# Patient Record
Sex: Male | Born: 1940
Health system: Southern US, Community
[De-identification: ages and names within clinical notes are randomized; demographics above are authoritative.]

## PROBLEM LIST (undated history)

## (undated) DIAGNOSIS — K529 Noninfective gastroenteritis and colitis, unspecified: Secondary | ICD-10-CM

## (undated) DIAGNOSIS — N400 Enlarged prostate without lower urinary tract symptoms: Secondary | ICD-10-CM

## (undated) DIAGNOSIS — Z85828 Personal history of other malignant neoplasm of skin: Secondary | ICD-10-CM

## (undated) DIAGNOSIS — G629 Polyneuropathy, unspecified: Secondary | ICD-10-CM

## (undated) DIAGNOSIS — K52831 Collagenous colitis: Secondary | ICD-10-CM

## (undated) DIAGNOSIS — R6521 Severe sepsis with septic shock: Secondary | ICD-10-CM

## (undated) DIAGNOSIS — N529 Male erectile dysfunction, unspecified: Secondary | ICD-10-CM

## (undated) DIAGNOSIS — G5603 Carpal tunnel syndrome, bilateral upper limbs: Secondary | ICD-10-CM

## (undated) DIAGNOSIS — Z973 Presence of spectacles and contact lenses: Secondary | ICD-10-CM

## (undated) DIAGNOSIS — N420 Calculus of prostate: Secondary | ICD-10-CM

## (undated) DIAGNOSIS — I7300 Raynaud's syndrome without gangrene: Secondary | ICD-10-CM

## (undated) DIAGNOSIS — Z860101 Personal history of adenomatous and serrated colon polyps: Secondary | ICD-10-CM

## (undated) DIAGNOSIS — R21 Rash and other nonspecific skin eruption: Secondary | ICD-10-CM

## (undated) DIAGNOSIS — R3911 Hesitancy of micturition: Secondary | ICD-10-CM

## (undated) DIAGNOSIS — Z8619 Personal history of other infectious and parasitic diseases: Secondary | ICD-10-CM

## (undated) DIAGNOSIS — R399 Unspecified symptoms and signs involving the genitourinary system: Secondary | ICD-10-CM

## (undated) DIAGNOSIS — D649 Anemia, unspecified: Secondary | ICD-10-CM

## (undated) DIAGNOSIS — J189 Pneumonia, unspecified organism: Secondary | ICD-10-CM

## (undated) DIAGNOSIS — J9600 Acute respiratory failure, unspecified whether with hypoxia or hypercapnia: Secondary | ICD-10-CM

## (undated) DIAGNOSIS — M1990 Unspecified osteoarthritis, unspecified site: Secondary | ICD-10-CM

## (undated) DIAGNOSIS — E119 Type 2 diabetes mellitus without complications: Secondary | ICD-10-CM

## (undated) DIAGNOSIS — Z86010 Personal history of colonic polyps: Secondary | ICD-10-CM

## (undated) DIAGNOSIS — A419 Sepsis, unspecified organism: Secondary | ICD-10-CM

## (undated) DIAGNOSIS — R234 Changes in skin texture: Secondary | ICD-10-CM

## (undated) DIAGNOSIS — Z8701 Personal history of pneumonia (recurrent): Secondary | ICD-10-CM

## (undated) DIAGNOSIS — C9110 Chronic lymphocytic leukemia of B-cell type not having achieved remission: Secondary | ICD-10-CM

## (undated) DIAGNOSIS — Z794 Long term (current) use of insulin: Secondary | ICD-10-CM

## (undated) DIAGNOSIS — D539 Nutritional anemia, unspecified: Secondary | ICD-10-CM

## (undated) DIAGNOSIS — D472 Monoclonal gammopathy: Secondary | ICD-10-CM

## (undated) DIAGNOSIS — D72829 Elevated white blood cell count, unspecified: Secondary | ICD-10-CM

## (undated) HISTORY — DX: Sepsis, unspecified organism: A41.9

## (undated) HISTORY — PX: INGUINAL HERNIA REPAIR: SUR1180

## (undated) HISTORY — DX: Sepsis, unspecified organism: R65.21

## (undated) HISTORY — PX: PENILE PROSTHESIS IMPLANT: SHX240

## (undated) HISTORY — PX: BONE MARROW BIOPSY: SHX199

## (undated) HISTORY — PX: CARDIOVASCULAR STRESS TEST: SHX262

## (undated) HISTORY — PX: TONSILLECTOMY: SUR1361

---

## 2007-08-08 HISTORY — PX: TRANSURETHRAL RESECTION OF PROSTATE: SHX73

## 2009-08-10 DIAGNOSIS — C4492 Squamous cell carcinoma of skin, unspecified: Secondary | ICD-10-CM

## 2009-08-10 HISTORY — DX: Squamous cell carcinoma of skin, unspecified: C44.92

## 2010-10-17 ENCOUNTER — Emergency Department (HOSPITAL_COMMUNITY): Payer: Medicare Other

## 2010-10-17 ENCOUNTER — Emergency Department (HOSPITAL_COMMUNITY)
Admission: EM | Admit: 2010-10-17 | Discharge: 2010-10-17 | Disposition: A | Discharge status: Home / Self Care | Payer: Medicare Other | Attending: Emergency Medicine | Admitting: Emergency Medicine

## 2010-10-17 DIAGNOSIS — S99929A Unspecified injury of unspecified foot, initial encounter: Secondary | ICD-10-CM | POA: Insufficient documentation

## 2010-10-17 DIAGNOSIS — M25473 Effusion, unspecified ankle: Secondary | ICD-10-CM | POA: Insufficient documentation

## 2010-10-17 DIAGNOSIS — E119 Type 2 diabetes mellitus without complications: Secondary | ICD-10-CM | POA: Insufficient documentation

## 2010-10-17 DIAGNOSIS — Y92009 Unspecified place in unspecified non-institutional (private) residence as the place of occurrence of the external cause: Secondary | ICD-10-CM | POA: Insufficient documentation

## 2010-10-17 DIAGNOSIS — M25579 Pain in unspecified ankle and joints of unspecified foot: Secondary | ICD-10-CM | POA: Insufficient documentation

## 2010-10-17 DIAGNOSIS — S8263XA Displaced fracture of lateral malleolus of unspecified fibula, initial encounter for closed fracture: Secondary | ICD-10-CM | POA: Insufficient documentation

## 2010-10-17 DIAGNOSIS — X500XXA Overexertion from strenuous movement or load, initial encounter: Secondary | ICD-10-CM | POA: Insufficient documentation

## 2010-10-17 DIAGNOSIS — S8990XA Unspecified injury of unspecified lower leg, initial encounter: Secondary | ICD-10-CM | POA: Insufficient documentation

## 2010-10-17 DIAGNOSIS — Z79899 Other long term (current) drug therapy: Secondary | ICD-10-CM | POA: Insufficient documentation

## 2010-10-17 DIAGNOSIS — M25476 Effusion, unspecified foot: Secondary | ICD-10-CM | POA: Insufficient documentation

## 2010-10-17 IMAGING — CR DG ANKLE COMPLETE 3+V*R*
3 series · 3 of 3 positions shown · non-contrast
Comparison: None.

CLINICAL DATA: Lateral ankle pain secondary to a fall today.

RIGHT ANKLE - COMPLETE 3+ VIEW

[t ankle joint ap right]
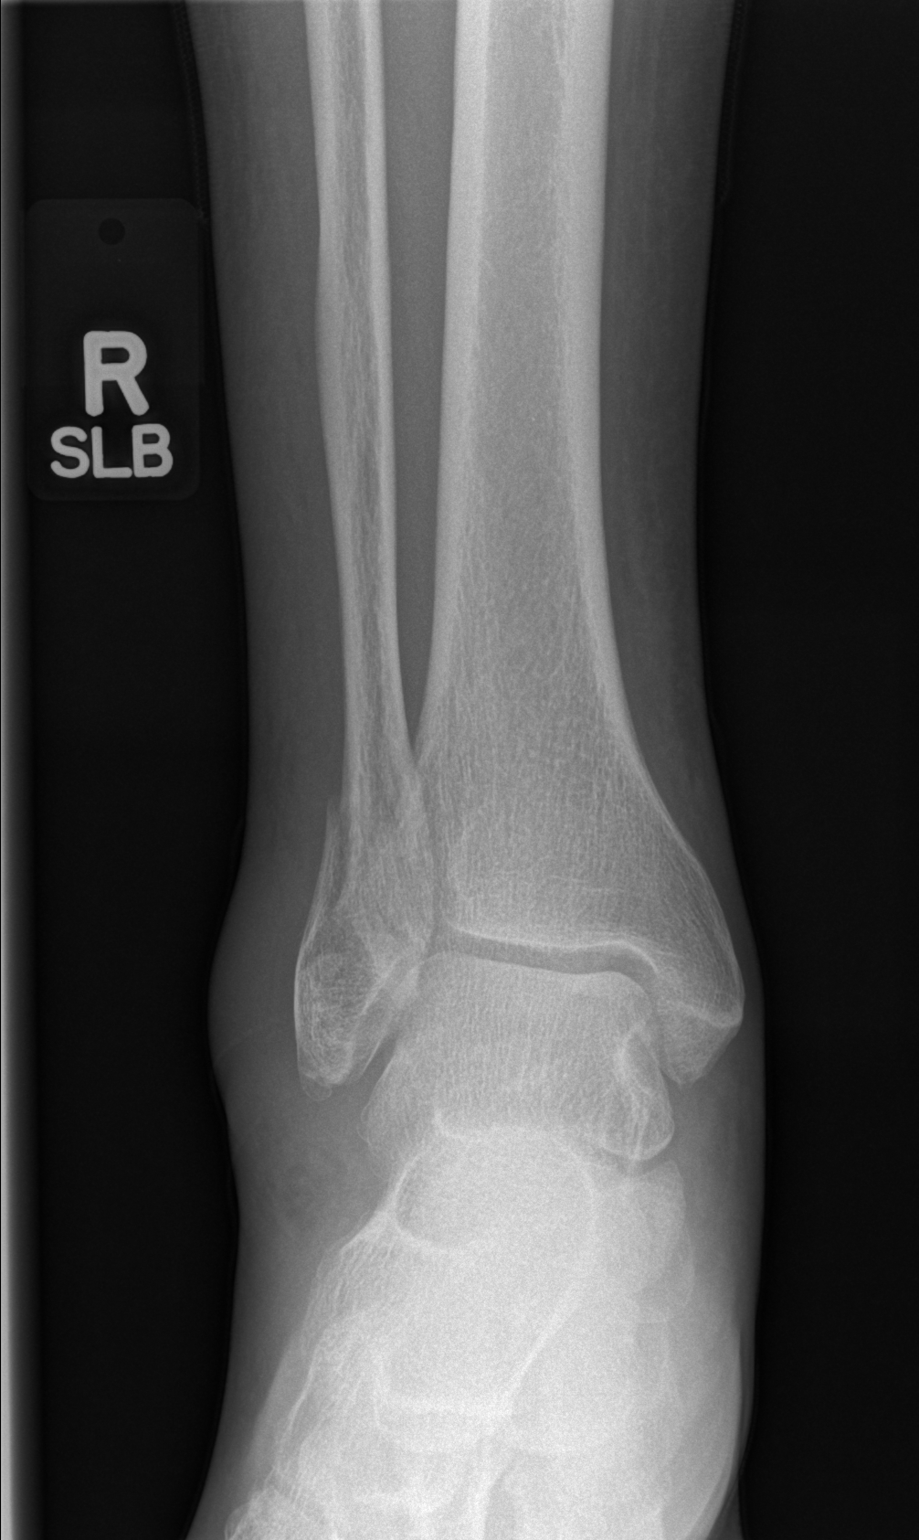

[t ankle joint oblique right]
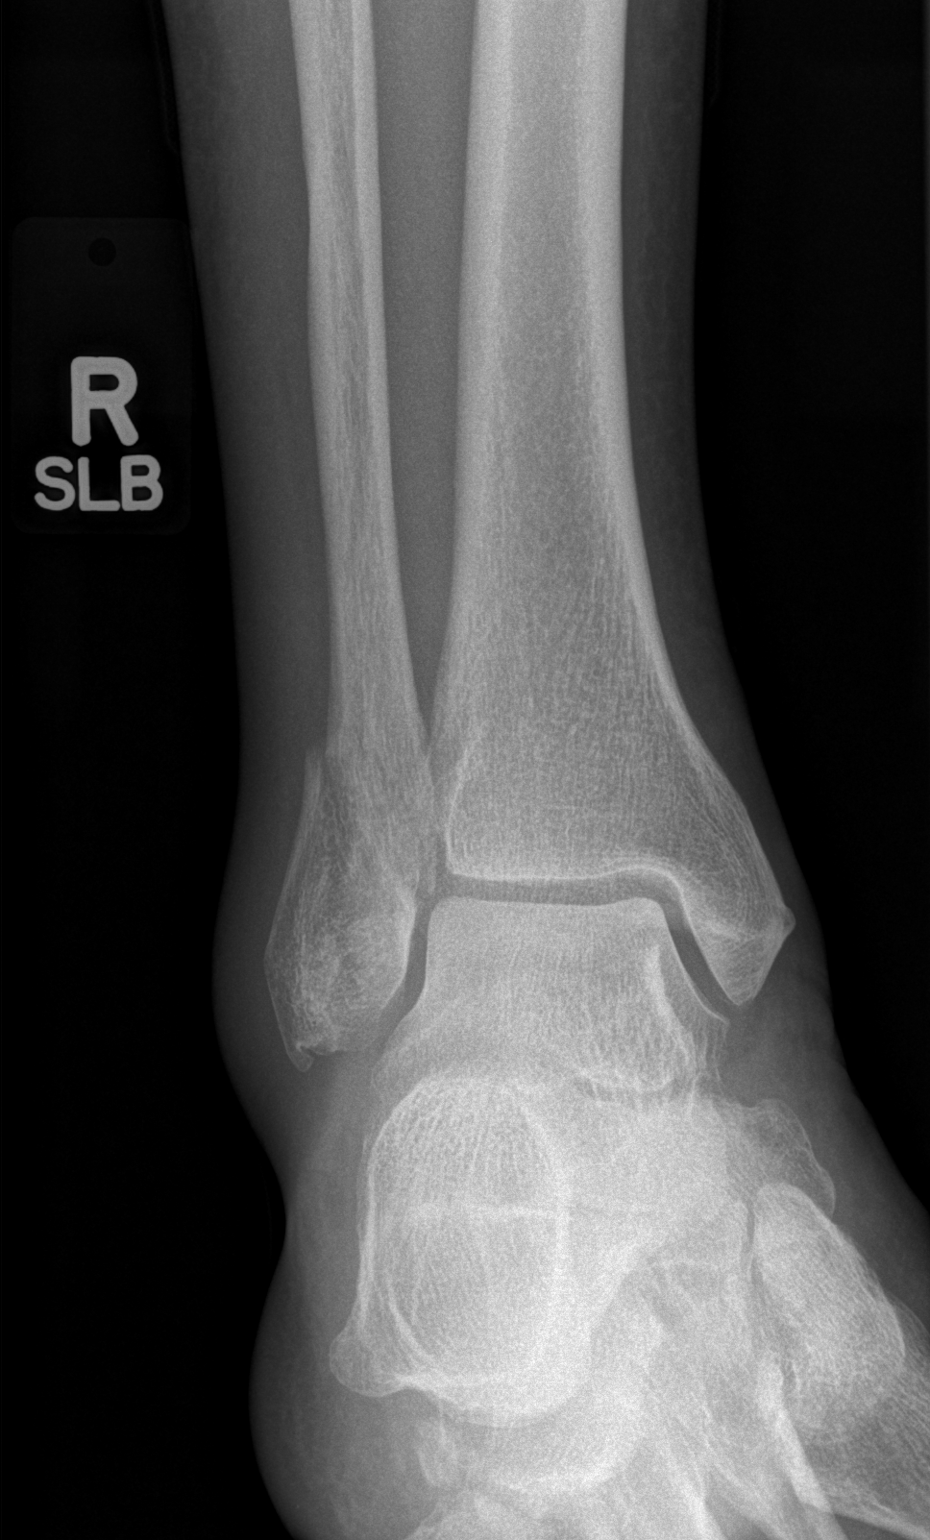

[t ankle joint lat right]
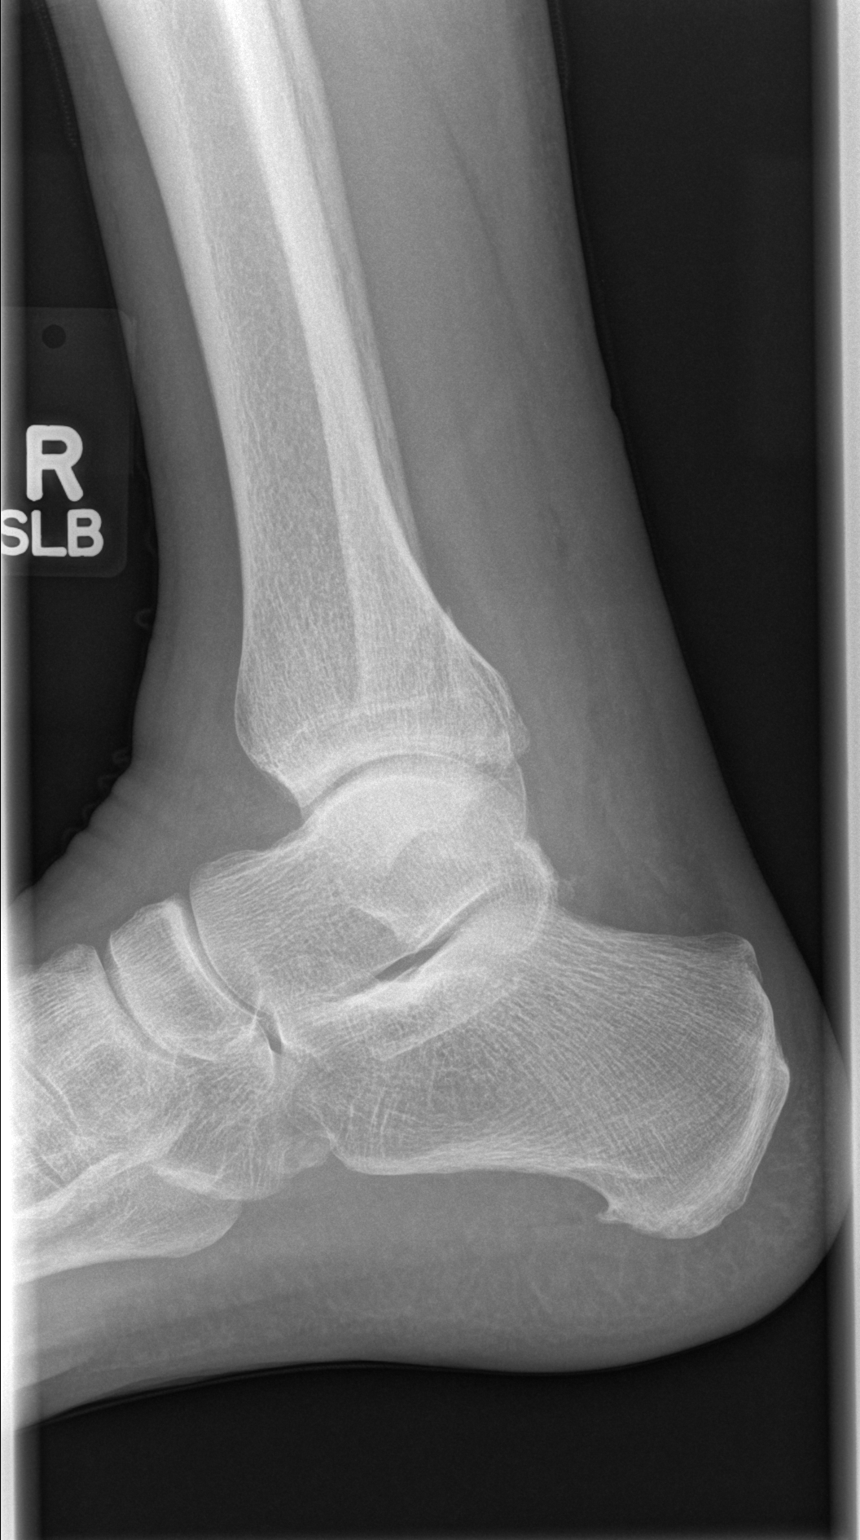

[3 of 3 positions shown; findings below may reference images not displayed]

FINDINGS: There is a slightly displaced spiral fracture of the
distal fibula.  There is an ankle effusion with soft tissue
swelling.  The distal tibia is intact.
IMPRESSION: Slightly displaced fracture of the lateral malleolus.

## 2011-08-29 DIAGNOSIS — J309 Allergic rhinitis, unspecified: Secondary | ICD-10-CM | POA: Insufficient documentation

## 2011-12-08 DIAGNOSIS — D099 Carcinoma in situ, unspecified: Secondary | ICD-10-CM

## 2011-12-08 HISTORY — DX: Carcinoma in situ, unspecified: D09.9

## 2012-03-25 ENCOUNTER — Ambulatory Visit (INDEPENDENT_AMBULATORY_CARE_PROVIDER_SITE_OTHER): Payer: Medicare Other | Admitting: Family Medicine

## 2012-03-25 VITALS — BP 120/70 | HR 72 | Temp 98.6°F | Resp 16 | Ht 69.5 in | Wt 176.0 lb

## 2012-03-25 DIAGNOSIS — T1590XA Foreign body on external eye, part unspecified, unspecified eye, initial encounter: Secondary | ICD-10-CM

## 2012-03-25 DIAGNOSIS — S0500XA Injury of conjunctiva and corneal abrasion without foreign body, unspecified eye, initial encounter: Secondary | ICD-10-CM

## 2012-03-25 MED ORDER — OFLOXACIN 0.3 % OP SOLN: OPHTHALMIC | Status: DC

## 2012-03-25 NOTE — Patient Instructions (Addendum)
If worse recheck

## 2012-03-25 NOTE — Progress Notes (Signed)
Subjective: Patient had a foreign body in his eye about 4 days ago discontinued bother him he thinks he got an eyelash in there. His wife was. Yesterday. This a difficult time for him. Left eye keeps hurting so he came in today.  Objective: He is grieving his wife's death. We had a long talk about that.  His eyes are PERRLA. Fundi benign. Eyes are both a little injected. I mentioned that's from is grieving. He is limited was everted. Eyes were carefully examined. No foreign body could be seen. Extensively irrigated the. As fluorescein stain was done. There is a small abrasion of the conjunctiva laterally.  Assessment: Abrasion left conjunctiva. Status post probable foreign body in the eye Grief  Plan: Ofloxacin eyedrops. If symptoms persist he is to return for recheck return to the eye Dr.

## 2012-06-14 ENCOUNTER — Ambulatory Visit (INDEPENDENT_AMBULATORY_CARE_PROVIDER_SITE_OTHER): Payer: Medicare Other | Admitting: Family Medicine

## 2012-06-14 VITALS — BP 127/66 | HR 67 | Temp 97.6°F | Resp 16 | Ht 69.0 in | Wt 176.0 lb

## 2012-06-14 DIAGNOSIS — J029 Acute pharyngitis, unspecified: Secondary | ICD-10-CM

## 2012-06-14 MED ORDER — AMOXICILLIN 875 MG PO TABS: 875.0000 mg | ORAL_TABLET | Freq: Two times a day (BID) | ORAL | Status: DC

## 2012-06-14 NOTE — Patient Instructions (Addendum)

## 2012-06-14 NOTE — Progress Notes (Signed)
@[email or web address] @   Patient ID: Tommy Peterson MRN: [id number], DOB: [birth year], 71 y.o. Date of Encounter: 06/14/2012, 1:04 PM  Primary Physician: No primary provider on file.  Chief Complaint:  Chief Complaint  Patient presents with  . Sore Throat    Has been sick x 8 days but the ST is lingering    HPI: 71 y.o. year old male presents with 5 day history of sore throat. Subjective fever and chills. No cough, congestion, rhinorrhea, sinus pressure, otalgia, or headache. Normal hearing. No GI complaints. Able to swallow saliva, but hurts to do so. Decreased appetite secondary to sore throat.   Past Medical History  Diagnosis Date  . Basal cell cancer   . Diabetes mellitus without complication      Home Meds: Prior to Admission medications   Medication Sig Start Date End Date Taking? Authorizing Provider  citalopram (CELEXA) 40 MG tablet Take 20 mg by mouth daily.    Yes Historical Provider, MD  glipiZIDE (GLUCOTROL) 10 MG tablet Take 10 mg by mouth 2 (two) times daily.   Yes Historical Provider, MD  losartan (COZAAR) 100 MG tablet Take 100 mg by mouth daily.   Yes Historical Provider, MD  metFORMIN (GLUCOPHAGE) 500 MG tablet Take by mouth. 250 mg in the a.m, 500 mg at night   Yes Historical Provider, MD  simvastatin (ZOCOR) 40 MG tablet Take 40 mg by mouth every evening.   Yes Historical Provider, MD    Allergies: No Known Allergies  History   Social History  . Marital Status: Married    Spouse Name: N/A    Number of Children: N/A  . Years of Education: N/A   Occupational History  . Not on file.   Social History Main Topics  . Smoking status: Never Smoker   . Smokeless tobacco: Not on file  . Alcohol Use: No  . Drug Use: No  . Sexually Active: Not on file   Other Topics Concern  . Not on file   Social History Narrative  . No narrative on file     Review of Systems: Constitutional: negative for chills,night sweats or weight changes HEENT: see  above Cardiovascular: negative for chest pain or palpitations Respiratory: negative for hemoptysis, wheezing, or shortness of breath Abdominal: negative for abdominal pain, nausea, vomiting or diarrhea Dermatological: negative for rash Neurologic: negative for headache   Physical Exam: Blood pressure 127/66, pulse 67, temperature 97.6 F (36.4 C), resp. rate 16, height 5\' 9"  (1.753 m), weight 176 lb (79.833 kg), SpO2 96.00%., Body mass index is 25.99 kg/(m^2). General: Well developed, well nourished, in no acute distress. Head: Normocephalic, atraumatic, eyes without discharge, sclera non-icteric, nares are patent. Bilateral auditory canals clear, TM's are without perforation, pearly grey with reflective cone of light bilaterally. No sinus TTP. Oral cavity moist, dentition normal. Posterior pharynx with post nasal drip and mild erythema. No peritonsillar abscess or tonsillar exudate. Neck: Supple. No thyromegaly. Full ROM. No lymphadenopathy. Lungs: Clear bilaterally to auscultation without wheezes, rales, or rhonchi. Breathing is unlabored. Heart: RRR with S1 S2. No murmurs, rubs, or gallops appreciated. Abdomen: Soft, non-tender, non-distended with normoactive bowel sounds. No hepatomegaly. No rebound/guarding. No obvious abdominal masses. Msk:  Strength and tone normal for age. Extremities: No clubbing or cyanosis. No edema. Neuro: Alert and oriented X 3. Moves all extremities spontaneously. CNII-XII grossly in tact. Psych:  Responds to questions appropriately with a normal affect.   Labs:   ASSESSMENT AND PLAN:  71 y.o. year old male  with  - -Tylenol/Motrin prn -Rest/fluids -RTC precautions -RTC 3-5 days if no improvement  Signed, Elvina Sidle, MD 06/14/2012 1:04 PM

## 2012-06-16 LAB — CULTURE, GROUP A STREP: Organism ID, Bacteria: NORMAL

## 2012-06-24 ENCOUNTER — Ambulatory Visit (INDEPENDENT_AMBULATORY_CARE_PROVIDER_SITE_OTHER): Payer: Medicare Other | Admitting: Family Medicine

## 2012-06-24 VITALS — BP 112/78 | HR 67 | Temp 98.1°F | Resp 16 | Ht 69.5 in | Wt 178.0 lb

## 2012-06-24 DIAGNOSIS — J029 Acute pharyngitis, unspecified: Secondary | ICD-10-CM

## 2012-06-24 DIAGNOSIS — J329 Chronic sinusitis, unspecified: Secondary | ICD-10-CM

## 2012-06-24 DIAGNOSIS — R0982 Postnasal drip: Secondary | ICD-10-CM

## 2012-06-24 MED ORDER — FLUTICASONE PROPIONATE 50 MCG/ACT NA SUSP: 2.0000 | Freq: Every day | NASAL | Status: DC

## 2012-06-24 NOTE — Progress Notes (Signed)
Subjective: Patient was here 10 days ago and treated by Dr. Elbert Ewings for a sore throat. He was given amoxicillin 875 twice daily. He has completed the medicine but continues to have symptoms. He has a slight cough. He does need some. He does not smoke. No fever.  Objective: Wears bilateral hearing aids. His TMs are normal on the left, some central thinning of the drum on the right. Throat mild erythema down the midline. Strep screen and culture were taken. Neck supple with small nodes. Chest is clear to auscultation. He has a little tenderness in the back of his neck. Chest is clear to auscultation.  Assessment: Pharyngitis  Plan: Strep screen and throat culture Results for orders placed in visit on 06/24/12  POCT RAPID STREP A (OFFICE)      Component Value Range   Rapid Strep A Screen Negative  Negative   Prolonged pharyngitis, probably either from allergy or a viral infection. The throat culture is pending. Will give him some fluticasone nose spray. If that does not help will probably want to treat him with a taper of prednisone. Might consider a Z-Pak also that point but this is not seem to be a bacterial infection.

## 2012-06-24 NOTE — Patient Instructions (Signed)
Fluids  Nose Spray  Let me know if not improving by Friday

## 2012-06-26 LAB — CULTURE, GROUP A STREP

## 2012-07-18 DIAGNOSIS — F3342 Major depressive disorder, recurrent, in full remission: Secondary | ICD-10-CM | POA: Insufficient documentation

## 2013-06-24 ENCOUNTER — Ambulatory Visit (INDEPENDENT_AMBULATORY_CARE_PROVIDER_SITE_OTHER): Payer: Medicare Other | Admitting: Internal Medicine

## 2013-06-24 VITALS — BP 126/78 | HR 72 | Temp 98.2°F | Resp 17 | Ht 69.5 in | Wt 176.0 lb

## 2013-06-24 DIAGNOSIS — J029 Acute pharyngitis, unspecified: Secondary | ICD-10-CM

## 2013-06-24 DIAGNOSIS — E119 Type 2 diabetes mellitus without complications: Secondary | ICD-10-CM

## 2013-06-24 MED ORDER — AZITHROMYCIN 250 MG PO TABS: ORAL_TABLET | ORAL | Status: DC

## 2013-06-24 NOTE — Patient Instructions (Signed)
Fever, Adult A fever is a higher than normal body temperature. In an adult, an oral temperature around 98.6 F (37 C) is considered normal. A temperature of 100.4 F (38 C) or higher is generally considered a fever. Mild or moderate fevers generally have no long-term effects and often do not require treatment. Extreme fever (greater than or equal to 106 F or 41.1 C) can cause seizures. The sweating that may occur with repeated or prolonged fever may cause dehydration. Elderly people can develop confusion during a fever. A measured temperature can vary with:  Age.  Time of day.  Method of measurement (mouth, underarm, rectal, or ear). The fever is confirmed by taking a temperature with a thermometer. Temperatures can be taken different ways. Some methods are accurate and some are not.  An oral temperature is used most commonly. Electronic thermometers are fast and accurate.  An ear temperature will only be accurate if the thermometer is positioned as recommended by the manufacturer.  A rectal temperature is accurate and done for those adults who have a condition where an oral temperature cannot be taken.  An underarm (axillary) temperature is not accurate and not recommended. Fever is a symptom, not a disease.  CAUSES   Infections commonly cause fever.  Some noninfectious causes for fever include:  Some arthritis conditions.  Some thyroid or adrenal gland conditions.  Some immune system conditions.  Some types of cancer.  A medicine reaction.  High doses of certain street drugs such as methamphetamine.  Dehydration.  Exposure to high outside or room temperatures.  Occasionally, the source of a fever cannot be determined. This is sometimes called a "fever of unknown origin" (FUO).  Some situations may lead to a temporary rise in body temperature that may go away on its own. Examples are:  Childbirth.  Surgery.  Intense exercise. HOME CARE INSTRUCTIONS   Take  appropriate medicines for fever. Follow dosing instructions carefully. If you use acetaminophen to reduce the fever, be careful to avoid taking other medicines that also contain acetaminophen. Do not take aspirin for a fever if you are younger than age 19. There is an association with Reye's syndrome. Reye's syndrome is a rare but potentially deadly disease.  If an infection is present and antibiotics have been prescribed, take them as directed. Finish them even if you start to feel better.  Rest as needed.  Maintain an adequate fluid intake. To prevent dehydration during an illness with prolonged or recurrent fever, you may need to drink extra fluid.Drink enough fluids to keep your urine clear or pale yellow.  Sponging or bathing with room temperature water may help reduce body temperature. Do not use ice water or alcohol sponge baths.  Dress comfortably, but do not over-bundle. SEEK MEDICAL CARE IF:   You are unable to keep fluids down.  You develop vomiting or diarrhea.  You are not feeling at least partly better after 3 days.  You develop new symptoms or problems. SEEK IMMEDIATE MEDICAL CARE IF:   You have shortness of breath or trouble breathing.  You develop excessive weakness.  You are dizzy or you faint.  You are extremely thirsty or you are making little or no urine.  You develop new pain that was not there before (such as in the head, neck, chest, back, or abdomen).  You have persistant vomiting and diarrhea for more than 1 to 2 days.  You develop a stiff neck or your eyes become sensitive to light.  You develop a   skin rash.  You have a fever or persistent symptoms for more than 2 to 3 days.  You have a fever and your symptoms suddenly get worse. MAKE SURE YOU:   Understand these instructions.  Will watch your condition.  Will get help right away if you are not doing well or get worse. Document Released: 01/17/2001 Document Revised: 10/16/2011 Document  Reviewed: 05/25/2011 [facility] Patient Information 2014 [place], [place]. Viral and Bacterial Pharyngitis Pharyngitis is soreness (inflammation) or infection of the pharynx. It is also called a sore throat. CAUSES  Most sore throats are caused by viruses and are part of a cold. However, some sore throats are caused by strep and other bacteria. Sore throats can also be caused by post nasal drip from draining sinuses, allergies and sometimes from sleeping with an open mouth. Infectious sore throats can be spread from person to person by coughing, sneezing and sharing cups or eating utensils. TREATMENT  Sore throats that are viral usually last 3-4 days. Viral illness will get better without medications (antibiotics). Strep throat and other bacterial infections will usually begin to get better about 24-48 hours after you begin to take antibiotics. HOME CARE INSTRUCTIONS   If the caregiver feels there is a bacterial infection or if there is a positive strep test, they will prescribe an antibiotic. The full course of antibiotics must be taken. If the full course of antibiotic is not taken, you or your child may become ill again. If you or your child has strep throat and do not finish all of the medication, serious heart or kidney diseases may develop.  Drink enough water and fluids to keep your urine clear or pale yellow.  Only take over-the-counter or prescription medicines for pain, discomfort or fever as directed by your caregiver.  Get lots of rest.  Gargle with salt water ( tsp. of salt in a glass of water) as often as every 1-2 hours as you need for comfort.  Hard candies may soothe the throat if individual is not at risk for choking. Throat sprays or lozenges may also be used. SEEK MEDICAL CARE IF:   Large, tender lumps in the neck develop.  A rash develops.  Green, yellow-brown or bloody sputum is coughed up.  Your baby is older than 3 months with a rectal temperature of 100.5 F (38.1  C) or higher for more than 1 day. SEEK IMMEDIATE MEDICAL CARE IF:   A stiff neck develops.  You or your child are drooling or unable to swallow liquids.  You or your child are vomiting, unable to keep medications or liquids down.  You or your child has severe pain, unrelieved with recommended medications.  You or your child are having difficulty breathing (not due to stuffy nose).  You or your child are unable to fully open your mouth.  You or your child develop redness, swelling, or severe pain anywhere on the neck.  You have a fever.  Your baby is older than 3 months with a rectal temperature of 102 F (38.9 C) or higher.  Your baby is 49 months old or younger with a rectal temperature of 100.4 F (38 C) or higher. MAKE SURE YOU:   Understand these instructions.  Will watch your condition.  Will get help right away if you are not doing well or get worse. Document Released: 07/24/2005 Document Revised: 10/16/2011 Document Reviewed: 10/21/2007 [facility] Patient Information 2014 [place], [place].

## 2013-06-24 NOTE — Progress Notes (Deleted)
  Subjective:    Patient ID: Tommy Peterson, male    DOB: 11-30-1940, 72 y.o.   MRN: [id number]  HPI The patient present to the office complaining of Congestion, sore throat and coughing up just a little sputum. Feel his throat irritated. Has the Sx for 5 days.    Review of Systems     Objective:   Physical Exam        Assessment & Plan:

## 2013-06-24 NOTE — Progress Notes (Signed)
  Subjective:    Patient ID: Tommy Peterson, male    DOB: [birth year], 72 y.o.   MRN: [id number]  HPI Has st, daughter 9 sick with sore throat, fever, cough, body aches. He has no other sxs. Diabetes is controlled.   Review of Systems     Objective:   Physical Exam  Vitals reviewed. Constitutional: He is oriented to person, place, and time. He appears well-developed and well-nourished. No distress.  HENT:  Head: Normocephalic.  Right Ear: External ear normal.  Left Ear: External ear normal.  Nose: Nose normal.  Mouth/Throat: Posterior oropharyngeal erythema present.  Neck: Normal range of motion. Neck supple. No thyromegaly present.  Cardiovascular: Normal rate, regular rhythm and normal heart sounds.   Pulmonary/Chest: Effort normal and breath sounds normal.  Lymphadenopathy:    He has cervical adenopathy.  Neurological: He is alert and oriented to person, place, and time. No cranial nerve deficit. He exhibits normal muscle tone. Coordination normal.  Psychiatric: He has a normal mood and affect.          Assessment & Plan:  Pharyngitis Zpak

## 2013-06-28 ENCOUNTER — Ambulatory Visit (INDEPENDENT_AMBULATORY_CARE_PROVIDER_SITE_OTHER): Payer: Medicare Other | Admitting: Family Medicine

## 2013-06-28 ENCOUNTER — Ambulatory Visit: Payer: Medicare Other

## 2013-06-28 VITALS — BP 142/72 | HR 95 | Temp 100.0°F | Resp 17 | Ht 70.0 in | Wt 175.6 lb

## 2013-06-28 DIAGNOSIS — R509 Fever, unspecified: Secondary | ICD-10-CM

## 2013-06-28 DIAGNOSIS — R059 Cough, unspecified: Secondary | ICD-10-CM

## 2013-06-28 DIAGNOSIS — R05 Cough: Secondary | ICD-10-CM

## 2013-06-28 DIAGNOSIS — J189 Pneumonia, unspecified organism: Secondary | ICD-10-CM

## 2013-06-28 LAB — POCT CBC
Hemoglobin: 12.1 g/dL — AB (ref 14.1–18.1)
MCH, POC: 31.5 pg — AB (ref 27–31.2)
MCV: 102.4 fL — AB (ref 80–97)
MID (cbc): 0.9 (ref 0–0.9)
MPV: 8.6 fL (ref 0–99.8)
RBC: 3.84 M/uL — AB (ref 4.69–6.13)
RDW, POC: 16.1 %
WBC: 11.9 10*3/uL — AB (ref 4.6–10.2)

## 2013-06-28 LAB — POCT INFLUENZA A/B
Influenza A, POC: NEGATIVE
Influenza B, POC: NEGATIVE

## 2013-06-28 IMAGING — CR DG CHEST 2V
2 series · 2 of 2 positions shown · non-contrast
Comparison: None.

CLINICAL DATA: Fever, cough.

EXAM:
CHEST  2 VIEW

[PA]
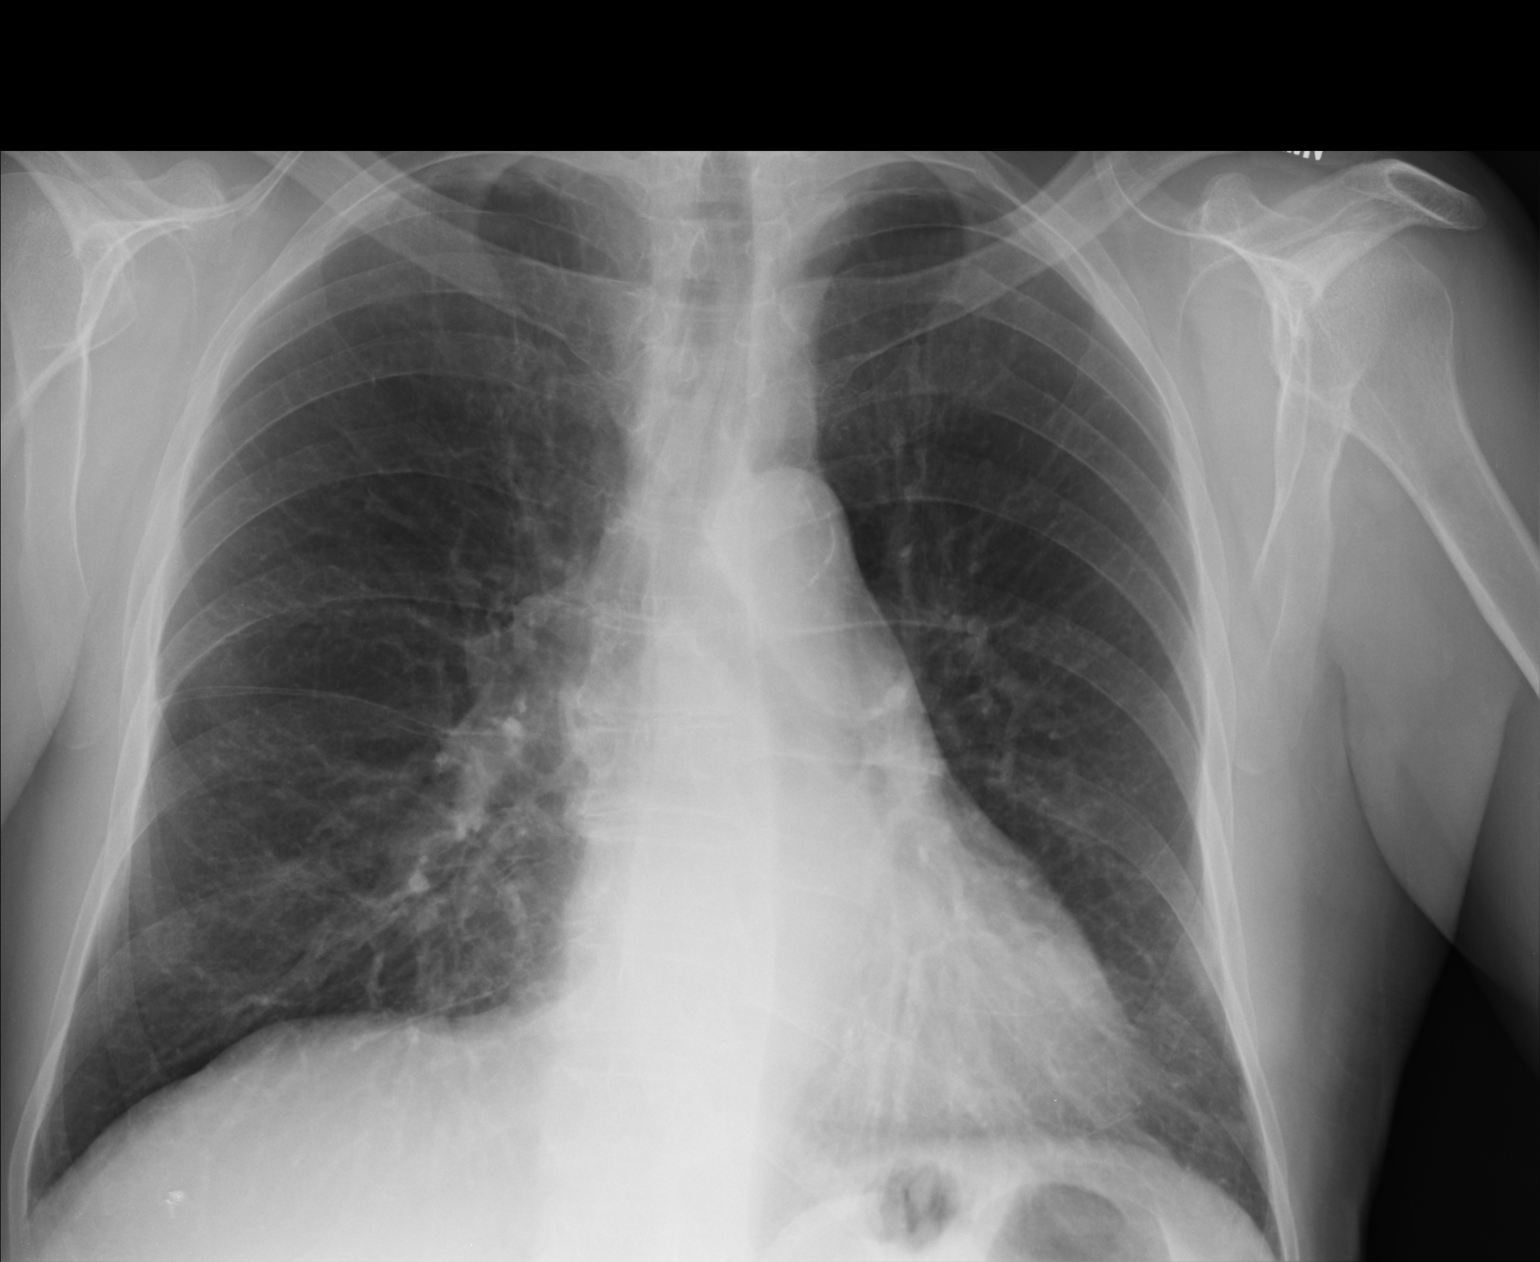

[lateral]
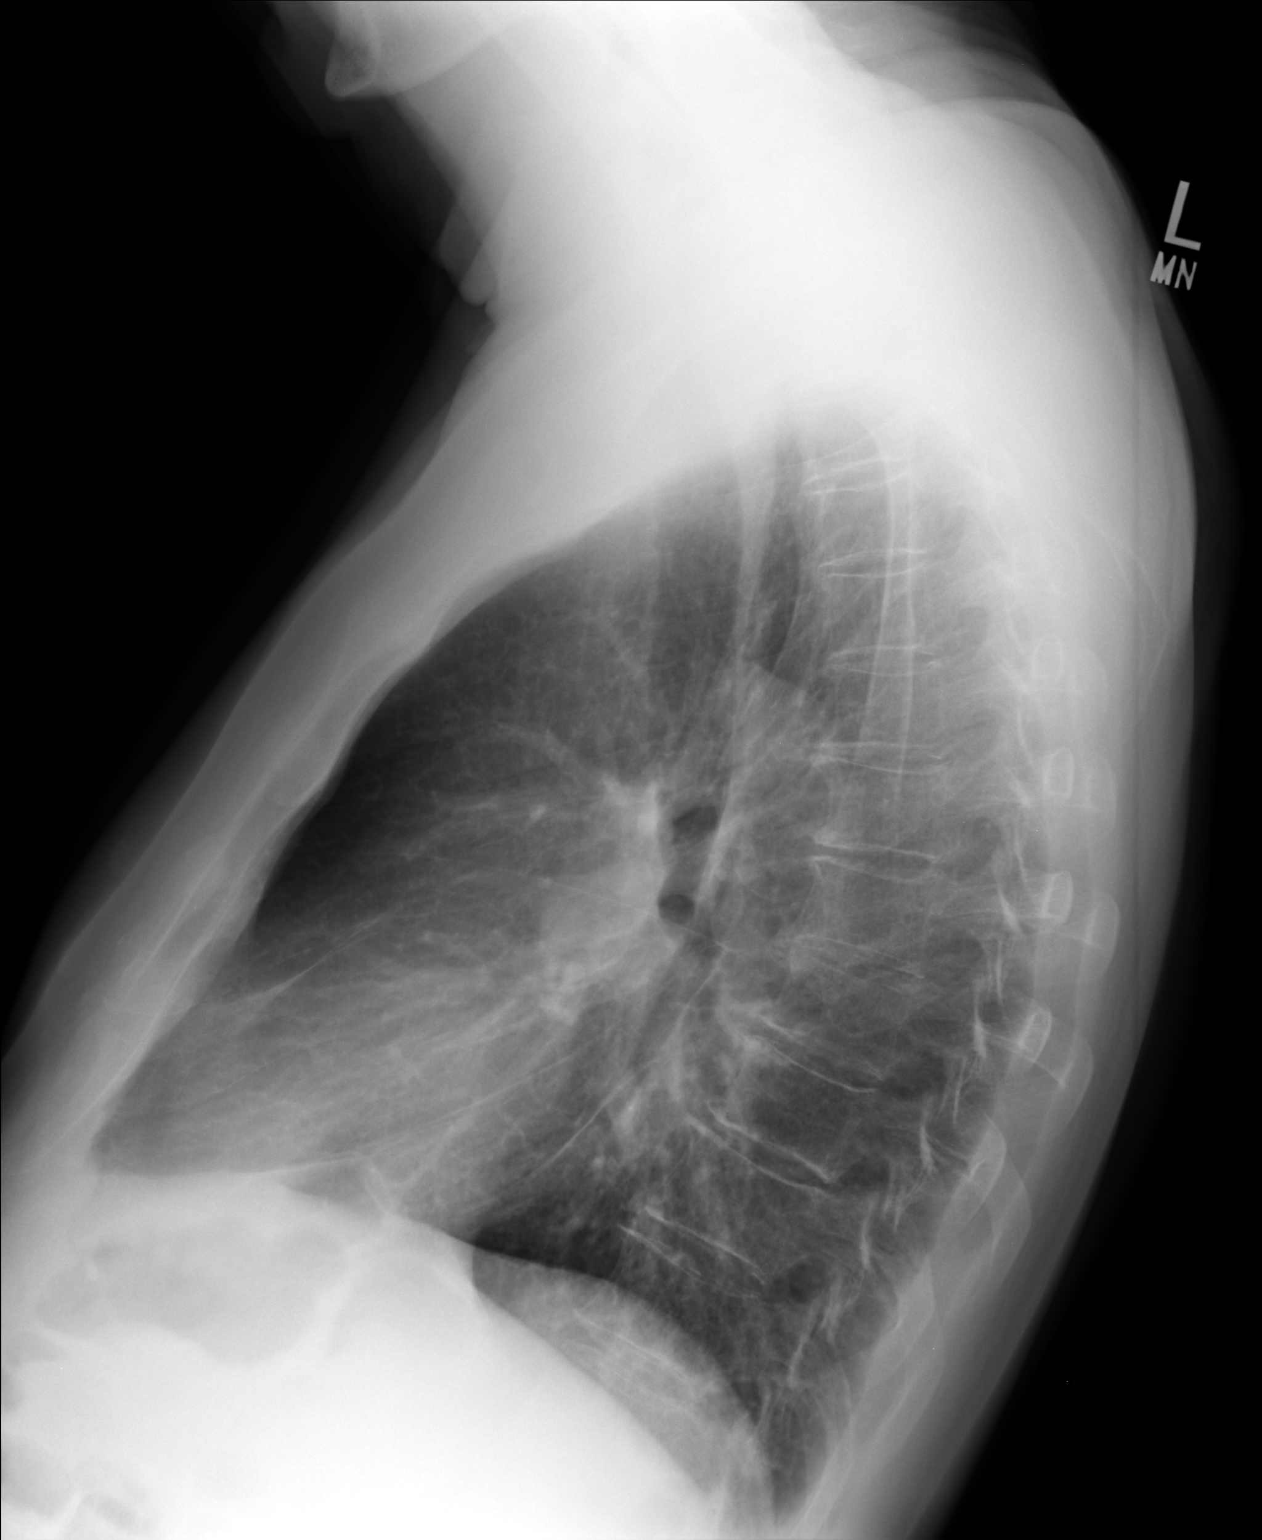

[2 of 2 positions shown; findings below may reference images not displayed]

FINDINGS: The heart size and mediastinal contours are within normal limits.
Both lungs are clear. The visualized skeletal structures are
unremarkable.
IMPRESSION: No active cardiopulmonary disease.

## 2013-06-28 MED ORDER — LEVOFLOXACIN 500 MG PO TABS: 500.0000 mg | ORAL_TABLET | Freq: Every day | ORAL | Status: DC

## 2013-06-28 NOTE — Progress Notes (Addendum)
Subjective:   This chart was scribed for Theda Belfast, by Ladona Ridgel Day, ED scribe. This patient was seen in room 2 and the patient's care was started at 1025 AM.   Patient ID: Tommy Peterson, male    DOB: [birth year], 72 y.o.   MRN: [id number]  HPI Tommy Peterson is a 72 y.o. male PCP: No primary provider on file. - PCP is Dr.Thomas Lady Gary in [place]  Today Tommy Peterson presents to the [facility] clinic for a recheck of worsened URI after he was seen 4 days ago by Dr. Perrin Maltese, dx w/pharyngitis and placed on Z-pak.   From his last visit, he had been sick 4 days prior (8 days total since onset) w/a sore throat, dry cough, congestion, no myalgias and had no fever at that time. Felt better first few days post visit, but yesterday worse w/cough and began w/body aches. Fever yesterday 99.5 F and subjective fever past couple of days. His daughter is sick w/similar symptoms.  He took Nyquil once at home to help him sleep, no other OTC medicines. He denies SOB.  Month ago he had the flu and pneumonia vaccine.   Has been diagnosed with heart murmur in past, but no specific recommendations.   Patient Active Problem List   Diagnosis Date Noted  . Diabetes 06/24/2013   Past Medical History  Diagnosis Date  . Basal cell cancer   . Diabetes mellitus without complication   . Cataract    Past Surgical History  Procedure Laterality Date  . Hernia repair    . Prostate surgery    . Eye surgery     No Known Allergies Prior to Admission medications   Medication Sig Start Date End Date Taking? Authorizing Provider  azithromycin (ZITHROMAX) 250 MG tablet Use as directed 06/24/13  Yes Jonita Albee, MD  citalopram (CELEXA) 40 MG tablet Take 20 mg by mouth daily.    Yes Historical Provider, MD  fluticasone (FLONASE) 50 MCG/ACT nasal spray Place 2 sprays into the nose daily. 06/24/12  Yes Peyton Najjar, MD  glipiZIDE (GLUCOTROL) 10 MG tablet Take 10 mg by mouth 2 (two) times daily.   Yes  Historical Provider, MD  losartan (COZAAR) 100 MG tablet Take 100 mg by mouth daily.   Yes Historical Provider, MD  metFORMIN (GLUCOPHAGE) 500 MG tablet Take by mouth. 250 mg in the a.m, 500 mg at night   Yes Historical Provider, MD  pioglitazone (ACTOS) 15 MG tablet Take 15 mg by mouth daily.   Yes Historical Provider, MD  simvastatin (ZOCOR) 40 MG tablet Take 40 mg by mouth every evening.   Yes Historical Provider, MD   History   Social History  . Marital Status: Married    Spouse Name: N/A    Number of Children: N/A  . Years of Education: N/A   Occupational History  . Not on file.   Social History Main Topics  . Smoking status: Never Smoker   . Smokeless tobacco: Not on file  . Alcohol Use: No  . Drug Use: No  . Sexual Activity: No   Other Topics Concern  . Not on file   Social History Narrative  . No narrative on file     Review of Systems  Constitutional: Positive for fever and chills.  HENT: Positive for congestion, rhinorrhea, sinus pressure and sore throat. Negative for ear pain.   Respiratory: Positive for cough. Negative for shortness of breath.   Cardiovascular: Negative for chest pain.  Gastrointestinal:  Negative for vomiting, abdominal pain and diarrhea.  Musculoskeletal: Positive for myalgias.       Objective:   Physical Exam  Vitals reviewed. Constitutional: He is oriented to person, place, and time. He appears well-developed and well-nourished.  HENT:  Head: Normocephalic and atraumatic.  Right Ear: Tympanic membrane, external ear and ear canal normal.  Left Ear: Tympanic membrane, external ear and ear canal normal.  Nose: No rhinorrhea.  Mouth/Throat: Oropharynx is clear and moist and mucous membranes are normal. No oropharyngeal exudate or posterior oropharyngeal erythema.  Sinuses non-tender. Clear nasal discharge.   Eyes: Conjunctivae are normal. Pupils are equal, round, and reactive to light.  Neck: Neck supple.  Cardiovascular: Normal rate,  regular rhythm and intact distal pulses.   Murmur (Systolic murmur 2/6 at left upper sternum border. ) heard. Pulmonary/Chest: Effort normal and breath sounds normal. No respiratory distress. He has no wheezes. He has no rhonchi. He has no rales.  No respiratory distress. No retractions.   Abdominal: Soft. There is no tenderness.  Lymphadenopathy:    He has no cervical adenopathy.  Neurological: He is alert and oriented to person, place, and time.  Skin: Skin is warm and dry. No rash noted.  Psychiatric: He has a normal mood and affect. His behavior is normal.   Filed Vitals:   06/28/13 0948  BP: 142/72  Pulse: 95  Temp: 100 F (37.8 C)  TempSrc: Oral  Resp: 17  Height: 5\' 10"  (1.778 m)  Weight: 175 lb 9.6 oz (79.652 kg)  SpO2: 95%   Results for orders placed in visit on 06/28/13  POCT CBC      Result Value Range   WBC 11.9 (*) 4.6 - 10.2 K/uL   Lymph, poc 3.9 (*) 0.6 - 3.4   POC LYMPH PERCENT 32.4  10 - 50 %L   MID (cbc) 0.9  0 - 0.9   POC MID % 7.3  0 - 12 %M   POC Granulocyte 7.2 (*) 2 - 6.9   Granulocyte percent 60.3  37 - 80 %G   RBC 3.84 (*) 4.69 - 6.13 M/uL   Hemoglobin 12.1 (*) 14.1 - 18.1 g/dL   HCT, POC 16.1 (*) 09.6 - 53.7 %   MCV 102.4 (*) 80 - 97 fL   MCH, POC 31.5 (*) 27 - 31.2 pg   MCHC 30.8 (*) 31.8 - 35.4 g/dL   RDW, POC 04.5     Platelet Count, POC 189  142 - 424 K/uL   MPV 8.6  0 - 99.8 fL  POCT INFLUENZA A/B      Result Value Range   Influenza A, POC Negative     Influenza B, POC Negative     [facility] reading (PRIMARY) by  Dr. Neva Seat: CXR: RML vs RLL infiltrate      Assessment & Plan:   Tommy Peterson is a 72 y.o. male Fever, unspecified - Plan: POCT CBC, POCT Influenza A/B, DG Chest 2 View, levofloxacin (LEVAQUIN) 500 MG tablet  Cough - Plan: POCT CBC, POCT Influenza A/B, DG Chest 2 View, levofloxacin (LEVAQUIN) 500 MG tablet  Pneumonia - Plan: levofloxacin (LEVAQUIN) 500 MG tablet  Suspected inititla URI now with RML pneumonia after 5  days of azithro. Start LEvaquin. Discussed risks of QT prolongation and hypoglycemia wih precautions as below. Will decr celxa to 1/2 of usual dose (1/4 tablet QD), and hypoglycemic precautions. Has hydrocodone syrup at home if neededQHS prn, recheck in 3 days, RTC/ER precautions given for sooner if needed.  Meds ordered this encounter  Medications  . levofloxacin (LEVAQUIN) 500 MG tablet    Sig: Take 1 tablet (500 mg total) by mouth daily.    Dispense:  10 tablet    Refill:  0   Patient Instructions  You appear to have a pneumonia. As you have already taken the zpak, will need something stronger. Start Levaquin once per day. Decrease celexa to 1/4 tablet while taking Levaquin due to risk of heart arrythmia, and if you do have any lightheadedness, heart racing, or feeling worse - go to the ER.  Also be cautious of low blood sugar symptoms as below as Levaquin can make this more likely with one of your diabetes medicines.  Saline nasal spray atleast 4 times per day, over the counter mucinex or mucinex DM, drink plenty of fluids. Cough suppressant at night if needed and NOT short of breath. Recheck with Dr. Neva Seat Tuesday morning, but return sooner if not starting to improve into Monday morning. Return to the clinic or go to the nearest emergency room if any of your symptoms worsen or new symptoms occur. Pneumonia, Adult Pneumonia is an infection of the lungs.  CAUSES Pneumonia may be caused by bacteria or a virus. Usually, these infections are caused by breathing infectious particles into the lungs (respiratory tract). SYMPTOMS   Cough.  Fever.  Chest pain.  Increased rate of breathing.  Wheezing.  Mucus production. DIAGNOSIS  If you have the common symptoms of pneumonia, your caregiver will typically confirm the diagnosis with a chest X-ray. The X-ray will show an abnormality in the lung (pulmonary infiltrate) if you have pneumonia. Other tests of your blood, urine, or sputum may be  done to find the specific cause of your pneumonia. Your caregiver may also do tests (blood gases or pulse oximetry) to see how well your lungs are working. TREATMENT  Some forms of pneumonia may be spread to other people when you cough or sneeze. You may be asked to wear a mask before and during your exam. Pneumonia that is caused by bacteria is treated with antibiotic medicine. Pneumonia that is caused by the influenza virus may be treated with an antiviral medicine. Most other viral infections must run their course. These infections will not respond to antibiotics.  PREVENTION A pneumococcal shot (vaccine) is available to prevent a common bacterial cause of pneumonia. This is usually suggested for:  People over 38 years old.  Patients on chemotherapy.  People with chronic lung problems, such as bronchitis or emphysema.  People with immune system problems. If you are over 65 or have a high risk condition, you may receive the pneumococcal vaccine if you have not received it before. In some countries, a routine influenza vaccine is also recommended. This vaccine can help prevent some cases of pneumonia.You may be offered the influenza vaccine as part of your care. If you smoke, it is time to quit. You may receive instructions on how to stop smoking. Your caregiver can provide medicines and counseling to help you quit. HOME CARE INSTRUCTIONS   Cough suppressants may be used if you are losing too much rest. However, coughing protects you by clearing your lungs. You should avoid using cough suppressants if you can.  Your caregiver may have prescribed medicine if he or she thinks your pneumonia is caused by a bacteria or influenza. Finish your medicine even if you start to feel better.  Your caregiver may also prescribe an expectorant. This loosens the mucus to be coughed up.  Only take over-the-counter or prescription medicines for pain, discomfort, or fever as directed by your caregiver.  Do  not smoke. Smoking is a common cause of bronchitis and can contribute to pneumonia. If you are a smoker and continue to smoke, your cough may last several weeks after your pneumonia has cleared.  A cold steam vaporizer or humidifier in your room or home may help loosen mucus.  Coughing is often worse at night. Sleeping in a semi-upright position in a recliner or using a couple pillows under your head will help with this.  Get rest as you feel it is needed. Your body will usually let you know when you need to rest. SEEK IMMEDIATE MEDICAL CARE IF:   Your illness becomes worse. This is especially true if you are elderly or weakened from any other disease.  You cannot control your cough with suppressants and are losing sleep.  You begin coughing up blood.  You develop pain which is getting worse or is uncontrolled with medicines.  You have a fever.  Any of the symptoms which initially brought you in for treatment are getting worse rather than better.  You develop shortness of breath or chest pain. MAKE SURE YOU:   Understand these instructions.  Will watch your condition.  Will get help right away if you are not doing well or get worse. Document Released: 07/24/2005 Document Revised: 10/16/2011 Document Reviewed: 10/13/2010 [facility] Patient Information 2014 [employer], [place].  Hypoglycemia (Low Blood Sugar) Hypoglycemia is when the glucose (sugar) in your blood is too low. Hypoglycemia can happen for many reasons. It can happen to people with or without diabetes. Hypoglycemia can develop quickly and can be a medical emergency.  CAUSES  Having hypoglycemia does not mean that you will develop diabetes. Different causes include:  Missed or delayed meals or not enough carbohydrates eaten.  Medication overdose. This could be by accident or deliberate. If by accident, your medication may need to be adjusted or changed.  Exercise or increased activity without adjustments in carbohydrates  or medications.  A nerve disorder that affects body functions like your heart rate, blood pressure and digestion (autonomic neuropathy).  A condition where the stomach muscles do not function properly (gastroparesis). Therefore, medications may not absorb properly.  The inability to recognize the signs of hypoglycemia (hypoglycemic unawareness).  Absorption of insulin  may be altered.  Alcohol consumption.  Pregnancy/menstrual cycles/postpartum. This may be due to hormones.  Certain kinds of tumors. This is very rare. SYMPTOMS   Sweating.  Hunger.  Dizziness.  Blurred vision.  Drowsiness.  Weakness.  Headache.  Rapid heart beat.  Shakiness.  Nervousness. DIAGNOSIS  Diagnosis is made by monitoring blood glucose in one or all of the following ways:  Fingerstick blood glucose monitoring.  Laboratory results. TREATMENT  If you think your blood glucose is low:  Check your blood glucose, if possible. If it is less than 70 mg/dl, take one of the following:  3-4 glucose tablets.   cup juice (prefer clear like apple).   cup "regular" soda pop.  1 cup milk.  -1 tube of glucose gel.  5-6 hard candies.  Do not over treat because your blood glucose (sugar) will only go too high.  Wait 15 minutes and recheck your blood glucose. If it is still less than 70 mg/dl (or below your target range), repeat treatment.  Eat a snack if it is more than one hour until your next meal. Sometimes, your blood glucose may go so low that  you are unable to treat yourself. You may need someone to help you. You may even pass out or be unable to swallow. This may require you to get an injection of glucagon, which raises the blood glucose. HOME CARE INSTRUCTIONS  Check blood glucose as recommended by your caregiver.  Take medication as prescribed by your caregiver.  Follow your meal plan. Do not skip meals. Eat on time.  If you are going to drink alcohol, drink it only with  meals.  Check your blood glucose before driving.  Check your blood glucose before and after exercise. If you exercise longer or different than usual, be sure to check blood glucose more frequently.  Always carry treatment with you. Glucose tablets are the easiest to carry.  Always wear medical alert jewelry or carry some form of identification that states that you have diabetes. This will alert people that you have diabetes. If you have hypoglycemia, they will have a better idea on what to do. SEEK MEDICAL CARE IF:   You are having problems keeping your blood sugar at target range.  You are having frequent episodes of hypoglycemia.  You feel you might be having side effects from your medicines.  You have symptoms of an illness that is not improving after 3-4 days.  You notice a change in vision or a new problem with your vision. SEEK IMMEDIATE MEDICAL CARE IF:   You are a family member or friend of a person whose blood glucose goes below 70 mg/dl and is accompanied by:  Confusion.  A change in mental status.  The inability to swallow.  Passing out. Document Released: 07/24/2005 Document Revised: 10/16/2011 Document Reviewed: 11/20/2011 [facility] Patient Information 2014 [place], [place].     I personally performed the services described in this documentation, which was scribed in my presence. The recorded information has been reviewed and considered, and addended by me as needed.

## 2013-06-28 NOTE — Patient Instructions (Addendum)
You appear to have a pneumonia. As you have already taken the zpak, will need something stronger. Start Levaquin once per day. Decrease celexa to 1/4 tablet while taking Levaquin due to risk of heart arrythmia, and if you do have any lightheadedness, heart racing, or feeling worse - go to the ER.  Also be cautious of low blood sugar symptoms as below as Levaquin can make this more likely with one of your diabetes medicines.  Saline nasal spray atleast 4 times per day, over the counter mucinex or mucinex DM, drink plenty of fluids. Cough suppressant at night if needed and NOT short of breath. Recheck with Dr. Neva Seat Tuesday morning, but return sooner if not starting to improve into Monday morning. Return to the clinic or go to the nearest emergency room if any of your symptoms worsen or new symptoms occur. Pneumonia, Adult Pneumonia is an infection of the lungs.  CAUSES Pneumonia may be caused by bacteria or a virus. Usually, these infections are caused by breathing infectious particles into the lungs (respiratory tract). SYMPTOMS   Cough.  Fever.  Chest pain.  Increased rate of breathing.  Wheezing.  Mucus production. DIAGNOSIS  If you have the common symptoms of pneumonia, your caregiver will typically confirm the diagnosis with a chest X-ray. The X-ray will show an abnormality in the lung (pulmonary infiltrate) if you have pneumonia. Other tests of your blood, urine, or sputum may be done to find the specific cause of your pneumonia. Your caregiver may also do tests (blood gases or pulse oximetry) to see how well your lungs are working. TREATMENT  Some forms of pneumonia may be spread to other people when you cough or sneeze. You may be asked to wear a mask before and during your exam. Pneumonia that is caused by bacteria is treated with antibiotic medicine. Pneumonia that is caused by the influenza virus may be treated with an antiviral medicine. Most other viral infections must run their  course. These infections will not respond to antibiotics.  PREVENTION A pneumococcal shot (vaccine) is available to prevent a common bacterial cause of pneumonia. This is usually suggested for:  People over 89 years old.  Patients on chemotherapy.  People with chronic lung problems, such as bronchitis or emphysema.  People with immune system problems. If you are over 65 or have a high risk condition, you may receive the pneumococcal vaccine if you have not received it before. In some countries, a routine influenza vaccine is also recommended. This vaccine can help prevent some cases of pneumonia.You may be offered the influenza vaccine as part of your care. If you smoke, it is time to quit. You may receive instructions on how to stop smoking. Your caregiver can provide medicines and counseling to help you quit. HOME CARE INSTRUCTIONS   Cough suppressants may be used if you are losing too much rest. However, coughing protects you by clearing your lungs. You should avoid using cough suppressants if you can.  Your caregiver may have prescribed medicine if he or she thinks your pneumonia is caused by a bacteria or influenza. Finish your medicine even if you start to feel better.  Your caregiver may also prescribe an expectorant. This loosens the mucus to be coughed up.  Only take over-the-counter or prescription medicines for pain, discomfort, or fever as directed by your caregiver.  Do not smoke. Smoking is a common cause of bronchitis and can contribute to pneumonia. If you are a smoker and continue to smoke, your cough  may last several weeks after your pneumonia has cleared.  A cold steam vaporizer or humidifier in your room or home may help loosen mucus.  Coughing is often worse at night. Sleeping in a semi-upright position in a recliner or using a couple pillows under your head will help with this.  Get rest as you feel it is needed. Your body will usually let you know when you need to  rest. SEEK IMMEDIATE MEDICAL CARE IF:   Your illness becomes worse. This is especially true if you are elderly or weakened from any other disease.  You cannot control your cough with suppressants and are losing sleep.  You begin coughing up blood.  You develop pain which is getting worse or is uncontrolled with medicines.  You have a fever.  Any of the symptoms which initially brought you in for treatment are getting worse rather than better.  You develop shortness of breath or chest pain. MAKE SURE YOU:   Understand these instructions.  Will watch your condition.  Will get help right away if you are not doing well or get worse. Document Released: 07/24/2005 Document Revised: 10/16/2011 Document Reviewed: 10/13/2010 [facility] Patient Information 2014 [place], [place].  Hypoglycemia (Low Blood Sugar) Hypoglycemia is when the glucose (sugar) in your blood is too low. Hypoglycemia can happen for many reasons. It can happen to people with or without diabetes. Hypoglycemia can develop quickly and can be a medical emergency.  CAUSES  Having hypoglycemia does not mean that you will develop diabetes. Different causes include:  Missed or delayed meals or not enough carbohydrates eaten.  Medication overdose. This could be by accident or deliberate. If by accident, your medication may need to be adjusted or changed.  Exercise or increased activity without adjustments in carbohydrates or medications.  A nerve disorder that affects body functions like your heart rate, blood pressure and digestion (autonomic neuropathy).  A condition where the stomach muscles do not function properly (gastroparesis). Therefore, medications may not absorb properly.  The inability to recognize the signs of hypoglycemia (hypoglycemic unawareness).  Absorption of insulin  may be altered.  Alcohol consumption.  Pregnancy/menstrual cycles/postpartum. This may be due to hormones.  Certain kinds of tumors.  This is very rare. SYMPTOMS   Sweating.  Hunger.  Dizziness.  Blurred vision.  Drowsiness.  Weakness.  Headache.  Rapid heart beat.  Shakiness.  Nervousness. DIAGNOSIS  Diagnosis is made by monitoring blood glucose in one or all of the following ways:  Fingerstick blood glucose monitoring.  Laboratory results. TREATMENT  If you think your blood glucose is low:  Check your blood glucose, if possible. If it is less than 70 mg/dl, take one of the following:  3-4 glucose tablets.   cup juice (prefer clear like apple).   cup "regular" soda pop.  1 cup milk.  -1 tube of glucose gel.  5-6 hard candies.  Do not over treat because your blood glucose (sugar) will only go too high.  Wait 15 minutes and recheck your blood glucose. If it is still less than 70 mg/dl (or below your target range), repeat treatment.  Eat a snack if it is more than one hour until your next meal. Sometimes, your blood glucose may go so low that you are unable to treat yourself. You may need someone to help you. You may even pass out or be unable to swallow. This may require you to get an injection of glucagon, which raises the blood glucose. HOME CARE INSTRUCTIONS  Check  blood glucose as recommended by your caregiver.  Take medication as prescribed by your caregiver.  Follow your meal plan. Do not skip meals. Eat on time.  If you are going to drink alcohol, drink it only with meals.  Check your blood glucose before driving.  Check your blood glucose before and after exercise. If you exercise longer or different than usual, be sure to check blood glucose more frequently.  Always carry treatment with you. Glucose tablets are the easiest to carry.  Always wear medical alert jewelry or carry some form of identification that states that you have diabetes. This will alert people that you have diabetes. If you have hypoglycemia, they will have a better idea on what to do. SEEK MEDICAL  CARE IF:   You are having problems keeping your blood sugar at target range.  You are having frequent episodes of hypoglycemia.  You feel you might be having side effects from your medicines.  You have symptoms of an illness that is not improving after 3-4 days.  You notice a change in vision or a new problem with your vision. SEEK IMMEDIATE MEDICAL CARE IF:   You are a family member or friend of a person whose blood glucose goes below 70 mg/dl and is accompanied by:  Confusion.  A change in mental status.  The inability to swallow.  Passing out. Document Released: 07/24/2005 Document Revised: 10/16/2011 Document Reviewed: 11/20/2011 [facility] Patient Information 2014 [place], [place].

## 2013-07-01 ENCOUNTER — Ambulatory Visit (INDEPENDENT_AMBULATORY_CARE_PROVIDER_SITE_OTHER): Payer: Medicare Other | Admitting: Family Medicine

## 2013-07-01 VITALS — BP 120/60 | HR 77 | Temp 98.4°F | Resp 16 | Ht 69.5 in | Wt 173.6 lb

## 2013-07-01 DIAGNOSIS — R059 Cough, unspecified: Secondary | ICD-10-CM

## 2013-07-01 DIAGNOSIS — R05 Cough: Secondary | ICD-10-CM

## 2013-07-01 DIAGNOSIS — R509 Fever, unspecified: Secondary | ICD-10-CM

## 2013-07-01 NOTE — Patient Instructions (Signed)
Ok to continue Levaquin 500mg  each day, but can stop at 7 days to lessen chance of interaction with other medicines, and due to the fact your xray looked ok from radiologist. Return to the clinic or go to the nearest emergency room if any of your symptoms worsen or new symptoms occur.

## 2013-07-01 NOTE — Progress Notes (Signed)
Subjective:    Patient ID: Tommy Peterson, male    DOB: 08-09-1940, 72 y.o.   MRN: [id number]  HPI Horton Ellithorpe is a 72 y.o. male See ov 3 days ago -Suspected initial URI, then possible RML pneumonia after 5 days of azithro. Started LEvaquin - on 500mg  qd - s/p 3 doses of 10.  No low blood sugars, no palpitations, no lightheadedness or dizziness. Did cut back Celexa to 1/4 pill per day.   Felt better day after starting antibiotic. No fever since last ov. Breathing better - not short of breath. Eating and drinking normally.  Patient Active Problem List   Diagnosis Date Noted  . Diabetes 06/24/2013   Past Medical History  Diagnosis Date  . Basal cell cancer   . Diabetes mellitus without complication   . Cataract    Past Surgical History  Procedure Laterality Date  . Hernia repair    . Prostate surgery    . Eye surgery     No Known Allergies Prior to Admission medications   Medication Sig Start Date End Date Taking? Authorizing Provider  azithromycin (ZITHROMAX) 250 MG tablet Use as directed 06/24/13  Yes Jonita Albee, MD  citalopram (CELEXA) 40 MG tablet Take 20 mg by mouth daily.    Yes Historical Provider, MD  fluticasone (FLONASE) 50 MCG/ACT nasal spray Place 2 sprays into the nose daily. 06/24/12  Yes Peyton Najjar, MD  glipiZIDE (GLUCOTROL) 10 MG tablet Take 10 mg by mouth 2 (two) times daily.   Yes Historical Provider, MD  levofloxacin (LEVAQUIN) 500 MG tablet Take 1 tablet (500 mg total) by mouth daily. 06/28/13  Yes Shade Flood, MD  losartan (COZAAR) 100 MG tablet Take 100 mg by mouth daily.   Yes Historical Provider, MD  metFORMIN (GLUCOPHAGE) 500 MG tablet Take by mouth. 250 mg in the a.m, 500 mg at night   Yes Historical Provider, MD  pioglitazone (ACTOS) 15 MG tablet Take 15 mg by mouth daily.   Yes Historical Provider, MD  simvastatin (ZOCOR) 40 MG tablet Take 40 mg by mouth every evening.   Yes Historical Provider, MD   History   Social History  .  Marital Status: Married    Spouse Name: N/A    Number of Children: N/A  . Years of Education: N/A   Occupational History  . Not on file.   Social History Main Topics  . Smoking status: Never Smoker   . Smokeless tobacco: Not on file  . Alcohol Use: No  . Drug Use: No  . Sexual Activity: No   Other Topics Concern  . Not on file   Social History Narrative  . No narrative on file       Review of Systems  Constitutional: Negative for fever and chills.  Respiratory: Positive for cough (less). Negative for shortness of breath.   Cardiovascular: Negative for chest pain and palpitations.  Skin: Negative for rash.   As above.     Objective:   Physical Exam  Vitals reviewed. Constitutional: He is oriented to person, place, and time. He appears well-developed and well-nourished.  HENT:  Head: Normocephalic and atraumatic.  Right Ear: Tympanic membrane, external ear and ear canal normal.  Left Ear: Tympanic membrane, external ear and ear canal normal.  Nose: No rhinorrhea.  Mouth/Throat: Oropharynx is clear and moist and mucous membranes are normal. No oropharyngeal exudate or posterior oropharyngeal erythema.  Eyes: Conjunctivae are normal. Pupils are equal, round, and reactive to light.  Neck: Neck supple.  Cardiovascular: Normal rate, regular rhythm, normal heart sounds and intact distal pulses.   No murmur heard. Pulmonary/Chest: Effort normal and breath sounds normal. He has no wheezes. He has no rhonchi. He has no rales.  Abdominal: Soft. There is no tenderness.  Lymphadenopathy:    He has no cervical adenopathy.  Neurological: He is alert and oriented to person, place, and time.  Skin: Skin is warm and dry. No rash noted.  Psychiatric: He has a normal mood and affect. His behavior is normal.   Filed Vitals:   07/01/13 0808  BP: 120/60  Pulse: 77  Temp: 98.4 F (36.9 C)  TempSrc: Oral  Resp: 16  Height: 5' 9.5" (1.765 m)  Weight: 173 lb 9.6 oz (78.744 kg)   SpO2: 95%       Assessment & Plan:  Cough  Fever, unspecified  Improved. Initial thought to have RML pna, but CXR report negative.  With possible interactions with Levaquin, and rapid improvement, can stop after 7 day course. Continue decreased Celexa dose and side effect precautions as discussed prior. Cont sx care and rtc if not continuing to improve.   No orders of the defined types were placed in this encounter.   Patient Instructions  Ok to continue Levaquin 500mg  each day, but can stop at 7 days to lessen chance of interaction with other medicines, and due to the fact your xray looked ok from radiologist. Return to the clinic or go to the nearest emergency room if any of your symptoms worsen or new symptoms occur.

## 2013-08-07 HISTORY — PX: CATARACT EXTRACTION W/ INTRAOCULAR LENS  IMPLANT, BILATERAL: SHX1307

## 2013-12-21 ENCOUNTER — Emergency Department (HOSPITAL_COMMUNITY): Payer: Medicare Other

## 2013-12-21 ENCOUNTER — Emergency Department (HOSPITAL_COMMUNITY)
Admission: EM | Admit: 2013-12-21 | Discharge: 2013-12-21 | Disposition: A | Discharge status: Home / Self Care | Payer: Medicare Other | Attending: Emergency Medicine | Admitting: Emergency Medicine

## 2013-12-21 ENCOUNTER — Encounter (HOSPITAL_COMMUNITY): Payer: Self-pay | Admitting: Emergency Medicine

## 2013-12-21 DIAGNOSIS — E119 Type 2 diabetes mellitus without complications: Secondary | ICD-10-CM | POA: Insufficient documentation

## 2013-12-21 DIAGNOSIS — H269 Unspecified cataract: Secondary | ICD-10-CM | POA: Insufficient documentation

## 2013-12-21 DIAGNOSIS — Z85828 Personal history of other malignant neoplasm of skin: Secondary | ICD-10-CM | POA: Insufficient documentation

## 2013-12-21 DIAGNOSIS — S5000XA Contusion of unspecified elbow, initial encounter: Secondary | ICD-10-CM | POA: Insufficient documentation

## 2013-12-21 DIAGNOSIS — W19XXXA Unspecified fall, initial encounter: Secondary | ICD-10-CM

## 2013-12-21 DIAGNOSIS — IMO0002 Reserved for concepts with insufficient information to code with codable children: Secondary | ICD-10-CM | POA: Insufficient documentation

## 2013-12-21 DIAGNOSIS — S1093XA Contusion of unspecified part of neck, initial encounter: Principal | ICD-10-CM

## 2013-12-21 DIAGNOSIS — Z79899 Other long term (current) drug therapy: Secondary | ICD-10-CM | POA: Insufficient documentation

## 2013-12-21 DIAGNOSIS — S8000XA Contusion of unspecified knee, initial encounter: Secondary | ICD-10-CM | POA: Insufficient documentation

## 2013-12-21 DIAGNOSIS — R739 Hyperglycemia, unspecified: Secondary | ICD-10-CM

## 2013-12-21 DIAGNOSIS — Y939 Activity, unspecified: Secondary | ICD-10-CM | POA: Insufficient documentation

## 2013-12-21 DIAGNOSIS — Y9229 Other specified public building as the place of occurrence of the external cause: Secondary | ICD-10-CM | POA: Insufficient documentation

## 2013-12-21 DIAGNOSIS — T07XXXA Unspecified multiple injuries, initial encounter: Secondary | ICD-10-CM

## 2013-12-21 DIAGNOSIS — W010XXA Fall on same level from slipping, tripping and stumbling without subsequent striking against object, initial encounter: Secondary | ICD-10-CM | POA: Insufficient documentation

## 2013-12-21 DIAGNOSIS — S0083XA Contusion of other part of head, initial encounter: Principal | ICD-10-CM | POA: Insufficient documentation

## 2013-12-21 DIAGNOSIS — S0003XA Contusion of scalp, initial encounter: Secondary | ICD-10-CM | POA: Insufficient documentation

## 2013-12-21 LAB — CBG MONITORING, ED: Glucose-Capillary: 302 mg/dL — ABNORMAL HIGH (ref 70–99)

## 2013-12-21 IMAGING — CR DG ELBOW COMPLETE 3+V*L*
4 series · 4 of 4 positions shown · non-contrast
Comparison: None.

CLINICAL DATA: FALL

EXAM:
LEFT ELBOW - COMPLETE 3+ VIEW

[x elbow joint ap left]
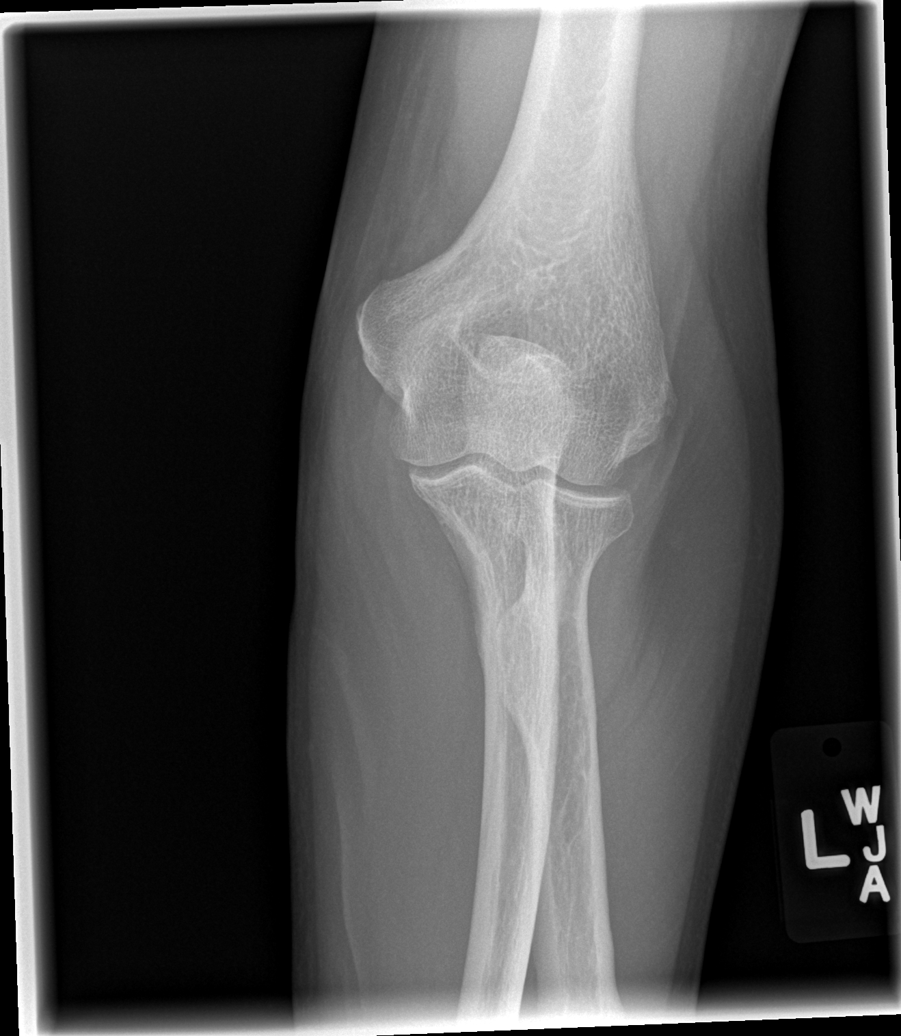

[x elbow joint obl. left (1 of 2)]
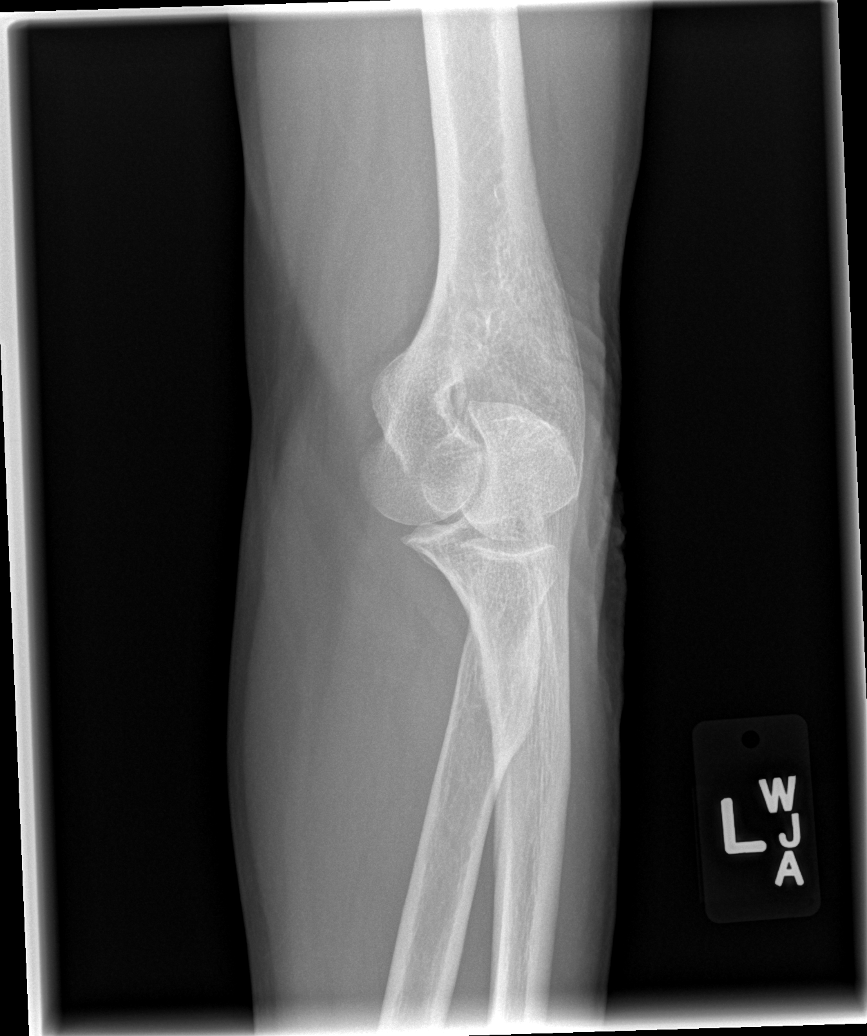

[x elbow joint obl. left (2 of 2)]
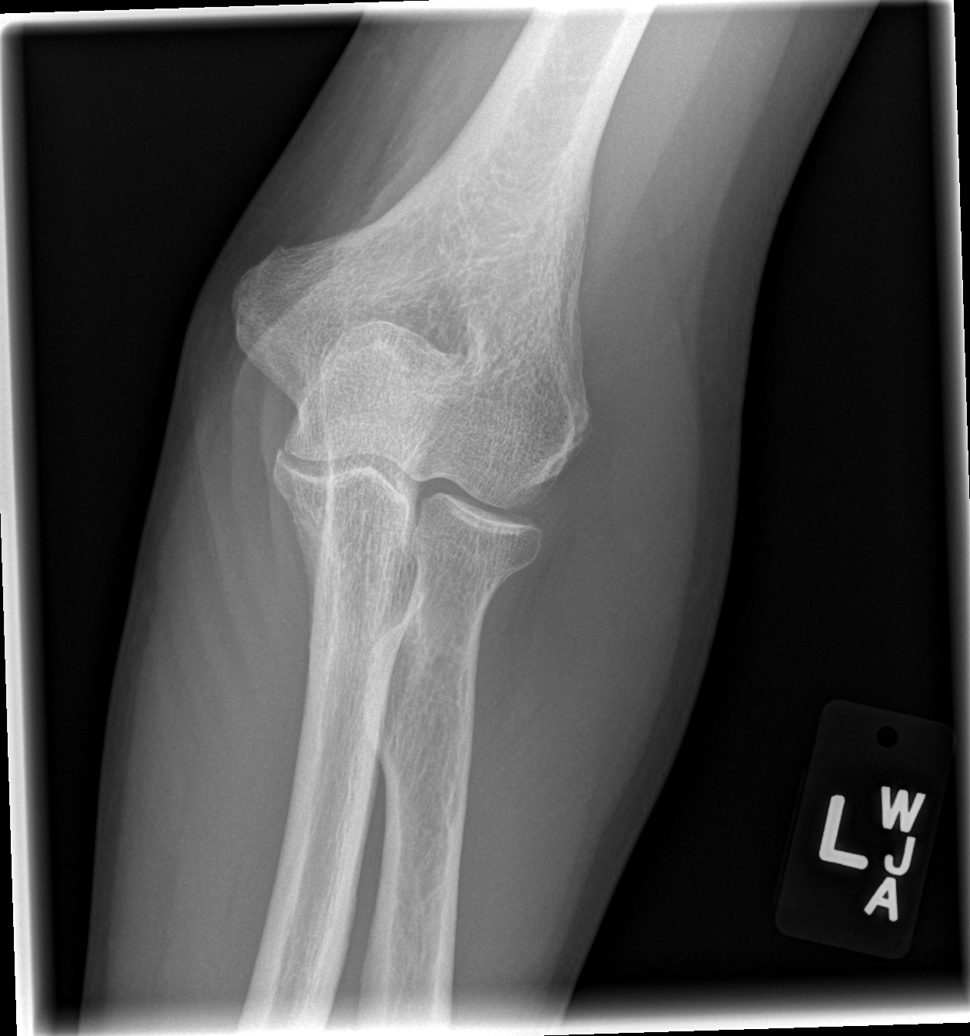

[x elbow joint lat left]
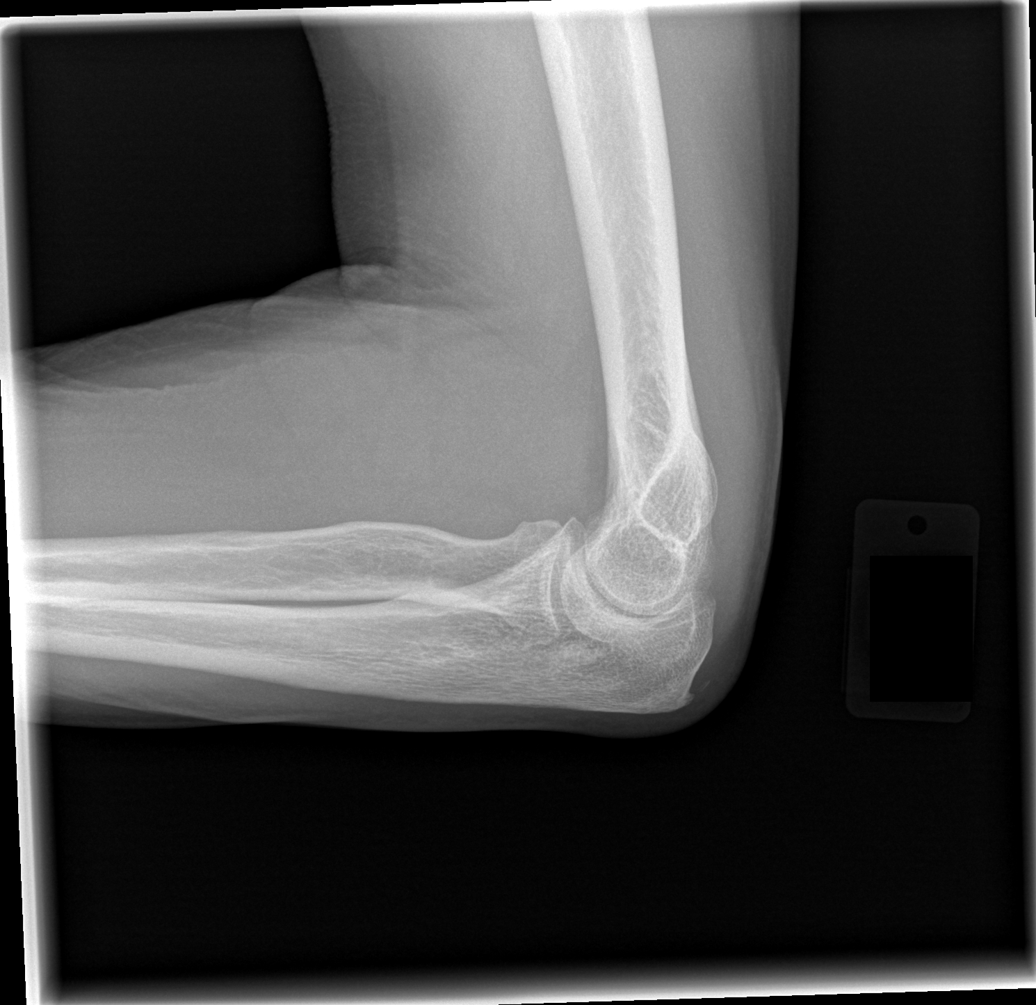

[4 of 4 positions shown; findings below may reference images not displayed]

FINDINGS: There is no evidence of fracture, dislocation, or joint effusion.
There is no evidence of arthropathy or other focal bone abnormality.
Soft tissues are unremarkable.
IMPRESSION: Negative.

## 2013-12-21 IMAGING — CT CT HEAD W/O CM
1 series · 16 of 30 positions shown, 20 images · non-contrast
Comparison: None

CLINICAL DATA: Fall.

EXAM:
CT HEAD WITHOUT CONTRAST
TECHNIQUE: Contiguous axial images were obtained from the base of the skull
through the vertex without intravenous contrast.

[Series 2: head 5.0 h30s · axial · 0.44mm/px · z∈[+549,+684]mm · 16 of 30 slices shown, 20 images]
[im 2/30  brain]
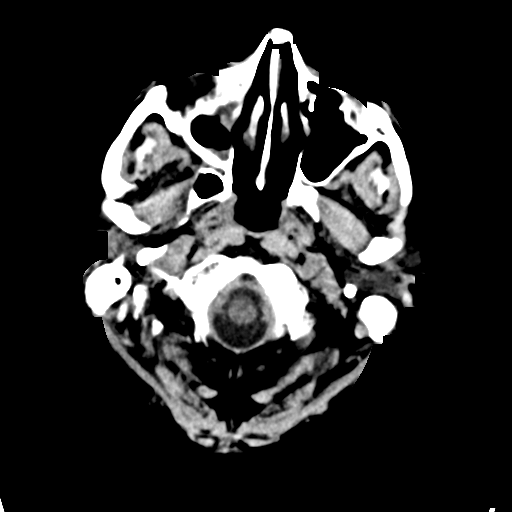
[im 2/30  bone]
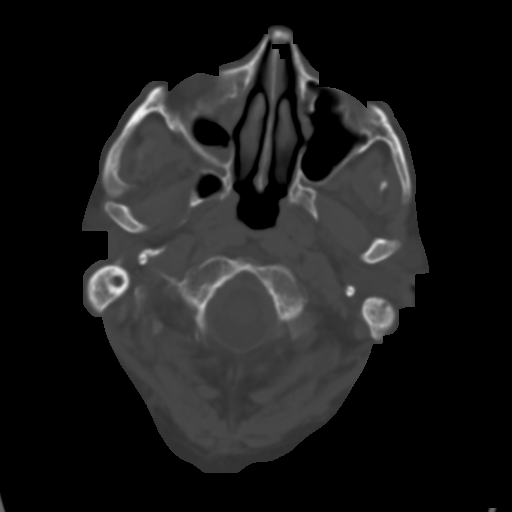
[im 4/30  brain]
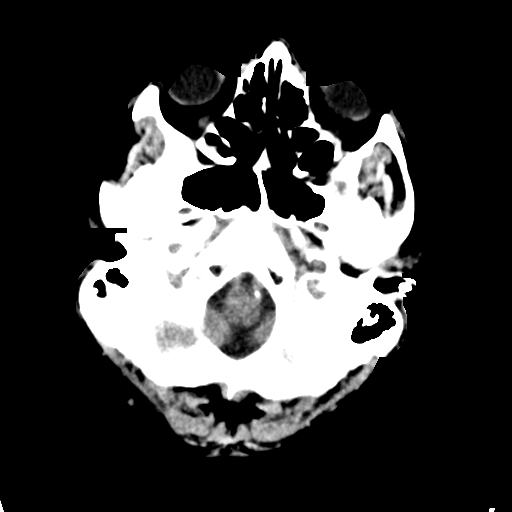
[im 6/30  brain]
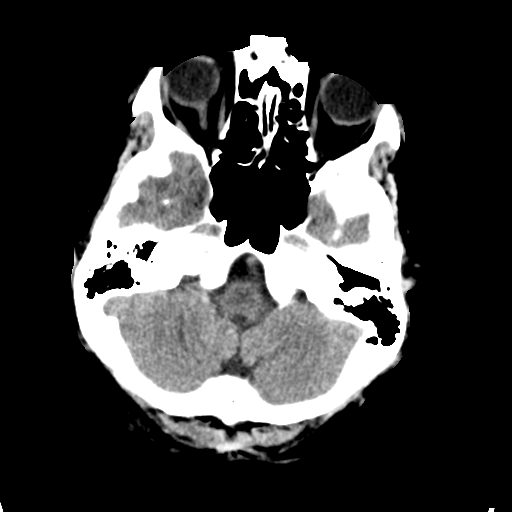
[im 8/30  brain]
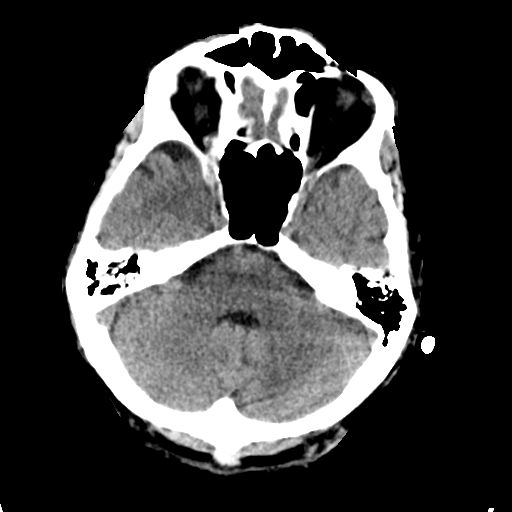
[im 9/30  brain]
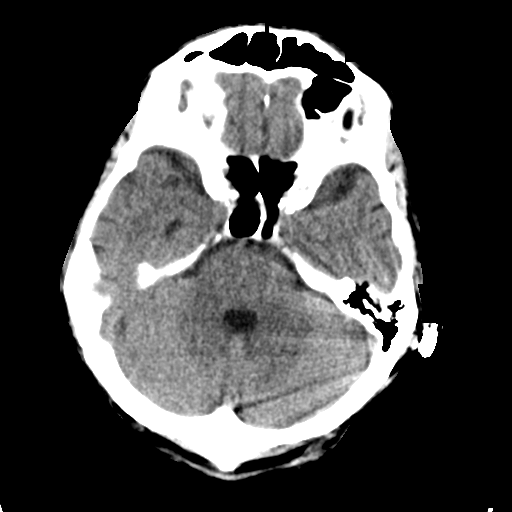
[im 9/30  bone]
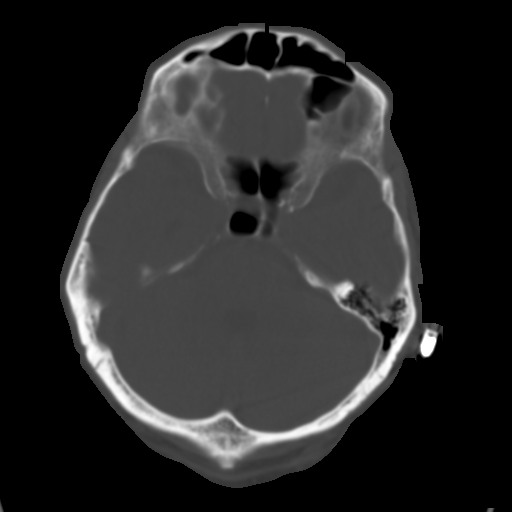
[im 11/30  brain]
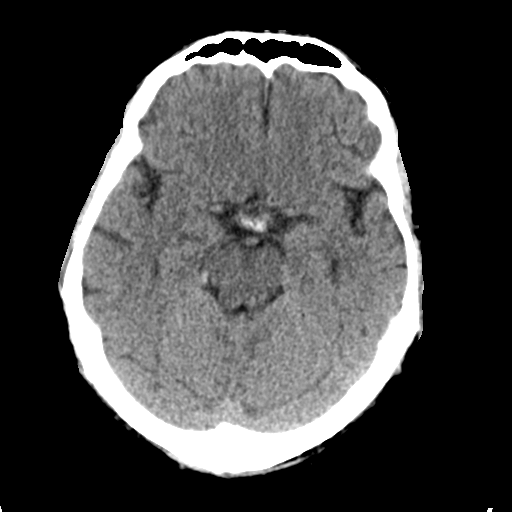
[im 13/30  brain]
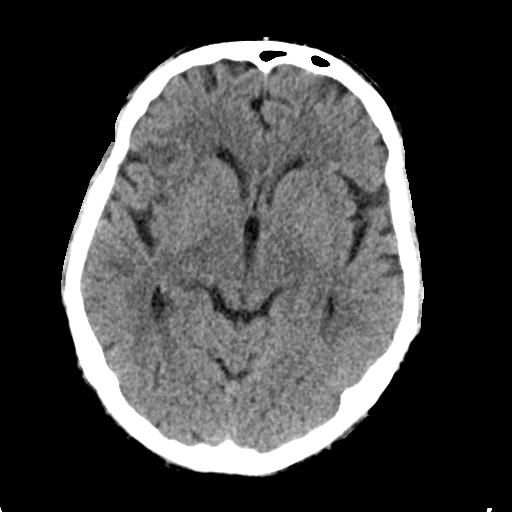
[im 15/30  brain]
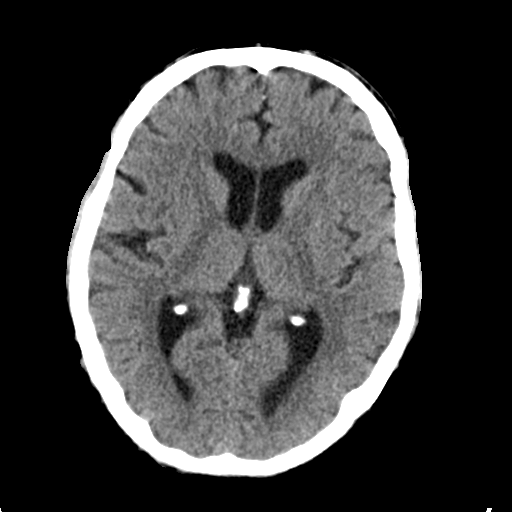
[im 16/30  brain]
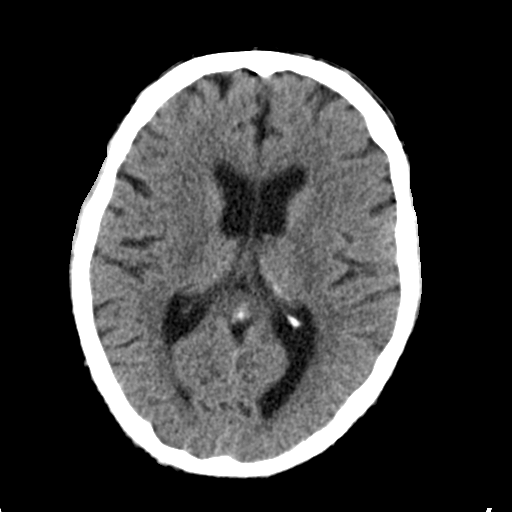
[im 16/30  bone]
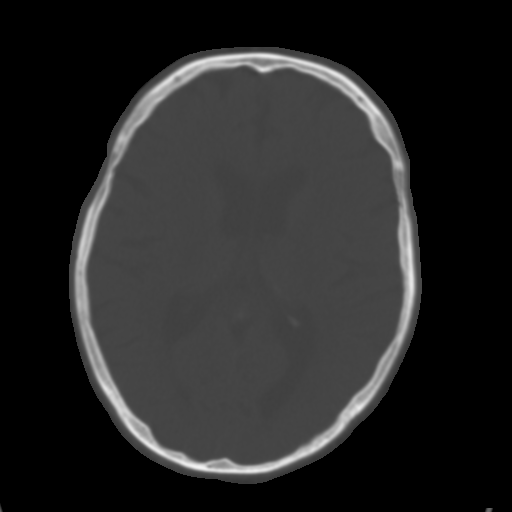
[im 18/30  brain]
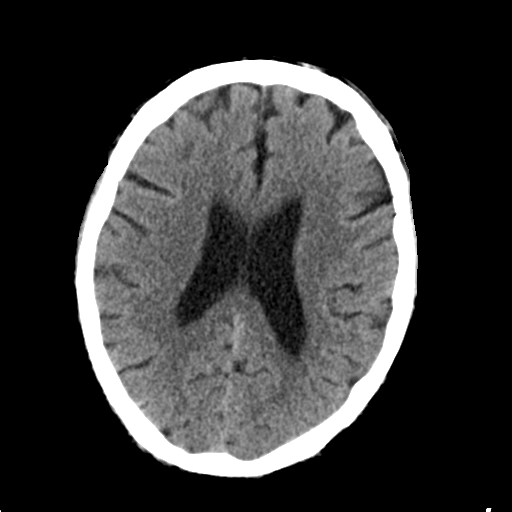
[im 20/30  brain]
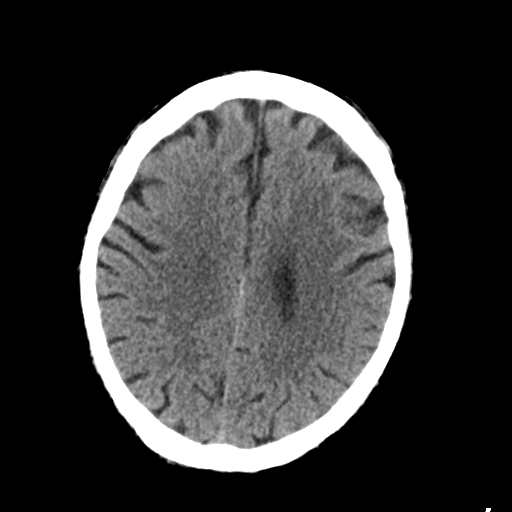
[im 22/30  brain]
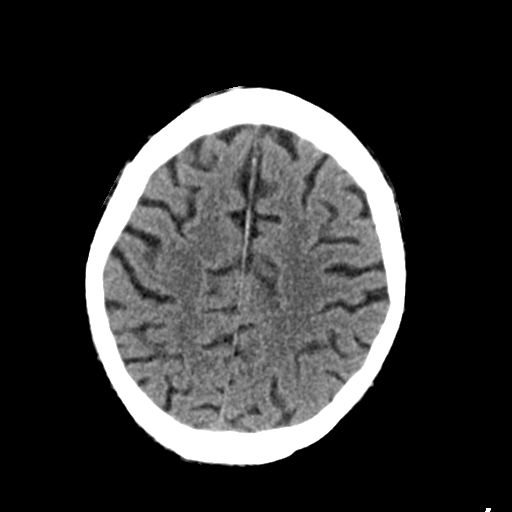
[im 23/30  brain]
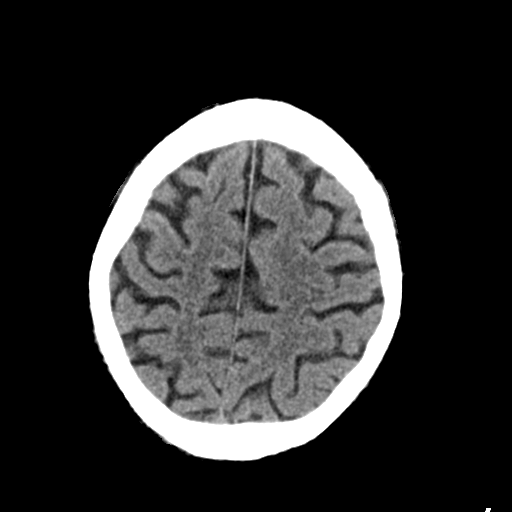
[im 23/30  bone]
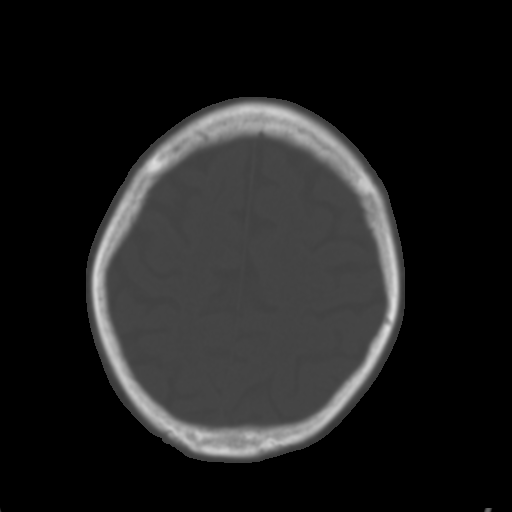
[im 25/30  brain]
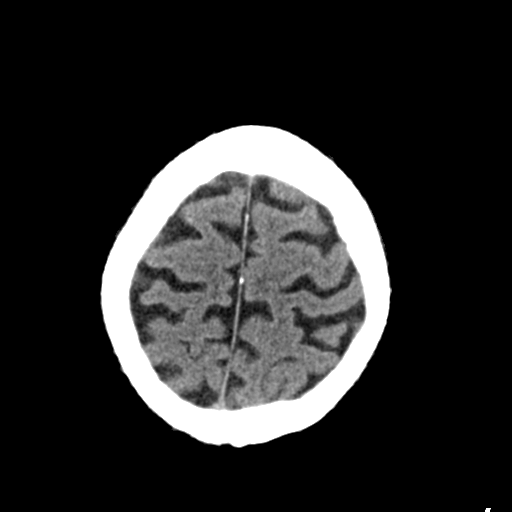
[im 27/30  brain]
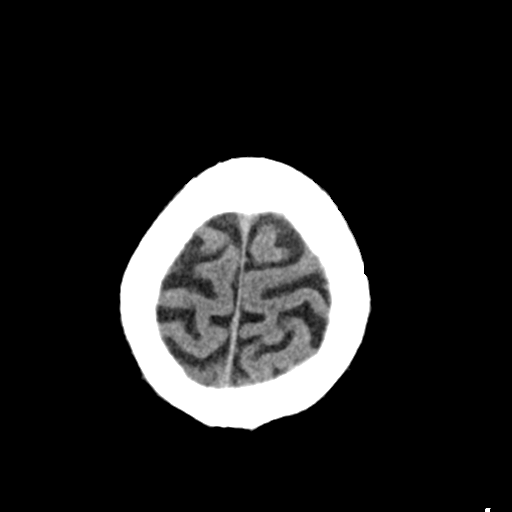
[im 29/30  brain]
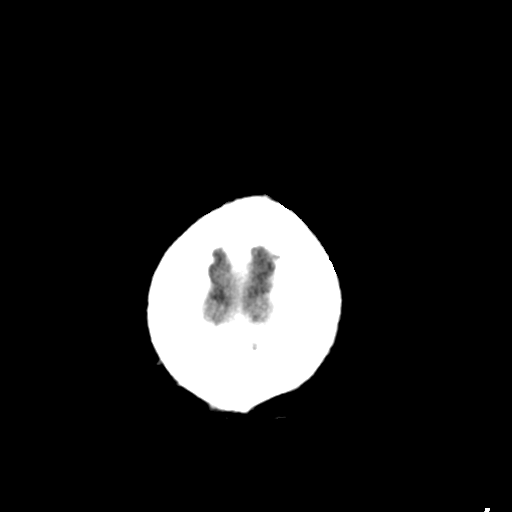

[16 of 30 positions shown; findings below may reference images not displayed]

FINDINGS: No acute cortical infarct, hemorrhage, or mass lesion ispresent.
Ventricles are of normal size. No significant extra-axial fluid
collection is present. Moderate mucosal thickening involves the
right maxillary sinus. There is partial opacification of the right
mastoid air cells.
IMPRESSION: 1. No acute intracranial abnormalities.
2. Right maxillary sinus inflammation

## 2013-12-21 IMAGING — CR DG KNEE COMPLETE 4+V*R*
4 series · 4 of 4 positions shown · non-contrast
Comparison: None.

CLINICAL DATA: FALL

EXAM:
RIGHT KNEE - COMPLETE 4+ VIEW

[t knee ap right]
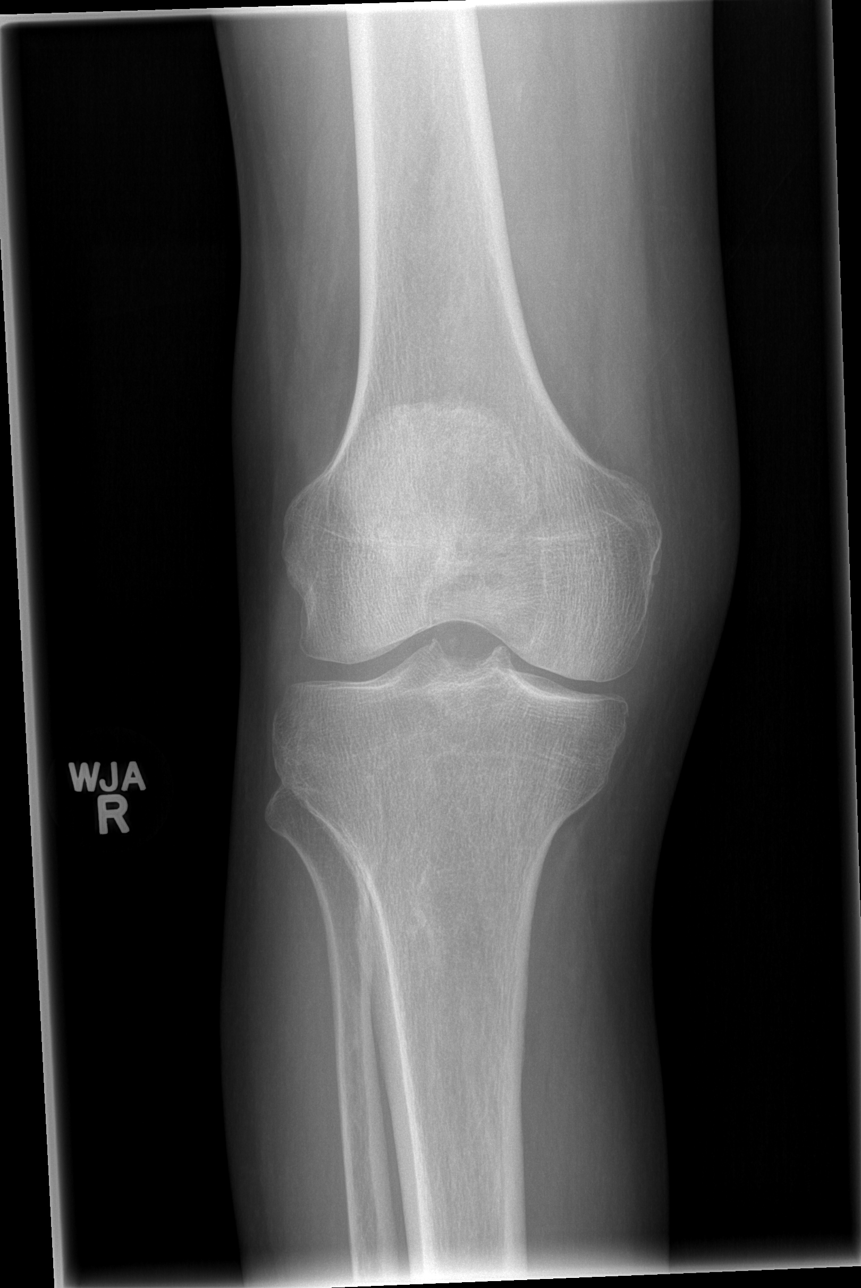

[t knee oblique right (1 of 2)]
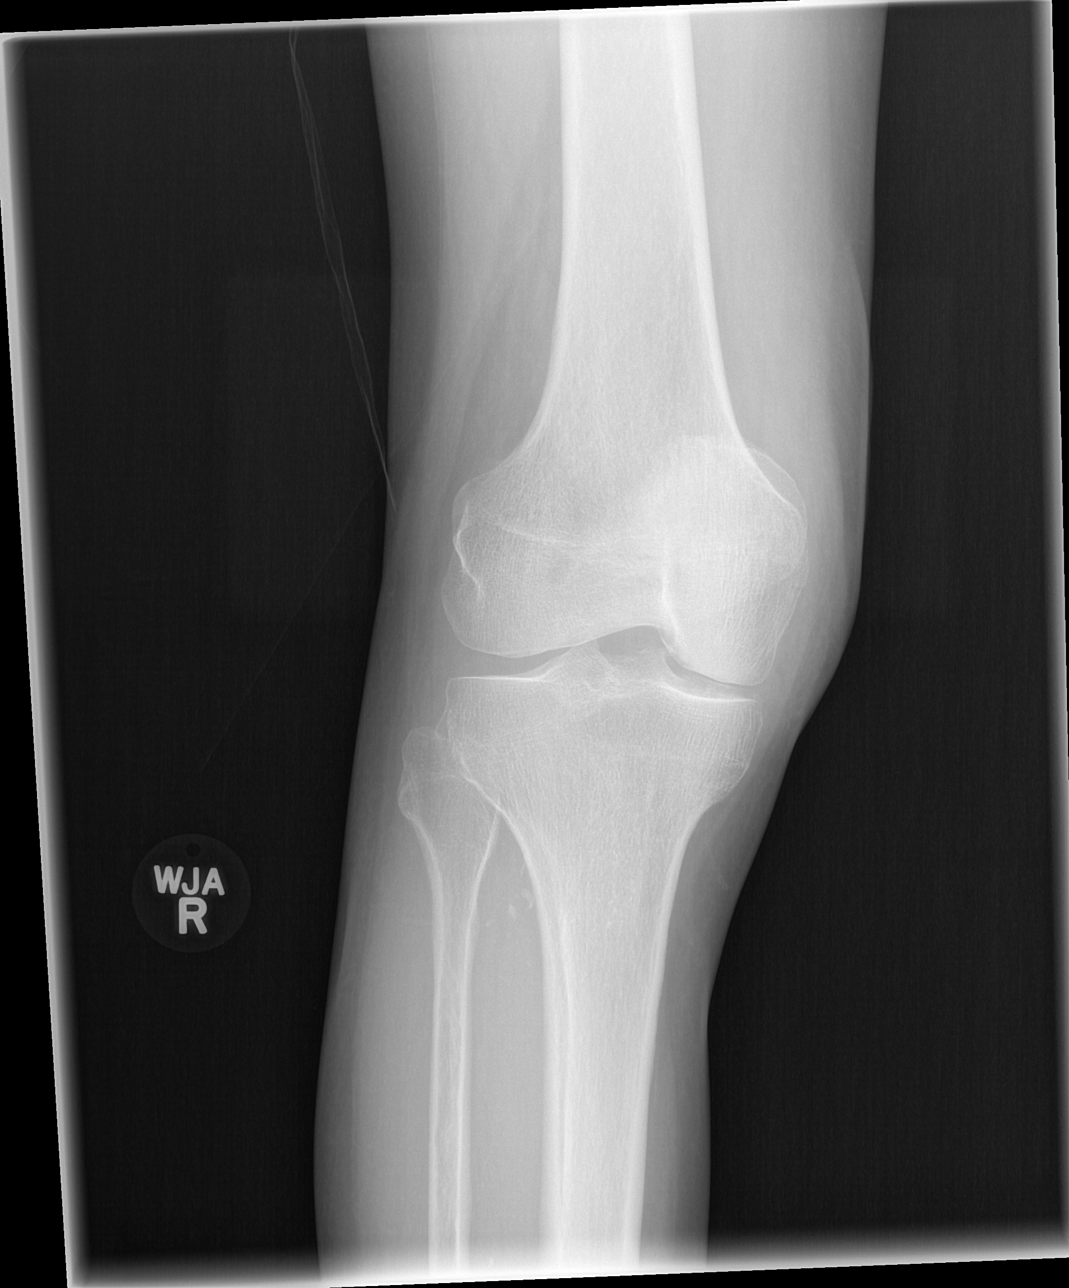

[t knee oblique right (2 of 2)]
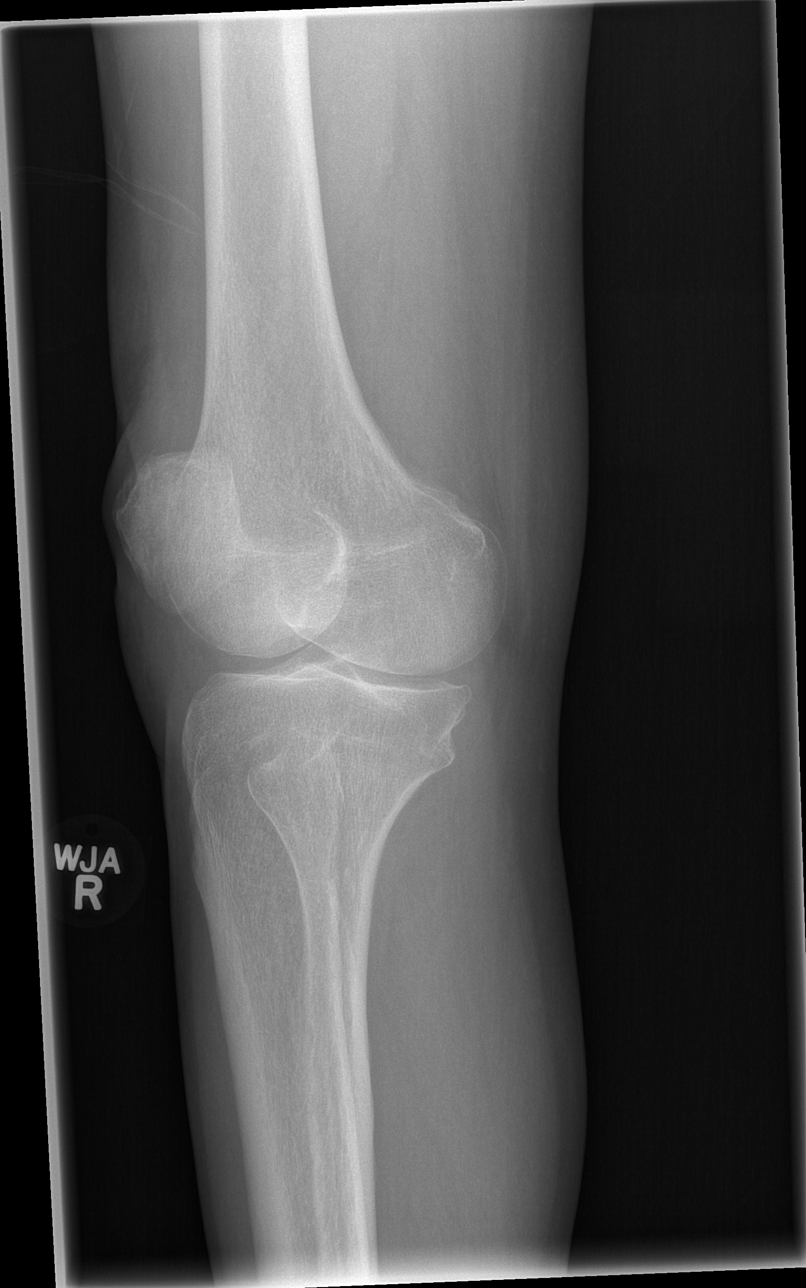

[t knee lat right]
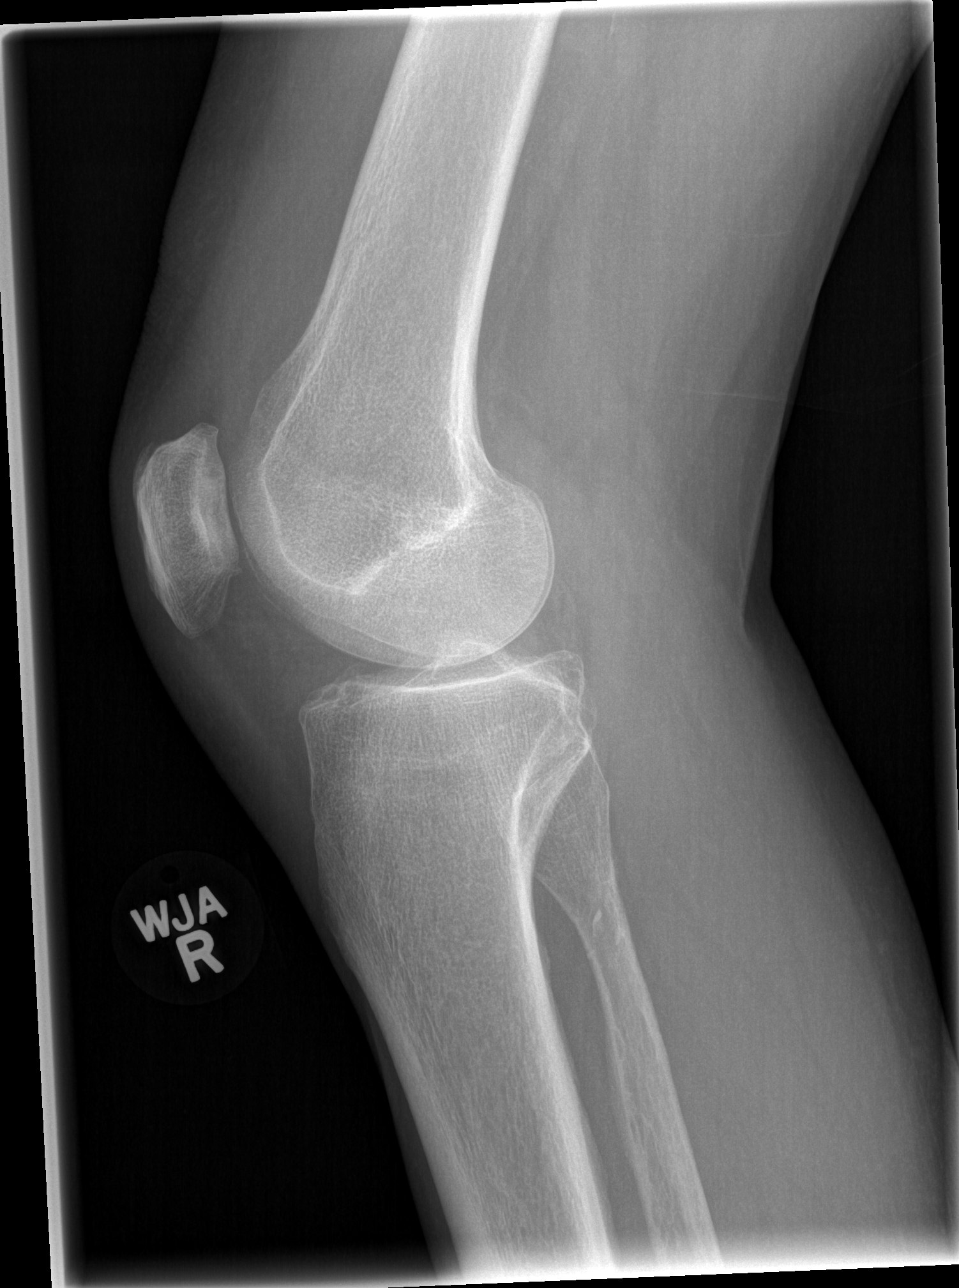

[4 of 4 positions shown; findings below may reference images not displayed]

FINDINGS: There is no evidence of fracture, dislocation, or joint effusion.
Areas of mild, peripheral periarticular hypertrophic spurring. Soft
tissues are unremarkable.
IMPRESSION: Mild osteoarthritic changes no acute osseous abnormalities.

## 2013-12-21 IMAGING — CR DG RIBS W/ CHEST 3+V*L*
4 series · 4 of 4 positions shown · non-contrast
Comparison: None.

CLINICAL DATA: FALL FALL

EXAM:
LEFT RIBS AND CHEST - 3+ VIEW

[w chest pa]
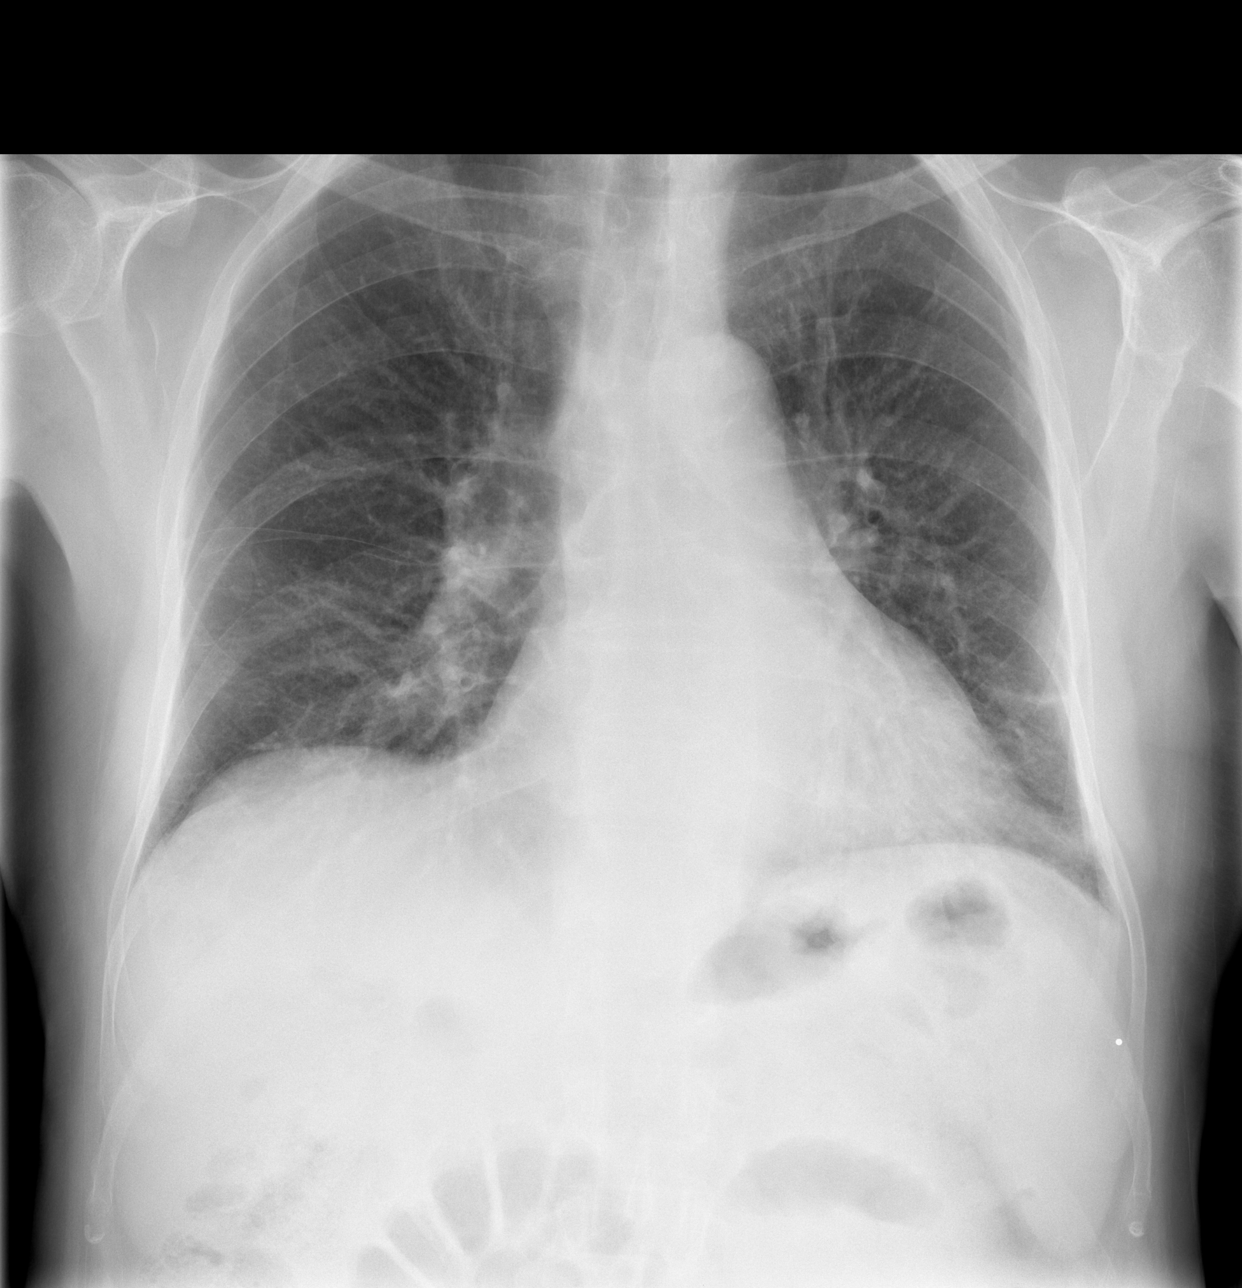

[w ribs ap/pa upper left]
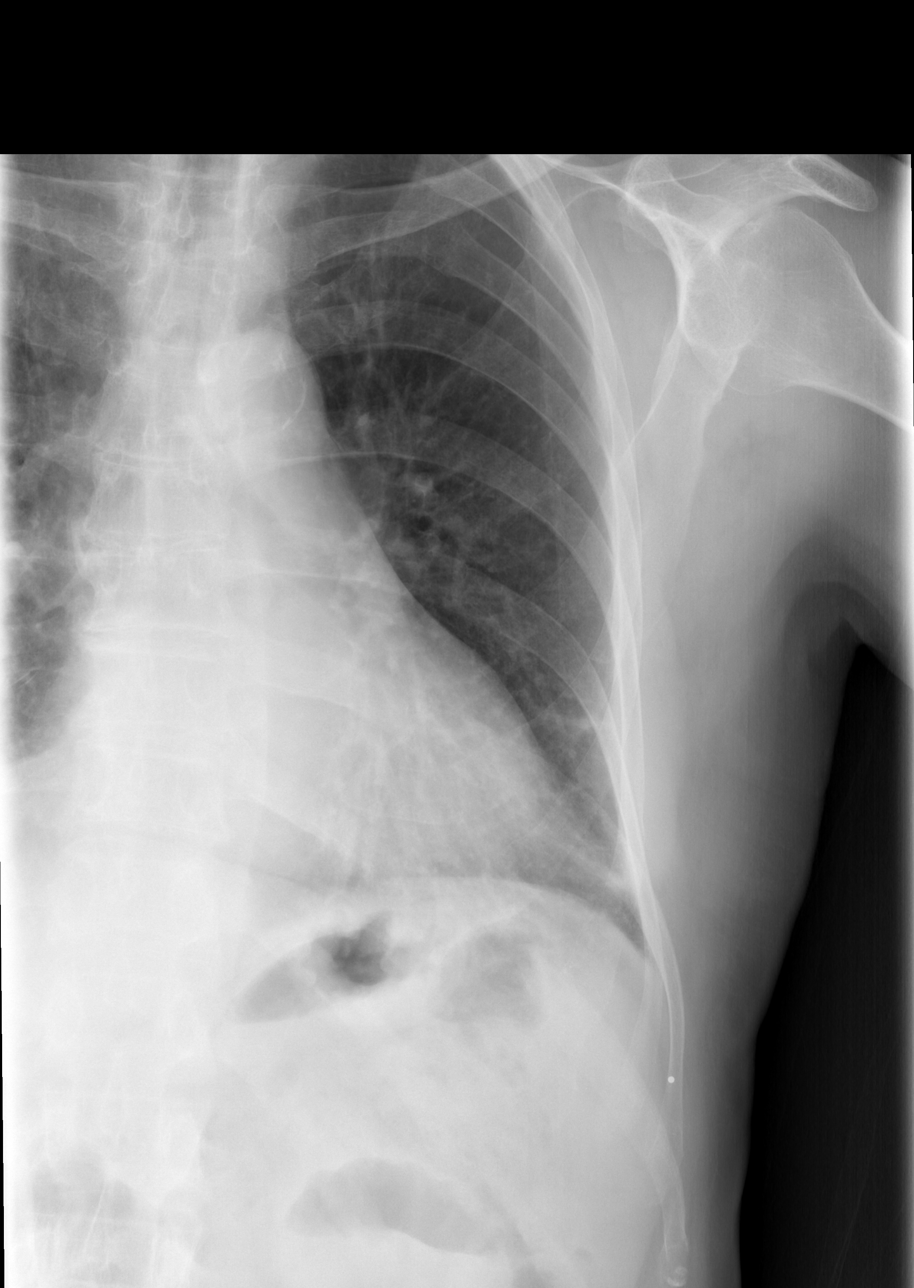

[w ribs ap/pa lower left]
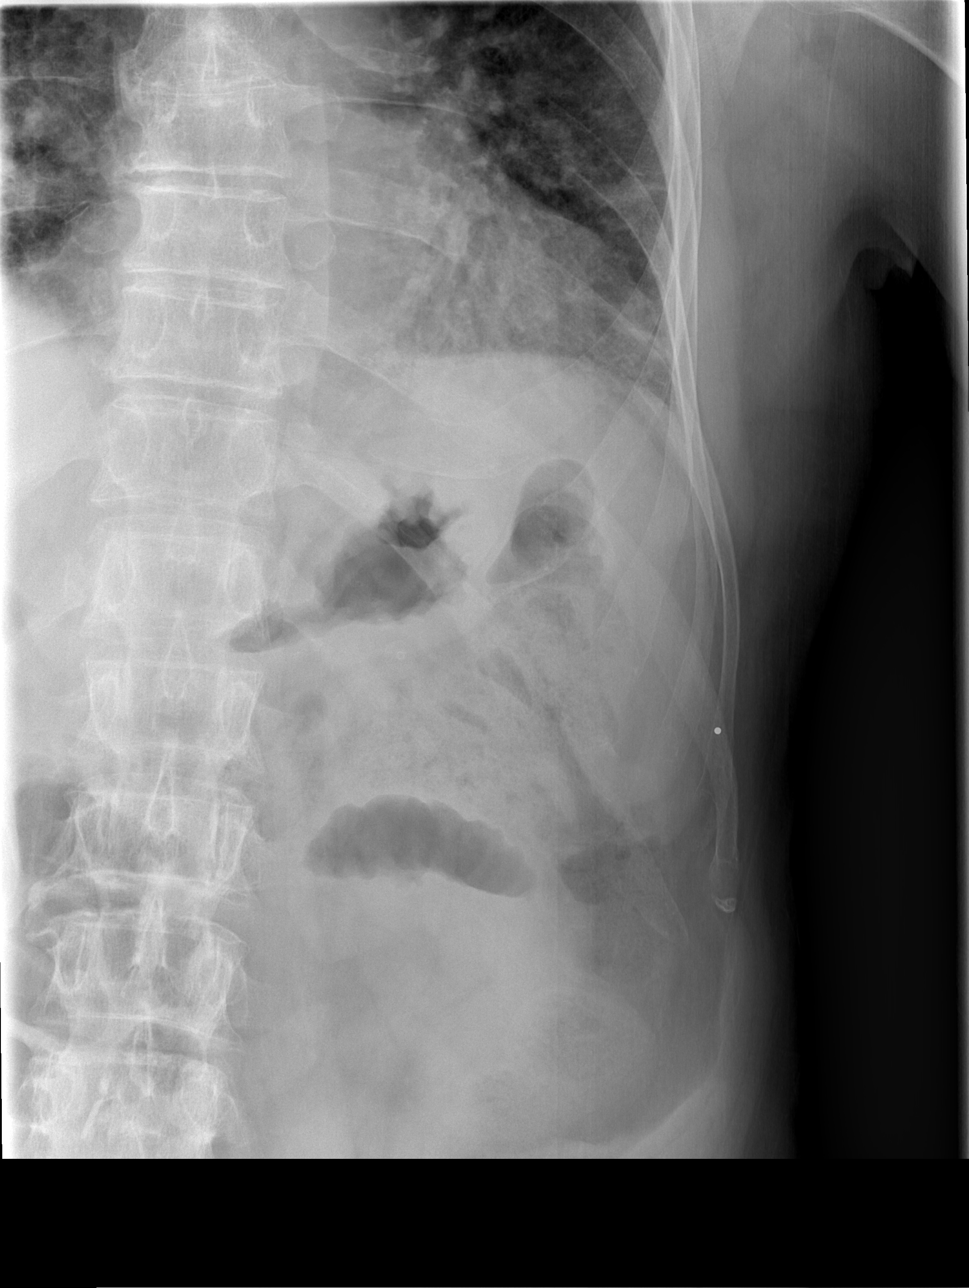

[w ribs oblique left]
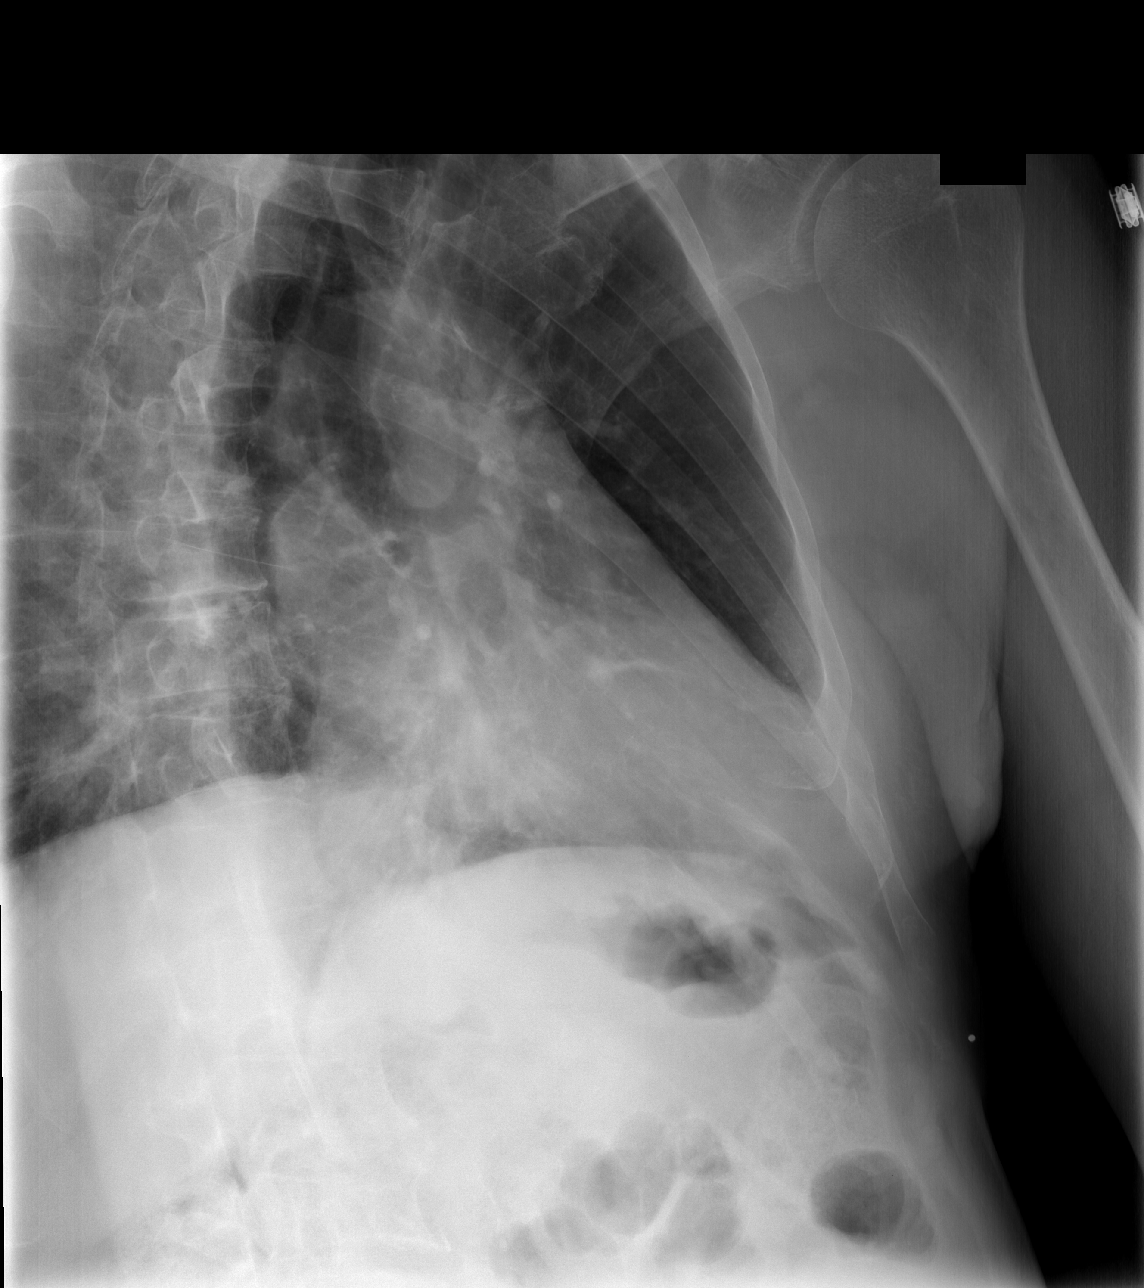

[4 of 4 positions shown; findings below may reference images not displayed]

FINDINGS: No fracture or other bone lesions are seen involving the ribs. There
is no evidence of pneumothorax or pleural effusion. The cardiac
silhouette is moderately enlarged. Discoid atelectasis versus
scarring left lung base. No focal regions of consolidation or focal
infiltrates.
IMPRESSION: Discoid atelectasis versus scarring left lung base. No acute osseous
abnormalities.

## 2013-12-21 MED ORDER — IBUPROFEN 800 MG PO TABS: 800.0000 mg | ORAL_TABLET | Freq: Three times a day (TID) | ORAL | Status: DC

## 2013-12-21 NOTE — Discharge Instructions (Signed)
Contusion  Followup with your doctor tomorrow regarding her blood sugar. Return to the ED for develop worsening pain, confusion, nausea, vomiting or any other concerns. A contusion is a deep bruise. Contusions are the result of an injury that caused bleeding under the skin. The contusion may turn blue, purple, or yellow. Minor injuries will give you a painless contusion, but more severe contusions may stay painful and swollen for a few weeks.  CAUSES  A contusion is usually caused by a blow, trauma, or direct force to an area of the body. SYMPTOMS   Swelling and redness of the injured area.  Bruising of the injured area.  Tenderness and soreness of the injured area.  Pain. DIAGNOSIS  The diagnosis can be made by taking a history and physical exam. An X-ray, CT scan, or MRI may be needed to determine if there were any associated injuries, such as fractures. TREATMENT  Specific treatment will depend on what area of the body was injured. In general, the best treatment for a contusion is resting, icing, elevating, and applying cold compresses to the injured area. Over-the-counter medicines may also be recommended for pain control. Ask your caregiver what the best treatment is for your contusion. HOME CARE INSTRUCTIONS   Put ice on the injured area.  Put ice in a plastic bag.  Place a towel between your skin and the bag.  Leave the ice on for 15-20 minutes, 03-04 times a day.  Only take over-the-counter or prescription medicines for pain, discomfort, or fever as directed by your caregiver. Your caregiver may recommend avoiding anti-inflammatory medicines (aspirin, ibuprofen, and naproxen) for 48 hours because these medicines may increase bruising.  Rest the injured area.  If possible, elevate the injured area to reduce swelling. SEEK IMMEDIATE MEDICAL CARE IF:   You have increased bruising or swelling.  You have pain that is getting worse.  Your swelling or pain is not relieved with  medicines. MAKE SURE YOU:   Understand these instructions.  Will watch your condition.  Will get help right away if you are not doing well or get worse. Document Released: 05/03/2005 Document Revised: 10/16/2011 Document Reviewed: 05/29/2011 [facility] Patient Information 2014 [place], [place].  Hyperglycemia Hyperglycemia occurs when the glucose (sugar) in your blood is too high. Hyperglycemia can happen for many reasons, but it most often happens to people who do not know they have diabetes or are not managing their diabetes properly.  CAUSES  Whether you have diabetes or not, there are other causes of hyperglycemia. Hyperglycemia can occur when you have diabetes, but it can also occur in other situations that you might not be as aware of, such as: Diabetes  If you have diabetes and are having problems controlling your blood glucose, hyperglycemia could occur because of some of the following reasons:  Not following your meal plan.  Not taking your diabetes medications or not taking it properly.  Exercising less or doing less activity than you normally do.  Being sick. Pre-diabetes  This cannot be ignored. Before people develop Type 2 diabetes, they almost always have "pre-diabetes." This is when your blood glucose levels are higher than normal, but not yet high enough to be diagnosed as diabetes. Research has shown that some long-term damage to the body, especially the heart and circulatory system, may already be occurring during pre-diabetes. If you take action to manage your blood glucose when you have pre-diabetes, you may delay or prevent Type 2 diabetes from developing. Stress  If you have  diabetes, you may be "diet" controlled or on oral medications or insulin to control your diabetes. However, you may find that your blood glucose is higher than usual in the hospital whether you have diabetes or not. This is often referred to as "stress hyperglycemia." Stress can elevate your  blood glucose. This happens because of hormones put out by the body during times of stress. If stress has been the cause of your high blood glucose, it can be followed regularly by your caregiver. That way he/she can make sure your hyperglycemia does not continue to get worse or progress to diabetes. Steroids  Steroids are medications that act on the infection fighting system (immune system) to block inflammation or infection. One side effect can be a rise in blood glucose. Most people can produce enough extra insulin to allow for this rise, but for those who cannot, steroids make blood glucose levels go even higher. It is not unusual for steroid treatments to "uncover" diabetes that is developing. It is not always possible to determine if the hyperglycemia will go away after the steroids are stopped. A special blood test called an A1c is sometimes done to determine if your blood glucose was elevated before the steroids were started. SYMPTOMS  Thirsty.  Frequent urination.  Dry mouth.  Blurred vision.  Tired or fatigue.  Weakness.  Sleepy.  Tingling in feet or leg. DIAGNOSIS  Diagnosis is made by monitoring blood glucose in one or all of the following ways:  A1c test. This is a chemical found in your blood.  Fingerstick blood glucose monitoring.  Laboratory results. TREATMENT  First, knowing the cause of the hyperglycemia is important before the hyperglycemia can be treated. Treatment may include, but is not be limited to:  Education.  Change or adjustment in medications.  Change or adjustment in meal plan.  Treatment for an illness, infection, etc.  More frequent blood glucose monitoring.  Change in exercise plan.  Decreasing or stopping steroids.  Lifestyle changes. HOME CARE INSTRUCTIONS   Test your blood glucose as directed.  Exercise regularly. Your caregiver will give you instructions about exercise. Pre-diabetes or diabetes which comes on with stress is  helped by exercising.  Eat wholesome, balanced meals. Eat often and at regular, fixed times. Your caregiver or nutritionist will give you a meal plan to guide your sugar intake.  Being at an ideal weight is important. If needed, losing as little as 10 to 15 pounds may help improve blood glucose levels. SEEK MEDICAL CARE IF:   You have questions about medicine, activity, or diet.  You continue to have symptoms (problems such as increased thirst, urination, or weight gain). SEEK IMMEDIATE MEDICAL CARE IF:   You are vomiting or have diarrhea.  Your breath smells fruity.  You are breathing faster or slower.  You are very sleepy or incoherent.  You have numbness, tingling, or pain in your feet or hands.  You have chest pain.  Your symptoms get worse even though you have been following your caregiver's orders.  If you have any other questions or concerns. Document Released: 01/17/2001 Document Revised: 10/16/2011 Document Reviewed: 11/20/2011 [facility] Patient Information 2014 [place], [place].

## 2013-12-21 NOTE — ED Notes (Signed)
Pt reports tripping and falling in a parking lot, hit head on asphalt, denies loc. Has abrasion to left eyebrow, abrasions noted to right wrist and reports mild pain to left elbow. Is not on blood thinners, no acute distress noted at triage.

## 2013-12-21 NOTE — ED Provider Notes (Signed)
CSN: [id number]     Arrival date & time 12/21/13  1245 History   First MD Initiated Contact with Patient 12/21/13 1557     Chief Complaint  Patient presents with  . Fall     (Consider location/radiation/quality/duration/timing/severity/associated sxs/prior Treatment) HPI Comments: Patient presents after trip and fall [place] the parking [place]. He states he was walking and tried to step he said somebody and tripped over his feet and hit his head on the concrete. Did not lose consciousness. Denies any dizziness, lightheadedness, chest pain or shortness of breath. He complains of headache, right knee pain left elbow pain. He is not on any blood thinners. He denies any focal dizziness, lightheadedness or syncope. Denies abdominal pain, neck pain or back pain.  The history is provided by the patient and a relative.    Past Medical History  Diagnosis Date  . Basal cell cancer   . Diabetes mellitus without complication   . Cataract    Past Surgical History  Procedure Laterality Date  . Hernia repair    . Prostate surgery    . Eye surgery     History reviewed. No pertinent family history. History  Substance Use Topics  . Smoking status: Never Smoker   . Smokeless tobacco: Not on file  . Alcohol Use: No    Review of Systems  Constitutional: Negative for fever, activity change and appetite change.  HENT: Negative for congestion and rhinorrhea.   Respiratory: Negative for cough, chest tightness and shortness of breath.   Cardiovascular: Negative for chest pain.  Gastrointestinal: Negative for nausea, vomiting and abdominal pain.  Genitourinary: Negative for dysuria, urgency and hematuria.  Musculoskeletal: Positive for arthralgias and myalgias. Negative for back pain.  Skin: Negative for rash.  Neurological: Positive for headaches. Negative for dizziness and weakness.  A complete 10 system review of systems was obtained and all systems are negative except as noted in the HPI and PMH.       Allergies  Bee venom and Metformin and related  Home Medications   Prior to Admission medications   Medication Sig Start Date End Date Taking? Authorizing Provider  azithromycin (ZITHROMAX) 250 MG tablet Use as directed 06/24/13   Orma Flaming, MD  citalopram (CELEXA) 40 MG tablet Take 20 mg by mouth daily.     Historical Provider, MD  fluticasone (FLONASE) 50 MCG/ACT nasal spray Place 2 sprays into the nose daily. 06/24/12   Posey Boyer, MD  glipiZIDE (GLUCOTROL) 10 MG tablet Take 10 mg by mouth 2 (two) times daily.    Historical Provider, MD  levofloxacin (LEVAQUIN) 500 MG tablet Take 1 tablet (500 mg total) by mouth daily. 06/28/13   Wendie Agreste, MD  losartan (COZAAR) 100 MG tablet Take 100 mg by mouth daily.    Historical Provider, MD  metFORMIN (GLUCOPHAGE) 500 MG tablet Take by mouth. 250 mg in the a.m, 500 mg at night    Historical Provider, MD  pioglitazone (ACTOS) 15 MG tablet Take 15 mg by mouth daily.    Historical Provider, MD  simvastatin (ZOCOR) 40 MG tablet Take 40 mg by mouth every evening.    Historical Provider, MD   BP 120/64  Pulse 63  Temp(Src) 97.5 F (36.4 C) (Oral)  Resp 16  SpO2 99% Physical Exam  Constitutional: He is oriented to person, place, and time. He appears well-developed and well-nourished.  HENT:  Head: Normocephalic and atraumatic.  Mouth/Throat: Oropharynx is clear and moist. No oropharyngeal exudate.  Abrasion  to left temple EOMI  Eyes: Conjunctivae and EOM are normal. Pupils are equal, round, and reactive to light.  Neck: Normal range of motion. Neck supple.  No C spine pain  Cardiovascular: Normal rate, regular rhythm and normal heart sounds.   Pulmonary/Chest: Effort normal and breath sounds normal. No respiratory distress.  Abdominal: Soft. There is no tenderness. There is no rebound and no guarding.  Musculoskeletal: Normal range of motion. He exhibits no edema and no tenderness.  Abrasion right knee. Tenderness to  left olecranon Intact radial pulses bilaterally. No T. or L-spine tenderness   Neurological: He is alert and oriented to person, place, and time. No cranial nerve deficit. Coordination normal.  Skin: Skin is warm.    ED Course  Procedures (including critical care time) Labs Review Labs Reviewed  CBG MONITORING, ED - Abnormal; Notable for the following:    Glucose-Capillary 302 (*)    All other components within normal limits    Imaging Review Dg Ribs Unilateral W/chest Left  12/21/2013   CLINICAL DATA:  FALL FALL  EXAM: LEFT RIBS AND CHEST - 3+ VIEW  COMPARISON:  None.  FINDINGS: No fracture or other bone lesions are seen involving the ribs. There is no evidence of pneumothorax or pleural effusion. The cardiac silhouette is moderately enlarged. Discoid atelectasis versus scarring left lung base. No focal regions of consolidation or focal infiltrates.  IMPRESSION: Discoid atelectasis versus scarring left lung base. No acute osseous abnormalities.   Electronically Signed   By: Margaree Mackintosh M.D.   On: 12/21/2013 18:58   Dg Elbow Complete Left  12/21/2013   CLINICAL DATA:  FALL  EXAM: LEFT ELBOW - COMPLETE 3+ VIEW  COMPARISON:  None.  FINDINGS: There is no evidence of fracture, dislocation, or joint effusion. There is no evidence of arthropathy or other focal bone abnormality. Soft tissues are unremarkable.  IMPRESSION: Negative.   Electronically Signed   By: Margaree Mackintosh M.D.   On: 12/21/2013 17:32   Ct Head Wo Contrast  12/21/2013   CLINICAL DATA:  Fall.  EXAM: CT HEAD WITHOUT CONTRAST  TECHNIQUE: Contiguous axial images were obtained from the base of the skull through the vertex without intravenous contrast.  COMPARISON:  None  FINDINGS: No acute cortical infarct, hemorrhage, or mass lesion ispresent. Ventricles are of normal size. No significant extra-axial fluid collection is present. Moderate mucosal thickening involves the right maxillary sinus. There is partial opacification of the  right mastoid air cells.  IMPRESSION: 1. No acute intracranial abnormalities. 2. Right maxillary sinus inflammation   Electronically Signed   By: Kerby Moors M.D.   On: 12/21/2013 17:48   Dg Knee Complete 4 Views Right  12/21/2013   CLINICAL DATA:  FALL  EXAM: RIGHT KNEE - COMPLETE 4+ VIEW  COMPARISON:  None.  FINDINGS: There is no evidence of fracture, dislocation, or joint effusion. Areas of mild, peripheral periarticular hypertrophic spurring. Soft tissues are unremarkable.  IMPRESSION: Mild osteoarthritic changes no acute osseous abnormalities.   Electronically Signed   By: Margaree Mackintosh M.D.   On: 12/21/2013 17:34     EKG Interpretation   Date/Time:  Sunday Dec 21 2013 16:52:57 EDT Ventricular Rate:  70 PR Interval:  167 QRS Duration: 100 QT Interval:  393 QTC Calculation: 424 R Axis:   -27 Text Interpretation:  Sinus rhythm Borderline left axis deviation Abnormal  R-wave progression, late transition No previous ECGs available Confirmed  by Wyvonnia Dusky  MD, Merlinda Wrubel [phone]) on 12/21/2013 5:30:39 PM  MDM   Final diagnoses:  Fall  Multiple contusions  Hyperglycemia   Mechanical trip and fall with contusion to left head, left elbow and right knee. No loss of consciousness. No dizziness or lightheadedness. No anticoagulant use.  Tetanus up-to-date.  CT head negative. Elbow and knee x-ray negative. Wounds clean.  Patient is able to ambulate and tolerate by mouth.  On reevaluation, patient complaining of left lateral rib pain. Denies any shortness of breath. FAST scan is negative. Blood sugar is 300. Patient states he has appointment with his PCP tomorrow to address possibly going on insulin. Rib xrays negative. Return precautions discussed.  BP 120/64  Pulse 63  Temp(Src) 97.5 F (36.4 C) (Oral)  Resp 16  SpO2 99%   EMERGENCY DEPARTMENT [place] FAST EXAM  INDICATIONS:Blunt trauma to the Thorax  PERFORMED BY: Myself  IMAGES ARCHIVED?: Yes  FINDINGS: All views  negative  LIMITATIONS:  Emergent procedure  INTERPRETATION:  No abdominal free fluid and No pericardial effusion  COMMENT:      Ezequiel Essex, MD 12/22/13 [phone]

## 2013-12-21 NOTE — ED Notes (Signed)
Pt reports L elbow pain and R knee pain during reassessment.

## 2014-01-08 ENCOUNTER — Encounter: Payer: Self-pay | Admitting: *Deleted

## 2014-01-09 ENCOUNTER — Ambulatory Visit (INDEPENDENT_AMBULATORY_CARE_PROVIDER_SITE_OTHER): Payer: Medicare Other | Admitting: Neurology

## 2014-01-09 ENCOUNTER — Ambulatory Visit (INDEPENDENT_AMBULATORY_CARE_PROVIDER_SITE_OTHER): Payer: Medicare Other

## 2014-01-09 ENCOUNTER — Telehealth: Payer: Self-pay | Admitting: Neurology

## 2014-01-09 ENCOUNTER — Encounter: Payer: Self-pay | Admitting: Neurology

## 2014-01-09 VITALS — BP 119/65 | HR 73 | Ht 69.5 in | Wt 179.0 lb

## 2014-01-09 DIAGNOSIS — R209 Unspecified disturbances of skin sensation: Secondary | ICD-10-CM

## 2014-01-09 DIAGNOSIS — G5600 Carpal tunnel syndrome, unspecified upper limb: Secondary | ICD-10-CM

## 2014-01-09 DIAGNOSIS — D518 Other vitamin B12 deficiency anemias: Secondary | ICD-10-CM

## 2014-01-09 NOTE — Procedures (Signed)
     HISTORY:  Alby Schwabe is a 73 year old gentleman with a history of diabetes with a one month history of numbness in the distal tip of the right middle finger. The patient is being evaluated for a possible neuropathy. He denies discomfort in the neck or down arms.  NERVE CONDUCTION STUDIES:  Nerve conduction studies were performed on both upper extremities. The distal motor latencies for the median nerves were prolonged bilaterally, with a low motor amplitude on the right, normal on the left. The distal motor latencies and motor amplitudes for the ulnar nerves were normal bilaterally. The F wave latencies for the median nerves were prolonged bilaterally, normal for the ulnar nerves bilaterally. The nerve conduction velocities for the median and ulnar nerves were normal bilaterally. The sensory latencies for the median nerves were prolonged bilaterally, normal for the ulnar nerves bilaterally.  EMG STUDIES:  EMG evaluation was not performed.  IMPRESSION:  Nerve conduction studies done on both upper extremities shows evidence of bilateral carpal tunnel syndrome of moderate severity on the right, mild severity on the left. No other significant abnormalities were seen.  Jill Alexanders MD 01/09/2014 11:17 AM  [facility] [address] [place], [place] [address]  Phone [phone] Fax [phone]

## 2014-01-09 NOTE — Telephone Encounter (Signed)
I called patient. The nerve conduction study shows evidence of carpal tunnel syndrome bilaterally of moderate severity on the right, and mild severity on the left. The patient is to get wrist splints and wear them over the next 2-3 months. If no improvement is noted, he is to contact our office, and we will get him set up to see hand surgeon.

## 2014-01-09 NOTE — Progress Notes (Signed)
Reason for visit: Numbness  Tommy Peterson is a 73 y.o. male  History of present illness:  Tommy Peterson is a 73 year old left-handed white male with a history of diabetes. He reports relatively sudden onset of numbness of the very distal tip of the middle finger on the right hand one month ago. Since that time, the numbness has been stable. He reports no discomfort in the hand. He reports no weakness in the arm, or numbness on the face or the legs or the body. He denies any neck discomfort or pain down the arms. He denies any balance issues or problems controlling the bowels or the bladder. The has not had problems dropping things from his hand. He denies any similar numbness on the left hand. He indicates that his diabetes has been under poor control recently, and he was just switched to insulin and taken off of his oral hypoglycemic agents. He indicates that his hemoglobin A1c has been running greater than 10. He has been sent to this office for an evaluation.  Past Medical History  Diagnosis Date  . Diabetes mellitus without complication   . Cataract   . Dyslipidemia   . Hypertension   . Basal cell cancer     forehead and right arm    Past Surgical History  Procedure Laterality Date  . Hernia repair      right inguinal  . Prostate surgery      BPH  . Cataract extraction      bilateral  . Tonsillectomy      Family History  Problem Relation Age of Onset  . Diabetes Mother   . Heart failure Mother     Social history:  reports that he has quit smoking. His smoking use included Cigarettes. He smoked 0.00 packs per day. He has never used smokeless tobacco. He reports that he does not drink alcohol or use illicit drugs.  Medications:  Current Outpatient Prescriptions on File Prior to Visit  Medication Sig Dispense Refill  . aspirin 81 MG tablet Take 81 mg by mouth daily.      . budesonide (ENTOCORT EC) 3 MG 24 hr capsule Take 6 mg by mouth daily as needed (for diarrhea).       . citalopram (CELEXA) 40 MG tablet Take 20 mg by mouth daily.      Marland Kitchen ibuprofen (ADVIL,MOTRIN) 800 MG tablet Take 1 tablet (800 mg total) by mouth 3 (three) times daily.  21 tablet  0  . losartan (COZAAR) 100 MG tablet Take 100 mg by mouth daily.      . simvastatin (ZOCOR) 40 MG tablet Take 40 mg by mouth every evening.       No current facility-administered medications on file prior to visit.      Allergies  Allergen Reactions  . Bee Venom Swelling  . Metformin And Related Diarrhea    ROS:  Out of a complete 14 system review of symptoms, the patient complains only of the following symptoms, and all other reviewed systems are negative.  Numbness Hearing loss Diarrhea Impotence  Blood pressure 119/65, pulse 73, height 5' 9.5" (1.765 m), weight 179 lb (81.194 kg).  Physical Exam  General: The patient is alert and cooperative at the time of the examination.  Eyes: Pupils are equal, round, and reactive to light. Discs are flat bilaterally.  Neck: The neck is supple, no carotid bruits are noted.  Respiratory: The respiratory examination is clear.  Cardiovascular: The cardiovascular examination reveals a regular rate  and rhythm, no obvious murmurs or rubs are noted.  Neuromuscular: The patient lacks about 30 of lateral rotation of the cervical spine bilaterally.  Skin: Extremities are without significant edema.  Neurologic Exam  Mental status: The patient is alert and oriented x 3 at the time of the examination. The patient has apparent normal recent and remote memory, with an apparently normal attention span and concentration ability.  Cranial nerves: Facial symmetry is present. There is good sensation of the face to pinprick and soft touch bilaterally. The strength of the facial muscles and the muscles to head turning and shoulder shrug are normal bilaterally. Speech is well enunciated, no aphasia or dysarthria is noted. Extraocular movements are full. Visual fields are  full. The tongue is midline, and the patient has symmetric elevation of the soft palate. No obvious hearing deficits are noted.  Motor: The motor testing reveals 5 over 5 strength of all 4 extremities. Good symmetric motor tone is noted throughout.  Sensory: Sensory testing is intact to pinprick, soft touch, vibration sensation, and position sense on all 4 extremities, with exception that there is a stocking pattern pinprick sensory deficit one half way up the legs bilaterally. No evidence of extinction is noted.  Coordination: Cerebellar testing reveals good finger-nose-finger and heel-to-shin bilaterally. Tinel sign at the wrists were negative bilaterally.  Gait and station: Gait is normal. Tandem gait is normal. Romberg is negative. No drift is seen.  Reflexes: Deep tendon reflexes are symmetric and normal bilaterally, with the exception that the right ankle jerk reflexes slightly depressed, normal on the left. Toes are equivocally upgoing bilaterally.   Assessment/Plan:  1. Right middle finger numbness  The patient may have a mild diabetic neuropathy, but there is no clear evidence of a carpal tunnel syndrome currently. His diabetes has been under poor control, and he may have a distal sensory branch lesion that may improve spontaneously over time. He will undergo some further blood work, and have nerve conduction studies of both hands. He will be followed on a conservative basis, if he worsens, he is to contact this office.  Addendum: Nerve conduction studies done today show bilateral carpal tunnel syndrome, moderate severity on the right, mild severity on the left. The patient is to use wrist splints over the next 2 or 3 months, if no improvement is noted, he is to contact our office.  Tommy Alexanders MD 01/09/2014 12:13 PM  [facility] [address] [place], [place] [address]  Phone [phone] Fax [phone]

## 2014-01-14 ENCOUNTER — Telehealth: Payer: Self-pay | Admitting: Neurology

## 2014-01-14 LAB — RHEUMATOID FACTOR

## 2014-01-14 LAB — IFE AND PE, SERUM
ALPHA2 GLOB SERPL ELPH-MCNC: 0.5 g/dL (ref 0.4–1.2)
Albumin SerPl Elph-Mcnc: 3.8 g/dL (ref 3.2–5.6)
Albumin/Glob SerPl: 1.5 (ref 0.7–2.0)
Alpha 1: 0.2 g/dL (ref 0.1–0.4)
B-GLOBULIN SERPL ELPH-MCNC: 0.8 g/dL (ref 0.6–1.3)
GAMMA GLOB SERPL ELPH-MCNC: 1.1 g/dL (ref 0.5–1.6)
GLOBULIN, TOTAL: 2.6 g/dL (ref 2.0–4.5)
IgA/Immunoglobulin A, Serum: 317 mg/dL (ref 91–414)
IgG (Immunoglobin G), Serum: 1216 mg/dL (ref 700–1600)
IgM (Immunoglobulin M), Srm: 134 mg/dL (ref 40–230)
M PROTEIN SERPL ELPH-MCNC: 0.2 g/dL — AB
Total Protein: 6.4 g/dL (ref 6.0–8.5)

## 2014-01-14 LAB — COPPER, SERUM: COPPER: 88 ug/dL (ref 72–166)

## 2014-01-14 LAB — VITAMIN B12: VITAMIN B 12: 420 pg/mL (ref 211–946)

## 2014-01-14 LAB — ANA W/REFLEX: ANA: NEGATIVE

## 2014-01-14 NOTE — Telephone Encounter (Signed)
I called patient. 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 We will need to follow this over time. The patient has bilateral carpal tunnel syndrome on nerve conduction studies.

## 2014-06-24 ENCOUNTER — Encounter: Payer: Self-pay | Admitting: Neurology

## 2014-06-30 ENCOUNTER — Encounter: Payer: Self-pay | Admitting: Neurology

## 2014-07-13 DIAGNOSIS — D126 Benign neoplasm of colon, unspecified: Secondary | ICD-10-CM | POA: Insufficient documentation

## 2014-08-12 ENCOUNTER — Ambulatory Visit
Admission: RE | Admit: 2014-08-12 | Discharge: 2014-08-12 | Disposition: A | Discharge status: Home / Self Care | Payer: Medicare Other | Source: Ambulatory Visit | Attending: Hematology & Oncology | Admitting: Hematology & Oncology

## 2014-08-12 ENCOUNTER — Other Ambulatory Visit: Payer: Self-pay | Admitting: Hematology & Oncology

## 2014-08-12 DIAGNOSIS — D472 Monoclonal gammopathy: Secondary | ICD-10-CM

## 2014-08-12 IMAGING — CR DG BONE SURVEY MET
8 of 10 series · 8 of 10 positions shown · non-contrast
Comparison: CT [DATE].

CLINICAL DATA: Monoclonal gammopathy.

EXAM:
METASTATIC BONE SURVEY

[view not recorded (1 of 8)]
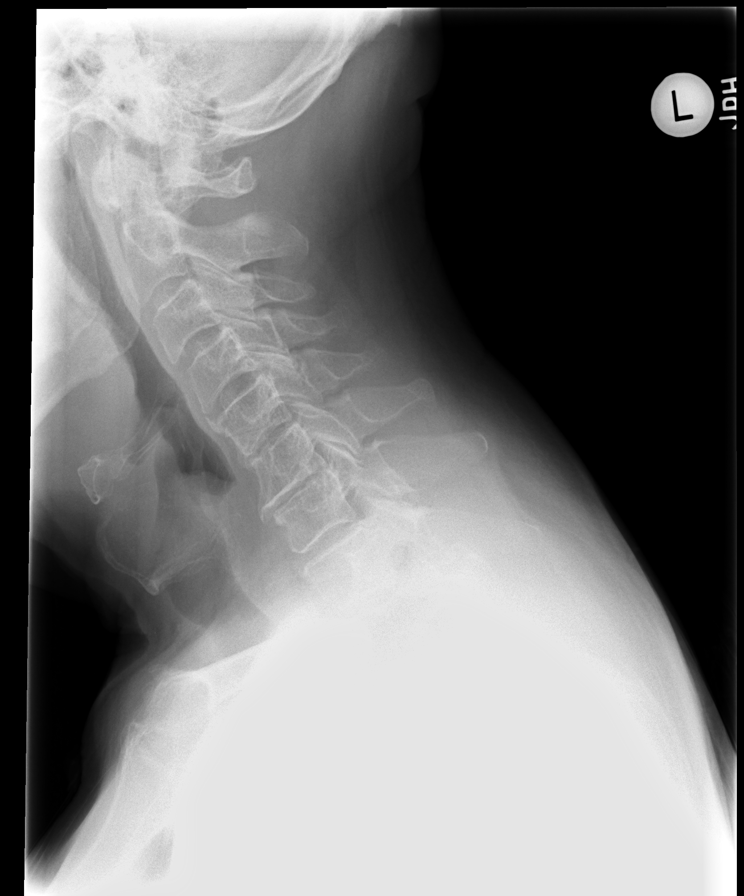

[view not recorded (2 of 8)]
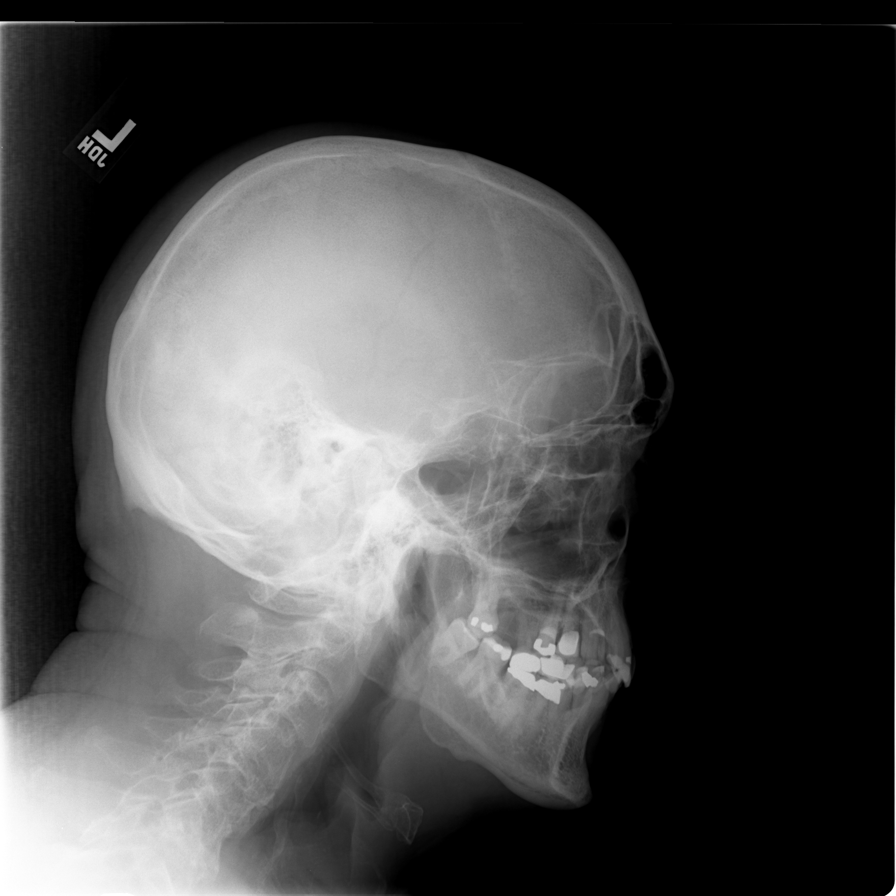

[view not recorded (3 of 8)]
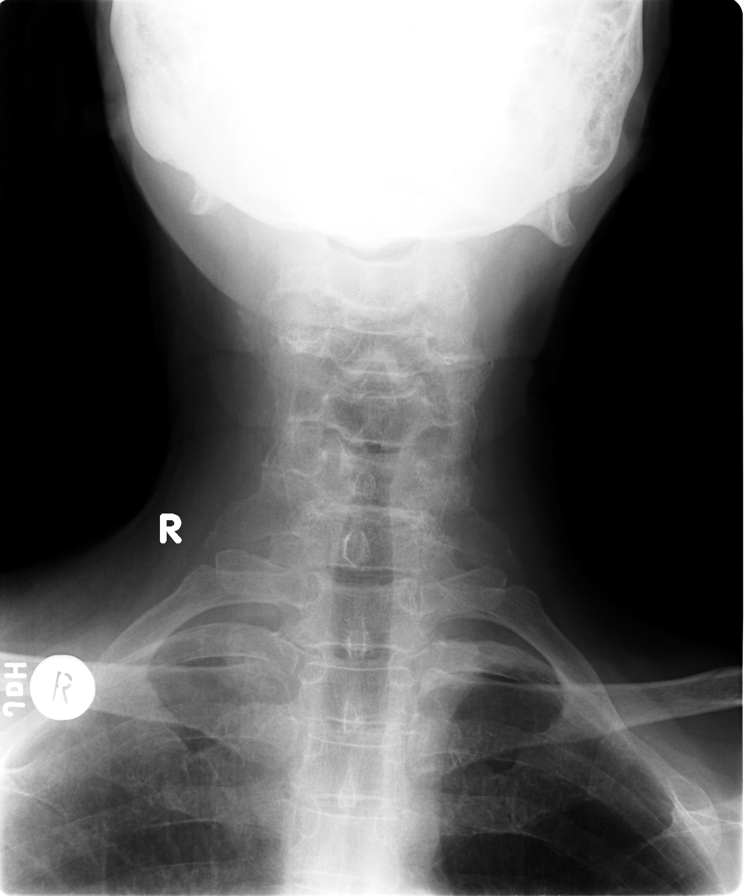

[view not recorded (4 of 8)]
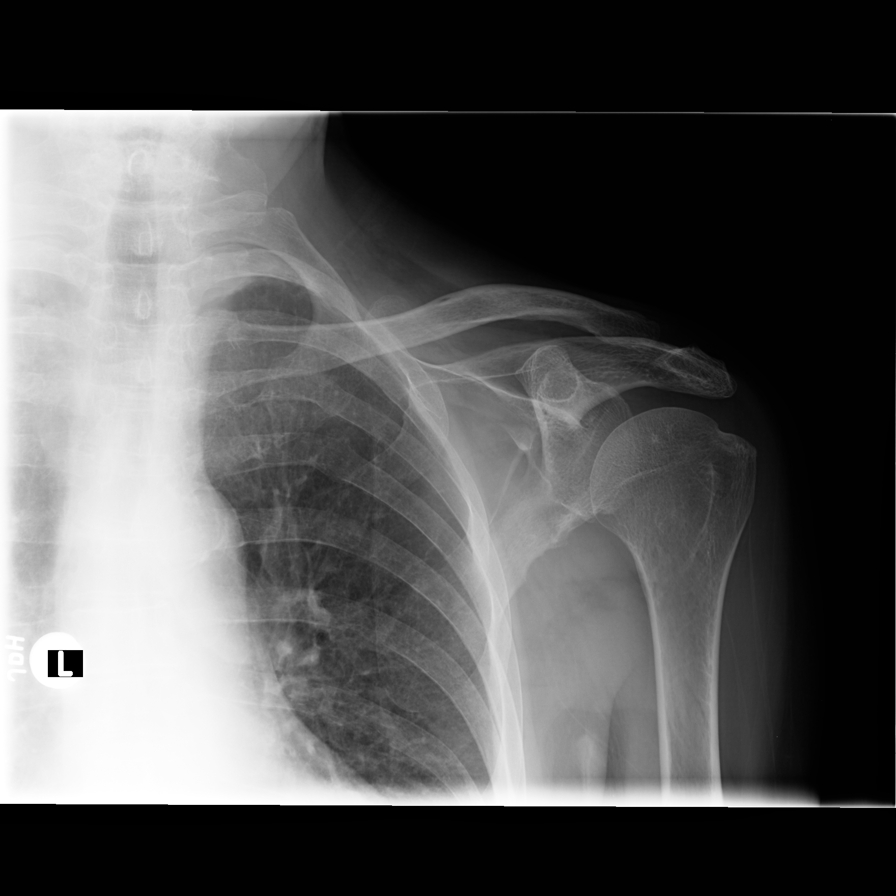

[view not recorded (5 of 8)]
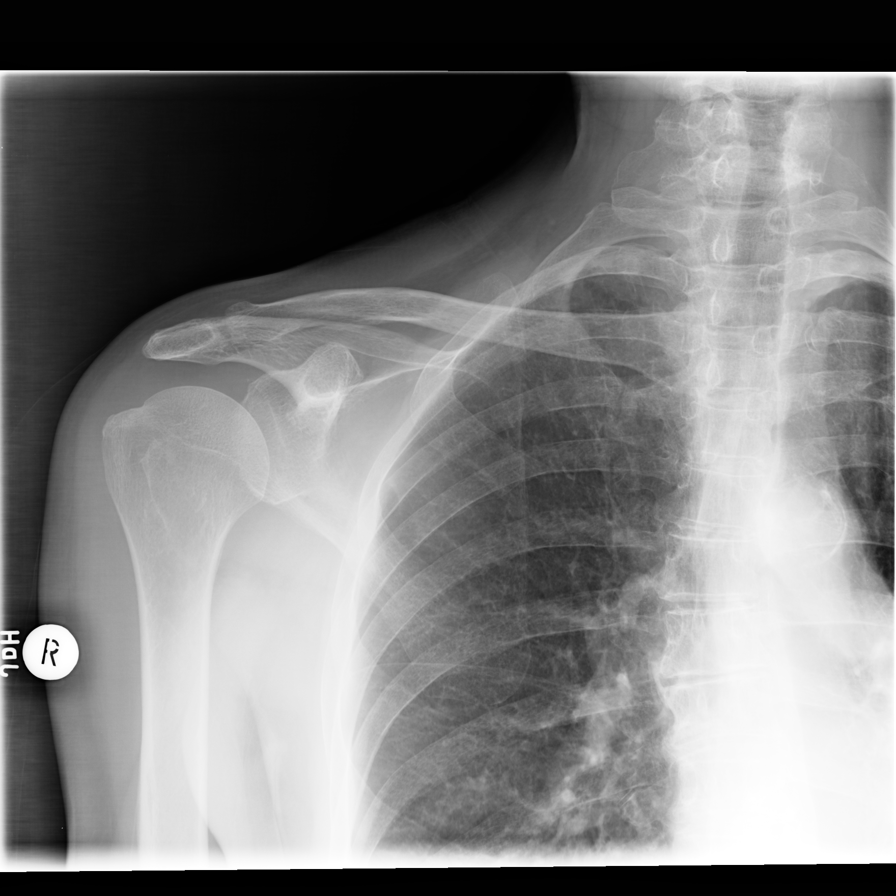

[view not recorded (6 of 8)]
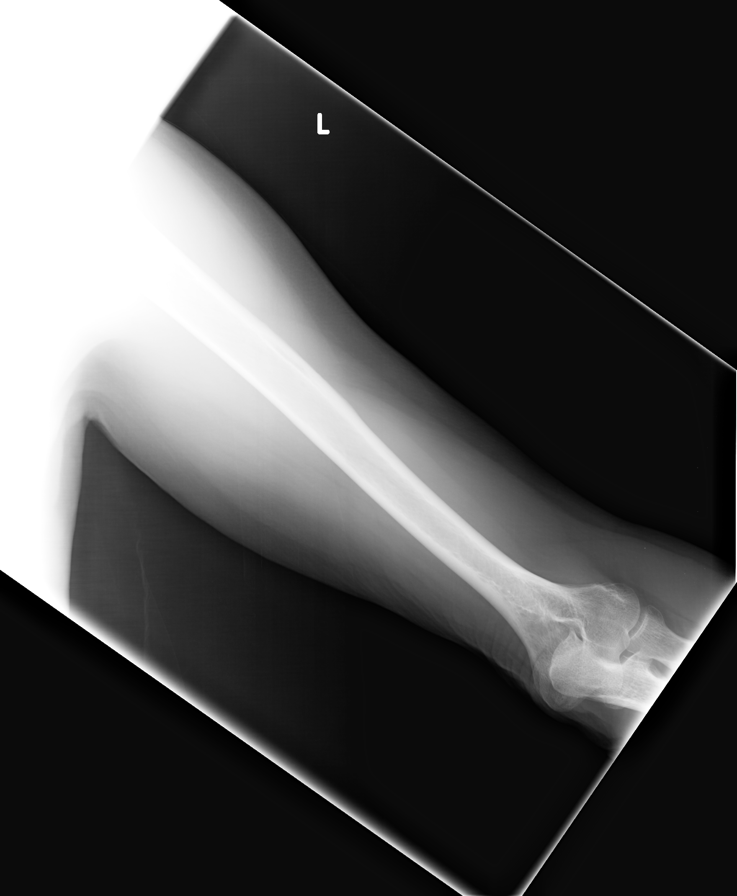

[view not recorded (7 of 8)]
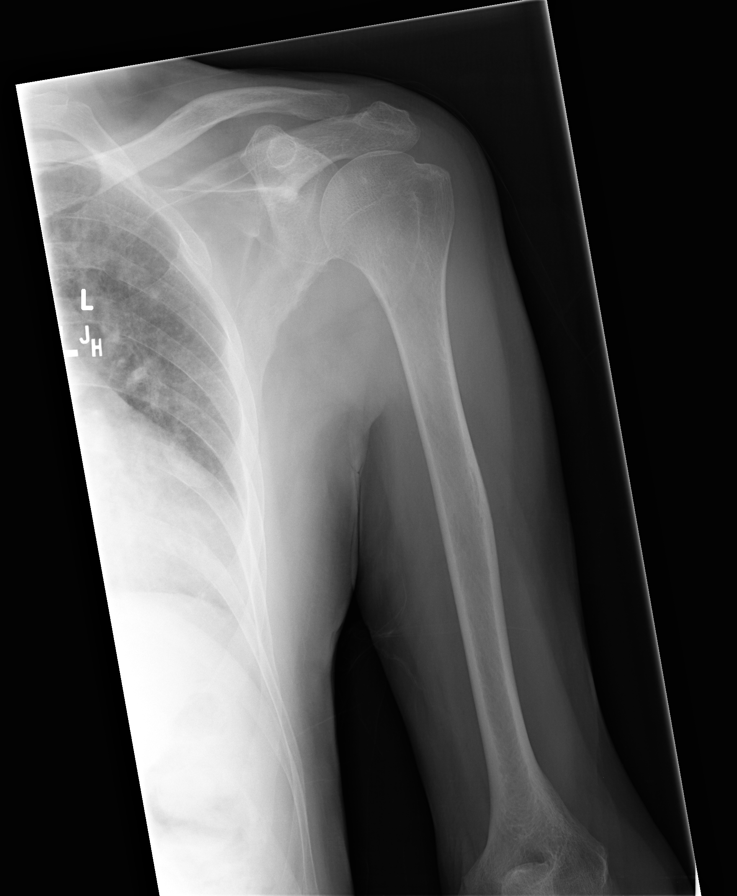

[view not recorded (8 of 8)]
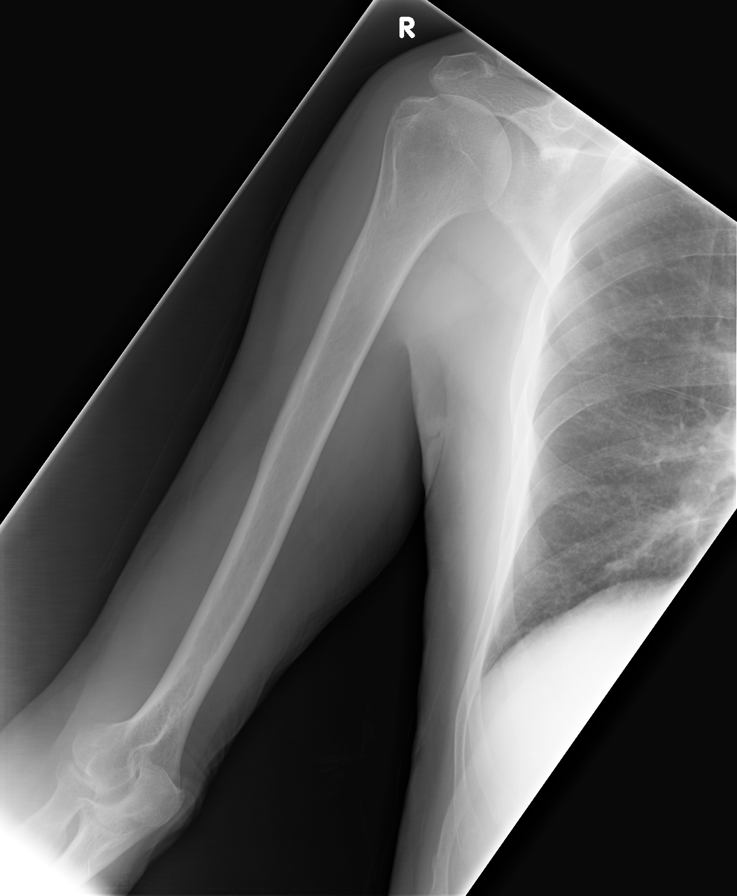

[8 of 10 positions shown; findings below may reference images not displayed]

FINDINGS: Carotid atherosclerotic vascular calcification noted. Aortic and
peripheral atherosclerotic vascular disease . Diffuse cervical and
thoracolumbar spine osteopenia degenerative change. No focal bony
lesion is noted to suggest myeloma or metastatic disease.
IMPRESSION: 1. Atherosclerotic vascular disease including carotid
atherosclerotic vascular disease.
2. No focal bony abnormality noted to suggest myeloma or metastatic
disease.

## 2014-09-08 DIAGNOSIS — D649 Anemia, unspecified: Secondary | ICD-10-CM | POA: Insufficient documentation

## 2015-08-01 ENCOUNTER — Emergency Department (HOSPITAL_COMMUNITY): Payer: Medicare Other

## 2015-08-01 ENCOUNTER — Emergency Department (HOSPITAL_COMMUNITY)
Admission: EM | Admit: 2015-08-01 | Discharge: 2015-08-02 | Disposition: A | Discharge status: Home / Self Care | Payer: Medicare Other | Attending: Emergency Medicine | Admitting: Emergency Medicine

## 2015-08-01 ENCOUNTER — Encounter (HOSPITAL_COMMUNITY): Payer: Self-pay | Admitting: Vascular Surgery

## 2015-08-01 DIAGNOSIS — Z87891 Personal history of nicotine dependence: Secondary | ICD-10-CM | POA: Diagnosis not present

## 2015-08-01 DIAGNOSIS — K529 Noninfective gastroenteritis and colitis, unspecified: Secondary | ICD-10-CM

## 2015-08-01 DIAGNOSIS — Z79899 Other long term (current) drug therapy: Secondary | ICD-10-CM | POA: Insufficient documentation

## 2015-08-01 DIAGNOSIS — E119 Type 2 diabetes mellitus without complications: Secondary | ICD-10-CM | POA: Diagnosis not present

## 2015-08-01 DIAGNOSIS — E860 Dehydration: Secondary | ICD-10-CM | POA: Diagnosis not present

## 2015-08-01 DIAGNOSIS — Z794 Long term (current) use of insulin: Secondary | ICD-10-CM | POA: Diagnosis not present

## 2015-08-01 DIAGNOSIS — Z8669 Personal history of other diseases of the nervous system and sense organs: Secondary | ICD-10-CM | POA: Diagnosis not present

## 2015-08-01 DIAGNOSIS — E785 Hyperlipidemia, unspecified: Secondary | ICD-10-CM | POA: Diagnosis not present

## 2015-08-01 DIAGNOSIS — I10 Essential (primary) hypertension: Secondary | ICD-10-CM | POA: Insufficient documentation

## 2015-08-01 DIAGNOSIS — Z85828 Personal history of other malignant neoplasm of skin: Secondary | ICD-10-CM | POA: Insufficient documentation

## 2015-08-01 DIAGNOSIS — Z7982 Long term (current) use of aspirin: Secondary | ICD-10-CM | POA: Insufficient documentation

## 2015-08-01 DIAGNOSIS — R197 Diarrhea, unspecified: Secondary | ICD-10-CM | POA: Diagnosis present

## 2015-08-01 LAB — CBC
HCT: 30.5 % — ABNORMAL LOW (ref 39.0–52.0)
Hemoglobin: 10 g/dL — ABNORMAL LOW (ref 13.0–17.0)
MCH: 36.8 pg — AB (ref 26.0–34.0)
MCHC: 32.8 g/dL (ref 30.0–36.0)
MCV: 112.1 fL — ABNORMAL HIGH (ref 78.0–100.0)
PLATELETS: 393 10*3/uL (ref 150–400)
RBC: 2.72 MIL/uL — ABNORMAL LOW (ref 4.22–5.81)
RDW: 16.1 % — ABNORMAL HIGH (ref 11.5–15.5)
WBC: 19.7 10*3/uL — ABNORMAL HIGH (ref 4.0–10.5)

## 2015-08-01 LAB — COMPREHENSIVE METABOLIC PANEL
ALBUMIN: 3.2 g/dL — AB (ref 3.5–5.0)
ALK PHOS: 67 U/L (ref 38–126)
ALT: 11 U/L — AB (ref 17–63)
AST: 16 U/L (ref 15–41)
Anion gap: 10 (ref 5–15)
BUN: 13 mg/dL (ref 6–20)
CALCIUM: 8.6 mg/dL — AB (ref 8.9–10.3)
CO2: 24 mmol/L (ref 22–32)
CREATININE: 0.62 mg/dL (ref 0.61–1.24)
Chloride: 102 mmol/L (ref 101–111)
GFR calc Af Amer: >60 mL/min (ref >60)
GFR calc non Af Amer: >60 mL/min (ref >60)
GLUCOSE: 206 mg/dL — AB (ref 65–99)
Potassium: 3.4 mmol/L — ABNORMAL LOW (ref 3.5–5.1)
SODIUM: 136 mmol/L (ref 135–145)
Total Bilirubin: 1 mg/dL (ref 0.3–1.2)
Total Protein: 6.2 g/dL — ABNORMAL LOW (ref 6.5–8.1)

## 2015-08-01 LAB — LIPASE, BLOOD: Lipase: 18 U/L (ref 11–51)

## 2015-08-01 LAB — URINE MICROSCOPIC-ADD ON

## 2015-08-01 LAB — URINALYSIS, ROUTINE W REFLEX MICROSCOPIC
Glucose, UA: 250 mg/dL — AB
HGB URINE DIPSTICK: NEGATIVE
Ketones, ur: 15 mg/dL — AB
Nitrite: NEGATIVE
PROTEIN: 30 mg/dL — AB
SPECIFIC GRAVITY, URINE: 1.03 (ref 1.005–1.030)
pH: 5.5 (ref 5.0–8.0)

## 2015-08-01 IMAGING — CT CT ABD-PELV W/ CM
2 of 5 series · 9 of 46 positions shown, 10 images · IV contrast (Iodine)
Comparison: None.

CLINICAL DATA: Diarrhea for 14 months. Nausea and vomiting today.
Leukocytosis. History of hernia repair, diabetes, hypertension,
dyslipidemia.

EXAM:
CT ABDOMEN AND PELVIS WITH CONTRAST
TECHNIQUE: Multidetector CT imaging of the abdomen and pelvis was performed
using the standard protocol following bolus administration of
intravenous contrast.
CONTRAST:  100mL OMNIPAQUE IOHEXOL 300 MG/ML  SOLN

[Series 201: routine, idose (2) · axial · 0.79mm/px · z∈[-442,-37]mm · 6 of 103 slices shown, 7 images]
[im 11/103  soft-tissue]
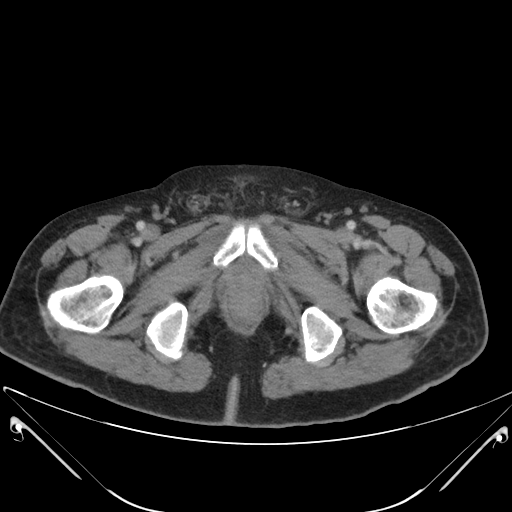
[im 11/103  bone]
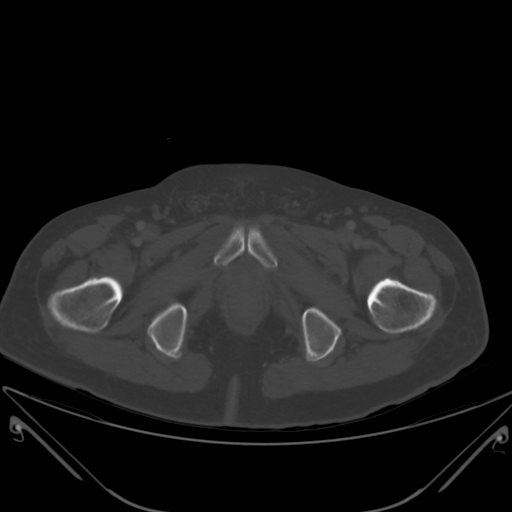
[im 27/103  soft-tissue]
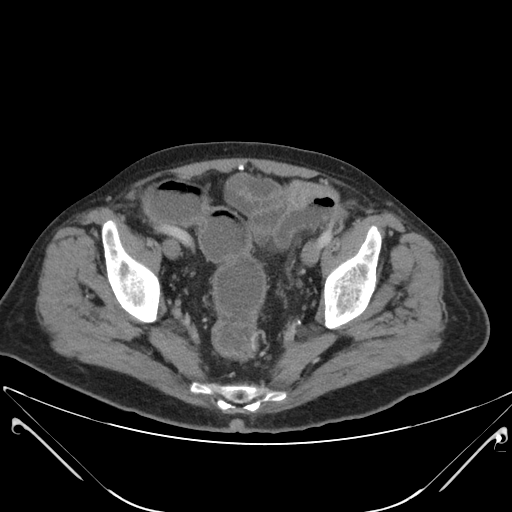
[im 43/103  soft-tissue]
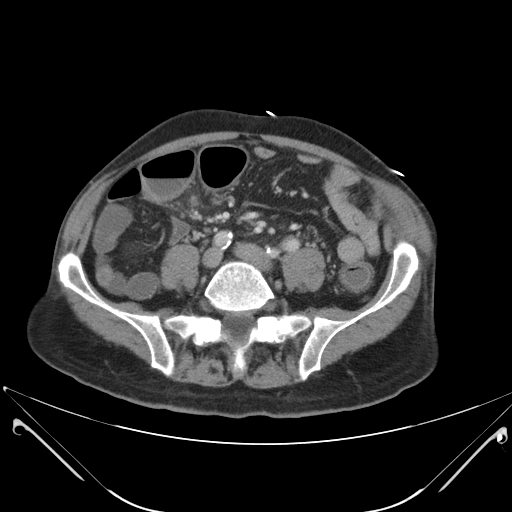
[im 60/103  soft-tissue]
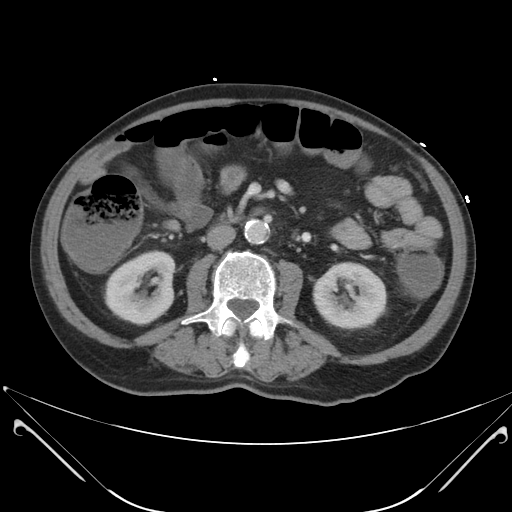
[im 76/103  soft-tissue]
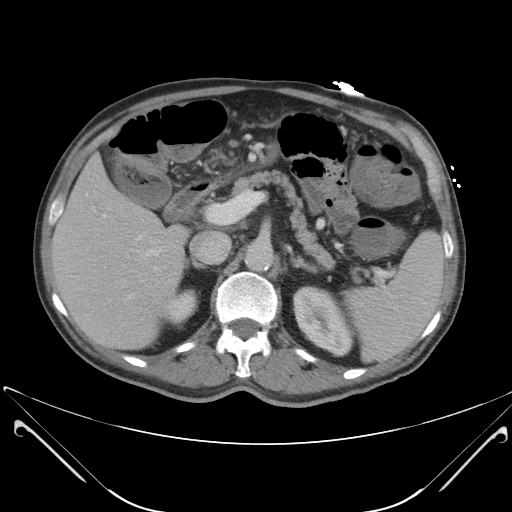
[im 92/103  soft-tissue]
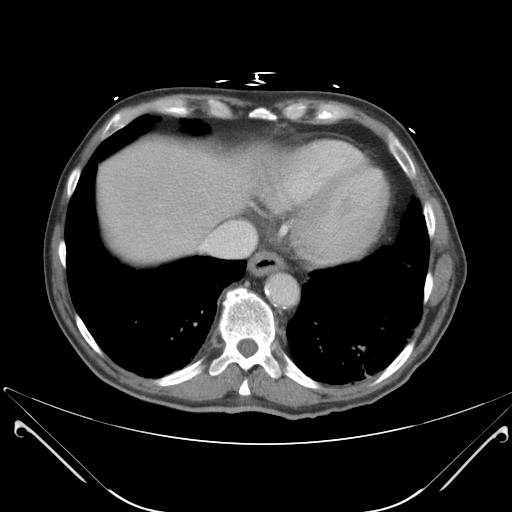

[Series 203: coronals, idose (2) · coronal · 0.45mm/px · 3 of 141 slices shown]
[im 47/141  soft-tissue]
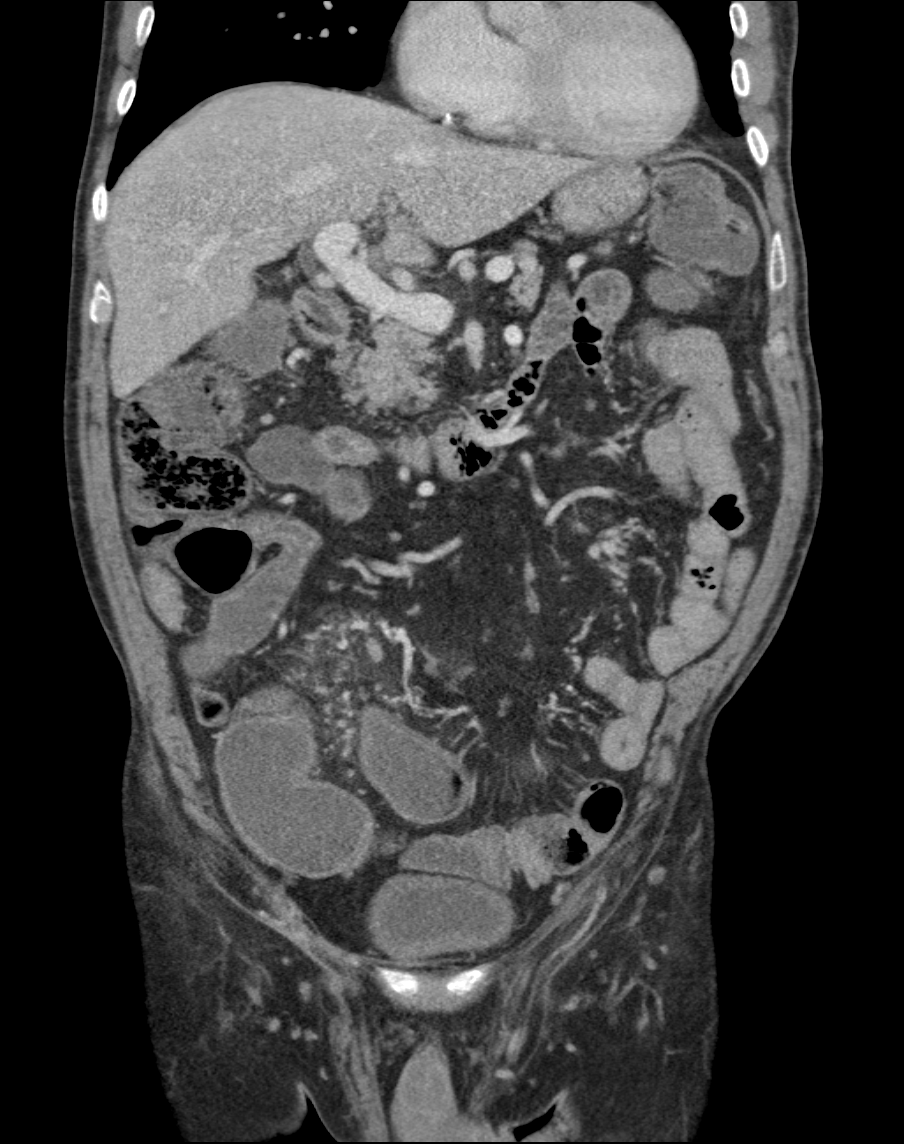
[im 63/141  soft-tissue]
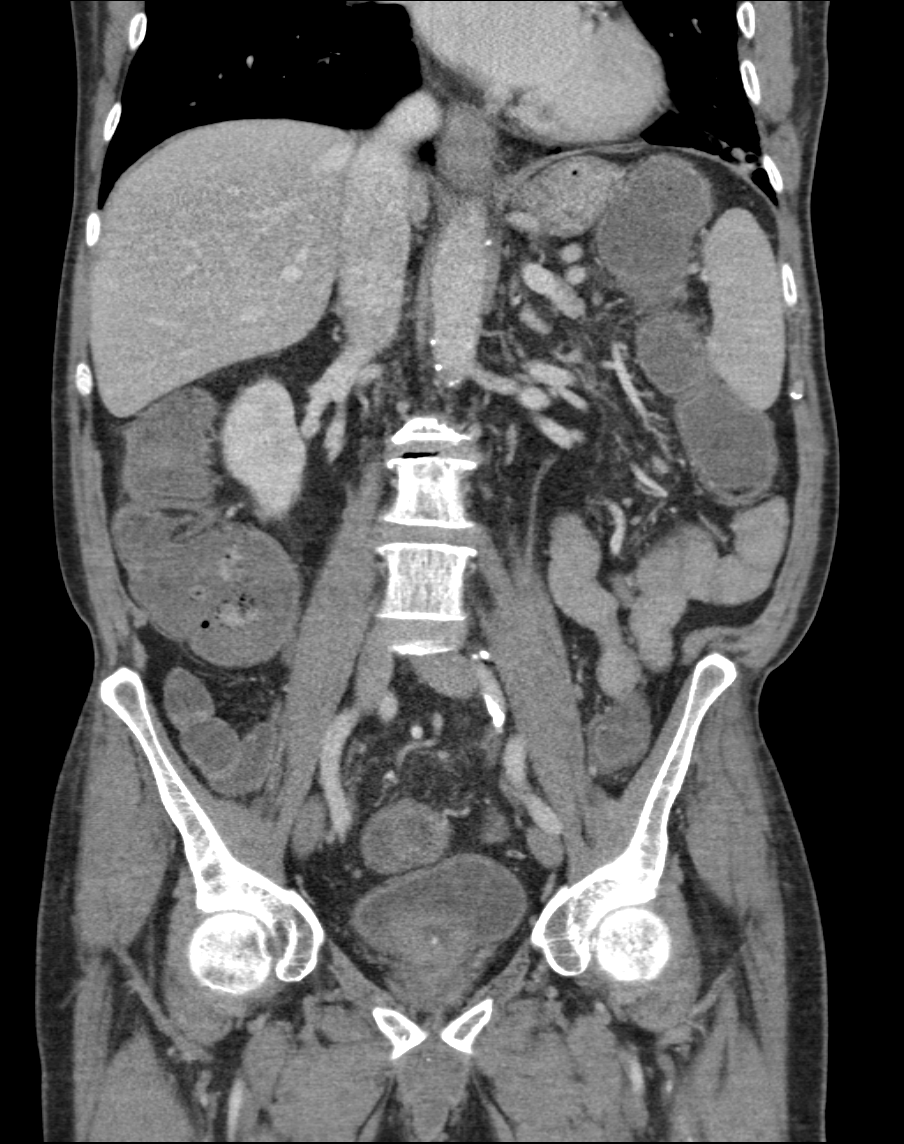
[im 78/141  soft-tissue]
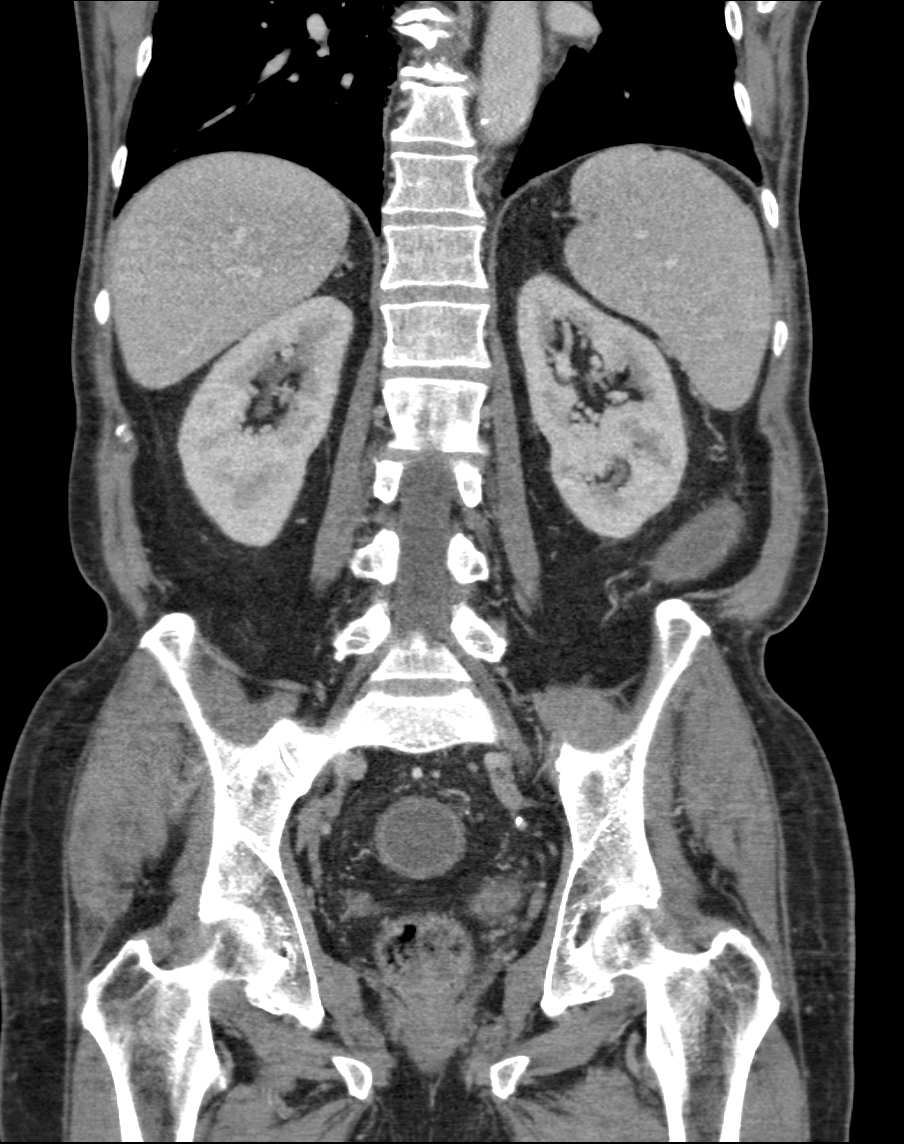

[9 of 46 positions shown; findings below may reference images not displayed]

FINDINGS: LUNG BASES: Atelectasis/scarring in the LEFT greater than RIGHT
lower lobes, lingula. No pleural effusion or focal consolidation.
Heart size is normal. No pericardial effusions. Mild coronary artery
calcifications. Mildly thickened distal esophagus.

SOLID ORGANS: The liver demonstrates subcentimeter probable cyst in
dome of the liver, otherwise unremarkable. Spleen, gallbladder,
pancreas and adrenal glands are unremarkable.

GASTROINTESTINAL TRACT: Air-fluid levels throughout the colon to the
level the rectum. Mild circumferential descending, sigmoid and
rectal wall thickening with minimal pericolonic inflammation. The
stomach, small bowel are normal in course and caliber without
inflammatory changes though sensitivity decreased by lack of enteric
contrast. Normal appendix.

KIDNEYS/ URINARY TRACT: Kidneys are orthotopic, demonstrating
symmetric enhancement. No nephrolithiasis, hydronephrosis or solid
renal masses. Too small to characterize hypodensities in the kidneys
bilaterally. The unopacified ureters are normal in course and
caliber. Delayed imaging through the kidneys demonstrates symmetric
prompt contrast excretion within the proximal urinary collecting
system. Urinary bladder is partially distended and unremarkable.

PERITONEUM/RETROPERITONEUM: Aortoiliac vessels are normal in course
and caliber, moderate calcific atherosclerosis. No lymphadenopathy
by CT size criteria. Subcentimeter scattered lymph nodes in the
pelvis are likely reactive. Prostate is enlarged, invading the base
of the bladder. No intraperitoneal free fluid nor free air.

SOFT TISSUE/OSSEOUS STRUCTURES: Non-suspicious. Small fat containing
inguinal hernias.
IMPRESSION: Nonspecific mild colitis, with colonic air-fluid levels suggesting
mild ileus. No bowel obstruction.

Mildly thickened distal esophagus could represent redundancy, though
nonspecific without superimposed inflammatory changes.

## 2015-08-01 MED ORDER — ONDANSETRON 4 MG PO TBDP: ORAL_TABLET | ORAL | Status: DC

## 2015-08-01 MED ORDER — CIPROFLOXACIN IN D5W 400 MG/200ML IV SOLN
400.0000 mg | Freq: Once | INTRAVENOUS | Status: AC
  Administered 2015-08-02: 400 mg via INTRAVENOUS
  Filled 2015-08-01: qty 200

## 2015-08-01 MED ORDER — ONDANSETRON 4 MG PO TBDP
ORAL_TABLET | ORAL | Status: AC
  Filled 2015-08-01: qty 1

## 2015-08-01 MED ORDER — METRONIDAZOLE 500 MG PO TABS: 500.0000 mg | ORAL_TABLET | Freq: Three times a day (TID) | ORAL | Status: DC

## 2015-08-01 MED ORDER — IOHEXOL 300 MG/ML  SOLN
100.0000 mL | Freq: Once | INTRAMUSCULAR | Status: AC | PRN
  Administered 2015-08-01: 100 mL via INTRAVENOUS

## 2015-08-01 MED ORDER — METRONIDAZOLE IN NACL 5-0.79 MG/ML-% IV SOLN
500.0000 mg | Freq: Once | INTRAVENOUS | Status: AC
  Administered 2015-08-02: 500 mg via INTRAVENOUS
  Filled 2015-08-01: qty 100

## 2015-08-01 MED ORDER — CIPROFLOXACIN HCL 500 MG PO TABS: 500.0000 mg | ORAL_TABLET | Freq: Two times a day (BID) | ORAL | Status: DC

## 2015-08-01 MED ORDER — ONDANSETRON 4 MG PO TBDP
4.0000 mg | ORAL_TABLET | Freq: Once | ORAL | Status: AC | PRN
  Administered 2015-08-01: 4 mg via ORAL

## 2015-08-01 MED ORDER — SODIUM CHLORIDE 0.9 % IV BOLUS (SEPSIS)
1000.0000 mL | Freq: Once | INTRAVENOUS | Status: AC
  Administered 2015-08-01: 1000 mL via INTRAVENOUS

## 2015-08-01 NOTE — Discharge Instructions (Signed)
Stay hydrated.   Take zofran for nausea.   Take cipro and flagyl as prescribed.   See GI next week.   Return to ER if you have worse abdominal pain, vomiting, fever, dehydration, uncontrolled diarrhea.

## 2015-08-01 NOTE — ED Provider Notes (Signed)
CSN: [id number]     Arrival date & time 08/01/15  1608 History   First MD Initiated Contact with Patient 08/01/15 2022     Chief Complaint  Patient presents with  . Diarrhea     (Consider location/radiation/quality/duration/timing/severity/associated sxs/prior Treatment) The history is provided by the patient.  Tommy Peterson is a 74 y.o. male hx of DM, HL, HTN, here with diarrhea, vomiting. His been having uncontrollable diarrhea for the last 14 months. Patient states that he sometimes couldn't go to the bathroom in time and has a bowel movement in his pants. Patient had a endoscopy done at [facility] about 14 months ago that was of remarkable. Patient has been taking entocort with no relief. Patient hasn't followed up with digestive health in [place] and has not improved. For the last 2-3 days, patient has been having some vomiting. Patient has worsening diarrhea as well. Denies any fevers or chills. Denies any melena or blood in his stool. He is scheduled to see Dr. Oletta Lamas from GI next week.   Past Medical History  Diagnosis Date  . Diabetes mellitus without complication ([place])   . Cataract   . Dyslipidemia   . Hypertension   . Basal cell cancer     forehead and right arm   Past Surgical History  Procedure Laterality Date  . Hernia repair      right inguinal  . Prostate surgery      BPH  . Cataract extraction      bilateral  . Tonsillectomy     Family History  Problem Relation Age of Onset  . Diabetes Mother   . Heart failure Mother    Social History  Substance Use Topics  . Smoking status: Former Smoker    Types: Cigarettes  . Smokeless tobacco: Never Used     Comment: 09/03/1996  . Alcohol Use: No    Review of Systems  Gastrointestinal: Positive for vomiting and diarrhea.  All other systems reviewed and are negative.     Allergies  Bee venom and Metformin and related  Home Medications   Prior to Admission medications   Medication Sig Start  Date End Date Taking? Authorizing Provider  aspirin 81 MG tablet Take 81 mg by mouth daily.   Yes Historical Provider, MD  budesonide (ENTOCORT EC) 3 MG 24 hr capsule Take 6 mg by mouth daily as needed (for diarrhea).   Yes Historical Provider, MD  citalopram (CELEXA) 40 MG tablet Take 20 mg by mouth daily.   Yes Historical Provider, MD  ibuprofen (ADVIL,MOTRIN) 800 MG tablet Take 1 tablet (800 mg total) by mouth 3 (three) times daily. 12/21/13  Yes Ezequiel Essex, MD  LANTUS SOLOSTAR 100 UNIT/ML Solostar Pen Inject 16 Units into the skin at bedtime.  01/05/14  Yes Historical Provider, MD  losartan (COZAAR) 100 MG tablet Take 100 mg by mouth daily.   Yes Historical Provider, MD  simvastatin (ZOCOR) 40 MG tablet Take 40 mg by mouth every evening.   Yes Historical Provider, MD   BP 127/57 mmHg  Pulse 82  Temp(Src) 98 F (36.7 C) (Oral)  Resp 18  SpO2 95% Physical Exam  Constitutional: He is oriented to person, place, and time.  Chronically ill, dehydrated   HENT:  Head: Normocephalic.  MM slightly dry   Eyes: Conjunctivae are normal. Pupils are equal, round, and reactive to light.  Neck: Normal range of motion. Neck supple.  Cardiovascular: Normal rate, regular rhythm and normal heart sounds.   Pulmonary/Chest: Effort normal  and breath sounds normal. No respiratory distress. He has no wheezes. He has no rales.  Abdominal:  Mild LLQ tenderness, no rebound   Musculoskeletal: Normal range of motion. He exhibits no edema or tenderness.  Neurological: He is alert and oriented to person, place, and time. No cranial nerve deficit. Coordination normal.  Skin: Skin is warm and dry.  Psychiatric: He has a normal mood and affect. His behavior is normal. Judgment and thought content normal.  Nursing note and vitals reviewed.   ED Course  Procedures (including critical care time) Labs Review Labs Reviewed  COMPREHENSIVE METABOLIC PANEL - Abnormal; Notable for the following:    Potassium 3.4 (*)     Glucose, Bld 206 (*)    Calcium 8.6 (*)    Total Protein 6.2 (*)    Albumin 3.2 (*)    ALT 11 (*)    All other components within normal limits  CBC - Abnormal; Notable for the following:    WBC 19.7 (*)    RBC 2.72 (*)    Hemoglobin 10.0 (*)    HCT 30.5 (*)    MCV 112.1 (*)    MCH 36.8 (*)    RDW 16.1 (*)    All other components within normal limits  URINALYSIS, ROUTINE W REFLEX MICROSCOPIC (NOT AT [facility]) - Abnormal; Notable for the following:    Color, Urine AMBER (*)    APPearance CLOUDY (*)    Glucose, UA 250 (*)    Bilirubin Urine SMALL (*)    Ketones, ur 15 (*)    Protein, ur 30 (*)    Leukocytes, UA TRACE (*)    All other components within normal limits  URINE MICROSCOPIC-ADD ON - Abnormal; Notable for the following:    Squamous Epithelial / LPF 0-5 (*)    Bacteria, UA FEW (*)    Crystals CA OXALATE CRYSTALS (*)    All other components within normal limits  C DIFFICILE QUICK SCREEN W PCR REFLEX  LIPASE, BLOOD    Imaging Review Ct Abdomen Pelvis W Contrast  08/01/2015  CLINICAL DATA:  Diarrhea for 14 months. Nausea and vomiting today. Leukocytosis. History of hernia repair, diabetes, hypertension, dyslipidemia. EXAM: CT ABDOMEN AND PELVIS WITH CONTRAST TECHNIQUE: Multidetector CT imaging of the abdomen and pelvis was performed using the standard protocol following bolus administration of intravenous contrast. CONTRAST:  163mL OMNIPAQUE IOHEXOL 300 MG/ML  SOLN COMPARISON:  None. FINDINGS: LUNG BASES: Atelectasis/scarring in the LEFT greater than RIGHT lower lobes, lingula. No pleural effusion or focal consolidation. Heart size is normal. No pericardial effusions. Mild coronary artery calcifications. Mildly thickened distal esophagus. SOLID ORGANS: The liver demonstrates subcentimeter probable cyst in dome of the liver, otherwise unremarkable. Spleen, gallbladder, pancreas and adrenal glands are unremarkable. GASTROINTESTINAL TRACT: Air-fluid levels throughout the colon to  the level the rectum. Mild circumferential descending, sigmoid and rectal wall thickening with minimal pericolonic inflammation. The stomach, small bowel are normal in course and caliber without inflammatory changes though sensitivity decreased by lack of enteric contrast. Normal appendix. KIDNEYS/ URINARY TRACT: Kidneys are orthotopic, demonstrating symmetric enhancement. No nephrolithiasis, hydronephrosis or solid renal masses. Too small to characterize hypodensities in the kidneys bilaterally. The unopacified ureters are normal in course and caliber. Delayed imaging through the kidneys demonstrates symmetric prompt contrast excretion within the proximal urinary collecting system. Urinary bladder is partially distended and unremarkable. PERITONEUM/RETROPERITONEUM: Aortoiliac vessels are normal in course and caliber, moderate calcific atherosclerosis. No lymphadenopathy by CT size criteria. Subcentimeter scattered lymph nodes in the  pelvis are likely reactive. Prostate is enlarged, invading the base of the bladder. No intraperitoneal free fluid nor free air. SOFT TISSUE/OSSEOUS STRUCTURES: Non-suspicious. Small fat containing inguinal hernias. IMPRESSION: Nonspecific mild colitis, with colonic air-fluid levels suggesting mild ileus. No bowel obstruction. Mildly thickened distal esophagus could represent redundancy, though nonspecific without superimposed inflammatory changes. Electronically Signed   By: Elon Alas M.D.   On: 08/01/2015 23:20   I have personally reviewed and evaluated these images and lab results as part of my medical decision-making.   EKG Interpretation None      MDM   Final diagnoses:  None    Tommy Peterson is a 74 y.o. male here with diarrhea that is chronic with vomiting several days. WBC 19.7. Concerned for possible C diff. Will get CT ab/pel. Will hydrate and reassess.   11:33 PM  CT showed colitis. Consider C diff. But patient didn't give a stool sample. Will give  cipro/flagyl. Has GI follow up in several days. No vomiting in the ED. Will give zofran for nausea.    Wandra Arthurs, MD 08/01/15 [phone]

## 2015-08-01 NOTE — ED Notes (Signed)
Pt reports to the ED for eval of N/V/D. Pt reports the N/V began today and he reports he has had uncontrollable diarrhea x 14 months. Denies any blood in his stool. He has had been seen at the digestive center several times but they do not have a solution. Pt reports he has an appointment with Dr. Oletta Lamas on Friday but he can no longer tolerate it. Pt A&Ox4, resp e/u, and skin warm and dry.

## 2015-08-02 DIAGNOSIS — K529 Noninfective gastroenteritis and colitis, unspecified: Secondary | ICD-10-CM | POA: Diagnosis not present

## 2015-09-09 ENCOUNTER — Ambulatory Visit (INDEPENDENT_AMBULATORY_CARE_PROVIDER_SITE_OTHER): Payer: Medicare Other | Admitting: Family Medicine

## 2015-09-09 VITALS — BP 130/60 | HR 100 | Temp 98.2°F | Resp 16 | Ht 69.5 in | Wt 162.8 lb

## 2015-09-09 DIAGNOSIS — J0101 Acute recurrent maxillary sinusitis: Secondary | ICD-10-CM

## 2015-09-09 DIAGNOSIS — Z794 Long term (current) use of insulin: Secondary | ICD-10-CM | POA: Diagnosis not present

## 2015-09-09 DIAGNOSIS — E119 Type 2 diabetes mellitus without complications: Secondary | ICD-10-CM

## 2015-09-09 DIAGNOSIS — R509 Fever, unspecified: Secondary | ICD-10-CM | POA: Diagnosis not present

## 2015-09-09 DIAGNOSIS — K52831 Collagenous colitis: Secondary | ICD-10-CM

## 2015-09-09 DIAGNOSIS — R05 Cough: Secondary | ICD-10-CM | POA: Diagnosis not present

## 2015-09-09 DIAGNOSIS — R059 Cough, unspecified: Secondary | ICD-10-CM

## 2015-09-09 LAB — POCT INFLUENZA A/B
Influenza A, POC: NEGATIVE
Influenza B, POC: NEGATIVE

## 2015-09-09 LAB — POCT CBC
Granulocyte percent: 47.4 %G (ref 37–80)
HCT, POC: 32.1 % — AB (ref 43.5–53.7)
Hemoglobin: 10.9 g/dL — AB (ref 14.1–18.1)
Lymph, poc: 5.8 — AB (ref 0.6–3.4)
MCH: 37.2 pg — AB (ref 27–31.2)
MCHC: 33.8 g/dL (ref 31.8–35.4)
MCV: 110.1 fL — AB (ref 80–97)
MID (CBC): 1.2 — AB (ref 0–0.9)
MPV: 6.5 fL (ref 0–99.8)
POC Granulocyte: 6.3 (ref 2–6.9)
POC LYMPH PERCENT: 43.9 %L (ref 10–50)
POC MID %: 8.7 % (ref 0–12)
Platelet Count, POC: 407 10*3/uL (ref 142–424)
RBC: 2.92 M/uL — AB (ref 4.69–6.13)
RDW, POC: 19 %
WBC: 13.3 10*3/uL — AB (ref 4.6–10.2)

## 2015-09-09 MED ORDER — BENZONATATE 100 MG PO CAPS: 100.0000 mg | ORAL_CAPSULE | Freq: Three times a day (TID) | ORAL | Status: DC | PRN

## 2015-09-09 MED ORDER — AMOXICILLIN-POT CLAVULANATE 875-125 MG PO TABS: 1.0000 | ORAL_TABLET | Freq: Two times a day (BID) | ORAL | Status: DC

## 2015-09-09 NOTE — Progress Notes (Signed)
Subjective:    Patient ID: Tommy Peterson, male    DOB: 1940/12/07, 75 y.o.   MRN: [id number]  09/09/2015  Sinusitis and Cough   HPI This 75 y.o. male presents for evaluation of congestion, cough.  Onset four days ago.  +fever 100.4; last fever yesterday.  +sweats night; no chills.  +frontal pressure; no major HA.  +ST initially and then resolved.  No ear pain.  +rhinorrhea clear; +nasal congestion; excessive drainage.  +PND.  +sinus pressure.  +coughing horrible; ribs are very sore.  Cough from PND.  Feels like there is sputum but unable to get up.  No SOB.  No wheezing.  No asthma or COPD.  No tobacco; no history of pneumonia.    No flu vaccine this season this year.  Horrible body aches.  Taking Dayquil, Nyquil, Ibuprofen.  Girlfriend with similar symptoms.  Diabetes:  118 this morning.  90s-170s.  Diarrhea for 14 months; after last colonoscopy had diarrhea for 14 months; ED evaluation on 08/01/15.  Treatment now much improved.   Takes Entecort for chronic diarrhea; GI is [facility].  +colitis but not Crohns or Ulcerative Colitis.    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  PCP: [facility] in [place]  Review of Systems  Constitutional: Positive for fever and diaphoresis. Negative for chills, activity change, appetite change and fatigue.  HENT: Positive for congestion, postnasal drip, rhinorrhea and sinus pressure. Negative for ear discharge, ear pain, nosebleeds, sore throat and trouble swallowing.   Respiratory: Positive for cough. Negative for shortness of breath and wheezing.   Cardiovascular: Negative for chest pain, palpitations and leg swelling.  Gastrointestinal: Negative for nausea, vomiting, abdominal pain and diarrhea.  Endocrine: Negative for cold intolerance, heat intolerance, polydipsia, polyphagia and polyuria.  Skin: Negative for color change, rash and wound.  Neurological: Negative for dizziness, tremors, seizures,  syncope, facial asymmetry, speech difficulty, weakness, light-headedness, numbness and headaches.  Psychiatric/Behavioral: Negative for sleep disturbance and dysphoric mood. The patient is not nervous/anxious.     Past Medical History  Diagnosis Date  . Diabetes mellitus without complication ([place])   . Cataract   . Dyslipidemia   . Hypertension   . Basal cell cancer     forehead and right arm   Past Surgical History  Procedure Laterality Date  . Hernia repair      right inguinal  . Prostate surgery      BPH  . Cataract extraction      bilateral  . Tonsillectomy     Allergies  Allergen Reactions  . Bee Venom Swelling  . Metformin And Related Diarrhea   Current Outpatient Prescriptions  Medication Sig Dispense Refill  . aspirin 81 MG tablet Take 81 mg by mouth daily.    . budesonide (ENTOCORT EC) 3 MG 24 hr capsule Take 6 mg by mouth daily as needed (for diarrhea).    . ibuprofen (ADVIL,MOTRIN) 800 MG tablet Take 1 tablet (800 mg total) by mouth 3 (three) times daily. 21 tablet 0  . LANTUS SOLOSTAR 100 UNIT/ML Solostar Pen Inject 16 Units into the skin at bedtime.     Marland Kitchen amoxicillin-clavulanate (AUGMENTIN) 875-125 MG tablet Take 1 tablet by mouth 2 (two) times daily. 20 tablet 0  . benzonatate (TESSALON) 100 MG capsule Take 1-2 capsules (100-200 mg total) by mouth 3 (three) times daily as needed for cough. 45 capsule 0   No current facility-administered medications for this visit.   Social History   Social  History  . Marital Status: Married    Spouse Name: N/A  . Number of Children: 8  . Years of Education: college-2   Occupational History  . Sales   . [employer]    Social History Main Topics  . Smoking status: Former Smoker    Types: Cigarettes  . Smokeless tobacco: Never Used     Comment: 09/03/1996  . Alcohol Use: No  . Drug Use: No  . Sexual Activity: No   Other Topics Concern  . Not on file   Social History Narrative   Family History    Problem Relation Age of Onset  . Diabetes Mother   . Heart failure Mother        Objective:    BP 130/60 mmHg  Pulse 100  Temp(Src) 98.2 F (36.8 C) (Oral)  Resp 16  Ht 5' 9.5" (1.765 m)  Wt 162 lb 12.8 oz (73.846 kg)  BMI 23.70 kg/m2  SpO2 98% Physical Exam  Constitutional: He is oriented to person, place, and time. He appears well-developed and well-nourished. No distress.  HENT:  Head: Normocephalic and atraumatic.  Right Ear: External ear and ear canal normal. Tympanic membrane is retracted. Tympanic membrane is not injected, not perforated, not erythematous and not bulging.  Left Ear: External ear and ear canal normal. Tympanic membrane is retracted. Tympanic membrane is not injected, not perforated and not bulging.  Nose: Mucosal edema and rhinorrhea present. Right sinus exhibits no maxillary sinus tenderness and no frontal sinus tenderness. Left sinus exhibits no maxillary sinus tenderness and no frontal sinus tenderness.  Mouth/Throat: Uvula is midline, oropharynx is clear and moist and mucous membranes are normal.  Eyes: Conjunctivae and EOM are normal. Pupils are equal, round, and reactive to light.  Neck: Normal range of motion. Neck supple. Carotid bruit is not present. No thyromegaly present.  Cardiovascular: Normal rate, regular rhythm, normal heart sounds and intact distal pulses.  Exam reveals no gallop and no friction rub.   No murmur heard. Pulmonary/Chest: Effort normal and breath sounds normal. He has no wheezes. He has no rales.  Abdominal: Soft. Bowel sounds are normal. He exhibits no distension and no mass. There is no tenderness. There is no rebound and no guarding.  Lymphadenopathy:    He has no cervical adenopathy.  Neurological: He is alert and oriented to person, place, and time. No cranial nerve deficit.  Skin: Skin is warm and dry. No rash noted. He is not diaphoretic.  Psychiatric: He has a normal mood and affect. His behavior is normal.  Nursing  note and vitals reviewed.  Results for orders placed or performed in visit on 09/09/15  POCT CBC  Result Value Ref Range   WBC 13.3 (A) 4.6 - 10.2 K/uL   Lymph, poc 5.8 (A) 0.6 - 3.4   POC LYMPH PERCENT 43.9 10 - 50 %L   MID (cbc) 1.2 (A) 0 - 0.9   POC MID % 8.7 0 - 12 %M   POC Granulocyte 6.3 2 - 6.9   Granulocyte percent 47.4 37 - 80 %G   RBC 2.92 (A) 4.69 - 6.13 M/uL   Hemoglobin 10.9 (A) 14.1 - 18.1 g/dL   HCT, POC 32.1 (A) 43.5 - 53.7 %   MCV 110.1 (A) 80 - 97 fL   MCH, POC 37.2 (A) 27 - 31.2 pg   MCHC 33.8 31.8 - 35.4 g/dL   RDW, POC 19.0 %   Platelet Count, POC 407 142 - 424 K/uL  MPV 6.5 0 - 99.8 fL  POCT Influenza A/B  Result Value Ref Range   Influenza A, POC Negative Negative   Influenza B, POC Negative Negative       Assessment & Plan:   1. Fever and chills   2. Cough   3. Type 2 diabetes mellitus without complication, with long-term current use of insulin ([place])   4. Colitis, collagenous    -New. -Rx for Augmentin and Tessalon Perles provided. -Recommend Flonase and Mucinex. -monitor sugars closely with acute infection.   Orders Placed This Encounter  Procedures  . POCT CBC  . POCT Influenza A/B   Meds ordered this encounter  Medications  . amoxicillin-clavulanate (AUGMENTIN) 875-125 MG tablet    Sig: Take 1 tablet by mouth 2 (two) times daily.    Dispense:  20 tablet    Refill:  0  . benzonatate (TESSALON) 100 MG capsule    Sig: Take 1-2 capsules (100-200 mg total) by mouth 3 (three) times daily as needed for cough.    Dispense:  45 capsule    Refill:  0    No Follow-up on file.    Kristi Elayne Guerin, M.D. [facility] [address] [place], [place]  [address] [phone] phone [phone] fax

## 2015-09-09 NOTE — Patient Instructions (Signed)
1.  Restart Flonase nasal spray once daily. 2.  Restart Mucinex 1 tablet twice daily.   Sinusitis, Adult Sinusitis is redness, soreness, and inflammation of the paranasal sinuses. Paranasal sinuses are air pockets within the bones of your face. They are located beneath your eyes, in the middle of your forehead, and above your eyes. In healthy paranasal sinuses, mucus is able to drain out, and air is able to circulate through them by way of your nose. However, when your paranasal sinuses are inflamed, mucus and air can become trapped. This can allow bacteria and other germs to grow and cause infection. Sinusitis can develop quickly and last only a short time (acute) or continue over a long period (chronic). Sinusitis that lasts for more than 12 weeks is considered chronic. CAUSES Causes of sinusitis include:  Allergies.  Structural abnormalities, such as displacement of the cartilage that separates your nostrils (deviated septum), which can decrease the air flow through your nose and sinuses and affect sinus drainage.  Functional abnormalities, such as when the small hairs (cilia) that line your sinuses and help remove mucus do not work properly or are not present. SIGNS AND SYMPTOMS Symptoms of acute and chronic sinusitis are the same. The primary symptoms are pain and pressure around the affected sinuses. Other symptoms include:  Upper toothache.  Earache.  Headache.  Bad breath.  Decreased sense of smell and taste.  A cough, which worsens when you are lying flat.  Fatigue.  Fever.  Thick drainage from your nose, which often is green and may contain pus (purulent).  Swelling and warmth over the affected sinuses. DIAGNOSIS Your health care provider will perform a physical exam. During your exam, your health care provider may perform any of the following to help determine if you have acute sinusitis or chronic sinusitis:  Look in your nose for signs of abnormal growths in your  nostrils (nasal polyps).  Tap over the affected sinus to check for signs of infection.  View the inside of your sinuses using an imaging device that has a light attached (endoscope). If your health care provider suspects that you have chronic sinusitis, one or more of the following tests may be recommended:  Allergy tests.  Nasal culture. A sample of mucus is taken from your nose, sent to a lab, and screened for bacteria.  Nasal cytology. A sample of mucus is taken from your nose and examined by your health care provider to determine if your sinusitis is related to an allergy. TREATMENT Most cases of acute sinusitis are related to a viral infection and will resolve on their own within 10 days. Sometimes, medicines are prescribed to help relieve symptoms of both acute and chronic sinusitis. These may include pain medicines, decongestants, nasal steroid sprays, or saline sprays. However, for sinusitis related to a bacterial infection, your health care provider will prescribe antibiotic medicines. These are medicines that will help kill the bacteria causing the infection. Rarely, sinusitis is caused by a fungal infection. In these cases, your health care provider will prescribe antifungal medicine. For some cases of chronic sinusitis, surgery is needed. Generally, these are cases in which sinusitis recurs more than 3 times per year, despite other treatments. HOME CARE INSTRUCTIONS  Drink plenty of water. Water helps thin the mucus so your sinuses can drain more easily.  Use a humidifier.  Inhale steam 3-4 times a day (for example, sit in the bathroom with the shower running).  Apply a warm, moist washcloth to your face 3-4  times a day, or as directed by your health care provider.  Use saline nasal sprays to help moisten and clean your sinuses.  Take medicines only as directed by your health care provider.  If you were prescribed either an antibiotic or antifungal medicine, finish it all  even if you start to feel better. SEEK IMMEDIATE MEDICAL CARE IF:  You have increasing pain or severe headaches.  You have nausea, vomiting, or drowsiness.  You have swelling around your face.  You have vision problems.  You have a stiff neck.  You have difficulty breathing.   This information is not intended to replace advice given to you by your health care provider. Make sure you discuss any questions you have with your health care provider.   Document Released: 07/24/2005 Document Revised: 08/14/2014 Document Reviewed: 08/08/2011 Elsevier Interactive Patient Education [employer].

## 2015-09-20 ENCOUNTER — Ambulatory Visit (INDEPENDENT_AMBULATORY_CARE_PROVIDER_SITE_OTHER): Payer: Medicare Other | Admitting: Physician Assistant

## 2015-09-20 VITALS — BP 122/60 | HR 93 | Temp 97.8°F | Resp 18 | Ht 69.5 in | Wt 161.0 lb

## 2015-09-20 DIAGNOSIS — I10 Essential (primary) hypertension: Secondary | ICD-10-CM | POA: Insufficient documentation

## 2015-09-20 DIAGNOSIS — R197 Diarrhea, unspecified: Secondary | ICD-10-CM

## 2015-09-20 DIAGNOSIS — E785 Hyperlipidemia, unspecified: Secondary | ICD-10-CM | POA: Insufficient documentation

## 2015-09-20 DIAGNOSIS — R6883 Chills (without fever): Secondary | ICD-10-CM

## 2015-09-20 DIAGNOSIS — N400 Enlarged prostate without lower urinary tract symptoms: Secondary | ICD-10-CM | POA: Insufficient documentation

## 2015-09-20 DIAGNOSIS — K52831 Collagenous colitis: Secondary | ICD-10-CM | POA: Diagnosis not present

## 2015-09-20 DIAGNOSIS — M1990 Unspecified osteoarthritis, unspecified site: Secondary | ICD-10-CM | POA: Insufficient documentation

## 2015-09-20 LAB — POCT CBC
Granulocyte percent: 51.6 %G (ref 37–80)
HCT, POC: 31.2 % — AB (ref 43.5–53.7)
HEMOGLOBIN: 10.7 g/dL — AB (ref 14.1–18.1)
LYMPH, POC: 5.1 — AB (ref 0.6–3.4)
MCH, POC: 37.9 pg — AB (ref 27–31.2)
MCHC: 34.4 g/dL (ref 31.8–35.4)
MCV: 110.1 fL — AB (ref 80–97)
MID (cbc): 1.2 — AB (ref 0–0.9)
MPV: 6.5 fL (ref 0–99.8)
POC Granulocyte: 6.7 (ref 2–6.9)
POC LYMPH %: 39.3 % (ref 10–50)
POC MID %: 9.1 % (ref 0–12)
Platelet Count, POC: 413 10*3/uL (ref 142–424)
RBC: 2.84 M/uL — AB (ref 4.69–6.13)
RDW, POC: 18.7 %
WBC: 12.9 10*3/uL — AB (ref 4.6–10.2)

## 2015-09-20 MED ORDER — LOPERAMIDE HCL 2 MG PO CAPS: 2.0000 mg | ORAL_CAPSULE | Freq: Three times a day (TID) | ORAL | Status: DC | PRN

## 2015-09-20 MED ORDER — CIPROFLOXACIN HCL 500 MG PO TABS: 500.0000 mg | ORAL_TABLET | Freq: Two times a day (BID) | ORAL | Status: AC

## 2015-09-20 MED ORDER — CHOLESTYRAMINE 4 G PO PACK: 4.0000 g | PACK | Freq: Four times a day (QID) | ORAL | Status: DC

## 2015-09-20 MED ORDER — METRONIDAZOLE 500 MG PO TABS: 500.0000 mg | ORAL_TABLET | Freq: Three times a day (TID) | ORAL | Status: AC

## 2015-09-20 NOTE — Patient Instructions (Signed)
I spoke with Amber at [facility] and your March appointment has been MOVED to 09/28/2015 at 10:45 am. You are also on the cancellation list, so you may be able to be seen sooner.

## 2015-09-20 NOTE — Progress Notes (Signed)
Patient ID: Tommy Peterson, male    DOB: 01-17-1941, 75 y.o.   MRN: [id number]  PCP: Janett Billow, MD  Subjective:   Chief Complaint  Patient presents with  . Diarrhea    hx of diarrhea    HPI Presents for evaluation of diarrhea.  He reports diarrhea, due to collagenous colitis x 7 years. Intermittently, he has frequent watery diarrhea, and he's tired of it. "This is no way to live."   He's been on Enterocort EC for some time, but feels it's losing it's effectiveness.  He was seen in the ED on 08/01/15 with diarrhea and was treated with Cipro and Flagyl for possible C diff, as he had an elevated WBC. He then saw his GI specialist, and was tested, and found negative for C diff.  Guaiac cards negative x 6. He reports that his symptoms resolved with treatment until 09/14/2015, when diarrhea recurred. He's having multiple loose, watery stools, stool urgency and incontinence. Last night he had 4-5 episodes in 3 hours.  His next appointment with GI is next month. He has not called to see if he can be seen sooner.    Review of Systems  Constitutional: Positive for chills and diaphoresis.  Respiratory: Negative for shortness of breath.   Cardiovascular: Negative for chest pain.  Gastrointestinal: Positive for diarrhea. Negative for nausea and vomiting.  Genitourinary: Negative for dysuria, urgency and frequency.  Neurological: Negative for dizziness.       Patient Active Problem List   Diagnosis Date Noted  . Colitis, collagenous 09/09/2015  . Disturbance of skin sensation 01/09/2014  . Diabetes ([place]) 06/24/2013     Prior to Admission medications   Medication Sig Start Date End Date Taking? Authorizing Provider  aspirin 81 MG tablet Take 81 mg by mouth daily.   Yes Historical Provider, MD  budesonide (ENTOCORT EC) 3 MG 24 hr capsule Take 6 mg by mouth daily as needed (for diarrhea).   Yes Historical Provider, MD  ibuprofen (ADVIL,MOTRIN) 800 MG tablet Take 1 tablet (800 mg  total) by mouth 3 (three) times daily. 12/21/13  Yes Ezequiel Essex, MD  LANTUS SOLOSTAR 100 UNIT/ML Solostar Pen Inject 16 Units into the skin at bedtime.  01/05/14  Yes Historical Provider, MD  Multiple Vitamin (MULTIVITAMIN) capsule Take by mouth.    Historical Provider, MD     Allergies  Allergen Reactions  . Bee Venom Swelling  . Metformin And Related Diarrhea       Objective:  Physical Exam  Constitutional: He is oriented to person, place, and time. He appears well-developed and well-nourished. He is active and cooperative. No distress.  BP 122/60 mmHg  Pulse 93  Temp(Src) 97.8 F (36.6 C)  Resp 18  Ht 5' 9.5" (1.765 m)  Wt 161 lb (73.029 kg)  BMI 23.44 kg/m2  SpO2 98%  HENT:  Head: Normocephalic and atraumatic.  Right Ear: Hearing normal.  Left Ear: Hearing normal.  Eyes: Conjunctivae are normal. No scleral icterus.  Neck: Normal range of motion and phonation normal. Neck supple. No thyromegaly present.  Cardiovascular: Normal rate, regular rhythm and normal heart sounds.   Pulses:      Radial pulses are 2+ on the right side, and 2+ on the left side.  Pulmonary/Chest: Effort normal and breath sounds normal.  Abdominal: Normal appearance and bowel sounds are normal. There is no tenderness.  Lymphadenopathy:       Head (right side): No tonsillar, no preauricular, no posterior auricular and no occipital  adenopathy present.       Head (left side): No tonsillar, no preauricular, no posterior auricular and no occipital adenopathy present.    He has no cervical adenopathy.       Right: No supraclavicular adenopathy present.       Left: No supraclavicular adenopathy present.  Neurological: He is alert and oriented to person, place, and time. No sensory deficit.  Skin: Skin is warm, dry and intact. No rash noted. No cyanosis or erythema. Nails show no clubbing.  Psychiatric: His speech is normal and behavior is normal. His mood appears not anxious. His affect is angry. His  affect is not blunt, not labile and not inappropriate. He does not exhibit a depressed mood.   Results for orders placed or performed in visit on 09/20/15  POCT CBC  Result Value Ref Range   WBC 12.9 (A) 4.6 - 10.2 K/uL   Lymph, poc 5.1 (A) 0.6 - 3.4   POC LYMPH PERCENT 39.3 10 - 50 %L   MID (cbc) 1.2 (A) 0 - 0.9   POC MID % 9.1 0 - 12 %M   POC Granulocyte 6.7 2 - 6.9   Granulocyte percent 51.6 37 - 80 %G   RBC 2.84 (A) 4.69 - 6.13 M/uL   Hemoglobin 10.7 (A) 14.1 - 18.1 g/dL   HCT, POC 31.2 (A) 43.5 - 53.7 %   MCV 110.1 (A) 80 - 97 fL   MCH, POC 37.9 (A) 27 - 31.2 pg   MCHC 34.4 31.8 - 35.4 g/dL   RDW, POC 18.7 %   Platelet Count, POC 413 142 - 424 K/uL   MPV 6.5 0 - 99.8 fL           Assessment & Plan:   1. Colitis, collagenous He says that these medications are like "candy" and do not work. He already has them at home. I was able to move his appointment with GI up to 2/21, but he remains unsatisfied with the timing. He indicates that he will call or go there today to try to be seen more urgently. Encouraged to ask about the next step in treatment of this condition. - loperamide (IMODIUM) 2 MG capsule; Take 1 capsule (2 mg total) by mouth 3 (three) times daily as needed for diarrhea or loose stools.  Dispense: 30 capsule; Refill: 0 - cholestyramine (QUESTRAN) 4 g packet; Take 1 packet (4 g total) by mouth 4 (four) times daily.  Dispense: 60 each; Refill: 0  2. Chills CBC is stable. I do not believe this is infectious. - POCT CBC  3. Diarrhea, unspecified type Despite negative C diff testing after previous treatment, and with stable CBC, it is possible that he has a subacute C diff infection, partially treated. He is insistent that the treatment helped in 07/2015 and that he needs it again. He is frustrated that all I have to offer is "stop-gap" options, as he wants something to cure this. - ciprofloxacin (CIPRO) 500 MG tablet; Take 1 tablet (500 mg total) by mouth 2 (two)  times daily.  Dispense: 14 tablet; Refill: 0 - metroNIDAZOLE (FLAGYL) 500 MG tablet; Take 1 tablet (500 mg total) by mouth 3 (three) times daily.  Dispense: 21 tablet; Refill: 0  Fara Chute, PA-C Certified Physician Assistant [facility]

## 2016-02-14 ENCOUNTER — Ambulatory Visit (INDEPENDENT_AMBULATORY_CARE_PROVIDER_SITE_OTHER): Payer: Medicare Other | Admitting: Urgent Care

## 2016-02-14 VITALS — BP 136/54 | HR 85 | Temp 98.0°F | Resp 18 | Ht 69.29 in | Wt 166.0 lb

## 2016-02-14 DIAGNOSIS — R9431 Abnormal electrocardiogram [ECG] [EKG]: Secondary | ICD-10-CM | POA: Diagnosis not present

## 2016-02-14 DIAGNOSIS — L259 Unspecified contact dermatitis, unspecified cause: Secondary | ICD-10-CM

## 2016-02-14 DIAGNOSIS — T63441A Toxic effect of venom of bees, accidental (unintentional), initial encounter: Secondary | ICD-10-CM

## 2016-02-14 MED ORDER — METHYLPREDNISOLONE SODIUM SUCC 125 MG IJ SOLR
125.0000 mg | Freq: Once | INTRAMUSCULAR | Status: AC
  Administered 2016-02-14: 125 mg via INTRAMUSCULAR

## 2016-02-14 MED ORDER — TRIAMCINOLONE ACETONIDE 0.1 % EX CREA: 1.0000 "application " | TOPICAL_CREAM | Freq: Two times a day (BID) | CUTANEOUS | Status: DC

## 2016-02-14 NOTE — Progress Notes (Signed)
    MRN: [id number] DOB: 09/25/1940  Subjective:   Tommy Peterson is a 75 y.o. male presenting for chief complaint of Insect Bite  Reports suffering 4 bee stings ~1 hour ago. Had one to his neck/upper left chest, right cheek, left hand, left torso. Patient was hyperventilating, felt pale but was able to use an epi-pen to his left thigh. Currently, he denies fever, chest pain, chest tightness, shob, heart racing, n/v, abdominal pain, facial swelling, throat closing, tongue swelling. He does admit that there is stinging sensation over sting sites. He has type 2 diabetes and manages blood sugar well with insulin. His blood sugar ranges from 60's-120's fasting.  Tommy Peterson has a current medication list which includes the following prescription(s): aspirin, budesonide, ibuprofen, lantus solostar, multivitamin, cholestyramine, and loperamide. Also is allergic to bee venom and metformin and related.  Tommy Peterson  has a past medical history of Diabetes mellitus without complication ([place]); Cataract; Dyslipidemia; Hypertension; and Basal cell cancer. Also  has past surgical history that includes Hernia repair; Prostate surgery; Cataract extraction; and Tonsillectomy.  Objective:   Vitals: BP 136/54 mmHg  Pulse 85  Temp(Src) 98 F (36.7 C)  Resp 18  Ht 5' 9.29" (1.76 m)  Wt 166 lb (75.297 kg)  BMI 24.31 kg/m2  SpO2 97%  Physical Exam  Constitutional: He is oriented to person, place, and time. He appears well-developed and well-nourished.  HENT:  Mouth/Throat: Oropharynx is clear and moist.  Eyes: Conjunctivae are normal. Pupils are equal, round, and reactive to light. No scleral icterus.  Neck: Normal range of motion. Neck supple.  Cardiovascular: Normal rate, regular rhythm and intact distal pulses.  Exam reveals no gallop and no friction rub.   No murmur heard. Pulmonary/Chest: No respiratory distress. He has no wheezes. He has no rales.  Abdominal: Soft. Bowel sounds are normal. He exhibits no  distension and no mass. There is no tenderness.  Musculoskeletal: He exhibits no edema.  Lymphadenopathy:    He has no cervical adenopathy.  Neurological: He is alert and oriented to person, place, and time.  Skin: Skin is warm and dry.   ECG interpretation - Q-wave in Lead III, widened QRS in V2 as compared to ECG from 12/22/2013.  Assessment and Plan :   1. Bee sting, accidental or unintentional, initial encounter 2. Bee sting reaction, accidental or unintentional, initial encounter 3. Contact dermatitis - Solumedrol injection today. Use triamcinolone for local contact dermatitis. Benadryl as needed for itching. Counseled on use of epi pens including risks and adverse effects.   4. Nonspecific abnormal electrocardiogram (ECG) (EKG) - Counseled on this, he will check back with PCP for f/u and further work up.  Tommy Eagles, PA-C [facility] [phone] 02/14/2016 12:26 PM

## 2016-02-14 NOTE — Patient Instructions (Addendum)
Bee, Wasp, or Hornet Sting °Bees, wasps, and hornets are part of a family of insects that can sting people. These stings can cause pain and inflammation, but they are usually not serious. However, some people may have an allergic reaction to a sting. This can cause the symptoms to be more severe.  °SYMPTOMS  °Common symptoms of this condition include:  °· A red lump in the skin that sometimes has a tiny hole in the center. In some cases, a stinger may be in the center of the wound. °· Pain and itching at the sting site. °· Redness and swelling around the sting site. If you have an allergic reaction (localized allergic reaction), the swelling and redness may spread out from the sting site. In some cases, this reaction can continue to develop over the next 12-36 hours. °In rare cases, a person may have a severe allergic reaction (anaphylactic reaction) to a sting. Symptoms of an anaphylactic reaction may include:  °· Wheezing or difficulty breathing. °· Raised, itchy, red patches on the skin. °· Nausea or vomiting. °· Abdominal cramping. °· Diarrhea. °· Chest pain. °· Fainting. °· Redness of the face (flushing). °DIAGNOSIS  °This condition is usually diagnosed based on symptoms, medical history, and a physical exam. °TREATMENT  °Most stings can be treated with:  °· Icing to reduce swelling. °· Medicines (antihistamines) to treat itching or an allergic reaction. °· Medicines to help reduce pain. These may be medicines that you take by mouth, or medicated creams or lotions that you apply to your skin. °If you were stung by a bee, the stinger and a small sac of poison may be in the wound. This may be removed by brushing across it with a flat card, such as a credit card. Another method is to pinch the area and pull it out. These methods can help reduce the severity of the body's reaction to the sting.  °HOME CARE INSTRUCTIONS  °· Wash the sting site daily with soap and water as told by your health care provider. °· Apply  or take over-the-counter and prescription medicines only as told by your health care provider. °· If directed, apply ice to the sting area. °¨  Put ice in a plastic bag. °¨  Place a towel between your skin and the bag. °¨  Leave the ice on for 20 minutes, 2-3 times per day. °· Do not scratch the sting area. °· To lessen pain, try using a paste that is made of water and baking soda. Rub the paste on the sting area and leave it on for 5 minutes. °· If you had a severe allergic reaction to a sting, you may need: °¨  To wear a medical bracelet or necklace that lists the allergy. °¨  To learn when and how to use an anaphylaxis kit or epinephrine injection. Your family members may also need to learn this. °¨  To carry an anaphylaxis kit with you at all times. °SEEK MEDICAL CARE IF:  °· Your symptoms do not get better in 2-3 days. °· You have redness, swelling, or pain that spreads beyond the area of the sting. °· You have a fever. °SEEK IMMEDIATE MEDICAL CARE IF:  °You have symptoms of a severe allergic reaction. These include:  °· Wheezing or difficulty breathing. °· Chest pain. °· Light-headedness or fainting. °· Itchy, raised, red patches on the skin. °· Nausea or vomiting. °· Abdominal cramping. °· Diarrhea. °  °This information is not intended to replace advice given to you by your health care provider.   Make sure you discuss any questions you have with your health care provider.   Document Released: 07/24/2005 Document Revised: 04/14/2015 Document Reviewed: 12/09/2014 Elsevier Interactive Patient Education 2016 [employer].    Epinephrine injection (Auto-injector) What is this medicine? EPINEPHRINE (ep i NEF rin) is used for the emergency treatment of severe allergic reactions. You should keep this medicine with you at all times. This medicine may be used for other purposes; ask your health care provider or pharmacist if you have questions. What should I tell my health care provider before I take this  medicine? They need to know if you have any of the following conditions: -diabetes -heart disease -high blood pressure -lung or breathing disease, like asthma -Parkinson's disease -thyroid disease -an unusual or allergic reaction to epinephrine, sulfites, other medicines, foods, dyes, or preservatives -pregnant or trying to get pregnant -breast-feeding How should I use this medicine? This medicine is for injection into the outer thigh. Your doctor or health care professional will instruct you on the proper use of the device during an emergency. Read all directions carefully and make sure you understand them. Do not use more often than directed. Talk to your pediatrician regarding the use of this medicine in children. Special care may be needed. This drug is commonly used in children. A special device is available for use in children. If you are giving this medicine to a young child, hold their leg firmly in place before and during the injection to prevent injury. Overdosage: If you think you have taken too much of this medicine contact a poison control center or emergency room at once. NOTE: This medicine is only for you. Do not share this medicine with others. What if I miss a dose? This does not apply. You should only use this medicine for an allergic reaction. What may interact with this medicine? This medicine is only used during an emergency. Significant drug interactions are not likely during emergency use. This list may not describe all possible interactions. Give your health care provider a list of all the medicines, herbs, non-prescription drugs, or dietary supplements you use. Also tell them if you smoke, drink alcohol, or use illegal drugs. Some items may interact with your medicine. What should I watch for while using this medicine? Keep this medicine ready for use in the case of a severe allergic reaction. Make sure that you have the phone number of your doctor or health care  professional and local hospital ready. Remember to check the expiration date of your medicine regularly. You may need to have additional units of this medicine with you at work, school, or other places. Talk to your doctor or health care professional about your need for extra units. Some emergencies may require an additional dose. Check with your doctor or a health care professional before using an extra dose. After use, go to the nearest hospital or call 911. Avoid physical activity. Make sure the treating health care professional knows you have received an injection of this medicine. You will receive additional instructions on what to do during and after use of this medicine before a medical emergency occurs. What side effects may I notice from receiving this medicine? Side effects that you should report to your doctor or health care professional as soon as possible: -allergic reactions like skin rash, itching or hives, swelling of the face, lips, or tongue -breathing problems -chest pain -fast, irregular heartbeat -pain, tingling, numbness in the hands or feet -pain, redness, or irritation at site  where injected -vomiting Side effects that usually do not require medical attention (report to your doctor or health care professional if they continue or are bothersome): -anxious -dizziness -dry mouth -headache -increased sweating -nausea -unusually weak or tired This list may not describe all possible side effects. Call your doctor for medical advice about side effects. You may report side effects to FDA at 1-800-FDA-1088. Where should I keep my medicine? Keep out of the reach of children. Store at room temperature between 15 and 30 degrees C (59 and 86 degrees F). Protect from light and heat. The solution should be clear in color. If the solution is discolored or contains particles it must be replaced. Throw away any unused medicine after the expiration date. Ask your doctor or pharmacist about  proper disposal of the injector if it is expired or has been used. Always replace your auto-injector before it expires. NOTE: This sheet is a summary. It may not cover all possible information. If you have questions about this medicine, talk to your doctor, pharmacist, or health care provider.    2016, Elsevier/Gold Standard. (2014-12-28 12:24:50)     IF you received an x-ray today, you will receive an invoice from [facility]. Please contact [facility] at [phone] with questions or concerns regarding your invoice.   IF you received labwork today, you will receive an invoice from [employer]. Please contact [facility] at [phone] with questions or concerns regarding your invoice.   Our billing staff will not be able to assist you with questions regarding bills from these companies.  You will be contacted with the lab results as soon as they are available. The fastest way to get your results is to activate your My Chart account. Instructions are located on the last page of this paperwork. If you have not heard from [place] regarding the results in 2 weeks, please contact this office.

## 2016-05-04 ENCOUNTER — Emergency Department (HOSPITAL_COMMUNITY): Payer: Medicare Other

## 2016-05-04 ENCOUNTER — Encounter (HOSPITAL_COMMUNITY): Payer: Self-pay

## 2016-05-04 ENCOUNTER — Observation Stay (HOSPITAL_COMMUNITY)
Admission: EM | Admit: 2016-05-04 | Discharge: 2016-05-06 | Disposition: A | Discharge status: Home / Self Care | Payer: Medicare Other | Attending: Internal Medicine | Admitting: Internal Medicine

## 2016-05-04 ENCOUNTER — Ambulatory Visit (INDEPENDENT_AMBULATORY_CARE_PROVIDER_SITE_OTHER): Payer: Medicare Other | Admitting: Physician Assistant

## 2016-05-04 VITALS — BP 140/64 | HR 105 | Resp 18

## 2016-05-04 DIAGNOSIS — D649 Anemia, unspecified: Secondary | ICD-10-CM | POA: Diagnosis present

## 2016-05-04 DIAGNOSIS — E119 Type 2 diabetes mellitus without complications: Secondary | ICD-10-CM | POA: Diagnosis not present

## 2016-05-04 DIAGNOSIS — Z87891 Personal history of nicotine dependence: Secondary | ICD-10-CM | POA: Diagnosis not present

## 2016-05-04 DIAGNOSIS — Z7982 Long term (current) use of aspirin: Secondary | ICD-10-CM | POA: Diagnosis not present

## 2016-05-04 DIAGNOSIS — R0789 Other chest pain: Secondary | ICD-10-CM

## 2016-05-04 DIAGNOSIS — Z794 Long term (current) use of insulin: Secondary | ICD-10-CM | POA: Diagnosis not present

## 2016-05-04 DIAGNOSIS — R079 Chest pain, unspecified: Principal | ICD-10-CM | POA: Diagnosis present

## 2016-05-04 LAB — CBC
HCT: 23 % — ABNORMAL LOW (ref 39.0–52.0)
HEMOGLOBIN: 7.2 g/dL — AB (ref 13.0–17.0)
MCH: 36.7 pg — AB (ref 26.0–34.0)
MCHC: 31.3 g/dL (ref 30.0–36.0)
MCV: 117.3 fL — AB (ref 78.0–100.0)
Platelets: 481 10*3/uL — ABNORMAL HIGH (ref 150–400)
RBC: 1.96 MIL/uL — ABNORMAL LOW (ref 4.22–5.81)
RDW: 16.5 % — ABNORMAL HIGH (ref 11.5–15.5)
WBC: 10.6 10*3/uL — ABNORMAL HIGH (ref 4.0–10.5)

## 2016-05-04 LAB — BASIC METABOLIC PANEL
ANION GAP: 9 (ref 5–15)
BUN: 13 mg/dL (ref 6–20)
CALCIUM: 8.5 mg/dL — AB (ref 8.9–10.3)
CHLORIDE: 103 mmol/L (ref 101–111)
CO2: 25 mmol/L (ref 22–32)
CREATININE: 0.59 mg/dL — AB (ref 0.61–1.24)
GFR calc Af Amer: >60 mL/min (ref >60)
GFR calc non Af Amer: >60 mL/min (ref >60)
GLUCOSE: 268 mg/dL — AB (ref 65–99)
Potassium: 3.8 mmol/L (ref 3.5–5.1)
Sodium: 137 mmol/L (ref 135–145)

## 2016-05-04 LAB — I-STAT TROPONIN, ED: Troponin i, poc: 0.01 ng/mL (ref 0.00–0.08)

## 2016-05-04 LAB — APTT: aPTT: 32 seconds (ref 24–36)

## 2016-05-04 LAB — POC OCCULT BLOOD, ED: FECAL OCCULT BLD: NEGATIVE

## 2016-05-04 LAB — GLUCOSE, CAPILLARY
GLUCOSE-CAPILLARY: 147 mg/dL — AB (ref 65–99)
GLUCOSE-CAPILLARY: 312 mg/dL — AB (ref 65–99)

## 2016-05-04 LAB — PROTIME-INR
INR: 1.19
Prothrombin Time: 15.1 seconds (ref 11.4–15.2)

## 2016-05-04 LAB — ABO/RH: ABO/RH(D): O POS

## 2016-05-04 IMAGING — DX DG CHEST 2V
2 series · 2 of 2 positions shown · non-contrast
Comparison: [DATE], [DATE].

CLINICAL DATA: 75-year-old with acute onset of left-sided chest
pain which began earlier today. Patient states an approximate 10 day
history of cough.

EXAM:
CHEST  2 VIEW

[w chest pa]
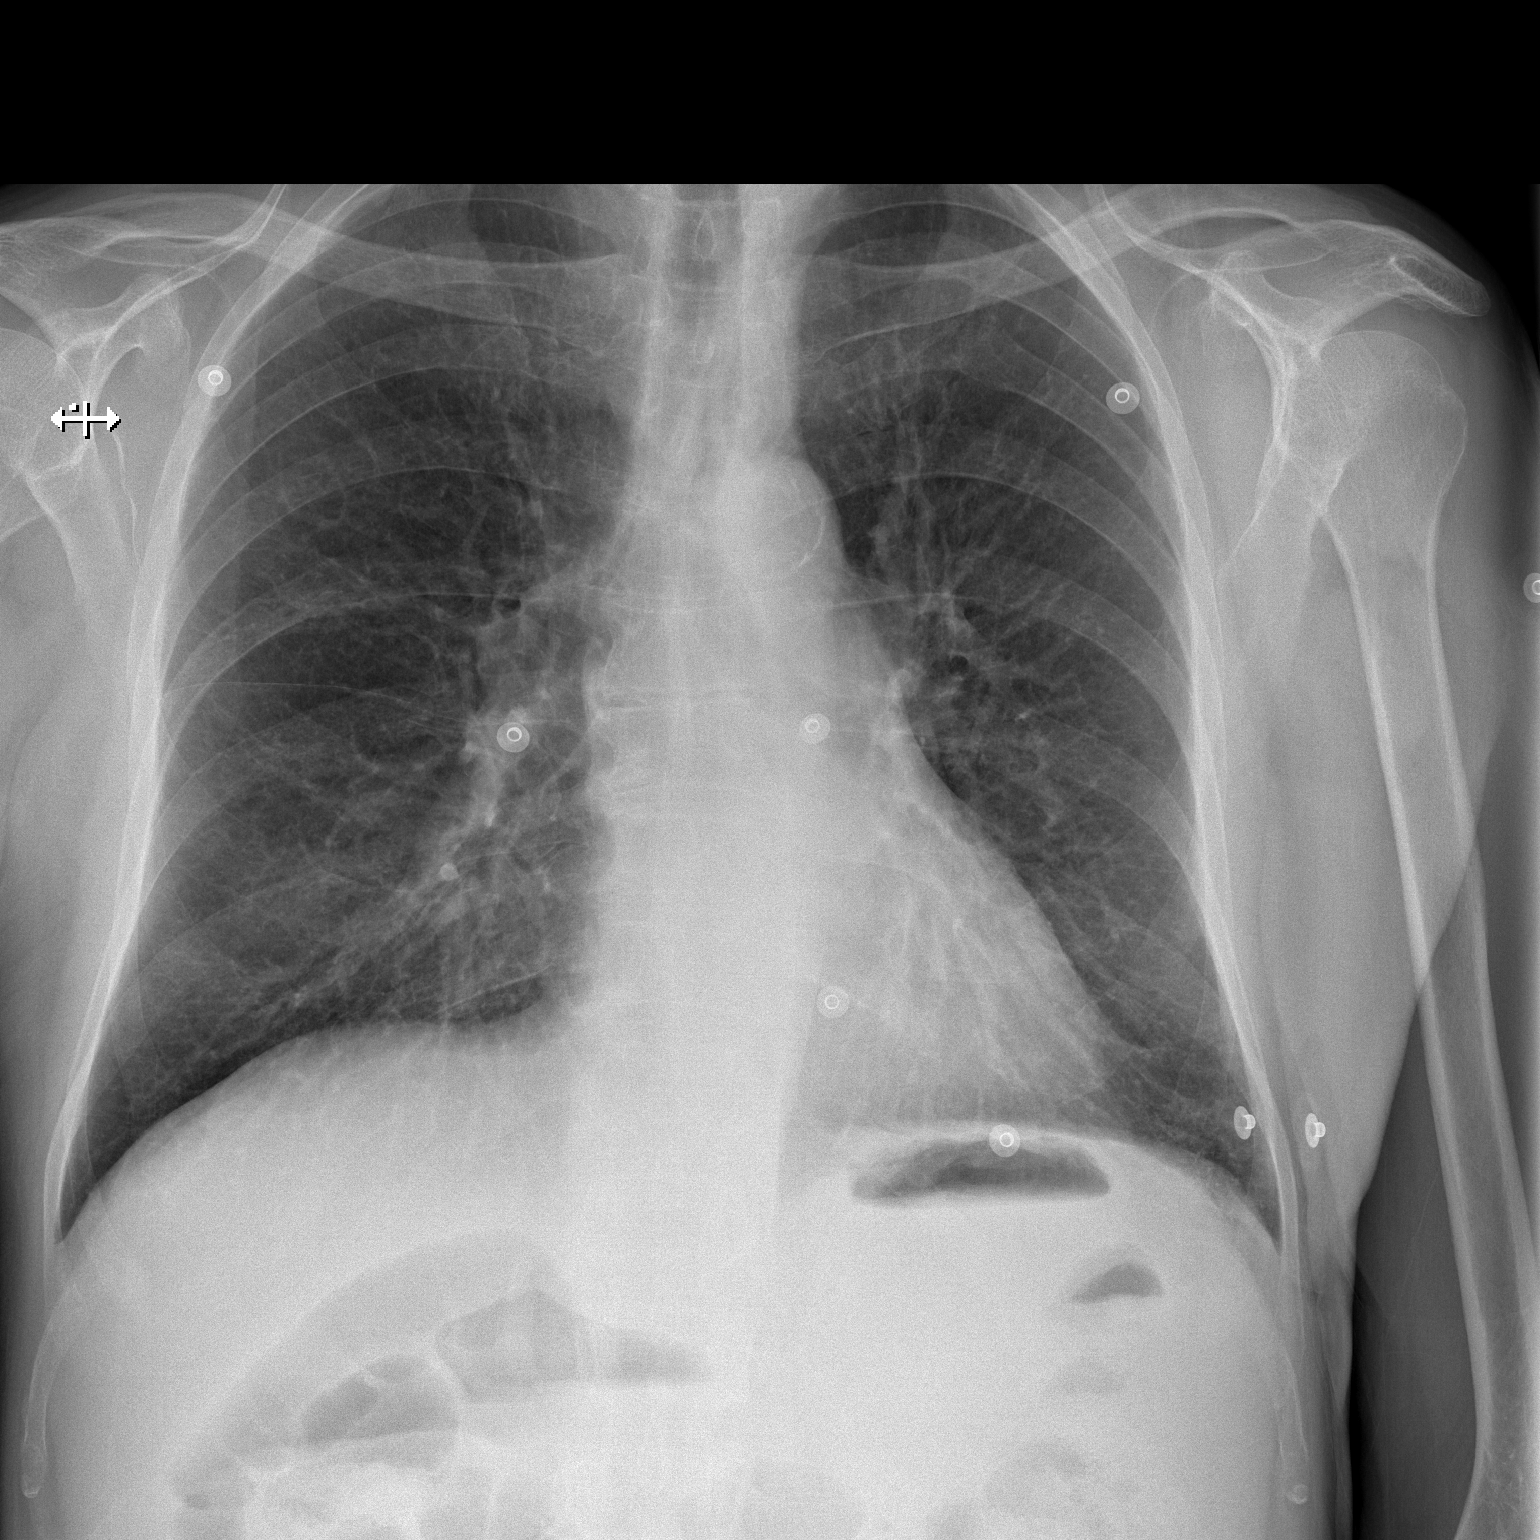

[w chest lat]
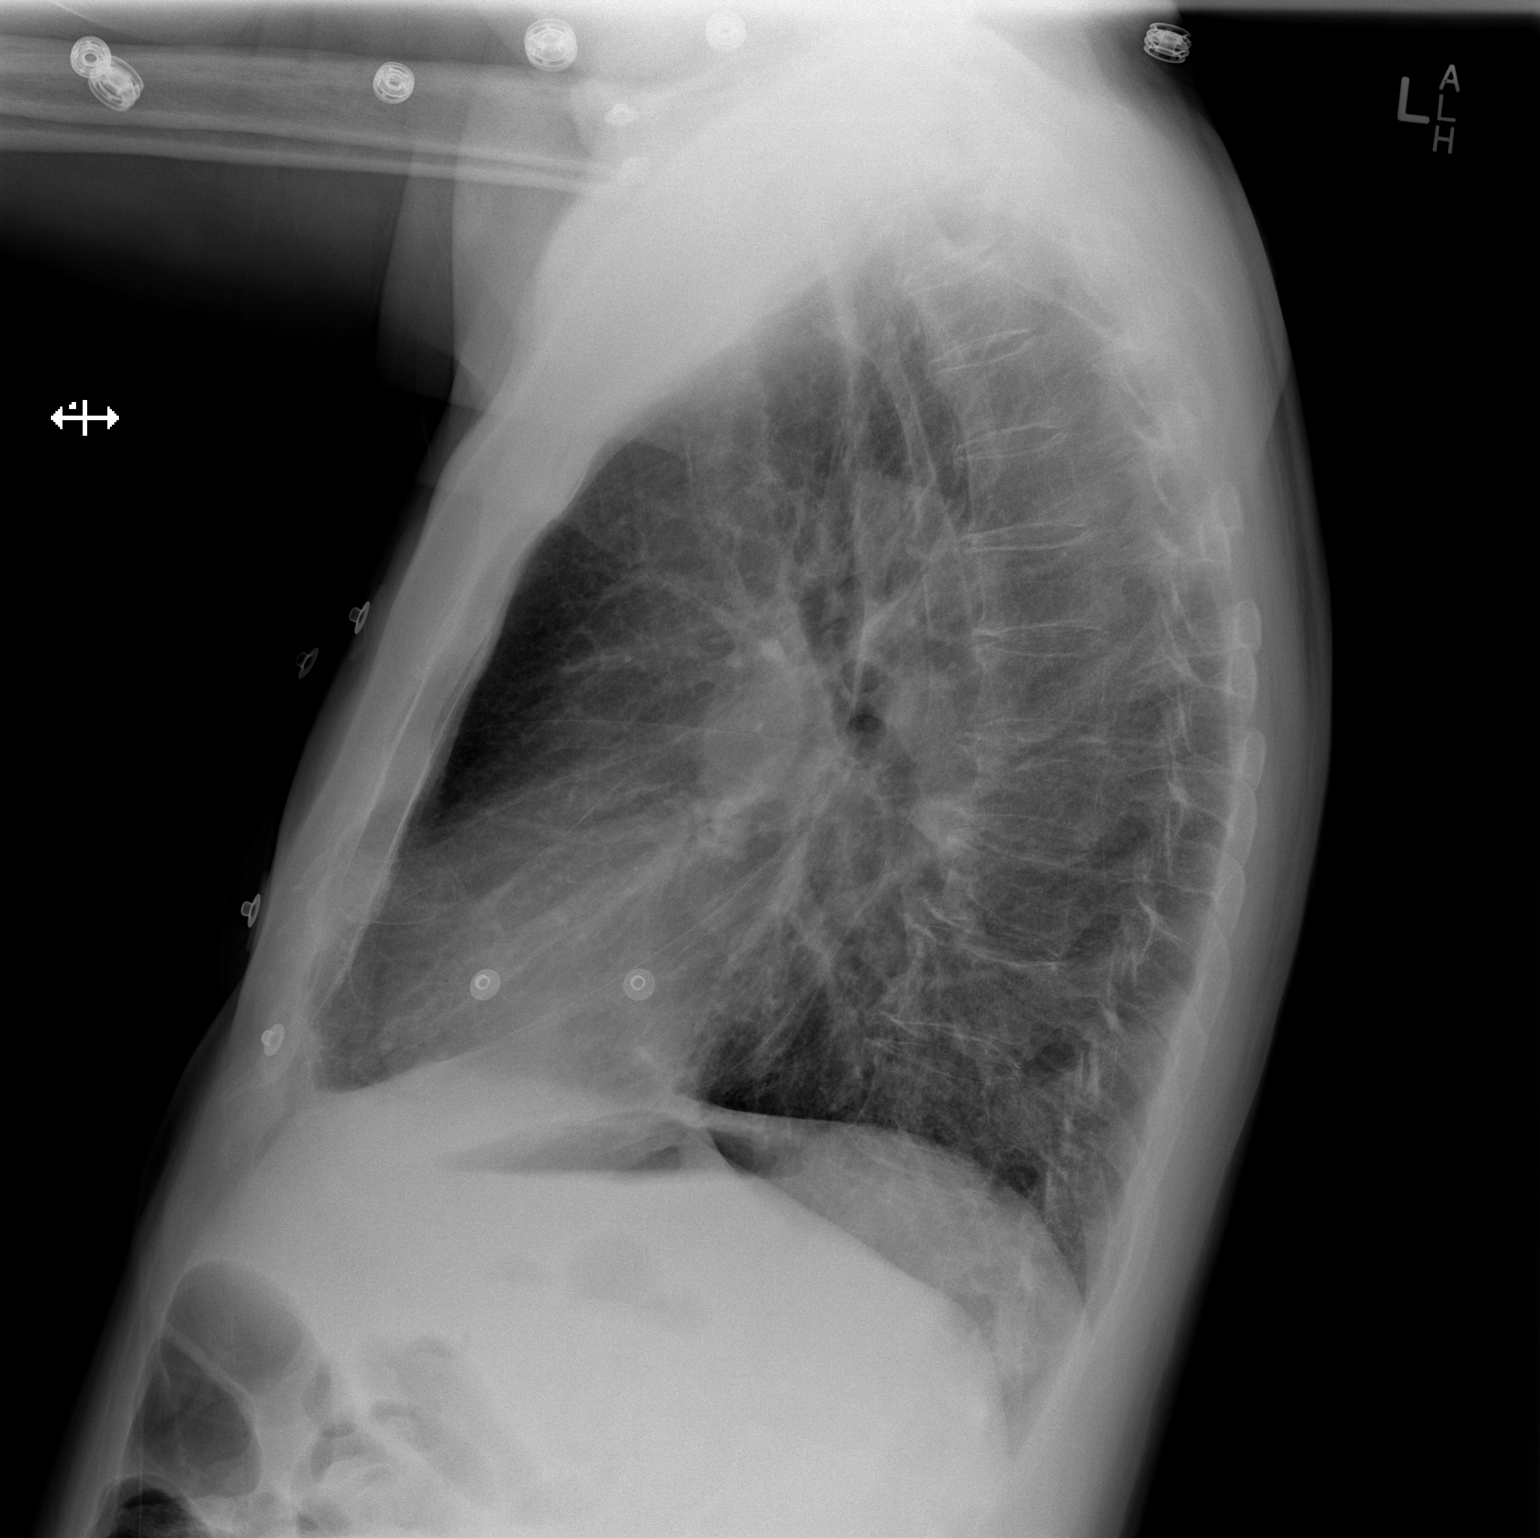

[2 of 2 positions shown; findings below may reference images not displayed]

FINDINGS: Cardiac silhouette normal in size, unchanged. Thoracic aorta
atherosclerotic, unchanged. Hilar and mediastinal contours otherwise
unremarkable. Lungs clear. Bronchovascular markings normal.
Pulmonary vascularity normal. No visible pleural effusions. No
pneumothorax. Degenerative changes involving the thoracic spine and
osseous demineralization.
IMPRESSION: 1.  No acute cardiopulmonary disease.
2. Thoracic aortic atherosclerosis.

## 2016-05-04 MED ORDER — SODIUM CHLORIDE 0.9% FLUSH
3.0000 mL | Freq: Two times a day (BID) | INTRAVENOUS | Status: DC
  Administered 2016-05-04 – 2016-05-05 (×3): 3 mL via INTRAVENOUS

## 2016-05-04 MED ORDER — ASPIRIN 81 MG PO TABS: 81.0000 mg | ORAL_TABLET | Freq: Every day | ORAL | Status: DC

## 2016-05-04 MED ORDER — SODIUM CHLORIDE 0.9% FLUSH
3.0000 mL | Freq: Two times a day (BID) | INTRAVENOUS | Status: DC
  Administered 2016-05-05: 3 mL via INTRAVENOUS

## 2016-05-04 MED ORDER — INSULIN ASPART 100 UNIT/ML ~~LOC~~ SOLN
0.0000 [IU] | Freq: Three times a day (TID) | SUBCUTANEOUS | Status: DC
  Administered 2016-05-05 (×2): 2 [IU] via SUBCUTANEOUS
  Administered 2016-05-05: 1 [IU] via SUBCUTANEOUS

## 2016-05-04 MED ORDER — ONDANSETRON HCL 4 MG/2ML IJ SOLN
4.0000 mg | Freq: Once | INTRAMUSCULAR | Status: DC
  Filled 2016-05-04: qty 2

## 2016-05-04 MED ORDER — MORPHINE SULFATE (PF) 4 MG/ML IV SOLN
4.0000 mg | Freq: Once | INTRAVENOUS | Status: DC | PRN
  Filled 2016-05-04: qty 1

## 2016-05-04 MED ORDER — SODIUM CHLORIDE 0.9 % IV BOLUS (SEPSIS)
500.0000 mL | Freq: Once | INTRAVENOUS | Status: AC
  Administered 2016-05-04: 500 mL via INTRAVENOUS

## 2016-05-04 MED ORDER — ACETAMINOPHEN 325 MG PO TABS: 650.0000 mg | ORAL_TABLET | Freq: Four times a day (QID) | ORAL | Status: DC | PRN

## 2016-05-04 MED ORDER — ACETAMINOPHEN 650 MG RE SUPP: 650.0000 mg | Freq: Four times a day (QID) | RECTAL | Status: DC | PRN

## 2016-05-04 MED ORDER — ASPIRIN 81 MG PO CHEW
324.0000 mg | CHEWABLE_TABLET | Freq: Once | ORAL | Status: AC
  Administered 2016-05-04: 324 mg via ORAL

## 2016-05-04 MED ORDER — ATORVASTATIN CALCIUM 80 MG PO TABS
80.0000 mg | ORAL_TABLET | Freq: Every day | ORAL | Status: DC
  Administered 2016-05-04: 80 mg via ORAL
  Filled 2016-05-04: qty 1

## 2016-05-04 MED ORDER — SODIUM CHLORIDE 0.9% FLUSH: 3.0000 mL | INTRAVENOUS | Status: DC | PRN

## 2016-05-04 MED ORDER — SODIUM CHLORIDE 0.9 % IV SOLN: 250.0000 mL | INTRAVENOUS | Status: DC | PRN

## 2016-05-04 MED ORDER — ENOXAPARIN SODIUM 40 MG/0.4ML ~~LOC~~ SOLN
40.0000 mg | SUBCUTANEOUS | Status: DC
  Administered 2016-05-04 – 2016-05-05 (×2): 40 mg via SUBCUTANEOUS
  Filled 2016-05-04 (×2): qty 0.4

## 2016-05-04 MED ORDER — ASPIRIN 81 MG PO CHEW
81.0000 mg | CHEWABLE_TABLET | Freq: Every day | ORAL | Status: DC
  Administered 2016-05-05: 81 mg via ORAL
  Filled 2016-05-04: qty 1

## 2016-05-04 NOTE — ED Notes (Signed)
Patient stable for transport at this time. Patient will be taken to 3W RN on telemetry by EMT

## 2016-05-04 NOTE — ED Notes (Signed)
I was present during the ED-PA's rectal exam of this patient.

## 2016-05-04 NOTE — ED Notes (Signed)
Assisted patient with ambulating to the bathroom.

## 2016-05-04 NOTE — ED Notes (Signed)
Ambulated patient in the hallway.. Patient maintain his O2 at 98 and pulse between 92-94 while ambulating.

## 2016-05-04 NOTE — ED Provider Notes (Signed)
[facility] DEPT Provider Note   CSN: [id number] Arrival date & time: 05/04/16  1611     History   Chief Complaint Chief Complaint  Patient presents with  . Chest Pain    HPI Tommy Peterson is a 75 y.o. male.  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 He states that the pain is "around his heart" sharp in nature without radiation.  It is reproducible with palpation of his chest wall. He was brought in by EMS and was given aspirin and nitroglycerin. He denies any change in his pain with their interventions and states that after arriving in the ER his pain gradually subsided and at the time of evaluation still had some constant 2/10 sharp pain located under his left arm.  His pain he states was worse with laying down and stretching out. Pain is not exacerbated with exertion or deep inspiration.  When the pain began at cause him to be extremely anxious. No associated diaphoresis, nausea, near syncope, palpitations, dyspnea.  He has had a cold and a dry cough for over a week with frequent night sweats however he denies any productive sputum or fever.  He denies orthopnea, PND, lower extremity edema or palpitations. He is insulin-dependent diabetic, denies smoking history, denies family history of cardiac disease. He denies any history of hypertension.  Over the past 3 weeks patient has had gradually worsening generalized fatigue, weakness and exertional dyspnea.  His wife states that he has become pale, tired and short of breath with his normal activities. He does have a history of anemia which was evaluated by heme AND has been stable over the past 2-3 years most recent visit was in July of this year, where his oncologist reportedly said he no longer needed to be seen.   His wife is concerned with his persistent cough which she states this lasted  much longer than the past week of cold symptoms. He also has been having dysphasia of liquids and solids or he frequently chokes and has to vomit up food/fluids.  He denies aspiration.  He has a hx of collagenous colitis, states he has chronic diarrhea which is currently managed well with budesonide. Last colonoscopy was 4 years ago. Denies melena, hematochezia, hemetemesis    Past Medical History:  Diagnosis Date  . Cataract   . Diabetes mellitus without complication ([place])   . Dyslipidemia     Patient Active Problem List   Diagnosis Date Noted  . Chest pain 05/04/2016  . Benign fibroma of prostate 09/20/2015  . HLD (hyperlipidemia) 09/20/2015  . BP (high blood pressure) 09/20/2015  . Arthritis, degenerative 09/20/2015  . Colitis, collagenous 09/09/2015  . Absolute anemia 09/08/2014  . Adenoma of large intestine 07/13/2014  . Disturbance of skin sensation 01/09/2014  . Diabetes ([place]) 06/24/2013  . Recurrent major depressive disorder, in full remission ([place]) 07/18/2012  . Allergic rhinitis 08/29/2011    Past Surgical History:  Procedure Laterality Date  . CATARACT EXTRACTION     bilateral  . HERNIA REPAIR     right inguinal  . PROSTATE SURGERY     BPH  . TONSILLECTOMY      Home Medications    Prior to Admission medications   Medication Sig Start Date End Date Taking? Authorizing Provider  aspirin 81 MG tablet Take 81 mg by mouth daily.   Yes Historical  Provider, MD  budesonide (ENTOCORT EC) 3 MG 24 hr capsule Take 9 mg by mouth daily as needed (for diarrhea).    Yes Historical Provider, MD  LANTUS SOLOSTAR 100 UNIT/ML Solostar Pen Inject 24 Units into the skin at bedtime.  01/05/14  Yes Historical Provider, MD  triamcinolone cream (KENALOG) 0.1 % Apply 1 application topically 2 (two) times daily. Patient taking differently: Apply 1 application topically 2 (two) times daily as needed (for basel cell spots on forehead).  02/14/16  Yes Jaynee Eagles, PA-C    Family  History Family History  Problem Relation Age of Onset  . Diabetes Mother   . Heart failure Mother     Social History Social History  Substance Use Topics  . Smoking status: Former Smoker    Types: Cigarettes    Quit date: 05/04/1996  . Smokeless tobacco: Never Used     Comment: 09/03/1996  . Alcohol use Yes     Comment: a glass of wine every other day     Allergies   Bee venom and Metformin and related   Review of Systems Review of Systems  Constitutional: Positive for activity change and fatigue. Negative for appetite change, chills, diaphoresis and fever.  HENT: Positive for congestion.   Respiratory: Positive for cough. Negative for apnea, choking, chest tightness, shortness of breath, wheezing and stridor.   Cardiovascular: Positive for chest pain. Negative for palpitations and leg swelling.  Gastrointestinal: Positive for diarrhea (chronic). Negative for abdominal pain, blood in stool, constipation, nausea, rectal pain and vomiting.  Genitourinary: Negative.   Musculoskeletal: Negative.   Neurological: Negative.   Hematological: Negative.   Psychiatric/Behavioral: Negative.   All other systems reviewed and are negative.    Physical Exam Updated Vital Signs BP 131/65   Pulse 79   Temp 98.7 F (37.1 C) (Oral)   Resp 17   Ht 5\' 10"  (1.778 m)   Wt 71.2 kg   SpO2 95%   BMI 22.53 kg/m   Physical Exam  Constitutional: He is oriented to person, place, and time. He appears well-developed and well-nourished. No distress.  HENT:  Head: Normocephalic and atraumatic.  Right Ear: External ear normal.  Left Ear: External ear normal.  Nose: Nose normal.  Mouth/Throat: Oropharynx is clear and moist. No oropharyngeal exudate.  Palpebral conjunctival pallor  Eyes: Conjunctivae and EOM are normal. Pupils are equal, round, and reactive to light. Right eye exhibits no discharge. Left eye exhibits no discharge. No scleral icterus.  Neck: Normal range of motion. Neck supple.  No JVD present. No tracheal deviation present.  Cardiovascular: Normal rate, regular rhythm, normal heart sounds and intact distal pulses.  Exam reveals no gallop and no friction rub.   No murmur heard.   Pulmonary/Chest: Effort normal and breath sounds normal. No stridor. No respiratory distress. He has no wheezes. He has no rales. He exhibits tenderness.  Abdominal: Soft. Bowel sounds are normal. He exhibits no distension and no mass. There is no tenderness. There is no guarding.  Genitourinary: Rectal exam shows guaiac negative stool.  Musculoskeletal: Normal range of motion. He exhibits no edema.  Lymphadenopathy:    He has no cervical adenopathy.  Neurological: He is alert and oriented to person, place, and time. He exhibits normal muscle tone. Coordination normal.  Skin: Skin is warm and dry. Capillary refill takes less than 2 seconds. No rash noted. He is not diaphoretic. No erythema. There is pallor.  Psychiatric: He has a normal mood and affect. His behavior is  normal. Judgment and thought content normal.  Nursing note and vitals reviewed.    ED Treatments / Results  Labs (all labs ordered are listed, but only abnormal results are displayed) Labs Reviewed  BASIC METABOLIC PANEL - Abnormal; Notable for the following:       Result Value   Glucose, Bld 268 (*)    Creatinine, Ser 0.59 (*)    Calcium 8.5 (*)    All other components within normal limits  CBC - Abnormal; Notable for the following:    WBC 10.6 (*)    RBC 1.96 (*)    Hemoglobin 7.2 (*)    HCT 23.0 (*)    MCV 117.3 (*)    MCH 36.7 (*)    RDW 16.5 (*)    Platelets 481 (*)    All other components within normal limits  I-STAT TROPOININ, ED  POC OCCULT BLOOD, ED  TYPE AND SCREEN  ABO/RH    EKG  EKG Interpretation  Date/Time:  Thursday May 04 2016 16:11:46 EDT Ventricular Rate:  96 PR Interval:    QRS Duration: 96 QT Interval:  357 QTC Calculation: 452 R Axis:   -34 Text Interpretation:  Sinus  rhythm Left axis deviation No significant change since last tracing Confirmed by Alvino Chapel  MD, Ovid Curd [phone]) on 05/04/2016 4:19:00 PM Also confirmed by Alvino Chapel  MD, NATHAN [phone]), editor [place], Joelene Millin [phone])  on 05/04/2016 5:40:26 PM       Radiology Dg Chest 2 View  Result Date: 05/04/2016 CLINICAL DATA:  75 year old with acute onset of left-sided chest pain which began earlier today. Patient states an approximate 10 day history of cough. EXAM: CHEST  2 VIEW COMPARISON:  12/21/2013, 06/28/2013. FINDINGS: Cardiac silhouette normal in size, unchanged. Thoracic aorta atherosclerotic, unchanged. Hilar and mediastinal contours otherwise unremarkable. Lungs clear. Bronchovascular markings normal. Pulmonary vascularity normal. No visible pleural effusions. No pneumothorax. Degenerative changes involving the thoracic spine and osseous demineralization. IMPRESSION: 1.  No acute cardiopulmonary disease. 2. Thoracic aortic atherosclerosis. Electronically Signed   By: Evangeline Dakin M.D.   On: 05/04/2016 17:07    Procedures Procedures (including critical care time)  Medications Ordered in ED Medications  ondansetron (ZOFRAN) injection 4 mg (4 mg Intravenous Not Given 05/04/16 2008)  morphine 4 MG/ML injection 4 mg (not administered)  sodium chloride 0.9 % bolus 500 mL (500 mLs Intravenous New Bag/Given 05/04/16 2008)     Initial Impression / Assessment and Plan / ED Course  I have reviewed the triage vital signs and the nursing notes.  Pertinent labs & imaging results that were available during my care of the patient were reviewed by me and considered in my medical decision making (see chart for details).  Clinical Course   75 y/o male with CC of CP, hx of IDDM, dyslipidemia.   Chest pain onset at 11:30 AM, constant, left-sided, sharp without radiation, gradually improves once in the ER, that her evaluation is 2/10. Chest pain worked up in the ER, chest x-ray negative, EKG normal sinus  rhythm, initial troponin negative, basic blood work obtained, pertinent for anemia hemoglobin 7.2, drop from baseline of 10 with a known macrocytic anemia.  Patient denies any abdominal pain, hematemesis, melena or hematochezia. He last had a colonoscopy  about 4 years ago.  He has a history of collagenous colitis and chronic diarrhea,  rectal stenosis as well.  patient states that he cannot tolerate digital rectal exams or any further colonoscopies because of severe side effects.  He allowed Hemoccult which  was negative.  His most recently seen by heme/onc 2 months ago, had unchanged macrocytic anemia Hb~10.   Will be admitted for CP r/o obs admission.  Dr. Maudie Mercury admitting  Final Clinical Impressions(s) / ED Diagnoses   Final diagnoses:  Chest pain, unspecified chest pain type  Symptomatic anemia    New Prescriptions New Prescriptions   No medications on file     Delsa Grana, Hershal Coria 05/06/16 [place], MD 05/07/16 [phone]

## 2016-05-04 NOTE — H&P (Addendum)
TRH H&P   Patient Demographics:    Tommy Peterson, is a 75 y.o. male  MRN: [id number]   DOB - 1940/08/25  Admit Date - 05/04/2016  Outpatient Primary MD for the patient is Janett Billow, MD  Referring MD/NP/PA: Delsa Grana  Outpatient Specialists:   Dr. Tasia Catchings (hematology/onc at [facility])    Patient coming from:   From out and about  Chief Complaint  Patient presents with  . Chest Pain      HPI:    Tommy Peterson  is a 75 y.o. male, w Dm2 started to have cp left sided at about 11:30 this am.  Had pain for about 3 hours, and went to urgent Care at [facility] and was sent to ER for evaluation.  Pt denies radiation of the pain to neck or left arm.  Denies fever, chills, cough , dyspnea, gerd, n/v, diarrhea, brbpr, black stool.  Slg nitro en route dropped bp, and didn't relieve his cp.  In ED,  Pt was was seen trop negative. EKG nsr at 95, nl axis, nl in, poor R progression, no st-t changes c/w ischemia.  CXR => no acute process.  Pt will be admitted for cp rule out.     Review of systems:    In addition to the HPI above,  No Fever-chills, No Headache, No changes with Vision or hearing, No problems swallowing food or Liquids, No Cough or Shortness of Breath, No Abdominal pain, No Nausea or Vommitting, Bowel movements are regular, No Blood in stool or Urine, No dysuria, No new skin rashes or bruises, No new joints pains-aches,  No new weakness, tingling, numbness in any extremity, No recent weight gain or loss, No polyuria, polydypsia or polyphagia, No significant Mental Stressors.  A full 10 point Review of Systems was done, except as stated above, all other Review of Systems were negative.   With Past History of the following :    Past Medical History:  Diagnosis Date  . Anemia   . Cataract   . Diabetes mellitus without complication ([place])   . Dyslipidemia   .  Macrocytosis   . Monoclonal gammopathy       Past Surgical History:  Procedure Laterality Date  . BONE MARROW BIOPSY Right 2016  . CATARACT EXTRACTION     bilateral  . HERNIA REPAIR     right inguinal  . PENILE PROSTHESIS IMPLANT    . PROSTATE SURGERY     BPH  . TONSILLECTOMY        Social History:     Social History  Substance Use Topics  . Smoking status: Former Smoker    Types: Cigarettes    Quit date: 05/04/1996  . Smokeless tobacco: Never Used     Comment: 09/03/1996  . Alcohol use Yes     Comment: a glass of wine every other day  Lives - at home  Mobility -   Walks without assistance   Family History :     Family History  Problem Relation Age of Onset  . Diabetes Mother   . Heart failure Mother       Home Medications:   Prior to Admission medications   Medication Sig Start Date End Date Taking? Authorizing Provider  aspirin 81 MG tablet Take 81 mg by mouth daily.   Yes Historical Provider, MD  budesonide (ENTOCORT EC) 3 MG 24 hr capsule Take 9 mg by mouth daily as needed (for diarrhea).    Yes Historical Provider, MD  LANTUS SOLOSTAR 100 UNIT/ML Solostar Pen Inject 24 Units into the skin at bedtime.  01/05/14  Yes Historical Provider, MD  triamcinolone cream (KENALOG) 0.1 % Apply 1 application topically 2 (two) times daily. Patient taking differently: Apply 1 application topically 2 (two) times daily as needed (for basel cell spots on forehead).  02/14/16  Yes Jaynee Eagles, PA-C     Allergies:     Allergies  Allergen Reactions  . Bee Venom Swelling  . Metformin And Related Nausea Only     Physical Exam:   Vitals  Blood pressure 131/65, pulse 79, temperature 98.7 F (37.1 C), temperature source Oral, resp. rate 17, height '5\' 10"'$  (1.778 m), weight 71.2 kg (157 lb), SpO2 95 %.   1. General  lying in bed in NAD,    2. Normal affect and insight, Not Suicidal or Homicidal, Awake Alert, Oriented X 3.  3. No F.N deficits, ALL C.Nerves Intact,  Strength 5/5 all 4 extremities, Sensation intact all 4 extremities, Plantars down going.   4. Ears and Eyes appear Normal, Conjunctivae clear, PERRLA. Moist Oral Mucosa.  5. Supple Neck, No JVD, No cervical lymphadenopathy appriciated, No Carotid Bruits.  6. Symmetrical Chest wall movement, Good air movement bilaterally, CTAB.  7. RRR, S1, S2, 1/6 sem apex,    8. Positive Bowel Sounds, Abdomen Soft, No tenderness, No organomegaly appriciated,No rebound -guarding or rigidity.  9.  No Cyanosis, Normal Skin Turgor, No Skin Rash or Bruise.  10. Good muscle tone,  joints appear normal , no effusions, Normal ROM.  11. No Palpable Lymph Nodes in Neck or Axillae     Data Review:    CBC  Recent Labs Lab 05/04/16 1621  WBC 10.6*  HGB 7.2*  HCT 23.0*  PLT 481*  MCV 117.3*  MCH 36.7*  MCHC 31.3  RDW 16.5*   ------------------------------------------------------------------------------------------------------------------  Chemistries   Recent Labs Lab 05/04/16 1621  NA 137  K 3.8  CL 103  CO2 25  GLUCOSE 268*  BUN 13  CREATININE 0.59*  CALCIUM 8.5*   ------------------------------------------------------------------------------------------------------------------ estimated creatinine clearance is 79.1 mL/min (by C-G formula based on SCr of 0.59 mg/dL (L)). ------------------------------------------------------------------------------------------------------------------ No results for input(s): TSH, T4TOTAL, T3FREE, THYROIDAB in the last 72 hours.  Invalid input(s): FREET3  Coagulation profile No results for input(s): INR, PROTIME in the last 168 hours. ------------------------------------------------------------------------------------------------------------------- No results for input(s): DDIMER in the last 72 hours. -------------------------------------------------------------------------------------------------------------------  Cardiac Enzymes No results for  input(s): CKMB, TROPONINI, MYOGLOBIN in the last 168 hours.  Invalid input(s): CK ------------------------------------------------------------------------------------------------------------------ No results found for: BNP   ---------------------------------------------------------------------------------------------------------------  Urinalysis    Component Value Date/Time   COLORURINE AMBER (A) 08/01/2015 1801   APPEARANCEUR CLOUDY (A) 08/01/2015 1801   LABSPEC 1.030 08/01/2015 1801   PHURINE 5.5 08/01/2015 1801   GLUCOSEU 250 (A) 08/01/2015 1801   HGBUR NEGATIVE 08/01/2015 1801  BILIRUBINUR SMALL (A) 08/01/2015 1801   KETONESUR 15 (A) 08/01/2015 1801   PROTEINUR 30 (A) 08/01/2015 1801   NITRITE NEGATIVE 08/01/2015 1801   LEUKOCYTESUR TRACE (A) 08/01/2015 1801    ----------------------------------------------------------------------------------------------------------------   Imaging Results:    Dg Chest 2 View  Result Date: 05/04/2016 CLINICAL DATA:  75 year old with acute onset of left-sided chest pain which began earlier today. Patient states an approximate 10 day history of cough. EXAM: CHEST  2 VIEW COMPARISON:  12/21/2013, 06/28/2013. FINDINGS: Cardiac silhouette normal in size, unchanged. Thoracic aorta atherosclerotic, unchanged. Hilar and mediastinal contours otherwise unremarkable. Lungs clear. Bronchovascular markings normal. Pulmonary vascularity normal. No visible pleural effusions. No pneumothorax. Degenerative changes involving the thoracic spine and osseous demineralization. IMPRESSION: 1.  No acute cardiopulmonary disease. 2. Thoracic aortic atherosclerosis. Electronically Signed   By: Evangeline Dakin M.D.   On: 05/04/2016 17:07      Assessment & Plan:    Active Problems:   Diabetes ([place])   Absolute anemia   Chest pain    1. Cp Tele Trop I q6h x3 Check cardiac echo NPO after midnite Nuclear stress test Aspirin was given in ED, can renew tomorrow  if stress testing positive Will not order a routine medication due to significant anemia Start Lipitor '80mg'$  po qhs,  Check lipid panel in am  Cardiac murmer ? 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  Code Status FULL CODE  Likely DC to  home  Condition GUARDED    Consults called: none  Admission status: observation  Time spent in minutes : 45 minutes   Jani Gravel M.D on 05/04/2016 at 9:27 PM  Between 7am to 7pm - Pager - [phone]. After 7pm go to [email or web address] - password [facility]  [facility] - Office  [phone]

## 2016-05-04 NOTE — ED Notes (Signed)
MD at the bedside  

## 2016-05-04 NOTE — Progress Notes (Signed)
05/04/2016 3:33 PM   DOB: [birth year] / MRN: [id number]  SUBJECTIVE:  Tommy Peterson is a 75 y.o. male presenting for for left sided substernal chest pain that started about 2 hours ago.  He denies shortness of breath, nausea, radicular pain.  He has been coughing. His wife feels that he has a little more tired over the last week.  Nothing makes the pain worse or better. He has not taken any ASA today.  He has a 30 year history of diabetes. He is not taking statins or lisinopril.      He is allergic to bee venom and metformin and related.   He  has a past medical history of Basal cell cancer; Cataract; Diabetes mellitus without complication ([place]); Dyslipidemia; and Hypertension.    He  reports that he has quit smoking. His smoking use included Cigarettes. He has never used smokeless tobacco. He reports that he does not drink alcohol or use drugs. He  reports that he does not engage in sexual activity. The patient  has a past surgical history that includes Hernia repair; ostate surgery; Cataract extraction; and Tonsillectomy.  His family history includes Diabetes in his mother; Heart failure in his mother.  Review of Systems  Constitutional: Negative for chills, fever and weight loss.  Respiratory: Negative for cough, hemoptysis, sputum production, shortness of breath and wheezing.   Cardiovascular: Positive for chest pain. Negative for palpitations, orthopnea, claudication, leg swelling and PND.  Skin: Negative for itching and rash.  Neurological: Negative for dizziness and headaches.  Psychiatric/Behavioral: Negative for depression.    The problem list and medications were reviewed and updated by myself where necessary and exist elsewhere in the encounter.   OBJECTIVE:  BP 140/64 (BP Location: Left Arm, Cuff Size: Normal)   Pulse (!) 105   Resp 18   SpO2 97%   Physical Exam  Constitutional: He is oriented to person, place, and time. He appears well-developed and well-nourished.   Non-toxic appearance. He does not have a sickly appearance. He does not appear ill. He appears distressed.  Cardiovascular: Normal rate and regular rhythm.   Pulmonary/Chest: Effort normal and breath sounds normal.  Musculoskeletal: Normal range of motion.  Neurological: He is alert and oriented to person, place, and time. He has normal reflexes.  Skin: Skin is warm and dry. He is not diaphoretic.  Psychiatric: He has a normal mood and affect. His behavior is normal. Judgment and thought content normal.  Vitals reviewed.   No results found for this or any previous visit (from the past 72 hour(s)).  No results found.   ASSESSMENT AND PLAN  Lazer was seen today for chest pain.  Diagnoses and all orders for this visit:  Other chest pain: He has a history of DM2.  He is not taking statins or lisinopril. He looks uncomfortable.  He is tachycardic.  Distant smoker with a 10 pack year history.  EKG without signs of ischemia or infarction however this could be NSTEMI.  He needs enzymes.  I am sending him to [facility] ED under the care of EMS. We have given him 4 (81 mg) ASA.  We held nitro given this may be right sided.   -     EKG 12-Lead    The patient is advised to call or return to clinic if he does not see an improvement in symptoms, or to seek the care of the closest emergency department if he worsens with the above plan.   Philis Fendt,  MHS, PA-C [facility] 05/04/2016 3:33 PM

## 2016-05-04 NOTE — ED Triage Notes (Signed)
Per EMS, pt was out running errands with wife riding in the car and began having left sided non radiating CP. No cardiac hx. Pt has had a cold x 10 days with non productive cough. Pt given 324 asa and 1 of nitro. After Nitro BP fell from [id number] systolic to [id number]. Nitro brought pain from 8/10 down to 5/10, pain now back to 8/10. CBG 315, pt has DM hx. VSS. Breath sounds slightly diminished in left base, clear elsewhere. 12 lead showed sinus arrythmia. Pt alert and oriented x 4.

## 2016-05-05 ENCOUNTER — Encounter (HOSPITAL_COMMUNITY): Payer: Self-pay | Admitting: Physician Assistant

## 2016-05-05 DIAGNOSIS — D649 Anemia, unspecified: Secondary | ICD-10-CM

## 2016-05-05 DIAGNOSIS — E119 Type 2 diabetes mellitus without complications: Secondary | ICD-10-CM | POA: Diagnosis not present

## 2016-05-05 DIAGNOSIS — R079 Chest pain, unspecified: Secondary | ICD-10-CM | POA: Diagnosis not present

## 2016-05-05 DIAGNOSIS — Z794 Long term (current) use of insulin: Secondary | ICD-10-CM | POA: Diagnosis not present

## 2016-05-05 LAB — LIPID PANEL
CHOL/HDL RATIO: 4.1 ratio
Cholesterol: 111 mg/dL (ref 0–200)
HDL: 27 mg/dL — AB (ref >40)
LDL CALC: 63 mg/dL (ref 0–99)
TRIGLYCERIDES: 106 mg/dL (ref <150)
VLDL: 21 mg/dL (ref 0–40)

## 2016-05-05 LAB — CBC
HCT: 23.8 % — ABNORMAL LOW (ref 39.0–52.0)
HEMATOCRIT: 9.9 % — AB (ref 39.0–52.0)
HEMOGLOBIN: 7.5 g/dL — AB (ref 13.0–17.0)
Hemoglobin: 9.9 g/dL — ABNORMAL LOW (ref 13.0–17.0)
MCH: 37.1 pg — AB (ref 26.0–34.0)
MCH: 34.3 pg — ABNORMAL HIGH (ref 26.0–34.0)
MCHC: 31.5 g/dL (ref 30.0–36.0)
MCHC: 32.4 g/dL (ref 30.0–36.0)
MCV: 117.8 fL — ABNORMAL HIGH (ref 78.0–100.0)
MCV: 105.9 fL — AB (ref 78.0–100.0)
Platelets: 455 10*3/uL — ABNORMAL HIGH (ref 150–400)
Platelets: 482 10*3/uL — ABNORMAL HIGH (ref 150–400)
RBC: 2.02 MIL/uL — ABNORMAL LOW (ref 4.22–5.81)
RBC: 2.89 MIL/uL — ABNORMAL LOW (ref 4.22–5.81)
RDW: 16.8 % — ABNORMAL HIGH (ref 11.5–15.5)
WBC: 11.6 10*3/uL — ABNORMAL HIGH (ref 4.0–10.5)
WBC: 16.2 10*3/uL — ABNORMAL HIGH (ref 4.0–10.5)

## 2016-05-05 LAB — GLUCOSE, CAPILLARY
GLUCOSE-CAPILLARY: 181 mg/dL — AB (ref 65–99)
Glucose-Capillary: 141 mg/dL — ABNORMAL HIGH (ref 65–99)
Glucose-Capillary: 164 mg/dL — ABNORMAL HIGH (ref 65–99)
Glucose-Capillary: 174 mg/dL — ABNORMAL HIGH (ref 65–99)
Glucose-Capillary: 199 mg/dL — ABNORMAL HIGH (ref 65–99)
Glucose-Capillary: 202 mg/dL — ABNORMAL HIGH (ref 65–99)

## 2016-05-05 LAB — COMPREHENSIVE METABOLIC PANEL
ALBUMIN: 2.9 g/dL — AB (ref 3.5–5.0)
ALK PHOS: 63 U/L (ref 38–126)
ALT: 8 U/L — AB (ref 17–63)
ANION GAP: 4 — AB (ref 5–15)
AST: 9 U/L — ABNORMAL LOW (ref 15–41)
BUN: 11 mg/dL (ref 6–20)
CALCIUM: 8.5 mg/dL — AB (ref 8.9–10.3)
CO2: 26 mmol/L (ref 22–32)
Chloride: 109 mmol/L (ref 101–111)
Creatinine, Ser: 0.53 mg/dL — ABNORMAL LOW (ref 0.61–1.24)
GFR calc Af Amer: >60 mL/min (ref >60)
GFR calc non Af Amer: >60 mL/min (ref >60)
GLUCOSE: 192 mg/dL — AB (ref 65–99)
Potassium: 4.3 mmol/L (ref 3.5–5.1)
SODIUM: 139 mmol/L (ref 135–145)
Total Bilirubin: 0.7 mg/dL (ref 0.3–1.2)
Total Protein: 5.6 g/dL — ABNORMAL LOW (ref 6.5–8.1)

## 2016-05-05 LAB — TROPONIN I
Troponin I: <0.03 ng/mL (ref <0.03)
Troponin I: <0.03 ng/mL (ref <0.03)

## 2016-05-05 LAB — PREPARE RBC (CROSSMATCH)

## 2016-05-05 MED ORDER — INSULIN GLARGINE 100 UNIT/ML ~~LOC~~ SOLN
20.0000 [IU] | Freq: Every day | SUBCUTANEOUS | Status: DC
  Administered 2016-05-05: 20 [IU] via SUBCUTANEOUS
  Filled 2016-05-05 (×2): qty 0.2

## 2016-05-05 MED ORDER — SODIUM CHLORIDE 0.9 % IV SOLN: Freq: Once | INTRAVENOUS | Status: DC

## 2016-05-05 MED ORDER — FUROSEMIDE 10 MG/ML IJ SOLN
20.0000 mg | Freq: Once | INTRAMUSCULAR | Status: AC
  Administered 2016-05-05: 20 mg via INTRAVENOUS
  Filled 2016-05-05: qty 2

## 2016-05-05 NOTE — Consult Note (Signed)
Cardiology Consultation Note    Patient ID: Tommy Peterson, MRN: [id number], DOB/AGE: 76/24/1942 76 y.o. Admit date: 05/04/2016   Date of Consult: 05/05/2016 Primary Physician: Janett Billow, MD Primary Cardiologist: New to Dr. Debara Pickett  Chief Complaint: chest pain Reason for Consultation: chest pain; admitted for 3gm Hgb drop Requesting MD: Dr. Nira Retort pain unit consult  HPI: Tommy Peterson is a 75 y.o. male with history of anemia/monoclonal gammopathy per chart (previously followed by Dr. Tasia Catchings at [facility] - pt states he was told in the past no further w/u needed), diabetes, former tobacco abuse (20 yrs) who presented to [facility] with chest pain. Yesterday around lunchtime while out and about without any particular strenuous activity he developed a poking sensation of pain "around my heart" in his left chest wall. It felt like someone was pressing the area. It radiated under his left armpit but not down to his arm. No associated SOB, nausea, vomiting, diaphoresis. The pain was constant. It was slightly worse when he laid back. Not worse with exertion, inspiration. He went to urgent care due to persistent pain and was referred to the ED. He received baby ASA and SL NTG without relief. The pain eventually resolved on its own after at least 4 hours if not longer. It has not recurred. He is very active and walks a lot and has not had any exertional symptoms. He has no family history of heart disease per his report although someone entered HF in his family history. He remains pain free.  Upon admission was noted to have neg troponin x 3, but Hgb low at 7.2 (compared to prior values of 10-11). FOBT neg. No acute source of bleeding per patient. He is set up to receive 2 u PRBC this AM. He does note he's felt "weaker" the past week.  LDL 63.  Past Medical History:  Diagnosis Date  . Anemia   . Cataract   . Diabetes mellitus without complication ([place])   . Dyslipidemia   . Macrocytosis   . Monoclonal  gammopathy       Surgical History:  Past Surgical History:  Procedure Laterality Date  . BONE MARROW BIOPSY Right 2016  . CATARACT EXTRACTION     bilateral  . HERNIA REPAIR     right inguinal  . PENILE PROSTHESIS IMPLANT    . PROSTATE SURGERY     BPH  . TONSILLECTOMY       Home Meds: Prior to Admission medications   Medication Sig Start Date End Date Taking? Authorizing Provider  aspirin 81 MG tablet Take 81 mg by mouth daily.   Yes Historical Provider, MD  budesonide (ENTOCORT EC) 3 MG 24 hr capsule Take 9 mg by mouth daily as needed (for diarrhea).    Yes Historical Provider, MD  LANTUS SOLOSTAR 100 UNIT/ML Solostar Pen Inject 24 Units into the skin at bedtime.  01/05/14  Yes Historical Provider, MD  triamcinolone cream (KENALOG) 0.1 % Apply 1 application topically 2 (two) times daily. Patient taking differently: Apply 1 application topically 2 (two) times daily as needed (for basel cell spots on forehead).  02/14/16  Yes Jaynee Eagles, PA-C    Inpatient Medications:  . sodium chloride   Intravenous Once  . aspirin  81 mg Oral Daily  . atorvastatin  80 mg Oral q1800  . enoxaparin (LOVENOX) injection  40 mg Subcutaneous Q24H  . furosemide  20 mg Intravenous Once  . insulin aspart  0-9 Units Subcutaneous TID WC  . ondansetron (  ZOFRAN) IV  4 mg Intravenous Once  . sodium chloride flush  3 mL Intravenous Q12H  . sodium chloride flush  3 mL Intravenous Q12H      Allergies:  Allergies  Allergen Reactions  . Bee Venom Swelling  . Metformin And Related Nausea Only    Social History   Social History  . Marital status: Significant Other    Spouse name: engaged  . Number of children: 8  . Years of education: college-2   Occupational History  . Sales   . [employer]    Social History Main Topics  . Smoking status: Former Smoker    Types: Cigarettes    Quit date: 05/04/1996  . Smokeless tobacco: Never Used     Comment: Smoked for 20 years  . Alcohol use Yes      Comment: 1-2 glasses of wine every other day  . Drug use: No  . Sexual activity: No   Other Topics Concern  . Not on file   Social History Narrative  . No narrative on file     Family History  Problem Relation Age of Onset  . Diabetes Mother   . Heart failure Mother      Review of Systems: All other systems reviewed and are otherwise negative except as noted above.  Labs:  Recent Labs  05/04/16 2256 05/05/16 0401  TROPONINI <0.03 <0.03   Lab Results  Component Value Date   WBC 11.6 (H) 05/05/2016   HGB 7.5 (L) 05/05/2016   HCT 23.8 (L) 05/05/2016   MCV 117.8 (H) 05/05/2016   PLT 455 (H) 05/05/2016    Recent Labs Lab 05/05/16 0401  NA 139  K 4.3  CL 109  CO2 26  BUN 11  CREATININE 0.53*  CALCIUM 8.5*  PROT 5.6*  BILITOT 0.7  ALKPHOS 63  ALT 8*  AST 9*  GLUCOSE 192*   Lab Results  Component Value Date   CHOL 111 05/05/2016   HDL 27 (L) 05/05/2016   LDLCALC 63 05/05/2016   TRIG 106 05/05/2016   No results found for: DDIMER  Radiology/Studies:  Dg Chest 2 View  Result Date: 05/04/2016 CLINICAL DATA:  75 year old with acute onset of left-sided chest pain which began earlier today. Patient states an approximate 10 day history of cough. EXAM: CHEST  2 VIEW COMPARISON:  12/21/2013, 06/28/2013. FINDINGS: Cardiac silhouette normal in size, unchanged. Thoracic aorta atherosclerotic, unchanged. Hilar and mediastinal contours otherwise unremarkable. Lungs clear. Bronchovascular markings normal. Pulmonary vascularity normal. No visible pleural effusions. No pneumothorax. Degenerative changes involving the thoracic spine and osseous demineralization. IMPRESSION: 1.  No acute cardiopulmonary disease. 2. Thoracic aortic atherosclerosis. Electronically Signed   By: Evangeline Dakin M.D.   On: 05/04/2016 17:07    Wt Readings from Last 3 Encounters:  05/05/16 162 lb 6.4 oz (73.7 kg)  02/14/16 166 lb (75.3 kg)  09/20/15 161 lb (73 kg)    EKG: NSR 96bpm, no acute  changes  Physical Exam: Blood pressure (!) 107/56, pulse 83, temperature 98.4 F (36.9 C), temperature source Oral, resp. rate 16, height '5\' 10"'$  (1.778 m), weight 162 lb 6.4 oz (73.7 kg), SpO2 97 %. Body mass index is 23.3 kg/m. General: Well developed, well nourished WM, in no acute distress. Head: Normocephalic, atraumatic, sclera non-icteric, no xanthomas, nares are without discharge.  Neck: Negative for carotid bruits. JVD not elevated. Lungs: Clear bilaterally to auscultation without wheezes, rales, or rhonchi. Breathing is unlabored. Heart: RRR with S1 S2. No murmurs,  rubs, or gallops appreciated. Abdomen: Soft, non-tender, non-distended with normoactive bowel sounds. No hepatomegaly. No rebound/guarding. No obvious abdominal masses. Msk:  Strength and tone appear normal for age. Extremities: No clubbing or cyanosis. No edema.  Distal pedal pulses are 2+ and equal bilaterally. Neuro: Alert and oriented X 3. No facial asymmetry. No focal deficit. Moves all extremities spontaneously. Psych:  Responds to questions appropriately with a normal affect.     Assessment and Plan   1. Acute on chronic anemia - per IM. Would recommend consideration of hematology consult as inpatient. He states he was told in the past he did not require further w/u of this. Receiving 2 U PRBCs.  2. Chest pain - grossly atypical. EKG unremarkable and troponins neg x 3 thus far despite >4 hours of constant pain. I will review with MD but suspect it's most appropriate for his #1 issue to be evaluated first, and then consider f/u as outpatient and stress testing if he has recurrent sx.  SignedCharlie Pitter PA-C 05/05/2016, 8:41 AM Pager: [phone]

## 2016-05-05 NOTE — Care Management Obs Status (Signed)
[place] NOTIFICATION   Patient Details  Name: Tommy Peterson MRN: [id number] Date of Birth: 1941-02-26   Medicare Observation Status Notification Given:  Yes    Bethena Roys, RN 05/05/2016, 12:37 PM

## 2016-05-05 NOTE — Progress Notes (Signed)
PROGRESS NOTE    Tommy Peterson  [id number] DOB: November 17, 1940 DOA: 05/04/2016 PCP: Janett Billow, MD    Brief Narrative: Tommy Peterson  is a 75 y.o. male, w Dm2 started to have cp left sided at about 11:30 this am.  Had pain for about 3 hours, and went to urgent Care at [facility] and was sent to ER for evaluation.  Pt denies radiation of the pain to neck or left arm.  Denies fever, chills, cough , dyspnea, gerd, n/v, diarrhea, brbpr, black stool.  Slg nitro en route dropped bp, and didn't relieve his cp.  In ED,  Pt was was seen trop negative. EKG nsr at 95, nl axis, nl in, poor R progression, no st-t changes c/w ischemia.  CXR => no acute process.  Pt will be admitted for cp rule out.    Assessment & Plan:   Active Problems:   Diabetes ([place])   Absolute anemia   Chest pain   1. Cp Trop I q6h x3 negative  echo NPO after midnite LDL 63. Stop lipitor.  Will transfuse 2 units.  Cancel stress test. 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  h at 7, will transfuse 2 units. He report dyspnea, weakness on exertion.  He needs to follow up with his hematology    Mgus, macrocytic anemia Pt can follow up his hematologist     DVT prophylaxis: lovenox Code Status: full code.  Family Communication: care discussed with patient.  Disposition Plan:  Home in 24 hours   Consultants:   Cardiology    Procedures: none   Antimicrobials: (none   Subjective: Denies chest pain. Report weakness and dyspnea on exertion   Objective: Vitals:   05/04/16 2045 05/04/16 2130 05/04/16 2214 05/05/16 0446  BP: 131/65 129/67 (!) 142/70 (!) 107/56  Pulse: 79 93 85 83  Resp: 17 18 18 16   Temp:   97.8 F (36.6 C) 98.4 F (36.9 C)  TempSrc:   Oral Oral  SpO2: 95% 95% 99% 97%  Weight:   73.7 kg (162 lb 6.4 oz) 73.7 kg (162 lb 6.4 oz)  Height:   5\' 10"  (1.778 m)     Intake/Output Summary (Last  24 hours) at 05/05/16 0734 Last data filed at 05/05/16 0447  Gross per 24 hour  Intake              743 ml  Output              800 ml  Net              -57 ml   Filed Weights   05/04/16 1617 05/04/16 2214 05/05/16 0446  Weight: 71.2 kg (157 lb) 73.7 kg (162 lb 6.4 oz) 73.7 kg (162 lb 6.4 oz)    Examination:  General exam: Appears calm and comfortable  Respiratory system: Clear to auscultation. Respiratory effort normal. Cardiovascular system: S1 & S2 heard, RRR. No JVD, murmurs, rubs, gallops or clicks. No pedal edema. Gastrointestinal system: Abdomen is nondistended, soft and nontender. No organomegaly or masses felt. Normal bowel sounds heard. Central nervous system: Alert and oriented. No focal neurological deficits. Extremities: Symmetric 5 x 5 power. Skin: No rashes, lesions or ulcers Psychiatry: Judgement and insight appear normal. Mood & affect appropriate.     Data Reviewed: I have personally reviewed following labs and imaging studies  CBC:  Recent Labs Lab 05/04/16  1621 05/05/16 0401  WBC 10.6* 11.6*  HGB 7.2* 7.5*  HCT 23.0* 23.8*  MCV 117.3* 117.8*  PLT 481* [id number]*   Basic Metabolic Panel:  Recent Labs Lab 05/04/16 1621 05/05/16 0401  NA 137 139  K 3.8 4.3  CL 103 109  CO2 25 26  GLUCOSE 268* 192*  BUN 13 11  CREATININE 0.59* 0.53*  CALCIUM 8.5* 8.5*   GFR: Estimated Creatinine Clearance: 81.1 mL/min (by C-G formula based on SCr of 0.53 mg/dL (L)). Liver Function Tests:  Recent Labs Lab 05/05/16 0401  AST 9*  ALT 8*  ALKPHOS 63  BILITOT 0.7  PROT 5.6*  ALBUMIN 2.9*   No results for input(s): LIPASE, AMYLASE in the last 168 hours. No results for input(s): AMMONIA in the last 168 hours. Coagulation Profile:  Recent Labs Lab 05/04/16 2256  INR 1.19   Cardiac Enzymes:  Recent Labs Lab 05/04/16 2256 05/05/16 0401  TROPONINI <0.03 <0.03   BNP (last 3 results) No results for input(s): PROBNP in the last 8760 hours. HbA1C: No  results for input(s): HGBA1C in the last 72 hours. CBG:  Recent Labs Lab 05/04/16 2211 05/04/16 2350 05/05/16 0443  GLUCAP 147* 312* 181*   Lipid Profile:  Recent Labs  05/05/16 0402  CHOL 111  HDL 27*  LDLCALC 63  TRIG 106  CHOLHDL 4.1   Thyroid Function Tests: No results for input(s): TSH, T4TOTAL, FREET4, T3FREE, THYROIDAB in the last 72 hours. Anemia Panel: No results for input(s): VITAMINB12, FOLATE, FERRITIN, TIBC, IRON, RETICCTPCT in the last 72 hours. Sepsis Labs: No results for input(s): PROCALCITON, LATICACIDVEN in the last 168 hours.  No results found for this or any previous visit (from the past 240 hour(s)).       Radiology Studies: Dg Chest 2 View  Result Date: 05/04/2016 CLINICAL DATA:  75 year old with acute onset of left-sided chest pain which began earlier today. Patient states an approximate 10 day history of cough. EXAM: CHEST  2 VIEW COMPARISON:  12/21/2013, 06/28/2013. FINDINGS: Cardiac silhouette normal in size, unchanged. Thoracic aorta atherosclerotic, unchanged. Hilar and mediastinal contours otherwise unremarkable. Lungs clear. Bronchovascular markings normal. Pulmonary vascularity normal. No visible pleural effusions. No pneumothorax. Degenerative changes involving the thoracic spine and osseous demineralization. IMPRESSION: 1.  No acute cardiopulmonary disease. 2. Thoracic aortic atherosclerosis. Electronically Signed   By: Evangeline Dakin M.D.   On: 05/04/2016 17:07        Scheduled Meds: . sodium chloride   Intravenous Once  . aspirin  81 mg Oral Daily  . atorvastatin  80 mg Oral q1800  . enoxaparin (LOVENOX) injection  40 mg Subcutaneous Q24H  . insulin aspart  0-9 Units Subcutaneous TID WC  . ondansetron (ZOFRAN) IV  4 mg Intravenous Once  . sodium chloride flush  3 mL Intravenous Q12H  . sodium chloride flush  3 mL Intravenous Q12H   Continuous Infusions:    LOS: 0 days    Time spent: 35 minutes.     Elmarie Shiley,  MD [facility] Pager [phone]  If 7PM-7AM, please contact night-coverage [email or web address] Password [facility] 05/05/2016, 7:34 AM

## 2016-05-06 DIAGNOSIS — R079 Chest pain, unspecified: Secondary | ICD-10-CM | POA: Diagnosis not present

## 2016-05-06 LAB — TYPE AND SCREEN
ABO/RH(D): O POS
ANTIBODY SCREEN: NEGATIVE
UNIT DIVISION: 0
Unit division: 0

## 2016-05-06 LAB — CBC
HEMATOCRIT: 31.1 % — AB (ref 39.0–52.0)
Hemoglobin: 10 g/dL — ABNORMAL LOW (ref 13.0–17.0)
MCH: 33.8 pg (ref 26.0–34.0)
MCHC: 32.2 g/dL (ref 30.0–36.0)
MCV: 105.1 fL — ABNORMAL HIGH (ref 78.0–100.0)
Platelets: 452 10*3/uL — ABNORMAL HIGH (ref 150–400)
RBC: 2.96 MIL/uL — ABNORMAL LOW (ref 4.22–5.81)
RDW: 25 % — AB (ref 11.5–15.5)
WBC: 14.8 10*3/uL — ABNORMAL HIGH (ref 4.0–10.5)

## 2016-05-06 LAB — HEMOGLOBIN A1C
HEMOGLOBIN A1C: 8.8 % — AB (ref 4.8–5.6)
MEAN PLASMA GLUCOSE: 206 mg/dL

## 2016-05-06 LAB — GLUCOSE, CAPILLARY
Glucose-Capillary: 129 mg/dL — ABNORMAL HIGH (ref 65–99)
Glucose-Capillary: 134 mg/dL — ABNORMAL HIGH (ref 65–99)

## 2016-05-06 NOTE — Discharge Summary (Signed)
Physician Discharge Summary  Tommy Peterson [id number] DOB: Jun 20, 1941 DOA: 05/04/2016  PCP: Janett Billow, MD  Admit date: 05/04/2016 Discharge date: 05/06/2016  Admitted From: home  Disposition:  Home   Recommendations for Outpatient Follow-up:  1. Follow up with PCP in 1-2 weeks 2. Please obtain BMP/CBC in one week 3. Needs to follow up with primary oncologist, might need repeat BM, and further transfusion.  4. Needs follow up with cardiology outpatient.    Discharge Condition: Stable.  CODE STATUS: full code.  Diet recommendation: Heart Healthy   Brief/Interim Summary: [name] a 75 y.o.male,w Dm2 started to have cp left sided at about 11:30 this am. Had pain for about 3 hours, and went to urgent Care at [facility] and was sent to ER for evaluation. Pt denies radiation of the pain to neck or left arm. Denies fever, chills, cough , dyspnea, gerd, n/v, diarrhea, brbpr, black stool. Slg nitro en route dropped bp, and didn't relieve his cp.  In ED, Pt was was seen trop negative. EKG nsr at 95, nl axis, nl in, poor R progression, no st-t changes c/w ischemia. CXR =>no acute process. Pt will be admitted for cp rule out.    Assessment & Plan:   Active Problems:   Diabetes ([place])   Absolute anemia   Chest pain   1. Cp Trop I q6h x3 negative LDL 63. Stop lipitor.  Chest pain free.  Evaluated by cardio who agree with blood transfusion.  Further cardiology evaluation if pain reoccurs.    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  h at 7,  Transfused  2 units. He report dyspnea, weakness on exertion.  He needs to follow up with his hematology , patient aware.  Hb at 10 post transfusion.   Mgus, macrocytic anemia Pt can follow up his hematologist   Discharge Diagnoses:  Active Problems:   Diabetes ([place])   Absolute anemia   Chest pain    Discharge Instructions  Discharge  Instructions    Diet - low sodium heart healthy    Complete by:  As directed    Increase activity slowly    Complete by:  As directed        Medication List    TAKE these medications   aspirin 81 MG tablet Take 81 mg by mouth daily.   budesonide 3 MG 24 hr capsule Commonly known as:  ENTOCORT EC Take 9 mg by mouth daily as needed (for diarrhea).   LANTUS SOLOSTAR 100 UNIT/ML Solostar Pen Generic drug:  Insulin Glargine Inject 24 Units into the skin at bedtime.   triamcinolone cream 0.1 % Commonly known as:  KENALOG Apply 1 application topically 2 (two) times daily. What changed:  when to take this  reasons to take this      Follow-up Information    Cy Blamer B, MD Follow up in 1 week(s).   Specialty:  Family Medicine Contact information: [address] [place] [address] [place]any [place], MD Follow up in 4 week(s).   Specialty:  Cardiology Contact information: [address] [place] [address] [phone]        Collier Bullock, MD Follow up in 1 week(s).   Specialty:  Hematology and Oncology Contact information: [place] [address] [phone]          Allergies  Allergen  Reactions  . Bee Venom Swelling  . Metformin And Related Nausea Only    Consultations:  Cardiology    Procedures/Studies: Dg Chest 2 View  Result Date: 05/04/2016 CLINICAL DATA:  75 year old with acute onset of left-sided chest pain which began earlier today. Patient states an approximate 10 day history of cough. EXAM: CHEST  2 VIEW COMPARISON:  12/21/2013, 06/28/2013. FINDINGS: Cardiac silhouette normal in size, unchanged. Thoracic aorta atherosclerotic, unchanged. Hilar and mediastinal contours otherwise unremarkable. Lungs clear. Bronchovascular markings normal. Pulmonary vascularity normal. No visible pleural effusions. No pneumothorax. Degenerative changes involving the thoracic  spine and osseous demineralization. IMPRESSION: 1.  No acute cardiopulmonary disease. 2. Thoracic aortic atherosclerosis. Electronically Signed   By: Evangeline Dakin M.D.   On: 05/04/2016 17:07     Subjective:   Discharge Exam: Vitals:   05/06/16 0431 05/06/16 0745  BP: 126/66 119/63  Pulse: 89 95  Resp: 18 17  Temp: 98.4 F (36.9 C) 98.6 F (37 C)   Vitals:   05/05/16 1911 05/05/16 2022 05/06/16 0431 05/06/16 0745  BP: 137/67 (!) 143/62 126/66 119/63  Pulse: 83 89 89 95  Resp: 18 18 18 17   Temp: 98.4 F (36.9 C) 98.3 F (36.8 C) 98.4 F (36.9 C) 98.6 F (37 C)  TempSrc: Oral Oral Oral Oral  SpO2: 94% 95% 92%   Weight:   71.4 kg (157 lb 4.8 oz)   Height:        General: Pt is alert, awake, not in acute distress Cardiovascular: RRR, S1/S2 +, no rubs, no gallops Respiratory: CTA bilaterally, no wheezing, no rhonchi Abdominal: Soft, NT, ND, bowel sounds + Extremities: no edema, no cyanosis    The results of significant diagnostics from this hospitalization (including imaging, microbiology, ancillary and laboratory) are listed below for reference.     Microbiology: No results found for this or any previous visit (from the past 240 hour(s)).   Labs: BNP (last 3 results) No results for input(s): BNP in the last 8760 hours. Basic Metabolic Panel:  Recent Labs Lab 05/04/16 1621 05/05/16 0401  NA 137 139  K 3.8 4.3  CL 103 109  CO2 25 26  GLUCOSE 268* 192*  BUN 13 11  CREATININE 0.59* 0.53*  CALCIUM 8.5* 8.5*   Liver Function Tests:  Recent Labs Lab 05/05/16 0401  AST 9*  ALT 8*  ALKPHOS 63  BILITOT 0.7  PROT 5.6*  ALBUMIN 2.9*   No results for input(s): LIPASE, AMYLASE in the last 168 hours. No results for input(s): AMMONIA in the last 168 hours. CBC:  Recent Labs Lab 05/04/16 1621 05/05/16 0401 05/05/16 2200 05/06/16 0731  WBC 10.6* 11.6* 16.2* 14.8*  HGB 7.2* 7.5* 9.9* 10.0*  HCT 23.0* 23.8* 9.9* 31.1*  MCV 117.3* 117.8* 105.9*  105.1*  PLT 481* 455* 482* 452*   Cardiac Enzymes:  Recent Labs Lab 05/04/16 2256 05/05/16 0401 05/05/16 1034  TROPONINI <0.03 <0.03 <0.03   BNP: Invalid input(s): POCBNP CBG:  Recent Labs Lab 05/05/16 1608 05/05/16 2020 05/05/16 2348 05/06/16 0428 05/06/16 0729  GLUCAP 174* 202* 164* 129* 134*   D-Dimer No results for input(s): DDIMER in the last 72 hours. Hgb A1c  Recent Labs  05/04/16 2256  HGBA1C 8.8*   Lipid Profile  Recent Labs  05/05/16 0402  CHOL 111  HDL 27*  LDLCALC 63  TRIG 106  CHOLHDL 4.1   Thyroid function studies No results for input(s): TSH, T4TOTAL, T3FREE, THYROIDAB in the last 72 hours.  Invalid input(s):  FREET3 Anemia work up No results for input(s): VITAMINB12, FOLATE, FERRITIN, TIBC, IRON, RETICCTPCT in the last 72 hours. Urinalysis    Component Value Date/Time   COLORURINE AMBER (A) 08/01/2015 1801   APPEARANCEUR CLOUDY (A) 08/01/2015 1801   LABSPEC 1.030 08/01/2015 1801   PHURINE 5.5 08/01/2015 1801   GLUCOSEU 250 (A) 08/01/2015 1801   HGBUR NEGATIVE 08/01/2015 1801   BILIRUBINUR SMALL (A) 08/01/2015 1801   KETONESUR 15 (A) 08/01/2015 1801   PROTEINUR 30 (A) 08/01/2015 1801   NITRITE NEGATIVE 08/01/2015 1801   LEUKOCYTESUR TRACE (A) 08/01/2015 1801   Sepsis Labs Invalid input(s): PROCALCITONIN,  WBC,  LACTICIDVEN Microbiology No results found for this or any previous visit (from the past 240 hour(s)).   Time coordinating discharge: Over 30 minutes  SIGNED:   Elmarie Shiley, MD  [facility] 05/06/2016, 9:07 AM Pager   If 7PM-7AM, please contact night-coverage [email or web address] Password [email or web address]

## 2016-05-07 DIAGNOSIS — Z8619 Personal history of other infectious and parasitic diseases: Secondary | ICD-10-CM

## 2016-05-07 HISTORY — DX: Personal history of other infectious and parasitic diseases: Z86.19

## 2016-05-27 ENCOUNTER — Inpatient Hospital Stay (HOSPITAL_COMMUNITY)
Admission: EM | Admit: 2016-05-27 | Discharge: 2016-06-01 | DRG: 871 | Disposition: A | Discharge status: Home / Self Care | Payer: Medicare Other | Attending: Internal Medicine | Admitting: Internal Medicine

## 2016-05-27 ENCOUNTER — Encounter (HOSPITAL_COMMUNITY): Payer: Self-pay

## 2016-05-27 ENCOUNTER — Emergency Department (HOSPITAL_COMMUNITY): Payer: Medicare Other

## 2016-05-27 ENCOUNTER — Inpatient Hospital Stay (HOSPITAL_COMMUNITY): Payer: Medicare Other

## 2016-05-27 DIAGNOSIS — Z794 Long term (current) use of insulin: Secondary | ICD-10-CM

## 2016-05-27 DIAGNOSIS — R410 Disorientation, unspecified: Secondary | ICD-10-CM

## 2016-05-27 DIAGNOSIS — E876 Hypokalemia: Secondary | ICD-10-CM | POA: Diagnosis present

## 2016-05-27 DIAGNOSIS — G9341 Metabolic encephalopathy: Secondary | ICD-10-CM | POA: Diagnosis present

## 2016-05-27 DIAGNOSIS — D649 Anemia, unspecified: Secondary | ICD-10-CM

## 2016-05-27 DIAGNOSIS — D509 Iron deficiency anemia, unspecified: Secondary | ICD-10-CM | POA: Diagnosis not present

## 2016-05-27 DIAGNOSIS — Z7982 Long term (current) use of aspirin: Secondary | ICD-10-CM | POA: Diagnosis not present

## 2016-05-27 DIAGNOSIS — A414 Sepsis due to anaerobes: Principal | ICD-10-CM | POA: Diagnosis present

## 2016-05-27 DIAGNOSIS — Z8249 Family history of ischemic heart disease and other diseases of the circulatory system: Secondary | ICD-10-CM

## 2016-05-27 DIAGNOSIS — Z91030 Bee allergy status: Secondary | ICD-10-CM

## 2016-05-27 DIAGNOSIS — A0472 Enterocolitis due to Clostridium difficile, not specified as recurrent: Secondary | ICD-10-CM | POA: Diagnosis present

## 2016-05-27 DIAGNOSIS — D472 Monoclonal gammopathy: Secondary | ICD-10-CM | POA: Diagnosis present

## 2016-05-27 DIAGNOSIS — Z87891 Personal history of nicotine dependence: Secondary | ICD-10-CM

## 2016-05-27 DIAGNOSIS — E119 Type 2 diabetes mellitus without complications: Secondary | ICD-10-CM | POA: Diagnosis present

## 2016-05-27 DIAGNOSIS — Z960 Presence of urogenital implants: Secondary | ICD-10-CM | POA: Diagnosis present

## 2016-05-27 DIAGNOSIS — Z888 Allergy status to other drugs, medicaments and biological substances status: Secondary | ICD-10-CM

## 2016-05-27 DIAGNOSIS — E872 Acidosis, unspecified: Secondary | ICD-10-CM

## 2016-05-27 DIAGNOSIS — R509 Fever, unspecified: Secondary | ICD-10-CM | POA: Diagnosis not present

## 2016-05-27 DIAGNOSIS — R0602 Shortness of breath: Secondary | ICD-10-CM

## 2016-05-27 DIAGNOSIS — E785 Hyperlipidemia, unspecified: Secondary | ICD-10-CM | POA: Diagnosis present

## 2016-05-27 DIAGNOSIS — Z833 Family history of diabetes mellitus: Secondary | ICD-10-CM

## 2016-05-27 DIAGNOSIS — N400 Enlarged prostate without lower urinary tract symptoms: Secondary | ICD-10-CM | POA: Diagnosis present

## 2016-05-27 DIAGNOSIS — Z9889 Other specified postprocedural states: Secondary | ICD-10-CM | POA: Diagnosis not present

## 2016-05-27 DIAGNOSIS — A419 Sepsis, unspecified organism: Secondary | ICD-10-CM

## 2016-05-27 DIAGNOSIS — Z79899 Other long term (current) drug therapy: Secondary | ICD-10-CM

## 2016-05-27 DIAGNOSIS — D638 Anemia in other chronic diseases classified elsewhere: Secondary | ICD-10-CM | POA: Diagnosis present

## 2016-05-27 LAB — URINALYSIS, ROUTINE W REFLEX MICROSCOPIC
BILIRUBIN URINE: NEGATIVE
Glucose, UA: >1000 mg/dL — AB
Hgb urine dipstick: NEGATIVE
KETONES UR: 15 mg/dL — AB
NITRITE: NEGATIVE
PROTEIN: NEGATIVE mg/dL
Specific Gravity, Urine: 1.027 (ref 1.005–1.030)
pH: 5.5 (ref 5.0–8.0)

## 2016-05-27 LAB — COMPREHENSIVE METABOLIC PANEL
ALT: 8 U/L — ABNORMAL LOW (ref 17–63)
AST: 12 U/L — AB (ref 15–41)
Albumin: 2.7 g/dL — ABNORMAL LOW (ref 3.5–5.0)
Alkaline Phosphatase: 54 U/L (ref 38–126)
Anion gap: 5 (ref 5–15)
BILIRUBIN TOTAL: 0.8 mg/dL (ref 0.3–1.2)
BUN: 7 mg/dL (ref 6–20)
CHLORIDE: 106 mmol/L (ref 101–111)
CO2: 21 mmol/L — ABNORMAL LOW (ref 22–32)
CREATININE: 0.51 mg/dL — AB (ref 0.61–1.24)
Calcium: 7.6 mg/dL — ABNORMAL LOW (ref 8.9–10.3)
Glucose, Bld: 232 mg/dL — ABNORMAL HIGH (ref 65–99)
POTASSIUM: 3.8 mmol/L (ref 3.5–5.1)
Sodium: 132 mmol/L — ABNORMAL LOW (ref 135–145)
TOTAL PROTEIN: 5.1 g/dL — AB (ref 6.5–8.1)

## 2016-05-27 LAB — ETHANOL

## 2016-05-27 LAB — RETICULOCYTES
RBC.: 2.15 MIL/uL — ABNORMAL LOW (ref 4.22–5.81)
RETIC CT PCT: 2.5 % (ref 0.4–3.1)
Retic Count, Absolute: 53.8 10*3/uL (ref 19.0–186.0)

## 2016-05-27 LAB — CSF CELL COUNT WITH DIFFERENTIAL
RBC COUNT CSF: 1 /mm3 — AB
RBC COUNT CSF: 33 /mm3 — AB
Tube #: 1
Tube #: 4
WBC CSF: 1 /mm3 (ref 0–5)
WBC CSF: 3 /mm3 (ref 0–5)

## 2016-05-27 LAB — URINE MICROSCOPIC-ADD ON

## 2016-05-27 LAB — FERRITIN: Ferritin: 256 ng/mL (ref 24–336)

## 2016-05-27 LAB — IRON AND TIBC
Iron: 22 ug/dL — ABNORMAL LOW (ref 45–182)
Saturation Ratios: 10 % — ABNORMAL LOW (ref 17.9–39.5)
TIBC: 231 ug/dL — AB (ref 250–450)
UIBC: 209 ug/dL

## 2016-05-27 LAB — GLUCOSE, CSF: Glucose, CSF: 123 mg/dL — ABNORMAL HIGH (ref 40–70)

## 2016-05-27 LAB — I-STAT CG4 LACTIC ACID, ED
LACTIC ACID, VENOUS: 1.77 mmol/L (ref 0.5–1.9)
Lactic Acid, Venous: 3.25 mmol/L (ref 0.5–1.9)

## 2016-05-27 LAB — VITAMIN B12: VITAMIN B 12: 1287 pg/mL — AB (ref 180–914)

## 2016-05-27 LAB — FOLATE: Folate: 18.2 ng/mL (ref >5.9)

## 2016-05-27 LAB — PROTEIN, CSF: TOTAL PROTEIN, CSF: 37 mg/dL (ref 15–45)

## 2016-05-27 IMAGING — CR DG CHEST 1V PORT
1 series · 1 of 1 positions shown · non-contrast
Comparison: [DATE]

CLINICAL DATA: Fever, altered mental status

EXAM:
PORTABLE CHEST 1 VIEW

[AP]
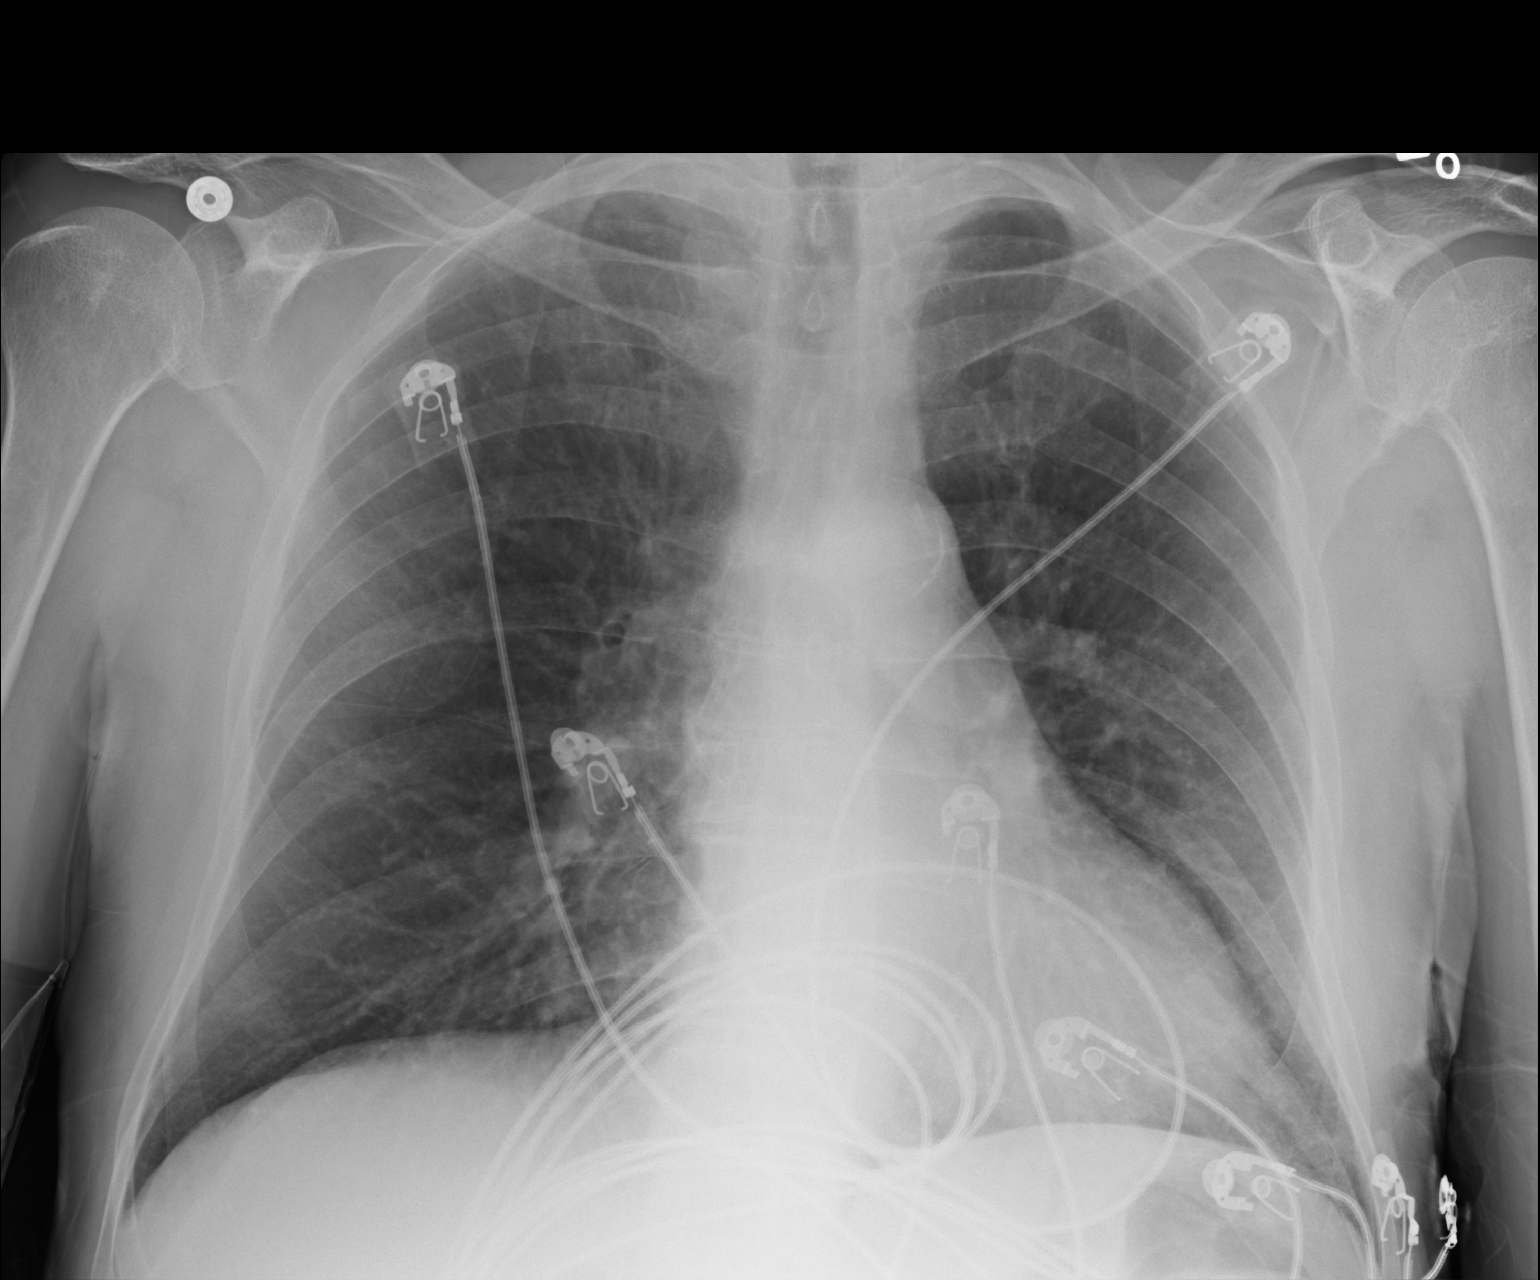

[1 of 1 positions shown; findings below may reference images not displayed]

FINDINGS: The heart size and mediastinal contours are within normal limits.
Both lungs are clear. Atherosclerotic calcifications of thoracic
aorta. Degenerative changes thoracic spine.
IMPRESSION: No active disease.

## 2016-05-27 IMAGING — CR DG CHEST 1V PORT
1 series · 1 of 1 positions shown · non-contrast
Comparison: Chest radiograph [DATE]

CLINICAL DATA: Sepsis. History of diabetes and monoclonal
gammopathy.

EXAM:
PORTABLE CHEST 1 VIEW

[AP]
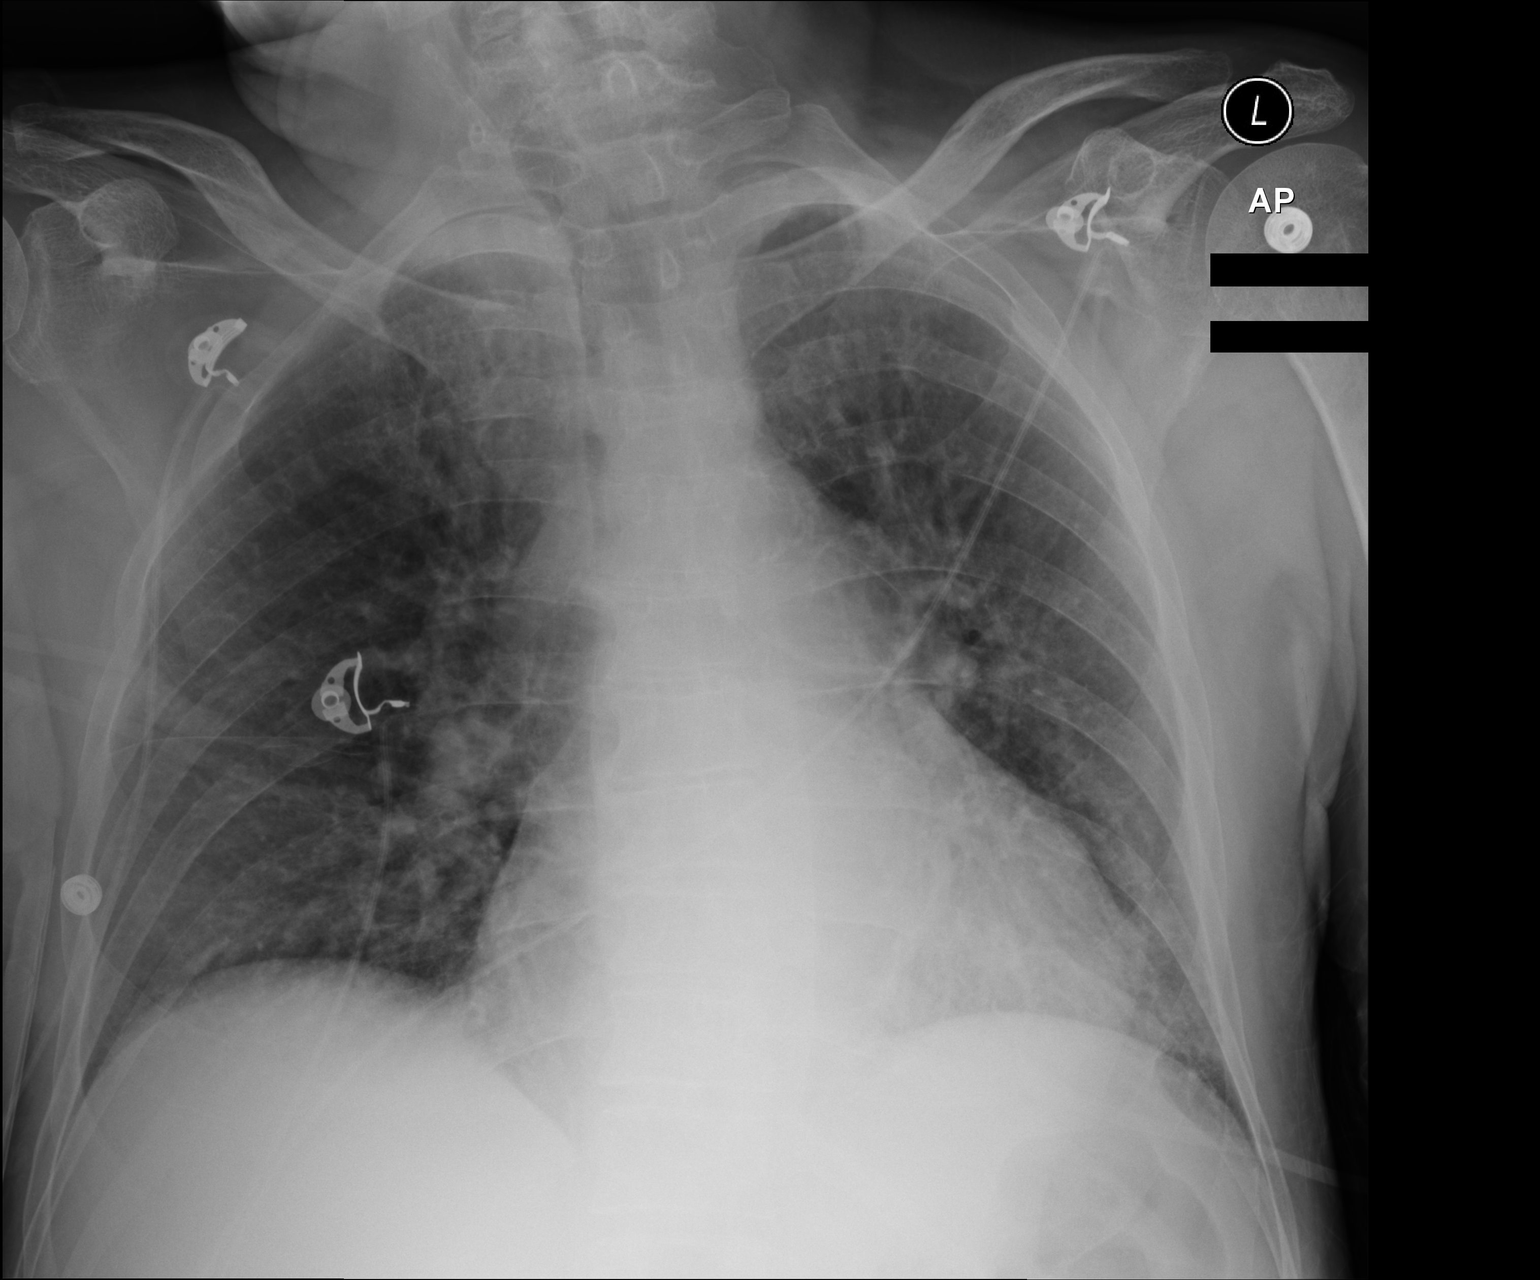

[1 of 1 positions shown; findings below may reference images not displayed]

FINDINGS: Cardiac silhouette is mildly enlarged. Moderate bronchitic change
without pleural effusion or focal consolidation. Biapical pleural
thickening. No pneumothorax. Patient rotated to the RIGHT. Osseous
structures are nonsuspicious.
IMPRESSION: Mild cardiomegaly.  Bronchitic changes without focal consolidation.

## 2016-05-27 MED ORDER — ACETAMINOPHEN 325 MG PO TABS
650.0000 mg | ORAL_TABLET | Freq: Four times a day (QID) | ORAL | Status: DC | PRN
  Administered 2016-05-28 – 2016-06-01 (×4): 650 mg via ORAL
  Filled 2016-05-27 (×4): qty 2

## 2016-05-27 MED ORDER — SODIUM CHLORIDE 0.9 % IV BOLUS (SEPSIS)
1000.0000 mL | Freq: Once | INTRAVENOUS | Status: AC
  Administered 2016-05-27: 1000 mL via INTRAVENOUS

## 2016-05-27 MED ORDER — DEXTROSE 5 % IV SOLN
1.0000 g | Freq: Once | INTRAVENOUS | Status: AC
  Administered 2016-05-27: 1 g via INTRAVENOUS
  Filled 2016-05-27: qty 10

## 2016-05-27 MED ORDER — PIPERACILLIN-TAZOBACTAM 3.375 G IVPB
3.3750 g | Freq: Three times a day (TID) | INTRAVENOUS | Status: DC
  Administered 2016-05-28 – 2016-05-29 (×5): 3.375 g via INTRAVENOUS
  Filled 2016-05-27 (×7): qty 50

## 2016-05-27 MED ORDER — SODIUM CHLORIDE 0.9 % IV SOLN
1250.0000 mg | Freq: Once | INTRAVENOUS | Status: AC
  Administered 2016-05-27: 1250 mg via INTRAVENOUS
  Filled 2016-05-27: qty 1250

## 2016-05-27 MED ORDER — LORAZEPAM 2 MG/ML IJ SOLN
1.0000 mg | Freq: Once | INTRAMUSCULAR | Status: AC
  Administered 2016-05-27: 1 mg via INTRAVENOUS
  Filled 2016-05-27: qty 1

## 2016-05-27 MED ORDER — ACETAMINOPHEN 325 MG PO TABS
650.0000 mg | ORAL_TABLET | Freq: Once | ORAL | Status: AC
  Administered 2016-05-27: 650 mg via ORAL
  Filled 2016-05-27: qty 2

## 2016-05-27 MED ORDER — VANCOMYCIN HCL IN DEXTROSE 1-5 GM/200ML-% IV SOLN: 1000.0000 mg | Freq: Once | INTRAVENOUS | Status: DC

## 2016-05-27 MED ORDER — SODIUM CHLORIDE 0.9 % IV BOLUS (SEPSIS)
250.0000 mL | Freq: Once | INTRAVENOUS | Status: AC
  Administered 2016-05-27: 250 mL via INTRAVENOUS

## 2016-05-27 MED ORDER — SODIUM CHLORIDE 0.9 % IV SOLN
INTRAVENOUS | Status: DC
  Administered 2016-05-28: 100 mL/h via INTRAVENOUS
  Administered 2016-05-28 – 2016-05-29 (×2): via INTRAVENOUS

## 2016-05-27 MED ORDER — ONDANSETRON HCL 4 MG PO TABS: 4.0000 mg | ORAL_TABLET | Freq: Four times a day (QID) | ORAL | Status: DC | PRN

## 2016-05-27 MED ORDER — ACETAMINOPHEN 650 MG RE SUPP: 650.0000 mg | Freq: Four times a day (QID) | RECTAL | Status: DC | PRN

## 2016-05-27 MED ORDER — SODIUM CHLORIDE 0.9% FLUSH
3.0000 mL | Freq: Two times a day (BID) | INTRAVENOUS | Status: DC
  Administered 2016-05-28 (×2): 3 mL via INTRAVENOUS

## 2016-05-27 MED ORDER — VANCOMYCIN HCL IN DEXTROSE 1-5 GM/200ML-% IV SOLN
1000.0000 mg | Freq: Two times a day (BID) | INTRAVENOUS | Status: DC
  Administered 2016-05-28: 1000 mg via INTRAVENOUS
  Filled 2016-05-27 (×2): qty 200

## 2016-05-27 MED ORDER — PIPERACILLIN-TAZOBACTAM 3.375 G IVPB 30 MIN: 3.3750 g | Freq: Once | INTRAVENOUS | Status: DC

## 2016-05-27 MED ORDER — ONDANSETRON HCL 4 MG/2ML IJ SOLN: 4.0000 mg | Freq: Four times a day (QID) | INTRAMUSCULAR | Status: DC | PRN

## 2016-05-27 MED ORDER — LIDOCAINE HCL (PF) 1 % IJ SOLN
10.0000 mL | Freq: Once | INTRAMUSCULAR | Status: AC
  Administered 2016-05-27: 10 mL via INTRADERMAL
  Filled 2016-05-27: qty 10

## 2016-05-27 NOTE — ED Notes (Signed)
RN walked into room to find patient standing at bedside with tele monitor wires still attached; pt stated "Im shitting in my drawers and I pissed in the floor"; RN assisted patient in personal hygiene and placed back in bed; will continue to monitor

## 2016-05-27 NOTE — ED Notes (Signed)
X-Ray at the bedside 

## 2016-05-27 NOTE — ED Provider Notes (Signed)
[place] DEPT Provider Note   CSN: [id number] Arrival date & time: 05/27/16  1638     History   Chief Complaint Chief Complaint  Patient presents with  . Altered Mental Status    HPI Tommy Peterson is a 75 y.o. male.  Patient presents from home with confusion and generalized weakness for the last day, per his girlfriend. Felt warm and was febrile to 102 per EMS, tachycardic and tachypneic. Recently treated for a UTI several days ago on cipro. No other new symptoms, no cough, chest pain, or dyspnea. Also reports he got a bone marrow biopsy this week at [facility] for workup of anemia and leukocytosis, no results back yet. Girlfriend reports slow weight loss.   The history is provided by the patient, the spouse and the EMS personnel.  Altered Mental Status   This is a new problem. The current episode started 12 to 24 hours ago. The problem has not changed since onset.Associated symptoms include confusion and weakness. Pertinent negatives include no somnolence and no unresponsiveness. Associated symptoms comments: Generalized, non-focal weakness. Risk factors include a recent illness (recently diagnosed with UTI, on cipro). His past medical history is significant for hypertension. His past medical history does not include seizures or CVA.    Past Medical History:  Diagnosis Date  . Anemia   . Cataract   . Diabetes mellitus without complication ([employer])   . Macrocytosis   . Monoclonal gammopathy     Patient Active Problem List   Diagnosis Date Noted  . History of bone marrow biopsy 05/27/2016  . Fever 05/27/2016  . Encephalopathy in sepsis 05/27/2016  . Sepsis ([place]) 05/27/2016  . Chest pain 05/04/2016  . Benign fibroma of prostate 09/20/2015  . HLD (hyperlipidemia) 09/20/2015  . BP (high blood pressure) 09/20/2015  . Arthritis, degenerative 09/20/2015  . Colitis, collagenous 09/09/2015  . Absolute anemia 09/08/2014  . Adenoma of large intestine 07/13/2014  . Disturbance  of skin sensation 01/09/2014  . Diabetes ([place]) 06/24/2013  . Recurrent major depressive disorder, in full remission ([place]) 07/18/2012  . Allergic rhinitis 08/29/2011    Past Surgical History:  Procedure Laterality Date  . BONE MARROW BIOPSY Right 2016  . CATARACT EXTRACTION     bilateral  . HERNIA REPAIR     right inguinal  . PENILE PROSTHESIS IMPLANT    . PROSTATE SURGERY     BPH  . TONSILLECTOMY         Home Medications    Prior to Admission medications   Medication Sig Start Date End Date Taking? Authorizing Provider  aspirin 81 MG tablet Take 81 mg by mouth daily.    Historical Provider, MD  budesonide (ENTOCORT EC) 3 MG 24 hr capsule Take 9 mg by mouth daily as needed (for diarrhea).     Historical Provider, MD  LANTUS SOLOSTAR 100 UNIT/ML Solostar Pen Inject 24 Units into the skin at bedtime.  01/05/14   Historical Provider, MD  triamcinolone cream (KENALOG) 0.1 % Apply 1 application topically 2 (two) times daily. Patient taking differently: Apply 1 application topically 2 (two) times daily as needed (for basel cell spots on forehead).  02/14/16   Jaynee Eagles, PA-C    Family History Family History  Problem Relation Age of Onset  . Diabetes Mother   . Heart failure Mother     Social History Social History  Substance Use Topics  . Smoking status: Former Smoker    Types: Cigarettes    Quit date: 05/04/1996  . Smokeless  tobacco: Never Used     Comment: Smoked for 20 years  . Alcohol use Yes     Comment: 1-2 glasses of wine every other day     Allergies   Bee venom and Metformin and related   Review of Systems Review of Systems  Constitutional: Positive for chills, fatigue, fever and unexpected weight change.  HENT: Negative for congestion.   Respiratory: Negative for cough and shortness of breath.   Cardiovascular: Negative for chest pain.  Gastrointestinal: Positive for diarrhea. Negative for abdominal pain and nausea.  Genitourinary: Negative.     Musculoskeletal: Positive for arthralgias.  Skin: Positive for wound.  Allergic/Immunologic: Negative for immunocompromised state.  Neurological: Positive for weakness.  Hematological: Does not bruise/bleed easily.  Psychiatric/Behavioral: Positive for confusion.     Physical Exam Updated Vital Signs BP (!) 123/52   Pulse 105   Temp 98.7 F (37.1 C) (Oral)   Resp 25   Ht 5' 10" (1.778 m)   Wt 71.2 kg   SpO2 93%   BMI 22.53 kg/m   Physical Exam  Constitutional: He is oriented to person, place, and time. He appears well-developed and well-nourished. No distress.  HENT:  Head: Normocephalic and atraumatic.  Eyes: Conjunctivae and EOM are normal. Pupils are equal, round, and reactive to light. Right eye exhibits no discharge. Left eye exhibits no discharge.  Neck: Normal range of motion. Neck supple.  Cardiovascular: Regular rhythm, S1 normal, S2 normal, normal heart sounds and intact distal pulses.  Tachycardia present.   Pulses:      Radial pulses are 2+ on the right side, and 2+ on the left side.  Pulmonary/Chest: Effort normal and breath sounds normal. No respiratory distress. He has no wheezes. He has no rales.  Abdominal: Soft. He exhibits no distension and no mass. There is no tenderness. There is no guarding.  Neurological: He is alert and oriented to person, place, and time.  Skin: Skin is warm and dry. Capillary refill takes less than 2 seconds. No rash noted. He is not diaphoretic. No erythema. No pallor.  Bone marrow biopsy site over right posterior hip without erythema, purulence, or fluctuance. Generalized tenderness.  Psychiatric: He has a normal mood and affect. His behavior is normal. Judgment and thought content normal.     ED Treatments / Results  Labs (all labs ordered are listed, but only abnormal results are displayed) Labs Reviewed  COMPREHENSIVE METABOLIC PANEL - Abnormal; Notable for the following:       Result Value   Sodium 132 (*)    CO2 21 (*)     Glucose, Bld 232 (*)    Creatinine, Ser 0.51 (*)    Calcium 7.6 (*)    Total Protein 5.1 (*)    Albumin 2.7 (*)    AST 12 (*)    ALT 8 (*)    All other components within normal limits  CBC WITH DIFFERENTIAL/PLATELET - Abnormal; Notable for the following:    WBC 28.3 (*)    RBC 1.92 (*)    Hemoglobin 6.6 (*)    HCT 20.4 (*)    MCV 106.3 (*)    MCH 34.4 (*)    RDW 22.7 (*)    Platelets 454 (*)    Neutro Abs 18.7 (*)    Lymphs Abs 6.5 (*)    Monocytes Absolute 3.1 (*)    All other components within normal limits  URINALYSIS, ROUTINE W REFLEX MICROSCOPIC (NOT AT [facility]) - Abnormal; Notable for the following:  Glucose, UA >1000 (*)    Ketones, ur 15 (*)    Leukocytes, UA TRACE (*)    All other components within normal limits  URINE MICROSCOPIC-ADD ON - Abnormal; Notable for the following:    Squamous Epithelial / LPF 0-5 (*)    Bacteria, UA RARE (*)    All other components within normal limits  GLUCOSE, CSF - Abnormal; Notable for the following:    Glucose, CSF 123 (*)    All other components within normal limits  VITAMIN B12 - Abnormal; Notable for the following:    Vitamin B-12 1,287 (*)    All other components within normal limits  IRON AND TIBC - Abnormal; Notable for the following:    Iron 22 (*)    TIBC 231 (*)    Saturation Ratios 10 (*)    All other components within normal limits  RETICULOCYTES - Abnormal; Notable for the following:    RBC. 2.15 (*)    All other components within normal limits  I-STAT CG4 LACTIC ACID, ED - Abnormal; Notable for the following:    Lactic Acid, Venous 3.25 (*)    All other components within normal limits  CULTURE, BLOOD (ROUTINE X 2)  CULTURE, BLOOD (ROUTINE X 2)  URINE CULTURE  C DIFFICILE QUICK SCREEN W PCR REFLEX  CSF CULTURE  PROTEIN, CSF  FOLATE  FERRITIN  ETHANOL  CSF CELL COUNT WITH DIFFERENTIAL  CSF CELL COUNT WITH DIFFERENTIAL  RAPID URINE DRUG SCREEN, HOSP PERFORMED  I-STAT CG4 LACTIC ACID, ED  TYPE AND  SCREEN    EKG  EKG Interpretation None       Radiology Dg Chest Port 1 View  Result Date: 05/27/2016 CLINICAL DATA:  Fever, altered mental status EXAM: PORTABLE CHEST 1 VIEW COMPARISON:  04/26/2016 FINDINGS: The heart size and mediastinal contours are within normal limits. Both lungs are clear. Atherosclerotic calcifications of thoracic aorta. Degenerative changes thoracic spine. IMPRESSION: No active disease. Electronically Signed   By: Lahoma Crocker M.D.   On: 05/27/2016 17:14    Procedures .Lumbar Puncture Date/Time: 05/27/2016 10:44 PM Performed by: Harlin Heys Authorized by: Harlin Heys   Consent:    Consent obtained:  Emergent situation (patient verbally assented but is confused. family members unable to be reached) Pre-procedure details:    Procedure purpose:  Diagnostic Sedation:    Sedation type:  Anxiolysis (1 mg ativan) Anesthesia (see MAR for exact dosages):    Anesthesia method:  Local infiltration   Local anesthetic:  Lidocaine 1% WITH epi Procedure details:    Lumbar space:  L4-L5 interspace   Patient position:  R lateral decubitus   Needle gauge:  20   Needle type:  Spinal needle - Quincke tip   Needle length (in):  2.5   Ultrasound guidance: no     Number of attempts:  2   Fluid appearance:  Blood-tinged then clearing   Tubes of fluid:  4   Total volume (ml):  8 Post-procedure:    Puncture site:  Adhesive bandage applied   Patient tolerance of procedure:  Tolerated well, no immediate complications   (including critical care time)  Medications Ordered in ED Medications  vancomycin (VANCOCIN) 1,250 mg in sodium chloride 0.9 % 250 mL IVPB (1,250 mg Intravenous New Bag/Given 05/27/16 2231)  sodium chloride 0.9 % bolus 1,000 mL (0 mLs Intravenous Stopped 05/27/16 1826)    And  sodium chloride 0.9 % bolus 1,000 mL (0 mLs Intravenous Stopped 05/27/16 1826)    And  sodium chloride 0.9 % bolus 250 mL (0 mLs Intravenous Stopped 05/27/16 1757)    acetaminophen (TYLENOL) tablet 650 mg (650 mg Oral Given 05/27/16 1651)  cefTRIAXone (ROCEPHIN) 1 g in dextrose 5 % 50 mL IVPB (0 g Intravenous Stopped 05/27/16 1826)  lidocaine (PF) (XYLOCAINE) 1 % injection 10 mL (10 mLs Intradermal Given by Other 05/27/16 2120)  sodium chloride 0.9 % bolus 1,000 mL (1,000 mLs Intravenous New Bag/Given 05/27/16 2046)  LORazepam (ATIVAN) injection 1 mg (1 mg Intravenous Given 05/27/16 2120)  cefTRIAXone (ROCEPHIN) 1 g in dextrose 5 % 50 mL IVPB (0 g Intravenous Stopped 05/27/16 2147)  acetaminophen (TYLENOL) tablet 650 mg (650 mg Oral Given 05/27/16 2049)     Initial Impression / Assessment and Plan / ED Course  I have reviewed the triage vital signs and the nursing notes.  Pertinent labs & imaging results that were available during my care of the patient were reviewed by me and considered in my medical decision making (see chart for details).  Clinical Course    Patient presents with one day of mild confusion and generalized weakness. Recently treated for a UTI with several days of Keflex. Also had a bone marrow biopsy at [facility] several days ago as he has been followed for unexplained anemia and leukocytosis. On arrival he is alert and oriented x 4 though is sometimes confused about recent events. He is tachycardic to the low 110s and tachypneic, with normal oxygen saturation and work of breathing. Clear lung sounds and CXR unremarkable. Code sepsis was called and he was given 30 cc/kg of NS for fluid resuscitation. Initial lactate was normal at 1.7. He was initially covered empirically for urosepsis given recent treatment for UTI, and given 1 g of Rocephin. No signs of cellulitis at bone marrow site, no identified skin lesions, no back tenderness to indicate osteomyelitis/diskitis. Discussed with hospitalist and, given confusion and lack of other source of infection, LP performed to evaluate for possible meningitis/encephalitis. Repeat lactate was 3.25 so he  was given an additional fluid bolus and was started on meningitic dose antibiotics with an additional 1g of Rocephin and vancomycin. He will be admitted to hospitalist service for further management of sepsis. He remained normotensive and did not require blood pressure support. Hemoglobin resulted at 6.6 which is consistent with history of unexplained anemia, for which he had a transfusion 3 weeks ago. No history of GI blood losses. Type and cross obtained. He was not transfused emergently given normal blood pressure and tachycardia more likely due to sepsis than acute blood loss anemia.  Final Clinical Impressions(s) / ED Diagnoses   Final diagnoses:  Confusion  Sepsis, due to unspecified organism ([place])  Lactic acidosis  Anemia, unspecified type    New Prescriptions New Prescriptions   No medications on file     Harlin Heys, MD 05/27/16 2257    Elnora Morrison, MD 05/28/16 [phone]

## 2016-05-27 NOTE — ED Triage Notes (Signed)
Per EMS, Pt is coming from home with complaints of fever, altered mental status, and increase fatigue after being diagnosed with a kidney infection three days ago. Pt was placed on antibiotics, and have been compliant. Family reports patient is unable to answer questions like normal.

## 2016-05-27 NOTE — ED Notes (Signed)
Pt given a diet coke. 

## 2016-05-27 NOTE — H&P (Signed)
History and Physical    Tommy Peterson JJK:[id number] DOB: May 07, 1941 DOA: 05/27/2016  PCP: Janett Billow, MD  Patient coming from: home  Chief Complaint:  Fever, confused  HPI: Tommy Peterson is a 75 y.o. male with medical history significant of DM, bone marrow biospy at [facility] 3 days ago for evaluation of leukemia, freq bladder infections recently on cipro brought in by his daughter for high fever and confusion today.  Pt had fever of 103, he seems to be mentating better now.  He says he running fever for 2 days.  No n/v/d.  No pain anywhere.  Some cough no sob.  No urinary symptoms although he is on abx for that a lot.  No rashes.  No neck pain.  BM bx site looks good with no rash.  Pt has clean ua and neg cxr.  Elevated lactate.  No source for infection.  LP done in the ED.  Pt referred for admission for sepsis of unclear source with encephalopathy.  Review of Systems: As per HPI otherwise 10 point review of systems negative.   Past Medical History:  Diagnosis Date  . Anemia   . Cataract   . Diabetes mellitus without complication ([place])   . Macrocytosis   . Monoclonal gammopathy     Past Surgical History:  Procedure Laterality Date  . BONE MARROW BIOPSY Right 2016  . CATARACT EXTRACTION     bilateral  . HERNIA REPAIR     right inguinal  . PENILE PROSTHESIS IMPLANT    . PROSTATE SURGERY     BPH  . TONSILLECTOMY       reports that he quit smoking about 20 years ago. His smoking use included Cigarettes. He has never used smokeless tobacco. He reports that he drinks alcohol. He reports that he does not use drugs.  Allergies  Allergen Reactions  . Bee Venom Swelling  . Metformin And Related Nausea Only    Family History  Problem Relation Age of Onset  . Diabetes Mother   . Heart failure Mother     Prior to Admission medications   Medication Sig Start Date End Date Taking? Authorizing Provider  aspirin 81 MG tablet Take 81 mg by mouth daily.    Historical  Provider, MD  budesonide (ENTOCORT EC) 3 MG 24 hr capsule Take 9 mg by mouth daily as needed (for diarrhea).     Historical Provider, MD  LANTUS SOLOSTAR 100 UNIT/ML Solostar Pen Inject 24 Units into the skin at bedtime.  01/05/14   Historical Provider, MD  triamcinolone cream (KENALOG) 0.1 % Apply 1 application topically 2 (two) times daily. Patient taking differently: Apply 1 application topically 2 (two) times daily as needed (for basel cell spots on forehead).  02/14/16   Jaynee Eagles, PA-C    Physical Exam: Vitals:   05/27/16 2115 05/27/16 2130 05/27/16 2145 05/27/16 2231  BP: (!) 141/49 (!) 120/46 (!) 123/52   Pulse: 111 102 105   Resp: '23 25 25   '$ Temp:    98.7 F (37.1 C)  TempSrc:    Oral  SpO2: 95% 93% 93%   Weight:      Height:          Constitutional: NAD, calm, comfortable Vitals:   05/27/16 2115 05/27/16 2130 05/27/16 2145 05/27/16 2231  BP: (!) 141/49 (!) 120/46 (!) 123/52   Pulse: 111 102 105   Resp: '23 25 25   '$ Temp:    98.7 F (37.1 C)  TempSrc:  Oral  SpO2: 95% 93% 93%   Weight:      Height:       Eyes: PERRL, lids and conjunctivae normal ENMT: Mucous membranes are moist. Posterior pharynx clear of any exudate or lesions.Normal dentition.  Neck: normal, supple, no masses, no thyromegaly Respiratory: clear to auscultation bilaterally, no wheezing, no crackles. Normal respiratory effort. No accessory muscle use.  Cardiovascular: Regular rate and rhythm, no murmurs / rubs / gallops. No extremity edema. 2+ pedal pulses. No carotid bruits.  Abdomen: no tenderness, no masses palpated. No hepatosplenomegaly. Bowel sounds positive.  Musculoskeletal: no clubbing / cyanosis. No joint deformity upper and lower extremities. Good ROM, no contractures. Normal muscle tone.  Skin: no rashes, lesions, ulcers. No induration Neurologic: CN 2-12 grossly intact. Sensation intact, DTR normal. Strength 5/5 in all 4.  Psychiatric: Normal judgment and insight. Alert and oriented x  3. Normal mood.    Labs on Admission: I have personally reviewed following labs and imaging studies  CBC:  Recent Labs Lab 05/27/16 1743  WBC 28.3*  NEUTROABS 18.7*  HGB 6.6*  HCT 20.4*  MCV 106.3*  PLT 478*   Basic Metabolic Panel:  Recent Labs Lab 05/27/16 1743  NA 132*  K 3.8  CL 106  CO2 21*  GLUCOSE 232*  BUN 7  CREATININE 0.51*  CALCIUM 7.6*   GFR: Estimated Creatinine Clearance: 79.1 mL/min (by C-G formula based on SCr of 0.51 mg/dL (L)). Liver Function Tests:  Recent Labs Lab 05/27/16 1743  AST 12*  ALT 8*  ALKPHOS 54  BILITOT 0.8  PROT 5.1*  ALBUMIN 2.7*    Anemia Panel:  Recent Labs  05/27/16 2110  VITAMINB12 1,287*  FOLATE 18.2  FERRITIN 256  TIBC 231*  IRON 22*  RETICCTPCT 2.5   Urine analysis:    Component Value Date/Time   COLORURINE YELLOW 05/27/2016 [place] 05/27/2016 1650   LABSPEC 1.027 05/27/2016 1650   PHURINE 5.5 05/27/2016 1650   GLUCOSEU >1000 (A) 05/27/2016 1650   HGBUR NEGATIVE 05/27/2016 1650   BILIRUBINUR NEGATIVE 05/27/2016 1650   KETONESUR 15 (A) 05/27/2016 1650   PROTEINUR NEGATIVE 05/27/2016 1650   NITRITE NEGATIVE 05/27/2016 1650   LEUKOCYTESUR TRACE (A) 05/27/2016 1650     Radiological Exams on Admission: Dg Chest Port 1 View  Result Date: 05/27/2016 CLINICAL DATA:  Fever, altered mental status EXAM: PORTABLE CHEST 1 VIEW COMPARISON:  04/26/2016 FINDINGS: The heart size and mediastinal contours are within normal limits. Both lungs are clear. Atherosclerotic calcifications of thoracic aorta. Degenerative changes thoracic spine. IMPRESSION: No active disease. Electronically Signed   By: Lahoma Crocker M.D.   On: 05/27/2016 17:14   cxr reviewed no edema or infiltrate Old chart reviewed  Assessment/Plan 75 yo male with acute encephalopathy, fever, sepsis of unclear etiology with h/o freq bladder infections and recent BM biopsy to r/o leukemia at [facility]  Principal Problem:   Encephalopathy  in sepsis-  Cover with zosyn and vanc.  Blood, urine and csf culture done.  Csf gram stain pending.  Ivf, repeat lactic is more elevated at around 3 after 30cc/kg bolus of ivf in the ED.  Repeat fluid bolus repeated by ED, bp is stable.  Check procalitonin levels.  Active Problems:   Diabetes ([place])- noted   HLD (hyperlipidemia)- noted   History of bone marrow biopsy- noted   Fever   Sepsis ([place])   DVT prophylaxis:  scds Code Status:  full Family Communication:  none Disposition Plan:  Per  day team Consults called:  none Admission status:  admit   Anneka Studer A MD [facility]  If 7PM-7AM, please contact night-coverage [email or web address] Password [facility]  05/27/2016, 10:43 PM

## 2016-05-27 NOTE — Progress Notes (Signed)
Pharmacy Antibiotic Note  Tommy Peterson is a 75 y.o. male admitted on 05/27/2016 with fever, confusion.  Pharmacy has been consulted for Vancomycin and Zosyn dosing for sepsis. Recently on Cipro for frequent bladder infections.  Pt received Rocephin 1gm IV ~1740 and Vanc 1250mg  IV in ED ~2230  Plan: Zosyn 3.375gm IV q8h - doses over 4 hours Vancomycin 1gm IV q12h Will f/u micro data, renal function, and pt's clinical condition Vanc trough prn   Height: 5\' 10"  (177.8 cm) Weight: 157 lb (71.2 kg) IBW/kg (Calculated) : 73  Temp (24hrs), Avg:100.5 F (38.1 C), Min:98.6 F (37 C), Max:102.5 F (39.2 C)   Recent Labs Lab 05/27/16 1743 05/27/16 1750 05/27/16 2028  WBC 28.3*  --   --   CREATININE 0.51*  --   --   LATICACIDVEN  --  1.77 3.25*    Estimated Creatinine Clearance: 79.1 mL/min (by C-G formula based on SCr of 0.51 mg/dL (L)).    Allergies  Allergen Reactions  . Bee Venom Swelling  . Metformin And Related Nausea Only    Antimicrobials this admission: 10/21 Rocephin x 1 10/21 Zosyn >>  1021 Vanc >>   Dose adjustments this admission: n/a  Microbiology results: 10/21 BCx x2:  10/21 UCx:   10/21 CSF:  10/21 Cdiff PCR:   Thank you for allowing pharmacy to be a part of this patient's care.  Sherlon Handing, PharmD, BCPS Clinical pharmacist, pager [phone] 05/27/2016 11:32 PM

## 2016-05-27 NOTE — ED Notes (Signed)
Attempted report x 1; name and call back number provided 

## 2016-05-27 NOTE — ED Notes (Signed)
LP CSF specimens collected and walked to main lab by EMT

## 2016-05-27 NOTE — ED Notes (Addendum)
Phlebotomist getting blood at this time. Finished drawing blood cultures before RN started antibiotics

## 2016-05-27 NOTE — Progress Notes (Signed)
Received report from Sherlyn Lick in ED for transfer of pt to [phone].

## 2016-05-28 DIAGNOSIS — Z794 Long term (current) use of insulin: Secondary | ICD-10-CM

## 2016-05-28 DIAGNOSIS — D509 Iron deficiency anemia, unspecified: Secondary | ICD-10-CM

## 2016-05-28 DIAGNOSIS — E119 Type 2 diabetes mellitus without complications: Secondary | ICD-10-CM

## 2016-05-28 LAB — CBC WITH DIFFERENTIAL/PLATELET
BASOS PCT: 0 %
Basophils Absolute: 0 10*3/uL (ref 0.0–0.1)
EOS PCT: 0 %
Eosinophils Absolute: 0 10*3/uL (ref 0.0–0.7)
HCT: 21.1 % — ABNORMAL LOW (ref 39.0–52.0)
HEMOGLOBIN: 7 g/dL — AB (ref 13.0–17.0)
LYMPHS PCT: 18 %
Lymphs Abs: 5.4 10*3/uL — ABNORMAL HIGH (ref 0.7–4.0)
MCH: 36.1 pg — AB (ref 26.0–34.0)
MCHC: 33.2 g/dL (ref 30.0–36.0)
MCV: 108.8 fL — AB (ref 78.0–100.0)
MONO ABS: 3.6 10*3/uL — AB (ref 0.1–1.0)
Monocytes Relative: 12 %
NEUTROS PCT: 70 %
Neutro Abs: 21.2 10*3/uL — ABNORMAL HIGH (ref 1.7–7.7)
Platelets: 390 10*3/uL (ref 150–400)
RBC: 1.94 MIL/uL — ABNORMAL LOW (ref 4.22–5.81)
RDW: 22.9 % — ABNORMAL HIGH (ref 11.5–15.5)
WBC: 30.2 10*3/uL — ABNORMAL HIGH (ref 4.0–10.5)

## 2016-05-28 LAB — LACTIC ACID, PLASMA
LACTIC ACID, VENOUS: 1 mmol/L (ref 0.5–1.9)
LACTIC ACID, VENOUS: 1.3 mmol/L (ref 0.5–1.9)

## 2016-05-28 LAB — RAPID URINE DRUG SCREEN, HOSP PERFORMED
Amphetamines: NOT DETECTED
Barbiturates: NOT DETECTED
Benzodiazepines: NOT DETECTED
Cocaine: NOT DETECTED
OPIATES: NOT DETECTED
TETRAHYDROCANNABINOL: NOT DETECTED

## 2016-05-28 LAB — PROCALCITONIN: Procalcitonin: 0.1 ng/mL

## 2016-05-28 LAB — TYPE AND SCREEN
ABO/RH(D): O POS
ANTIBODY SCREEN: NEGATIVE

## 2016-05-28 LAB — APTT: APTT: 27 s (ref 24–36)

## 2016-05-28 LAB — CBC
HEMATOCRIT: 24 % — AB (ref 39.0–52.0)
Hemoglobin: 7.8 g/dL — ABNORMAL LOW (ref 13.0–17.0)
MCH: 35 pg — AB (ref 26.0–34.0)
MCHC: 32.5 g/dL (ref 30.0–36.0)
MCV: 107.6 fL — ABNORMAL HIGH (ref 78.0–100.0)
Platelets: 448 10*3/uL — ABNORMAL HIGH (ref 150–400)
RBC: 2.23 MIL/uL — ABNORMAL LOW (ref 4.22–5.81)
RDW: 22.9 % — AB (ref 11.5–15.5)
WBC: 36.5 10*3/uL — ABNORMAL HIGH (ref 4.0–10.5)

## 2016-05-28 LAB — BASIC METABOLIC PANEL
Anion gap: 9 (ref 5–15)
BUN: 5 mg/dL — AB (ref 6–20)
CALCIUM: 8 mg/dL — AB (ref 8.9–10.3)
CO2: 21 mmol/L — AB (ref 22–32)
Chloride: 105 mmol/L (ref 101–111)
Creatinine, Ser: 0.54 mg/dL — ABNORMAL LOW (ref 0.61–1.24)
GFR calc Af Amer: >60 mL/min (ref >60)
GLUCOSE: 170 mg/dL — AB (ref 65–99)
Potassium: 3 mmol/L — ABNORMAL LOW (ref 3.5–5.1)
Sodium: 135 mmol/L (ref 135–145)

## 2016-05-28 LAB — PROTIME-INR
INR: 1.36
Prothrombin Time: 16.8 seconds — ABNORMAL HIGH (ref 11.4–15.2)

## 2016-05-28 LAB — URINE CULTURE: Culture: <10000 — AB

## 2016-05-28 LAB — C DIFFICILE QUICK SCREEN W PCR REFLEX
C DIFFICLE (CDIFF) ANTIGEN: POSITIVE — AB
C Diff interpretation: DETECTED
C Diff toxin: POSITIVE — AB

## 2016-05-28 MED ORDER — POTASSIUM CHLORIDE 10 MEQ/100ML IV SOLN
10.0000 meq | INTRAVENOUS | Status: DC
  Filled 2016-05-28: qty 100

## 2016-05-28 MED ORDER — POTASSIUM CHLORIDE 10 MEQ/100ML IV SOLN
10.0000 meq | INTRAVENOUS | Status: AC
  Administered 2016-05-28 (×3): 10 meq via INTRAVENOUS
  Filled 2016-05-28 (×2): qty 100

## 2016-05-28 MED ORDER — DEXTROSE-NACL 5-0.9 % IV SOLN
INTRAVENOUS | Status: DC
  Administered 2016-05-28: 75 mL/h via INTRAVENOUS
  Administered 2016-05-29: 07:00:00 via INTRAVENOUS

## 2016-05-28 MED ORDER — VANCOMYCIN 50 MG/ML ORAL SOLUTION
125.0000 mg | Freq: Four times a day (QID) | ORAL | Status: DC
  Administered 2016-05-28 – 2016-06-01 (×17): 125 mg via ORAL
  Filled 2016-05-28 (×19): qty 2.5

## 2016-05-28 MED ORDER — FERROUS SULFATE 325 (65 FE) MG PO TABS
325.0000 mg | ORAL_TABLET | Freq: Three times a day (TID) | ORAL | Status: DC
  Administered 2016-05-28 – 2016-06-01 (×12): 325 mg via ORAL
  Filled 2016-05-28 (×12): qty 1

## 2016-05-28 NOTE — Progress Notes (Signed)
PROGRESS NOTE [facility]   Vivian Okelley   ZFR:[id number] DOB: 1941-07-01  DOA: 05/27/2016 PCP: Blair Dolphin, MD   Brief Narrative:   Tommy Peterson is a 75 y.o. male with medical history significant of DM, bone marrow biospy at [facility] 3 days ago for evaluation of leukemia, freq bladder infections recently on cipro brought in by his daughter for high fever and confusion. Patient admitted for sepsis and encephalopathy, started on IV abx and IVF.   Subjective: Patient seen and examined this morning at bedside. Patient complaining of diarrhea since yesterday multiple episodes of watery non bloody with abdominal pain. Patient report having multiple courses of antibiotics due to recurrent UTIs. Asking for food. Strong stool smell noted    Assessment & Plan:   Sepsis secondary to C. Difficile pos toxin and antigen - WBC 36.5 -Oral vancomycin -Monitor CBC -IV fluids -Advance diet as tolerated   Anemia - likely to be iron deficiency component increased to TIBC and decreased iron, Hx of bone marrow biopsy ? cell line malignancy  -Start iron supplements -Transfuse if Hb below 7 or symptoms  -Repeat CBC in AM  -Hold anticoagulation   DM 2 - Stable  -SSI -Monitor CBGs -resume Lantus when advanced to full diet   HLD - stable  -Continue home meds  DVT prophylaxis: SCD's Code Status: Full Family Communication: None at bedside Disposition Plan: Anticipated discharge back to previous environment when medically stable    Consultants:   None   Procedures:   None   Antimicrobials:  IV Vanco 10/21  Zosyn 10/21  Oral Vanc 10/22    Objective: Vitals:   05/27/16 2331 05/28/16 0451 05/28/16 0500 05/28/16 0611  BP: 121/64 (!) 135/53    Pulse: 93 (!) 104    Resp: 18 (!) 21    Temp: 98.6 F (37 C) (!) 102.4 F (39.1 C)  98.7 F (37.1 C)  TempSrc: Oral Oral  Oral  SpO2: 99% 91%    Weight: 70 kg (154 lb 6.4 oz)  70 kg (154 lb 5.2 oz)   Height: 5\' 10"  (1.778  m)       Intake/Output Summary (Last 24 hours) at 05/28/16 1234 Last data filed at 05/28/16 1139  Gross per 24 hour  Intake             3165 ml  Output              650 ml  Net             2515 ml   Filed Weights   05/27/16 1642 05/27/16 2331 05/28/16 0500  Weight: 71.2 kg (157 lb) 70 kg (154 lb 6.4 oz) 70 kg (154 lb 5.2 oz)    Examination:  General exam: Appears calm and comfortable - stool smellr Respiratory system: Clear to auscultation. No wheezes,crackle or rhonchi Cardiovascular system: S1 & S2 heard, RRR. No JVD, murmurs, rubs or gallops Gastrointestinal system: Abdomen is soft and tender and mild distended. No organomegaly or masses felt. Central nervous system: Alert and oriented. No focal neurological deficits. Extremities: 1+ pitting pedal edema. Symmetric, strength 5/5   Skin: No rashes Psychiatry: Judgement and insight appear normal. Mood & affect appropriate.    Data Reviewed: I have personally reviewed following labs and imaging studies  CBC:  Recent Labs Lab 05/27/16 1743 05/28/16 0337 05/28/16 1137  WBC 28.3* 36.5* 30.2*  NEUTROABS 18.7*  --  PENDING  HGB 6.6* 7.8* 7.0*  HCT 20.4* 24.0* 21.1*  MCV 106.3*  107.6* 108.8*  PLT 454* 448* 390   Basic Metabolic Panel:  Recent Labs Lab 05/27/16 1743 05/28/16 0337  NA 132* 135  K 3.8 3.0*  CL 106 105  CO2 21* 21*  GLUCOSE 232* 170*  BUN 7 5*  CREATININE 0.51* 0.54*  CALCIUM 7.6* 8.0*   GFR: Estimated Creatinine Clearance: 77.8 mL/min (by C-G formula based on SCr of 0.54 mg/dL (L)). Liver Function Tests:  Recent Labs Lab 05/27/16 1743  AST 12*  ALT 8*  ALKPHOS 54  BILITOT 0.8  PROT 5.1*  ALBUMIN 2.7*   No results for input(s): LIPASE, AMYLASE in the last 168 hours. No results for input(s): AMMONIA in the last 168 hours. Coagulation Profile:  Recent Labs Lab 05/28/16 0038  INR 1.36   Cardiac Enzymes: No results for input(s): CKTOTAL, CKMB, CKMBINDEX, TROPONINI in the last 168  hours. BNP (last 3 results) No results for input(s): PROBNP in the last 8760 hours. HbA1C: No results for input(s): HGBA1C in the last 72 hours. CBG: No results for input(s): GLUCAP in the last 168 hours. Lipid Profile: No results for input(s): CHOL, HDL, LDLCALC, TRIG, CHOLHDL, LDLDIRECT in the last 72 hours. Thyroid Function Tests: No results for input(s): TSH, T4TOTAL, FREET4, T3FREE, THYROIDAB in the last 72 hours. Anemia Panel:  Recent Labs  05/27/16 2110  VITAMINB12 1,287*  FOLATE 18.2  FERRITIN 256  TIBC 231*  IRON 22*  RETICCTPCT 2.5   Sepsis Labs:  Recent Labs Lab 05/27/16 1750 05/27/16 2028 05/28/16 0038 05/28/16 0337  PROCALCITON  --   --  0.10  --   LATICACIDVEN 1.77 3.25* 1.0 1.3    Recent Results (from the past 240 hour(s))  C difficile quick scan w PCR reflex     Status: Abnormal   Collection Time: 05/27/16  7:29 PM  Result Value Ref Range Status   C Diff antigen POSITIVE (A) NEGATIVE Final   C Diff toxin POSITIVE (A) NEGATIVE Final   C Diff interpretation Toxin producing C. difficile detected.  Final    Comment: CRITICAL RESULT CALLED TO, READ BACK BY AND VERIFIED WITH: T.DAYE RN AT 0840 ON 05/28/16 BY A.DAVIS   CSF culture     Status: None (Preliminary result)   Collection Time: 05/27/16  9:40 PM  Result Value Ref Range Status   Specimen Description CSF  Final   Special Requests NONE  Final   Gram Stain CYTOSPIN SMEAR NO WBC SEEN NO ORGANISMS SEEN   Final   Culture NO GROWTH 1 DAY  Final   Report Status PENDING  Incomplete       Radiology Studies: Dg Chest Port 1 View  Result Date: 05/28/2016 CLINICAL DATA:  Sepsis. History of diabetes and monoclonal gammopathy. EXAM: PORTABLE CHEST 1 VIEW COMPARISON:  Chest radiograph May 27, 2016 FINDINGS: Cardiac silhouette is mildly enlarged. Moderate bronchitic change without pleural effusion or focal consolidation. Biapical pleural thickening. No pneumothorax. Patient rotated to the RIGHT.  Osseous structures are nonsuspicious. IMPRESSION: Mild cardiomegaly.  Bronchitic changes without focal consolidation. Electronically Signed   By: Awilda Metro M.D.   On: 05/28/2016 00:18   Dg Chest Port 1 View  Result Date: 05/27/2016 CLINICAL DATA:  Fever, altered mental status EXAM: PORTABLE CHEST 1 VIEW COMPARISON:  04/26/2016 FINDINGS: The heart size and mediastinal contours are within normal limits. Both lungs are clear. Atherosclerotic calcifications of thoracic aorta. Degenerative changes thoracic spine. IMPRESSION: No active disease. Electronically Signed   By: Natasha Mead M.D.   On: 05/27/2016 17:14  Scheduled Meds: . piperacillin-tazobactam (ZOSYN)  IV  3.375 g Intravenous Q8H  . sodium chloride flush  3 mL Intravenous Q12H  . vancomycin  125 mg Oral Q6H   Continuous Infusions: . sodium chloride 100 mL/hr at 05/28/16 0017     LOS: 1 day     Chipper Oman, MD [facility] Pager [phone]  If 7PM-7AM, please contact night-coverage [email or web address] Password [email or web address] 05/28/2016, 12:34 PM

## 2016-05-29 LAB — CBC WITH DIFFERENTIAL/PLATELET
BASOS ABS: 0 10*3/uL (ref 0.0–0.1)
BASOS PCT: 0 %
Basophils Absolute: 0 10*3/uL (ref 0.0–0.1)
Basophils Relative: 0 %
EOS PCT: 0 %
Eosinophils Absolute: 0 10*3/uL (ref 0.0–0.7)
Eosinophils Absolute: 0 10*3/uL (ref 0.0–0.7)
Eosinophils Relative: 0 %
HCT: 20.4 % — ABNORMAL LOW (ref 39.0–52.0)
HCT: 22.3 % — ABNORMAL LOW (ref 39.0–52.0)
HEMOGLOBIN: 7.3 g/dL — AB (ref 13.0–17.0)
Hemoglobin: 6.6 g/dL — CL (ref 13.0–17.0)
LYMPHS ABS: 6.5 10*3/uL — AB (ref 0.7–4.0)
LYMPHS PCT: 30 %
Lymphocytes Relative: 23 %
Lymphs Abs: 5.6 10*3/uL — ABNORMAL HIGH (ref 0.7–4.0)
MCH: 34.4 pg — ABNORMAL HIGH (ref 26.0–34.0)
MCH: 34.9 pg — AB (ref 26.0–34.0)
MCHC: 32.4 g/dL (ref 30.0–36.0)
MCHC: 32.7 g/dL (ref 30.0–36.0)
MCV: 106.3 fL — ABNORMAL HIGH (ref 78.0–100.0)
MCV: 106.7 fL — AB (ref 78.0–100.0)
MONO ABS: 3.1 10*3/uL — AB (ref 0.1–1.0)
MONOS PCT: 11 %
MONOS PCT: 12 %
Monocytes Absolute: 2.2 10*3/uL — ABNORMAL HIGH (ref 0.1–1.0)
NEUTROS PCT: 66 %
NEUTROS PCT: 58 %
Neutro Abs: 18.7 10*3/uL — ABNORMAL HIGH (ref 1.7–7.7)
Neutro Abs: 10.7 10*3/uL — ABNORMAL HIGH (ref 1.7–7.7)
PLATELETS: 454 10*3/uL — AB (ref 150–400)
Platelets: 403 10*3/uL — ABNORMAL HIGH (ref 150–400)
RBC: 1.92 MIL/uL — AB (ref 4.22–5.81)
RBC: 2.09 MIL/uL — AB (ref 4.22–5.81)
RDW: 22.7 % — AB (ref 11.5–15.5)
RDW: 23.3 % — ABNORMAL HIGH (ref 11.5–15.5)
WBC: 28.3 10*3/uL — AB (ref 4.0–10.5)
WBC: 18.5 10*3/uL — ABNORMAL HIGH (ref 4.0–10.5)

## 2016-05-29 LAB — PROCALCITONIN: Procalcitonin: 0.1 ng/mL

## 2016-05-29 LAB — BASIC METABOLIC PANEL
ANION GAP: 4 — AB (ref 5–15)
BUN: <5 mg/dL — ABNORMAL LOW (ref 6–20)
CHLORIDE: 107 mmol/L (ref 101–111)
CO2: 25 mmol/L (ref 22–32)
Calcium: 7.9 mg/dL — ABNORMAL LOW (ref 8.9–10.3)
Creatinine, Ser: 0.47 mg/dL — ABNORMAL LOW (ref 0.61–1.24)
GFR calc non Af Amer: >60 mL/min (ref >60)
Glucose, Bld: 221 mg/dL — ABNORMAL HIGH (ref 65–99)
POTASSIUM: 2.6 mmol/L — AB (ref 3.5–5.1)
Sodium: 136 mmol/L (ref 135–145)

## 2016-05-29 LAB — PATHOLOGIST SMEAR REVIEW

## 2016-05-29 LAB — MAGNESIUM: Magnesium: 2 mg/dL (ref 1.7–2.4)

## 2016-05-29 MED ORDER — POTASSIUM CHLORIDE 10 MEQ/100ML IV SOLN
10.0000 meq | INTRAVENOUS | Status: AC
  Administered 2016-05-29 (×5): 10 meq via INTRAVENOUS
  Filled 2016-05-29 (×5): qty 100

## 2016-05-29 MED ORDER — ACETAMINOPHEN 325 MG PO TABS
325.0000 mg | ORAL_TABLET | ORAL | Status: AC
  Administered 2016-05-29: 325 mg via ORAL
  Filled 2016-05-29: qty 1

## 2016-05-29 NOTE — Progress Notes (Signed)
Temp 101.4 one hour post tylenol 650mg   Administration. On call provider Baltazar Najjar, NP was notified. Will continue to monitor pt

## 2016-05-29 NOTE — Progress Notes (Addendum)
Inpatient Diabetes Program Recommendations  AACE/ADA: New Consensus Statement on Inpatient Glycemic Control (2015)  Target Ranges:  Prepandial:   less than 140 mg/dL      Peak postprandial:   less than 180 mg/dL (1-2 hours)      Critically ill patients:  140 - 180 mg/dL   Lab Results  Component Value Date   GLUCAP 134 (H) 05/06/2016   HGBA1C 8.8 (H) 05/04/2016  Results for Tommy Peterson, Tommy Peterson (MRN [id number]) as of 05/29/2016 15:21  Ref. Range 05/29/2016 05:20  Glucose Latest Ref Range: 65 - 99 mg/dL 221 (H)    Review of Glycemic Control  Diabetes history: DM2 Outpatient Diabetes medications: Lantus 24 units QHS Current orders for Inpatient glycemic control: None  Inpatient Diabetes Program Recommendations: Please consider Novolog correction 0-9 units TIDAC and 0-5 units QHS.  MD text paged.  Thank you,  Windy Carina, RN, BSN Diabetes Coordinator Inpatient Diabetes Program [phone] (Team Pager)

## 2016-05-29 NOTE — Progress Notes (Signed)
PROGRESS NOTE [facility]   Tommy Peterson   BMW:[id number] DOB: Jun 23, 1941  DOA: 05/27/2016 PCP: Janett Billow, MD   Brief Narrative:   Tommy Peterson is a 75 y.o. male with medical history significant of DM, bone marrow biospy at [facility] 3 days ago for evaluation of leukemia, freq bladder infections recently on cipro brought in by his daughter for high fever and confusion. Patient admitted for sepsis and encephalopathy, started on IV abx and IVF.   Subjective: Patient seen and examined this morning at bedside. 2 BM better form, no blood. Feeling much better. Asking to eat solid food   Assessment & Plan:   Sepsis secondary to C. Difficile pos toxin and antigen - WBC 36.5 trending down --> 27.2, Had Metabolic encephalopathy 2/2 sepsis - which has resolved  -Continue Oral vancomycin -Monitor CBC -d/c IV fluids -Advance diet as tolerated  -D/c Zosyn   Anemia - likely to be iron deficiency component increased to TIBC and decreased iron, Hx of bone marrow biopsy ? cell line malignancy  -Start iron supplements -Transfuse if Hb below 7 or symptoms  -Repeat CBC in AM  -Hold anticoagulation   DM 2 - Stable  -SSI -Monitor CBGs -resume Lantus when advanced to full diet   HLD - stable  -Continue home meds  DVT prophylaxis: SCD's Code Status: Full Family Communication: None at bedside Disposition Plan: Anticipated discharge back to previous environment when medically stable    Consultants:   None   Procedures:   None   Antimicrobials:  IV Vanco 10/21  Zosyn 10/21  Oral Vanc 10/22    Objective: Vitals:   05/28/16 1415 05/28/16 2117 05/29/16 0305 05/29/16 0702  BP: (!) 108/48 (!) 111/51  126/71  Pulse: 89 90  84  Resp: _0 Temp: 99.3 F (37.4 C) (!) 100.9 F (38.3 C) 99.3 F (37.4 C) 98.9 F (37.2 C)  TempSrc: Oral Oral Oral Oral  SpO2: 97% 94%  93%  Weight:      Height:        Intake/Output Summary (Last 24 hours) at 05/29/16  0909 Last data filed at 05/29/16 0200  Gross per 24 hour  Intake              735 ml  Output              925 ml  Net             -190 ml   Filed Weights   05/27/16 1642 05/27/16 2331 05/28/16 0500  Weight: 71.2 kg (157 lb) 70 kg (154 lb 6.4 oz) 70 kg (154 lb 5.2 oz)    Examination:  General exam: Appears calm and comfortable Respiratory system: Clear to auscultation. No wheezes,crackle or rhonchi Cardiovascular system: S1 & S2 heard, RRR. No JVD, murmurs, rubs or gallops Gastrointestinal system: Abdomen is soft and tender and mild distended. No organomegaly or masses felt. Central nervous system: Alert and oriented. No focal neurological deficits. Extremities: 1+ pitting pedal edema. Symmetric, strength 5/5   Skin: No rashes Psychiatry: Judgement and insight appear normal. Mood & affect appropriate.    Data Reviewed: I have personally reviewed following labs and imaging studies  CBC:  Recent Labs Lab 05/27/16 1743 05/28/16 0337 05/28/16 1137 05/29/16 0520  WBC 28.3* 36.5* 30.2* 18.5*  NEUTROABS 18.7*  --  21.2* 10.7*  HGB 6.6* 7.8* 7.0* 7.3*  HCT 20.4* 24.0* 21.1* 22.3*  MCV 106.3* 107.6* 108.8* 106.7*  PLT 454* 448* 390  794*   Basic Metabolic Panel:  Recent Labs Lab 05/27/16 1743 05/28/16 0337 05/29/16 0520 05/29/16 0758  NA 132* 135 136  --   K 3.8 3.0* 2.6*  --   CL 106 105 107  --   CO2 21* 21* 25  --   GLUCOSE 232* 170* 221*  --   BUN 7 5* <5*  --   CREATININE 0.51* 0.54* 0.47*  --   CALCIUM 7.6* 8.0* 7.9*  --   MG  --   --   --  2.0   GFR: Estimated Creatinine Clearance: 77.8 mL/min (by C-G formula based on SCr of 0.47 mg/dL (L)). Liver Function Tests:  Recent Labs Lab 05/27/16 1743  AST 12*  ALT 8*  ALKPHOS 54  BILITOT 0.8  PROT 5.1*  ALBUMIN 2.7*   No results for input(s): LIPASE, AMYLASE in the last 168 hours. No results for input(s): AMMONIA in the last 168 hours. Coagulation Profile:  Recent Labs Lab 05/28/16 0038  INR 1.36    Cardiac Enzymes: No results for input(s): CKTOTAL, CKMB, CKMBINDEX, TROPONINI in the last 168 hours. BNP (last 3 results) No results for input(s): PROBNP in the last 8760 hours. HbA1C: No results for input(s): HGBA1C in the last 72 hours. CBG: No results for input(s): GLUCAP in the last 168 hours. Lipid Profile: No results for input(s): CHOL, HDL, LDLCALC, TRIG, CHOLHDL, LDLDIRECT in the last 72 hours. Thyroid Function Tests: No results for input(s): TSH, T4TOTAL, FREET4, T3FREE, THYROIDAB in the last 72 hours. Anemia Panel:  Recent Labs  05/27/16 2110  VITAMINB12 1,287*  FOLATE 18.2  FERRITIN 256  TIBC 231*  IRON 22*  RETICCTPCT 2.5   Sepsis Labs:  Recent Labs Lab 05/27/16 1750 05/27/16 2028 05/28/16 0038 05/28/16 0337 05/29/16 0520  PROCALCITON  --   --  0.10  --  0.10  LATICACIDVEN 1.77 3.25* 1.0 1.3  --     Recent Results (from the past 240 hour(s))  Urine culture     Status: Abnormal   Collection Time: 05/27/16  4:50 PM  Result Value Ref Range Status   Specimen Description URINE, CLEAN CATCH  Final   Special Requests NONE  Final   Culture <10,000 COLONIES/mL INSIGNIFICANT GROWTH (A)  Final   Report Status 05/28/2016 FINAL  Final  Blood Culture (routine x 2)     Status: None (Preliminary result)   Collection Time: 05/27/16  5:34 PM  Result Value Ref Range Status   Specimen Description BLOOD LEFT HAND  Final   Special Requests BOTTLES DRAWN AEROBIC ONLY 1CC  Final   Culture NO GROWTH < 24 HOURS  Final   Report Status PENDING  Incomplete  Blood Culture (routine x 2)     Status: None (Preliminary result)   Collection Time: 05/27/16  5:43 PM  Result Value Ref Range Status   Specimen Description BLOOD RIGHT HAND  Final   Special Requests BOTTLES DRAWN AEROBIC AND ANAEROBIC 5CC  Final   Culture NO GROWTH < 24 HOURS  Final   Report Status PENDING  Incomplete  C difficile quick scan w PCR reflex     Status: Abnormal   Collection Time: 05/27/16  7:29 PM   Result Value Ref Range Status   C Diff antigen POSITIVE (A) NEGATIVE Final   C Diff toxin POSITIVE (A) NEGATIVE Final   C Diff interpretation Toxin producing C. difficile detected.  Final    Comment: CRITICAL RESULT CALLED TO, READ BACK BY AND VERIFIED WITH: T.DAYE RN  AT 0840 ON 05/28/16 BY A.DAVIS   CSF culture     Status: None (Preliminary result)   Collection Time: 05/27/16  9:40 PM  Result Value Ref Range Status   Specimen Description CSF  Final   Special Requests NONE  Final   Gram Stain CYTOSPIN SMEAR NO WBC SEEN NO ORGANISMS SEEN   Final   Culture NO GROWTH 1 DAY  Final   Report Status PENDING  Incomplete      Radiology Studies: Dg Chest Port 1 View  Result Date: 05/28/2016 CLINICAL DATA:  Sepsis. History of diabetes and monoclonal gammopathy. EXAM: PORTABLE CHEST 1 VIEW COMPARISON:  Chest radiograph May 27, 2016 FINDINGS: Cardiac silhouette is mildly enlarged. Moderate bronchitic change without pleural effusion or focal consolidation. Biapical pleural thickening. No pneumothorax. Patient rotated to the RIGHT. Osseous structures are nonsuspicious. IMPRESSION: Mild cardiomegaly.  Bronchitic changes without focal consolidation. Electronically Signed   By: Elon Alas M.D.   On: 05/28/2016 00:18   Dg Chest Port 1 View  Result Date: 05/27/2016 CLINICAL DATA:  Fever, altered mental status EXAM: PORTABLE CHEST 1 VIEW COMPARISON:  04/26/2016 FINDINGS: The heart size and mediastinal contours are within normal limits. Both lungs are clear. Atherosclerotic calcifications of thoracic aorta. Degenerative changes thoracic spine. IMPRESSION: No active disease. Electronically Signed   By: Lahoma Crocker M.D.   On: 05/27/2016 17:14      Scheduled Meds: . ferrous sulfate  325 mg Oral TID WC  . piperacillin-tazobactam (ZOSYN)  IV  3.375 g Intravenous Q8H  . potassium chloride  10 mEq Intravenous Q1 Hr x 5  . sodium chloride flush  3 mL Intravenous Q12H  . vancomycin  125 mg Oral  Q6H   Continuous Infusions: . sodium chloride 100 mL/hr (05/28/16 1535)  . dextrose 5 % and 0.9% NaCl 75 mL/hr at 05/29/16 0709     LOS: 2 days     Chipper Oman, MD [facility] Pager [phone]  If 7PM-7AM, please contact night-coverage [email or web address] Password [email or web address] 05/29/2016, 9:09 AM

## 2016-05-30 ENCOUNTER — Inpatient Hospital Stay (HOSPITAL_COMMUNITY): Payer: Medicare Other

## 2016-05-30 DIAGNOSIS — R0602 Shortness of breath: Secondary | ICD-10-CM

## 2016-05-30 LAB — CBC WITH DIFFERENTIAL/PLATELET
BASOS PCT: 0 %
Basophils Absolute: 0 10*3/uL (ref 0.0–0.1)
EOS PCT: 0 %
Eosinophils Absolute: 0 10*3/uL (ref 0.0–0.7)
HEMATOCRIT: 23.5 % — AB (ref 39.0–52.0)
HEMOGLOBIN: 7.7 g/dL — AB (ref 13.0–17.0)
LYMPHS PCT: 18 %
Lymphs Abs: 4.2 10*3/uL — ABNORMAL HIGH (ref 0.7–4.0)
MCH: 35 pg — ABNORMAL HIGH (ref 26.0–34.0)
MCHC: 32.8 g/dL (ref 30.0–36.0)
MCV: 106.8 fL — AB (ref 78.0–100.0)
MONOS PCT: 12 %
Monocytes Absolute: 2.8 10*3/uL — ABNORMAL HIGH (ref 0.1–1.0)
NEUTROS PCT: 70 %
Neutro Abs: 16.5 10*3/uL — ABNORMAL HIGH (ref 1.7–7.7)
Platelets: 455 10*3/uL — ABNORMAL HIGH (ref 150–400)
RBC: 2.2 MIL/uL — AB (ref 4.22–5.81)
RDW: 22.8 % — ABNORMAL HIGH (ref 11.5–15.5)
WBC: 23.5 10*3/uL — ABNORMAL HIGH (ref 4.0–10.5)

## 2016-05-30 LAB — BASIC METABOLIC PANEL
ANION GAP: 10 (ref 5–15)
BUN: <5 mg/dL — ABNORMAL LOW (ref 6–20)
CO2: 21 mmol/L — AB (ref 22–32)
Calcium: 7.8 mg/dL — ABNORMAL LOW (ref 8.9–10.3)
Chloride: 101 mmol/L (ref 101–111)
Creatinine, Ser: 0.56 mg/dL — ABNORMAL LOW (ref 0.61–1.24)
GFR calc non Af Amer: >60 mL/min (ref >60)
GLUCOSE: 270 mg/dL — AB (ref 65–99)
POTASSIUM: 2.7 mmol/L — AB (ref 3.5–5.1)
Sodium: 132 mmol/L — ABNORMAL LOW (ref 135–145)

## 2016-05-30 LAB — GLUCOSE, CAPILLARY: GLUCOSE-CAPILLARY: 250 mg/dL — AB (ref 65–99)

## 2016-05-30 IMAGING — DX DG CHEST 2V
2 series · 2 of 2 positions shown · non-contrast
Comparison: [DATE]

CLINICAL DATA: Shortness of breath

EXAM:
CHEST  2 VIEW

[w chest pa]
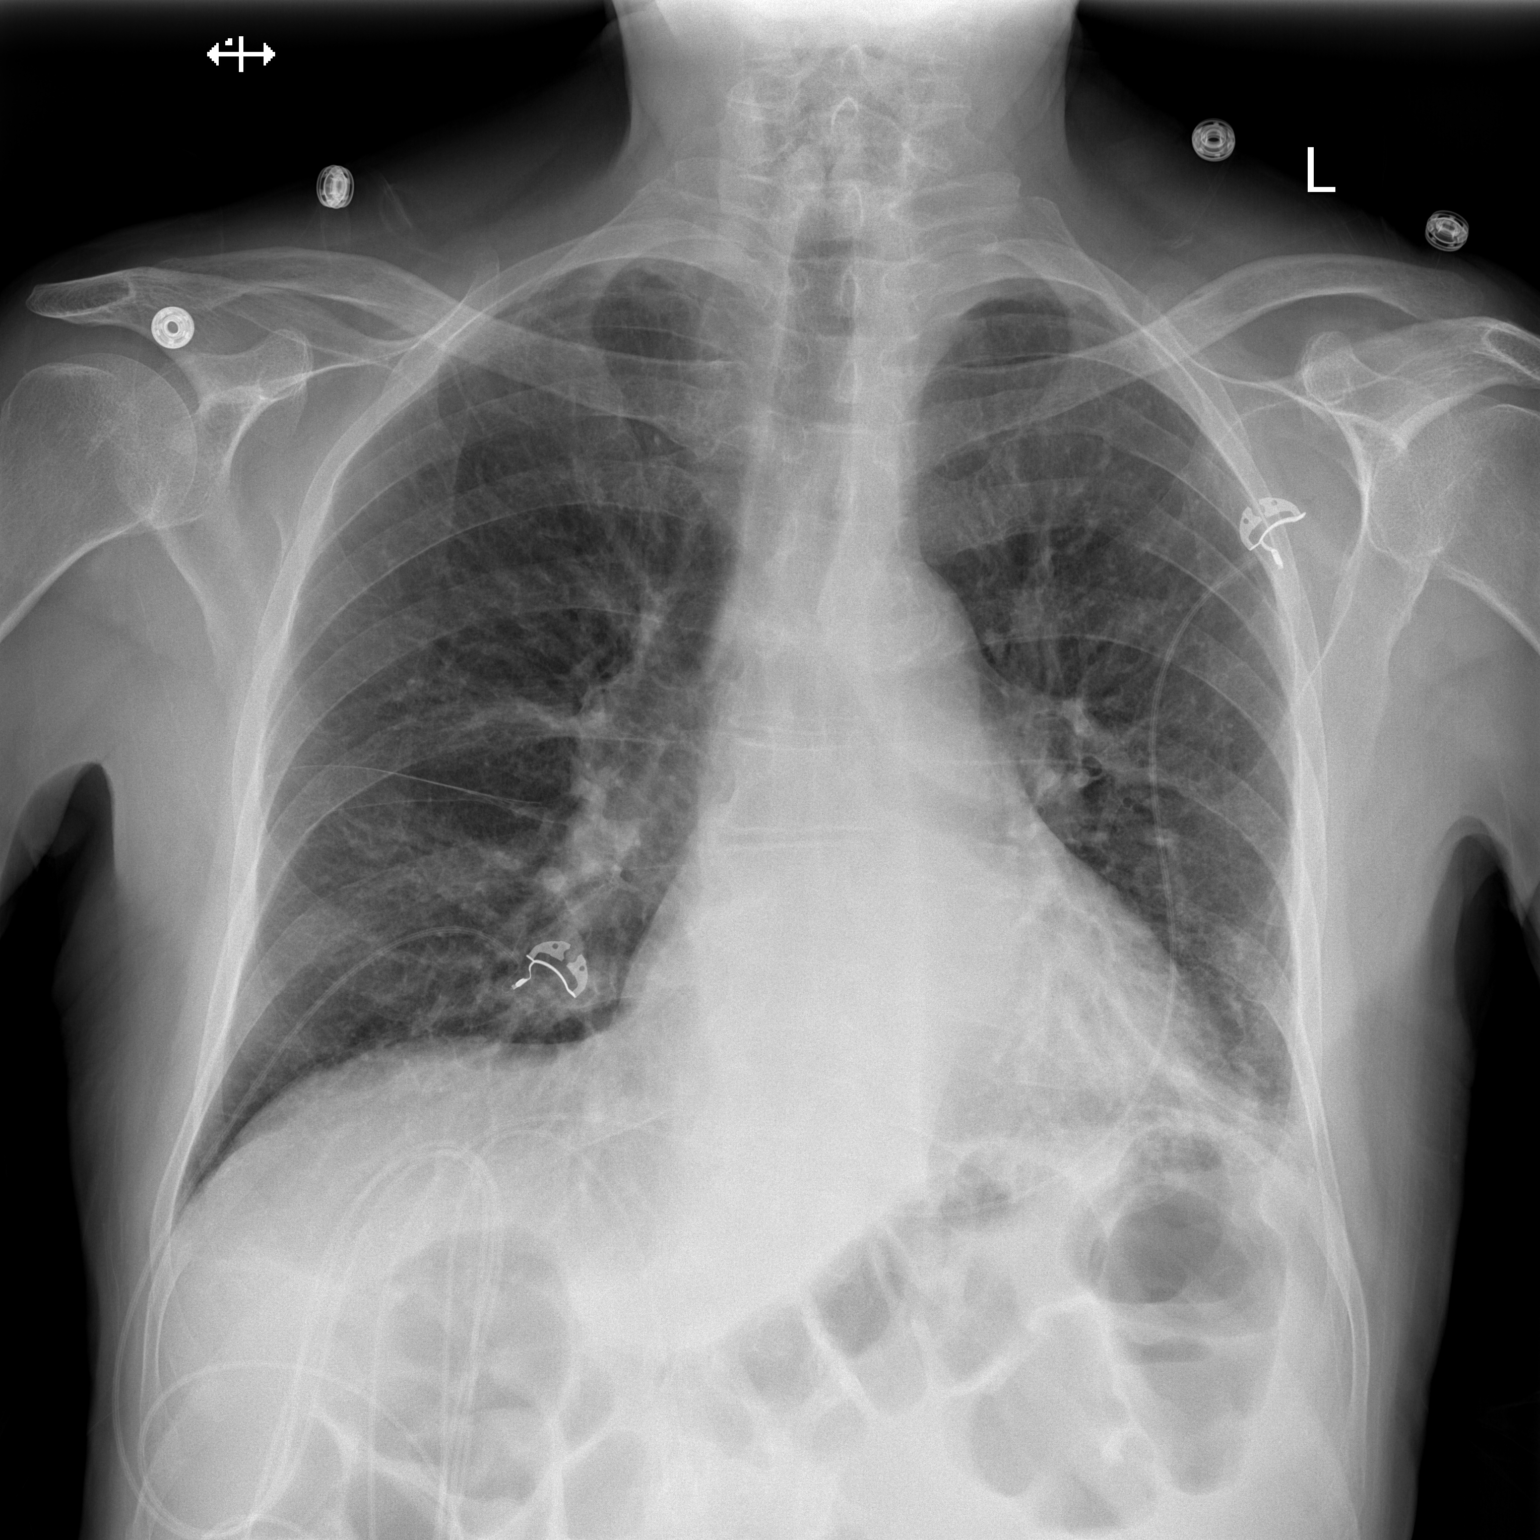

[w chest lat]
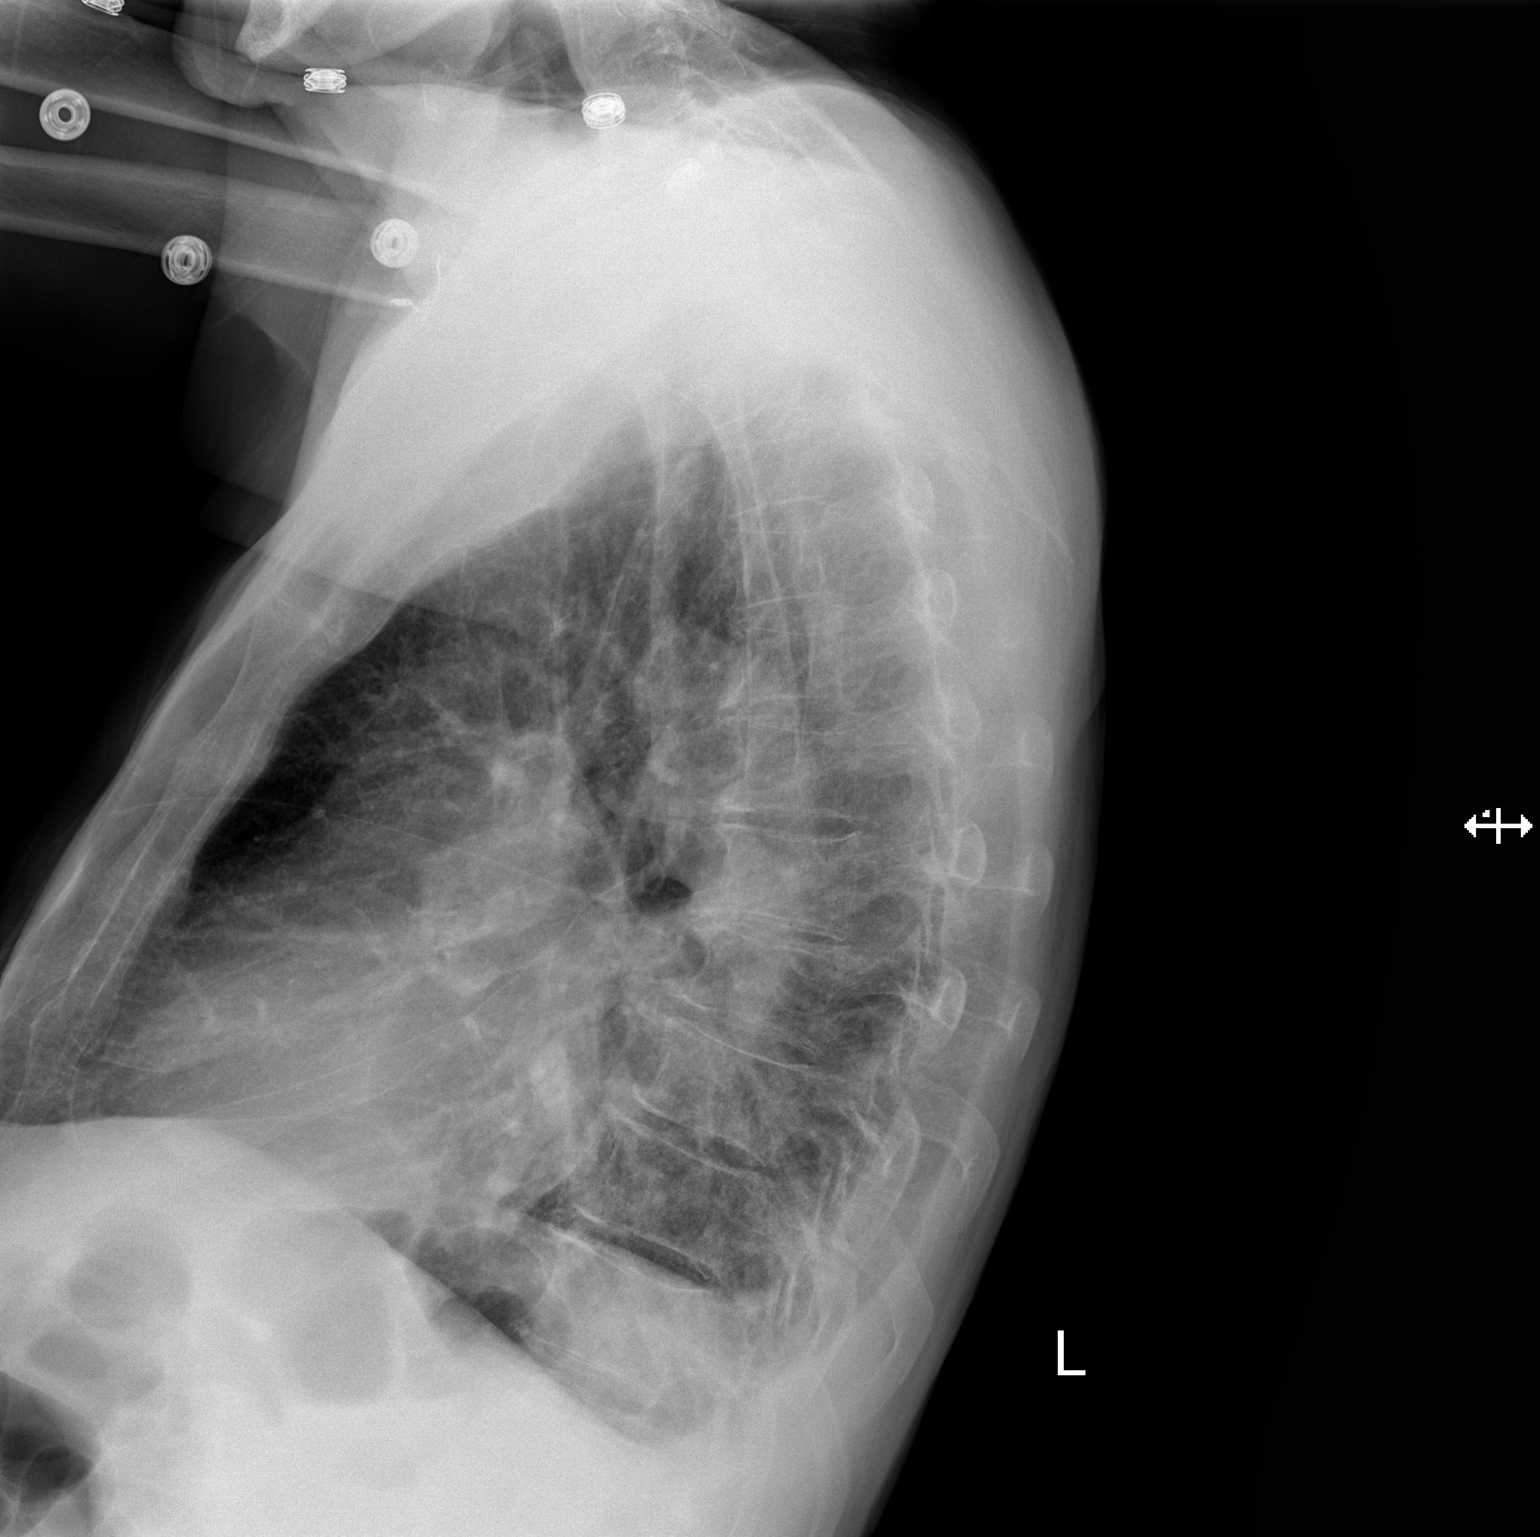

[2 of 2 positions shown; findings below may reference images not displayed]

FINDINGS: Streaky retrocardiac opacity is attributed to scar given stability
since at least [QZ] and mild volume loss. There is no edema,
consolidation, or pneumothorax. Normal heart size and mediastinal
contours. Trace right pleural fluid.
IMPRESSION: 1. Trace right pleural fluid without edema or consolidation.
2. Stable mild retrocardiac scarring.

## 2016-05-30 MED ORDER — POTASSIUM CHLORIDE 10 MEQ/100ML IV SOLN
10.0000 meq | INTRAVENOUS | Status: AC
Start: 2016-05-30 — End: 2016-05-30
  Administered 2016-05-30 (×5): 10 meq via INTRAVENOUS
  Filled 2016-05-30 (×5): qty 100

## 2016-05-30 MED ORDER — POTASSIUM CHLORIDE IN NACL 20-0.45 MEQ/L-% IV SOLN
INTRAVENOUS | Status: DC
  Administered 2016-05-30: 75 mL/h via INTRAVENOUS
  Administered 2016-05-31 – 2016-06-01 (×3): via INTRAVENOUS
  Filled 2016-05-30 (×5): qty 1000

## 2016-05-30 MED ORDER — INSULIN GLARGINE 100 UNIT/ML ~~LOC~~ SOLN
24.0000 [IU] | Freq: Every day | SUBCUTANEOUS | Status: DC
  Administered 2016-05-30 – 2016-05-31 (×3): 24 [IU] via SUBCUTANEOUS
  Filled 2016-05-30 (×4): qty 0.24

## 2016-05-30 MED ORDER — INSULIN ASPART 100 UNIT/ML ~~LOC~~ SOLN
0.0000 [IU] | Freq: Three times a day (TID) | SUBCUTANEOUS | Status: DC
  Administered 2016-05-31 (×2): 1 [IU] via SUBCUTANEOUS
  Administered 2016-05-31: 3 [IU] via SUBCUTANEOUS

## 2016-05-30 MED ORDER — INSULIN ASPART 100 UNIT/ML ~~LOC~~ SOLN: 0.0000 [IU] | Freq: Every day | SUBCUTANEOUS | Status: DC

## 2016-05-30 NOTE — Evaluation (Signed)
Physical Therapy Evaluation Patient Details Name: Tommy Peterson MRN: [id number] DOB: 10-Sep-1940 Today's Date: 05/30/2016   History of Present Illness  pt is a 75 y/o male with pmh of DM and frequent UTI's, admitted with high fever (103) and confusion. Working dx is sepsis due to c-diff toxin.  Clinical Impression  Pt admitted with/for fever found to be due to c-diff.  Pt is weak with balance issues at this time, needing min guard to min assist for mobility.  Pt currently limited functionally due to the problems listed below.  (see problems list.)  Pt will benefit from PT to maximize function and safety to be able to get home safely with available assist of family.     Follow Up Recommendations No PT follow up;Supervision for mobility/OOB (may end up needing HHPT if stays long in the hospital)    Equipment Recommendations  None recommended by PT    Recommendations for Other Services       Precautions / Restrictions Precautions Precautions: Fall      Mobility  Bed Mobility Overal bed mobility: Needs Assistance Bed Mobility: Supine to Sit;Sit to Supine     Supine to sit: Min assist Sit to supine: Supervision   General bed mobility comments: Initially, pt could not maintainsitting EOB and kept slowly leaning, then falling slowly backward  Transfers Overall transfer level: Needs assistance   Transfers: Sit to/from Stand Sit to Stand: Min assist         General transfer comment: stability once up.  Initially, pt using legs against the bed.  Ambulation/Gait Ambulation/Gait assistance: Min guard Ambulation Distance (Feet): 250 Feet Assistive device:  (IV POLE) Gait Pattern/deviations: Step-through pattern   Gait velocity interpretation: Below normal speed for age/gender General Gait Details: mildly unsteady overall, but improving with time up.  Stairs            Wheelchair Mobility    Modified Rankin (Stroke Patients Only)       Balance Overall balance  assessment: Needs assistance Sitting-balance support: Bilateral upper extremity supported Sitting balance-Leahy Scale: Poor Sitting balance - Comments: kept falling back on the bed when coming to EOB     Standing balance-Leahy Scale: Poor Standing balance comment: needed iv pole for stability                             Pertinent Vitals/Pain Pain Assessment: No/denies pain    Home Living Family/patient expects to be discharged to:: Private residence Living Arrangements: Spouse/significant other;Children Available Help at Discharge: Family;Available 24 hours/day (wife is in the hospital presently) Type of Home: House Home Access: Stairs to enter     Home Layout: One level        Prior Function Level of Independence: Independent               Hand Dominance        Extremity/Trunk Assessment   Upper Extremity Assessment: Defer to OT evaluation           Lower Extremity Assessment: Overall WFL for tasks assessed (mild proximal weakness, truncal weakness)      Cervical / Trunk Assessment: Other exceptions (truncal weaknesses)  Communication   Communication: No difficulties  Cognition Arousal/Alertness: Awake/alert Behavior During Therapy: WFL for tasks assessed/performed Overall Cognitive Status: Within Functional Limits for tasks assessed                      General Comments  Exercises     Assessment/Plan    PT Assessment Patient needs continued PT services  PT Problem List Decreased strength;Decreased activity tolerance;Decreased balance;Decreased mobility;Decreased knowledge of use of DME          PT Treatment Interventions Gait training;Functional mobility training;Therapeutic activities;DME instruction;Balance training;Patient/family education    PT Goals (Current goals can be found in the Care Plan section)  Acute Rehab PT Goals Patient Stated Goal: back home free of this infection PT Goal Formulation: With  patient Time For Goal Achievement: 06/13/16 Potential to Achieve Goals: Good    Frequency Min 3X/week   Barriers to discharge        Co-evaluation               End of Session   Activity Tolerance: Patient limited by fatigue;Patient tolerated treatment well Patient left: in bed;with call bell/phone within reach;with bed alarm set Nurse Communication: Mobility status         Time: [id number] PT Time Calculation (min) (ACUTE ONLY): 33 min   Charges:   PT Evaluation $PT Eval Moderate Complexity: 1 Procedure PT Treatments $Gait Training: 8-22 mins   PT G Codes:        Dagon Budai, Tessie Fass 05/30/2016, 6:48 PM 05/30/2016  Donnella Sham, PT [phone]  (pager)

## 2016-05-30 NOTE — Progress Notes (Signed)
PROGRESS NOTE [facility]   Farrell Pantaleo   PDB:[id number] DOB: 1941/05/06  DOA: 05/27/2016 PCP: Blair Dolphin, MD   Brief Narrative:   Tommy Peterson is a 75 y.o. male with medical history significant of DM, bone marrow biospy at [facility] 3 days ago for evaluation of leukemia, freq bladder infections recently on cipro brought in by his daughter for high fever and confusion. Patient admitted for sepsis and encephalopathy, started on IV abx and IVF.   Subjective: Feeling somewhat weak today and mild SOB. Continues to have diarrhea, but improving. Had an episode of fever last night.   Assessment & Plan:   Sepsis secondary to C. Difficile pos toxin and antigen - WBC increased today, Had Metabolic encephalopathy 2/2 sepsis - which has resolved  -Continue Oral vancomycin -Monitor CBC -Will resume IVF to add KCL, patient continues to be hypoK+ -Advance diet as tolerated   Anemia - likely to be iron deficiency component increased to TIBC and decreased iron, Hx of bone marrow biopsy ? cell line malignancy  -Start iron supplements -Transfuse if Hb below 7 or symptoms  -Repeat CBC in AM  -Hold anticoagulation   DM 2 - Stable  -SSI -Monitor CBGs -resume Lantus when advanced to full diet   Hypokalemia - likely 2/2 to diarrhea -IVF with KCL  -Replace as needed -Trend BMP   HLD - stable  -Continue home meds  Will repeat CXR given increase in WBC and SOB patient had bronchitis changes and zosyn was discontinued, r/o pulm pathology.   DVT prophylaxis: SCD's Code Status: Full Family Communication: None at bedside Disposition Plan: Anticipated discharge back to previous environment when medically stable    Consultants:   None   Procedures:   None   Antimicrobials:  IV Vanco 10/21  Zosyn 10/21  Oral Vanc 10/22    Objective: Vitals:   05/29/16 1100 05/29/16 1900 05/29/16 2126 05/30/16 0533  BP: 124/64  (!) 135/59 137/67  Pulse: 88  (!) 108 (!) 119  Resp:    18 20  Temp: 99.2 F (37.3 C) 99.7 F (37.6 C) (!) 101.4 F (38.6 C) (!) 100.5 F (38.1 C)  TempSrc: Oral Oral Oral Oral  SpO2: 92%  91% 92%  Weight:      Height:        Intake/Output Summary (Last 24 hours) at 05/30/16 1016 Last data filed at 05/30/16 0500  Gross per 24 hour  Intake          4706.67 ml  Output             4825 ml  Net          -118.33 ml   Filed Weights   05/27/16 1642 05/27/16 2331 05/28/16 0500  Weight: 71.2 kg (157 lb) 70 kg (154 lb 6.4 oz) 70 kg (154 lb 5.2 oz)    Examination:  General exam: Appears calm and comfortable Respiratory system: Clear to auscultation. No wheezes,crackle or rhonchi Cardiovascular system: S1 & S2 heard, RRR. No JVD, murmurs, rubs or gallops Gastrointestinal system: Abdomen is soft and tender and mild distended. No organomegaly or masses felt. Central nervous system: Alert and oriented. No focal neurological deficits. Extremities: 1+ pitting pedal edema.   Data Reviewed: I have personally reviewed following labs and imaging studies  CBC:  Recent Labs Lab 05/27/16 1743 05/28/16 0337 05/28/16 1137 05/29/16 0520  WBC 28.3* 36.5* 30.2* 18.5*  NEUTROABS 18.7*  --  21.2* 10.7*  HGB 6.6* 7.8* 7.0* 7.3*  HCT 20.4*  24.0* 21.1* 22.3*  MCV 106.3* 107.6* 108.8* 106.7*  PLT 454* 448* 390 016*   Basic Metabolic Panel:  Recent Labs Lab 05/27/16 1743 05/28/16 0337 05/29/16 0520 05/29/16 0758  NA 132* 135 136  --   K 3.8 3.0* 2.6*  --   CL 106 105 107  --   CO2 21* 21* 25  --   GLUCOSE 232* 170* 221*  --   BUN 7 5* <5*  --   CREATININE 0.51* 0.54* 0.47*  --   CALCIUM 7.6* 8.0* 7.9*  --   MG  --   --   --  2.0   GFR: Estimated Creatinine Clearance: 77.8 mL/min (by C-G formula based on SCr of 0.47 mg/dL (L)). Liver Function Tests:  Recent Labs Lab 05/27/16 1743  AST 12*  ALT 8*  ALKPHOS 54  BILITOT 0.8  PROT 5.1*  ALBUMIN 2.7*   No results for input(s): LIPASE, AMYLASE in the last 168 hours. No results  for input(s): AMMONIA in the last 168 hours. Coagulation Profile:  Recent Labs Lab 05/28/16 0038  INR 1.36   Cardiac Enzymes: No results for input(s): CKTOTAL, CKMB, CKMBINDEX, TROPONINI in the last 168 hours. BNP (last 3 results) No results for input(s): PROBNP in the last 8760 hours. HbA1C: No results for input(s): HGBA1C in the last 72 hours. CBG: No results for input(s): GLUCAP in the last 168 hours. Lipid Profile: No results for input(s): CHOL, HDL, LDLCALC, TRIG, CHOLHDL, LDLDIRECT in the last 72 hours. Thyroid Function Tests: No results for input(s): TSH, T4TOTAL, FREET4, T3FREE, THYROIDAB in the last 72 hours. Anemia Panel:  Recent Labs  05/27/16 2110  VITAMINB12 1,287*  FOLATE 18.2  FERRITIN 256  TIBC 231*  IRON 22*  RETICCTPCT 2.5   Sepsis Labs:  Recent Labs Lab 05/27/16 1750 05/27/16 2028 05/28/16 0038 05/28/16 0337 05/29/16 0520  PROCALCITON  --   --  0.10  --  0.10  LATICACIDVEN 1.77 3.25* 1.0 1.3  --     Recent Results (from the past 240 hour(s))  Urine culture     Status: Abnormal   Collection Time: 05/27/16  4:50 PM  Result Value Ref Range Status   Specimen Description URINE, CLEAN CATCH  Final   Special Requests NONE  Final   Culture <10,000 COLONIES/mL INSIGNIFICANT GROWTH (A)  Final   Report Status 05/28/2016 FINAL  Final  Blood Culture (routine x 2)     Status: None (Preliminary result)   Collection Time: 05/27/16  5:34 PM  Result Value Ref Range Status   Specimen Description BLOOD LEFT HAND  Final   Special Requests BOTTLES DRAWN AEROBIC ONLY 1CC  Final   Culture NO GROWTH 2 DAYS  Final   Report Status PENDING  Incomplete  Blood Culture (routine x 2)     Status: None (Preliminary result)   Collection Time: 05/27/16  5:43 PM  Result Value Ref Range Status   Specimen Description BLOOD RIGHT HAND  Final   Special Requests BOTTLES DRAWN AEROBIC AND ANAEROBIC 5CC  Final   Culture NO GROWTH 2 DAYS  Final   Report Status PENDING   Incomplete  C difficile quick scan w PCR reflex     Status: Abnormal   Collection Time: 05/27/16  7:29 PM  Result Value Ref Range Status   C Diff antigen POSITIVE (A) NEGATIVE Final   C Diff toxin POSITIVE (A) NEGATIVE Final   C Diff interpretation Toxin producing C. difficile detected.  Final    Comment:  CRITICAL RESULT CALLED TO, READ BACK BY AND VERIFIED WITH: T.DAYE RN AT 0840 ON 05/28/16 BY A.DAVIS   CSF culture     Status: None (Preliminary result)   Collection Time: 05/27/16  9:40 PM  Result Value Ref Range Status   Specimen Description CSF  Final   Special Requests NONE  Final   Gram Stain CYTOSPIN SMEAR NO WBC SEEN NO ORGANISMS SEEN   Final   Culture NO GROWTH 2 DAYS  Final   Report Status PENDING  Incomplete  Culture, blood (routine x 2)     Status: None (Preliminary result)   Collection Time: 05/28/16  5:51 AM  Result Value Ref Range Status   Specimen Description BLOOD BLOOD RIGHT HAND  Final   Special Requests BOTTLES DRAWN AEROBIC AND ANAEROBIC 10CC  Final   Culture NO GROWTH 1 DAY  Final   Report Status PENDING  Incomplete  Culture, blood (routine x 2)     Status: None (Preliminary result)   Collection Time: 05/28/16  5:55 AM  Result Value Ref Range Status   Specimen Description BLOOD BLOOD LEFT HAND  Final   Special Requests BOTTLES DRAWN AEROBIC ONLY 5CC  Final   Culture NO GROWTH 1 DAY  Final   Report Status PENDING  Incomplete      Radiology Studies: No results found.    Scheduled Meds: . ferrous sulfate  325 mg Oral TID WC  . sodium chloride flush  3 mL Intravenous Q12H  . vancomycin  125 mg Oral Q6H   Continuous Infusions:     LOS: 3 days     Chipper Oman, MD [facility] Pager [phone]  If 7PM-7AM, please contact night-coverage [email or web address] Password [email or web address] 05/30/2016, 10:16 AM

## 2016-05-30 NOTE — Care Management Note (Signed)
Case Management Note  Patient Details  Name: Tommy Peterson MRN: [id number] Date of Birth: 1940-11-23  Subjective/Objective:     Pt admitted with sepsis/encephalopathy, history of DM, recent bone marrow biospy 3 days ago for evaluation of leukemia, freq bladder infections recently on cipro. Resides with wife. Pt states independent with ADL's, and no DME usage PTA.  PCP: Cy Blamer  Action/Plan: PT evaluation pending...Marland KitchenMarland KitchenMarland Kitchen CM to f/u with disposition needs.  Expected Discharge Date:                  Expected Discharge Plan:  Home/Self Care (Resides with wife Tommy Peterson)  In-House Referral:     Discharge planning Services  CM Consult  Post Acute Care Choice:    Choice offered to:     DME Arranged:    DME Agency:     HH Arranged:    HH Agency:     Status of Service:  In process, will continue to follow  If discussed at Long Length of Stay Meetings, dates discussed:    Additional Comments:  Sharin Mons, RN 05/30/2016, 1:13 PM

## 2016-05-31 DIAGNOSIS — G9341 Metabolic encephalopathy: Secondary | ICD-10-CM

## 2016-05-31 DIAGNOSIS — A419 Sepsis, unspecified organism: Secondary | ICD-10-CM

## 2016-05-31 DIAGNOSIS — A0472 Enterocolitis due to Clostridium difficile, not specified as recurrent: Secondary | ICD-10-CM

## 2016-05-31 DIAGNOSIS — E872 Acidosis, unspecified: Secondary | ICD-10-CM

## 2016-05-31 LAB — CBC
HEMATOCRIT: 22.4 % — AB (ref 39.0–52.0)
HEMOGLOBIN: 7.5 g/dL — AB (ref 13.0–17.0)
MCH: 35.9 pg — ABNORMAL HIGH (ref 26.0–34.0)
MCHC: 33.5 g/dL (ref 30.0–36.0)
MCV: 107.2 fL — AB (ref 78.0–100.0)
Platelets: 423 10*3/uL — ABNORMAL HIGH (ref 150–400)
RBC: 2.09 MIL/uL — ABNORMAL LOW (ref 4.22–5.81)
RDW: 23.3 % — ABNORMAL HIGH (ref 11.5–15.5)
WBC: 23.6 10*3/uL — AB (ref 4.0–10.5)

## 2016-05-31 LAB — BASIC METABOLIC PANEL
ANION GAP: 7 (ref 5–15)
BUN: 5 mg/dL — ABNORMAL LOW (ref 6–20)
CHLORIDE: 104 mmol/L (ref 101–111)
CO2: 23 mmol/L (ref 22–32)
Calcium: 8 mg/dL — ABNORMAL LOW (ref 8.9–10.3)
Creatinine, Ser: 0.64 mg/dL (ref 0.61–1.24)
GFR calc Af Amer: >60 mL/min (ref >60)
GLUCOSE: 146 mg/dL — AB (ref 65–99)
POTASSIUM: 3 mmol/L — AB (ref 3.5–5.1)
Sodium: 134 mmol/L — ABNORMAL LOW (ref 135–145)

## 2016-05-31 LAB — GLUCOSE, CAPILLARY
GLUCOSE-CAPILLARY: 220 mg/dL — AB (ref 65–99)
GLUCOSE-CAPILLARY: 160 mg/dL — AB (ref 65–99)
Glucose-Capillary: 128 mg/dL — ABNORMAL HIGH (ref 65–99)
Glucose-Capillary: 139 mg/dL — ABNORMAL HIGH (ref 65–99)

## 2016-05-31 LAB — CSF CULTURE W GRAM STAIN: Gram Stain: NONE SEEN

## 2016-05-31 LAB — PROCALCITONIN: Procalcitonin: 0.15 ng/mL

## 2016-05-31 LAB — CSF CULTURE: CULTURE: NO GROWTH

## 2016-05-31 MED ORDER — INSULIN ASPART 100 UNIT/ML ~~LOC~~ SOLN
0.0000 [IU] | Freq: Three times a day (TID) | SUBCUTANEOUS | Status: DC
  Administered 2016-06-01: 3 [IU] via SUBCUTANEOUS

## 2016-05-31 MED ORDER — POTASSIUM CHLORIDE CRYS ER 20 MEQ PO TBCR: 40.0000 meq | EXTENDED_RELEASE_TABLET | Freq: Once | ORAL | Status: DC

## 2016-05-31 MED ORDER — INSULIN ASPART 100 UNIT/ML ~~LOC~~ SOLN: 0.0000 [IU] | Freq: Every day | SUBCUTANEOUS | Status: DC

## 2016-05-31 NOTE — Progress Notes (Signed)
PROGRESS NOTE  Tommy Peterson [id number] DOB: 01-27-1941 DOA: 05/27/2016 PCP: Janett Billow, MD  Brief History:  Tommy Peterson a 75 y.o.malewith medical history significant of DM, bone marrow biospy at [facility] 3 days ago for evaluation of leukemia, freq bladder infections recently on cipro brought in by his daughter for high fever and confusion. Patient admitted for sepsis and encephalopathy, started on IV abx and IVF.    Assessment & Plan:   Sepsis secondary to C. [place] increased today,  -Continue Oral vancomycin -Monitor CBC -CT abd/pelvis if WBC continues to increase -Advance diet as tolerated  -05/30/2016 chest x-ray--negative consolidation -Check UA and urine culture  Acute metabolic encephalopathy -secondary to infection -improved  Anemia - of chronic disease -Start iron supplements -iron sat 10%, ferritin 256 -Transfuse if Hb below 7 or symptoms  -Repeat CBC in AM  -Hold anticoagulation   DM 2 - Stable  -SSI -Monitor CBGs -resume Lantus -Escalated to moderate SSI -05/04/2016 hemoglobin A1c 8.8  Hypokalemia - likely 2/2 to diarrhea -IVF with KCL  -Replace as needed -Trend BMP  -check mag  HLD - stable  -Continue home meds     Disposition Plan:   Home 10/26 if WBC decreases  Family Communication:  No Family at bedside--Total time spent 35 minutes.  Greater than 50% spent face to face counseling and coordinating care.   Consultants:  None  Code Status:  FULL  DVT Prophylaxis:  SCDs   Procedures: As Listed in Progress Note Above  Antibiotics: None    Subjective: Patient denies fevers, chills, headache, chest pain, dyspnea, nausea, vomiting, diarrhea, abdominal pain, dysuria, hematuria, hematochezia, and melena.   Objective: Vitals:   05/30/16 2226 05/30/16 2321 05/31/16 0359 05/31/16 0617  BP: (!) 127/58   121/65  Pulse: 83   93  Resp: 17   17  Temp: (!) 100.6 F (38.1 C) 99.1 F (37.3 C)  99.3 F  (37.4 C)  TempSrc: Oral   Oral  SpO2: 93%   98%  Weight:   69.4 kg (153 lb)   Height:        Intake/Output Summary (Last 24 hours) at 05/31/16 1819 Last data filed at 05/31/16 1723  Gross per 24 hour  Intake          1853.75 ml  Output             3750 ml  Net         -1896.25 ml   Weight change:  Exam:   General:  Pt is alert, follows commands appropriately, not in acute distress  HEENT: No icterus, No thrush, No neck mass, [place]/AT  Cardiovascular: RRR, S1/S2, no rubs, no gallops  Respiratory: CTA bilaterally, no wheezing, no crackles, no rhonchi  Abdomen: Soft/+BS, non tender, non distended, no guarding  Extremities: No edema, No lymphangitis, No petechiae, No rashes, no synovitis   Data Reviewed: I have personally reviewed following labs and imaging studies Basic Metabolic Panel:  Recent Labs Lab 05/27/16 1743 05/28/16 0337 05/29/16 0520 05/29/16 0758 05/30/16 1045 05/31/16 1548  NA 132* 135 136  --  132* 134*  K 3.8 3.0* 2.6*  --  2.7* 3.0*  CL 106 105 107  --  101 104  CO2 21* 21* 25  --  21* 23  GLUCOSE 232* 170* 221*  --  270* 146*  BUN 7 5* <5*  --  <5* 5*  CREATININE 0.51* 0.54* 0.47*  --  0.56*  0.64  CALCIUM 7.6* 8.0* 7.9*  --  7.8* 8.0*  MG  --   --   --  2.0  --   --    Liver Function Tests:  Recent Labs Lab 05/27/16 1743  AST 12*  ALT 8*  ALKPHOS 54  BILITOT 0.8  PROT 5.1*  ALBUMIN 2.7*   No results for input(s): LIPASE, AMYLASE in the last 168 hours. No results for input(s): AMMONIA in the last 168 hours. Coagulation Profile:  Recent Labs Lab 05/28/16 0038  INR 1.36   CBC:  Recent Labs Lab 05/27/16 1743 05/28/16 0337 05/28/16 1137 05/29/16 0520 05/30/16 1045 05/31/16 1548  WBC 28.3* 36.5* 30.2* 18.5* 23.5* 23.6*  NEUTROABS 18.7*  --  21.2* 10.7* 16.5*  --   HGB 6.6* 7.8* 7.0* 7.3* 7.7* 7.5*  HCT 20.4* 24.0* 21.1* 22.3* 23.5* 22.4*  MCV 106.3* 107.6* 108.8* 106.7* 106.8* 107.2*  PLT 454* 448* 390 403* 455* 423*    Cardiac Enzymes: No results for input(s): CKTOTAL, CKMB, CKMBINDEX, TROPONINI in the last 168 hours. BNP: Invalid input(s): POCBNP CBG:  Recent Labs Lab 05/30/16 2211 05/31/16 0750 05/31/16 1214 05/31/16 1653  GLUCAP 250* 128* 220* 139*   HbA1C: No results for input(s): HGBA1C in the last 72 hours. Urine analysis:    Component Value Date/Time   COLORURINE YELLOW 05/27/2016 [place] 05/27/2016 1650   LABSPEC 1.027 05/27/2016 1650   PHURINE 5.5 05/27/2016 1650   GLUCOSEU >1000 (A) 05/27/2016 1650   HGBUR NEGATIVE 05/27/2016 1650   BILIRUBINUR NEGATIVE 05/27/2016 1650   KETONESUR 15 (A) 05/27/2016 1650   PROTEINUR NEGATIVE 05/27/2016 1650   NITRITE NEGATIVE 05/27/2016 1650   LEUKOCYTESUR TRACE (A) 05/27/2016 1650   Sepsis Labs: @LABRCNTIP (procalcitonin:4,lacticidven:4) ) Recent Results (from the past 240 hour(s))  Urine culture     Status: Abnormal   Collection Time: 05/27/16  4:50 PM  Result Value Ref Range Status   Specimen Description URINE, CLEAN CATCH  Final   Special Requests NONE  Final   Culture <10,000 COLONIES/mL INSIGNIFICANT GROWTH (A)  Final   Report Status 05/28/2016 FINAL  Final  Blood Culture (routine x 2)     Status: None (Preliminary result)   Collection Time: 05/27/16  5:34 PM  Result Value Ref Range Status   Specimen Description BLOOD LEFT HAND  Final   Special Requests BOTTLES DRAWN AEROBIC ONLY 1CC  Final   Culture NO GROWTH 4 DAYS  Final   Report Status PENDING  Incomplete  Blood Culture (routine x 2)     Status: None (Preliminary result)   Collection Time: 05/27/16  5:43 PM  Result Value Ref Range Status   Specimen Description BLOOD RIGHT HAND  Final   Special Requests BOTTLES DRAWN AEROBIC AND ANAEROBIC 5CC  Final   Culture NO GROWTH 4 DAYS  Final   Report Status PENDING  Incomplete  C difficile quick scan w PCR reflex     Status: Abnormal   Collection Time: 05/27/16  7:29 PM  Result Value Ref Range Status   C Diff  antigen POSITIVE (A) NEGATIVE Final   C Diff toxin POSITIVE (A) NEGATIVE Final   C Diff interpretation Toxin producing C. difficile detected.  Final    Comment: CRITICAL RESULT CALLED TO, READ BACK BY AND VERIFIED WITH: T.DAYE RN AT 0840 ON 05/28/16 BY A.DAVIS   CSF culture     Status: None   Collection Time: 05/27/16  9:40 PM  Result Value Ref Range Status  Specimen Description CSF  Final   Special Requests NONE  Final   Gram Stain CYTOSPIN SMEAR NO WBC SEEN NO ORGANISMS SEEN   Final   Culture NO GROWTH 3 DAYS  Final   Report Status 05/31/2016 FINAL  Final  Culture, blood (routine x 2)     Status: None (Preliminary result)   Collection Time: 05/28/16  5:51 AM  Result Value Ref Range Status   Specimen Description BLOOD BLOOD RIGHT HAND  Final   Special Requests BOTTLES DRAWN AEROBIC AND ANAEROBIC 10CC  Final   Culture NO GROWTH 3 DAYS  Final   Report Status PENDING  Incomplete  Culture, blood (routine x 2)     Status: None (Preliminary result)   Collection Time: 05/28/16  5:55 AM  Result Value Ref Range Status   Specimen Description BLOOD BLOOD LEFT HAND  Final   Special Requests BOTTLES DRAWN AEROBIC ONLY 5CC  Final   Culture NO GROWTH 3 DAYS  Final   Report Status PENDING  Incomplete     Scheduled Meds: . ferrous sulfate  325 mg Oral TID WC  . insulin aspart  0-5 Units Subcutaneous QHS  . insulin aspart  0-9 Units Subcutaneous TID WC  . insulin glargine  24 Units Subcutaneous QHS  . sodium chloride flush  3 mL Intravenous Q12H  . vancomycin  125 mg Oral Q6H   Continuous Infusions: . 0.45 % NaCl with KCl 20 mEq / L 75 mL/hr at 05/31/16 1510    Procedures/Studies: Dg Chest 2 View  Result Date: 05/30/2016 CLINICAL DATA:  Shortness of breath EXAM: CHEST  2 VIEW COMPARISON:  05/27/2016 FINDINGS: Streaky retrocardiac opacity is attributed to scar given stability since at least 2015 and mild volume loss. There is no edema, consolidation, or pneumothorax. Normal heart size  and mediastinal contours. Trace right pleural fluid. IMPRESSION: 1. Trace right pleural fluid without edema or consolidation. 2. Stable mild retrocardiac scarring. Electronically Signed   By: Monte Fantasia M.D.   On: 05/30/2016 17:26   Dg Chest 2 View  Result Date: 05/04/2016 CLINICAL DATA:  75 year old with acute onset of left-sided chest pain which began earlier today. Patient states an approximate 10 day history of cough. EXAM: CHEST  2 VIEW COMPARISON:  12/21/2013, 06/28/2013. FINDINGS: Cardiac silhouette normal in size, unchanged. Thoracic aorta atherosclerotic, unchanged. Hilar and mediastinal contours otherwise unremarkable. Lungs clear. Bronchovascular markings normal. Pulmonary vascularity normal. No visible pleural effusions. No pneumothorax. Degenerative changes involving the thoracic spine and osseous demineralization. IMPRESSION: 1.  No acute cardiopulmonary disease. 2. Thoracic aortic atherosclerosis. Electronically Signed   By: Evangeline Dakin M.D.   On: 05/04/2016 17:07   Dg Chest Port 1 View  Result Date: 05/28/2016 CLINICAL DATA:  Sepsis. History of diabetes and monoclonal gammopathy. EXAM: PORTABLE CHEST 1 VIEW COMPARISON:  Chest radiograph May 27, 2016 FINDINGS: Cardiac silhouette is mildly enlarged. Moderate bronchitic change without pleural effusion or focal consolidation. Biapical pleural thickening. No pneumothorax. Patient rotated to the RIGHT. Osseous structures are nonsuspicious. IMPRESSION: Mild cardiomegaly.  Bronchitic changes without focal consolidation. Electronically Signed   By: Elon Alas M.D.   On: 05/28/2016 00:18   Dg Chest Port 1 View  Result Date: 05/27/2016 CLINICAL DATA:  Fever, altered mental status EXAM: PORTABLE CHEST 1 VIEW COMPARISON:  04/26/2016 FINDINGS: The heart size and mediastinal contours are within normal limits. Both lungs are clear. Atherosclerotic calcifications of thoracic aorta. Degenerative changes thoracic spine. IMPRESSION: No  active disease. Electronically Signed   By: Julien Girt  Pop M.D.   On: 05/27/2016 17:14    Linah Klapper, DO  [facility] Pager [phone]  If 7PM-7AM, please contact night-coverage [email or web address] Password [email or web address] 05/31/2016, 6:19 PM   LOS: 4 days

## 2016-05-31 NOTE — Progress Notes (Signed)
Physical Therapy Treatment Patient Details Name: Tommy Peterson MRN: [id number] DOB: [birth year] Today's Date: 05/31/2016    History of Present Illness pt is a 75 y/o male with pmh of DM and frequent UTI's, admitted with high fever (103) and confusion. Working dx is sepsis due to c-diff toxin.    PT Comments    Patient continues demonstrate decreased strength and balance limiting his mobility. Recommending HHPT for further skilled PT services to maximize independence and safety with mobility.   Follow Up Recommendations  Supervision for mobility/OOB;Home health PT      Equipment Recommendations  None recommended by PT    Recommendations for Other Services       Precautions / Restrictions Precautions Precautions: Fall    Mobility  Bed Mobility Overal bed mobility: Needs Assistance Bed Mobility: Supine to Sit     Supine to sit: Supervision;HOB elevated     General bed mobility comments: supervision for safety; increased time and effort; use of rail and HOB elevated  Transfers Overall transfer level: Needs assistance Equipment used: Rolling walker (2 wheeled) Transfers: Sit to/from Stand Sit to Stand: Min guard         General transfer comment: min guard for safety  Ambulation/Gait Ambulation/Gait assistance: Min assist;Min guard Ambulation Distance (Feet): 200 Feet Assistive device: None Gait Pattern/deviations: Step-through pattern     General Gait Details: min A initial 72ft due to unsteadiness and poor bilat LE coordination with improvements demonstrated with increased distance; min guard for safety rest of gait distance   Stairs            Wheelchair Mobility    Modified Rankin (Stroke Patients Only)       Balance     Sitting balance-Leahy Scale: Good       Standing balance-Leahy Scale: Fair                      Cognition Arousal/Alertness: Awake/alert Behavior During Therapy: WFL for tasks assessed/performed Overall  Cognitive Status: Within Functional Limits for tasks assessed                      Exercises Total Joint Exercises Standing Hip Extension: AROM;Both;10 reps;Standing General Exercises - Lower Extremity Hip ABduction/ADduction: AROM;Both;10 reps;Standing Hip Flexion/Marching: AROM;Both;10 reps;Standing Heel Raises: AROM;Both;10 reps;Standing    General Comments        Pertinent Vitals/Pain Pain Assessment: No/denies pain    Home Living                      Prior Function            PT Goals (current goals can now be found in the care plan section) Acute Rehab PT Goals Patient Stated Goal: get strength back Progress towards PT goals: Progressing toward goals    Frequency    Min 3X/week      PT Plan Discharge plan needs to be updated    Co-evaluation             End of Session Equipment Utilized During Treatment: Gait belt Activity Tolerance: Patient tolerated treatment well Patient left: with call bell/phone within reach;in chair;with chair alarm set     Time: 1457-1533 PT Time Calculation (min) (ACUTE ONLY): 36 min  Charges:  $Gait Training: 8-22 mins $Therapeutic Exercise: 8-22 mins                    G Codes:      [employer]  [place], PTA Pager: [phone]   05/31/2016, 3:45 PM

## 2016-06-01 LAB — BASIC METABOLIC PANEL
Anion gap: 7 (ref 5–15)
BUN: <5 mg/dL — ABNORMAL LOW (ref 6–20)
CALCIUM: 8 mg/dL — AB (ref 8.9–10.3)
CO2: 24 mmol/L (ref 22–32)
CREATININE: 0.43 mg/dL — AB (ref 0.61–1.24)
Chloride: 106 mmol/L (ref 101–111)
Glucose, Bld: 96 mg/dL (ref 65–99)
Potassium: 2.9 mmol/L — ABNORMAL LOW (ref 3.5–5.1)
SODIUM: 137 mmol/L (ref 135–145)

## 2016-06-01 LAB — CBC
HCT: 22.7 % — ABNORMAL LOW (ref 39.0–52.0)
Hemoglobin: 7.6 g/dL — ABNORMAL LOW (ref 13.0–17.0)
MCH: 35.2 pg — AB (ref 26.0–34.0)
MCHC: 33.5 g/dL (ref 30.0–36.0)
MCV: 105.1 fL — ABNORMAL HIGH (ref 78.0–100.0)
PLATELETS: 457 10*3/uL — AB (ref 150–400)
RBC: 2.16 MIL/uL — AB (ref 4.22–5.81)
RDW: 23.2 % — ABNORMAL HIGH (ref 11.5–15.5)
WBC: 19.6 10*3/uL — ABNORMAL HIGH (ref 4.0–10.5)

## 2016-06-01 LAB — CULTURE, BLOOD (ROUTINE X 2)
CULTURE: NO GROWTH
Culture: NO GROWTH

## 2016-06-01 LAB — GLUCOSE, CAPILLARY
Glucose-Capillary: 98 mg/dL (ref 65–99)
Glucose-Capillary: 161 mg/dL — ABNORMAL HIGH (ref 65–99)

## 2016-06-01 LAB — MAGNESIUM: MAGNESIUM: 1.9 mg/dL (ref 1.7–2.4)

## 2016-06-01 MED ORDER — VANCOMYCIN 50 MG/ML ORAL SOLUTION: 125.0000 mg | Freq: Four times a day (QID) | ORAL | 0 refills | Status: DC

## 2016-06-01 MED ORDER — POTASSIUM CHLORIDE CRYS ER 20 MEQ PO TBCR: 40.0000 meq | EXTENDED_RELEASE_TABLET | Freq: Once | ORAL | Status: DC

## 2016-06-01 MED ORDER — POTASSIUM CHLORIDE CRYS ER 20 MEQ PO TBCR: 40.0000 meq | EXTENDED_RELEASE_TABLET | Freq: Once | ORAL | 0 refills | Status: DC

## 2016-06-01 NOTE — Care Management Note (Signed)
Case Management Note  Patient Details  Name: Tommy Peterson MRN: [id number] Date of Birth: 1941-01-22  Subjective/Objective:                    Action/Plan: Plan is to d/c to home today with home health services.  Expected Discharge Date:                  Expected Discharge Plan:  Home/Self Care (Resides with wife Inez Catalina)  In-House Referral:     Discharge planning Services  CM Consult  Post Acute Care Choice:    Choice offered to:  Patient  DME Arranged:    DME Agency:     HH Arranged:  PT [place]:  [employer]/referral made with Santiago Glad @ [phone] by CM  Status of Service:  Completed, signed off  If discussed at [place] of Stay Meetings, dates discussed:    Additional Comments:  Sharin Mons, RN 06/01/2016, 3:24 PM

## 2016-06-01 NOTE — Discharge Summary (Signed)
Physician Discharge Summary  Tommy Peterson [id number] DOB: [birth year] DOA: 05/27/2016  PCP: Janett Billow, MD  Admit date: 05/27/2016 Discharge date: 06/01/2016  Admitted From: Home Disposition: Home  Recommendations for Outpatient Follow-up:  1. Follow up with PCP in 1-2 weeks 2. Please obtain BMP/CBC in one week   Home Health: Yes Equipment/Devices:HHPT  Discharge Condition:Stable CODE STATUS: FULL Diet recommendation: Heart Healthy   Brief/Interim Summary: Tommy Peterson a 75 y.o.malewith medical history significant of DM, bone marrow biospy at [facility] 3 days ago for evaluation of leukemia, freq bladder infections recently on cipro brought in by his daughter for high fever and confusion. Patient admitted for sepsis and encephalopathy, started on IV abx and IVF.   Discharge Diagnoses:  Sepsis secondary to C. [place] increased today,  -Continue Oral vancomycin -Monitor CBC -overall WBC continues to improve -pt has baseline leukocytosis--12 to 16K--suspect underlying hematologic disorder for which he recently had BM biospy with hematology, Dr. Tasia Catchings at [facility] -clinically improved with decreased stools, now with substance -Advance diet as tolerated  -05/30/2016 chest x-ray--negative consolidation -home with PO vancomycin 125 mg po q 6 hr x 6 more days  Acute metabolic encephalopathy -secondary to infection -improved--back to baseline  Anemia - of chronic disease -Start iron supplements -iron sat 10%, ferritin 256 -Transfuse if Hb below 7 or symptoms  -HGB remained stable throughout the hospitalization -FOBT neg -restart ASA after d/c  DM 2 - Stable  -SSI -Monitor CBGs -resume Lantus -Escalated to moderate SSI -05/04/2016 hemoglobin A1c 8.8 -follow up with PCP for further titration  Hypokalemia - likely 2/2 to diarrhea -IVF with KCL  -Replaced-->home with one additional dose of KCL 40 mEQ on 10/27 -Trend BMP  -check mag--1.9  HLD -  stable  -Continue home meds   Discharge Instructions  Discharge Instructions    Diet - low sodium heart healthy    Complete by:  As directed    Increase activity slowly    Complete by:  As directed        Medication List    STOP taking these medications   cephALEXin 500 MG capsule Commonly known as:  KEFLEX     TAKE these medications   aspirin 81 MG tablet Take 81 mg by mouth daily.   budesonide 3 MG 24 hr capsule Commonly known as:  ENTOCORT EC Take 6 mg by mouth daily. What changed:  Another medication with the same name was removed. Continue taking this medication, and follow the directions you see here.   LANTUS SOLOSTAR 100 UNIT/ML Solostar Pen Generic drug:  Insulin Glargine Inject 24 Units into the skin at bedtime.   oxycodone 5 MG capsule Commonly known as:  OXY-IR Take 5 mg by mouth every 6 (six) hours as needed for pain.   potassium chloride SA 20 MEQ tablet Commonly known as:  K-DUR,KLOR-CON Take 2 tablets (40 mEq total) by mouth once. Start taking on:  06/02/2016   traMADol 50 MG tablet Commonly known as:  ULTRAM Take 50 mg by mouth daily.   triamcinolone cream 0.1 % Commonly known as:  KENALOG Apply 1 application topically 2 (two) times daily. What changed:  when to take this  reasons to take this   vancomycin 50 mg/mL oral solution Commonly known as:  VANCOCIN Take 2.5 mLs (125 mg total) by mouth every 6 (six) hours.       Allergies  Allergen Reactions  . Bee Venom Swelling  . Metformin And Related Nausea Only    Consultations:  none   Procedures/Studies: Dg Chest 2 View  Result Date: 05/30/2016 CLINICAL DATA:  Shortness of breath EXAM: CHEST  2 VIEW COMPARISON:  05/27/2016 FINDINGS: Streaky retrocardiac opacity is attributed to scar given stability since at least 2015 and mild volume loss. There is no edema, consolidation, or pneumothorax. Normal heart size and mediastinal contours. Trace right pleural fluid. IMPRESSION: 1.  Trace right pleural fluid without edema or consolidation. 2. Stable mild retrocardiac scarring. Electronically Signed   By: Monte Fantasia M.D.   On: 05/30/2016 17:26   Dg Chest 2 View  Result Date: 05/04/2016 CLINICAL DATA:  75 year old with acute onset of left-sided chest pain which began earlier today. Patient states an approximate 10 day history of cough. EXAM: CHEST  2 VIEW COMPARISON:  12/21/2013, 06/28/2013. FINDINGS: Cardiac silhouette normal in size, unchanged. Thoracic aorta atherosclerotic, unchanged. Hilar and mediastinal contours otherwise unremarkable. Lungs clear. Bronchovascular markings normal. Pulmonary vascularity normal. No visible pleural effusions. No pneumothorax. Degenerative changes involving the thoracic spine and osseous demineralization. IMPRESSION: 1.  No acute cardiopulmonary disease. 2. Thoracic aortic atherosclerosis. Electronically Signed   By: Evangeline Dakin M.D.   On: 05/04/2016 17:07   Dg Chest Port 1 View  Result Date: 05/28/2016 CLINICAL DATA:  Sepsis. History of diabetes and monoclonal gammopathy. EXAM: PORTABLE CHEST 1 VIEW COMPARISON:  Chest radiograph May 27, 2016 FINDINGS: Cardiac silhouette is mildly enlarged. Moderate bronchitic change without pleural effusion or focal consolidation. Biapical pleural thickening. No pneumothorax. Patient rotated to the RIGHT. Osseous structures are nonsuspicious. IMPRESSION: Mild cardiomegaly.  Bronchitic changes without focal consolidation. Electronically Signed   By: Elon Alas M.D.   On: 05/28/2016 00:18   Dg Chest Port 1 View  Result Date: 05/27/2016 CLINICAL DATA:  Fever, altered mental status EXAM: PORTABLE CHEST 1 VIEW COMPARISON:  04/26/2016 FINDINGS: The heart size and mediastinal contours are within normal limits. Both lungs are clear. Atherosclerotic calcifications of thoracic aorta. Degenerative changes thoracic spine. IMPRESSION: No active disease. Electronically Signed   By: Lahoma Crocker M.D.   On:  05/27/2016 17:14        Discharge Exam: Vitals:   05/31/16 2147 06/01/16 0532  BP: (!) 126/59 (!) 124/59  Pulse: 81 97  Resp: 16 18  Temp: 98.9 F (37.2 C) 97.6 F (36.4 C)   Vitals:   05/31/16 0359 05/31/16 0617 05/31/16 2147 06/01/16 0532  BP:  121/65 (!) 126/59 (!) 124/59  Pulse:  93 81 97  Resp:  17 16 18   Temp:  99.3 F (37.4 C) 98.9 F (37.2 C) 97.6 F (36.4 C)  TempSrc:  Oral Oral Oral  SpO2:  98% 95% 97%  Weight: 69.4 kg (153 lb)  69.6 kg (153 lb 7 oz)   Height:        General: Pt is alert, awake, not in acute distress Cardiovascular: RRR, S1/S2 +, no rubs, no gallops Respiratory: CTA bilaterally, no wheezing, no rhonchi Abdominal: Soft, NT, ND, bowel sounds + Extremities: no edema, no cyanosis   The results of significant diagnostics from this hospitalization (including imaging, microbiology, ancillary and laboratory) are listed below for reference.    Significant Diagnostic Studies: Dg Chest 2 View  Result Date: 05/30/2016 CLINICAL DATA:  Shortness of breath EXAM: CHEST  2 VIEW COMPARISON:  05/27/2016 FINDINGS: Streaky retrocardiac opacity is attributed to scar given stability since at least 2015 and mild volume loss. There is no edema, consolidation, or pneumothorax. Normal heart size and mediastinal contours. Trace right pleural fluid. IMPRESSION: 1. Trace right  pleural fluid without edema or consolidation. 2. Stable mild retrocardiac scarring. Electronically Signed   By: Monte Fantasia M.D.   On: 05/30/2016 17:26   Dg Chest 2 View  Result Date: 05/04/2016 CLINICAL DATA:  75 year old with acute onset of left-sided chest pain which began earlier today. Patient states an approximate 10 day history of cough. EXAM: CHEST  2 VIEW COMPARISON:  12/21/2013, 06/28/2013. FINDINGS: Cardiac silhouette normal in size, unchanged. Thoracic aorta atherosclerotic, unchanged. Hilar and mediastinal contours otherwise unremarkable. Lungs clear. Bronchovascular markings  normal. Pulmonary vascularity normal. No visible pleural effusions. No pneumothorax. Degenerative changes involving the thoracic spine and osseous demineralization. IMPRESSION: 1.  No acute cardiopulmonary disease. 2. Thoracic aortic atherosclerosis. Electronically Signed   By: Evangeline Dakin M.D.   On: 05/04/2016 17:07   Dg Chest Port 1 View  Result Date: 05/28/2016 CLINICAL DATA:  Sepsis. History of diabetes and monoclonal gammopathy. EXAM: PORTABLE CHEST 1 VIEW COMPARISON:  Chest radiograph May 27, 2016 FINDINGS: Cardiac silhouette is mildly enlarged. Moderate bronchitic change without pleural effusion or focal consolidation. Biapical pleural thickening. No pneumothorax. Patient rotated to the RIGHT. Osseous structures are nonsuspicious. IMPRESSION: Mild cardiomegaly.  Bronchitic changes without focal consolidation. Electronically Signed   By: Elon Alas M.D.   On: 05/28/2016 00:18   Dg Chest Port 1 View  Result Date: 05/27/2016 CLINICAL DATA:  Fever, altered mental status EXAM: PORTABLE CHEST 1 VIEW COMPARISON:  04/26/2016 FINDINGS: The heart size and mediastinal contours are within normal limits. Both lungs are clear. Atherosclerotic calcifications of thoracic aorta. Degenerative changes thoracic spine. IMPRESSION: No active disease. Electronically Signed   By: Lahoma Crocker M.D.   On: 05/27/2016 17:14     Microbiology: Recent Results (from the past 240 hour(s))  Urine culture     Status: Abnormal   Collection Time: 05/27/16  4:50 PM  Result Value Ref Range Status   Specimen Description URINE, CLEAN CATCH  Final   Special Requests NONE  Final   Culture <10,000 COLONIES/mL INSIGNIFICANT GROWTH (A)  Final   Report Status 05/28/2016 FINAL  Final  Blood Culture (routine x 2)     Status: None (Preliminary result)   Collection Time: 05/27/16  5:34 PM  Result Value Ref Range Status   Specimen Description BLOOD LEFT HAND  Final   Special Requests BOTTLES DRAWN AEROBIC ONLY 1CC   Final   Culture NO GROWTH 4 DAYS  Final   Report Status PENDING  Incomplete  Blood Culture (routine x 2)     Status: None (Preliminary result)   Collection Time: 05/27/16  5:43 PM  Result Value Ref Range Status   Specimen Description BLOOD RIGHT HAND  Final   Special Requests BOTTLES DRAWN AEROBIC AND ANAEROBIC 5CC  Final   Culture NO GROWTH 4 DAYS  Final   Report Status PENDING  Incomplete  C difficile quick scan w PCR reflex     Status: Abnormal   Collection Time: 05/27/16  7:29 PM  Result Value Ref Range Status   C Diff antigen POSITIVE (A) NEGATIVE Final   C Diff toxin POSITIVE (A) NEGATIVE Final   C Diff interpretation Toxin producing C. difficile detected.  Final    Comment: CRITICAL RESULT CALLED TO, READ BACK BY AND VERIFIED WITH: T.DAYE RN AT 0840 ON 05/28/16 BY A.DAVIS   CSF culture     Status: None   Collection Time: 05/27/16  9:40 PM  Result Value Ref Range Status   Specimen Description CSF  Final   Special  Requests NONE  Final   Gram Stain CYTOSPIN SMEAR NO WBC SEEN NO ORGANISMS SEEN   Final   Culture NO GROWTH 3 DAYS  Final   Report Status 05/31/2016 FINAL  Final  Culture, blood (routine x 2)     Status: None (Preliminary result)   Collection Time: 05/28/16  5:51 AM  Result Value Ref Range Status   Specimen Description BLOOD BLOOD RIGHT HAND  Final   Special Requests BOTTLES DRAWN AEROBIC AND ANAEROBIC 10CC  Final   Culture NO GROWTH 3 DAYS  Final   Report Status PENDING  Incomplete  Culture, blood (routine x 2)     Status: None (Preliminary result)   Collection Time: 05/28/16  5:55 AM  Result Value Ref Range Status   Specimen Description BLOOD BLOOD LEFT HAND  Final   Special Requests BOTTLES DRAWN AEROBIC ONLY 5CC  Final   Culture NO GROWTH 3 DAYS  Final   Report Status PENDING  Incomplete     Labs: Basic Metabolic Panel:  Recent Labs Lab 05/28/16 0337 05/29/16 0520 05/29/16 0758 05/30/16 1045 05/31/16 1548 06/01/16 0458  NA 135 136  --  132*  134* 137  K 3.0* 2.6*  --  2.7* 3.0* 2.9*  CL 105 107  --  101 104 106  CO2 21* 25  --  21* 23 24  GLUCOSE 170* 221*  --  270* 146* 96  BUN 5* <5*  --  <5* 5* <5*  CREATININE 0.54* 0.47*  --  0.56* 0.64 0.43*  CALCIUM 8.0* 7.9*  --  7.8* 8.0* 8.0*  MG  --   --  2.0  --   --  1.9   Liver Function Tests:  Recent Labs Lab 05/27/16 1743  AST 12*  ALT 8*  ALKPHOS 54  BILITOT 0.8  PROT 5.1*  ALBUMIN 2.7*   No results for input(s): LIPASE, AMYLASE in the last 168 hours. No results for input(s): AMMONIA in the last 168 hours. CBC:  Recent Labs Lab 05/27/16 1743  05/28/16 1137 05/29/16 0520 05/30/16 1045 05/31/16 1548 06/01/16 0458  WBC 28.3*  < > 30.2* 18.5* 23.5* 23.6* 19.6*  NEUTROABS 18.7*  --  21.2* 10.7* 16.5*  --   --   HGB 6.6*  < > 7.0* 7.3* 7.7* 7.5* 7.6*  HCT 20.4*  < > 21.1* 22.3* 23.5* 22.4* 22.7*  MCV 106.3*  < > 108.8* 106.7* 106.8* 107.2* 105.1*  PLT 454*  < > 390 403* 455* 423* 457*  < > = values in this interval not displayed. Cardiac Enzymes: No results for input(s): CKTOTAL, CKMB, CKMBINDEX, TROPONINI in the last 168 hours. BNP: Invalid input(s): POCBNP CBG:  Recent Labs Lab 05/31/16 1214 05/31/16 1653 05/31/16 2145 06/01/16 0827 06/01/16 1154  GLUCAP 220* 139* 160* 98 161*    Time coordinating discharge:  Greater than 30 minutes  Signed:  Codi Folkerts, DO [facility] Pager: [phone] 06/01/2016, 1:32 PM

## 2016-06-01 NOTE — Progress Notes (Signed)
Pt refused bed alarm after using bathroom this morning pt said he doesn't want to wait when needs to use the bathroom to poop, pt is not  having diarrhoea and not even using bathroom frequent because of condom cath on

## 2016-06-02 LAB — CULTURE, BLOOD (ROUTINE X 2)
CULTURE: NO GROWTH
CULTURE: NO GROWTH

## 2016-06-08 ENCOUNTER — Other Ambulatory Visit (HOSPITAL_COMMUNITY): Payer: Self-pay | Admitting: Otolaryngology

## 2016-06-08 DIAGNOSIS — R1319 Other dysphagia: Secondary | ICD-10-CM

## 2016-06-13 ENCOUNTER — Other Ambulatory Visit (HOSPITAL_COMMUNITY): Payer: Self-pay | Admitting: Pediatrics

## 2016-06-13 ENCOUNTER — Ambulatory Visit (HOSPITAL_COMMUNITY)
Admission: RE | Admit: 2016-06-13 | Discharge: 2016-06-13 | Disposition: A | Discharge status: Home / Self Care | Payer: Medicare Other | Source: Ambulatory Visit | Attending: Otolaryngology | Admitting: Otolaryngology

## 2016-06-13 DIAGNOSIS — R1310 Dysphagia, unspecified: Secondary | ICD-10-CM | POA: Insufficient documentation

## 2016-06-13 DIAGNOSIS — R1319 Other dysphagia: Secondary | ICD-10-CM

## 2016-06-13 IMAGING — RF DG SWALLOWING FUNCTION
1 series · 17 of 24 positions shown · non-contrast
Comparison: None.

CLINICAL DATA: Coughing with meals, sensation of food sticking in
throat, chokes on solids and liquids

EXAM:
MODIFIED BARIUM SWALLOW
TECHNIQUE: Different consistencies of barium were administered orally to the
patient by the Speech Pathologist. Imaging of the pharynx was
performed in the lateral projection.
FLUOROSCOPY TIME:  Fluoroscopy Time:  1 minutes 47 seconds
Number of Acquired Spot Images: 24

[Series 1: run · 23 acquisitions, 17 frames shown]
[im 1/23]
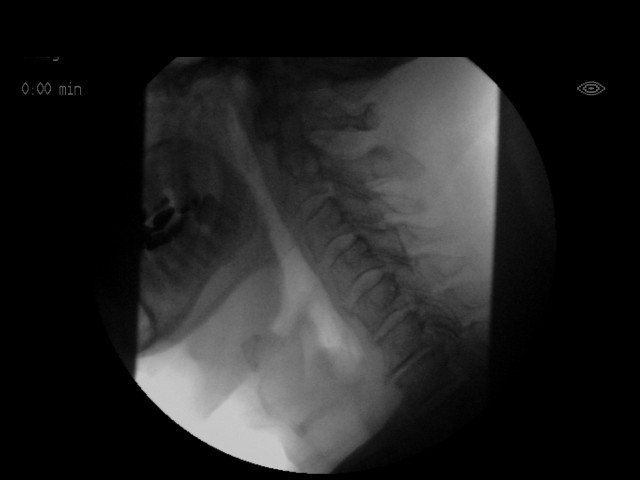
[im 3/23]
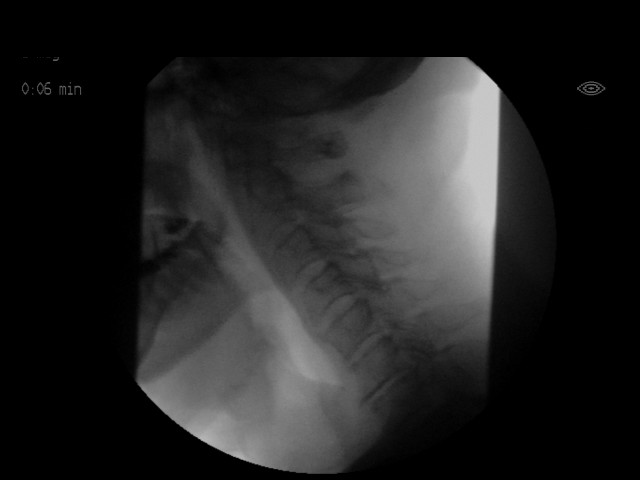
[im 4/23]
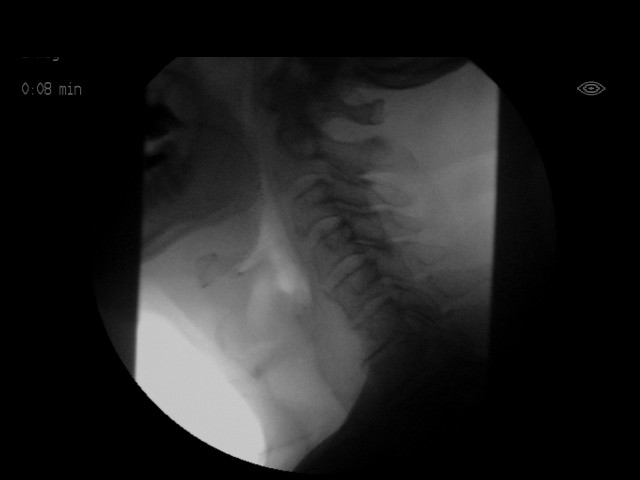
[im 5/23]
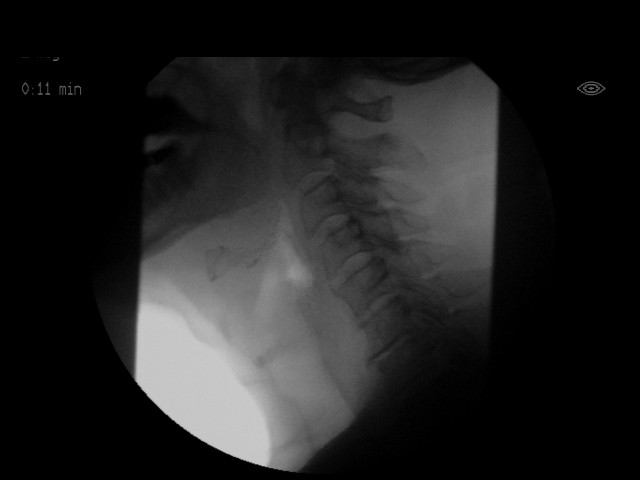
[im 7/23]
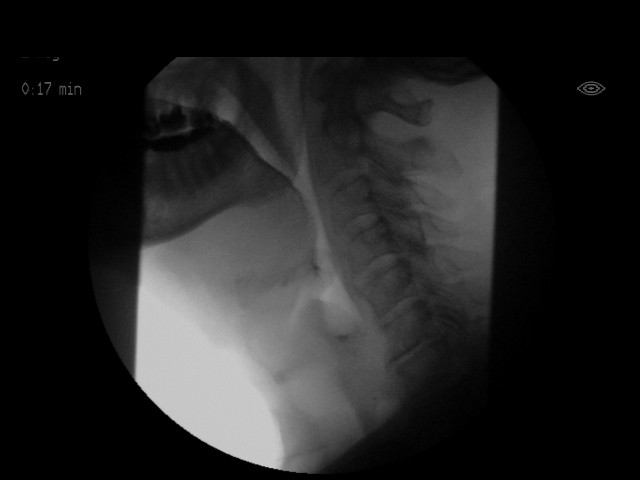
[im 8/23]
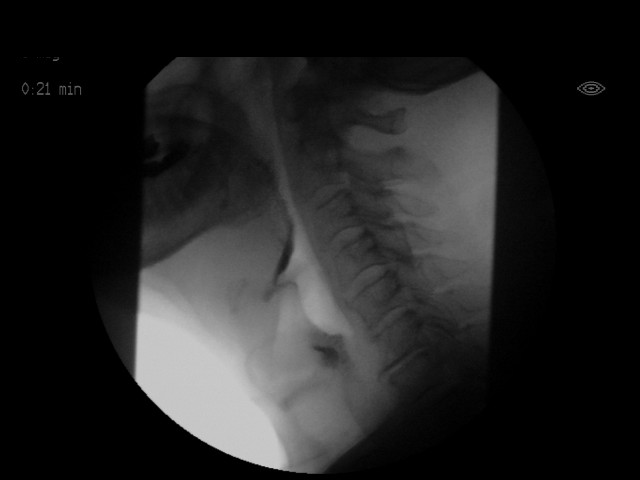
[im 10/23]
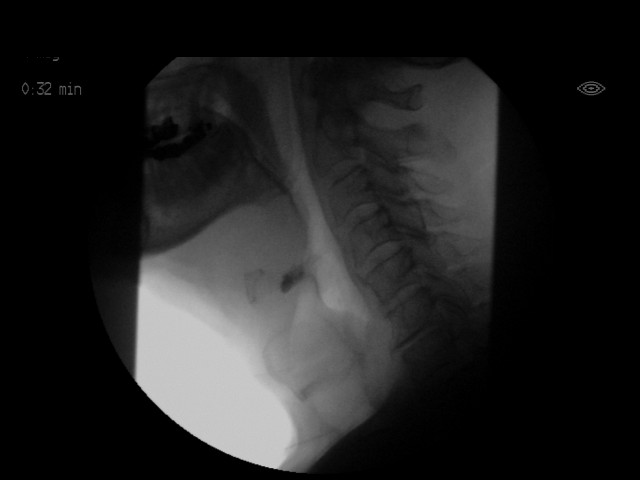
[im 11/23]
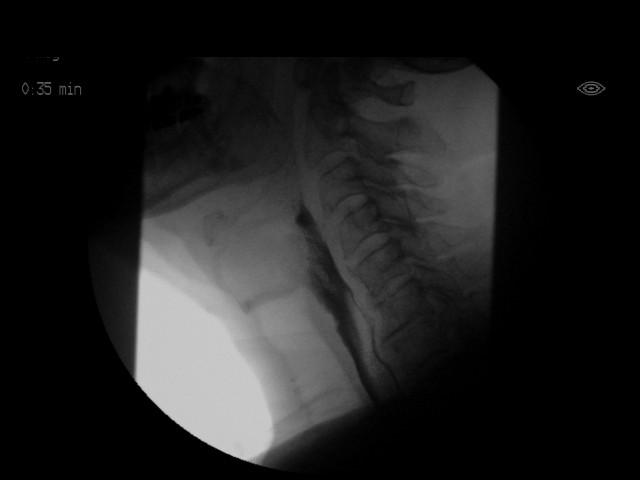
[im 12/23]
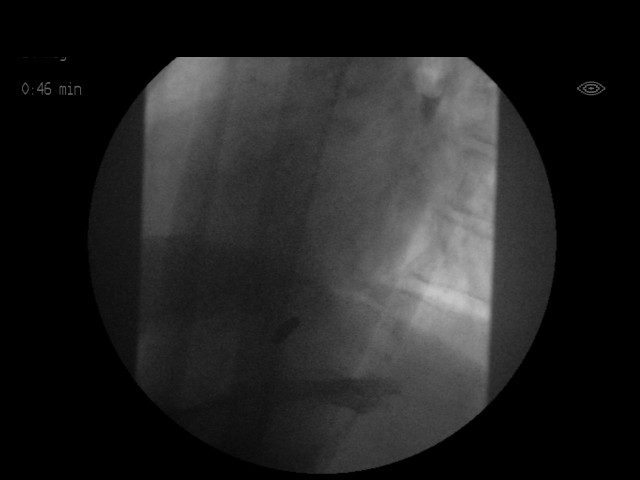
[im 13/23]
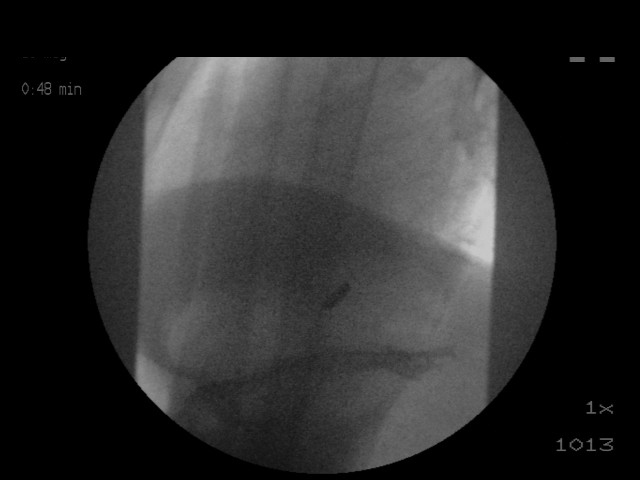
[im 14/23]
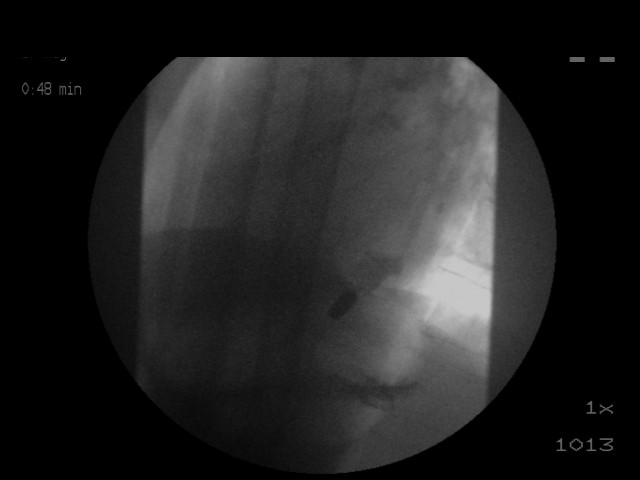
[im 16/23]
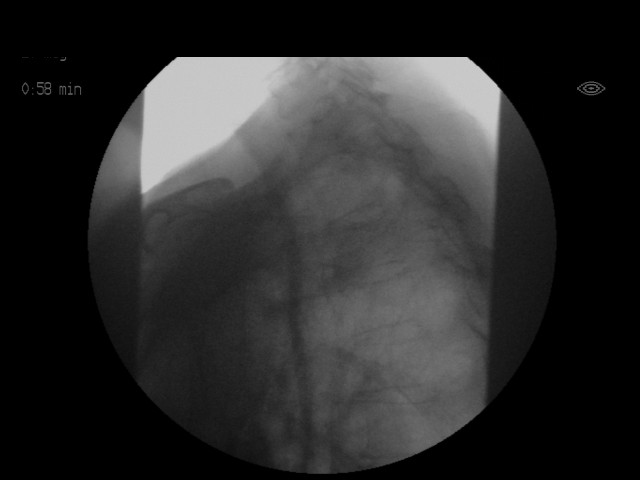
[im 17/23]
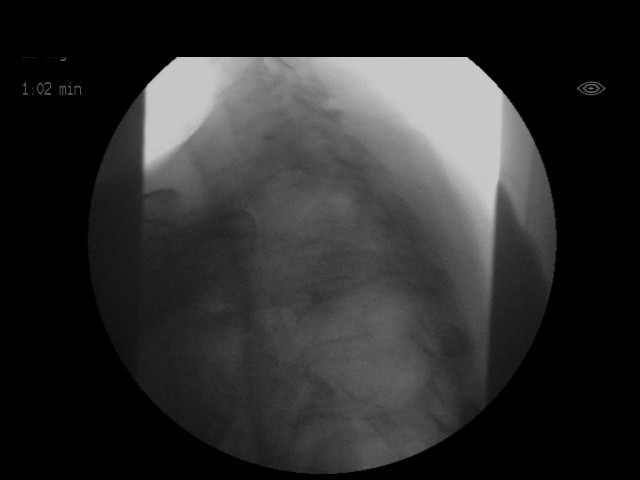
[im 19/23]
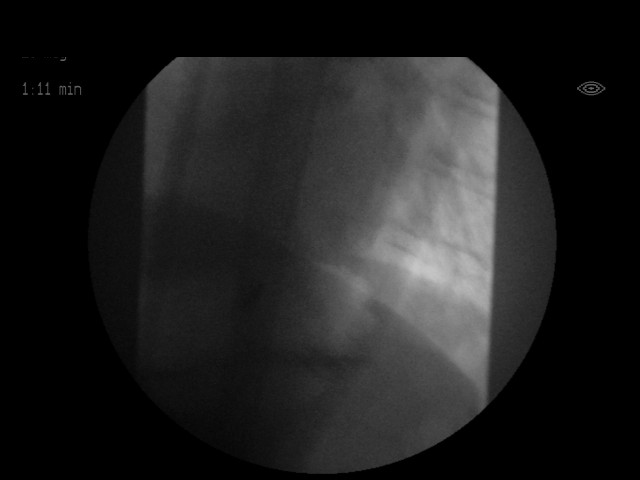
[im 20/23]
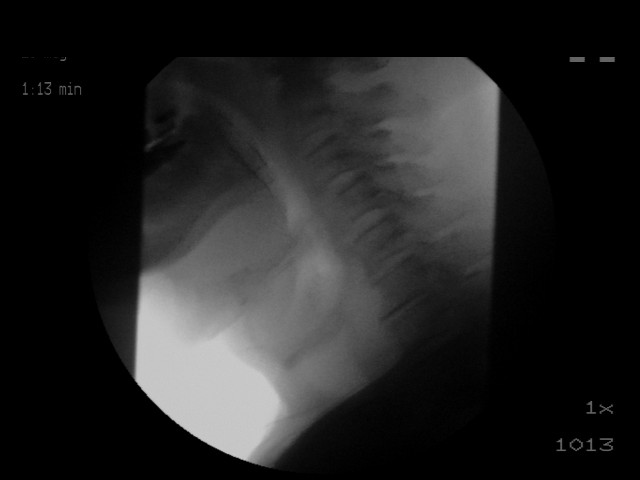
[im 21/23]
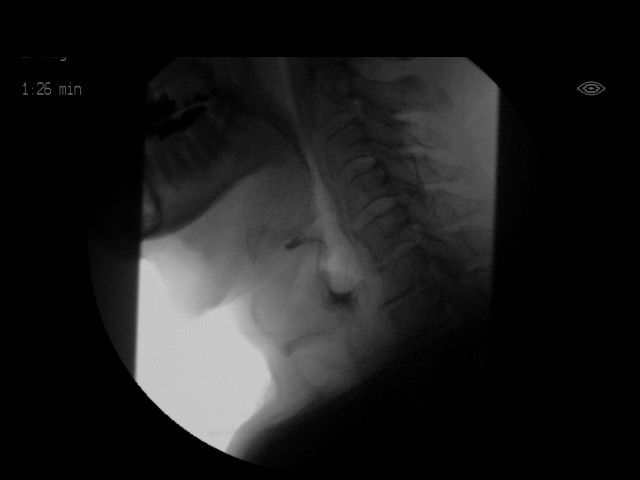
[im 23/23]
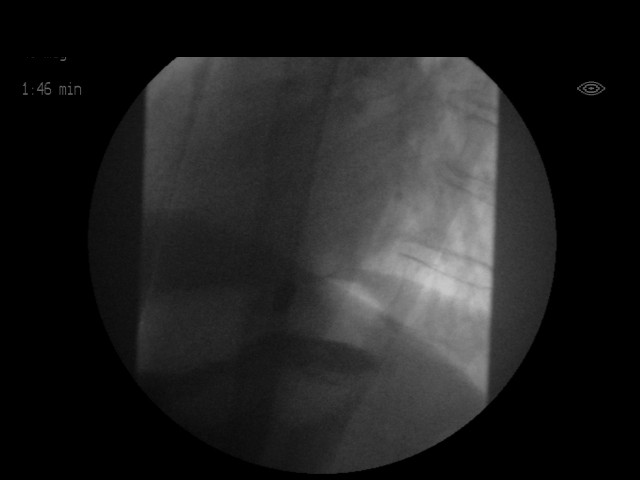

[17 of 24 positions shown; findings below may reference images not displayed]

FINDINGS: Thin liquid- flash penetration without tracheobronchial aspiration

BLUM?BLUM with cracker- within normal limits

Barium tablet -  stuck at the GE junction
IMPRESSION: Flash penetration of thin liquid, without tracheobronchial
aspiration.

13 mm barium tablet lodged at the GE junction, suggesting a fixed
narrowing/stricture at this location. Consider endoscopy with
dilatation as clinically warranted.

Please refer to the Speech Pathologists report for complete details
and recommendations.

## 2016-06-13 NOTE — Progress Notes (Signed)
Modified Barium Swallow Progress Note  Patient Details  Name: Tommy Peterson MRN: [id number] Date of Birth: 12/02/1940  Today's Date: 06/13/2016  Modified Barium Swallow completed.  Full report located under Chart Review in the Imaging Section.  Brief recommendations include the following:  Clinical Impression  A primary esophageal dysphagia is suspected. MBS does not diagnose below the level of the upper esophageal sphincter however esophagus was scanned revealing barium pill lodging at the GE junction (as dictated in radiologist's note). Laryngeal penetration, mostly flash (exited vestibule during swallow) with min sensation. Smaller sips prevented penetration during study. Mild vallecular and pyriform sinus residue during trials x 2. Pt verbalized pharyngeal global sensation without pharyngeal residue present (SLP explained likely referred sensation from esophagus). Pt educated via verbal and written precautions for esophagus. Recommend regular texture (use caution with meats and breads), thin liquids and follow up with GI for evaluation of esophagus.       Swallow Evaluation Recommendations   Recommended Consults: Consider GI evaluation   SLP Diet Recommendations: Thin liquid;Regular solids   Liquid Administration via: Cup   Medication Administration: Whole meds with liquid   Supervision: Patient able to self feed   Compensations: Slow rate;Small sips/bites;Follow solids with liquid (esophageal precautions)   Postural Changes: Remain semi-upright after after feeds/meals (Comment);Seated upright at 90 degrees   Oral Care Recommendations: Oral care BID        Houston Siren 06/13/2016,2:17 PM  Orbie Pyo [place].Ed [employer] [phone]

## 2016-08-07 HISTORY — PX: SHOULDER SURGERY: SHX246

## 2016-08-08 DIAGNOSIS — M75112 Incomplete rotator cuff tear or rupture of left shoulder, not specified as traumatic: Secondary | ICD-10-CM | POA: Diagnosis not present

## 2016-08-08 DIAGNOSIS — M24112 Other articular cartilage disorders, left shoulder: Secondary | ICD-10-CM | POA: Diagnosis not present

## 2016-08-08 DIAGNOSIS — M19012 Primary osteoarthritis, left shoulder: Secondary | ICD-10-CM | POA: Diagnosis not present

## 2016-08-08 DIAGNOSIS — G8918 Other acute postprocedural pain: Secondary | ICD-10-CM | POA: Diagnosis not present

## 2016-08-08 DIAGNOSIS — M7542 Impingement syndrome of left shoulder: Secondary | ICD-10-CM | POA: Diagnosis not present

## 2016-08-08 DIAGNOSIS — M659 Synovitis and tenosynovitis, unspecified: Secondary | ICD-10-CM | POA: Diagnosis not present

## 2016-08-08 DIAGNOSIS — S43432A Superior glenoid labrum lesion of left shoulder, initial encounter: Secondary | ICD-10-CM | POA: Diagnosis not present

## 2016-08-08 DIAGNOSIS — S46002A Unspecified injury of muscle(s) and tendon(s) of the rotator cuff of left shoulder, initial encounter: Secondary | ICD-10-CM | POA: Diagnosis not present

## 2016-08-08 DIAGNOSIS — M94212 Chondromalacia, left shoulder: Secondary | ICD-10-CM | POA: Diagnosis not present

## 2016-08-15 DIAGNOSIS — C44319 Basal cell carcinoma of skin of other parts of face: Secondary | ICD-10-CM | POA: Diagnosis not present

## 2016-08-15 DIAGNOSIS — D489 Neoplasm of uncertain behavior, unspecified: Secondary | ICD-10-CM | POA: Diagnosis not present

## 2016-08-15 DIAGNOSIS — D0439 Carcinoma in situ of skin of other parts of face: Secondary | ICD-10-CM | POA: Diagnosis not present

## 2016-08-15 DIAGNOSIS — Z85828 Personal history of other malignant neoplasm of skin: Secondary | ICD-10-CM | POA: Diagnosis not present

## 2016-08-15 DIAGNOSIS — L570 Actinic keratosis: Secondary | ICD-10-CM | POA: Diagnosis not present

## 2016-08-15 DIAGNOSIS — C4491 Basal cell carcinoma of skin, unspecified: Secondary | ICD-10-CM

## 2016-08-15 HISTORY — DX: Basal cell carcinoma of skin, unspecified: C44.91

## 2016-08-21 ENCOUNTER — Emergency Department (HOSPITAL_COMMUNITY)
Admission: EM | Admit: 2016-08-21 | Discharge: 2016-08-21 | Disposition: A | Discharge status: Home / Self Care | Payer: Medicare HMO | Attending: Emergency Medicine | Admitting: Emergency Medicine

## 2016-08-21 ENCOUNTER — Encounter (HOSPITAL_COMMUNITY): Payer: Self-pay | Admitting: *Deleted

## 2016-08-21 ENCOUNTER — Ambulatory Visit (INDEPENDENT_AMBULATORY_CARE_PROVIDER_SITE_OTHER): Payer: Medicare HMO | Admitting: Emergency Medicine

## 2016-08-21 VITALS — BP 126/76 | HR 114 | Temp 98.4°F | Resp 16 | Ht 69.0 in | Wt 156.0 lb

## 2016-08-21 DIAGNOSIS — Z7982 Long term (current) use of aspirin: Secondary | ICD-10-CM | POA: Insufficient documentation

## 2016-08-21 DIAGNOSIS — E1165 Type 2 diabetes mellitus with hyperglycemia: Secondary | ICD-10-CM | POA: Diagnosis not present

## 2016-08-21 DIAGNOSIS — Z87891 Personal history of nicotine dependence: Secondary | ICD-10-CM | POA: Diagnosis not present

## 2016-08-21 DIAGNOSIS — Z794 Long term (current) use of insulin: Secondary | ICD-10-CM | POA: Diagnosis not present

## 2016-08-21 DIAGNOSIS — D649 Anemia, unspecified: Secondary | ICD-10-CM | POA: Diagnosis not present

## 2016-08-21 DIAGNOSIS — R404 Transient alteration of awareness: Secondary | ICD-10-CM | POA: Diagnosis not present

## 2016-08-21 DIAGNOSIS — R300 Dysuria: Secondary | ICD-10-CM | POA: Diagnosis not present

## 2016-08-21 DIAGNOSIS — R824 Acetonuria: Secondary | ICD-10-CM

## 2016-08-21 DIAGNOSIS — E119 Type 2 diabetes mellitus without complications: Secondary | ICD-10-CM | POA: Insufficient documentation

## 2016-08-21 DIAGNOSIS — N390 Urinary tract infection, site not specified: Secondary | ICD-10-CM

## 2016-08-21 DIAGNOSIS — R531 Weakness: Secondary | ICD-10-CM | POA: Diagnosis not present

## 2016-08-21 DIAGNOSIS — D72829 Elevated white blood cell count, unspecified: Secondary | ICD-10-CM

## 2016-08-21 DIAGNOSIS — Z79899 Other long term (current) drug therapy: Secondary | ICD-10-CM | POA: Diagnosis not present

## 2016-08-21 DIAGNOSIS — R5383 Other fatigue: Secondary | ICD-10-CM | POA: Diagnosis not present

## 2016-08-21 DIAGNOSIS — R739 Hyperglycemia, unspecified: Secondary | ICD-10-CM

## 2016-08-21 LAB — URINALYSIS, ROUTINE W REFLEX MICROSCOPIC
BILIRUBIN URINE: NEGATIVE
Bacteria, UA: NONE SEEN
HGB URINE DIPSTICK: NEGATIVE
Ketones, ur: 5 mg/dL — AB
NITRITE: NEGATIVE
PROTEIN: NEGATIVE mg/dL
Specific Gravity, Urine: 1.028 (ref 1.005–1.030)
Squamous Epithelial / LPF: NONE SEEN
pH: 5 (ref 5.0–8.0)

## 2016-08-21 LAB — COMPREHENSIVE METABOLIC PANEL
ALT: 10 U/L — ABNORMAL LOW (ref 17–63)
ANION GAP: 7 (ref 5–15)
AST: 11 U/L — ABNORMAL LOW (ref 15–41)
Albumin: 3.2 g/dL — ABNORMAL LOW (ref 3.5–5.0)
Alkaline Phosphatase: 72 U/L (ref 38–126)
BUN: 12 mg/dL (ref 6–20)
CHLORIDE: 102 mmol/L (ref 101–111)
CO2: 24 mmol/L (ref 22–32)
CREATININE: 0.4 mg/dL — AB (ref 0.61–1.24)
Calcium: 8.3 mg/dL — ABNORMAL LOW (ref 8.9–10.3)
Glucose, Bld: 242 mg/dL — ABNORMAL HIGH (ref 65–99)
POTASSIUM: 3.8 mmol/L (ref 3.5–5.1)
SODIUM: 133 mmol/L — AB (ref 135–145)
Total Bilirubin: 0.8 mg/dL (ref 0.3–1.2)
Total Protein: 6.3 g/dL — ABNORMAL LOW (ref 6.5–8.1)

## 2016-08-21 LAB — POCT CBC
GRANULOCYTE PERCENT: 67.8 % (ref 37–80)
HCT, POC: 31.4 % — AB (ref 43.5–53.7)
HEMOGLOBIN: 10.9 g/dL — AB (ref 14.1–18.1)
Lymph, poc: 8.3 — AB (ref 0.6–3.4)
MCH: 37.5 pg — AB (ref 27–31.2)
MCHC: 34.7 g/dL (ref 31.8–35.4)
MCV: 108 fL — AB (ref 80–97)
MID (CBC): 0.5 (ref 0–0.9)
MPV: 7.1 fL (ref 0–99.8)
PLATELET COUNT, POC: 412 10*3/uL (ref 142–424)
POC Granulocyte: 18.4 — AB (ref 2–6.9)
POC LYMPH PERCENT: 30.4 %L (ref 10–50)
POC MID %: 1.8 %M (ref 0–12)
RBC: 2.91 M/uL — AB (ref 4.69–6.13)
RDW, POC: 20.8 %
WBC: 27.2 10*3/uL — AB (ref 4.6–10.2)

## 2016-08-21 LAB — I-STAT CG4 LACTIC ACID, ED: Lactic Acid, Venous: 0.78 mmol/L (ref 0.5–1.9)

## 2016-08-21 LAB — CBC WITH DIFFERENTIAL/PLATELET
BASOS PCT: 0 %
Basophils Absolute: 0 10*3/uL (ref 0.0–0.1)
EOS ABS: 0.2 10*3/uL (ref 0.0–0.7)
EOS PCT: 1 %
HCT: 29 % — ABNORMAL LOW (ref 39.0–52.0)
Hemoglobin: 9.4 g/dL — ABNORMAL LOW (ref 13.0–17.0)
LYMPHS ABS: 4.9 10*3/uL — AB (ref 0.7–4.0)
Lymphocytes Relative: 22 %
MCH: 34.6 pg — AB (ref 26.0–34.0)
MCHC: 32.4 g/dL (ref 30.0–36.0)
MCV: 106.6 fL — AB (ref 78.0–100.0)
MONO ABS: 1.8 10*3/uL — AB (ref 0.1–1.0)
Monocytes Relative: 8 %
Neutro Abs: 15.3 10*3/uL — ABNORMAL HIGH (ref 1.7–7.7)
Neutrophils Relative %: 69 %
PLATELETS: 467 10*3/uL — AB (ref 150–400)
RBC: 2.72 MIL/uL — ABNORMAL LOW (ref 4.22–5.81)
RDW: 19.2 % — AB (ref 11.5–15.5)
WBC: 22.2 10*3/uL — AB (ref 4.0–10.5)

## 2016-08-21 LAB — I-STAT CHEM 8, ED
BUN: 11 mg/dL (ref 6–20)
CALCIUM ION: 1.09 mmol/L — AB (ref 1.15–1.40)
CHLORIDE: 100 mmol/L — AB (ref 101–111)
Creatinine, Ser: 0.4 mg/dL — ABNORMAL LOW (ref 0.61–1.24)
GLUCOSE: 237 mg/dL — AB (ref 65–99)
HCT: 29 % — ABNORMAL LOW (ref 39.0–52.0)
Hemoglobin: 9.9 g/dL — ABNORMAL LOW (ref 13.0–17.0)
Potassium: 3.8 mmol/L (ref 3.5–5.1)
SODIUM: 136 mmol/L (ref 135–145)
TCO2: 23 mmol/L (ref 0–100)

## 2016-08-21 LAB — POCT URINALYSIS DIP (MANUAL ENTRY)
BILIRUBIN UA: NEGATIVE
Nitrite, UA: NEGATIVE
SPEC GRAV UA: 1.02
Urobilinogen, UA: 0.2
pH, UA: 5.5

## 2016-08-21 LAB — CBG MONITORING, ED: GLUCOSE-CAPILLARY: 238 mg/dL — AB (ref 65–99)

## 2016-08-21 LAB — GLUCOSE, POCT (MANUAL RESULT ENTRY): POC Glucose: 348 mg/dl — AB (ref 70–99)

## 2016-08-21 MED ORDER — CEFTRIAXONE SODIUM 1 G IJ SOLR
1.0000 g | Freq: Once | INTRAMUSCULAR | Status: AC
  Administered 2016-08-21: 1 g via INTRAVENOUS
  Filled 2016-08-21: qty 10

## 2016-08-21 MED ORDER — SODIUM CHLORIDE 0.9 % IV BOLUS (SEPSIS)
1000.0000 mL | INTRAVENOUS | Status: AC
  Administered 2016-08-21: 1000 mL via INTRAVENOUS

## 2016-08-21 MED ORDER — CEPHALEXIN 500 MG PO CAPS: 500.0000 mg | ORAL_CAPSULE | Freq: Two times a day (BID) | ORAL | 0 refills | Status: DC

## 2016-08-21 NOTE — ED Triage Notes (Signed)
Per EMS pt c/o generalized weakness x 3 days, was seen at the [facility]gent care and sent to ER due to elevated CBG (340). Per EMS pt has a hx of blood disorder and reports pain in L shoulder (rotator cuff injury)

## 2016-08-21 NOTE — ED Notes (Signed)
Pt is now consenting to have rocephin

## 2016-08-21 NOTE — ED Notes (Signed)
This RN spoke with Claiborne Billings PA and reviewed pt's visit notes from October. Pt was informed of this, and that rocephin should be safe. Pt still stating he does not want to take antibiotic until he finds a note which he believes he has in his wallet stating which antibiotics are safe for him to have

## 2016-08-21 NOTE — Patient Instructions (Addendum)
  To ER now.   IF you received an x-ray today, you will receive an invoice from [facility]. Please contact [facility] at [phone] with questions or concerns regarding your invoice.   IF you received labwork today, you will receive an invoice from [place]. Please contact [facility] at [phone] with questions or concerns regarding your invoice.   Our billing staff will not be able to assist you with questions regarding bills from these companies.  You will be contacted with the lab results as soon as they are available. The fastest way to get your results is to activate your My Chart account. Instructions are located on the last page of this paperwork. If you have not heard from [place] regarding the results in 2 weeks, please contact this office.

## 2016-08-21 NOTE — ED Notes (Signed)
Pt states he does not want to take rocephin until this RN is able to verify it is not the "antibiotic that caused him to have C Dif in October"

## 2016-08-21 NOTE — Progress Notes (Signed)
Tommy Peterson 76 y.o.   Chief Complaint  Patient presents with  . Urinary Frequency    x 1-2 weeks  . Fatigue  . urinary pain    HISTORY OF PRESENT ILLNESS: This is a 76 y.o. male complaining of feeling sick x 3 days.  Urinary Frequency   This is a new problem. The current episode started in the past 7 days. The problem occurs intermittently. The problem has been gradually worsening. The pain is at a severity of 0/10. The patient is experiencing no pain. There has been no fever. Associated symptoms include chills, frequency, nausea and urgency. Pertinent negatives include no discharge, flank pain, hematuria or vomiting. He has tried nothing for the symptoms.     Prior to Admission medications   Medication Sig Start Date End Date Taking? Authorizing Provider  aspirin 81 MG tablet Take 81 mg by mouth daily.   Yes Historical Provider, MD  budesonide (ENTOCORT EC) 3 MG 24 hr capsule Take 6 mg by mouth daily.   Yes Historical Provider, MD  LANTUS SOLOSTAR 100 UNIT/ML Solostar Pen Inject 24 Units into the skin at bedtime.  01/05/14  Yes Historical Provider, MD  oxycodone (OXY-IR) 5 MG capsule Take 5 mg by mouth every 6 (six) hours as needed for pain.   Yes Historical Provider, MD  traMADol (ULTRAM) 50 MG tablet Take 50 mg by mouth daily. 05/26/16  Yes Historical Provider, MD  triamcinolone cream (KENALOG) 0.1 % Apply 1 application topically 2 (two) times daily. Patient taking differently: Apply 1 application topically 2 (two) times daily as needed (for basel cell spots on forehead).  02/14/16  Yes Jaynee Eagles, PA-C  vancomycin (VANCOCIN) 50 mg/mL oral solution Take 2.5 mLs (125 mg total) by mouth every 6 (six) hours. 06/01/16  Yes Orson Eva, MD  potassium chloride SA (K-DUR,KLOR-CON) 20 MEQ tablet Take 2 tablets (40 mEq total) by mouth once. 06/02/16 06/02/16  Orson Eva, MD    Allergies  Allergen Reactions  . Bee Venom Swelling  . Metformin And Related Nausea Only    Patient Active  Problem List   Diagnosis Date Noted  . C. difficile colitis 05/31/2016  . Lactic acidosis   . History of bone marrow biopsy 05/27/2016  . Fever 05/27/2016  . Encephalopathy in sepsis 05/27/2016  . Sepsis ([place]) 05/27/2016  . Chest pain 05/04/2016  . Benign fibroma of prostate 09/20/2015  . HLD (hyperlipidemia) 09/20/2015  . BP (high blood pressure) 09/20/2015  . Arthritis, degenerative 09/20/2015  . Colitis, collagenous 09/09/2015  . Absolute anemia 09/08/2014  . Adenoma of large intestine 07/13/2014  . Disturbance of skin sensation 01/09/2014  . Diabetes ([place]) 06/24/2013  . Recurrent major depressive disorder, in full remission ([place]) 07/18/2012  . Allergic rhinitis 08/29/2011    Past Medical History:  Diagnosis Date  . Anemia   . Cataract   . Diabetes mellitus without complication ([place])   . Macrocytosis   . Monoclonal gammopathy     Past Surgical History:  Procedure Laterality Date  . BONE MARROW BIOPSY Right 2016  . CATARACT EXTRACTION     bilateral  . HERNIA REPAIR     right inguinal  . PENILE PROSTHESIS IMPLANT    . PROSTATE SURGERY     BPH  . TONSILLECTOMY      Social History   Social History  . Marital status: Significant Other    Spouse name: engaged  . Number of children: 8  . Years of education: college-2   Occupational History  .  Sales   . [employer]    Social History Main Topics  . Smoking status: Former Smoker    Types: Cigarettes    Quit date: 05/04/1996  . Smokeless tobacco: Never Used     Comment: Smoked for 20 years  . Alcohol use Yes     Comment: 1-2 glasses of wine every other day  . Drug use: No  . Sexual activity: No   Other Topics Concern  . Not on file   Social History Narrative  . No narrative on file    Family History  Problem Relation Age of Onset  . Diabetes Mother   . Heart failure Mother      Review of Systems  Constitutional: Positive for chills and malaise/fatigue. Negative for diaphoresis and  fever.  HENT: Negative for congestion, nosebleeds, sinus pain and sore throat.   Eyes: Negative for discharge and redness.  Respiratory: Negative for cough, hemoptysis, shortness of breath and wheezing.   Cardiovascular: Negative for chest pain and palpitations.  Gastrointestinal: Positive for nausea. Negative for vomiting.  Genitourinary: Positive for frequency and urgency. Negative for flank pain and hematuria.  Musculoskeletal: Positive for joint pain (recent left shoulder surgery).  Skin: Negative for rash.  Neurological: Positive for weakness (general). Negative for dizziness and headaches.  Endo/Heme/Allergies: Negative.   Psychiatric/Behavioral: Negative.   All other systems reviewed and are negative.  Vitals:   08/21/16 1218  BP: 126/76  Pulse: (!) 114  Resp: 16  Temp: 98.4 F (36.9 C)     Physical Exam  Constitutional: He is oriented to person, place, and time. He appears well-developed and well-nourished.  Ill-looking  HENT:  Head: Normocephalic and atraumatic.  Nose: Nose normal.  Mouth/Throat: Oropharynx is clear and moist.  Pale lips and conjunctivae.  Eyes: Conjunctivae and EOM are normal. Pupils are equal, round, and reactive to light.  Neck: Normal range of motion. Neck supple. No thyromegaly present.  Cardiovascular: Regular rhythm and normal heart sounds.   tachycardic  Pulmonary/Chest: Effort normal and breath sounds normal.  Abdominal: Soft. Bowel sounds are normal. He exhibits no distension. There is no tenderness. There is no guarding.  Musculoskeletal: Normal range of motion.  Lymphadenopathy:    He has no cervical adenopathy.  Neurological: He is alert and oriented to person, place, and time. He displays normal reflexes. No sensory deficit. He exhibits normal muscle tone.  Skin: Skin is dry. Capillary refill takes less than 2 seconds.  pale  Psychiatric: He has a normal mood and affect. His behavior is normal.  Vitals reviewed.    ASSESSMENT &  PLAN: To ER now. r/o DKA/sepsis Josep was seen today for urinary frequency, fatigue and urinary pain.  Diagnoses and all orders for this visit:  Hyperglycemia  Dysuria -     POCT urinalysis dipstick -     Urine culture -     POCT CBC -     POCT glucose (manual entry)  Anemia, unspecified type  Leukocytosis, unspecified type  Ketonuria  Type 2 diabetes mellitus with hyperglycemia, with long-term current use of insulin ([place])    .pa Agustina Caroli, MD [facility]

## 2016-08-21 NOTE — ED Provider Notes (Signed)
[facility] DEPT Provider Note   CSN: [id number] Arrival date & time: 08/21/16  1424    History   Chief Complaint Chief Complaint  Patient presents with  . Hyperglycemia    HPI Tommy Peterson is a 76 y.o. male.  76 year old male with a history of diabetes mellitus, anemia, and macrocytosis presents to the emergency department for evaluation of generalized weakness and fatigue. Symptoms have been worsening over the last 3 days. He reports noticing some sporadic lightheadedness, worse with position change. He has also had polyuria, voiding 12 times throughout the night 2 days ago. He reports a burning dysuria at the end of voiding. He has not had any hematuria. He has been feeling increasingly thirsty lately and has been hydrating with water. CBG at [facility] urgent care was 340 today. He has had multiple transfusions in the past for anemia. He reports a history of leukocytosis to approximately 27. No recent steroid use. No known fevers. No vomiting or diarrhea.      Past Medical History:  Diagnosis Date  . Anemia   . Cataract   . Diabetes mellitus without complication ([place])   . Macrocytosis   . Monoclonal gammopathy     Patient Active Problem List   Diagnosis Date Noted  . C. difficile colitis 05/31/2016  . Lactic acidosis   . History of bone marrow biopsy 05/27/2016  . Fever 05/27/2016  . Encephalopathy in sepsis 05/27/2016  . Sepsis ([place]) 05/27/2016  . Chest pain 05/04/2016  . Benign fibroma of prostate 09/20/2015  . HLD (hyperlipidemia) 09/20/2015  . BP (high blood pressure) 09/20/2015  . Arthritis, degenerative 09/20/2015  . Colitis, collagenous 09/09/2015  . Absolute anemia 09/08/2014  . Adenoma of large intestine 07/13/2014  . Disturbance of skin sensation 01/09/2014  . Diabetes ([place]) 06/24/2013  . Recurrent major depressive disorder, in full remission ([place]) 07/18/2012  . Allergic rhinitis 08/29/2011    Past Surgical History:  Procedure Laterality Date  .  BONE MARROW BIOPSY Right 2016  . CATARACT EXTRACTION     bilateral  . HERNIA REPAIR     right inguinal  . PENILE PROSTHESIS IMPLANT    . PROSTATE SURGERY     BPH  . TONSILLECTOMY         Home Medications    Prior to Admission medications   Medication Sig Start Date End Date Taking? Authorizing Provider  aspirin 81 MG tablet Take 81 mg by mouth daily.   Yes Historical Provider, MD  budesonide (ENTOCORT EC) 3 MG 24 hr capsule Take 6 mg by mouth daily.   Yes Historical Provider, MD  diazepam (VALIUM) 5 MG tablet Take 5 mg by mouth at bedtime as needed for sleep or muscle spasms. 08/08/16  Yes Historical Provider, MD  LANTUS SOLOSTAR 100 UNIT/ML Solostar Pen Inject 24 Units into the skin at bedtime.  01/05/14  Yes Historical Provider, MD  naproxen (NAPROSYN) 500 MG tablet Take 500 mg by mouth 2 (two) times daily after a meal. 08/08/16  Yes Historical Provider, MD  ondansetron (ZOFRAN) 4 MG tablet Take 4 mg by mouth 4 (four) times daily as needed for nausea/vomiting. 08/08/16  Yes Historical Provider, MD  oxyCODONE-acetaminophen (PERCOCET/ROXICET) 5-325 MG tablet Take 1 tablet by mouth every 6 (six) hours as needed for pain. 08/08/16  Yes Historical Provider, MD  triamcinolone cream (KENALOG) 0.1 % Apply 1 application topically 2 (two) times daily. Patient taking differently: Apply 1 application topically 2 (two) times daily as needed (for basel cell spots  on forehead).  02/14/16  Yes Jaynee Eagles, PA-C  cephALEXin (KEFLEX) 500 MG capsule Take 1 capsule (500 mg total) by mouth 2 (two) times daily. 08/21/16   Antonietta Breach, PA-C  potassium chloride SA (K-DUR,KLOR-CON) 20 MEQ tablet Take 2 tablets (40 mEq total) by mouth once. 06/02/16 06/02/16  Orson Eva, MD  vancomycin (VANCOCIN) 50 mg/mL oral solution Take 2.5 mLs (125 mg total) by mouth every 6 (six) hours. Patient not taking: Reported on 08/21/2016 06/01/16   Orson Eva, MD    Family History Family History  Problem Relation Age of Onset  . Diabetes  Mother   . Heart failure Mother     Social History Social History  Substance Use Topics  . Smoking status: Former Smoker    Types: Cigarettes    Quit date: 05/04/1996  . Smokeless tobacco: Never Used     Comment: Smoked for 20 years  . Alcohol use Yes     Comment: 1-2 glasses of wine every other day     Allergies   Bee venom and Metformin and related   Review of Systems Review of Systems Ten systems reviewed and are negative for acute change, except as noted in the HPI.    Physical Exam Updated Vital Signs BP 135/73   Pulse 94   Temp 98.3 F (36.8 C) (Oral)   Resp 18   SpO2 91%   Physical Exam  Constitutional: He is oriented to person, place, and time. He appears well-developed and well-nourished. No distress.  Nontoxic and in NAD  HENT:  Head: Normocephalic and atraumatic.  Eyes: Conjunctivae and EOM are normal. No scleral icterus.  Neck: Normal range of motion.  Cardiovascular: Regular rhythm and intact distal pulses.   Tachycardia  Pulmonary/Chest: Effort normal. No respiratory distress. He has no wheezes. He has no rales.  Lungs CTAB  Abdominal: Soft. He exhibits no distension and no mass. There is no tenderness. There is no guarding.  Soft, nontender abdomen. No masses or rigidity.  Musculoskeletal: Normal range of motion.  Neurological: He is alert and oriented to person, place, and time. He exhibits normal muscle tone. Coordination normal.  Skin: Skin is warm and dry. No rash noted. He is not diaphoretic. No erythema. No pallor.  Psychiatric: He has a normal mood and affect. His behavior is normal.  Nursing note and vitals reviewed.    ED Treatments / Results  Labs (all labs ordered are listed, but only abnormal results are displayed) Labs Reviewed  CBC WITH DIFFERENTIAL/PLATELET - Abnormal; Notable for the following:       Result Value   WBC 22.2 (*)    RBC 2.72 (*)    Hemoglobin 9.4 (*)    HCT 29.0 (*)    MCV 106.6 (*)    MCH 34.6 (*)    RDW  19.2 (*)    Platelets 467 (*)    Neutro Abs 15.3 (*)    Lymphs Abs 4.9 (*)    Monocytes Absolute 1.8 (*)    All other components within normal limits  COMPREHENSIVE METABOLIC PANEL - Abnormal; Notable for the following:    Sodium 133 (*)    Glucose, Bld 242 (*)    Creatinine, Ser 0.40 (*)    Calcium 8.3 (*)    Total Protein 6.3 (*)    Albumin 3.2 (*)    AST 11 (*)    ALT 10 (*)    All other components within normal limits  URINALYSIS, ROUTINE W REFLEX MICROSCOPIC - Abnormal;  Notable for the following:    APPearance HAZY (*)    Glucose, UA >=500 (*)    Ketones, ur 5 (*)    Leukocytes, UA MODERATE (*)    All other components within normal limits  CBG MONITORING, ED - Abnormal; Notable for the following:    Glucose-Capillary 238 (*)    All other components within normal limits  I-STAT CHEM 8, ED - Abnormal; Notable for the following:    Chloride 100 (*)    Creatinine, Ser 0.40 (*)    Glucose, Bld 237 (*)    Calcium, Ion 1.09 (*)    Hemoglobin 9.9 (*)    HCT 29.0 (*)    All other components within normal limits  URINE CULTURE  CULTURE, BLOOD (ROUTINE X 2)  CULTURE, BLOOD (ROUTINE X 2)  I-STAT CG4 LACTIC ACID, ED    EKG  EKG Interpretation None       Radiology No results found.  Procedures Procedures (including critical care time)  Medications Ordered in ED Medications  sodium chloride 0.9 % bolus 1,000 mL (0 mLs Intravenous Stopped 08/21/16 1733)  cefTRIAXone (ROCEPHIN) 1 g in dextrose 5 % 50 mL IVPB (0 g Intravenous Stopped 08/21/16 1820)  sodium chloride 0.9 % bolus 1,000 mL (1,000 mLs Intravenous New Bag/Given 08/21/16 1734)     Initial Impression / Assessment and Plan / ED Course  I have reviewed the triage vital signs and the nursing notes.  Pertinent labs & imaging results that were available during my care of the patient were reviewed by me and considered in my medical decision making (see chart for details).  Clinical Course     76 year old male  percent to the emergency department for evaluation of fatigue. He was seen at urgent care and was transferred after he was found to have a high blood sugar. Patient does have hyperglycemia today, but no evidence of diabetic ketoacidosis. He has a normal anion gap. Patient afebrile. He does have a leukocytosis which is chronic. This appears to be at baseline. He has a normal lactic acid level.   Fatigue suspected secondary to urinary tract infection. Urine sent for culture and patient started on Rocephin. Given his history of C. difficile colitis after receiving ciprofloxacin, will place on Keflex for home management. The patient has been encouraged to follow up with his primary care doctor for repeat evaluation of his symptoms. Return precautions given at discharge. Patient agreeable to plan with no unaddressed concerns.   Final Clinical Impressions(s) / ED Diagnoses   Final diagnoses:  Acute lower UTI    New Prescriptions New Prescriptions   CEPHALEXIN (KEFLEX) 500 MG CAPSULE    Take 1 capsule (500 mg total) by mouth 2 (two) times daily.     Antonietta Breach, PA-C 08/21/16 1831    Dorie Rank, MD 08/21/16 2147

## 2016-08-25 LAB — URINE CULTURE

## 2016-08-26 ENCOUNTER — Telehealth: Payer: Self-pay

## 2016-08-26 LAB — CULTURE, BLOOD (ROUTINE X 2)
Culture: NO GROWTH
Culture: NO GROWTH

## 2016-08-26 NOTE — Progress Notes (Signed)
ED Antimicrobial Stewardship Positive Culture Follow Up   Tommy Peterson is an 76 y.o. male who presented to [facility] on 08/21/2016 with a chief complaint of  Chief Complaint  Patient presents with  . Hyperglycemia    Recent Results (from the past 720 hour(s))  Urine culture     Status: Abnormal (Preliminary result)   Collection Time: 08/21/16 12:48 PM  Result Value Ref Range Status   Urine Culture, Routine Preliminary report (A)  Preliminary   Urine Culture result 1 Enterococcus faecalis (A)  Preliminary    Comment: Greater than 100,000 colony forming units per mL   RESULT 2 Comment (A)  Preliminary    Comment: Escherichia coli, identified by an automated biochemical system. 25,000-50,000 colony forming units per mL Cefazolin <=4 ug/mL Cefazolin with an MIC <=16 predicts susceptibility to the oral agents cefaclor, cefdinir, cefpodoxime, cefprozil, cefuroxime, cephalexin, and loracarbef when used for therapy of uncomplicated urinary tract infections due to E. coli, Klebsiella pneumoniae, and Proteus mirabilis.    ANTIMICROBIAL SUSCEPTIBILITY Comment  Preliminary    Comment:       ** S = Susceptible; I = Intermediate; R = Resistant **                    P = Positive; N = Negative             MICS are expressed in micrograms per mL    Antibiotic                 RSLT#1    RSLT#2    RSLT#3    RSLT#4 Amoxicillin/Clavulanic Acid              S Ampicillin                               S Cefepime                                 S Ceftriaxone                              S Cefuroxime                               S Cephalothin                              S Ciprofloxacin                            S Ertapenem                                S Gentamicin                               S Imipenem                                 S Levofloxacin  S Nitrofurantoin                           S Piperacillin                             S Tetracycline                              S Tobramycin                               S Trimethoprim/Sulfa                       S   Urine culture     Status: Abnormal   Collection Time: 08/21/16  2:24 PM  Result Value Ref Range Status   Specimen Description URINE, RANDOM  Final   Special Requests NONE  Final   Culture >=100,000 COLONIES/mL ENTEROCOCCUS FAECALIS (A)  Final   Report Status 08/25/2016 FINAL  Final   Organism ID, Bacteria ENTEROCOCCUS FAECALIS (A)  Final      Susceptibility   Enterococcus faecalis - MIC*    AMPICILLIN <=2 SENSITIVE Sensitive     LEVOFLOXACIN 1 SENSITIVE Sensitive     NITROFURANTOIN <=16 SENSITIVE Sensitive     VANCOMYCIN 1 SENSITIVE Sensitive     * >=100,000 COLONIES/mL ENTEROCOCCUS FAECALIS  Blood culture (routine x 2)     Status: None (Preliminary result)   Collection Time: 08/21/16  5:30 PM  Result Value Ref Range Status   Specimen Description BLOOD RIGHT ANTECUBITAL  Final   Special Requests BOTTLES DRAWN AEROBIC AND ANAEROBIC 5 CC EACH  Final   Culture   Final    NO GROWTH 4 DAYS Performed at [facility], [address] [place], [place] [address]    Report Status PENDING  Incomplete  Blood culture (routine x 2)     Status: None (Preliminary result)   Collection Time: 08/21/16  5:51 PM  Result Value Ref Range Status   Specimen Description BLOOD BLOOD LEFT FOREARM  Final   Special Requests BOTTLES DRAWN AEROBIC AND ANAEROBIC 5 CC EACH  Final   Culture   Final    NO GROWTH 4 DAYS Performed at [facility], [place] [address], [place], [place] [address]    Report Status PENDING  Incomplete    [x]  Treated with cephalexin, organism resistant to prescribed antimicrobial []  Patient discharged originally without antimicrobial agent and treatment is now indicated  New antibiotic prescription: Amoxicillin 500 mg po BID x 10 days  ED Provider: Adolm Joseph, PA-C   Ura Hausen, Jake Church 08/26/2016, 9:32 AM

## 2016-08-26 NOTE — Telephone Encounter (Signed)
Post ED Visit - Positive Culture Follow-up: Successful Patient Follow-Up  Culture assessed and recommendations reviewed by: []  Elenor Quinones, Pharm.D. []  Heide Guile, Pharm.D., BCPS []  Parks Neptune, Pharm.D. []  Alycia Rossetti, Pharm.D., BCPS []  [place], [place].D., BCPS, AAHIVP []  Legrand Como, Pharm.D., BCPS, AAHIVP []  Cassie Stewart, Pharm.D. []  Stephens November, Pharm.D. Ebony Hail Masters Pharm D Positive urine culture  []  Patient discharged without antimicrobial prescription and treatment is now indicated [x]  Organism is resistant to prescribed ED discharge antimicrobial []  Patient with positive blood cultures  Changes discussed with ED provider: Lianne Bushy [facility]  New antibiotic prescription Amoxicillin 500 mg BID x 10 d Called to [facility] [phone]  Contacted patient, date 08/26/16, time 1015   Tommy Peterson, Carolynn Comment 08/26/2016, 10:13 AM

## 2016-08-28 DIAGNOSIS — D72828 Other elevated white blood cell count: Secondary | ICD-10-CM | POA: Diagnosis not present

## 2016-08-28 DIAGNOSIS — D7589 Other specified diseases of blood and blood-forming organs: Secondary | ICD-10-CM | POA: Diagnosis not present

## 2016-08-28 DIAGNOSIS — Z87891 Personal history of nicotine dependence: Secondary | ICD-10-CM | POA: Diagnosis not present

## 2016-08-28 DIAGNOSIS — D472 Monoclonal gammopathy: Secondary | ICD-10-CM | POA: Diagnosis not present

## 2016-08-28 DIAGNOSIS — D72820 Lymphocytosis (symptomatic): Secondary | ICD-10-CM | POA: Diagnosis not present

## 2016-08-28 DIAGNOSIS — D539 Nutritional anemia, unspecified: Secondary | ICD-10-CM | POA: Diagnosis not present

## 2016-08-28 DIAGNOSIS — D649 Anemia, unspecified: Secondary | ICD-10-CM | POA: Diagnosis not present

## 2016-08-28 LAB — URINE CULTURE

## 2016-09-05 ENCOUNTER — Ambulatory Visit (INDEPENDENT_AMBULATORY_CARE_PROVIDER_SITE_OTHER): Payer: Medicare HMO

## 2016-09-05 ENCOUNTER — Ambulatory Visit (HOSPITAL_COMMUNITY)
Admission: RE | Admit: 2016-09-05 | Discharge: 2016-09-05 | Disposition: A | Discharge status: Home / Self Care | Payer: Medicare HMO | Source: Ambulatory Visit | Attending: Family Medicine | Admitting: Family Medicine

## 2016-09-05 ENCOUNTER — Other Ambulatory Visit: Payer: Self-pay | Admitting: Family Medicine

## 2016-09-05 ENCOUNTER — Ambulatory Visit (INDEPENDENT_AMBULATORY_CARE_PROVIDER_SITE_OTHER): Payer: Medicare HMO | Admitting: Family Medicine

## 2016-09-05 VITALS — BP 130/68 | HR 93 | Temp 99.1°F | Resp 20 | Ht 69.5 in | Wt 155.6 lb

## 2016-09-05 DIAGNOSIS — R509 Fever, unspecified: Secondary | ICD-10-CM

## 2016-09-05 DIAGNOSIS — E079 Disorder of thyroid, unspecified: Secondary | ICD-10-CM | POA: Insufficient documentation

## 2016-09-05 DIAGNOSIS — R0602 Shortness of breath: Secondary | ICD-10-CM

## 2016-09-05 DIAGNOSIS — J439 Emphysema, unspecified: Secondary | ICD-10-CM | POA: Diagnosis not present

## 2016-09-05 DIAGNOSIS — R05 Cough: Secondary | ICD-10-CM | POA: Diagnosis not present

## 2016-09-05 DIAGNOSIS — R911 Solitary pulmonary nodule: Secondary | ICD-10-CM | POA: Insufficient documentation

## 2016-09-05 LAB — POCT CBC
Granulocyte percent: 48.8 %G (ref 37–80)
HCT, POC: 26.1 % — AB (ref 43.5–53.7)
HEMOGLOBIN: 9.2 g/dL — AB (ref 14.1–18.1)
LYMPH, POC: 7.1 — AB (ref 0.6–3.4)
MCH, POC: 38.2 pg — AB (ref 27–31.2)
MCHC: 35.4 g/dL (ref 31.8–35.4)
MCV: 107.8 fL — AB (ref 80–97)
MID (cbc): 1.5 — AB (ref 0–0.9)
MPV: 7.3 fL (ref 0–99.8)
POC Granulocyte: 8.1 — AB (ref 2–6.9)
POC LYMPH %: 42.3 % (ref 10–50)
POC MID %: 8.9 % (ref 0–12)
Platelet Count, POC: 483 10*3/uL — AB (ref 142–424)
RBC: 2.42 M/uL — AB (ref 4.69–6.13)
RDW, POC: 19.6 %
WBC: 16.7 10*3/uL — AB (ref 4.6–10.2)

## 2016-09-05 LAB — POCT INFLUENZA A/B
INFLUENZA A, POC: NEGATIVE
Influenza B, POC: NEGATIVE

## 2016-09-05 LAB — POCT SEDIMENTATION RATE: POCT SED RATE: 67 mm/h — AB (ref 0–22)

## 2016-09-05 LAB — GLUCOSE, POCT (MANUAL RESULT ENTRY): POC Glucose: 206 mg/dl — AB (ref 70–99)

## 2016-09-05 IMAGING — CT CT ANGIO CHEST
2 of 6 series · 18 of 46 positions shown · IV contrast (APPLIED)
Comparison: [DATE].

CLINICAL DATA: Shortness of breath and hypoxia

EXAM:
CT ANGIOGRAPHY CHEST WITH CONTRAST
TECHNIQUE: Multidetector CT imaging of the chest was performed using the
standard protocol during bolus administration of intravenous
contrast. Multiplanar CT image reconstructions and MIPs were
obtained to evaluate the vascular anatomy.
CONTRAST:  100 mL Isovue 370.

[Series 5: thins · axial · 0.76mm/px · z∈[-40,+194]mm · 16 of 258 slices shown]
[im 12/258  lung]
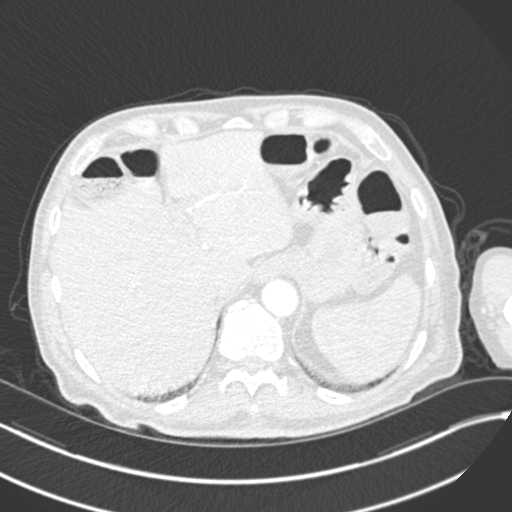
[im 34/258  soft-tissue]
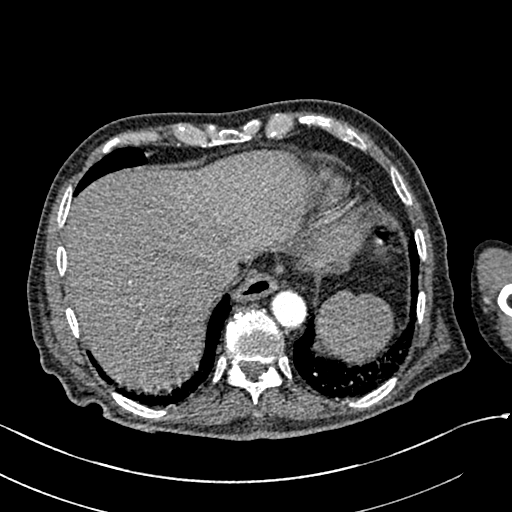
[im 45/258  lung]
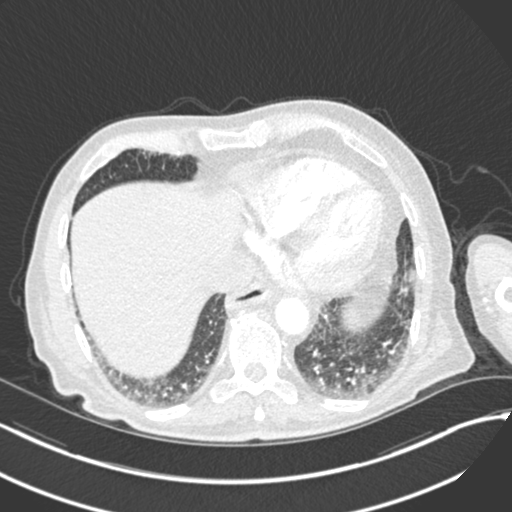
[im 56/258  soft-tissue]
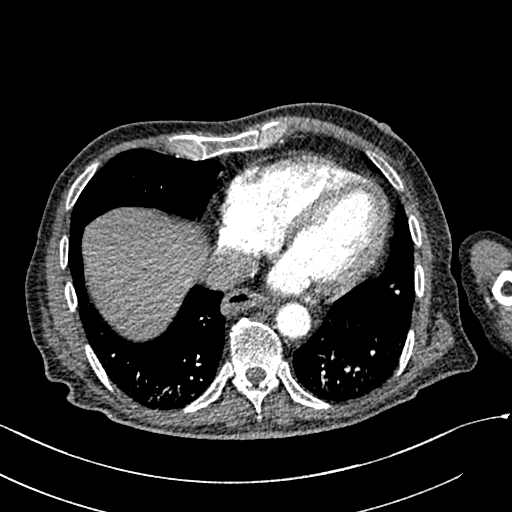
[im 79/258  lung]
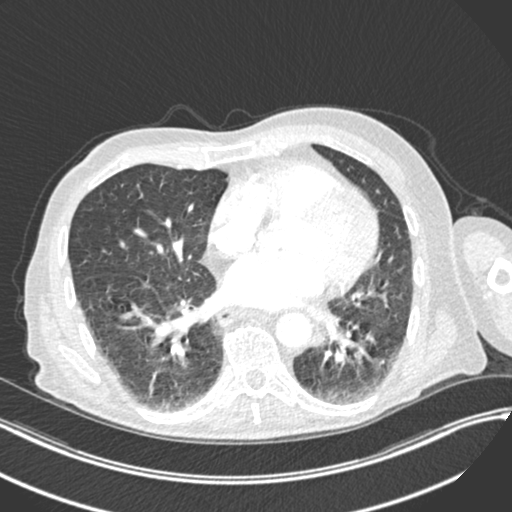
[im 90/258  soft-tissue]
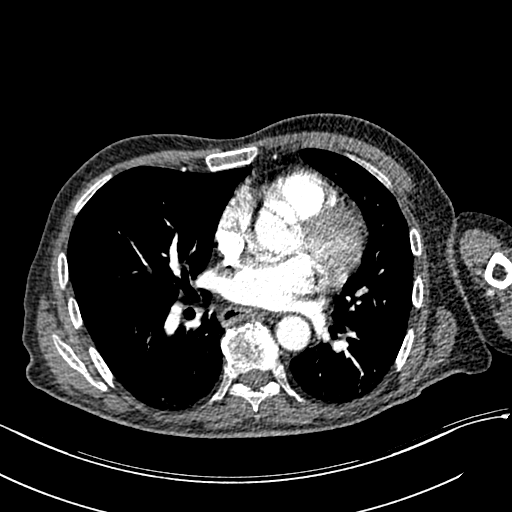
[im 101/258  lung]
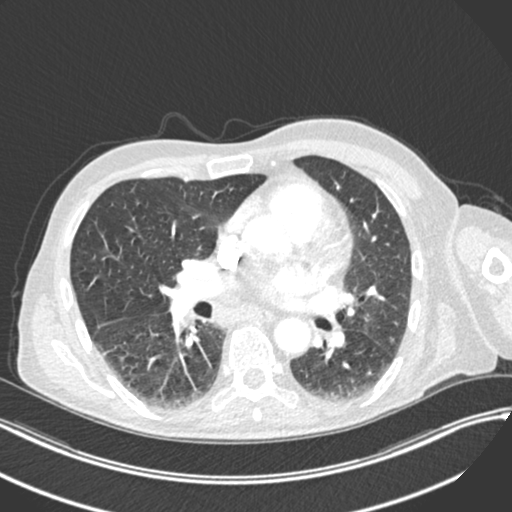
[im 123/258  soft-tissue]
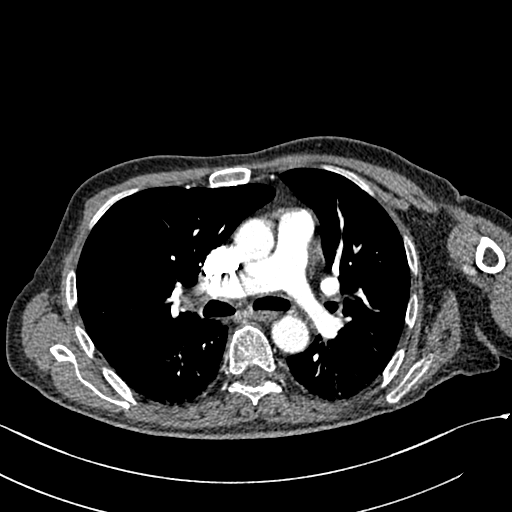
[im 135/258  lung]
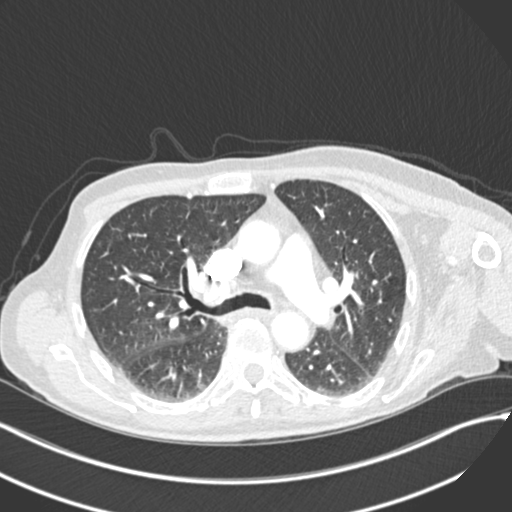
[im 157/258  soft-tissue]
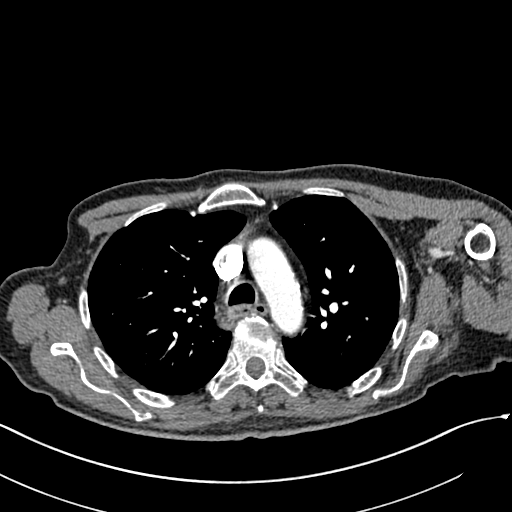
[im 168/258  lung]
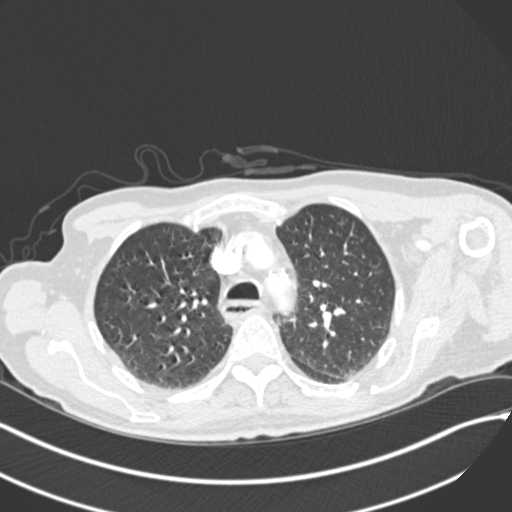
[im 179/258  soft-tissue]
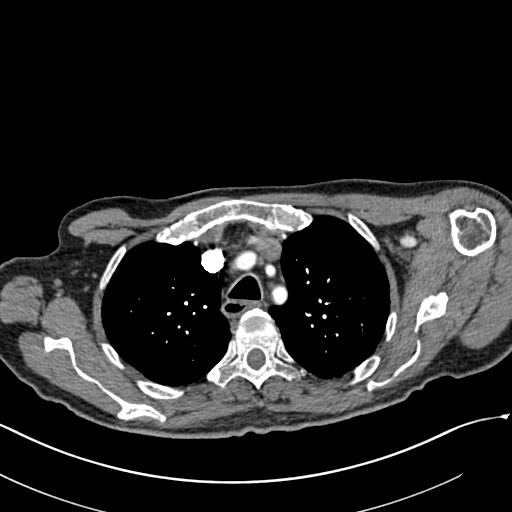
[im 202/258  lung]
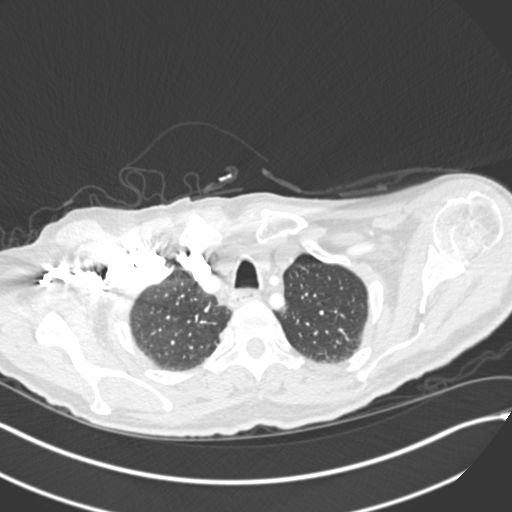
[im 213/258  soft-tissue]
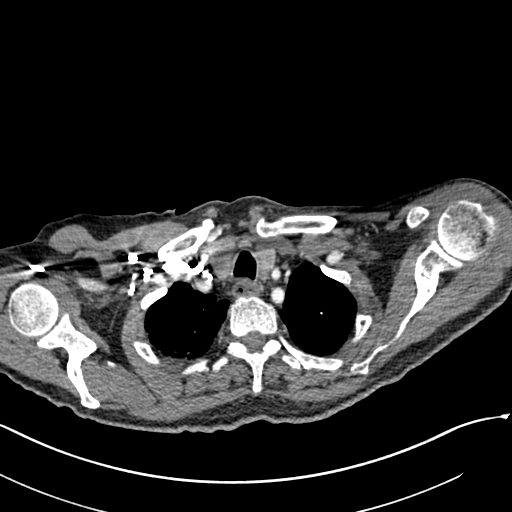
[im 224/258  lung]
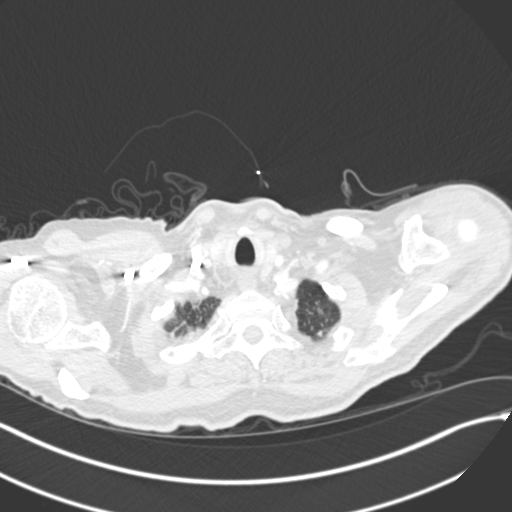
[im 246/258  soft-tissue]
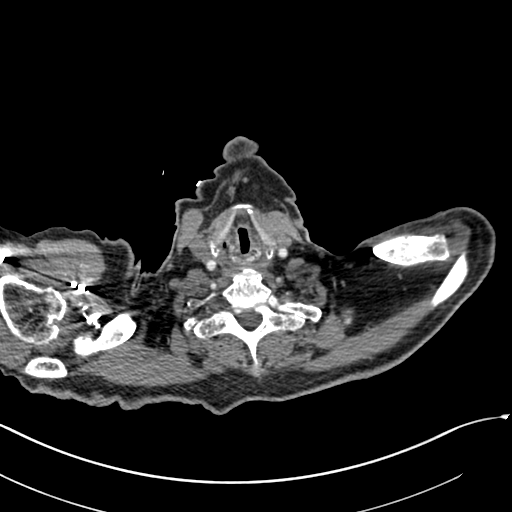

[Series 6: coronal mpr · coronal · 0.51mm/px · 2 of 80 slices shown]
[im 27/80  soft-tissue]
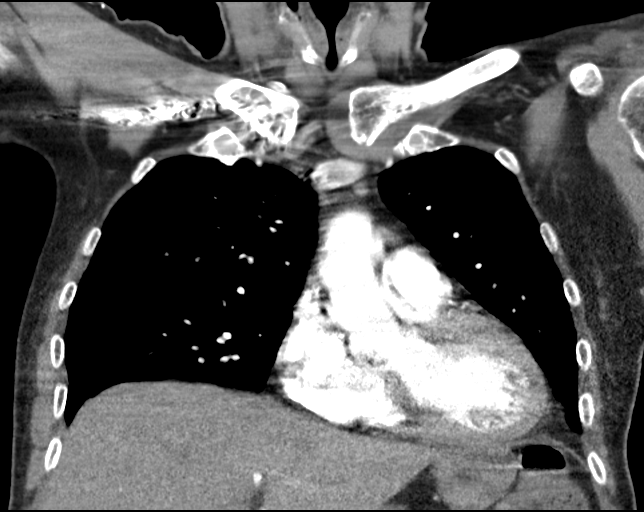
[im 53/80  soft-tissue]
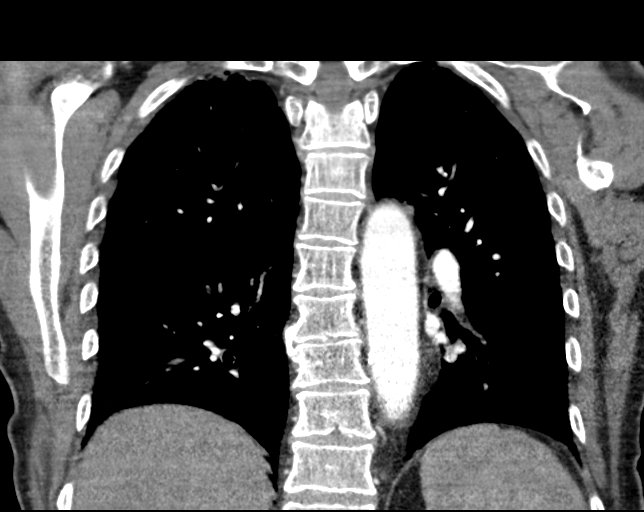

[18 of 46 positions shown; findings below may reference images not displayed]

FINDINGS: Cardiovascular: Thoracic aorta demonstrates atherosclerotic
calcifications without aneurysmal dilatation or dissection. Coronary
calcifications are seen. No significant cardiac enlargement is
noted. The pulmonary artery shows a normal branching pattern without
intraluminal filling defect to suggest pulmonary embolism.

Mediastinum/Nodes: Thoracic inlet demonstrates a hypodense possibly
fatty lesion within the right lobe of the thyroid measuring 18 mm in
greatest dimension. No significant hilar or mediastinal adenopathy
is identified. The esophagus as visualized is within normal limits.

Lungs/Pleura: 10 mm subpleural nodule is noted within the lingula
laterally on the left. Mild emphysematous changes are noted. No
focal confluent infiltrate is seen. No sizable effusion is noted.

Upper Abdomen: No acute abnormality noted.

Musculoskeletal: Degenerative changes of the thoracic spine are
seen. No acute bony abnormality is noted.

Review of the MIP images confirms the above findings.
IMPRESSION: No evidence of pulmonary emboli.

Emphysematous changes.

10 mm left lingular subpleural nodule. This may represent some
scarring from previous changes on prior CT examination.

Hypodense lesion within the right lobe of the thyroid. Follow-up
ultrasound can be performed on a nonemergent basis as clinically
indicated.

## 2016-09-05 IMAGING — DX DG CHEST 2V
2 series · 2 of 2 positions shown · non-contrast
Comparison: Chest x-ray of [DATE]

CLINICAL DATA: Fever and cough, dyspnea at rest. History of
diabetes, monoclonal gammopathy, former smoker.

EXAM:
CHEST  2 VIEW

[chest pa]
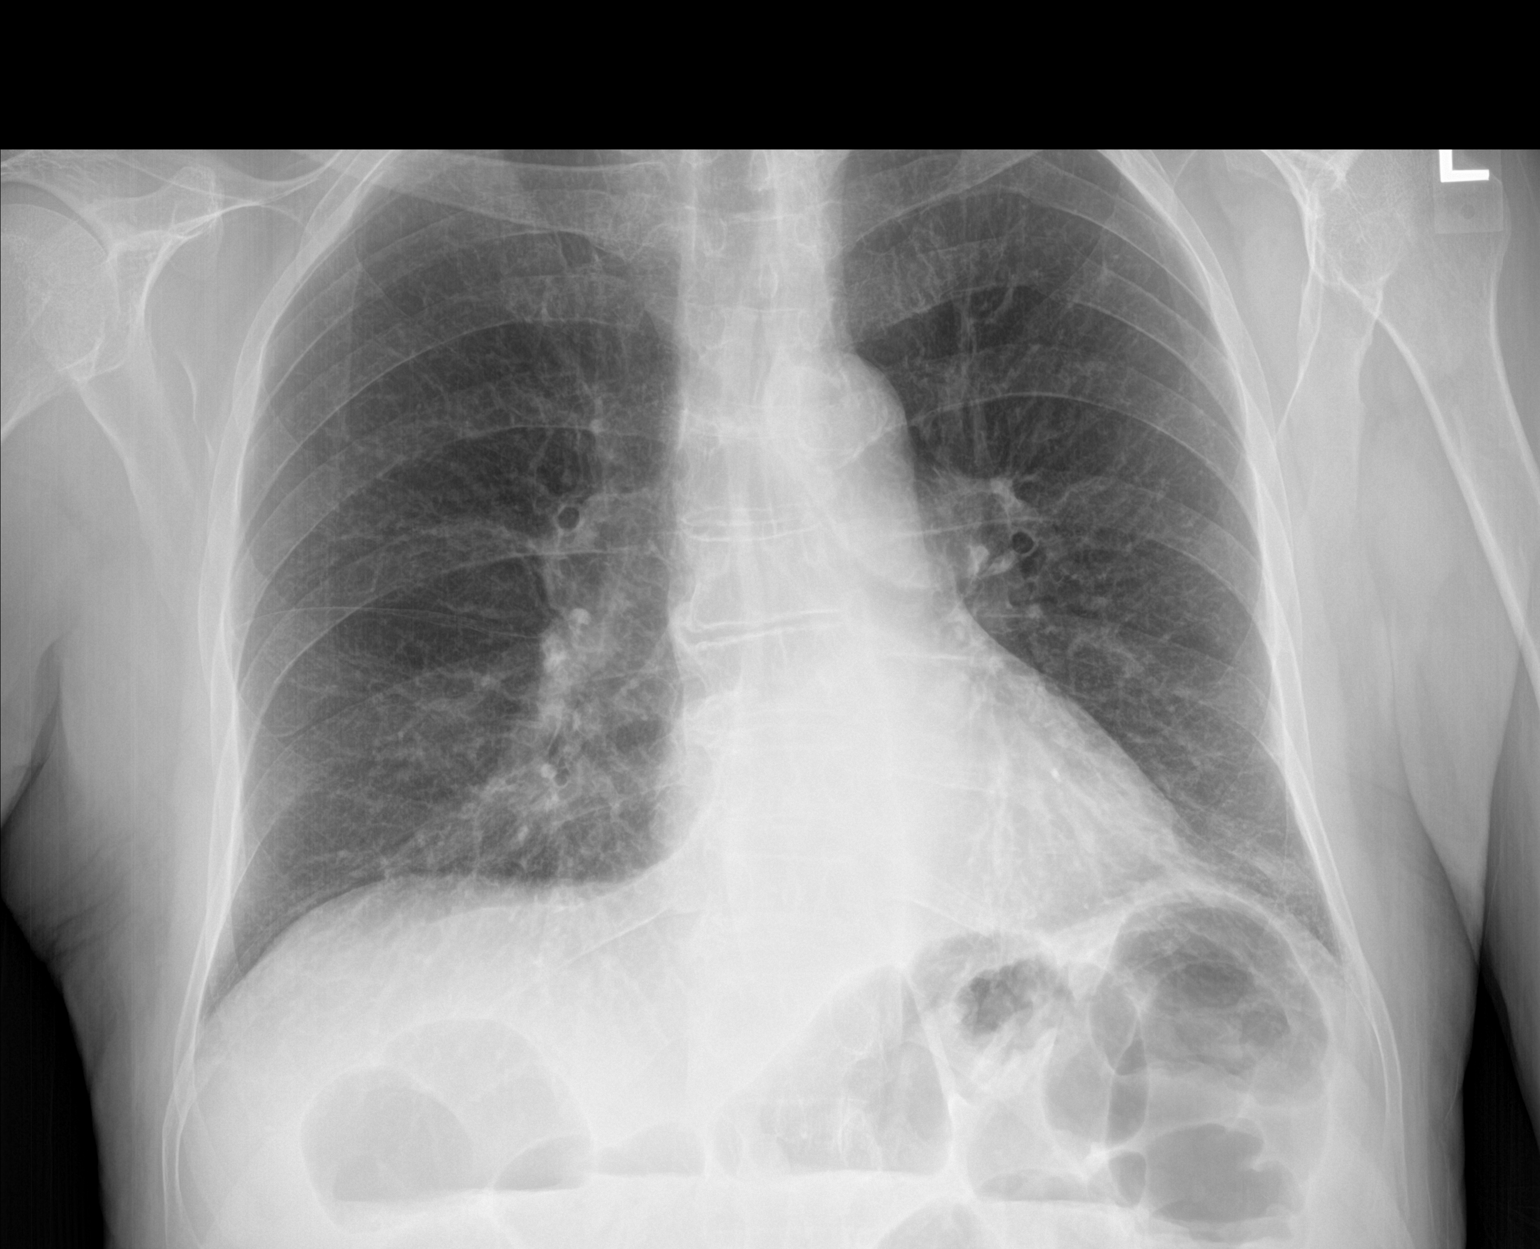

[chest lat]
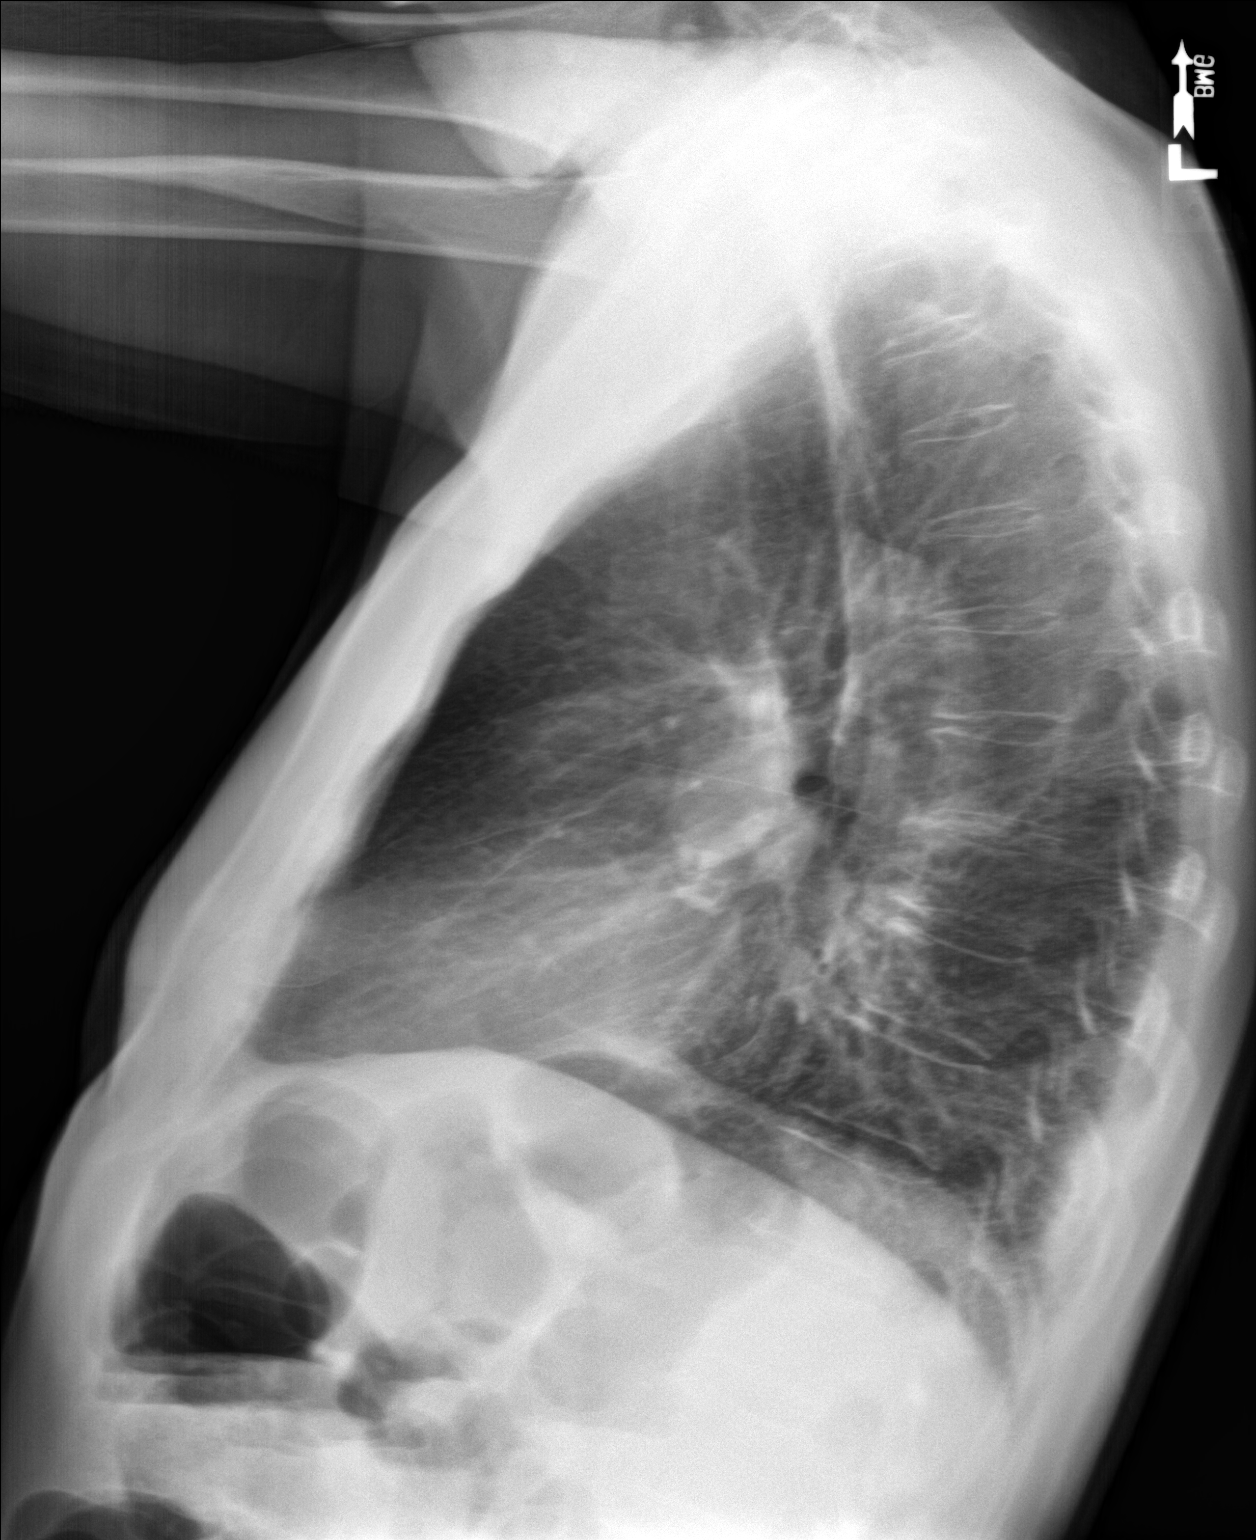

[2 of 2 positions shown; findings below may reference images not displayed]

FINDINGS: The lungs are adequately inflated. There is no focal infiltrate.
There is stable coarse lung markings lateral to the cardiac apex
most compatible with scarring. There is no significant pleural
effusion. There is no pneumothorax or pneumomediastinum. The heart
and pulmonary vascularity are normal. There is calcification of the
wall of the aortic arch.
IMPRESSION: No pneumonia, CHF, nor other acute cardiopulmonary abnormality. Mild
chronic bronchitic changes.

Thoracic aortic atherosclerosis.

## 2016-09-05 MED ORDER — LEVALBUTEROL HCL 0.63 MG/3ML IN NEBU
0.6300 mg | INHALATION_SOLUTION | Freq: Once | RESPIRATORY_TRACT | Status: DC
Start: 2016-09-05 — End: 2016-09-05

## 2016-09-05 MED ORDER — SODIUM CHLORIDE 0.9 % IJ SOLN
INTRAMUSCULAR | Status: AC
  Filled 2016-09-05: qty 50

## 2016-09-05 MED ORDER — IOPAMIDOL (ISOVUE-370) INJECTION 76%
INTRAVENOUS | Status: AC
  Filled 2016-09-05: qty 100

## 2016-09-05 MED ORDER — IOPAMIDOL (ISOVUE-370) INJECTION 76%
100.0000 mL | Freq: Once | INTRAVENOUS | Status: AC | PRN
  Administered 2016-09-05: 100 mL via INTRAVENOUS

## 2016-09-05 MED ORDER — IPRATROPIUM BROMIDE 0.02 % IN SOLN: 0.5000 mg | Freq: Once | RESPIRATORY_TRACT | Status: DC

## 2016-09-05 MED ORDER — OSELTAMIVIR PHOSPHATE 75 MG PO CAPS: 75.0000 mg | ORAL_CAPSULE | Freq: Two times a day (BID) | ORAL | 0 refills | Status: DC

## 2016-09-05 NOTE — Progress Notes (Signed)
Subjective:  By signing my name below, I, Essence Howell, attest that this documentation has been prepared under the direction and in the presence of Delman Cheadle, MD Electronically Signed: Ladene Artist, ED Scribe 09/05/2016 at 12:17 PM.   Patient ID: Tommy Peterson, male    DOB: Sep 26, 1940, 76 y.o.   MRN: [id number]  Chief Complaint  Patient presents with  . Cough    x 3 days  . Generalized Body Aches  . Fatigue  . Fever   HPI Tommy Peterson is a 76 y.o. male, with a h/o DM, who presents to Primary Care at [facility] complaining of a cough onset 3 days ago. Pt reports associated symptoms of fever, fatigue, generalized body aches, sob. Triage temperature 99.1 F. He has tried an OTC cough syrup without significant relief. Pt did not receive a flu vaccine this season. No h/o asthma. Pt reports a h/o tobacco use but quit ~20 years ago. He finished a 10 day coarse of amoxicillin last night for an UTI. Pt has checked his blood glucose with readings ranging from 85-170 over the past 3 days. He reports h/o anemia that has required multiple transfusions. Pt states that his last hgb was 9.4 last week. He denies a h/o GI bleeds. Pt has had a bone marrow biopsy done to rule out leukemia.   Past Medical History:  Diagnosis Date  . Anemia   . Cataract   . Diabetes mellitus without complication ([place])   . Macrocytosis   . Monoclonal gammopathy    Current Outpatient Prescriptions on File Prior to Visit  Medication Sig Dispense Refill  . aspirin 81 MG tablet Take 81 mg by mouth daily.    . budesonide (ENTOCORT EC) 3 MG 24 hr capsule Take 6 mg by mouth daily.    . diazepam (VALIUM) 5 MG tablet Take 5 mg by mouth at bedtime as needed for sleep or muscle spasms.    Marland Kitchen LANTUS SOLOSTAR 100 UNIT/ML Solostar Pen Inject 24 Units into the skin at bedtime.     . ondansetron (ZOFRAN) 4 MG tablet Take 4 mg by mouth 4 (four) times daily as needed for nausea/vomiting.    Marland Kitchen oxyCODONE-acetaminophen (PERCOCET/ROXICET)  5-325 MG tablet Take 1 tablet by mouth every 6 (six) hours as needed for pain.    Marland Kitchen triamcinolone cream (KENALOG) 0.1 % Apply 1 application topically 2 (two) times daily. (Patient taking differently: Apply 1 application topically 2 (two) times daily as needed (for basel cell spots on forehead). ) 30 g 0  . cephALEXin (KEFLEX) 500 MG capsule Take 1 capsule (500 mg total) by mouth 2 (two) times daily. (Patient not taking: Reported on 09/05/2016) 14 capsule 0  . potassium chloride SA (K-DUR,KLOR-CON) 20 MEQ tablet Take 2 tablets (40 mEq total) by mouth once. 2 tablet 0   No current facility-administered medications on file prior to visit.    Allergies  Allergen Reactions  . Bee Venom Swelling  . Metformin And Related Nausea And Vomiting   Review of Systems  Constitutional: Positive for fatigue and fever.  Respiratory: Positive for cough and shortness of breath.   Musculoskeletal: Positive for myalgias.      Objective:   Physical Exam  Constitutional: He is oriented to person, place, and time. He appears well-developed and well-nourished. No distress.  HENT:  Head: Normocephalic and atraumatic.  Right Ear: Tympanic membrane is scarred.  Left Ear: Tympanic membrane normal.  Nose: Nose normal.  R TM: Copious amount of scarring, almost gives appearence  of central perforation.   Eyes: Conjunctivae and EOM are normal.  Neck: Neck supple. No tracheal deviation present.  Cardiovascular: Normal rate, regular rhythm, S1 normal, S2 normal and normal heart sounds.   Pulmonary/Chest: Effort normal. No respiratory distress. He has rales in the left lower field.  Distant breaths.  Musculoskeletal: Normal range of motion.  Neurological: He is alert and oriented to person, place, and time.  Skin: Skin is warm and dry.  Psychiatric: He has a normal mood and affect. His behavior is normal.  Nursing note and vitals reviewed.  BP 130/68 (BP Location: Right Arm, Patient Position: Sitting, Cuff Size:  Large)   Pulse 93   Temp 99.1 F (37.3 C) (Oral)   Resp 20   Ht 5' 9.5" (1.765 m)   Wt 155 lb 9.6 oz (70.6 kg)   SpO2 98%   BMI 22.65 kg/m   Repeat Vitals: SpO2 100%  Pulse 108  Results for orders placed or performed in visit on 09/05/16  POCT Influenza A/B  Result Value Ref Range   Influenza A, POC Negative Negative   Influenza B, POC Negative Negative  POCT CBC  Result Value Ref Range   WBC 16.7 (A) 4.6 - 10.2 K/uL   Lymph, poc 7.1 (A) 0.6 - 3.4   POC LYMPH PERCENT 42.3 10 - 50 %L   MID (cbc) 1.5 (A) 0 - 0.9   POC MID % 8.9 0 - 12 %M   POC Granulocyte 8.1 (A) 2 - 6.9   Granulocyte percent 48.8 37 - 80 %G   RBC 2.42 (A) 4.69 - 6.13 M/uL   Hemoglobin 9.2 (A) 14.1 - 18.1 g/dL   HCT, POC 26.1 (A) 43.5 - 53.7 %   MCV 107.8 (A) 80 - 97 fL   MCH, POC 38.2 (A) 27 - 31.2 pg   MCHC 35.4 31.8 - 35.4 g/dL   RDW, POC 19.6 %   Platelet Count, POC 483 (A) 142 - 424 K/uL   MPV 7.3 0 - 99.8 fL  POCT glucose (manual entry)  Result Value Ref Range   POC Glucose 206 (A) 70 - 99 mg/dl   Dg Chest 2 View  Result Date: 09/05/2016 CLINICAL DATA:  Fever and cough, dyspnea at rest. History of diabetes, monoclonal gammopathy, former smoker. EXAM: CHEST  2 VIEW COMPARISON:  Chest x-ray of May 30, 2016 FINDINGS: The lungs are adequately inflated. There is no focal infiltrate. There is stable coarse lung markings lateral to the cardiac apex most compatible with scarring. There is no significant pleural effusion. There is no pneumothorax or pneumomediastinum. The heart and pulmonary vascularity are normal. There is calcification of the wall of the aortic arch. IMPRESSION: No pneumonia, CHF, nor other acute cardiopulmonary abnormality. Mild chronic bronchitic changes. Thoracic aortic atherosclerosis. Electronically Signed   By: David  Martinique M.D.   On: 09/05/2016 12:40   EKG: Sinus tachycardia with poor R wave progression. Persistence of chest waves and lateral chest leads. Loss of R waves and III  and aVF compared to 05/04/16.    Assessment & Plan:   1. Fever, unspecified fever cause   2. Shortness of breath   I suspect that pt has a viral illness like the flu- cover with tamiflu due to high risk with his unknown comorbidities. However, his increased work of breathing and notable dyspnea at rest is really quite alarming though his vitals have remind stable beyond some mild tachycardia. Pt could not tolerate neb treatment in office - attempted xopenex  and atrovent but he was so tachypneic that he just kept getting choked and spluttering. Due to some EKG changes, tachycardia, and dyspnea we proceeded with stat chest CTA as he may be at increased risk of coagulopathy due to blood dyscrasia. Recheck tomorrow w/ Carlis Abbott - appt sched. If worse o/n -> to ER. Would have low threshold to cover with zpack if acute pulm illness sxs worsen.  However, w/ recent c. Diff we do want to avoid abx use if poss so hold off for now.  Orders Placed This Encounter  Procedures  . DG Chest 2 View    Standing Status:   Future    Number of Occurrences:   1    Standing Expiration Date:   09/05/2017    Order Specific Question:   Reason for Exam (SYMPTOM  OR DIAGNOSIS REQUIRED)    Answer:   fever, cough, increased work of breathing at rest    Order Specific Question:   Preferred imaging location?    Answer:   External  . POCT Influenza A/B  . POCT CBC  . POCT SEDIMENTATION RATE  . POCT glucose (manual entry)  . EKG 12-Lead    Meds ordered this encounter  Medications  . DISCONTD: ipratropium (ATROVENT) nebulizer solution 0.5 mg  . DISCONTD: levalbuterol (XOPENEX) nebulizer solution 0.63 mg  . oseltamivir (TAMIFLU) 75 MG capsule    Sig: Take 1 capsule (75 mg total) by mouth 2 (two) times daily.    Dispense:  10 capsule    Refill:  0   Over 40 min spent in face-to-face evaluation of and consultation with patient and coordination of care.  Over 50% of this time was spent counseling this patient.  I personally  performed the services described in this documentation, which was scribed in my presence. The recorded information has been reviewed and considered, and addended by me as needed.   Delman Cheadle, M.D.  [facility] [address] [place], [place] [address] [phone] phone [phone] fax  09/06/16 12:09 PM

## 2016-09-05 NOTE — Progress Notes (Signed)
CT Angio received and given to Dr. Brigitte Pulse

## 2016-09-05 NOTE — Patient Instructions (Addendum)
   IF you received an x-ray today, you will receive an invoice from [facility]. Please contact [facility] at [phone] with questions or concerns regarding your invoice.   IF you received labwork today, you will receive an invoice from [facility]. Please contact [facility] at [phone] with questions or concerns regarding your invoice.   Our billing staff will not be able to assist you with questions regarding bills from these companies.  You will be contacted with the lab results as soon as they are available. The fastest way to get your results is to activate your My Chart account. Instructions are located on the last page of this paperwork. If you have not heard from us regarding the results in 2 weeks, please contact this office.      Influenza, Adult Influenza, more commonly known as "the flu," is a viral infection that primarily affects the respiratory tract. The respiratory tract includes organs that help you breathe, such as the lungs, nose, and throat. The flu causes many common cold symptoms, as well as a high fever and body aches. The flu spreads easily from person to person (is contagious). Getting a flu shot (influenza vaccination) every year is the best way to prevent influenza. What are the causes? Influenza is caused by a virus. You can catch the virus by:  Breathing in droplets from an infected person's cough or sneeze.  Touching something that was recently contaminated with the virus and then touching your mouth, nose, or eyes.  What increases the risk? The following factors may make you more likely to get the flu:  Not cleaning your hands frequently with soap and water or alcohol-based hand sanitizer.  Having close contact with many people during cold and flu season.  Touching your mouth, eyes, or nose without washing or sanitizing your hands first.  Not drinking enough fluids or not eating a healthy diet.  Not getting enough sleep or  exercise.  Being under a high amount of stress.  Not getting a yearly (annual) flu shot.  You may be at a higher risk of complications from the flu, such as a severe lung infection (pneumonia), if you:  Are over the age of 65.  Are pregnant.  Have a weakened disease-fighting system (immune system). You may have a weakened immune system if you: ? Have HIV or AIDS. ? Are undergoing chemotherapy. ? Aretaking medicines that reduce the activity of (suppress) the immune system.  Have a long-term (chronic) illness, such as heart disease, kidney disease, diabetes, or lung disease.  Have a liver disorder.  Are obese.  Have anemia.  What are the signs or symptoms? Symptoms of this condition typically last 4-10 days and may include:  Fever.  Chills.  Headache, body aches, or muscle aches.  Sore throat.  Cough.  Runny or congested nose.  Chest discomfort and cough.  Poor appetite.  Weakness or tiredness (fatigue).  Dizziness.  Nausea or vomiting.  How is this diagnosed? This condition may be diagnosed based on your medical history and a physical exam. Your health care provider may do a nose or throat swab test to confirm the diagnosis. How is this treated? If influenza is detected early, you can be treated with antiviral medicine that can reduce the length of your illness and the severity of your symptoms. This medicine may be given by mouth (orally) or through an IV tube that is inserted in one of your veins. The goal of treatment is to relieve symptoms by taking   care of yourself at home. This may include taking over-the-counter medicines, drinking plenty of fluids, and adding humidity to the air in your home. In some cases, influenza goes away on its own. Severe influenza or complications from influenza may be treated in a hospital. Follow these instructions at home:  Take over-the-counter and prescription medicines only as told by your health care provider.  Use a  cool mist humidifier to add humidity to the air in your home. This can make breathing easier.  Rest as needed.  Drink enough fluid to keep your urine clear or pale yellow.  Cover your mouth and nose when you cough or sneeze.  Wash your hands with soap and water often, especially after you cough or sneeze. If soap and water are not available, use hand sanitizer.  Stay home from work or school as told by your health care provider. Unless you are visiting your health care provider, try to avoid leaving home until your fever has been gone for 24 hours without the use of medicine.  Keep all follow-up visits as told by your health care provider. This is important. How is this prevented?  Getting an annual flu shot is the best way to avoid getting the flu. You may get the flu shot in late summer, fall, or winter. Ask your health care provider when you should get your flu shot.  Wash your hands often or use hand sanitizer often.  Avoid contact with people who are sick during cold and flu season.  Eat a healthy diet, drink plenty of fluids, get enough sleep, and exercise regularly. Contact a health care provider if:  You develop new symptoms.  You have: ? Chest pain. ? Diarrhea. ? A fever.  Your cough gets worse.  You produce more mucus.  You feel nauseous or you vomit. Get help right away if:  You develop shortness of breath or difficulty breathing.  Your skin or nails turn a bluish color.  You have severe pain or stiffness in your neck.  You develop a sudden headache or sudden pain in your face or ear.  You cannot stop vomiting. This information is not intended to replace advice given to you by your health care provider. Make sure you discuss any questions you have with your health care provider. Document Released: 07/21/2000 Document Revised: 12/30/2015 Document Reviewed: 05/18/2015 Elsevier Interactive Patient Education  2017 Elsevier Inc.  

## 2016-09-06 ENCOUNTER — Ambulatory Visit (INDEPENDENT_AMBULATORY_CARE_PROVIDER_SITE_OTHER): Payer: Medicare HMO | Admitting: Physician Assistant

## 2016-09-06 VITALS — BP 122/72 | HR 104 | Temp 98.9°F | Resp 17 | Ht 69.5 in | Wt 155.0 lb

## 2016-09-06 DIAGNOSIS — R69 Illness, unspecified: Secondary | ICD-10-CM

## 2016-09-06 DIAGNOSIS — J111 Influenza due to unidentified influenza virus with other respiratory manifestations: Secondary | ICD-10-CM

## 2016-09-06 NOTE — Progress Notes (Signed)
09/06/2016 2:22 PM   DOB: Sep 20, 1940 / MRN: [id number]  SUBJECTIVE:  Tommy Peterson is a 76 y.o. male presenting for recheck per Dr. Brigitte Pulse.  He tells me "I fell a whole lot better." He denies SOB of breath at this time. CT scan was negative for blood clot. He sees Dr. [name] for primary care.  He slept well last night and has not been coughing today. Denies chest pain.  Continues to complain of mild HA.     He is allergic to bee venom and metformin and related.   He  has a past medical history of Anemia; Cataract; Diabetes mellitus without complication ([place]); Macrocytosis; and Monoclonal gammopathy.    He  reports that he quit smoking about 20 years ago. His smoking use included Cigarettes. He has never used smokeless tobacco. He reports that he drinks alcohol. He reports that he does not use drugs. He  reports that he does not engage in sexual activity. The patient  has a past surgical history that includes Hernia repair; Prostate surgery; Cataract extraction; Tonsillectomy; Penile prosthesis implant; and Bone marrow biopsy (Right, 2016).  His family history includes Diabetes in his mother; Heart failure in his mother.  Review of Systems  Constitutional: Negative for chills and fever.  Respiratory: Negative for cough and shortness of breath.   Neurological: Positive for headaches. Negative for dizziness.  Psychiatric/Behavioral: Negative for depression.    The problem list and medications were reviewed and updated by myself where necessary and exist elsewhere in the encounter.   OBJECTIVE:  BP 122/72 (BP Location: Right Arm, Patient Position: Sitting, Cuff Size: Normal)   Pulse (!) 104   Temp 98.9 F (37.2 C) (Oral)   Resp 17   Ht 5' 9.5" (1.765 m)   Wt 155 lb (70.3 kg)   SpO2 98%   BMI 22.56 kg/m   Physical Exam  Constitutional: He is oriented to person, place, and time.  Cardiovascular: Normal rate, regular rhythm and normal heart sounds.   Rate 94 by auscultation   Pulmonary/Chest: Effort normal and breath sounds normal. He has no wheezes. He has no rales.  Musculoskeletal: He exhibits no edema.  Neurological: He is alert and oriented to person, place, and time.  Skin: Skin is warm and dry. He is not diaphoretic.    Results for orders placed or performed in visit on 09/05/16 (from the past 72 hour(s))  POCT Influenza A/B     Status: None   Collection Time: 09/05/16 12:44 PM  Result Value Ref Range   Influenza A, POC Negative Negative   Influenza B, POC Negative Negative  POCT CBC     Status: Abnormal   Collection Time: 09/05/16 12:46 PM  Result Value Ref Range   WBC 16.7 (A) 4.6 - 10.2 K/uL   Lymph, poc 7.1 (A) 0.6 - 3.4   POC LYMPH PERCENT 42.3 10 - 50 %L   MID (cbc) 1.5 (A) 0 - 0.9   POC MID % 8.9 0 - 12 %M   POC Granulocyte 8.1 (A) 2 - 6.9   Granulocyte percent 48.8 37 - 80 %G   RBC 2.42 (A) 4.69 - 6.13 M/uL   Hemoglobin 9.2 (A) 14.1 - 18.1 g/dL   HCT, POC 26.1 (A) 43.5 - 53.7 %   MCV 107.8 (A) 80 - 97 fL   MCH, POC 38.2 (A) 27 - 31.2 pg   MCHC 35.4 31.8 - 35.4 g/dL   RDW, POC 19.6 %   Platelet Count, POC 483 (  A) 142 - 424 K/uL   MPV 7.3 0 - 99.8 fL  POCT glucose (manual entry)     Status: Abnormal   Collection Time: 09/05/16 12:47 PM  Result Value Ref Range   POC Glucose 206 (A) 70 - 99 mg/dl  POCT SEDIMENTATION RATE     Status: Abnormal   Collection Time: 09/05/16  1:58 PM  Result Value Ref Range   POCT SED RATE 67 (A) 0 - 22 mm/hr    Ct Angio Chest Pe W Or Wo Contrast  Result Date: 09/05/2016 CLINICAL DATA:  Shortness of breath and hypoxia EXAM: CT ANGIOGRAPHY CHEST WITH CONTRAST TECHNIQUE: Multidetector CT imaging of the chest was performed using the standard protocol during bolus administration of intravenous contrast. Multiplanar CT image reconstructions and MIPs were obtained to evaluate the vascular anatomy. CONTRAST:  100 mL Isovue 370. COMPARISON:  06/01/2015. FINDINGS: Cardiovascular: Thoracic aorta demonstrates  atherosclerotic calcifications without aneurysmal dilatation or dissection. Coronary calcifications are seen. No significant cardiac enlargement is noted. The pulmonary artery shows a normal branching pattern without intraluminal filling defect to suggest pulmonary embolism. Mediastinum/Nodes: Thoracic inlet demonstrates a hypodense possibly fatty lesion within the right lobe of the thyroid measuring 18 mm in greatest dimension. No significant hilar or mediastinal adenopathy is identified. The esophagus as visualized is within normal limits. Lungs/Pleura: 10 mm subpleural nodule is noted within the lingula laterally on the left. Mild emphysematous changes are noted. No focal confluent infiltrate is seen. No sizable effusion is noted. Upper Abdomen: No acute abnormality noted. Musculoskeletal: Degenerative changes of the thoracic spine are seen. No acute bony abnormality is noted. Review of the MIP images confirms the above findings. IMPRESSION: No evidence of pulmonary emboli. Emphysematous changes. 10 mm left lingular subpleural nodule. This may represent some scarring from previous changes on prior CT examination. Hypodense lesion within the right lobe of the thyroid. Follow-up ultrasound can be performed on a nonemergent basis as clinically indicated. Electronically Signed   By: Inez Catalina M.D.   On: 09/05/2016 16:10     ASSESSMENT AND PLAN:  Sudeep was seen today for follow-up.  Diagnoses and all orders for this visit:  Influenza-like illness Comments: Resolving.  His presentation is drastically improved today. Will continue current plan of tamiflu. He will share his CTA with oncology and PCP.     The patient is advised to call or return to clinic if he does not see an improvement in symptoms, or to seek the care of the closest emergency department if he worsens with the above plan.   Philis Fendt, MHS, PA-C [facility] 09/06/2016 2:22 PM

## 2016-09-06 NOTE — Patient Instructions (Addendum)
  Ct Angio Chest Pe W Or Wo Contrast  Result Date: 09/05/2016 CLINICAL DATA:  Shortness of breath and hypoxia EXAM: CT ANGIOGRAPHY CHEST WITH CONTRAST TECHNIQUE: Multidetector CT imaging of the chest was performed using the standard protocol during bolus administration of intravenous contrast. Multiplanar CT image reconstructions and MIPs were obtained to evaluate the vascular anatomy. CONTRAST:  100 mL Isovue 370. COMPARISON:  06/01/2015. FINDINGS: Cardiovascular: Thoracic aorta demonstrates atherosclerotic calcifications without aneurysmal dilatation or dissection. Coronary calcifications are seen. No significant cardiac enlargement is noted. The pulmonary artery shows a normal branching pattern without intraluminal filling defect to suggest pulmonary embolism. Mediastinum/Nodes: Thoracic inlet demonstrates a hypodense possibly fatty lesion within the right lobe of the thyroid measuring 18 mm in greatest dimension. No significant hilar or mediastinal adenopathy is identified. The esophagus as visualized is within normal limits. Lungs/Pleura: 10 mm subpleural nodule is noted within the lingula laterally on the left. Mild emphysematous changes are noted. No focal confluent infiltrate is seen. No sizable effusion is noted. Upper Abdomen: No acute abnormality noted. Musculoskeletal: Degenerative changes of the thoracic spine are seen. No acute bony abnormality is noted. Review of the MIP images confirms the above findings. IMPRESSION: No evidence of pulmonary emboli. Emphysematous changes. 10 mm left lingular subpleural nodule. This may represent some scarring from previous changes on prior CT examination. Hypodense lesion within the right lobe of the thyroid. Follow-up ultrasound can be performed on a nonemergent basis as clinically indicated. Electronically Signed   By: Inez Catalina M.D.   On: 09/05/2016 16:10     IF you received an x-ray today, you will receive an invoice from [facility]. Please  contact [facility] at [phone] with questions or concerns regarding your invoice.   IF you received labwork today, you will receive an invoice from [facility]. Please contact [facility] at [phone] with questions or concerns regarding your invoice.   Our billing staff will not be able to assist you with questions regarding bills from these companies.  You will be contacted with the lab results as soon as they are available. The fastest way to get your results is to activate your My Chart account. Instructions are located on the last page of this paperwork. If you have not heard from [place] regarding the results in 2 weeks, please contact this office.

## 2016-09-07 DIAGNOSIS — M25512 Pain in left shoulder: Secondary | ICD-10-CM | POA: Diagnosis not present

## 2016-09-11 ENCOUNTER — Ambulatory Visit (INDEPENDENT_AMBULATORY_CARE_PROVIDER_SITE_OTHER): Payer: Medicare HMO | Admitting: Physician Assistant

## 2016-09-11 VITALS — BP 110/56 | HR 89 | Temp 98.4°F | Resp 17 | Ht 69.5 in | Wt 157.0 lb

## 2016-09-11 DIAGNOSIS — R300 Dysuria: Secondary | ICD-10-CM | POA: Diagnosis not present

## 2016-09-11 LAB — POCT URINALYSIS DIP (MANUAL ENTRY)
Bilirubin, UA: NEGATIVE
Glucose, UA: 500 — AB
Ketones, POC UA: NEGATIVE
Nitrite, UA: NEGATIVE
Spec Grav, UA: >=1.03
UROBILINOGEN UA: 0.2
pH, UA: 6

## 2016-09-11 LAB — POC MICROSCOPIC URINALYSIS (UMFC): Mucus: ABSENT

## 2016-09-11 NOTE — Progress Notes (Signed)
Patient ID: Tommy Peterson, male    DOB: [birth year], 76 y.o.   MRN: [id number]  PCP: Janett Billow, MD  Chief Complaint  Patient presents with  . Urinary Tract Infection    4 weeks ago/ went to [facility]  . Dysuria    Still burning    Subjective:   Presents for evaluation of dysuria.  This very nice gentleman, with diabetes, chronic colitis, chronic leucocytosis, chronic absolute anemia, and history of major depression, presented here on 08/21/2016 with 7 days of urinary frequency and 3 days of feeling sick.Marland Kitchen He reported chills, urinary frequency, urinary urgency and nausea. He was normotensive but tachycardic and afebrile. Glucose was 348, WBC 27.2, UA notable for trace leukocytes, 30 protein, small blood, trace ketones, 500 glucose. Out of concern for DKA/sepsis, he was sent to the ED. Additional evaluation in the ED revealed a normal anion gap and lactic acid level. His symptoms were thought likely due to UTI and he received a dose of ceftriaxone IM and was sent home of cephalexin. 5 days later, he was contacted and advised that the antibiotic needed to be changed to Amoxicillin.  In the interim, he's been seen by hematology at [facility] and has returned here for influenza-like illness, for which he was treated with Tamiflu.  Today he reports continued burning with urination, along with continued increased urinary frequency (18-20 voids in 24 hours), worse in the evenings.  Low grade subjective fever and chills. There is no change in the chronic diarrhea.  No SOB, CP. No nausea, vomiting or abdominal pain. No new muscle or joint pain.  He reports that his glucose this morning was 88. He checks glucose only once daily, fasting, in the mornings.  Culture of urine specimen collected here on 08/21/2016 revealed Enterococcus faecalis (>100,000 cfu/mL and E coli (25,000-50,000 cfu/mL), both sensitive to cipro, levoquin, nitrofurantoin. E coli also sensitive to multiple other agents, E  faecalis also sensitive to PCN and vancomycin, but resistant to tetracycline.  Culture of the urine collected in the ED on 08/21/2016 revealed E faecalis (>100,000 c/mL), sensitive to ampicillin, levofloxacin, nitrofurantoin and vancomycin.   Review of Systems As above.    Patient Active Problem List   Diagnosis Date Noted  . C. difficile colitis 05/31/2016  . Lactic acidosis   . History of bone marrow biopsy 05/27/2016  . Fever 05/27/2016  . Encephalopathy in sepsis 05/27/2016  . Sepsis ([place]) 05/27/2016  . Chest pain 05/04/2016  . Benign fibroma of prostate 09/20/2015  . HLD (hyperlipidemia) 09/20/2015  . BP (high blood pressure) 09/20/2015  . Arthritis, degenerative 09/20/2015  . Colitis, collagenous 09/09/2015  . Absolute anemia 09/08/2014  . Adenoma of large intestine 07/13/2014  . Disturbance of skin sensation 01/09/2014  . Diabetes ([place]) 06/24/2013  . Recurrent major depressive disorder, in full remission ([place]) 07/18/2012  . Allergic rhinitis 08/29/2011     Prior to Admission medications   Medication Sig Start Date End Date Taking? Authorizing Provider  aspirin 81 MG tablet Take 81 mg by mouth daily.    Historical Provider, MD  budesonide (ENTOCORT EC) 3 MG 24 hr capsule Take 6 mg by mouth daily.    Historical Provider, MD  diazepam (VALIUM) 5 MG tablet Take 5 mg by mouth at bedtime as needed for sleep or muscle spasms. 08/08/16   Historical Provider, MD  LANTUS SOLOSTAR 100 UNIT/ML Solostar Pen Inject 24 Units into the skin at bedtime.  01/05/14   Historical Provider, MD  triamcinolone cream (KENALOG) 0.1 % Apply 1 application topically 2 (two) times daily. Patient taking differently: Apply 1 application topically 2 (two) times daily as needed (for basel cell spots on forehead).  02/14/16   Jaynee Eagles, PA-C     Allergies  Allergen Reactions  . Bee Venom Swelling  . Metformin And Related Nausea And Vomiting  . Sulfamethoxazole-Trimethoprim Nausea And Vomiting         Objective:  Physical Exam  Constitutional: He is oriented to person, place, and time. He appears well-developed and well-nourished. He is active and cooperative. No distress.  BP (!) 110/56 (BP Location: Right Arm, Patient Position: Sitting, Cuff Size: Normal)   Pulse 89   Temp 98.4 F (36.9 C) (Oral)   Resp 17   Ht 5' 9.5" (1.765 m)   Wt 157 lb (71.2 kg)   SpO2 99%   BMI 22.85 kg/m   HENT:  Head: Normocephalic and atraumatic.  Right Ear: Hearing normal.  Left Ear: Hearing normal.  Eyes: Conjunctivae are normal. No scleral icterus.  Neck: Normal range of motion. Neck supple. No thyromegaly present.  Cardiovascular: Normal rate, regular rhythm and normal heart sounds.   Pulses:      Radial pulses are 2+ on the right side, and 2+ on the left side.  Pulmonary/Chest: Effort normal and breath sounds normal.  Lymphadenopathy:       Head (right side): No tonsillar, no preauricular, no posterior auricular and no occipital adenopathy present.       Head (left side): No tonsillar, no preauricular, no posterior auricular and no occipital adenopathy present.    He has no cervical adenopathy.       Right: No supraclavicular adenopathy present.       Left: No supraclavicular adenopathy present.  Neurological: He is alert and oriented to person, place, and time. No sensory deficit.  Skin: Skin is warm, dry and intact. No rash noted. No cyanosis or erythema. Nails show no clubbing.  Psychiatric: He has a normal mood and affect. His speech is normal and behavior is normal.       Results for orders placed or performed in visit on 09/11/16  POCT Microscopic Urinalysis ([facility])  Result Value Ref Range   WBC,UR,HPF,POC Too numerous to count  (A) None WBC/hpf   RBC,UR,HPF,POC Too numerous to count  (A) None RBC/hpf   Bacteria Moderate (A) None, Too numerous to count   Mucus Absent Absent   Epithelial Cells, UR Per Microscopy None None, Too numerous to count cells/hpf  POCT urinalysis dipstick   Result Value Ref Range   Color, UA yellow yellow   Clarity, UA cloudy (A) clear   Glucose, UA =500 (A) negative   Bilirubin, UA negative negative   Ketones, POC UA negative negative   Spec Grav, UA >=1.030    Blood, UA large (A) negative   pH, UA 6.0    Protein Ur, POC =100 (A) negative   Urobilinogen, UA 0.2    Nitrite, UA Negative Negative   Leukocytes, UA moderate (2+) (A) Negative       Assessment & Plan:   1. Dysuria Given 2 recent antibiotics (cephalexin and amoxicillin), and chronic colitis, elect to await UCx before committing to another round. In the meantime, he will contact his urologist to schedule a visit there. If he develops worsening symptoms in the interim, he's to contact me or his PCP. I would plan to send in levofloxacin or nitrofurantoin, with Florastor, based on the previous culture. -  POCT Microscopic Urinalysis ([facility]) - POCT urinalysis dipstick - Urine culture   Fara Chute, PA-C Physician Assistant-Certified [facility] [place]

## 2016-09-11 NOTE — Patient Instructions (Addendum)
Call your urologist to schedule a visit there. If your symptoms get worse between now and that appointment, let me or your GP know. I will contact you as soon as I get the culture results.    IF you received an x-ray today, you will receive an invoice from [facility]. Please contact [facility] at [phone] with questions or concerns regarding your invoice.   IF you received labwork today, you will receive an invoice from [facility]. Please contact [facility] at [phone] with questions or concerns regarding your invoice.   Our billing staff will not be able to assist you with questions regarding bills from these companies.  You will be contacted with the lab results as soon as they are available. The fastest way to get your results is to activate your My Chart account. Instructions are located on the last page of this paperwork. If you have not heard from [place] regarding the results in 2 weeks, please contact this office.

## 2016-09-11 NOTE — Progress Notes (Signed)
Patient ID: Tommy Peterson, male    DOB: 03/31/1941, 76 y.o.   MRN: [id number]  PCP: Janett Billow, MD  Chief Complaint  Patient presents with  . Urinary Tract Infection    4 weeks ago/ went to [facility]  . Dysuria    Still burning    Subjective:   Presents for evaluation of dysuria.  Pt is a 76 yo caucasian male with a history of diabetes, colitis, and anemia who presents with persisting dysuria. Pt was seen at [facility] ED, diagnosed with a UTI, started on Rocephin, and sent home on Keflex on 08/21/16. Antibiotic was switched to Amoxicillin on 08/26/16 after urine culture resulted.   Pt was seen by Hematology at [facility] for evaluation of leukocytosis and anemia.  He was also seen here on 09/06/16 and treated for influenza with Tamiflu.  Pt states that he finished the 10 days of Amoxicillin. Today, pt complains of continuing dysuria, urinary urgency, frequency, and dark yellow urine. He states that he urinates 18-20x /24 hr period and that his symptoms are worse in the evenings. He admits to subjective low-grade fevers in the evenings. He denies chills, fatigue, weight loss, rashes, nausea, vomiting, abdominal pain, hematuria, headache, dizziness, back pain, myalgias, or arthralgias. Pt admits to chronic diarrhea x 6-7 yrs for which he takes Entocort. He has seen a urologist for benign prostate fibroma and penile implant in the past.  Review of Systems See HPI    Patient Active Problem List   Diagnosis Date Noted  . C. difficile colitis 05/31/2016  . Lactic acidosis   . History of bone marrow biopsy 05/27/2016  . Fever 05/27/2016  . Encephalopathy in sepsis 05/27/2016  . Sepsis ([place]) 05/27/2016  . Chest pain 05/04/2016  . Benign fibroma of prostate 09/20/2015  . HLD (hyperlipidemia) 09/20/2015  . BP (high blood pressure) 09/20/2015  . Arthritis, degenerative 09/20/2015  . Colitis, collagenous 09/09/2015  . Absolute anemia 09/08/2014  . Adenoma of large intestine  07/13/2014  . Disturbance of skin sensation 01/09/2014  . Diabetes ([place]) 06/24/2013  . Recurrent major depressive disorder, in full remission ([place]) 07/18/2012  . Allergic rhinitis 08/29/2011     Prior to Admission medications   Medication Sig Start Date End Date Taking? Authorizing Provider  aspirin 81 MG tablet Take 81 mg by mouth daily.    Historical Provider, MD  budesonide (ENTOCORT EC) 3 MG 24 hr capsule Take 6 mg by mouth daily.    Historical Provider, MD  diazepam (VALIUM) 5 MG tablet Take 5 mg by mouth at bedtime as needed for sleep or muscle spasms. 08/08/16   Historical Provider, MD  LANTUS SOLOSTAR 100 UNIT/ML Solostar Pen Inject 24 Units into the skin at bedtime.  01/05/14   Historical Provider, MD  triamcinolone cream (KENALOG) 0.1 % Apply 1 application topically 2 (two) times daily. Patient taking differently: Apply 1 application topically 2 (two) times daily as needed (for basel cell spots on forehead).  02/14/16   Jaynee Eagles, PA-C     Allergies  Allergen Reactions  . Bee Venom Swelling  . Metformin And Related Nausea And Vomiting       Objective:  Physical Exam HEENT: PERRLA. No lymphadenopathy or tenderness to palpation of neck.  Pulm: Good respiratory effort. CTAB. No wheezes, rales, or rhonchi. CV: RRR. No M/R/G. Abd: Soft, non-tender, non-distended. + BS x 4 quadrants.     Assessment & Plan:   1. Dysuria Pt advised to make an appointment  with his urologist. Pt stated that he would like to speak to his urologist before trying another therapy. Urine culture sent out. Pt advised to contact provider or PCP if fever arises or urinary symptoms worsen. Will consider using either Levaquin or Macrobid at that time.  - POCT Microscopic Urinalysis ([facility]) - POCT urinalysis dipstick - Urine culture  Lorella Nimrod, PA-S

## 2016-09-12 DIAGNOSIS — R300 Dysuria: Secondary | ICD-10-CM | POA: Diagnosis not present

## 2016-09-12 DIAGNOSIS — N390 Urinary tract infection, site not specified: Secondary | ICD-10-CM | POA: Diagnosis not present

## 2016-09-17 LAB — URINE CULTURE

## 2016-09-18 ENCOUNTER — Telehealth: Payer: Self-pay

## 2016-09-18 NOTE — Telephone Encounter (Signed)
-----   Message from [place], [place] sent at 09/18/2016  6:00 PM EST ----- Please call this patient. There is bacteria in his urine, but NOT an infection. How is he? When does/did he see the urologist?

## 2016-09-21 ENCOUNTER — Telehealth: Payer: Self-pay | Admitting: Emergency Medicine

## 2016-09-21 DIAGNOSIS — M25512 Pain in left shoulder: Secondary | ICD-10-CM | POA: Diagnosis not present

## 2016-09-21 NOTE — Telephone Encounter (Signed)
-----   Message from [place], [place] sent at 09/18/2016  6:00 PM EST ----- Please call this patient. There is bacteria in his urine, but NOT an infection. How is he? When does/did he see the urologist?

## 2016-09-25 DIAGNOSIS — N390 Urinary tract infection, site not specified: Secondary | ICD-10-CM | POA: Diagnosis not present

## 2016-09-25 DIAGNOSIS — E785 Hyperlipidemia, unspecified: Secondary | ICD-10-CM | POA: Diagnosis not present

## 2016-09-25 DIAGNOSIS — G5621 Lesion of ulnar nerve, right upper limb: Secondary | ICD-10-CM | POA: Diagnosis not present

## 2016-09-25 DIAGNOSIS — D539 Nutritional anemia, unspecified: Secondary | ICD-10-CM | POA: Diagnosis not present

## 2016-09-25 DIAGNOSIS — R768 Other specified abnormal immunological findings in serum: Secondary | ICD-10-CM | POA: Diagnosis not present

## 2016-09-28 DIAGNOSIS — D472 Monoclonal gammopathy: Secondary | ICD-10-CM | POA: Diagnosis not present

## 2016-09-28 DIAGNOSIS — D539 Nutritional anemia, unspecified: Secondary | ICD-10-CM | POA: Diagnosis not present

## 2016-09-28 DIAGNOSIS — D72828 Other elevated white blood cell count: Secondary | ICD-10-CM | POA: Diagnosis not present

## 2016-09-28 DIAGNOSIS — D72820 Lymphocytosis (symptomatic): Secondary | ICD-10-CM | POA: Diagnosis not present

## 2016-09-28 DIAGNOSIS — D649 Anemia, unspecified: Secondary | ICD-10-CM | POA: Diagnosis not present

## 2016-09-28 DIAGNOSIS — D7589 Other specified diseases of blood and blood-forming organs: Secondary | ICD-10-CM | POA: Diagnosis not present

## 2016-10-06 DIAGNOSIS — R911 Solitary pulmonary nodule: Secondary | ICD-10-CM | POA: Diagnosis not present

## 2016-10-06 DIAGNOSIS — C9510 Chronic leukemia of unspecified cell type not having achieved remission: Secondary | ICD-10-CM | POA: Diagnosis not present

## 2016-10-06 DIAGNOSIS — E119 Type 2 diabetes mellitus without complications: Secondary | ICD-10-CM | POA: Diagnosis not present

## 2016-10-06 DIAGNOSIS — I10 Essential (primary) hypertension: Secondary | ICD-10-CM | POA: Diagnosis not present

## 2016-10-06 DIAGNOSIS — N390 Urinary tract infection, site not specified: Secondary | ICD-10-CM | POA: Diagnosis not present

## 2016-10-06 DIAGNOSIS — Z79899 Other long term (current) drug therapy: Secondary | ICD-10-CM | POA: Diagnosis not present

## 2016-10-06 DIAGNOSIS — D6489 Other specified anemias: Secondary | ICD-10-CM | POA: Diagnosis not present

## 2016-10-06 DIAGNOSIS — E041 Nontoxic single thyroid nodule: Secondary | ICD-10-CM | POA: Diagnosis not present

## 2016-10-06 DIAGNOSIS — K52831 Collagenous colitis: Secondary | ICD-10-CM | POA: Diagnosis not present

## 2016-10-06 DIAGNOSIS — E7800 Pure hypercholesterolemia, unspecified: Secondary | ICD-10-CM | POA: Diagnosis not present

## 2016-10-11 DIAGNOSIS — G629 Polyneuropathy, unspecified: Secondary | ICD-10-CM | POA: Diagnosis not present

## 2016-10-11 DIAGNOSIS — G608 Other hereditary and idiopathic neuropathies: Secondary | ICD-10-CM | POA: Diagnosis not present

## 2016-10-11 DIAGNOSIS — G5603 Carpal tunnel syndrome, bilateral upper limbs: Secondary | ICD-10-CM | POA: Diagnosis not present

## 2016-10-11 DIAGNOSIS — G5621 Lesion of ulnar nerve, right upper limb: Secondary | ICD-10-CM | POA: Diagnosis not present

## 2016-10-16 DIAGNOSIS — N390 Urinary tract infection, site not specified: Secondary | ICD-10-CM | POA: Diagnosis not present

## 2016-10-16 DIAGNOSIS — E041 Nontoxic single thyroid nodule: Secondary | ICD-10-CM | POA: Diagnosis not present

## 2016-10-16 DIAGNOSIS — D649 Anemia, unspecified: Secondary | ICD-10-CM | POA: Diagnosis not present

## 2016-11-10 DIAGNOSIS — Z87891 Personal history of nicotine dependence: Secondary | ICD-10-CM | POA: Diagnosis not present

## 2016-11-10 DIAGNOSIS — Z79899 Other long term (current) drug therapy: Secondary | ICD-10-CM | POA: Diagnosis not present

## 2016-11-10 DIAGNOSIS — E119 Type 2 diabetes mellitus without complications: Secondary | ICD-10-CM | POA: Diagnosis not present

## 2016-11-10 DIAGNOSIS — Z6828 Body mass index (BMI) 28.0-28.9, adult: Secondary | ICD-10-CM | POA: Diagnosis not present

## 2016-11-10 DIAGNOSIS — K580 Irritable bowel syndrome with diarrhea: Secondary | ICD-10-CM | POA: Diagnosis not present

## 2016-11-10 DIAGNOSIS — Z0000 Encounter for general adult medical examination without abnormal findings: Secondary | ICD-10-CM | POA: Diagnosis not present

## 2016-11-10 DIAGNOSIS — Z794 Long term (current) use of insulin: Secondary | ICD-10-CM | POA: Diagnosis not present

## 2016-12-05 DIAGNOSIS — R69 Illness, unspecified: Secondary | ICD-10-CM | POA: Diagnosis not present

## 2016-12-13 DIAGNOSIS — R69 Illness, unspecified: Secondary | ICD-10-CM | POA: Diagnosis not present

## 2016-12-26 ENCOUNTER — Observation Stay (HOSPITAL_COMMUNITY)
Admission: EM | Admit: 2016-12-26 | Discharge: 2016-12-27 | Disposition: A | Discharge status: Home / Self Care | Payer: Medicare HMO | Attending: Internal Medicine | Admitting: Internal Medicine

## 2016-12-26 ENCOUNTER — Encounter (HOSPITAL_COMMUNITY): Payer: Self-pay | Admitting: Nurse Practitioner

## 2016-12-26 ENCOUNTER — Ambulatory Visit (INDEPENDENT_AMBULATORY_CARE_PROVIDER_SITE_OTHER): Payer: Medicare HMO

## 2016-12-26 ENCOUNTER — Ambulatory Visit (INDEPENDENT_AMBULATORY_CARE_PROVIDER_SITE_OTHER): Payer: Medicare HMO | Admitting: Family Medicine

## 2016-12-26 ENCOUNTER — Encounter: Payer: Self-pay | Admitting: Family Medicine

## 2016-12-26 VITALS — BP 121/62 | HR 108 | Temp 98.2°F | Resp 18 | Ht 69.49 in | Wt 153.6 lb

## 2016-12-26 DIAGNOSIS — R002 Palpitations: Secondary | ICD-10-CM

## 2016-12-26 DIAGNOSIS — E1165 Type 2 diabetes mellitus with hyperglycemia: Secondary | ICD-10-CM | POA: Diagnosis not present

## 2016-12-26 DIAGNOSIS — D539 Nutritional anemia, unspecified: Secondary | ICD-10-CM

## 2016-12-26 DIAGNOSIS — R197 Diarrhea, unspecified: Secondary | ICD-10-CM | POA: Diagnosis not present

## 2016-12-26 DIAGNOSIS — F419 Anxiety disorder, unspecified: Secondary | ICD-10-CM

## 2016-12-26 DIAGNOSIS — E118 Type 2 diabetes mellitus with unspecified complications: Secondary | ICD-10-CM

## 2016-12-26 DIAGNOSIS — R05 Cough: Secondary | ICD-10-CM | POA: Diagnosis not present

## 2016-12-26 DIAGNOSIS — C91Z0 Other lymphoid leukemia not having achieved remission: Secondary | ICD-10-CM | POA: Diagnosis not present

## 2016-12-26 DIAGNOSIS — D649 Anemia, unspecified: Secondary | ICD-10-CM

## 2016-12-26 DIAGNOSIS — C9590 Leukemia, unspecified not having achieved remission: Secondary | ICD-10-CM | POA: Insufficient documentation

## 2016-12-26 DIAGNOSIS — Z794 Long term (current) use of insulin: Secondary | ICD-10-CM

## 2016-12-26 DIAGNOSIS — R531 Weakness: Secondary | ICD-10-CM

## 2016-12-26 DIAGNOSIS — Z8719 Personal history of other diseases of the digestive system: Secondary | ICD-10-CM

## 2016-12-26 DIAGNOSIS — Z87891 Personal history of nicotine dependence: Secondary | ICD-10-CM | POA: Diagnosis not present

## 2016-12-26 DIAGNOSIS — Z79899 Other long term (current) drug therapy: Secondary | ICD-10-CM | POA: Diagnosis not present

## 2016-12-26 DIAGNOSIS — R739 Hyperglycemia, unspecified: Secondary | ICD-10-CM

## 2016-12-26 DIAGNOSIS — R0609 Other forms of dyspnea: Secondary | ICD-10-CM | POA: Diagnosis not present

## 2016-12-26 DIAGNOSIS — E119 Type 2 diabetes mellitus without complications: Secondary | ICD-10-CM | POA: Insufficient documentation

## 2016-12-26 DIAGNOSIS — Z7982 Long term (current) use of aspirin: Secondary | ICD-10-CM | POA: Diagnosis not present

## 2016-12-26 DIAGNOSIS — R69 Illness, unspecified: Secondary | ICD-10-CM | POA: Diagnosis not present

## 2016-12-26 DIAGNOSIS — Z5181 Encounter for therapeutic drug level monitoring: Secondary | ICD-10-CM | POA: Diagnosis not present

## 2016-12-26 DIAGNOSIS — R9431 Abnormal electrocardiogram [ECG] [EKG]: Secondary | ICD-10-CM | POA: Diagnosis not present

## 2016-12-26 DIAGNOSIS — R404 Transient alteration of awareness: Secondary | ICD-10-CM | POA: Diagnosis not present

## 2016-12-26 LAB — CBC WITH DIFFERENTIAL/PLATELET
Basophils Absolute: 0 10*3/uL (ref 0.0–0.1)
Basophils Relative: 0 %
EOS ABS: 0.1 10*3/uL (ref 0.0–0.7)
Eosinophils Relative: 1 %
HCT: 14.9 % — ABNORMAL LOW (ref 39.0–52.0)
Hemoglobin: 5.2 g/dL — CL (ref 13.0–17.0)
LYMPHS PCT: 39 %
Lymphs Abs: 5.2 10*3/uL — ABNORMAL HIGH (ref 0.7–4.0)
MCH: 41.9 pg — AB (ref 26.0–34.0)
MCHC: 34.9 g/dL (ref 30.0–36.0)
MCV: 120.2 fL — ABNORMAL HIGH (ref 78.0–100.0)
Monocytes Absolute: 1.1 10*3/uL — ABNORMAL HIGH (ref 0.1–1.0)
Monocytes Relative: 8 %
NEUTROS ABS: 7 10*3/uL (ref 1.7–7.7)
NEUTROS PCT: 52 %
PLATELETS: 461 10*3/uL — AB (ref 150–400)
RBC: 1.24 MIL/uL — ABNORMAL LOW (ref 4.22–5.81)
RDW: 16 % — ABNORMAL HIGH (ref 11.5–15.5)
WBC: 13.4 10*3/uL — AB (ref 4.0–10.5)

## 2016-12-26 LAB — CBC
HCT: 20.4 % — ABNORMAL LOW (ref 39.0–52.0)
Hemoglobin: 6.8 g/dL — CL (ref 13.0–17.0)
MCH: 35.1 pg — ABNORMAL HIGH (ref 26.0–34.0)
MCHC: 33.3 g/dL (ref 30.0–36.0)
MCV: 105.2 fL — ABNORMAL HIGH (ref 78.0–100.0)
PLATELETS: 423 10*3/uL — AB (ref 150–400)
RBC: 1.94 MIL/uL — ABNORMAL LOW (ref 4.22–5.81)
WBC: 11.4 10*3/uL — ABNORMAL HIGH (ref 4.0–10.5)

## 2016-12-26 LAB — COMPREHENSIVE METABOLIC PANEL
A/G RATIO: 1.4 (ref 1.2–2.2)
ALBUMIN: 3.5 g/dL (ref 3.5–4.8)
ALK PHOS: 74 U/L (ref 38–126)
ALT: 8 IU/L (ref 0–44)
ALT: 11 U/L — ABNORMAL LOW (ref 17–63)
ANION GAP: 7 (ref 5–15)
AST: 7 IU/L (ref 0–40)
AST: 12 U/L — ABNORMAL LOW (ref 15–41)
Albumin: 3.2 g/dL — ABNORMAL LOW (ref 3.5–5.0)
Alkaline Phosphatase: 85 IU/L (ref 39–117)
BILIRUBIN TOTAL: 1.1 mg/dL (ref 0.3–1.2)
BUN / CREAT RATIO: 27 — AB (ref 10–24)
BUN: 15 mg/dL (ref 8–27)
BUN: 13 mg/dL (ref 6–20)
Bilirubin Total: 0.7 mg/dL (ref 0.0–1.2)
CALCIUM: 8.8 mg/dL (ref 8.6–10.2)
CALCIUM: 8.3 mg/dL — AB (ref 8.9–10.3)
CO2: 19 mmol/L (ref 18–29)
CO2: 21 mmol/L — ABNORMAL LOW (ref 22–32)
CREATININE: 0.56 mg/dL — AB (ref 0.76–1.27)
Chloride: 102 mmol/L (ref 96–106)
Chloride: 106 mmol/L (ref 101–111)
Creatinine, Ser: 0.56 mg/dL — ABNORMAL LOW (ref 0.61–1.24)
GFR calc Af Amer: 116 mL/min/{1.73_m2} (ref >59)
GFR, EST NON AFRICAN AMERICAN: 100 mL/min/{1.73_m2} (ref >59)
GLOBULIN, TOTAL: 2.5 g/dL (ref 1.5–4.5)
GLUCOSE: 239 mg/dL — AB (ref 65–99)
Glucose: 311 mg/dL — ABNORMAL HIGH (ref 65–99)
POTASSIUM: 4.4 mmol/L (ref 3.5–5.2)
Potassium: 3.5 mmol/L (ref 3.5–5.1)
SODIUM: 136 mmol/L (ref 134–144)
Sodium: 134 mmol/L — ABNORMAL LOW (ref 135–145)
TOTAL PROTEIN: 5.9 g/dL — AB (ref 6.5–8.1)
Total Protein: 6 g/dL (ref 6.0–8.5)

## 2016-12-26 LAB — GLUCOSE, CAPILLARY
Glucose-Capillary: 277 mg/dL — ABNORMAL HIGH (ref 65–99)
Glucose-Capillary: 281 mg/dL — ABNORMAL HIGH (ref 65–99)

## 2016-12-26 LAB — POCT CBC
Granulocyte percent: 57.6 %G (ref 37–80)
HEMATOCRIT: 15 % — AB (ref 43.5–53.7)
Hemoglobin: 5.3 g/dL — AB (ref 14.1–18.1)
Lymph, poc: 5.8 — AB (ref 0.6–3.4)
MCH, POC: 42.2 pg — AB (ref 27–31.2)
MCHC: 35.4 g/dL (ref 31.8–35.4)
MCV: 119.1 fL — AB (ref 80–97)
MID (CBC): 0.8 (ref 0–0.9)
MPV: 7.4 fL (ref 0–99.8)
POC GRANULOCYTE: 8.9 — AB (ref 2–6.9)
POC LYMPH PERCENT: 37.5 %L (ref 10–50)
POC MID %: 4.9 % (ref 0–12)
Platelet Count, POC: 529 10*3/uL — AB (ref 142–424)
RBC: 1.26 M/uL — AB (ref 4.69–6.13)
RDW, POC: 17.6 %
WBC: 15.5 10*3/uL — AB (ref 4.6–10.2)

## 2016-12-26 LAB — PROTIME-INR
INR: 1.22
PROTHROMBIN TIME: 15.5 s — AB (ref 11.4–15.2)

## 2016-12-26 LAB — I-STAT TROPONIN, ED: TROPONIN I, POC: 0 ng/mL (ref 0.00–0.08)

## 2016-12-26 LAB — PREPARE RBC (CROSSMATCH)

## 2016-12-26 LAB — TROPONIN I

## 2016-12-26 LAB — OCCULT BLOOD X 1 CARD TO LAB, STOOL: Fecal Occult Bld: NEGATIVE

## 2016-12-26 LAB — GLUCOSE, POCT (MANUAL RESULT ENTRY): POC Glucose: 350 mg/dl — AB (ref 70–99)

## 2016-12-26 IMAGING — DX DG CHEST 2V
2 series · 2 of 2 positions shown · non-contrast
Comparison: Chest CTA [DATE] and earlier.

CLINICAL DATA: 75-year-old male with cough and abnormal left lower
lobe pulmonary auscultation.

EXAM:
CHEST  2 VIEW

[chest pa]
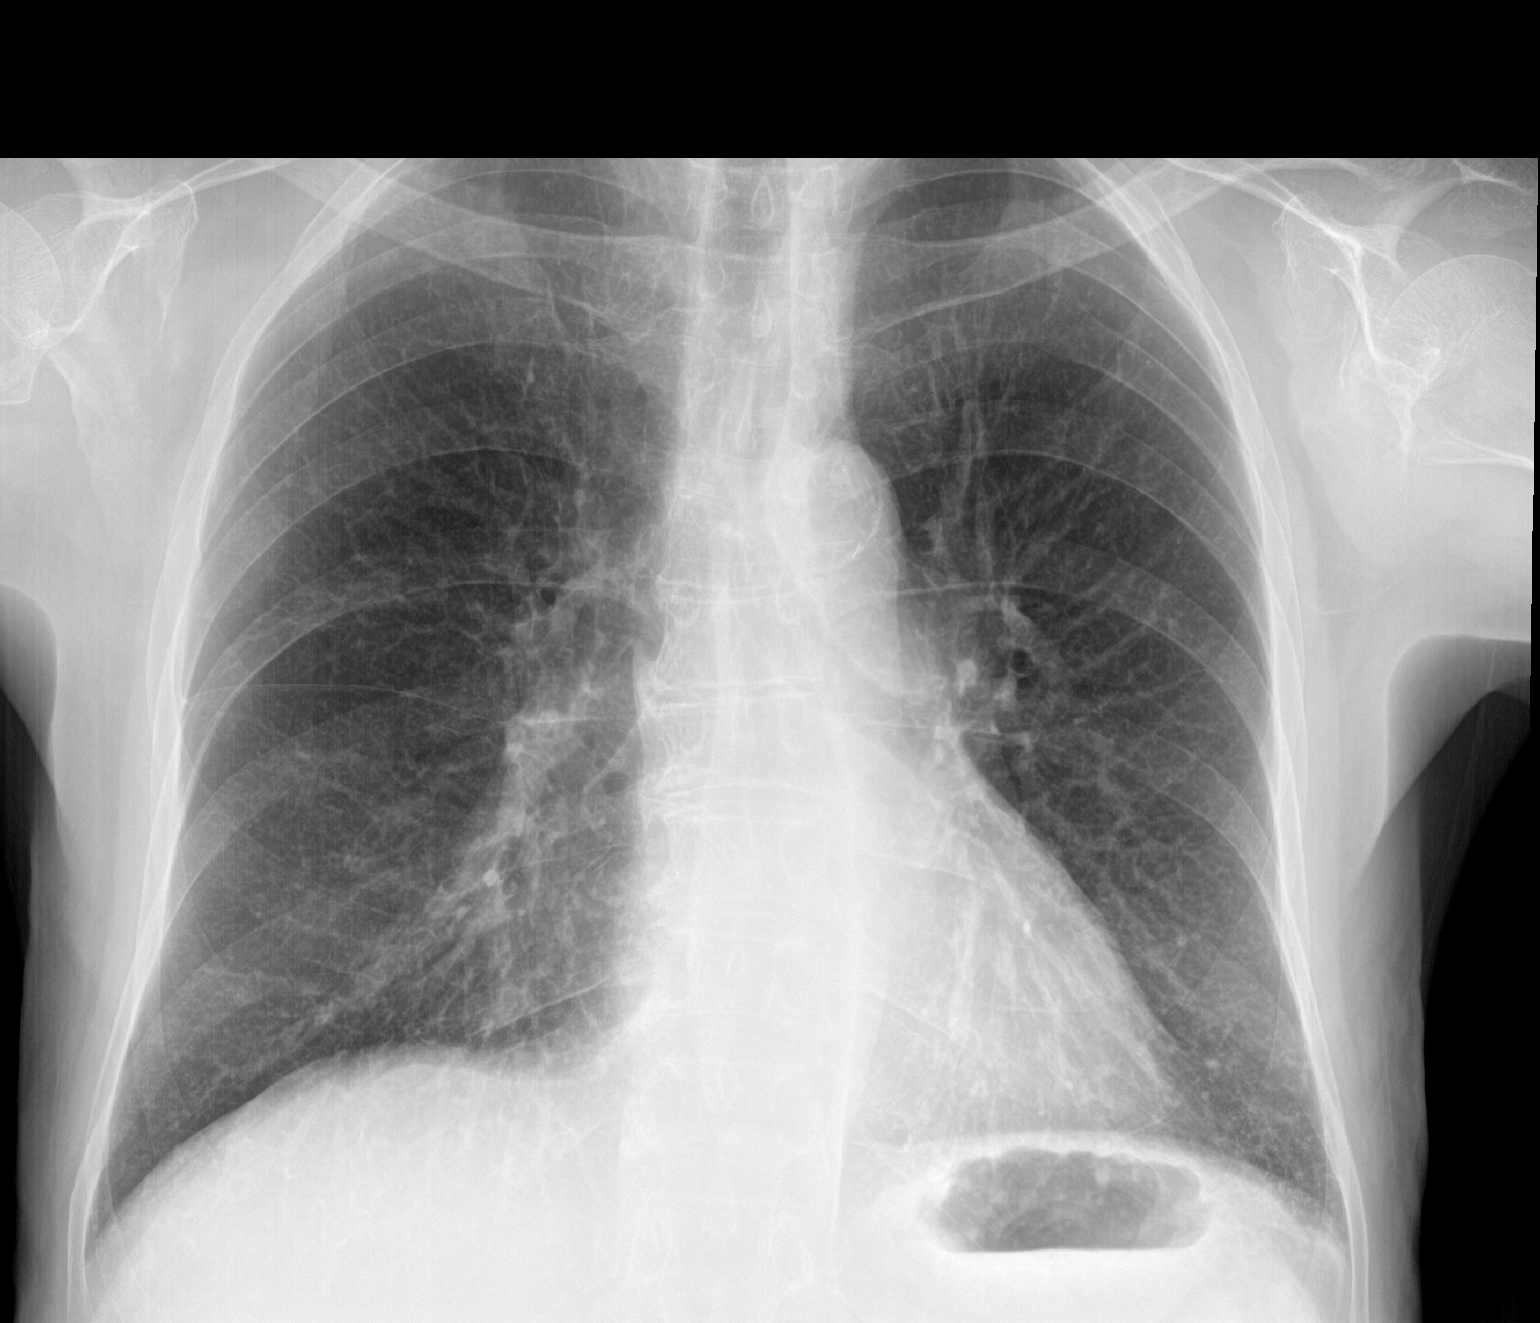

[chest lat]
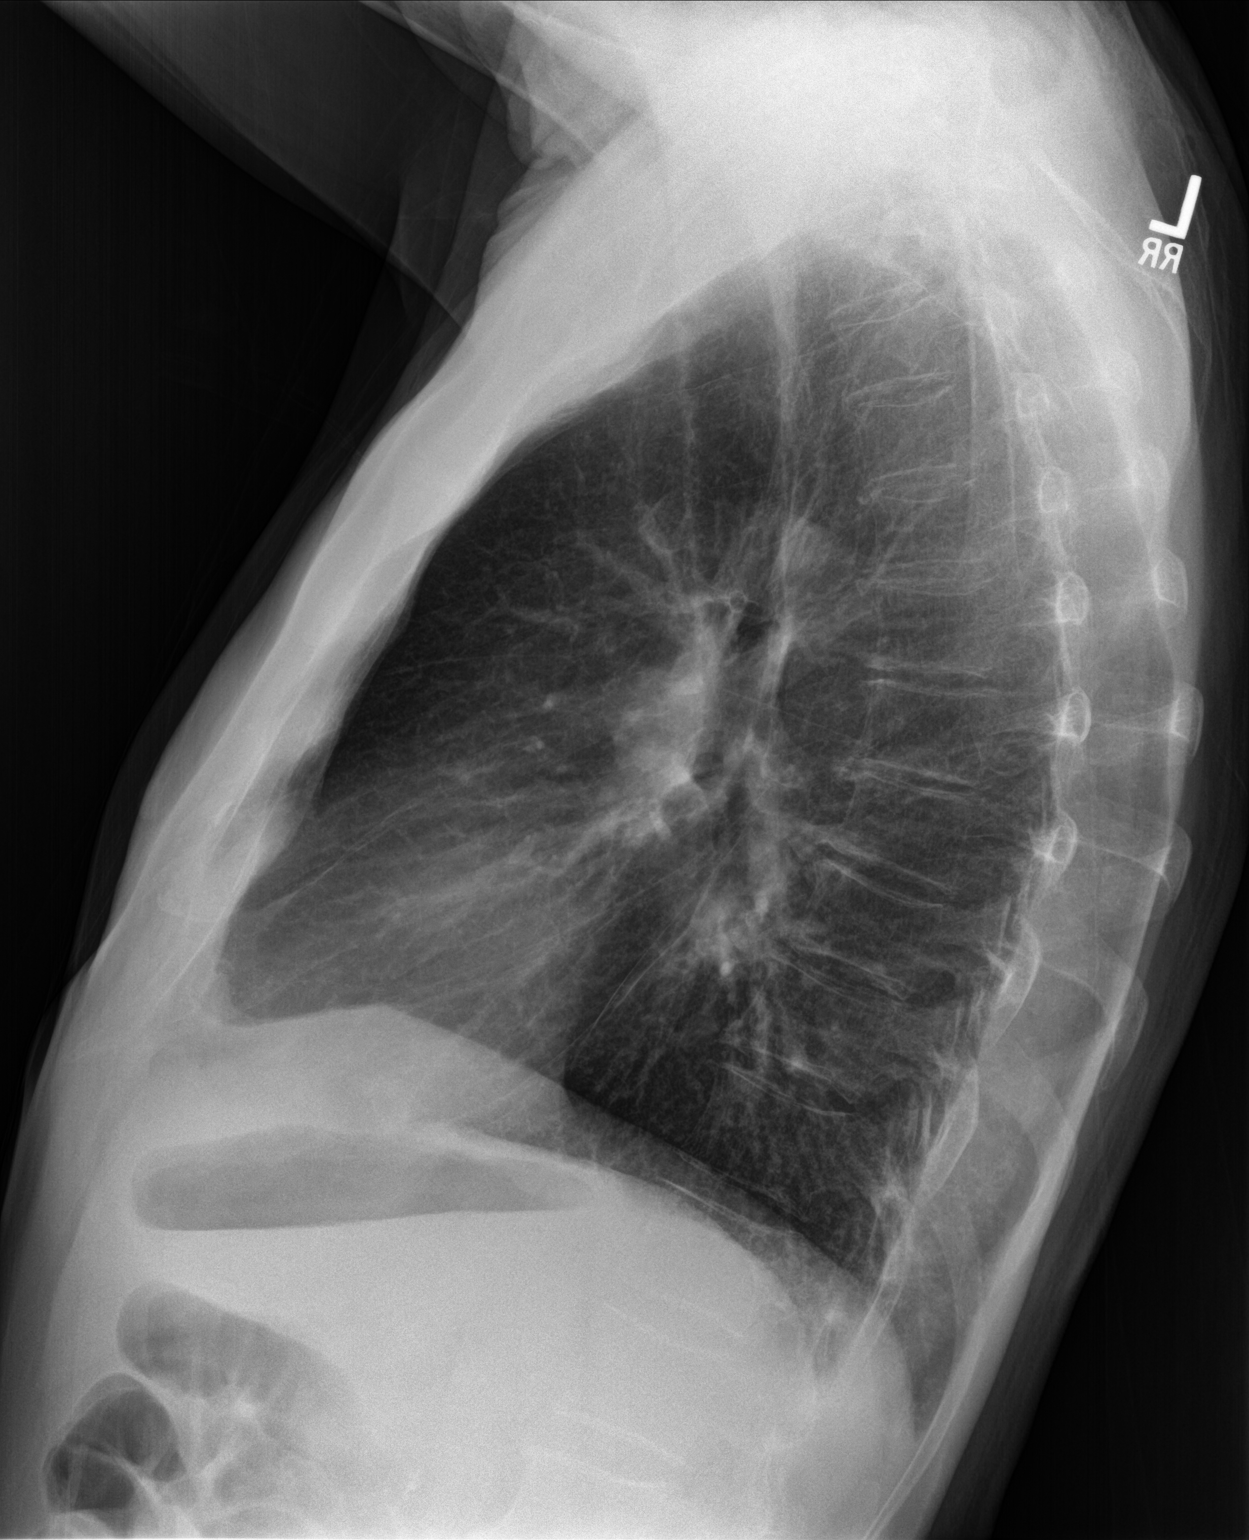

[2 of 2 positions shown; findings below may reference images not displayed]

FINDINGS: Stable lung volumes. Mediastinal contours remain normal. Calcified
aortic atherosclerosis. Visualized tracheal air column is within
normal limits. Chronic increased pulmonary interstitial markings
with no pneumothorax, pulmonary edema, pleural effusion or confluent
pulmonary opacity. Osteopenia. No acute osseous abnormality
identified. Negative visible bowel gas pattern.
IMPRESSION: No acute cardiopulmonary abnormality.

## 2016-12-26 MED ORDER — ACETAMINOPHEN 325 MG PO TABS: 650.0000 mg | ORAL_TABLET | Freq: Four times a day (QID) | ORAL | Status: DC | PRN

## 2016-12-26 MED ORDER — INSULIN ASPART 100 UNIT/ML ~~LOC~~ SOLN
0.0000 [IU] | Freq: Three times a day (TID) | SUBCUTANEOUS | Status: DC
  Administered 2016-12-27: 5 [IU] via SUBCUTANEOUS

## 2016-12-26 MED ORDER — SODIUM CHLORIDE 0.9% FLUSH
3.0000 mL | Freq: Two times a day (BID) | INTRAVENOUS | Status: DC
  Administered 2016-12-26 – 2016-12-27 (×2): 3 mL via INTRAVENOUS

## 2016-12-26 MED ORDER — INSULIN GLARGINE 100 UNIT/ML ~~LOC~~ SOLN
20.0000 [IU] | Freq: Every day | SUBCUTANEOUS | Status: DC
  Administered 2016-12-26: 20 [IU] via SUBCUTANEOUS
  Filled 2016-12-26 (×2): qty 0.2

## 2016-12-26 MED ORDER — BUDESONIDE 3 MG PO CPEP
9.0000 mg | ORAL_CAPSULE | Freq: Every day | ORAL | Status: DC
  Administered 2016-12-26 – 2016-12-27 (×2): 9 mg via ORAL
  Filled 2016-12-26 (×2): qty 3

## 2016-12-26 MED ORDER — SODIUM CHLORIDE 0.9 % IV SOLN: 250.0000 mL | INTRAVENOUS | Status: DC | PRN

## 2016-12-26 MED ORDER — SODIUM CHLORIDE 0.9% FLUSH
3.0000 mL | INTRAVENOUS | Status: DC | PRN
  Administered 2016-12-26: 3 mL via INTRAVENOUS
  Filled 2016-12-26: qty 3

## 2016-12-26 MED ORDER — SODIUM CHLORIDE 0.9 % IV SOLN
10.0000 mL/h | Freq: Once | INTRAVENOUS | Status: AC
  Administered 2016-12-26: 10 mL/h via INTRAVENOUS

## 2016-12-26 MED ORDER — ONDANSETRON HCL 4 MG PO TABS: 4.0000 mg | ORAL_TABLET | Freq: Four times a day (QID) | ORAL | Status: DC | PRN

## 2016-12-26 MED ORDER — ONDANSETRON HCL 4 MG/2ML IJ SOLN: 4.0000 mg | Freq: Four times a day (QID) | INTRAMUSCULAR | Status: DC | PRN

## 2016-12-26 MED ORDER — ACETAMINOPHEN 650 MG RE SUPP: 650.0000 mg | Freq: Four times a day (QID) | RECTAL | Status: DC | PRN

## 2016-12-26 MED ORDER — DIAZEPAM 5 MG PO TABS: 5.0000 mg | ORAL_TABLET | Freq: Every evening | ORAL | Status: DC | PRN

## 2016-12-26 MED ORDER — ASPIRIN EC 81 MG PO TBEC
81.0000 mg | DELAYED_RELEASE_TABLET | Freq: Every day | ORAL | Status: DC
  Administered 2016-12-26 – 2016-12-27 (×2): 81 mg via ORAL
  Filled 2016-12-26 (×2): qty 1

## 2016-12-26 NOTE — ED Triage Notes (Signed)
Per EMS pt from [place] to be evaluated for anemia. Patient went to PCP for exertional shortness of breath and generalized weakness. Last week hgb 8 today hgb 5. Patient pale. Denies chest pain, shob presently. Pt has hx of blood transdusions. Denies hemataemesis endorses chronic tarrry diarrhea- states has been checked and was negative for blood- sts colonoscopy completed and clipped some polyps.    Pt has LGL leukemia

## 2016-12-26 NOTE — ED Provider Notes (Signed)
[facility] DEPT Provider Note   CSN: [id number] Arrival date & time: 12/26/16  1240     History   Chief Complaint Chief Complaint  Patient presents with  . Weakness    HPI Tommy Peterson is a 76 y.o. male.  HPI Patient presents to the emergency room for evaluation of anemia. The patient has a history of recurrent anemia. He started having progressive dyspnea on exertion and fatigue over the last week or 2. He has not noticed any blood in stool but has had some dark stools. Patient states this is not unusual for him and he does not think he is having any rectal bleeding. Patient denies any trouble with chest pain. He went to see his primary care doctor today who did laboratory tests.  Results showed a hemoglobin of 5.3. He was sent to the emergency room for further evaluation including blood transfusion.  Past Medical History:  Diagnosis Date  . Anemia   . Cataract   . Diabetes mellitus without complication ([place])   . Macrocytosis   . Monoclonal gammopathy     Patient Active Problem List   Diagnosis Date Noted  . C. difficile colitis 05/31/2016  . Lactic acidosis   . History of bone marrow biopsy 05/27/2016  . Fever 05/27/2016  . Encephalopathy in sepsis 05/27/2016  . Sepsis ([place]) 05/27/2016  . Chest pain 05/04/2016  . Benign fibroma of prostate 09/20/2015  . HLD (hyperlipidemia) 09/20/2015  . BP (high blood pressure) 09/20/2015  . Arthritis, degenerative 09/20/2015  . Colitis, collagenous 09/09/2015  . Absolute anemia 09/08/2014  . Adenoma of large intestine 07/13/2014  . Disturbance of skin sensation 01/09/2014  . Diabetes ([place]) 06/24/2013  . Recurrent major depressive disorder, in full remission ([place]) 07/18/2012  . Allergic rhinitis 08/29/2011    Past Surgical History:  Procedure Laterality Date  . BONE MARROW BIOPSY Right 2016  . CATARACT EXTRACTION     bilateral  . HERNIA REPAIR     right inguinal  . PENILE PROSTHESIS IMPLANT    . PROSTATE SURGERY       BPH  . TONSILLECTOMY         Home Medications    Prior to Admission medications   Medication Sig Start Date End Date Taking? Authorizing Provider  aspirin 81 MG tablet Take 81 mg by mouth daily.    [provider]  budesonide (ENTOCORT EC) 3 MG 24 hr capsule Take 6 mg by mouth daily.    [provider]  diazepam (VALIUM) 5 MG tablet Take 5 mg by mouth at bedtime as needed for sleep or muscle spasms. 08/08/16   [provider]  LANTUS SOLOSTAR 100 UNIT/ML Solostar Pen Inject 24 Units into the skin at bedtime.  01/05/14   [provider]  triamcinolone cream (KENALOG) 0.1 % Apply 1 application topically 2 (two) times daily. Patient taking differently: Apply 1 application topically 2 (two) times daily as needed (for basel cell spots on forehead).  02/14/16   Jaynee Eagles, PA-C    Family History Family History  Problem Relation Age of Onset  . Diabetes Mother   . Heart failure Mother     Social History Social History  Substance Use Topics  . Smoking status: Former Smoker    Types: Cigarettes    Quit date: 05/04/1996  . Smokeless tobacco: Never Used     Comment: Smoked for 20 years  . Alcohol use Yes     Comment: 1-2 glasses of wine every other day  Allergies   Bee venom; Metformin and related; and Sulfamethoxazole-trimethoprim   Review of Systems Review of Systems  All other systems reviewed and are negative.    Physical Exam Updated Vital Signs BP 122/63 (BP Location: Right Arm)   Pulse 80   Temp 98.5 F (36.9 C) (Oral)   Resp 18   Ht 1.778 m (_0 )   Wt 69.4 kg (153 lb)   SpO2 100%   BMI 21.95 kg/m   Physical Exam  Constitutional: No distress.  HENT:  Head: Normocephalic and atraumatic.  Right Ear: External ear normal.  Left Ear: External ear normal.  Eyes: Conjunctivae are normal. Right eye exhibits no discharge. Left eye exhibits no discharge. No scleral icterus.  Neck: Neck supple. No tracheal deviation  present.  Cardiovascular: Normal rate, regular rhythm and intact distal pulses.   Pulmonary/Chest: Effort normal and breath sounds normal. No stridor. No respiratory distress. He has no wheezes. He has no rales.  Abdominal: Soft. Bowel sounds are normal. He exhibits no distension. There is no tenderness. There is no rebound and no guarding.  Genitourinary:  Genitourinary Comments: No gross blood on rectal exam  Musculoskeletal: He exhibits no edema or tenderness.  Neurological: He is alert. He has normal strength. No cranial nerve deficit (no facial droop, extraocular movements intact, no slurred speech) or sensory deficit. He exhibits normal muscle tone. He displays no seizure activity. Coordination normal.  Skin: Skin is warm and dry. No rash noted. He is not diaphoretic. There is pallor.  Psychiatric: He has a normal mood and affect.  Nursing note and vitals reviewed.    ED Treatments / Results  Labs (all labs ordered are listed, but only abnormal results are displayed) Labs Reviewed  COMPREHENSIVE METABOLIC PANEL - Abnormal; Notable for the following:       Result Value   Sodium 134 (*)    CO2 21 (*)    Glucose, Bld 239 (*)    Creatinine, Ser 0.56 (*)    Calcium 8.3 (*)    Total Protein 5.9 (*)    Albumin 3.2 (*)    AST 12 (*)    ALT 11 (*)    All other components within normal limits  CBC WITH DIFFERENTIAL/PLATELET - Abnormal; Notable for the following:    WBC 13.4 (*)    RBC 1.24 (*)    Hemoglobin 5.2 (*)    HCT 14.9 (*)    MCV 120.2 (*)    MCH 41.9 (*)    RDW 16.0 (*)    Platelets 461 (*)    All other components within normal limits  PROTIME-INR - Abnormal; Notable for the following:    Prothrombin Time 15.5 (*)    All other components within normal limits  OCCULT BLOOD X 1 CARD TO LAB, STOOL  POC OCCULT BLOOD, ED  I-STAT TROPOININ, ED  PREPARE RBC (CROSSMATCH)  TYPE AND SCREEN    EKG  EKG Interpretation  Date/Time:  Tuesday Dec 26 2016 12:47:40  EDT Ventricular Rate:  96 PR Interval:    QRS Duration: 104 QT Interval:  354 QTC Calculation: 448 R Axis:   15 Text Interpretation:  Sinus rhythm Nonspecific repol abnormality, inferior leads No significant change since last tracing Confirmed by Dorie Rank [phone]) on 12/26/2016 2:15:48 PM       Radiology Dg Chest 2 View  Result Date: 12/26/2016 CLINICAL DATA:  76 year old male with cough and abnormal left lower lobe pulmonary auscultation. EXAM: CHEST  2 VIEW COMPARISON:  Chest  CTA 09/05/2016 and earlier. FINDINGS: Stable lung volumes. Mediastinal contours remain normal. Calcified aortic atherosclerosis. Visualized tracheal air column is within normal limits. Chronic increased pulmonary interstitial markings with no pneumothorax, pulmonary edema, pleural effusion or confluent pulmonary opacity. Osteopenia. No acute osseous abnormality identified. Negative visible bowel gas pattern. IMPRESSION: No acute cardiopulmonary abnormality. Electronically Signed   By: Genevie Ann M.D.   On: 12/26/2016 10:53    Procedures Procedures (including critical care time)  Medications Ordered in ED Medications  0.9 %  sodium chloride infusion (10 mL/hr Intravenous New Bag/Given 12/26/16 1325)     Initial Impression / Assessment and Plan / ED Course  I have reviewed the triage vital signs and the nursing notes.  Pertinent labs & imaging results that were available during my care of the patient were reviewed by me and considered in my medical decision making (see chart for details).  Clinical Course as of Dec 27 1514  Tue Dec 26, 2016  1303 Hgb at 5.3 at the office today.  Previous was 9.2  [JK]    Clinical Course User Index [JK] Dorie Rank, MD    Patient presented to the emergency room for evaluation of anemia. Patient's hemoglobin is 5.2. Hemoccult is negative for blood. He does have a history of recurrent anemia requiring transfusions. Patient states that never to determine the exact cause.  Considering his degree of anemia and need for transfusions I will consult with the medical service for overnight admission blood transfusions, further treatment.  Final Clinical Impressions(s) / ED Diagnoses   Final diagnoses:  Symptomatic anemia    New Prescriptions New Prescriptions   No medications on file     Dorie Rank, MD 12/27/16 [phone]

## 2016-12-26 NOTE — Progress Notes (Addendum)
By signing my name below, I, Tommy Peterson, attest that this documentation has been prepared under the direction and in the presence of Tommy Ray, MD.  Electronically Signed: Verlee Peterson, Medical Scribe. 12/26/16. 10:08 AM.  Subjective:    Patient ID: Tommy Peterson, male    DOB: 1941/05/11, 76 y.o.   MRN: [id number]  HPI Chief Complaint  Patient presents with  . A1C check    HPI Comments: Tommy Peterson is a 76 y.o. male who presents to Primary Care at [facility] for DM follow-up. I have not seen him since 2014 for acute sxs. Appears he was most recently seen 10/06/16 with, Dr. [name], [facility] and seen for multiple concerns at that time. 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 Multiple other nodules; largest 1 cm. He had pleural thickening, planned for repeat CT of chest last on Jan 2018, 10 mm left lingular subpleural nodule. He had a Hgb A1c 8.6 on 10/06/16. A1c at [facility] was 8.5 2/218 that declines from 9.5 08/08/16. At [facility] his A1c was 8.0 10/06/16. Pt is not transferring his PCP and his next appt with his PCP 01/05/17 for his AWV. He has an appt every 3 months for his A1c.  Pt reports difficulty walking due to associated sxs of diffuse weakness onset 3 weeks, DOE, pallor, palpitations, and coughing for a year. Pt can hear his pulse in his right ear when he walks up 6 steps, and admits he wasn't sure he could get out of his bath tub this morning due to weakness. Pt has had "explosive" diarrhea for years with last episdoe this morning 7:15am. Takes entocort-ec for some relief of his sxs. Pt can go 3-5 days without diarrhea and he has a GI. Pt has not been on iron and was told it was up to him to increase his insulin. Pt's hematologist wanted his Hgb to be < 8 for blood transfusion and methotrexate. Pt's last  transfusion, 2 pints, was at [place] 4-5 months ago. Pt is taking 24 units of insulin a day and his blood sugar has been over 200. He reports having as new meter and when compared to his old meter it's 50-60 points off. Denies melena, chest pain, slurred speech, facial asymmetry, and focal weakness. Denies PMHx of heart disease.  Lab Results  Component Value Date   HGBA1C 8.8 (H) 05/04/2016   Patient Active Problem List   Diagnosis Date Noted  . C. difficile colitis 05/31/2016  . Lactic acidosis   . History of bone marrow biopsy 05/27/2016  . Fever 05/27/2016  . Encephalopathy in sepsis 05/27/2016  . Sepsis ([place]) 05/27/2016  . Chest pain 05/04/2016  . Benign fibroma of prostate 09/20/2015  . HLD (hyperlipidemia) 09/20/2015  . BP (high blood pressure) 09/20/2015  . Arthritis, degenerative 09/20/2015  . Colitis, collagenous 09/09/2015  . Absolute anemia 09/08/2014  . Adenoma of large intestine 07/13/2014  . Disturbance of skin sensation 01/09/2014  . Diabetes ([name]) 06/24/2013  . Recurrent major depressive disorder, in full remission ([place]) 07/18/2012  . Allergic rhinitis 08/29/2011   Past Medical History:  Diagnosis Date  . Anemia   . Cataract   . Diabetes mellitus without complication ([place])   . Macrocytosis   . Monoclonal gammopathy    Past Surgical History:  Procedure Laterality Date  . BONE MARROW BIOPSY Right  2016  . CATARACT EXTRACTION     bilateral  . HERNIA REPAIR     right inguinal  . PENILE PROSTHESIS IMPLANT    . PROSTATE SURGERY     BPH  . TONSILLECTOMY     Allergies  Allergen Reactions  . Bee Venom Swelling  . Metformin And Related Nausea And Vomiting  . Sulfamethoxazole-Trimethoprim Nausea And Vomiting   Prior to Admission medications   Medication Sig Start Date End Date Taking? Authorizing Provider  aspirin 81 MG tablet Take 81 mg by mouth daily.   Yes [provider]  budesonide (ENTOCORT EC) 3 MG 24 hr capsule Take 6 mg by mouth daily.    Yes [provider]  diazepam (VALIUM) 5 MG tablet Take 5 mg by mouth at bedtime as needed for sleep or muscle spasms. 08/08/16  Yes [provider]  LANTUS SOLOSTAR 100 UNIT/ML Solostar Pen Inject 24 Units into the skin at bedtime.  01/05/14  Yes [provider]  triamcinolone cream (KENALOG) 0.1 % Apply 1 application topically 2 (two) times daily. Patient taking differently: Apply 1 application topically 2 (two) times daily as needed (for basel cell spots on forehead).  02/14/16  Yes Jaynee Eagles, PA-C   Social History   Social History  . Marital status: Widowed    Spouse name: engaged  . Number of children: 8  . Years of education: college-2   Occupational History  . Sales   . [employer]    Social History Main Topics  . Smoking status: Former Smoker    Types: Cigarettes    Quit date: 05/04/1996  . Smokeless tobacco: Never Used     Comment: Smoked for 20 years  . Alcohol use Yes     Comment: 1-2 glasses of wine every other day  . Drug use: No  . Sexual activity: No   Other Topics Concern  . Not on file   Social History Narrative  . No narrative on file   Review of Systems  Respiratory: Positive for cough and shortness of breath.   Cardiovascular: Positive for palpitations. Negative for chest pain.  Gastrointestinal: Positive for diarrhea (chronic). Negative for abdominal pain and blood in stool.  Skin: Positive for pallor.  Neurological: Positive for weakness (diffuse). Negative for facial asymmetry and speech difficulty.   Objective:  Physical Exam  Constitutional: He appears well-developed and well-nourished. No distress.  HENT:  Head: Normocephalic and atraumatic.  Pale oral muscosa  Eyes: Conjunctivae are normal.  Neck: Neck supple.  Cardiovascular: Regular rhythm.  Tachycardia present.   Murmur heard.  Systolic (2-3) murmur is present with a grade of 3/6  Pulmonary/Chest: Effort normal.  Crackles heard in LLL  Abdominal:  Soft. There is no tenderness.  Neurological: He is alert.  Skin: Skin is warm and dry. There is pallor.  Psychiatric: He has a normal mood and affect. His behavior is normal.  Nursing note and vitals reviewed.   Vitals:   12/26/16 0925  BP: 121/62  Pulse: (!) 108  Resp: 18  Temp: 98.2 F (36.8 C)  TempSrc: Oral  SpO2: 98%  Weight: 153 lb 9.6 oz (69.7 kg)  Height: 5' 9.49" (1.765 m)  Body mass index is 22.37 kg/m.   Dg Chest 2 View  Result Date: 12/26/2016 CLINICAL DATA:  76 year old male with cough and abnormal left lower lobe pulmonary auscultation. EXAM: CHEST  2 VIEW COMPARISON:  Chest CTA 09/05/2016 and earlier. FINDINGS: Stable lung volumes. Mediastinal contours remain normal. Calcified  aortic atherosclerosis. Visualized tracheal air column is within normal limits. Chronic increased pulmonary interstitial markings with no pneumothorax, pulmonary edema, pleural effusion or confluent pulmonary opacity. Osteopenia. No acute osseous abnormality identified. Negative visible bowel gas pattern. IMPRESSION: No acute cardiopulmonary abnormality. Electronically Signed   By: Genevie Ann M.D.   On: 12/26/2016 10:53   EKG Reading: Sinus rhythm with tachycardia. Rate 101. He has ST depression V5 V6 that appears new from Jan 30th.   Results for orders placed or performed in visit on 12/26/16  POCT CBC  Result Value Ref Range   WBC 15.5 (A) 4.6 - 10.2 K/uL   Lymph, poc 5.8 (A) 0.6 - 3.4   POC LYMPH PERCENT 37.5 10 - 50 %L   MID (cbc) 0.8 0 - 0.9   POC MID % 4.9 0 - 12 %M   POC Granulocyte 8.9 (A) 2 - 6.9   Granulocyte percent 57.6 37 - 80 %G   RBC 1.26 (A) 4.69 - 6.13 M/uL   Hemoglobin 5.3 (A) 14.1 - 18.1 g/dL   HCT, POC 15.0 (A) 43.5 - 53.7 %   MCV 119.1 (A) 80 - 97 fL   MCH, POC 42.2 (A) 27 - 31.2 pg   MCHC 35.4 31.8 - 35.4 g/dL   RDW, POC 17.6 %   Platelet Count, POC 529 (A) 142 - 424 K/uL   MPV 7.4 0 - 99.8 fL  POCT glucose (manual entry)  Result Value Ref Range   POC Glucose 350  (A) 70 - 99 mg/dl   11:35 AM Results discussed with patient. He denies any chest pain or shortness of breath at rest. EMS called for transport, will place IV for access, charge nurse at [facility] ER to be advised.  Assessment & Plan:  Danie Diehl is a 75 y.o. male Weakness - Plan: POCT CBC, POCT glucose (manual entry), Comprehensive metabolic panel, DG Chest 2 View  Dyspnea on exertion - Plan: EKG 12-Lead  Heart palpitations - Plan: EKG 12-Lead  Anemia, unspecified type - Plan: POCT CBC  Leukemia not having achieved remission, unspecified leukemia type ([place])  Type 2 diabetes mellitus with hyperglycemia, with long-term current use of insulin ([place]) - Plan: Comprehensive metabolic panel  Diarrhea, unspecified type  Hyperglycemia - Plan: Comprehensive metabolic panel  History of multiple medical problems as above including diabetes, anemia with leukemia. Three-week history of progressive dyspnea on exertion, fatigue, heart palpitations, and hyperglycemia on readings at home in the 200s. Significant decrease in hemoglobin today of 5.3, and hyperglycemia as above. EKG with new ST depression V5 V6. Possible strain with current anemia, Versus anterior septal infarct as listed on EKG. Likely will need transfusion as well as further cardiac evaluation/enzymes. Additionally evaluation for hyperglycemia including bicarbonate/electrolytes will likely need to be done through the emergency room.   -EMS called for transport as above, IV placed, Oxygen and blood pressure are stable in the office. No medication was given.  11:53 AM Report given to EMS with transfer of care, charge nurse advised at [facility] emergency room No orders of the defined types were placed in this encounter.  Patient Instructions     Follow up with your primary care provider for A1c as you are slightly early for that testing and can be done at your upcoming appointment.    IF you received an x-Peterson today, you  will receive an invoice from [facility]. Please contact [facility] at [phone] with questions or concerns regarding your invoice.   IF  you received labwork today, you will receive an invoice from [place]. Please contact [facility] at [phone] with questions or concerns regarding your invoice.   Our billing staff will not be able to assist you with questions regarding bills from these companies.  You will be contacted with the lab results as soon as they are available. The fastest way to get your results is to activate your My Chart account. Instructions are located on the last page of this paperwork. If you have not heard from [place] regarding the results in 2 weeks, please contact this office.       I personally performed the services described in this documentation, which was scribed in my presence. The recorded information has been reviewed and considered for accuracy and completeness, addended by me as needed, and agree with information above.  Signed,   Tommy Ray, MD Primary Care at [place].  12/26/16 11:36 AM

## 2016-12-26 NOTE — Patient Instructions (Addendum)
Your anemia is much worse today, and with EKG changes and high blood sugar, further evaluation will need to be done through the hospital and likely blood transfusion.  Follow up with your primary care provider for A1c as you are slightly early for that testing and can be done at your upcoming appointment.    IF you received an x-ray today, you will receive an invoice from [facility]. Please contact [facility] at [phone] with questions or concerns regarding your invoice.   IF you received labwork today, you will receive an invoice from [place]. Please contact [facility] at [phone] with questions or concerns regarding your invoice.   Our billing staff will not be able to assist you with questions regarding bills from these companies.  You will be contacted with the lab results as soon as they are available. The fastest way to get your results is to activate your My Chart account. Instructions are located on the last page of this paperwork. If you have not heard from [place] regarding the results in 2 weeks, please contact this office.

## 2016-12-26 NOTE — H&P (Signed)
[facility] History and Physical  Caz Weaver VEH:[id number] DOB: Feb 22, 1941 DOA: 12/26/2016  Referring physician: Dr. Dorie Rank ([place]) PCP: Janett Billow, MD [facility] in [facility] Hematology: Dr [name] [facility])  Chief Complaint: Weakness, shortness of breath  HPI: Tommy Peterson is a 76 y.o. male with pmh IDDM, LGL leukemia, chronic anemia with baseline hgb ~8.0, chronic diarrhea on Entocort initially presented to [facility] with 3 week history of weakness with progressive shortness of breath. Pt states he typically has hemoglobin checked q6weeks, but was unable to make last appointment. Per pt, last hgb in 10/2016 7.9. Pt was sent to ED after hgb found to be 5.3 at office.   Pt denies any dark, tarry stool. States he has had chronic diarrhea for 10 years and has some relief with Entocort. His last colonoscopy was done 4-5 years ago showing polyps. Pt states that he will NEVER have another colonoscopy regardless of circumstances. He denies recent headache, dizziness, chest pain, abdominal pain, nausea or vomiting, no melena, hematochezia or hematemesis.   ED COURSE Labs done in ED show hgb 5.2, WBC 13.4 with ANC 5.3, glucose 239 otherwise unremarkable. FOB negative. No significant findings on ekg. Chest xray unremarkable. 2 units PRBCs ordered to be transfused. Pt had just finished 1st unit upon admission evaluation.   Review of Systems:  As stated in hpi, otherwise negative   Past Medical History:  Diagnosis Date  . Anemia   . Cataract   . Diabetes mellitus without complication ([place])   . Macrocytosis   . Monoclonal gammopathy     Social History:  reports that he quit smoking about 20 years ago. His smoking use included Cigarettes. He has never used smokeless tobacco. He reports that he drinks alcohol. He reports that he does not use drugs. Has fiance, 2 grown children and 2 daughters in college as well as 2 adopted daughters ages 47 & 11.   Allergies    Allergen Reactions  . Bee Venom Swelling  . Metformin And Related Nausea And Vomiting  . Sulfamethoxazole-Trimethoprim Nausea And Vomiting    Family History  Problem Relation Age of Onset  . Diabetes Mother   . Heart failure Mother      Prior to Admission medications   Medication Sig Start Date End Date Taking? Authorizing Provider  aspirin 81 MG tablet Take 81 mg by mouth daily.   Yes [provider]  budesonide (ENTOCORT EC) 3 MG 24 hr capsule Take 9 mg by mouth daily.    Yes [provider]  diazepam (VALIUM) 5 MG tablet Take 5 mg by mouth at bedtime as needed for sleep or muscle spasms. 08/08/16  Yes [provider]  LANTUS SOLOSTAR 100 UNIT/ML Solostar Pen Inject 20 Units into the skin at bedtime.  01/05/14  Yes [provider]  triamcinolone cream (KENALOG) 0.1 % Apply 1 application topically 2 (two) times daily. Patient taking differently: Apply 1 application topically 2 (two) times daily as needed (for basel cell spots on forehead). Pt applies for ten days, then stops and repeats 02/14/16  Yes Jaynee Eagles, [place]   Physical Exam: Vitals:   12/26/16 1400 12/26/16 1441 12/26/16 1445 12/26/16 1500  BP: (!) 117/58 110/61 115/63 122/63  Pulse: 95 93 91 80  Resp: 20 16 18 18   Temp:  98.5 F (36.9 C)  98.5 F (36.9 C)  TempSrc:  Oral  Oral  SpO2: 100% 100% 100% 100%  Weight:      Height:  Wt Readings from Last 3 Encounters:  12/26/16 69.4 kg (153 lb)  12/26/16 69.7 kg (153 lb 9.6 oz)  09/11/16 71.2 kg (157 lb)    General: calm, comfortable, pale appearing in NAD Eyes: EOMI, normal lids, iris ENT: grossly normal hearing, lips & tongue Neck: no LAD, masses or thyromegaly Cardiovascular: S1S2 RRR, no m/r/g. No LE edema.  Respiratory: CTA bilaterally, no w/r/r. Normal respiratory effort. Abdomen: soft, ntnd, BS+ Skin: no rash or induration seen on limited exam Musculoskeletal: grossly normal tone BUE/BLE Psychiatric: grossly normal  mood and affect, speech fluent and appropriate Neurologic: CN 2-12 grossly intact          Labs on Admission:  Basic Metabolic Panel:  Recent Labs Lab 12/26/16 1308  NA 134*  K 3.5  CL 106  CO2 21*  GLUCOSE 239*  BUN 13  CREATININE 0.56*  CALCIUM 8.3*   Liver Function Tests:  Recent Labs Lab 12/26/16 1308  AST 12*  ALT 11*  ALKPHOS 74  BILITOT 1.1  PROT 5.9*  ALBUMIN 3.2*   No results for input(s): LIPASE, AMYLASE in the last 168 hours. No results for input(s): AMMONIA in the last 168 hours. CBC:  Recent Labs Lab 12/26/16 1116 12/26/16 1308  WBC 15.5* 13.4*  NEUTROABS  --  7.0  HGB 5.3* 5.2*  HCT 15.0* 14.9*  MCV 119.1* 120.2*  PLT  --  461*   Cardiac Enzymes: No results for input(s): CKTOTAL, CKMB, CKMBINDEX, TROPONINI in the last 168 hours.  BNP (last 3 results) No results for input(s): BNP in the last 8760 hours.  ProBNP (last 3 results) No results for input(s): PROBNP in the last 8760 hours.   Serum creatinine: 0.56 mg/dL (L) 12/26/16 1308 Estimated creatinine clearance: 77.1 mL/min (A)  CBG: No results for input(s): GLUCAP in the last 168 hours.  Radiological Exams on Admission: Dg Chest 2 View  Result Date: 12/26/2016 CLINICAL DATA:  76 year old male with cough and abnormal left lower lobe pulmonary auscultation. EXAM: CHEST  2 VIEW COMPARISON:  Chest CTA 09/05/2016 and earlier. FINDINGS: Stable lung volumes. Mediastinal contours remain normal. Calcified aortic atherosclerosis. Visualized tracheal air column is within normal limits. Chronic increased pulmonary interstitial markings with no pneumothorax, pulmonary edema, pleural effusion or confluent pulmonary opacity. Osteopenia. No acute osseous abnormality identified. Negative visible bowel gas pattern. IMPRESSION: No acute cardiopulmonary abnormality. Electronically Signed   By: Genevie Ann M.D.   On: 12/26/2016 10:53    EKG: Independently reviewed. NSR 91bpm. No changes when compared to ekg  09/05/16  Assessment/Plan Active Problems:   Diabetes (HCC)   Absolute anemia   Anemia   Large granular lymphocytic leukemia (HCC)   Anemia, symptomatic  -chronic macrocytic disease with leukemia (baseline hgb ~8.0). No evidence of ABL on exam, fob negative and pt adamantly refuses GI intervention. -transfuse 2 units PRBCs for now. Monitor hgb to determine need to further transfuse (goal ~8) -cycle troponins   IDDM -hgb A1C 8.6 on 10/06/16 -continue Lantus at home dose -SSI while inpt  Leukocytosis, chronic -no evidence of infection, VSS, NT appearing. Chest xray unremarkable  LGL Leukemia -outpt per Dr [name] [facility])    Code Status: full code  DVT Prophylaxis: SCDs Family Communication: no family available at time of admission Disposition Plan: Pending Improvement    Dionne Milo, NP [phone] [facility] [email or web address] Password [email or web address]

## 2016-12-27 DIAGNOSIS — R0609 Other forms of dyspnea: Secondary | ICD-10-CM

## 2016-12-27 DIAGNOSIS — F419 Anxiety disorder, unspecified: Secondary | ICD-10-CM

## 2016-12-27 DIAGNOSIS — Z794 Long term (current) use of insulin: Secondary | ICD-10-CM | POA: Diagnosis not present

## 2016-12-27 DIAGNOSIS — D649 Anemia, unspecified: Principal | ICD-10-CM

## 2016-12-27 DIAGNOSIS — R9431 Abnormal electrocardiogram [ECG] [EKG]: Secondary | ICD-10-CM | POA: Diagnosis not present

## 2016-12-27 DIAGNOSIS — E118 Type 2 diabetes mellitus with unspecified complications: Secondary | ICD-10-CM | POA: Diagnosis not present

## 2016-12-27 DIAGNOSIS — R69 Illness, unspecified: Secondary | ICD-10-CM | POA: Diagnosis not present

## 2016-12-27 DIAGNOSIS — C91Z0 Other lymphoid leukemia not having achieved remission: Secondary | ICD-10-CM

## 2016-12-27 DIAGNOSIS — E1165 Type 2 diabetes mellitus with hyperglycemia: Secondary | ICD-10-CM

## 2016-12-27 LAB — BASIC METABOLIC PANEL
Anion gap: 6 (ref 5–15)
BUN: 11 mg/dL (ref 6–20)
CHLORIDE: 107 mmol/L (ref 101–111)
CO2: 22 mmol/L (ref 22–32)
CREATININE: 0.54 mg/dL — AB (ref 0.61–1.24)
Calcium: 8.3 mg/dL — ABNORMAL LOW (ref 8.9–10.3)
GFR calc Af Amer: >60 mL/min (ref >60)
GFR calc non Af Amer: >60 mL/min (ref >60)
GLUCOSE: 222 mg/dL — AB (ref 65–99)
POTASSIUM: 4.3 mmol/L (ref 3.5–5.1)
SODIUM: 135 mmol/L (ref 135–145)

## 2016-12-27 LAB — CBC
HCT: 20.8 % — ABNORMAL LOW (ref 39.0–52.0)
Hemoglobin: 7.2 g/dL — ABNORMAL LOW (ref 13.0–17.0)
MCH: 36.4 pg — ABNORMAL HIGH (ref 26.0–34.0)
MCHC: 34.6 g/dL (ref 30.0–36.0)
MCV: 105.1 fL — ABNORMAL HIGH (ref 78.0–100.0)
Platelets: 417 10*3/uL — ABNORMAL HIGH (ref 150–400)
RBC: 1.98 MIL/uL — ABNORMAL LOW (ref 4.22–5.81)
WBC: 13.2 10*3/uL — ABNORMAL HIGH (ref 4.0–10.5)

## 2016-12-27 LAB — SAVE SMEAR

## 2016-12-27 LAB — IRON AND TIBC
Iron: 174 ug/dL (ref 45–182)
Saturation Ratios: 69 % — ABNORMAL HIGH (ref 17.9–39.5)
TIBC: 251 ug/dL (ref 250–450)
UIBC: 77 ug/dL

## 2016-12-27 LAB — LACTATE DEHYDROGENASE: LDH: 118 U/L (ref 98–192)

## 2016-12-27 LAB — HEMOGLOBIN AND HEMATOCRIT, BLOOD
HCT: 24.5 % — ABNORMAL LOW (ref 39.0–52.0)
Hemoglobin: 8.2 g/dL — ABNORMAL LOW (ref 13.0–17.0)

## 2016-12-27 LAB — PREPARE RBC (CROSSMATCH)

## 2016-12-27 LAB — FOLATE: Folate: 18.9 ng/mL (ref >5.9)

## 2016-12-27 LAB — FERRITIN: Ferritin: 245 ng/mL (ref 24–336)

## 2016-12-27 LAB — VITAMIN B12: VITAMIN B 12: 625 pg/mL (ref 180–914)

## 2016-12-27 LAB — TROPONIN I

## 2016-12-27 LAB — GLUCOSE, CAPILLARY
GLUCOSE-CAPILLARY: 176 mg/dL — AB (ref 65–99)
GLUCOSE-CAPILLARY: 287 mg/dL — AB (ref 65–99)

## 2016-12-27 LAB — RETICULOCYTES
RBC.: 1.89 MIL/uL — ABNORMAL LOW (ref 4.22–5.81)
RETIC CT PCT: 1 % (ref 0.4–3.1)
Retic Count, Absolute: 18.9 10*3/uL — ABNORMAL LOW (ref 19.0–186.0)

## 2016-12-27 MED ORDER — SODIUM CHLORIDE 0.9 % IV SOLN: Freq: Once | INTRAVENOUS | Status: DC

## 2016-12-27 MED ORDER — INSULIN ASPART 100 UNIT/ML ~~LOC~~ SOLN: 0.0000 [IU] | Freq: Three times a day (TID) | SUBCUTANEOUS | Status: DC

## 2016-12-27 MED ORDER — INSULIN ASPART 100 UNIT/ML ~~LOC~~ SOLN
0.0000 [IU] | Freq: Every day | SUBCUTANEOUS | Status: DC
Start: 2016-12-27 — End: 2016-12-27

## 2016-12-27 NOTE — Progress Notes (Signed)
Tommy Peterson  [phone]   HEMATOLOGY ONCOLOGY INPATIENT CONSULTATION   Tommy Peterson  DOB: 17-Dec-1940  MR#: [id number]  CSN#: [id number]    Requesting Physician: [facility]  Patient Care Team: Janett Billow, MD as PCP - General (Family Medicine) Collier Bullock, MD as Referring Physician (Hematology and Oncology)  Reason for consult:  Large Granular Lymphocytosis leukemia   History of present illness:    He presents to [facility] on 12/27/16 upon being admitted yesterday for symptomatic anemia. 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 Annabelle Peterson. He had 2 bone marrow biopsies not by Dr. Annabelle Peterson but at [facility] where he was referred. Dr. Annabelle Peterson made a diagnosis of LGL Leukemia based of [facility] film and records. He is bothered that he did not do another bone marrow biopsy.   He has a history of chronic diarrhea for 10 years and a colonoscopy. He was on Intercort. He also has a history of DM, controlled. He sees his PCP every 90 days. He could not see his last appointment and then he was released form their care. He was feeling worse again but did not go in to see his PCP. He also had C-Diff last year and was in the hospital for days.He also reports to having a nodule on his thyroid that he will f/u with Dr Tommy Peterson on.   Today he reports he has an appointment this Tuesday morning. For the past 2 years his Hbg drops and over the past 7 months had 4 Blood transfusion, last was 6 months ago. His anemia has half that are 50% larger than the rest. He wonders if the Leukemia effects his Hbg or if he has other issues. He was told by Dr. Annabelle Peterson that he should not start Methotrexate unless his Hbg drops below 8. He told the Doctor it was 7.8, but was not started on the medication. So he repositioned starting methotrexate after he gets back above. He does have a RA and has a f/u appointment with them  soon. He will see his GP the first of June. He is changing to Dr. Jani Peterson as his new PCP. He did not have Chest pain with his symptoms. He reports Dr. Laureen Ochs wanted him to do labs every week, but patient feels it is not realistic.   He is a [place] Veteran. His youngest kid is 80 and a 48 and twins all adopted from [place]. He has his own four adult children. His wife passed away 5 years ago and he has a girlfriend so he has the sole responsibility for the children. His girlfriend was also hospitalized this past year as well. He is adamant about getting a concrete plan for treatment so he can live out his life for his children. He previously did not know he could use VA benefits so he will start using them through Dr. Maudie Peterson.    MEDICAL HISTORY:  Past Medical History:  Diagnosis Date  . Anemia   . Cataract   . Diabetes mellitus without complication ([place])   . Macrocytosis   . Monoclonal gammopathy     SURGICAL HISTORY: Past Surgical History:  Procedure Laterality Date  . BONE MARROW BIOPSY Right 2016  . CATARACT EXTRACTION     bilateral  . HERNIA REPAIR     right inguinal  . PENILE PROSTHESIS IMPLANT    . PROSTATE SURGERY     BPH  .  TONSILLECTOMY      SOCIAL HISTORY: Social History   Social History  . Marital status: Widowed    Spouse name: engaged  . Number of children: 8  . Years of education: college-2   Occupational History  . Sales   . [employer]    Social History Main Topics  . Smoking status: Former Smoker    Types: Cigarettes    Quit date: 05/04/1996  . Smokeless tobacco: Never Used     Comment: Smoked for 20 years  . Alcohol use Yes     Comment: 1-2 glasses of wine every other day  . Drug use: No  . Sexual activity: No   Other Topics Concern  . Not on file   Social History Narrative  . No narrative on file    FAMILY HISTORY: Family History  Problem Relation Age of Onset  . Diabetes Mother   . Heart failure Mother      ALLERGIES:  is allergic to bee venom; metformin and related; and sulfamethoxazole-trimethoprim.  MEDICATIONS:  Current Facility-Administered Medications  Medication Dose Route Frequency Provider Last Rate Last Dose  . 0.9 %  sodium chloride infusion  250 mL Intravenous PRN Dionne Milo, NP      . 0.9 %  sodium chloride infusion   Intravenous Once Rai, Ripudeep K, MD      . acetaminophen (TYLENOL) tablet 650 mg  650 mg Oral Q6H PRN Dionne Milo, NP       Or  . acetaminophen (TYLENOL) suppository 650 mg  650 mg Rectal Q6H PRN Dionne Milo, NP      . aspirin EC tablet 81 mg  81 mg Oral Daily Dionne Milo, NP   81 mg at 12/27/16 0924  . budesonide (ENTOCORT EC) 24 hr capsule 9 mg  9 mg Oral Daily Dionne Milo, NP   9 mg at 12/27/16 0924  . diazepam (VALIUM) tablet 5 mg  5 mg Oral QHS PRN Dionne Milo, NP      . insulin aspart (novoLOG) injection 0-5 Units  0-5 Units Subcutaneous QHS Rai, Ripudeep K, MD      . insulin aspart (novoLOG) injection 0-9 Units  0-9 Units Subcutaneous TID WC Dionne Milo, NP   5 Units at 12/27/16 1300  . insulin glargine (LANTUS) injection 20 Units  20 Units Subcutaneous QHS Dionne Milo, NP   20 Units at 12/26/16 2036  . ondansetron (ZOFRAN) tablet 4 mg  4 mg Oral Q6H PRN Dionne Milo, NP       Or  . ondansetron [facility]) injection 4 mg  4 mg Intravenous Q6H PRN Dionne Milo, NP      . sodium chloride flush (NS) 0.9 % injection 3 mL  3 mL Intravenous Q12H Dionne Milo, NP   3 mL at 12/27/16 1000  . sodium chloride flush (NS) 0.9 % injection 3 mL  3 mL Intravenous Q12H Dionne Milo, NP   3 mL at 12/27/16 0925  . sodium chloride flush (NS) 0.9 % injection 3 mL  3 mL Intravenous PRN Dionne Milo, NP   3 mL at 12/26/16 2034    REVIEW OF SYSTEMS:   Constitutional: Denies fevers, chills or abnormal night sweats Eyes: Denies blurriness of vision, double vision or watery  eyes Ears, nose, mouth, throat, and face: Denies mucositis or sore throat Respiratory: Denies cough, dyspnea or wheezes Cardiovascular: Denies palpitation, chest discomfort or lower extremity swelling Gastrointestinal:  Denies nausea, heartburn or change in bowel habits Skin: Denies abnormal skin rashes Lymphatics: Denies new lymphadenopathy or easy bruising Neurological:Denies numbness, tingling or new weaknesses Behavioral/Psych: Mood is stable, no new changes  All other systems were reviewed with the patient and are negative.  PHYSICAL EXAMINATION: ECOG PERFORMANCE STATUS: 1 - Symptomatic but completely ambulatory  Vitals:   12/27/16 1052 12/27/16 1304  BP: 124/64 124/64  Pulse: 91 80  Resp: 18 18  Temp: 98.2 F (36.8 C) 98.5 F (36.9 C)   Filed Weights   12/26/16 1249 12/26/16 2030  Weight: 153 lb (69.4 kg) 154 lb 8.7 oz (70.1 kg)    GENERAL:alert, no distress and comfortable SKIN: skin color, texture, turgor are normal, no rashes or significant lesions EYES: normal, conjunctiva are pink and non-injected, sclera clear OROPHARYNX:no exudate, no erythema and lips, buccal mucosa, and tongue normal  NECK: supple, thyroid normal size, non-tender, without nodularity LYMPH:  no palpable lymphadenopathy in the cervical, axillary or inguinal LUNGS: clear to auscultation and percussion with normal breathing effort HEART: regular rate & rhythm and no murmurs and no lower extremity edema ABDOMEN:abdomen soft, non-tender and normal bowel sounds, no organomegaly  Musculoskeletal:no cyanosis of digits and no clubbing  PSYCH: alert & oriented x 3 with fluent speech NEURO: no focal motor/sensory deficits  LABORATORY DATA:  I have reviewed the data as listed CBC Latest Ref Rng & Units 12/27/2016 12/27/2016 12/26/2016  WBC 4.0 - 10.5 K/uL - 13.2(H) 11.4(H)  Hemoglobin 13.0 - 17.0 g/dL 8.2(L) 7.2(L) 6.8(LL)  Hematocrit 39.0 - 52.0 % 24.5(L) 20.8(L) 20.4(L)  Platelets 150 - 400 K/uL -  417(H) 423(H)    Recent Labs  08/21/16 1538  12/26/16 1040 12/26/16 1308 12/27/16 0535  NA 133*  < > 136 134* 135  K 3.8  < > 4.4 3.5 4.3  CL 102  < > 102 106 107  CO2 24  --  19 21* 22  GLUCOSE 242*  < > 311* 239* 222*  BUN 12  < > '15 13 11  '$ CREATININE 0.40*  < > 0.56* 0.56* 0.54*  CALCIUM 8.3*  --  8.8 8.3* 8.3*  GFRNONAA >60  --  100 >60 >60  GFRAA >60  --  116 >60 >60  PROT 6.3*  --  6.0 5.9*  --   ALBUMIN 3.2*  --  3.5 3.2*  --   AST 11*  --  7 12*  --   ALT 10*  --  8 11*  --   ALKPHOS 72  --  85 74  --   BILITOT 0.8  --  0.7 1.1  --   < > = values in this interval not displayed.  PATHOLOGY  BONE MARROW BIOPSY 08/20/2014 [id number] DIAGNOSIS  BONE MARROW CORE BIOPSY, CLOT SECTION, ASPIRATE AND PERIPHERAL BLOOD SMEAR:  DIAGNOSIS:  - MILDLY HYPERCELLULAR MARROW FOR AGE WITH MILD MYELOID HYPERPLASIA  - NEGATIVE FOR FEATURES OF A MYELODYSPLASTIC NEOPLASM  - NEGATIVE FOR A CLONAL PLASMA CELL POPULATION  - BLOOD, MACROCYTIC ANEMIA WITH MILD LEUKOCYTOSIS  - ABUNDANT (4+) MARROW STORAGE IRON    RADIOGRAPHIC STUDIES: I have personally reviewed the radiological images as listed and agreed with the findings in the report. Dg Chest 2 View  Result Date: 12/26/2016 CLINICAL DATA:  76 year old male with cough and abnormal left lower lobe pulmonary auscultation. EXAM: CHEST  2 VIEW COMPARISON:  Chest CTA 09/05/2016 and earlier. FINDINGS: Stable lung volumes. Mediastinal contours remain normal. Calcified aortic atherosclerosis. Visualized tracheal air column  is within normal limits. Chronic increased pulmonary interstitial markings with no pneumothorax, pulmonary edema, pleural effusion or confluent pulmonary opacity. Osteopenia. No acute osseous abnormality identified. Negative visible bowel gas pattern. IMPRESSION: No acute cardiopulmonary abnormality. Electronically Signed   By: Genevie Ann M.D.   On: 12/26/2016 10:53    ASSESSMENT & PLAN:  Montrice Gracey is a 76 y.o. male  with a history of transfusion dependent anemia, macrocytosis, HTN, DM, and HLD. He was admitted to the hospital on 12/26/2016 for symptomatic anemia.   1. Large Granular Lymphocytosis Leukemia  -I reviewed his outside records extensively.  -He had extensive hematological work out at [facility] and [place] including two bone marrow biopsy, and was diagnosed with T-cell CD8+ LGL leukemia at [facility] in 09/2016, follow up with Dr. Annabelle Peterson -I reviewed his peripheral blood smear here, which showed abundant large lymphocytes. The morphology of neutrophils, platelets and RBC are unremarkable.  -His lab work since admission showed no evidence of hemolysis, macrocytic anemia, no evidence of nutritional anemia. -Methotrexate was suggested by Dr. Annabelle Peterson when his Hbg drops below 8, he has not started, but would like to start methotrexate after his followup visit next week, since he has developed severe worsening anemia  -We discussed other line treatments that are immunosuppressants if MTX does not work for him.  -We discussed that he likely need frequent lab to monitor his blood counts, and transfuse as needed. I told him that he can get the lab and blood transfusion if needed in our cancer center, while he sees Dr. Annabelle Peterson at [facility], he appreciated it.  -He knows to call me if he needs lab and or follow-up appointment at our cancer center.  -OK to discharge home after 3 u of RBC transfusion    Recommendations:  I recommend he follows up with Dr. Laureen Ochs for a full treatment plan for his LGL Leukemia. And he can come back to [place] for labs and blood transfusions as needed. I suggest he start treatment as soon as he can and monitor lab closely. OK to discharge after transfusion    All questions were answered. The patient knows to call the clinic with any problems, questions or concerns.      Truitt Merle, MD 12/27/2016 2:30 PM

## 2016-12-27 NOTE — Progress Notes (Signed)
Inpatient Diabetes Program Recommendations  AACE/ADA: New Consensus Statement on Inpatient Glycemic Control (2015)  Target Ranges:  Prepandial:   less than 140 mg/dL      Peak postprandial:   less than 180 mg/dL (1-2 hours)      Critically ill patients:  140 - 180 mg/dL   Review of Glycemic Control  Diabetes history: DM 2 Outpatient Diabetes medications: Lantus 20 units Current orders for Inpatient glycemic control: lantus 20 units, Novolog Sensitive Correction tid  Inpatient Diabetes Program Recommendations:    Diet currently regular, Consider Carb Modified.  Thanks,  Tama Headings RN, MSN, [facility] Inpatient Diabetes Coordinator Team Pager [phone] (8a-5p)

## 2016-12-27 NOTE — Discharge Summary (Signed)
Physician Discharge Summary   Patient ID: Tommy Peterson MRN: [id number] DOB/AGE: 1941-05-26 76 y.o.  Admit date: 12/26/2016 Discharge date: 12/27/2016  Primary Care Physician:  Janett Billow, MD  Discharge Diagnoses:    . symptomatic macrocytic anemia large granular lymphocytic leukemia IDDM Chronic diarrhea Chronic leukocytosis      Consults: Hematology, [name]   Recommendations for Outpatient Follow-up:   1. Please repeat CBC/BMET at next visit    DIET: HEART HEALTHY DIET     Allergies:   Allergies  Allergen Reactions  . Bee Venom Swelling  . Metformin And Related Nausea And Vomiting  . Sulfamethoxazole-Trimethoprim Nausea And Vomiting     DISCHARGE MEDICATIONS: Current Discharge Medication List    CONTINUE these medications which have NOT CHANGED   Details  aspirin 81 MG tablet Take 81 mg by mouth daily.    budesonide (ENTOCORT EC) 3 MG 24 hr capsule Take 9 mg by mouth daily.     diazepam (VALIUM) 5 MG tablet Take 5 mg by mouth at bedtime as needed for sleep or muscle spasms.    LANTUS SOLOSTAR 100 UNIT/ML Solostar Pen Inject 20 Units into the skin at bedtime.     triamcinolone cream (KENALOG) 0.1 % Apply 1 application topically 2 (two) times daily. Qty: 30 g, Refills: 0   Associated Diagnoses: Contact dermatitis         Brief H and P: For complete details please refer to admission H and P, but in brief Patient is a 76 year old male with insulin-dependent diabetes, LGL leukemia, chronic anemia with baseline hgb ~8.0, chronic diarrhea on Entocort initially presented to [facility] with 3 week history of weakness with progressive shortness of breath. Pt states he typically has hemoglobin checked q6weeks, but was unable to make last appointment. Per pt, last hgb in 10/2016 7.9. Pt was sent to ED after hgb found to be 5.3 at office.  Pt denied any dark, tarry stool. States he has had chronic diarrhea for 10 years and has some relief with  Entocort. His last colonoscopy was done 4-5 years ago showing polyps. Pt states that he will NEVER have another colonoscopy regardless of circumstances. He denied recent headache, dizziness, chest pain, abdominal pain, nausea or vomiting, no melena, hematochezia or hematemesis.  Labs done in ED show hgb 5.2, WBC 13.4 with ANC 5.3, glucose 239 otherwise unremarkable. FOB negative. No significant findings on ekg. Chest xray unremarkable.   Hospital Course:  Symptomatic anemia, Macrocytic with underlying history of large granular lymphocytic leukemia - Chronic macrocytic anemia transfusion depended with leukemia, baseline hemoglobin around 8. - No acute blood loss, FOBT negative. Patient has refused GI intervention. - Vitamin B12 625, iron studies within normal limits, per day, low 18.9. Differential showed elliptocytes. LDH 118. - Hematology was consulted, Dr. Burr Medico.  - Patient was transfused 2 units packed RBCs, overnight hemoglobin 7.2 this morning, transfuse 1 unit, total of 3 units. Repeat Hb 8.2.   - [name] agreed with current management and recommended that he follows up with Dr [name] for a full treatment plan for his LGL leukemia, He can come back to [facility] for monthly labs and blood transfusion as needed.    IDDM Hemoglobin A1c 8.6 on 10/06/16 Continue Lantus, sliding scale insulin  Chronic leukocytosis, underlying history of large granular lymphocytic leukemia - No evidence of infection, CXR unremarkable  LGL leukemia - Outpatient workup per Dr [name] at [facility]   Chronic diarrhea Continue Entocort   Day of Discharge BP 124/64  Pulse 80   Temp 98.5 F (36.9 C) (Oral)   Resp 18   Ht 5\' 10"  (1.778 m)   Wt 70.1 kg (154 lb 8.7 oz)   SpO2 97%   BMI 22.17 kg/m   Physical Exam: General: Alert and awake oriented x3 not in any acute distress. HEENT: anicteric sclera, pupils reactive to light and accommodation CVS: S1-S2 clear no murmur rubs or gallops Chest: clear to  auscultation bilaterally, no wheezing rales or rhonchi Abdomen: soft nontender, nondistended, normal bowel sounds Extremities: no cyanosis, clubbing or edema noted bilaterally Neuro: Cranial nerves II-XII intact, no focal neurological deficits   The results of significant diagnostics from this hospitalization (including imaging, microbiology, ancillary and laboratory) are listed below for reference.    LAB RESULTS: Basic Metabolic Panel:  Recent Labs Lab 12/26/16 1308 12/27/16 0535  NA 134* 135  K 3.5 4.3  CL 106 107  CO2 21* 22  GLUCOSE 239* 222*  BUN 13 11  CREATININE 0.56* 0.54*  CALCIUM 8.3* 8.3*   Liver Function Tests:  Recent Labs Lab 12/26/16 1040 12/26/16 1308  AST 7 12*  ALT 8 11*  ALKPHOS 85 74  BILITOT 0.7 1.1  PROT 6.0 5.9*  ALBUMIN 3.5 3.2*   No results for input(s): LIPASE, AMYLASE in the last 168 hours. No results for input(s): AMMONIA in the last 168 hours. CBC:  Recent Labs Lab 12/26/16 1308 12/26/16 2253 12/27/16 0535 12/27/16 1544  WBC 13.4* 11.4* 13.2*  --   NEUTROABS 7.0  --   --   --   HGB 5.2* 6.8* 7.2* 8.2*  HCT 14.9* 20.4* 20.8* 24.5*  MCV 120.2* 105.2* 105.1*  --   PLT 461* 423* 417*  --    Cardiac Enzymes:  Recent Labs Lab 12/26/16 2253 12/27/16 0535  TROPONINI <0.03 <0.03   BNP: Invalid input(s): POCBNP CBG:  Recent Labs Lab 12/27/16 0752 12/27/16 1213  GLUCAP 176* 287*    Significant Diagnostic Studies:  Dg Chest 2 View  Result Date: 12/26/2016 CLINICAL DATA:  76 year old male with cough and abnormal left lower lobe pulmonary auscultation. EXAM: CHEST  2 VIEW COMPARISON:  Chest CTA 09/05/2016 and earlier. FINDINGS: Stable lung volumes. Mediastinal contours remain normal. Calcified aortic atherosclerosis. Visualized tracheal air column is within normal limits. Chronic increased pulmonary interstitial markings with no pneumothorax, pulmonary edema, pleural effusion or confluent pulmonary opacity. Osteopenia. No  acute osseous abnormality identified. Negative visible bowel gas pattern. IMPRESSION: No acute cardiopulmonary abnormality. Electronically Signed   By: Genevie Ann M.D.   On: 12/26/2016 10:53    2D ECHO:   Disposition and Follow-up:    DISPOSITION: home    [place]    Jani Gravel, MD. Schedule an appointment as soon as possible for a visit in 2 week(s).   Specialty:  Internal Medicine Contact information: [address] [place] [address] [phone]        Pieter Partridge, MD. Schedule an appointment as soon as possible for a visit in 2 week(s).   Specialty:  Hematology Contact information: [place] [address] [phone]            Time spent on Discharge: 34mins   Signed:   Estill Cotta M.D. [facility] 12/27/2016, [place] PM Pager: [phone]

## 2016-12-27 NOTE — Progress Notes (Signed)
Patient Discharge: Disposition: Patient discharged to home. Education: Reviewed the medications, follow-up appointments and discharge instructions, understood and acknowledged. IV: Discontinued IV before discharge. Telemetry: Discontinued before discharge. Transportation: Patient escorted out of the unit in w/c. Belongings: Patient took all his belongings with him.

## 2016-12-27 NOTE — Care Management Obs Status (Signed)
[place] NOTIFICATION   Patient Details  Name: Tommy Peterson MRN: [id number] Date of Birth: 24-Feb-1941   Medicare Observation Status Notification Given:  Yes    Dorla Guizar, Rory Percy, RN 12/27/2016, 12:00 PM

## 2016-12-27 NOTE — Progress Notes (Signed)
[facility]                                                                              Patient Demographics  Tommy Peterson, is a 76 y.o. male, DOB - [birth year], LXB:[id number]  Admit date - 12/26/2016   Admitting Physician Waldemar Dickens, MD  Outpatient Primary MD for the patient is Janett Billow, MD  Outpatient specialists:   LOS - 0  days    Chief Complaint  Patient presents with  . Weakness       Brief summary   Patient is a 76 year old male with insulin-dependent diabetes, LGL leukemia, chronic anemia with baseline hgb ~8.0, chronic diarrhea on Entocort initially presented to [facility] with 3 week history of weakness with progressive shortness of breath. Pt states he typically has hemoglobin checked q6weeks, but was unable to make last appointment. Per pt, last hgb in 10/2016 7.9. Pt was sent to ED after hgb found to be 5.3 at office.  Pt denied any dark, tarry stool. States he has had chronic diarrhea for 10 years and has some relief with Entocort. His last colonoscopy was done 4-5 years ago showing polyps. Pt states that he will NEVER have another colonoscopy regardless of circumstances. He denied recent headache, dizziness, chest pain, abdominal pain, nausea or vomiting, no melena, hematochezia or hematemesis.  Labs done in ED show hgb 5.2, WBC 13.4 with ANC 5.3, glucose 239 otherwise unremarkable. FOB negative. No significant findings on ekg. 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 - No acute blood loss, FOBT negative. Patient has refused GI intervention. - Vitamin B12 625, iron studies within normal limits, per day, low 18.9. Differential showed elliptocytes. LDH 118. - Hematology consult called, discussed with Dr. Burr Medico -  Patient was transfused 2 units packed RBCs, overnight hemoglobin 7.2 this morning, transfuse 1 unit  Active Problems: IDDM Hemoglobin A1c 8.6 on 10/06/16 Continue Lantus, sliding scale insulin  Chronic leukocytosis, underlying history of large granular lymphocytic leukemia - No evidence of infection, CXR unremarkable  LGL leukemia - Outpatient workup per Dr [name] at [facility]   Chronic diarrhea Continue Entocort   Code Status: Full CODE STATUS DVT Prophylaxis:   SCD's Family Communication: Discussed in detail with the patient, all imaging results, lab results explained to the patient   Disposition Plan: Await hematology recommendations  Time Spent in minutes  25 minutes  Procedures:  None  Consultants:   Hematology  Antimicrobials:   None   Medications  Scheduled Meds: . aspirin EC  81 mg Oral Daily  . budesonide  9 mg Oral Daily  . insulin aspart  0-5 Units Subcutaneous QHS  . insulin aspart  0-9 Units Subcutaneous TID WC  . insulin glargine  20 Units Subcutaneous QHS  . sodium chloride flush  3 mL Intravenous Q12H  . sodium chloride flush  3 mL Intravenous Q12H   Continuous Infusions: . sodium chloride    . sodium chloride  PRN Meds:.sodium chloride, acetaminophen **OR** acetaminophen, diazepam, ondansetron **OR** ondansetron (ZOFRAN) IV, sodium chloride flush   Antibiotics   Anti-infectives    None        Subjective:   Tommy Peterson was seen and examined today.  Denies any specific complaints, feeling better after blood transfusion, weakness is improving. No dizziness. Patient denies chest pain, shortness of breath, abdominal pain, N/V/D/C, numbess, tingling. No acute events overnight.    Objective:   Vitals:   12/27/16 0919 12/27/16 1037 12/27/16 1052 12/27/16 1304  BP: 118/60 (!) 134/99 124/64 124/64  Pulse: 93 89 91 80  Resp: 18 18 18 18   Temp: 98.3 F (36.8 C) 98.6 F (37 C) 98.2 F (36.8 C) 98.5 F (36.9 C)  TempSrc: Oral Oral  Oral Oral  SpO2: 96%  99% 97%  Weight:      Height:        Intake/Output Summary (Last 24 hours) at 12/27/16 1549 Last data filed at 12/27/16 1304  Gross per 24 hour  Intake             1847 ml  Output              750 ml  Net             1097 ml     Wt Readings from Last 3 Encounters:  12/26/16 70.1 kg (154 lb 8.7 oz)  12/26/16 69.7 kg (153 lb 9.6 oz)  09/11/16 71.2 kg (157 lb)     Exam  General: Alert and oriented x 3, NAD  HEENT:  PERRLA, EOMI, Anicteric Sclera, mucous membranes moist.   Neck: Supple, no JVD, no masses  Cardiovascular: S1 S2 auscultated, no rubs, murmurs or gallops. Regular rate and rhythm.  Respiratory: Clear to auscultation bilaterally, no wheezing, rales or rhonchi  Gastrointestinal: Soft, nontender, nondistended, + bowel sounds  Ext: no cyanosis clubbing or edema  Neuro: AAOx3, Cr N's II- XII. Strength 5/5 upper and lower extremities bilaterally  Skin: No rashes  Psych: Normal affect and demeanor, alert and oriented x3    Data Reviewed:  I have personally reviewed following labs and imaging studies  Micro Results No results found for this or any previous visit (from the past 240 hour(s)).  Radiology Reports Dg Chest 2 View  Result Date: 12/26/2016 CLINICAL DATA:  76 year old male with cough and abnormal left lower lobe pulmonary auscultation. EXAM: CHEST  2 VIEW COMPARISON:  Chest CTA 09/05/2016 and earlier. FINDINGS: Stable lung volumes. Mediastinal contours remain normal. Calcified aortic atherosclerosis. Visualized tracheal air column is within normal limits. Chronic increased pulmonary interstitial markings with no pneumothorax, pulmonary edema, pleural effusion or confluent pulmonary opacity. Osteopenia. No acute osseous abnormality identified. Negative visible bowel gas pattern. IMPRESSION: No acute cardiopulmonary abnormality. Electronically Signed   By: Genevie Ann M.D.   On: 12/26/2016 10:53    Lab Data:  CBC:  Recent Labs Lab  12/26/16 1116 12/26/16 1308 12/26/16 2253 12/27/16 0535  WBC 15.5* 13.4* 11.4* 13.2*  NEUTROABS  --  7.0  --   --   HGB 5.3* 5.2* 6.8* 7.2*  HCT 15.0* 14.9* 20.4* 20.8*  MCV 119.1* 120.2* 105.2* 105.1*  PLT  --  461* 423* 841*   Basic Metabolic Panel:  Recent Labs Lab 12/26/16 1040 12/26/16 1308 12/27/16 0535  NA 136 134* 135  K 4.4 3.5 4.3  CL 102 106 107  CO2 19 21* 22  GLUCOSE 311* 239* 222*  BUN 15 13 11   CREATININE 0.56*  0.56* 0.54*  CALCIUM 8.8 8.3* 8.3*   GFR: Estimated Creatinine Clearance: 77.9 mL/min (A) (by C-G formula based on SCr of 0.54 mg/dL (L)). Liver Function Tests:  Recent Labs Lab 12/26/16 1040 12/26/16 1308  AST 7 12*  ALT 8 11*  ALKPHOS 85 74  BILITOT 0.7 1.1  PROT 6.0 5.9*  ALBUMIN 3.5 3.2*   No results for input(s): LIPASE, AMYLASE in the last 168 hours. No results for input(s): AMMONIA in the last 168 hours. Coagulation Profile:  Recent Labs Lab 12/26/16 1308  INR 1.22   Cardiac Enzymes:  Recent Labs Lab 12/26/16 2253 12/27/16 0535  TROPONINI <0.03 <0.03   BNP (last 3 results) No results for input(s): PROBNP in the last 8760 hours. HbA1C: No results for input(s): HGBA1C in the last 72 hours. CBG:  Recent Labs Lab 12/26/16 1711 12/26/16 2046 12/27/16 0752 12/27/16 1213  GLUCAP 277* 281* 176* 287*   Lipid Profile: No results for input(s): CHOL, HDL, LDLCALC, TRIG, CHOLHDL, LDLDIRECT in the last 72 hours. Thyroid Function Tests: No results for input(s): TSH, T4TOTAL, FREET4, T3FREE, THYROIDAB in the last 72 hours. Anemia Panel:  Recent Labs  12/27/16 0635 12/27/16 1011  VITAMINB12 625  --   FOLATE  --  18.9  FERRITIN 245  --   TIBC 251  --   IRON 174  --   RETICCTPCT 1.0  --    Urine analysis:    Component Value Date/Time   COLORURINE YELLOW 08/21/2016 1424   APPEARANCEUR HAZY (A) 08/21/2016 1424   LABSPEC 1.028 08/21/2016 1424   PHURINE 5.0 08/21/2016 1424   GLUCOSEU >=500 (A) 08/21/2016 1424    HGBUR NEGATIVE 08/21/2016 1424   BILIRUBINUR negative 09/11/2016 1329   KETONESUR negative 09/11/2016 1329   KETONESUR 5 (A) 08/21/2016 1424   PROTEINUR =100 (A) 09/11/2016 1329   PROTEINUR NEGATIVE 08/21/2016 1424   UROBILINOGEN 0.2 09/11/2016 1329   NITRITE Negative 09/11/2016 1329   NITRITE NEGATIVE 08/21/2016 1424   LEUKOCYTESUR moderate (2+) (A) 09/11/2016 1329     Tommy Peterson M.D. [facility] 12/27/2016, 3:49 PM  Pager: [phone] Between 7am to 7pm - call Pager - [phone]  After 7pm go to [email or web address] - password [email or web address]  Call night coverage person covering after 7pm

## 2016-12-28 LAB — HAPTOGLOBIN: HAPTOGLOBIN: 127 mg/dL (ref 34–200)

## 2016-12-28 LAB — TYPE AND SCREEN
ABO/RH(D): O POS
Antibody Screen: NEGATIVE
UNIT DIVISION: 0
Unit division: 0
Unit division: 0

## 2016-12-28 LAB — BPAM RBC
BLOOD PRODUCT EXPIRATION DATE: 201806182359
BLOOD PRODUCT EXPIRATION DATE: 201806182359
BLOOD PRODUCT EXPIRATION DATE: 201806182359
ISSUE DATE / TIME: 201805221730
ISSUE DATE / TIME: 201805231027
ISSUE DATE / TIME: 201805221419
UNIT TYPE AND RH: 5100
UNIT TYPE AND RH: 5100
Unit Type and Rh: 5100

## 2016-12-28 LAB — HEMOGLOBIN A1C
HEMOGLOBIN A1C: 8.7 % — AB (ref 4.8–5.6)
Mean Plasma Glucose: 203 mg/dL

## 2017-01-02 DIAGNOSIS — D649 Anemia, unspecified: Secondary | ICD-10-CM | POA: Diagnosis not present

## 2017-01-02 DIAGNOSIS — D509 Iron deficiency anemia, unspecified: Secondary | ICD-10-CM | POA: Diagnosis not present

## 2017-01-02 DIAGNOSIS — C91Z0 Other lymphoid leukemia not having achieved remission: Secondary | ICD-10-CM | POA: Diagnosis not present

## 2017-01-02 DIAGNOSIS — D472 Monoclonal gammopathy: Secondary | ICD-10-CM | POA: Diagnosis not present

## 2017-01-04 ENCOUNTER — Telehealth: Payer: Self-pay | Admitting: *Deleted

## 2017-01-04 NOTE — Telephone Encounter (Signed)
Called pt and left message on voice mail requesting a call back to nurse.  Dr. Burr Medico reviewed notes from Dr. [name] @ [facility] on  01/02/17.   Per Dr. Burr Medico, check with pt to see if he needs labs done here locally in between visits at [facility].

## 2017-01-05 DIAGNOSIS — Z794 Long term (current) use of insulin: Secondary | ICD-10-CM | POA: Diagnosis not present

## 2017-01-05 DIAGNOSIS — Z0000 Encounter for general adult medical examination without abnormal findings: Secondary | ICD-10-CM | POA: Diagnosis not present

## 2017-01-05 DIAGNOSIS — I10 Essential (primary) hypertension: Secondary | ICD-10-CM | POA: Diagnosis not present

## 2017-01-05 DIAGNOSIS — C9510 Chronic leukemia of unspecified cell type not having achieved remission: Secondary | ICD-10-CM | POA: Diagnosis not present

## 2017-01-05 DIAGNOSIS — Z1211 Encounter for screening for malignant neoplasm of colon: Secondary | ICD-10-CM | POA: Diagnosis not present

## 2017-01-05 DIAGNOSIS — E119 Type 2 diabetes mellitus without complications: Secondary | ICD-10-CM | POA: Diagnosis not present

## 2017-01-05 DIAGNOSIS — Z09 Encounter for follow-up examination after completed treatment for conditions other than malignant neoplasm: Secondary | ICD-10-CM | POA: Diagnosis not present

## 2017-01-05 DIAGNOSIS — D649 Anemia, unspecified: Secondary | ICD-10-CM | POA: Diagnosis not present

## 2017-01-08 DIAGNOSIS — D649 Anemia, unspecified: Secondary | ICD-10-CM | POA: Diagnosis not present

## 2017-01-08 DIAGNOSIS — Z794 Long term (current) use of insulin: Secondary | ICD-10-CM | POA: Diagnosis not present

## 2017-01-08 DIAGNOSIS — D472 Monoclonal gammopathy: Secondary | ICD-10-CM | POA: Diagnosis not present

## 2017-01-08 DIAGNOSIS — E118 Type 2 diabetes mellitus with unspecified complications: Secondary | ICD-10-CM | POA: Diagnosis not present

## 2017-01-16 DIAGNOSIS — Z87891 Personal history of nicotine dependence: Secondary | ICD-10-CM | POA: Diagnosis not present

## 2017-01-16 DIAGNOSIS — D7589 Other specified diseases of blood and blood-forming organs: Secondary | ICD-10-CM | POA: Diagnosis not present

## 2017-01-16 DIAGNOSIS — R0609 Other forms of dyspnea: Secondary | ICD-10-CM | POA: Diagnosis not present

## 2017-01-16 DIAGNOSIS — C91Z0 Other lymphoid leukemia not having achieved remission: Secondary | ICD-10-CM | POA: Diagnosis not present

## 2017-01-16 DIAGNOSIS — D649 Anemia, unspecified: Secondary | ICD-10-CM | POA: Diagnosis not present

## 2017-01-16 DIAGNOSIS — E119 Type 2 diabetes mellitus without complications: Secondary | ICD-10-CM | POA: Diagnosis not present

## 2017-01-16 DIAGNOSIS — E785 Hyperlipidemia, unspecified: Secondary | ICD-10-CM | POA: Diagnosis not present

## 2017-01-16 DIAGNOSIS — I10 Essential (primary) hypertension: Secondary | ICD-10-CM | POA: Diagnosis not present

## 2017-01-16 DIAGNOSIS — Z794 Long term (current) use of insulin: Secondary | ICD-10-CM | POA: Diagnosis not present

## 2017-01-16 DIAGNOSIS — D539 Nutritional anemia, unspecified: Secondary | ICD-10-CM | POA: Diagnosis not present

## 2017-01-16 DIAGNOSIS — R5383 Other fatigue: Secondary | ICD-10-CM | POA: Diagnosis not present

## 2017-01-16 DIAGNOSIS — D472 Monoclonal gammopathy: Secondary | ICD-10-CM | POA: Diagnosis not present

## 2017-01-16 DIAGNOSIS — D72828 Other elevated white blood cell count: Secondary | ICD-10-CM | POA: Diagnosis not present

## 2017-01-22 DIAGNOSIS — R300 Dysuria: Secondary | ICD-10-CM | POA: Diagnosis not present

## 2017-01-22 DIAGNOSIS — N390 Urinary tract infection, site not specified: Secondary | ICD-10-CM | POA: Diagnosis not present

## 2017-01-31 DIAGNOSIS — R300 Dysuria: Secondary | ICD-10-CM | POA: Diagnosis not present

## 2017-01-31 DIAGNOSIS — I10 Essential (primary) hypertension: Secondary | ICD-10-CM | POA: Diagnosis not present

## 2017-01-31 DIAGNOSIS — Z87438 Personal history of other diseases of male genital organs: Secondary | ICD-10-CM | POA: Diagnosis not present

## 2017-02-02 DIAGNOSIS — E109 Type 1 diabetes mellitus without complications: Secondary | ICD-10-CM | POA: Diagnosis not present

## 2017-02-08 DIAGNOSIS — Z87891 Personal history of nicotine dependence: Secondary | ICD-10-CM | POA: Diagnosis not present

## 2017-02-08 DIAGNOSIS — D472 Monoclonal gammopathy: Secondary | ICD-10-CM | POA: Diagnosis not present

## 2017-02-08 DIAGNOSIS — C91Z0 Other lymphoid leukemia not having achieved remission: Secondary | ICD-10-CM | POA: Diagnosis not present

## 2017-02-08 DIAGNOSIS — Z794 Long term (current) use of insulin: Secondary | ICD-10-CM | POA: Diagnosis not present

## 2017-02-08 DIAGNOSIS — D649 Anemia, unspecified: Secondary | ICD-10-CM | POA: Diagnosis not present

## 2017-02-08 DIAGNOSIS — D72828 Other elevated white blood cell count: Secondary | ICD-10-CM | POA: Diagnosis not present

## 2017-02-08 DIAGNOSIS — D539 Nutritional anemia, unspecified: Secondary | ICD-10-CM | POA: Diagnosis not present

## 2017-02-08 DIAGNOSIS — I10 Essential (primary) hypertension: Secondary | ICD-10-CM | POA: Diagnosis not present

## 2017-02-08 DIAGNOSIS — E785 Hyperlipidemia, unspecified: Secondary | ICD-10-CM | POA: Diagnosis not present

## 2017-02-08 DIAGNOSIS — E119 Type 2 diabetes mellitus without complications: Secondary | ICD-10-CM | POA: Diagnosis not present

## 2017-02-19 DIAGNOSIS — E041 Nontoxic single thyroid nodule: Secondary | ICD-10-CM | POA: Diagnosis not present

## 2017-02-19 DIAGNOSIS — E118 Type 2 diabetes mellitus with unspecified complications: Secondary | ICD-10-CM | POA: Diagnosis not present

## 2017-02-20 DIAGNOSIS — D619 Aplastic anemia, unspecified: Secondary | ICD-10-CM | POA: Diagnosis not present

## 2017-02-20 DIAGNOSIS — Z794 Long term (current) use of insulin: Secondary | ICD-10-CM | POA: Diagnosis not present

## 2017-02-20 DIAGNOSIS — C91Z0 Other lymphoid leukemia not having achieved remission: Secondary | ICD-10-CM | POA: Diagnosis not present

## 2017-02-20 DIAGNOSIS — Z8619 Personal history of other infectious and parasitic diseases: Secondary | ICD-10-CM | POA: Diagnosis not present

## 2017-02-20 DIAGNOSIS — K52831 Collagenous colitis: Secondary | ICD-10-CM | POA: Diagnosis not present

## 2017-02-20 DIAGNOSIS — E119 Type 2 diabetes mellitus without complications: Secondary | ICD-10-CM | POA: Diagnosis not present

## 2017-02-21 DIAGNOSIS — Z794 Long term (current) use of insulin: Secondary | ICD-10-CM | POA: Diagnosis not present

## 2017-02-21 DIAGNOSIS — D649 Anemia, unspecified: Secondary | ICD-10-CM | POA: Diagnosis not present

## 2017-02-21 DIAGNOSIS — C91Z0 Other lymphoid leukemia not having achieved remission: Secondary | ICD-10-CM | POA: Diagnosis not present

## 2017-02-21 DIAGNOSIS — E118 Type 2 diabetes mellitus with unspecified complications: Secondary | ICD-10-CM | POA: Diagnosis not present

## 2017-02-22 DIAGNOSIS — Z794 Long term (current) use of insulin: Secondary | ICD-10-CM | POA: Diagnosis not present

## 2017-02-22 DIAGNOSIS — I10 Essential (primary) hypertension: Secondary | ICD-10-CM | POA: Diagnosis not present

## 2017-02-22 DIAGNOSIS — M1990 Unspecified osteoarthritis, unspecified site: Secondary | ICD-10-CM | POA: Diagnosis not present

## 2017-02-22 DIAGNOSIS — D539 Nutritional anemia, unspecified: Secondary | ICD-10-CM | POA: Diagnosis not present

## 2017-02-22 DIAGNOSIS — E785 Hyperlipidemia, unspecified: Secondary | ICD-10-CM | POA: Diagnosis not present

## 2017-02-22 DIAGNOSIS — D649 Anemia, unspecified: Secondary | ICD-10-CM | POA: Diagnosis not present

## 2017-02-22 DIAGNOSIS — Z87891 Personal history of nicotine dependence: Secondary | ICD-10-CM | POA: Diagnosis not present

## 2017-02-22 DIAGNOSIS — D72828 Other elevated white blood cell count: Secondary | ICD-10-CM | POA: Diagnosis not present

## 2017-02-22 DIAGNOSIS — R5383 Other fatigue: Secondary | ICD-10-CM | POA: Diagnosis not present

## 2017-02-22 DIAGNOSIS — E119 Type 2 diabetes mellitus without complications: Secondary | ICD-10-CM | POA: Diagnosis not present

## 2017-02-22 DIAGNOSIS — D472 Monoclonal gammopathy: Secondary | ICD-10-CM | POA: Diagnosis not present

## 2017-02-22 DIAGNOSIS — C91Z0 Other lymphoid leukemia not having achieved remission: Secondary | ICD-10-CM | POA: Diagnosis not present

## 2017-03-07 DIAGNOSIS — Z4789 Encounter for other orthopedic aftercare: Secondary | ICD-10-CM | POA: Diagnosis not present

## 2017-03-08 DIAGNOSIS — D472 Monoclonal gammopathy: Secondary | ICD-10-CM | POA: Diagnosis not present

## 2017-03-08 DIAGNOSIS — Z9889 Other specified postprocedural states: Secondary | ICD-10-CM | POA: Diagnosis not present

## 2017-03-08 DIAGNOSIS — C91Z0 Other lymphoid leukemia not having achieved remission: Secondary | ICD-10-CM | POA: Diagnosis not present

## 2017-03-08 DIAGNOSIS — C4492 Squamous cell carcinoma of skin, unspecified: Secondary | ICD-10-CM | POA: Diagnosis not present

## 2017-03-08 DIAGNOSIS — Z87891 Personal history of nicotine dependence: Secondary | ICD-10-CM | POA: Diagnosis not present

## 2017-03-08 DIAGNOSIS — D649 Anemia, unspecified: Secondary | ICD-10-CM | POA: Diagnosis not present

## 2017-03-08 DIAGNOSIS — E785 Hyperlipidemia, unspecified: Secondary | ICD-10-CM | POA: Diagnosis not present

## 2017-03-08 DIAGNOSIS — E119 Type 2 diabetes mellitus without complications: Secondary | ICD-10-CM | POA: Diagnosis not present

## 2017-03-08 DIAGNOSIS — I10 Essential (primary) hypertension: Secondary | ICD-10-CM | POA: Diagnosis not present

## 2017-03-20 DIAGNOSIS — M25512 Pain in left shoulder: Secondary | ICD-10-CM | POA: Diagnosis not present

## 2017-03-27 DIAGNOSIS — E118 Type 2 diabetes mellitus with unspecified complications: Secondary | ICD-10-CM | POA: Diagnosis not present

## 2017-03-28 DIAGNOSIS — M75121 Complete rotator cuff tear or rupture of right shoulder, not specified as traumatic: Secondary | ICD-10-CM | POA: Diagnosis not present

## 2017-03-29 DIAGNOSIS — C91Z0 Other lymphoid leukemia not having achieved remission: Secondary | ICD-10-CM | POA: Diagnosis not present

## 2017-03-29 DIAGNOSIS — D649 Anemia, unspecified: Secondary | ICD-10-CM | POA: Diagnosis not present

## 2017-03-29 DIAGNOSIS — Z87891 Personal history of nicotine dependence: Secondary | ICD-10-CM | POA: Diagnosis not present

## 2017-03-29 DIAGNOSIS — D472 Monoclonal gammopathy: Secondary | ICD-10-CM | POA: Diagnosis not present

## 2017-03-29 DIAGNOSIS — D72820 Lymphocytosis (symptomatic): Secondary | ICD-10-CM | POA: Diagnosis not present

## 2017-03-29 DIAGNOSIS — E785 Hyperlipidemia, unspecified: Secondary | ICD-10-CM | POA: Diagnosis not present

## 2017-03-29 DIAGNOSIS — E119 Type 2 diabetes mellitus without complications: Secondary | ICD-10-CM | POA: Diagnosis not present

## 2017-03-29 DIAGNOSIS — M1990 Unspecified osteoarthritis, unspecified site: Secondary | ICD-10-CM | POA: Diagnosis not present

## 2017-03-29 DIAGNOSIS — D7589 Other specified diseases of blood and blood-forming organs: Secondary | ICD-10-CM | POA: Diagnosis not present

## 2017-03-29 DIAGNOSIS — D126 Benign neoplasm of colon, unspecified: Secondary | ICD-10-CM | POA: Diagnosis not present

## 2017-03-29 DIAGNOSIS — D539 Nutritional anemia, unspecified: Secondary | ICD-10-CM | POA: Diagnosis not present

## 2017-03-29 DIAGNOSIS — I10 Essential (primary) hypertension: Secondary | ICD-10-CM | POA: Diagnosis not present

## 2017-04-03 DIAGNOSIS — R350 Frequency of micturition: Secondary | ICD-10-CM | POA: Diagnosis not present

## 2017-04-03 DIAGNOSIS — I10 Essential (primary) hypertension: Secondary | ICD-10-CM | POA: Diagnosis not present

## 2017-04-03 DIAGNOSIS — R300 Dysuria: Secondary | ICD-10-CM | POA: Diagnosis not present

## 2017-04-19 DIAGNOSIS — D649 Anemia, unspecified: Secondary | ICD-10-CM | POA: Diagnosis not present

## 2017-04-19 DIAGNOSIS — C91Z0 Other lymphoid leukemia not having achieved remission: Secondary | ICD-10-CM | POA: Diagnosis not present

## 2017-04-19 DIAGNOSIS — M1990 Unspecified osteoarthritis, unspecified site: Secondary | ICD-10-CM | POA: Diagnosis not present

## 2017-04-19 DIAGNOSIS — D472 Monoclonal gammopathy: Secondary | ICD-10-CM | POA: Diagnosis not present

## 2017-04-19 DIAGNOSIS — Z87891 Personal history of nicotine dependence: Secondary | ICD-10-CM | POA: Diagnosis not present

## 2017-04-19 DIAGNOSIS — D539 Nutritional anemia, unspecified: Secondary | ICD-10-CM | POA: Diagnosis not present

## 2017-04-19 DIAGNOSIS — E785 Hyperlipidemia, unspecified: Secondary | ICD-10-CM | POA: Diagnosis not present

## 2017-04-19 DIAGNOSIS — E119 Type 2 diabetes mellitus without complications: Secondary | ICD-10-CM | POA: Diagnosis not present

## 2017-04-19 DIAGNOSIS — Z794 Long term (current) use of insulin: Secondary | ICD-10-CM | POA: Diagnosis not present

## 2017-04-19 DIAGNOSIS — I10 Essential (primary) hypertension: Secondary | ICD-10-CM | POA: Diagnosis not present

## 2017-04-19 DIAGNOSIS — D126 Benign neoplasm of colon, unspecified: Secondary | ICD-10-CM | POA: Diagnosis not present

## 2017-04-20 DIAGNOSIS — H43813 Vitreous degeneration, bilateral: Secondary | ICD-10-CM | POA: Diagnosis not present

## 2017-04-20 DIAGNOSIS — C91Z0 Other lymphoid leukemia not having achieved remission: Secondary | ICD-10-CM | POA: Diagnosis not present

## 2017-04-20 DIAGNOSIS — E118 Type 2 diabetes mellitus with unspecified complications: Secondary | ICD-10-CM | POA: Diagnosis not present

## 2017-04-20 DIAGNOSIS — Z961 Presence of intraocular lens: Secondary | ICD-10-CM | POA: Diagnosis not present

## 2017-04-20 DIAGNOSIS — H26493 Other secondary cataract, bilateral: Secondary | ICD-10-CM | POA: Diagnosis not present

## 2017-04-20 DIAGNOSIS — D472 Monoclonal gammopathy: Secondary | ICD-10-CM | POA: Diagnosis not present

## 2017-04-20 DIAGNOSIS — D649 Anemia, unspecified: Secondary | ICD-10-CM | POA: Diagnosis not present

## 2017-04-20 DIAGNOSIS — E113293 Type 2 diabetes mellitus with mild nonproliferative diabetic retinopathy without macular edema, bilateral: Secondary | ICD-10-CM | POA: Diagnosis not present

## 2017-05-02 DIAGNOSIS — S299XXA Unspecified injury of thorax, initial encounter: Secondary | ICD-10-CM | POA: Diagnosis not present

## 2017-05-02 DIAGNOSIS — R079 Chest pain, unspecified: Secondary | ICD-10-CM | POA: Diagnosis not present

## 2017-05-02 DIAGNOSIS — M546 Pain in thoracic spine: Secondary | ICD-10-CM | POA: Diagnosis not present

## 2017-05-03 DIAGNOSIS — E785 Hyperlipidemia, unspecified: Secondary | ICD-10-CM | POA: Diagnosis not present

## 2017-05-03 DIAGNOSIS — D472 Monoclonal gammopathy: Secondary | ICD-10-CM | POA: Diagnosis not present

## 2017-05-03 DIAGNOSIS — D609 Acquired pure red cell aplasia, unspecified: Secondary | ICD-10-CM | POA: Diagnosis not present

## 2017-05-03 DIAGNOSIS — E119 Type 2 diabetes mellitus without complications: Secondary | ICD-10-CM | POA: Diagnosis not present

## 2017-05-03 DIAGNOSIS — Z87891 Personal history of nicotine dependence: Secondary | ICD-10-CM | POA: Diagnosis not present

## 2017-05-03 DIAGNOSIS — D649 Anemia, unspecified: Secondary | ICD-10-CM | POA: Diagnosis not present

## 2017-05-03 DIAGNOSIS — R5383 Other fatigue: Secondary | ICD-10-CM | POA: Diagnosis not present

## 2017-05-03 DIAGNOSIS — C91Z0 Other lymphoid leukemia not having achieved remission: Secondary | ICD-10-CM | POA: Diagnosis not present

## 2017-05-03 DIAGNOSIS — D539 Nutritional anemia, unspecified: Secondary | ICD-10-CM | POA: Diagnosis not present

## 2017-05-03 DIAGNOSIS — C9110 Chronic lymphocytic leukemia of B-cell type not having achieved remission: Secondary | ICD-10-CM | POA: Diagnosis not present

## 2017-05-03 DIAGNOSIS — I10 Essential (primary) hypertension: Secondary | ICD-10-CM | POA: Diagnosis not present

## 2017-05-07 ENCOUNTER — Ambulatory Visit: Payer: Medicare HMO | Admitting: Physician Assistant

## 2017-05-07 DIAGNOSIS — R05 Cough: Secondary | ICD-10-CM | POA: Diagnosis not present

## 2017-05-12 DIAGNOSIS — R69 Illness, unspecified: Secondary | ICD-10-CM | POA: Diagnosis not present

## 2017-05-14 DIAGNOSIS — R69 Illness, unspecified: Secondary | ICD-10-CM | POA: Diagnosis not present

## 2017-05-17 DIAGNOSIS — C9110 Chronic lymphocytic leukemia of B-cell type not having achieved remission: Secondary | ICD-10-CM | POA: Diagnosis not present

## 2017-05-17 DIAGNOSIS — C91Z0 Other lymphoid leukemia not having achieved remission: Secondary | ICD-10-CM | POA: Diagnosis not present

## 2017-05-17 DIAGNOSIS — D472 Monoclonal gammopathy: Secondary | ICD-10-CM | POA: Diagnosis not present

## 2017-05-17 DIAGNOSIS — D649 Anemia, unspecified: Secondary | ICD-10-CM | POA: Diagnosis not present

## 2017-05-29 DIAGNOSIS — I10 Essential (primary) hypertension: Secondary | ICD-10-CM | POA: Diagnosis not present

## 2017-05-29 DIAGNOSIS — M1990 Unspecified osteoarthritis, unspecified site: Secondary | ICD-10-CM | POA: Diagnosis not present

## 2017-05-29 DIAGNOSIS — E785 Hyperlipidemia, unspecified: Secondary | ICD-10-CM | POA: Diagnosis not present

## 2017-05-29 DIAGNOSIS — D72828 Other elevated white blood cell count: Secondary | ICD-10-CM | POA: Diagnosis not present

## 2017-05-29 DIAGNOSIS — D649 Anemia, unspecified: Secondary | ICD-10-CM | POA: Diagnosis not present

## 2017-05-29 DIAGNOSIS — D472 Monoclonal gammopathy: Secondary | ICD-10-CM | POA: Diagnosis not present

## 2017-05-29 DIAGNOSIS — D609 Acquired pure red cell aplasia, unspecified: Secondary | ICD-10-CM | POA: Diagnosis not present

## 2017-05-29 DIAGNOSIS — D7589 Other specified diseases of blood and blood-forming organs: Secondary | ICD-10-CM | POA: Diagnosis not present

## 2017-05-29 DIAGNOSIS — D72820 Lymphocytosis (symptomatic): Secondary | ICD-10-CM | POA: Diagnosis not present

## 2017-05-29 DIAGNOSIS — D539 Nutritional anemia, unspecified: Secondary | ICD-10-CM | POA: Diagnosis not present

## 2017-05-29 DIAGNOSIS — C91Z0 Other lymphoid leukemia not having achieved remission: Secondary | ICD-10-CM | POA: Diagnosis not present

## 2017-05-29 DIAGNOSIS — R0609 Other forms of dyspnea: Secondary | ICD-10-CM | POA: Diagnosis not present

## 2017-05-29 DIAGNOSIS — E119 Type 2 diabetes mellitus without complications: Secondary | ICD-10-CM | POA: Diagnosis not present

## 2017-05-29 DIAGNOSIS — J069 Acute upper respiratory infection, unspecified: Secondary | ICD-10-CM | POA: Diagnosis not present

## 2017-06-13 DIAGNOSIS — D649 Anemia, unspecified: Secondary | ICD-10-CM | POA: Diagnosis not present

## 2017-06-13 DIAGNOSIS — C9110 Chronic lymphocytic leukemia of B-cell type not having achieved remission: Secondary | ICD-10-CM | POA: Diagnosis not present

## 2017-06-21 DIAGNOSIS — C9110 Chronic lymphocytic leukemia of B-cell type not having achieved remission: Secondary | ICD-10-CM | POA: Diagnosis not present

## 2017-06-21 DIAGNOSIS — D472 Monoclonal gammopathy: Secondary | ICD-10-CM | POA: Diagnosis not present

## 2017-06-21 DIAGNOSIS — D649 Anemia, unspecified: Secondary | ICD-10-CM | POA: Diagnosis not present

## 2017-06-21 DIAGNOSIS — D7589 Other specified diseases of blood and blood-forming organs: Secondary | ICD-10-CM | POA: Diagnosis not present

## 2017-07-02 DIAGNOSIS — R69 Illness, unspecified: Secondary | ICD-10-CM | POA: Diagnosis not present

## 2017-07-05 DIAGNOSIS — C9110 Chronic lymphocytic leukemia of B-cell type not having achieved remission: Secondary | ICD-10-CM | POA: Diagnosis not present

## 2017-07-05 DIAGNOSIS — D649 Anemia, unspecified: Secondary | ICD-10-CM | POA: Diagnosis not present

## 2017-07-05 DIAGNOSIS — D472 Monoclonal gammopathy: Secondary | ICD-10-CM | POA: Diagnosis not present

## 2017-07-19 DIAGNOSIS — E785 Hyperlipidemia, unspecified: Secondary | ICD-10-CM | POA: Diagnosis not present

## 2017-07-19 DIAGNOSIS — D649 Anemia, unspecified: Secondary | ICD-10-CM | POA: Diagnosis not present

## 2017-07-19 DIAGNOSIS — D472 Monoclonal gammopathy: Secondary | ICD-10-CM | POA: Diagnosis not present

## 2017-07-19 DIAGNOSIS — Z87891 Personal history of nicotine dependence: Secondary | ICD-10-CM | POA: Diagnosis not present

## 2017-07-19 DIAGNOSIS — D609 Acquired pure red cell aplasia, unspecified: Secondary | ICD-10-CM | POA: Diagnosis not present

## 2017-07-19 DIAGNOSIS — E119 Type 2 diabetes mellitus without complications: Secondary | ICD-10-CM | POA: Diagnosis not present

## 2017-07-19 DIAGNOSIS — C91Z0 Other lymphoid leukemia not having achieved remission: Secondary | ICD-10-CM | POA: Diagnosis not present

## 2017-07-19 DIAGNOSIS — D539 Nutritional anemia, unspecified: Secondary | ICD-10-CM | POA: Diagnosis not present

## 2017-07-19 DIAGNOSIS — C9110 Chronic lymphocytic leukemia of B-cell type not having achieved remission: Secondary | ICD-10-CM | POA: Diagnosis not present

## 2017-07-19 DIAGNOSIS — I10 Essential (primary) hypertension: Secondary | ICD-10-CM | POA: Diagnosis not present

## 2017-07-19 DIAGNOSIS — M1990 Unspecified osteoarthritis, unspecified site: Secondary | ICD-10-CM | POA: Diagnosis not present

## 2017-08-16 DIAGNOSIS — C9110 Chronic lymphocytic leukemia of B-cell type not having achieved remission: Secondary | ICD-10-CM | POA: Diagnosis not present

## 2017-08-16 DIAGNOSIS — D472 Monoclonal gammopathy: Secondary | ICD-10-CM | POA: Diagnosis not present

## 2017-08-16 DIAGNOSIS — D72820 Lymphocytosis (symptomatic): Secondary | ICD-10-CM | POA: Diagnosis not present

## 2017-08-16 DIAGNOSIS — D7589 Other specified diseases of blood and blood-forming organs: Secondary | ICD-10-CM | POA: Diagnosis not present

## 2017-08-16 DIAGNOSIS — D649 Anemia, unspecified: Secondary | ICD-10-CM | POA: Diagnosis not present

## 2017-09-13 DIAGNOSIS — D649 Anemia, unspecified: Secondary | ICD-10-CM | POA: Diagnosis not present

## 2017-09-13 DIAGNOSIS — D472 Monoclonal gammopathy: Secondary | ICD-10-CM | POA: Diagnosis not present

## 2017-09-13 DIAGNOSIS — D7589 Other specified diseases of blood and blood-forming organs: Secondary | ICD-10-CM | POA: Diagnosis not present

## 2017-09-13 DIAGNOSIS — C9110 Chronic lymphocytic leukemia of B-cell type not having achieved remission: Secondary | ICD-10-CM | POA: Diagnosis not present

## 2017-09-26 ENCOUNTER — Other Ambulatory Visit: Payer: Self-pay

## 2017-09-26 ENCOUNTER — Ambulatory Visit (INDEPENDENT_AMBULATORY_CARE_PROVIDER_SITE_OTHER): Payer: Medicare HMO | Admitting: Physician Assistant

## 2017-09-26 ENCOUNTER — Encounter: Payer: Self-pay | Admitting: Physician Assistant

## 2017-09-26 VITALS — BP 122/68 | HR 92 | Temp 98.3°F | Resp 16 | Ht 69.0 in | Wt 167.8 lb

## 2017-09-26 DIAGNOSIS — R300 Dysuria: Secondary | ICD-10-CM | POA: Diagnosis not present

## 2017-09-26 DIAGNOSIS — R3915 Urgency of urination: Secondary | ICD-10-CM

## 2017-09-26 DIAGNOSIS — R350 Frequency of micturition: Secondary | ICD-10-CM | POA: Diagnosis not present

## 2017-09-26 LAB — POCT URINALYSIS DIP (MANUAL ENTRY)
BILIRUBIN UA: NEGATIVE
BILIRUBIN UA: NEGATIVE mg/dL
Blood, UA: NEGATIVE
Glucose, UA: 500 mg/dL — AB
LEUKOCYTES UA: NEGATIVE
Nitrite, UA: NEGATIVE
PH UA: 7 (ref 5.0–8.0)
Protein Ur, POC: NEGATIVE mg/dL
SPEC GRAV UA: 1.015 (ref 1.010–1.025)
Urobilinogen, UA: 0.2 E.U./dL

## 2017-09-26 MED ORDER — CIPROFLOXACIN HCL 500 MG PO TABS: 500.0000 mg | ORAL_TABLET | Freq: Two times a day (BID) | ORAL | 0 refills | Status: DC

## 2017-09-26 NOTE — Patient Instructions (Addendum)
Dysuria Dysuria is pain or discomfort while urinating. The pain or discomfort may be felt in the tube that carries urine out of the bladder (urethra) or in the surrounding tissue of the genitals. The pain may also be felt in the groin area, lower abdomen, and lower back. You may have to urinate frequently or have the sudden feeling that you have to urinate (urgency). Dysuria can affect both men and women, but is more common in women. Dysuria can be caused by many different things, including:  Urinary tract infection in women.  Infection of the kidney or bladder.  Kidney stones or bladder stones.  Certain sexually transmitted infections (STIs), such as chlamydia.  Dehydration.  Inflammation of the vagina.  Use of certain medicines.  Use of certain soaps or scented products that cause irritation.  Follow these instructions at home: Watch your dysuria for any changes. The following actions may help to reduce any discomfort you are feeling:  Drink enough fluid to keep your urine clear or pale yellow.  Empty your bladder often. Avoid holding urine for long periods of time.  After a bowel movement or urination, women should cleanse from front to back, using each tissue only once.  Empty your bladder after sexual intercourse.  Take medicines only as directed by your health care provider.  If you were prescribed an antibiotic medicine, finish it all even if you start to feel better.  Avoid caffeine, tea, and alcohol. They can irritate the bladder and make dysuria worse. In men, alcohol may irritate the prostate.  Keep all follow-up visits as directed by your health care provider. This is important.  If you had any tests done to find the cause of dysuria, it is your responsibility to obtain your test results. Ask the lab or department performing the test when and how you will get your results. Talk with your health care provider if you have any questions about your results.  Contact a  health care provider if:  You develop pain in your back or sides.  You have a fever.  You have nausea or vomiting.  You have blood in your urine.  You are not urinating as often as you usually do. Get help right away if:  You pain is severe and not relieved with medicines.  You are unable to hold down any fluids.  You or someone else notices a change in your mental function.  You have a rapid heartbeat at rest.  You have shaking or chills.  You feel extremely weak. This information is not intended to replace advice given to you by your health care provider. Make sure you discuss any questions you have with your health care provider. Document Released: 04/21/2004 Document Revised: 12/30/2015 Document Reviewed: 03/19/2014 Elsevier Interactive Patient Education  2018 [employer]     IF you received an x-ray today, you will receive an invoice from [facility]. Please contact [facility] at [phone] with questions or concerns regarding your invoice.   IF you received labwork today, you will receive an invoice from [facility]. Please contact [facility] at [phone] with questions or concerns regarding your invoice.   Our billing staff will not be able to assist you with questions regarding bills from these companies.  You will be contacted with the lab results as soon as they are available. The fastest way to get your results is to activate your My Chart account. Instructions are located on the last page of this paperwork. If you have not heard from   [place] regarding the results in 2 weeks, please contact this office.

## 2017-09-26 NOTE — Progress Notes (Signed)
09/26/2017 4:16 PM   DOB: 11/12/1940 / MRN: [id number]  SUBJECTIVE:  Tommy Peterson is a 77 y.o. male presenting for dysuria, urgency, frequency.  He has a history of prostate instrumentation.  He feels he is getting worse.  He is moving his bowels normally.  He denies fever, chills, nausea.  He has had a UTI in the past.  He is allergic to bee venom; metformin and related; and sulfamethoxazole-trimethoprim.   He  has a past medical history of Anemia, Cataract, Diabetes mellitus without complication ([place]), Macrocytosis, and Monoclonal gammopathy.    He  reports that he quit smoking about 21 years ago. His smoking use included cigarettes. he has never used smokeless tobacco. He reports that he drinks alcohol. He reports that he does not use drugs. He  reports that he does not engage in sexual activity. The patient  has a past surgical history that includes Hernia repair; Prostate surgery; Cataract extraction; Tonsillectomy; Penile prosthesis implant; and Bone marrow biopsy (Right, 2016).  His family history includes Diabetes in his mother; Heart failure in his mother.  ROS  The problem list and medications were reviewed and updated by myself where necessary and exist elsewhere in the encounter.   OBJECTIVE:  BP 122/68   Pulse 92   Temp 98.3 F (36.8 C)   Resp 16   Ht '5\' 9"'$  (1.753 m)   Wt 167 lb 12.8 oz (76.1 kg)   SpO2 96%   BMI 24.78 kg/m   Lab Results  Component Value Date   CREATININE 0.54 (L) 12/27/2016   BUN 11 12/27/2016   NA 135 12/27/2016   K 4.3 12/27/2016   CL 107 12/27/2016   CO2 22 12/27/2016     Physical Exam Well-appearing and nontoxic.  Vitals reviewed and normal.  Abdominal exam significant for suprapubic tenderness.  He has normal bowel sounds and is negative for tenderness elsewhere.  Abdomen negative for bruising and swelling.  Heart with normal rate and rhythm.  Lungs clear to auscultation bilaterally.   Results for orders placed or performed in  visit on 09/26/17 (from the past 72 hour(s))  POCT urinalysis dipstick     Status: Abnormal   Collection Time: 09/26/17  4:13 PM  Result Value Ref Range   Color, UA yellow yellow   Clarity, UA clear clear   Glucose, UA =500 (A) negative mg/dL   Bilirubin, UA negative negative   Ketones, POC UA negative negative mg/dL   Spec Grav, UA 1.015 1.010 - 1.025   Blood, UA negative negative   pH, UA 7.0 5.0 - 8.0   Protein Ur, POC negative negative mg/dL   Urobilinogen, UA 0.2 0.2 or 1.0 E.U./dL   Nitrite, UA Negative Negative   Leukocytes, UA Negative Negative    No results found.  ASSESSMENT AND PLAN:  Kaulder was seen today for burning with urination.  Diagnoses and all orders for this visit:  Dysuria -     POCT urinalysis dipstick -     POCT Microscopic Urinalysis ([facility]) -     PSA  Other orders -     ciprofloxacin (CIPRO) 500 MG tablet; Take 1 tablet (500 mg total) by mouth 2 (two) times daily.    The patient is advised to call or return to clinic if he does not see an improvement in symptoms, or to seek the care of the closest emergency department if he worsens with the above plan.   Philis Fendt, MHS, PA-C Primary Care at [facility]  [facility] 09/26/2017 4:16 PM

## 2017-09-27 LAB — PSA: Prostate Specific Ag, Serum: 1.2 ng/mL (ref 0.0–4.0)

## 2017-09-28 LAB — URINE CULTURE

## 2017-09-28 NOTE — Progress Notes (Signed)
FYI I spoke with the patient.  He is having less dysuria.  He still complaining of urinary frequency however is not checking his sugar.  I advised that he continue the course of Cipro as I cannot fully be sure this is not his prostate.  Advised that he is more than welcome to come back to talk about his diabetes if he would like as this may also be a cause of his frequency of urination.  He says he wants to give it a few more days before coming back.

## 2017-10-16 DIAGNOSIS — E119 Type 2 diabetes mellitus without complications: Secondary | ICD-10-CM | POA: Diagnosis not present

## 2017-10-16 DIAGNOSIS — K5641 Fecal impaction: Secondary | ICD-10-CM | POA: Diagnosis not present

## 2017-10-16 DIAGNOSIS — Z8619 Personal history of other infectious and parasitic diseases: Secondary | ICD-10-CM | POA: Diagnosis not present

## 2017-10-16 DIAGNOSIS — K52831 Collagenous colitis: Secondary | ICD-10-CM | POA: Diagnosis not present

## 2017-10-16 DIAGNOSIS — D619 Aplastic anemia, unspecified: Secondary | ICD-10-CM | POA: Diagnosis not present

## 2017-10-16 DIAGNOSIS — K5900 Constipation, unspecified: Secondary | ICD-10-CM | POA: Diagnosis not present

## 2017-10-16 DIAGNOSIS — C91Z0 Other lymphoid leukemia not having achieved remission: Secondary | ICD-10-CM | POA: Diagnosis not present

## 2017-10-18 DIAGNOSIS — R350 Frequency of micturition: Secondary | ICD-10-CM | POA: Diagnosis not present

## 2017-10-18 DIAGNOSIS — E118 Type 2 diabetes mellitus with unspecified complications: Secondary | ICD-10-CM | POA: Diagnosis not present

## 2017-10-18 DIAGNOSIS — D649 Anemia, unspecified: Secondary | ICD-10-CM | POA: Diagnosis not present

## 2017-10-18 DIAGNOSIS — Z794 Long term (current) use of insulin: Secondary | ICD-10-CM | POA: Diagnosis not present

## 2017-10-18 DIAGNOSIS — Z125 Encounter for screening for malignant neoplasm of prostate: Secondary | ICD-10-CM | POA: Diagnosis not present

## 2017-10-22 DIAGNOSIS — R3915 Urgency of urination: Secondary | ICD-10-CM | POA: Diagnosis not present

## 2017-10-22 DIAGNOSIS — Z87438 Personal history of other diseases of male genital organs: Secondary | ICD-10-CM | POA: Diagnosis not present

## 2017-10-22 DIAGNOSIS — I10 Essential (primary) hypertension: Secondary | ICD-10-CM | POA: Diagnosis not present

## 2017-10-22 DIAGNOSIS — N390 Urinary tract infection, site not specified: Secondary | ICD-10-CM | POA: Diagnosis not present

## 2017-10-25 DIAGNOSIS — D7589 Other specified diseases of blood and blood-forming organs: Secondary | ICD-10-CM | POA: Diagnosis not present

## 2017-10-25 DIAGNOSIS — D472 Monoclonal gammopathy: Secondary | ICD-10-CM | POA: Diagnosis not present

## 2017-10-25 DIAGNOSIS — C9110 Chronic lymphocytic leukemia of B-cell type not having achieved remission: Secondary | ICD-10-CM | POA: Diagnosis not present

## 2017-10-25 DIAGNOSIS — D649 Anemia, unspecified: Secondary | ICD-10-CM | POA: Diagnosis not present

## 2017-10-29 ENCOUNTER — Other Ambulatory Visit: Payer: Self-pay

## 2017-10-29 ENCOUNTER — Encounter: Payer: Self-pay | Admitting: Family Medicine

## 2017-10-29 ENCOUNTER — Ambulatory Visit (INDEPENDENT_AMBULATORY_CARE_PROVIDER_SITE_OTHER): Payer: Medicare HMO | Admitting: Family Medicine

## 2017-10-29 VITALS — BP 128/60 | HR 98 | Temp 98.6°F | Resp 18 | Ht 69.0 in | Wt 170.4 lb

## 2017-10-29 DIAGNOSIS — R350 Frequency of micturition: Secondary | ICD-10-CM | POA: Diagnosis not present

## 2017-10-29 DIAGNOSIS — R739 Hyperglycemia, unspecified: Secondary | ICD-10-CM

## 2017-10-29 DIAGNOSIS — R300 Dysuria: Secondary | ICD-10-CM | POA: Diagnosis not present

## 2017-10-29 DIAGNOSIS — N419 Inflammatory disease of prostate, unspecified: Secondary | ICD-10-CM | POA: Diagnosis not present

## 2017-10-29 DIAGNOSIS — R3911 Hesitancy of micturition: Secondary | ICD-10-CM | POA: Diagnosis not present

## 2017-10-29 LAB — POCT URINALYSIS DIP (MANUAL ENTRY)
BILIRUBIN UA: NEGATIVE mg/dL
Bilirubin, UA: NEGATIVE
Glucose, UA: 500 mg/dL — AB
Nitrite, UA: NEGATIVE
PROTEIN UA: NEGATIVE mg/dL
Spec Grav, UA: 1.015 (ref 1.010–1.025)
Urobilinogen, UA: 0.2 E.U./dL
pH, UA: 6 (ref 5.0–8.0)

## 2017-10-29 LAB — POC MICROSCOPIC URINALYSIS (UMFC): Mucus: ABSENT

## 2017-10-29 MED ORDER — LEVOFLOXACIN 500 MG PO TABS: 500.0000 mg | ORAL_TABLET | Freq: Every day | ORAL | 0 refills | Status: DC

## 2017-10-29 NOTE — Progress Notes (Signed)
Subjective:  By signing my name below, I, Moises Blood, attest that this documentation has been prepared under the direction and in the presence of [name], MD. Electronically Signed: Moises Blood, [place]. 10/29/2017 , 3:38 PM .  Patient was seen in Room 12 .   Patient ID: Tommy Peterson, male    DOB: 1941/07/22, 77 y.o.   MRN: [id number] Chief Complaint  Patient presents with  . Urinary Frequency    having a weak flow came here feb. 20th and isnt getting better   . Dysuria   HPI Tommy Peterson is a 77 y.o. male Here for urinary frequency and dysuria. 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   Urinary symptoms When patient was seen on Feb 20th by Philis Fendt, he was complaining of dysuria with urinary urgency and frequency. He had urine glucose of 500, no nitrate or LE. He was started on Cipro. PSA was normal at 1.2. Instructed to follow up if persistent symptoms. His urine culture did have E.coli, but 10,000-25,000 cfu that was pansensitive.   He was seen on March 18th by his urologist, Dr. Ky Barban, at [facility], reported significant frequency at that time, occasional pain with weak stream and post void dribble. He was taking Flomax, and DRE was declined. PVR 14cc. He was continued on Flomax, trial of oxybutynin '5mg'$  TID and recommended diabetic control. Urine culture with urology was greater than 100,000 of enterococcus.   Patient states he initially received 10 day course of Cipro, without any improvement of his symptoms. And then, he followed up with his urologist with appointment on March 18th. He went with continued symptoms. He was prescribed another 12-day course of Cipro; however, he found out there's an interaction with Cipro while on methotrexate (for his leukemia). Patient reports still urinating about 30-40 times a day. He wasn't aware of this issue when seen by his urologist. He has another  appointment with urologist in about a month. He hasn't taken new supply of Cipro but has been taking oxybutynin. His next appointment with his hematologist is in a few days (either on 28th or 29th). Last prostate procedure was done 10 years ago. He denies PSA done at urologist's office visit. He denies fever.   Epocrates shows interaction of amoxicillin and Cipro with methotrexate, but no interaction with Levaquin and methotrexate.   Patient informs only being able to urinate when he sits down and leans on his left side. When he starts to walk around, he has urgency to urinate. By the time he gets to the commode, he can only urinate a few drips and can't void fully. He declines DRE today due to pain; would like to defer testing unless under sedation.   DM His A1C was 8.3 on on March 21st with glucose of 238 at that time, when seen by his hematologist.   Patient states his blood sugar has been running high. He was switched off of Lantus to [place]. His sugars have been consistently much higher, around low of 125 to high of 254 this morning; average 180s-190s.   Patient Active Problem List   Diagnosis Date Noted  . Leukemia not having achieved remission ([place]) 12/26/2016  . Anemia 12/26/2016  . Large granular lymphocytic leukemia ([place]) 12/26/2016  . Diabetes mellitus with complication ([place])   . Symptomatic anemia   . DOE (dyspnea on exertion)   . Anxiety   . History of GI bleed   . C.  difficile colitis 05/31/2016  . Lactic acidosis   . History of bone marrow biopsy 05/27/2016  . Fever 05/27/2016  . Encephalopathy in sepsis 05/27/2016  . Sepsis ([place]) 05/27/2016  . Chest pain 05/04/2016  . Benign fibroma of prostate 09/20/2015  . HLD (hyperlipidemia) 09/20/2015  . BP (high blood pressure) 09/20/2015  . Arthritis, degenerative 09/20/2015  . Colitis, collagenous 09/09/2015  . Absolute anemia 09/08/2014  . Adenoma of large intestine 07/13/2014  . Disturbance of skin sensation 01/09/2014    . Diabetes ([place]) 06/24/2013  . Recurrent major depressive disorder, in full remission ([place]) 07/18/2012  . Allergic rhinitis 08/29/2011   Past Medical History:  Diagnosis Date  . Anemia   . Cataract   . Diabetes mellitus without complication ([place])   . Macrocytosis   . Monoclonal gammopathy    Past Surgical History:  Procedure Laterality Date  . BONE MARROW BIOPSY Right 2016  . CATARACT EXTRACTION     bilateral  . HERNIA REPAIR     right inguinal  . PENILE PROSTHESIS IMPLANT    . PROSTATE SURGERY     BPH  . TONSILLECTOMY     Allergies  Allergen Reactions  . Bee Venom Swelling  . Metformin And Related Nausea And Vomiting  . Sulfamethoxazole-Trimethoprim Nausea And Vomiting   Prior to Admission medications   Medication Sig Start Date End Date Taking? Authorizing Provider  aspirin 81 MG tablet Take 81 mg by mouth daily.   Yes [provider]  budesonide (ENTOCORT EC) 3 MG 24 hr capsule Take 9 mg by mouth daily.    Yes [provider]  Insulin Glargine-Lixisenatide (SOLIQUA) 100-33 UNT-MCG/ML SOPN Inject into the skin.   Yes [provider]  LANTUS SOLOSTAR 100 UNIT/ML Solostar Pen Inject 20 Units into the skin at bedtime.  01/05/14  Yes [provider]  methotrexate (RHEUMATREX) 2.5 MG tablet Take 30 mg/m2 by mouth.  07/19/17  Yes [provider]  triamcinolone cream (KENALOG) 0.1 % Apply 1 application topically 2 (two) times daily. Patient taking differently: Apply 1 application topically 2 (two) times daily as needed (for basel cell spots on forehead). Pt applies for ten days, then stops and repeats 02/14/16  Yes Jaynee Eagles, PA-C  diazepam (VALIUM) 5 MG tablet Take 5 mg by mouth at bedtime as needed for sleep or muscle spasms. 08/08/16   [provider]   Social History   Socioeconomic History  . Marital status: Widowed    Spouse name: engaged  . Number of children: 8  . Years of education: college-2  . Highest education  level: Not on file  Occupational History  . Occupation: Press photographer  . Occupation: [employer]  Social Needs  . Financial resource strain: Not on file  . Food insecurity:    Worry: Not on file    Inability: Not on file  . Transportation needs:    Medical: Not on file    Non-medical: Not on file  Tobacco Use  . Smoking status: Former Smoker    Types: Cigarettes    Last attempt to quit: 05/04/1996    Years since quitting: 21.5  . Smokeless tobacco: Never Used  . Tobacco comment: Smoked for 20 years  Substance and Sexual Activity  . Alcohol use: Yes    Comment: 1-2 glasses of wine every other day  . Drug use: No  . Sexual activity: Never  Lifestyle  . Physical activity:    Days per week: Not on file  Minutes per session: Not on file  . Stress: Not on file  Relationships  . Social connections:    Talks on phone: Not on file    Gets together: Not on file    Attends religious service: Not on file    Active member of club or organization: Not on file    Attends meetings of clubs or organizations: Not on file    Relationship status: Not on file  . Intimate partner violence:    Fear of current or ex partner: Not on file    Emotionally abused: Not on file    Physically abused: Not on file    Forced sexual activity: Not on file  Other Topics Concern  . Not on file  Social History Narrative  . Not on file   Review of Systems  Constitutional: Negative for fatigue, fever and unexpected weight change.  Eyes: Negative for visual disturbance.  Respiratory: Negative for cough, chest tightness and shortness of breath.   Cardiovascular: Negative for chest pain, palpitations and leg swelling.  Gastrointestinal: Negative for abdominal pain and blood in stool.  Genitourinary: Positive for decreased urine volume, dysuria and frequency.  Neurological: Negative for dizziness, light-headedness and headaches.       Objective:   Physical Exam  Constitutional: He is oriented to  person, place, and time. He appears well-developed and well-nourished.  HENT:  Head: Normocephalic and atraumatic.  Eyes: Pupils are equal, round, and reactive to light. EOM are normal.  Neck: No JVD present. Carotid bruit is not present.  Cardiovascular: Normal rate, regular rhythm and normal heart sounds.  No murmur heard. Pulmonary/Chest: Effort normal and breath sounds normal. He has no rales.  Abdominal: There is tenderness (minimal) in the suprapubic area.  Musculoskeletal: He exhibits no edema.  Neurological: He is alert and oriented to person, place, and time.  Skin: Skin is warm and dry.  Psychiatric: He has a normal mood and affect.  Vitals reviewed.   Vitals:   10/29/17 1424  BP: 128/60  Pulse: 98  Resp: 18  Temp: 98.6 F (37 C)  TempSrc: Oral  SpO2: 97%  Weight: 170 lb 6.4 oz (77.3 kg)  Height: '5\' 9"'$  (1.753 m)   Results for orders placed or performed in visit on 10/29/17  POCT urinalysis dipstick  Result Value Ref Range   Color, UA yellow yellow   Clarity, UA cloudy (A) clear   Glucose, UA =500 (A) negative mg/dL   Bilirubin, UA negative negative   Ketones, POC UA negative negative mg/dL   Spec Grav, UA 1.015 1.010 - 1.025   Blood, UA trace-lysed (A) negative   pH, UA 6.0 5.0 - 8.0   Protein Ur, POC negative negative mg/dL   Urobilinogen, UA 0.2 0.2 or 1.0 E.U./dL   Nitrite, UA Negative Negative   Leukocytes, UA Trace (A) Negative  POCT Microscopic Urinalysis ([facility])  Result Value Ref Range   WBC,UR,HPF,POC Many (A) None WBC/hpf   RBC,UR,HPF,POC None None RBC/hpf   Bacteria Many (A) None, Too numerous to count   Mucus Absent Absent   Epithelial Cells, UR Per Microscopy Few (A) None, Too numerous to count cells/hpf       Assessment & Plan:    Tommy Peterson is a 77 y.o. male Dysuria - Plan: POCT urinalysis dipstick, POCT Microscopic Urinalysis ([facility]), Urine Culture, PSA  Urinary frequency - Plan: Basic metabolic panel, levofloxacin (LEVAQUIN) 500  MG tablet  Hyperglycemia  Urinary hesitancy - Plan: Basic metabolic panel  Prostatitis, unspecified  prostatitis type - Plan: PSA, Basic metabolic panel, levofloxacin (LEVAQUIN) 500 MG tablet  Possible prostatitis with current urinary tract infection.  Recent evaluation with urologist, urine culture test results reviewed including sensitivities.  I also looked at possible interactions with methotrexate and either Cipro or amoxicillin, both which indicate monitoring of CBCs and may affect methotrexate dosing.  Levaquin did not appear to have that same precaution.  Prior urine culture at urology did indicate sensitivity to Levaquin. -Start Levaquin, check urine culture to see if same organism from urology visit -Some frequency may be related to hyperglycemia, follow-up/discuss regimen with endocrinologist -Some hesitancy, but still able to produce urine.  Does not appear to be in retention at present, but ER precautions were discussed if that were to occur.  Check a BMP and PSA. -RTC/ER precautions discussed  Meds ordered this encounter  Medications  . levofloxacin (LEVAQUIN) 500 MG tablet    Sig: Take 1 tablet (500 mg total) by mouth daily.    Dispense:  14 tablet    Refill:  0   Patient Instructions    Start levaquin for possible prostate infection. If at any time you are unable to produce urine, go directly to emergency room as you may have urinary retention. Discuss antibiotic with your hematologist to see if closer monitoring of blood counts are needed, but I do not see that interaction.  I will check an updated urine culture to make sure this med is ok, as well as prostate test, and kidney function test.   Discuss elevated blood sugar and plan with your endocrinologist as higher readings may also contribute to frequent urination.   Return to the clinic or go to the nearest emergency room if any of your symptoms worsen or new symptoms occur.   Urinary Tract Infection, Adult A  urinary tract infection (UTI) is an infection of any part of the urinary tract. The urinary tract includes the:  Kidneys.  Ureters.  Bladder.  Urethra.  These organs make, store, and get rid of pee (urine) in the body. Follow these instructions at home:  Take over-the-counter and prescription medicines only as told by your doctor.  If you were prescribed an antibiotic medicine, take it as told by your doctor. Do not stop taking the antibiotic even if you start to feel better.  Avoid the following drinks: ? Alcohol. ? Caffeine. ? Tea. ? Carbonated drinks.  Drink enough fluid to keep your pee clear or pale yellow.  Keep all follow-up visits as told by your doctor. This is important.  Make sure to: ? Empty your bladder often and completely. Do not to hold pee for long periods of time. ? Empty your bladder before and after sex. ? Wipe from front to back after a bowel movement if you are male. Use each tissue one time when you wipe. Contact a doctor if:  You have back pain.  You have a fever.  You feel sick to your stomach (nauseous).  You throw up (vomit).  Your symptoms do not get better after 3 days.  Your symptoms go away and then come back. Get help right away if:  You have very bad back pain.  You have very bad lower belly (abdominal) pain.  You are throwing up and cannot keep down any medicines or water. This information is not intended to replace advice given to you by your health care provider. Make sure you discuss any questions you have with your health care provider. Document Released: 01/10/2008 Document  Revised: 12/30/2015 Document Reviewed: 06/14/2015 Elsevier Interactive Patient Education  [employer].      IF you received an x-ray today, you will receive an invoice from [facility]. Please contact [facility] at [phone] with questions or concerns regarding your invoice.   IF you received labwork today, you will  receive an invoice from [place]. Please contact [facility] at [phone] with questions or concerns regarding your invoice.   Our billing staff will not be able to assist you with questions regarding bills from these companies.  You will be contacted with the lab results as soon as they are available. The fastest way to get your results is to activate your My Chart account. Instructions are located on the last page of this paperwork. If you have not heard from [place] regarding the results in 2 weeks, please contact this office.      I personally performed the services described in this documentation, which was scribed in my presence. The recorded information has been reviewed and considered for accuracy and completeness, addended by me as needed, and agree with information above.  Signed,   [name], MD [facility].  10/31/17 9:35 PM

## 2017-10-29 NOTE — Patient Instructions (Addendum)
  Start levaquin for possible prostate infection. If at any time you are unable to produce urine, go directly to emergency room as you may have urinary retention. Discuss antibiotic with your hematologist to see if closer monitoring of blood counts are needed, but I do not see that interaction.  I will check an updated urine culture to make sure this med is ok, as well as prostate test, and kidney function test.   Discuss elevated blood sugar and plan with your endocrinologist as higher readings may also contribute to frequent urination.   Return to the clinic or go to the nearest emergency room if any of your symptoms worsen or new symptoms occur.   Urinary Tract Infection, Adult A urinary tract infection (UTI) is an infection of any part of the urinary tract. The urinary tract includes the:  Kidneys.  Ureters.  Bladder.  Urethra.  These organs make, store, and get rid of pee (urine) in the body. Follow these instructions at home:  Take over-the-counter and prescription medicines only as told by your doctor.  If you were prescribed an antibiotic medicine, take it as told by your doctor. Do not stop taking the antibiotic even if you start to feel better.  Avoid the following drinks: ? Alcohol. ? Caffeine. ? Tea. ? Carbonated drinks.  Drink enough fluid to keep your pee clear or pale yellow.  Keep all follow-up visits as told by your doctor. This is important.  Make sure to: ? Empty your bladder often and completely. Do not to hold pee for long periods of time. ? Empty your bladder before and after sex. ? Wipe from front to back after a bowel movement if you are male. Use each tissue one time when you wipe. Contact a doctor if:  You have back pain.  You have a fever.  You feel sick to your stomach (nauseous).  You throw up (vomit).  Your symptoms do not get better after 3 days.  Your symptoms go away and then come back. Get help right away if:  You have very bad  back pain.  You have very bad lower belly (abdominal) pain.  You are throwing up and cannot keep down any medicines or water. This information is not intended to replace advice given to you by your health care provider. Make sure you discuss any questions you have with your health care provider. Document Released: 01/10/2008 Document Revised: 12/30/2015 Document Reviewed: 06/14/2015 Elsevier Interactive Patient Education  2018 [employer].      IF you received an x-ray today, you will receive an invoice from [facility]. Please contact [facility] at [phone] with questions or concerns regarding your invoice.   IF you received labwork today, you will receive an invoice from [place]. Please contact [facility] at [phone] with questions or concerns regarding your invoice.   Our billing staff will not be able to assist you with questions regarding bills from these companies.  You will be contacted with the lab results as soon as they are available. The fastest way to get your results is to activate your My Chart account. Instructions are located on the last page of this paperwork. If you have not heard from [place] regarding the results in 2 weeks, please contact this office.

## 2017-10-30 LAB — BASIC METABOLIC PANEL
BUN / CREAT RATIO: 15 (ref 10–24)
BUN: 8 mg/dL (ref 8–27)
CHLORIDE: 105 mmol/L (ref 96–106)
CO2: 23 mmol/L (ref 20–29)
Calcium: 9 mg/dL (ref 8.6–10.2)
Creatinine, Ser: 0.54 mg/dL — ABNORMAL LOW (ref 0.76–1.27)
GFR calc Af Amer: 117 mL/min/{1.73_m2} (ref >59)
GFR calc non Af Amer: 101 mL/min/{1.73_m2} (ref >59)
GLUCOSE: 257 mg/dL — AB (ref 65–99)
Potassium: 4.5 mmol/L (ref 3.5–5.2)
Sodium: 139 mmol/L (ref 134–144)

## 2017-10-30 LAB — PSA: PROSTATE SPECIFIC AG, SERUM: 1.6 ng/mL (ref 0.0–4.0)

## 2017-10-31 ENCOUNTER — Encounter: Payer: Self-pay | Admitting: Family Medicine

## 2017-11-02 LAB — URINE CULTURE

## 2017-11-04 DIAGNOSIS — J019 Acute sinusitis, unspecified: Secondary | ICD-10-CM | POA: Diagnosis not present

## 2017-11-08 ENCOUNTER — Emergency Department (HOSPITAL_COMMUNITY)
Admission: EM | Admit: 2017-11-08 | Discharge: 2017-11-08 | Disposition: A | Discharge status: Home / Self Care | Payer: Medicare HMO | Attending: Emergency Medicine | Admitting: Emergency Medicine

## 2017-11-08 ENCOUNTER — Emergency Department (HOSPITAL_COMMUNITY): Payer: Medicare HMO

## 2017-11-08 ENCOUNTER — Encounter (HOSPITAL_COMMUNITY): Payer: Self-pay

## 2017-11-08 DIAGNOSIS — R739 Hyperglycemia, unspecified: Secondary | ICD-10-CM

## 2017-11-08 DIAGNOSIS — R109 Unspecified abdominal pain: Secondary | ICD-10-CM | POA: Diagnosis not present

## 2017-11-08 DIAGNOSIS — R1032 Left lower quadrant pain: Secondary | ICD-10-CM | POA: Diagnosis not present

## 2017-11-08 DIAGNOSIS — Z87891 Personal history of nicotine dependence: Secondary | ICD-10-CM | POA: Insufficient documentation

## 2017-11-08 DIAGNOSIS — R1012 Left upper quadrant pain: Secondary | ICD-10-CM | POA: Diagnosis present

## 2017-11-08 DIAGNOSIS — Z8701 Personal history of pneumonia (recurrent): Secondary | ICD-10-CM

## 2017-11-08 DIAGNOSIS — R1011 Right upper quadrant pain: Secondary | ICD-10-CM | POA: Diagnosis not present

## 2017-11-08 DIAGNOSIS — Z794 Long term (current) use of insulin: Secondary | ICD-10-CM | POA: Insufficient documentation

## 2017-11-08 DIAGNOSIS — R350 Frequency of micturition: Secondary | ICD-10-CM | POA: Diagnosis not present

## 2017-11-08 DIAGNOSIS — J189 Pneumonia, unspecified organism: Secondary | ICD-10-CM

## 2017-11-08 DIAGNOSIS — E1165 Type 2 diabetes mellitus with hyperglycemia: Secondary | ICD-10-CM | POA: Insufficient documentation

## 2017-11-08 DIAGNOSIS — J181 Lobar pneumonia, unspecified organism: Secondary | ICD-10-CM

## 2017-11-08 DIAGNOSIS — Z79899 Other long term (current) drug therapy: Secondary | ICD-10-CM | POA: Insufficient documentation

## 2017-11-08 DIAGNOSIS — R05 Cough: Secondary | ICD-10-CM | POA: Diagnosis not present

## 2017-11-08 HISTORY — DX: Personal history of pneumonia (recurrent): Z87.01

## 2017-11-08 LAB — CBC WITH DIFFERENTIAL/PLATELET
BASOS PCT: 0 %
Basophils Absolute: 0 10*3/uL (ref 0.0–0.1)
Eosinophils Absolute: 0.1 10*3/uL (ref 0.0–0.7)
Eosinophils Relative: 1 %
HCT: 30.4 % — ABNORMAL LOW (ref 39.0–52.0)
Hemoglobin: 10 g/dL — ABNORMAL LOW (ref 13.0–17.0)
LYMPHS ABS: 2.3 10*3/uL (ref 0.7–4.0)
LYMPHS PCT: 18 %
MCH: 38.2 pg — AB (ref 26.0–34.0)
MCHC: 32.9 g/dL (ref 30.0–36.0)
MCV: 116 fL — AB (ref 78.0–100.0)
MONO ABS: 0.6 10*3/uL (ref 0.1–1.0)
MONOS PCT: 5 %
NEUTROS ABS: 9.9 10*3/uL — AB (ref 1.7–7.7)
NEUTROS PCT: 76 %
PLATELETS: 437 10*3/uL — AB (ref 150–400)
RBC: 2.62 MIL/uL — ABNORMAL LOW (ref 4.22–5.81)
RDW: 14.7 % (ref 11.5–15.5)
WBC: 12.9 10*3/uL — ABNORMAL HIGH (ref 4.0–10.5)

## 2017-11-08 LAB — COMPREHENSIVE METABOLIC PANEL
ALT: 46 U/L (ref 17–63)
AST: 60 U/L — ABNORMAL HIGH (ref 15–41)
Albumin: 3.4 g/dL — ABNORMAL LOW (ref 3.5–5.0)
Alkaline Phosphatase: 74 U/L (ref 38–126)
Anion gap: 8 (ref 5–15)
BUN: 11 mg/dL (ref 6–20)
CALCIUM: 8.5 mg/dL — AB (ref 8.9–10.3)
CHLORIDE: 103 mmol/L (ref 101–111)
CO2: 24 mmol/L (ref 22–32)
Creatinine, Ser: 0.6 mg/dL — ABNORMAL LOW (ref 0.61–1.24)
Glucose, Bld: 238 mg/dL — ABNORMAL HIGH (ref 65–99)
Potassium: 4.7 mmol/L (ref 3.5–5.1)
Sodium: 135 mmol/L (ref 135–145)
Total Bilirubin: 2 mg/dL — ABNORMAL HIGH (ref 0.3–1.2)
Total Protein: 6.6 g/dL (ref 6.5–8.1)

## 2017-11-08 LAB — URINALYSIS, ROUTINE W REFLEX MICROSCOPIC
Bilirubin Urine: NEGATIVE
Hgb urine dipstick: NEGATIVE
Ketones, ur: 20 mg/dL — AB
Nitrite: NEGATIVE
PH: 6 (ref 5.0–8.0)
Protein, ur: NEGATIVE mg/dL
Specific Gravity, Urine: 1.012 (ref 1.005–1.030)
Squamous Epithelial / LPF: NONE SEEN

## 2017-11-08 LAB — LIPASE, BLOOD: LIPASE: 28 U/L (ref 11–51)

## 2017-11-08 IMAGING — CT CT ABD-PELV W/ CM
2 of 5 series · 16 of 46 positions shown, 18 images · IV contrast (iopamidol)
Comparison: CT scan of [DATE].

CLINICAL DATA: Acute left lower quadrant abdominal pain.

EXAM:
CT ABDOMEN AND PELVIS WITH CONTRAST
TECHNIQUE: Multidetector CT imaging of the abdomen and pelvis was performed
using the standard protocol following bolus administration of
intravenous contrast.
CONTRAST:  100mL [G0] IOPAMIDOL ([G0]) INJECTION 61%

[Series 2: axial st · axial · 0.80mm/px · z∈[-673,-278]mm · 13 of 93 slices shown, 15 images]
[im 7/93  soft-tissue]
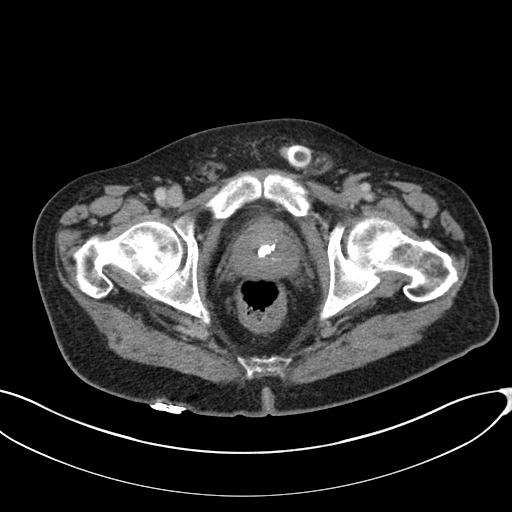
[im 7/93  bone]
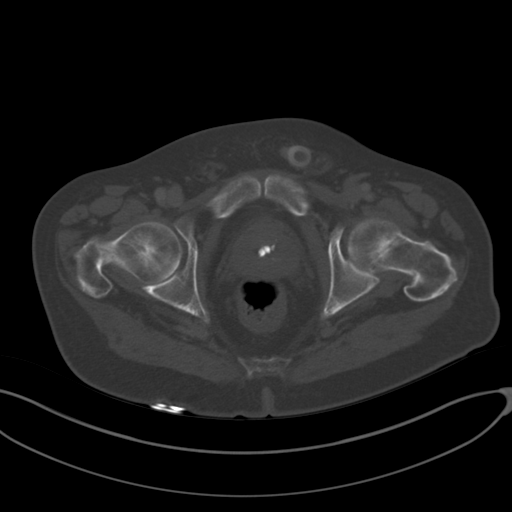
[im 14/93  soft-tissue]
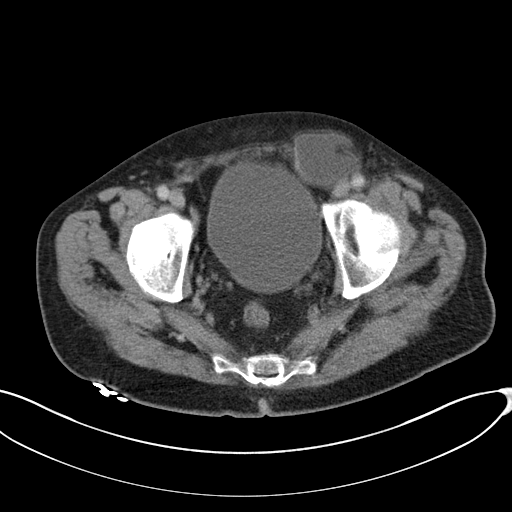
[im 20/93  soft-tissue]
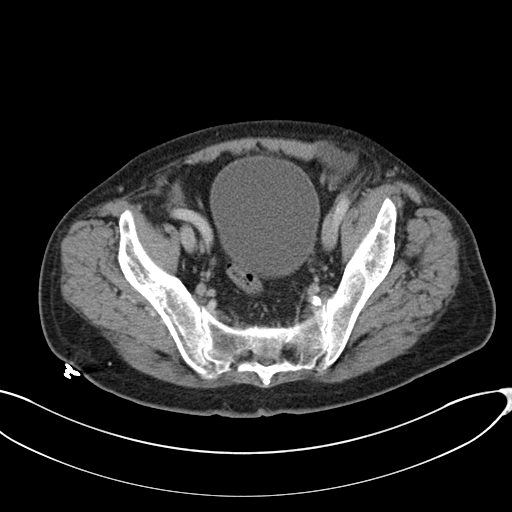
[im 27/93  soft-tissue]
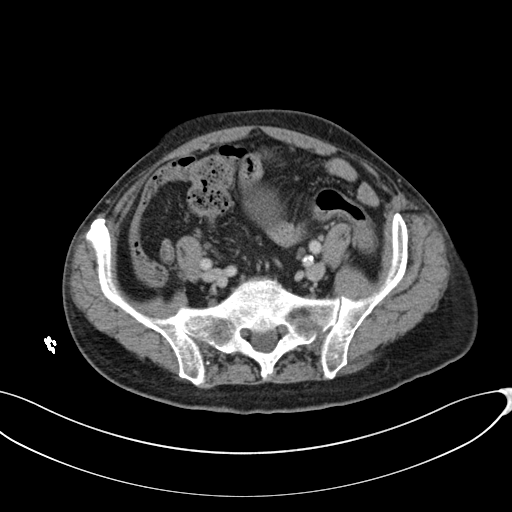
[im 33/93  soft-tissue]
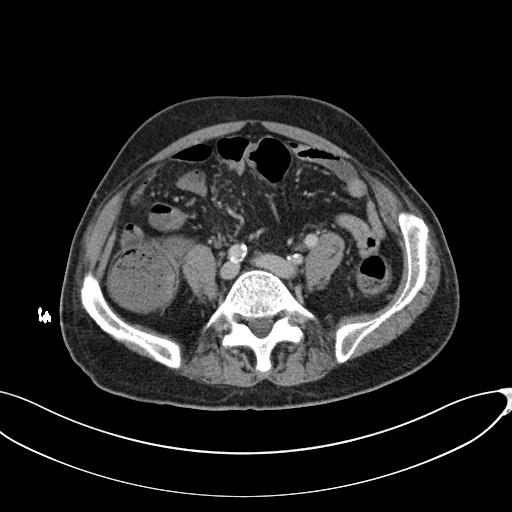
[im 40/93  soft-tissue]
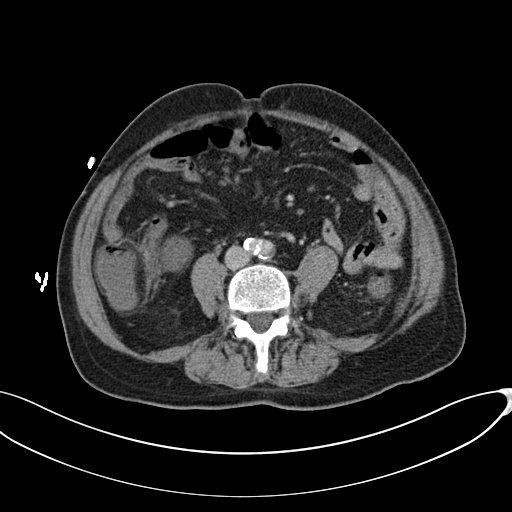
[im 47/93  soft-tissue]
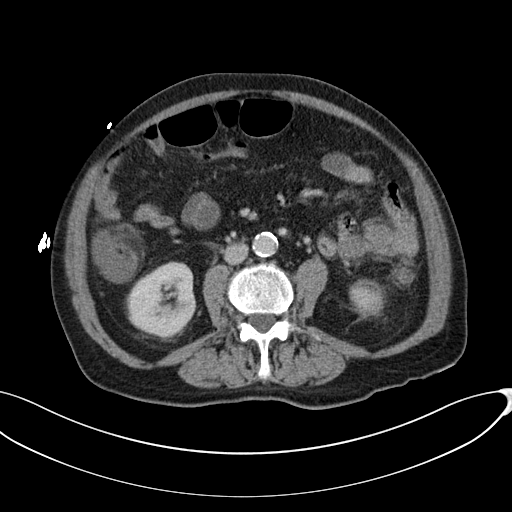
[im 53/93  soft-tissue]
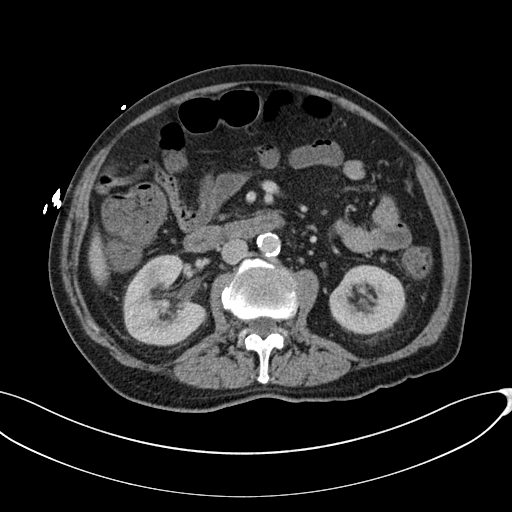
[im 60/93  soft-tissue]
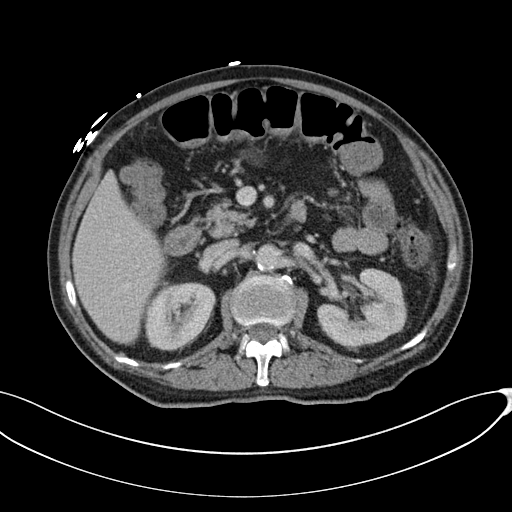
[im 60/93  bone]
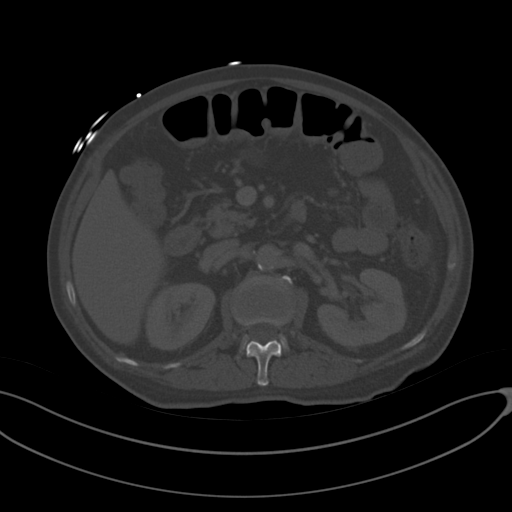
[im 66/93  soft-tissue]
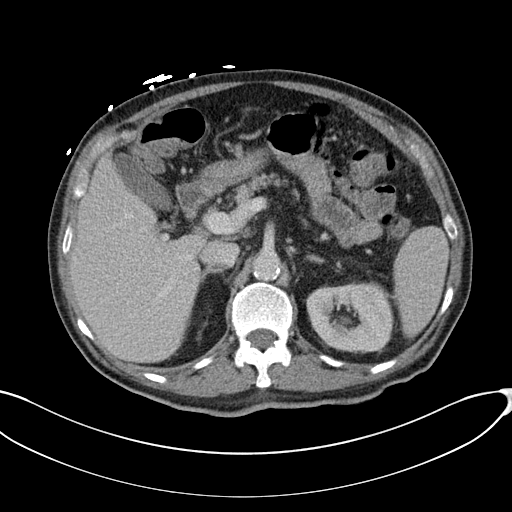
[im 73/93  soft-tissue]
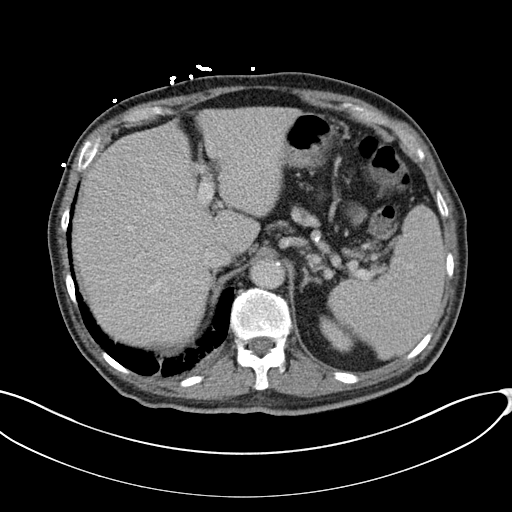
[im 79/93  soft-tissue]
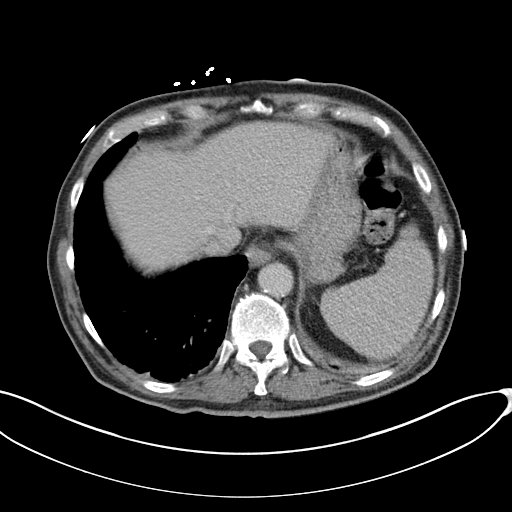
[im 86/93  soft-tissue]
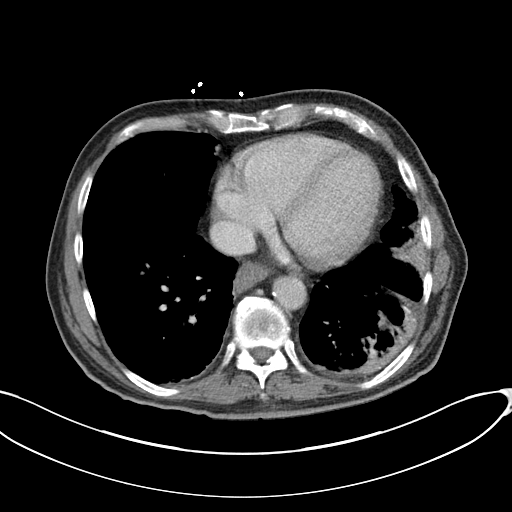

[Series 4: coronal st · coronal · 0.93mm/px · 3 of 88 slices shown]
[im 30/88  soft-tissue]
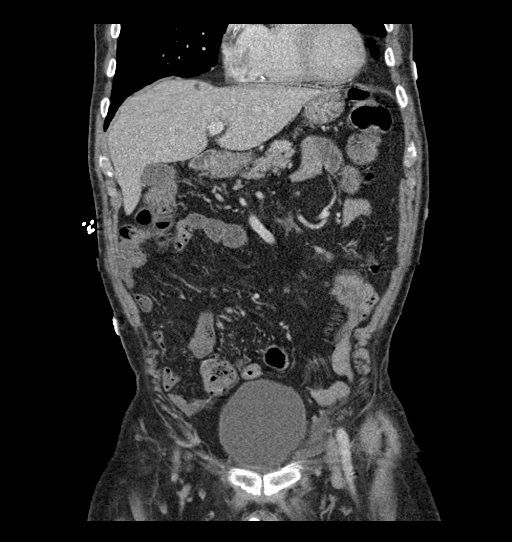
[im 39/88  soft-tissue]
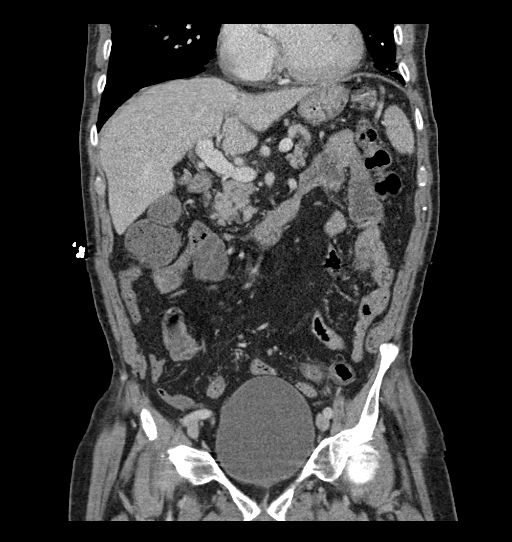
[im 49/88  soft-tissue]
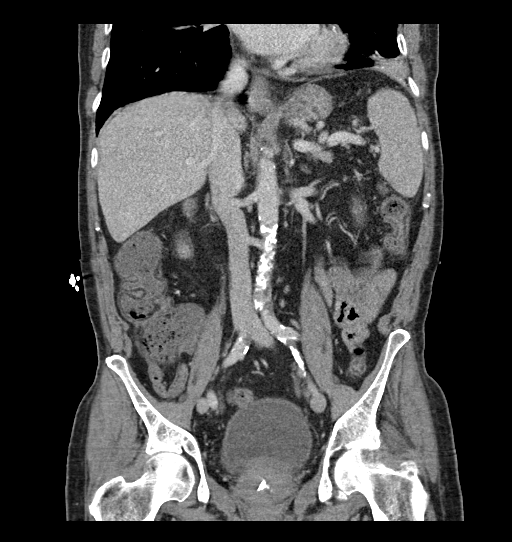

[16 of 46 positions shown; findings below may reference images not displayed]

FINDINGS: Lower chest: Mild left basilar subsegmental atelectasis or
infiltrate is noted.

Hepatobiliary: No focal liver abnormality is seen. No gallstones,
gallbladder wall thickening, or biliary dilatation.

Pancreas: Unremarkable. No pancreatic ductal dilatation or
surrounding inflammatory changes.

Spleen: Normal in size without focal abnormality.

Adrenals/Urinary Tract: Adrenal glands are unremarkable. Kidneys are
normal, without renal calculi, focal lesion, or hydronephrosis.
Bladder is unremarkable.

Stomach/Bowel: The stomach appears normal. The appendix is
unremarkable. There is no evidence of bowel obstruction. There is no
evidence of inflammation.

Vascular/Lymphatic: Aortic atherosclerosis. No enlarged abdominal or
pelvic lymph nodes.

Reproductive: Stable mild prostatic enlargement is noted with
associated calcifications.

Other: No abdominal wall hernia or abnormality. No abdominopelvic
ascites. Penile prosthesis reservoir is noted in left lower
quadrant.

Musculoskeletal: No acute or significant osseous findings.
IMPRESSION: Mild left basilar subsegmental atelectasis or infiltrate.

Stable mild prostatic enlargement.

No acute abnormality seen in the abdomen or pelvis.

Aortic Atherosclerosis ([G0]-[G0]).

## 2017-11-08 IMAGING — CR DG CHEST 2V
2 series · 2 of 2 positions shown · non-contrast
Comparison: [DATE]

CLINICAL DATA: Cough.

EXAM:
CHEST - 2 VIEW

[w chest pa]
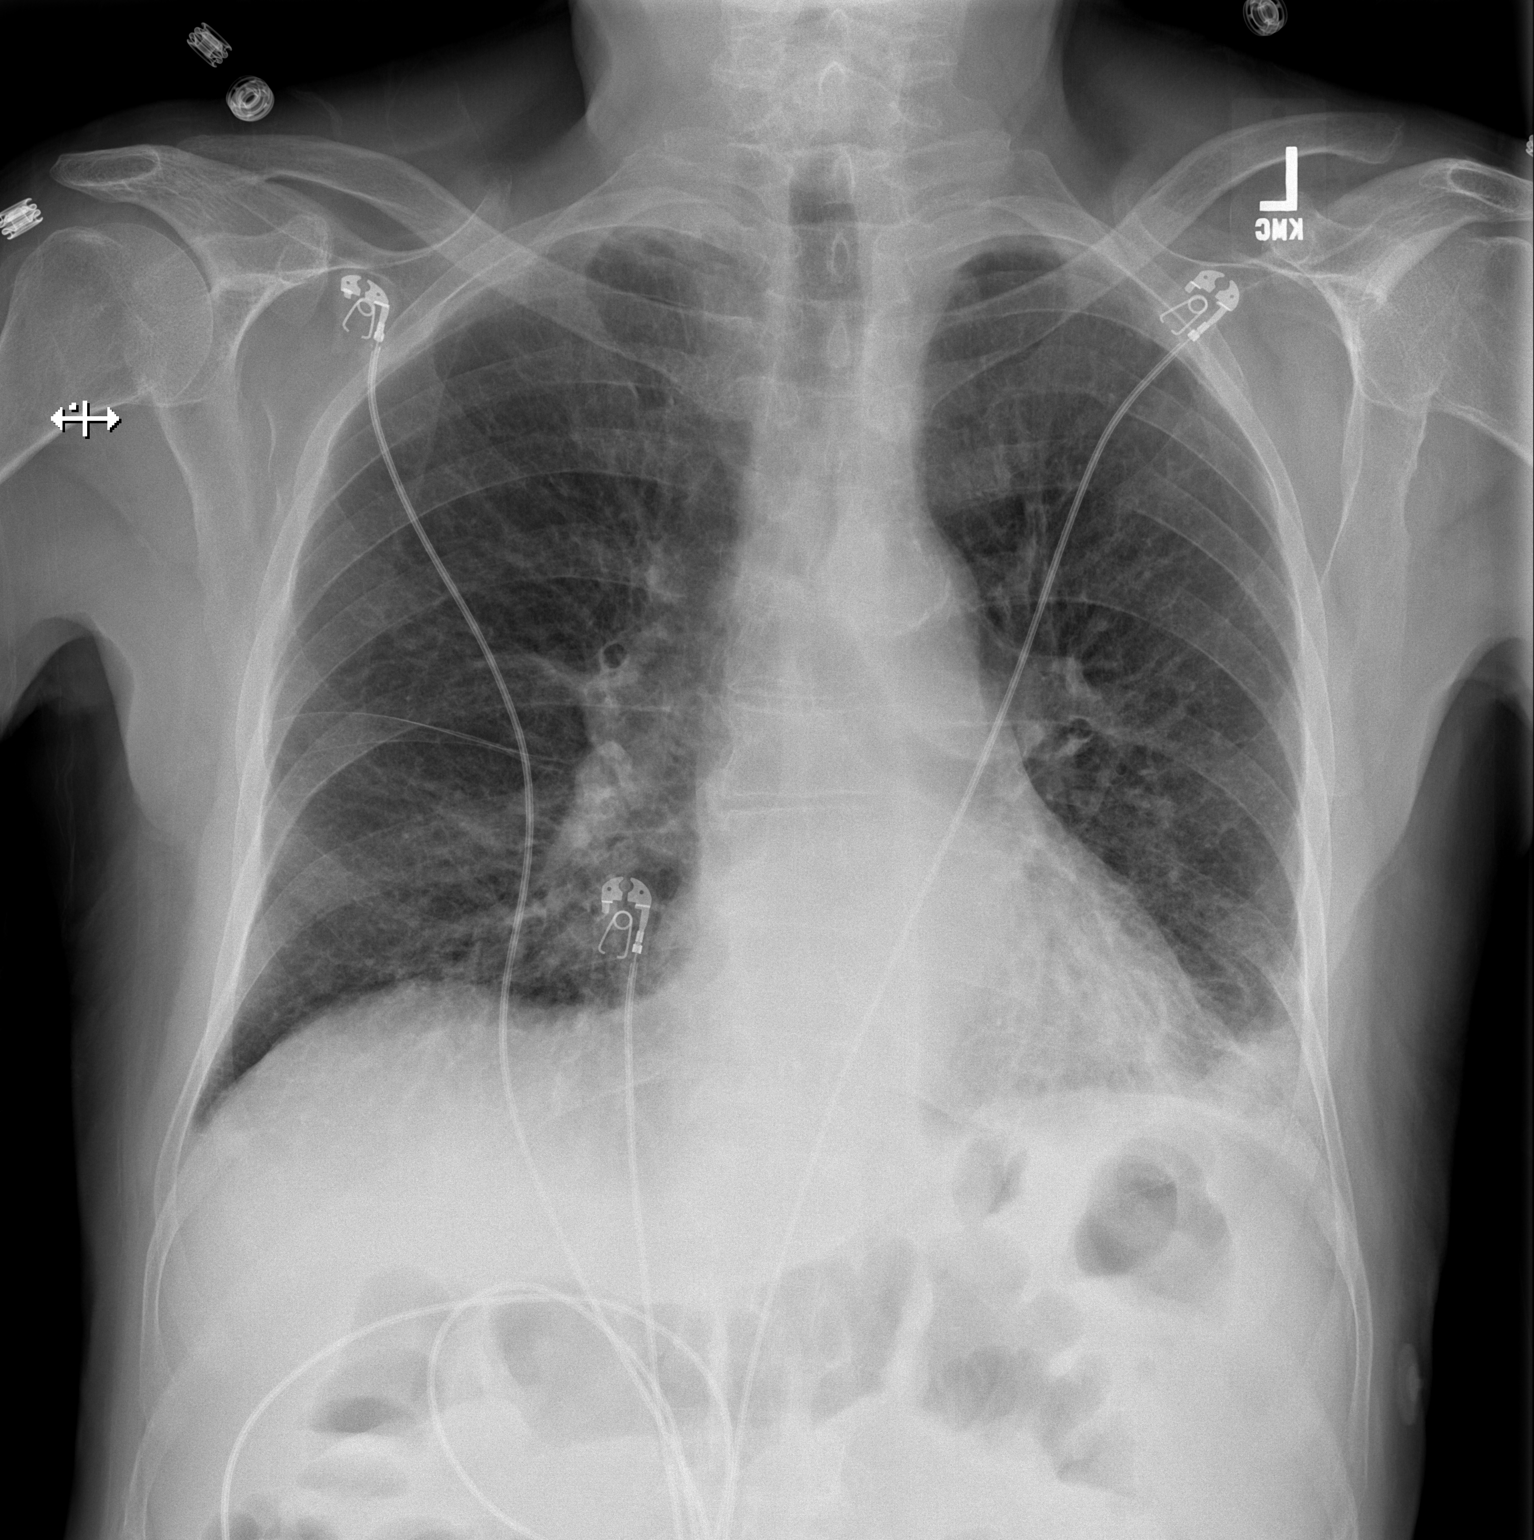

[w chest lat]
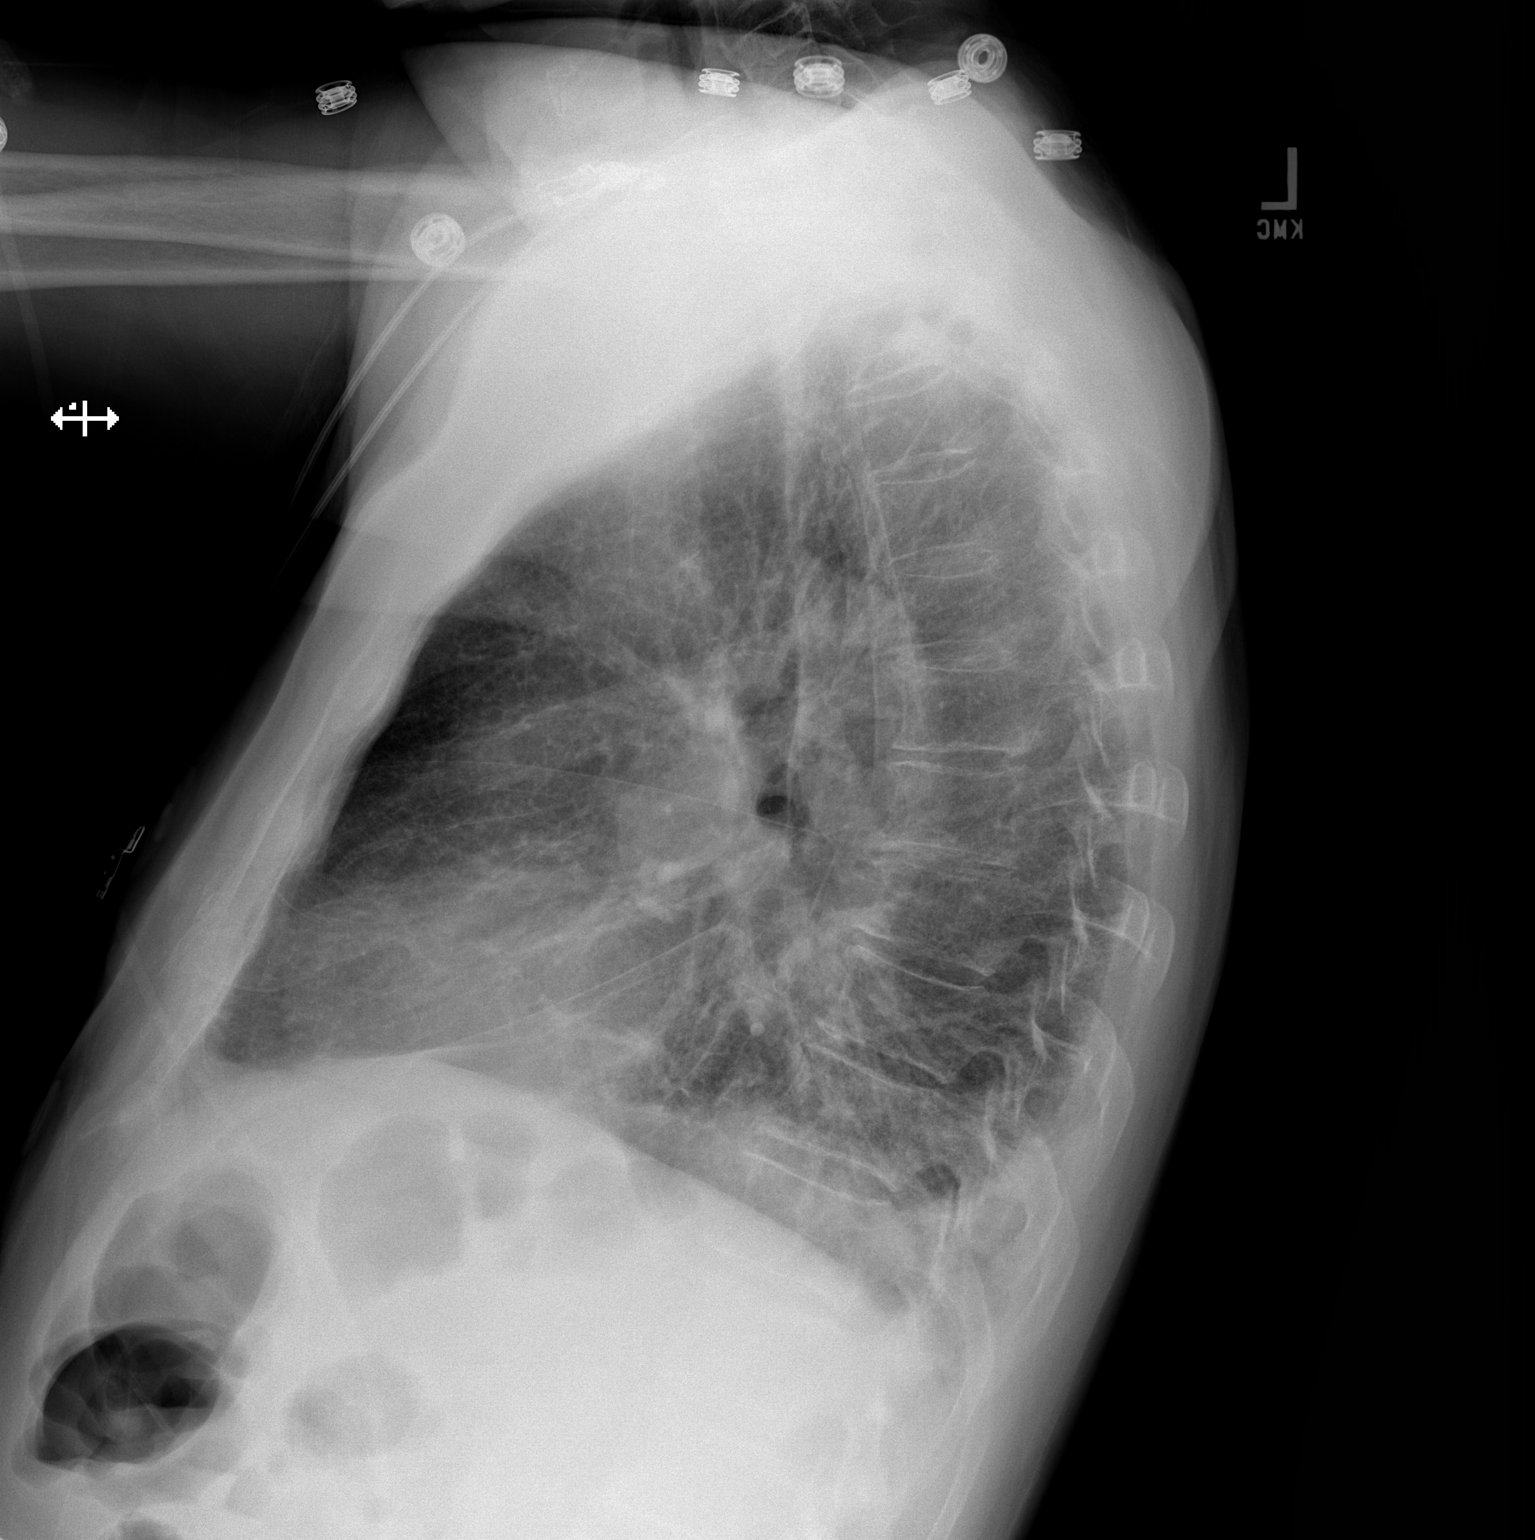

[2 of 2 positions shown; findings below may reference images not displayed]

FINDINGS: The cardiomediastinal silhouette is unchanged. The heart is normal
in size. Aortic atherosclerosis is noted. The patient has taken a
slightly shallower inspiration than on the prior study. There is
chronic peribronchial thickening and interstitial coarsening which
are stable to mildly increased from the prior study. New patchy
airspace opacity is present in the left lung base. There is mild
biapical scarring. Trace pleural effusions are questioned
bilaterally. No pneumothorax is identified. Old left rib fractures
are noted.
IMPRESSION: Chronic interstitial changes with new patchy left basilar airspace
opacity suspicious for pneumonia.

## 2017-11-08 MED ORDER — IOPAMIDOL (ISOVUE-300) INJECTION 61%
100.0000 mL | Freq: Once | INTRAVENOUS | Status: AC | PRN
  Administered 2017-11-08: 100 mL via INTRAVENOUS

## 2017-11-08 MED ORDER — CEFDINIR 300 MG PO CAPS: 300.0000 mg | ORAL_CAPSULE | Freq: Two times a day (BID) | ORAL | 0 refills | Status: DC

## 2017-11-08 MED ORDER — SODIUM CHLORIDE 0.9 % IV BOLUS
1000.0000 mL | Freq: Once | INTRAVENOUS | Status: AC
  Administered 2017-11-08: 1000 mL via INTRAVENOUS

## 2017-11-08 MED ORDER — SODIUM CHLORIDE 0.9 % IV SOLN
1.0000 g | Freq: Once | INTRAVENOUS | Status: AC
  Administered 2017-11-08: 1 g via INTRAVENOUS
  Filled 2017-11-08: qty 10

## 2017-11-08 MED ORDER — IOPAMIDOL (ISOVUE-300) INJECTION 61%
INTRAVENOUS | Status: AC
  Filled 2017-11-08: qty 100

## 2017-11-08 NOTE — ED Triage Notes (Signed)
Patient BIB EMS from home with complaints of LLQ abdominal pain, starting last night. Patient reports history of diarrhea x10 years. Patient reports last night, he had a "hard cough with a sharp pain to LLQ." Patient ambulatory. Patient denies nausea/vomiting. Patient has low grade fever of 68F for EMS- patient reports taking tylenol for his fever last night.

## 2017-11-08 NOTE — ED Notes (Signed)
Bed: WA03 Expected date:  Expected time:  Means of arrival:  Comments: EMS-abdominal pain 

## 2017-11-08 NOTE — ED Provider Notes (Signed)
[facility] DEPT Provider Note   CSN: [id number] Arrival date & time: 11/08/17  4656     History   Chief Complaint Chief Complaint  Patient presents with  . Abdominal Pain    HPI Tommy Peterson is a 77 y.o. male.  Pt presents to the ED today with LUQ pain.  The pt has had trouble with UTI for approximately a month.  He has been on 2 courses of cipro, levaquin, and amox.  The initial urine culture on 2/20 grew out e.coli and was pansensitive.  The pt said he is still urinating frequently.  Now, he has a cough with LUQ pain.  He also c/o his blood sugar running high and was switched from lantus to [place] recently.  The pt has seen his urologist for his urinary sx.  The pt said he has not had any n/v, but has not had much appetite.  He only ate 1 piece of toast yesterday.        Past Medical History:  Diagnosis Date  . Anemia   . Cataract   . Diabetes mellitus without complication ([place])   . Macrocytosis   . Monoclonal gammopathy     Patient Active Problem List   Diagnosis Date Noted  . Leukemia not having achieved remission ([place]) 12/26/2016  . Anemia 12/26/2016  . 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 Diabetes mellitus with complication ([place])   . Symptomatic anemia   . DOE (dyspnea on exertion)   . Anxiety   . History of GI bleed   . C. difficile colitis 05/31/2016  . Lactic acidosis   . History of bone marrow biopsy 05/27/2016  . Fever 05/27/2016  . Encephalopathy in sepsis 05/27/2016  . Sepsis ([place]) 05/27/2016  . Chest pain 05/04/2016  . Benign fibroma of prostate 09/20/2015  . HLD (hyperlipidemia) 09/20/2015  . BP (high blood pressure) 09/20/2015  . Arthritis, degenerative 09/20/2015  . Colitis, collagenous 09/09/2015  . Absolute anemia 09/08/2014  . Adenoma of large intestine 07/13/2014  . Disturbance of skin sensation 01/09/2014  . Diabetes ([place]) 06/24/2013  . Recurrent major depressive disorder, in full  remission ([place]) 07/18/2012  . Allergic rhinitis 08/29/2011    Past Surgical History:  Procedure Laterality Date  . BONE MARROW BIOPSY Right 2016  . CATARACT EXTRACTION     bilateral  . HERNIA REPAIR     right inguinal  . PENILE PROSTHESIS IMPLANT    . PROSTATE SURGERY     BPH  . TONSILLECTOMY          Home Medications    Prior to Admission medications   Medication Sig Start Date End Date Taking? Authorizing Provider  Insulin Glargine-Lixisenatide (SOLIQUA) 100-33 UNT-MCG/ML SOPN Inject 28 Units into the skin at bedtime.    Yes [provider]  methotrexate (RHEUMATREX) 2.5 MG tablet Take 30 mg/m2 by mouth once a week.  07/19/17  Yes [provider]  oxybutynin (DITROPAN) 5 MG tablet Take 5 mg by mouth 3 (three) times daily. 10/22/17  Yes [provider]  pioglitazone (ACTOS) 15 MG tablet Take 15 mg by mouth daily. 10/18/17  Yes [provider]  tamsulosin (FLOMAX) 0.4 MG CAPS capsule Take 0.4 mg by mouth daily. 10/18/17  Yes [provider]  triamcinolone cream (KENALOG) 0.1 % Apply 1 application topically 2 (two) times daily. Patient taking differently: Apply 1 application topically 2 (two) times daily as needed (for basel cell spots on forehead). Pt applies for ten days,  then stops and repeats 02/14/16  Yes Jaynee Eagles, PA-C  cefdinir (OMNICEF) 300 MG capsule Take 1 capsule (300 mg total) by mouth 2 (two) times daily. 11/08/17   Isla Pence, MD  levofloxacin (LEVAQUIN) 500 MG tablet Take 1 tablet (500 mg total) by mouth daily. Patient not taking: Reported on 11/08/2017 10/29/17   Wendie Agreste, MD    Family History Family History  Problem Relation Age of Onset  . Diabetes Mother   . Heart failure Mother     Social History Social History   Tobacco Use  . Smoking status: Former Smoker    Types: Cigarettes    Last attempt to quit: 05/04/1996    Years since quitting: 21.5  . Smokeless tobacco: Never Used  . Tobacco comment:  Smoked for 20 years  Substance Use Topics  . Alcohol use: Yes    Comment: 1-2 glasses of wine every other day  . Drug use: No     Allergies   Bee venom; Metformin and related; and Sulfamethoxazole-trimethoprim   Review of Systems Review of Systems  Constitutional: Positive for appetite change.  Respiratory: Positive for cough.   Gastrointestinal: Positive for abdominal pain.  Genitourinary: Positive for difficulty urinating, dysuria and frequency.  All other systems reviewed and are negative.    Physical Exam Updated Vital Signs BP 138/68   Pulse (!) 101   Resp 20   Ht '5\' 9"'$  (1.753 m)   Wt 77.1 kg (170 lb)   SpO2 93%   BMI 25.10 kg/m   Physical Exam  Constitutional: He is oriented to person, place, and time. He appears well-developed and well-nourished.  HENT:  Head: Normocephalic.  Mouth/Throat: Oropharynx is clear and moist.  Eyes: Pupils are equal, round, and reactive to light. EOM are normal.  Cardiovascular: Normal rate, regular rhythm, normal heart sounds and intact distal pulses.  Pulmonary/Chest: Effort normal and breath sounds normal.  Abdominal: Soft. Normal appearance and bowel sounds are normal. There is tenderness in the suprapubic area, left upper quadrant and left lower quadrant.  Neurological: He is alert and oriented to person, place, and time.  Generalized periodic twitching  Skin: Skin is warm. Capillary refill takes less than 2 seconds.  Psychiatric: He has a normal mood and affect. His behavior is normal.  Nursing note and vitals reviewed.    ED Treatments / Results  Labs (all labs ordered are listed, but only abnormal results are displayed) Labs Reviewed  CBC WITH DIFFERENTIAL/PLATELET - Abnormal; Notable for the following components:      Result Value   WBC 12.9 (*)    RBC 2.62 (*)    Hemoglobin 10.0 (*)    HCT 30.4 (*)    MCV 116.0 (*)    MCH 38.2 (*)    Platelets 437 (*)    Neutro Abs 9.9 (*)    All other components within  normal limits  COMPREHENSIVE METABOLIC PANEL - Abnormal; Notable for the following components:   Glucose, Bld 238 (*)    Creatinine, Ser 0.60 (*)    Calcium 8.5 (*)    Albumin 3.4 (*)    AST 60 (*)    Total Bilirubin 2.0 (*)    All other components within normal limits  URINALYSIS, ROUTINE W REFLEX MICROSCOPIC - Abnormal; Notable for the following components:   Glucose, UA >=500 (*)    Ketones, ur 20 (*)    Leukocytes, UA TRACE (*)    Bacteria, UA RARE (*)    All other components  within normal limits  URINE CULTURE  LIPASE, BLOOD    EKG EKG Interpretation  Date/Time:  Thursday November 08 2017 10:16:56 EDT Ventricular Rate:  100 PR Interval:  176 QRS Duration: 100 QT Interval:  350 QTC Calculation: 451 R Axis:   -38 Text Interpretation:  Normal sinus rhythm Left axis deviation Abnormal ECG No significant change since last tracing Confirmed by Isla Pence [phone]) on 11/08/2017 10:33:26 AM   Radiology Dg Chest 2 View  Result Date: 11/08/2017 CLINICAL DATA:  Cough. EXAM: CHEST - 2 VIEW COMPARISON:  05/02/2017 FINDINGS: The cardiomediastinal silhouette is unchanged. The heart is normal in size. Aortic atherosclerosis is noted. The patient has taken a slightly shallower inspiration than on the prior study. There is chronic peribronchial thickening and interstitial coarsening which are stable to mildly increased from the prior study. New patchy airspace opacity is present in the left lung base. There is mild biapical scarring. Trace pleural effusions are questioned bilaterally. No pneumothorax is identified. Old left rib fractures are noted. IMPRESSION: Chronic interstitial changes with new patchy left basilar airspace opacity suspicious for pneumonia. Electronically Signed   By: [place] Bores M.D.   On: 11/08/2017 10:50   Ct Abdomen Pelvis W Contrast  Result Date: 11/08/2017 CLINICAL DATA:  Acute left lower quadrant abdominal pain. EXAM: CT ABDOMEN AND PELVIS WITH CONTRAST TECHNIQUE:  Multidetector CT imaging of the abdomen and pelvis was performed using the standard protocol following bolus administration of intravenous contrast. CONTRAST:  145m ISOVUE-300 IOPAMIDOL (ISOVUE-300) INJECTION 61% COMPARISON:  CT scan of August 01, 2015. FINDINGS: Lower chest: Mild left basilar subsegmental atelectasis or infiltrate is noted. Hepatobiliary: No focal liver abnormality is seen. No gallstones, gallbladder wall thickening, or biliary dilatation. Pancreas: Unremarkable. No pancreatic ductal dilatation or surrounding inflammatory changes. Spleen: Normal in size without focal abnormality. Adrenals/Urinary Tract: Adrenal glands are unremarkable. Kidneys are normal, without renal calculi, focal lesion, or hydronephrosis. Bladder is unremarkable. Stomach/Bowel: The stomach appears normal. The appendix is unremarkable. There is no evidence of bowel obstruction. There is no evidence of inflammation. Vascular/Lymphatic: Aortic atherosclerosis. No enlarged abdominal or pelvic lymph nodes. Reproductive: Stable mild prostatic enlargement is noted with associated calcifications. Other: No abdominal wall hernia or abnormality. No abdominopelvic ascites. Penile prosthesis reservoir is noted in left lower quadrant. Musculoskeletal: No acute or significant osseous findings. IMPRESSION: Mild left basilar subsegmental atelectasis or infiltrate. Stable mild prostatic enlargement. No acute abnormality seen in the abdomen or pelvis. Aortic Atherosclerosis (ICD10-I70.0). Electronically Signed   By: JMarijo Conception M.D.   On: 11/08/2017 12:41    Procedures Procedures (including critical care time)  Medications Ordered in ED Medications  iopamidol (ISOVUE-300) 61 % injection (has no administration in time range)  sodium chloride 0.9 % bolus 1,000 mL (0 mLs Intravenous Stopped 11/08/17 1221)  iopamidol (ISOVUE-300) 61 % injection 100 mL (100 mLs Intravenous Contrast Given 11/08/17 1208)  cefTRIAXone (ROCEPHIN) 1 g in  sodium chloride 0.9 % 100 mL IVPB (0 g Intravenous Stopped 11/08/17 1355)     Initial Impression / Assessment and Plan / ED Course  I have reviewed the triage vital signs and the nursing notes.  Pertinent labs & imaging results that were available during my care of the patient were reviewed by me and considered in my medical decision making (see chart for details).    Pt is feeling better after IVFs.  The pt's abd pain likely from CAP.  The pt has not been taking his oxybutynin because he did not understand  what it is for.  I told him it may help his bladder spasms causing him to urinate so frequently.  He is also told to keep tight control over his blood sugar as that will also help urinary frequency.  The pt does not show evidence of uti today, but urine will be sent for culture.  Pt has been on augmentin and levaquin and still has pna, so he was d/c home on omnicef.  Pt able to ambulate without problems.  Return if worse.  F/u with pcp.  Final Clinical Impressions(s) / ED Diagnoses   Final diagnoses:  Community acquired pneumonia of left lower lobe of lung ([place])  Urinary frequency  Hyperglycemia    ED Discharge Orders        Ordered    cefdinir (OMNICEF) 300 MG capsule  2 times daily     11/08/17 1420       Isla Pence, MD 11/08/17 1425

## 2017-11-08 NOTE — ED Notes (Signed)
Patient ambulated 50 feet in the ER with standby assistance. Patient's heart rate went as high as 108, and oxygen saturation dropped as low as 93% on RA.

## 2017-11-08 NOTE — ED Notes (Signed)
This nurse walked patient to the lobby. Patient ambulated well without assistance. Patient verbalizes he feels "steady" on his feet. Patient called a ride and will wait for them in the waiting room.

## 2017-11-09 LAB — URINE CULTURE: Culture: NO GROWTH

## 2017-11-16 DIAGNOSIS — E1165 Type 2 diabetes mellitus with hyperglycemia: Secondary | ICD-10-CM | POA: Diagnosis not present

## 2017-11-16 DIAGNOSIS — R399 Unspecified symptoms and signs involving the genitourinary system: Secondary | ICD-10-CM | POA: Diagnosis not present

## 2017-11-16 DIAGNOSIS — J181 Lobar pneumonia, unspecified organism: Secondary | ICD-10-CM | POA: Diagnosis not present

## 2017-11-16 DIAGNOSIS — N400 Enlarged prostate without lower urinary tract symptoms: Secondary | ICD-10-CM | POA: Diagnosis not present

## 2017-11-19 DIAGNOSIS — R399 Unspecified symptoms and signs involving the genitourinary system: Secondary | ICD-10-CM | POA: Diagnosis not present

## 2017-11-19 DIAGNOSIS — N400 Enlarged prostate without lower urinary tract symptoms: Secondary | ICD-10-CM | POA: Diagnosis not present

## 2017-11-19 DIAGNOSIS — J181 Lobar pneumonia, unspecified organism: Secondary | ICD-10-CM | POA: Diagnosis not present

## 2017-11-19 DIAGNOSIS — E1165 Type 2 diabetes mellitus with hyperglycemia: Secondary | ICD-10-CM | POA: Diagnosis not present

## 2017-11-20 ENCOUNTER — Encounter (HOSPITAL_COMMUNITY): Payer: Self-pay | Admitting: Emergency Medicine

## 2017-11-20 ENCOUNTER — Emergency Department (HOSPITAL_COMMUNITY): Payer: Medicare HMO

## 2017-11-20 ENCOUNTER — Inpatient Hospital Stay (HOSPITAL_COMMUNITY): Payer: Medicare HMO

## 2017-11-20 ENCOUNTER — Inpatient Hospital Stay (HOSPITAL_COMMUNITY)
Admission: EM | Admit: 2017-11-20 | Discharge: 2017-11-24 | DRG: 871 | Disposition: A | Discharge status: Home / Self Care | Payer: Medicare HMO | Attending: Internal Medicine | Admitting: Internal Medicine

## 2017-11-20 DIAGNOSIS — D472 Monoclonal gammopathy: Secondary | ICD-10-CM | POA: Diagnosis not present

## 2017-11-20 DIAGNOSIS — D539 Nutritional anemia, unspecified: Secondary | ICD-10-CM | POA: Diagnosis present

## 2017-11-20 DIAGNOSIS — Z794 Long term (current) use of insulin: Secondary | ICD-10-CM

## 2017-11-20 DIAGNOSIS — E119 Type 2 diabetes mellitus without complications: Secondary | ICD-10-CM | POA: Diagnosis not present

## 2017-11-20 DIAGNOSIS — R112 Nausea with vomiting, unspecified: Secondary | ICD-10-CM | POA: Diagnosis not present

## 2017-11-20 DIAGNOSIS — Z9114 Patient's other noncompliance with medication regimen: Secondary | ICD-10-CM

## 2017-11-20 DIAGNOSIS — N401 Enlarged prostate with lower urinary tract symptoms: Secondary | ICD-10-CM | POA: Diagnosis present

## 2017-11-20 DIAGNOSIS — J181 Lobar pneumonia, unspecified organism: Secondary | ICD-10-CM

## 2017-11-20 DIAGNOSIS — J9811 Atelectasis: Secondary | ICD-10-CM | POA: Diagnosis not present

## 2017-11-20 DIAGNOSIS — R109 Unspecified abdominal pain: Secondary | ICD-10-CM

## 2017-11-20 DIAGNOSIS — C91Z0 Other lymphoid leukemia not having achieved remission: Secondary | ICD-10-CM | POA: Diagnosis not present

## 2017-11-20 DIAGNOSIS — R6521 Severe sepsis with septic shock: Secondary | ICD-10-CM | POA: Diagnosis present

## 2017-11-20 DIAGNOSIS — K52831 Collagenous colitis: Secondary | ICD-10-CM | POA: Diagnosis present

## 2017-11-20 DIAGNOSIS — R197 Diarrhea, unspecified: Secondary | ICD-10-CM | POA: Diagnosis not present

## 2017-11-20 DIAGNOSIS — R000 Tachycardia, unspecified: Secondary | ICD-10-CM | POA: Diagnosis not present

## 2017-11-20 DIAGNOSIS — I10 Essential (primary) hypertension: Secondary | ICD-10-CM | POA: Diagnosis present

## 2017-11-20 DIAGNOSIS — Z7952 Long term (current) use of systemic steroids: Secondary | ICD-10-CM | POA: Diagnosis not present

## 2017-11-20 DIAGNOSIS — R6520 Severe sepsis without septic shock: Secondary | ICD-10-CM

## 2017-11-20 DIAGNOSIS — Z87891 Personal history of nicotine dependence: Secondary | ICD-10-CM | POA: Diagnosis not present

## 2017-11-20 DIAGNOSIS — Z79899 Other long term (current) drug therapy: Secondary | ICD-10-CM | POA: Diagnosis not present

## 2017-11-20 DIAGNOSIS — A084 Viral intestinal infection, unspecified: Secondary | ICD-10-CM | POA: Diagnosis present

## 2017-11-20 DIAGNOSIS — R3915 Urgency of urination: Secondary | ICD-10-CM | POA: Diagnosis present

## 2017-11-20 DIAGNOSIS — N390 Urinary tract infection, site not specified: Secondary | ICD-10-CM | POA: Diagnosis present

## 2017-11-20 DIAGNOSIS — J189 Pneumonia, unspecified organism: Secondary | ICD-10-CM | POA: Diagnosis present

## 2017-11-20 DIAGNOSIS — E785 Hyperlipidemia, unspecified: Secondary | ICD-10-CM | POA: Diagnosis present

## 2017-11-20 DIAGNOSIS — A419 Sepsis, unspecified organism: Principal | ICD-10-CM

## 2017-11-20 DIAGNOSIS — N400 Enlarged prostate without lower urinary tract symptoms: Secondary | ICD-10-CM

## 2017-11-20 DIAGNOSIS — R05 Cough: Secondary | ICD-10-CM | POA: Diagnosis not present

## 2017-11-20 LAB — CBC WITH DIFFERENTIAL/PLATELET
BASOS ABS: 0 10*3/uL (ref 0.0–0.1)
Basophils Relative: 0 %
EOS ABS: 0.2 10*3/uL (ref 0.0–0.7)
Eosinophils Relative: 1 %
HCT: 29.6 % — ABNORMAL LOW (ref 39.0–52.0)
HEMOGLOBIN: 9.7 g/dL — AB (ref 13.0–17.0)
LYMPHS ABS: 1.1 10*3/uL (ref 0.7–4.0)
LYMPHS PCT: 6 %
MCH: 37.6 pg — ABNORMAL HIGH (ref 26.0–34.0)
MCHC: 32.8 g/dL (ref 30.0–36.0)
MCV: 114.7 fL — ABNORMAL HIGH (ref 78.0–100.0)
Monocytes Absolute: 0.9 10*3/uL (ref 0.1–1.0)
Monocytes Relative: 5 %
NEUTROS ABS: 15.3 10*3/uL — AB (ref 1.7–7.7)
Neutrophils Relative %: 88 %
PLATELETS: 420 10*3/uL — AB (ref 150–400)
RBC: 2.58 MIL/uL — ABNORMAL LOW (ref 4.22–5.81)
RDW: 14.9 % (ref 11.5–15.5)
WBC: 17.5 10*3/uL — ABNORMAL HIGH (ref 4.0–10.5)

## 2017-11-20 LAB — GASTROINTESTINAL PANEL BY PCR, STOOL (REPLACES STOOL CULTURE)
Adenovirus F40/41: NOT DETECTED
Astrovirus: NOT DETECTED
Campylobacter species: NOT DETECTED
Cryptosporidium: NOT DETECTED
Cyclospora cayetanensis: NOT DETECTED
ENTEROAGGREGATIVE E COLI (EAEC): NOT DETECTED
ENTEROTOXIGENIC E COLI (ETEC): NOT DETECTED
Entamoeba histolytica: NOT DETECTED
Enteropathogenic E coli (EPEC): NOT DETECTED
GIARDIA LAMBLIA: NOT DETECTED
NOROVIRUS GI/GII: NOT DETECTED
Plesimonas shigelloides: NOT DETECTED
ROTAVIRUS A: NOT DETECTED
SALMONELLA SPECIES: NOT DETECTED
SAPOVIRUS (I, II, IV, AND V): NOT DETECTED
SHIGA LIKE TOXIN PRODUCING E COLI (STEC): NOT DETECTED
SHIGELLA/ENTEROINVASIVE E COLI (EIEC): NOT DETECTED
Vibrio cholerae: NOT DETECTED
Vibrio species: NOT DETECTED
Yersinia enterocolitica: NOT DETECTED

## 2017-11-20 LAB — COMPREHENSIVE METABOLIC PANEL
ALBUMIN: 3.4 g/dL — AB (ref 3.5–5.0)
ALK PHOS: 73 U/L (ref 38–126)
ALT: 20 U/L (ref 17–63)
ANION GAP: 11 (ref 5–15)
AST: 21 U/L (ref 15–41)
BILIRUBIN TOTAL: 1.1 mg/dL (ref 0.3–1.2)
BUN: 11 mg/dL (ref 6–20)
CALCIUM: 9 mg/dL (ref 8.9–10.3)
CO2: 21 mmol/L — ABNORMAL LOW (ref 22–32)
Chloride: 104 mmol/L (ref 101–111)
Creatinine, Ser: 0.87 mg/dL (ref 0.61–1.24)
GFR calc non Af Amer: >60 mL/min (ref >60)
GLUCOSE: 329 mg/dL — AB (ref 65–99)
Potassium: 4.3 mmol/L (ref 3.5–5.1)
Sodium: 136 mmol/L (ref 135–145)
TOTAL PROTEIN: 6.2 g/dL — AB (ref 6.5–8.1)

## 2017-11-20 LAB — I-STAT ARTERIAL BLOOD GAS, ED
ACID-BASE DEFICIT: 4 mmol/L — AB (ref 0.0–2.0)
Bicarbonate: 18.9 mmol/L — ABNORMAL LOW (ref 20.0–28.0)
O2 Saturation: 93 %
TCO2: 20 mmol/L — ABNORMAL LOW (ref 22–32)
pCO2 arterial: 31.6 mmHg — ABNORMAL LOW (ref 32.0–48.0)
pH, Arterial: 7.399 (ref 7.350–7.450)
pO2, Arterial: 80 mmHg — ABNORMAL LOW (ref 83.0–108.0)

## 2017-11-20 LAB — CBG MONITORING, ED
GLUCOSE-CAPILLARY: 141 mg/dL — AB (ref 65–99)
Glucose-Capillary: 270 mg/dL — ABNORMAL HIGH (ref 65–99)
Glucose-Capillary: 138 mg/dL — ABNORMAL HIGH (ref 65–99)
Glucose-Capillary: 145 mg/dL — ABNORMAL HIGH (ref 65–99)

## 2017-11-20 LAB — URINALYSIS, ROUTINE W REFLEX MICROSCOPIC
Bilirubin Urine: NEGATIVE
Hgb urine dipstick: NEGATIVE
Ketones, ur: 5 mg/dL — AB
NITRITE: NEGATIVE
PH: 7 (ref 5.0–8.0)
Protein, ur: NEGATIVE mg/dL
SPECIFIC GRAVITY, URINE: 1.011 (ref 1.005–1.030)

## 2017-11-20 LAB — I-STAT CG4 LACTIC ACID, ED
Lactic Acid, Venous: 3.13 mmol/L (ref 0.5–1.9)
Lactic Acid, Venous: 1.26 mmol/L (ref 0.5–1.9)

## 2017-11-20 LAB — INFLUENZA PANEL BY PCR (TYPE A & B)
INFLAPCR: NEGATIVE
Influenza B By PCR: NEGATIVE

## 2017-11-20 LAB — STREP PNEUMONIAE URINARY ANTIGEN: Strep Pneumo Urinary Antigen: NEGATIVE

## 2017-11-20 LAB — HIV ANTIBODY (ROUTINE TESTING W REFLEX): HIV Screen 4th Generation wRfx: NONREACTIVE

## 2017-11-20 LAB — C DIFFICILE QUICK SCREEN W PCR REFLEX
C DIFFICILE (CDIFF) INTERP: NOT DETECTED
C DIFFICILE (CDIFF) TOXIN: NEGATIVE
C Diff antigen: NEGATIVE

## 2017-11-20 IMAGING — DX DG ABD PORTABLE 1V
2 series · 2 of 2 positions shown · non-contrast
Comparison: Abdominal CT from 12 days ago

CLINICAL DATA: Abdominal pain

EXAM:
PORTABLE ABDOMEN - 1 VIEW

[abdomen kub (1 of 2)]
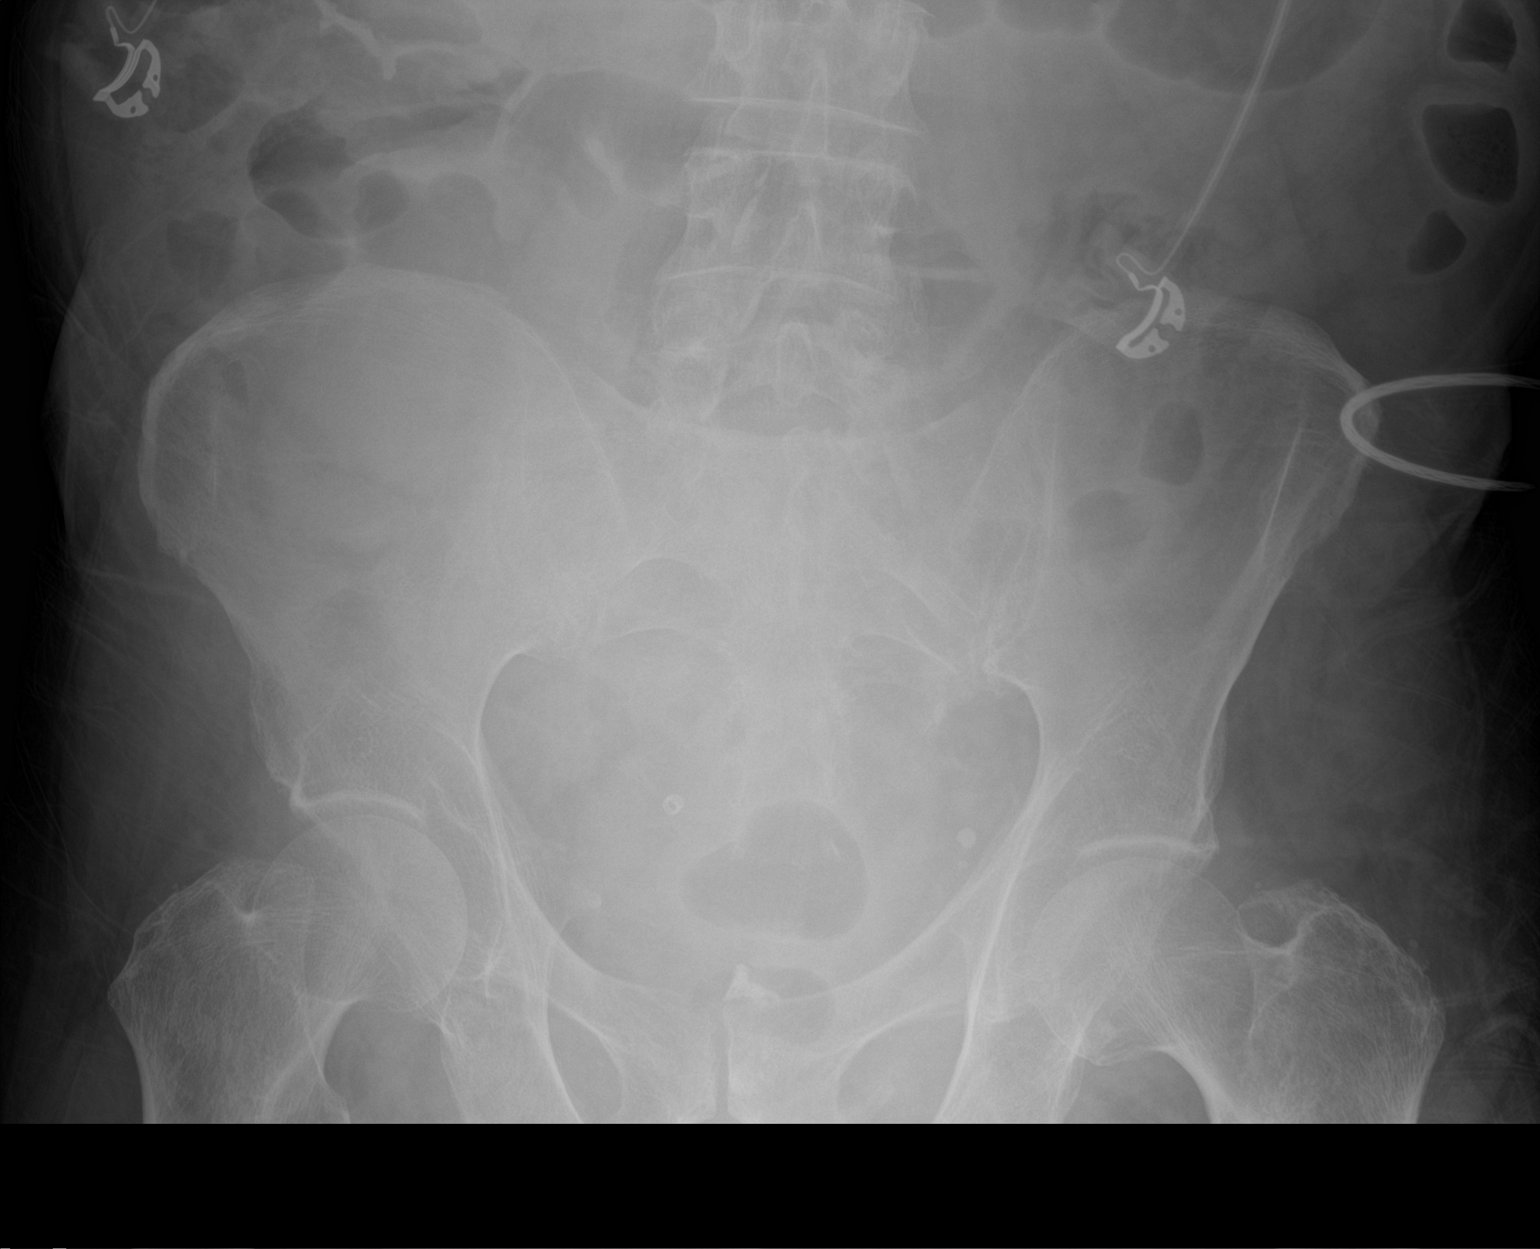

[abdomen kub (2 of 2)]
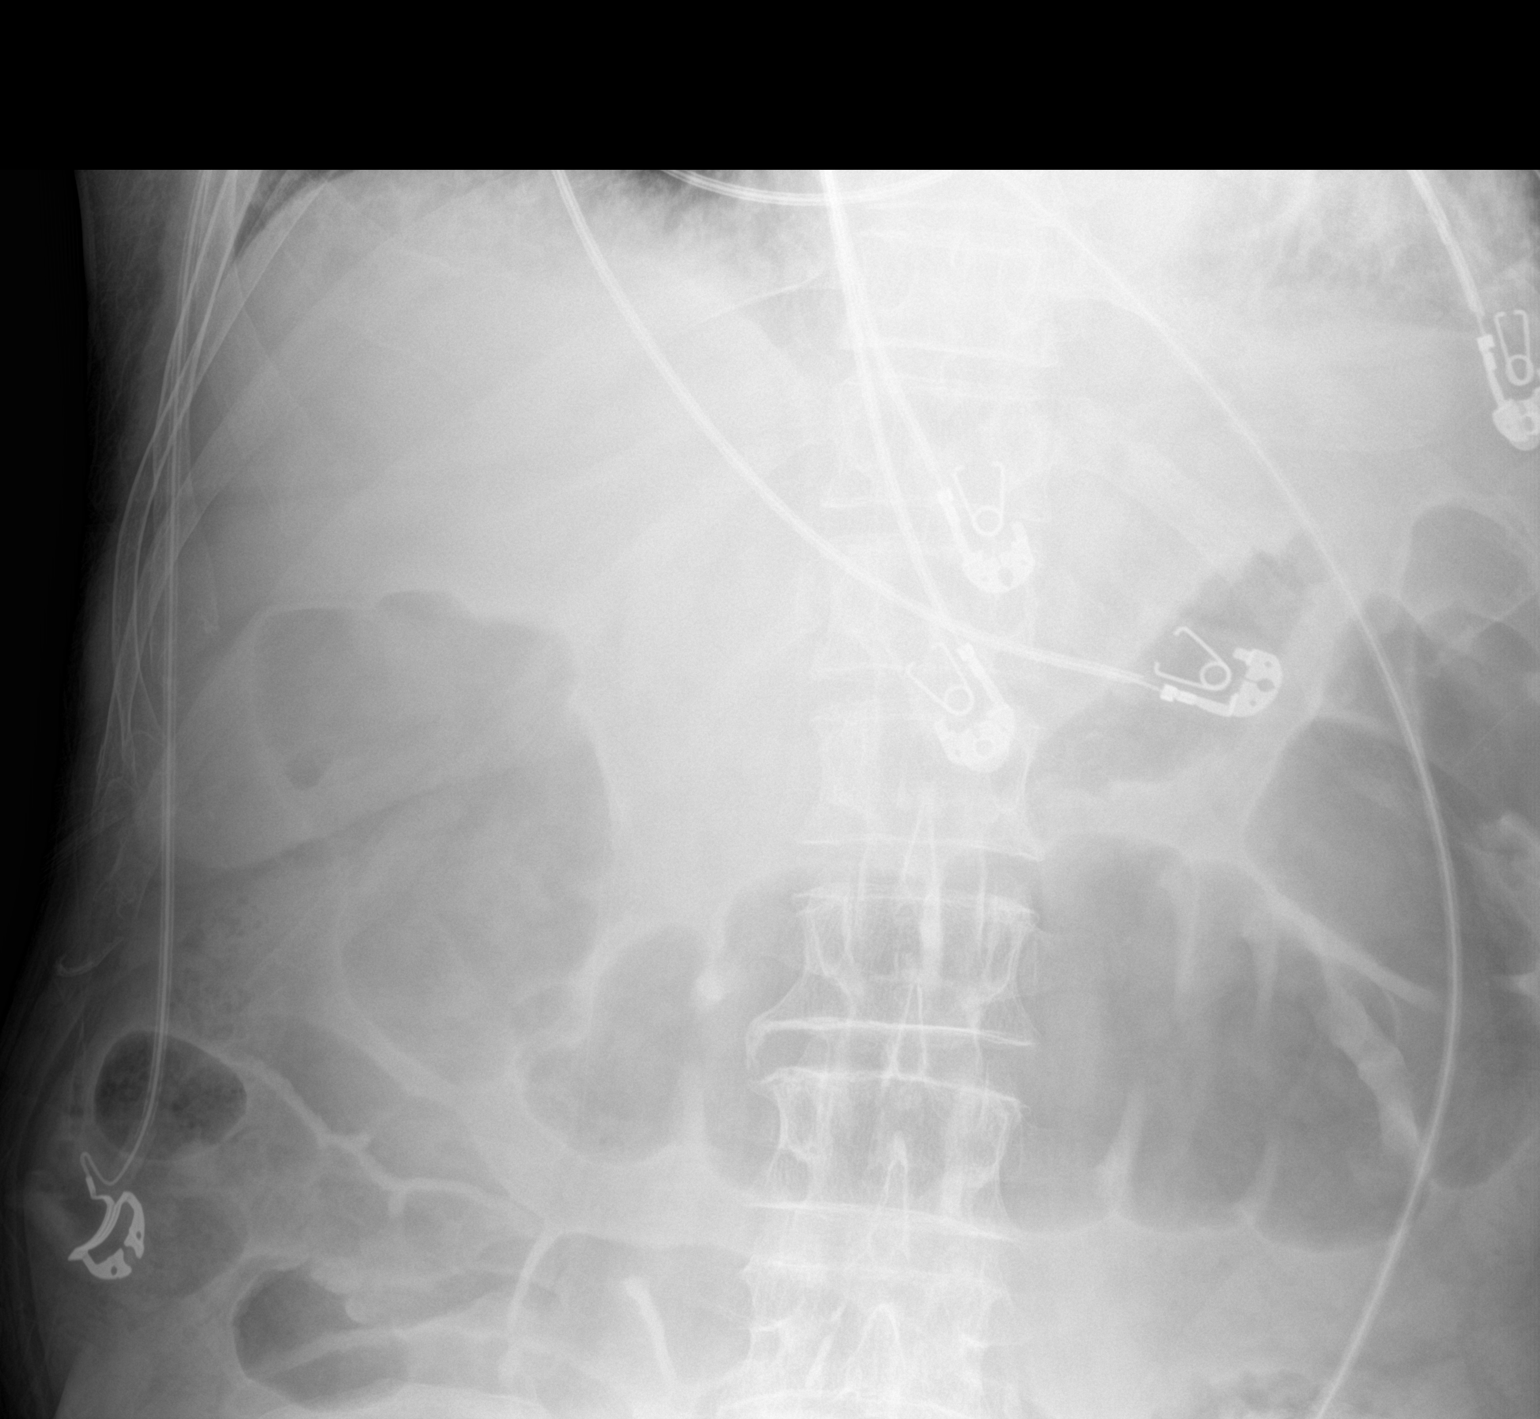

[2 of 2 positions shown; findings below may reference images not displayed]

FINDINGS: Moderate gas in the transverse colon. Overall nonobstructive bowel
gas pattern. No concerning mass effect or gas collection. Mild
streaky densities at the bases, reference contemporaneous chest
x-ray.
IMPRESSION: Nonobstructive bowel gas pattern.

## 2017-11-20 IMAGING — DX DG CHEST 1V PORT
1 series · 1 of 1 positions shown · non-contrast
Comparison: Chest radiograph [DATE]

CLINICAL DATA: Cough, nausea, vomiting and diarrhea.

EXAM:
PORTABLE CHEST 1 VIEW

[chest ap]
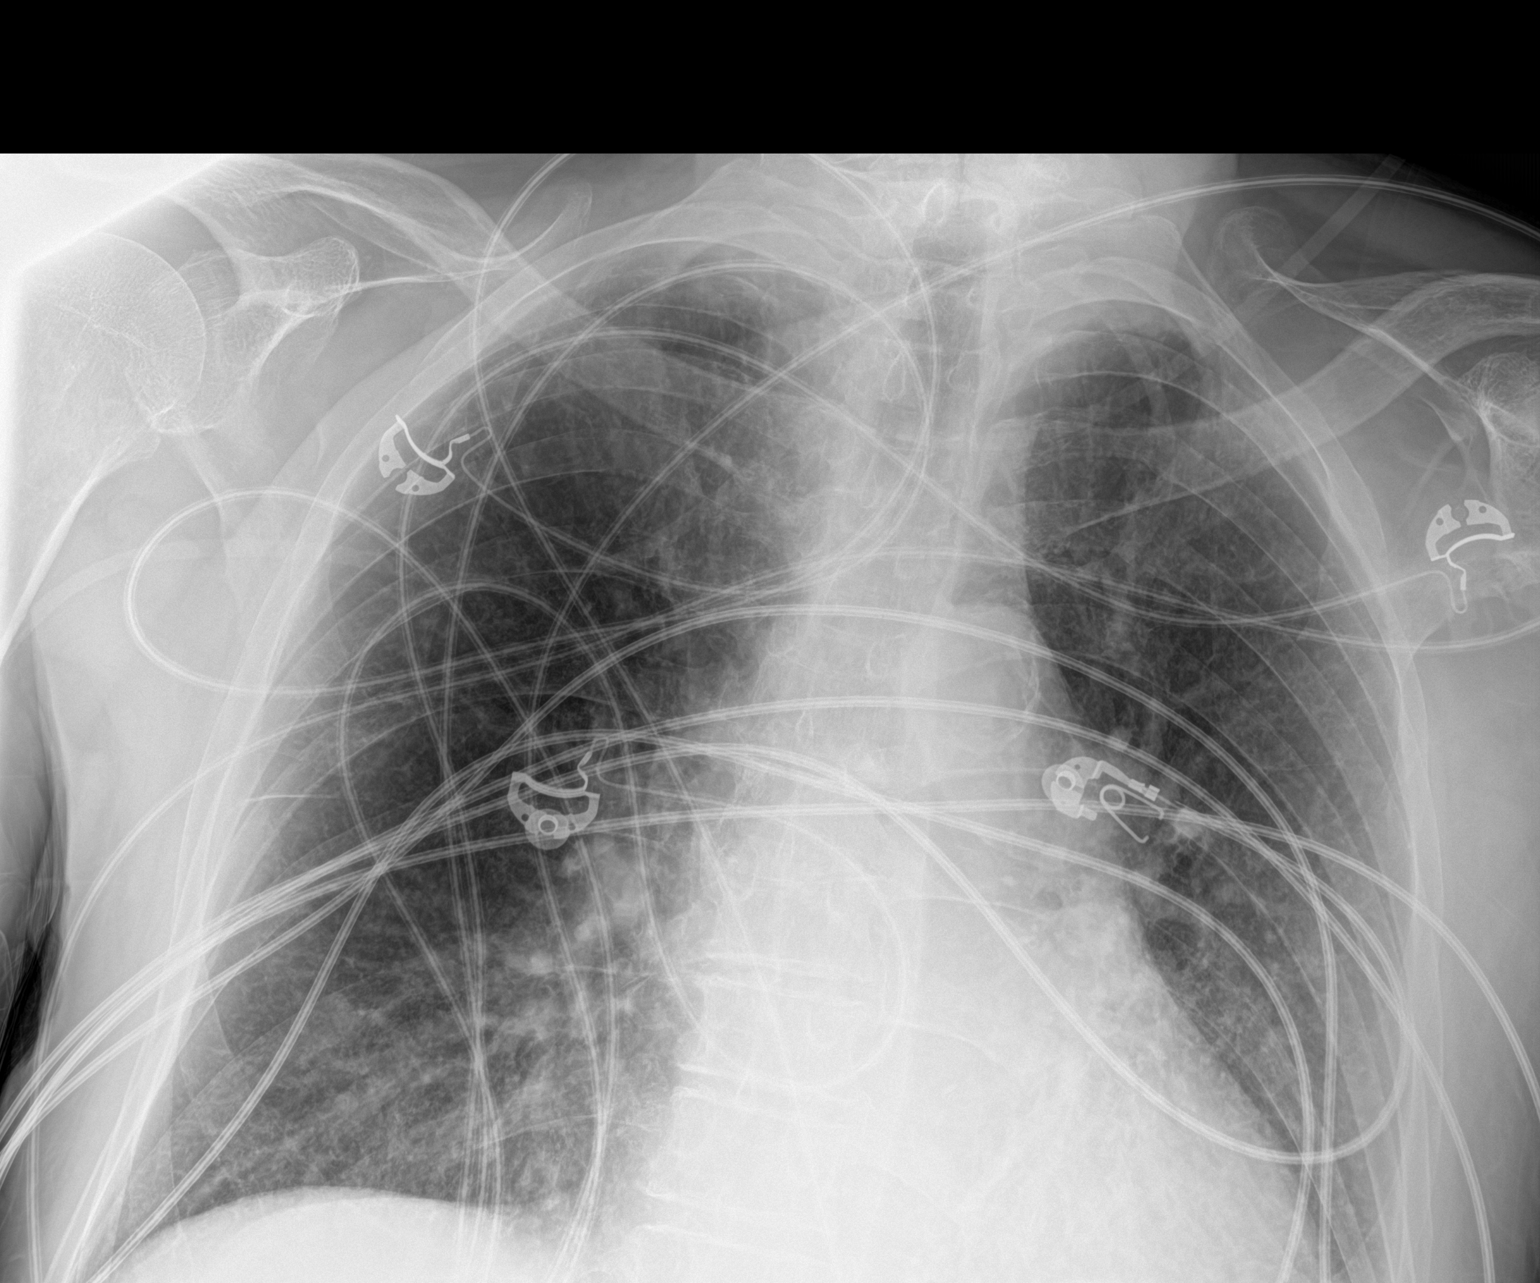

[1 of 1 positions shown; findings below may reference images not displayed]

FINDINGS: LEFT greater than RIGHT lung bases out of field-of-view. The cardiac
silhouette is mildly enlarged and unchanged. Calcified aortic knob.
Diffuse interstitial prominence confluent in the lung bases is
similar. Limited assessment for pleural effusion. Biapical pleural
thickening. No pneumothorax. Soft tissue planes and included osseous
structures are nonsuspicious.
IMPRESSION: Lung bases out of field-of-view, cannot assess known LEFT lower lobe
pneumonia.

Similar chronic interstitial changes.

Aortic Atherosclerosis ([O9]-[O9]).

## 2017-11-20 MED ORDER — INSULIN ASPART 100 UNIT/ML ~~LOC~~ SOLN
0.0000 [IU] | SUBCUTANEOUS | Status: DC
  Administered 2017-11-20: 5 [IU] via SUBCUTANEOUS
  Administered 2017-11-20: 1 [IU] via SUBCUTANEOUS
  Filled 2017-11-20 (×2): qty 1

## 2017-11-20 MED ORDER — ACETAMINOPHEN 650 MG RE SUPP: 650.0000 mg | Freq: Four times a day (QID) | RECTAL | Status: DC | PRN

## 2017-11-20 MED ORDER — ONDANSETRON HCL 4 MG PO TABS: 4.0000 mg | ORAL_TABLET | Freq: Four times a day (QID) | ORAL | Status: DC | PRN

## 2017-11-20 MED ORDER — ENOXAPARIN SODIUM 40 MG/0.4ML ~~LOC~~ SOLN
40.0000 mg | Freq: Every day | SUBCUTANEOUS | Status: DC
  Administered 2017-11-21 – 2017-11-23 (×3): 40 mg via SUBCUTANEOUS
  Filled 2017-11-20 (×5): qty 0.4

## 2017-11-20 MED ORDER — SODIUM CHLORIDE 0.9 % IV BOLUS
1000.0000 mL | Freq: Once | INTRAVENOUS | Status: AC
  Administered 2017-11-20: 1000 mL via INTRAVENOUS

## 2017-11-20 MED ORDER — CEFTRIAXONE SODIUM 1 G IJ SOLR
1.0000 g | Freq: Every day | INTRAMUSCULAR | Status: DC
  Administered 2017-11-20 – 2017-11-21 (×2): 1 g via INTRAVENOUS
  Filled 2017-11-20 (×3): qty 10

## 2017-11-20 MED ORDER — METOPROLOL TARTRATE 5 MG/5ML IV SOLN: 2.5000 mg | Freq: Once | INTRAVENOUS | Status: DC

## 2017-11-20 MED ORDER — HYDROCORTISONE NA SUCCINATE PF 100 MG IJ SOLR
100.0000 mg | Freq: Once | INTRAMUSCULAR | Status: AC
  Administered 2017-11-20: 100 mg via INTRAVENOUS
  Filled 2017-11-20: qty 2

## 2017-11-20 MED ORDER — SODIUM CHLORIDE 0.9 % IV BOLUS (SEPSIS)
1000.0000 mL | Freq: Once | INTRAVENOUS | Status: AC
  Administered 2017-11-20: 1000 mL via INTRAVENOUS

## 2017-11-20 MED ORDER — INSULIN GLARGINE 100 UNIT/ML ~~LOC~~ SOLN
15.0000 [IU] | Freq: Every day | SUBCUTANEOUS | Status: DC
  Administered 2017-11-20 – 2017-11-23 (×4): 15 [IU] via SUBCUTANEOUS
  Filled 2017-11-20 (×5): qty 0.15

## 2017-11-20 MED ORDER — PIPERACILLIN-TAZOBACTAM 3.375 G IVPB 30 MIN
3.3750 g | Freq: Once | INTRAVENOUS | Status: AC
  Administered 2017-11-20: 3.375 g via INTRAVENOUS
  Filled 2017-11-20: qty 50

## 2017-11-20 MED ORDER — SODIUM CHLORIDE 0.9 % IV SOLN: 1250.0000 mg | INTRAVENOUS | Status: DC

## 2017-11-20 MED ORDER — SODIUM CHLORIDE 0.9 % IV SOLN
1500.0000 mg | Freq: Once | INTRAVENOUS | Status: AC
  Administered 2017-11-20: 1500 mg via INTRAVENOUS
  Filled 2017-11-20: qty 1500

## 2017-11-20 MED ORDER — BUDESONIDE 3 MG PO CPEP
9.0000 mg | ORAL_CAPSULE | Freq: Every day | ORAL | Status: DC
  Administered 2017-11-20 – 2017-11-24 (×5): 9 mg via ORAL
  Filled 2017-11-20 (×5): qty 3

## 2017-11-20 MED ORDER — SODIUM CHLORIDE 0.9 % IV SOLN
INTRAVENOUS | Status: DC
  Administered 2017-11-20 – 2017-11-23 (×5): via INTRAVENOUS

## 2017-11-20 MED ORDER — INSULIN GLARGINE 100 UNIT/ML ~~LOC~~ SOLN: 10.0000 [IU] | Freq: Every day | SUBCUTANEOUS | Status: DC

## 2017-11-20 MED ORDER — VANCOMYCIN HCL 10 G IV SOLR
1250.0000 mg | INTRAVENOUS | Status: DC
  Filled 2017-11-20: qty 1250

## 2017-11-20 MED ORDER — METRONIDAZOLE IN NACL 5-0.79 MG/ML-% IV SOLN
500.0000 mg | Freq: Three times a day (TID) | INTRAVENOUS | Status: DC
  Administered 2017-11-20 – 2017-11-21 (×4): 500 mg via INTRAVENOUS
  Filled 2017-11-20 (×5): qty 100

## 2017-11-20 MED ORDER — INSULIN ASPART 100 UNIT/ML ~~LOC~~ SOLN: 0.0000 [IU] | Freq: Every day | SUBCUTANEOUS | Status: DC

## 2017-11-20 MED ORDER — SODIUM CHLORIDE 0.9 % IV BOLUS (SEPSIS)
500.0000 mL | Freq: Once | INTRAVENOUS | Status: AC
  Administered 2017-11-20: 500 mL via INTRAVENOUS

## 2017-11-20 MED ORDER — TAMSULOSIN HCL 0.4 MG PO CAPS
0.4000 mg | ORAL_CAPSULE | Freq: Every day | ORAL | Status: DC
  Administered 2017-11-21 – 2017-11-24 (×4): 0.4 mg via ORAL
  Filled 2017-11-20 (×4): qty 1

## 2017-11-20 MED ORDER — KETOROLAC TROMETHAMINE 15 MG/ML IJ SOLN
15.0000 mg | Freq: Once | INTRAMUSCULAR | Status: AC
  Administered 2017-11-20: 15 mg via INTRAVENOUS
  Filled 2017-11-20: qty 1

## 2017-11-20 MED ORDER — ONDANSETRON HCL 4 MG/2ML IJ SOLN: 4.0000 mg | Freq: Four times a day (QID) | INTRAMUSCULAR | Status: DC | PRN

## 2017-11-20 MED ORDER — INSULIN ASPART 100 UNIT/ML ~~LOC~~ SOLN
0.0000 [IU] | Freq: Three times a day (TID) | SUBCUTANEOUS | Status: DC
  Administered 2017-11-20: 2 [IU] via SUBCUTANEOUS
  Administered 2017-11-21 – 2017-11-23 (×6): 3 [IU] via SUBCUTANEOUS
  Administered 2017-11-23 – 2017-11-24 (×2): 2 [IU] via SUBCUTANEOUS
  Filled 2017-11-20: qty 1

## 2017-11-20 MED ORDER — VANCOMYCIN HCL IN DEXTROSE 1-5 GM/200ML-% IV SOLN: 1000.0000 mg | Freq: Once | INTRAVENOUS | Status: DC

## 2017-11-20 MED ORDER — ACETAMINOPHEN 325 MG PO TABS: 650.0000 mg | ORAL_TABLET | Freq: Four times a day (QID) | ORAL | Status: DC | PRN

## 2017-11-20 MED ORDER — ACETAMINOPHEN 650 MG RE SUPP
650.0000 mg | Freq: Once | RECTAL | Status: AC
  Administered 2017-11-20: 650 mg via RECTAL

## 2017-11-20 NOTE — ED Provider Notes (Signed)
[facility] EMERGENCY DEPARTMENT Provider Note   CSN: [id number] Arrival date & time: 11/20/17  0154     History   Chief Complaint Chief Complaint  Patient presents with  . Cough    HPI Tommy Peterson is a 77 y.o. male.  The history is provided by the EMS personnel and the patient. The history is limited by the condition of the patient.  Cough  This is a new problem. The current episode started more than 1 week ago. The problem has not changed since onset.The maximum temperature recorded prior to his arrival was more than 104 F. The fever has been present for less than 1 day. Associated symptoms include chills. Pertinent negatives include no chest pain. Associated symptoms comments: diarrhea. Treatments tried: antibiotics.   The treatment provided no relief. His past medical history is significant for pneumonia.  Diarrhea   This is a new problem. The problem occurs 5 to 10 times per day. The problem has not changed since onset.The stool consistency is described as watery (malodorous). Associated symptoms include vomiting, chills and cough. Pertinent negatives include no abdominal pain. He has tried nothing for the symptoms. The treatment provided no relief. Risk factors: recent antibotics. His past medical history does not include irritable bowel syndrome.    Past Medical History:  Diagnosis Date  . Anemia   . Cataract   . Diabetes mellitus without complication ([place])   . Hypertension   . Leukemia, lymphocytic ([place])   . Macrocytosis   . Monoclonal gammopathy     Patient Active Problem List   Diagnosis Date Noted  . Leukemia not having achieved remission ([place]) 12/26/2016  . Anemia 12/26/2016  . 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 Diabetes mellitus with complication ([place])   . Symptomatic anemia   . DOE (dyspnea on exertion)   . Anxiety   . History of GI bleed   . C. difficile colitis 05/31/2016  . Lactic acidosis   . History of bone  marrow biopsy 05/27/2016  . Fever 05/27/2016  . Encephalopathy in sepsis 05/27/2016  . Sepsis ([place]) 05/27/2016  . Chest pain 05/04/2016  . Benign fibroma of prostate 09/20/2015  . HLD (hyperlipidemia) 09/20/2015  . BP (high blood pressure) 09/20/2015  . Arthritis, degenerative 09/20/2015  . Colitis, collagenous 09/09/2015  . Absolute anemia 09/08/2014  . Adenoma of large intestine 07/13/2014  . Disturbance of skin sensation 01/09/2014  . Diabetes ([place]) 06/24/2013  . Recurrent major depressive disorder, in full remission ([place]) 07/18/2012  . Allergic rhinitis 08/29/2011    Past Surgical History:  Procedure Laterality Date  . BONE MARROW BIOPSY Right 2016  . CATARACT EXTRACTION     bilateral  . HERNIA REPAIR     right inguinal  . PENILE PROSTHESIS IMPLANT    . PROSTATE SURGERY     BPH  . TONSILLECTOMY          Home Medications    Prior to Admission medications   Medication Sig Start Date End Date Taking? Authorizing Provider  cefdinir (OMNICEF) 300 MG capsule Take 1 capsule (300 mg total) by mouth 2 (two) times daily. 11/08/17   Isla Pence, MD  Insulin Glargine-Lixisenatide (SOLIQUA) 100-33 UNT-MCG/ML SOPN Inject 28 Units into the skin at bedtime.     [provider]  levofloxacin (LEVAQUIN) 500 MG tablet Take 1 tablet (500 mg total) by mouth daily. Patient not taking: Reported on 11/08/2017 10/29/17   Wendie Agreste, MD  methotrexate (RHEUMATREX) 2.5 MG tablet  Take 30 mg/m2 by mouth once a week.  07/19/17   [provider]  oxybutynin (DITROPAN) 5 MG tablet Take 5 mg by mouth 3 (three) times daily. 10/22/17   [provider]  pioglitazone (ACTOS) 15 MG tablet Take 15 mg by mouth daily. 10/18/17   [provider]  tamsulosin (FLOMAX) 0.4 MG CAPS capsule Take 0.4 mg by mouth daily. 10/18/17   [provider]  triamcinolone cream (KENALOG) 0.1 % Apply 1 application topically 2 (two) times daily. Patient taking differently: Apply 1  application topically 2 (two) times daily as needed (for basel cell spots on forehead). Pt applies for ten days, then stops and repeats 02/14/16   Jaynee Eagles, PA-C    Family History Family History  Problem Relation Age of Onset  . Diabetes Mother   . Heart failure Mother     Social History Social History   Tobacco Use  . Smoking status: Former Smoker    Types: Cigarettes    Last attempt to quit: 05/04/1996    Years since quitting: 21.5  . Smokeless tobacco: Never Used  . Tobacco comment: Smoked for 20 years  Substance Use Topics  . Alcohol use: Yes    Comment: 1-2 glasses of wine every other day  . Drug use: No     Allergies   Bee venom; Metformin and related; and Sulfamethoxazole-trimethoprim   Review of Systems Review of Systems  Unable to perform ROS: Acuity of condition  Constitutional: Positive for chills and fever.  Respiratory: Positive for cough.   Cardiovascular: Negative for chest pain.  Gastrointestinal: Positive for diarrhea and vomiting. Negative for abdominal pain.     Physical Exam Updated Vital Signs BP 112/60   Pulse (!) 116   Temp (!) 104.7 F (40.4 C) (Rectal)   Resp (!) 35   Ht '5\' 9"'$  (1.753 m)   Wt 77.1 kg (170 lb)   SpO2 93%   BMI 25.10 kg/m   Physical Exam  Constitutional: He appears well-developed and well-nourished. He appears distressed.  HENT:  Head: Normocephalic and atraumatic.  Nose: Nose normal.  Mouth/Throat: No oropharyngeal exudate.  Eyes: Pupils are equal, round, and reactive to light. Conjunctivae are normal.  Neck: Normal range of motion. Neck supple.  Cardiovascular: Regular rhythm. Tachycardia present.  Pulmonary/Chest: Tachypnea noted. He has rales.  Abdominal: Soft. Bowel sounds are normal. He exhibits no mass. There is no tenderness. There is no rebound and no guarding.  Musculoskeletal: Normal range of motion. He exhibits no edema, tenderness or deformity.  Lymphadenopathy:    He has no cervical adenopathy.    Neurological: He is alert. He displays normal reflexes.  Skin: Skin is warm. Capillary refill takes less than 2 seconds.  Psychiatric: He has a normal mood and affect.  Nursing note and vitals reviewed.    ED Treatments / Results  Labs (all labs ordered are listed, but only abnormal results are displayed) Results for orders placed or performed during the hospital encounter of 11/20/17  Comprehensive metabolic panel  Result Value Ref Range   Sodium 136 135 - 145 mmol/L   Potassium 4.3 3.5 - 5.1 mmol/L   Chloride 104 101 - 111 mmol/L   CO2 21 (L) 22 - 32 mmol/L   Glucose, Bld 329 (H) 65 - 99 mg/dL   BUN 11 6 - 20 mg/dL   Creatinine, Ser 0.87 0.61 - 1.24 mg/dL   Calcium 9.0 8.9 - 10.3 mg/dL   Total Protein 6.2 (L) 6.5 - 8.1  g/dL   Albumin 3.4 (L) 3.5 - 5.0 g/dL   AST 21 15 - 41 U/L   ALT 20 17 - 63 U/L   Alkaline Phosphatase 73 38 - 126 U/L   Total Bilirubin 1.1 0.3 - 1.2 mg/dL   GFR calc non Af Amer >60 >60 mL/min   GFR calc Af Amer >60 >60 mL/min   Anion gap 11 5 - 15  CBC WITH DIFFERENTIAL  Result Value Ref Range   WBC 17.5 (H) 4.0 - 10.5 K/uL   RBC 2.58 (L) 4.22 - 5.81 MIL/uL   Hemoglobin 9.7 (L) 13.0 - 17.0 g/dL   HCT 29.6 (L) 39.0 - 52.0 %   MCV 114.7 (H) 78.0 - 100.0 fL   MCH 37.6 (H) 26.0 - 34.0 pg   MCHC 32.8 30.0 - 36.0 g/dL   RDW 14.9 11.5 - 15.5 %   Platelets 420 (H) 150 - 400 K/uL   Neutrophils Relative % 88 %   Lymphocytes Relative 6 %   Monocytes Relative 5 %   Eosinophils Relative 1 %   Basophils Relative 0 %   Neutro Abs 15.3 (H) 1.7 - 7.7 K/uL   Lymphs Abs 1.1 0.7 - 4.0 K/uL   Monocytes Absolute 0.9 0.1 - 1.0 K/uL   Eosinophils Absolute 0.2 0.0 - 0.7 K/uL   Basophils Absolute 0.0 0.0 - 0.1 K/uL   Smear Review MORPHOLOGY UNREMARKABLE   Urinalysis, Routine w reflex microscopic  Result Value Ref Range   Color, Urine YELLOW YELLOW   APPearance CLEAR CLEAR   Specific Gravity, Urine 1.011 1.005 - 1.030   pH 7.0 5.0 - 8.0   Glucose, UA >=500 (A)  NEGATIVE mg/dL   Hgb urine dipstick NEGATIVE NEGATIVE   Bilirubin Urine NEGATIVE NEGATIVE   Ketones, ur 5 (A) NEGATIVE mg/dL   Protein, ur NEGATIVE NEGATIVE mg/dL   Nitrite NEGATIVE NEGATIVE   Leukocytes, UA TRACE (A) NEGATIVE   RBC / HPF 0-5 0 - 5 RBC/hpf   WBC, UA 6-30 0 - 5 WBC/hpf   Bacteria, UA RARE (A) NONE SEEN   Squamous Epithelial / LPF 0-5 (A) NONE SEEN   Mucus PRESENT    Hyaline Casts, UA PRESENT   I-Stat CG4 Lactic Acid, ED  (not at  [facility])  Result Value Ref Range   Lactic Acid, Venous 3.13 (HH) 0.5 - 1.9 mmol/L   Comment NOTIFIED PHYSICIAN   I-Stat CG4 Lactic Acid, ED  (not at  [facility])  Result Value Ref Range   Lactic Acid, Venous 1.26 0.5 - 1.9 mmol/L   Dg Chest 2 View  Result Date: 11/08/2017 CLINICAL DATA:  Cough. EXAM: CHEST - 2 VIEW COMPARISON:  05/02/2017 FINDINGS: The cardiomediastinal silhouette is unchanged. The heart is normal in size. Aortic atherosclerosis is noted. The patient has taken a slightly shallower inspiration than on the prior study. There is chronic peribronchial thickening and interstitial coarsening which are stable to mildly increased from the prior study. New patchy airspace opacity is present in the left lung base. There is mild biapical scarring. Trace pleural effusions are questioned bilaterally. No pneumothorax is identified. Old left rib fractures are noted. IMPRESSION: Chronic interstitial changes with new patchy left basilar airspace opacity suspicious for pneumonia. Electronically Signed   By: Logan Bores M.D.   On: 11/08/2017 10:50   Ct Abdomen Pelvis W Contrast  Result Date: 11/08/2017 CLINICAL DATA:  Acute left lower quadrant abdominal pain. EXAM: CT ABDOMEN AND PELVIS WITH CONTRAST TECHNIQUE: Multidetector CT imaging of the  abdomen and pelvis was performed using the standard protocol following bolus administration of intravenous contrast. CONTRAST:  131m ISOVUE-300 IOPAMIDOL (ISOVUE-300) INJECTION 61% COMPARISON:  CT scan of August 01, 2015. FINDINGS: Lower chest: Mild left basilar subsegmental atelectasis or infiltrate is noted. Hepatobiliary: No focal liver abnormality is seen. No gallstones, gallbladder wall thickening, or biliary dilatation. Pancreas: Unremarkable. No pancreatic ductal dilatation or surrounding inflammatory changes. Spleen: Normal in size without focal abnormality. Adrenals/Urinary Tract: Adrenal glands are unremarkable. Kidneys are normal, without renal calculi, focal lesion, or hydronephrosis. Bladder is unremarkable. Stomach/Bowel: The stomach appears normal. The appendix is unremarkable. There is no evidence of bowel obstruction. There is no evidence of inflammation. Vascular/Lymphatic: Aortic atherosclerosis. No enlarged abdominal or pelvic lymph nodes. Reproductive: Stable mild prostatic enlargement is noted with associated calcifications. Other: No abdominal wall hernia or abnormality. No abdominopelvic ascites. Penile prosthesis reservoir is noted in left lower quadrant. Musculoskeletal: No acute or significant osseous findings. IMPRESSION: Mild left basilar subsegmental atelectasis or infiltrate. Stable mild prostatic enlargement. No acute abnormality seen in the abdomen or pelvis. Aortic Atherosclerosis (ICD10-I70.0). Electronically Signed   By: JMarijo Conception M.D.   On: 11/08/2017 12:41   Dg Chest Portable 1 View  Result Date: 11/20/2017 CLINICAL DATA:  Cough, nausea, vomiting and diarrhea. EXAM: PORTABLE CHEST 1 VIEW COMPARISON:  Chest radiograph Lonny Eisen 4, 2019 FINDINGS: LEFT greater than RIGHT lung bases out of field-of-view. The cardiac silhouette is mildly enlarged and unchanged. Calcified aortic knob. Diffuse interstitial prominence confluent in the lung bases is similar. Limited assessment for pleural effusion. Biapical pleural thickening. No pneumothorax. Soft tissue planes and included osseous structures are nonsuspicious. IMPRESSION: Lung bases out of field-of-view, cannot assess known LEFT lower lobe  pneumonia. Similar chronic interstitial changes. Aortic Atherosclerosis (ICD10-I70.0). Electronically Signed   By: CElon AlasM.D.   On: 11/20/2017 03:29    EKG EKG Interpretation  Date/Time:  Tuesday Dawnya Grams 16 2019 03:28:47 EDT Ventricular Rate:  125 PR Interval:    QRS Duration: 113 QT Interval:  272 QTC Calculation: 393 R Axis:   -52 Text Interpretation:  Sinus or ectopic atrial tachycardia Ventricular premature complex Incomplete left bundle branch block Confirmed by PRandal Buba Reese Stockman ([id number]) on 11/20/2017 3:41:11 AM   Radiology Dg Chest Portable 1 View  Result Date: 11/20/2017 CLINICAL DATA:  Cough, nausea, vomiting and diarrhea. EXAM: PORTABLE CHEST 1 VIEW COMPARISON:  Chest radiograph Mikiyah Glasner 4, 2019 FINDINGS: LEFT greater than RIGHT lung bases out of field-of-view. The cardiac silhouette is mildly enlarged and unchanged. Calcified aortic knob. Diffuse interstitial prominence confluent in the lung bases is similar. Limited assessment for pleural effusion. Biapical pleural thickening. No pneumothorax. Soft tissue planes and included osseous structures are nonsuspicious. IMPRESSION: Lung bases out of field-of-view, cannot assess known LEFT lower lobe pneumonia. Similar chronic interstitial changes. Aortic Atherosclerosis (ICD10-I70.0). Electronically Signed   By: CElon AlasM.D.   On: 11/20/2017 03:29    Procedures Procedures (including critical care time)  Medications Ordered in ED Medications  vancomycin (VANCOCIN) 1,500 mg in sodium chloride 0.9 % 500 mL IVPB (1,500 mg Intravenous New Bag/Given 11/20/17 0315)  sodium chloride 0.9 % bolus 1,000 mL (1,000 mLs Intravenous New Bag/Given 11/20/17 0210)    And  sodium chloride 0.9 % bolus 1,000 mL (0 mLs Intravenous Stopped 11/20/17 0339)    And  sodium chloride 0.9 % bolus 500 mL (500 mLs Intravenous New Bag/Given 11/20/17 0342)  piperacillin-tazobactam (ZOSYN) IVPB 3.375 g (0 g Intravenous Stopped 11/20/17 0339)  acetaminophen  (  TYLENOL) suppository 650 mg (650 mg Rectal Given 11/20/17 0230)     Case d/w Dr. Elsworth Soho, please admit to medicine based on vitals is stable.  Will consult if worsening condition  MDM Reviewed: previous chart, nursing note and vitals Reviewed previous: labs Interpretation: labs, ECG and x-ray (elevated white count and lactic acid  PNA by me on CXR) Total time providing critical care: 30-74 minutes (sepsis bundle with IVF and antibiotics). This excludes time spent performing separately reportable procedures and services. Consults: admitting MD and critical care  CRITICAL CARE Performed by: Carlisle Beers Total critical care time: 61 minutes Critical care time was exclusive of separately billable procedures and treating other patients. Critical care was necessary to treat or prevent imminent or life-threatening deterioration. Critical care was time spent personally by me on the following activities: development of treatment plan with patient and/or surrogate as well as nursing, discussions with consultants, evaluation of patient's response to treatment, examination of patient, obtaining history from patient or surrogate, ordering and performing treatments and interventions, ordering and review of laboratory studies, ordering and review of radiographic studies, pulse oximetry and re-evaluation of patient's condition.  Final Clinical Impressions(s) / ED Diagnoses   Final diagnoses:  Severe sepsis ([place])  Diarrhea, unspecified type   Admit to medicine    Zayan Delvecchio, MD 11/20/17 [phone]

## 2017-11-20 NOTE — ED Notes (Signed)
Blood cultures collected, delay in antibiotics d/t delay in collection of cultures.

## 2017-11-20 NOTE — ED Triage Notes (Signed)
Pt arrived via EMS from [facility] with c/o n/v/d, cough. Per EMS pt very warm to tough, dx with PNA, +flu. 11/08/17.  Per EMS pt vomited x 10, malodorous bowel incontinence.  Pt has been on multiple antibiotics recently.

## 2017-11-20 NOTE — H&P (Signed)
History and Physical    Tommy Peterson EPP:[id number] DOB: 1940/08/10 DOA: 11/20/2017  PCP: Jani Gravel, MD  Patient coming from: Home  I have personally briefly reviewed patient's old medical records in [place]  Chief Complaint: Cough, N/V/D  HPI: Tommy Peterson is a 77 y.o. male with medical history significant of LGL leukemia on MTX therapy.  Patient presents to ED with cough, fever, N/V/D.  Cough has been ongoing for the past couple of weeks.  Has been on multiple ABx recently for this, most recently a course of omnicef this past week started on 4/4 after imaging showed LLL PNA.  Yesterday developed diarrhea, and up to 10 episodes of vomiting.   ED Course: WBC 17.x.  Tm 104.x.  Lactate 3.1, clears to 1.2 after 2.5L NS bolus.  Put on empiric zosyn and vanc in ED.  Stool sent for C.Diff.  CXR cut off the lung bases so the known LLL PNA couldn't be seen on today's study.   Review of Systems: As per HPI otherwise 10 point review of systems negative.   Past Medical History:  Diagnosis Date  . Anemia   . Cataract   . Diabetes mellitus without complication ([place])   . Hypertension   . Leukemia, lymphocytic ([place])   . Macrocytosis   . Monoclonal gammopathy     Past Surgical History:  Procedure Laterality Date  . BONE MARROW BIOPSY Right 2016  . CATARACT EXTRACTION     bilateral  . HERNIA REPAIR     right inguinal  . PENILE PROSTHESIS IMPLANT    . PROSTATE SURGERY     BPH  . TONSILLECTOMY       reports that he quit smoking about 21 years ago. His smoking use included cigarettes. He has never used smokeless tobacco. He reports that he drinks alcohol. He reports that he does not use drugs.  Allergies  Allergen Reactions  . Bee Venom Swelling  . Metformin And Related Nausea And Vomiting  . Sulfamethoxazole-Trimethoprim Nausea And Vomiting    Family History  Problem Relation Age of Onset  . Diabetes Mother   . Heart failure Mother      Prior to Admission  medications   Medication Sig Start Date End Date Taking? Authorizing Provider  cefdinir (OMNICEF) 300 MG capsule Take 1 capsule (300 mg total) by mouth 2 (two) times daily. 11/08/17   Isla Pence, MD  Insulin Glargine-Lixisenatide (SOLIQUA) 100-33 UNT-MCG/ML SOPN Inject 28 Units into the skin at bedtime.     [provider]  levofloxacin (LEVAQUIN) 500 MG tablet Take 1 tablet (500 mg total) by mouth daily. Patient not taking: Reported on 11/08/2017 10/29/17   Wendie Agreste, MD  methotrexate (RHEUMATREX) 2.5 MG tablet Take 30 mg/m2 by mouth once a week.  07/19/17   [provider]  oxybutynin (DITROPAN) 5 MG tablet Take 5 mg by mouth 3 (three) times daily. 10/22/17   [provider]  pioglitazone (ACTOS) 15 MG tablet Take 15 mg by mouth daily. 10/18/17   [provider]  tamsulosin (FLOMAX) 0.4 MG CAPS capsule Take 0.4 mg by mouth daily. 10/18/17   [provider]  triamcinolone cream (KENALOG) 0.1 % Apply 1 application topically 2 (two) times daily. Patient taking differently: Apply 1 application topically 2 (two) times daily as needed (for basel cell spots on forehead). Pt applies for ten days, then stops and repeats 02/14/16   Jaynee Eagles, [place]    Physical Exam: Vitals:   11/20/17 0530 11/20/17  0545 11/20/17 0609 11/20/17 0615  BP: (!) 111/49 (!) 108/50  (!) 101/36  Pulse: (!) 115 (!) 110  (!) 102  Resp: (!) 23 (!) 26  (!) 27  Temp:   (!) 102 F (38.9 C)   TempSrc:      SpO2: 93% 96%  97%  Weight:      Height:        Constitutional: very ill appearing Eyes: PERRL, lids and conjunctivae normal ENMT: Mucous membranes are moist. Posterior pharynx clear of any exudate or lesions.Normal dentition.  Neck: normal, supple, no masses, no thyromegaly Respiratory: Tachypnic, rales in base Cardiovascular: Tachycardic, regular Abdomen: no tenderness, no masses palpated. No hepatosplenomegaly. Bowel sounds positive.  Musculoskeletal: no clubbing /  cyanosis. No joint deformity upper and lower extremities. Good ROM, no contractures. Normal muscle tone.  Skin: no rashes, lesions, ulcers. No induration Neurologic: CN 2-12 grossly intact. Sensation intact, DTR normal. Strength 5/5 in all 4.  Psychiatric: Normal judgment and insight. Alert and oriented x 3. Normal mood.    Labs on Admission: I have personally reviewed following labs and imaging studies  CBC: Recent Labs  Lab 11/20/17 0259  WBC 17.5*  NEUTROABS 15.3*  HGB 9.7*  HCT 29.6*  MCV 114.7*  PLT 035*   Basic Metabolic Panel: Recent Labs  Lab 11/20/17 0259  NA 136  K 4.3  CL 104  CO2 21*  GLUCOSE 329*  BUN 11  CREATININE 0.87  CALCIUM 9.0   GFR: Estimated Creatinine Clearance: 71.1 mL/min (by C-G formula based on SCr of 0.87 mg/dL). Liver Function Tests: Recent Labs  Lab 11/20/17 0259  AST 21  ALT 20  ALKPHOS 73  BILITOT 1.1  PROT 6.2*  ALBUMIN 3.4*   No results for input(s): LIPASE, AMYLASE in the last 168 hours. No results for input(s): AMMONIA in the last 168 hours. Coagulation Profile: No results for input(s): INR, PROTIME in the last 168 hours. Cardiac Enzymes: No results for input(s): CKTOTAL, CKMB, CKMBINDEX, TROPONINI in the last 168 hours. BNP (last 3 results) No results for input(s): PROBNP in the last 8760 hours. HbA1C: No results for input(s): HGBA1C in the last 72 hours. CBG: No results for input(s): GLUCAP in the last 168 hours. Lipid Profile: No results for input(s): CHOL, HDL, LDLCALC, TRIG, CHOLHDL, LDLDIRECT in the last 72 hours. Thyroid Function Tests: No results for input(s): TSH, T4TOTAL, FREET4, T3FREE, THYROIDAB in the last 72 hours. Anemia Panel: No results for input(s): VITAMINB12, FOLATE, FERRITIN, TIBC, IRON, RETICCTPCT in the last 72 hours. Urine analysis:    Component Value Date/Time   COLORURINE YELLOW 11/20/2017 0303   APPEARANCEUR CLEAR 11/20/2017 0303   LABSPEC 1.011 11/20/2017 0303   PHURINE 7.0 11/20/2017  0303   GLUCOSEU >=500 (A) 11/20/2017 0303   HGBUR NEGATIVE 11/20/2017 0303   BILIRUBINUR NEGATIVE 11/20/2017 0303   BILIRUBINUR negative 10/29/2017 1428   KETONESUR 5 (A) 11/20/2017 0303   PROTEINUR NEGATIVE 11/20/2017 0303   UROBILINOGEN 0.2 10/29/2017 1428   NITRITE NEGATIVE 11/20/2017 0303   LEUKOCYTESUR TRACE (A) 11/20/2017 0303    Radiological Exams on Admission: Dg Chest Portable 1 View  Result Date: 11/20/2017 CLINICAL DATA:  Cough, nausea, vomiting and diarrhea. EXAM: PORTABLE CHEST 1 VIEW COMPARISON:  Chest radiograph November 08, 2017 FINDINGS: LEFT greater than RIGHT lung bases out of field-of-view. The cardiac silhouette is mildly enlarged and unchanged. Calcified aortic knob. Diffuse interstitial prominence confluent in the lung bases is similar. Limited assessment for pleural effusion. Biapical pleural thickening. No  pneumothorax. Soft tissue planes and included osseous structures are nonsuspicious. IMPRESSION: Lung bases out of field-of-view, cannot assess known LEFT lower lobe pneumonia. Similar chronic interstitial changes. Aortic Atherosclerosis (ICD10-I70.0). Electronically Signed   By: Elon Alas M.D.   On: 11/20/2017 03:29    EKG: Independently reviewed.  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 Sepsis - possible sources include LLL PNA vs infectious gastroenteritis / colitis 1. Got zosyn and vanc in ED 2. Will put patient on rocephin / flagyl / vanc for now 3. BCx pending 4. C.Diff is surprisingly negative. 5. IVF: 2.5L bolus then 125 cc/hr 6. Will order GI pathogen panel, maybe he has norovirus which has been going around town recently. 2. DM2 - 1. Lantus 10 QHS 2. Sensitive SSI q4h 3. LGL - 1. Holding MTX in setting of severe sepsis  DVT prophylaxis: Lovenox Code Status: Full Family Communication: Wife at  bedside Disposition Plan: Home after admit Consults called: None Admission status: Admit to inpatient   [place], [place] [facility] Pager [phone]  If 7AM-7PM, please contact day team taking care of patient [email or web address] Password [facility]  11/20/2017, 6:43 AM

## 2017-11-20 NOTE — Progress Notes (Signed)
Pharmacy Antibiotic Note  Tommy Peterson is a 77 y.o. male admitted on 11/20/2017 with PNA/gasterenteritis/sepsis.  Pharmacy has been consulted for Vancomycin  Dosing.  Vancomycin 1500 mg IV given in ED at [facility]: Vancomycin 1250 mg IV q24h  Height: 5\' 9"  (175.3 cm) Weight: 170 lb (77.1 kg) IBW/kg (Calculated) : 70.7  Temp (24hrs), Avg:103.4 F (39.7 C), Min:102 F (38.9 C), Max:104.7 F (40.4 C)  Recent Labs  Lab 11/20/17 0259 11/20/17 0304 11/20/17 0437  WBC 17.5*  --   --   CREATININE 0.87  --   --   LATICACIDVEN  --  3.13* 1.26    Estimated Creatinine Clearance: 71.1 mL/min (by C-G formula based on SCr of 0.87 mg/dL).    Allergies  Allergen Reactions  . Bee Venom Swelling  . Metformin And Related Nausea And Vomiting  . Sulfamethoxazole-Trimethoprim Nausea And Vomiting    Tommy Peterson 11/20/2017 7:06 AM

## 2017-11-20 NOTE — ED Notes (Signed)
Applesauce and water given to patient.

## 2017-11-20 NOTE — Progress Notes (Signed)
Inpatient Diabetes Program Recommendations  AACE/ADA: New Consensus Statement on Inpatient Glycemic Control (2015)  Target Ranges:  Prepandial:   less than 140 mg/dL      Peak postprandial:   less than 180 mg/dL (1-2 hours)      Critically ill patients:  140 - 180 mg/dL   Lab Results  Component Value Date   GLUCAP 270 (H) 11/20/2017   HGBA1C 8.7 (H) 12/27/2016    Review of Glycemic Control  Diabetes history: DM2 Outpatient Diabetes medications: Soliqua 28 units qd + Actos 15 Current orders for Inpatient glycemic control: Lantus 10 units + Novolog sensitive correction q 4 hrs.  Inpatient Diabetes Program Recommendations:   -Increase Lantus to 15 units daily.  Patient on [place] @ home (includes 28 units Lantus+ GLP1 Lixisenatide)  A1c 8.3 on 10/25/17 @ [facility]. Will follow patient during hospitalization.  Thank you, Nani Gasser. Lotta Frankenfield, RN, MSN, CDE  Diabetes Coordinator Inpatient Glycemic Control Team Team Pager [phone] (8am-5pm) 11/20/2017 9:14 AM

## 2017-11-20 NOTE — Progress Notes (Signed)
[facility] Progress Note  Patient: Tommy Peterson XNA:[id number]   PCP: Jani Gravel, MD DOB: 1940-09-02   DOA: 11/20/2017   DOS: 11/20/2017   Date of Service: the patient was seen and examined on 11/20/2017  Subjective: Patient is feeling better, nausea is resolved.  No further vomiting.  No episodes of diarrhea since arrival in the hospital as well.  Mild abdominal soreness which is diffuse.  No chest pain or shortness of breath.  Feels fatigued and tired. He reports that he stopped taking Entocort 1 week ago prior to arrival.  Brief hospital course: Pt. with PMH of Chronic large granular lymphocytic leukemia, diabetes for 25+ years, hypertension, hyperlipidemia, colonic adenoma, chronic diarrhea since at least 2010 (per patient), active colitis on colon biopsies at the time (last colonoscopy on 05/20/2014 colon appeared normal) on budenoside 6 mg/day, history of C. difficile diarrhea fall 2017 s/p PO vancomycin, s/p prostate surgery in 2009, MGUS, transfusion dependent anemia; admitted on 11/20/2017, presented with complaint of nausea vomiting and diarrhea, was found to have sepsis. Currently further plan is continue current antibiotics.  Assessment and Plan: 1.  Sepsis with shock acute intra-abdominal infection. Most likely viral gastroenteritis. Nausea vomiting and diarrhea. Recent use of Cipro and Levaquin for UTI and pneumonia respectively. T-max 104.7.  Tachycardic tachypneic on admission. Hypotensive now. Received 2.5 L of normal saline bolus. Continued on 125 cc normal saline. Blood pressure in 80s again, will provide 1 more liter of bolus and continue to monitor. Blood pressure refractory, patient is actually on chronic steroids at home, will provide IV Solu-Cortef 100 x 1. C. difficile PCR performed which was negative. Lactic acid improving. Influenza panel negative as well. HIV negative. GI pathogen panel currently pending. UA shows evidence of pyuria, urine culture  ordered. Blood culture currently pending. Does not have any respiratory symptoms. We will get abdominal series x-ray. For now since patient's vomiting as well as diarrhea has resolved and abdominal pain is minimal but advanced the patient to soft diet. Continue IV vancomycin, ceftriaxone, Flagyl for empiric coverage for now.  2.  Chronic collagenous colitis, with chronic diarrhea. Noncompliance with Entocort. Patient mentions that when he does not take budesonide he has diarrhea and he has not taken it for last 1 week. Most likely this is the etiology along with the possibility of a viral gastroenteritis for patient's presentation. Will resume budesonide.  3.  Type 2 diabetes mellitus. Long-term insulin dependence. Patient is on combination Lantus at home. Home regimen is 28 units along with GLP-1.  Due to poor p.o. intake patient was started on 10 units of Lantus, I will increase to 15 units for now. Continue sliding scale insulin moderate. Changing from every 4 hours to be CHS. Check hemoglobin A1c.  4.  MGUS, chronic granular lymphocytic leukemia. Patient on methotrexate, currently will be on hold.  5.  History of BPH with urinary urgency. Patient is on both Flomax as well as oxybutynin(Given recently by urology for symptoms of frequency). At present I will discontinue oxybutynin continue Flomax for the patient right now. Monitor blood pressure.  Diet: Soft diet carb modified DVT Prophylaxis: subcutaneous Heparin  Advance goals of care discussion: full code  Family Communication: no family was present at bedside, at the time of interview.   Disposition:  Discharge to home.  Consultants: none Procedures: none  Antibiotics: Anti-infectives (From admission, onward)   Start     Dose/Rate Route Frequency Ordered Stop   11/21/17 0600  vancomycin (VANCOCIN) 1,250 mg in sodium  chloride 0.9 % 250 mL IVPB  Status:  Discontinued     1,250 mg 166.7 mL/hr over 90 Minutes  Intravenous Every 24 hours 11/20/17 0708 11/20/17 0751   11/20/17 1000  cefTRIAXone (ROCEPHIN) 1 g in sodium chloride 0.9 % 100 mL IVPB     1 g 200 mL/hr over 30 Minutes Intravenous Daily 11/20/17 0642     11/20/17 0800  metroNIDAZOLE (FLAGYL) IVPB 500 mg     500 mg 100 mL/hr over 60 Minutes Intravenous Every 8 hours 11/20/17 0642     11/20/17 0215  piperacillin-tazobactam (ZOSYN) IVPB 3.375 g     3.375 g 100 mL/hr over 30 Minutes Intravenous  Once 11/20/17 0209 11/20/17 0339   11/20/17 0215  vancomycin (VANCOCIN) IVPB 1000 mg/200 mL premix  Status:  Discontinued     1,000 mg 200 mL/hr over 60 Minutes Intravenous  Once 11/20/17 0209 11/20/17 0210   11/20/17 0215  vancomycin (VANCOCIN) 1,500 mg in sodium chloride 0.9 % 500 mL IVPB     1,500 mg 250 mL/hr over 120 Minutes Intravenous  Once 11/20/17 0210 11/20/17 0606       Objective: Physical Exam: Vitals:   11/20/17 1200 11/20/17 1230 11/20/17 1315 11/20/17 1330  BP: (!) 99/56 (!) 91/55 (!) 82/50 (!) 87/47  Pulse: 73 83 81 77  Resp: 16 17 18 20   Temp:      TempSrc:      SpO2: 98% 96% 92% 95%  Weight:      Height:       No intake or output data in the 24 hours ending 11/20/17 1342 Filed Weights   11/20/17 0331  Weight: 77.1 kg (170 lb)   General: Alert, Awake and Oriented to Time, Place and Person. Appear in moderate distress, affect appropriate Eyes: PERRL, Conjunctiva normal ENT: Oral Mucosa clear moist. Neck: no JVD, no Abnormal Mass Or lumps Cardiovascular: S1 and S2 Present, no Murmur, Peripheral Pulses Present Respiratory: normal respiratory effort, Bilateral Air entry equal and Decreased, no use of accessory muscle, Clear to Auscultation, no Crackles, no wheezes Abdomen: Bowel Sound present, Soft and mild diffuse tenderness, no hernia Skin: no redness, no Rash, no induration Extremities: no Pedal edema, no calf tenderness Neurologic: Grossly no focal neuro deficit. Bilaterally Equal motor strength  Data  Reviewed: CBC: Recent Labs  Lab 11/20/17 0259  WBC 17.5*  NEUTROABS 15.3*  HGB 9.7*  HCT 29.6*  MCV 114.7*  PLT 419*   Basic Metabolic Panel: Recent Labs  Lab 11/20/17 0259  NA 136  K 4.3  CL 104  CO2 21*  GLUCOSE 329*  BUN 11  CREATININE 0.87  CALCIUM 9.0    Liver Function Tests: Recent Labs  Lab 11/20/17 0259  AST 21  ALT 20  ALKPHOS 73  BILITOT 1.1  PROT 6.2*  ALBUMIN 3.4*   No results for input(s): LIPASE, AMYLASE in the last 168 hours. No results for input(s): AMMONIA in the last 168 hours. Coagulation Profile: No results for input(s): INR, PROTIME in the last 168 hours. Cardiac Enzymes: No results for input(s): CKTOTAL, CKMB, CKMBINDEX, TROPONINI in the last 168 hours. BNP (last 3 results) No results for input(s): PROBNP in the last 8760 hours. CBG: Recent Labs  Lab 11/20/17 0846 11/20/17 1201  GLUCAP 270* 141*   Studies: Dg Chest Portable 1 View  Result Date: 11/20/2017 CLINICAL DATA:  Cough, nausea, vomiting and diarrhea. EXAM: PORTABLE CHEST 1 VIEW COMPARISON:  Chest radiograph November 08, 2017 FINDINGS: LEFT greater than RIGHT lung  bases out of field-of-view. The cardiac silhouette is mildly enlarged and unchanged. Calcified aortic knob. Diffuse interstitial prominence confluent in the lung bases is similar. Limited assessment for pleural effusion. Biapical pleural thickening. No pneumothorax. Soft tissue planes and included osseous structures are nonsuspicious. IMPRESSION: Lung bases out of field-of-view, cannot assess known LEFT lower lobe pneumonia. Similar chronic interstitial changes. Aortic Atherosclerosis (ICD10-I70.0). Electronically Signed   By: Elon Alas M.D.   On: 11/20/2017 03:29    Scheduled Meds: . budesonide  9 mg Oral Daily  . enoxaparin (LOVENOX) injection  40 mg Subcutaneous Daily  . insulin aspart  0-15 Units Subcutaneous TID WC  . insulin aspart  0-5 Units Subcutaneous QHS  . insulin glargine  15 Units Subcutaneous QHS   . [START ON 11/21/2017] tamsulosin  0.4 mg Oral Daily   Continuous Infusions: . sodium chloride 125 mL/hr at 11/20/17 0654  . cefTRIAXone (ROCEPHIN)  IV Stopped (11/20/17 1041)  . metronidazole Stopped (11/20/17 6147)  . sodium chloride 1,000 mL (11/20/17 1334)   PRN Meds: acetaminophen **OR** acetaminophen, ondansetron **OR** ondansetron (ZOFRAN) IV  Time spent: 35 minutes, this is in addition to the 70 minutes spent on admission.  Time spent in eliciting history, ordering further workup and treatment based on new information available.  Author: Berle Mull, MD [facility] Pager: [phone] 11/20/2017 1:42 PM  If 7PM-7AM, please contact night-coverage at [email or web address], password [facility]

## 2017-11-20 NOTE — ED Notes (Signed)
This RN spoke with Sheran Luz DO with the hospitalist admission team who saw the patient last night.  Patient and family concerned about patient missing the methotrexate dose for tuesdays. Per DO we are to hold dose of methotrexate due to severe sepsis with shock.  Hypotension noted earlier today.  Patient is alert and oriented x4. Family and patient upset with rationale of why medication is being held.

## 2017-11-20 NOTE — ED Notes (Signed)
Code sepsis activated RN Autumn aware

## 2017-11-20 NOTE — ED Notes (Signed)
Dr. Gardner at bedside 

## 2017-11-20 NOTE — ED Notes (Signed)
PCXR completed.

## 2017-11-21 ENCOUNTER — Other Ambulatory Visit: Payer: Self-pay

## 2017-11-21 ENCOUNTER — Inpatient Hospital Stay (HOSPITAL_COMMUNITY): Payer: Medicare HMO

## 2017-11-21 LAB — COMPREHENSIVE METABOLIC PANEL
ALT: 28 U/L (ref 17–63)
AST: 27 U/L (ref 15–41)
Albumin: 2.6 g/dL — ABNORMAL LOW (ref 3.5–5.0)
Alkaline Phosphatase: 53 U/L (ref 38–126)
Anion gap: 6 (ref 5–15)
BUN: 10 mg/dL (ref 6–20)
CO2: 20 mmol/L — AB (ref 22–32)
CREATININE: 0.59 mg/dL — AB (ref 0.61–1.24)
Calcium: 7.9 mg/dL — ABNORMAL LOW (ref 8.9–10.3)
Chloride: 112 mmol/L — ABNORMAL HIGH (ref 101–111)
GFR calc Af Amer: >60 mL/min (ref >60)
GFR calc non Af Amer: >60 mL/min (ref >60)
Glucose, Bld: 171 mg/dL — ABNORMAL HIGH (ref 65–99)
Potassium: 3.5 mmol/L (ref 3.5–5.1)
SODIUM: 138 mmol/L (ref 135–145)
Total Bilirubin: 0.7 mg/dL (ref 0.3–1.2)
Total Protein: 5 g/dL — ABNORMAL LOW (ref 6.5–8.1)

## 2017-11-21 LAB — CBC
HCT: 24.1 % — ABNORMAL LOW (ref 39.0–52.0)
Hemoglobin: 7.9 g/dL — ABNORMAL LOW (ref 13.0–17.0)
MCH: 38 pg — ABNORMAL HIGH (ref 26.0–34.0)
MCHC: 32.8 g/dL (ref 30.0–36.0)
MCV: 115.9 fL — ABNORMAL HIGH (ref 78.0–100.0)
PLATELETS: 359 10*3/uL (ref 150–400)
RBC: 2.08 MIL/uL — ABNORMAL LOW (ref 4.22–5.81)
RDW: 15.9 % — ABNORMAL HIGH (ref 11.5–15.5)
WBC: 11.2 10*3/uL — AB (ref 4.0–10.5)

## 2017-11-21 LAB — MRSA PCR SCREENING: MRSA by PCR: NEGATIVE

## 2017-11-21 LAB — GLUCOSE, CAPILLARY
Glucose-Capillary: 160 mg/dL — ABNORMAL HIGH (ref 65–99)
Glucose-Capillary: 152 mg/dL — ABNORMAL HIGH (ref 65–99)
Glucose-Capillary: 156 mg/dL — ABNORMAL HIGH (ref 65–99)
Glucose-Capillary: 194 mg/dL — ABNORMAL HIGH (ref 65–99)

## 2017-11-21 LAB — LACTIC ACID, PLASMA: Lactic Acid, Venous: 1.2 mmol/L (ref 0.5–1.9)

## 2017-11-21 LAB — HEMOGLOBIN A1C
HEMOGLOBIN A1C: 8.5 % — AB (ref 4.8–5.6)
MEAN PLASMA GLUCOSE: 197.25 mg/dL

## 2017-11-21 IMAGING — DX DG ABDOMEN ACUTE W/ 1V CHEST
3 series · 3 of 3 positions shown · non-contrast
Comparison: Abdomen films of [DATE], chest x-ray [DATE],
and CT abdomen pelvis of [DATE]

CLINICAL DATA: Abdominal pain, diarrhea

EXAM:
DG ABDOMEN ACUTE W/ 1V CHEST

[w chest pa]
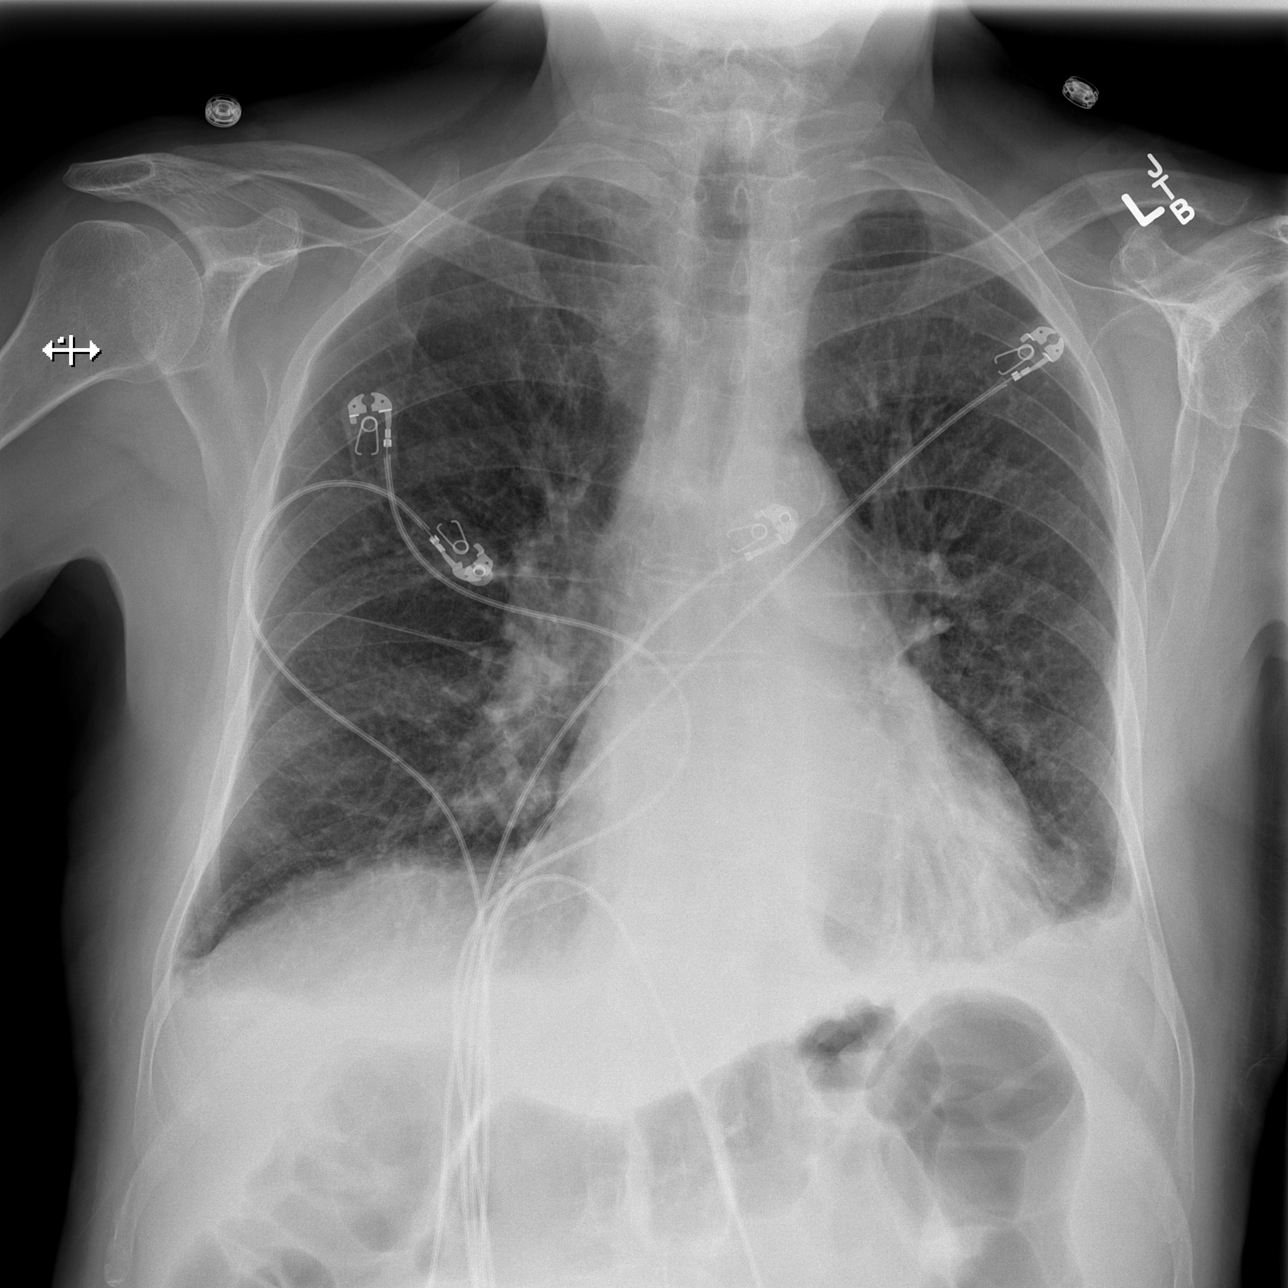

[w abdomen upright]
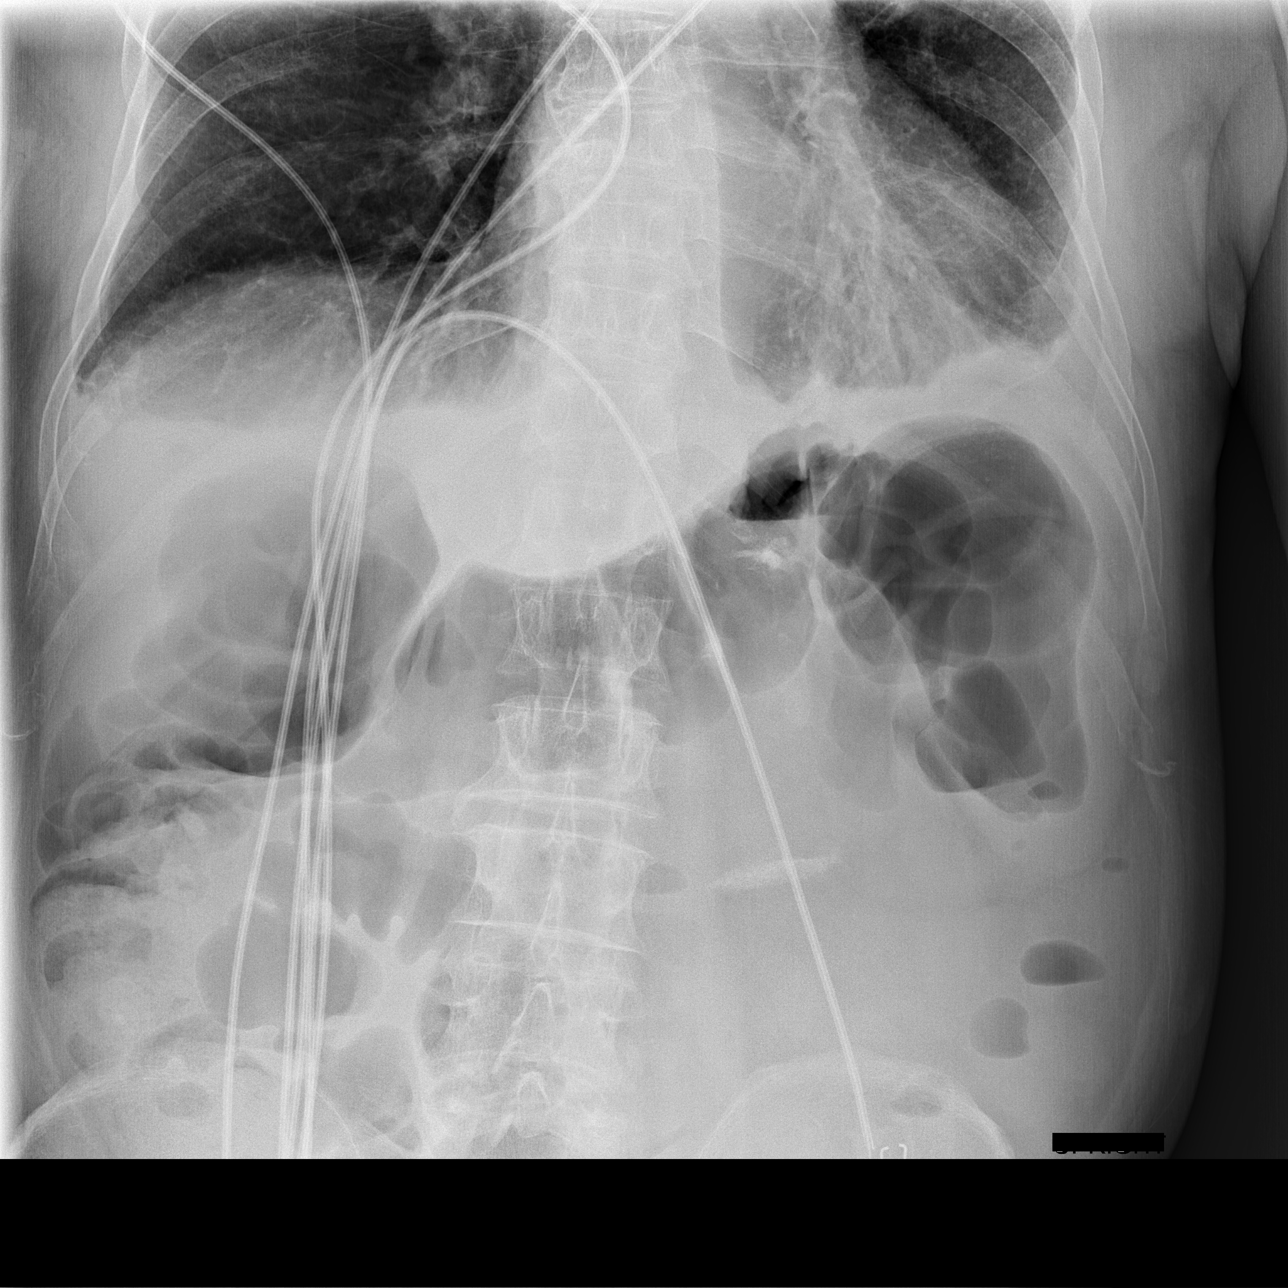

[t abdomen supine]
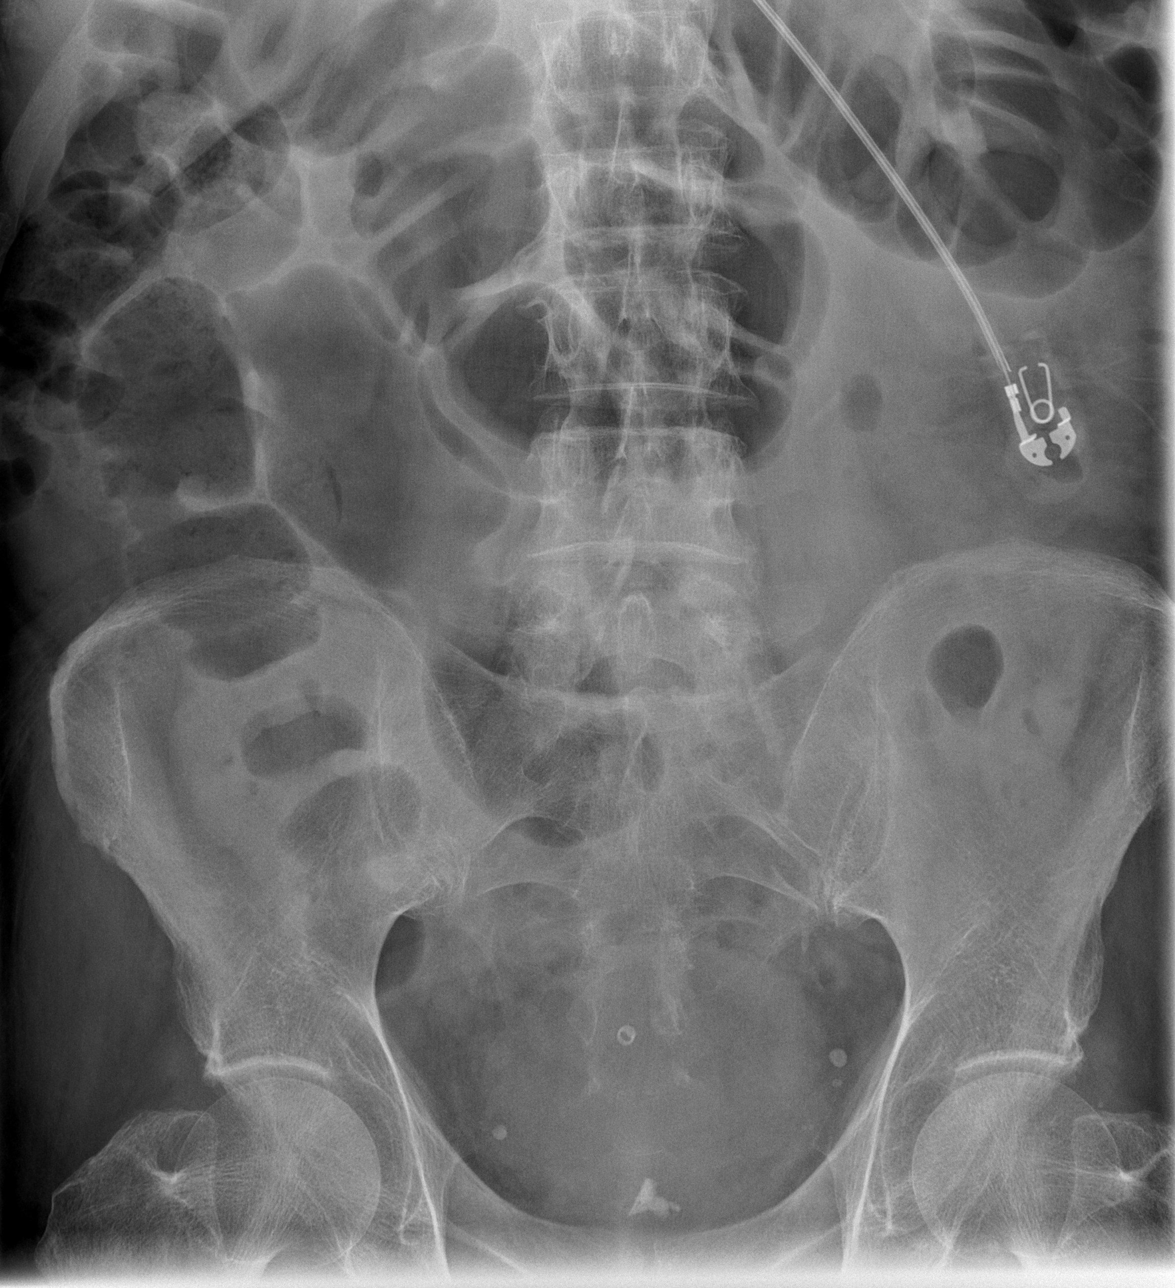

[3 of 3 positions shown; findings below may reference images not displayed]

FINDINGS: Small pleural effusions are again noted, left slightly larger than
right. The heart is mildly enlarged and there may be very mild
pulmonary vascular congestion present. There are somewhat prominent
markings overlying the heart shadow medially at the left lung base
and patchy pneumonia cannot be excluded.

Supine and erect views of the abdomen show a moderate amount of air
throughout the colon with some small bowel gas as well. Some edema
of colon cannot be excluded on the erect view in the region of the
high positioned sigmoid colon. No free air is seen on the erect
view. No opaque calculi are noted. The bones are unremarkable.
IMPRESSION: 1. Small bilateral pleural effusions left greater than right. Cannot
exclude left lower lobe pneumonia.
2. No bowel obstruction, but edema probably of the colon on the
erect view in the region of the sigmoid colon cannot be excluded.

## 2017-11-21 MED ORDER — METRONIDAZOLE 500 MG PO TABS
500.0000 mg | ORAL_TABLET | Freq: Three times a day (TID) | ORAL | Status: DC
  Administered 2017-11-21 – 2017-11-22 (×3): 500 mg via ORAL
  Filled 2017-11-21 (×3): qty 1

## 2017-11-21 MED ORDER — POTASSIUM CHLORIDE CRYS ER 20 MEQ PO TBCR
40.0000 meq | EXTENDED_RELEASE_TABLET | Freq: Once | ORAL | Status: AC
  Administered 2017-11-21: 40 meq via ORAL
  Filled 2017-11-21: qty 2

## 2017-11-21 NOTE — Plan of Care (Signed)
  Problem: Activity: Goal: Risk for activity intolerance will decrease Outcome: Progressing  Pt ambulates in room with standby assistance

## 2017-11-21 NOTE — Plan of Care (Signed)
  Problem: Education: Goal: Knowledge of General Education information will improve Outcome: Completed/Met  Pt oriented to unit, plan of care, and method of reporting concerns. Bedside table and call light within reach

## 2017-11-21 NOTE — Progress Notes (Addendum)
[facility] Progress Note  Patient: Tommy Peterson ZDG:[id number]   PCP: Jani Gravel, MD DOB: 01-Jul-1941   DOA: 11/20/2017   DOS: 11/21/2017   Date of Service: the patient was seen and examined on 11/21/2017  Subjective: No bowel movement yesterday, had one loose bowel movement today.  No blood.  No abdominal pain.  Feeling better.  No nausea no vomiting.  Brief hospital course: Pt. with PMH of Chronic large granular lymphocytic leukemia, diabetes for 25+ years, hypertension, hyperlipidemia, colonic adenoma, chronic diarrhea since at least 2010 (per patient), active colitis on colon biopsies at the time (last colonoscopy on 05/20/2014 colon appeared normal) on budenoside 6 mg/day, history of C. difficile diarrhea fall 2017 s/p PO vancomycin, s/p prostate surgery in 2009, MGUS, transfusion dependent anemia; admitted on 11/20/2017, presented with complaint of nausea vomiting and diarrhea, was found to have sepsis. Currently further plan is continue current antibiotics.  Assessment and Plan: 1.  Sepsis with shock acute intra-abdominal infection. Most likely viral gastroenteritis. Nausea vomiting and diarrhea. Recent use of Cipro and Levaquin for UTI and pneumonia respectively. T-max 104.7.  Tachycardic tachypneic on admission. Hypotensive now. Received 2.5 L of normal saline bolus. Continued on 125 cc normal saline. Blood pressure in 80s again, will provide 1 more liter of bolus and continue to monitor. Blood pressure refractory, patient is actually on chronic steroids at home, patient received Solu-Cortef 100 x 1 C. difficile PCR performed which was negative. Lactic acid improving. Influenza panel negative as well. HIV negative. GI pathogen panel is also negative. UA shows evidence of pyuria, urine culture growing coagulase-negative staph, MRSA PCR negative.. Blood culture currently no growth. Does not have any respiratory symptoms. Possible sigmoid colitis on x-ray. For now since  patient's vomiting as well as diarrhea has resolved and abdominal pain is minimal but advanced the patient to soft diet. Continue IV ceftriaxone, Flagyl for empiric coverage for now.  2.  Chronic collagenous colitis, with chronic diarrhea. Noncompliance with Entocort. Patient mentions that when he does not take budesonide he has diarrhea and he has not taken it for last 1 week. Most likely this is the etiology along with the possibility of a viral gastroenteritis for patient's presentation. Will resume budesonide.  3.  Type 2 diabetes mellitus.  Uncontrolled Long-term insulin dependence. Patient is on combination Lantus at home. Home regimen is 28 units along with GLP-1.  Due to poor p.o. intake patient was started on 10 units of Lantus, I will increase to 15 units for now. Continue sliding scale insulin moderate.  IMA globin A1c 8.5, about the same as before.  Uncontrolled.  4.  MGUS, chronic granular lymphocytic leukemia. Dilutional anemia Patient on methotrexate, currently will be on hold. Hemoglobin trend from 9.7-7.9 today.  Appears dilutional as all cell lines are down.  Monitor.  5.  History of BPH with urinary urgency. Patient is on both Flomax as well as oxybutynin(Given recently by urology for symptoms of frequency). At present I will discontinue oxybutynin continue Flomax for the patient right now. Monitor blood pressure.  Diet: Soft diet carb modified DVT Prophylaxis: subcutaneous Heparin  Advance goals of care discussion: full code  Family Communication: no family was present at bedside, at the time of interview.   Disposition:  Discharge to home.  Consultants: none Procedures: none  Antibiotics: Anti-infectives (From admission, onward)   Start     Dose/Rate Route Frequency Ordered Stop   11/21/17 1500  vancomycin (VANCOCIN) 1,250 mg in sodium chloride 0.9 % 250 mL IVPB  Status:  Discontinued     1,250 mg 166.7 mL/hr over 90 Minutes Intravenous Every 24 hours  11/20/17 1455 11/21/17 1110   11/21/17 1415  metroNIDAZOLE (FLAGYL) tablet 500 mg     500 mg Oral Every 8 hours 11/21/17 1408     11/21/17 0600  vancomycin (VANCOCIN) 1,250 mg in sodium chloride 0.9 % 250 mL IVPB  Status:  Discontinued     1,250 mg 166.7 mL/hr over 90 Minutes Intravenous Every 24 hours 11/20/17 0708 11/20/17 0751   11/20/17 1000  cefTRIAXone (ROCEPHIN) 1 g in sodium chloride 0.9 % 100 mL IVPB     1 g 200 mL/hr over 30 Minutes Intravenous Daily 11/20/17 0642     11/20/17 0800  metroNIDAZOLE (FLAGYL) IVPB 500 mg  Status:  Discontinued     500 mg 100 mL/hr over 60 Minutes Intravenous Every 8 hours 11/20/17 0642 11/21/17 1110   11/20/17 0215  piperacillin-tazobactam (ZOSYN) IVPB 3.375 g     3.375 g 100 mL/hr over 30 Minutes Intravenous  Once 11/20/17 0209 11/20/17 0339   11/20/17 0215  vancomycin (VANCOCIN) IVPB 1000 mg/200 mL premix  Status:  Discontinued     1,000 mg 200 mL/hr over 60 Minutes Intravenous  Once 11/20/17 0209 11/20/17 0210   11/20/17 0215  vancomycin (VANCOCIN) 1,500 mg in sodium chloride 0.9 % 500 mL IVPB     1,500 mg 250 mL/hr over 120 Minutes Intravenous  Once 11/20/17 0210 11/20/17 0606       Objective: Physical Exam: Vitals:   11/21/17 0350 11/21/17 0808 11/21/17 1242 11/21/17 1641  BP: (!) 100/50 (!) 112/54 (!) 107/49 120/61  Pulse: 60 (!) 53 65 79  Resp: 18 (!) 23 (!) 21 (!) 29  Temp: 98.6 F (37 C) 97.8 F (36.6 C) 98.1 F (36.7 C) 98.1 F (36.7 C)  TempSrc: Oral Oral Oral Oral  SpO2: 98% 97% 97% 95%  Weight:      Height:        Intake/Output Summary (Last 24 hours) at 11/21/2017 1718 Last data filed at 11/21/2017 1300 Gross per 24 hour  Intake 1530 ml  Output 1550 ml  Net -20 ml   Filed Weights   11/20/17 0331 11/21/17 0000  Weight: 77.1 kg (170 lb) 75.9 kg (167 lb 6.4 oz)   General: Alert, Awake and Oriented to Time, Place and Person. Appear in moderate distress, affect appropriate Eyes: PERRL, Conjunctiva normal ENT: Oral  Mucosa clear moist. Neck: no JVD, no Abnormal Mass Or lumps Cardiovascular: S1 and S2 Present, no Murmur, Peripheral Pulses Present Respiratory: normal respiratory effort, Bilateral Air entry equal and Decreased, no use of accessory muscle, Clear to Auscultation, no Crackles, no wheezes Abdomen: Bowel Sound present, Soft and mild diffuse tenderness, no hernia Skin: no redness, no Rash, no induration Extremities: no Pedal edema, no calf tenderness Neurologic: Grossly no focal neuro deficit. Bilaterally Equal motor strength  Data Reviewed: CBC: Recent Labs  Lab 11/20/17 0259 11/21/17 0800  WBC 17.5* 11.2*  NEUTROABS 15.3*  --   HGB 9.7* 7.9*  HCT 29.6* 24.1*  MCV 114.7* 115.9*  PLT 420* 169   Basic Metabolic Panel: Recent Labs  Lab 11/20/17 0259 11/21/17 0800  NA 136 138  K 4.3 3.5  CL 104 112*  CO2 21* 20*  GLUCOSE 329* 171*  BUN 11 10  CREATININE 0.87 0.59*  CALCIUM 9.0 7.9*    Liver Function Tests: Recent Labs  Lab 11/20/17 0259 11/21/17 0800  AST 21 27  ALT 20  28  ALKPHOS 73 53  BILITOT 1.1 0.7  PROT 6.2* 5.0*  ALBUMIN 3.4* 2.6*   No results for input(s): LIPASE, AMYLASE in the last 168 hours. No results for input(s): AMMONIA in the last 168 hours. Coagulation Profile: No results for input(s): INR, PROTIME in the last 168 hours. Cardiac Enzymes: No results for input(s): CKTOTAL, CKMB, CKMBINDEX, TROPONINI in the last 168 hours. BNP (last 3 results) No results for input(s): PROBNP in the last 8760 hours. CBG: Recent Labs  Lab 11/20/17 1703 11/20/17 2137 11/21/17 0803 11/21/17 1135 11/21/17 1624  GLUCAP 138* 145* 160* 152* 156*   Studies: Dg Abd Acute W/chest  Result Date: 11/21/2017 CLINICAL DATA:  Abdominal pain, diarrhea EXAM: DG ABDOMEN ACUTE W/ 1V CHEST COMPARISON:  Abdomen films of 11/20/2016, chest x-ray 11/20/2017, and CT abdomen pelvis of 11/08/2017 FINDINGS: Small pleural effusions are again noted, left slightly larger than right. The  heart is mildly enlarged and there may be very mild pulmonary vascular congestion present. There are somewhat prominent markings overlying the heart shadow medially at the left lung base and patchy pneumonia cannot be excluded. Supine and erect views of the abdomen show a moderate amount of air throughout the colon with some small bowel gas as well. Some edema of colon cannot be excluded on the erect view in the region of the high positioned sigmoid colon. No free air is seen on the erect view. No opaque calculi are noted. The bones are unremarkable. IMPRESSION: 1. Small bilateral pleural effusions left greater than right. Cannot exclude left lower lobe pneumonia. 2. No bowel obstruction, but edema probably of the colon on the erect view in the region of the sigmoid colon cannot be excluded. Electronically Signed   By: Ivar Drape M.D.   On: 11/21/2017 12:19    Scheduled Meds: . budesonide  9 mg Oral Daily  . enoxaparin (LOVENOX) injection  40 mg Subcutaneous Daily  . insulin aspart  0-15 Units Subcutaneous TID WC  . insulin aspart  0-5 Units Subcutaneous QHS  . insulin glargine  15 Units Subcutaneous QHS  . metroNIDAZOLE  500 mg Oral Q8H  . tamsulosin  0.4 mg Oral Daily   Continuous Infusions: . sodium chloride 125 mL/hr at 11/21/17 1001  . cefTRIAXone (ROCEPHIN)  IV Stopped (11/21/17 1200)   PRN Meds: acetaminophen **OR** acetaminophen, ondansetron **OR** ondansetron (ZOFRAN) IV  Time spent: 35 minutes,  Author: Berle Mull, MD [facility] Pager: [phone] 11/21/2017 5:18 PM  If 7PM-7AM, please contact night-coverage at [email or web address], password [facility]

## 2017-11-22 DIAGNOSIS — N390 Urinary tract infection, site not specified: Secondary | ICD-10-CM

## 2017-11-22 LAB — COMPREHENSIVE METABOLIC PANEL
ALBUMIN: 2.6 g/dL — AB (ref 3.5–5.0)
ALT: 31 U/L (ref 17–63)
AST: 21 U/L (ref 15–41)
Alkaline Phosphatase: 51 U/L (ref 38–126)
Anion gap: 7 (ref 5–15)
BILIRUBIN TOTAL: 0.5 mg/dL (ref 0.3–1.2)
BUN: 7 mg/dL (ref 6–20)
CO2: 19 mmol/L — ABNORMAL LOW (ref 22–32)
Calcium: 8 mg/dL — ABNORMAL LOW (ref 8.9–10.3)
Chloride: 113 mmol/L — ABNORMAL HIGH (ref 101–111)
Creatinine, Ser: 0.53 mg/dL — ABNORMAL LOW (ref 0.61–1.24)
GFR calc Af Amer: >60 mL/min (ref >60)
GFR calc non Af Amer: >60 mL/min (ref >60)
GLUCOSE: 155 mg/dL — AB (ref 65–99)
POTASSIUM: 3.3 mmol/L — AB (ref 3.5–5.1)
Sodium: 139 mmol/L (ref 135–145)
Total Protein: 5 g/dL — ABNORMAL LOW (ref 6.5–8.1)

## 2017-11-22 LAB — URINE CULTURE: Culture: >=100000 — AB

## 2017-11-22 LAB — CBC WITH DIFFERENTIAL/PLATELET
BASOS ABS: 0 10*3/uL (ref 0.0–0.1)
Basophils Relative: 0 %
Eosinophils Absolute: 0.2 10*3/uL (ref 0.0–0.7)
Eosinophils Relative: 2 %
HCT: 25.2 % — ABNORMAL LOW (ref 39.0–52.0)
HEMOGLOBIN: 8 g/dL — AB (ref 13.0–17.0)
LYMPHS ABS: 3.3 10*3/uL (ref 0.7–4.0)
LYMPHS PCT: 33 %
MCH: 36.7 pg — ABNORMAL HIGH (ref 26.0–34.0)
MCHC: 31.7 g/dL (ref 30.0–36.0)
MCV: 115.6 fL — ABNORMAL HIGH (ref 78.0–100.0)
Monocytes Absolute: 1.4 10*3/uL — ABNORMAL HIGH (ref 0.1–1.0)
Monocytes Relative: 14 %
NEUTROS ABS: 5.2 10*3/uL (ref 1.7–7.7)
Neutrophils Relative %: 51 %
Platelets: 425 10*3/uL — ABNORMAL HIGH (ref 150–400)
RBC: 2.18 MIL/uL — ABNORMAL LOW (ref 4.22–5.81)
RDW: 15.6 % — AB (ref 11.5–15.5)
WBC: 10.1 10*3/uL (ref 4.0–10.5)

## 2017-11-22 LAB — GLUCOSE, CAPILLARY
GLUCOSE-CAPILLARY: 94 mg/dL (ref 65–99)
Glucose-Capillary: 154 mg/dL — ABNORMAL HIGH (ref 65–99)
Glucose-Capillary: 196 mg/dL — ABNORMAL HIGH (ref 65–99)
Glucose-Capillary: 160 mg/dL — ABNORMAL HIGH (ref 65–99)

## 2017-11-22 LAB — MAGNESIUM: MAGNESIUM: 1.9 mg/dL (ref 1.7–2.4)

## 2017-11-22 MED ORDER — DIPHENHYDRAMINE HCL 25 MG PO CAPS
25.0000 mg | ORAL_CAPSULE | Freq: Every evening | ORAL | Status: DC | PRN
  Administered 2017-11-23: 25 mg via ORAL
  Filled 2017-11-22: qty 1

## 2017-11-22 MED ORDER — SODIUM CHLORIDE 0.9 % IV SOLN
100.0000 mg | Freq: Two times a day (BID) | INTRAVENOUS | Status: DC
  Administered 2017-11-22 – 2017-11-24 (×5): 100 mg via INTRAVENOUS
  Filled 2017-11-22 (×6): qty 100

## 2017-11-22 NOTE — Progress Notes (Signed)
PROGRESS NOTE  Tommy Peterson QBH:[id number] DOB: 1941/06/26 DOA: 11/20/2017 PCP: Jani Gravel, MD   LOS: 2 days   Brief Narrative / Interim history: Pt. with PMH of Chronic large granular lymphocytic leukemia, diabetes for 25+ years, hypertension, hyperlipidemia, colonic adenoma, chronic diarrhea since at least 2010 (per patient), active colitis on colon biopsies at the time (last colonoscopy on 05/20/2014 colon appeared normal) on budenoside 6 mg/day, history of C. difficile diarrhea fall 2017, prostate surgery in 2009, MGUS, transfusion dependent anemia; admitted on 11/20/2017, presented with complaint of nausea vomiting and diarrhea patient has been telling me that he has had URI type symptoms for the past 1-2 weeks. He was seen in the ED last week and was given Omnicef.  He finished that course however still feels slight pleuritic type chest pain on especially on the left side with deep inspiration.  He is also been complaining of significant dysuria and increased frequency, he was treated for UTI with ciprofloxacin and Levaquin couple of times in the last month without any improvement in his symptoms.  Assessment & Plan: Principal Problem:   Sepsis ([place]) Active Problems:   Large granular lymphocytic leukemia (HCC)   Left lower lobe pneumonia (HCC)   Nausea vomiting and diarrhea   Sepsis / UTI -Patient with minimal GI symptoms, he has had chronic diarrhea and states that this is not abnormal for him.  His nausea and vomiting may be due to UTI, he is growing staph epidermidis which is resistant to ciprofloxacin explaining his ongoing symptoms.  This bacteria may be a contaminant however given persistent symptoms would favor to treat -We will change antibiotics to doxycycline for sensitivities, discontinue ceftriaxone and metronidazole -Blood cultures without growth  Nausea/vomiting/diarrhea -Patient with chronic diarrhea, states that this is normal for him and nothing was out of the  ordinary -Nausea vomiting likely due to UTI -C. difficile, GI pathogen panel were obtained and they were negative -Influenza panel was negative  Possible left lower lobe pneumonia -With small bilateral pleural effusions left greater than right, patient with ongoing type chest pain, adequately treated with Omnicef but now on doxycycline as above should help  Chronic collagenous colitis -Continue Entocort  Type 2 diabetes mellitus -Poorly controlled, A1c 8.5, continue Lantus and sliding scale  MGUS, chronic granular lymphocytic leukemia -Hold methotrexate, closely monitor hemoglobin, currently stable -He is being [facility] at [facility]  BPH with urinary urgency  -Is supposed to see Dr. Jeffie Pollock in couple weeks   DVT prophylaxis: heparin Code Status: Full code Family Communication: wife present at bedside Disposition Plan: home when ready   Consultants:   None   Procedures:   None   Antimicrobials:  Ceftriaxone / Metronidazole 4/16 >> 4/18  Doxycycline 4/18 >>   Subjective: -Feeling a little better than when he came in, no more nausea or vomiting, however still complains of significant dysuria/increased frequency  Objective: Vitals:   11/22/17 0019 11/22/17 0524 11/22/17 0756 11/22/17 1144  BP: (!) 106/49 126/64 135/75 132/62  Pulse: 72 61 74 76  Resp: 20 17 20  (!) 24  Temp: 97.9 F (36.6 C) 98.5 F (36.9 C) 98.9 F (37.2 C) 98.4 F (36.9 C)  TempSrc: Oral Oral Oral Axillary  SpO2: 95% 94% 92% 95%  Weight:  75 kg (165 lb 4.8 oz)    Height:        Intake/Output Summary (Last 24 hours) at 11/22/2017 1314 Last data filed at 11/22/2017 0938 Gross per 24 hour  Intake 3348.75 ml  Output -  Net 3348.75 ml   Filed Weights   11/20/17 0331 11/21/17 0000 11/22/17 0524  Weight: 77.1 kg (170 lb) 75.9 kg (167 lb 6.4 oz) 75 kg (165 lb 4.8 oz)    Examination:  Constitutional: NAD Eyes: lids and conjunctivae normal Respiratory: clear to auscultation bilaterally, no  wheezing, no crackles. Normal respiratory effort.  Cardiovascular: Regular rate and rhythm, no murmurs / rubs / gallops. No LE edema. Abdomen: no tenderness. Bowel sounds positive.  Skin: no rashes Neurologic: non focal  Psychiatric: Normal judgment and insight.    Data Reviewed: I have independently reviewed following labs and imaging studies   CBC: Recent Labs  Lab 11/20/17 0259 11/21/17 0800 11/22/17 0346  WBC 17.5* 11.2* 10.1  NEUTROABS 15.3*  --  5.2  HGB 9.7* 7.9* 8.0*  HCT 29.6* 24.1* 25.2*  MCV 114.7* 115.9* 115.6*  PLT 420* 359 786*   Basic Metabolic Panel: Recent Labs  Lab 11/20/17 0259 11/21/17 0800 11/22/17 0346  NA 136 138 139  K 4.3 3.5 3.3*  CL 104 112* 113*  CO2 21* 20* 19*  GLUCOSE 329* 171* 155*  BUN 11 10 7   CREATININE 0.87 0.59* 0.53*  CALCIUM 9.0 7.9* 8.0*  MG  --   --  1.9   GFR: Estimated Creatinine Clearance: 78.6 mL/min (A) (by C-G formula based on SCr of 0.53 mg/dL (L)). Liver Function Tests: Recent Labs  Lab 11/20/17 0259 11/21/17 0800 11/22/17 0346  AST 21 27 21   ALT 20 28 31   ALKPHOS 73 53 51  BILITOT 1.1 0.7 0.5  PROT 6.2* 5.0* 5.0*  ALBUMIN 3.4* 2.6* 2.6*   No results for input(s): LIPASE, AMYLASE in the last 168 hours. No results for input(s): AMMONIA in the last 168 hours. Coagulation Profile: No results for input(s): INR, PROTIME in the last 168 hours. Cardiac Enzymes: No results for input(s): CKTOTAL, CKMB, CKMBINDEX, TROPONINI in the last 168 hours. BNP (last 3 results) No results for input(s): PROBNP in the last 8760 hours. HbA1C: Recent Labs    11/21/17 0412  HGBA1C 8.5*   CBG: Recent Labs  Lab 11/21/17 1135 11/21/17 1624 11/21/17 2035 11/22/17 0753 11/22/17 1118  GLUCAP 152* 156* 194* 94 154*   Lipid Profile: No results for input(s): CHOL, HDL, LDLCALC, TRIG, CHOLHDL, LDLDIRECT in the last 72 hours. Thyroid Function Tests: No results for input(s): TSH, T4TOTAL, FREET4, T3FREE, THYROIDAB in the last  72 hours. Anemia Panel: No results for input(s): VITAMINB12, FOLATE, FERRITIN, TIBC, IRON, RETICCTPCT in the last 72 hours. Urine analysis:    Component Value Date/Time   COLORURINE YELLOW 11/20/2017 0303   APPEARANCEUR CLEAR 11/20/2017 0303   LABSPEC 1.011 11/20/2017 0303   PHURINE 7.0 11/20/2017 0303   GLUCOSEU >=500 (A) 11/20/2017 0303   HGBUR NEGATIVE 11/20/2017 0303   BILIRUBINUR NEGATIVE 11/20/2017 0303   BILIRUBINUR negative 10/29/2017 1428   KETONESUR 5 (A) 11/20/2017 0303   PROTEINUR NEGATIVE 11/20/2017 0303   UROBILINOGEN 0.2 10/29/2017 1428   NITRITE NEGATIVE 11/20/2017 0303   LEUKOCYTESUR TRACE (A) 11/20/2017 0303   Sepsis Labs: Invalid input(s): PROCALCITONIN, LACTICIDVEN  Recent Results (from the past 240 hour(s))  Blood Culture (routine x 2)     Status: None (Preliminary result)   Collection Time: 11/20/17  2:50 AM  Result Value Ref Range Status   Specimen Description BLOOD LEFT HAND  Final   Special Requests   Final    BOTTLES DRAWN AEROBIC AND ANAEROBIC Blood Culture adequate volume   Culture   Final  NO GROWTH 1 DAY Performed at [facility], [place] [address] [place], [place] [address]    Report Status PENDING  Incomplete  Blood Culture (routine x 2)     Status: None (Preliminary result)   Collection Time: 11/20/17  2:57 AM  Result Value Ref Range Status   Specimen Description BLOOD RIGHT FOREARM  Final   Special Requests   Final    BOTTLES DRAWN AEROBIC AND ANAEROBIC Blood Culture adequate volume   Culture   Final    NO GROWTH 1 DAY Performed at [facility], [place] [address], [place], [place] [address]    Report Status PENDING  Incomplete  Culture, Urine     Status: Abnormal   Collection Time: 11/20/17  2:57 AM  Result Value Ref Range Status   Specimen Description URINE, RANDOM  Final   Special Requests   Final    NONE Performed at [facility], [place] [address] [place], [place] [address]    Culture >=100,000 COLONIES/mL  STAPHYLOCOCCUS EPIDERMIDIS (A)  Final   Report Status 11/22/2017 FINAL  Final   Organism ID, Bacteria STAPHYLOCOCCUS EPIDERMIDIS (A)  Final      Susceptibility   Staphylococcus epidermidis - MIC*    CIPROFLOXACIN >=8 RESISTANT Resistant     GENTAMICIN <=0.5 SENSITIVE Sensitive     NITROFURANTOIN <=16 SENSITIVE Sensitive     OXACILLIN >=4 RESISTANT Resistant     TETRACYCLINE 2 SENSITIVE Sensitive     VANCOMYCIN 1 SENSITIVE Sensitive     TRIMETH/SULFA 160 RESISTANT Resistant     CLINDAMYCIN RESISTANT Resistant     RIFAMPIN <=0.5 SENSITIVE Sensitive     Inducible Clindamycin POSITIVE Resistant     * >=100,000 COLONIES/mL STAPHYLOCOCCUS EPIDERMIDIS  C difficile quick scan w PCR reflex     Status: None   Collection Time: 11/20/17  4:30 AM  Result Value Ref Range Status   C Diff antigen NEGATIVE NEGATIVE Final   C Diff toxin NEGATIVE NEGATIVE Final   C Diff interpretation No C. difficile detected.  Final    Comment: Performed at [facility], [place] [address], [place], [place] [address]  Gastrointestinal Panel by PCR , Stool     Status: None   Collection Time: 11/20/17  4:30 AM  Result Value Ref Range Status   Campylobacter species NOT DETECTED NOT DETECTED Final   Plesimonas shigelloides NOT DETECTED NOT DETECTED Final   Salmonella species NOT DETECTED NOT DETECTED Final   Yersinia enterocolitica NOT DETECTED NOT DETECTED Final   Vibrio species NOT DETECTED NOT DETECTED Final   Vibrio cholerae NOT DETECTED NOT DETECTED Final   Enteroaggregative E coli (EAEC) NOT DETECTED NOT DETECTED Final   Enteropathogenic E coli (EPEC) NOT DETECTED NOT DETECTED Final   Enterotoxigenic E coli (ETEC) NOT DETECTED NOT DETECTED Final   Shiga like toxin producing E coli (STEC) NOT DETECTED NOT DETECTED Final   Shigella/Enteroinvasive E coli (EIEC) NOT DETECTED NOT DETECTED Final   Cryptosporidium NOT DETECTED NOT DETECTED Final   Cyclospora cayetanensis NOT DETECTED NOT DETECTED Final   Entamoeba  histolytica NOT DETECTED NOT DETECTED Final   Giardia lamblia NOT DETECTED NOT DETECTED Final   Adenovirus F40/41 NOT DETECTED NOT DETECTED Final   Astrovirus NOT DETECTED NOT DETECTED Final   Norovirus GI/GII NOT DETECTED NOT DETECTED Final   Rotavirus A NOT DETECTED NOT DETECTED Final   Sapovirus (I, II, IV, and V) NOT DETECTED NOT DETECTED Final    Comment: Performed at  [facility], [place] [place], [place] [address]  MRSA PCR Screening     Status: None   Collection Time: 11/21/17 12:37 AM  Result Value Ref Range Status   MRSA by PCR NEGATIVE NEGATIVE Final    Comment:        The GeneXpert MRSA Assay (FDA approved for NASAL specimens only), is one component of a comprehensive MRSA colonization surveillance program. It is not intended to diagnose MRSA infection nor to guide or monitor treatment for MRSA infections. Performed at [facility], [place] [address] [place], [place] [address]       Radiology Studies: Dg Abd Acute W/chest  Result Date: 11/21/2017 CLINICAL DATA:  Abdominal pain, diarrhea EXAM: DG ABDOMEN ACUTE W/ 1V CHEST COMPARISON:  Abdomen films of 11/20/2016, chest x-ray 11/20/2017, and CT abdomen pelvis of 11/08/2017 FINDINGS: Small pleural effusions are again noted, left slightly larger than right. The heart is mildly enlarged and there may be very mild pulmonary vascular congestion present. There are somewhat prominent markings overlying the heart shadow medially at the left lung base and patchy pneumonia cannot be excluded. Supine and erect views of the abdomen show a moderate amount of air throughout the colon with some small bowel gas as well. Some edema of colon cannot be excluded on the erect view in the region of the high positioned sigmoid colon. No free air is seen on the erect view. No opaque calculi are noted. The bones are unremarkable. IMPRESSION: 1. Small bilateral pleural effusions left greater than right. Cannot exclude left lower  lobe pneumonia. 2. No bowel obstruction, but edema probably of the colon on the erect view in the region of the sigmoid colon cannot be excluded. Electronically Signed   By: Ivar Drape M.D.   On: 11/21/2017 12:19   Dg Abd Portable 1v  Result Date: 11/20/2017 CLINICAL DATA:  Abdominal pain EXAM: PORTABLE ABDOMEN - 1 VIEW COMPARISON:  Abdominal CT from 12 days ago FINDINGS: Moderate gas in the transverse colon. Overall nonobstructive bowel gas pattern. No concerning mass effect or gas collection. Mild streaky densities at the bases, reference contemporaneous chest x-ray. IMPRESSION: Nonobstructive bowel gas pattern. Electronically Signed   By: Monte Fantasia M.D.   On: 11/20/2017 14:29     Scheduled Meds: . budesonide  9 mg Oral Daily  . enoxaparin (LOVENOX) injection  40 mg Subcutaneous Daily  . insulin aspart  0-15 Units Subcutaneous TID WC  . insulin aspart  0-5 Units Subcutaneous QHS  . insulin glargine  15 Units Subcutaneous QHS  . tamsulosin  0.4 mg Oral Daily   Continuous Infusions: . sodium chloride 75 mL/hr at 11/22/17 0649  . doxycycline (VIBRAMYCIN) IV Stopped (11/22/17 1057)   Marzetta Board, MD, PhD [facility] Pager [phone]  If 7PM-7AM, please contact night-coverage [email or web address] Password [email or web address] 11/22/2017, 1:14 PM

## 2017-11-22 NOTE — Evaluation (Signed)
Physical Therapy Evaluation & Discharge Patient Details Name: Tommy Peterson MRN: [id number] DOB: Oct 09, 1940 Today's Date: 11/22/2017   History of Present Illness  Pt is a 77 y.o. male admitted 11/20/17 with cough, fever, N/V/D; worked up for sepsis with acute intra-abdominal infection, likely viral gastroenteritis. Recent imaging showed LLL PNA on 4/4. PMH includes chronic granular lymphocytic leukemia, chronic collagenous colitis, HTN, DM.    Clinical Impression  Pt presents with an overall decrease in functional mobility secondary to above. PTA, pt indep and lives with family available for 24/7 support. Today, pt able to amb 300' with and without RW, supervision for balance; VSS. Pt is not interested in continuing to work with acute PT while admitted; also not interested in follow-up services. Will have supervision for family. Pt motivated to continue ambulating with assist from family and/or RN/NT during hospital admission. Will d/c acute PT.     Follow Up Recommendations No PT follow up;Supervision - Intermittent    Equipment Recommendations  None recommended by PT    Recommendations for Other Services       Precautions / Restrictions Precautions Precautions: Fall Restrictions Weight Bearing Restrictions: No      Mobility  Bed Mobility Overal bed mobility: Modified Independent             General bed mobility comments: HOB elevated; increased time and effort  Transfers Overall transfer level: Needs assistance Equipment used: Rolling walker (2 wheeled);None Transfers: Sit to/from Stand Sit to Stand: Supervision         General transfer comment: Pt initially requesting use of RW; educ on hand placement  Ambulation/Gait Ambulation/Gait assistance: Supervision Ambulation Distance (Feet): 300 Feet Assistive device: Rolling walker (2 wheeled);None Gait Pattern/deviations: Step-through pattern;Decreased stride length Gait velocity: Decreased   General Gait  Details: Slow, controlled amb with RW and supervision for balance. Amb an additional 89' with no DME; pt reaching for intermittent UE support on furniture  Stairs Stairs: Yes Stairs assistance: Supervision Stair Management: One rail Left;Forwards Number of Stairs: 8 General stair comments: Simulated ascending 8 steps into home with rail by high marching with LUE support on rail  Wheelchair Mobility    Modified Rankin (Stroke Patients Only)       Balance Overall balance assessment: Needs assistance   Sitting balance-Leahy Scale: Good       Standing balance-Leahy Scale: Fair                               Pertinent Vitals/Pain Pain Assessment: No/denies pain    Home Living Family/patient expects to be discharged to:: Private residence Living Arrangements: Spouse/significant other;Children Available Help at Discharge: Family;Available 24 hours/day Type of Home: House Home Access: Stairs to enter Entrance Stairs-Rails: Left Entrance Stairs-Number of Steps: 8 Home Layout: Two level;Laundry or work area in basement;Able to live on main level with bedroom/bathroom Home Equipment: Environmental consultant - 2 wheels Additional Comments: Has two highschool-aged kids left at home    Prior Function Level of Independence: Independent         Comments: Retired from work. Drives     Hand Dominance        Extremity/Trunk Assessment   Upper Extremity Assessment Upper Extremity Assessment: Overall WFL for tasks assessed    Lower Extremity Assessment Lower Extremity Assessment: Overall WFL for tasks assessed       Communication   Communication: HOH  Cognition Arousal/Alertness: Awake/alert Behavior During Therapy: WFL for tasks assessed/performed Overall Cognitive  Status: Within Functional Limits for tasks assessed                                        General Comments General comments (skin integrity, edema, etc.): Wife present at beginning of  session. VSS    Exercises     Assessment/Plan    PT Assessment Patent does not need any further PT services  PT Problem List         PT Treatment Interventions      PT Goals (Current goals can be found in the Care Plan section)  Acute Rehab PT Goals Patient Stated Goal: Return home PT Goal Formulation: All assessment and education complete, DC therapy    Frequency     Barriers to discharge        Co-evaluation               AM-PAC PT "6 Clicks" Daily Activity  Outcome Measure Difficulty turning over in bed (including adjusting bedclothes, sheets and blankets)?: None Difficulty moving from lying on back to sitting on the side of the bed? : None Difficulty sitting down on and standing up from a chair with arms (e.g., wheelchair, bedside commode, etc,.)?: A Little Help needed moving to and from a bed to chair (including a wheelchair)?: A Little Help needed walking in hospital room?: A Little Help needed climbing 3-5 steps with a railing? : A Little 6 Click Score: 20    End of Session Equipment Utilized During Treatment: Gait belt Activity Tolerance: Patient tolerated treatment well Patient left: in bed;with call bell/phone within reach Nurse Communication: Mobility status PT Visit Diagnosis: Other abnormalities of gait and mobility (R26.89)    Time: 0109-3235 PT Time Calculation (min) (ACUTE ONLY): 22 min   Charges:   PT Evaluation $PT Eval Moderate Complexity: 1 Mod     PT G Codes:       Mabeline Caras, PT, DPT Acute Rehab Services  Pager: [place] 11/22/2017, 10:19 AM

## 2017-11-23 DIAGNOSIS — N400 Enlarged prostate without lower urinary tract symptoms: Secondary | ICD-10-CM

## 2017-11-23 DIAGNOSIS — D472 Monoclonal gammopathy: Secondary | ICD-10-CM

## 2017-11-23 LAB — GLUCOSE, CAPILLARY
GLUCOSE-CAPILLARY: 87 mg/dL (ref 65–99)
GLUCOSE-CAPILLARY: 191 mg/dL — AB (ref 65–99)
Glucose-Capillary: 144 mg/dL — ABNORMAL HIGH (ref 65–99)
Glucose-Capillary: 198 mg/dL — ABNORMAL HIGH (ref 65–99)

## 2017-11-23 MED ORDER — HYDROCOD POLST-CPM POLST ER 10-8 MG/5ML PO SUER
5.0000 mL | Freq: Two times a day (BID) | ORAL | Status: DC
  Administered 2017-11-23 – 2017-11-24 (×3): 5 mL via ORAL
  Filled 2017-11-23 (×3): qty 5

## 2017-11-23 NOTE — Progress Notes (Signed)
PROGRESS NOTE    Tommy Peterson   NWG:[id number]  DOB: June 11, 1941  DOA: 11/20/2017 PCP: Tommy Gravel, MD   Brief Narrative:  Tommy Peterson Pt. with PMH of Chronic large granular lymphocytic leukemia, diabetes for 25+ years, hypertension, hyperlipidemia, colonic adenoma, chronic diarrhea since at least 2010 (per patient), active colitis on colon biopsies at the time (last colonoscopy on 05/20/2014 colon appeared normal) on budenoside 6 mg/day, history of C. difficile diarrhea fall 2017, prostate surgery in 2009, MGUS, transfusion dependent anemia; admitted on4/16/2019, presented with complaint of nausea vomiting and diarrhea patient has been telling me that he has had URI type symptoms for the past 1-2 weeks. He was seen in the ED last week and was given Omnicef.  He finished that course however still feels slight pleuritic type chest pain on especially on the left side with deep inspiration.  He is also been complaining of significant dysuria and increased frequency, he was treated for UTI with ciprofloxacin and Levaquin couple of times in the last month without any improvement in his symptoms.   Subjective: Having pain- points to left lower abdomen and tells me it is coming from his pneumonia. Overall he feels much more stronger than before and was able to walk the hall.  ROS: no complaints of nausea, vomiting, constipation diarrhea, cough, dyspnea or dysuria. No other complaints.   Assessment & Plan:   Principal Problem:   Sepsis  BPH with urinary urgency  -Is supposed to see Dr. Jeffie Peterson in couple weeks -sepsis suspected to be due to UTI with stap epi resistant to the flouroquinolones he was receiving - started on Doxy yesterday and is feeling better - blood culture negative  Active Problems: Cough/  Left lower lobe pneumonia -Influenza panel was negative - he is coughing when I examine him- sounds like a dry cough- his wife wants me to give him something to cough up some mucous and  feels her pneumonia will not get better otherwise - CXR 4/4 > Chronic interstitial changes with new patchy left basilar airspace opacity suspicious for pneumonia. - CT abd/pelvis> Mild left basilar subsegmental atelectasis or infiltrate. - has been started on Doxy as mentioned above- follow on this for now  Nausea/vomiting/diarrhea -Patient with chronic diarrhea- states that this is normal for him and nothing was out of the ordinary -Nausea vomiting likely due to UTI -C. difficile, GI pathogen panel were obtained and they were negative - abdomen is non-tender today  Chronic collagenous colitis -Continue Entocort  Type 2 diabetes mellitus -Poorly controlled, A1c 8.5, continue Lantus and sliding scale  MGUS, chronic granular lymphocytic leukemia -Hold methotrexate, closely monitor hemoglobin, currently stable -He is being followed at [facility] - very weak on walking- asked to ambulate multiple times today   DVT prophylaxis: Lovenox Code Status: Full code Family Communication: wife Disposition Plan: home hopefully tomorrow Consultants:   none Procedures:   none Antimicrobials:  Anti-infectives (From admission, onward)   Start     Dose/Rate Route Frequency Ordered Stop   11/22/17 0830  doxycycline (VIBRAMYCIN) 100 mg in sodium chloride 0.9 % 250 mL IVPB     100 mg 125 mL/hr over 120 Minutes Intravenous Every 12 hours 11/22/17 0822     11/21/17 1500  vancomycin (VANCOCIN) 1,250 mg in sodium chloride 0.9 % 250 mL IVPB  Status:  Discontinued     1,250 mg 166.7 mL/hr over 90 Minutes Intravenous Every 24 hours 11/20/17 1455 11/21/17 1110   11/21/17 1415  metroNIDAZOLE (FLAGYL) tablet 500  mg  Status:  Discontinued     500 mg Oral Every 8 hours 11/21/17 1408 11/22/17 0822   11/21/17 0600  vancomycin (VANCOCIN) 1,250 mg in sodium chloride 0.9 % 250 mL IVPB  Status:  Discontinued     1,250 mg 166.7 mL/hr over 90 Minutes Intravenous Every 24 hours 11/20/17 0708 11/20/17  0751   11/20/17 1000  cefTRIAXone (ROCEPHIN) 1 g in sodium chloride 0.9 % 100 mL IVPB  Status:  Discontinued     1 g 200 mL/hr over 30 Minutes Intravenous Daily 11/20/17 0642 11/22/17 0822   11/20/17 0800  metroNIDAZOLE (FLAGYL) IVPB 500 mg  Status:  Discontinued     500 mg 100 mL/hr over 60 Minutes Intravenous Every 8 hours 11/20/17 0642 11/21/17 1110   11/20/17 0215  piperacillin-tazobactam (ZOSYN) IVPB 3.375 g     3.375 g 100 mL/hr over 30 Minutes Intravenous  Once 11/20/17 0209 11/20/17 0339   11/20/17 0215  vancomycin (VANCOCIN) IVPB 1000 mg/200 mL premix  Status:  Discontinued     1,000 mg 200 mL/hr over 60 Minutes Intravenous  Once 11/20/17 0209 11/20/17 0210   11/20/17 0215  vancomycin (VANCOCIN) 1,500 mg in sodium chloride 0.9 % 500 mL IVPB     1,500 mg 250 mL/hr over 120 Minutes Intravenous  Once 11/20/17 0210 11/20/17 0606       Objective: Vitals:   11/23/17 0405 11/23/17 0752 11/23/17 1214 11/23/17 1607  BP: 134/67 (!) 145/63 (!) 153/71 (!) 142/73  Pulse: 67 66 87 63  Resp: 17 (!) 21 (!) 21 (!) 22  Temp: 98.3 F (36.8 C) 97.9 F (36.6 C) 98 F (36.7 C) 97.7 F (36.5 C)  TempSrc: Oral Oral Oral Oral  SpO2: 97% 91% 93% 93%  Weight: 79.2 kg (174 lb 9.7 oz)     Height:        Intake/Output Summary (Last 24 hours) at 11/23/2017 1714 Last data filed at 11/23/2017 1230 Gross per 24 hour  Intake 1915 ml  Output 4000 ml  Net -2085 ml   Filed Weights   11/21/17 0000 11/22/17 0524 11/23/17 0405  Weight: 75.9 kg (167 lb 6.4 oz) 75 kg (165 lb 4.8 oz) 79.2 kg (174 lb 9.7 oz)    Examination: General exam: Appears comfortable  HEENT: PERRLA, oral mucosa moist, no sclera icterus or thrush Respiratory system: Clear to auscultation. Respiratory effort normal. Cardiovascular system: S1 & S2 heard, RRR.   Gastrointestinal system: Abdomen soft, non-tender, nondistended. Normal bowel sound. No organomegaly Central nervous system: Alert and oriented. No focal neurological  deficits. Extremities: No cyanosis, clubbing or edema Skin: No rashes or ulcers Psychiatry:  Mood & affect appropriate.     Data Reviewed: I have personally reviewed following labs and imaging studies  CBC: Recent Labs  Lab 11/20/17 0259 11/21/17 0800 11/22/17 0346  WBC 17.5* 11.2* 10.1  NEUTROABS 15.3*  --  5.2  HGB 9.7* 7.9* 8.0*  HCT 29.6* 24.1* 25.2*  MCV 114.7* 115.9* 115.6*  PLT 420* 359 213*   Basic Metabolic Panel: Recent Labs  Lab 11/20/17 0259 11/21/17 0800 11/22/17 0346  NA 136 138 139  K 4.3 3.5 3.3*  CL 104 112* 113*  CO2 21* 20* 19*  GLUCOSE 329* 171* 155*  BUN 11 10 7   CREATININE 0.87 0.59* 0.53*  CALCIUM 9.0 7.9* 8.0*  MG  --   --  1.9   GFR: Estimated Creatinine Clearance: 78.6 mL/min (A) (by C-G formula based on SCr of 0.53 mg/dL (L)). Liver  Function Tests: Recent Labs  Lab 11/20/17 0259 11/21/17 0800 11/22/17 0346  AST 21 27 21   ALT 20 28 31   ALKPHOS 73 53 51  BILITOT 1.1 0.7 0.5  PROT 6.2* 5.0* 5.0*  ALBUMIN 3.4* 2.6* 2.6*   No results for input(s): LIPASE, AMYLASE in the last 168 hours. No results for input(s): AMMONIA in the last 168 hours. Coagulation Profile: No results for input(s): INR, PROTIME in the last 168 hours. Cardiac Enzymes: No results for input(s): CKTOTAL, CKMB, CKMBINDEX, TROPONINI in the last 168 hours. BNP (last 3 results) No results for input(s): PROBNP in the last 8760 hours. HbA1C: Recent Labs    11/21/17 0412  HGBA1C 8.5*   CBG: Recent Labs  Lab 11/22/17 1639 11/22/17 2023 11/23/17 0750 11/23/17 1121 11/23/17 1604  GLUCAP 196* 160* 87 144* 198*   Lipid Profile: No results for input(s): CHOL, HDL, LDLCALC, TRIG, CHOLHDL, LDLDIRECT in the last 72 hours. Thyroid Function Tests: No results for input(s): TSH, T4TOTAL, FREET4, T3FREE, THYROIDAB in the last 72 hours. Anemia Panel: No results for input(s): VITAMINB12, FOLATE, FERRITIN, TIBC, IRON, RETICCTPCT in the last 72 hours. Urine analysis:      Component Value Date/Time   COLORURINE YELLOW 11/20/2017 0303   APPEARANCEUR CLEAR 11/20/2017 0303   LABSPEC 1.011 11/20/2017 0303   PHURINE 7.0 11/20/2017 0303   GLUCOSEU >=500 (A) 11/20/2017 0303   HGBUR NEGATIVE 11/20/2017 0303   BILIRUBINUR NEGATIVE 11/20/2017 0303   BILIRUBINUR negative 10/29/2017 1428   KETONESUR 5 (A) 11/20/2017 0303   PROTEINUR NEGATIVE 11/20/2017 0303   UROBILINOGEN 0.2 10/29/2017 1428   NITRITE NEGATIVE 11/20/2017 0303   LEUKOCYTESUR TRACE (A) 11/20/2017 0303   Sepsis Labs: @LABRCNTIP (procalcitonin:4,lacticidven:4) ) Recent Results (from the past 240 hour(s))  Blood Culture (routine x 2)     Status: None (Preliminary result)   Collection Time: 11/20/17  2:50 AM  Result Value Ref Range Status   Specimen Description BLOOD LEFT HAND  Final   Special Requests   Final    BOTTLES DRAWN AEROBIC AND ANAEROBIC Blood Culture adequate volume   Culture   Final    NO GROWTH 3 DAYS Performed at [facility], [place] [address], [place], [place] [address]    Report Status PENDING  Incomplete  Blood Culture (routine x 2)     Status: None (Preliminary result)   Collection Time: 11/20/17  2:57 AM  Result Value Ref Range Status   Specimen Description BLOOD RIGHT FOREARM  Final   Special Requests   Final    BOTTLES DRAWN AEROBIC AND ANAEROBIC Blood Culture adequate volume   Culture   Final    NO GROWTH 3 DAYS Performed at [facility], [address] [place], [place] [address]    Report Status PENDING  Incomplete  Culture, Urine     Status: Abnormal   Collection Time: 11/20/17  2:57 AM  Result Value Ref Range Status   Specimen Description URINE, RANDOM  Final   Special Requests   Final    NONE Performed at [facility], [place] [address], [place], [place] [address]    Culture >=100,000 COLONIES/mL STAPHYLOCOCCUS EPIDERMIDIS (A)  Final   Report Status 11/22/2017 FINAL  Final   Organism ID, Bacteria STAPHYLOCOCCUS EPIDERMIDIS (A)  Final       Susceptibility   Staphylococcus epidermidis - MIC*    CIPROFLOXACIN >=8 RESISTANT Resistant     GENTAMICIN <=0.5 SENSITIVE Sensitive     NITROFURANTOIN <=16 SENSITIVE Sensitive  OXACILLIN >=4 RESISTANT Resistant     TETRACYCLINE 2 SENSITIVE Sensitive     VANCOMYCIN 1 SENSITIVE Sensitive     TRIMETH/SULFA 160 RESISTANT Resistant     CLINDAMYCIN RESISTANT Resistant     RIFAMPIN <=0.5 SENSITIVE Sensitive     Inducible Clindamycin POSITIVE Resistant     * >=100,000 COLONIES/mL STAPHYLOCOCCUS EPIDERMIDIS  C difficile quick scan w PCR reflex     Status: None   Collection Time: 11/20/17  4:30 AM  Result Value Ref Range Status   C Diff antigen NEGATIVE NEGATIVE Final   C Diff toxin NEGATIVE NEGATIVE Final   C Diff interpretation No C. difficile detected.  Final    Comment: Performed at [facility], [place] [address], [place], [place] [address]  Gastrointestinal Panel by PCR , Stool     Status: None   Collection Time: 11/20/17  4:30 AM  Result Value Ref Range Status   Campylobacter species NOT DETECTED NOT DETECTED Final   Plesimonas shigelloides NOT DETECTED NOT DETECTED Final   Salmonella species NOT DETECTED NOT DETECTED Final   Yersinia enterocolitica NOT DETECTED NOT DETECTED Final   Vibrio species NOT DETECTED NOT DETECTED Final   Vibrio cholerae NOT DETECTED NOT DETECTED Final   Enteroaggregative E coli (EAEC) NOT DETECTED NOT DETECTED Final   Enteropathogenic E coli (EPEC) NOT DETECTED NOT DETECTED Final   Enterotoxigenic E coli (ETEC) NOT DETECTED NOT DETECTED Final   Shiga like toxin producing E coli (STEC) NOT DETECTED NOT DETECTED Final   Shigella/Enteroinvasive E coli (EIEC) NOT DETECTED NOT DETECTED Final   Cryptosporidium NOT DETECTED NOT DETECTED Final   Cyclospora cayetanensis NOT DETECTED NOT DETECTED Final   Entamoeba histolytica NOT DETECTED NOT DETECTED Final   Giardia lamblia NOT DETECTED NOT DETECTED Final   Adenovirus F40/41 NOT DETECTED NOT DETECTED Final     Astrovirus NOT DETECTED NOT DETECTED Final   Norovirus GI/GII NOT DETECTED NOT DETECTED Final   Rotavirus A NOT DETECTED NOT DETECTED Final   Sapovirus (I, II, IV, and V) NOT DETECTED NOT DETECTED Final    Comment: Performed at [facility], [address] [place], [place] [address]  MRSA PCR Screening     Status: None   Collection Time: 11/21/17 12:37 AM  Result Value Ref Range Status   MRSA by PCR NEGATIVE NEGATIVE Final    Comment:        The GeneXpert MRSA Assay (FDA approved for NASAL specimens only), is one component of a comprehensive MRSA colonization surveillance program. It is not intended to diagnose MRSA infection nor to guide or monitor treatment for MRSA infections. Performed at [facility], [place] [address], [place], [place] [address]          Radiology Studies: No results found.    Scheduled Meds: . budesonide  9 mg Oral Daily  . chlorpheniramine-HYDROcodone  5 mL Oral Q12H  . enoxaparin (LOVENOX) injection  40 mg Subcutaneous Daily  . insulin aspart  0-15 Units Subcutaneous TID WC  . insulin aspart  0-5 Units Subcutaneous QHS  . insulin glargine  15 Units Subcutaneous QHS  . tamsulosin  0.4 mg Oral Daily   Continuous Infusions: . sodium chloride 75 mL/hr at 11/23/17 0128  . doxycycline (VIBRAMYCIN) IV Stopped (11/23/17 1027)     LOS: 3 days    Time spent in minutes: 35    Debbe Odea, MD [facility] Pager: [email or web address] Password [email or web address] 11/23/2017, 5:14 PM

## 2017-11-24 DIAGNOSIS — A419 Sepsis, unspecified organism: Principal | ICD-10-CM

## 2017-11-24 LAB — RETICULOCYTES
RBC.: 2.39 MIL/uL — ABNORMAL LOW (ref 4.22–5.81)
RETIC COUNT ABSOLUTE: 50.2 10*3/uL (ref 19.0–186.0)
Retic Ct Pct: 2.1 % (ref 0.4–3.1)

## 2017-11-24 LAB — IRON AND TIBC
IRON: 122 ug/dL (ref 45–182)
SATURATION RATIOS: 69 % — AB (ref 17.9–39.5)
TIBC: 176 ug/dL — ABNORMAL LOW (ref 250–450)
UIBC: 54 ug/dL

## 2017-11-24 LAB — GLUCOSE, CAPILLARY
Glucose-Capillary: 87 mg/dL (ref 65–99)
Glucose-Capillary: 149 mg/dL — ABNORMAL HIGH (ref 65–99)

## 2017-11-24 LAB — FERRITIN: Ferritin: 856 ng/mL — ABNORMAL HIGH (ref 24–336)

## 2017-11-24 LAB — FOLATE: FOLATE: 9.6 ng/mL (ref >5.9)

## 2017-11-24 LAB — VITAMIN B12: VITAMIN B 12: 493 pg/mL (ref 180–914)

## 2017-11-24 MED ORDER — CYANOCOBALAMIN 500 MCG PO TABS: 500.0000 ug | ORAL_TABLET | Freq: Every day | ORAL | 0 refills | Status: DC

## 2017-11-24 MED ORDER — FOLIC ACID 1 MG PO TABS: 1.0000 mg | ORAL_TABLET | Freq: Every day | ORAL | 0 refills | Status: DC

## 2017-11-24 MED ORDER — DOXYCYCLINE HYCLATE 100 MG PO TABS: 100.0000 mg | ORAL_TABLET | Freq: Two times a day (BID) | ORAL | 0 refills | Status: DC

## 2017-11-24 MED ORDER — ACETAMINOPHEN 325 MG PO TABS: 650.0000 mg | ORAL_TABLET | Freq: Four times a day (QID) | ORAL | Status: DC | PRN

## 2017-11-24 NOTE — Discharge Summary (Addendum)
Tommy Peterson, is a 77 y.o. male  DOB [birth year]  MRN [id number].  Admission date:  11/20/2017  Admitting Physician  Etta Quill, DO  Discharge Date:  11/24/2017   Primary MD  Jani Gravel, MD  Recommendations for primary care physician for things to follow:  - please check CBC, BMP during next visit. -  repeat chest x-ray in 3-4 weeks to ensure resolution of pneumonia - Please follow on final result W-09 and folic acid for workup of macrocytic anemia.   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 Anemia   . Cataract   . Diabetes mellitus without complication ([place])   . Hypertension   . Leukemia, lymphocytic ([place])   . Macrocytosis   . Monoclonal gammopathy     Past Surgical History:  Procedure Laterality Date  . BONE MARROW BIOPSY Right 2016  . CATARACT EXTRACTION     bilateral  . HERNIA REPAIR     right inguinal  . PENILE PROSTHESIS IMPLANT    . PROSTATE SURGERY     BPH  . TONSILLECTOMY         History of present illness and  Hospital Course:     Kindly see H&P for history of present illness and admission details, please review complete Labs, Consult reports and Test reports for all details in brief  HPI  from the history and physical done on the day of admission HPI: Tommy Peterson is a 77 y.o. male with medical history significant of LGL leukemia on MTX therapy.  Patient presents to  ED with cough, fever, N/V/D.  Cough has been ongoing for the past couple of weeks.  Has been on multiple ABx recently for this, most recently a course of omnicef this past week started on 4/4 after imaging showed LLL PNA.  Yesterday developed diarrhea, and up to 10 episodes of vomiting.   ED Course: WBC 17.x.  Tm 104.x.  Lactate 3.1, clears to 1.2 after 2.5L NS bolus.  Put on empiric zosyn and vanc in ED.  Stool sent for C.Diff.  CXR cut off the lung bases so the known LLL PNA couldn't be seen on today's study.       [place] Pt. with PMH of Chronic large granular lymphocytic leukemia, diabetes for 25+ years, hypertension, hyperlipidemia, colonic adenoma, chronic diarrhea since at least 2010 (per patient), active colitis on colon biopsies at the time (last colonoscopy on10/14/2015 colon appeared normal) on budenoside 6 mg/day, history of C. difficile diarrhea fall 2017,prostate surgery in 2009, MGUS,  transfusion dependent anemia; admitted on4/16/2019, presented with complaint of nausea vomiting and diarrheapatient has been telling me that he has had URI type symptoms for the past 1-2 weeks. He was seen in the ED last week and was given Omnicef. He finished that course however still feels slight pleuritic type chest pain on especially on the left side with deep inspiration.He is also been complaining of significant dysuria and increased frequency, he was treated for UTI with ciprofloxacin and Levaquin couple of times in the last month without any improvement in his symptoms.   Sepsis  BPH with urinary urgency  -Is supposed to see Dr.Wrennin couple weeks -sepsis suspected to be due to UTI with stap epi resistant to the flouroquinolones which she was receiving, he was transiently doxycycline, with significant improvement of his symptoms, he will be discharged on another a days of oral doxycycline to finish total of 10 days treatment. - blood culture  negative   Cough/  Left lower lobe pneumonia -Influenza panel was negative - he is coughing when I examine him- sounds like a dry cough- his wife wants me to give him something to cough up some mucous and feels her pneumonia will not get better otherwise - CXR 4/4 > Chronic interstitial changes with new patchy left basilar airspace opacity suspicious for pneumonia. - CT abd/pelvis> Mild left basilar subsegmental atelectasis or infiltrate. - To continue doxycycline as an outpatient  Nausea/vomiting/diarrhea -Patient with chronic diarrhea- states that this is normal for him and nothing was out of the ordinary -Nausea vomiting likely due to UTI -C. difficile, GI pathogen panel were obtained and they were negative - abdomen is non-tendertoday, no further nausea or vomiting  Chronic collagenous colitis -Continue Entocort  Type 2 diabetes mellitus -Poorly controlled, A1c 8.5, resume all meds  MGUS, chronic granular lymphocytic leukemia -resume home meds -[facility]   Macrocytic  Anemia - microcytic, MCV 115, unclear as to methotrexate or his leukemia, but I have sent anemia panel before discharge, this is to be followed by PCP as an outpatient, he will be discharged on K-53 and folic acid supplement      Discharge Condition: stable   Follow UP      Discharge Instructions  and  Discharge Medications     Discharge Instructions    Diet - low sodium heart healthy   Complete by:  As directed    Discharge instructions   Complete by:  As directed    Follow with Primary MD Jani Gravel, MD in 7 days   Get CBC, CMP, 2 view Chest X ray checked  by Primary MD next visit.    Activity: As tolerated with Full fall precautions use walker/cane & assistance as needed   Disposition Home    Diet: soft, carb modified , with feeding assistance and aspiration precautions.  For Heart failure patients - Check your Weight same time everyday, if you gain over 2  pounds, or you develop in leg swelling, experience more shortness of breath or chest pain, call your Primary MD immediately. Follow Cardiac Low Salt Diet and 1.5 lit/day fluid restriction.   On your next visit with your primary care physician please Get Medicines reviewed and adjusted.   Please request your Prim.MD to go over all Hospital Tests and Procedure/Radiological results at the follow up, please get all Hospital records sent to your Prim MD by signing hospital release before you go home.   If you experience worsening of your admission symptoms, develop shortness of breath,  life threatening emergency, suicidal or homicidal thoughts you must seek medical attention immediately by calling 911 or calling your MD immediately  if symptoms less severe.  You Must read complete instructions/literature along with all the possible adverse reactions/side effects for all the Medicines you take and that have been prescribed to you. Take any new Medicines after you have completely understood and accpet all the possible adverse reactions/side effects.   Do not drive, operating heavy machinery, perform activities at heights, swimming or participation in water activities or provide baby sitting services if your were admitted for syncope or siezures until you have seen by Primary MD or a Neurologist and advised to do so again.  Do not drive when taking Pain medications.    Do not take more than prescribed Pain, Sleep and Anxiety Medications  Special Instructions: If you have smoked or chewed Tobacco  in the last 2 yrs please stop smoking, stop any regular Alcohol  and or any Recreational drug use.  Wear Seat belts while driving.   Please note  You were cared for by a hospitalist during your hospital stay. If you have any questions about your discharge medications or the care you received while you were in the hospital after you are discharged, you can call the unit and asked to speak with the  hospitalist on call if the hospitalist that took care of you is not available. Once you are discharged, your primary care physician will handle any further medical issues. Please note that NO REFILLS for any discharge medications will be authorized once you are discharged, as it is imperative that you return to your primary care physician (or establish a relationship with a primary care physician if you do not have one) for your aftercare needs so that they can reassess your need for medications and monitor your lab values.   Increase activity slowly   Complete by:  As directed      Allergies as of 11/24/2017      Reactions   Bee Venom Swelling   Metformin And Related Nausea And Vomiting   Sulfamethoxazole-trimethoprim Nausea And Vomiting      Medication List    STOP taking these medications   cefdinir 300 MG capsule Commonly known as:  OMNICEF     TAKE these medications   acetaminophen 325 MG tablet Commonly known as:  TYLENOL Take 2 tablets (650 mg total) by mouth every 6 (six) hours as needed for mild pain (or Fever >/= 101).   budesonide 3 MG 24 hr capsule Commonly known as:  ENTOCORT EC Take 9 mg by mouth daily.   doxycycline 100 MG tablet Commonly known as:  VIBRA-TABS Take 1 tablet (100 mg total) by mouth 2 (two) times daily.   folic acid 1 MG tablet Commonly known as:  FOLVITE Take 1 tablet (1 mg total) by mouth daily.   methotrexate 2.5 MG tablet Commonly known as:  RHEUMATREX Take 30 mg by mouth once a week.   oxybutynin 5 MG tablet Commonly known as:  DITROPAN Take 5 mg by mouth 3 (three) times daily.   pioglitazone 15 MG tablet Commonly known as:  ACTOS Take 15 mg by mouth daily.   SOLIQUA 100-33 UNT-MCG/ML Sopn Generic drug:  Insulin Glargine-Lixisenatide Inject 28 Units into the skin at bedtime.   tamsulosin 0.4 MG Caps capsule Commonly known as:  FLOMAX Take 0.4 mg by mouth daily.   triamcinolone cream 0.1 % Commonly known as:  KENALOG Apply 1  application topically 2 (  two) times daily.   vitamin B-12 500 MCG tablet Commonly known as:  CYANOCOBALAMIN Take 1 tablet (500 mcg total) by mouth daily.         Diet and Activity recommendation: See Discharge Instructions above   Consults obtained -none   Major procedures and Radiology Reports - PLEASE review detailed and final reports for all details, in brief -      Dg Chest 2 View  Result Date: 11/08/2017 CLINICAL DATA:  Cough. EXAM: CHEST - 2 VIEW COMPARISON:  05/02/2017 FINDINGS: The cardiomediastinal silhouette is unchanged. The heart is normal in size. Aortic atherosclerosis is noted. The patient has taken a slightly shallower inspiration than on the prior study. There is chronic peribronchial thickening and interstitial coarsening which are stable to mildly increased from the prior study. New patchy airspace opacity is present in the left lung base. There is mild biapical scarring. Trace pleural effusions are questioned bilaterally. No pneumothorax is identified. Old left rib fractures are noted. IMPRESSION: Chronic interstitial changes with new patchy left basilar airspace opacity suspicious for pneumonia. Electronically Signed   By: Logan Bores M.D.   On: 11/08/2017 10:50   Ct Abdomen Pelvis W Contrast  Result Date: 11/08/2017 CLINICAL DATA:  Acute left lower quadrant abdominal pain. EXAM: CT ABDOMEN AND PELVIS WITH CONTRAST TECHNIQUE: Multidetector CT imaging of the abdomen and pelvis was performed using the standard protocol following bolus administration of intravenous contrast. CONTRAST:  181m ISOVUE-300 IOPAMIDOL (ISOVUE-300) INJECTION 61% COMPARISON:  CT scan of August 01, 2015. FINDINGS: Lower chest: Mild left basilar subsegmental atelectasis or infiltrate is noted. Hepatobiliary: No focal liver abnormality is seen. No gallstones, gallbladder wall thickening, or biliary dilatation. Pancreas: Unremarkable. No pancreatic ductal dilatation or surrounding inflammatory  changes. Spleen: Normal in size without focal abnormality. Adrenals/Urinary Tract: Adrenal glands are unremarkable. Kidneys are normal, without renal calculi, focal lesion, or hydronephrosis. Bladder is unremarkable. Stomach/Bowel: The stomach appears normal. The appendix is unremarkable. There is no evidence of bowel obstruction. There is no evidence of inflammation. Vascular/Lymphatic: Aortic atherosclerosis. No enlarged abdominal or pelvic lymph nodes. Reproductive: Stable mild prostatic enlargement is noted with associated calcifications. Other: No abdominal wall hernia or abnormality. No abdominopelvic ascites. Penile prosthesis reservoir is noted in left lower quadrant. Musculoskeletal: No acute or significant osseous findings. IMPRESSION: Mild left basilar subsegmental atelectasis or infiltrate. Stable mild prostatic enlargement. No acute abnormality seen in the abdomen or pelvis. Aortic Atherosclerosis (ICD10-I70.0). Electronically Signed   By: JMarijo Conception M.D.   On: 11/08/2017 12:41   Dg Chest Portable 1 View  Result Date: 11/20/2017 CLINICAL DATA:  Cough, nausea, vomiting and diarrhea. EXAM: PORTABLE CHEST 1 VIEW COMPARISON:  Chest radiograph November 08, 2017 FINDINGS: LEFT greater than RIGHT lung bases out of field-of-view. The cardiac silhouette is mildly enlarged and unchanged. Calcified aortic knob. Diffuse interstitial prominence confluent in the lung bases is similar. Limited assessment for pleural effusion. Biapical pleural thickening. No pneumothorax. Soft tissue planes and included osseous structures are nonsuspicious. IMPRESSION: Lung bases out of field-of-view, cannot assess known LEFT lower lobe pneumonia. Similar chronic interstitial changes. Aortic Atherosclerosis (ICD10-I70.0). Electronically Signed   By: CElon AlasM.D.   On: 11/20/2017 03:29   Dg Abd Acute W/chest  Result Date: 11/21/2017 CLINICAL DATA:  Abdominal pain, diarrhea EXAM: DG ABDOMEN ACUTE W/ 1V CHEST  COMPARISON:  Abdomen films of 11/20/2016, chest x-ray 11/20/2017, and CT abdomen pelvis of 11/08/2017 FINDINGS: Small pleural effusions are again noted, left slightly larger than right. The heart  is mildly enlarged and there may be very mild pulmonary vascular congestion present. There are somewhat prominent markings overlying the heart shadow medially at the left lung base and patchy pneumonia cannot be excluded. Supine and erect views of the abdomen show a moderate amount of air throughout the colon with some small bowel gas as well. Some edema of colon cannot be excluded on the erect view in the region of the high positioned sigmoid colon. No free air is seen on the erect view. No opaque calculi are noted. The bones are unremarkable. IMPRESSION: 1. Small bilateral pleural effusions left greater than right. Cannot exclude left lower lobe pneumonia. 2. No bowel obstruction, but edema probably of the colon on the erect view in the region of the sigmoid colon cannot be excluded. Electronically Signed   By: Ivar Drape M.D.   On: 11/21/2017 12:19   Dg Abd Portable 1v  Result Date: 11/20/2017 CLINICAL DATA:  Abdominal pain EXAM: PORTABLE ABDOMEN - 1 VIEW COMPARISON:  Abdominal CT from 12 days ago FINDINGS: Moderate gas in the transverse colon. Overall nonobstructive bowel gas pattern. No concerning mass effect or gas collection. Mild streaky densities at the bases, reference contemporaneous chest x-ray. IMPRESSION: Nonobstructive bowel gas pattern. Electronically Signed   By: Monte Fantasia M.D.   On: 11/20/2017 14:29    Micro Results     Recent Results (from the past 240 hour(s))  Blood Culture (routine x 2)     Status: None (Preliminary result)   Collection Time: 11/20/17  2:50 AM  Result Value Ref Range Status   Specimen Description BLOOD LEFT HAND  Final   Special Requests   Final    BOTTLES DRAWN AEROBIC AND ANAEROBIC Blood Culture adequate volume   Culture   Final    NO GROWTH 3  DAYS Performed at [facility], [address], [place], [place] [address]    Report Status PENDING  Incomplete  Blood Culture (routine x 2)     Status: None (Preliminary result)   Collection Time: 11/20/17  2:57 AM  Result Value Ref Range Status   Specimen Description BLOOD RIGHT FOREARM  Final   Special Requests   Final    BOTTLES DRAWN AEROBIC AND ANAEROBIC Blood Culture adequate volume   Culture   Final    NO GROWTH 3 DAYS Performed at [facility], [address], [place], [place] [address]    Report Status PENDING  Incomplete  Culture, Urine     Status: Abnormal   Collection Time: 11/20/17  2:57 AM  Result Value Ref Range Status   Specimen Description URINE, RANDOM  Final   Special Requests   Final    NONE Performed at [facility], [place] [address] [place], [place] [address]    Culture >=100,000 COLONIES/mL STAPHYLOCOCCUS EPIDERMIDIS (A)  Final   Report Status 11/22/2017 FINAL  Final   Organism ID, Bacteria STAPHYLOCOCCUS EPIDERMIDIS (A)  Final      Susceptibility   Staphylococcus epidermidis - MIC*    CIPROFLOXACIN >=8 RESISTANT Resistant     GENTAMICIN <=0.5 SENSITIVE Sensitive     NITROFURANTOIN <=16 SENSITIVE Sensitive     OXACILLIN >=4 RESISTANT Resistant     TETRACYCLINE 2 SENSITIVE Sensitive     VANCOMYCIN 1 SENSITIVE Sensitive     TRIMETH/SULFA 160 RESISTANT Resistant     CLINDAMYCIN RESISTANT Resistant     RIFAMPIN <=0.5 SENSITIVE Sensitive     Inducible Clindamycin POSITIVE Resistant     * >=  100,000 COLONIES/mL STAPHYLOCOCCUS EPIDERMIDIS  C difficile quick scan w PCR reflex     Status: None   Collection Time: 11/20/17  4:30 AM  Result Value Ref Range Status   C Diff antigen NEGATIVE NEGATIVE Final   C Diff toxin NEGATIVE NEGATIVE Final   C Diff interpretation No C. difficile detected.  Final    Comment: Performed at [facility], [place] [address], [place], [place] [address]  Gastrointestinal Panel by PCR , Stool     Status: None    Collection Time: 11/20/17  4:30 AM  Result Value Ref Range Status   Campylobacter species NOT DETECTED NOT DETECTED Final   Plesimonas shigelloides NOT DETECTED NOT DETECTED Final   Salmonella species NOT DETECTED NOT DETECTED Final   Yersinia enterocolitica NOT DETECTED NOT DETECTED Final   Vibrio species NOT DETECTED NOT DETECTED Final   Vibrio cholerae NOT DETECTED NOT DETECTED Final   Enteroaggregative E coli (EAEC) NOT DETECTED NOT DETECTED Final   Enteropathogenic E coli (EPEC) NOT DETECTED NOT DETECTED Final   Enterotoxigenic E coli (ETEC) NOT DETECTED NOT DETECTED Final   Shiga like toxin producing E coli (STEC) NOT DETECTED NOT DETECTED Final   Shigella/Enteroinvasive E coli (EIEC) NOT DETECTED NOT DETECTED Final   Cryptosporidium NOT DETECTED NOT DETECTED Final   Cyclospora cayetanensis NOT DETECTED NOT DETECTED Final   Entamoeba histolytica NOT DETECTED NOT DETECTED Final   Giardia lamblia NOT DETECTED NOT DETECTED Final   Adenovirus F40/41 NOT DETECTED NOT DETECTED Final   Astrovirus NOT DETECTED NOT DETECTED Final   Norovirus GI/GII NOT DETECTED NOT DETECTED Final   Rotavirus A NOT DETECTED NOT DETECTED Final   Sapovirus (I, II, IV, and V) NOT DETECTED NOT DETECTED Final    Comment: Performed at [facility], [place] [address] [place], [place] [address]  MRSA PCR Screening     Status: None   Collection Time: 11/21/17 12:37 AM  Result Value Ref Range Status   MRSA by PCR NEGATIVE NEGATIVE Final    Comment:        The GeneXpert MRSA Assay (FDA approved for NASAL specimens only), is one component of a comprehensive MRSA colonization surveillance program. It is not intended to diagnose MRSA infection nor to guide or monitor treatment for MRSA infections. Performed at [facility], [place] [address] [place], [place] [address]        Today   Subjective:   Tommy Peterson today has no headache,no chest orabdominal pain,reports some intermittent dry  cough, feels much better wants to go home today.   Objective:   Blood pressure (!) 148/63, pulse 63, temperature 98.5 F (36.9 C), temperature source Oral, resp. rate 20, height 5' 9.5" (1.765 m), weight 79.2 kg (174 lb 9.7 oz), SpO2 92 %.   Intake/Output Summary (Last 24 hours) at 11/24/2017 1209 Last data filed at 11/24/2017 1100 Gross per 24 hour  Intake 1200 ml  Output 3725 ml  Net -2525 ml    Exam Awake Alert, Oriented x 3, No new F.N deficits, Normal affect Supple Neck,No JVD, No cervical lymphadenopathy appriciated.  Symmetrical Chest wall movement, Good air movement bilaterally, CTAB RRR,No Gallops,Rubs or new Murmurs, No Parasternal Heave +ve B.Sounds, Abd Soft, Non tender,No rebound -guarding or rigidity. No Cyanosis, Clubbing or edema, No new Rash or bruise  Data Review   CBC w Diff:  Lab Results  Component Value Date   WBC 10.1 11/22/2017   HGB 8.0 (L) 11/22/2017   HCT  25.2 (L) 11/22/2017   PLT 425 (H) 11/22/2017   LYMPHOPCT 33 11/22/2017   MONOPCT 14 11/22/2017   EOSPCT 2 11/22/2017   BASOPCT 0 11/22/2017    CMP:  Lab Results  Component Value Date   NA 139 11/22/2017   NA 139 10/29/2017   K 3.3 (L) 11/22/2017   CL 113 (H) 11/22/2017   CO2 19 (L) 11/22/2017   BUN 7 11/22/2017   BUN 8 10/29/2017   CREATININE 0.53 (L) 11/22/2017   PROT 5.0 (L) 11/22/2017   PROT 6.0 12/26/2016   ALBUMIN 2.6 (L) 11/22/2017   ALBUMIN 3.5 12/26/2016   BILITOT 0.5 11/22/2017   BILITOT 0.7 12/26/2016   ALKPHOS 51 11/22/2017   AST 21 11/22/2017   ALT 31 11/22/2017  .   Total Time in preparing paper work, data evaluation and todays exam - 33 minutes  Phillips Climes M.D on 11/24/2017 at 12:09 PM  [facility]   Office  [phone]

## 2017-11-24 NOTE — Discharge Instructions (Signed)
Follow with Primary MD Jani Gravel, MD in 7 days   Get CBC, CMP, 2 view Chest X ray checked  by Primary MD next visit.    Activity: As tolerated with Full fall precautions use walker/cane & assistance as needed   Disposition Home    Diet: soft, carb modified , with feeding assistance and aspiration precautions.  For Heart failure patients - Check your Weight same time everyday, if you gain over 2 pounds, or you develop in leg swelling, experience more shortness of breath or chest pain, call your Primary MD immediately. Follow Cardiac Low Salt Diet and 1.5 lit/day fluid restriction.   On your next visit with your primary care physician please Get Medicines reviewed and adjusted.   Please request your Prim.MD to go over all Hospital Tests and Procedure/Radiological results at the follow up, please get all Hospital records sent to your Prim MD by signing hospital release before you go home.   If you experience worsening of your admission symptoms, develop shortness of breath, life threatening emergency, suicidal or homicidal thoughts you must seek medical attention immediately by calling 911 or calling your MD immediately  if symptoms less severe.  You Must read complete instructions/literature along with all the possible adverse reactions/side effects for all the Medicines you take and that have been prescribed to you. Take any new Medicines after you have completely understood and accpet all the possible adverse reactions/side effects.   Do not drive, operating heavy machinery, perform activities at heights, swimming or participation in water activities or provide baby sitting services if your were admitted for syncope or siezures until you have seen by Primary MD or a Neurologist and advised to do so again.  Do not drive when taking Pain medications.    Do not take more than prescribed Pain, Sleep and Anxiety Medications  Special Instructions: If you have smoked or chewed Tobacco  in  the last 2 yrs please stop smoking, stop any regular Alcohol  and or any Recreational drug use.  Wear Seat belts while driving.   Please note  You were cared for by a hospitalist during your hospital stay. If you have any questions about your discharge medications or the care you received while you were in the hospital after you are discharged, you can call the unit and asked to speak with the hospitalist on call if the hospitalist that took care of you is not available. Once you are discharged, your primary care physician will handle any further medical issues. Please note that NO REFILLS for any discharge medications will be authorized once you are discharged, as it is imperative that you return to your primary care physician (or establish a relationship with a primary care physician if you do not have one) for your aftercare needs so that they can reassess your need for medications and monitor your lab values.

## 2017-11-25 LAB — CULTURE, BLOOD (ROUTINE X 2)
Culture: NO GROWTH
Culture: NO GROWTH
SPECIAL REQUESTS: ADEQUATE
Special Requests: ADEQUATE

## 2017-11-29 DIAGNOSIS — D7589 Other specified diseases of blood and blood-forming organs: Secondary | ICD-10-CM | POA: Diagnosis not present

## 2017-11-29 DIAGNOSIS — C9110 Chronic lymphocytic leukemia of B-cell type not having achieved remission: Secondary | ICD-10-CM | POA: Diagnosis not present

## 2017-11-29 DIAGNOSIS — D472 Monoclonal gammopathy: Secondary | ICD-10-CM | POA: Diagnosis not present

## 2017-11-29 DIAGNOSIS — D649 Anemia, unspecified: Secondary | ICD-10-CM | POA: Diagnosis not present

## 2017-12-03 DIAGNOSIS — Z794 Long term (current) use of insulin: Secondary | ICD-10-CM | POA: Diagnosis not present

## 2017-12-03 DIAGNOSIS — E119 Type 2 diabetes mellitus without complications: Secondary | ICD-10-CM | POA: Diagnosis not present

## 2017-12-03 DIAGNOSIS — K529 Noninfective gastroenteritis and colitis, unspecified: Secondary | ICD-10-CM | POA: Diagnosis not present

## 2017-12-12 DIAGNOSIS — R3914 Feeling of incomplete bladder emptying: Secondary | ICD-10-CM | POA: Diagnosis not present

## 2017-12-12 DIAGNOSIS — Z87440 Personal history of urinary (tract) infections: Secondary | ICD-10-CM | POA: Diagnosis not present

## 2017-12-12 DIAGNOSIS — R350 Frequency of micturition: Secondary | ICD-10-CM | POA: Diagnosis not present

## 2017-12-12 DIAGNOSIS — N3941 Urge incontinence: Secondary | ICD-10-CM | POA: Diagnosis not present

## 2017-12-12 DIAGNOSIS — N401 Enlarged prostate with lower urinary tract symptoms: Secondary | ICD-10-CM | POA: Diagnosis not present

## 2017-12-17 DIAGNOSIS — N210 Calculus in bladder: Secondary | ICD-10-CM | POA: Diagnosis not present

## 2017-12-18 ENCOUNTER — Other Ambulatory Visit: Payer: Self-pay | Admitting: Urology

## 2017-12-19 ENCOUNTER — Encounter (HOSPITAL_BASED_OUTPATIENT_CLINIC_OR_DEPARTMENT_OTHER): Payer: Self-pay | Admitting: *Deleted

## 2017-12-19 ENCOUNTER — Other Ambulatory Visit: Payer: Self-pay

## 2017-12-19 NOTE — Progress Notes (Signed)
SPOKE W/ PT VIA PHONE FOR PRE-OP INTERVIEW.  NPO AFTER MN.  ARRIVE AT 1848.  NEEDS ISTAT.  CURRENT EKG IN CHART AND Epic.  WILL TAKE FLOMAX AM DOS W/ SIPS OF WATER.  PT VERBALIZED UNDERSTANDING TO HALF INSULIN DOSE NIGHT BEFORE SURGERY.

## 2017-12-24 NOTE — H&P (Signed)
CC/HPI: Frequency, Nocturia and Urgency     [name] is a 77yo WM who is a former patient of Dr. Ky Barban. He was just released from [facility] on 4/21 after urosepsis. He was admitted on 4/15. He has a history of a TURP over 10 years ago. He had an IPP placed 2 years ago. He had minimal symptoms prior to the onset of Sepsis. He was in [place] on 11/08/17 with LLL pneumonia. He had staph epi in the blood and urine when septic. he had a CT AP on 11/08/17 and was found to have no hydro. His bladder was full and he has a large prostate.   He initial made this appointment prior to the sepsis because he voids 30-40 x daily and has nocturia 5+ times. He has had further reduction in his stream over the last 7-10 days. He doesn't feel like he empties. He has UUI but is not wearing pads. He did get a bit better over the last 10 days but was wearing pads prior. He has had no history of stones. He has had 4 UTI's over the last 2-3 years. His IPSS is 26 and his UA has 6-10 WBC's. He was treated with doxycycline for the sepsis. He is also on tamsulosin and oxybutynin. He has been on those for 30 days.   He has had no problems with the IPP.    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   Tobacco Use Assessment Completed: Used Tobacco in last 30 days? Drinks 1 drink per day.  Drinks 3 caffeinated drinks per day.    REVIEW OF SYSTEMS:     GU Review Male:   Patient reports frequent urination, hard to postpone urination, burning/ pain with urination, get up at night to urinate, leakage of urine, stream starts and stops, and trouble starting your stream. Patient denies have to strain to urinate , erection problems, and penile pain.  Gastrointestinal (Upper):   Patient reports nausea. Patient denies vomiting and indigestion/ heartburn.  Gastrointestinal (Lower):   Patient reports diarrhea and constipation.   Constitutional:   Patient reports fever, night sweats, weight loss, and fatigue.   Skin:   Patient denies skin rash/ lesion and itching.  Eyes:   Patient denies blurred vision and double vision.  Ears/ Nose/ Throat:   Patient denies sore throat and sinus problems.  Hematologic/Lymphatic:   Patient reports easy bruising. Patient denies swollen glands.  Cardiovascular:   Patient reports leg swelling and chest pains.   Respiratory:   Patient reports cough and shortness of breath.   Endocrine:   Patient reports excessive thirst.   Musculoskeletal:   Patient denies back pain and joint pain.  Neurological:   Patient denies headaches and dizziness.  Psychologic:   Patient denies depression and anxiety.   VITAL SIGNS:      12/12/2017 01:29 PM  Weight  160 lb / 72.57 kg  Height 69.5 in / 176.53 cm  BP 137/66 mmHg  Pulse 83 /min  Temperature 98.4 F / 36.8 C  BMI 23.3 kg/m   GU PHYSICAL EXAMINATION:    Anus and Perineum: Moderate anal stenosis. No hemorrhoids. No rectal fissure, no anal fissure. No edema, no dimple, no perineal tenderness, no anal tenderness.   Scrotum: No lesions. No edema. No cysts. No warts.  Epididymides: Right: no spermatocele, no masses, no cysts, no tenderness, no induration, no enlargement. Left: no spermatocele, no masses, no cysts, no tenderness, no induration, no enlargement.  Testes: No tenderness, no swelling, no enlargement left testes. No tenderness, no swelling, no enlargement right testes. Normal  location left testes. Normal location right testes. No mass, no cyst, no varicocele, no hydrocele left testes. No mass, no cyst, no varicocele, no hydrocele right testes.  Urethral Meatus: Normal size. No lesion, no wart, no discharge, no polyp. Normal location.  Penis: Circumcised, IPP in good position without tenderness or erythema. , no meatal stenosis.   Prostate: Prostate 2 + size. Left lobe normal consistency, right lobe normal consistency. Symmetrical lobes. No prostate nodule. Left lobe no tenderness, right lobe no tenderness. unable to palpate the whole gland  Seminal Vesicles: Nonpalpable.  Sphincter Tone: Normal sphincter. No rectal tenderness. No rectal mass.    MULTI-SYSTEM PHYSICAL EXAMINATION:    Constitutional: Well-nourished. No physical deformities. Normally developed. Good grooming.  Neck: Neck symmetrical, not swollen. Normal tracheal position.  Respiratory: No labored breathing, no use of accessory muscles. Normal breath sounds.  Cardiovascular: Regular rate and rhythm. No murmur, no gallop.   Lymphatic: No enlargement of neck, axillae, groin.  Skin: No paleness, no jaundice, no cyanosis. No lesion, no ulcer, no rash.  Neurologic / Psychiatric: Oriented to time, oriented to place, oriented to person. No depression, no anxiety, no agitation.  Gastrointestinal: No hernia. No mass, no tenderness, no rigidity, non obese abdomen.   Musculoskeletal: Normal gait and station of head and neck.     PAST DATA REVIEWED:  Source Of History:  Patient  Lab Test Review:   CBC with Diff, CMP  Records Review:   Previous Hospital Records  Urine Test Review:   Urinalysis, Urine Culture  X-Ray Review: C.T. Abdomen/Pelvis: Reviewed Films. Reviewed Report. Discussed With Patient.    Notes:                     His Cr was 0.53. His Hgb is 8.0 on recent labs. I have reviewed his records in [place].    PROCEDURES:           PVR Ultrasound - 16109  Scanned Volume: 385 cc          Urinalysis Dipstick Dipstick Cont'd Micro  Color: Yellow Bilirubin: Neg WBC/hpf: 6 - 10/hpf  Appearance: Clear Ketones: Neg RBC/hpf: 0 - 2/hpf  Specific Gravity: 1.020 Blood: Neg Bacteria: Rare (0-9/hpf)  pH: 6.0 Protein: Neg Cystals: NS (Not Seen)  Glucose: Neg Urobilinogen: 0.2 Casts: NS (Not Seen)    Nitrites: Neg Trichomonas: Not Present    Leukocyte Esterase: Trace Mucous: Not Present      Epithelial Cells: 0 - 5/hpf      Yeast: NS (Not Seen)      Sperm: Not Present    ASSESSMENT:      ICD-10 Details  1 GU:   Personal Hx Urinary Tract Infections - Z87.440 He has some pyuria today so I will culture the urine.  2   BPH w/LUTS - N40.1 He has OAB wet with an elevated PVR of 332ml. I will have him return in the near future for a flowrate and cystoscopy. I have asked him to stop the oxybutynin.   3   Urinary Frequency - R35.0   4   Urge incontinence - N39.41   5   Incomplete bladder emptying - R39.14    PLAN:           Orders Labs Urine Culture          Schedule Return Visit/Planned Activity: Next Available Appointment - Office Visit, Cystoscopy, PVR, Flow Rate             Note: Please try to get him back in within the next 3 weeks.    At cystoscopy he was found to have prostatic urethral calculi which in retrospect were present on the CT and are fairly extensive in the prostate.    He will undergo cystolithalopaxy in the near future and the risks were reviewed in detail.

## 2017-12-24 NOTE — Anesthesia Preprocedure Evaluation (Addendum)
Anesthesia Evaluation  Patient identified by MRN, date of birth, ID band Patient awake    Reviewed: Allergy & Precautions, NPO status , Patient's Chart, lab work & pertinent test results  Airway Mallampati: II  TM Distance: >3 FB Neck ROM: Full    Dental no notable dental hx.    Pulmonary former smoker,    Pulmonary exam normal breath sounds clear to auscultation       Cardiovascular + Peripheral Vascular Disease  Normal cardiovascular exam Rhythm:Regular Rate:Normal     Neuro/Psych negative psych ROS   GI/Hepatic negative GI ROS, Neg liver ROS,   Endo/Other  diabetes, Type 2  Renal/GU negative Renal ROS  negative genitourinary   Musculoskeletal Monoclonal gamopathy   Abdominal   Peds  Hematology  (+) anemia ,   Anesthesia Other Findings All: sulfa, metformin  Reproductive/Obstetrics                            Lab Results  Component Value Date   WBC 10.1 11/22/2017   HGB 8.0 (L) 11/22/2017   HCT 25.2 (L) 11/22/2017   MCV 115.6 (H) 11/22/2017   PLT 425 (H) 11/22/2017    Anesthesia Physical Anesthesia Plan  ASA: III  Anesthesia Plan:    Post-op Pain Management:    Induction: Intravenous  PONV Risk Score and Plan: Treatment may vary due to age or medical condition  Airway Management Planned: LMA  Additional Equipment:   Intra-op Plan:   Post-operative Plan:   Informed Consent: I have reviewed the patients History and Physical, chart, labs and discussed the procedure including the risks, benefits and alternatives for the proposed anesthesia with the patient or authorized representative who has indicated his/her understanding and acceptance.     Plan Discussed with: CRNA and Anesthesiologist  Anesthesia Plan Comments:         Anesthesia Quick Evaluation

## 2017-12-25 ENCOUNTER — Encounter (HOSPITAL_BASED_OUTPATIENT_CLINIC_OR_DEPARTMENT_OTHER): Payer: Self-pay

## 2017-12-25 ENCOUNTER — Ambulatory Visit (HOSPITAL_BASED_OUTPATIENT_CLINIC_OR_DEPARTMENT_OTHER): Payer: Medicare HMO | Admitting: Anesthesiology

## 2017-12-25 ENCOUNTER — Encounter (HOSPITAL_BASED_OUTPATIENT_CLINIC_OR_DEPARTMENT_OTHER)
Admission: RE | Disposition: A | Discharge status: Home / Self Care | Payer: Self-pay | Source: Ambulatory Visit | Attending: Urology

## 2017-12-25 ENCOUNTER — Ambulatory Visit (HOSPITAL_BASED_OUTPATIENT_CLINIC_OR_DEPARTMENT_OTHER)
Admission: RE | Admit: 2017-12-25 | Discharge: 2017-12-25 | Disposition: A | Discharge status: Home / Self Care | Payer: Medicare HMO | Source: Ambulatory Visit | Attending: Urology | Admitting: Urology

## 2017-12-25 DIAGNOSIS — E785 Hyperlipidemia, unspecified: Secondary | ICD-10-CM | POA: Diagnosis not present

## 2017-12-25 DIAGNOSIS — R3914 Feeling of incomplete bladder emptying: Secondary | ICD-10-CM | POA: Diagnosis not present

## 2017-12-25 DIAGNOSIS — C91Z0 Other lymphoid leukemia not having achieved remission: Secondary | ICD-10-CM | POA: Insufficient documentation

## 2017-12-25 DIAGNOSIS — Z79899 Other long term (current) drug therapy: Secondary | ICD-10-CM | POA: Diagnosis not present

## 2017-12-25 DIAGNOSIS — Z87891 Personal history of nicotine dependence: Secondary | ICD-10-CM | POA: Diagnosis not present

## 2017-12-25 DIAGNOSIS — N3941 Urge incontinence: Secondary | ICD-10-CM | POA: Diagnosis not present

## 2017-12-25 DIAGNOSIS — N401 Enlarged prostate with lower urinary tract symptoms: Secondary | ICD-10-CM | POA: Insufficient documentation

## 2017-12-25 DIAGNOSIS — N3289 Other specified disorders of bladder: Secondary | ICD-10-CM | POA: Diagnosis not present

## 2017-12-25 DIAGNOSIS — N210 Calculus in bladder: Secondary | ICD-10-CM | POA: Diagnosis not present

## 2017-12-25 DIAGNOSIS — Z87440 Personal history of urinary (tract) infections: Secondary | ICD-10-CM | POA: Insufficient documentation

## 2017-12-25 DIAGNOSIS — N211 Calculus in urethra: Secondary | ICD-10-CM | POA: Insufficient documentation

## 2017-12-25 DIAGNOSIS — E119 Type 2 diabetes mellitus without complications: Secondary | ICD-10-CM | POA: Insufficient documentation

## 2017-12-25 DIAGNOSIS — J309 Allergic rhinitis, unspecified: Secondary | ICD-10-CM | POA: Diagnosis not present

## 2017-12-25 HISTORY — DX: Changes in skin texture: R23.4

## 2017-12-25 HISTORY — DX: Carpal tunnel syndrome, bilateral upper limbs: G56.03

## 2017-12-25 HISTORY — DX: Polyneuropathy, unspecified: G62.9

## 2017-12-25 HISTORY — DX: Monoclonal gammopathy: D47.2

## 2017-12-25 HISTORY — DX: Unspecified osteoarthritis, unspecified site: M19.90

## 2017-12-25 HISTORY — DX: Nutritional anemia, unspecified: D53.9

## 2017-12-25 HISTORY — DX: Hesitancy of micturition: R39.11

## 2017-12-25 HISTORY — DX: Unspecified symptoms and signs involving the genitourinary system: R39.9

## 2017-12-25 HISTORY — DX: Rash and other nonspecific skin eruption: R21

## 2017-12-25 HISTORY — DX: Personal history of pneumonia (recurrent): Z87.01

## 2017-12-25 HISTORY — DX: Male erectile dysfunction, unspecified: N52.9

## 2017-12-25 HISTORY — DX: Personal history of other infectious and parasitic diseases: Z86.19

## 2017-12-25 HISTORY — DX: Collagenous colitis: K52.831

## 2017-12-25 HISTORY — PX: CYSTOSCOPY WITH LITHOLAPAXY: SHX1425

## 2017-12-25 HISTORY — DX: Personal history of other malignant neoplasm of skin: Z85.828

## 2017-12-25 HISTORY — DX: Calculus of prostate: N42.0

## 2017-12-25 HISTORY — DX: Presence of spectacles and contact lenses: Z97.3

## 2017-12-25 HISTORY — DX: Chronic lymphocytic leukemia of B-cell type not having achieved remission: C91.10

## 2017-12-25 HISTORY — DX: Type 2 diabetes mellitus without complications: E11.9

## 2017-12-25 HISTORY — DX: Personal history of adenomatous and serrated colon polyps: Z86.0101

## 2017-12-25 HISTORY — DX: Anemia, unspecified: D64.9

## 2017-12-25 HISTORY — DX: Elevated white blood cell count, unspecified: D72.829

## 2017-12-25 HISTORY — DX: Benign prostatic hyperplasia without lower urinary tract symptoms: N40.0

## 2017-12-25 HISTORY — DX: Raynaud's syndrome without gangrene: I73.00

## 2017-12-25 HISTORY — DX: Personal history of colonic polyps: Z86.010

## 2017-12-25 HISTORY — DX: Noninfective gastroenteritis and colitis, unspecified: K52.9

## 2017-12-25 HISTORY — DX: Long term (current) use of insulin: Z79.4

## 2017-12-25 LAB — GLUCOSE, CAPILLARY: Glucose-Capillary: 195 mg/dL — ABNORMAL HIGH (ref 65–99)

## 2017-12-25 LAB — POCT I-STAT 4, (NA,K, GLUC, HGB,HCT)
GLUCOSE: 208 mg/dL — AB (ref 65–99)
HEMATOCRIT: 31 % — AB (ref 39.0–52.0)
Hemoglobin: 10.5 g/dL — ABNORMAL LOW (ref 13.0–17.0)
POTASSIUM: 4.3 mmol/L (ref 3.5–5.1)
SODIUM: 141 mmol/L (ref 135–145)

## 2017-12-25 SURGERY — CYSTOSCOPY, WITH BLADDER CALCULUS LITHOLAPAXY
Anesthesia: General

## 2017-12-25 MED ORDER — LIDOCAINE 2% (20 MG/ML) 5 ML SYRINGE
INTRAMUSCULAR | Status: AC
  Filled 2017-12-25: qty 5

## 2017-12-25 MED ORDER — MIDAZOLAM HCL 2 MG/2ML IJ SOLN
INTRAMUSCULAR | Status: AC
  Filled 2017-12-25: qty 2

## 2017-12-25 MED ORDER — KETOROLAC TROMETHAMINE 30 MG/ML IJ SOLN
INTRAMUSCULAR | Status: AC
  Filled 2017-12-25: qty 1

## 2017-12-25 MED ORDER — PROPOFOL 10 MG/ML IV BOLUS
INTRAVENOUS | Status: AC
  Filled 2017-12-25: qty 20

## 2017-12-25 MED ORDER — MORPHINE SULFATE (PF) 2 MG/ML IV SOLN
1.0000 mg | INTRAVENOUS | Status: DC | PRN
  Filled 2017-12-25: qty 0.5

## 2017-12-25 MED ORDER — ACETAMINOPHEN 10 MG/ML IV SOLN
1000.0000 mg | Freq: Once | INTRAVENOUS | Status: DC | PRN
  Filled 2017-12-25: qty 100

## 2017-12-25 MED ORDER — HYDROMORPHONE HCL 1 MG/ML IJ SOLN
0.2500 mg | INTRAMUSCULAR | Status: DC | PRN
  Filled 2017-12-25: qty 0.5

## 2017-12-25 MED ORDER — OXYCODONE HCL 5 MG PO TABS
5.0000 mg | ORAL_TABLET | ORAL | Status: DC | PRN
  Filled 2017-12-25: qty 2

## 2017-12-25 MED ORDER — ACETAMINOPHEN 650 MG RE SUPP
650.0000 mg | RECTAL | Status: DC | PRN
  Filled 2017-12-25: qty 1

## 2017-12-25 MED ORDER — FENTANYL CITRATE (PF) 100 MCG/2ML IJ SOLN
INTRAMUSCULAR | Status: DC | PRN
  Administered 2017-12-25: 50 ug via INTRAVENOUS
  Administered 2017-12-25: 25 ug via INTRAVENOUS

## 2017-12-25 MED ORDER — DEXAMETHASONE SODIUM PHOSPHATE 10 MG/ML IJ SOLN
INTRAMUSCULAR | Status: AC
  Filled 2017-12-25: qty 1

## 2017-12-25 MED ORDER — ACETAMINOPHEN 325 MG PO TABS
650.0000 mg | ORAL_TABLET | ORAL | Status: DC | PRN
  Filled 2017-12-25: qty 2

## 2017-12-25 MED ORDER — SODIUM CHLORIDE 0.9% FLUSH
3.0000 mL | Freq: Two times a day (BID) | INTRAVENOUS | Status: DC
  Filled 2017-12-25: qty 3

## 2017-12-25 MED ORDER — FENTANYL CITRATE (PF) 100 MCG/2ML IJ SOLN
INTRAMUSCULAR | Status: AC
  Filled 2017-12-25: qty 2

## 2017-12-25 MED ORDER — SODIUM CHLORIDE 0.9% FLUSH
3.0000 mL | INTRAVENOUS | Status: DC | PRN
  Filled 2017-12-25: qty 3

## 2017-12-25 MED ORDER — DEXAMETHASONE SODIUM PHOSPHATE 10 MG/ML IJ SOLN
INTRAMUSCULAR | Status: DC | PRN
  Administered 2017-12-25: 10 mg via INTRAVENOUS

## 2017-12-25 MED ORDER — LACTATED RINGERS IV SOLN
INTRAVENOUS | Status: DC
  Administered 2017-12-25: 07:00:00 via INTRAVENOUS
  Filled 2017-12-25: qty 1000

## 2017-12-25 MED ORDER — CEFAZOLIN SODIUM-DEXTROSE 2-4 GM/100ML-% IV SOLN
INTRAVENOUS | Status: AC
  Filled 2017-12-25: qty 100

## 2017-12-25 MED ORDER — CEFAZOLIN SODIUM-DEXTROSE 2-4 GM/100ML-% IV SOLN
2.0000 g | INTRAVENOUS | Status: AC
  Administered 2017-12-25: 2 g via INTRAVENOUS
  Filled 2017-12-25: qty 100

## 2017-12-25 MED ORDER — LIDOCAINE 2% (20 MG/ML) 5 ML SYRINGE
INTRAMUSCULAR | Status: DC | PRN
  Administered 2017-12-25: 80 mg via INTRAVENOUS

## 2017-12-25 MED ORDER — MEPERIDINE HCL 25 MG/ML IJ SOLN
6.2500 mg | INTRAMUSCULAR | Status: DC | PRN
  Filled 2017-12-25: qty 1

## 2017-12-25 MED ORDER — TRAMADOL HCL 50 MG PO TABS: 50.0000 mg | ORAL_TABLET | Freq: Four times a day (QID) | ORAL | 0 refills | Status: DC | PRN

## 2017-12-25 MED ORDER — SODIUM CHLORIDE 0.9 % IV SOLN
250.0000 mL | INTRAVENOUS | Status: DC | PRN
  Filled 2017-12-25: qty 250

## 2017-12-25 MED ORDER — PROPOFOL 10 MG/ML IV BOLUS
INTRAVENOUS | Status: DC | PRN
  Administered 2017-12-25: 180 mg via INTRAVENOUS

## 2017-12-25 MED ORDER — ONDANSETRON HCL 4 MG/2ML IJ SOLN
INTRAMUSCULAR | Status: DC | PRN
  Administered 2017-12-25: 4 mg via INTRAVENOUS

## 2017-12-25 MED ORDER — ACETAMINOPHEN 500 MG PO TABS
1000.0000 mg | ORAL_TABLET | Freq: Once | ORAL | Status: AC
Start: 2017-12-25 — End: 2017-12-25
  Administered 2017-12-25: 1000 mg via ORAL
  Filled 2017-12-25: qty 2

## 2017-12-25 MED ORDER — ACETAMINOPHEN 500 MG PO TABS
ORAL_TABLET | ORAL | Status: AC
  Filled 2017-12-25: qty 2

## 2017-12-25 MED ORDER — ONDANSETRON HCL 4 MG/2ML IJ SOLN
INTRAMUSCULAR | Status: AC
  Filled 2017-12-25: qty 2

## 2017-12-25 MED ORDER — HYDROCODONE-ACETAMINOPHEN 7.5-325 MG PO TABS
1.0000 | ORAL_TABLET | Freq: Once | ORAL | Status: DC | PRN
  Filled 2017-12-25: qty 1

## 2017-12-25 SURGICAL SUPPLY — 26 items
BAG DRAIN URO-CYSTO SKYTR STRL (DRAIN) ×2 IMPLANT
BASKET STONE 1.7 NGAGE (UROLOGICAL SUPPLIES) ×2 IMPLANT
BASKET ZERO TIP NITINOL 2.4FR (BASKET) IMPLANT
CATH URET 5FR 28IN CONE TIP (BALLOONS)
CATH URET 5FR 28IN OPEN ENDED (CATHETERS) IMPLANT
CATH URET 5FR 70CM CONE TIP (BALLOONS) IMPLANT
CLOTH BEACON ORANGE TIMEOUT ST (SAFETY) ×2 IMPLANT
ELECT REM PT RETURN 9FT ADLT (ELECTROSURGICAL)
ELECTRODE REM PT RTRN 9FT ADLT (ELECTROSURGICAL) IMPLANT
FIBER LASER FLEXIVA 1000 (UROLOGICAL SUPPLIES) ×2 IMPLANT
FIBER LASER FLEXIVA 365 (UROLOGICAL SUPPLIES) IMPLANT
FIBER LASER TRAC TIP (UROLOGICAL SUPPLIES) IMPLANT
GLOVE SURG SS PI 8.0 STRL IVOR (GLOVE) ×2 IMPLANT
GOWN STRL REUS W/TWL XL LVL3 (GOWN DISPOSABLE) ×2 IMPLANT
GUIDEWIRE 0.038 PTFE COATED (WIRE) IMPLANT
GUIDEWIRE ANG ZIPWIRE 038X150 (WIRE) IMPLANT
GUIDEWIRE STR DUAL SENSOR (WIRE) IMPLANT
INFUSOR MANOMETER BAG 3000ML (MISCELLANEOUS) IMPLANT
IV NS IRRIG 3000ML ARTHROMATIC (IV SOLUTION) IMPLANT
KIT TURNOVER CYSTO (KITS) ×2 IMPLANT
MANIFOLD NEPTUNE II (INSTRUMENTS) ×2 IMPLANT
NS IRRIG 500ML POUR BTL (IV SOLUTION) IMPLANT
PACK CYSTO (CUSTOM PROCEDURE TRAY) ×2 IMPLANT
SYRINGE IRR TOOMEY STRL 70CC (SYRINGE) ×2 IMPLANT
TUBE CONNECTING 12X1/4 (SUCTIONS) ×2 IMPLANT
WATER STERILE IRR 3000ML UROMA (IV SOLUTION) ×2 IMPLANT

## 2017-12-25 NOTE — Interval H&P Note (Signed)
History and Physical Interval Note:  12/25/2017 7:16 AM  Tommy Peterson  has presented today for surgery, with the diagnosis of PROSTATIC URETHRAL STONE  The various methods of treatment have been discussed with the patient and family. After consideration of risks, benefits and other options for treatment, the patient has consented to  Procedure(s): CYSTOSCOPY WITH LITHOLAPAXY (N/A) as a surgical intervention .  The patient's history has been reviewed, patient examined, no change in status, stable for surgery.  I have reviewed the patient's chart and labs.  Questions were answered to the patient's satisfaction.     [name]

## 2017-12-25 NOTE — Anesthesia Postprocedure Evaluation (Signed)
Anesthesia Post Note  Patient: Tommy Peterson  Procedure(s) Performed: CYSTOSCOPY WITH LITHOLAPAXY AND FULGERATION (N/A )     Patient location during evaluation: PACU Anesthesia Type: General Level of consciousness: awake and alert Pain management: pain level controlled Vital Signs Assessment: post-procedure vital signs reviewed and stable Respiratory status: spontaneous breathing, nonlabored ventilation, respiratory function stable and patient connected to nasal cannula oxygen Cardiovascular status: blood pressure returned to baseline and stable Postop Assessment: no apparent nausea or vomiting Anesthetic complications: no    Last Vitals:  Vitals:   12/25/17 1015 12/25/17 1041  BP: 115/60 (!) 118/58  Pulse: 78 65  Resp: (!) 22 20  Temp:  36.5 C  SpO2: 98% 98%    Last Pain:  Vitals:   12/25/17 1041  TempSrc:   PainSc: 0-No pain                 Barnet Glasgow

## 2017-12-25 NOTE — Discharge Instructions (Addendum)
CYSTOSCOPY HOME CARE INSTRUCTIONS  Activity: Rest for the remainder of the day.  Do not drive or operate equipment today.  You may resume normal activities in one to two days as instructed by your physician.   Meals: Drink plenty of liquids and eat light foods such as gelatin or soup this evening.  You may return to a normal meal plan tomorrow.  Return to Work: You may return to work in one to two days or as instructed by your physician.  Special Instructions / Symptoms: Call your physician if any of these symptoms occur:   -persistent or heavy bleeding  -bleeding which continues  -large blood clots that are difficult to pass  -urine stream diminishes or stops completely  -fever equal to or higher than 101 degrees Farenheit.  -cloudy urine with a strong, foul odor  -severe pain    You may feel some burning pain when you urinate.  This should disappear with time.  Applying moist heat to the lower abdomen or a hot tub bath may help relieve the pain. \  Bring stones to office for analysis.    Post Anesthesia Home Care Instructions  Activity: Get plenty of rest for the remainder of the day. A responsible individual must stay with you for 24 hours following the procedure.  For the next 24 hours, DO NOT: -Drive a car -Paediatric nurse -Drink alcoholic beverages -Take any medication unless instructed by your physician -Make any legal decisions or sign important papers.  Meals: Start with liquid foods such as gelatin or soup. Progress to regular foods as tolerated. Avoid greasy, spicy, heavy foods. If nausea and/or vomiting occur, drink only clear liquids until the nausea and/or vomiting subsides. Call your physician if vomiting continues.  Special Instructions/Symptoms: Your throat may feel dry or sore from the anesthesia or the breathing tube placed in your throat during surgery. If this causes discomfort, gargle with warm salt water. The discomfort should disappear within 24  hours.  If you had a scopolamine patch placed behind your ear for the management of post- operative nausea and/or vomiting:  1. The medication in the patch is effective for 72 hours, after which it should be removed.  Wrap patch in a tissue and discard in the trash. Wash hands thoroughly with soap and water. 2. You may remove the patch earlier than 72 hours if you experience unpleasant side effects which may include dry mouth, dizziness or visual disturbances. 3. Avoid touching the patch. Wash your hands with soap and water after contact with the patch.

## 2017-12-25 NOTE — Transfer of Care (Signed)
Immediate Anesthesia Transfer of Care Note  Patient: Tommy Peterson  Procedure(s) Performed: Procedure(s) (LRB): CYSTOSCOPY WITH LITHOLAPAXY AND FULGERATION (N/A)  Patient Location: PACU  Anesthesia Type: General  Level of Consciousness: awake, oriented, sedated and patient cooperative  Airway & Oxygen Therapy: Patient Spontanous Breathing and Patient connected to face mask oxygen  Post-op Assessment: Report given to PACU RN and Post -op Vital signs reviewed and stable  Post vital signs: Reviewed and stable  Complications: No apparent anesthesia complications  Last Vitals:  Vitals Value Taken Time  BP    Temp    Pulse 64 12/25/2017  9:12 AM  Resp 13 12/25/2017  9:12 AM  SpO2 96 % 12/25/2017  9:12 AM  Vitals shown include unvalidated device data.  Last Pain:  Vitals:   12/25/17 0652  TempSrc: Oral      Patients Stated Pain Goal: 8 (12/25/17 0711)

## 2017-12-25 NOTE — Anesthesia Procedure Notes (Signed)
Procedure Name: LMA Insertion Date/Time: 12/25/2017 8:25 AM Performed by: Suan Halter, CRNA Pre-anesthesia Checklist: Patient identified, Emergency Drugs available, Suction available and Patient being monitored Patient Re-evaluated:Patient Re-evaluated prior to induction Oxygen Delivery Method: Circle system utilized Preoxygenation: Pre-oxygenation with 100% oxygen Induction Type: IV induction Ventilation: Mask ventilation without difficulty LMA: LMA inserted LMA Size: 4.0 Number of attempts: 1 Airway Equipment and Method: Bite block Placement Confirmation: positive ETCO2 Tube secured with: Tape Dental Injury: Teeth and Oropharynx as per pre-operative assessment

## 2017-12-25 NOTE — Op Note (Signed)
Procedure: Cystolitholapaxy of > 2.5 cm stone and fulguration of bleeder.  Preop diagnosis: multiple prostatic urethral calculi with combined diameter greater than 2.5 cm.  Postop diagnosis: Same   Surgeon: Dr. [name].  Anesthesia: General.  Specimen: Stone fragments.  Drains: None.  EBL: minimal.  Complications: None.  Indications: Tommy Peterson is a 77 year old white male who had a recent episode of urosepsis he was found on evaluation to have multiple prostatic urethral calculi with one right at the membranous sphincter causing obstruction.  Procedure: He was given antibiotics and taken to the operating room where general anesthetic was induced.  He was placed in the lithotomy position.  PAS hose were placed his perineum and genitalia were prepped with Betadine solution and draped in usual sterile fashion.  Cystoscopy was performed using a 23 [place] scope and 30 degree lens.  There is minor difficulty inserting the scope requiring use the obturator because the patient's penile prosthesis and some relative urethral narrowing.  The urethra was otherwise unremarkable.  At the external sphincter there was a spiculated stone there is been noted on office cystoscopy.  The stone was pushed back into the bladder and inspection of the prostatic fossa demonstrated multiple additional stones adherent to the mucosa and the resection defect from his prior TURP.  The stones were teased away from the mucosa and pushed into the bladder.  1000 m holmium laser fiber was then inserted through the cystoscope and the power set on 1 W and 10 Hz but eventually increased to 2 W at 20 Hz.  The stone was broken and the manageable fragments which were then removed with a combination of aspiration and the use of an engage basket.  The total diameter of the stone burden was greater than 2.5 cm.  After the stones been removed inspection demonstrated some active bleeding from the mucosal areas where the stones had been  adherent.  The bleeding was controlled with gentle use of a Bugbee electrode.  Once hemostasis was achieved, the bladder was partially drained and the cystoscope was removed.  Pressure on the bladder produced clear return.  He was taken down from lithotomy position, his anesthetic was reversed and he was moved to recovery in stable condition.  There were no complications.

## 2017-12-26 ENCOUNTER — Encounter (HOSPITAL_BASED_OUTPATIENT_CLINIC_OR_DEPARTMENT_OTHER): Payer: Self-pay | Admitting: Urology

## 2017-12-26 DIAGNOSIS — N401 Enlarged prostate with lower urinary tract symptoms: Secondary | ICD-10-CM | POA: Diagnosis not present

## 2017-12-26 DIAGNOSIS — R3914 Feeling of incomplete bladder emptying: Secondary | ICD-10-CM | POA: Diagnosis not present

## 2017-12-26 DIAGNOSIS — M65311 Trigger thumb, right thumb: Secondary | ICD-10-CM | POA: Diagnosis not present

## 2017-12-26 DIAGNOSIS — R350 Frequency of micturition: Secondary | ICD-10-CM | POA: Diagnosis not present

## 2017-12-26 DIAGNOSIS — N3941 Urge incontinence: Secondary | ICD-10-CM | POA: Diagnosis not present

## 2018-01-02 DIAGNOSIS — R351 Nocturia: Secondary | ICD-10-CM | POA: Diagnosis not present

## 2018-01-02 DIAGNOSIS — N210 Calculus in bladder: Secondary | ICD-10-CM | POA: Diagnosis not present

## 2018-01-10 DIAGNOSIS — D72820 Lymphocytosis (symptomatic): Secondary | ICD-10-CM | POA: Diagnosis not present

## 2018-01-10 DIAGNOSIS — C9110 Chronic lymphocytic leukemia of B-cell type not having achieved remission: Secondary | ICD-10-CM | POA: Diagnosis not present

## 2018-01-10 DIAGNOSIS — D649 Anemia, unspecified: Secondary | ICD-10-CM | POA: Diagnosis not present

## 2018-01-10 DIAGNOSIS — D472 Monoclonal gammopathy: Secondary | ICD-10-CM | POA: Diagnosis not present

## 2018-01-14 DIAGNOSIS — N390 Urinary tract infection, site not specified: Secondary | ICD-10-CM | POA: Diagnosis not present

## 2018-01-14 DIAGNOSIS — Z5181 Encounter for therapeutic drug level monitoring: Secondary | ICD-10-CM | POA: Diagnosis not present

## 2018-01-14 DIAGNOSIS — R350 Frequency of micturition: Secondary | ICD-10-CM | POA: Diagnosis not present

## 2018-01-14 DIAGNOSIS — Z794 Long term (current) use of insulin: Secondary | ICD-10-CM | POA: Diagnosis not present

## 2018-01-14 DIAGNOSIS — Z125 Encounter for screening for malignant neoplasm of prostate: Secondary | ICD-10-CM | POA: Diagnosis not present

## 2018-01-14 DIAGNOSIS — E118 Type 2 diabetes mellitus with unspecified complications: Secondary | ICD-10-CM | POA: Diagnosis not present

## 2018-01-28 DIAGNOSIS — E118 Type 2 diabetes mellitus with unspecified complications: Secondary | ICD-10-CM | POA: Diagnosis not present

## 2018-01-28 DIAGNOSIS — N400 Enlarged prostate without lower urinary tract symptoms: Secondary | ICD-10-CM | POA: Diagnosis not present

## 2018-01-28 DIAGNOSIS — Z0000 Encounter for general adult medical examination without abnormal findings: Secondary | ICD-10-CM | POA: Diagnosis not present

## 2018-01-30 DIAGNOSIS — M65311 Trigger thumb, right thumb: Secondary | ICD-10-CM | POA: Diagnosis not present

## 2018-02-05 DIAGNOSIS — Z8619 Personal history of other infectious and parasitic diseases: Secondary | ICD-10-CM | POA: Diagnosis not present

## 2018-02-05 DIAGNOSIS — C91Z0 Other lymphoid leukemia not having achieved remission: Secondary | ICD-10-CM | POA: Diagnosis not present

## 2018-02-05 DIAGNOSIS — K52831 Collagenous colitis: Secondary | ICD-10-CM | POA: Diagnosis not present

## 2018-02-05 DIAGNOSIS — D72829 Elevated white blood cell count, unspecified: Secondary | ICD-10-CM | POA: Diagnosis not present

## 2018-02-05 DIAGNOSIS — D619 Aplastic anemia, unspecified: Secondary | ICD-10-CM | POA: Diagnosis not present

## 2018-02-11 DIAGNOSIS — E119 Type 2 diabetes mellitus without complications: Secondary | ICD-10-CM | POA: Diagnosis not present

## 2018-02-11 DIAGNOSIS — K529 Noninfective gastroenteritis and colitis, unspecified: Secondary | ICD-10-CM | POA: Diagnosis not present

## 2018-02-11 DIAGNOSIS — Z794 Long term (current) use of insulin: Secondary | ICD-10-CM | POA: Diagnosis not present

## 2018-02-12 DIAGNOSIS — C9110 Chronic lymphocytic leukemia of B-cell type not having achieved remission: Secondary | ICD-10-CM | POA: Diagnosis not present

## 2018-02-12 DIAGNOSIS — D472 Monoclonal gammopathy: Secondary | ICD-10-CM | POA: Diagnosis not present

## 2018-02-18 ENCOUNTER — Inpatient Hospital Stay (HOSPITAL_COMMUNITY)
Admission: EM | Admit: 2018-02-18 | Discharge: 2018-02-21 | DRG: 193 | Disposition: A | Discharge status: Home / Self Care | Payer: Medicare HMO | Attending: Family Medicine | Admitting: Family Medicine

## 2018-02-18 ENCOUNTER — Emergency Department (HOSPITAL_COMMUNITY): Payer: Medicare HMO

## 2018-02-18 ENCOUNTER — Other Ambulatory Visit: Payer: Self-pay

## 2018-02-18 ENCOUNTER — Encounter (HOSPITAL_COMMUNITY): Payer: Self-pay | Admitting: Internal Medicine

## 2018-02-18 DIAGNOSIS — Z888 Allergy status to other drugs, medicaments and biological substances status: Secondary | ICD-10-CM | POA: Diagnosis not present

## 2018-02-18 DIAGNOSIS — E1165 Type 2 diabetes mellitus with hyperglycemia: Secondary | ICD-10-CM | POA: Diagnosis not present

## 2018-02-18 DIAGNOSIS — D849 Immunodeficiency, unspecified: Secondary | ICD-10-CM

## 2018-02-18 DIAGNOSIS — J9600 Acute respiratory failure, unspecified whether with hypoxia or hypercapnia: Secondary | ICD-10-CM | POA: Diagnosis present

## 2018-02-18 DIAGNOSIS — Z87891 Personal history of nicotine dependence: Secondary | ICD-10-CM

## 2018-02-18 DIAGNOSIS — J189 Pneumonia, unspecified organism: Secondary | ICD-10-CM | POA: Diagnosis not present

## 2018-02-18 DIAGNOSIS — I7300 Raynaud's syndrome without gangrene: Secondary | ICD-10-CM | POA: Diagnosis present

## 2018-02-18 DIAGNOSIS — Z91030 Bee allergy status: Secondary | ICD-10-CM

## 2018-02-18 DIAGNOSIS — M1990 Unspecified osteoarthritis, unspecified site: Secondary | ICD-10-CM | POA: Diagnosis present

## 2018-02-18 DIAGNOSIS — I10 Essential (primary) hypertension: Secondary | ICD-10-CM | POA: Diagnosis not present

## 2018-02-18 DIAGNOSIS — R531 Weakness: Secondary | ICD-10-CM | POA: Diagnosis not present

## 2018-02-18 DIAGNOSIS — A419 Sepsis, unspecified organism: Secondary | ICD-10-CM | POA: Diagnosis present

## 2018-02-18 DIAGNOSIS — D472 Monoclonal gammopathy: Secondary | ICD-10-CM | POA: Diagnosis present

## 2018-02-18 DIAGNOSIS — E1142 Type 2 diabetes mellitus with diabetic polyneuropathy: Secondary | ICD-10-CM | POA: Diagnosis present

## 2018-02-18 DIAGNOSIS — N400 Enlarged prostate without lower urinary tract symptoms: Secondary | ICD-10-CM | POA: Diagnosis present

## 2018-02-18 DIAGNOSIS — E785 Hyperlipidemia, unspecified: Secondary | ICD-10-CM | POA: Diagnosis not present

## 2018-02-18 DIAGNOSIS — D500 Iron deficiency anemia secondary to blood loss (chronic): Secondary | ICD-10-CM | POA: Diagnosis present

## 2018-02-18 DIAGNOSIS — Z794 Long term (current) use of insulin: Secondary | ICD-10-CM | POA: Diagnosis not present

## 2018-02-18 DIAGNOSIS — Z79899 Other long term (current) drug therapy: Secondary | ICD-10-CM

## 2018-02-18 DIAGNOSIS — K52831 Collagenous colitis: Secondary | ICD-10-CM | POA: Diagnosis present

## 2018-02-18 DIAGNOSIS — R739 Hyperglycemia, unspecified: Secondary | ICD-10-CM

## 2018-02-18 DIAGNOSIS — I480 Paroxysmal atrial fibrillation: Secondary | ICD-10-CM | POA: Diagnosis not present

## 2018-02-18 DIAGNOSIS — R0902 Hypoxemia: Secondary | ICD-10-CM | POA: Diagnosis not present

## 2018-02-18 DIAGNOSIS — N12 Tubulo-interstitial nephritis, not specified as acute or chronic: Secondary | ICD-10-CM | POA: Diagnosis not present

## 2018-02-18 DIAGNOSIS — E119 Type 2 diabetes mellitus without complications: Secondary | ICD-10-CM | POA: Diagnosis not present

## 2018-02-18 DIAGNOSIS — C91Z0 Other lymphoid leukemia not having achieved remission: Secondary | ICD-10-CM | POA: Diagnosis present

## 2018-02-18 DIAGNOSIS — Y95 Nosocomial condition: Secondary | ICD-10-CM | POA: Diagnosis present

## 2018-02-18 DIAGNOSIS — J181 Lobar pneumonia, unspecified organism: Principal | ICD-10-CM | POA: Diagnosis present

## 2018-02-18 DIAGNOSIS — D899 Disorder involving the immune mechanism, unspecified: Secondary | ICD-10-CM

## 2018-02-18 DIAGNOSIS — N3000 Acute cystitis without hematuria: Secondary | ICD-10-CM | POA: Diagnosis not present

## 2018-02-18 DIAGNOSIS — J9601 Acute respiratory failure with hypoxia: Secondary | ICD-10-CM | POA: Diagnosis present

## 2018-02-18 DIAGNOSIS — R05 Cough: Secondary | ICD-10-CM | POA: Diagnosis not present

## 2018-02-18 DIAGNOSIS — I361 Nonrheumatic tricuspid (valve) insufficiency: Secondary | ICD-10-CM | POA: Diagnosis not present

## 2018-02-18 DIAGNOSIS — Z882 Allergy status to sulfonamides status: Secondary | ICD-10-CM | POA: Diagnosis not present

## 2018-02-18 DIAGNOSIS — B9620 Unspecified Escherichia coli [E. coli] as the cause of diseases classified elsewhere: Secondary | ICD-10-CM | POA: Diagnosis present

## 2018-02-18 DIAGNOSIS — I959 Hypotension, unspecified: Secondary | ICD-10-CM | POA: Diagnosis not present

## 2018-02-18 DIAGNOSIS — N390 Urinary tract infection, site not specified: Secondary | ICD-10-CM | POA: Diagnosis not present

## 2018-02-18 HISTORY — DX: Pneumonia, unspecified organism: J18.9

## 2018-02-18 HISTORY — DX: Acute respiratory failure, unspecified whether with hypoxia or hypercapnia: J96.00

## 2018-02-18 LAB — COMPREHENSIVE METABOLIC PANEL
ALK PHOS: 80 U/L (ref 38–126)
ALT: 15 U/L (ref 0–44)
AST: 19 U/L (ref 15–41)
Albumin: 3.4 g/dL — ABNORMAL LOW (ref 3.5–5.0)
Anion gap: 7 (ref 5–15)
BUN: 16 mg/dL (ref 8–23)
CHLORIDE: 108 mmol/L (ref 98–111)
CO2: 22 mmol/L (ref 22–32)
CREATININE: 0.66 mg/dL (ref 0.61–1.24)
Calcium: 8.5 mg/dL — ABNORMAL LOW (ref 8.9–10.3)
Glucose, Bld: 219 mg/dL — ABNORMAL HIGH (ref 70–99)
Potassium: 3.8 mmol/L (ref 3.5–5.1)
SODIUM: 137 mmol/L (ref 135–145)
Total Bilirubin: 1.1 mg/dL (ref 0.3–1.2)
Total Protein: 6.2 g/dL — ABNORMAL LOW (ref 6.5–8.1)

## 2018-02-18 LAB — CBC WITH DIFFERENTIAL/PLATELET
BASOS PCT: 0 %
Basophils Absolute: 0 10*3/uL (ref 0.0–0.1)
EOS PCT: 0 %
Eosinophils Absolute: 0 10*3/uL (ref 0.0–0.7)
HCT: 26.8 % — ABNORMAL LOW (ref 39.0–52.0)
Hemoglobin: 8.8 g/dL — ABNORMAL LOW (ref 13.0–17.0)
LYMPHS ABS: 2.1 10*3/uL (ref 0.7–4.0)
Lymphocytes Relative: 8 %
MCH: 40.4 pg — AB (ref 26.0–34.0)
MCHC: 32.8 g/dL (ref 30.0–36.0)
MCV: 122.9 fL — AB (ref 78.0–100.0)
MONO ABS: 3.6 10*3/uL — AB (ref 0.1–1.0)
Monocytes Relative: 14 %
Neutro Abs: 20 10*3/uL — ABNORMAL HIGH (ref 1.7–7.7)
Neutrophils Relative %: 78 %
PLATELETS: 337 10*3/uL (ref 150–400)
RBC: 2.18 MIL/uL — ABNORMAL LOW (ref 4.22–5.81)
RDW: 16.1 % — AB (ref 11.5–15.5)
WBC: 25.7 10*3/uL — ABNORMAL HIGH (ref 4.0–10.5)

## 2018-02-18 LAB — URINALYSIS, ROUTINE W REFLEX MICROSCOPIC
Bilirubin Urine: NEGATIVE
Glucose, UA: NEGATIVE mg/dL
Hgb urine dipstick: NEGATIVE
KETONES UR: NEGATIVE mg/dL
Nitrite: POSITIVE — AB
PH: 5 (ref 5.0–8.0)
Protein, ur: 30 mg/dL — AB
SPECIFIC GRAVITY, URINE: 1.031 — AB (ref 1.005–1.030)
WBC, UA: >50 WBC/hpf — ABNORMAL HIGH (ref 0–5)

## 2018-02-18 LAB — TYPE AND SCREEN
ABO/RH(D): O POS
ANTIBODY SCREEN: NEGATIVE

## 2018-02-18 LAB — PROCALCITONIN: PROCALCITONIN: 0.12 ng/mL

## 2018-02-18 LAB — I-STAT ARTERIAL BLOOD GAS, ED
Acid-base deficit: 3 mmol/L — ABNORMAL HIGH (ref 0.0–2.0)
BICARBONATE: 19.6 mmol/L — AB (ref 20.0–28.0)
O2 Saturation: 97 %
TCO2: 20 mmol/L — ABNORMAL LOW (ref 22–32)
pCO2 arterial: 27.1 mmHg — ABNORMAL LOW (ref 32.0–48.0)
pH, Arterial: 7.471 — ABNORMAL HIGH (ref 7.350–7.450)
pO2, Arterial: 83 mmHg (ref 83.0–108.0)

## 2018-02-18 LAB — GLUCOSE, CAPILLARY: Glucose-Capillary: 225 mg/dL — ABNORMAL HIGH (ref 70–99)

## 2018-02-18 LAB — I-STAT CG4 LACTIC ACID, ED
LACTIC ACID, VENOUS: 1.48 mmol/L (ref 0.5–1.9)
Lactic Acid, Venous: 1.28 mmol/L (ref 0.5–1.9)

## 2018-02-18 LAB — HEMOGLOBIN A1C
HEMOGLOBIN A1C: 8.1 % — AB (ref 4.8–5.6)
MEAN PLASMA GLUCOSE: 185.77 mg/dL

## 2018-02-18 LAB — BRAIN NATRIURETIC PEPTIDE: B Natriuretic Peptide: 47.8 pg/mL (ref 0.0–100.0)

## 2018-02-18 LAB — I-STAT TROPONIN, ED: TROPONIN I, POC: 0 ng/mL (ref 0.00–0.08)

## 2018-02-18 IMAGING — DX DG CHEST 2V
2 series · 2 of 2 positions shown · non-contrast
Comparison: [DATE]

CLINICAL DATA: Hypoxia, cough

EXAM:
CHEST - 2 VIEW

[chest lat]
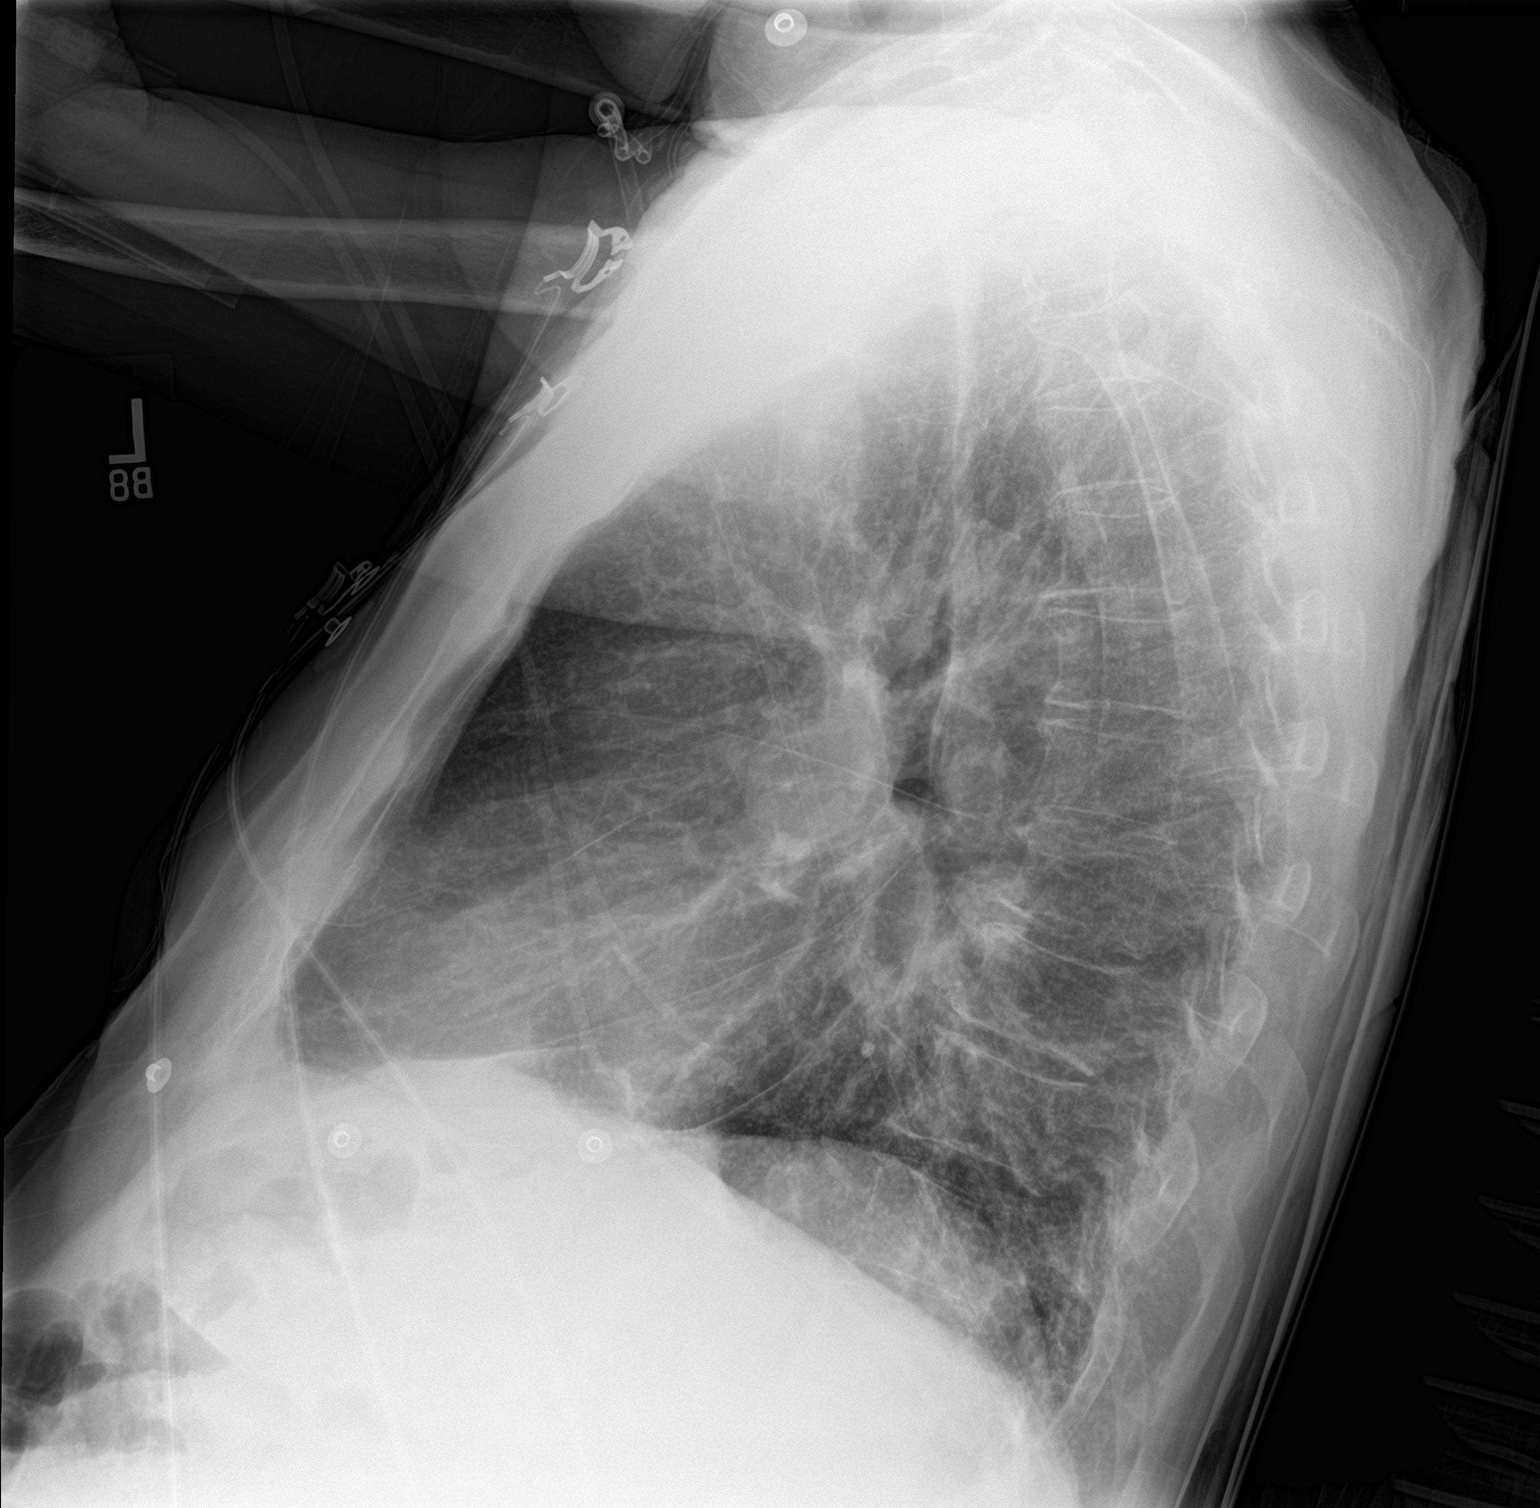

[chest ap]
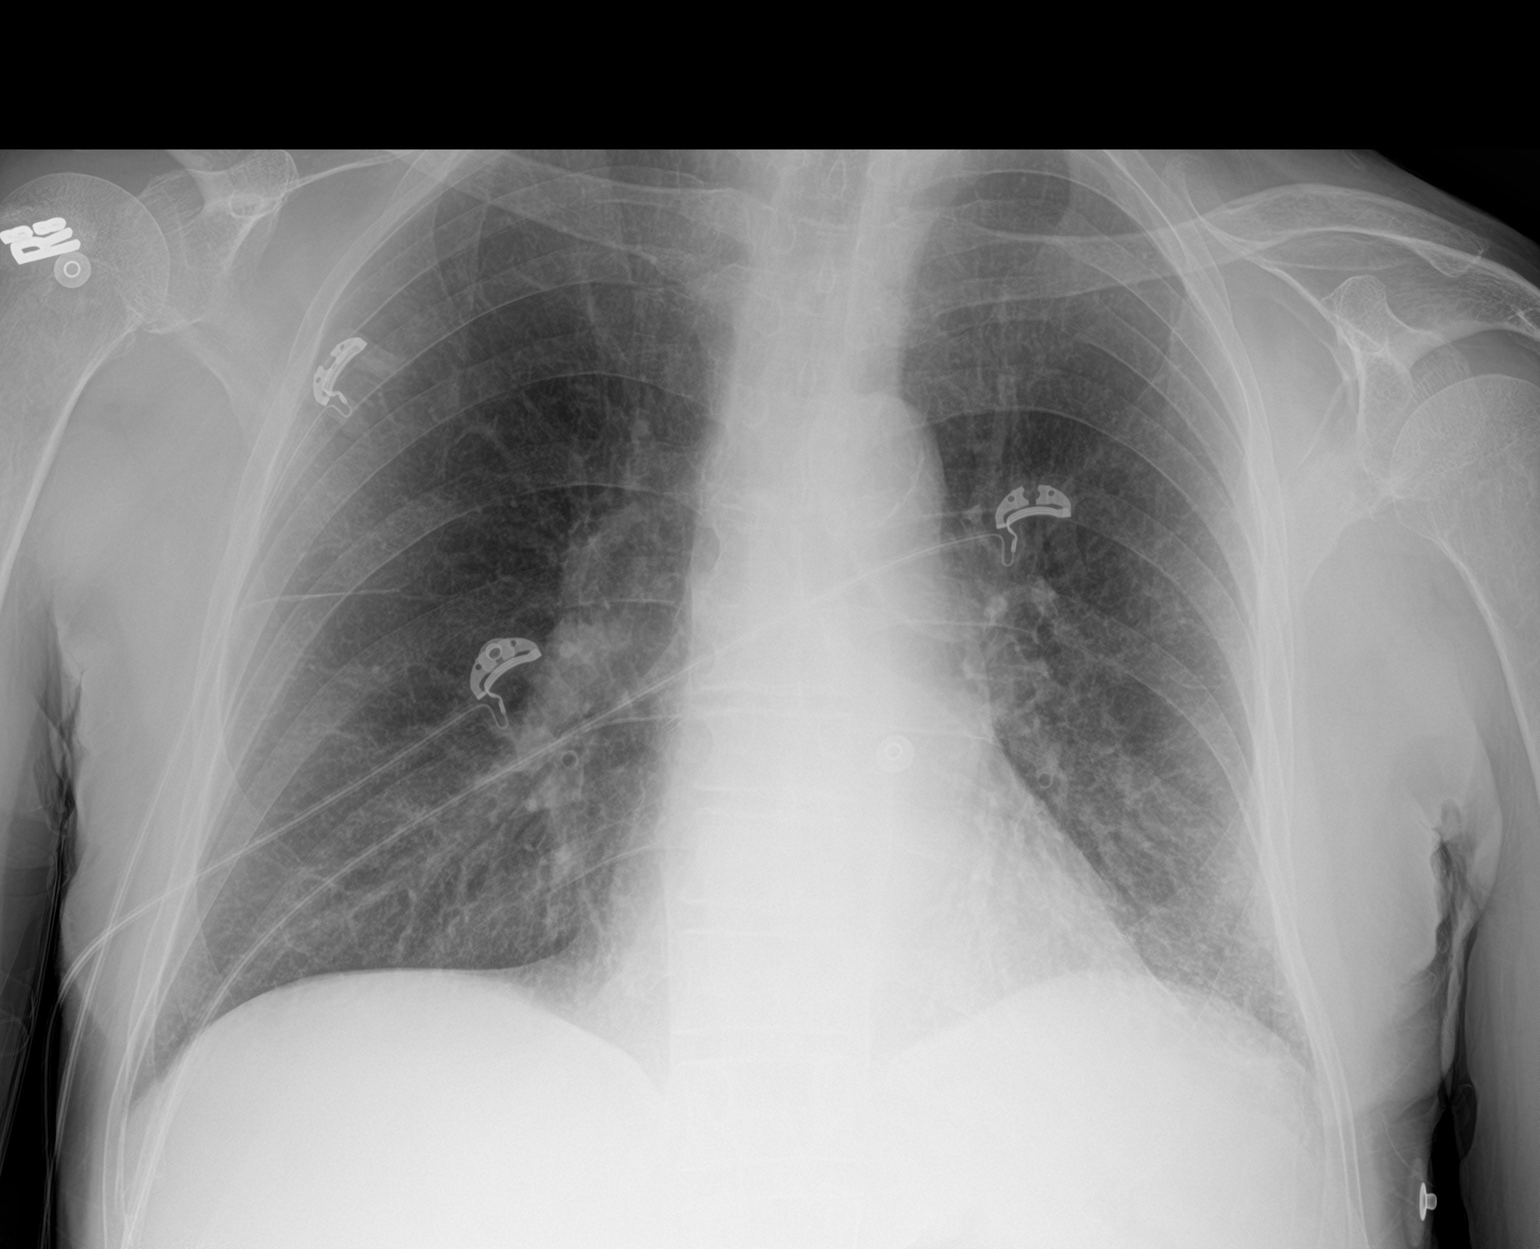

[2 of 2 positions shown; findings below may reference images not displayed]

FINDINGS: Patchy airspace disease noted in the left lower lobe concerning for
pneumonia. Right lung clear. Heart is normal size. No effusions or
acute bony abnormality.
IMPRESSION: Patchy left lower lobe opacity concerning for pneumonia.

## 2018-02-18 MED ORDER — ENOXAPARIN SODIUM 40 MG/0.4ML ~~LOC~~ SOLN
40.0000 mg | SUBCUTANEOUS | Status: DC
  Administered 2018-02-18 – 2018-02-19 (×2): 40 mg via SUBCUTANEOUS
  Filled 2018-02-18 (×2): qty 0.4

## 2018-02-18 MED ORDER — ZOLPIDEM TARTRATE 5 MG PO TABS
5.0000 mg | ORAL_TABLET | Freq: Every evening | ORAL | Status: DC | PRN
  Administered 2018-02-18 – 2018-02-19 (×2): 5 mg via ORAL
  Filled 2018-02-18 (×2): qty 1

## 2018-02-18 MED ORDER — SODIUM CHLORIDE 0.9 % IV BOLUS
1000.0000 mL | Freq: Once | INTRAVENOUS | Status: AC
  Administered 2018-02-18: 1000 mL via INTRAVENOUS

## 2018-02-18 MED ORDER — PIPERACILLIN-TAZOBACTAM 3.375 G IVPB 30 MIN
3.3750 g | Freq: Once | INTRAVENOUS | Status: AC
  Administered 2018-02-18: 3.375 g via INTRAVENOUS
  Filled 2018-02-18: qty 50

## 2018-02-18 MED ORDER — VANCOMYCIN HCL 10 G IV SOLR
1500.0000 mg | Freq: Once | INTRAVENOUS | Status: AC
  Administered 2018-02-18: 1500 mg via INTRAVENOUS
  Filled 2018-02-18: qty 1500

## 2018-02-18 MED ORDER — PIPERACILLIN-TAZOBACTAM 3.375 G IVPB
3.3750 g | Freq: Three times a day (TID) | INTRAVENOUS | Status: DC
  Administered 2018-02-18 – 2018-02-19 (×3): 3.375 g via INTRAVENOUS
  Filled 2018-02-18 (×4): qty 50

## 2018-02-18 MED ORDER — METHOTREXATE 2.5 MG PO TABS
30.0000 mg | ORAL_TABLET | ORAL | Status: DC
  Administered 2018-02-19: 30 mg via ORAL
  Filled 2018-02-18: qty 12

## 2018-02-18 MED ORDER — ACETAMINOPHEN 325 MG PO TABS
650.0000 mg | ORAL_TABLET | Freq: Four times a day (QID) | ORAL | Status: DC | PRN
  Administered 2018-02-18 – 2018-02-19 (×2): 650 mg via ORAL
  Filled 2018-02-18 (×2): qty 2

## 2018-02-18 MED ORDER — VANCOMYCIN HCL IN DEXTROSE 1-5 GM/200ML-% IV SOLN
1000.0000 mg | Freq: Two times a day (BID) | INTRAVENOUS | Status: DC
  Administered 2018-02-18 – 2018-02-19 (×2): 1000 mg via INTRAVENOUS
  Filled 2018-02-18 (×2): qty 200

## 2018-02-18 MED ORDER — TRAMADOL HCL 50 MG PO TABS: 50.0000 mg | ORAL_TABLET | Freq: Four times a day (QID) | ORAL | Status: DC | PRN

## 2018-02-18 MED ORDER — INSULIN ASPART 100 UNIT/ML ~~LOC~~ SOLN
0.0000 [IU] | Freq: Every day | SUBCUTANEOUS | Status: DC
  Administered 2018-02-18: 2 [IU] via SUBCUTANEOUS
  Administered 2018-02-20: 3 [IU] via SUBCUTANEOUS

## 2018-02-18 MED ORDER — VANCOMYCIN HCL IN DEXTROSE 1-5 GM/200ML-% IV SOLN: 1000.0000 mg | Freq: Once | INTRAVENOUS | Status: DC

## 2018-02-18 MED ORDER — BUDESONIDE 3 MG PO CPEP
3.0000 mg | ORAL_CAPSULE | Freq: Every day | ORAL | Status: DC | PRN
  Filled 2018-02-18: qty 1

## 2018-02-18 MED ORDER — INSULIN ASPART 100 UNIT/ML ~~LOC~~ SOLN
0.0000 [IU] | Freq: Three times a day (TID) | SUBCUTANEOUS | Status: DC
  Administered 2018-02-19: 8 [IU] via SUBCUTANEOUS
  Administered 2018-02-20: 3 [IU] via SUBCUTANEOUS
  Administered 2018-02-20: 5 [IU] via SUBCUTANEOUS
  Administered 2018-02-20: 3 [IU] via SUBCUTANEOUS

## 2018-02-18 MED ORDER — CYANOCOBALAMIN 500 MCG PO TABS
500.0000 ug | ORAL_TABLET | Freq: Every day | ORAL | Status: DC
  Administered 2018-02-18 – 2018-02-21 (×4): 500 ug via ORAL
  Filled 2018-02-18 (×4): qty 1

## 2018-02-18 MED ORDER — INSULIN GLARGINE 100 UNIT/ML ~~LOC~~ SOLN
20.0000 [IU] | Freq: Every day | SUBCUTANEOUS | Status: DC
  Administered 2018-02-18 – 2018-02-20 (×3): 20 [IU] via SUBCUTANEOUS
  Filled 2018-02-18 (×3): qty 0.2

## 2018-02-18 NOTE — ED Provider Notes (Signed)
[facility] EMERGENCY DEPARTMENT Provider Note   CSN: [id number] Arrival date & time: 02/18/18  0932     History   Chief Complaint Chief Complaint  Patient presents with  . Fatigue    HPI Tommy Peterson is a 77 y.o. male who presents to the ER with malaise and dysuria.   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  The patient states that around midnight last night he began having burning with urination followed by urgency, frequency and decreased urinary output.  He states that he feels "horrible" and that the last time that this occurred he was septic.  He denies flank pains, fevers.  He does endorse a slight cough.  He denies chest pain or shortness of breath.  He denies rashes.  He was notably hypoxic on room air.  He denies a history of hypoxia or oxygen dependence.  He is currently satting at about 93% on 3 L of oxygen.  HPI  Past Medical History:  Diagnosis Date  . BPH (benign prostatic hyperplasia)   . Carpal tunnel syndrome on both sides   . Chronic diarrhea    intermittant due to chronic collagenous colitis  . Chronic large granular lymphocytic leukemia ([place]) primary hemotologist-  [name]. 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 Collagenous colitis    chronic--- intermittant between diarrahea and constipation  . ED (erectile dysfunction)   . History of adenomatous polyp of colon   . History of Clostridium difficile colitis 05/2016  . History of pneumonia 11/08/2017   CAP -- [facility]---  12-19-2017  per pt no residual symptoms  . History of SCC (squamous cell carcinoma) of skin   . History of sepsis    11-20-2017  severe sepsis due to UTI;  10/ 2017sepsis due to c-diff colitis  . Leucocytosis    chronic  . Lower urinary tract symptoms (LUTS)   . Macrocytic anemia    since 02/ 2015  . MGUS (monoclonal gammopathy of unknown significance)    hemotologist-  [name] @ [facility]  . Monoclonal paraproteinemia   . Neuropathy, peripheral   . OA (osteoarthritis)   . Prostatic stone   . Rash of face    RIGHT SIDE  . Raynaud's syndrome   . Thin skin    fragile  . Transfusion-dependent anemia since 10/ 2017   last transfusion PRBCs 07-19-2017  per hemologist note dated 10-25-2017  . Type 2 diabetes mellitus treated with insulin ([place])    followed by [name] ([facility])  . Urinary hesitancy   . Wears contact lenses    LEFT EYE ONLY    Patient Active Problem List   Diagnosis Date Noted  . MGUS (monoclonal gammopathy of unknown significance) 11/23/2017  . BPH (benign prostatic hyperplasia) 11/23/2017  . Left lower lobe pneumonia ([place]) 11/20/2017  . Nausea vomiting and diarrhea 11/20/2017  . Leukemia not having achieved remission ([place]) 12/26/2016  . Anemia 12/26/2016  . Large granular lymphocytic leukemia ([place]) 12/26/2016  . Diabetes mellitus with complication ([place])   . Symptomatic anemia   . DOE (dyspnea on exertion)   . Anxiety   . History of GI bleed   . C. difficile colitis 05/31/2016  . Lactic  acidosis   . History of bone marrow biopsy 05/27/2016  . Fever 05/27/2016  . Encephalopathy in sepsis 05/27/2016  . Sepsis ([place]) 05/27/2016  . Chest pain 05/04/2016  . Benign fibroma of prostate 09/20/2015  . HLD (hyperlipidemia) 09/20/2015  . BP (high blood pressure) 09/20/2015  . Arthritis, degenerative 09/20/2015  . Colitis, collagenous 09/09/2015  . Absolute anemia 09/08/2014  . Adenoma of large intestine 07/13/2014  . Disturbance of skin sensation 01/09/2014  . Diabetes ([place]) 06/24/2013  . Recurrent major depressive disorder, in full remission ([place]) 07/18/2012  . Allergic rhinitis 08/29/2011     Past Surgical History:  Procedure Laterality Date  . BONE MARROW BIOPSY Right 05-24-2016;  08-20-2014  . CARDIOVASCULAR STRESS TEST  11-26-2012   [name]  @ [place] ([place]   normal nuclear study w/ no ischemia/  normal LV function and wall motion , ef 60% (find in care everywhere,epic)  . CATARACT EXTRACTION W/ INTRAOCULAR LENS  IMPLANT, BILATERAL  2015  . CYSTOSCOPY WITH LITHOLAPAXY N/A 12/25/2017   Procedure: CYSTOSCOPY WITH LITHOLAPAXY AND FULGERATION;  Surgeon: [name], MD;  Location: [facility];  Service: Urology;  Laterality: N/A;  . INGUINAL HERNIA REPAIR Right 1980s  . PENILE PROSTHESIS IMPLANT  12-02-2015   dr [name] @ [facility] in [place]  . SHOULDER SURGERY Left 08/2016  . TONSILLECTOMY  child  . TRANSURETHRAL RESECTION OF PROSTATE  2009   AND REPAIR RECURRENT RIGHT INGUINAL HERNIA        Home Medications    Prior to Admission medications   Medication Sig Start Date End Date Taking? Authorizing Provider  acetaminophen (TYLENOL) 325 MG tablet Take 2 tablets (650 mg total) by mouth every 6 (six) hours as needed for mild pain (or Fever >/= 101). 11/24/17   Elgergawy, Silver Huguenin, MD  Artificial Tear Ointment (DRY EYES OP) Apply to eye as needed.    [provider]  budesonide (ENTOCORT EC) 3 MG 24 hr capsule Take 3 mg by mouth daily as needed (for diarrhea).     [provider]  Insulin Glargine-Lixisenatide (SOLIQUA) 100-33 UNT-MCG/ML SOPN Inject 25 Units into the skin at bedtime.     [provider]  methotrexate (RHEUMATREX) 2.5 MG tablet Take 30 mg by mouth once a week.  07/19/17   [provider]  pioglitazone (ACTOS) 15 MG tablet Take 15 mg by mouth every evening.  10/18/17   [provider]  traMADol (ULTRAM) 50 MG tablet Take 1 tablet (50 mg total) by mouth every 6 (six) hours as needed. 12/25/17 12/25/18  [name], MD  vitamin B-12 (CYANOCOBALAMIN) 500 MCG tablet Take 1  tablet (500 mcg total) by mouth daily. 11/24/17   Elgergawy, Silver Huguenin, MD    Family History Family History  Problem Relation Age of Onset  . Diabetes Mother   . Heart failure Mother     Social History Social History   Tobacco Use  . Smoking status: Former Smoker    Years: 20.00    Types: Cigarettes    Last attempt to quit: 05/04/1996    Years since quitting: 21.8  . Smokeless tobacco: Never Used  Substance Use Topics  . Alcohol use: Yes    Comment: 1-2 glasses of wine every other day  . Drug use: No     Allergies   Bee venom; Metformin and related; and Sulfamethoxazole-trimethoprim   Review of Systems Review of Systems  Ten systems reviewed and are negative  for acute change, except as noted in the HPI.   Physical Exam Updated Vital Signs SpO2 95% Comment: O2 RA 90%  Physical Exam  Constitutional: He is oriented to person, place, and time. He appears well-developed. He appears ill.  HENT:  Head: Normocephalic and atraumatic.  Eyes: Pupils are equal, round, and reactive to light. EOM are normal.  Neck: Normal range of motion. Neck supple. No JVD present. No thyromegaly present.  Cardiovascular: Normal rate and regular rhythm.  Pulmonary/Chest: Effort normal and breath sounds normal. No stridor. No respiratory distress. He has no wheezes. He has no rales.  Abdominal: Soft. Bowel sounds are normal. There is no tenderness. There is no guarding.  Musculoskeletal: Normal range of motion. He exhibits no edema or deformity.  Neurological: He is alert and oriented to person, place, and time. He displays normal reflexes. No cranial nerve deficit. He exhibits normal muscle tone. Coordination normal.  Skin: Skin is warm and dry.  Nursing note and vitals reviewed.    ED Treatments / Results  Labs (all labs ordered are listed, but only abnormal results are displayed) Labs Reviewed  COMPREHENSIVE METABOLIC PANEL - Abnormal; Notable for the following components:      Result  Value   Glucose, Bld 219 (*)    Calcium 8.5 (*)    Total Protein 6.2 (*)    Albumin 3.4 (*)    All other components within normal limits  CBC WITH DIFFERENTIAL/PLATELET - Abnormal; Notable for the following components:   WBC 25.7 (*)    RBC 2.18 (*)    Hemoglobin 8.8 (*)    HCT 26.8 (*)    MCV 122.9 (*)    MCH 40.4 (*)    RDW 16.1 (*)    Neutro Abs 20.0 (*)    Monocytes Absolute 3.6 (*)    All other components within normal limits  I-STAT ARTERIAL BLOOD GAS, ED - Abnormal; Notable for the following components:   pH, Arterial 7.471 (*)    pCO2 arterial 27.1 (*)    Bicarbonate 19.6 (*)    TCO2 20 (*)    Acid-base deficit 3.0 (*)    All other components within normal limits  CULTURE, BLOOD (ROUTINE X 2)  CULTURE, BLOOD (ROUTINE X 2)  CULTURE, EXPECTORATED SPUTUM-ASSESSMENT  GRAM STAIN  URINE CULTURE  BRAIN NATRIURETIC PEPTIDE  PROCALCITONIN  URINALYSIS, ROUTINE W REFLEX MICROSCOPIC  HIV ANTIBODY (ROUTINE TESTING)  STREP PNEUMONIAE URINARY ANTIGEN  HEMOGLOBIN A1C  I-STAT CG4 LACTIC ACID, ED  I-STAT TROPONIN, ED  I-STAT CG4 LACTIC ACID, ED  TYPE AND SCREEN    EKG EKG Interpretation  Date/Time:  Monday February 18 2018 10:47:29 EDT Ventricular Rate:  87 PR Interval:  176 QRS Duration: 104 QT Interval:  382 QTC Calculation: 459 R Axis:   -36 Text Interpretation:  Sinus rhythm with marked sinus arrhythmia Left axis deviation Abnormal ECG No significant change since last tracing Confirmed by Wandra Arthurs [phone]) on 02/18/2018 11:27:09 AM   Radiology Dg Chest 2 View  Result Date: 02/18/2018 CLINICAL DATA:  Hypoxia, cough EXAM: CHEST - 2 VIEW COMPARISON:  11/21/2017 FINDINGS: Patchy airspace disease noted in the left lower lobe concerning for pneumonia. Right lung clear. Heart is normal size. No effusions or acute bony abnormality. IMPRESSION: Patchy left lower lobe opacity concerning for pneumonia. Electronically Signed   By: Rolm Baptise M.D.   On: 02/18/2018 12:05     Procedures .Critical Care Performed by: Margarita Mail, PA-C Authorized by: Margarita Mail, PA-C  Critical care provider statement:    Critical care time (minutes):  50   Critical care was necessary to treat or prevent imminent or life-threatening deterioration of the following conditions:  Sepsis   Critical care was time spent personally by me on the following activities:  Development of treatment plan with patient or surrogate, discussions with consultants, evaluation of patient's response to treatment, examination of patient, interpretation of cardiac output measurements, obtaining history from patient or surrogate, review of old charts, re-evaluation of patient's condition, pulse oximetry, ordering and review of radiographic studies, ordering and review of laboratory studies and ordering and performing treatments and interventions   (including critical care time)  Medications Ordered in ED Medications  piperacillin-tazobactam (ZOSYN) IVPB 3.375 g (has no administration in time range)  vancomycin (VANCOCIN) IVPB 1000 mg/200 mL premix (has no administration in time range)  insulin aspart (novoLOG) injection 0-15 Units (has no administration in time range)  insulin aspart (novoLOG) injection 0-5 Units (has no administration in time range)  insulin glargine (LANTUS) injection 20 Units (has no administration in time range)  piperacillin-tazobactam (ZOSYN) IVPB 3.375 g (3.375 g Intravenous New Bag/Given 02/18/18 1030)  sodium chloride 0.9 % bolus 1,000 mL (1,000 mLs Intravenous New Bag/Given 02/18/18 1029)  vancomycin (VANCOCIN) 1,500 mg in sodium chloride 0.9 % 500 mL IVPB (1,500 mg Intravenous New Bag/Given 02/18/18 1052)     Initial Impression / Assessment and Plan / ED Course  I have reviewed the triage vital signs and the nursing notes.  Pertinent labs & imaging results that were available during my care of the patient were reviewed by me and considered in my medical decision  making (see chart for details).     Patient with sepsis appears secondary to HAP. broadspectrum abx and code sepsis initiated.White count elevated and his anemia is worsened. Patient will be admitted to the hospitalist service.  Final Clinical Impressions(s) / ED Diagnoses   Final diagnoses:  HAP (hospital-acquired pneumonia)  Immunocompromised patient [facility])  Hyperglycemia    ED Discharge Orders    None       Margarita Mail, PA-C 02/18/18 1356    Drenda Freeze, MD 02/19/18 [phone]

## 2018-02-18 NOTE — H&P (Signed)
History and Physical    Tommy Peterson KTG:[id number] DOB: 25-Jan-1941 DOA: 02/18/2018  PCP: Jani Gravel, MD Patient coming from: home  Chief Complaint: fever/fatigue/dysuria  HPI: Tommy Peterson is a 77 y.o. 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 Initial evaluation reveals sepsis acute respiratory failure with hypoxia likely related to a left lower lobe pneumonia and possible urinary tract infection.[facility] are asked to admit  Information is obtained from the patient and his chart. He states last night he began to feel poorly. He describes dysuria with dark urine. Associated symptoms include frequency and a sensation of being unable to empty his bladder. He also reports that fever of 103.he also reports decreased appetite and oral intake. He denies headache dizziness syncope or near-syncope. He denies chest pain palpitations shortness of breathor cough. He denies abdominal pain nausea vomiting diarrhea constipation melena bright red blood per rectum. He states the last time he had these symptoms he was in the hospital with urinary tract infection. Oxygen saturation level high 80s on room air at presentation.  ED Course: emergency department is afebrile hemodynamically stable with an oxygen saturation level 92% on 3 L nasal cannula. He is provided with IV fluids and broad-spectrum antibiotics as well as oxygen supplementation.   Review of Systems: As per HPI otherwise all other systems reviewed and are negative.   Ambulatory Status: ambulates independently is independent with ADLs  Past Medical History:  Diagnosis Date  . Acute respiratory failure ([place])   . BPH (benign prostatic hyperplasia)   . Carpal tunnel syndrome on both sides   . Chronic diarrhea     intermittant due to chronic collagenous colitis  . Chronic large granular lymphocytic leukemia ([place]) primary hemotologist-  [name]. 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 Collagenous colitis    chronic--- intermittant between diarrahea and constipation  . ED (erectile dysfunction)   . HCAP (healthcare-associated pneumonia)   . History of adenomatous polyp of colon   . History of Clostridium difficile colitis 05/2016  . History of pneumonia 11/08/2017   CAP -- LLL---  12-19-2017  per pt no residual symptoms  . History of SCC (squamous cell carcinoma) of skin   . History of sepsis    11-20-2017 severe sepsis due to UTI;  10/ 2017sepsis due to c-diff colitis  . Leucocytosis    chronic  . Lower urinary tract symptoms (LUTS)   . Macrocytic anemia    since 02/ 2015  . MGUS (monoclonal gammopathy of unknown significance)    hemotologist-  [name] @ [facility]  . Monoclonal paraproteinemia   . Neuropathy, peripheral   . OA (osteoarthritis)   . Prostatic stone   . Rash of face    RIGHT SIDE  . Raynaud's syndrome   . Thin skin    fragile  . Transfusion-dependent anemia since 10/ 2017   last transfusion PRBCs 07-19-2017  per hemologist note dated 10-25-2017  . Type 2 diabetes mellitus treated with insulin ([place])    followed by [name] (pcp)  . Urinary hesitancy   . Wears contact lenses    LEFT EYE ONLY    Past Surgical History:  Procedure  Laterality Date  . BONE MARROW BIOPSY Right 05-24-2016;  08-20-2014  . CARDIOVASCULAR STRESS TEST  11-26-2012   [name]  @ [place] ([place]   normal nuclear study w/ no ischemia/  normal LV function and wall motion , ef 60% (find in care everywhere,epic)  . CATARACT EXTRACTION W/ INTRAOCULAR LENS  IMPLANT,  BILATERAL  2015  . CYSTOSCOPY WITH LITHOLAPAXY N/A 12/25/2017   Procedure: CYSTOSCOPY WITH LITHOLAPAXY AND FULGERATION;  Surgeon: [name], MD;  Location: [facility];  Service: Urology;  Laterality: N/A;  . INGUINAL HERNIA REPAIR Right 1980s  . PENILE PROSTHESIS IMPLANT  12-02-2015   dr [name] @ [facility] in [place]  . SHOULDER SURGERY Left 08/2016  . TONSILLECTOMY  child  . TRANSURETHRAL RESECTION OF PROSTATE  2009   AND REPAIR RECURRENT RIGHT INGUINAL HERNIA    Social History   Socioeconomic History  . Marital status: Widowed    Spouse name: engaged  . Number of children: 8  . Years of education: college-2  . Highest education level: Not on file  Occupational History  . Occupation: Press photographer  . Occupation: [employer]  Social Needs  . Financial resource strain: Not on file  . Food insecurity:    Worry: Not on file    Inability: Not on file  . Transportation needs:    Medical: Not on file    Non-medical: Not on file  Tobacco Use  . Smoking status: Former Smoker    Years: 20.00    Types: Cigarettes    Last attempt to quit: 05/04/1996    Years since quitting: 21.8  . Smokeless tobacco: Never Used  Substance and Sexual Activity  . Alcohol use: Yes    Comment: 1-2 glasses of wine every other day  . Drug use: No  . Sexual activity: Never  Lifestyle  . Physical activity:    Days per week: Not on file    Minutes per session: Not on file  . Stress: Not on file  Relationships  . Social connections:    Talks on phone: Not on file    Gets together: Not on file    Attends religious service: Not on file    Active member of club or organization: Not on file    Attends meetings of clubs or organizations: Not on file    Relationship status: Not on file  . Intimate partner violence:    Fear of current or ex partner: Not on file    Emotionally abused: Not on file    Physically abused: Not on file    Forced sexual activity: Not on file    Other Topics Concern  . Not on file  Social History Narrative  . Not on file    Allergies  Allergen Reactions  . Bee Venom Swelling  . Metformin And Related Nausea And Vomiting  . Sulfamethoxazole-Trimethoprim Nausea And Vomiting    Family History  Problem Relation Age of Onset  . Diabetes Mother   . Heart failure Mother     Prior to Admission medications   Medication Sig Start Date End Date Taking? Authorizing Provider  acetaminophen (TYLENOL) 325 MG tablet Take 2 tablets (650 mg total) by mouth every 6 (six) hours as needed for mild pain (or Fever >/= 101). 11/24/17   Elgergawy, Silver Huguenin, MD  Artificial Tear Ointment (DRY EYES OP) Apply to eye as needed.    [provider]  budesonide (ENTOCORT EC) 3 MG  24 hr capsule Take 3 mg by mouth daily as needed (for diarrhea).     [provider]  Insulin Glargine-Lixisenatide (SOLIQUA) 100-33 UNT-MCG/ML SOPN Inject 25 Units into the skin at bedtime.     [provider]  methotrexate (RHEUMATREX) 2.5 MG tablet Take 30 mg by mouth once a week.  07/19/17   [provider]  pioglitazone (ACTOS) 15 MG tablet Take 15 mg by mouth every evening.  10/18/17   [provider]  traMADol (ULTRAM) 50 MG tablet Take 1 tablet (50 mg total) by mouth every 6 (six) hours as needed. 12/25/17 12/25/18  [name], MD  vitamin B-12 (CYANOCOBALAMIN) 500 MCG tablet Take 1 tablet (500 mcg total) by mouth daily. 11/24/17   Elgergawy, Silver Huguenin, MD    Physical Exam: Vitals:   02/18/18 1145 02/18/18 1200 02/18/18 1215 02/18/18 1230  BP: (!) 108/53 (!) 116/57 (!) 112/54 (!) 124/57  Pulse: 77 74 78 85  Resp: 19 (!) 22 17 (!) 24  SpO2: 100% 100% 99% 99%  Weight:      Height:         General:  Appears calm and comfortable Eyes:  PERRL, EOMI, normal lids, iris ENT:  grossly normal hearing, lips & tongue, mmm Neck:  no LAD, masses or thyromegaly Cardiovascular:  RRR, no m/r/g. No LE edema.  Respiratory:  CTA  bilaterally, no w/r/r. Normal respiratory effort. Abdomen:  soft, ntnd, NABS Skin:  no rash or induration seen on limited exam Musculoskeletal:  grossly normal tone BUE/BLE, good ROM, no bony abnormality Psychiatric:  grossly normal mood and affect, speech fluent and appropriate, AOx3 Neurologic:  CN 2-12 grossly intact, moves all extremities in coordinated fashion, sensation intact  Labs on Admission: I have personally reviewed following labs and imaging studies  CBC: Recent Labs  Lab 02/18/18 0958  WBC 25.7*  NEUTROABS 20.0*  HGB 8.8*  HCT 26.8*  MCV 122.9*  PLT 389   Basic Metabolic Panel: Recent Labs  Lab 02/18/18 0958  NA 137  K 3.8  CL 108  CO2 22  GLUCOSE 219*  BUN 16  CREATININE 0.66  CALCIUM 8.5*   GFR: Estimated Creatinine Clearance: 79.8 mL/min (by C-G formula based on SCr of 0.66 mg/dL). Liver Function Tests: Recent Labs  Lab 02/18/18 0958  AST 19  ALT 15  ALKPHOS 80  BILITOT 1.1  PROT 6.2*  ALBUMIN 3.4*   No results for input(s): LIPASE, AMYLASE in the last 168 hours. No results for input(s): AMMONIA in the last 168 hours. Coagulation Profile: No results for input(s): INR, PROTIME in the last 168 hours. Cardiac Enzymes: No results for input(s): CKTOTAL, CKMB, CKMBINDEX, TROPONINI in the last 168 hours. BNP (last 3 results) No results for input(s): PROBNP in the last 8760 hours. HbA1C: No results for input(s): HGBA1C in the last 72 hours. CBG: No results for input(s): GLUCAP in the last 168 hours. Lipid Profile: No results for input(s): CHOL, HDL, LDLCALC, TRIG, CHOLHDL, LDLDIRECT in the last 72 hours. Thyroid Function Tests: No results for input(s): TSH, T4TOTAL, FREET4, T3FREE, THYROIDAB in the last 72 hours. Anemia Panel: No results for input(s): VITAMINB12, FOLATE, FERRITIN, TIBC, IRON, RETICCTPCT in the last 72 hours. Urine analysis:    Component Value Date/Time   COLORURINE YELLOW 11/20/2017 0303   APPEARANCEUR CLEAR 11/20/2017  0303   LABSPEC 1.011 11/20/2017 0303   PHURINE 7.0 11/20/2017 0303   GLUCOSEU >=500 (A) 11/20/2017 0303   HGBUR NEGATIVE 11/20/2017 0303   BILIRUBINUR NEGATIVE  11/20/2017 0303   BILIRUBINUR negative 10/29/2017 1428   KETONESUR 5 (A) 11/20/2017 0303   PROTEINUR NEGATIVE 11/20/2017 0303   UROBILINOGEN 0.2 10/29/2017 1428   NITRITE NEGATIVE 11/20/2017 0303   LEUKOCYTESUR TRACE (A) 11/20/2017 0303    Creatinine Clearance: Estimated Creatinine Clearance: 79.8 mL/min (by C-G formula based on SCr of 0.66 mg/dL).  Sepsis Labs: '@LABRCNTIP'$ (procalcitonin:4,lacticidven:4) )No results found for this or any previous visit (from the past 240 hour(s)).   Radiological Exams on Admission: Dg Chest 2 View  Result Date: 02/18/2018 CLINICAL DATA:  Hypoxia, cough EXAM: CHEST - 2 VIEW COMPARISON:  11/21/2017 FINDINGS: Patchy airspace disease noted in the left lower lobe concerning for pneumonia. Right lung clear. Heart is normal size. No effusions or acute bony abnormality. IMPRESSION: Patchy left lower lobe opacity concerning for pneumonia. Electronically Signed   By: Rolm Baptise M.D.   On: 02/18/2018 12:05    EKG:  independantly reviewedSinus rhythm with marked sinus arrhythmia Left axis deviation Abnormal ECG No significant change  Assessment/Plan Principal Problem:   Acute respiratory failure with hypoxia (HCC) Active Problems:   Sepsis (HCC)   Left lower lobe pneumonia (HCC)   Diabetes (HCC)   Absolute anemia   Large granular lymphocytic leukemia (HCC)   MGUS (monoclonal gammopathy of unknown significance)   BPH (benign prostatic hyperplasia)   Anxiety   #1.acute respiratory failure with hypoxia likely related to left lower lobe pneumonia. Since saturation level 93% on 3 L nasal cannula. Reportedly oxygen saturation level 89% on room air at rest upon presentation. -admit -Continue oxygen supplementation -Monitor oxygen saturation level -Wean oxygen as able -When necessary  nebulizers -Follow blood cultures -Obtain sputum cultures -Antibiotics per healthcare associated pneumonia given immunocompromised -Track lactic acid -Gentle IV fluids  #2. Left lower lobe pneumonia. Patient with a history of same. Chest x-ray as noted above. Patient with fever leukocytosis soft blood pressure mild tachypnea. Of note hospitalized 2 months ago for UTI. At that time chest x-ray revealed chronic interstitial changes with new patchy left basilar opacity suspicious for pneumonia. -Follow blood cultures -Obtain sputum culture -Strep pneumo urine antigen -Antibiotics for immunocompromise -See #1  #3. BPH with history of urinary urgency and dysuria. Patient also with a history of urinary retention.urinalysis with large leukocytes positive nitrites greater than 50 WBCs and rare bacteria. Patient hospitalized 2 months ago with staff epi that was resistant to fluoroquinolones. Chart review indicates he was given doxycycline with significant improvement of his symptoms and was discharged for 10 day treatment. Blood cultures were negative at that time. Addition evaluated by urology in May of this year underwent cystolitholapaxy of 2.5cm stone. -Gentle IV fluids -Post void bladder scan -Follow urinalysis -follow urine culture -monitor intake and output -Follow blood culture -Continue broad-spectrum antibiotics for pneumonia for now  #4. Sepsis. Likely related to above. Reported fever 103, leukocytosis, elevated lactic acid, hypoxia. Pro-calcitonin 0.12 Patient is provided with IV fluids and broad-spectrum antibiotics. -Continue IV fluids -Track lactic acid -antibiotics as noted above -Monitor closely  #5.diabetes. Poorly controlled. Recent A1c 8.5. Medications include Actos andsoliqua.serum glucose 219. -We'll hold Actos for now -Continue long-acting insulin at a slightly lower dose than his home dose -Sliding scale insulin for optimal control  #6. MGUS, chronic granular  lymphocytic leukemia -resume home meds  #7. Anemia in the setting of MGUS and H cc. Hemoglobin 8.8. Patient with a history of transfusion-dependent anemia related to above. Last transfusion [id number]. Hemoglobin 8.8.no signs symptoms active bleeding -Monitor -If hemoglobin drops below 8  consider transfusion    DVT prophylaxis: lovenox Code Status: full  Family Communication: none present  Disposition Plan: home  Consults called: none  Admission status: obs    Dyanne Carrel M MD [facility]  If 7PM-7AM, please contact night-coverage [email or web address] Password [email or web address]  02/18/2018, 1:57 PM

## 2018-02-18 NOTE — Progress Notes (Signed)
Pharmacy Antibiotic Note  Brondon Wann is a 77 y.o. male admitted on 02/18/2018 with sepsis.  Pharmacy has been consulted for vancomycin and zosyn dosing. Pt with elevated WBC and lactic acid is <2. Scr is WNL.   Plan: Vancomycin 1500mg  IV x 1 then 1gm IV Q12H Zosyn 3.375gm IV Q8H (4 hr inf) F/u renal fxn, C&S, clinical status and trough at SS  Height: 5\' 10"  (177.8 cm) Weight: 162 lb (73.5 kg) IBW/kg (Calculated) : 73  No data recorded.  Recent Labs  Lab 02/18/18 0958 02/18/18 1006  WBC 25.7*  --   CREATININE 0.66  --   LATICACIDVEN  --  1.48    Estimated Creatinine Clearance: 79.8 mL/min (by C-G formula based on SCr of 0.66 mg/dL).    Allergies  Allergen Reactions  . Bee Venom Swelling  . Metformin And Related Nausea And Vomiting  . Sulfamethoxazole-Trimethoprim Nausea And Vomiting    Antimicrobials this admission: Vanc 7/15>> Zosyn 7/15>>  Dose adjustments this admission: N/A  Microbiology results: Pending  Thank you for allowing pharmacy to be a part of this patient's care.  Sahana Boyland, Rande Lawman 02/18/2018 11:06 AM

## 2018-02-18 NOTE — ED Triage Notes (Signed)
In from home via [facility], per report pt started having generalized weakness onset last night with fever last night, pt reports oliguria since last night, recent UTI x 3 mths, A&O x4, c/o generalized weakness

## 2018-02-18 NOTE — ED Notes (Signed)
Sent note to pharmacy to verify tylenol

## 2018-02-18 NOTE — ED Notes (Signed)
Harris, [place] speaking with the pt re: plan of care

## 2018-02-18 NOTE — ED Notes (Signed)
Bladder scan completed, 111 ml resulted.

## 2018-02-18 NOTE — ED Notes (Signed)
Pt ambulatory to bathroom with one assist.

## 2018-02-19 DIAGNOSIS — E119 Type 2 diabetes mellitus without complications: Secondary | ICD-10-CM

## 2018-02-19 LAB — BASIC METABOLIC PANEL
ANION GAP: 10 (ref 5–15)
BUN: 12 mg/dL (ref 8–23)
CHLORIDE: 105 mmol/L (ref 98–111)
CO2: 21 mmol/L — ABNORMAL LOW (ref 22–32)
Calcium: 8.4 mg/dL — ABNORMAL LOW (ref 8.9–10.3)
Creatinine, Ser: 0.52 mg/dL — ABNORMAL LOW (ref 0.61–1.24)
Glucose, Bld: 91 mg/dL (ref 70–99)
POTASSIUM: 3.6 mmol/L (ref 3.5–5.1)
SODIUM: 136 mmol/L (ref 135–145)

## 2018-02-19 LAB — CBC
HCT: 26.4 % — ABNORMAL LOW (ref 39.0–52.0)
Hemoglobin: 8.7 g/dL — ABNORMAL LOW (ref 13.0–17.0)
MCH: 41.4 pg — ABNORMAL HIGH (ref 26.0–34.0)
MCHC: 33 g/dL (ref 30.0–36.0)
MCV: 125.7 fL — ABNORMAL HIGH (ref 78.0–100.0)
PLATELETS: 340 10*3/uL (ref 150–400)
RBC: 2.1 MIL/uL — ABNORMAL LOW (ref 4.22–5.81)
RDW: 16.6 % — ABNORMAL HIGH (ref 11.5–15.5)
WBC: 27.2 10*3/uL — AB (ref 4.0–10.5)

## 2018-02-19 LAB — GLUCOSE, CAPILLARY
GLUCOSE-CAPILLARY: 87 mg/dL (ref 70–99)
GLUCOSE-CAPILLARY: 123 mg/dL — AB (ref 70–99)
Glucose-Capillary: 282 mg/dL — ABNORMAL HIGH (ref 70–99)
Glucose-Capillary: 165 mg/dL — ABNORMAL HIGH (ref 70–99)

## 2018-02-19 MED ORDER — METOPROLOL TARTRATE 5 MG/5ML IV SOLN
5.0000 mg | Freq: Once | INTRAVENOUS | Status: AC
  Administered 2018-02-19: 5 mg via INTRAVENOUS
  Filled 2018-02-19: qty 5

## 2018-02-19 MED ORDER — SODIUM CHLORIDE 0.9 % IV SOLN
3.0000 g | Freq: Three times a day (TID) | INTRAVENOUS | Status: DC
  Administered 2018-02-19 – 2018-02-21 (×6): 3 g via INTRAVENOUS
  Filled 2018-02-19 (×7): qty 3

## 2018-02-19 MED ORDER — METOPROLOL TARTRATE 25 MG PO TABS
25.0000 mg | ORAL_TABLET | Freq: Two times a day (BID) | ORAL | Status: DC
Start: 2018-02-19 — End: 2018-02-20
  Administered 2018-02-19 – 2018-02-20 (×2): 25 mg via ORAL
  Filled 2018-02-19 (×2): qty 1

## 2018-02-19 NOTE — Progress Notes (Addendum)
  PROGRESS NOTE  Tommy Peterson YSA:[id number] DOB: [birth year] DOA: 02/18/2018 PCP: Jani Gravel, MD  Brief Narrative: 77 year old man presented with shortness of breath.  Admitted for lobar pneumonia with associated acute hypoxic respiratory failure.  Assessment/Plan Left lobar pneumonia with associated acute hypoxic respiratory failure.  Sepsis was considered on admission, however did not meet criteria: No lactic acidosis, evidence of acute lung injury, altered mental status, hypotension or DIC. --No significant change in WBC.  However afebrile and hemodynamics are stable. --Hypoxia has resolved --Likely narrow antibiotics 7/17 if improving to Unasyn today, no history of Pseudomonas.  UTI, symptomatic --History of multiple infections in the past --Follow-up culture data  DM type 2 --CBG stable.  Anion gap within normal limits. --Continue sliding scale insulin.  Large granular lymphocytic leukemia on MTX followed at [facility], anemia, MGUS --Hemoglobin stable at 8.7. --Follow-up as an outpatient  ADDENDUM New onset afib RVR this afternoon, asymptomatic, vitals otherwise stable. HR low 100s after IV metoprolol. Discussed with pt, refused enoxaparin. Will check echo, TSH in AM and start oral metoprolol.   Overall improved.  Wants to go home tomorrow if possible.  DVT prophylaxis: enoxaparin Code Status: Full Family Communication: none Disposition Plan: home    Murray Hodgkins, MD  [facility] Direct contact: [phone] --Via amion app OR  --[email or web address]; password [email or web address]  7PM-7AM contact night coverage as above 02/19/2018, 1:09 PM  LOS: 1 day   Consultants:    Procedures:    Antimicrobials:  Zosyn 7/15 >  Vancomycin 7/15 >  Interval history/Subjective: Feels much better.  Breathing fine.  Still has some dysuria.  Objective: Vitals:  Vitals:   02/18/18 2333 02/19/18 0919  BP: (!) 113/56 (!) 113/56  Pulse: (!) 101 86  Resp: (!) 23 (!) 26  Temp: 99.3 F  (37.4 C) 99.5 F (37.5 C)  SpO2: 97% 93%    Exam:  Constitutional:  . Appears calm and comfortable Respiratory:  . CTA bilaterally anteriorly, bilateral crackles posteriorly at bases, no w/r/r  . Respiratory effort normal Cardiovascular:  . RRR, no m/r/g . No LE extremity edema   Abdomen:  . Soft Psychiatric:  . Mental status o Mood, affect appropriate  I have personally reviewed the following:   Labs:  Basic metabolic panel unremarkable  WBC 27.2, hemoglobin 8.7  Imaging studies:  Chest x-ray left lower lobe opacity  Scheduled Meds: . enoxaparin (LOVENOX) injection  40 mg Subcutaneous Q24H  . insulin aspart  0-15 Units Subcutaneous TID WC  . insulin aspart  0-5 Units Subcutaneous QHS  . insulin glargine  20 Units Subcutaneous QHS  . vitamin B-12  500 mcg Oral Daily   Continuous Infusions: . vancomycin 1,000 mg (02/19/18 1305)    Principal Problem:   Left lower lobe pneumonia (HCC) Active Problems:   Large granular lymphocytic leukemia (HCC)   MGUS (monoclonal gammopathy of unknown significance)   Acute respiratory failure with hypoxia (HCC)   Acute lower UTI   DM type 2 (diabetes mellitus, type 2) ([place])   LOS: 1 day

## 2018-02-19 NOTE — Progress Notes (Signed)
Telemetry notified me of cardiac rhythm change.  MD paged and notified.  EKG ordered and done, a-fib with RVR, MD ordered IV metoprolol and PO, both were given.  Pt states he does not want to be on blood thinners, education on a-fib and medication completed.  Pt states he will see how it goes tomorrow.

## 2018-02-19 NOTE — Care Management Note (Signed)
Case Management Note  Patient Details  Name: Tommy Peterson MRN: [id number] Date of Birth: [birth year]  Subjective/Objective:   From home with family, presents with sepsis secondary to uti, pna, acute resp failure with hypoxia, bph, hx of lymphocytic leukemia.                  Action/Plan: NCM will follow for dc needs.  Expected Discharge Date:  02/21/18               Expected Discharge Plan:  Home/Self Care  In-House Referral:     Discharge planning Services  CM Consult  Post Acute Care Choice:    Choice offered to:     DME Arranged:    DME Agency:     HH Arranged:    HH Agency:     Status of Service:  In process, will continue to follow  If discussed at Long Length of Stay Meetings, dates discussed:    Additional Comments:  Zenon Mayo, RN 02/19/2018, 10:40 AM

## 2018-02-19 NOTE — Progress Notes (Signed)
Pharmacy Antibiotic Note  Tommy Peterson is a 77 y.o. male admitted on 02/18/2018 with sepsis.  Pharmacy has been consulted for Unasyn dosing.  Day #2 of abx for sepsis. Possible UTI and PNA. CXR concerning for LLL PNA. Tmax of 102.8, WBC up to 27.2.  Plan: Stop vancomycin and Zosyn Start Unasyn 3g IV Q8h Monitor clinical picture, renal function F/U C&S, abx deescalation / LOT  Height: 5\' 10"  (177.8 cm) Weight: 163 lb 7.2 oz (74.1 kg) IBW/kg (Calculated) : 73  Temp (24hrs), Avg:101 F (38.3 C), Min:99.3 F (37.4 C), Max:102.8 F (39.3 C)  Recent Labs  Lab 02/18/18 0958 02/18/18 1006 02/18/18 1238 02/19/18 0140  WBC 25.7*  --   --  27.2*  CREATININE 0.66  --   --  0.52*  LATICACIDVEN  --  1.48 1.28  --     Estimated Creatinine Clearance: 79.8 mL/min (A) (by C-G formula based on SCr of 0.52 mg/dL (L)).    Allergies  Allergen Reactions  . Bee Venom Swelling  . Metformin And Related Nausea And Vomiting  . Sulfamethoxazole-Trimethoprim Nausea And Vomiting    Thank you for allowing pharmacy to be a part of this patient's care.  Elenor Quinones, PharmD, BCPS Clinical Pharmacist Phone number [phone] 02/19/2018 1:17 PM

## 2018-02-20 ENCOUNTER — Inpatient Hospital Stay (HOSPITAL_COMMUNITY): Payer: Medicare HMO

## 2018-02-20 DIAGNOSIS — C91Z0 Other lymphoid leukemia not having achieved remission: Secondary | ICD-10-CM

## 2018-02-20 DIAGNOSIS — I361 Nonrheumatic tricuspid (valve) insufficiency: Secondary | ICD-10-CM

## 2018-02-20 DIAGNOSIS — J181 Lobar pneumonia, unspecified organism: Principal | ICD-10-CM

## 2018-02-20 DIAGNOSIS — N390 Urinary tract infection, site not specified: Secondary | ICD-10-CM

## 2018-02-20 DIAGNOSIS — E119 Type 2 diabetes mellitus without complications: Secondary | ICD-10-CM

## 2018-02-20 DIAGNOSIS — J9601 Acute respiratory failure with hypoxia: Secondary | ICD-10-CM

## 2018-02-20 DIAGNOSIS — I480 Paroxysmal atrial fibrillation: Secondary | ICD-10-CM

## 2018-02-20 LAB — CBC
HCT: 26.7 % — ABNORMAL LOW (ref 39.0–52.0)
HEMOGLOBIN: 8.7 g/dL — AB (ref 13.0–17.0)
MCH: 41.2 pg — ABNORMAL HIGH (ref 26.0–34.0)
MCHC: 32.6 g/dL (ref 30.0–36.0)
MCV: 126.5 fL — ABNORMAL HIGH (ref 78.0–100.0)
Platelets: UNDETERMINED 10*3/uL (ref 150–400)
RBC: 2.11 MIL/uL — AB (ref 4.22–5.81)
RDW: 15.9 % — ABNORMAL HIGH (ref 11.5–15.5)
WBC: 13.1 10*3/uL — AB (ref 4.0–10.5)

## 2018-02-20 LAB — URINE CULTURE: Culture: 70000 — AB

## 2018-02-20 LAB — GLUCOSE, CAPILLARY
GLUCOSE-CAPILLARY: 178 mg/dL — AB (ref 70–99)
Glucose-Capillary: 177 mg/dL — ABNORMAL HIGH (ref 70–99)
Glucose-Capillary: 244 mg/dL — ABNORMAL HIGH (ref 70–99)
Glucose-Capillary: 277 mg/dL — ABNORMAL HIGH (ref 70–99)

## 2018-02-20 LAB — TSH: TSH: 0.991 u[IU]/mL (ref 0.350–4.500)

## 2018-02-20 LAB — ECHOCARDIOGRAM COMPLETE
Height: 70 in
WEIGHTICAEL: 2615.18 [oz_av]

## 2018-02-20 MED ORDER — APIXABAN 5 MG PO TABS
5.0000 mg | ORAL_TABLET | Freq: Two times a day (BID) | ORAL | Status: DC
  Administered 2018-02-20 – 2018-02-21 (×3): 5 mg via ORAL
  Filled 2018-02-20 (×2): qty 1

## 2018-02-20 MED ORDER — METOPROLOL TARTRATE 25 MG PO TABS
25.0000 mg | ORAL_TABLET | Freq: Once | ORAL | Status: AC
  Administered 2018-02-20: 25 mg via ORAL
  Filled 2018-02-20: qty 1

## 2018-02-20 MED ORDER — METOPROLOL TARTRATE 50 MG PO TABS
50.0000 mg | ORAL_TABLET | Freq: Two times a day (BID) | ORAL | Status: DC
Start: 2018-02-20 — End: 2018-02-21
  Administered 2018-02-20 – 2018-02-21 (×2): 50 mg via ORAL
  Filled 2018-02-20 (×2): qty 1

## 2018-02-20 NOTE — Consult Note (Signed)
   [facility] CM Inpatient Consult   02/20/2018  Tommy Peterson [birth year] [id number]    Patient screened for potential [facility] Care Management needs due unplanned readmission risk score of 22% (high).  Went to bedside to speak with [name] about [facility] Management program services.   He pleasantly declines [facility] Care Management program. States " I follow up closely with my doctors. I do not need any added confusion to my care with calls and when I get home".   Accepted [facility] Care Management brochure with 24-hr nurse advice line magnet. Expressed appreciation of visit.  Made inpatient RNCM aware [place] Management services were declined.   Marthenia Rolling, MSN-Ed, RN,BSN [facility] Liaison [phone]

## 2018-02-20 NOTE — Progress Notes (Signed)
  Echocardiogram 2D Echocardiogram has been performed.  Tommy Peterson 02/20/2018, 3:54 PM

## 2018-02-20 NOTE — Progress Notes (Signed)
PROGRESS NOTE    Tommy Peterson  AST:[id number] DOB: 13-Mar-1941 DOA: 02/18/2018 PCP: Jani Gravel, MD      Brief Narrative:  Tommy Peterson is a 77 y.o. 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     Assessment & Plan:  Left lobar pneumonia with associated acute hypoxic respiratory failure -Continue Unasyn -Continue flutter valve  Urinary tract infection Urine culture with pan-susceptible E coli. -Continue Unasyn  New onset atrial fibrillation, paroxysmal This is a new diagnosis.  CHA2DS2-VASc Score = 4 (age, HTN, DM).  HASBLED only 1.  Recommend anticoagulation.    TSH and K normal.  Heart rate up today. -Follow up echo -Start apixaban -Increase metoprolol  Diabetes -Continue glargine and SSI  Large granular lymphocytic leukemia -Hold methotrexate  Hypertension This is a chart history in [place], he is not on antihypertensivse.  Chronic normocytic anemia Stable relative to baseline  Chronic diarrhea -Continue Entocort       DVT prophylaxis: N/A on Eliquis Code Status: FULL Family Communication: None presetn MDM and disposition Plan: The below labs and imaging reports were reviewed and summarized above.    The patient was admitted with fever, found to have pneumonia and pyelonephritis, then new onset atrial fibrillation.  PSA 117 risk class IV. in setting of cancer, methotrexate.  Still febrile overnight last night.     Consultants:   None  Procedures:   None  Antimicrobials:   Vancomycin 7/15>> 7/16  Zosyn 7/15 >> 7/16  Unasyn7/16 >>     Subjective: Fever gone.  Feels "fine".  No chest pain, cough, sputum.  No dyspnea.  No vomiting.  No dysuria, flank pain, hematuria.  Objective: Vitals:   02/20/18 0843 02/20/18 1400 02/20/18 1600 02/20/18 1607  BP: 96/62  109/65 119/60  Pulse: (!) 126  (!) 104 (!) 102 89  Resp: 20 18  14   Temp: 98.5 F (36.9 C)   98.4 F (36.9 C)  TempSrc: Oral   Oral  SpO2: 96%   97%  Weight:      Height:        Intake/Output Summary (Last 24 hours) at 02/20/2018 1706 Last data filed at 02/20/2018 1600 Gross per 24 hour  Intake 847.11 ml  Output 350 ml  Net 497.11 ml   Filed Weights   02/18/18 1053 02/18/18 1820  Weight: 73.5 kg (162 lb) 74.1 kg (163 lb 7.2 oz)    Examination: General appearance:  adult male, alert and in no acute distress.  In bed HEENT: Anicteric, conjunctiva pink, lids and lashes normal. No nasal deformity, discharge, epistaxis.  Lips moist, teeth normal, oropharynx moist, no oral lesions, hearing normal.   Skin: Warm and dry.  No jaundice.  No suspicious rashes or lesions. Cardiac: RRR, nl S1-S2, no murmurs appreciated.  Capillary refill is brisk.  JVP normal.  No LE edema.  Radia  pulses 2+ and symmetric. Respiratory: Normal respiratory rate and rhythm.  Coarse breath sounds at bilateral bases, no rales, no wheezes Abdomen: Abdomen soft.  no TTP. No ascites, distension, hepatosplenomegaly.   MSK: No deformities or effusions. Neuro: Awake and alert.  EOMI, moves all extremities. Speech fluent.    Psych: Sensorium intact and responding to questions, attention normal. Affect normal.  Judgment and insight appear normal.    Data Reviewed: I have personally reviewed following labs and imaging studies:  CBC: Recent  Labs  Lab 02/18/18 0958 02/19/18 0140 02/20/18 0254  WBC 25.7* 27.2* 13.1*  NEUTROABS 20.0*  --   --   HGB 8.8* 8.7* 8.7*  HCT 26.8* 26.4* 26.7*  MCV 122.9* 125.7* 126.5*  PLT 337 340 PLATELET CLUMPS NOTED ON SMEAR, UNABLE TO ESTIMATE   Basic Metabolic Panel: Recent Labs  Lab 02/18/18 0958 02/19/18 0140  NA 137 136  K 3.8 3.6  CL 108 105  CO2 22 21*  GLUCOSE 219* 91  BUN 16 12  CREATININE 0.66 0.52*  CALCIUM 8.5* 8.4*   GFR: Estimated Creatinine Clearance: 79.8 mL/min (A) (by C-G formula  based on SCr of 0.52 mg/dL (L)). Liver Function Tests: Recent Labs  Lab 02/18/18 0958  AST 19  ALT 15  ALKPHOS 80  BILITOT 1.1  PROT 6.2*  ALBUMIN 3.4*   No results for input(s): LIPASE, AMYLASE in the last 168 hours. No results for input(s): AMMONIA in the last 168 hours. Coagulation Profile: No results for input(s): INR, PROTIME in the last 168 hours. Cardiac Enzymes: No results for input(s): CKTOTAL, CKMB, CKMBINDEX, TROPONINI in the last 168 hours. BNP (last 3 results) No results for input(s): PROBNP in the last 8760 hours. HbA1C: Recent Labs    02/18/18 1829  HGBA1C 8.1*   CBG: Recent Labs  Lab 02/19/18 1653 02/19/18 2213 02/20/18 0839 02/20/18 1212 02/20/18 1630  GLUCAP 282* 165* 178* 177* 244*   Lipid Profile: No results for input(s): CHOL, HDL, LDLCALC, TRIG, CHOLHDL, LDLDIRECT in the last 72 hours. Thyroid Function Tests: Recent Labs    02/20/18 0254  TSH 0.991   Anemia Panel: No results for input(s): VITAMINB12, FOLATE, FERRITIN, TIBC, IRON, RETICCTPCT in the last 72 hours. Urine analysis:    Component Value Date/Time   COLORURINE AMBER (A) 02/18/2018 1249   APPEARANCEUR CLOUDY (A) 02/18/2018 1249   LABSPEC 1.031 (H) 02/18/2018 1249   PHURINE 5.0 02/18/2018 1249   GLUCOSEU NEGATIVE 02/18/2018 1249   HGBUR NEGATIVE 02/18/2018 1249   BILIRUBINUR NEGATIVE 02/18/2018 1249   BILIRUBINUR negative 10/29/2017 1428   KETONESUR NEGATIVE 02/18/2018 1249   PROTEINUR 30 (A) 02/18/2018 1249   UROBILINOGEN 0.2 10/29/2017 1428   NITRITE POSITIVE (A) 02/18/2018 1249   LEUKOCYTESUR LARGE (A) 02/18/2018 1249   Sepsis Labs: @LABRCNTIP (procalcitonin:4,lacticacidven:4)  ) Recent Results (from the past 240 hour(s))  Blood Culture (routine x 2)     Status: None (Preliminary result)   Collection Time: 02/18/18  9:58 AM  Result Value Ref Range Status   Specimen Description BLOOD BLOOD LEFT HAND  Final   Special Requests   Final    BOTTLES DRAWN AEROBIC AND  ANAEROBIC Blood Culture results may not be optimal due to an excessive volume of blood received in culture bottles   Culture   Final    NO GROWTH 2 DAYS Performed at [facility], [place] [address], [place], [place] [address]    Report Status PENDING  Incomplete  Blood Culture (routine x 2)     Status: None (Preliminary result)   Collection Time: 02/18/18  9:59 AM  Result Value Ref Range Status   Specimen Description BLOOD SITE NOT SPECIFIED  Final   Special Requests   Final    BOTTLES DRAWN AEROBIC AND ANAEROBIC Blood Culture results may not be optimal due to an excessive volume of blood received in culture bottles   Culture   Final    NO GROWTH 2 DAYS Performed at [facility], [place],  [place] [address]    Report Status PENDING  Incomplete  Culture, Urine     Status: Abnormal   Collection Time: 02/18/18  1:44 PM  Result Value Ref Range Status   Specimen Description URINE, RANDOM  Final   Special Requests   Final    NONE Performed at [facility], [place] [address], [place], [place] [address]    Culture 70,000 COLONIES/mL ESCHERICHIA COLI (A)  Final   Report Status 02/20/2018 FINAL  Final   Organism ID, Bacteria ESCHERICHIA COLI (A)  Final      Susceptibility   Escherichia coli - MIC*    AMPICILLIN 4 SENSITIVE Sensitive     CEFAZOLIN <=4 SENSITIVE Sensitive     CEFTRIAXONE <=1 SENSITIVE Sensitive     CIPROFLOXACIN <=0.25 SENSITIVE Sensitive     GENTAMICIN <=1 SENSITIVE Sensitive     IMIPENEM <=0.25 SENSITIVE Sensitive     NITROFURANTOIN <=16 SENSITIVE Sensitive     TRIMETH/SULFA <=20 SENSITIVE Sensitive     AMPICILLIN/SULBACTAM <=2 SENSITIVE Sensitive     PIP/TAZO <=4 SENSITIVE Sensitive     Extended ESBL NEGATIVE Sensitive     * 70,000 COLONIES/mL ESCHERICHIA COLI         Radiology Studies: No results found.      Scheduled Meds: . apixaban  5 mg Oral BID  . insulin aspart  0-15 Units Subcutaneous TID WC  . insulin aspart  0-5  Units Subcutaneous QHS  . insulin glargine  20 Units Subcutaneous QHS  . metoprolol tartrate  50 mg Oral BID  . vitamin B-12  500 mcg Oral Daily   Continuous Infusions: . ampicillin-sulbactam (UNASYN) IV 200 mL/hr at 02/20/18 1600     LOS: 2 days    Time spent: 35 minutes    Edwin Dada, MD [facility] 02/20/2018, 5:06 PM     Pager [phone] --- please page though AMION:  [email or web address] Password [email or web address] If 7PM-7AM, please contact night-coverage

## 2018-02-20 NOTE — Plan of Care (Signed)
  Problem: Clinical Measurements: Goal: Diagnostic test results will improve Outcome: Progressing Pts diagnostic labs have improved with antibiotics. Pts WBC has decreased from 27 down to 13.1 Goal: Respiratory complications will improve Outcome: Progressing Pt has maintained O2 saturations >92% throughout the shift. No respiratory distress noted.   Problem: Education: Goal: Knowledge of General Education information will improve Outcome: Progressing  Pt was educated on the purpose of an oral blood thinner to help prevent blood clots from forming while in atrial fibrillation. Pt had opportunity to ask questions. Pt verbalized understanding and displayed teach back, however it is unclear if pt truly understands the risk of not being on a blood thinner. Pt states he wants to talk to his PCP about it and then decide. Pt was willing to take lovenox subQ for VTE tonight.

## 2018-02-21 LAB — BASIC METABOLIC PANEL
ANION GAP: 8 (ref 5–15)
BUN: 8 mg/dL (ref 8–23)
CALCIUM: 8.3 mg/dL — AB (ref 8.9–10.3)
CHLORIDE: 106 mmol/L (ref 98–111)
CO2: 25 mmol/L (ref 22–32)
CREATININE: 0.53 mg/dL — AB (ref 0.61–1.24)
GFR calc Af Amer: >60 mL/min (ref >60)
GFR calc non Af Amer: >60 mL/min (ref >60)
GLUCOSE: 136 mg/dL — AB (ref 70–99)
Potassium: 3.4 mmol/L — ABNORMAL LOW (ref 3.5–5.1)
Sodium: 139 mmol/L (ref 135–145)

## 2018-02-21 LAB — CBC
HEMATOCRIT: 24 % — AB (ref 39.0–52.0)
Hemoglobin: 8 g/dL — ABNORMAL LOW (ref 13.0–17.0)
MCH: 41.2 pg — ABNORMAL HIGH (ref 26.0–34.0)
MCHC: 33.3 g/dL (ref 30.0–36.0)
MCV: 123.7 fL — AB (ref 78.0–100.0)
Platelets: 308 10*3/uL (ref 150–400)
RBC: 1.94 MIL/uL — AB (ref 4.22–5.81)
RDW: 15.5 % (ref 11.5–15.5)
WBC: 10.1 10*3/uL (ref 4.0–10.5)

## 2018-02-21 LAB — GLUCOSE, CAPILLARY: Glucose-Capillary: 99 mg/dL (ref 70–99)

## 2018-02-21 MED ORDER — METOPROLOL TARTRATE 50 MG PO TABS: 50.0000 mg | ORAL_TABLET | Freq: Two times a day (BID) | ORAL | 3 refills | Status: DC

## 2018-02-21 MED ORDER — AMOXICILLIN-POT CLAVULANATE 875-125 MG PO TABS: 1.0000 | ORAL_TABLET | Freq: Two times a day (BID) | ORAL | 0 refills | Status: AC

## 2018-02-21 MED ORDER — AMOXICILLIN 500 MG PO CAPS: 500.0000 mg | ORAL_CAPSULE | Freq: Three times a day (TID) | ORAL | 0 refills | Status: DC

## 2018-02-21 MED ORDER — APIXABAN 5 MG PO TABS: 5.0000 mg | ORAL_TABLET | Freq: Two times a day (BID) | ORAL | 3 refills | Status: DC

## 2018-02-21 NOTE — Discharge Summary (Signed)
Physician Discharge Summary  Kierre Hintz WUJ:[id number] DOB: Jun 20, 1941 DOA: 02/18/2018  PCP: Jani Gravel, MD  Admit date: 02/18/2018 Discharge date: 02/21/2018  Admitted From: Home  Disposition:  Home   Recommendations for Outpatient Follow-up:  1. Follow up with PCP in 1-2 weeks 2. Follow up with Cardiology in 3-4 weeks 3. Follow up with Urology in 3-4 weeks 4. Follow up with Hematology as scheduled 5. Obtain CBC in 2-4 weeks   Home Health: None, patient declined  Equipment/Devices: None  Discharge Condition: Good  CODE STATUS: FULL Diet recommendation: Diabetic  Brief/Interim Summary: Mr. Aleman is a 77 y.o. 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          Discharge Diagnoses:   Left lobar pneumonia with associated acute hypoxic respiratory failure Initial ED notes were that patient had fever and cough, and CXR showed LLL opacity, so patient started on empiric CAP coverage.  Later, patient adamant he had no respiratory symptoms, only fever, chills, dysuria, similar to previous UTI. -Discharged to complete 7 days Augmentin    Urinary tract infection Patient with previous nephroliths.  No flank pain nor hematuria at this time.   Urine culture with pan-susceptible E coli.  Will complete total 14 days amoxicillin for complicated UTI. -Follow up with Urology  New onset atrial fibrillation, paroxysmal This was an incidental finding and new diagnosis.  Patient was noted on telemetry to have Afib.  ECG below.  CHA2DS2-VASc Score =4 (age, HTN, DM), 4.8% stroke risk per year.  HASBLED only 1. TSH and K normal.  Heart rate up today.  Echo with normal EF, normal valves.   -Started on apixaban and metoprolol -Spontaneously converted after 24 hours with metoprolol alone  Diabetes No change to home regimen.  Large granular  lymphocytic leukemia Previously had transfusion dependent anemia from his leukemia, although since starting methotrexate his Hgb baseline has been stable close to 10 g/dL he reports.  Currently slightly lower, near 8 g/dL.  Needs close follow up of Hgb given newly starting anticoagulant.  Discussed with patient.  Hypertension This is a chart history in [place], he is not on antihypertensives.  Chronic normocytic anemia Stable relative to baseline  Chronic diarrhea No change to regimen.       Discharge Instructions  Discharge Instructions    Ambulatory referral to Cardiology   Complete by:  As directed    Diet - low sodium heart healthy   Complete by:  As directed    Discharge instructions   Complete by:  As directed    From Dr. Loleta Books: You were admitted with fever and infection. It appears your infection was either from pneumonia or a bladder infection or both.  To treat the pneumonia: Take Augmentin 875-125 mg twice daily for three more days starting Thursday night Starting Monday morning, take amoxicillin 500 mg twice daily for 7 more days, then stop Call Dr. Ralene Muskrat office at [facility] for a follow up appointment Skip your methotrexate dose next Tuesday   For your new Atrial fibrillation: I have sent a referral to Dr. Donley Redder office, to ask them to schedule this follow up Take metoprolol 50 mg twice daily (this will keep the heart rate slow) Take apixaban/Eliquis 5 mg twice daily (this thins the blood)  Get your blood counts checked (a "complete blood count") in few weeks at your hematologist's  office The blood thinner shouldn't cause your blood counts to drop, unless you are silently oozing blood from somewhere. If you aren't scheduled with them within 3 weeks, call for a sooner blood level check. If you have black and tarry stools, red blood in your stools, or you feel weak and dizzy and out of breath with walking, call your doctor sooner.    Increase activity slowly   Complete by:  As directed      Allergies as of 02/21/2018      Reactions   Bee Venom Swelling   Metformin And Related Nausea And Vomiting   Sulfamethoxazole-trimethoprim Nausea And Vomiting      Medication List    TAKE these medications   acetaminophen 325 MG tablet Commonly known as:  TYLENOL Take 2 tablets (650 mg total) by mouth every 6 (six) hours as needed for mild pain (or Fever >/= 101).   amoxicillin 500 MG capsule Commonly known as:  AMOXIL Take 1 capsule (500 mg total) by mouth 3 (three) times daily.   amoxicillin-clavulanate 875-125 MG tablet Commonly known as:  AUGMENTIN Take 1 tablet by mouth 2 (two) times daily for 7 doses.   apixaban 5 MG Tabs tablet Commonly known as:  ELIQUIS Take 1 tablet (5 mg total) by mouth 2 (two) times daily.   budesonide 3 MG 24 hr capsule Commonly known as:  ENTOCORT EC Take 3 mg by mouth at bedtime.   methotrexate 2.5 MG tablet Commonly known as:  RHEUMATREX Take 30 mg by mouth every Tuesday.   metoprolol tartrate 50 MG tablet Commonly known as:  LOPRESSOR Take 1 tablet (50 mg total) by mouth 2 (two) times daily.   pioglitazone 15 MG tablet Commonly known as:  ACTOS Take 15 mg by mouth every morning.   SOLIQUA 100-33 UNT-MCG/ML Sopn Generic drug:  Insulin Glargine-Lixisenatide Inject 28 Units into the skin at bedtime.   traMADol 50 MG tablet Commonly known as:  ULTRAM Take 1 tablet (50 mg total) by mouth every 6 (six) hours as needed.   vitamin B-12 500 MCG tablet Commonly known as:  CYANOCOBALAMIN Take 1 tablet (500 mcg total) by mouth daily.   vitamin C 1000 MG tablet Take 1,000 mg by mouth daily.       Allergies  Allergen Reactions  . Bee Venom Swelling  . Metformin And Related Nausea And Vomiting  . Sulfamethoxazole-Trimethoprim Nausea And Vomiting    Consultations:  None   Procedures/Studies: Dg Chest 2 View  Result Date: 02/18/2018 CLINICAL DATA:  Hypoxia, cough  EXAM: CHEST - 2 VIEW COMPARISON:  11/21/2017 FINDINGS: Patchy airspace disease noted in the left lower lobe concerning for pneumonia. Right lung clear. Heart is normal size. No effusions or acute bony abnormality. IMPRESSION: Patchy left lower lobe opacity concerning for pneumonia. Electronically Signed   By: Rolm Baptise M.D.   On: 02/18/2018 12:05       Subjective: Feels back to normal today.  No chest pain, dyspnea.  No palpitations.  No cough, sputum.  No more dysuria, no flank pain, no hematuria.  Discharge Exam: Vitals:   02/20/18 2347 02/21/18 0738  BP: (!) 105/49 (!) 119/55  Pulse: 65 63  Resp: 17 17  Temp: 99.3 F (37.4 C) 98.3 F (36.8 C)  SpO2: 96% 97%   Vitals:   02/20/18 1600 02/20/18 1607 02/20/18 2347 02/21/18 0738  BP: 109/65 119/60 (!) 105/49 (!) 119/55  Pulse: (!) 102 89 65 63  Resp:  14 17 17   Temp:  98.4 F (36.9 C) 99.3 F (37.4 C) 98.3 F (36.8 C)  TempSrc:  Oral Oral Oral  SpO2:  97% 96% 97%  Weight:      Height:        General: Pt is alert, awake, not in acute distress Cardiovascular: RRR, S1/S2 +, no rubs, no gallops Respiratory: CTA bilaterally, no wheezing, no rhonchi Abdominal: Soft, NT, ND, bowel sounds + Extremities: no edema, no cyanosis    The results of significant diagnostics from this hospitalization (including imaging, microbiology, ancillary and laboratory) are listed below for reference.     Microbiology: Recent Results (from the past 240 hour(s))  Blood Culture (routine x 2)     Status: None (Preliminary result)   Collection Time: 02/18/18  9:58 AM  Result Value Ref Range Status   Specimen Description BLOOD BLOOD LEFT HAND  Final   Special Requests   Final    BOTTLES DRAWN AEROBIC AND ANAEROBIC Blood Culture results may not be optimal due to an excessive volume of blood received in culture bottles   Culture   Final    NO GROWTH 2 DAYS Performed at [facility] [address], [place], [place] [address]    Report  Status PENDING  Incomplete  Blood Culture (routine x 2)     Status: None (Preliminary result)   Collection Time: 02/18/18  9:59 AM  Result Value Ref Range Status   Specimen Description BLOOD SITE NOT SPECIFIED  Final   Special Requests   Final    BOTTLES DRAWN AEROBIC AND ANAEROBIC Blood Culture results may not be optimal due to an excessive volume of blood received in culture bottles   Culture   Final    NO GROWTH 2 DAYS Performed at [facility], [place] [address] [place], [place] [address]    Report Status PENDING  Incomplete  Culture, Urine     Status: Abnormal   Collection Time: 02/18/18  1:44 PM  Result Value Ref Range Status   Specimen Description URINE, RANDOM  Final   Special Requests   Final    NONE Performed at [facility], [place] [address] [place], [place] [address]    Culture 70,000 COLONIES/mL ESCHERICHIA COLI (A)  Final   Report Status 02/20/2018 FINAL  Final   Organism ID, Bacteria ESCHERICHIA COLI (A)  Final      Susceptibility   Escherichia coli - MIC*    AMPICILLIN 4 SENSITIVE Sensitive     CEFAZOLIN <=4 SENSITIVE Sensitive     CEFTRIAXONE <=1 SENSITIVE Sensitive     CIPROFLOXACIN <=0.25 SENSITIVE Sensitive     GENTAMICIN <=1 SENSITIVE Sensitive     IMIPENEM <=0.25 SENSITIVE Sensitive     NITROFURANTOIN <=16 SENSITIVE Sensitive     TRIMETH/SULFA <=20 SENSITIVE Sensitive     AMPICILLIN/SULBACTAM <=2 SENSITIVE Sensitive     PIP/TAZO <=4 SENSITIVE Sensitive     Extended ESBL NEGATIVE Sensitive     * 70,000 COLONIES/mL ESCHERICHIA COLI     Labs: BNP (last 3 results) Recent Labs    02/18/18 0951  BNP 46.5   Basic Metabolic Panel: Recent Labs  Lab 02/18/18 0958 02/19/18 0140 02/21/18 0416  NA 137 136 139  K 3.8 3.6 3.4*  CL 108 105 106  CO2 22 21* 25  GLUCOSE 219* 91 136*  BUN 16 12 8   CREATININE 0.66 0.52* 0.53*  CALCIUM 8.5* 8.4* 8.3*   Liver Function Tests: Recent Labs  Lab 02/18/18 0958  AST 19  ALT 15  ALKPHOS 80  BILITOT  1.1  PROT 6.2*  ALBUMIN 3.4*   No results for input(s): LIPASE, AMYLASE in the last 168 hours. No results for input(s): AMMONIA in the last 168 hours. CBC: Recent Labs  Lab 02/18/18 0958 02/19/18 0140 02/20/18 0254 02/21/18 0416  WBC 25.7* 27.2* 13.1* 10.1  NEUTROABS 20.0*  --   --   --   HGB 8.8* 8.7* 8.7* 8.0*  HCT 26.8* 26.4* 26.7* 24.0*  MCV 122.9* 125.7* 126.5* 123.7*  PLT 337 340 PLATELET CLUMPS NOTED ON SMEAR, UNABLE TO ESTIMATE 308   Cardiac Enzymes: No results for input(s): CKTOTAL, CKMB, CKMBINDEX, TROPONINI in the last 168 hours. BNP: Invalid input(s): POCBNP CBG: Recent Labs  Lab 02/20/18 0839 02/20/18 1212 02/20/18 1630 02/20/18 2218 02/21/18 0740  GLUCAP 178* 177* 244* 277* 99   D-Dimer No results for input(s): DDIMER in the last 72 hours. Hgb A1c Recent Labs    02/18/18 1829  HGBA1C 8.1*   Lipid Profile No results for input(s): CHOL, HDL, LDLCALC, TRIG, CHOLHDL, LDLDIRECT in the last 72 hours. Thyroid function studies Recent Labs    02/20/18 0254  TSH 0.991   Anemia work up No results for input(s): VITAMINB12, FOLATE, FERRITIN, TIBC, IRON, RETICCTPCT in the last 72 hours. Urinalysis    Component Value Date/Time   COLORURINE AMBER (A) 02/18/2018 1249   APPEARANCEUR CLOUDY (A) 02/18/2018 1249   LABSPEC 1.031 (H) 02/18/2018 1249   PHURINE 5.0 02/18/2018 1249   GLUCOSEU NEGATIVE 02/18/2018 1249   HGBUR NEGATIVE 02/18/2018 1249   BILIRUBINUR NEGATIVE 02/18/2018 1249   BILIRUBINUR negative 10/29/2017 1428   KETONESUR NEGATIVE 02/18/2018 1249   PROTEINUR 30 (A) 02/18/2018 1249   UROBILINOGEN 0.2 10/29/2017 1428   NITRITE POSITIVE (A) 02/18/2018 1249   LEUKOCYTESUR LARGE (A) 02/18/2018 1249   Sepsis Labs Invalid input(s): PROCALCITONIN,  WBC,  LACTICIDVEN Microbiology Recent Results (from the past 240 hour(s))  Blood Culture (routine x 2)     Status: None (Preliminary result)   Collection Time: 02/18/18  9:58 AM  Result Value Ref Range  Status   Specimen Description BLOOD BLOOD LEFT HAND  Final   Special Requests   Final    BOTTLES DRAWN AEROBIC AND ANAEROBIC Blood Culture results may not be optimal due to an excessive volume of blood received in culture bottles   Culture   Final    NO GROWTH 2 DAYS Performed at [facility], [place] [address] [place], [place] [address]    Report Status PENDING  Incomplete  Blood Culture (routine x 2)     Status: None (Preliminary result)   Collection Time: 02/18/18  9:59 AM  Result Value Ref Range Status   Specimen Description BLOOD SITE NOT SPECIFIED  Final   Special Requests   Final    BOTTLES DRAWN AEROBIC AND ANAEROBIC Blood Culture results may not be optimal due to an excessive volume of blood received in culture bottles   Culture   Final    NO GROWTH 2 DAYS Performed at [facility], [place] [address] [place], [place] [address]    Report Status PENDING  Incomplete  Culture, Urine     Status: Abnormal   Collection Time: 02/18/18  1:44 PM  Result Value Ref Range Status   Specimen Description URINE, RANDOM  Final   Special Requests   Final    NONE Performed at [facility], [place] [address] [place], [place] [address]    Culture  70,000 COLONIES/mL ESCHERICHIA COLI (A)  Final   Report Status 02/20/2018 FINAL  Final   Organism ID, Bacteria ESCHERICHIA COLI (A)  Final      Susceptibility   Escherichia coli - MIC*    AMPICILLIN 4 SENSITIVE Sensitive     CEFAZOLIN <=4 SENSITIVE Sensitive     CEFTRIAXONE <=1 SENSITIVE Sensitive     CIPROFLOXACIN <=0.25 SENSITIVE Sensitive     GENTAMICIN <=1 SENSITIVE Sensitive     IMIPENEM <=0.25 SENSITIVE Sensitive     NITROFURANTOIN <=16 SENSITIVE Sensitive     TRIMETH/SULFA <=20 SENSITIVE Sensitive     AMPICILLIN/SULBACTAM <=2 SENSITIVE Sensitive     PIP/TAZO <=4 SENSITIVE Sensitive     Extended ESBL NEGATIVE Sensitive     * 70,000 COLONIES/mL ESCHERICHIA COLI     Time coordinating discharge: 45 minutes        SIGNED:   Edwin Dada, MD  [facility] 02/21/2018, 4:35 PM

## 2018-02-21 NOTE — Progress Notes (Signed)
Pt DC to home with instructions. Vitals stable at DC. Pt taken to main entrance with wheelchair. Belongings returned  [place]

## 2018-02-21 NOTE — Discharge Instructions (Addendum)

## 2018-02-23 LAB — CULTURE, BLOOD (ROUTINE X 2)
Culture: NO GROWTH
Culture: NO GROWTH

## 2018-02-27 DIAGNOSIS — R351 Nocturia: Secondary | ICD-10-CM | POA: Diagnosis not present

## 2018-02-27 DIAGNOSIS — R350 Frequency of micturition: Secondary | ICD-10-CM | POA: Diagnosis not present

## 2018-03-26 DIAGNOSIS — E118 Type 2 diabetes mellitus with unspecified complications: Secondary | ICD-10-CM | POA: Diagnosis not present

## 2018-03-26 DIAGNOSIS — D472 Monoclonal gammopathy: Secondary | ICD-10-CM | POA: Diagnosis not present

## 2018-03-26 DIAGNOSIS — Z91038 Other insect allergy status: Secondary | ICD-10-CM | POA: Diagnosis not present

## 2018-03-26 DIAGNOSIS — R69 Illness, unspecified: Secondary | ICD-10-CM | POA: Diagnosis not present

## 2018-03-26 DIAGNOSIS — T63441A Toxic effect of venom of bees, accidental (unintentional), initial encounter: Secondary | ICD-10-CM | POA: Diagnosis not present

## 2018-04-02 DIAGNOSIS — K529 Noninfective gastroenteritis and colitis, unspecified: Secondary | ICD-10-CM | POA: Diagnosis not present

## 2018-04-02 DIAGNOSIS — E119 Type 2 diabetes mellitus without complications: Secondary | ICD-10-CM | POA: Diagnosis not present

## 2018-04-02 DIAGNOSIS — Z794 Long term (current) use of insulin: Secondary | ICD-10-CM | POA: Diagnosis not present

## 2018-04-05 ENCOUNTER — Ambulatory Visit: Payer: Medicare HMO | Admitting: Cardiovascular Disease

## 2018-04-05 ENCOUNTER — Encounter: Payer: Self-pay | Admitting: Cardiovascular Disease

## 2018-04-05 VITALS — BP 120/60 | HR 59 | Ht 69.0 in | Wt 172.4 lb

## 2018-04-05 DIAGNOSIS — I480 Paroxysmal atrial fibrillation: Secondary | ICD-10-CM

## 2018-04-05 DIAGNOSIS — I700 Atherosclerosis of aorta: Secondary | ICD-10-CM

## 2018-04-05 DIAGNOSIS — D649 Anemia, unspecified: Secondary | ICD-10-CM | POA: Diagnosis not present

## 2018-04-05 DIAGNOSIS — E119 Type 2 diabetes mellitus without complications: Secondary | ICD-10-CM | POA: Diagnosis not present

## 2018-04-05 DIAGNOSIS — Z794 Long term (current) use of insulin: Secondary | ICD-10-CM | POA: Diagnosis not present

## 2018-04-05 NOTE — Progress Notes (Signed)
Cardiology Office Note:    Date:  04/08/2018   ID:  Tommy Peterson, DOB 09/13/1940, MRN [id number]  PCP:  Jani Gravel, MD  Cardiologist:  No primary care provider on file.   Referring MD: Edwin Dada,*   Chief Complaint  Patient presents with  . New Patient (Initial Visit)    doctor told patieint he had Afib    History of Present Illness:    Tommy Peterson is a 77 y.o. 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  Anticoagulation was recommended and he was prescribed Eliquis but he has not taken it yet.  He is very skeptical about its benefits and is concerned that his episode of atrial fibrillation was a one-time event related to his acute illness.  He was never aware of palpitations.  This is his first appointment with cardiology, but I have met Tommy Peterson before when he accompanied Juliette Alcide to our office on several occasions.  The patient specifically denies any chest pain at rest exertion, dyspnea at rest or with exertion, orthopnea, paroxysmal nocturnal dyspnea, syncope, palpitations, focal neurological deficits, intermittent claudication, lower extremity edema, unexplained weight gain, cough, hemoptysis or wheezing.  CHA2DS2-VASc Score =4(age 54, HTN -questionable dx, DM), 4.8% stroke risk per year.HASBLED 1.  He does not have a history of coronary artery disease or peripheral arterial disease.  In 2017 he was admitted with an episode of chest pain was negative.  There was evidence of aortic and coronary calcifications on a CT of the chest performed in January 2018.  Past Medical History:  Diagnosis Date  . Acute respiratory failure ([place])   . BPH (benign prostatic hyperplasia)   .  Carpal tunnel syndrome on both sides   . Chronic diarrhea    intermittant due to chronic collagenous colitis  . Chronic large granular lymphocytic leukemia ([place]) primary hemotologist-  [name]. 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 Collagenous colitis    chronic--- intermittant between diarrahea and constipation  . ED (erectile dysfunction)   . HCAP (healthcare-associated pneumonia)   . History of adenomatous polyp of colon   . History of Clostridium difficile colitis 05/2016  . History of pneumonia 11/08/2017   CAP -- [facility]---  12-19-2017  per pt no residual symptoms  . History of SCC (squamous cell carcinoma) of skin   . History of sepsis    11-20-2017 severe sepsis due to UTI;  10/ 2017sepsis due to c-diff colitis  . Leucocytosis    chronic  . Lower urinary tract symptoms (LUTS)   . Macrocytic anemia    since 02/ 2015  . MGUS (monoclonal gammopathy of unknown significance)    hemotologist-  [name] @ [facility]  . Monoclonal paraproteinemia   . Neuropathy, peripheral   . OA (osteoarthritis)   . Prostatic stone   . Rash of face    RIGHT SIDE  . Raynaud's syndrome   . Thin skin    fragile  . Transfusion-dependent anemia since 10/ 2017   last transfusion PRBCs 07-19-2017  per hemologist note dated 10-25-2017  . Type 2 diabetes mellitus  treated with insulin ([place])    followed by [name] (pcp)  . Urinary hesitancy   . Wears contact lenses    LEFT EYE ONLY    Past Surgical History:  Procedure Laterality Date  . BONE MARROW BIOPSY Right 05-24-2016;  08-20-2014  . CARDIOVASCULAR STRESS TEST  11-26-2012   [name]  @ [facility] ([place]   normal nuclear study w/ no ischemia/  normal LV function and wall motion , ef 60% (find in care everywhere,epic)    . CATARACT EXTRACTION W/ INTRAOCULAR LENS  IMPLANT, BILATERAL  2015  . CYSTOSCOPY WITH LITHOLAPAXY N/A 12/25/2017   Procedure: CYSTOSCOPY WITH LITHOLAPAXY AND FULGERATION;  Surgeon: [name], MD;  Location[facility];  Service: Urology;  Laterality: N/A;  . INGUINAL HERNIA REPAIR Right 1980s  . PENILE PROSTHESIS IMPLANT  12-02-2015   dr [name] @ [facility] in [place]  . SHOULDER SURGERY Left 08/2016  . TONSILLECTOMY  child  . TRANSURETHRAL RESECTION OF PROSTATE  2009   AND REPAIR RECURRENT RIGHT INGUINAL HERNIA    Current Medications: Current Meds  Medication Sig  . acetaminophen (TYLENOL) 325 MG tablet Take 2 tablets (650 mg total) by mouth every 6 (six) hours as needed for mild pain (or Fever >/= 101).  . Ascorbic Acid (VITAMIN C) 1000 MG tablet Take 1,000 mg by mouth daily.  . budesonide (ENTOCORT EC) 3 MG 24 hr capsule Take 3 mg by mouth every other day.   . Insulin Glargine-Lixisenatide (SOLIQUA) 100-33 UNT-MCG/ML SOPN Inject 28 Units into the skin at bedtime.   . methotrexate (RHEUMATREX) 2.5 MG tablet Take 30 mg by mouth every Tuesday.   . metoprolol tartrate (LOPRESSOR) 50 MG tablet Take 1 tablet (50 mg total) by mouth 2 (two) times daily.  . pioglitazone (ACTOS) 15 MG tablet Take 15 mg by mouth every morning.   . traMADol (ULTRAM) 50 MG tablet Take 1 tablet (50 mg total) by mouth every 6 (six) hours as needed. (Patient not taking: Reported on 04/05/2018)  . vitamin B-12 (CYANOCOBALAMIN) 500 MCG tablet Take 1 tablet (500 mcg total) by mouth daily.     Allergies:   Bee venom; Metformin and related; and Sulfamethoxazole-trimethoprim   Social History   Socioeconomic History  . Marital status: Widowed    Spouse name: engaged  . Number of children: 8  . Years of education: college-2  . Highest education level: Not on file  Occupational History  . Occupation: Press photographer  . Occupation: [employer]  Social Needs  . Financial resource  strain: Not on file  . Food insecurity:    Worry: Not on file    Inability: Not on file  . Transportation needs:    Medical: Not on file    Non-medical: Not on file  Tobacco Use  . Smoking status: Former Smoker    Years: 20.00    Types: Cigarettes    Last attempt to quit: 05/04/1996    Years since quitting: 21.9  . Smokeless tobacco: Never Used  Substance and Sexual Activity  . Alcohol use: Yes    Comment: 1-2 glasses of wine every other day  . Drug use: No  . Sexual activity: Never  Lifestyle  . Physical activity:    Days per week: Not on file    Minutes per session: Not on file  . Stress: Not on file  Relationships  . Social connections:    Talks on phone: Not on file  Gets together: Not on file    Attends religious service: Not on file    Active member of club or organization: Not on file    Attends meetings of clubs or organizations: Not on file    Relationship status: Not on file  Other Topics Concern  . Not on file  Social History Narrative  . Not on file     Family History: The patient's family history includes Diabetes in his mother; Heart failure in his mother.  ROS:   Please see the history of present illness.     All other systems reviewed and are negative.  EKGs/Labs/Other Studies Reviewed:    The following studies were reviewed today: ECHO 02/20/2018  - Left ventricle: The cavity size was normal. Wall thickness was   increased in a pattern of moderate LVH. Systolic function was   normal. The estimated ejection fraction was in the range of 60%   to 65%. Wall motion was normal; there were no regional wall   motion abnormalities. - Aortic valve: Transvalvular velocity was within the normal range.   There was no stenosis. There was no regurgitation. - Mitral valve: Transvalvular velocity was within the normal range.   There was no evidence for stenosis. There was trivial   regurgitation. - Left atrium: The atrium was moderately dilated. - Right  ventricle: The cavity size was normal. Wall thickness was   normal. Systolic function was normal. - Right atrium: The atrium was moderately dilated. - Atrial septum: No defect or patent foramen ovale was identified   by color flow Doppler. - Tricuspid valve: There was mild regurgitation. - Pulmonary arteries: Systolic pressure was mildly increased. PA   peak pressure: 44 mm Hg (S).   EKG:  EKG is not ordered today.  The ekg ordered 02/19/2018 demonstrates AFib with RVR, LAFB. Admission ECG on 02/18/2018 - NSR with PACs  Recent Labs: 11/22/2017: Magnesium 1.9 02/18/2018: ALT 15; B Natriuretic Peptide 47.8 02/20/2018: TSH 0.991 02/21/2018: BUN 8; Creatinine, Ser 0.53; Hemoglobin 8.0; Platelets 308; Potassium 3.4; Sodium 139  Recent Lipid Panel    Component Value Date/Time   CHOL 111 05/05/2016 0402   TRIG 106 05/05/2016 0402   HDL 27 (L) 05/05/2016 0402   CHOLHDL 4.1 05/05/2016 0402   VLDL 21 05/05/2016 0402   LDLCALC 63 05/05/2016 0402    Physical Exam:    VS:  BP 120/60 (BP Location: Left Arm, Patient Position: Sitting)   Pulse (!) 59   Ht _0  (1.753 m)   Wt 172 lb 6.4 oz (78.2 kg)   SpO2 99%   BMI 25.46 kg/m     Wt Readings from Last 3 Encounters:  04/05/18 172 lb 6.4 oz (78.2 kg)  02/18/18 163 lb 7.2 oz (74.1 kg)  12/25/17 165 lb 8 oz (75.1 kg)     GEN:  Well nourished, well developed in no acute distress HEENT: Normal NECK: No JVD; No carotid bruits LYMPHATICS: No lymphadenopathy CARDIAC: RRR, no murmurs, rubs, gallops RESPIRATORY:  Clear to auscultation without rales, wheezing or rhonchi  ABDOMEN: Soft, non-tender, non-distended MUSCULOSKELETAL:  No edema; No deformity  SKIN: Warm and dry NEUROLOGIC:  Alert and oriented x 3 PSYCHIATRIC:  Normal affect   ASSESSMENT:    1. Paroxysmal atrial fibrillation (HCC)   2. Chronic anemia   3. Type 2 diabetes mellitus without complication, with long-term current use of insulin ([place])   4. Aortic atherosclerosis  ([place])    PLAN:    In order of problems  listed above:  1. AFib: This could indeed have been a one-time event related to pneumonia, but it statistically more likely that he has episodes of asymptomatic paroxysmal atrial fibrillation.  This would have a big implication as a regarding his risk for stroke.  He is very skeptical about anticoagulants.  To clarify his situation, I recommended implantation of a loop recorder. This procedure has been fully reviewed with the patient and written informed consent has been obtained. 2. Leukemia/chronic anemia: He does not have thrombocytopenia and is not therefore at high risk for bleeding, but hemorrhage would have more serious implications in view of his chronic moderate to severe anemia.  Therefore a firm indication for anticoagulants is even more important. 3. DM: No known history of coronary or peripheral vascular disease.  He is on Actos without evidence of fluid overload/heart failure. 4. Aortic and coronary atherosclerosis: Noted on CT chest.  Asymptomatic.  LDL cholesterol was 63 in 2017   Medication Adjustments/Labs and Tests Ordered: Current medicines are reviewed at length with the patient today.  Concerns regarding medicines are outlined above.  No orders of the defined types were placed in this encounter.  No orders of the defined types were placed in this encounter.   Patient Instructions   [facility] [place]  [place], [place] [address]  Phone: [phone] Fax: [phone]   You are scheduled for a Loop Recorder Implant on  Wednesday, September 4th 2019 with Dr. Sallyanne Kuster.   Please arrive at the [facility] at  12:30 pm on the day of your procedure.    You do not need to be fasting.   You do not need pre-procedure lab work or testing.   Please bring your insurance cards and a list of your medications with you.     Wash your chest and neck with the surgical soap provided the evening before and the morning of your procedure. Please following the washing instructions provided.   There aren't any restrictions after the procedure. You will receive wound care instructions upon discharge.  If you have any questions after you get home, please call the office at [phone].   Thank you, [name], MD   * Special note:  Every effort is made to have your procedure done on time.  Occasionally there are emergencies that present themselves at the hospital that may cause delays.  Please be patient if a delay does occur.  Preparing for Surgery  Before surgery, you can play an important role. Because skin is not sterile, your skin needs to be as free of germs as possible. You can reduce the number of germs on your skin by washing with CHG (chlorhexidine gluconate) Soap before surgery. CHG is an antiseptic cleaner which kills germs and bonds with the skin to continue killing germs even after washing.  Please do not use if you have an allergy to CHG or antibacterial soaps. If your skin becomes reddened/irritated, STOP using the CHG.  DO NOT SHAVE (including legs and underarms) for at least 48 hours prior to first CHG shower. It is OK to shave your face.  Please follow these instructions carefully: 1. Shower the night before surgery and the morning of surgery with CHG Soap. 2. If you chose to wash your hair, wash your hair first as usual with your normal shampoo/conditioner. 3. After you shampoo/condition, rinse you hair and body thoroughly to  remove shampoo/conditioner. 4. Use CHG as you would any other liquid soap. You can apply CHG directly to the skin and wash gently with a loofah or a clean washcloth. 5. Apply the CHG Soap to your body ONLY FROM THE NECK DOWN. Do not use on open wounds or open sores. Avoid contact with your eyes, ears, mouth, and genitals (private parts). Wash genitals (private part) with  your normal soap. 6. Wash thoroughly, paying special attention to the area where your surgery will be performed. 7. Thoroughly rinse your body with warm water from the neck down. 8. DO NOT shower/wash with your normal soap after using and rinsing off the CHG Soap. 9. Pat yourself dry with a clean towel. 10. Wear clean pajamas to bed. 11. Place clean sheets on your bed the night of your first shower and do not sleep with pets..  Day of Surgery: Shower with the CHG Soap following the instructions listed above. DO NOT apply deodorants or lotions. Please wear clean clothes to the hospital/surgery center.     Signed, [name], MD  04/08/2018 9:11 AM    Tommy Peterson

## 2018-04-05 NOTE — H&P (View-Only) (Signed)
Cardiology Office Note:    Date:  04/08/2018   ID:  Tommy Peterson, DOB 09/13/1940, MRN [id number]  PCP:  Jani Gravel, MD  Cardiologist:  No primary care provider on file.   Referring MD: Edwin Dada,*   Chief Complaint  Patient presents with  . New Patient (Initial Visit)    doctor told patieint he had Afib    History of Present Illness:    Tommy Peterson is a 77 y.o. 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  Anticoagulation was recommended and he was prescribed Eliquis but he has not taken it yet.  He is very skeptical about its benefits and is concerned that his episode of atrial fibrillation was a one-time event related to his acute illness.  He was never aware of palpitations.  This is his first appointment with cardiology, but I have met Tommy Peterson before when he accompanied Juliette Alcide to our office on several occasions.  The patient specifically denies any chest pain at rest exertion, dyspnea at rest or with exertion, orthopnea, paroxysmal nocturnal dyspnea, syncope, palpitations, focal neurological deficits, intermittent claudication, lower extremity edema, unexplained weight gain, cough, hemoptysis or wheezing.  CHA2DS2-VASc Score =4(age 54, HTN -questionable dx, DM), 4.8% stroke risk per year.HASBLED 1.  He does not have a history of coronary artery disease or peripheral arterial disease.  In 2017 he was admitted with an episode of chest pain was negative.  There was evidence of aortic and coronary calcifications on a CT of the chest performed in January 2018.  Past Medical History:  Diagnosis Date  . Acute respiratory failure ([place])   . BPH (benign prostatic hyperplasia)   .  Carpal tunnel syndrome on both sides   . Chronic diarrhea    intermittant due to chronic collagenous colitis  . Chronic large granular lymphocytic leukemia ([place]) primary hemotologist-  [name]. 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 Collagenous colitis    chronic--- intermittant between diarrahea and constipation  . ED (erectile dysfunction)   . HCAP (healthcare-associated pneumonia)   . History of adenomatous polyp of colon   . History of Clostridium difficile colitis 05/2016  . History of pneumonia 11/08/2017   CAP -- [facility]---  12-19-2017  per pt no residual symptoms  . History of SCC (squamous cell carcinoma) of skin   . History of sepsis    11-20-2017 severe sepsis due to UTI;  10/ 2017sepsis due to c-diff colitis  . Leucocytosis    chronic  . Lower urinary tract symptoms (LUTS)   . Macrocytic anemia    since 02/ 2015  . MGUS (monoclonal gammopathy of unknown significance)    hemotologist-  [name] @ [facility]  . Monoclonal paraproteinemia   . Neuropathy, peripheral   . OA (osteoarthritis)   . Prostatic stone   . Rash of face    RIGHT SIDE  . Raynaud's syndrome   . Thin skin    fragile  . Transfusion-dependent anemia since 10/ 2017   last transfusion PRBCs 07-19-2017  per hemologist note dated 10-25-2017  . Type 2 diabetes mellitus  treated with insulin ([place])    followed by [name] (pcp)  . Urinary hesitancy   . Wears contact lenses    LEFT EYE ONLY    Past Surgical History:  Procedure Laterality Date  . BONE MARROW BIOPSY Right 05-24-2016;  08-20-2014  . CARDIOVASCULAR STRESS TEST  11-26-2012   [name]  @ [facility] ([place]   normal nuclear study w/ no ischemia/  normal LV function and wall motion , ef 60% (find in care everywhere,epic)    . CATARACT EXTRACTION W/ INTRAOCULAR LENS  IMPLANT, BILATERAL  2015  . CYSTOSCOPY WITH LITHOLAPAXY N/A 12/25/2017   Procedure: CYSTOSCOPY WITH LITHOLAPAXY AND FULGERATION;  Surgeon: [name], MD;  [facility];  Service: Urology;  Laterality: N/A;  . INGUINAL HERNIA REPAIR Right 1980s  . PENILE PROSTHESIS IMPLANT  12-02-2015   dr [name] @ [facility] in [place]  . SHOULDER SURGERY Left 08/2016  . TONSILLECTOMY  child  . TRANSURETHRAL RESECTION OF PROSTATE  2009   AND REPAIR RECURRENT RIGHT INGUINAL HERNIA    Current Medications: Current Meds  Medication Sig  . acetaminophen (TYLENOL) 325 MG tablet Take 2 tablets (650 mg total) by mouth every 6 (six) hours as needed for mild pain (or Fever >/= 101).  . Ascorbic Acid (VITAMIN C) 1000 MG tablet Take 1,000 mg by mouth daily.  . budesonide (ENTOCORT EC) 3 MG 24 hr capsule Take 3 mg by mouth every other day.   . Insulin Glargine-Lixisenatide (SOLIQUA) 100-33 UNT-MCG/ML SOPN Inject 28 Units into the skin at bedtime.   . methotrexate (RHEUMATREX) 2.5 MG tablet Take 30 mg by mouth every Tuesday.   . metoprolol tartrate (LOPRESSOR) 50 MG tablet Take 1 tablet (50 mg total) by mouth 2 (two) times daily.  . pioglitazone (ACTOS) 15 MG tablet Take 15 mg by mouth every morning.   . traMADol (ULTRAM) 50 MG tablet Take 1 tablet (50 mg total) by mouth every 6 (six) hours as needed. (Patient not taking: Reported on 04/05/2018)  . vitamin B-12 (CYANOCOBALAMIN) 500 MCG tablet Take 1 tablet (500 mcg total) by mouth daily.     Allergies:   Bee venom; Metformin and related; and Sulfamethoxazole-trimethoprim   Social History   Socioeconomic History  . Marital status: Widowed    Spouse name: engaged  . Number of children: 8  . Years of education: college-2  . Highest education level: Not on file  Occupational History  . Occupation: Press photographer  . Occupation: [employer]  Social Needs  . Financial resource  strain: Not on file  . Food insecurity:    Worry: Not on file    Inability: Not on file  . Transportation needs:    Medical: Not on file    Non-medical: Not on file  Tobacco Use  . Smoking status: Former Smoker    Years: 20.00    Types: Cigarettes    Last attempt to quit: 05/04/1996    Years since quitting: 21.9  . Smokeless tobacco: Never Used  Substance and Sexual Activity  . Alcohol use: Yes    Comment: 1-2 glasses of wine every other day  . Drug use: No  . Sexual activity: Never  Lifestyle  . Physical activity:    Days per week: Not on file    Minutes per session: Not on file  . Stress: Not on file  Relationships  . Social connections:    Talks on phone: Not on file  Gets together: Not on file    Attends religious service: Not on file    Active member of club or organization: Not on file    Attends meetings of clubs or organizations: Not on file    Relationship status: Not on file  Other Topics Concern  . Not on file  Social History Narrative  . Not on file     Family History: The patient's family history includes Diabetes in his mother; Heart failure in his mother.  ROS:   Please see the history of present illness.     All other systems reviewed and are negative.  EKGs/Labs/Other Studies Reviewed:    The following studies were reviewed today: ECHO 02/20/2018  - Left ventricle: The cavity size was normal. Wall thickness was   increased in a pattern of moderate LVH. Systolic function was   normal. The estimated ejection fraction was in the range of 60%   to 65%. Wall motion was normal; there were no regional wall   motion abnormalities. - Aortic valve: Transvalvular velocity was within the normal range.   There was no stenosis. There was no regurgitation. - Mitral valve: Transvalvular velocity was within the normal range.   There was no evidence for stenosis. There was trivial   regurgitation. - Left atrium: The atrium was moderately dilated. - Right  ventricle: The cavity size was normal. Wall thickness was   normal. Systolic function was normal. - Right atrium: The atrium was moderately dilated. - Atrial septum: No defect or patent foramen ovale was identified   by color flow Doppler. - Tricuspid valve: There was mild regurgitation. - Pulmonary arteries: Systolic pressure was mildly increased. PA   peak pressure: 44 mm Hg (S).   EKG:  EKG is not ordered today.  The ekg ordered 02/19/2018 demonstrates AFib with RVR, LAFB. Admission ECG on 02/18/2018 - NSR with PACs  Recent Labs: 11/22/2017: Magnesium 1.9 02/18/2018: ALT 15; B Natriuretic Peptide 47.8 02/20/2018: TSH 0.991 02/21/2018: BUN 8; Creatinine, Ser 0.53; Hemoglobin 8.0; Platelets 308; Potassium 3.4; Sodium 139  Recent Lipid Panel    Component Value Date/Time   CHOL 111 05/05/2016 0402   TRIG 106 05/05/2016 0402   HDL 27 (L) 05/05/2016 0402   CHOLHDL 4.1 05/05/2016 0402   VLDL 21 05/05/2016 0402   LDLCALC 63 05/05/2016 0402    Physical Exam:    VS:  BP 120/60 (BP Location: Left Arm, Patient Position: Sitting)   Pulse (!) 59   Ht _0  (1.753 m)   Wt 172 lb 6.4 oz (78.2 kg)   SpO2 99%   BMI 25.46 kg/m     Wt Readings from Last 3 Encounters:  04/05/18 172 lb 6.4 oz (78.2 kg)  02/18/18 163 lb 7.2 oz (74.1 kg)  12/25/17 165 lb 8 oz (75.1 kg)     GEN:  Well nourished, well developed in no acute distress HEENT: Normal NECK: No JVD; No carotid bruits LYMPHATICS: No lymphadenopathy CARDIAC: RRR, no murmurs, rubs, gallops RESPIRATORY:  Clear to auscultation without rales, wheezing or rhonchi  ABDOMEN: Soft, non-tender, non-distended MUSCULOSKELETAL:  No edema; No deformity  SKIN: Warm and dry NEUROLOGIC:  Alert and oriented x 3 PSYCHIATRIC:  Normal affect   ASSESSMENT:    1. Paroxysmal atrial fibrillation (HCC)   2. Chronic anemia   3. Type 2 diabetes mellitus without complication, with long-term current use of insulin ([place])   4. Aortic atherosclerosis  ([place])    PLAN:    In order of problems  listed above:  1. AFib: This could indeed have been a one-time event related to pneumonia, but it statistically more likely that he has episodes of asymptomatic paroxysmal atrial fibrillation.  This would have a big implication as a regarding his risk for stroke.  He is very skeptical about anticoagulants.  To clarify his situation, I recommended implantation of a loop recorder. This procedure has been fully reviewed with the patient and written informed consent has been obtained. 2. Leukemia/chronic anemia: He does not have thrombocytopenia and is not therefore at high risk for bleeding, but hemorrhage would have more serious implications in view of his chronic moderate to severe anemia.  Therefore a firm indication for anticoagulants is even more important. 3. DM: No known history of coronary or peripheral vascular disease.  He is on Actos without evidence of fluid overload/heart failure. 4. Aortic and coronary atherosclerosis: Noted on CT chest.  Asymptomatic.  LDL cholesterol was 63 in 2017   Medication Adjustments/Labs and Tests Ordered: Current medicines are reviewed at length with the patient today.  Concerns regarding medicines are outlined above.  No orders of the defined types were placed in this encounter.  No orders of the defined types were placed in this encounter.   Patient Instructions   [facility], [place]  [place], [place] [address]  Phone: [phone] Fax: [phone]   You are scheduled for a Loop Recorder Implant on  Wednesday, September 4th 2019 with Dr. Sallyanne Kuster.   Please arrive at the [facility] at  12:30 pm on the day of your procedure.    You do not need to be fasting.   You do not need pre-procedure lab work or testing.   Please bring your insurance cards and a list of your medications with you.     Wash your chest and neck with the surgical soap provided the evening before and the morning of your procedure. Please following the washing instructions provided.   There aren't any restrictions after the procedure. You will receive wound care instructions upon discharge.  If you have any questions after you get home, please call the office at [phone].   Thank you, [name], MD   * Special note:  Every effort is made to have your procedure done on time.  Occasionally there are emergencies that present themselves at the hospital that may cause delays.  Please be patient if a delay does occur.  Preparing for Surgery  Before surgery, you can play an important role. Because skin is not sterile, your skin needs to be as free of germs as possible. You can reduce the number of germs on your skin by washing with CHG (chlorhexidine gluconate) Soap before surgery. CHG is an antiseptic cleaner which kills germs and bonds with the skin to continue killing germs even after washing.  Please do not use if you have an allergy to CHG or antibacterial soaps. If your skin becomes reddened/irritated, STOP using the CHG.  DO NOT SHAVE (including legs and underarms) for at least 48 hours prior to first CHG shower. It is OK to shave your face.  Please follow these instructions carefully: 1. Shower the night before surgery and the morning of surgery with CHG Soap. 2. If you chose to wash your hair, wash your hair first as usual with your normal shampoo/conditioner. 3. After you shampoo/condition, rinse you hair and body thoroughly to  remove shampoo/conditioner. 4. Use CHG as you would any other liquid soap. You can apply CHG directly to the skin and wash gently with a loofah or a clean washcloth. 5. Apply the CHG Soap to your body ONLY FROM THE NECK DOWN. Do not use on open wounds or open sores. Avoid contact with your eyes, ears, mouth, and genitals (private parts). Wash genitals (private part) with  your normal soap. 6. Wash thoroughly, paying special attention to the area where your surgery will be performed. 7. Thoroughly rinse your body with warm water from the neck down. 8. DO NOT shower/wash with your normal soap after using and rinsing off the CHG Soap. 9. Pat yourself dry with a clean towel. 10. Wear clean pajamas to bed. 11. Place clean sheets on your bed the night of your first shower and do not sleep with pets..  Day of Surgery: Shower with the CHG Soap following the instructions listed above. DO NOT apply deodorants or lotions. Please wear clean clothes to the hospital/surgery center.     Signed, [name], MD  04/08/2018 9:11 AM    Tommy Peterson

## 2018-04-05 NOTE — Patient Instructions (Signed)
  [facility] at [facility], [address]  [place], [place] [address]  Phone: [phone] Fax: [phone]   You are scheduled for a Loop Recorder Implant on Wednesday, September 4th 2019 with Dr. Sallyanne Kuster.   Please arrive at the [facility] ([place]) at 12:30 pm on the day of your procedure.    You do not need to be fasting.   You do not need pre-procedure lab work or testing.   Please bring your insurance cards and a list of your medications with you.   Wash your chest and neck with the surgical soap provided the evening before and the morning of your procedure. Please following the washing instructions provided.   There aren't any restrictions after the procedure. You will receive wound care instructions upon discharge.  If you have any questions after you get home, please call the office at [phone].   Thank you, [name], MD   * Special note:  Every effort is made to have your procedure done on time.  Occasionally there are emergencies that present themselves at the hospital that may cause delays.  Please be patient if a delay does occur.  Preparing for Surgery  Before surgery, you can play an important role. Because skin is not sterile, your skin needs to be as free of germs as possible. You can reduce the number of germs on your skin by washing with CHG (chlorhexidine gluconate) Soap before surgery. CHG is an antiseptic cleaner which kills germs and bonds with the skin to continue killing germs even after washing.  Please do not use if you have an allergy to CHG or antibacterial soaps. If your skin becomes reddened/irritated, STOP using the CHG.  DO NOT SHAVE (including legs and underarms) for at least 48 hours prior to first CHG shower. It is OK to shave your face.  Please follow these instructions carefully: 1. Shower the night before surgery and the morning of surgery  with CHG Soap. 2. If you chose to wash your hair, wash your hair first as usual with your normal shampoo/conditioner. 3. After you shampoo/condition, rinse you hair and body thoroughly to remove shampoo/conditioner. 4. Use CHG as you would any other liquid soap. You can apply CHG directly to the skin and wash gently with a loofah or a clean washcloth. 5. Apply the CHG Soap to your body ONLY FROM THE NECK DOWN. Do not use on open wounds or open sores. Avoid contact with your eyes, ears, mouth, and genitals (private parts). Wash genitals (private part) with your normal soap. 6. Wash thoroughly, paying special attention to the area where your surgery will be performed. 7. Thoroughly rinse your body with warm water from the neck down. 8. DO NOT shower/wash with your normal soap after using and rinsing off the CHG Soap. 9. Pat yourself dry with a clean towel. 10. Wear clean pajamas to bed. 11. Place clean sheets on your bed the night of your first shower and do not sleep with pets..  Day of Surgery: Shower with the CHG Soap following the instructions listed above. DO NOT apply deodorants or lotions. Please wear clean clothes to the hospital/surgery center.

## 2018-04-08 DIAGNOSIS — I700 Atherosclerosis of aorta: Secondary | ICD-10-CM | POA: Insufficient documentation

## 2018-04-09 DIAGNOSIS — Z794 Long term (current) use of insulin: Secondary | ICD-10-CM | POA: Diagnosis not present

## 2018-04-09 DIAGNOSIS — D472 Monoclonal gammopathy: Secondary | ICD-10-CM | POA: Diagnosis not present

## 2018-04-09 DIAGNOSIS — E785 Hyperlipidemia, unspecified: Secondary | ICD-10-CM | POA: Diagnosis not present

## 2018-04-09 DIAGNOSIS — E119 Type 2 diabetes mellitus without complications: Secondary | ICD-10-CM | POA: Diagnosis not present

## 2018-04-09 DIAGNOSIS — I10 Essential (primary) hypertension: Secondary | ICD-10-CM | POA: Diagnosis not present

## 2018-04-09 DIAGNOSIS — C9110 Chronic lymphocytic leukemia of B-cell type not having achieved remission: Secondary | ICD-10-CM | POA: Diagnosis not present

## 2018-04-09 DIAGNOSIS — R531 Weakness: Secondary | ICD-10-CM | POA: Diagnosis not present

## 2018-04-09 DIAGNOSIS — Z87891 Personal history of nicotine dependence: Secondary | ICD-10-CM | POA: Diagnosis not present

## 2018-04-09 DIAGNOSIS — D539 Nutritional anemia, unspecified: Secondary | ICD-10-CM | POA: Diagnosis not present

## 2018-04-10 ENCOUNTER — Other Ambulatory Visit: Payer: Self-pay

## 2018-04-10 ENCOUNTER — Ambulatory Visit (HOSPITAL_COMMUNITY)
Admission: RE | Admit: 2018-04-10 | Discharge: 2018-04-10 | Disposition: A | Discharge status: Home / Self Care | Payer: Medicare HMO | Source: Ambulatory Visit | Attending: Cardiovascular Disease | Admitting: Cardiovascular Disease

## 2018-04-10 ENCOUNTER — Encounter (HOSPITAL_COMMUNITY)
Admission: RE | Disposition: A | Discharge status: Home / Self Care | Payer: Self-pay | Source: Ambulatory Visit | Attending: Cardiovascular Disease

## 2018-04-10 DIAGNOSIS — Z888 Allergy status to other drugs, medicaments and biological substances status: Secondary | ICD-10-CM | POA: Diagnosis not present

## 2018-04-10 DIAGNOSIS — N400 Enlarged prostate without lower urinary tract symptoms: Secondary | ICD-10-CM | POA: Diagnosis not present

## 2018-04-10 DIAGNOSIS — I480 Paroxysmal atrial fibrillation: Secondary | ICD-10-CM | POA: Insufficient documentation

## 2018-04-10 DIAGNOSIS — N529 Male erectile dysfunction, unspecified: Secondary | ICD-10-CM | POA: Diagnosis not present

## 2018-04-10 DIAGNOSIS — Z856 Personal history of leukemia: Secondary | ICD-10-CM | POA: Insufficient documentation

## 2018-04-10 DIAGNOSIS — Z9841 Cataract extraction status, right eye: Secondary | ICD-10-CM | POA: Diagnosis not present

## 2018-04-10 DIAGNOSIS — Z86010 Personal history of colonic polyps: Secondary | ICD-10-CM | POA: Insufficient documentation

## 2018-04-10 DIAGNOSIS — Z79899 Other long term (current) drug therapy: Secondary | ICD-10-CM | POA: Insufficient documentation

## 2018-04-10 DIAGNOSIS — E119 Type 2 diabetes mellitus without complications: Secondary | ICD-10-CM | POA: Insufficient documentation

## 2018-04-10 DIAGNOSIS — Z85828 Personal history of other malignant neoplasm of skin: Secondary | ICD-10-CM | POA: Insufficient documentation

## 2018-04-10 DIAGNOSIS — I2510 Atherosclerotic heart disease of native coronary artery without angina pectoris: Secondary | ICD-10-CM | POA: Diagnosis not present

## 2018-04-10 DIAGNOSIS — Z87891 Personal history of nicotine dependence: Secondary | ICD-10-CM | POA: Diagnosis not present

## 2018-04-10 DIAGNOSIS — M1990 Unspecified osteoarthritis, unspecified site: Secondary | ICD-10-CM | POA: Diagnosis not present

## 2018-04-10 DIAGNOSIS — G5603 Carpal tunnel syndrome, bilateral upper limbs: Secondary | ICD-10-CM | POA: Insufficient documentation

## 2018-04-10 DIAGNOSIS — D649 Anemia, unspecified: Secondary | ICD-10-CM | POA: Diagnosis not present

## 2018-04-10 DIAGNOSIS — Z794 Long term (current) use of insulin: Secondary | ICD-10-CM | POA: Diagnosis not present

## 2018-04-10 DIAGNOSIS — G629 Polyneuropathy, unspecified: Secondary | ICD-10-CM | POA: Insufficient documentation

## 2018-04-10 DIAGNOSIS — Z8249 Family history of ischemic heart disease and other diseases of the circulatory system: Secondary | ICD-10-CM | POA: Insufficient documentation

## 2018-04-10 DIAGNOSIS — Z882 Allergy status to sulfonamides status: Secondary | ICD-10-CM | POA: Diagnosis not present

## 2018-04-10 DIAGNOSIS — I700 Atherosclerosis of aorta: Secondary | ICD-10-CM | POA: Diagnosis not present

## 2018-04-10 DIAGNOSIS — Z9842 Cataract extraction status, left eye: Secondary | ICD-10-CM | POA: Insufficient documentation

## 2018-04-10 DIAGNOSIS — Z833 Family history of diabetes mellitus: Secondary | ICD-10-CM | POA: Insufficient documentation

## 2018-04-10 DIAGNOSIS — D472 Monoclonal gammopathy: Secondary | ICD-10-CM | POA: Diagnosis not present

## 2018-04-10 DIAGNOSIS — Z8619 Personal history of other infectious and parasitic diseases: Secondary | ICD-10-CM | POA: Insufficient documentation

## 2018-04-10 DIAGNOSIS — Z9889 Other specified postprocedural states: Secondary | ICD-10-CM | POA: Diagnosis not present

## 2018-04-10 DIAGNOSIS — Z91030 Bee allergy status: Secondary | ICD-10-CM | POA: Diagnosis not present

## 2018-04-10 DIAGNOSIS — I7300 Raynaud's syndrome without gangrene: Secondary | ICD-10-CM | POA: Insufficient documentation

## 2018-04-10 HISTORY — PX: LOOP RECORDER INSERTION: EP1214

## 2018-04-10 LAB — GLUCOSE, CAPILLARY: GLUCOSE-CAPILLARY: 141 mg/dL — AB (ref 70–99)

## 2018-04-10 SURGERY — LOOP RECORDER INSERTION

## 2018-04-10 MED ORDER — LIDOCAINE-EPINEPHRINE 1 %-1:100000 IJ SOLN
INTRAMUSCULAR | Status: AC
  Filled 2018-04-10: qty 1

## 2018-04-10 MED ORDER — LIDOCAINE-EPINEPHRINE 1 %-1:100000 IJ SOLN
INTRAMUSCULAR | Status: DC | PRN
  Administered 2018-04-10: 10 mL

## 2018-04-10 SURGICAL SUPPLY — 2 items
LOOP REVEAL LINQSYS (Prosthesis & Implant Heart) ×2 IMPLANT
PACK LOOP INSERTION (CUSTOM PROCEDURE TRAY) ×2 IMPLANT

## 2018-04-10 NOTE — Interval H&P Note (Signed)
History and Physical Interval Note:  04/10/2018 1:08 PM  Tommy Peterson  has presented today for surgery, with the diagnosis of afib  The various methods of treatment have been discussed with the patient and family. After consideration of risks, benefits and other options for treatment, the patient has consented to  Procedure(s): LOOP RECORDER INSERTION (N/A) as a surgical intervention .  The patient's history has been reviewed, patient examined, no change in status, stable for surgery.  I have reviewed the patient's chart and labs.  Questions were answered to the patient's satisfaction.     Shadiamond Koska

## 2018-04-10 NOTE — Discharge Instructions (Signed)

## 2018-04-10 NOTE — Op Note (Signed)
LOOP RECORDER IMPLANT   Procedure report  Procedure performed:  Loop recorder implantation   Reason for procedure:  Atrial fibrillation, paroxysmal Procedure performed by:  [name], MD  Complications:  None  Estimated blood loss:  <5 mL  Medications administered during procedure:  Lidocaine 1% with 1/10,000 epinephrine 10 mL locally Device details:  Medtronic Reveal Linq model number [id number], serial number [id number] Procedure details:  After the risks and benefits of the procedure were discussed the patient provided informed consent. The patient was prepped and draped in usual sterile fashion. Local anesthesia was administered to an area 2 cm to the left of the sternum in the 4th intercostal space. A cutaneous incision was made using the incision tool. The introducer was then used to create a subcutaneous tunnel and carefully deploy the device. Local pressure was held to ensure hemostasis.  The incision was closed with SteriStrips and a sterile dressing was applied.  R waves 0.49 mV.  [name], MD, [place] [phone] office [phone] pager 04/10/2018 2:33 PM

## 2018-04-11 ENCOUNTER — Encounter (HOSPITAL_COMMUNITY): Payer: Self-pay | Admitting: Cardiovascular Disease

## 2018-04-24 ENCOUNTER — Ambulatory Visit (INDEPENDENT_AMBULATORY_CARE_PROVIDER_SITE_OTHER): Payer: Medicare HMO | Admitting: *Deleted

## 2018-04-24 DIAGNOSIS — I480 Paroxysmal atrial fibrillation: Secondary | ICD-10-CM

## 2018-04-24 LAB — CUP PACEART INCLINIC DEVICE CHECK
Date Time Interrogation Session: 20190918160352
MDC IDC PG IMPLANT DT: 20190904

## 2018-04-24 NOTE — Progress Notes (Signed)
Wound check in clinic s/p ILR implant. Steri strips removed prior to appointment. Wound well healed without redness or edema. Incision edges approximated. Normal ILR device function. Battery status: GOOD. R-waves 0.45mV. 0 symptom episodes, 0 tachy episodes, 0 pause episodes, 0 brady episodes. 0 AF episodes (0% burden). Patient education completed including remote monitoring, billing, and future follow up. Monthly summary reports and ROV with [place] in 3 months.

## 2018-04-26 DIAGNOSIS — Z961 Presence of intraocular lens: Secondary | ICD-10-CM | POA: Diagnosis not present

## 2018-04-26 DIAGNOSIS — H5202 Hypermetropia, left eye: Secondary | ICD-10-CM | POA: Diagnosis not present

## 2018-04-26 DIAGNOSIS — E113213 Type 2 diabetes mellitus with mild nonproliferative diabetic retinopathy with macular edema, bilateral: Secondary | ICD-10-CM | POA: Diagnosis not present

## 2018-05-02 DIAGNOSIS — D649 Anemia, unspecified: Secondary | ICD-10-CM | POA: Diagnosis not present

## 2018-05-02 DIAGNOSIS — Z79899 Other long term (current) drug therapy: Secondary | ICD-10-CM | POA: Diagnosis not present

## 2018-05-02 DIAGNOSIS — C9110 Chronic lymphocytic leukemia of B-cell type not having achieved remission: Secondary | ICD-10-CM | POA: Diagnosis not present

## 2018-05-02 DIAGNOSIS — D72820 Lymphocytosis (symptomatic): Secondary | ICD-10-CM | POA: Diagnosis not present

## 2018-05-02 DIAGNOSIS — Z87891 Personal history of nicotine dependence: Secondary | ICD-10-CM | POA: Diagnosis not present

## 2018-05-02 DIAGNOSIS — D472 Monoclonal gammopathy: Secondary | ICD-10-CM | POA: Diagnosis not present

## 2018-05-13 ENCOUNTER — Ambulatory Visit (INDEPENDENT_AMBULATORY_CARE_PROVIDER_SITE_OTHER): Payer: Medicare HMO | Admitting: *Deleted

## 2018-05-13 DIAGNOSIS — I480 Paroxysmal atrial fibrillation: Secondary | ICD-10-CM | POA: Diagnosis not present

## 2018-05-14 NOTE — Progress Notes (Signed)
Carelink Summary Report / Loop Recorder 

## 2018-05-23 DIAGNOSIS — C9110 Chronic lymphocytic leukemia of B-cell type not having achieved remission: Secondary | ICD-10-CM | POA: Diagnosis not present

## 2018-05-23 DIAGNOSIS — D72820 Lymphocytosis (symptomatic): Secondary | ICD-10-CM | POA: Diagnosis not present

## 2018-05-23 DIAGNOSIS — D649 Anemia, unspecified: Secondary | ICD-10-CM | POA: Diagnosis not present

## 2018-05-23 DIAGNOSIS — D472 Monoclonal gammopathy: Secondary | ICD-10-CM | POA: Diagnosis not present

## 2018-05-27 LAB — CUP PACEART REMOTE DEVICE CHECK
Date Time Interrogation Session: 20191007181027
MDC IDC PG IMPLANT DT: 20190904

## 2018-05-29 DIAGNOSIS — R351 Nocturia: Secondary | ICD-10-CM | POA: Diagnosis not present

## 2018-05-29 DIAGNOSIS — Z87440 Personal history of urinary (tract) infections: Secondary | ICD-10-CM | POA: Diagnosis not present

## 2018-05-29 DIAGNOSIS — N401 Enlarged prostate with lower urinary tract symptoms: Secondary | ICD-10-CM | POA: Diagnosis not present

## 2018-06-17 ENCOUNTER — Ambulatory Visit (INDEPENDENT_AMBULATORY_CARE_PROVIDER_SITE_OTHER): Payer: Medicare HMO | Admitting: *Deleted

## 2018-06-17 DIAGNOSIS — I480 Paroxysmal atrial fibrillation: Secondary | ICD-10-CM

## 2018-06-17 NOTE — Progress Notes (Signed)
Carelink Summary Report / Loop Recorder 

## 2018-06-20 DIAGNOSIS — D472 Monoclonal gammopathy: Secondary | ICD-10-CM | POA: Diagnosis not present

## 2018-06-20 DIAGNOSIS — C9110 Chronic lymphocytic leukemia of B-cell type not having achieved remission: Secondary | ICD-10-CM | POA: Diagnosis not present

## 2018-06-20 DIAGNOSIS — D649 Anemia, unspecified: Secondary | ICD-10-CM | POA: Diagnosis not present

## 2018-06-24 DIAGNOSIS — Z794 Long term (current) use of insulin: Secondary | ICD-10-CM | POA: Diagnosis not present

## 2018-06-24 DIAGNOSIS — E119 Type 2 diabetes mellitus without complications: Secondary | ICD-10-CM | POA: Diagnosis not present

## 2018-06-24 DIAGNOSIS — K529 Noninfective gastroenteritis and colitis, unspecified: Secondary | ICD-10-CM | POA: Diagnosis not present

## 2018-07-01 ENCOUNTER — Telehealth: Payer: Self-pay | Admitting: *Deleted

## 2018-07-01 NOTE — Telephone Encounter (Signed)
PAF episode noted on 06/30/18, duration 90min. Apixaban is listed on medication list, though marked as "patient not taking". Upcoming appointment with Dr. Sallyanne Kuster on 07/17/18. Routed to Dr. Sallyanne Kuster for recommendations.

## 2018-07-01 NOTE — Telephone Encounter (Signed)
Thanks, will review at upcoming appt. MCr

## 2018-07-17 ENCOUNTER — Encounter: Payer: Self-pay | Admitting: Cardiovascular Disease

## 2018-07-17 ENCOUNTER — Ambulatory Visit (INDEPENDENT_AMBULATORY_CARE_PROVIDER_SITE_OTHER): Payer: Medicare HMO | Admitting: Cardiovascular Disease

## 2018-07-17 VITALS — BP 118/64 | HR 96 | Ht 69.5 in | Wt 172.6 lb

## 2018-07-17 DIAGNOSIS — E119 Type 2 diabetes mellitus without complications: Secondary | ICD-10-CM

## 2018-07-17 DIAGNOSIS — I700 Atherosclerosis of aorta: Secondary | ICD-10-CM | POA: Diagnosis not present

## 2018-07-17 DIAGNOSIS — Z4509 Encounter for adjustment and management of other cardiac device: Secondary | ICD-10-CM | POA: Diagnosis not present

## 2018-07-17 DIAGNOSIS — C9110 Chronic lymphocytic leukemia of B-cell type not having achieved remission: Secondary | ICD-10-CM | POA: Diagnosis not present

## 2018-07-17 DIAGNOSIS — I480 Paroxysmal atrial fibrillation: Secondary | ICD-10-CM

## 2018-07-17 NOTE — Progress Notes (Signed)
Cardiology Office Note:    Date:  07/17/2018   ID:  Tommy Peterson, DOB 1941-02-12, MRN [id number]  PCP:  Jani Gravel, MD  Cardiologist:  [name], MD   Referring MD: Jani Gravel, MD   Chief Complaint  Patient presents with  . Follow-up    loop recorder    History of Present Illness:    Tommy Peterson is a 77 y.o. 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  He is very reluctant to take anticoagulation due to serious problems with anemia related to his leukemia in the past.  He remains moderately anemic.  He does not think he has ever had atrial fibrillation except for the one acute event and does not think he will have it again.  The loop recorder was implanted to make sure that it is safe for him not to be treated with anticoagulants.  The patient specifically denies any chest pain at rest exertion, dyspnea at rest or with exertion, orthopnea, paroxysmal nocturnal dyspnea, syncope, palpitations, focal neurological deficits, intermittent claudication, lower extremity edema, unexplained weight gain, cough, hemoptysis or wheezing.  The loop recorder site has healed well and does not bother him  CHA2DS2-VASc Score =4(age 28, HTN -questionable dx, DM), 4.8% stroke risk per year.HASBLED 1.  He does not have a history of coronary artery disease or peripheral arterial disease.  In 2017 he was admitted with an episode of chest pain was negative.  There was evidence of aortic and coronary calcifications on a CT of the chest performed in January 2018.  Past Medical History:  Diagnosis Date  . Acute respiratory failure ([place])   . BPH (benign prostatic hyperplasia)   . Carpal tunnel syndrome on both  sides   . Chronic diarrhea    intermittant due to chronic collagenous colitis  . Chronic large granular lymphocytic leukemia ([place]) primary hemotologist-  [name]. 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 Collagenous colitis    chronic--- intermittant between diarrahea and constipation  . ED (erectile dysfunction)   . HCAP (healthcare-associated pneumonia)   . History of adenomatous polyp of colon   . History of Clostridium difficile colitis 05/2016  . History of pneumonia 11/08/2017   CAP -- LLL---  12-19-2017  per pt no residual symptoms  . History of SCC (squamous cell carcinoma) of skin   . History of sepsis    11-20-2017 severe sepsis due to UTI;  10/ 2017sepsis due to c-diff colitis  . Leucocytosis    chronic  . Lower urinary tract symptoms (LUTS)   . Macrocytic anemia    since 02/ 2015  . MGUS (monoclonal gammopathy of unknown significance)    hemotologist-  [name] @ [facility]  . Monoclonal paraproteinemia   . Neuropathy, peripheral   . OA (osteoarthritis)   . Prostatic stone   . Rash of face    RIGHT SIDE  . Raynaud's syndrome   . Thin skin    fragile  . Transfusion-dependent anemia since 10/ 2017   last transfusion PRBCs 07-19-2017  per hemologist note dated 10-25-2017  . Type  2 diabetes mellitus treated with insulin ([place])    followed by [name] (pcp)  . Urinary hesitancy   . Wears contact lenses    LEFT EYE ONLY    Past Surgical History:  Procedure Laterality Date  . BONE MARROW BIOPSY Right 05-24-2016;  08-20-2014  . CARDIOVASCULAR STRESS TEST  11-26-2012   [name]  @ [facility] ([place]   normal nuclear study w/ no ischemia/  normal LV function and wall motion , ef 60% (find in care everywhere,epic)  . CATARACT EXTRACTION W/  INTRAOCULAR LENS  IMPLANT, BILATERAL  2015  . CYSTOSCOPY WITH LITHOLAPAXY N/A 12/25/2017   Procedure: CYSTOSCOPY WITH LITHOLAPAXY AND FULGERATION;  Surgeon: [name], MD;  Location: [facility];  Service: Urology;  Laterality: N/A;  . INGUINAL HERNIA REPAIR Right 1980s  . LOOP RECORDER INSERTION N/A 04/10/2018   Procedure: LOOP RECORDER INSERTION;  Surgeon: [name], MD;  Location: [facility];  Service: Cardiovascular;  Laterality: N/A;  . PENILE PROSTHESIS IMPLANT  12-02-2015   dr [name] @ [facility] in [place]  . SHOULDER SURGERY Left 08/2016  . TONSILLECTOMY  child  . TRANSURETHRAL RESECTION OF PROSTATE  2009   AND REPAIR RECURRENT RIGHT INGUINAL HERNIA    Current Medications: Current Meds  Medication Sig  . acetaminophen (TYLENOL) 325 MG tablet Take 2 tablets (650 mg total) by mouth every 6 (six) hours as needed for mild pain (or Fever >/= 101).  . Ascorbic Acid (VITAMIN C) 1000 MG tablet Take 1,000 mg by mouth daily.  . budesonide (ENTOCORT EC) 3 MG 24 hr capsule Take 3 mg by mouth daily.   . Insulin Glargine-Lixisenatide (SOLIQUA) 100-33 UNT-MCG/ML SOPN Inject 28 Units into the skin at bedtime.   . methotrexate (RHEUMATREX) 2.5 MG tablet Take 30 mg by mouth every Tuesday.   . pioglitazone (ACTOS) 15 MG tablet Take 15 mg by mouth every morning.   . vitamin B-12 (CYANOCOBALAMIN) 500 MCG tablet Take 1 tablet (500 mcg total) by mouth daily.  . [DISCONTINUED] metoprolol tartrate (LOPRESSOR) 50 MG tablet Take 1 tablet (50 mg total) by mouth 2 (two) times daily.     Allergies:   Bee venom; Metformin and related; and Sulfamethoxazole-trimethoprim   Social History   Socioeconomic History  . Marital status: Widowed    Spouse name: engaged  . Number of children: 8  . Years of education: college-2  . Highest education level: Not on file  Occupational History  . Occupation: Press photographer  . Occupation: [employer]  Social Needs  .  Financial resource strain: Not on file  . Food insecurity:    Worry: Not on file    Inability: Not on file  . Transportation needs:    Medical: Not on file    Non-medical: Not on file  Tobacco Use  . Smoking status: Former Smoker    Years: 20.00    Types: Cigarettes    Last attempt to quit: 05/04/1996    Years since quitting: 22.2  . Smokeless tobacco: Never Used  Substance and Sexual Activity  . Alcohol use: Yes    Comment: 1-2 glasses of wine every other day  . Drug use: No  . Sexual activity: Never  Lifestyle  . Physical activity:    Days per week: Not on file    Minutes per session: Not on file  . Stress: Not on file  Relationships  . Social connections:    Talks on phone:  Not on file    Gets together: Not on file    Attends religious service: Not on file    Active member of club or organization: Not on file    Attends meetings of clubs or organizations: Not on file    Relationship status: Not on file  Other Topics Concern  . Not on file  Social History Narrative  . Not on file     Family History: The patient's family history includes Diabetes in his mother; Heart failure in his mother.  ROS:   Please see the history of present illness.    All other systems are reviewed and are negative  EKGs/Labs/Other Studies Reviewed:    The following studies were reviewed today: ECHO 02/20/2018  - Left ventricle: The cavity size was normal. Wall thickness was   increased in a pattern of moderate LVH. Systolic function was   normal. The estimated ejection fraction was in the range of 60%   to 65%. Wall motion was normal; there were no regional wall   motion abnormalities. - Aortic valve: Transvalvular velocity was within the normal range.   There was no stenosis. There was no regurgitation. - Mitral valve: Transvalvular velocity was within the normal range.   There was no evidence for stenosis. There was trivial   regurgitation. - Left atrium: The atrium was  moderately dilated. - Right ventricle: The cavity size was normal. Wall thickness was   normal. Systolic function was normal. - Right atrium: The atrium was moderately dilated. - Atrial septum: No defect or patent foramen ovale was identified   by color flow Doppler. - Tricuspid valve: There was mild regurgitation. - Pulmonary arteries: Systolic pressure was mildly increased. PA   peak pressure: 44 mm Hg (S).   EKG:  EKG is not ordered today.  The ekg ordered 02/19/2018 demonstrates AFib with RVR, LAFB. Admission ECG on 02/18/2018 - NSR with PACs  Recent Labs: 11/22/2017: Magnesium 1.9 02/18/2018: ALT 15; B Natriuretic Peptide 47.8 02/20/2018: TSH 0.991 02/21/2018: BUN 8; Creatinine, Ser 0.53; Hemoglobin 8.0; Platelets 308; Potassium 3.4; Sodium 139  Recent Lipid Panel    Component Value Date/Time   CHOL 111 05/05/2016 0402   TRIG 106 05/05/2016 0402   HDL 27 (L) 05/05/2016 0402   CHOLHDL 4.1 05/05/2016 0402   VLDL 21 05/05/2016 0402   LDLCALC 63 05/05/2016 0402    Physical Exam:    VS:  BP 118/64   Pulse 96   Ht 5' 9.5" (1.765 m)   Wt 172 lb 9.6 oz (78.3 kg)   BMI 25.12 kg/m     Wt Readings from Last 3 Encounters:  07/17/18 172 lb 9.6 oz (78.3 kg)  04/05/18 172 lb 6.4 oz (78.2 kg)  02/18/18 163 lb 7.2 oz (74.1 kg)     General: Alert, oriented x3, no distress, well-healed left parasternal loop recorder site Head: no evidence of trauma, PERRL, EOMI, no exophtalmos or lid lag, no myxedema, no xanthelasma; normal ears, nose and oropharynx Neck: normal jugular venous pulsations and no hepatojugular reflux; brisk carotid pulses without delay and no carotid bruits Chest: clear to auscultation, no signs of consolidation by percussion or palpation, normal fremitus, symmetrical and full respiratory excursions Cardiovascular: normal position and quality of the apical impulse, regular rhythm, normal first and second heart sounds, no murmurs, rubs or gallops Abdomen: no tenderness  or distention, no masses by palpation, no abnormal pulsatility or arterial bruits, normal bowel sounds, no hepatosplenomegaly Extremities: no clubbing, cyanosis or edema; 2+  radial, ulnar and brachial pulses bilaterally; 2+ right femoral, posterior tibial and dorsalis pedis pulses; 2+ left femoral, posterior tibial and dorsalis pedis pulses; no subclavian or femoral bruits Neurological: grossly nonfocal Psych: Normal mood and affect   ASSESSMENT:    1. Paroxysmal atrial fibrillation (HCC)   2. Encounter for loop recorder check   3. CLL (chronic lymphocytic leukemia) ([place])   4. Diabetes mellitus type 2 in nonobese (HCC)   5. Atherosclerosis of aorta (HCC)    PLAN:    In order of problems listed above:  1. AFib: It is possible that he had a one-time event related to pneumonia, but in view of his advanced age it is important to exclude recurrent paroxysmal atrial fibrillation.  Today his loop recorder has not shown any arrhythmia.  Continue to monitor remotely.  If we do detect atrial fibrillation and he does not want to take anticoagulants long-term, consider referral for watchman. 2. ILR: Well-healed site.  No arrhythmia detected to date. 3. Leukemia/chronic anemia: Due to baseline anemia, hemorrhage would have more serious implications. 4. DM: No known history of coronary or peripheral vascular disease.  He is on Actos without evidence of fluid overload/heart failure. 5. Aortic and coronary atherosclerosis: Noted on CT chest.  Asymptomatic.  LDL cholesterol was 63 in 2017   Medication Adjustments/Labs and Tests Ordered: Current medicines are reviewed at length with the patient today.  Concerns regarding medicines are outlined above.  Orders Placed This Encounter  Procedures  . EKG 12-Lead   No orders of the defined types were placed in this encounter.   Patient Instructions  Medication Instructions:  Dr Sallyanne Kuster recommends that you continue on your current medications as directed.  Please refer to the Current Medication list given to you today.  If you need a refill on your cardiac medications before your next appointment, please call your pharmacy.   Follow-Up: At [facility], you and your health needs are our priority.  As part of our continuing mission to provide you with exceptional heart care, we have created designated Provider Care Teams.  These Care Teams include your primary Cardiologist (physician) and Advanced Practice Providers (APPs -  Physician Assistants and Nurse Practitioners) who all work together to provide you with the care you need, when you need it. You will need a follow up appointment in 12 months.  Please call our office 2 months in advance to schedule this appointment.  You may see [name], MD or one of the following Advanced Practice Providers on your designated Care Team: [place], [place] . Fabian Sharp, PA-C . You will receive a reminder letter in the mail two months in advance. If you don't receive a letter, please call our office to schedule the follow-up appointment.    Signed, [name], MD  07/17/2018 3:48 PM    [place]

## 2018-07-17 NOTE — Patient Instructions (Signed)
Medication Instructions:  Dr Croitoru recommends that you continue on your current medications as directed. Please refer to the Current Medication list given to you today.  If you need a refill on your cardiac medications before your next appointment, please call your pharmacy.   Follow-Up: At [facility], you and your health needs are our priority.  As part of our continuing mission to provide you with exceptional heart care, we have created designated Provider Care Teams.  These Care Teams include your primary Cardiologist (physician) and Advanced Practice Providers (APPs -  Physician Assistants and Nurse Practitioners) who all work together to provide you with the care you need, when you need it. You will need a follow up appointment in 12 months.  Please call our office 2 months in advance to schedule this appointment.  You may see Mihai Croitoru, MD or one of the following Advanced Practice Providers on your designated Care Team: Hao Meng, PA-C . Marenda Accardi [facility], PA-C . You will receive a reminder letter in the mail two months in advance. If you don't receive a letter, please call our office to schedule the follow-up appointment. 

## 2018-07-18 ENCOUNTER — Ambulatory Visit (INDEPENDENT_AMBULATORY_CARE_PROVIDER_SITE_OTHER): Payer: Medicare HMO

## 2018-07-18 DIAGNOSIS — I480 Paroxysmal atrial fibrillation: Secondary | ICD-10-CM | POA: Diagnosis not present

## 2018-07-19 NOTE — Progress Notes (Signed)
Carelink Summary Report / Loop Recorder 

## 2018-07-28 ENCOUNTER — Other Ambulatory Visit: Payer: Self-pay

## 2018-07-28 ENCOUNTER — Encounter (HOSPITAL_COMMUNITY): Payer: Self-pay

## 2018-07-28 ENCOUNTER — Emergency Department (HOSPITAL_COMMUNITY): Payer: Medicare HMO

## 2018-07-28 ENCOUNTER — Inpatient Hospital Stay (HOSPITAL_COMMUNITY)
Admission: EM | Admit: 2018-07-28 | Discharge: 2018-07-30 | DRG: 871 | Disposition: A | Discharge status: Home / Self Care | Payer: Medicare HMO | Attending: Internal Medicine | Admitting: Internal Medicine

## 2018-07-28 DIAGNOSIS — K529 Noninfective gastroenteritis and colitis, unspecified: Secondary | ICD-10-CM | POA: Diagnosis present

## 2018-07-28 DIAGNOSIS — J189 Pneumonia, unspecified organism: Secondary | ICD-10-CM | POA: Diagnosis not present

## 2018-07-28 DIAGNOSIS — N400 Enlarged prostate without lower urinary tract symptoms: Secondary | ICD-10-CM | POA: Diagnosis not present

## 2018-07-28 DIAGNOSIS — I959 Hypotension, unspecified: Secondary | ICD-10-CM | POA: Diagnosis not present

## 2018-07-28 DIAGNOSIS — R000 Tachycardia, unspecified: Secondary | ICD-10-CM | POA: Diagnosis not present

## 2018-07-28 DIAGNOSIS — R0902 Hypoxemia: Secondary | ICD-10-CM | POA: Diagnosis not present

## 2018-07-28 DIAGNOSIS — G9341 Metabolic encephalopathy: Secondary | ICD-10-CM

## 2018-07-28 DIAGNOSIS — J9621 Acute and chronic respiratory failure with hypoxia: Secondary | ICD-10-CM | POA: Diagnosis not present

## 2018-07-28 DIAGNOSIS — E1165 Type 2 diabetes mellitus with hyperglycemia: Secondary | ICD-10-CM | POA: Diagnosis not present

## 2018-07-28 DIAGNOSIS — Z882 Allergy status to sulfonamides status: Secondary | ICD-10-CM

## 2018-07-28 DIAGNOSIS — E1142 Type 2 diabetes mellitus with diabetic polyneuropathy: Secondary | ICD-10-CM | POA: Diagnosis not present

## 2018-07-28 DIAGNOSIS — Z794 Long term (current) use of insulin: Secondary | ICD-10-CM | POA: Diagnosis not present

## 2018-07-28 DIAGNOSIS — R410 Disorientation, unspecified: Secondary | ICD-10-CM | POA: Diagnosis not present

## 2018-07-28 DIAGNOSIS — Z79899 Other long term (current) drug therapy: Secondary | ICD-10-CM

## 2018-07-28 DIAGNOSIS — K5909 Other constipation: Secondary | ICD-10-CM | POA: Diagnosis not present

## 2018-07-28 DIAGNOSIS — J9601 Acute respiratory failure with hypoxia: Secondary | ICD-10-CM | POA: Diagnosis present

## 2018-07-28 DIAGNOSIS — R739 Hyperglycemia, unspecified: Secondary | ICD-10-CM | POA: Diagnosis not present

## 2018-07-28 DIAGNOSIS — C91Z0 Other lymphoid leukemia not having achieved remission: Secondary | ICD-10-CM | POA: Diagnosis not present

## 2018-07-28 DIAGNOSIS — Z888 Allergy status to other drugs, medicaments and biological substances status: Secondary | ICD-10-CM

## 2018-07-28 DIAGNOSIS — R918 Other nonspecific abnormal finding of lung field: Secondary | ICD-10-CM | POA: Diagnosis not present

## 2018-07-28 DIAGNOSIS — I480 Paroxysmal atrial fibrillation: Secondary | ICD-10-CM | POA: Diagnosis not present

## 2018-07-28 DIAGNOSIS — I7300 Raynaud's syndrome without gangrene: Secondary | ICD-10-CM | POA: Diagnosis not present

## 2018-07-28 DIAGNOSIS — A419 Sepsis, unspecified organism: Principal | ICD-10-CM | POA: Diagnosis present

## 2018-07-28 DIAGNOSIS — Z91030 Bee allergy status: Secondary | ICD-10-CM

## 2018-07-28 DIAGNOSIS — R509 Fever, unspecified: Secondary | ICD-10-CM | POA: Diagnosis not present

## 2018-07-28 DIAGNOSIS — G9340 Encephalopathy, unspecified: Secondary | ICD-10-CM | POA: Diagnosis present

## 2018-07-28 DIAGNOSIS — D539 Nutritional anemia, unspecified: Secondary | ICD-10-CM | POA: Diagnosis present

## 2018-07-28 DIAGNOSIS — D72829 Elevated white blood cell count, unspecified: Secondary | ICD-10-CM

## 2018-07-28 LAB — INFLUENZA PANEL BY PCR (TYPE A & B)
Influenza A By PCR: NEGATIVE
Influenza B By PCR: NEGATIVE

## 2018-07-28 LAB — CBC WITH DIFFERENTIAL/PLATELET
Abs Immature Granulocytes: 0.3 10*3/uL — ABNORMAL HIGH (ref 0.00–0.07)
BASOS ABS: 0 10*3/uL (ref 0.0–0.1)
Basophils Relative: 0 %
EOS PCT: 0 %
Eosinophils Absolute: 0 10*3/uL (ref 0.0–0.5)
HCT: 28.5 % — ABNORMAL LOW (ref 39.0–52.0)
Hemoglobin: 9 g/dL — ABNORMAL LOW (ref 13.0–17.0)
Immature Granulocytes: 2 %
Lymphocytes Relative: 6 %
Lymphs Abs: 0.9 10*3/uL (ref 0.7–4.0)
MCH: 38.5 pg — ABNORMAL HIGH (ref 26.0–34.0)
MCHC: 31.6 g/dL (ref 30.0–36.0)
MCV: 121.8 fL — AB (ref 80.0–100.0)
Monocytes Absolute: 1.1 10*3/uL — ABNORMAL HIGH (ref 0.1–1.0)
Monocytes Relative: 7 %
Neutro Abs: 13.5 10*3/uL — ABNORMAL HIGH (ref 1.7–7.7)
Neutrophils Relative %: 85 %
Platelets: 360 10*3/uL (ref 150–400)
RBC: 2.34 MIL/uL — ABNORMAL LOW (ref 4.22–5.81)
RDW: 19.9 % — ABNORMAL HIGH (ref 11.5–15.5)
WBC: 15.8 10*3/uL — ABNORMAL HIGH (ref 4.0–10.5)
nRBC: 0 % (ref 0.0–0.2)

## 2018-07-28 LAB — COMPREHENSIVE METABOLIC PANEL
ALT: 21 U/L (ref 0–44)
AST: 21 U/L (ref 15–41)
Albumin: 3.5 g/dL (ref 3.5–5.0)
Alkaline Phosphatase: 68 U/L (ref 38–126)
Anion gap: 10 (ref 5–15)
BUN: 14 mg/dL (ref 8–23)
CO2: 20 mmol/L — ABNORMAL LOW (ref 22–32)
Calcium: 8.4 mg/dL — ABNORMAL LOW (ref 8.9–10.3)
Chloride: 106 mmol/L (ref 98–111)
Creatinine, Ser: 0.59 mg/dL — ABNORMAL LOW (ref 0.61–1.24)
GFR calc non Af Amer: >60 mL/min (ref >60)
Glucose, Bld: 344 mg/dL — ABNORMAL HIGH (ref 70–99)
Potassium: 4.2 mmol/L (ref 3.5–5.1)
Sodium: 136 mmol/L (ref 135–145)
Total Bilirubin: 1.2 mg/dL (ref 0.3–1.2)
Total Protein: 6.1 g/dL — ABNORMAL LOW (ref 6.5–8.1)

## 2018-07-28 LAB — I-STAT CG4 LACTIC ACID, ED
LACTIC ACID, VENOUS: 1.66 mmol/L (ref 0.5–1.9)
LACTIC ACID, VENOUS: 2.24 mmol/L — AB (ref 0.5–1.9)

## 2018-07-28 LAB — PROCALCITONIN: Procalcitonin: 0.18 ng/mL

## 2018-07-28 IMAGING — CR DG CHEST 2V
2 series · 2 of 2 positions shown · non-contrast
Comparison: [DATE]

CLINICAL DATA: Fevers with nausea and vomiting

EXAM:
CHEST - 2 VIEW

[w chest lat]
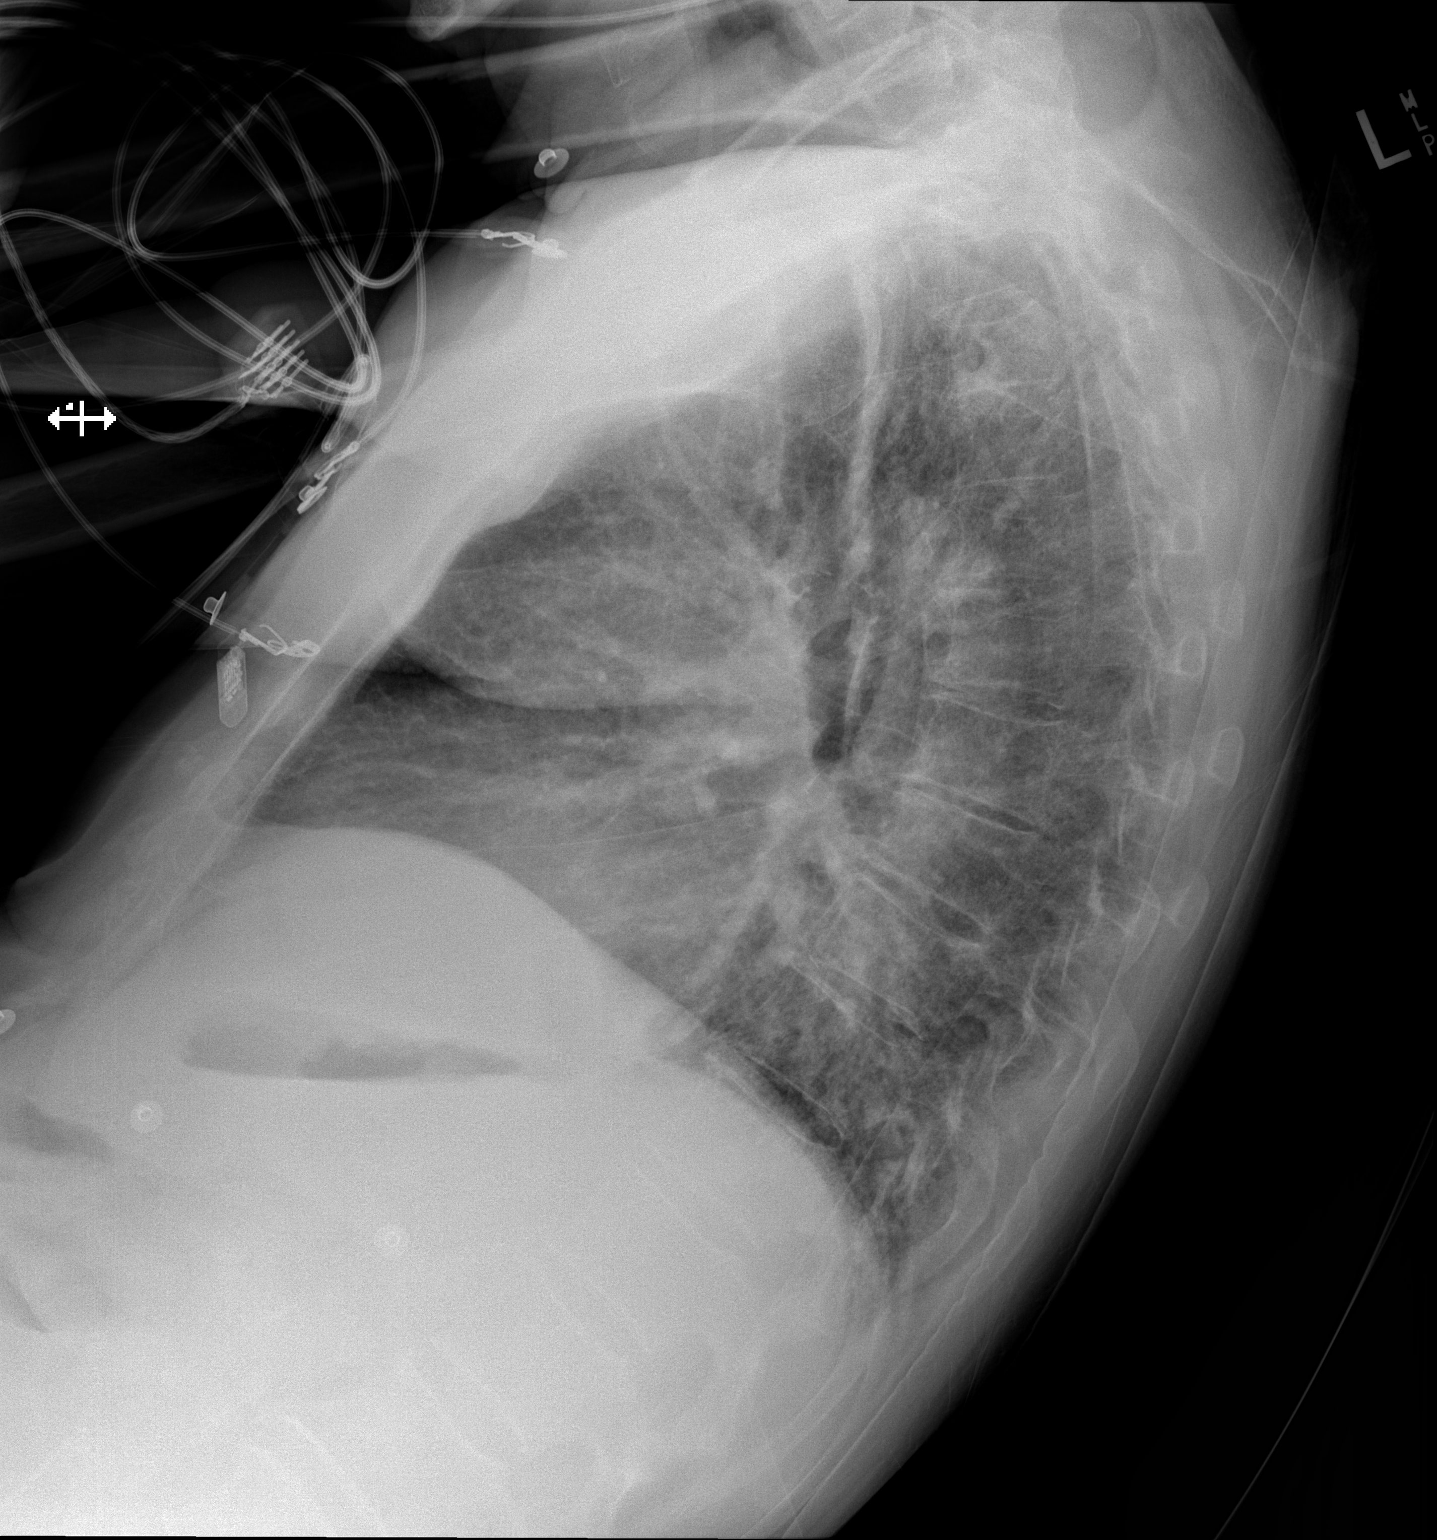

[x chest ap]
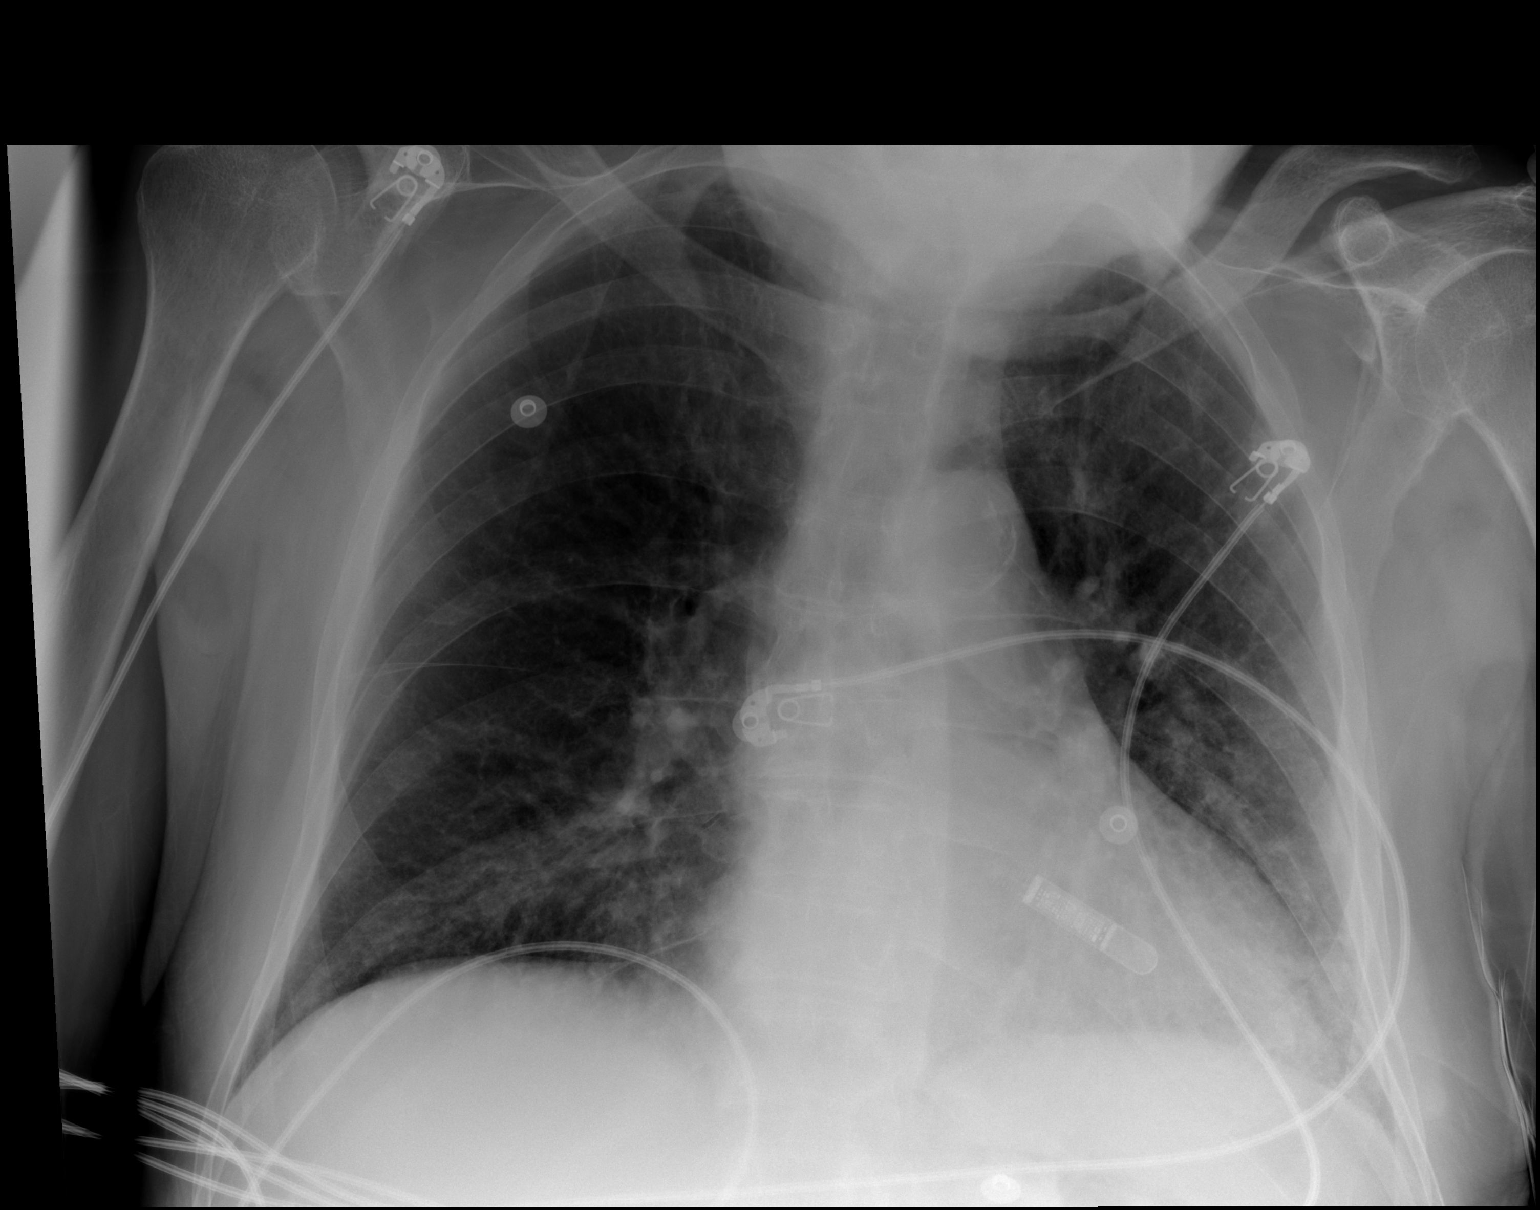

[2 of 2 positions shown; findings below may reference images not displayed]

FINDINGS: Cardiac shadow is at the upper limits of normal in size. Aortic
calcifications are again seen. Loop recorder is now noted. The lungs
are well aerated bilaterally. Patchy bibasilar infiltrative changes
are seen slightly worse on the left than the right.. No bony
abnormality is noted.
IMPRESSION: Bibasilar infiltrates left greater than right.

## 2018-07-28 MED ORDER — ACETAMINOPHEN 325 MG PO TABS: 650.0000 mg | ORAL_TABLET | Freq: Four times a day (QID) | ORAL | Status: DC | PRN

## 2018-07-28 MED ORDER — SODIUM CHLORIDE 0.9 % IV BOLUS (SEPSIS)
1000.0000 mL | Freq: Once | INTRAVENOUS | Status: AC
  Administered 2018-07-28: 1000 mL via INTRAVENOUS

## 2018-07-28 MED ORDER — ONDANSETRON HCL 4 MG/2ML IJ SOLN: 4.0000 mg | Freq: Four times a day (QID) | INTRAMUSCULAR | Status: DC | PRN

## 2018-07-28 MED ORDER — SODIUM CHLORIDE 0.9 % IV SOLN
500.0000 mg | Freq: Once | INTRAVENOUS | Status: AC
  Administered 2018-07-28: 500 mg via INTRAVENOUS
  Filled 2018-07-28: qty 500

## 2018-07-28 MED ORDER — IBUPROFEN 800 MG PO TABS
800.0000 mg | ORAL_TABLET | Freq: Once | ORAL | Status: AC
  Administered 2018-07-28: 800 mg via ORAL
  Filled 2018-07-28: qty 1

## 2018-07-28 MED ORDER — ACETAMINOPHEN 650 MG RE SUPP: 650.0000 mg | Freq: Four times a day (QID) | RECTAL | Status: DC | PRN

## 2018-07-28 MED ORDER — IPRATROPIUM-ALBUTEROL 0.5-2.5 (3) MG/3ML IN SOLN: 3.0000 mL | RESPIRATORY_TRACT | Status: DC | PRN

## 2018-07-28 MED ORDER — SODIUM CHLORIDE 0.9 % IV SOLN
INTRAVENOUS | Status: DC
  Administered 2018-07-28 – 2018-07-30 (×5): via INTRAVENOUS

## 2018-07-28 MED ORDER — SODIUM CHLORIDE 0.9 % IV BOLUS (SEPSIS)
500.0000 mL | Freq: Once | INTRAVENOUS | Status: AC
  Administered 2018-07-28: 500 mL via INTRAVENOUS

## 2018-07-28 MED ORDER — ASPIRIN EC 325 MG PO TBEC: 650.0000 mg | DELAYED_RELEASE_TABLET | Freq: Four times a day (QID) | ORAL | Status: DC | PRN

## 2018-07-28 MED ORDER — BUDESONIDE 3 MG PO CPEP
3.0000 mg | ORAL_CAPSULE | Freq: Every day | ORAL | Status: DC
  Administered 2018-07-29 – 2018-07-30 (×2): 3 mg via ORAL
  Filled 2018-07-28 (×2): qty 1

## 2018-07-28 MED ORDER — INSULIN GLARGINE-LIXISENATIDE 100-33 UNT-MCG/ML ~~LOC~~ SOPN: 28.0000 [IU] | PEN_INJECTOR | Freq: Every day | SUBCUTANEOUS | Status: DC

## 2018-07-28 MED ORDER — ACETAMINOPHEN 500 MG PO TABS
1000.0000 mg | ORAL_TABLET | Freq: Once | ORAL | Status: AC
  Administered 2018-07-29: 1000 mg via ORAL
  Filled 2018-07-28: qty 2

## 2018-07-28 MED ORDER — SODIUM CHLORIDE 0.9% FLUSH
3.0000 mL | Freq: Two times a day (BID) | INTRAVENOUS | Status: DC
  Administered 2018-07-29 – 2018-07-30 (×2): 3 mL via INTRAVENOUS

## 2018-07-28 MED ORDER — METHOTREXATE 2.5 MG PO TABS: 30.0000 mg | ORAL_TABLET | ORAL | Status: DC

## 2018-07-28 MED ORDER — ENOXAPARIN SODIUM 40 MG/0.4ML ~~LOC~~ SOLN
40.0000 mg | Freq: Every day | SUBCUTANEOUS | Status: DC
  Administered 2018-07-29 (×2): 40 mg via SUBCUTANEOUS
  Filled 2018-07-28: qty 0.4

## 2018-07-28 MED ORDER — ONDANSETRON HCL 4 MG/2ML IJ SOLN
4.0000 mg | Freq: Once | INTRAMUSCULAR | Status: AC
  Administered 2018-07-28: 4 mg via INTRAVENOUS
  Filled 2018-07-28: qty 2

## 2018-07-28 MED ORDER — GUAIFENESIN ER 600 MG PO TB12
600.0000 mg | ORAL_TABLET | Freq: Two times a day (BID) | ORAL | Status: DC
  Administered 2018-07-28 – 2018-07-30 (×4): 600 mg via ORAL
  Filled 2018-07-28 (×4): qty 1

## 2018-07-28 MED ORDER — ONDANSETRON HCL 4 MG PO TABS: 4.0000 mg | ORAL_TABLET | Freq: Four times a day (QID) | ORAL | Status: DC | PRN

## 2018-07-28 MED ORDER — SODIUM CHLORIDE 0.9 % IV SOLN
1.0000 g | INTRAVENOUS | Status: DC
  Administered 2018-07-28 – 2018-07-29 (×2): 1 g via INTRAVENOUS
  Filled 2018-07-28 (×2): qty 10
  Filled 2018-07-28: qty 1

## 2018-07-28 MED ORDER — LORAZEPAM 2 MG/ML IJ SOLN
1.0000 mg | Freq: Once | INTRAMUSCULAR | Status: AC
  Administered 2018-07-28: 1 mg via INTRAVENOUS
  Filled 2018-07-28: qty 1

## 2018-07-28 MED ORDER — INSULIN ASPART 100 UNIT/ML ~~LOC~~ SOLN
0.0000 [IU] | Freq: Three times a day (TID) | SUBCUTANEOUS | Status: DC
  Administered 2018-07-29: 3 [IU] via SUBCUTANEOUS
  Administered 2018-07-30: 1 [IU] via SUBCUTANEOUS

## 2018-07-28 NOTE — ED Provider Notes (Addendum)
[facility] DEPT Provider Note   CSN: [id number] Arrival date & time: 07/28/18  1944     History   Chief Complaint Chief Complaint  Patient presents with  . Fever    HPI Tommy Peterson is a 77 y.o. male.  Pt presents to the ED today with fever and confusion.  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  He has had multiple pneumonias and UTIs.  The pt's wife said he was agitated last night and did not sleep much.  He developed a fever today and confusion became worse.  He has had a cough.  He has diarrhea, but that looks chronic.  The pt is a poor historian.       Past Medical History:  Diagnosis Date  . Acute respiratory failure ([place])   . BPH (benign prostatic hyperplasia)   . Carpal tunnel syndrome on both sides   . Chronic diarrhea    intermittant due to chronic collagenous colitis  . 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 Collagenous colitis    chronic--- intermittant between diarrahea and constipation  . ED (erectile dysfunction)   . HCAP (healthcare-associated pneumonia)   . History of adenomatous polyp of colon   . History of Clostridium difficile colitis 05/2016  . History of pneumonia 11/08/2017   CAP -- LLL---  12-19-2017  per pt no residual symptoms  . History of SCC (squamous cell carcinoma) of skin   . History of sepsis    11-20-2017 severe sepsis due to UTI;  10/ 2017sepsis due to c-diff colitis  . Leucocytosis    chronic  . Lower urinary tract symptoms (LUTS)   . Macrocytic anemia    since 02/ 2015  . MGUS (monoclonal gammopathy of unknown significance)    hemotologist-  [name] @ [facility]  . Monoclonal  paraproteinemia   . Neuropathy, peripheral   . OA (osteoarthritis)   . Prostatic stone   . Rash of face    RIGHT SIDE  . Raynaud's syndrome   . Thin skin    fragile  . Transfusion-dependent anemia since 10/ 2017   last transfusion PRBCs 07-19-2017  per hemologist note dated 10-25-2017  . Type 2 diabetes mellitus treated with insulin ([place])    followed by [name] ([facility])  . Urinary hesitancy   . Wears contact lenses    LEFT EYE ONLY    Patient Active Problem List   Diagnosis Date Noted  . Paroxysmal atrial fibrillation ([place]) 04/10/2018  . Aortic atherosclerosis ([place]) 04/08/2018  . Acute lower UTI 02/19/2018  . DM type 2 (diabetes mellitus, type 2) ([place]) 02/19/2018  . Acute respiratory failure with hypoxia ([place]) 02/18/2018  . MGUS (monoclonal gammopathy of unknown significance) 11/23/2017  . BPH (benign prostatic hyperplasia) 11/23/2017  . Left lower lobe pneumonia ([place]) 11/20/2017  . Nausea vomiting and diarrhea 11/20/2017  . Leukemia not having achieved remission ([place]) 12/26/2016  . Anemia 12/26/2016  . Large granular lymphocytic leukemia ([place]) 12/26/2016  . Diabetes mellitus with complication ([place])   . Symptomatic anemia   . DOE (dyspnea on exertion)   . Anxiety   . History of GI bleed   . C. difficile colitis 05/31/2016  . Lactic  acidosis   . History of bone marrow biopsy 05/27/2016  . Fever 05/27/2016  . Encephalopathy in sepsis 05/27/2016  . Chest pain 05/04/2016  . Benign fibroma of prostate 09/20/2015  . HLD (hyperlipidemia) 09/20/2015  . BP (high blood pressure) 09/20/2015  . Arthritis, degenerative 09/20/2015  . Colitis, collagenous 09/09/2015  . Absolute anemia 09/08/2014  . Adenoma of large intestine 07/13/2014  . Disturbance of skin sensation 01/09/2014  . Recurrent major depressive disorder, in full remission ([place]) 07/18/2012  . Allergic rhinitis 08/29/2011    Past Surgical History:  Procedure Laterality Date  . BONE MARROW BIOPSY Right 05-24-2016;   08-20-2014  . CARDIOVASCULAR STRESS TEST  11-26-2012   [name]  @ [place] ([place]   normal nuclear study w/ no ischemia/  normal LV function and wall motion , ef 60% (find in care everywhere,epic)  . CATARACT EXTRACTION W/ INTRAOCULAR LENS  IMPLANT, BILATERAL  2015  . CYSTOSCOPY WITH LITHOLAPAXY N/A 12/25/2017   Procedure: CYSTOSCOPY WITH LITHOLAPAXY AND FULGERATION;  Surgeon: [name], MD;  Location: [facility];  Service: Urology;  Laterality: N/A;  . INGUINAL HERNIA REPAIR Right 1980s  . LOOP RECORDER INSERTION N/A 04/10/2018   Procedure: LOOP RECORDER INSERTION;  Surgeon: [name], MD;  Location: [facility];  Service: Cardiovascular;  Laterality: N/A;  . PENILE PROSTHESIS IMPLANT  12-02-2015   dr [name] @ [facility] in [place]  . SHOULDER SURGERY Left 08/2016  . TONSILLECTOMY  child  . TRANSURETHRAL RESECTION OF PROSTATE  2009   AND REPAIR RECURRENT RIGHT INGUINAL HERNIA        Home Medications    Prior to Admission medications   Medication Sig Start Date End Date Taking? Authorizing Provider  budesonide (ENTOCORT EC) 3 MG 24 hr capsule Take 3 mg by mouth daily.    Yes [provider]  Insulin Glargine-Lixisenatide (SOLIQUA) 100-33 UNT-MCG/ML SOPN Inject 28 Units into the skin at bedtime.    Yes [provider]  methotrexate (RHEUMATREX) 2.5 MG tablet Take 30 mg by mouth every Tuesday.  07/19/17  Yes [provider]  acetaminophen (TYLENOL) 325 MG tablet Take 2 tablets (650 mg total) by mouth every 6 (six) hours as needed for mild pain (or Fever >/= 101). 11/24/17   Elgergawy, Silver Huguenin, MD  Ascorbic Acid (VITAMIN C) 1000 MG tablet Take 1,000 mg by mouth daily.    [provider]  pioglitazone (ACTOS) 15 MG tablet Take 15 mg by mouth every morning.  10/18/17   [provider]  vitamin B-12 (CYANOCOBALAMIN) 500 MCG tablet Take 1 tablet (500 mcg total) by mouth daily. 11/24/17    Elgergawy, Silver Huguenin, MD    Family History Family History  Problem Relation Age of Onset  . Diabetes Mother   . Heart failure Mother     Social History Social History   Tobacco Use  . Smoking status: Former Smoker    Years: 20.00    Types: Cigarettes    Last attempt to quit: 05/04/1996    Years since quitting: 22.2  . Smokeless tobacco: Never Used  Substance Use Topics  . Alcohol use: Yes    Comment: 1-2 glasses of wine every other day  . Drug use: No     Allergies   Bee venom; Metformin and related; and Sulfamethoxazole-trimethoprim   Review of Systems Review of Systems  Constitutional: Positive for chills and fever.  Respiratory: Positive for cough.   Gastrointestinal: Positive  for diarrhea.  All other systems reviewed and are negative.    Physical Exam Updated Vital Signs BP (!) 128/50 (BP Location: Left Arm)   Pulse (!) 123   Temp 99.6 F (37.6 C) (Oral)   Resp 20   Ht 5' 9" (1.753 m)   Wt 78 kg   SpO2 92%   BMI 25.39 kg/m   Physical Exam Vitals signs and nursing note reviewed.  Constitutional:      General: He is in acute distress.     Appearance: Normal appearance. He is normal weight. He is ill-appearing.  HENT:     Head: Normocephalic and atraumatic.     Right Ear: External ear normal.     Nose: Nose normal.     Mouth/Throat:     Mouth: Mucous membranes are dry.  Eyes:     Extraocular Movements: Extraocular movements intact.     Pupils: Pupils are equal, round, and reactive to light.  Neck:     Musculoskeletal: Normal range of motion and neck supple.  Cardiovascular:     Rate and Rhythm: Tachycardia present.     Pulses: Normal pulses.     Heart sounds: Normal heart sounds.  Pulmonary:     Effort: Tachypnea present.     Breath sounds: Rhonchi present.  Abdominal:     General: Abdomen is flat. Bowel sounds are normal.     Palpations: Abdomen is soft.  Musculoskeletal: Normal range of motion.  Skin:    General: Skin is warm and dry.       Capillary Refill: Capillary refill takes less than 2 seconds.  Neurological:     Mental Status: He is alert. He is confused.  Psychiatric:        Mood and Affect: Mood normal.        Behavior: Behavior normal.        Thought Content: Thought content normal.        Judgment: Judgment normal.      ED Treatments / Results  Labs (all labs ordered are listed, but only abnormal results are displayed) Labs Reviewed  COMPREHENSIVE METABOLIC PANEL - Abnormal; Notable for the following components:      Result Value   CO2 20 (*)    Glucose, Bld 344 (*)    Creatinine, Ser 0.59 (*)    Calcium 8.4 (*)    Total Protein 6.1 (*)    All other components within normal limits  CBC WITH DIFFERENTIAL/PLATELET - Abnormal; Notable for the following components:   WBC 15.8 (*)    RBC 2.34 (*)    Hemoglobin 9.0 (*)    HCT 28.5 (*)    MCV 121.8 (*)    MCH 38.5 (*)    RDW 19.9 (*)    Neutro Abs 13.5 (*)    Monocytes Absolute 1.1 (*)    Abs Immature Granulocytes 0.30 (*)    All other components within normal limits  CULTURE, BLOOD (ROUTINE X 2)  CULTURE, BLOOD (ROUTINE X 2)  URINE CULTURE  GASTROINTESTINAL PANEL BY PCR, STOOL (REPLACES STOOL CULTURE)  INFLUENZA PANEL BY PCR (TYPE A & B)  URINALYSIS, ROUTINE W REFLEX MICROSCOPIC  I-STAT CG4 LACTIC ACID, ED  I-STAT CG4 LACTIC ACID, ED    EKG EKG Interpretation  Date/Time:  Sunday July 28 2018 20:12:14 EST Ventricular Rate:  125 PR Interval:    QRS Duration: 92 QT Interval:  315 QTC Calculation: 455 R Axis:   -54 Text Interpretation:  Sinus tachycardia Left anterior fascicular  block Abnormal R-wave progression, late transition Confirmed by Isla Pence [phone]) on 07/28/2018 9:28:30 PM   Radiology Dg Chest 2 View  Result Date: 07/28/2018 CLINICAL DATA:  Fevers with nausea and vomiting EXAM: CHEST - 2 VIEW COMPARISON:  7/15/9 FINDINGS: Cardiac shadow is at the upper limits of normal in size. Aortic calcifications are again  seen. Loop recorder is now noted. The lungs are well aerated bilaterally. Patchy bibasilar infiltrative changes are seen slightly worse on the left than the right. No bony abnormality is noted. IMPRESSION: Bibasilar infiltrates left greater than right. Electronically Signed   By: Inez Catalina M.D.   On: 07/28/2018 21:19    Procedures Procedures (including critical care time)  Medications Ordered in ED Medications  cefTRIAXone (ROCEPHIN) 1 g in sodium chloride 0.9 % 100 mL IVPB (0 g Intravenous Stopped 07/28/18 2117)  azithromycin (ZITHROMAX) 500 mg in sodium chloride 0.9 % 250 mL IVPB (500 mg Intravenous New Bag/Given 07/28/18 2154)  acetaminophen (TYLENOL) tablet 1,000 mg (has no administration in time range)  sodium chloride 0.9 % bolus 1,000 mL (1,000 mLs Intravenous New Bag/Given 07/28/18 2025)    And  sodium chloride 0.9 % bolus 1,000 mL (1,000 mLs Intravenous New Bag/Given 07/28/18 2040)    And  sodium chloride 0.9 % bolus 500 mL (500 mLs Intravenous New Bag/Given 07/28/18 2118)  ondansetron (ZOFRAN) injection 4 mg (4 mg Intravenous Given 07/28/18 2040)  LORazepam (ATIVAN) injection 1 mg (1 mg Intravenous Given 07/28/18 2155)  ibuprofen (ADVIL,MOTRIN) tablet 800 mg (800 mg Oral Given 07/28/18 2218)     Initial Impression / Assessment and Plan / ED Course  I have reviewed the triage vital signs and the nursing notes.  Pertinent labs & imaging results that were available during my care of the patient were reviewed by me and considered in my medical decision making (see chart for details).     Pt did have diarrhea while here, but lab said it was too formed to be cdiff.  They did run the other tests.  Code sepsis was called.  Pt given sepsis fluids.  Fever was treated with tylenol and ibuprofen.  The pt does have bilateral pneumonia.  He was treated with rocephin/zithromax.    O2 sat down to 88%, so I put him on 2L oxygen via [place].  O2 sats mid 90s after that intervention.  Flu was  negative.  Urine pending.  We have a source for infection (CAP) and he was given rocephin which would cover a UTI.    Pt d/w Dr. Harvest Forest ([facility]) who will admit.  CRITICAL CARE Performed by: Isla Pence   Total critical care time: 30 minutes  Critical care time was exclusive of separately billable procedures and treating other patients.  Critical care was necessary to treat or prevent imminent or life-threatening deterioration.  Critical care was time spent personally by me on the following activities: development of treatment plan with patient and/or surrogate as well as nursing, discussions with consultants, evaluation of patient's response to treatment, examination of patient, obtaining history from patient or surrogate, ordering and performing treatments and interventions, ordering and review of laboratory studies, ordering and review of radiographic studies, pulse oximetry and re-evaluation of patient's condition.  Final Clinical Impressions(s) / ED Diagnoses   Final diagnoses:  Community acquired pneumonia, unspecified laterality  Sepsis, due to unspecified organism, unspecified whether acute organ dysfunction present [facility])  Hyperglycemia  Hypoxia    ED Discharge Orders    None  Isla Pence, MD 07/28/18 [id number]    Isla Pence, MD 07/28/18 2220

## 2018-07-28 NOTE — ED Notes (Signed)
Hospitalist at bedside 

## 2018-07-28 NOTE — ED Notes (Signed)
Pt has been placed on the bedside commode 3 times and is currently vomiting and shivering

## 2018-07-28 NOTE — ED Notes (Signed)
Bed: WA06 Expected date:  Expected time:  Means of arrival:  Comments: 24M 104 fever, N/V

## 2018-07-28 NOTE — ED Notes (Signed)
Fiance Hoyle Sauer) [phone] and daughter [phone] (Abbi) would like to be contacted when pt gets admitted and with any updates

## 2018-07-28 NOTE — H&P (Signed)
History and Physical    Tommy Peterson GBT:[id number] DOB: [birth year] DOA: 07/28/2018  Referring MD/NP/PA: Isla Pence, MD PCP: Jani Gravel, MD  Patient coming from: Home via EMS  Chief Complaint: Fever  I have personally briefly reviewed patient's old medical records in [place]   HPI: Tommy Peterson is a 77 y.o. 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  Much of history is obtained from the patient's wife as he is currently somewhat confused. Patient had been restless last night and did not sleep well.  This morning he had awoken with fever reported to be 101 and 104 F at home and has been intermittently disoriented.  Wife reports similar quick onset presentation during previous admissions for pneumonia and UTI in the past.  This is his fifth hospitalization this year.  He has had a nonproductive cough that may have been worse, but family makes it seem more chronic in nature.  Wife notes that he had not been complaining of difficulty urinating or dysuria.  Associated symptoms include chills, suprapubic abdominal tenderness, blood with wiping, nausea, and at least one episode of vomiting.  En route with EMS the patient had reportedly been given 10110m Tylenol prior to arrival.  ED Course: Admission into the emergency department patient was seen to have a temperature of 100.1 F, pulse 116 125, respirations 20-32, blood pressure 99/41-130/61, and O2 saturations 92 to 97% on 2 L of nasal cannula oxygen.  Labs significant for WBC 15.8, hemoglobin near baseline at 9, glucose 344, AG 10, and repeat lactic acid 2.44.  Influenza screen was negative.  Chest x-ray showed bibasilar infiltrates concerning for pneumonia greater on the left than on the right.  Patient sepsis protocol was initiated patient was given a full  fluid bolus, ceftriaxone, and azithromycin.  He also received Ativan for agitation.  Review of Systems  Unable to perform ROS: Mental status change  Constitutional: Positive for chills and fever.  HENT: Negative for congestion.   Eyes: Negative for photophobia and pain.  Respiratory: Positive for cough and shortness of breath. Negative for sputum production.   Cardiovascular: Negative for chest pain and leg swelling.  Gastrointestinal: Positive for abdominal pain, nausea and vomiting.    Past Medical History:  Diagnosis Date  . Acute respiratory failure ([place]   . BPH (benign prostatic hyperplasia)   . Carpal tunnel syndrome on both sides   . Chronic diarrhea    intermittant due to chronic collagenous colitis  . 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 Collagenous colitis    chronic--- intermittant between diarrahea and constipation  . ED (erectile dysfunction)   . HCAP (healthcare-associated pneumonia)   . History of adenomatous polyp of colon   . History of Clostridium difficile colitis 05/2016  . History of pneumonia 11/08/2017   CAP -- [facility]---  12-19-2017  per pt no residual symptoms  . History of SCC (squamous cell carcinoma) of skin   . History of sepsis    11-20-2017 severe sepsis due to UTI;  10/ 2017sepsis due to c-diff colitis  . Leucocytosis    chronic  .  Lower urinary tract symptoms (LUTS)   . Macrocytic anemia    since 02/ 2015  . MGUS (monoclonal gammopathy of unknown significance)    hemotologist-  [name] @ [facility]  . Monoclonal paraproteinemia   . Neuropathy, peripheral   . OA (osteoarthritis)   . Prostatic stone   . Rash of face    RIGHT SIDE  .  Raynaud's syndrome   . Thin skin    fragile  . Transfusion-dependent anemia since 10/ 2017   last transfusion PRBCs 07-19-2017  per hemologist note dated 10-25-2017  . Type 2 diabetes mellitus treated with insulin ([place])    followed by [name] (pcp)  . Urinary hesitancy   . Wears contact lenses    LEFT EYE ONLY    Past Surgical History:  Procedure Laterality Date  . BONE MARROW BIOPSY Right 05-24-2016;  08-20-2014  . CARDIOVASCULAR STRESS TEST  11-26-2012   [name]  @ [place] ([place]   normal nuclear study w/ no ischemia/  normal LV function and wall motion , ef 60% (find in care everywhere,epic)  . CATARACT EXTRACTION W/ INTRAOCULAR LENS  IMPLANT, BILATERAL  2015  . CYSTOSCOPY WITH LITHOLAPAXY N/A 12/25/2017   Procedure: CYSTOSCOPY WITH LITHOLAPAXY AND FULGERATION;  Surgeon: [name], MD;  [facility];  Service: Urology;  Laterality: N/A;  . INGUINAL HERNIA REPAIR Right 1980s  . LOOP RECORDER INSERTION N/A 04/10/2018   Procedure: LOOP RECORDER INSERTION;  Surgeon: [name], MD;  Location: [facility];  Service: Cardiovascular;  Laterality: N/A;  . PENILE PROSTHESIS IMPLANT  12-02-2015   dr [name] @ [facility] in [place]  . SHOULDER SURGERY Left 08/2016  . TONSILLECTOMY  child  . TRANSURETHRAL RESECTION OF PROSTATE  2009   AND REPAIR RECURRENT RIGHT INGUINAL HERNIA     reports that he quit smoking about 22 years ago. His smoking use included cigarettes. He quit after 20.00 years of use. He has never used smokeless tobacco. He reports current alcohol use. He reports that he does not use drugs.  Allergies  Allergen Reactions  . Bee Venom Swelling  . Metformin And Related Nausea And Vomiting  . Sulfamethoxazole-Trimethoprim Nausea And Vomiting    Family History  Problem Relation Age of Onset  . Diabetes Mother   . Heart failure Mother     Prior to Admission medications   Medication Sig Start Date End Date  Taking? Authorizing Provider  budesonide (ENTOCORT EC) 3 MG 24 hr capsule Take 3 mg by mouth daily.    Yes [provider]  Insulin Glargine-Lixisenatide (SOLIQUA) 100-33 UNT-MCG/ML SOPN Inject 28 Units into the skin at bedtime.    Yes [provider]  methotrexate (RHEUMATREX) 2.5 MG tablet Take 30 mg by mouth every Tuesday.  07/19/17  Yes [provider]  acetaminophen (TYLENOL) 325 MG tablet Take 2 tablets (650 mg total) by mouth every 6 (six) hours as needed for mild pain (or Fever >/= 101). 11/24/17   Elgergawy, Silver Huguenin, MD  Ascorbic Acid (VITAMIN C) 1000 MG tablet Take 1,000 mg by mouth daily.    [provider]  pioglitazone (ACTOS) 15 MG tablet Take 15 mg by mouth every morning.  10/18/17   [provider]  vitamin B-12 (CYANOCOBALAMIN) 500 MCG tablet Take 1 tablet (500 mcg total) by mouth daily. 11/24/17   Elgergawy, Silver Huguenin, MD    Physical Exam:  Constitutional: Elderly male who appears sick, restless, and  uncomfortable Vitals:   07/28/18 1955 07/28/18 2013 07/28/18 2200 07/28/18 2206  BP:  130/61 (!) 128/50 (!) 128/50  Pulse:  (!) 125 (!) 123 (!) 123  Resp:  (!) 24 (!) 22 20  Temp:  100.1 F (37.8 C)  99.6 F (37.6 C)  TempSrc:  Oral  Oral  SpO2:  92% 92% 92%  Weight: 78 kg     Height: _0  (1.753 m)      Eyes: PERRL, lids and conjunctivae normal ENMT: Mucous membranes are dry posterior pharynx clear of any exudate or lesions.  Neck: normal, supple, no masses, no thyromegaly Respiratory: Tachypneic with some rhonchi appreciated with deep inspiration of the lower left lung field.  No significant wheezes.  Patient on 2 L nasal cannula oxygen maintaining O2 sats at this time. Cardiovascular: Tachycardic, no murmurs / rubs / gallops. No extremity edema. 2+ pedal pulses. No carotid bruits.  Abdomen: Suprapubic tenderness present, no masses palpated. No hepatosplenomegaly. Bowel sounds positive.  Musculoskeletal: no clubbing /  cyanosis. No joint deformity upper and lower extremities. Good ROM, no contractures. Normal muscle tone.  Skin: no rashes, lesions, ulcers. No induration Neurologic: CN 2-12 grossly intact. Sensation intact, DTR normal. Strength 5/5 in all 4.  Psychiatric:  Mild confusion, but oriented x 3.  Restless mood.     Labs on Admission: I have personally reviewed following labs and imaging studies  CBC: Recent Labs  Lab 07/28/18 2021  WBC 15.8*  NEUTROABS 13.5*  HGB 9.0*  HCT 28.5*  MCV 121.8*  PLT 916   Basic Metabolic Panel: Recent Labs  Lab 07/28/18 2021  NA 136  K 4.2  CL 106  CO2 20*  GLUCOSE 344*  BUN 14  CREATININE 0.59*  CALCIUM 8.4*   GFR: Estimated Creatinine Clearance: 76.1 mL/min (A) (by C-G formula based on SCr of 0.59 mg/dL (L)). Liver Function Tests: Recent Labs  Lab 07/28/18 2021  AST 21  ALT 21  ALKPHOS 68  BILITOT 1.2  PROT 6.1*  ALBUMIN 3.5   No results for input(s): LIPASE, AMYLASE in the last 168 hours. No results for input(s): AMMONIA in the last 168 hours. Coagulation Profile: No results for input(s): INR, PROTIME in the last 168 hours. Cardiac Enzymes: No results for input(s): CKTOTAL, CKMB, CKMBINDEX, TROPONINI in the last 168 hours. BNP (last 3 results) No results for input(s): PROBNP in the last 8760 hours. HbA1C: No results for input(s): HGBA1C in the last 72 hours. CBG: No results for input(s): GLUCAP in the last 168 hours. Lipid Profile: No results for input(s): CHOL, HDL, LDLCALC, TRIG, CHOLHDL, LDLDIRECT in the last 72 hours. Thyroid Function Tests: No results for input(s): TSH, T4TOTAL, FREET4, T3FREE, THYROIDAB in the last 72 hours. Anemia Panel: No results for input(s): VITAMINB12, FOLATE, FERRITIN, TIBC, IRON, RETICCTPCT in the last 72 hours. Urine analysis:    Component Value Date/Time   COLORURINE AMBER (A) 02/18/2018 1249   APPEARANCEUR CLOUDY (A) 02/18/2018 1249   LABSPEC 1.031 (H) 02/18/2018 1249   PHURINE 5.0  02/18/2018 1249   GLUCOSEU NEGATIVE 02/18/2018 1249   HGBUR NEGATIVE 02/18/2018 1249   BILIRUBINUR NEGATIVE 02/18/2018 1249   BILIRUBINUR negative 10/29/2017 1428   KETONESUR NEGATIVE 02/18/2018 1249   PROTEINUR 30 (A) 02/18/2018 1249   UROBILINOGEN 0.2 10/29/2017 1428   NITRITE POSITIVE (A) 02/18/2018 1249   LEUKOCYTESUR LARGE (A) 02/18/2018 1249   Sepsis Labs: No results found for this or any previous visit (from the past 240 hour(s)).   Radiological Exams  on Admission: Dg Chest 2 View  Result Date: 07/28/2018 CLINICAL DATA:  Fevers with nausea and vomiting EXAM: CHEST - 2 VIEW COMPARISON:  7/15/9 FINDINGS: Cardiac shadow is at the upper limits of normal in size. Aortic calcifications are again seen. Loop recorder is now noted. The lungs are well aerated bilaterally. Patchy bibasilar infiltrative changes are seen slightly worse on the left than the right. No bony abnormality is noted. IMPRESSION: Bibasilar infiltrates left greater than right. Electronically Signed   By: Inez Catalina M.D.   On: 07/28/2018 21:19    EKG: Independently reviewed.  Sinus tachycardia 125 bpm  Assessment/Plan Sepsis secondary to pneumonia: Patient presents with reports of fever at home of 101- 104 F, tachycardic, tachypneic, and nonproductive cough.  WBC was 15.8 and repeat lactic acid elevated at 2.24.  Chest x-ray showed bibasilar infiltrates greater on the left than on the right side. Influenza screen negative.  Treating as for community-acquired pneumonia at this time.  Other possible source of infection could include a urinary tract infection given patient history. - Admit to telemetry bed 15.8 with - Follow-up blood, sputum, and urine cultures and studies - Check respiratory virus panel - Continue empiric antibiotics of ceftriaxone and azithromycin -Trend lactic acid levels - Mucinex  Respiratory failure with hypoxia: O2 sat is noted to be as low as 88% for which he was placed on 2 L of nasal cannula  oxygen. - Continuous pulse oximetry with nasal cannula oxygen as tolerated to maintain O2 sats greater than 92% - Albuterol breathing treatments as needed for shortness of breath/wheezing - Incentive spirometry  Encephalopathy: Suspect related to above infection and respiratory failure.  No neck rigidity noted on physical exam. Patient does answer questions appropriately, but may need redirection at times. - Neurochecks  Diabetes mellitus type 2 with hyperglycemia:  Initial blood glucose elevated up to 344 with CO2 20, and anion gap within normal limits.  Last hemoglobin A1c noted to be 8.3 on 10/2017 on care everywhere records.  - Hypoglycemic protocols - Hold actos - Continue glargine-lixisenatide  - CBGs q. before meals with sensitive SSI  Macrocytic anemia/history of multiple transfusions: Chronic. Patient presents with a hemoglobin of 9 with elevated MCV and MCH. His baseline hemoglobin appears to range from 8-10.  He requires transfusions intermittently when hemoglobin dropped less than 7.  He has seen blood with wiping, but does not reported in stool. - Check CBC, Folate, VIt b12 in a.m.  Chronic large granular lymphocytic leukemia on chronic immunosuppressive therapy: Patient currently on treatments of methotrexate which he takes every Tuesday.  He is [facility] by [facility] at [facility]. - Continue methotrexate  History of chronic constipation/diarrhea and colitis:.  Patient with history of colitis, - Continue  Budesonide  Proximal atrial fibrillation: Patient was diagnosed with proximal atrial fibrillation during his last hospitalization in 02/2018 thought to be related to pneumonia.  Based off his CHA2DS2-VASc score = 4 he was recommended to start on metoprolol and Eliquis.  It appears these prescriptions were never filled.  He followed up with Dr.Croitoru of cardiology who had placed loop recorder.  Recorder was noted to have at least one episode of atrial fibrillation noted on 06/30/18.   Due to patient with history of anemia it appears that it was decided for the patient not to be started on anticoagulation.  - Continue to monitor   BPH: Patient not on any medications for treatment.  Denies any symptoms. - Check bladder scan to make sure patient is not  acutely retaining and will place Foley catheter if needed  DVT prophylaxis: lovenox Code Status: Full Family Communication: Discussed plan of care with the patient family present at bedside Disposition Plan: Possibly discharge home in 2 to 3 days if clinically improving Consults called: None   Admission status: Inpatient  Norval Morton MD [facility] Pager [phone]   If 7PM-7AM, please contact night-coverage [email or web address] Password [facility]  07/28/2018, 10:16 PM

## 2018-07-28 NOTE — ED Notes (Addendum)
Pt aware that urine is needed. Unable to produce at this time. Urinal at bedside. Produce solid stool followed by liquid. Sample sent to lab

## 2018-07-28 NOTE — ED Notes (Signed)
ED TO INPATIENT HANDOFF REPORT  Name/Age/Gender Tommy Peterson 77 y.o. male  Code Status    Code Status Orders  (From admission, onward)         Start     Ordered   07/28/18 2308  Full code  Continuous     07/28/18 2309        Code Status History    Date Active Date Inactive Code Status Order ID Comments User Context   11/20/2017 0625 11/24/2017 1827 Full Code [id number]  Etta Quill, DO ED   12/26/2016 1700 12/27/2016 2028 Full Code [id number]  Dionne Milo, NP Inpatient   05/27/2016 2320 06/01/2016 1832 Full Code [id number]  Phillips Grout, MD Inpatient   05/04/2016 2241 05/06/2016 1242 Full Code [id number]  Jani Gravel, MD Inpatient      Home/SNF/Other Home  Chief Complaint Fever/Nausea-Emesis  Level of Care/Admitting Diagnosis ED Disposition    ED Disposition Condition [facility] Area: [facility] [[id number]]  Level of Care: Telemetry [5]  Admit to tele based on following criteria: Complex arrhythmia (Bradycardia/Tachycardia)  Diagnosis: Sepsis [facility]) [[id number]]  Admitting Physician: Norval Morton [[id number]]  Attending Physician: Norval Morton [[id number]]  Estimated length of stay: past midnight tomorrow  Certification:: I certify this patient will need inpatient services for at least 2 midnights  PT Class (Do Not Modify): Inpatient [101]  PT Acc Code (Do Not Modify): Private [1]       Medical History Past Medical History:  Diagnosis Date  . Acute respiratory failure ([place])   . BPH (benign prostatic hyperplasia)   . Carpal tunnel syndrome on both sides   . Chronic diarrhea    intermittant due to chronic collagenous colitis  . Chronic large granular lymphocytic leukemia ([place]) primary hemotologist-  [name]. 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 Collagenous colitis    chronic--- intermittant between diarrahea and constipation  . ED (erectile dysfunction)   . HCAP (healthcare-associated pneumonia)   . History of adenomatous polyp of colon   . History of Clostridium difficile colitis 05/2016  . History of pneumonia 11/08/2017   CAP -- LLL---  12-19-2017  per pt no residual symptoms  . History of SCC (squamous cell carcinoma) of skin   . History of sepsis    11-20-2017 severe sepsis due to UTI;  10/ 2017sepsis due to c-diff colitis  . Leucocytosis    chronic  . Lower urinary tract symptoms (LUTS)   . Macrocytic anemia    since 02/ 2015  . MGUS (monoclonal gammopathy of unknown significance)    hemotologist-  [name] @ [facility]  . Monoclonal paraproteinemia   . Neuropathy, peripheral   . OA (osteoarthritis)   . Prostatic stone   . Rash of face    RIGHT SIDE  . Raynaud's syndrome   . Thin skin    fragile  . Transfusion-dependent anemia since 10/ 2017   last transfusion PRBCs 07-19-2017  per hemologist note dated 10-25-2017  . Type 2 diabetes mellitus treated with insulin ([place])    followed by [name] (pcp)  . Urinary hesitancy   . Wears contact lenses    LEFT EYE ONLY    Allergies Allergies  Allergen Reactions  . Bee Venom Swelling  .  Metformin And Related Nausea And Vomiting  . Sulfamethoxazole-Trimethoprim Nausea And Vomiting    IV Location/Drains/Wounds Patient Lines/Drains/Airways Status   Active Line/Drains/Airways    Name:   Placement date:   Placement time:   Site:   Days:   Peripheral IV 07/28/18 Right Hand   07/28/18    2025    Hand   less than 1   Peripheral IV 07/28/18 Left Hand   07/28/18    2005    Hand   less than 1          Labs/Imaging Results for orders placed or performed during the hospital encounter of 07/28/18 (from the past 48 hour(s))  Comprehensive metabolic panel     Status: Abnormal   Collection Time: 07/28/18  8:21 PM   Result Value Ref Range   Sodium 136 135 - 145 mmol/L   Potassium 4.2 3.5 - 5.1 mmol/L   Chloride 106 98 - 111 mmol/L   CO2 20 (L) 22 - 32 mmol/L   Glucose, Bld 344 (H) 70 - 99 mg/dL   BUN 14 8 - 23 mg/dL   Creatinine, Ser 0.59 (L) 0.61 - 1.24 mg/dL   Calcium 8.4 (L) 8.9 - 10.3 mg/dL   Total Protein 6.1 (L) 6.5 - 8.1 g/dL   Albumin 3.5 3.5 - 5.0 g/dL   AST 21 15 - 41 U/L   ALT 21 0 - 44 U/L   Alkaline Phosphatase 68 38 - 126 U/L   Total Bilirubin 1.2 0.3 - 1.2 mg/dL   GFR calc non Af Amer >60 >60 mL/min   GFR calc Af Amer >60 >60 mL/min   Anion gap 10 5 - 15    Comment: Performed at [facility], [place] [address] [place], [place] [address]  CBC WITH DIFFERENTIAL     Status: Abnormal   Collection Time: 07/28/18  8:21 PM  Result Value Ref Range   WBC 15.8 (H) 4.0 - 10.5 K/uL   RBC 2.34 (L) 4.22 - 5.81 MIL/uL   Hemoglobin 9.0 (L) 13.0 - 17.0 g/dL   HCT 28.5 (L) 39.0 - 52.0 %   MCV 121.8 (H) 80.0 - 100.0 fL   MCH 38.5 (H) 26.0 - 34.0 pg   MCHC 31.6 30.0 - 36.0 g/dL   RDW 19.9 (H) 11.5 - 15.5 %   Platelets 360 150 - 400 K/uL   nRBC 0.0 0.0 - 0.2 %   Neutrophils Relative % 85 %   Neutro Abs 13.5 (H) 1.7 - 7.7 K/uL   Lymphocytes Relative 6 %   Lymphs Abs 0.9 0.7 - 4.0 K/uL   Monocytes Relative 7 %   Monocytes Absolute 1.1 (H) 0.1 - 1.0 K/uL   Eosinophils Relative 0 %   Eosinophils Absolute 0.0 0.0 - 0.5 K/uL   Basophils Relative 0 %   Basophils Absolute 0.0 0.0 - 0.1 K/uL   Immature Granulocytes 2 %   Abs Immature Granulocytes 0.30 (H) 0.00 - 0.07 K/uL   Polychromasia PRESENT    Ovalocytes PRESENT     Comment: Performed at [facility], [place] [address], [place], [place] [address]  Influenza panel by PCR (type A & B)     Status: None   Collection Time: 07/28/18  8:22 PM  Result Value Ref Range   Influenza A By PCR NEGATIVE NEGATIVE   Influenza B By PCR NEGATIVE NEGATIVE    Comment: (NOTE) The Xpert Xpress Flu assay is intended as an  aid in the diagnosis  of  influenza and should not be used as a sole basis for treatment.  This  assay is FDA approved for nasopharyngeal swab specimens only. Nasal  washings and aspirates are unacceptable for Xpert Xpress Flu testing. Performed at [facility], [place] [address], [place], [place] [address]   I-Stat CG4 Lactic Acid, ED     Status: None   Collection Time: 07/28/18  8:23 PM  Result Value Ref Range   Lactic Acid, Venous 1.66 0.5 - 1.9 mmol/L  I-Stat CG4 Lactic Acid, ED     Status: Abnormal   Collection Time: 07/28/18 10:21 PM  Result Value Ref Range   Lactic Acid, Venous 2.24 (HH) 0.5 - 1.9 mmol/L   Comment NOTIFIED PHYSICIAN    Dg Chest 2 View  Result Date: 07/28/2018 CLINICAL DATA:  Fevers with nausea and vomiting EXAM: CHEST - 2 VIEW COMPARISON:  7/15/9 FINDINGS: Cardiac shadow is at the upper limits of normal in size. Aortic calcifications are again seen. Loop recorder is now noted. The lungs are well aerated bilaterally. Patchy bibasilar infiltrative changes are seen slightly worse on the left than the right. No bony abnormality is noted. IMPRESSION: Bibasilar infiltrates left greater than right. Electronically Signed   By: Inez Catalina M.D.   On: 07/28/2018 21:19    Pending Labs Unresulted Labs (From admission, onward)    Start     Ordered   07/29/18 0500  CBC  Tomorrow morning,   R     07/28/18 2309   07/29/18 0350  Basic metabolic panel  Tomorrow morning,   R     07/28/18 2309   07/29/18 0200  Lactic acid, plasma  STAT Now then every 3 hours,   STAT     07/28/18 2312   07/28/18 2312  Culture, sputum-assessment  Once,   R     07/28/18 2312   07/28/18 2312  Gram stain  Once,   R     07/28/18 2312   07/28/18 2312  Strep pneumoniae urinary antigen  Once,   R     07/28/18 2312   07/28/18 2312  Legionella Pneumophila Serogp 1 Ur Ag  Once,   R     07/28/18 2312   07/28/18 2312  Respiratory Panel by PCR  (Respiratory virus panel with precautions)   Once,   R     07/28/18 2312   07/28/18 2311  Procalcitonin  Add-on,   R     07/28/18 2312   07/28/18 2000  Blood Culture (routine x 2)  BLOOD CULTURE X 2,   STAT     07/28/18 1959   07/28/18 2000  Urinalysis, Routine w reflex microscopic  ONCE - STAT,   STAT     07/28/18 1959   07/28/18 2000  Urine culture  ONCE - STAT,   STAT     07/28/18 1959   07/28/18 2000  Gastrointestinal Panel by PCR , Stool  (Gastrointestinal Panel by PCR, Stool)  Once,   R     07/28/18 2000          Vitals/Pain Today's Vitals   07/28/18 2200 07/28/18 2206 07/28/18 2230 07/28/18 2300  BP: (!) 128/50 (!) 128/50 (!) 118/46 (!) 99/41  Pulse: (!) 123 (!) 123 (!) 122 (!) 116  Resp: (!) 22 20 (!) 32 (!) 23  Temp:  99.6 F (37.6 C)    TempSrc:  Oral    SpO2: 92% 92% 92% 92%  Weight:      Height:  Isolation Precautions Droplet precaution  Medications Medications  cefTRIAXone (ROCEPHIN) 1 g in sodium chloride 0.9 % 100 mL IVPB (0 g Intravenous Stopped 07/28/18 2117)  acetaminophen (TYLENOL) tablet 1,000 mg (has no administration in time range)  aspirin EC tablet 650 mg (has no administration in time range)  methotrexate (RHEUMATREX) tablet 30 mg (has no administration in time range)  budesonide (ENTOCORT EC) 24 hr capsule 3 mg (has no administration in time range)  Insulin Glargine-Lixisenatide 100-33 UNT-MCG/ML SOPN 28 Units (has no administration in time range)  enoxaparin (LOVENOX) injection 40 mg (has no administration in time range)  sodium chloride flush (NS) 0.9 % injection 3 mL (has no administration in time range)  0.9 %  sodium chloride infusion (has no administration in time range)  acetaminophen (TYLENOL) tablet 650 mg (has no administration in time range)    Or  acetaminophen (TYLENOL) suppository 650 mg (has no administration in time range)  ondansetron (ZOFRAN) tablet 4 mg (has no administration in time range)    Or  ondansetron (ZOFRAN) injection 4 mg (has no administration in  time range)  ipratropium-albuterol (DUONEB) 0.5-2.5 (3) MG/3ML nebulizer solution 3 mL (has no administration in time range)  guaiFENesin (MUCINEX) 12 hr tablet 600 mg (has no administration in time range)  sodium chloride 0.9 % bolus 1,000 mL (0 mLs Intravenous Stopped 07/28/18 2245)    And  sodium chloride 0.9 % bolus 1,000 mL (0 mLs Intravenous Stopped 07/28/18 2256)    And  sodium chloride 0.9 % bolus 500 mL (0 mLs Intravenous Stopped 07/28/18 2245)  ondansetron (ZOFRAN) injection 4 mg (4 mg Intravenous Given 07/28/18 2040)  azithromycin (ZITHROMAX) 500 mg in sodium chloride 0.9 % 250 mL IVPB (500 mg Intravenous New Bag/Given 07/28/18 2154)  LORazepam (ATIVAN) injection 1 mg (1 mg Intravenous Given 07/28/18 2155)  ibuprofen (ADVIL,MOTRIN) tablet 800 mg (800 mg Oral Given 07/28/18 2218)    Mobility walks with two- person assist (very weak)

## 2018-07-28 NOTE — ED Notes (Addendum)
Pt placed on bedside commode as soon as he arrived with EMS. Will begin obtaining blood cultures and vitals when he is done

## 2018-07-28 NOTE — ED Notes (Signed)
Patient transported to X-ray 

## 2018-07-28 NOTE — ED Notes (Signed)
Lab called and they are not able to run the c.diff sample because the stool is too solid. They state that they will run the rest of the stool tests.

## 2018-07-28 NOTE — ED Triage Notes (Signed)
Per ems: pt coming from home c/o fever of 104 F with N/V/D, body aches, chills, and  nonproductive cough. Lungs clear. HX of UTIs leading to septic shock x2. Intermittent AMS.   Pt has leukemia and undergoing treatment (not chemo).    250 mL NS given by EMS

## 2018-07-29 ENCOUNTER — Encounter (HOSPITAL_COMMUNITY): Payer: Self-pay

## 2018-07-29 ENCOUNTER — Inpatient Hospital Stay (HOSPITAL_COMMUNITY): Payer: Medicare HMO

## 2018-07-29 DIAGNOSIS — J9621 Acute and chronic respiratory failure with hypoxia: Secondary | ICD-10-CM

## 2018-07-29 DIAGNOSIS — R739 Hyperglycemia, unspecified: Secondary | ICD-10-CM

## 2018-07-29 LAB — CBC
HEMATOCRIT: 24.2 % — AB (ref 39.0–52.0)
Hemoglobin: 7.5 g/dL — ABNORMAL LOW (ref 13.0–17.0)
MCH: 39.1 pg — ABNORMAL HIGH (ref 26.0–34.0)
MCHC: 31 g/dL (ref 30.0–36.0)
MCV: 126 fL — ABNORMAL HIGH (ref 80.0–100.0)
Platelets: 283 10*3/uL (ref 150–400)
RBC: 1.92 MIL/uL — ABNORMAL LOW (ref 4.22–5.81)
RDW: 20.7 % — ABNORMAL HIGH (ref 11.5–15.5)
WBC: 32.3 10*3/uL — ABNORMAL HIGH (ref 4.0–10.5)
nRBC: 0.1 % (ref 0.0–0.2)

## 2018-07-29 LAB — RESPIRATORY PANEL BY PCR
Adenovirus: NOT DETECTED
Bordetella pertussis: NOT DETECTED
Chlamydophila pneumoniae: NOT DETECTED
Coronavirus 229E: NOT DETECTED
Coronavirus HKU1: NOT DETECTED
Coronavirus NL63: NOT DETECTED
Coronavirus OC43: NOT DETECTED
Influenza A: NOT DETECTED
Influenza B: NOT DETECTED
Metapneumovirus: NOT DETECTED
Mycoplasma pneumoniae: NOT DETECTED
PARAINFLUENZA VIRUS 3-RVPPCR: NOT DETECTED
Parainfluenza Virus 1: NOT DETECTED
Parainfluenza Virus 2: NOT DETECTED
Parainfluenza Virus 4: NOT DETECTED
RHINOVIRUS / ENTEROVIRUS - RVPPCR: NOT DETECTED
Respiratory Syncytial Virus: NOT DETECTED

## 2018-07-29 LAB — GLUCOSE, CAPILLARY
Glucose-Capillary: 215 mg/dL — ABNORMAL HIGH (ref 70–99)
Glucose-Capillary: 95 mg/dL (ref 70–99)
Glucose-Capillary: 113 mg/dL — ABNORMAL HIGH (ref 70–99)
Glucose-Capillary: 162 mg/dL — ABNORMAL HIGH (ref 70–99)

## 2018-07-29 LAB — URINALYSIS, ROUTINE W REFLEX MICROSCOPIC
Bilirubin Urine: NEGATIVE
Glucose, UA: 50 mg/dL — AB
Hgb urine dipstick: NEGATIVE
KETONES UR: NEGATIVE mg/dL
Leukocytes, UA: NEGATIVE
Nitrite: NEGATIVE
Protein, ur: NEGATIVE mg/dL
Specific Gravity, Urine: 1.015 (ref 1.005–1.030)
pH: 5 (ref 5.0–8.0)

## 2018-07-29 LAB — BASIC METABOLIC PANEL
Anion gap: 8 (ref 5–15)
BUN: 16 mg/dL (ref 8–23)
CO2: 19 mmol/L — AB (ref 22–32)
Calcium: 7.5 mg/dL — ABNORMAL LOW (ref 8.9–10.3)
Chloride: 109 mmol/L (ref 98–111)
Creatinine, Ser: 0.78 mg/dL (ref 0.61–1.24)
GFR calc Af Amer: >60 mL/min (ref >60)
GFR calc non Af Amer: >60 mL/min (ref >60)
GLUCOSE: 283 mg/dL — AB (ref 70–99)
Potassium: 2.8 mmol/L — ABNORMAL LOW (ref 3.5–5.1)
Sodium: 136 mmol/L (ref 135–145)

## 2018-07-29 LAB — GASTROINTESTINAL PANEL BY PCR, STOOL (REPLACES STOOL CULTURE)

## 2018-07-29 LAB — LACTIC ACID, PLASMA
Lactic Acid, Venous: 1.5 mmol/L (ref 0.5–1.9)
Lactic Acid, Venous: 1.3 mmol/L (ref 0.5–1.9)

## 2018-07-29 LAB — VITAMIN B12: Vitamin B-12: 374 pg/mL (ref 180–914)

## 2018-07-29 IMAGING — CT CT CHEST W/O CM
2 of 4 series · 15 of 36 positions shown, 18 images · non-contrast
Comparison: Chest radiograph [DATE]. Chest radiograph
[DATE], CT abdomen pelvis [DATE] and CT chest [DATE].

CLINICAL DATA: CLL, fever, tachycardia, leukocytosis, abnormal
chest radiograph.

EXAM:
CT CHEST WITHOUT CONTRAST
TECHNIQUE: Multidetector CT imaging of the chest was performed following the
standard protocol without IV contrast.

[Series 2: thorax · axial · 0.75mm/px · z∈[-356,-66]mm · 12 of 165 slices shown, 15 images]
[im 10/165  mediastinal]
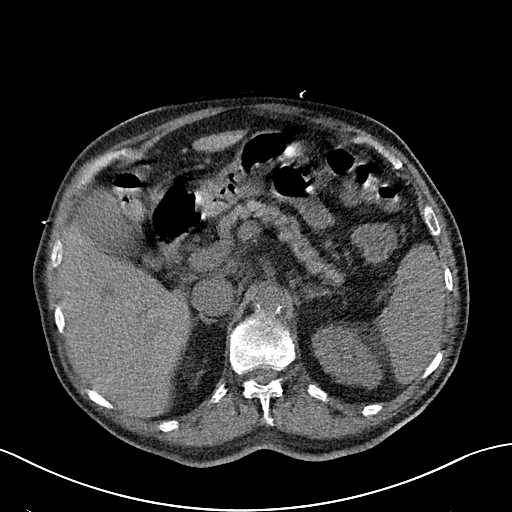
[im 10/165  lung]
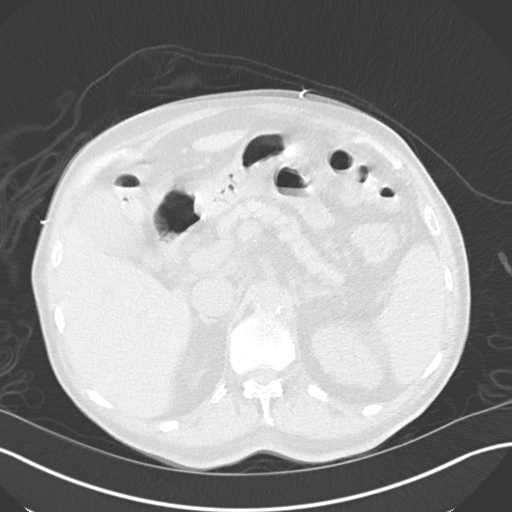
[im 28/165  lung]
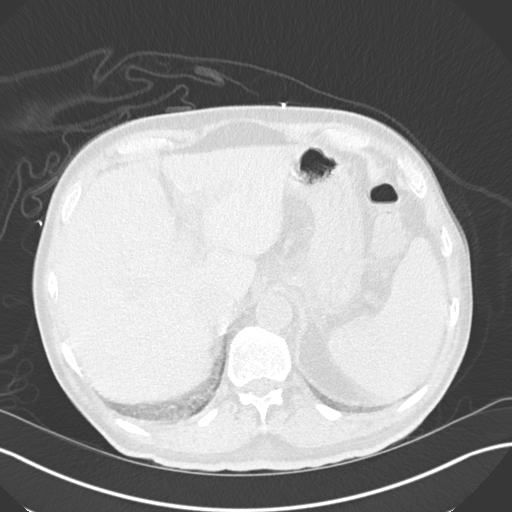
[im 37/165  lung]
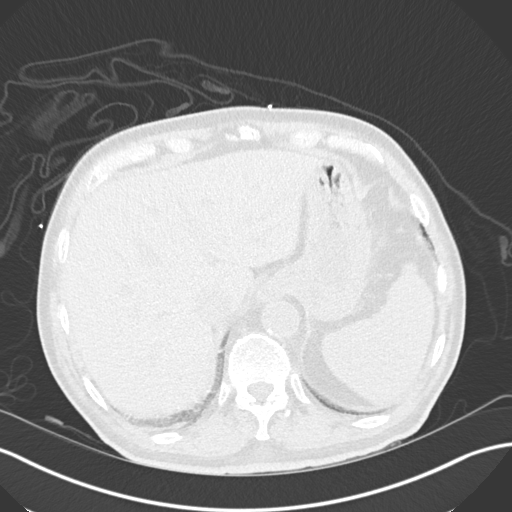
[im 46/165  lung]
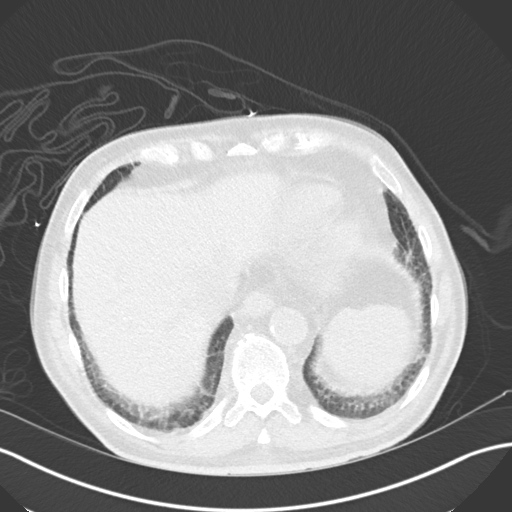
[im 64/165  mediastinal]
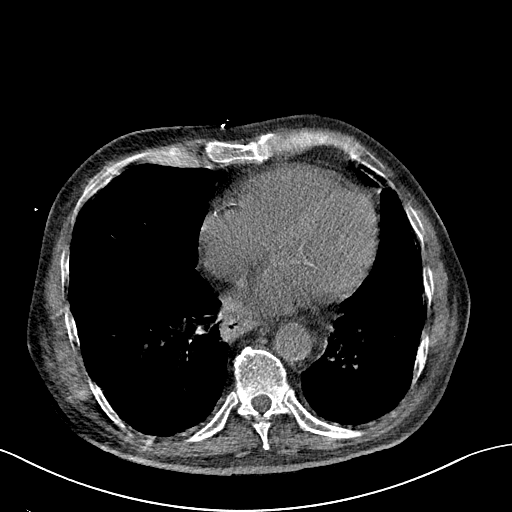
[im 64/165  lung]
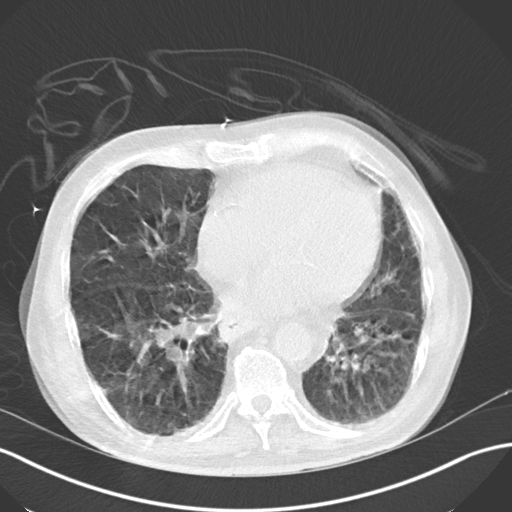
[im 73/165  lung]
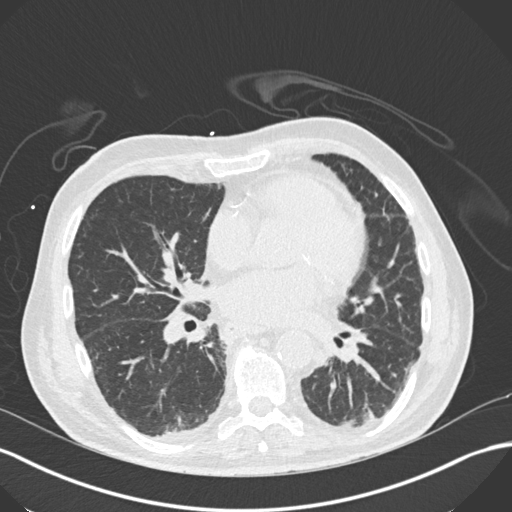
[im 92/165  lung]
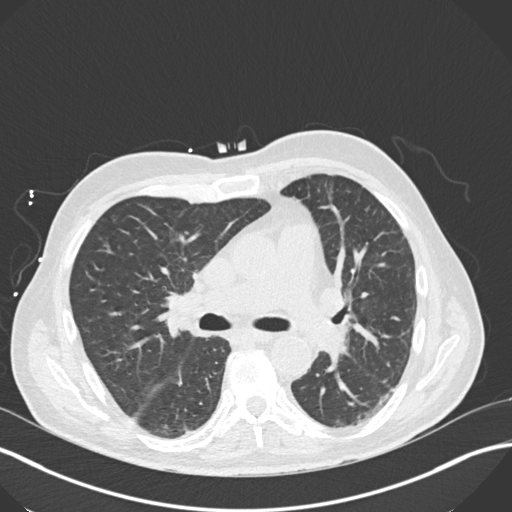
[im 101/165  lung]
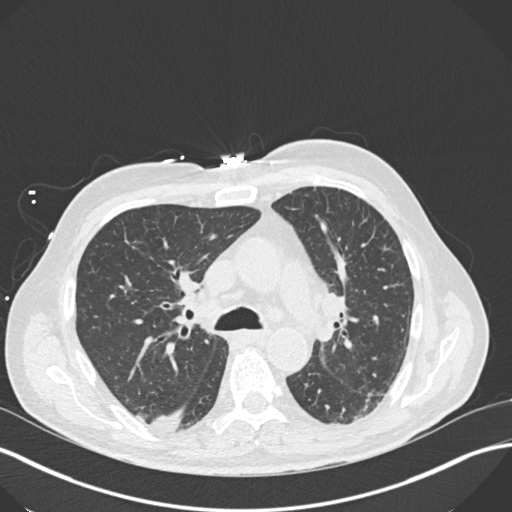
[im 119/165  mediastinal]
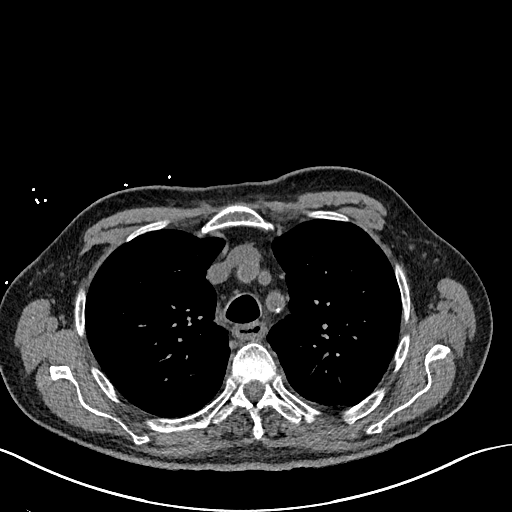
[im 119/165  lung]
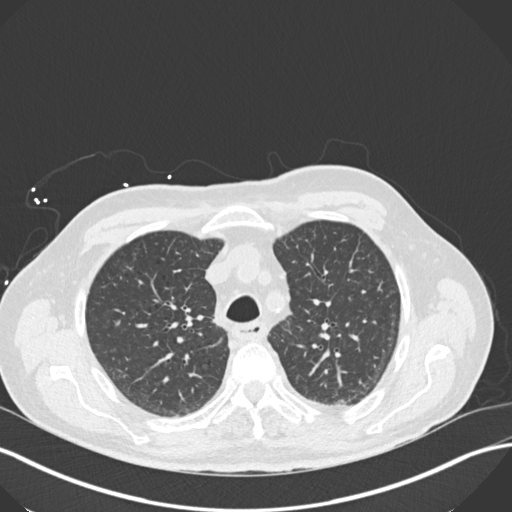
[im 128/165  lung]
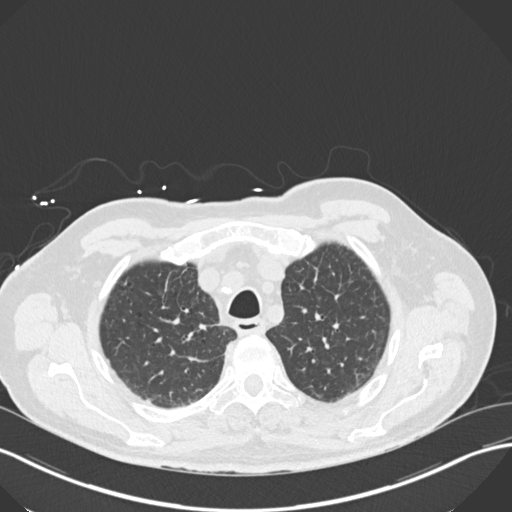
[im 137/165  lung]
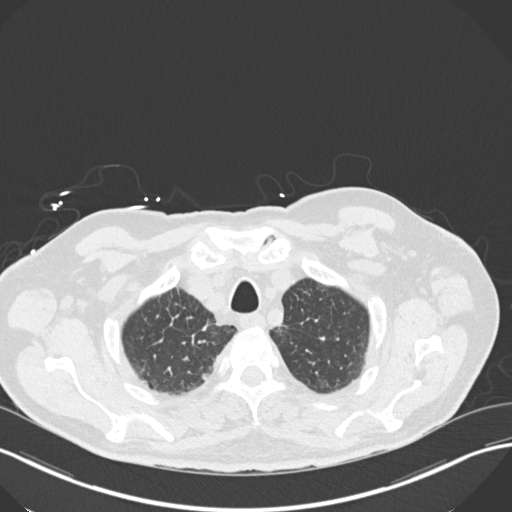
[im 155/165  lung]
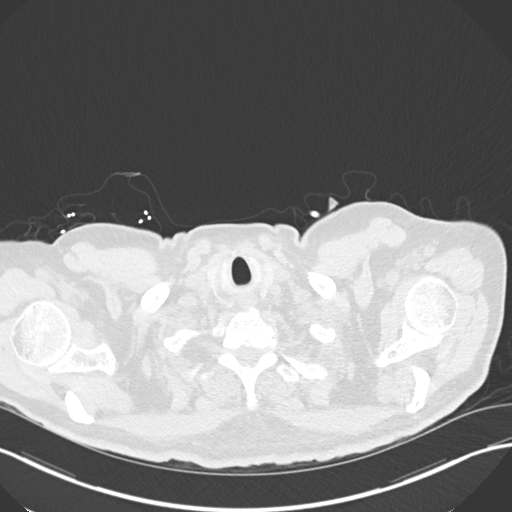

[Series 6: coronal · coronal · 0.64mm/px · 3 of 143 slices shown]
[im 29/143  lung]
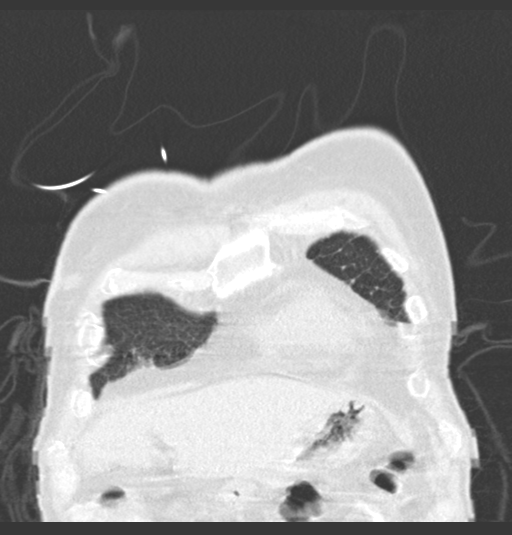
[im 57/143  lung]
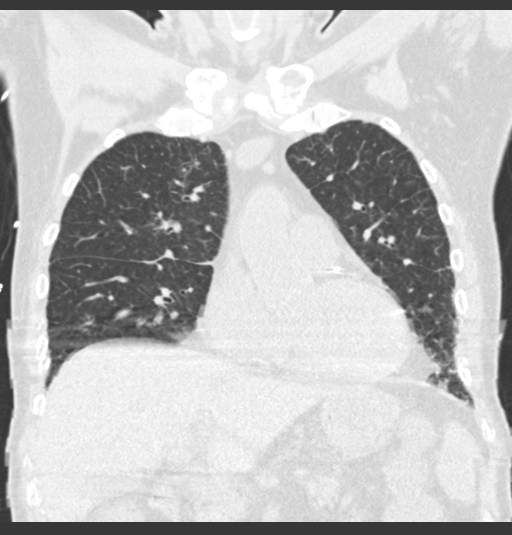
[im 86/143  lung]
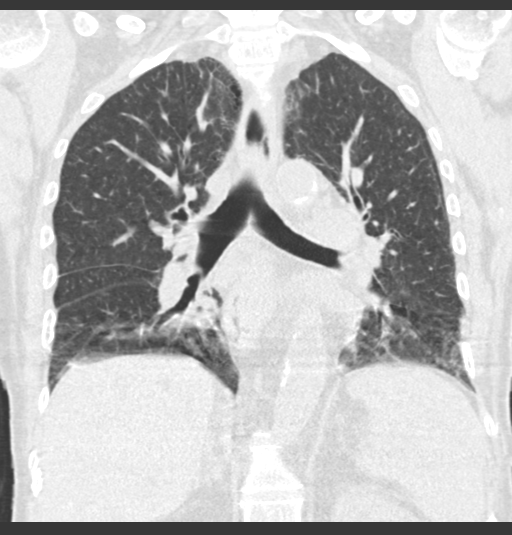

[15 of 36 positions shown; findings below may reference images not displayed]

FINDINGS: Cardiovascular: Atherosclerotic calcification of the aorta and
coronary arteries. Heart is enlarged. No pericardial effusion.

Mediastinum/Nodes: Fat density lesion is seen in association with
the right lobe of the thyroid, as before. Mediastinal lymph nodes
are not enlarged by CT size criteria. Hilar regions are difficult to
evaluate without IV contrast. There does appear to be a 1.2 cm right
hilar lymph node. No axillary adenopathy. Esophagus is grossly
unremarkable.

Lungs/Pleura: Biapical pleuroparenchymal scarring. Centrilobular and
paraseptal mild dependent atelectasis and septal thickening.
Nonspecific subpleural consolidation in the inferior right middle
lobe (series 11, image 39), similar to the prior exam. No airspace
consolidation. Tiny right pleural effusion. Airway is unremarkable.

Upper Abdomen: Subcentimeter low-attenuation lesions in the liver
are too small to characterize. Visualized portions of the liver,
gallbladder, adrenal glands, left kidney, spleen, pancreas, stomach
and bowel are grossly unremarkable.

Musculoskeletal: Degenerative changes in the spine. No worrisome
lytic or sclerotic lesions.
IMPRESSION: 1. Question mild congestive heart failure. No consolidation to
indicate pneumonia.
2. Aortic atherosclerosis ([0M]-170.0). Coronary artery
calcification.

## 2018-07-29 MED ORDER — SODIUM CHLORIDE 0.9 % IV BOLUS
1000.0000 mL | Freq: Once | INTRAVENOUS | Status: AC
  Administered 2018-07-29: 1000 mL via INTRAVENOUS

## 2018-07-29 MED ORDER — DOCUSATE SODIUM 100 MG PO CAPS: 100.0000 mg | ORAL_CAPSULE | Freq: Every day | ORAL | Status: DC | PRN

## 2018-07-29 MED ORDER — POTASSIUM CHLORIDE CRYS ER 20 MEQ PO TBCR
40.0000 meq | EXTENDED_RELEASE_TABLET | Freq: Two times a day (BID) | ORAL | Status: AC
  Administered 2018-07-29 (×2): 40 meq via ORAL
  Filled 2018-07-29 (×2): qty 2

## 2018-07-29 MED ORDER — INSULIN GLARGINE 100 UNIT/ML ~~LOC~~ SOLN
28.0000 [IU] | Freq: Every day | SUBCUTANEOUS | Status: DC
  Administered 2018-07-29 (×2): 28 [IU] via SUBCUTANEOUS
  Filled 2018-07-29 (×3): qty 0.28

## 2018-07-29 MED ORDER — MAGNESIUM OXIDE 400 (241.3 MG) MG PO TABS
400.0000 mg | ORAL_TABLET | Freq: Two times a day (BID) | ORAL | Status: AC
  Administered 2018-07-29 (×2): 400 mg via ORAL
  Filled 2018-07-29 (×2): qty 1

## 2018-07-29 NOTE — Progress Notes (Signed)
MEDICATION RELATED CONSULT NOTE - INITIAL   Pharmacy Consult for Methotrexate Indication: LGL leukemia   Allergies  Allergen Reactions  . Bee Venom Swelling  . Metformin And Related Nausea And Vomiting  . Sulfamethoxazole-Trimethoprim Nausea And Vomiting    Patient Measurements: Height: 5\' 9"  (175.3 cm) Weight: 171 lb 15.3 oz (78 kg) IBW/kg (Calculated) : 70.7 Adjusted Body Weight:   Vital Signs: Temp: 99.6 F (37.6 C) (12/22 2206) Temp Source: Oral (12/22 2206) BP: 99/41 (12/22 2300) Pulse Rate: 116 (12/22 2300) Intake/Output from previous day: No intake/output data recorded. Intake/Output from this shift: No intake/output data recorded.  Labs: Recent Labs    07/28/18 2021  WBC 15.8*  HGB 9.0*  HCT 28.5*  PLT 360  CREATININE 0.59*  ALBUMIN 3.5  PROT 6.1*  AST 21  ALT 21  ALKPHOS 68  BILITOT 1.2   Estimated Creatinine Clearance: 76.1 mL/min (A) (by C-G formula based on SCr of 0.59 mg/dL (L)).   Microbiology: No results found for this or any previous visit (from the past 720 hour(s)).  Medical History: Past Medical History:  Diagnosis Date  . Acute respiratory failure ([place])   . BPH (benign prostatic hyperplasia)   . Carpal tunnel syndrome on both sides   . Chronic diarrhea    intermittant due to chronic collagenous colitis  . 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 Collagenous colitis    chronic--- intermittant between diarrahea and constipation  . ED (erectile dysfunction)   . HCAP (healthcare-associated pneumonia)   . History of adenomatous polyp of colon   . History of Clostridium difficile colitis 05/2016  . History of pneumonia 11/08/2017   CAP -- LLL---  12-19-2017  per pt no residual symptoms  . History of SCC (squamous cell carcinoma) of skin   . History of sepsis    11-20-2017 severe sepsis due to UTI;  10/ 2017sepsis due to c-diff colitis  . Leucocytosis    chronic  . Lower urinary tract symptoms (LUTS)   . Macrocytic anemia    since 02/ 2015  . MGUS (monoclonal gammopathy of unknown significance)    hemotologist-  [name] @ [facility]  . Monoclonal paraproteinemia   . Neuropathy, peripheral   . OA (osteoarthritis)   . Prostatic stone   . Rash of face    RIGHT SIDE  . Raynaud's syndrome   . Thin skin    fragile  . Transfusion-dependent anemia since 10/ 2017   last transfusion PRBCs 07-19-2017  per hemologist note dated 10-25-2017  . Type 2 diabetes mellitus treated with insulin ([place])    followed by [name] ([facility]p)  . Urinary hesitancy   . Wears contact lenses    LEFT EYE ONLY    Medications:  (Not in a hospital admission)   Assessment: Patient taking methotrexate for LGL leukemia, per notes.    Methotrexate (Trexall; Rheumatrex) hold criteria  Hgb < 8  WBC < 3  Pltc < 100K  SCr > 1.5x baseline (or > 2 if baseline unknown)  AST or ALT >3x ULN  Bili > 1.5x ULN  Ascites or pleural effusion  Diarrhea - Grade 2 or higher  Ulcerative stomatitis  Unexplained pneumonitis / hypoxemia  Active infection   Goal  of Therapy:  Safe and effective use of methotrexate  Plan:  D/c medication at this time. Will follow plans  Nani Skillern Crowford 07/29/2018,12:11 AM

## 2018-07-29 NOTE — Evaluation (Signed)
Physical Therapy Evaluation Patient Details Name: Tommy Peterson MRN: [id number] DOB: 1941/04/01 Today's Date: 07/29/2018   History of Present Illness  77 yo male admitted 12/22 with fever, N/V; pt with sepsis with secondary encephalopathy, acute respiratory failure with hypoxia secondary to CAP. PMH includes recurrent UTIs, chronic large granular lymphocytic leukemia, DMII, paroxysmal afib, chronic diarrhea, OA.   Clinical Impression   Pt presents with LE weakness, impaired dynamic standing balance, fatigue with ambulation, and 1 LOB with ambulation recovered by PT. Pt to benefit from acute PT to address deficits. Pt ambulated 300 ft with no AD with 1 LOB that required PT steadying. PT discussed possibility of HHPT vs OPPT, but pt refuses HHPT and pt's wife states that if he is going to have therapy then they need to come to the home. PT to progress mobility as tolerated, and will continue to follow acutely.      Follow Up Recommendations Follow surgeon's recommendation for DC plan and follow-up therapies;Outpatient PT    Equipment Recommendations  None recommended by PT    Recommendations for Other Services       Precautions / Restrictions Precautions Precautions: Fall Restrictions Weight Bearing Restrictions: No      Mobility  Bed Mobility Overal bed mobility: Needs Assistance Bed Mobility: Rolling;Sidelying to Sit;Sit to Supine Rolling: Supervision Sidelying to sit: Min assist;HOB elevated   Sit to supine: Min assist;HOB elevated   General bed mobility comments: Min guard for rolling to L side to exit bed. Min assist for sidelying to sit for trunk elevation, pt with use of bedrails for UE support. Min assist for sit to supine for LE management, increased time and effort to perform. Pt provided with boost assist with use of bed pads to scoot up in bed.   Transfers Overall transfer level: Needs assistance Equipment used: None Transfers: Sit to/from Stand Sit to Stand:  Min assist;From elevated surface         General transfer comment: Min assist for power up and steadying upon standing. Pt with multiple attempts to come to standing, last attempt being successful power up. Pt able to stand statically without PT support.   Ambulation/Gait Ambulation/Gait assistance: Min assist Gait Distance (Feet): 300 Feet Assistive device: None Gait Pattern/deviations: Step-through pattern;Decreased stride length;Wide base of support Gait velocity: slightly decr    General Gait Details: Min guard for safety fo a majority of ambulation, occasional min assist for pt steadying. Pt with one significant anterior LOB at start of ambulation, balance recovered by PT with use of gait belt. Pt with 2/4 dyspnea on RA after ambulation. Pt recovered with rest.   Stairs            Wheelchair Mobility    Modified Rankin (Stroke Patients Only)       Balance Overall balance assessment: Needs assistance Sitting-balance support: No upper extremity supported Sitting balance-Leahy Scale: Good Sitting balance - Comments: able to scoot to EOB without LOB, sit on EOB without support    Standing balance support: No upper extremity supported Standing balance-Leahy Scale: Fair Standing balance comment: unable to accept challenge, LOB in standing recovered by PT                              Pertinent Vitals/Pain Pain Assessment: No/denies pain    Home Living Family/patient expects to be discharged to:: Private residence Living Arrangements: Spouse/significant other;Children Available Help at Discharge: Family;Available 24 hours/day Type of Home:  House Home Access: Stairs to enter Entrance Stairs-Rails: Left Entrance Stairs-Number of Steps: 10 Home Layout: Two level;Laundry or work area in basement;Able to live on main level with bedroom/bathroom Home Equipment: Environmental consultant - 2 wheels      Prior Function Level of Independence: Needs assistance   Gait / Transfers  Assistance Needed: Pt reports no AD needed for ambulation. Pt with fall on stairs 4 months ago.   ADL's / Homemaking Assistance Needed: Pt reports wife and college-age children assist with cooking and cleaning   Comments: Pt has 4 adopted children, two just graduated from college and live at home with him. The other two are middle and high school age. Pt drives children to and from school per his report.      Hand Dominance   Dominant Hand: Left    Extremity/Trunk Assessment   Upper Extremity Assessment Upper Extremity Assessment: Overall WFL for tasks assessed    Lower Extremity Assessment Lower Extremity Assessment: Generalized weakness    Cervical / Trunk Assessment Cervical / Trunk Assessment: Normal  Communication   Communication: No difficulties  Cognition Arousal/Alertness: Awake/alert Behavior During Therapy: WFL for tasks assessed/performed Overall Cognitive Status: Within Functional Limits for tasks assessed                                        General Comments      Exercises     Assessment/Plan    PT Assessment Patient needs continued PT services  PT Problem List Decreased strength;Decreased activity tolerance;Decreased balance;Decreased mobility       PT Treatment Interventions Therapeutic activities;Gait training;Therapeutic exercise;Patient/family education;Balance training;Stair training;Functional mobility training    PT Goals (Current goals can be found in the Care Plan section)  Acute Rehab PT Goals PT Goal Formulation: With patient Time For Goal Achievement: 08/12/18 Potential to Achieve Goals: Good    Frequency Min 3X/week   Barriers to discharge        Co-evaluation               AM-PAC PT "6 Clicks" Mobility  Outcome Measure Help needed turning from your back to your side while in a flat bed without using bedrails?: A Little Help needed moving from lying on your back to sitting on the side of a flat bed  without using bedrails?: A Little Help needed moving to and from a bed to a chair (including a wheelchair)?: A Little Help needed standing up from a chair using your arms (e.g., wheelchair or bedside chair)?: A Little Help needed to walk in hospital room?: A Little Help needed climbing 3-5 steps with a railing? : A Little 6 Click Score: 18    End of Session Equipment Utilized During Treatment: Gait belt Activity Tolerance: Patient tolerated treatment well;Patient limited by fatigue Patient left: in bed;with bed alarm set;with call bell/phone within reach;with family/visitor present Nurse Communication: Mobility status PT Visit Diagnosis: Unsteadiness on feet (R26.81);Other abnormalities of gait and mobility (R26.89)    Time: 8250-5397 PT Time Calculation (min) (ACUTE ONLY): 27 min   Charges:   PT Evaluation $PT Eval Low Complexity: 1 Low PT Treatments $Gait Training: 8-22 mins      Julien Girt, PT Acute Rehabilitation Services Pager [phone]  Office [phone]  [place] Wachter D Elonda Husky 07/29/2018, 3:13 PM

## 2018-07-29 NOTE — ED Notes (Signed)
Fiance and daughter contacted regarding pt's whereabouts.

## 2018-07-29 NOTE — Progress Notes (Signed)
Pharmacy called writer stating pt insulin is not available, pharmacy followed up with provider, provider ordered lantus until the patient can possibly bring insulin from home. Will continue to monitor

## 2018-07-29 NOTE — Progress Notes (Signed)
Notified provider pt blood pressure 94/48, will continue to monitor

## 2018-07-29 NOTE — Progress Notes (Addendum)
[facility] PROGRESS NOTE    Progress Note  Tommy Peterson  EAV:[id number] DOB: Jan 28, 1941 DOA: 07/28/2018 PCP: Jani Gravel, MD     Brief Narrative:   Tommy Peterson is an 77 y.o. male past medical history significant for chronic large granular lymphocytic leukemia methotrexate diabetes mellitus type 2 paroxysmal atrial fibrillation not on anticoagulation recurrent pneumonias who presents with fevers.  Late that the morning of admission he took his temperature was as high as 100.4 became a little bit disoriented intermittently as per wife.  In the ED he was found to have a fever pulse of 116 mild leukocytosis of 15 hemoglobin at baseline of 9 and a lactic acid of 2.4 influenza PCR was negative chest x-ray showed infiltrate on the left concerning for pneumonia.  Assessment/Plan:   Sepsis ([place]) : He was started on the sepsis pathway was started on empiric antibiotics and fluid resuscitation. Her data is pending. I agree with IV Rocephin and azithromycin. Lactic acidosis has resolved. Worsening leukocytosis, but he has remained afebrile. Going back through his chest x-ray that infiltrate appears to be on previous imaging. Blood cultures have been ordered and will await results I am concerned about bacteremia on him. We will get a CT scan of the chest to evaluate that infiltrate seen on chest x-ray. His leukocytosis could be due to his malignancy. Also In the differential is that he may be aspirating recurrently. But leaning more towards his malignancy.  Acute respiratory failure with hypoxia due to community-acquired pneumonia: Keep on nasal cannula to keep saturation above 90%. Continue inhalers.  Encephalopathy in sepsis: With resolution of sepsis.  Diabetes mellitus type 2 with hyperglycemia: He was started on long-acting insulin plus sliding scale his blood glucose seems to be improving. He was continue his oral hypoglycemic agents except Actos.  Macrocytic  anemia: His hemoglobin seems to be at baseline. 12 and folate are within normal.  Large granular lymphocytic leukemia ([place]) On methotrexate follow-up with [facility] as an outpatient.  Chronic constipation: We will continue to monitor.  Paroxysmal atrial fibrillation: CHA2DS2-VASc score = 4 He was given metoprolol and Eliquis on last admission is apparently he never refilled his prescription. There is a history of anemia and that he is in sinus rhythm decided not to place him on anticoagulation. Will defer anticoagulation to his cardiologist which she will follow-up with.  BPH: On no medications.  DVT prophylaxis: lovenxo Family Communication:none Disposition Plan/Barrier to D/C: unable to determine Code Status:     Code Status Orders  (From admission, onward)         Start     Ordered   07/28/18 2308  Full code  Continuous     07/28/18 2309        Code Status History    Date Active Date Inactive Code Status Order ID Comments User Context   11/20/2017 0625 11/24/2017 1827 Full Code [id number]  Etta Quill, DO ED   12/26/2016 1700 12/27/2016 2028 Full Code [id number]  Dionne Milo, NP Inpatient   05/27/2016 2320 06/01/2016 1832 Full Code [id number]  Phillips Grout, MD Inpatient   05/04/2016 2241 05/06/2016 1242 Full Code [id number]  Jani Gravel, MD Inpatient        IV Access:    Peripheral IV   Procedures and diagnostic studies:   Dg Chest 2 View  Result Date: 07/28/2018 CLINICAL DATA:  Fevers with nausea and vomiting EXAM: CHEST - 2 VIEW COMPARISON:  7/15/9 FINDINGS: Cardiac shadow is at the  upper limits of normal in size. Aortic calcifications are again seen. Loop recorder is now noted. The lungs are well aerated bilaterally. Patchy bibasilar infiltrative changes are seen slightly worse on the left than the right. No bony abnormality is noted. IMPRESSION: Bibasilar infiltrates left greater than right. Electronically Signed   By: Inez Catalina M.D.   On:  07/28/2018 21:19     Medical Consultants:    None.  Anti-Infectives:   IV Rocephin and azithromycin  Subjective:    Tommy Peterson he continues to have a dry hacking cough.  He relates he is mildly short of breath with ambulation. He relates he feels very tired.  Objective:    Vitals:   07/28/18 2300 07/29/18 0018 07/29/18 0058 07/29/18 0530  BP: (!) 99/41  115/63 (!) 94/48  Pulse: (!) 116  (!) 109 71  Resp: (!) 23  20 20   Temp:  99.5 F (37.5 C) 98.9 F (37.2 C) (!) 97.5 F (36.4 C)  TempSrc:  Oral Oral Oral  SpO2: 92%  94% 99%  Weight:      Height:        Intake/Output Summary (Last 24 hours) at 07/29/2018 0900 Last data filed at 07/29/2018 0400 Gross per 24 hour  Intake 1583 ml  Output -  Net 1583 ml   Filed Weights   07/28/18 1955  Weight: 78 kg    Exam: General exam: In no acute distress. Respiratory system: Good air movement and with crackles bilaterally in lower lobes not clear with coughing Cardiovascular system: Rate and rhythm with positive S1-S2 no JVD Gastrointestinal system: Positive bowel sounds soft nontender nondistended Central nervous system: Alert and oriented x3 nonfocal. Extremities: No pedal edema. Skin: No rashes, lesions or ulcers Psychiatry: And insight appear normal, mood is appropriate.   Data Reviewed:    Labs: Basic Metabolic Panel: Recent Labs  Lab 07/28/18 2021 07/29/18 0445  NA 136 136  K 4.2 2.8*  CL 106 109  CO2 20* 19*  GLUCOSE 344* 283*  BUN 14 16  CREATININE 0.59* 0.78  CALCIUM 8.4* 7.5*   GFR Estimated Creatinine Clearance: 76.1 mL/min (by C-G formula based on SCr of 0.78 mg/dL). Liver Function Tests: Recent Labs  Lab 07/28/18 2021  AST 21  ALT 21  ALKPHOS 68  BILITOT 1.2  PROT 6.1*  ALBUMIN 3.5   No results for input(s): LIPASE, AMYLASE in the last 168 hours. No results for input(s): AMMONIA in the last 168 hours. Coagulation profile No results for input(s): INR, PROTIME in the last  168 hours.  CBC: Recent Labs  Lab 07/28/18 2021 07/29/18 0445  WBC 15.8* 32.3*  NEUTROABS 13.5*  --   HGB 9.0* 7.5*  HCT 28.5* 24.2*  MCV 121.8* 126.0*  PLT 360 283   Cardiac Enzymes: No results for input(s): CKTOTAL, CKMB, CKMBINDEX, TROPONINI in the last 168 hours. BNP (last 3 results) No results for input(s): PROBNP in the last 8760 hours. CBG: Recent Labs  Lab 07/29/18 0726  GLUCAP 215*   D-Dimer: No results for input(s): DDIMER in the last 72 hours. Hgb A1c: No results for input(s): HGBA1C in the last 72 hours. Lipid Profile: No results for input(s): CHOL, HDL, LDLCALC, TRIG, CHOLHDL, LDLDIRECT in the last 72 hours. Thyroid function studies: No results for input(s): TSH, T4TOTAL, T3FREE, THYROIDAB in the last 72 hours.  Invalid input(s): FREET3 Anemia work up: Recent Labs    07/29/18 [place] 374   Sepsis Labs: Recent Labs  Lab 07/28/18  2021 07/28/18 2023 07/28/18 2221 07/29/18 0158 07/29/18 0445  PROCALCITON 0.18  --   --   --   --   WBC 15.8*  --   --   --  32.3*  LATICACIDVEN  --  1.66 2.24* 1.5 1.3   Microbiology No results found for this or any previous visit (from the past 240 hour(s)).   Medications:   . budesonide  3 mg Oral QPC breakfast  . enoxaparin (LOVENOX) injection  40 mg Subcutaneous QHS  . guaiFENesin  600 mg Oral BID  . insulin aspart  0-9 Units Subcutaneous TID WC  . insulin glargine  28 Units Subcutaneous QHS  . sodium chloride flush  3 mL Intravenous Q12H   Continuous Infusions: . sodium chloride 125 mL/hr at 07/28/18 2255  . cefTRIAXone (ROCEPHIN)  IV Stopped (07/28/18 2117)     LOS: 1 day   Charlynne Cousins  [facility]   *Please refer to [email or web address], password [email or web address] to get updated schedule on who will round on this patient, as hospitalists switch teams weekly. If 7PM-7AM, please contact night-coverage at [email or web address], password [email or web address] for any overnight needs.  07/29/2018, 9:00 AM

## 2018-07-29 NOTE — Progress Notes (Signed)
Patient bed alarm went off. This RN and Ash NT went in to check on patient. Patient was visibly upset about bed alarm going off and stated " Turn this thing off! They have never had an alarm on before." Staff attempted to educate patient on the reason for bed alarm for his safety. Patient still insisted it be turned off.

## 2018-07-30 DIAGNOSIS — D539 Nutritional anemia, unspecified: Secondary | ICD-10-CM

## 2018-07-30 DIAGNOSIS — C91Z0 Other lymphoid leukemia not having achieved remission: Secondary | ICD-10-CM

## 2018-07-30 DIAGNOSIS — J189 Pneumonia, unspecified organism: Secondary | ICD-10-CM

## 2018-07-30 DIAGNOSIS — A419 Sepsis, unspecified organism: Principal | ICD-10-CM

## 2018-07-30 LAB — CBC WITH DIFFERENTIAL/PLATELET
Abs Immature Granulocytes: 0.14 10*3/uL — ABNORMAL HIGH (ref 0.00–0.07)
Basophils Absolute: 0 10*3/uL (ref 0.0–0.1)
Basophils Relative: 0 %
EOS PCT: 1 %
Eosinophils Absolute: 0.1 10*3/uL (ref 0.0–0.5)
HCT: 24 % — ABNORMAL LOW (ref 39.0–52.0)
Hemoglobin: 7.4 g/dL — ABNORMAL LOW (ref 13.0–17.0)
Immature Granulocytes: 1 %
Lymphocytes Relative: 29 %
Lymphs Abs: 3 10*3/uL (ref 0.7–4.0)
MCH: 40.4 pg — AB (ref 26.0–34.0)
MCHC: 30.8 g/dL (ref 30.0–36.0)
MCV: 131.1 fL — AB (ref 80.0–100.0)
Monocytes Absolute: 0.8 10*3/uL (ref 0.1–1.0)
Monocytes Relative: 8 %
Neutro Abs: 6.4 10*3/uL (ref 1.7–7.7)
Neutrophils Relative %: 61 %
Platelets: 271 10*3/uL (ref 150–400)
RBC: 1.83 MIL/uL — AB (ref 4.22–5.81)
RDW: 21.1 % — ABNORMAL HIGH (ref 11.5–15.5)
WBC: 10.5 10*3/uL (ref 4.0–10.5)
nRBC: 0 % (ref 0.0–0.2)

## 2018-07-30 LAB — FOLATE RBC
Folate, Hemolysate: 277.5 ng/mL
Folate, RBC: 1334 ng/mL (ref >498)
HEMATOCRIT: 20.8 % — AB (ref 37.5–51.0)

## 2018-07-30 LAB — GLUCOSE, CAPILLARY
Glucose-Capillary: 121 mg/dL — ABNORMAL HIGH (ref 70–99)
Glucose-Capillary: 115 mg/dL — ABNORMAL HIGH (ref 70–99)

## 2018-07-30 LAB — PROCALCITONIN: Procalcitonin: 21.88 ng/mL

## 2018-07-30 LAB — STREP PNEUMONIAE URINARY ANTIGEN: Strep Pneumo Urinary Antigen: NEGATIVE

## 2018-07-30 MED ORDER — CEFDINIR 300 MG PO CAPS: 300.0000 mg | ORAL_CAPSULE | Freq: Two times a day (BID) | ORAL | 0 refills | Status: AC

## 2018-07-30 NOTE — Discharge Summary (Signed)
Physician Discharge Summary  Tommy Peterson HAL:[id number] DOB: 24-Feb-1941 DOA: 07/28/2018  PCP: Jani Gravel, MD  Admit date: 07/28/2018 Discharge date: 07/30/2018  Admitted From: Home  Disposition:  Home   Recommendations for Outpatient Follow-up:  1. Follow up with PCP in 1-2 weeks 2. Repeat lab work including CBC in 3 days which can be followed up by PCP 3. 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  He was found to be septic secondary to pneumonia.  Influenza was negative.  He was started on azithromycin and Rocephin.  Over the course of his hospitalization his symptoms significantly improved and he felt back to baseline.  Unfortunately his WBC increased to about 32.  Denied any diarrhea. On the day of discharge patient insisted he wanted to be discharged with outpatient follow-up.  Patient understood that despite of feeling better and his WBC counts are still significantly elevated but he still wants to go home with outpatient follow-up.   Discharge Diagnoses:  Principal Problem:   Sepsis ([place]) Active Problems:   Encephalopathy in sepsis   Large granular lymphocytic leukemia (HCC)   Macrocytic anemia   BPH (benign prostatic hyperplasia)   CAP (community acquired pneumonia)  Sepsis secondary to community-acquired pneumonia Acute hypoxia, resolved -We will switch his oral antibiotics to [facility] for 7 more days -Worsening leukocytosis but patient remained stable and he wants to go home.  He understands that his WBC counts are worsening will get outpatient repeat lab work in about 3 days to ensure this is improved -CT of the chest shows no congestive  heart failure without any evidence of consolidation. -Urine strep pneumonia is negative Urine Legionella is pending  Diabetes mellitus type 2 -Resume home medications  Large granular lymphocytic leukemia -Follow-up outpatient at [facility].  Currently on methotrexate  Paroxysmal atrial fibrillation -He is on metoprolol.  Follows outpatient with cardiology and per patient's choice he does not want to be on anticoagulation  BPH -Resume home meds  DVT prophylaxis-Lovenox Discharge today at patient's request  Discharge Instructions   Allergies as of 07/30/2018      Reactions   Bee Venom Swelling   Metformin And Related Nausea And Vomiting   Sulfamethoxazole-trimethoprim Nausea And Vomiting      Medication List    TAKE these medications   acetaminophen 325 MG tablet Commonly known as:  TYLENOL Take 2 tablets (650 mg total) by mouth every 6 (six) hours as needed for mild pain (or Fever >/= 101).   aspirin 325 MG EC tablet Take 650 mg by mouth every 6 (six) hours as needed for pain.   budesonide 3 MG 24 hr capsule Commonly known as:  ENTOCORT EC Take 3 mg by mouth daily after breakfast.   cefdinir 300 MG capsule Commonly known as:  OMNICEF Take 1 capsule (300 mg total) by mouth 2 (two) times daily for 7 days.   methotrexate 2.5 MG tablet Commonly known as:  RHEUMATREX Take 30 mg by mouth every Tuesday.   pioglitazone 15 MG tablet Commonly known as:  ACTOS Take 15 mg by mouth every morning.   SOLIQUA 100-33 UNT-MCG/ML Sopn Generic drug:  Insulin Glargine-Lixisenatide Inject 28 Units into the skin at bedtime.   vitamin B-12 500 MCG tablet  Commonly known as:  CYANOCOBALAMIN Take 1 tablet (500 mcg total) by mouth daily.   vitamin C 1000 MG tablet Take 1,000 mg by mouth daily.       Allergies  Allergen Reactions  . Bee Venom Swelling  . Metformin And Related Nausea And Vomiting  . Sulfamethoxazole-Trimethoprim Nausea And Vomiting    You were cared for by a  hospitalist during your hospital stay. If you have any questions about your discharge medications or the care you received while you were in the hospital after you are discharged, you can call the unit and asked to speak with the hospitalist on call if the hospitalist that took care of you is not available. Once you are discharged, your primary care physician will handle any further medical issues. Please note that no refills for any discharge medications will be authorized once you are discharged, as it is imperative that you return to your primary care physician (or establish a relationship with a primary care physician if you do not have one) for your aftercare needs so that they can reassess your need for medications and monitor your lab values.  Consultations:  None   Procedures/Studies: Dg Chest 2 View  Result Date: 07/28/2018 CLINICAL DATA:  Fevers with nausea and vomiting EXAM: CHEST - 2 VIEW COMPARISON:  7/15/9 FINDINGS: Cardiac shadow is at the upper limits of normal in size. Aortic calcifications are again seen. Loop recorder is now noted. The lungs are well aerated bilaterally. Patchy bibasilar infiltrative changes are seen slightly worse on the left than the right. No bony abnormality is noted. IMPRESSION: Bibasilar infiltrates left greater than right. Electronically Signed   By: Inez Catalina M.D.   On: 07/28/2018 21:19   Ct Chest Wo Contrast  Result Date: 07/29/2018 CLINICAL DATA:  CLL, fever, tachycardia, leukocytosis, abnormal chest radiograph. EXAM: CT CHEST WITHOUT CONTRAST TECHNIQUE: Multidetector CT imaging of the chest was performed following the standard protocol without IV contrast. COMPARISON:  Chest radiograph 07/28/2018. Chest radiograph 07/28/2018, CT abdomen pelvis 11/08/2017 and CT chest 09/05/2016. FINDINGS: Cardiovascular: Atherosclerotic calcification of the aorta and coronary arteries. Heart is enlarged. No pericardial effusion. Mediastinum/Nodes: Fat density lesion  is seen in association with the right lobe of the thyroid, as before. Mediastinal lymph nodes are not enlarged by CT size criteria. Hilar regions are difficult to evaluate without IV contrast. There does appear to be a 1.2 cm right hilar lymph node. No axillary adenopathy. Esophagus is grossly unremarkable. Lungs/Pleura: Biapical pleuroparenchymal scarring. Centrilobular and paraseptal mild dependent atelectasis and septal thickening. Nonspecific subpleural consolidation in the inferior right middle lobe (series 11, image 39), similar to the prior exam. No airspace consolidation. Tiny right pleural effusion. Airway is unremarkable. Upper Abdomen: Subcentimeter low-attenuation lesions in the liver are too small to characterize. Visualized portions of the liver, gallbladder, adrenal glands, left kidney, spleen, pancreas, stomach and bowel are grossly unremarkable. Musculoskeletal: Degenerative changes in the spine. No worrisome lytic or sclerotic lesions. IMPRESSION: 1. Question mild congestive heart failure. No consolidation to indicate pneumonia. 2. Aortic atherosclerosis (ICD10-170.0). Coronary artery calcification. Electronically Signed   By: Lorin Picket M.D.   On: 07/29/2018 10:17      Subjective: Feels better and continues to demand he wants to go home.  He understands his WBC count is elevated but he keeps insisting he is feeling much better and back to his baseline therefore wants to go home.  General = no fevers, chills, dizziness, malaise, fatigue HEENT/EYES = negative for pain, redness, loss  of vision, double vision, blurred vision, loss of hearing, sore throat, hoarseness, dysphagia Cardiovascular= negative for chest pain, palpitation, murmurs, lower extremity swelling Respiratory/lungs= negative for shortness of breath, cough, hemoptysis, wheezing, mucus production Gastrointestinal= negative for nausea, vomiting,, abdominal pain, melena, hematemesis Genitourinary= negative for Dysuria,  Hematuria, Change in Urinary Frequency MSK = Negative for arthralgia, myalgias, Back Pain, Joint swelling  Neurology= Negative for headache, seizures, numbness, tingling  Psychiatry= Negative for anxiety, depression, suicidal and homocidal ideation Allergy/Immunology= Medication/Food allergy as listed  Skin= Negative for Rash, lesions, ulcers, itching    Discharge Exam: Vitals:   07/29/18 2114 07/30/18 0520  BP: 105/65 119/68  Pulse: 100 92  Resp: 20 20  Temp: 97.9 F (36.6 C) 98.5 F (36.9 C)  SpO2: 93% 96%   Vitals:   07/29/18 0530 07/29/18 1332 07/29/18 2114 07/30/18 0520  BP: (!) 94/48 93/60 105/65 119/68  Pulse: 71 86 100 92  Resp: 20 20 20 20   Temp: (!) 97.5 F (36.4 C) (!) 97.5 F (36.4 C) 97.9 F (36.6 C) 98.5 F (36.9 C)  TempSrc: Oral Oral Oral Oral  SpO2: 99% 99% 93% 96%  Weight:      Height:        General: Pt is alert, awake, not in acute distress Cardiovascular: RRR, S1/S2 +, no rubs, no gallops Respiratory: CTA bilaterally, no wheezing, no rhonchi Abdominal: Soft, NT, ND, bowel sounds + Extremities: no edema, no cyanosis    The results of significant diagnostics from this hospitalization (including imaging, microbiology, ancillary and laboratory) are listed below for reference.     Microbiology: Recent Results (from the past 240 hour(s))  Blood Culture (routine x 2)     Status: None (Preliminary result)   Collection Time: 07/28/18  8:21 PM  Result Value Ref Range Status   Specimen Description   Final    BLOOD RIGHT WRIST Performed at [facility] [address], [place], [place] [address]    Special Requests   Final    BOTTLES DRAWN AEROBIC AND ANAEROBIC Blood Culture adequate volume Performed at [facility] [address], [place], [place] [address]    Culture   Final    NO GROWTH 2 DAYS Performed at [facility] [address] [place], [place] [address]    Report Status PENDING   Incomplete  Blood Culture (routine x 2)     Status: None (Preliminary result)   Collection Time: 07/28/18  8:21 PM  Result Value Ref Range Status   Specimen Description   Final    BLOOD LEFT FOREARM Performed at [facility] [address], [place], [place] [address]    Special Requests   Final    BOTTLES DRAWN AEROBIC AND ANAEROBIC Blood Culture results may not be optimal due to an excessive volume of blood received in culture bottles Performed at [facility] [address], [place], [place] [address]    Culture   Final    NO GROWTH 2 DAYS Performed at [facility] [address] [place], [place] [address]    Report Status PENDING  Incomplete  Gastrointestinal Panel by PCR , Stool     Status: None   Collection Time: 07/28/18  8:22 PM  Result Value Ref Range Status   Campylobacter species NOT DETECTED NOT DETECTED Final   Plesimonas shigelloides NOT DETECTED NOT DETECTED Final   Salmonella species NOT DETECTED NOT DETECTED Final   Yersinia enterocolitica NOT DETECTED  NOT DETECTED Final   Vibrio species NOT DETECTED NOT DETECTED Final   Vibrio cholerae NOT DETECTED NOT DETECTED Final   Enteroaggregative E coli (EAEC) NOT DETECTED NOT DETECTED Final   Enteropathogenic E coli (EPEC) NOT DETECTED NOT DETECTED Final   Enterotoxigenic E coli (ETEC) NOT DETECTED NOT DETECTED Final   Shiga like toxin producing E coli (STEC) NOT DETECTED NOT DETECTED Final   Shigella/Enteroinvasive E coli (EIEC) NOT DETECTED NOT DETECTED Final   Cryptosporidium NOT DETECTED NOT DETECTED Final   Cyclospora cayetanensis NOT DETECTED NOT DETECTED Final   Entamoeba histolytica NOT DETECTED NOT DETECTED Final   Giardia lamblia NOT DETECTED NOT DETECTED Final   Adenovirus F40/41 NOT DETECTED NOT DETECTED Final   Astrovirus NOT DETECTED NOT DETECTED Final   Norovirus GI/GII NOT DETECTED NOT DETECTED Final   Rotavirus A NOT DETECTED NOT DETECTED Final   Sapovirus  (I, II, IV, and V) NOT DETECTED NOT DETECTED Final    Comment: Performed at [facility], [address] [place], [place] [address]  Respiratory Panel by PCR     Status: None   Collection Time: 07/29/18  2:00 AM  Result Value Ref Range Status   Adenovirus NOT DETECTED NOT DETECTED Final   Coronavirus 229E NOT DETECTED NOT DETECTED Final   Coronavirus HKU1 NOT DETECTED NOT DETECTED Final   Coronavirus NL63 NOT DETECTED NOT DETECTED Final   Coronavirus OC43 NOT DETECTED NOT DETECTED Final   Metapneumovirus NOT DETECTED NOT DETECTED Final   Rhinovirus / Enterovirus NOT DETECTED NOT DETECTED Final   Influenza A NOT DETECTED NOT DETECTED Final   Influenza B NOT DETECTED NOT DETECTED Final   Parainfluenza Virus 1 NOT DETECTED NOT DETECTED Final   Parainfluenza Virus 2 NOT DETECTED NOT DETECTED Final   Parainfluenza Virus 3 NOT DETECTED NOT DETECTED Final   Parainfluenza Virus 4 NOT DETECTED NOT DETECTED Final   Respiratory Syncytial Virus NOT DETECTED NOT DETECTED Final   Bordetella pertussis NOT DETECTED NOT DETECTED Final   Chlamydophila pneumoniae NOT DETECTED NOT DETECTED Final   Mycoplasma pneumoniae NOT DETECTED NOT DETECTED Final    Comment: Performed at [facility], [place] [address], [place], [place] [address]     Labs: BNP (last 3 results) Recent Labs    02/18/18 0951  BNP 75.6   Basic Metabolic Panel: Recent Labs  Lab 07/28/18 2021 07/29/18 0445  NA 136 136  K 4.2 2.8*  CL 106 109  CO2 20* 19*  GLUCOSE 344* 283*  BUN 14 16  CREATININE 0.59* 0.78  CALCIUM 8.4* 7.5*   Liver Function Tests: Recent Labs  Lab 07/28/18 2021  AST 21  ALT 21  ALKPHOS 68  BILITOT 1.2  PROT 6.1*  ALBUMIN 3.5   No results for input(s): LIPASE, AMYLASE in the last 168 hours. No results for input(s): AMMONIA in the last 168 hours. CBC: Recent Labs  Lab 07/28/18 2021 07/29/18 0445 07/30/18 0938  WBC 15.8* 32.3* 10.5  NEUTROABS 13.5*  --  6.4  HGB 9.0* 7.5* 7.4*   HCT 28.5* 24.2*  20.8* 24.0*  MCV 121.8* 126.0* 131.1*  PLT 360 283 271   Cardiac Enzymes: No results for input(s): CKTOTAL, CKMB, CKMBINDEX, TROPONINI in the last 168 hours. BNP: Invalid input(s): POCBNP CBG: Recent Labs  Lab 07/29/18 1147 07/29/18 1639 07/29/18 2116 07/30/18 0729 07/30/18 1129  GLUCAP 95 113* 162* 121* 115*   D-Dimer No results for input(s): DDIMER in the last 72 hours. Hgb A1c No results  for input(s): HGBA1C in the last 72 hours. Lipid Profile No results for input(s): CHOL, HDL, LDLCALC, TRIG, CHOLHDL, LDLDIRECT in the last 72 hours. Thyroid function studies No results for input(s): TSH, T4TOTAL, T3FREE, THYROIDAB in the last 72 hours.  Invalid input(s): FREET3 Anemia work up Recent Labs    07/29/18 0445  VITAMINB12 374   Urinalysis    Component Value Date/Time   COLORURINE YELLOW 07/29/2018 [place] 07/29/2018 2240   LABSPEC 1.015 07/29/2018 2240   PHURINE 5.0 07/29/2018 2240   GLUCOSEU 50 (A) 07/29/2018 2240   HGBUR NEGATIVE 07/29/2018 2240   BILIRUBINUR NEGATIVE 07/29/2018 2240   BILIRUBINUR negative 10/29/2017 1428   KETONESUR NEGATIVE 07/29/2018 2240   PROTEINUR NEGATIVE 07/29/2018 2240   UROBILINOGEN 0.2 10/29/2017 1428   NITRITE NEGATIVE 07/29/2018 2240   LEUKOCYTESUR NEGATIVE 07/29/2018 2240   Sepsis Labs Invalid input(s): PROCALCITONIN,  WBC,  LACTICIDVEN Microbiology Recent Results (from the past 240 hour(s))  Blood Culture (routine x 2)     Status: None (Preliminary result)   Collection Time: 07/28/18  8:21 PM  Result Value Ref Range Status   Specimen Description   Final    BLOOD RIGHT WRIST Performed at [facility] [address], [place], [place] [address]    Special Requests   Final    BOTTLES DRAWN AEROBIC AND ANAEROBIC Blood Culture adequate volume Performed at [facility] [address], [place], [place] [address]    Culture   Final    NO GROWTH 2  DAYS Performed at [facility] [address] [place], [place] [address]    Report Status PENDING  Incomplete  Blood Culture (routine x 2)     Status: None (Preliminary result)   Collection Time: 07/28/18  8:21 PM  Result Value Ref Range Status   Specimen Description   Final    BLOOD LEFT FOREARM Performed at [facility] [address] [place], [place] [address]    Special Requests   Final    BOTTLES DRAWN AEROBIC AND ANAEROBIC Blood Culture results may not be optimal due to an excessive volume of blood received in culture bottles Performed at [facility] [address] [place], [place] [address]    Culture   Final    NO GROWTH 2 DAYS Performed at [facility] [address] [place], [place] [address]    Report Status PENDING  Incomplete  Gastrointestinal Panel by PCR , Stool     Status: None   Collection Time: 07/28/18  8:22 PM  Result Value Ref Range Status   Campylobacter species NOT DETECTED NOT DETECTED Final   Plesimonas shigelloides NOT DETECTED NOT DETECTED Final   Salmonella species NOT DETECTED NOT DETECTED Final   Yersinia enterocolitica NOT DETECTED NOT DETECTED Final   Vibrio species NOT DETECTED NOT DETECTED Final   Vibrio cholerae NOT DETECTED NOT DETECTED Final   Enteroaggregative E coli (EAEC) NOT DETECTED NOT DETECTED Final   Enteropathogenic E coli (EPEC) NOT DETECTED NOT DETECTED Final   Enterotoxigenic E coli (ETEC) NOT DETECTED NOT DETECTED Final   Shiga like toxin producing E coli (STEC) NOT DETECTED NOT DETECTED Final   Shigella/Enteroinvasive E coli (EIEC) NOT DETECTED NOT DETECTED Final   Cryptosporidium NOT DETECTED NOT DETECTED Final   Cyclospora cayetanensis NOT DETECTED NOT DETECTED Final   Entamoeba histolytica NOT DETECTED NOT DETECTED Final   Giardia lamblia NOT DETECTED NOT DETECTED Final  Adenovirus F40/41 NOT DETECTED NOT DETECTED Final   Astrovirus NOT DETECTED NOT DETECTED Final    Norovirus GI/GII NOT DETECTED NOT DETECTED Final   Rotavirus A NOT DETECTED NOT DETECTED Final   Sapovirus (I, II, IV, and V) NOT DETECTED NOT DETECTED Final    Comment: Performed at [facility], [address] [place], [place] [address]  Respiratory Panel by PCR     Status: None   Collection Time: 07/29/18  2:00 AM  Result Value Ref Range Status   Adenovirus NOT DETECTED NOT DETECTED Final   Coronavirus 229E NOT DETECTED NOT DETECTED Final   Coronavirus HKU1 NOT DETECTED NOT DETECTED Final   Coronavirus NL63 NOT DETECTED NOT DETECTED Final   Coronavirus OC43 NOT DETECTED NOT DETECTED Final   Metapneumovirus NOT DETECTED NOT DETECTED Final   Rhinovirus / Enterovirus NOT DETECTED NOT DETECTED Final   Influenza A NOT DETECTED NOT DETECTED Final   Influenza B NOT DETECTED NOT DETECTED Final   Parainfluenza Virus 1 NOT DETECTED NOT DETECTED Final   Parainfluenza Virus 2 NOT DETECTED NOT DETECTED Final   Parainfluenza Virus 3 NOT DETECTED NOT DETECTED Final   Parainfluenza Virus 4 NOT DETECTED NOT DETECTED Final   Respiratory Syncytial Virus NOT DETECTED NOT DETECTED Final   Bordetella pertussis NOT DETECTED NOT DETECTED Final   Chlamydophila pneumoniae NOT DETECTED NOT DETECTED Final   Mycoplasma pneumoniae NOT DETECTED NOT DETECTED Final    Comment: Performed at [facility], [place] [address] [place], [place] [address]     Time coordinating discharge:  I have spent 35 minutes face to face with the patient and on the ward discussing the patients care, assessment, plan and disposition with other care givers. >50% of the time was devoted counseling the patient about the risks and benefits of treatment/Discharge disposition and coordinating care.   SIGNED:   Damita Lack, MD  [facility] 07/30/2018, 5:21 PM Pager   If 7PM-7AM, please contact night-coverage [email or web address] Password [email or web address]

## 2018-07-31 LAB — URINE CULTURE: Culture: NO GROWTH

## 2018-07-31 LAB — LEGIONELLA PNEUMOPHILA SEROGP 1 UR AG: L. pneumophila Serogp 1 Ur Ag: NEGATIVE

## 2018-08-02 DIAGNOSIS — Z794 Long term (current) use of insulin: Secondary | ICD-10-CM | POA: Diagnosis not present

## 2018-08-02 DIAGNOSIS — K529 Noninfective gastroenteritis and colitis, unspecified: Secondary | ICD-10-CM | POA: Diagnosis not present

## 2018-08-02 DIAGNOSIS — E119 Type 2 diabetes mellitus without complications: Secondary | ICD-10-CM | POA: Diagnosis not present

## 2018-08-02 LAB — CULTURE, BLOOD (ROUTINE X 2)
CULTURE: NO GROWTH
Culture: NO GROWTH
Special Requests: ADEQUATE

## 2018-08-03 LAB — CUP PACEART REMOTE DEVICE CHECK
Date Time Interrogation Session: 20191109224036
Implantable Pulse Generator Implant Date: 20190904

## 2018-08-06 ENCOUNTER — Other Ambulatory Visit: Payer: Self-pay | Admitting: Cardiovascular Disease

## 2018-08-06 DIAGNOSIS — E118 Type 2 diabetes mellitus with unspecified complications: Secondary | ICD-10-CM | POA: Diagnosis not present

## 2018-08-06 DIAGNOSIS — C9190 Lymphoid leukemia, unspecified not having achieved remission: Secondary | ICD-10-CM | POA: Diagnosis not present

## 2018-08-06 DIAGNOSIS — M79606 Pain in leg, unspecified: Secondary | ICD-10-CM | POA: Diagnosis not present

## 2018-08-06 DIAGNOSIS — J189 Pneumonia, unspecified organism: Secondary | ICD-10-CM | POA: Diagnosis not present

## 2018-08-15 DIAGNOSIS — D472 Monoclonal gammopathy: Secondary | ICD-10-CM | POA: Diagnosis not present

## 2018-08-15 DIAGNOSIS — C9110 Chronic lymphocytic leukemia of B-cell type not having achieved remission: Secondary | ICD-10-CM | POA: Diagnosis not present

## 2018-08-15 DIAGNOSIS — D649 Anemia, unspecified: Secondary | ICD-10-CM | POA: Diagnosis not present

## 2018-08-20 ENCOUNTER — Ambulatory Visit (INDEPENDENT_AMBULATORY_CARE_PROVIDER_SITE_OTHER): Payer: Medicare HMO

## 2018-08-20 DIAGNOSIS — I480 Paroxysmal atrial fibrillation: Secondary | ICD-10-CM | POA: Diagnosis not present

## 2018-08-21 NOTE — Progress Notes (Signed)
Carelink Summary Report / Loop Recorder 

## 2018-08-23 LAB — CUP PACEART REMOTE DEVICE CHECK
Implantable Pulse Generator Implant Date: 20190904
MDC IDC SESS DTM: 20200114234046

## 2018-08-27 LAB — CUP PACEART INCLINIC DEVICE CHECK
Date Time Interrogation Session: 20200121142718
Implantable Pulse Generator Implant Date: 20190904

## 2018-08-29 LAB — CUP PACEART REMOTE DEVICE CHECK
Date Time Interrogation Session: 20191212231030
Implantable Pulse Generator Implant Date: 20190904

## 2018-08-30 ENCOUNTER — Other Ambulatory Visit: Payer: Self-pay | Admitting: Cardiovascular Disease

## 2018-09-01 ENCOUNTER — Emergency Department (HOSPITAL_COMMUNITY): Payer: Medicare HMO

## 2018-09-01 ENCOUNTER — Other Ambulatory Visit: Payer: Self-pay

## 2018-09-01 ENCOUNTER — Inpatient Hospital Stay (HOSPITAL_COMMUNITY)
Admission: EM | Admit: 2018-09-01 | Discharge: 2018-09-03 | DRG: 871 | Disposition: A | Discharge status: Home / Self Care | Payer: Medicare HMO | Attending: Internal Medicine | Admitting: Internal Medicine

## 2018-09-01 ENCOUNTER — Encounter (HOSPITAL_COMMUNITY): Payer: Self-pay | Admitting: Emergency Medicine

## 2018-09-01 DIAGNOSIS — N390 Urinary tract infection, site not specified: Secondary | ICD-10-CM | POA: Diagnosis present

## 2018-09-01 DIAGNOSIS — Z87891 Personal history of nicotine dependence: Secondary | ICD-10-CM

## 2018-09-01 DIAGNOSIS — R6520 Severe sepsis without septic shock: Secondary | ICD-10-CM

## 2018-09-01 DIAGNOSIS — E118 Type 2 diabetes mellitus with unspecified complications: Secondary | ICD-10-CM | POA: Diagnosis not present

## 2018-09-01 DIAGNOSIS — Z882 Allergy status to sulfonamides status: Secondary | ICD-10-CM

## 2018-09-01 DIAGNOSIS — Z961 Presence of intraocular lens: Secondary | ICD-10-CM | POA: Diagnosis not present

## 2018-09-01 DIAGNOSIS — R0689 Other abnormalities of breathing: Secondary | ICD-10-CM | POA: Diagnosis not present

## 2018-09-01 DIAGNOSIS — Z91030 Bee allergy status: Secondary | ICD-10-CM

## 2018-09-01 DIAGNOSIS — D539 Nutritional anemia, unspecified: Secondary | ICD-10-CM | POA: Diagnosis present

## 2018-09-01 DIAGNOSIS — A419 Sepsis, unspecified organism: Secondary | ICD-10-CM | POA: Diagnosis not present

## 2018-09-01 DIAGNOSIS — Z888 Allergy status to other drugs, medicaments and biological substances status: Secondary | ICD-10-CM | POA: Diagnosis not present

## 2018-09-01 DIAGNOSIS — E119 Type 2 diabetes mellitus without complications: Secondary | ICD-10-CM | POA: Diagnosis not present

## 2018-09-01 DIAGNOSIS — Z79899 Other long term (current) drug therapy: Secondary | ICD-10-CM

## 2018-09-01 DIAGNOSIS — K52831 Collagenous colitis: Secondary | ICD-10-CM | POA: Diagnosis not present

## 2018-09-01 DIAGNOSIS — Z9841 Cataract extraction status, right eye: Secondary | ICD-10-CM | POA: Diagnosis not present

## 2018-09-01 DIAGNOSIS — Z833 Family history of diabetes mellitus: Secondary | ICD-10-CM | POA: Diagnosis not present

## 2018-09-01 DIAGNOSIS — N3000 Acute cystitis without hematuria: Secondary | ICD-10-CM

## 2018-09-01 DIAGNOSIS — R6521 Severe sepsis with septic shock: Secondary | ICD-10-CM | POA: Diagnosis not present

## 2018-09-01 DIAGNOSIS — Z794 Long term (current) use of insulin: Secondary | ICD-10-CM | POA: Diagnosis not present

## 2018-09-01 DIAGNOSIS — R110 Nausea: Secondary | ICD-10-CM | POA: Diagnosis not present

## 2018-09-01 DIAGNOSIS — Z9842 Cataract extraction status, left eye: Secondary | ICD-10-CM

## 2018-09-01 DIAGNOSIS — J9811 Atelectasis: Secondary | ICD-10-CM | POA: Diagnosis not present

## 2018-09-01 DIAGNOSIS — Z7952 Long term (current) use of systemic steroids: Secondary | ICD-10-CM

## 2018-09-01 DIAGNOSIS — C91Z0 Other lymphoid leukemia not having achieved remission: Secondary | ICD-10-CM | POA: Diagnosis not present

## 2018-09-01 DIAGNOSIS — Z7982 Long term (current) use of aspirin: Secondary | ICD-10-CM | POA: Diagnosis not present

## 2018-09-01 DIAGNOSIS — Z87440 Personal history of urinary (tract) infections: Secondary | ICD-10-CM

## 2018-09-01 DIAGNOSIS — R000 Tachycardia, unspecified: Secondary | ICD-10-CM | POA: Diagnosis not present

## 2018-09-01 DIAGNOSIS — L89152 Pressure ulcer of sacral region, stage 2: Secondary | ICD-10-CM | POA: Diagnosis present

## 2018-09-01 DIAGNOSIS — L8990 Pressure ulcer of unspecified site, unspecified stage: Secondary | ICD-10-CM

## 2018-09-01 DIAGNOSIS — G5603 Carpal tunnel syndrome, bilateral upper limbs: Secondary | ICD-10-CM | POA: Diagnosis present

## 2018-09-01 DIAGNOSIS — R0902 Hypoxemia: Secondary | ICD-10-CM | POA: Diagnosis not present

## 2018-09-01 DIAGNOSIS — R509 Fever, unspecified: Secondary | ICD-10-CM | POA: Diagnosis present

## 2018-09-01 LAB — CBC
HCT: 21.2 % — ABNORMAL LOW (ref 39.0–52.0)
Hemoglobin: 6.7 g/dL — CL (ref 13.0–17.0)
MCH: 40.1 pg — AB (ref 26.0–34.0)
MCHC: 31.6 g/dL (ref 30.0–36.0)
MCV: 126.9 fL — ABNORMAL HIGH (ref 80.0–100.0)
Platelets: 270 10*3/uL (ref 150–400)
RBC: 1.67 MIL/uL — ABNORMAL LOW (ref 4.22–5.81)
RDW: 16.5 % — ABNORMAL HIGH (ref 11.5–15.5)
WBC: 12.9 10*3/uL — ABNORMAL HIGH (ref 4.0–10.5)
nRBC: 0 % (ref 0.0–0.2)

## 2018-09-01 LAB — URINALYSIS, ROUTINE W REFLEX MICROSCOPIC
Bilirubin Urine: NEGATIVE
Glucose, UA: 50 mg/dL — AB
Ketones, ur: NEGATIVE mg/dL
Nitrite: POSITIVE — AB
Protein, ur: 100 mg/dL — AB
RBC / HPF: >50 RBC/hpf — ABNORMAL HIGH (ref 0–5)
Specific Gravity, Urine: 1.02 (ref 1.005–1.030)
WBC, UA: >50 WBC/hpf — ABNORMAL HIGH (ref 0–5)
pH: 5 (ref 5.0–8.0)

## 2018-09-01 LAB — COMPREHENSIVE METABOLIC PANEL
ALK PHOS: 68 U/L (ref 38–126)
ALT: 25 U/L (ref 0–44)
ANION GAP: 6 (ref 5–15)
AST: 21 U/L (ref 15–41)
Albumin: 3.8 g/dL (ref 3.5–5.0)
BUN: 19 mg/dL (ref 8–23)
CO2: 24 mmol/L (ref 22–32)
Calcium: 8.6 mg/dL — ABNORMAL LOW (ref 8.9–10.3)
Chloride: 108 mmol/L (ref 98–111)
Creatinine, Ser: 0.58 mg/dL — ABNORMAL LOW (ref 0.61–1.24)
GFR calc Af Amer: >60 mL/min (ref >60)
GFR calc non Af Amer: >60 mL/min (ref >60)
Glucose, Bld: 156 mg/dL — ABNORMAL HIGH (ref 70–99)
Potassium: 4 mmol/L (ref 3.5–5.1)
Sodium: 138 mmol/L (ref 135–145)
Total Bilirubin: 0.9 mg/dL (ref 0.3–1.2)
Total Protein: 6.5 g/dL (ref 6.5–8.1)

## 2018-09-01 LAB — CBG MONITORING, ED
Glucose-Capillary: 137 mg/dL — ABNORMAL HIGH (ref 70–99)
Glucose-Capillary: 133 mg/dL — ABNORMAL HIGH (ref 70–99)

## 2018-09-01 LAB — BASIC METABOLIC PANEL
Anion gap: 3 — ABNORMAL LOW (ref 5–15)
BUN: 16 mg/dL (ref 8–23)
CO2: 23 mmol/L (ref 22–32)
Calcium: 8.1 mg/dL — ABNORMAL LOW (ref 8.9–10.3)
Chloride: 114 mmol/L — ABNORMAL HIGH (ref 98–111)
Creatinine, Ser: 0.48 mg/dL — ABNORMAL LOW (ref 0.61–1.24)
GFR calc Af Amer: >60 mL/min (ref >60)
GFR calc non Af Amer: >60 mL/min (ref >60)
GLUCOSE: 131 mg/dL — AB (ref 70–99)
Potassium: 3.7 mmol/L (ref 3.5–5.1)
Sodium: 140 mmol/L (ref 135–145)

## 2018-09-01 LAB — LACTIC ACID, PLASMA
Lactic Acid, Venous: 2.1 mmol/L (ref 0.5–1.9)
Lactic Acid, Venous: 1.4 mmol/L (ref 0.5–1.9)
Lactic Acid, Venous: 1.1 mmol/L (ref 0.5–1.9)

## 2018-09-01 LAB — APTT: aPTT: 39 seconds — ABNORMAL HIGH (ref 24–36)

## 2018-09-01 LAB — CBC WITH DIFFERENTIAL/PLATELET
Abs Immature Granulocytes: 0.1 10*3/uL — ABNORMAL HIGH (ref 0.00–0.07)
Basophils Absolute: 0 10*3/uL (ref 0.0–0.1)
Basophils Relative: 0 %
Eosinophils Absolute: 0 10*3/uL (ref 0.0–0.5)
Eosinophils Relative: 0 %
HCT: 24.6 % — ABNORMAL LOW (ref 39.0–52.0)
Hemoglobin: 8.1 g/dL — ABNORMAL LOW (ref 13.0–17.0)
IMMATURE GRANULOCYTES: 1 %
Lymphocytes Relative: 21 %
Lymphs Abs: 3.1 10*3/uL (ref 0.7–4.0)
MCH: 42.2 pg — ABNORMAL HIGH (ref 26.0–34.0)
MCHC: 32.9 g/dL (ref 30.0–36.0)
MCV: 128.1 fL — ABNORMAL HIGH (ref 80.0–100.0)
Monocytes Absolute: 1.3 10*3/uL — ABNORMAL HIGH (ref 0.1–1.0)
Monocytes Relative: 9 %
NRBC: 0 % (ref 0.0–0.2)
Neutro Abs: 10.5 10*3/uL — ABNORMAL HIGH (ref 1.7–7.7)
Neutrophils Relative %: 69 %
PLATELETS: 301 10*3/uL (ref 150–400)
RBC: 1.92 MIL/uL — ABNORMAL LOW (ref 4.22–5.81)
RDW: 16.4 % — ABNORMAL HIGH (ref 11.5–15.5)
WBC: 15 10*3/uL — ABNORMAL HIGH (ref 4.0–10.5)

## 2018-09-01 LAB — GLUCOSE, CAPILLARY
Glucose-Capillary: 181 mg/dL — ABNORMAL HIGH (ref 70–99)
Glucose-Capillary: 285 mg/dL — ABNORMAL HIGH (ref 70–99)

## 2018-09-01 LAB — HEMOGLOBIN AND HEMATOCRIT, BLOOD
HCT: 25.5 % — ABNORMAL LOW (ref 39.0–52.0)
Hemoglobin: 8.2 g/dL — ABNORMAL LOW (ref 13.0–17.0)

## 2018-09-01 LAB — PREPARE RBC (CROSSMATCH)

## 2018-09-01 LAB — ABO/RH: ABO/RH(D): O POS

## 2018-09-01 LAB — PROTIME-INR
INR: 1.21
Prothrombin Time: 15.1 seconds (ref 11.4–15.2)

## 2018-09-01 LAB — PROCALCITONIN: Procalcitonin: <0.1 ng/mL

## 2018-09-01 IMAGING — CR DG CHEST 2V
2 series · 2 of 2 positions shown · non-contrast
Comparison: None.

CLINICAL DATA: Fever

EXAM:
CHEST - 2 VIEW

[w chest lat]
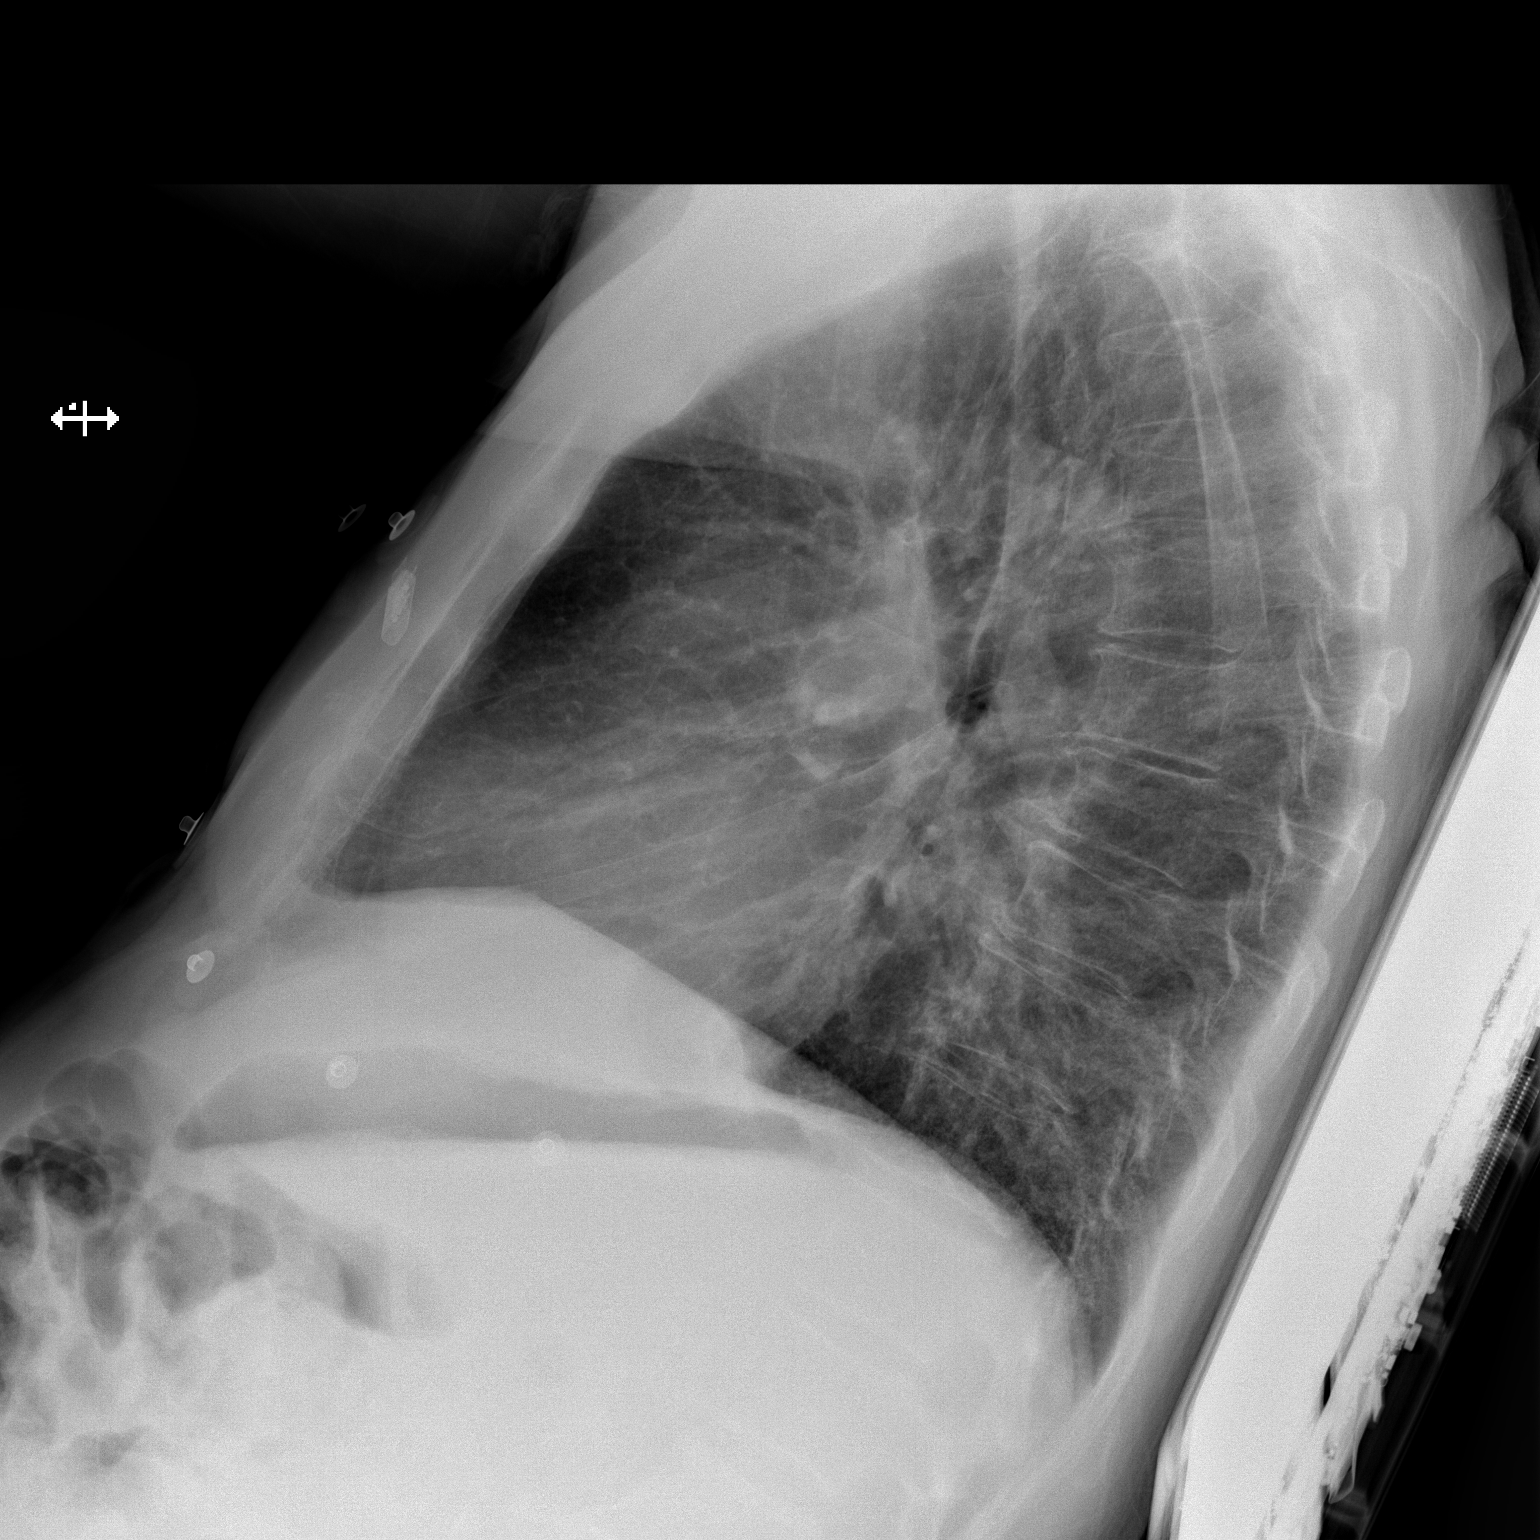

[x chest ap]
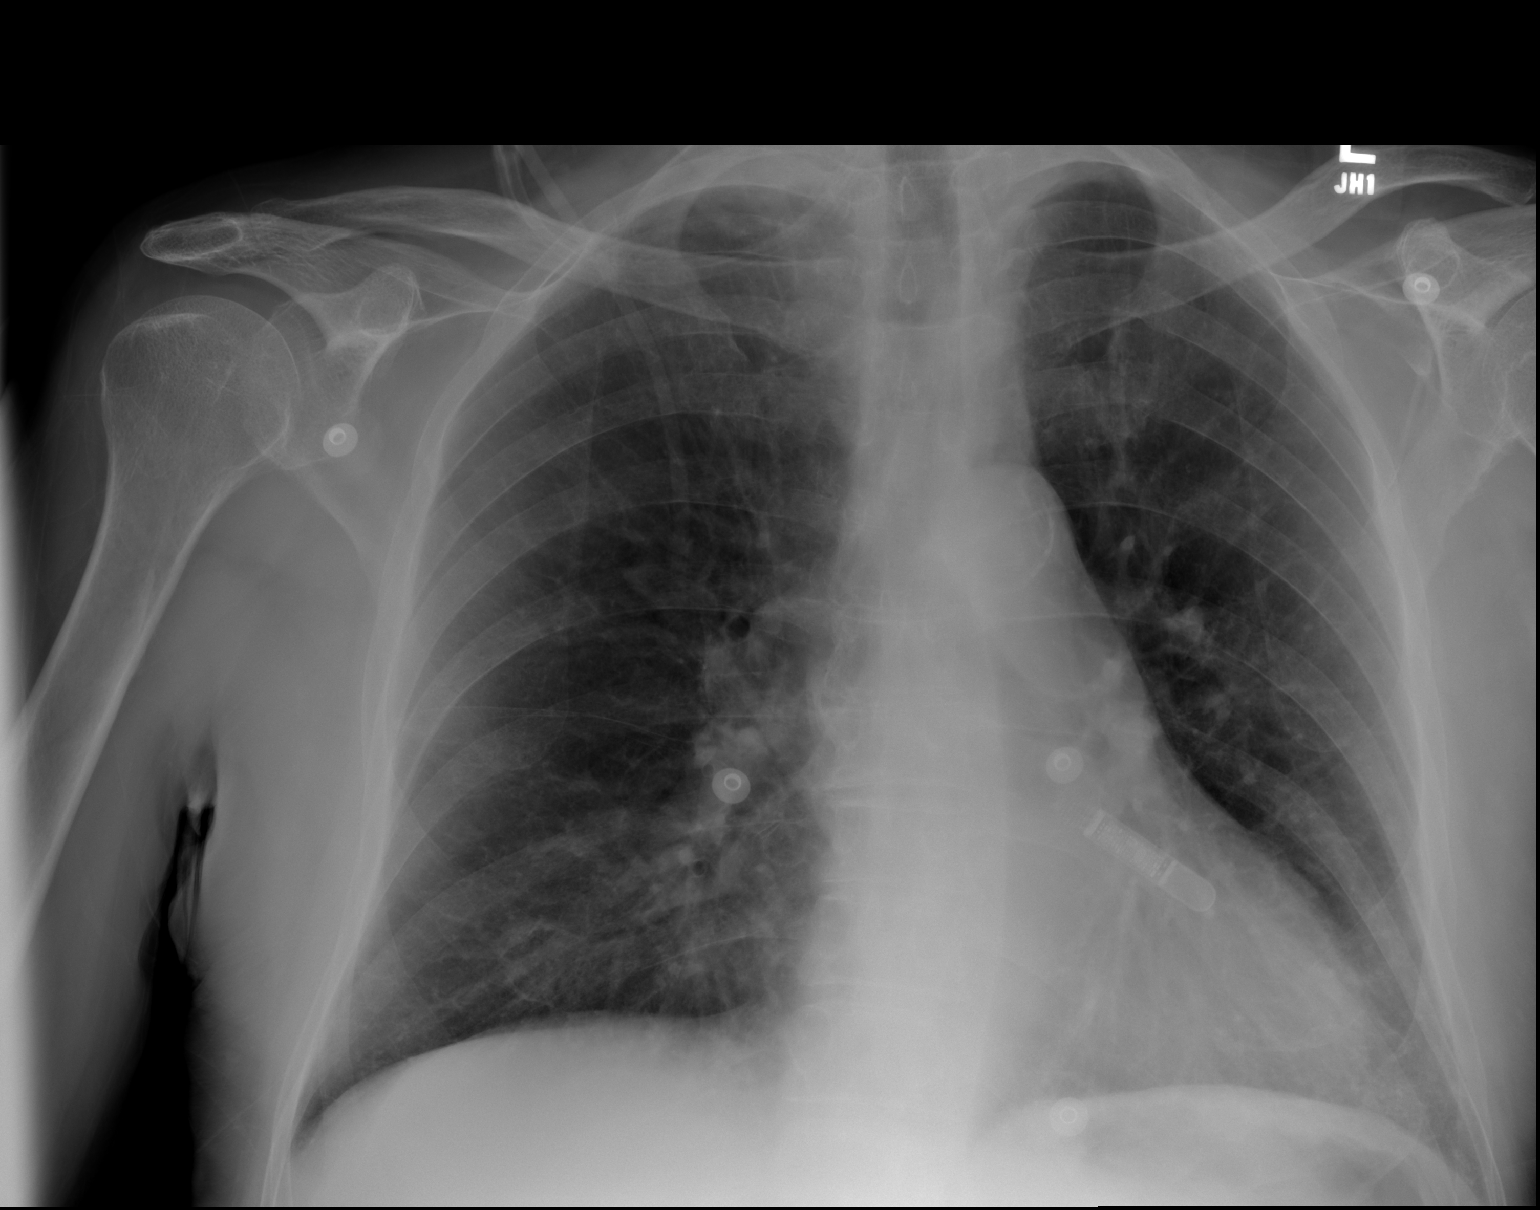

[2 of 2 positions shown; findings below may reference images not displayed]

FINDINGS: Loop recording device projects over the normal sized cardiac
silhouette. There is aortic atherosclerosis at the arch without
aneurysmal dilatation. Mild infrahilar atelectasis is noted at the
right lung base. No overt pulmonary edema, effusion or pneumothorax.
Tubing projects over the right upper thorax. No acute osseous
abnormality.
IMPRESSION: No active cardiopulmonary disease. Aortic atherosclerosis.

## 2018-09-01 MED ORDER — METHOTREXATE 2.5 MG PO TABS: 30.0000 mg | ORAL_TABLET | ORAL | Status: DC

## 2018-09-01 MED ORDER — ONDANSETRON HCL 4 MG PO TABS: 4.0000 mg | ORAL_TABLET | Freq: Four times a day (QID) | ORAL | Status: DC | PRN

## 2018-09-01 MED ORDER — ACETAMINOPHEN 325 MG PO TABS: 650.0000 mg | ORAL_TABLET | Freq: Four times a day (QID) | ORAL | Status: DC | PRN

## 2018-09-01 MED ORDER — LACTATED RINGERS IV BOLUS (SEPSIS)
1000.0000 mL | Freq: Once | INTRAVENOUS | Status: AC
  Administered 2018-09-01: 1000 mL via INTRAVENOUS

## 2018-09-01 MED ORDER — SODIUM CHLORIDE 0.9 % IV SOLN
INTRAVENOUS | Status: DC
  Administered 2018-09-01: 150 mL/h via INTRAVENOUS

## 2018-09-01 MED ORDER — VANCOMYCIN HCL 10 G IV SOLR
1500.0000 mg | Freq: Once | INTRAVENOUS | Status: AC
  Administered 2018-09-01: 1500 mg via INTRAVENOUS
  Filled 2018-09-01: qty 1500

## 2018-09-01 MED ORDER — DOCUSATE SODIUM 100 MG PO CAPS
100.0000 mg | ORAL_CAPSULE | Freq: Every day | ORAL | Status: DC
  Administered 2018-09-01 – 2018-09-03 (×3): 100 mg via ORAL
  Filled 2018-09-01 (×3): qty 1

## 2018-09-01 MED ORDER — LACTATED RINGERS IV BOLUS (SEPSIS): 250.0000 mL | Freq: Once | INTRAVENOUS | Status: DC

## 2018-09-01 MED ORDER — SODIUM CHLORIDE 0.9% IV SOLUTION
Freq: Once | INTRAVENOUS | Status: AC
  Administered 2018-09-01 (×2): via INTRAVENOUS

## 2018-09-01 MED ORDER — INSULIN GLARGINE 100 UNIT/ML ~~LOC~~ SOLN
14.0000 [IU] | Freq: Every day | SUBCUTANEOUS | Status: DC
  Administered 2018-09-01 – 2018-09-02 (×2): 14 [IU] via SUBCUTANEOUS
  Filled 2018-09-01 (×2): qty 0.14

## 2018-09-01 MED ORDER — SODIUM CHLORIDE 0.9 % IV SOLN
1.0000 g | INTRAVENOUS | Status: DC
  Administered 2018-09-01: 1 g via INTRAVENOUS
  Filled 2018-09-01: qty 10

## 2018-09-01 MED ORDER — VANCOMYCIN HCL IN DEXTROSE 1-5 GM/200ML-% IV SOLN
1000.0000 mg | Freq: Two times a day (BID) | INTRAVENOUS | Status: DC
  Administered 2018-09-01 – 2018-09-02 (×2): 1000 mg via INTRAVENOUS
  Filled 2018-09-01 (×2): qty 200

## 2018-09-01 MED ORDER — HYDROCORTISONE NA SUCCINATE PF 100 MG IJ SOLR
25.0000 mg | Freq: Three times a day (TID) | INTRAMUSCULAR | Status: DC
  Administered 2018-09-01 – 2018-09-03 (×5): 25 mg via INTRAVENOUS
  Filled 2018-09-01 (×6): qty 2

## 2018-09-01 MED ORDER — HYDROCORTISONE NA SUCCINATE PF 100 MG IJ SOLR
50.0000 mg | Freq: Four times a day (QID) | INTRAMUSCULAR | Status: DC
  Administered 2018-09-01: 50 mg via INTRAVENOUS
  Filled 2018-09-01: qty 2

## 2018-09-01 MED ORDER — LACTATED RINGERS IV BOLUS (SEPSIS)
250.0000 mL | Freq: Once | INTRAVENOUS | Status: AC
  Administered 2018-09-01: 250 mL via INTRAVENOUS

## 2018-09-01 MED ORDER — LACTATED RINGERS IV BOLUS (SEPSIS): 1000.0000 mL | Freq: Once | INTRAVENOUS | Status: DC

## 2018-09-01 MED ORDER — SENNOSIDES-DOCUSATE SODIUM 8.6-50 MG PO TABS: 1.0000 | ORAL_TABLET | Freq: Every evening | ORAL | Status: DC | PRN

## 2018-09-01 MED ORDER — ACETAMINOPHEN 650 MG RE SUPP: 650.0000 mg | Freq: Four times a day (QID) | RECTAL | Status: DC | PRN

## 2018-09-01 MED ORDER — SODIUM CHLORIDE 0.9% FLUSH
3.0000 mL | Freq: Once | INTRAVENOUS | Status: AC
  Administered 2018-09-01: 3 mL via INTRAVENOUS

## 2018-09-01 MED ORDER — ONDANSETRON HCL 4 MG/2ML IJ SOLN: 4.0000 mg | Freq: Four times a day (QID) | INTRAMUSCULAR | Status: DC | PRN

## 2018-09-01 MED ORDER — ZOLPIDEM TARTRATE 5 MG PO TABS: 5.0000 mg | ORAL_TABLET | Freq: Every evening | ORAL | Status: DC | PRN

## 2018-09-01 MED ORDER — SODIUM CHLORIDE 0.9 % IV SOLN
2.0000 g | INTRAVENOUS | Status: DC
  Administered 2018-09-02 – 2018-09-03 (×2): 2 g via INTRAVENOUS
  Filled 2018-09-01 (×2): qty 2

## 2018-09-01 MED ORDER — ENOXAPARIN SODIUM 40 MG/0.4ML ~~LOC~~ SOLN
40.0000 mg | SUBCUTANEOUS | Status: DC
  Filled 2018-09-01: qty 0.4

## 2018-09-01 MED ORDER — INSULIN ASPART 100 UNIT/ML ~~LOC~~ SOLN
0.0000 [IU] | Freq: Three times a day (TID) | SUBCUTANEOUS | Status: DC
  Administered 2018-09-01: 1 [IU] via SUBCUTANEOUS
  Administered 2018-09-01: 2 [IU] via SUBCUTANEOUS
  Administered 2018-09-01 – 2018-09-02 (×2): 1 [IU] via SUBCUTANEOUS
  Administered 2018-09-02 (×2): 3 [IU] via SUBCUTANEOUS
  Filled 2018-09-01 (×2): qty 1

## 2018-09-01 NOTE — Progress Notes (Signed)
Pharmacy Antibiotic Note  Tommy Peterson is a 78 y.o. male admitted on 09/01/2018 with UTI.  Pharmacy has been consulted for vancomycin dosing.  Plan: Rocephin 1 Gm IV q24h (MD) Vancomycin 1500 mg x1 then 1 Gm IV q12h for est AUC = 485 Goal AUC = 400-550 F/u scr/cultures/levels  Height: 6\' 2"  (188 cm) Weight: 162 lb (73.5 kg) IBW/kg (Calculated) : 82.2  Temp (24hrs), Avg:101.3 F (38.5 C), Min:101.3 F (38.5 C), Max:101.3 F (38.5 C)  Recent Labs  Lab 09/01/18 0227  WBC 15.0*  CREATININE 0.58*  LATICACIDVEN 2.1*    Estimated Creatinine Clearance: 79.1 mL/min (A) (by C-G formula based on SCr of 0.58 mg/dL (L)).    Allergies  Allergen Reactions  . Bee Venom Swelling  . Metformin And Related Nausea And Vomiting  . Sulfamethoxazole-Trimethoprim Nausea And Vomiting    Antimicrobials this admission: 1/26 rocephin >>  1/26 vancomycin >>   Dose adjustments this admission:   Microbiology results:  BCx:   UCx:    Sputum:    MRSA PCR:   Thank you for allowing pharmacy to be a part of this patient's care.  Dorrene German 09/01/2018 4:27 AM

## 2018-09-01 NOTE — H&P (Addendum)
History and Physical    AMR STURTEVANT QXI:[id number] DOB: 1941-05-10 DOA: 09/01/2018  Referring MD/NP/PA:   PCP: [facility]   Patient coming from:  The patient is coming from home.  At baseline, pt is independent for most of ADL.        Chief Complaint: Fever, chills, dysuria  HPI: Tommy Peterson is a 78 y.o. 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  Patient states that his symptoms started last night, including fever of 102.3, chills, dysuria, burning on urination and increased urinary frequency.  No gross hematuria.  No flank pain.  Patient denies any chest pain, shortness of breath and cough.  No runny nose or sore throat.  No nausea, vomiting, diarrhea or abdominal pain.  Patient states that he has history of collagen colitis, and was taking Entocort for 17 years.  He stopped taking Entocort 3 months ago because he no longer has diarrhea, instead he has intermittent constipation recently.  ED Course: pt was found to have positive urinalysis (turbid appearance, large amount of leukocyte, positive nitrite, rare bacteria, WBC> 50), WBC 15, lactic acid 2.1, INR 1.21, electrolytes renal function okay, temperature one 1.3, tachycardia, tachypnea, oxygen saturation 99% on room air.  Patient is admitted to telemetry bed as inpatient.  Review of Systems:   General: Has fevers, chills, no body weight gain, has fatigue HEENT: no blurry vision, hearing changes or sore throat Respiratory: no dyspnea, coughing, wheezing CV: no chest pain, no palpitations GI: no nausea, vomiting, abdominal pain, diarrhea, has constipation GU: Has dysuria, burning on urination, increased urinary frequency, no hematuria  Ext: no  leg edema Neuro: no unilateral weakness, numbness, or tingling, no vision change or hearing loss Skin: no rash, no skin tear. MSK: No muscle spasm, no deformity, no limitation of range of movement in spin Heme: No easy bruising.  Travel history: No recent long distant travel.  Allergy:  Allergies  Allergen Reactions  . Bee Venom Swelling  . Metformin And Related Nausea And Vomiting  . Sulfamethoxazole-Trimethoprim Nausea And Vomiting    Past Medical History:  Diagnosis Date  . Acute respiratory failure ([place])   . BPH (benign prostatic hyperplasia)   . Carpal tunnel syndrome on both sides   . Chronic diarrhea    intermittant due to chronic collagenous colitis  . Chronic large granular lymphocytic leukemia ([place]) primary hemotologist-  [name]. 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 Collagenous colitis    chronic--- intermittant between diarrahea and constipation  . ED (erectile dysfunction)   . HCAP (healthcare-associated pneumonia)   . History of adenomatous polyp of colon   . History of Clostridium difficile colitis 05/2016  . History of pneumonia 11/08/2017   CAP -- [facility]---  12-19-2017  per pt no residual symptoms  . History of SCC (squamous cell carcinoma) of skin   . History of sepsis    11-20-2017 severe sepsis due to UTI;  10/ 2017sepsis due to c-diff colitis  . Leucocytosis    chronic  . Lower urinary tract symptoms (LUTS)   .  Macrocytic anemia    since 02/ 2015  . MGUS (monoclonal gammopathy of unknown significance)    hemotologist-  [name] @ [facility]  . Monoclonal paraproteinemia   . Neuropathy, peripheral   . OA (osteoarthritis)   . Prostatic stone   . Rash of face    RIGHT SIDE  . Raynaud's syndrome   . Thin skin    fragile  .  Transfusion-dependent anemia since 10/ 2017   last transfusion PRBCs 07-19-2017  per hemologist note dated 10-25-2017  . Type 2 diabetes mellitus treated with insulin ([place])    followed by [name] (pcp)  . Urinary hesitancy   . Wears contact lenses    LEFT EYE ONLY    Past Surgical History:  Procedure Laterality Date  . BONE MARROW BIOPSY Right 05-24-2016;  08-20-2014  . CARDIOVASCULAR STRESS TEST  11-26-2012   [name]  @ [facility] ([place]   normal nuclear study w/ no ischemia/  normal LV function and wall motion , ef 60% (find in care everywhere,epic)  . CATARACT EXTRACTION W/ INTRAOCULAR LENS  IMPLANT, BILATERAL  2015  . CYSTOSCOPY WITH LITHOLAPAXY N/A 12/25/2017   Procedure: CYSTOSCOPY WITH LITHOLAPAXY AND FULGERATION;  Surgeon: [name], MD;  Location: [facility];  Service: Urology;  Laterality: N/A;  . INGUINAL HERNIA REPAIR Right 1980s  . LOOP RECORDER INSERTION N/A 04/10/2018   Procedure: LOOP RECORDER INSERTION;  Surgeon: [name], MD;  Location: [facility];  Service: Cardiovascular;  Laterality: N/A;  . PENILE PROSTHESIS IMPLANT  12-02-2015   dr [name] @ [facility] in [place]  . SHOULDER SURGERY Left 08/2016  . TONSILLECTOMY  child  . TRANSURETHRAL RESECTION OF PROSTATE  2009   AND REPAIR RECURRENT RIGHT INGUINAL HERNIA    Social History:  reports that he quit smoking about 22 years ago. His smoking use included cigarettes. He quit after 20.00 years of use. He has never used smokeless tobacco. He reports current alcohol use. He reports that he does not use drugs.  Family History:  Family History  Problem Relation Age of Onset  . Diabetes Mother   . Heart failure Mother      Prior to Admission medications   Medication Sig Start Date End Date Taking? Authorizing Provider  acetaminophen (TYLENOL) 325 MG tablet Take 2 tablets (650 mg total) by mouth every 6 (six) hours as needed for mild pain (or Fever >/= 101).  11/24/17   Elgergawy, Silver Huguenin, MD  Ascorbic Acid (VITAMIN C) 1000 MG tablet Take 1,000 mg by mouth daily.    [provider]  aspirin 325 MG EC tablet Take 650 mg by mouth every 6 (six) hours as needed for pain.    [provider]  budesonide (ENTOCORT EC) 3 MG 24 hr capsule Take 3 mg by mouth daily after breakfast.     [provider]  Insulin Glargine-Lixisenatide (SOLIQUA) 100-33 UNT-MCG/ML SOPN Inject 28 Units into the skin at bedtime.     [provider]  methotrexate (RHEUMATREX) 2.5 MG tablet Take 30 mg by mouth every Tuesday.  07/19/17   [provider]  pioglitazone (ACTOS) 15 MG tablet Take 15 mg by mouth every morning.  10/18/17   [provider]  vitamin B-12 (CYANOCOBALAMIN) 500 MCG tablet Take 1 tablet (500 mcg total) by mouth daily. 11/24/17   Elgergawy, Silver Huguenin, MD    Physical Exam: Vitals:   09/01/18 0300 09/01/18 0330 09/01/18 0400 09/01/18  0430  BP: 119/65 137/60 (!) 139/54 (!) 120/53  Pulse: (!) 105 89 96 (!) 101  Resp: 17 (!) _0 Temp:      TempSrc:      SpO2: 99% 99% 95% 99%  Weight:      Height:       General: Not in acute distress HEENT:       Eyes: PERRL, EOMI, no scleral icterus.       ENT: No discharge from the ears and nose, no pharynx injection, no tonsillar enlargement.        Neck: No JVD, no bruit, no mass felt. Heme: No neck lymph node enlargement. Cardiac: S1/S2, RRR, No murmurs, No gallops or rubs. Respiratory: No rales, wheezing, rhonchi or rubs. GI: Soft, nondistended, nontender, no rebound pain, no organomegaly, BS present. GU: No hematuria Ext: No pitting leg edema bilaterally. 2+DP/PT pulse bilaterally. Musculoskeletal: No joint deformities, No joint redness or warmth, no limitation of ROM in spin. Skin: No rashes.  Neuro: Alert, oriented X3, cranial nerves II-XII grossly intact, moves all extremities normally.   Psych: Patient is not psychotic, no suicidal or hemocidal  ideation.  Labs on Admission: I have personally reviewed following labs and imaging studies  CBC: Recent Labs  Lab 09/01/18 0227  WBC 15.0*  NEUTROABS 10.5*  HGB 8.1*  HCT 24.6*  MCV 128.1*  PLT 818   Basic Metabolic Panel: Recent Labs  Lab 09/01/18 0227  NA 138  K 4.0  CL 108  CO2 24  GLUCOSE 156*  BUN 19  CREATININE 0.58*  CALCIUM 8.6*   GFR: Estimated Creatinine Clearance: 79.1 mL/min (A) (by C-G formula based on SCr of 0.58 mg/dL (L)). Liver Function Tests: Recent Labs  Lab 09/01/18 0227  AST 21  ALT 25  ALKPHOS 68  BILITOT 0.9  PROT 6.5  ALBUMIN 3.8   No results for input(s): LIPASE, AMYLASE in the last 168 hours. No results for input(s): AMMONIA in the last 168 hours. Coagulation Profile: Recent Labs  Lab 09/01/18 0227  INR 1.21   Cardiac Enzymes: No results for input(s): CKTOTAL, CKMB, CKMBINDEX, TROPONINI in the last 168 hours. BNP (last 3 results) No results for input(s): PROBNP in the last 8760 hours. HbA1C: No results for input(s): HGBA1C in the last 72 hours. CBG: No results for input(s): GLUCAP in the last 168 hours. Lipid Profile: No results for input(s): CHOL, HDL, LDLCALC, TRIG, CHOLHDL, LDLDIRECT in the last 72 hours. Thyroid Function Tests: No results for input(s): TSH, T4TOTAL, FREET4, T3FREE, THYROIDAB in the last 72 hours. Anemia Panel: No results for input(s): VITAMINB12, FOLATE, FERRITIN, TIBC, IRON, RETICCTPCT in the last 72 hours. Urine analysis:    Component Value Date/Time   COLORURINE YELLOW 09/01/2018 0325   APPEARANCEUR TURBID (A) 09/01/2018 0325   LABSPEC 1.020 09/01/2018 0325   PHURINE 5.0 09/01/2018 0325   GLUCOSEU 50 (A) 09/01/2018 0325   HGBUR LARGE (A) 09/01/2018 0325   BILIRUBINUR NEGATIVE 09/01/2018 0325   BILIRUBINUR negative 10/29/2017 1428   KETONESUR NEGATIVE 09/01/2018 0325   PROTEINUR 100 (A) 09/01/2018 0325   UROBILINOGEN 0.2 10/29/2017 1428   NITRITE POSITIVE (A) 09/01/2018 0325   LEUKOCYTESUR  LARGE (A) 09/01/2018 0325   Sepsis Labs: _1 (procalcitonin:4,lacticidven:4) )No results found for this or any previous visit (from the past 240 hour(s)).   Radiological Exams on Admission: Dg Chest 2 View  Result Date: 09/01/2018 CLINICAL DATA:  Fever EXAM: CHEST - 2 VIEW COMPARISON:  None. FINDINGS: Loop recording device projects  over the normal sized cardiac silhouette. There is aortic atherosclerosis at the arch without aneurysmal dilatation. Mild infrahilar atelectasis is noted at the right lung base. No overt pulmonary edema, effusion or pneumothorax. Tubing projects over the right upper thorax. No acute osseous abnormality. IMPRESSION: No active cardiopulmonary disease. Aortic atherosclerosis. Electronically Signed   By: Ashley Royalty M.D.   On: 09/01/2018 01:53     EKG:  Not done in ED, will get one.   Assessment/Plan Principal Problem:   UTI (urinary tract infection) Active Problems:   Colitis, collagenous   Large granular lymphocytic leukemia (HCC)   Diabetes mellitus with complication (HCC)   Macrocytic anemia   Sepsis ([place])  Addendum: his Hgb dropped from 8.1-->6.7. possibly due to dilution -will get FOBT -d/c Lovenox -type screen -transfuse 1U of blood.  Sepsis due to UTI: Patient has positive urinalysis for UTI.  No flank pain, less likely to have pyelonephritis.  Patient meets criteria for sepsis with leukocytosis, tachycardia, tachypnea and fever.  Lactic acid is elevated.  Currently hemodynamically stable.  Patient had history of positive urine culture for Staphylococcus epididymis which was resistant to oxacillin.  This is a recurrent issue.  Patient has septic shock due to UTI.   - Admit to telemetry bed as inpt -  Ceftriaxone by IV was started in ED -  Will add vancomycin per pharmacist consult - Follow up results of urine and blood cx and amend antibiotic regimen if needed per sensitivity results - prn Zofran for nausea - will get Procalcitonin and trend  lactic acid levels per sepsis protocol. - IVF: 3.25 L of NS bolus in ED, followed by 150 cc/h-->please slow down the IVF rate when sepsis is controlled.  Hx of Colitis, collagenous: This issue has resolved.  Patient has intermittent constipation.  Patient stopped taking Entocort 7-monthago. -prn laxatives for constipation -Solu-Cortef 50 mg every 8 hour as stress dose  Large granular lymphocytic leukemia and macrocytic anemia: [place]. Hgb stable.  Hemoglobin was 7.4 on 07/30/2018--> 8.1 today. -continue methotrexate every Tuesday (patient is expected to improve by Tuesday)  Diabetes mellitus with complication ([facility]: Last A1c 8.1 on 02/18/2018, poorly controled. Patient is taking Actos, Soliqua at home -will decrease glargine insulin dose from 28-->14 U daily  -SSI   Inpatient status:  # Patient requires inpatient status due to high intensity of service, high risk for further deterioration and high frequency of surveillance required.  I certify that at the point of admission it is my clinical judgment that the patient will require inpatient hospital care spanning beyond 2 midnights from the point of admission.  . 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 Now patient has presenting with typical symptoms of UTI, sepsis due to UTI . The worrisome physical exam findings include high fever . The initial radiographic and laboratory data are worrisome because of leukocytosis, elevated lactic acid, sepsis, positive urinalysis . Current medical needs: please see my assessment and plan . Predictability of an adverse outcome (risk): Patient has multiple comorbidities, including leukemia on methotrexate, is immunosuppressed.   Patient has history of septic shock due to UTI, now presents with recurrent UTI, at high risk of deteriorating and developing septic shock.  Patient will need to be treated in hospital  for at least 2 days.    DVT ppx: SQ Lovenox-->changed to SCD Code Status: Full code Family Communication: None at bed side.   Disposition Plan:  Anticipate discharge back to previous home environment Consults called:  none Admission status:  Inpatient/tele      Date of Service 09/01/2018    [facility]   If 7PM-7AM, please contact night-coverage [email or web address] Password [email or web address] 09/01/2018, 5:05 AM

## 2018-09-01 NOTE — ED Notes (Signed)
ED TO INPATIENT HANDOFF REPORT  Name/Age/Gender Tommy Peterson 78 y.o. male  Code Status    Code Status Orders  (From admission, onward)         Start     Ordered   09/01/18 0445  Full code  Continuous     09/01/18 0445        Code Status History    Date Active Date Inactive Code Status Order ID Comments User Context   07/28/2018 2309 07/30/2018 1538 Full Code [id number]  Norval Morton, MD ED   11/20/2017 0625 11/24/2017 1827 Full Code [id number]  Etta Quill, DO ED   12/26/2016 1700 12/27/2016 2028 Full Code [id number]  Dionne Milo, NP Inpatient   05/27/2016 2320 06/01/2016 1832 Full Code [id number]  Phillips Grout, MD Inpatient   05/04/2016 2241 05/06/2016 1242 Full Code [id number]  Jani Gravel, MD Inpatient      Home/SNF/Other Home  Chief Complaint uti; fever  Level of Care/Admitting Diagnosis ED Disposition    ED Disposition Condition [place]: [facility] [[id number]]  Level of Care: Telemetry [5]  Admit to tele based on following criteria: Other see comments  Comments: a fib hx  Diagnosis: UTI (urinary tract infection) [169678]  Admitting Physician: Ivor Costa [[id number]]  Attending Physician: Ivor Costa [phone]  Estimated length of stay: past midnight tomorrow  Certification:: I certify this patient will need inpatient services for at least 2 midnights  PT Class (Do Not Modify): Inpatient [101]  PT Acc Code (Do Not Modify): Private [1]       Medical History Past Medical History:  Diagnosis Date  . Acute respiratory failure ([place])   . BPH (benign prostatic hyperplasia)   . Carpal tunnel syndrome on both sides   . Chronic diarrhea    intermittant due to chronic collagenous colitis  . Chronic large granular lymphocytic leukemia ([place]) primary hemotologist-  [name]. 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 Collagenous colitis    chronic--- intermittant between diarrahea and constipation  . ED (erectile dysfunction)   . HCAP (healthcare-associated pneumonia)   . History of adenomatous polyp of colon   . History of Clostridium difficile colitis 05/2016  . History of pneumonia 11/08/2017   CAP -- LLL---  12-19-2017  per pt no residual symptoms  . History of SCC (squamous cell carcinoma) of skin   . History of sepsis    11-20-2017 severe sepsis due to UTI;  10/ 2017sepsis due to c-diff colitis  . Leucocytosis    chronic  . Lower urinary tract symptoms (LUTS)   . Macrocytic anemia    since 02/ 2015  . MGUS (monoclonal gammopathy of unknown significance)    hemotologist-  [name] @ [facility]  . Monoclonal paraproteinemia   . Neuropathy, peripheral   . OA (osteoarthritis)   . Prostatic stone   . Rash of face    RIGHT SIDE  . Raynaud's syndrome   . Thin skin    fragile  . Transfusion-dependent anemia since 10/ 2017   last transfusion PRBCs 07-19-2017  per hemologist note dated 10-25-2017  . Type 2 diabetes mellitus treated with insulin ([place])    followed by [name] (pcp)  . Urinary hesitancy   . Wears contact lenses  LEFT EYE ONLY    Allergies Allergies  Allergen Reactions  . Bee Venom Swelling  . Metformin And Related Nausea And Vomiting  . Sulfamethoxazole-Trimethoprim Nausea And Vomiting    IV Location/Drains/Wounds Patient Lines/Drains/Airways Status   Active Line/Drains/Airways    Name:   Placement date:   Placement time:   Site:   Days:   Peripheral IV 09/01/18 Left Hand   09/01/18    -    Hand   less than 1   Peripheral IV 09/01/18 Right Wrist   09/01/18    0233    Wrist   less than 1          Labs/Imaging Results for orders placed or performed during the hospital encounter of 09/01/18 (from the past 48 hour(s))  Comprehensive metabolic panel      Status: Abnormal   Collection Time: 09/01/18  2:27 AM  Result Value Ref Range   Sodium 138 135 - 145 mmol/L   Potassium 4.0 3.5 - 5.1 mmol/L   Chloride 108 98 - 111 mmol/L   CO2 24 22 - 32 mmol/L   Glucose, Bld 156 (H) 70 - 99 mg/dL   BUN 19 8 - 23 mg/dL   Creatinine, Ser 0.58 (L) 0.61 - 1.24 mg/dL   Calcium 8.6 (L) 8.9 - 10.3 mg/dL   Total Protein 6.5 6.5 - 8.1 g/dL   Albumin 3.8 3.5 - 5.0 g/dL   AST 21 15 - 41 U/L   ALT 25 0 - 44 U/L   Alkaline Phosphatase 68 38 - 126 U/L   Total Bilirubin 0.9 0.3 - 1.2 mg/dL   GFR calc non Af Amer >60 >60 mL/min   GFR calc Af Amer >60 >60 mL/min   Anion gap 6 5 - 15    Comment: Performed at [facility], [place] [address], [place], [place] [address]  Lactic acid, plasma     Status: Abnormal   Collection Time: 09/01/18  2:27 AM  Result Value Ref Range   Lactic Acid, Venous 2.1 (HH) 0.5 - 1.9 mmol/L    Comment: CRITICAL RESULT CALLED TO, READ BACK BY AND VERIFIED WITH: T,DOSTER AT 0305 ON 09/01/18 BY A,MOHAMED Performed at [facility], [place] [address], [place], [place] [address]   CBC with Differential     Status: Abnormal   Collection Time: 09/01/18  2:27 AM  Result Value Ref Range   WBC 15.0 (H) 4.0 - 10.5 K/uL   RBC 1.92 (L) 4.22 - 5.81 MIL/uL   Hemoglobin 8.1 (L) 13.0 - 17.0 g/dL   HCT 24.6 (L) 39.0 - 52.0 %   MCV 128.1 (H) 80.0 - 100.0 fL   MCH 42.2 (H) 26.0 - 34.0 pg   MCHC 32.9 30.0 - 36.0 g/dL   RDW 16.4 (H) 11.5 - 15.5 %   Platelets 301 150 - 400 K/uL   nRBC 0.0 0.0 - 0.2 %   Neutrophils Relative % 69 %   Neutro Abs 10.5 (H) 1.7 - 7.7 K/uL   Lymphocytes Relative 21 %   Lymphs Abs 3.1 0.7 - 4.0 K/uL   Monocytes Relative 9 %   Monocytes Absolute 1.3 (H) 0.1 - 1.0 K/uL   Eosinophils Relative 0 %   Eosinophils Absolute 0.0 0.0 - 0.5 K/uL   Basophils Relative 0 %   Basophils Absolute 0.0 0.0 - 0.1 K/uL   WBC Morphology MILD LEFT SHIFT (1-5% METAS, OCC MYELO, OCC BANDS)    Immature  Granulocytes 1 %  Abs Immature Granulocytes 0.10 (H) 0.00 - 0.07 K/uL    Comment: Performed at [facility], [place] [address] [place], [place] [address]  Protime-INR     Status: None   Collection Time: 09/01/18  2:27 AM  Result Value Ref Range   Prothrombin Time 15.1 11.4 - 15.2 seconds   INR 1.21     Comment: Performed at [facility], [place] [address], [place], [place] [address]  Urinalysis, Routine w reflex microscopic     Status: Abnormal   Collection Time: 09/01/18  3:25 AM  Result Value Ref Range   Color, Urine YELLOW YELLOW   APPearance TURBID (A) CLEAR   Specific Gravity, Urine 1.020 1.005 - 1.030   pH 5.0 5.0 - 8.0   Glucose, UA 50 (A) NEGATIVE mg/dL   Hgb urine dipstick LARGE (A) NEGATIVE   Bilirubin Urine NEGATIVE NEGATIVE   Ketones, ur NEGATIVE NEGATIVE mg/dL   Protein, ur 100 (A) NEGATIVE mg/dL   Nitrite POSITIVE (A) NEGATIVE   Leukocytes, UA LARGE (A) NEGATIVE   RBC / HPF >50 (H) 0 - 5 RBC/hpf   WBC, UA >50 (H) 0 - 5 WBC/hpf   Bacteria, UA RARE (A) NONE SEEN   WBC Clumps PRESENT    Mucus PRESENT     Comment: Performed at [facility], [place] [address], [place], [place] [address]  Lactic acid, plasma     Status: None   Collection Time: 09/01/18  5:01 AM  Result Value Ref Range   Lactic Acid, Venous 1.4 0.5 - 1.9 mmol/L    Comment: Performed at [facility], [place] [address] [place], [place] [address]  Lactic acid, plasma     Status: None   Collection Time: 09/01/18  6:15 AM  Result Value Ref Range   Lactic Acid, Venous 1.1 0.5 - 1.9 mmol/L    Comment: Performed at [facility], [place] [address], [place], [place] [address]  Procalcitonin     Status: None   Collection Time: 09/01/18  6:15 AM  Result Value Ref Range   Procalcitonin <0.10 ng/mL    Comment:        Interpretation: PCT (Procalcitonin) <= 0.5 ng/mL: Systemic infection (sepsis) is not likely. Local bacterial  infection is possible. (NOTE)       Sepsis PCT Algorithm           Lower Respiratory Tract                                      Infection PCT Algorithm    ----------------------------     ----------------------------         PCT < 0.25 ng/mL                PCT < 0.10 ng/mL         Strongly encourage             Strongly discourage   discontinuation of antibiotics    initiation of antibiotics    ----------------------------     -----------------------------       PCT 0.25 - 0.50 ng/mL            PCT 0.10 - 0.25 ng/mL               OR       >80% decrease in PCT  Discourage initiation of                                            antibiotics      Encourage discontinuation           of antibiotics    ----------------------------     -----------------------------         PCT >= 0.50 ng/mL              PCT 0.26 - 0.50 ng/mL               AND        <80% decrease in PCT             Encourage initiation of                                             antibiotics       Encourage continuation           of antibiotics    ----------------------------     -----------------------------        PCT >= 0.50 ng/mL                  PCT > 0.50 ng/mL               AND         increase in PCT                  Strongly encourage                                      initiation of antibiotics    Strongly encourage escalation           of antibiotics                                     -----------------------------                                           PCT <= 0.25 ng/mL                                                 OR                                        > 80% decrease in PCT                                     Discontinue / Do not initiate  antibiotics Performed at [facility], [place] [address] [place], [place] [address]   Basic metabolic panel     Status: Abnormal   Collection Time: 09/01/18  6:15 AM  Result Value Ref  Range   Sodium 140 135 - 145 mmol/L   Potassium 3.7 3.5 - 5.1 mmol/L   Chloride 114 (H) 98 - 111 mmol/L   CO2 23 22 - 32 mmol/L   Glucose, Bld 131 (H) 70 - 99 mg/dL   BUN 16 8 - 23 mg/dL   Creatinine, Ser 0.48 (L) 0.61 - 1.24 mg/dL   Calcium 8.1 (L) 8.9 - 10.3 mg/dL   GFR calc non Af Amer >60 >60 mL/min   GFR calc Af Amer >60 >60 mL/min   Anion gap 3 (L) 5 - 15    Comment: Performed at [facility], [place] [address] [place], [place] [address]  CBC     Status: Abnormal   Collection Time: 09/01/18  6:15 AM  Result Value Ref Range   WBC 12.9 (H) 4.0 - 10.5 K/uL   RBC 1.67 (L) 4.22 - 5.81 MIL/uL   Hemoglobin 6.7 (LL) 13.0 - 17.0 g/dL    Comment: This critical result has verified and been called to T DOSTER,RN by Marnee Spring on 01 26 2020 at [phone], and has been read back. CRITICAL RESULTS VERIFIED   HCT 21.2 (L) 39.0 - 52.0 %   MCV 126.9 (H) 80.0 - 100.0 fL   MCH 40.1 (H) 26.0 - 34.0 pg   MCHC 31.6 30.0 - 36.0 g/dL   RDW 16.5 (H) 11.5 - 15.5 %   Platelets 270 150 - 400 K/uL   nRBC 0.0 0.0 - 0.2 %    Comment: Performed at [facility], [place] [address] [place], [place] [address]  CBG monitoring, ED     Status: Abnormal   Collection Time: 09/01/18  9:31 AM  Result Value Ref Range   Glucose-Capillary 137 (H) 70 - 99 mg/dL  APTT     Status: Abnormal   Collection Time: 09/01/18  9:36 AM  Result Value Ref Range   aPTT 39 (H) 24 - 36 seconds    Comment:        IF BASELINE aPTT IS ELEVATED, SUGGEST PATIENT RISK ASSESSMENT BE USED TO DETERMINE APPROPRIATE ANTICOAGULANT THERAPY. Performed at [facility], [place] [address] [place], [place] [address]   Type and screen [place]     Status: None (Preliminary result)   Collection Time: 09/01/18  9:36 AM  Result Value Ref Range   ABO/RH(D) O POS    Antibody Screen NEG    Sample Expiration 09/04/2018    Unit Number [id number]    Blood Component Type RED  CELLS,LR    Unit division 00    Status of Unit ALLOCATED    Transfusion Status OK TO TRANSFUSE    Crossmatch Result      Compatible Performed at [facility], [place] [address] [place],  [address]   Prepare RBC     Status: None   Collection Time: 09/01/18  9:36 AM  Result Value Ref Range   Order Confirmation      ORDER PROCESSED BY BLOOD BANK Performed at [facility], [place] [address] [place],  [address]   ABO/Rh     Status: None   Collection Time: 09/01/18  9:40 AM  Result Value Ref Range   ABO/RH(D)  O POS Performed at [facility], [place] [address], [place], [place] [address]   CBG monitoring, ED     Status: Abnormal   Collection Time: 09/01/18 12:05 PM  Result Value Ref Range   Glucose-Capillary 133 (H) 70 - 99 mg/dL   Dg Chest 2 View  Result Date: 09/01/2018 CLINICAL DATA:  Fever EXAM: CHEST - 2 VIEW COMPARISON:  None. FINDINGS: Loop recording device projects over the normal sized cardiac silhouette. There is aortic atherosclerosis at the arch without aneurysmal dilatation. Mild infrahilar atelectasis is noted at the right lung base. No overt pulmonary edema, effusion or pneumothorax. Tubing projects over the right upper thorax. No acute osseous abnormality. IMPRESSION: No active cardiopulmonary disease. Aortic atherosclerosis. Electronically Signed   By: Ashley Royalty M.D.   On: 09/01/2018 01:53   None  Pending Labs Unresulted Labs (From admission, onward)    Start     Ordered   09/02/18 0500  CBC  Tomorrow morning,   R     09/01/18 0741   09/02/18 3825  Basic metabolic panel  Tomorrow morning,   R     09/01/18 0741   09/02/18 0500  Magnesium  Tomorrow morning,   R     09/01/18 0741   09/01/18 0249  Urine culture  ONCE - STAT,   STAT     09/01/18 0251   09/01/18 0129  Culture, blood (Routine x 2)  BLOOD CULTURE X 2,   STAT     09/01/18 0128   Unscheduled  Occult blood card to lab, stool  As  needed,   R     09/01/18 0655          Vitals/Pain Today's Vitals   09/01/18 1000 09/01/18 1109 09/01/18 1159 09/01/18 1310  BP: (!) 114/54 (!) 126/55 (!) 119/42 109/65  Pulse: 76 78 68 71  Resp: 13 13 18 18   Temp:      TempSrc:      SpO2: 96% 98% 100% 97%  Weight:      Height:      PainSc:        Isolation Precautions No active isolations  Medications Medications  insulin aspart (novoLOG) injection 0-9 Units (1 Units Subcutaneous Given 09/01/18 1024)  acetaminophen (TYLENOL) tablet 650 mg (has no administration in time range)    Or  acetaminophen (TYLENOL) suppository 650 mg (has no administration in time range)  ondansetron (ZOFRAN) tablet 4 mg (has no administration in time range)    Or  ondansetron (ZOFRAN) injection 4 mg (has no administration in time range)  senna-docusate (Senokot-S) tablet 1 tablet (has no administration in time range)  zolpidem (AMBIEN) tablet 5 mg (has no administration in time range)  docusate sodium (COLACE) capsule 100 mg (has no administration in time range)  insulin glargine (LANTUS) injection 14 Units (has no administration in time range)  vancomycin (VANCOCIN) IVPB 1000 mg/200 mL premix (has no administration in time range)  hydrocortisone sodium succinate (SOLU-CORTEF) 100 MG injection 25 mg (has no administration in time range)  cefTRIAXone (ROCEPHIN) 2 g in sodium chloride 0.9 % 100 mL IVPB (has no administration in time range)  sodium chloride flush (NS) 0.9 % injection 3 mL (3 mLs Intravenous Given 09/01/18 0232)  lactated ringers bolus 1,000 mL (0 mLs Intravenous Stopped 09/01/18 0419)    And  lactated ringers bolus 250 mL (0 mLs Intravenous Stopped 09/01/18 0557)  lactated ringers bolus 1,000 mL (0 mLs Intravenous Stopped 09/01/18 0502)    And  lactated ringers bolus 1,000 mL (0 mLs Intravenous Stopped 09/01/18 0419)  vancomycin (VANCOCIN) 1,500 mg in sodium chloride 0.9 % 500 mL IVPB (0 mg Intravenous Stopped 09/01/18 0905)  0.9 %   sodium chloride infusion (Manually program via Guardrails IV Fluids) ( Intravenous New Bag/Given 09/01/18 1028)    Mobility walks

## 2018-09-01 NOTE — Progress Notes (Signed)
PROGRESS NOTE    Tommy Peterson  ZOX:[id number] DOB: 1941/01/09 DOA: 09/01/2018 PCP: [facility]    Brief Narrative:  78 year old with past medical history relevant for on insulin, large granular lymphoma on methotrexate weekly followed at [facility], history of collagenous colitis not on any treatment, BPH complicated by recurrent pneumonias, paroxysmal atrial fibrillation not on anticoagulation who presented to the emergency department with lower urinary tract symptoms and found to have sepsis secondary to a urinary source.   Assessment & Plan:   Principal Problem:   UTI (urinary tract infection) Active Problems:   Colitis, collagenous   Large granular lymphocytic leukemia (HCC)   Diabetes mellitus with complication (HCC)   Macrocytic anemia   Sepsis ([place])   #) Sepsis due to urinary source/BPH: Patient has had now 4 episodes of sepsis from a urinary source.  He is immunocompromise due to his methotrexate uses for his large granular lymphoma.  It is unclear why he is not had a urology evaluation or any treatment for his lower urinary tract symptoms. -Continue IV ceftriaxone started 09/01/2018 -Continue IV vancomycin started 09/01/2010 -Follow-up blood cultures ordered 09/01/2018 -Follow-up urine cultures ordered 09/01/2018  #) Collagenous colitis: Patient apparently had been on budesonide for years but stopped the medication approximately 3 months ago due to constipation. - Patient started on stress dose steroids, wean  #) Type 2 diabetes: -Continue glargine 14 units nightly -Hold home pioglitazone -Hold nightly lixisenatide -Sliding scale insulin, AC at bedtime  #) Large granular lymphoma: -Hold methotrexate weekly while patient is septic  #) Paroxysmal atrial fibrillation: Patient has a history of this. -Monitor on telemetry -Currently not on anticoagulation will discuss with family  Fluids: Tolerating p.o. Electrolytes: Monitor and supplement Nutrition:  Heart healthy diet/carb restricted  Prophylaxis: Enoxaparin  Disposition: Pending resolution of sepsis and oral antibiotics  Full code    Consultants:   None  Procedures:   None  Antimicrobials:   IV ceftriaxone and vancomycin started on 26 2020   Subjective: This morning patient reports feeling better.  He denies any nausea, vomiting, abdominal pain, cough, congestion, back pain.  He reports subjectively feeling feverish and for which is a sign of his repeat UTI.  Objective: Vitals:   09/01/18 0830 09/01/18 0900 09/01/18 0930 09/01/18 1000  BP: (!) 109/41 (!) 112/46 (!) 117/59 (!) 114/54  Pulse: 82 85 80 76  Resp: 15 12 20 13   Temp:      TempSrc:      SpO2: 94% 100% 97% 96%  Weight:      Height:        Intake/Output Summary (Last 24 hours) at 09/01/2018 1108 Last data filed at 09/01/2018 1026 Gross per 24 hour  Intake 4617.5 ml  Output -  Net 4617.5 ml   Filed Weights   09/01/18 0211  Weight: 73.5 kg    Examination:  General exam: Appears calm and comfortable  Respiratory system: Clear to auscultation. Respiratory effort normal. Cardiovascular system: Regular rate and rhythm, no murmurs Gastrointestinal system: Soft, nondistended, no rebound or guarding, plus bowel sounds. Central nervous system: Alert and oriented.  Grossly intact, moving all extremities Extremities: No lower extremity edema Skin: No rashes over visible skin Psychiatry: Judgement and insight appear normal. Mood & affect appropriate.     Data Reviewed: I have personally reviewed following labs and imaging studies  CBC: Recent Labs  Lab 09/01/18 0227 09/01/18 0615  WBC 15.0* 12.9*  NEUTROABS 10.5*  --   HGB 8.1* 6.7*  HCT 24.6*  21.2*  MCV 128.1* 126.9*  PLT 301 545   Basic Metabolic Panel: Recent Labs  Lab 09/01/18 0227 09/01/18 0615  NA 138 140  K 4.0 3.7  CL 108 114*  CO2 24 23  GLUCOSE 156* 131*  BUN 19 16  CREATININE 0.58* 0.48*  CALCIUM 8.6* 8.1*    GFR: Estimated Creatinine Clearance: 79.1 mL/min (A) (by C-G formula based on SCr of 0.48 mg/dL (L)). Liver Function Tests: Recent Labs  Lab 09/01/18 0227  AST 21  ALT 25  ALKPHOS 68  BILITOT 0.9  PROT 6.5  ALBUMIN 3.8   No results for input(s): LIPASE, AMYLASE in the last 168 hours. No results for input(s): AMMONIA in the last 168 hours. Coagulation Profile: Recent Labs  Lab 09/01/18 0227  INR 1.21   Cardiac Enzymes: No results for input(s): CKTOTAL, CKMB, CKMBINDEX, TROPONINI in the last 168 hours. BNP (last 3 results) No results for input(s): PROBNP in the last 8760 hours. HbA1C: No results for input(s): HGBA1C in the last 72 hours. CBG: Recent Labs  Lab 09/01/18 0931  GLUCAP 137*   Lipid Profile: No results for input(s): CHOL, HDL, LDLCALC, TRIG, CHOLHDL, LDLDIRECT in the last 72 hours. Thyroid Function Tests: No results for input(s): TSH, T4TOTAL, FREET4, T3FREE, THYROIDAB in the last 72 hours. Anemia Panel: No results for input(s): VITAMINB12, FOLATE, FERRITIN, TIBC, IRON, RETICCTPCT in the last 72 hours. Sepsis Labs: Recent Labs  Lab 09/01/18 0227 09/01/18 0501 09/01/18 0615  PROCALCITON  --   --  <0.10  LATICACIDVEN 2.1* 1.4 1.1    No results found for this or any previous visit (from the past 240 hour(s)).       Radiology Studies: Dg Chest 2 View  Result Date: 09/01/2018 CLINICAL DATA:  Fever EXAM: CHEST - 2 VIEW COMPARISON:  None. FINDINGS: Loop recording device projects over the normal sized cardiac silhouette. There is aortic atherosclerosis at the arch without aneurysmal dilatation. Mild infrahilar atelectasis is noted at the right lung base. No overt pulmonary edema, effusion or pneumothorax. Tubing projects over the right upper thorax. No acute osseous abnormality. IMPRESSION: No active cardiopulmonary disease. Aortic atherosclerosis. Electronically Signed   By: Ashley Royalty M.D.   On: 09/01/2018 01:53        Scheduled Meds: .  docusate sodium  100 mg Oral Daily  . hydrocortisone sod succinate (SOLU-CORTEF) inj  25 mg Intravenous Q8H  . insulin aspart  0-9 Units Subcutaneous TID WC  . insulin glargine  14 Units Subcutaneous QHS  . [START ON 09/03/2018] methotrexate  30 mg Oral Q Tue   Continuous Infusions: . cefTRIAXone (ROCEPHIN)  IV Stopped (09/01/18 0419)  . vancomycin       LOS: 0 days    Time spent: [place], MD [facility]  If 7PM-7AM, please contact night-coverage [email or web address] Password [email or web address] 09/01/2018, 11:08 AM

## 2018-09-01 NOTE — ED Triage Notes (Addendum)
Patient is coming in for fever 102.3 by ear per EMS and hr 100. Patient has been having several uti's recently. Patient is getting chemo for his leukemia. Patient comes from home. o2 sats on room air is 97%.

## 2018-09-01 NOTE — ED Provider Notes (Signed)
[facility] DEPT Provider Note   CSN: [id number] Arrival date & time: 09/01/18  0134     History   Chief Complaint Chief Complaint  Patient presents with  . Fever  . Recurrent UTI    HPI Tommy Peterson is a 78 y.o. male.  HPI  78 year old male comes in with chief complaint of fever. He has history of recurrent UTI, diabetes, methotrexate use.  He reports that around 10:00 he started noticing that he was getting weak, followed by having frequent urination and burning with urination.  Patient denies any abdominal pain, nausea, vomiting.  He did start having some fevers and chills.  He has had history of septic shock because of UTI, therefore he decided to come to the ER right away.  He denies any flulike illness, chest pain, shortness of breath, abdominal pain, flank pain.  Past Medical History:  Diagnosis Date  . Acute respiratory failure ([place])   . BPH (benign prostatic hyperplasia)   . Carpal tunnel syndrome on both sides   . Chronic diarrhea    intermittant due to chronic collagenous colitis  . 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 Collagenous colitis    chronic--- intermittant between diarrahea and constipation  . ED (erectile dysfunction)   . HCAP (healthcare-associated pneumonia)   . History of adenomatous polyp of colon   . History of Clostridium difficile colitis 05/2016  . History of pneumonia 11/08/2017   CAP -- LLL---  12-19-2017  per pt no residual symptoms  . History of SCC (squamous cell carcinoma) of skin   . History of sepsis    11-20-2017 severe sepsis due to UTI;  10/ 2017sepsis due to c-diff colitis  . Leucocytosis     chronic  . Lower urinary tract symptoms (LUTS)   . Macrocytic anemia    since 02/ 2015  . MGUS (monoclonal gammopathy of unknown significance)    hemotologist-  [name] @ [facility]  . Monoclonal paraproteinemia   . Neuropathy, peripheral   . OA (osteoarthritis)   . Prostatic stone   . Rash of face    RIGHT SIDE  . Raynaud's syndrome   . Thin skin    fragile  . Transfusion-dependent anemia since 10/ 2017   last transfusion PRBCs 07-19-2017  per hemologist note dated 10-25-2017  . Type 2 diabetes mellitus treated with insulin ([place])    followed by [name] ([facility])  . Urinary hesitancy   . Wears contact lenses    LEFT EYE ONLY    Patient Active Problem List   Diagnosis Date Noted  . UTI (urinary tract infection) 09/01/2018  . Sepsis ([place]) 07/28/2018  . CAP (community acquired pneumonia) 07/28/2018  . Paroxysmal atrial fibrillation ([place]) 04/10/2018  . Aortic atherosclerosis ([place]) 04/08/2018  . DM type 2 (diabetes mellitus, type 2) ([place]) 02/19/2018  . Acute respiratory failure with hypoxia ([place]) 02/18/2018  . MGUS (monoclonal gammopathy of unknown significance) 11/23/2017  . BPH (benign prostatic hyperplasia) 11/23/2017  . Left lower lobe pneumonia ([place]) 11/20/2017  . Nausea vomiting and diarrhea 11/20/2017  . Leukemia not having achieved remission ([place]) 12/26/2016  . Anemia 12/26/2016  . Large granular lymphocytic leukemia ([place]) 12/26/2016  . Diabetes mellitus with complication ([place])   .  Macrocytic anemia   . DOE (dyspnea on exertion)   . Anxiety   . History of GI bleed   . C. difficile colitis 05/31/2016  . Lactic acidosis   . History of bone marrow biopsy 05/27/2016  . Fever 05/27/2016  . Encephalopathy in sepsis 05/27/2016  . Chest pain 05/04/2016  . Benign fibroma of prostate 09/20/2015  . HLD (hyperlipidemia) 09/20/2015  . BP (high blood pressure) 09/20/2015  . Arthritis, degenerative 09/20/2015  . Colitis, collagenous 09/09/2015  . Absolute anemia 09/08/2014  .  Adenoma of large intestine 07/13/2014  . Disturbance of skin sensation 01/09/2014  . Recurrent major depressive disorder, in full remission ([place]) 07/18/2012  . Allergic rhinitis 08/29/2011    Past Surgical History:  Procedure Laterality Date  . BONE MARROW BIOPSY Right 05-24-2016;  08-20-2014  . CARDIOVASCULAR STRESS TEST  11-26-2012   [name]  @ [facility] ([place]   normal nuclear study w/ no ischemia/  normal LV function and wall motion , ef 60% (find in care everywhere,epic)  . CATARACT EXTRACTION W/ INTRAOCULAR LENS  IMPLANT, BILATERAL  2015  . CYSTOSCOPY WITH LITHOLAPAXY N/A 12/25/2017   Procedure: CYSTOSCOPY WITH LITHOLAPAXY AND FULGERATION;  Surgeon: [name], MD;  Location: [facility];  Service: Urology;  Laterality: N/A;  . INGUINAL HERNIA REPAIR Right 1980s  . LOOP RECORDER INSERTION N/A 04/10/2018   Procedure: LOOP RECORDER INSERTION;  Surgeon: [name], MD;  Location: [facility];  Service: Cardiovascular;  Laterality: N/A;  . PENILE PROSTHESIS IMPLANT  12-02-2015   dr [name] @ [facility] in [place]  . SHOULDER SURGERY Left 08/2016  . TONSILLECTOMY  child  . TRANSURETHRAL RESECTION OF PROSTATE  2009   AND REPAIR RECURRENT RIGHT INGUINAL HERNIA        Home Medications    Prior to Admission medications   Medication Sig Start Date End Date Taking? Authorizing Provider  acetaminophen (TYLENOL) 325 MG tablet Take 2 tablets (650 mg total) by mouth every 6 (six) hours as needed for mild pain (or Fever >/= 101). 11/24/17   Elgergawy, Silver Huguenin, MD  Ascorbic Acid (VITAMIN C) 1000 MG tablet Take 1,000 mg by mouth daily.    [provider]  aspirin 325 MG EC tablet Take 650 mg by mouth every 6 (six) hours as needed for pain.    [provider]  budesonide (ENTOCORT EC) 3 MG 24 hr capsule Take 3 mg by mouth daily after breakfast.     [provider]  Insulin Glargine-Lixisenatide (SOLIQUA) 100-33  UNT-MCG/ML SOPN Inject 28 Units into the skin at bedtime.     [provider]  methotrexate (RHEUMATREX) 2.5 MG tablet Take 30 mg by mouth every Tuesday.  07/19/17   [provider]  pioglitazone (ACTOS) 15 MG tablet Take 15 mg by mouth every morning.  10/18/17   [provider]  vitamin B-12 (CYANOCOBALAMIN) 500 MCG tablet Take 1 tablet (500 mcg total) by mouth daily. 11/24/17   Elgergawy, Silver Huguenin, MD    Family History Family History  Problem Relation Age of Onset  . Diabetes Mother   . Heart failure Mother     Social History Social History   Tobacco Use  . Smoking status: Former Smoker    Years: 20.00    Types: Cigarettes    Last attempt to quit: 05/04/1996    Years since quitting: 22.3  . Smokeless tobacco: Never Used  Substance Use Topics  .  Alcohol use: Yes    Comment: 1-2 glasses of wine every other day  . Drug use: No     Allergies   Bee venom; Metformin and related; and Sulfamethoxazole-trimethoprim   Review of Systems Review of Systems  Constitutional: Positive for activity change.  Respiratory: Negative for cough and shortness of breath.   Cardiovascular: Negative for chest pain.  Gastrointestinal: Positive for abdominal pain.  Genitourinary: Positive for dysuria.  Allergic/Immunologic: Positive for immunocompromised state.  All other systems reviewed and are negative.    Physical Exam Updated Vital Signs BP (!) 139/54   Pulse 96   Temp (!) 101.3 F (38.5 C) (Rectal)   Resp 16   Ht '6\' 2"'$  (1.88 m)   Wt 73.5 kg   SpO2 95%   BMI 20.80 kg/m   Physical Exam Vitals signs and nursing note reviewed.  Constitutional:      Appearance: He is well-developed.  HENT:     Head: Atraumatic.  Neck:     Musculoskeletal: Neck supple.  Cardiovascular:     Rate and Rhythm: Tachycardia present.  Pulmonary:     Effort: Pulmonary effort is normal.  Skin:    General: Skin is warm.  Neurological:     Mental Status: He is alert and  oriented to person, place, and time.      ED Treatments / Results  Labs (all labs ordered are listed, but only abnormal results are displayed) Labs Reviewed  COMPREHENSIVE METABOLIC PANEL - Abnormal; Notable for the following components:      Result Value   Glucose, Bld 156 (*)    Creatinine, Ser 0.58 (*)    Calcium 8.6 (*)    All other components within normal limits  LACTIC ACID, PLASMA - Abnormal; Notable for the following components:   Lactic Acid, Venous 2.1 (*)    All other components within normal limits  CBC WITH DIFFERENTIAL/PLATELET - Abnormal; Notable for the following components:   WBC 15.0 (*)    RBC 1.92 (*)    Hemoglobin 8.1 (*)    HCT 24.6 (*)    MCV 128.1 (*)    MCH 42.2 (*)    RDW 16.4 (*)    Neutro Abs 10.5 (*)    Monocytes Absolute 1.3 (*)    Abs Immature Granulocytes 0.10 (*)    All other components within normal limits  URINALYSIS, ROUTINE W REFLEX MICROSCOPIC - Abnormal; Notable for the following components:   APPearance TURBID (*)    Glucose, UA 50 (*)    Hgb urine dipstick LARGE (*)    Protein, ur 100 (*)    Nitrite POSITIVE (*)    Leukocytes, UA LARGE (*)    RBC / HPF >50 (*)    WBC, UA >50 (*)    Bacteria, UA RARE (*)    All other components within normal limits  CULTURE, BLOOD (ROUTINE X 2)  CULTURE, BLOOD (ROUTINE X 2)  URINE CULTURE  PROTIME-INR  LACTIC ACID, PLASMA    EKG None  Radiology Dg Chest 2 View  Result Date: 09/01/2018 CLINICAL DATA:  Fever EXAM: CHEST - 2 VIEW COMPARISON:  None. FINDINGS: Loop recording device projects over the normal sized cardiac silhouette. There is aortic atherosclerosis at the arch without aneurysmal dilatation. Mild infrahilar atelectasis is noted at the right lung base. No overt pulmonary edema, effusion or pneumothorax. Tubing projects over the right upper thorax. No acute osseous abnormality. IMPRESSION: No active cardiopulmonary disease. Aortic atherosclerosis. Electronically Signed   By: Shanon Brow  Randel Pigg M.D.   On: 09/01/2018 01:53    Procedures .Critical Care Performed by: Varney Biles, MD Authorized by: Varney Biles, MD   Critical care provider statement:    Critical care time (minutes):  31   Critical care time was exclusive of:  Separately billable procedures and treating other patients   Critical care was necessary to treat or prevent imminent or life-threatening deterioration of the following conditions:  Sepsis   Critical care was time spent personally by me on the following activities:  Discussions with consultants, evaluation of patient's response to treatment, examination of patient, ordering and performing treatments and interventions, ordering and review of laboratory studies, ordering and review of radiographic studies, pulse oximetry, re-evaluation of patient's condition, obtaining history from patient or surrogate and review of old charts   (including critical care time)  Medications Ordered in ED Medications  lactated ringers bolus 1,000 mL (0 mLs Intravenous Stopped 09/01/18 0419)    And  lactated ringers bolus 250 mL (has no administration in time range)  cefTRIAXone (ROCEPHIN) 1 g in sodium chloride 0.9 % 100 mL IVPB (0 g Intravenous Stopped 09/01/18 0419)  vancomycin (VANCOCIN) 1,500 mg in sodium chloride 0.9 % 500 mL IVPB (has no administration in time range)  sodium chloride flush (NS) 0.9 % injection 3 mL (3 mLs Intravenous Given 09/01/18 0232)  lactated ringers bolus 1,000 mL (1,000 mLs Intravenous New Bag/Given 09/01/18 0309)    And  lactated ringers bolus 1,000 mL (0 mLs Intravenous Stopped 09/01/18 0419)     Initial Impression / Assessment and Plan / ED Course  I have reviewed the triage vital signs and the nursing notes.  Pertinent labs & imaging results that were available during my care of the patient were reviewed by me and considered in my medical decision making (see chart for details).  Clinical Course as of Sep 02 435  Sun Sep 01, 2018    0437 CBC has a left shift. Lactic acid is 2.1  WBC Morphology: MILD LEFT SHIFT (1-5% METAS, OCC MYELO, OCC BANDS) [AN]  0437 UA is nitrite positive and has all other markers that are concerning for infection.  Urine culture pending.  Nitrite(!): POSITIVE [AN]    Clinical Course User Index [AN] Varney Biles, MD    78 year old male with history of recurrent UTI who is immunosuppressed with methotrexate and immunocompromise because of diabetes comes in with chief complaint of frequent urination, pain with urination, fevers and chills.  He has history of septic shock due to UTI.  Clinically patient is having UTI.  Clinical suspicion is low for any other intra-abdominal process.  We will start him on ceftriaxone.  Although he is received IV antibiotics within the last 90 days, he was pansensitive -and so I think at this time ceftriaxone probably better than cefepime.  Final Clinical Impressions(s) / ED Diagnoses   Final diagnoses:  Lower urinary tract infectious disease  Severe sepsis [facility])    ED Discharge Orders    None       Varney Biles, MD 09/01/18 [phone]

## 2018-09-01 NOTE — ED Notes (Signed)
Date and time results received: 09/01/18 0305   Test: Lactic Critical Value: 2.1  Name of Provider Notified: Dr. Kathrynn Humble  Orders Received? Or Actions Taken?:

## 2018-09-02 DIAGNOSIS — L8990 Pressure ulcer of unspecified site, unspecified stage: Secondary | ICD-10-CM

## 2018-09-02 LAB — CBC
HCT: 23.4 % — ABNORMAL LOW (ref 39.0–52.0)
Hemoglobin: 7.5 g/dL — ABNORMAL LOW (ref 13.0–17.0)
MCH: 38.5 pg — ABNORMAL HIGH (ref 26.0–34.0)
MCHC: 32.1 g/dL (ref 30.0–36.0)
MCV: 120 fL — ABNORMAL HIGH (ref 80.0–100.0)
Platelets: 223 K/uL (ref 150–400)
RBC: 1.95 MIL/uL — ABNORMAL LOW (ref 4.22–5.81)
RDW: 23.4 % — ABNORMAL HIGH (ref 11.5–15.5)
WBC: 14.2 K/uL — ABNORMAL HIGH (ref 4.0–10.5)
nRBC: 0 % (ref 0.0–0.2)

## 2018-09-02 LAB — HEMOGLOBIN AND HEMATOCRIT, BLOOD
HCT: 25.9 % — ABNORMAL LOW (ref 39.0–52.0)
Hemoglobin: 8.4 g/dL — ABNORMAL LOW (ref 13.0–17.0)

## 2018-09-02 LAB — BASIC METABOLIC PANEL
Anion gap: 6 (ref 5–15)
BUN: 9 mg/dL (ref 8–23)
CO2: 23 mmol/L (ref 22–32)
Calcium: 8.2 mg/dL — ABNORMAL LOW (ref 8.9–10.3)
Chloride: 109 mmol/L (ref 98–111)
Creatinine, Ser: 0.45 mg/dL — ABNORMAL LOW (ref 0.61–1.24)
GFR calc Af Amer: >60 mL/min (ref >60)
Glucose, Bld: 187 mg/dL — ABNORMAL HIGH (ref 70–99)
Potassium: 3.7 mmol/L (ref 3.5–5.1)
Sodium: 138 mmol/L (ref 135–145)

## 2018-09-02 LAB — GLUCOSE, CAPILLARY
Glucose-Capillary: 131 mg/dL — ABNORMAL HIGH (ref 70–99)
Glucose-Capillary: 248 mg/dL — ABNORMAL HIGH (ref 70–99)
Glucose-Capillary: 231 mg/dL — ABNORMAL HIGH (ref 70–99)
Glucose-Capillary: 141 mg/dL — ABNORMAL HIGH (ref 70–99)

## 2018-09-02 LAB — BASIC METABOLIC PANEL WITH GFR: GFR calc non Af Amer: >60 mL/min (ref >60)

## 2018-09-02 LAB — MAGNESIUM: Magnesium: 1.9 mg/dL (ref 1.7–2.4)

## 2018-09-02 LAB — PREPARE RBC (CROSSMATCH)

## 2018-09-02 MED ORDER — POLYETHYLENE GLYCOL 3350 17 G PO PACK
17.0000 g | PACK | Freq: Two times a day (BID) | ORAL | Status: DC | PRN
  Administered 2018-09-02: 17 g via ORAL
  Filled 2018-09-02: qty 1

## 2018-09-02 MED ORDER — SODIUM CHLORIDE 0.9% IV SOLUTION: Freq: Once | INTRAVENOUS | Status: DC

## 2018-09-02 NOTE — Progress Notes (Addendum)
PROGRESS NOTE    Tommy Peterson  IHK:[id number] DOB: [birth year] DOA: 09/01/2018 PCP: [facility]    Brief Narrative:  78 year old with past medical history relevant for on insulin, large granular lymphoma on methotrexate weekly followed at [facility], history of collagenous colitis not on any treatment, BPH complicated by recurrent pneumonias, paroxysmal atrial fibrillation not on anticoagulation who presented to the emergency department with lower urinary tract symptoms and found to have sepsis secondary to a urinary source.   Assessment & Plan:   Principal Problem:   UTI (urinary tract infection) Active Problems:   Colitis, collagenous   Large granular lymphocytic leukemia (HCC)   Diabetes mellitus with complication (HCC)   Macrocytic anemia   Sepsis ([place])   Pressure injury of skin   #) Sepsis due to urinary source/BPH: Patient has had now 4 episodes of sepsis from a urinary source.  He is immunocompromise due to his methotrexate uses for his large granular lymphoma.   -Continue IV ceftriaxone started 09/01/2018 -Discontinue IV vancomycin  - blood cultures ordered 09/01/2018 no growth - urine cultures ordered 09/01/2018 greater than 100,000 gram-negative rods  #) Acute on chronic anemia: EMEA with baseline hemoglobin of around 7.5-8.5.  This appears to be predominantly macrocytic.  Suspect most likely related to methotrexate usage though possibly autoimmune hemolytic anemia secondary to LGL is also possible. -1 unit packed red blood cells today -We will consider additional work-up or defer to outpatient  #) Collagenous colitis: Patient apparently had been on budesonide for years but stopped the medication approximately 3 months ago due to constipation. - Discontinue stress dose steroids  #) Type 2 diabetes: -Continue glargine 14 units nightly -Hold home pioglitazone -Hold nightly lixisenatide -Sliding scale insulin, AC at bedtime  #) Large granular  lymphoma: -Hold methotrexate weekly while patient is septic  #) Paroxysmal atrial fibrillation: Patient has a history of this. -Monitor on telemetry -Currently not on anticoagulation will discuss with family  Fluids: Tolerating p.o. Electrolytes: Monitor and supplement Nutrition: Heart healthy diet/carb restricted  Prophylaxis: Enoxaparin  Disposition: Pending susceptibilities and oral antibiotics  Full code    Consultants:   None  Procedures:   None  Antimicrobials:   IV ceftriaxone 09/01/2018  IV vancomycin 09/01/2018 to 09/02/2018   Subjective: This morning patient reports feeling better.  He denies any nausea, diarrhea, abdominal pain, cough, congestion, chest pain.  He does report a history of BPH that required resection.  He reports he recently saw his urologist and had bladder stones that were removed due to repeated UTIs.  Objective: Vitals:   09/01/18 1742 09/01/18 2114 09/01/18 2300 09/02/18 0454  BP: 134/62 119/64  (!) 93/45  Pulse: 83 67  (!) 58  Resp: 16 (!) 28 (!) 22 18  Temp: 97.8 F (36.6 C) 98.7 F (37.1 C)  98.1 F (36.7 C)  TempSrc: Oral Oral  Oral  SpO2: 100% 96%  100%  Weight:      Height:        Intake/Output Summary (Last 24 hours) at 09/02/2018 1005 Last data filed at 09/02/2018 0455 Gross per 24 hour  Intake 1657 ml  Output 1900 ml  Net -243 ml   Filed Weights   09/01/18 0211 09/01/18 1430  Weight: 73.5 kg 80.2 kg    Examination:  General exam: Appears calm and comfortable  Respiratory system: Clear to auscultation. Respiratory effort normal. Cardiovascular system: Regular rate and rhythm, no murmurs Gastrointestinal system: Soft, nondistended, no rebound or guarding, plus bowel sounds. Central nervous system:  Alert and oriented.  Grossly intact, moving all extremities Extremities: No lower extremity edema Skin: No rashes over visible skin Psychiatry: Judgement and insight appear normal. Mood & affect appropriate.      Data Reviewed: I have personally reviewed following labs and imaging studies  CBC: Recent Labs  Lab 09/01/18 0227 09/01/18 0615 09/01/18 1942 09/02/18 0530  WBC 15.0* 12.9*  --  14.2*  NEUTROABS 10.5*  --   --   --   HGB 8.1* 6.7* 8.2* 7.5*  HCT 24.6* 21.2* 25.5* 23.4*  MCV 128.1* 126.9*  --  120.0*  PLT 301 270  --  494   Basic Metabolic Panel: Recent Labs  Lab 09/01/18 0227 09/01/18 0615 09/02/18 0530  NA 138 140 138  K 4.0 3.7 3.7  CL 108 114* 109  CO2 24 23 23   GLUCOSE 156* 131* 187*  BUN 19 16 9   CREATININE 0.58* 0.48* 0.45*  CALCIUM 8.6* 8.1* 8.2*  MG  --   --  1.9   GFR: Estimated Creatinine Clearance: 86.3 mL/min (A) (by C-G formula based on SCr of 0.45 mg/dL (L)). Liver Function Tests: Recent Labs  Lab 09/01/18 0227  AST 21  ALT 25  ALKPHOS 68  BILITOT 0.9  PROT 6.5  ALBUMIN 3.8   No results for input(s): LIPASE, AMYLASE in the last 168 hours. No results for input(s): AMMONIA in the last 168 hours. Coagulation Profile: Recent Labs  Lab 09/01/18 0227  INR 1.21   Cardiac Enzymes: No results for input(s): CKTOTAL, CKMB, CKMBINDEX, TROPONINI in the last 168 hours. BNP (last 3 results) No results for input(s): PROBNP in the last 8760 hours. HbA1C: No results for input(s): HGBA1C in the last 72 hours. CBG: Recent Labs  Lab 09/01/18 0931 09/01/18 1205 09/01/18 1700 09/01/18 2115 09/02/18 0746  GLUCAP 137* 133* 181* 285* 131*   Lipid Profile: No results for input(s): CHOL, HDL, LDLCALC, TRIG, CHOLHDL, LDLDIRECT in the last 72 hours. Thyroid Function Tests: No results for input(s): TSH, T4TOTAL, FREET4, T3FREE, THYROIDAB in the last 72 hours. Anemia Panel: No results for input(s): VITAMINB12, FOLATE, FERRITIN, TIBC, IRON, RETICCTPCT in the last 72 hours. Sepsis Labs: Recent Labs  Lab 09/01/18 0227 09/01/18 0501 09/01/18 0615  PROCALCITON  --   --  <0.10  LATICACIDVEN 2.1* 1.4 1.1    Recent Results (from the past 240 hour(s))   Urine culture     Status: Abnormal (Preliminary result)   Collection Time: 09/01/18  3:25 AM  Result Value Ref Range Status   Specimen Description   Final    URINE, RANDOM Performed at [facility] [address], [place], [place] [address]    Special Requests   Final    NONE Performed at [facility], [place] [address] [place], [place] [address]    Culture >=100,000 COLONIES/mL GRAM NEGATIVE RODS (A)  Final   Report Status PENDING  Incomplete         Radiology Studies: Dg Chest 2 View  Result Date: 09/01/2018 CLINICAL DATA:  Fever EXAM: CHEST - 2 VIEW COMPARISON:  None. FINDINGS: Loop recording device projects over the normal sized cardiac silhouette. There is aortic atherosclerosis at the arch without aneurysmal dilatation. Mild infrahilar atelectasis is noted at the right lung base. No overt pulmonary edema, effusion or pneumothorax. Tubing projects over the right upper thorax. No acute osseous abnormality. IMPRESSION: No active cardiopulmonary disease. Aortic atherosclerosis. Electronically Signed   By: Ashley Royalty M.D.   On: 09/01/2018 01:53  Scheduled Meds: . sodium chloride   Intravenous Once  . docusate sodium  100 mg Oral Daily  . hydrocortisone sod succinate (SOLU-CORTEF) inj  25 mg Intravenous Q8H  . insulin aspart  0-9 Units Subcutaneous TID WC  . insulin glargine  14 Units Subcutaneous QHS   Continuous Infusions: . cefTRIAXone (ROCEPHIN)  IV 2 g (09/02/18 0420)  . vancomycin 1,000 mg (09/02/18 0523)     LOS: 1 day    Time spent: [place], MD [facility]  If 7PM-7AM, please contact night-coverage [email or web address] Password [email or web address] 09/02/2018, 10:05 AM

## 2018-09-03 LAB — CBC
HCT: 26.7 % — ABNORMAL LOW (ref 39.0–52.0)
Hemoglobin: 8.7 g/dL — ABNORMAL LOW (ref 13.0–17.0)
MCH: 36.7 pg — ABNORMAL HIGH (ref 26.0–34.0)
MCHC: 32.6 g/dL (ref 30.0–36.0)
MCV: 112.7 fL — ABNORMAL HIGH (ref 80.0–100.0)
Platelets: 223 10*3/uL (ref 150–400)
RBC: 2.37 MIL/uL — ABNORMAL LOW (ref 4.22–5.81)
WBC: 11.8 K/uL — ABNORMAL HIGH (ref 4.0–10.5)
nRBC: 0 % (ref 0.0–0.2)

## 2018-09-03 LAB — BPAM RBC
Blood Product Expiration Date: 202002252359
Blood Product Expiration Date: 202002292359
ISSUE DATE / TIME: 202001271053
ISSUE DATE / TIME: 202001261447
Unit Type and Rh: 5100
Unit Type and Rh: 5100

## 2018-09-03 LAB — URINE CULTURE: Culture: >=100000 — AB

## 2018-09-03 LAB — BASIC METABOLIC PANEL
Anion gap: 7 (ref 5–15)
BUN: 8 mg/dL (ref 8–23)
CO2: 23 mmol/L (ref 22–32)
Calcium: 8.1 mg/dL — ABNORMAL LOW (ref 8.9–10.3)
Chloride: 108 mmol/L (ref 98–111)
Creatinine, Ser: 0.39 mg/dL — ABNORMAL LOW (ref 0.61–1.24)
GFR calc Af Amer: >60 mL/min (ref >60)
GFR calc non Af Amer: >60 mL/min (ref >60)
Glucose, Bld: 163 mg/dL — ABNORMAL HIGH (ref 70–99)
Potassium: 3.3 mmol/L — ABNORMAL LOW (ref 3.5–5.1)

## 2018-09-03 LAB — TYPE AND SCREEN
ABO/RH(D): O POS
Antibody Screen: NEGATIVE
UNIT DIVISION: 0
UNIT DIVISION: 0

## 2018-09-03 LAB — BASIC METABOLIC PANEL WITH GFR: Sodium: 138 mmol/L (ref 135–145)

## 2018-09-03 LAB — GLUCOSE, CAPILLARY: Glucose-Capillary: 135 mg/dL — ABNORMAL HIGH (ref 70–99)

## 2018-09-03 MED ORDER — CEPHALEXIN 500 MG PO CAPS: 1000.0000 mg | ORAL_CAPSULE | Freq: Three times a day (TID) | ORAL | 0 refills | Status: DC

## 2018-09-03 MED ORDER — POTASSIUM CHLORIDE CRYS ER 20 MEQ PO TBCR
40.0000 meq | EXTENDED_RELEASE_TABLET | Freq: Once | ORAL | Status: AC
  Administered 2018-09-03: 40 meq via ORAL
  Filled 2018-09-03: qty 2

## 2018-09-03 MED ORDER — SENNOSIDES-DOCUSATE SODIUM 8.6-50 MG PO TABS: 1.0000 | ORAL_TABLET | Freq: Every evening | ORAL | Status: DC | PRN

## 2018-09-03 MED ORDER — CEPHALEXIN 500 MG PO CAPS: 1000.0000 mg | ORAL_CAPSULE | Freq: Three times a day (TID) | ORAL | 0 refills | Status: AC

## 2018-09-03 NOTE — Discharge Summary (Signed)
Physician Discharge Summary  Tommy Peterson XNA:[id number] DOB: 16-Apr-1941 DOA: 09/01/2018  PCP: [facility]  Admit date: 09/01/2018 Discharge date: 09/03/2018  Admitted From: Home Disposition:  Home  Recommendations for Outpatient Follow-up:  1. Follow up with PCP in 1-2 weeks 2. Please obtain BMP/CBC in one week 3. Please hold your methotrexate this week and you can start it next week if you are feeling well.  Home Health: No Equipment/Devices:none  Discharge Condition: stable CODE STATUS: FULL Diet recommendation: Heart Healthy / Carb Modified   Brief/Interim Summary:  #) Sepsis due to UTI/BPH: Patient has a history of BPH requiring resection.  He has had repeated UTIs presenting with sepsis.  Presented patient presented again with sepsis secondary to UTI.  Patient was continued on IV ceftriaxone.  Blood cultures were no growth to date.  Urine cultures grew out greater than 100,000 gram-negative rods of E. coli which was susceptible to cephalexin.  Patient was discharged on cephalexin as an outpatient.  #) Acute on chronic anemia: Patient was admitted with mild worsening of his baseline anemia of around 7.5-8.5.  Patient was given transfusion.  #) Collagenous colitis: Patient is a long history of collagenous colitis and was on enteric budesonide for years but stopped this medication approximately 3 months ago.  Due to concern for sepsis patient was initially on stress dose steroids however these were discontinued on discharge.  #) Type 2 diabetes: Maintain on home insulin.  His home glitazone and lixisenatide were held.  He was maintained on sliding scale insulin.  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  He may hold it for 1 week and then restarted if he is feeling well the next week.  #)  paroxysmal atrial fibrillation: Patient has a history of this but none currently.   Discharge Diagnoses:  Principal  Problem:   UTI (urinary tract infection) Active Problems:   Colitis, collagenous   Large granular lymphocytic leukemia (HCC)   Diabetes mellitus with complication (HCC)   Macrocytic anemia   Sepsis (HCC)   Pressure injury of skin    Discharge Instructions  Discharge Instructions    Call MD for:  difficulty breathing, headache or visual disturbances   Complete by:  As directed    Call MD for:  hives   Complete by:  As directed    Call MD for:  persistant dizziness or light-headedness   Complete by:  As directed    Call MD for:  persistant nausea and vomiting   Complete by:  As directed    Call MD for:  redness, tenderness, or signs of infection (pain, swelling, redness, odor or green/yellow discharge around incision site)   Complete by:  As directed    Call MD for:  severe uncontrolled pain   Complete by:  As directed    Call MD for:  temperature >100.4   Complete by:  As directed    Diet - low sodium heart healthy   Complete by:  As directed    Discharge instructions   Complete by:  As directed    Please take your antibiotics as prescribed.  Please follow-up with your urologist as an outpatient.  Please hold your methotrexate for 1 week.   Increase activity slowly   Complete by:  As directed      Allergies as of 09/03/2018      Reactions   Bee Venom Swelling   Metformin And Related Nausea And Vomiting   Sulfamethoxazole-trimethoprim  Nausea And Vomiting      Medication List    TAKE these medications   acetaminophen 325 MG tablet Commonly known as:  TYLENOL Take 2 tablets (650 mg total) by mouth every 6 (six) hours as needed for mild pain (or Fever >/= 101).   cephALEXin 500 MG capsule Commonly known as:  KEFLEX Take 2 capsules (1,000 mg total) by mouth 3 (three) times daily for 5 days.   docusate sodium 100 MG capsule Commonly known as:  COLACE Take 100 mg by mouth daily.   methotrexate 2.5 MG tablet Commonly known as:  RHEUMATREX Take 30 mg by mouth every  Tuesday.   pioglitazone 15 MG tablet Commonly known as:  ACTOS Take 15 mg by mouth every morning.   SOLIQUA 100-33 UNT-MCG/ML Sopn Generic drug:  Insulin Glargine-Lixisenatide Inject 28 Units into the skin at bedtime.   vitamin B-12 500 MCG tablet Commonly known as:  CYANOCOBALAMIN Take 1 tablet (500 mcg total) by mouth daily.       Allergies  Allergen Reactions  . Bee Venom Swelling  . Metformin And Related Nausea And Vomiting  . Sulfamethoxazole-Trimethoprim Nausea And Vomiting    Consultations:  None   Procedures/Studies: Dg Chest 2 View  Result Date: 09/01/2018 CLINICAL DATA:  Fever EXAM: CHEST - 2 VIEW COMPARISON:  None. FINDINGS: Loop recording device projects over the normal sized cardiac silhouette. There is aortic atherosclerosis at the arch without aneurysmal dilatation. Mild infrahilar atelectasis is noted at the right lung base. No overt pulmonary edema, effusion or pneumothorax. Tubing projects over the right upper thorax. No acute osseous abnormality. IMPRESSION: No active cardiopulmonary disease. Aortic atherosclerosis. Electronically Signed   By: Ashley Royalty M.D.   On: 09/01/2018 01:53       Subjective:   Discharge Exam: Vitals:   09/02/18 2053 09/03/18 0508  BP: 137/67 (!) 107/54  Pulse: 76 (!) 57  Resp: 18 17  Temp: 98.6 F (37 C) 98.1 F (36.7 C)  SpO2: 93% 96%   Vitals:   09/02/18 1331 09/02/18 1402 09/02/18 2053 09/03/18 0508  BP: (!) 130/58 119/61 137/67 (!) 107/54  Pulse: 96 83 76 (!) 57  Resp: (!) 24 (!) 24 18 17   Temp: 99.4 F (37.4 C) 98.2 F (36.8 C) 98.6 F (37 C) 98.1 F (36.7 C)  TempSrc: Oral Oral Oral Oral  SpO2: 94% 94% 93% 96%  Weight:      Height:       General exam: Appears calm and comfortable  Respiratory system: Clear to auscultation. Respiratory effort normal. Cardiovascular system: Regular rate and rhythm, no murmurs Gastrointestinal system: Soft, nondistended, no rebound or guarding, plus bowel  sounds. Central nervous system: Alert and oriented.  Grossly intact, moving all extremities Extremities: No lower extremity edema Skin: No rashes over visible skin Psychiatry: Judgement and insight appear normal. Mood & affect appropriate.    The results of significant diagnostics from this hospitalization (including imaging, microbiology, ancillary and laboratory) are listed below for reference.     Microbiology: Recent Results (from the past 240 hour(s))  Culture, blood (Routine x 2)     Status: None (Preliminary result)   Collection Time: 09/01/18  2:27 AM  Result Value Ref Range Status   Specimen Description   Final    BLOOD RIGHT WRIST Performed at [facility] [address] [place], [place] [address]    Special Requests   Final    BOTTLES DRAWN AEROBIC AND ANAEROBIC Blood Culture adequate volume Performed  at [facility], [place] [address] [place], [place] [address]    Culture   Final    NO GROWTH 1 DAY Performed at [facility], [place] [address] [place], [place] [address]    Report Status PENDING  Incomplete  Culture, blood (Routine x 2)     Status: None (Preliminary result)   Collection Time: 09/01/18  2:27 AM  Result Value Ref Range Status   Specimen Description   Final    BLOOD RIGHT HAND Performed at [facility] [address], [place], [place] [address]    Special Requests   Final    BOTTLES DRAWN AEROBIC AND ANAEROBIC Blood Culture results may not be optimal due to an excessive volume of blood received in culture bottles Performed at [facility] [address] [place], [place] [address]    Culture   Final    NO GROWTH 1 DAY Performed at [facility], [place] [address], [place], [place] [address]    Report Status PENDING  Incomplete  Urine culture     Status: Abnormal   Collection Time: 09/01/18  3:25 AM  Result Value Ref Range Status   Specimen Description   Final     URINE, RANDOM Performed at [facility] [address], [place], [place] [address]    Special Requests   Final    NONE Performed at [facility], [place] [address], [place], [place] [address]    Culture >=100,000 COLONIES/mL ESCHERICHIA COLI (A)  Final   Report Status 09/03/2018 FINAL  Final   Organism ID, Bacteria ESCHERICHIA COLI (A)  Final      Susceptibility   Escherichia coli - MIC*    AMPICILLIN >=32 RESISTANT Resistant     CEFAZOLIN <=4 SENSITIVE Sensitive     CEFTRIAXONE <=1 SENSITIVE Sensitive     CIPROFLOXACIN <=0.25 SENSITIVE Sensitive     GENTAMICIN >=16 RESISTANT Resistant     IMIPENEM <=0.25 SENSITIVE Sensitive     NITROFURANTOIN <=16 SENSITIVE Sensitive     TRIMETH/SULFA <=20 SENSITIVE Sensitive     AMPICILLIN/SULBACTAM 16 INTERMEDIATE Intermediate     PIP/TAZO <=4 SENSITIVE Sensitive     Extended ESBL NEGATIVE Sensitive     * >=100,000 COLONIES/mL ESCHERICHIA COLI     Labs: BNP (last 3 results) Recent Labs    02/18/18 0951  BNP 71.0   Basic Metabolic Panel: Recent Labs  Lab 09/01/18 0227 09/01/18 0615 09/02/18 0530 09/03/18 0542  NA 138 140 138 138  K 4.0 3.7 3.7 3.3*  CL 108 114* 109 108  CO2 24 23 23 23   GLUCOSE 156* 131* 187* 163*  BUN 19 16 9 8   CREATININE 0.58* 0.48* 0.45* 0.39*  CALCIUM 8.6* 8.1* 8.2* 8.1*  MG  --   --  1.9  --    Liver Function Tests: Recent Labs  Lab 09/01/18 0227  AST 21  ALT 25  ALKPHOS 68  BILITOT 0.9  PROT 6.5  ALBUMIN 3.8   No results for input(s): LIPASE, AMYLASE in the last 168 hours. No results for input(s): AMMONIA in the last 168 hours. CBC: Recent Labs  Lab 09/01/18 0227 09/01/18 0615 09/01/18 1942 09/02/18 0530 09/02/18 1540 09/03/18 0542  WBC 15.0* 12.9*  --  14.2*  --  11.8*  NEUTROABS 10.5*  --   --   --   --   --   HGB 8.1* 6.7* 8.2* 7.5* 8.4* 8.7*  HCT 24.6*  21.2* 25.5* 23.4* 25.9* 26.7*  MCV 128.1* 126.9*  --  120.0*  --  112.7*  PLT 301 270  --   223  --  223   Cardiac Enzymes: No results for input(s): CKTOTAL, CKMB, CKMBINDEX, TROPONINI in the last 168 hours. BNP: Invalid input(s): POCBNP CBG: Recent Labs  Lab 09/02/18 0746 09/02/18 1116 09/02/18 1738 09/02/18 2122 09/03/18 0742  GLUCAP 131* 248* 231* 141* 135*   D-Dimer No results for input(s): DDIMER in the last 72 hours. Hgb A1c No results for input(s): HGBA1C in the last 72 hours. Lipid Profile No results for input(s): CHOL, HDL, LDLCALC, TRIG, CHOLHDL, LDLDIRECT in the last 72 hours. Thyroid function studies No results for input(s): TSH, T4TOTAL, T3FREE, THYROIDAB in the last 72 hours.  Invalid input(s): FREET3 Anemia work up No results for input(s): VITAMINB12, FOLATE, FERRITIN, TIBC, IRON, RETICCTPCT in the last 72 hours. Urinalysis    Component Value Date/Time   COLORURINE YELLOW 09/01/2018 0325   APPEARANCEUR TURBID (A) 09/01/2018 0325   LABSPEC 1.020 09/01/2018 0325   PHURINE 5.0 09/01/2018 0325   GLUCOSEU 50 (A) 09/01/2018 0325   HGBUR LARGE (A) 09/01/2018 0325   BILIRUBINUR NEGATIVE 09/01/2018 0325   BILIRUBINUR negative 10/29/2017 1428   KETONESUR NEGATIVE 09/01/2018 0325   PROTEINUR 100 (A) 09/01/2018 0325   UROBILINOGEN 0.2 10/29/2017 1428   NITRITE POSITIVE (A) 09/01/2018 0325   LEUKOCYTESUR LARGE (A) 09/01/2018 0325   Sepsis Labs Invalid input(s): PROCALCITONIN,  WBC,  LACTICIDVEN Microbiology Recent Results (from the past 240 hour(s))  Culture, blood (Routine x 2)     Status: None (Preliminary result)   Collection Time: 09/01/18  2:27 AM  Result Value Ref Range Status   Specimen Description   Final    BLOOD RIGHT WRIST Performed at [facility] [address] [place], [place] [address]    Special Requests   Final    BOTTLES DRAWN AEROBIC AND ANAEROBIC Blood Culture adequate volume Performed at [facility] [address] [place], [place] [address]    Culture   Final    NO GROWTH 1  DAY Performed at [facility], [place] [address], [place], [place] [address]    Report Status PENDING  Incomplete  Culture, blood (Routine x 2)     Status: None (Preliminary result)   Collection Time: 09/01/18  2:27 AM  Result Value Ref Range Status   Specimen Description   Final    BLOOD RIGHT HAND Performed at [facility] [address], [place], [place] [address]    Special Requests   Final    BOTTLES DRAWN AEROBIC AND ANAEROBIC Blood Culture results may not be optimal due to an excessive volume of blood received in culture bottles Performed at [facility] [address] [place], [place] [address]    Culture   Final    NO GROWTH 1 DAY Performed at [facility], [place] [address], [place], [place] [address]    Report Status PENDING  Incomplete  Urine culture     Status: Abnormal   Collection Time: 09/01/18  3:25 AM  Result Value Ref Range Status   Specimen Description   Final    URINE, RANDOM Performed at [facility] [address] [place], [place] [address]    Special Requests   Final    NONE Performed at [facility], [place] [address] [place], [place] [address]    Culture >=100,000  COLONIES/mL ESCHERICHIA COLI (A)  Final   Report Status 09/03/2018 FINAL  Final   Organism ID, Bacteria ESCHERICHIA COLI (A)  Final      Susceptibility   Escherichia coli - MIC*    AMPICILLIN >=32 RESISTANT Resistant     CEFAZOLIN <=4 SENSITIVE Sensitive     CEFTRIAXONE <=1 SENSITIVE Sensitive     CIPROFLOXACIN <=0.25 SENSITIVE Sensitive     GENTAMICIN >=16 RESISTANT Resistant     IMIPENEM <=0.25 SENSITIVE Sensitive     NITROFURANTOIN <=16 SENSITIVE Sensitive     TRIMETH/SULFA <=20 SENSITIVE Sensitive     AMPICILLIN/SULBACTAM 16 INTERMEDIATE Intermediate     PIP/TAZO <=4 SENSITIVE Sensitive     Extended ESBL NEGATIVE Sensitive     * >=100,000 COLONIES/mL ESCHERICHIA COLI     Time coordinating  discharge: 35  SIGNED:   Cristy Folks, MD  [facility] 09/03/2018, 9:11 AM  If 7PM-7AM, please contact night-coverage [email or web address] Password [email or web address]

## 2018-09-03 NOTE — Progress Notes (Signed)
Patient's IVs (2) removed.  Site WNL.  AVS reviewed with patient.  Verbalized understanding of discharge instructions, physician follow-up, medications.  Patient reports belongings intact and in possession.  Prescription sent to [facility] per patient request.  Patient states received call it was ready for pick up.  Patient transported by RN via w/c to main entrance at discharge.  Patient stable at time of discharge.

## 2018-09-03 NOTE — Discharge Instructions (Signed)
Urinary Tract Infection, Adult A urinary tract infection (UTI) is an infection of any part of the urinary tract. The urinary tract includes:  The kidneys.  The ureters.  The bladder.  The urethra. These organs make, store, and get rid of pee (urine) in the body. What are the causes? This is caused by germs (bacteria) in your genital area. These germs grow and cause swelling (inflammation) of your urinary tract. What increases the risk? You are more likely to develop this condition if:  You have a small, thin tube (catheter) to drain pee.  You cannot control when you pee or poop (incontinence).  You are male, and: ? You use these methods to prevent pregnancy: ? A medicine that kills sperm (spermicide). ? A device that blocks sperm (diaphragm). ? You have low levels of a male hormone (estrogen). ? You are pregnant.  You have genes that add to your risk.  You are sexually active.  You take antibiotic medicines.  You have trouble peeing because of: ? A prostate that is bigger than normal, if you are male. ? A blockage in the part of your body that drains pee from the bladder (urethra). ? A kidney stone. ? A nerve condition that affects your bladder (neurogenic bladder). ? Not getting enough to drink. ? Not peeing often enough.  You have other conditions, such as: ? Diabetes. ? A weak disease-fighting system (immune system). ? Sickle cell disease. ? Gout. ? Injury of the spine. What are the signs or symptoms? Symptoms of this condition include:  Needing to pee right away (urgently).  Peeing often.  Peeing small amounts often.  Pain or burning when peeing.  Blood in the pee.  Pee that smells bad or not like normal.  Trouble peeing.  Pee that is cloudy.  Fluid coming from the vagina, if you are male.  Pain in the belly or lower back. Other symptoms include:  Throwing up (vomiting).  No urge to eat.  Feeling mixed up (confused).  Being tired  and grouchy (irritable).  A fever.  Watery poop (diarrhea). How is this treated? This condition may be treated with:  Antibiotic medicine.  Other medicines.  Drinking enough water. Follow these instructions at home:  Medicines  Take over-the-counter and prescription medicines only as told by your doctor.  If you were prescribed an antibiotic medicine, take it as told by your doctor. Do not stop taking it even if you start to feel better. General instructions  Make sure you: ? Pee until your bladder is empty. ? Do not hold pee for a long time. ? Empty your bladder after sex. ? Wipe from front to back after pooping if you are a male. Use each tissue one time when you wipe.  Drink enough fluid to keep your pee pale yellow.  Keep all follow-up visits as told by your doctor. This is important. Contact a doctor if:  You do not get better after 1-2 days.  Your symptoms go away and then come back. Get help right away if:  You have very bad back pain.  You have very bad pain in your lower belly.  You have a fever.  You are sick to your stomach (nauseous).  You are throwing up. Summary  A urinary tract infection (UTI) is an infection of any part of the urinary tract.  This condition is caused by germs in your genital area.  There are many risk factors for a UTI. These include having a small, thin   tube to drain pee and not being able to control when you pee or poop.  Treatment includes antibiotic medicines for germs.  Drink enough fluid to keep your pee pale yellow. This information is not intended to replace advice given to you by your health care provider. Make sure you discuss any questions you have with your health care provider. Document Released: 01/10/2008 Document Revised: 01/31/2018 Document Reviewed: 01/31/2018 Elsevier Interactive Patient Education  2019 Elsevier Inc.  

## 2018-09-06 LAB — CULTURE, BLOOD (ROUTINE X 2)
Culture: NO GROWTH
Culture: NO GROWTH
Special Requests: ADEQUATE

## 2018-09-11 DIAGNOSIS — N3941 Urge incontinence: Secondary | ICD-10-CM | POA: Diagnosis not present

## 2018-09-11 DIAGNOSIS — Z87440 Personal history of urinary (tract) infections: Secondary | ICD-10-CM | POA: Diagnosis not present

## 2018-09-11 DIAGNOSIS — N210 Calculus in bladder: Secondary | ICD-10-CM | POA: Diagnosis not present

## 2018-09-11 DIAGNOSIS — R351 Nocturia: Secondary | ICD-10-CM | POA: Diagnosis not present

## 2018-09-18 DIAGNOSIS — M25521 Pain in right elbow: Secondary | ICD-10-CM | POA: Diagnosis not present

## 2018-09-19 DIAGNOSIS — M25521 Pain in right elbow: Secondary | ICD-10-CM | POA: Diagnosis not present

## 2018-09-23 ENCOUNTER — Ambulatory Visit (INDEPENDENT_AMBULATORY_CARE_PROVIDER_SITE_OTHER): Payer: Medicare HMO

## 2018-09-23 DIAGNOSIS — I480 Paroxysmal atrial fibrillation: Secondary | ICD-10-CM | POA: Diagnosis not present

## 2018-09-23 LAB — CUP PACEART REMOTE DEVICE CHECK
Date Time Interrogation Session: 20200217004030
Implantable Pulse Generator Implant Date: 20190904

## 2018-09-26 DIAGNOSIS — D649 Anemia, unspecified: Secondary | ICD-10-CM | POA: Diagnosis not present

## 2018-09-26 DIAGNOSIS — C9110 Chronic lymphocytic leukemia of B-cell type not having achieved remission: Secondary | ICD-10-CM | POA: Diagnosis not present

## 2018-09-26 DIAGNOSIS — C91Z0 Other lymphoid leukemia not having achieved remission: Secondary | ICD-10-CM | POA: Diagnosis not present

## 2018-09-26 DIAGNOSIS — D472 Monoclonal gammopathy: Secondary | ICD-10-CM | POA: Diagnosis not present

## 2018-09-27 ENCOUNTER — Other Ambulatory Visit: Payer: Self-pay

## 2018-09-27 NOTE — Patient Outreach (Signed)
[facility]) Care Management  09/27/2018  Tommy Peterson 01-May-1941 [id number]     Transition of Care Referral  Referral Date: 09/27/2018 Referral Source: [facility] Discharge Report Date of Admission: unknown Diagnosis: "s/p urosepsis" Date of Discharge: 09/03/2018 Facility: [facility]: [facility]   Referral received. No outreach warranted at this time. TOC will be completed by primary care provider office who will refer to [facility] care mgmt if needed.    Plan: RN CM will close case at this time.   Enzo Montgomery, RN,BSN,CCM [facility] Management Telephonic Care Management Coordinator Direct Phone: [phone] Toll Free: [phone] Fax: [phone]

## 2018-10-01 DIAGNOSIS — C4441 Basal cell carcinoma of skin of scalp and neck: Secondary | ICD-10-CM | POA: Diagnosis not present

## 2018-10-01 DIAGNOSIS — L821 Other seborrheic keratosis: Secondary | ICD-10-CM | POA: Diagnosis not present

## 2018-10-01 DIAGNOSIS — C44319 Basal cell carcinoma of skin of other parts of face: Secondary | ICD-10-CM | POA: Diagnosis not present

## 2018-10-01 DIAGNOSIS — C4491 Basal cell carcinoma of skin, unspecified: Secondary | ICD-10-CM

## 2018-10-01 DIAGNOSIS — D229 Melanocytic nevi, unspecified: Secondary | ICD-10-CM | POA: Diagnosis not present

## 2018-10-01 DIAGNOSIS — C44719 Basal cell carcinoma of skin of left lower limb, including hip: Secondary | ICD-10-CM | POA: Diagnosis not present

## 2018-10-01 HISTORY — DX: Basal cell carcinoma of skin, unspecified: C44.91

## 2018-10-02 NOTE — Progress Notes (Signed)
Carelink Summary Report / Loop Recorder 

## 2018-10-03 DIAGNOSIS — Z6825 Body mass index (BMI) 25.0-25.9, adult: Secondary | ICD-10-CM | POA: Diagnosis not present

## 2018-10-03 DIAGNOSIS — K529 Noninfective gastroenteritis and colitis, unspecified: Secondary | ICD-10-CM | POA: Diagnosis not present

## 2018-10-03 DIAGNOSIS — Z794 Long term (current) use of insulin: Secondary | ICD-10-CM | POA: Diagnosis not present

## 2018-10-03 DIAGNOSIS — E119 Type 2 diabetes mellitus without complications: Secondary | ICD-10-CM | POA: Diagnosis not present

## 2018-10-04 ENCOUNTER — Telehealth: Payer: Self-pay

## 2018-10-04 NOTE — Telephone Encounter (Signed)
Transmission received false Tachy episodes

## 2018-10-04 NOTE — Telephone Encounter (Signed)
I spoke with the patient about this. I help the pt send a manual transmission.

## 2018-10-10 DIAGNOSIS — L089 Local infection of the skin and subcutaneous tissue, unspecified: Secondary | ICD-10-CM | POA: Diagnosis not present

## 2018-10-18 DIAGNOSIS — N210 Calculus in bladder: Secondary | ICD-10-CM | POA: Diagnosis not present

## 2018-10-18 DIAGNOSIS — R350 Frequency of micturition: Secondary | ICD-10-CM | POA: Diagnosis not present

## 2018-10-18 DIAGNOSIS — N5201 Erectile dysfunction due to arterial insufficiency: Secondary | ICD-10-CM | POA: Diagnosis not present

## 2018-10-18 DIAGNOSIS — N3020 Other chronic cystitis without hematuria: Secondary | ICD-10-CM | POA: Diagnosis not present

## 2018-10-21 DIAGNOSIS — N3020 Other chronic cystitis without hematuria: Secondary | ICD-10-CM | POA: Diagnosis not present

## 2018-10-25 ENCOUNTER — Ambulatory Visit (INDEPENDENT_AMBULATORY_CARE_PROVIDER_SITE_OTHER): Payer: Medicare HMO | Admitting: *Deleted

## 2018-10-25 ENCOUNTER — Other Ambulatory Visit: Payer: Self-pay

## 2018-10-25 DIAGNOSIS — I480 Paroxysmal atrial fibrillation: Secondary | ICD-10-CM | POA: Diagnosis not present

## 2018-10-26 LAB — CUP PACEART REMOTE DEVICE CHECK
Date Time Interrogation Session: 20200321020751
Implantable Pulse Generator Implant Date: 20190904

## 2018-10-28 DIAGNOSIS — R350 Frequency of micturition: Secondary | ICD-10-CM | POA: Diagnosis not present

## 2018-10-28 DIAGNOSIS — R3915 Urgency of urination: Secondary | ICD-10-CM | POA: Diagnosis not present

## 2018-10-28 DIAGNOSIS — R351 Nocturia: Secondary | ICD-10-CM | POA: Diagnosis not present

## 2018-11-01 NOTE — Progress Notes (Signed)
Carelink Summary Report / Loop Recorder 

## 2018-11-04 DIAGNOSIS — H6991 Unspecified Eustachian tube disorder, right ear: Secondary | ICD-10-CM | POA: Diagnosis not present

## 2018-11-05 DIAGNOSIS — D649 Anemia, unspecified: Secondary | ICD-10-CM | POA: Diagnosis not present

## 2018-11-05 DIAGNOSIS — C91Z0 Other lymphoid leukemia not having achieved remission: Secondary | ICD-10-CM | POA: Diagnosis not present

## 2018-11-06 DIAGNOSIS — C91Z0 Other lymphoid leukemia not having achieved remission: Secondary | ICD-10-CM | POA: Diagnosis not present

## 2018-11-06 DIAGNOSIS — D630 Anemia in neoplastic disease: Secondary | ICD-10-CM | POA: Diagnosis not present

## 2018-11-06 DIAGNOSIS — D649 Anemia, unspecified: Secondary | ICD-10-CM | POA: Diagnosis not present

## 2018-11-07 DIAGNOSIS — D630 Anemia in neoplastic disease: Secondary | ICD-10-CM | POA: Diagnosis not present

## 2018-11-07 DIAGNOSIS — J988 Other specified respiratory disorders: Secondary | ICD-10-CM | POA: Diagnosis not present

## 2018-11-07 DIAGNOSIS — C91Z0 Other lymphoid leukemia not having achieved remission: Secondary | ICD-10-CM | POA: Diagnosis not present

## 2018-11-07 DIAGNOSIS — C9110 Chronic lymphocytic leukemia of B-cell type not having achieved remission: Secondary | ICD-10-CM | POA: Diagnosis not present

## 2018-11-07 DIAGNOSIS — D649 Anemia, unspecified: Secondary | ICD-10-CM | POA: Diagnosis not present

## 2018-11-12 DIAGNOSIS — N5201 Erectile dysfunction due to arterial insufficiency: Secondary | ICD-10-CM | POA: Diagnosis not present

## 2018-11-12 DIAGNOSIS — N210 Calculus in bladder: Secondary | ICD-10-CM | POA: Diagnosis not present

## 2018-11-12 DIAGNOSIS — R350 Frequency of micturition: Secondary | ICD-10-CM | POA: Diagnosis not present

## 2018-11-12 DIAGNOSIS — N312 Flaccid neuropathic bladder, not elsewhere classified: Secondary | ICD-10-CM | POA: Diagnosis not present

## 2018-11-27 ENCOUNTER — Other Ambulatory Visit: Payer: Self-pay

## 2018-11-27 ENCOUNTER — Ambulatory Visit (INDEPENDENT_AMBULATORY_CARE_PROVIDER_SITE_OTHER): Payer: Medicare HMO | Admitting: *Deleted

## 2018-11-27 DIAGNOSIS — I480 Paroxysmal atrial fibrillation: Secondary | ICD-10-CM | POA: Diagnosis not present

## 2018-11-28 LAB — CUP PACEART REMOTE DEVICE CHECK
Date Time Interrogation Session: 20200423023716
Implantable Pulse Generator Implant Date: 20190904

## 2018-12-02 DIAGNOSIS — K529 Noninfective gastroenteritis and colitis, unspecified: Secondary | ICD-10-CM | POA: Diagnosis not present

## 2018-12-02 DIAGNOSIS — E119 Type 2 diabetes mellitus without complications: Secondary | ICD-10-CM | POA: Diagnosis not present

## 2018-12-02 DIAGNOSIS — Z794 Long term (current) use of insulin: Secondary | ICD-10-CM | POA: Diagnosis not present

## 2018-12-02 DIAGNOSIS — Z6825 Body mass index (BMI) 25.0-25.9, adult: Secondary | ICD-10-CM | POA: Diagnosis not present

## 2018-12-05 NOTE — Progress Notes (Signed)
Carelink Summary Report / Loop Recorder 

## 2018-12-07 DIAGNOSIS — Z794 Long term (current) use of insulin: Secondary | ICD-10-CM | POA: Diagnosis not present

## 2018-12-07 DIAGNOSIS — E1165 Type 2 diabetes mellitus with hyperglycemia: Secondary | ICD-10-CM | POA: Diagnosis not present

## 2018-12-07 DIAGNOSIS — E1142 Type 2 diabetes mellitus with diabetic polyneuropathy: Secondary | ICD-10-CM | POA: Diagnosis not present

## 2018-12-07 DIAGNOSIS — Z85828 Personal history of other malignant neoplasm of skin: Secondary | ICD-10-CM | POA: Diagnosis not present

## 2018-12-07 DIAGNOSIS — Z87892 Personal history of anaphylaxis: Secondary | ICD-10-CM | POA: Diagnosis not present

## 2018-12-07 DIAGNOSIS — N390 Urinary tract infection, site not specified: Secondary | ICD-10-CM | POA: Diagnosis not present

## 2018-12-07 DIAGNOSIS — K5900 Constipation, unspecified: Secondary | ICD-10-CM | POA: Diagnosis not present

## 2018-12-07 DIAGNOSIS — C9590 Leukemia, unspecified not having achieved remission: Secondary | ICD-10-CM | POA: Diagnosis not present

## 2018-12-07 DIAGNOSIS — N529 Male erectile dysfunction, unspecified: Secondary | ICD-10-CM | POA: Diagnosis not present

## 2018-12-07 DIAGNOSIS — R32 Unspecified urinary incontinence: Secondary | ICD-10-CM | POA: Diagnosis not present

## 2018-12-23 DIAGNOSIS — D649 Anemia, unspecified: Secondary | ICD-10-CM | POA: Diagnosis not present

## 2018-12-23 DIAGNOSIS — E119 Type 2 diabetes mellitus without complications: Secondary | ICD-10-CM | POA: Diagnosis not present

## 2018-12-23 DIAGNOSIS — C91Z0 Other lymphoid leukemia not having achieved remission: Secondary | ICD-10-CM | POA: Diagnosis not present

## 2018-12-26 DIAGNOSIS — D649 Anemia, unspecified: Secondary | ICD-10-CM | POA: Diagnosis not present

## 2018-12-26 DIAGNOSIS — D472 Monoclonal gammopathy: Secondary | ICD-10-CM | POA: Diagnosis not present

## 2018-12-26 DIAGNOSIS — C91Z0 Other lymphoid leukemia not having achieved remission: Secondary | ICD-10-CM | POA: Diagnosis not present

## 2018-12-26 DIAGNOSIS — D630 Anemia in neoplastic disease: Secondary | ICD-10-CM | POA: Diagnosis not present

## 2018-12-31 ENCOUNTER — Ambulatory Visit (INDEPENDENT_AMBULATORY_CARE_PROVIDER_SITE_OTHER): Payer: Medicare HMO | Admitting: *Deleted

## 2018-12-31 DIAGNOSIS — I480 Paroxysmal atrial fibrillation: Secondary | ICD-10-CM | POA: Diagnosis not present

## 2019-01-01 LAB — CUP PACEART REMOTE DEVICE CHECK
Date Time Interrogation Session: 20200526023705
Implantable Pulse Generator Implant Date: 20190904

## 2019-01-03 DIAGNOSIS — M21612 Bunion of left foot: Secondary | ICD-10-CM | POA: Diagnosis not present

## 2019-01-03 DIAGNOSIS — E139 Other specified diabetes mellitus without complications: Secondary | ICD-10-CM | POA: Diagnosis not present

## 2019-01-03 DIAGNOSIS — M21611 Bunion of right foot: Secondary | ICD-10-CM | POA: Diagnosis not present

## 2019-01-03 DIAGNOSIS — M722 Plantar fascial fibromatosis: Secondary | ICD-10-CM | POA: Diagnosis not present

## 2019-01-08 NOTE — Progress Notes (Signed)
Carelink Summary Report / Loop Recorder 

## 2019-01-17 DIAGNOSIS — M722 Plantar fascial fibromatosis: Secondary | ICD-10-CM | POA: Diagnosis not present

## 2019-01-17 DIAGNOSIS — E139 Other specified diabetes mellitus without complications: Secondary | ICD-10-CM | POA: Diagnosis not present

## 2019-01-22 DIAGNOSIS — E119 Type 2 diabetes mellitus without complications: Secondary | ICD-10-CM | POA: Diagnosis not present

## 2019-01-22 DIAGNOSIS — Z7189 Other specified counseling: Secondary | ICD-10-CM | POA: Diagnosis not present

## 2019-01-22 DIAGNOSIS — D472 Monoclonal gammopathy: Secondary | ICD-10-CM | POA: Diagnosis not present

## 2019-01-22 DIAGNOSIS — K529 Noninfective gastroenteritis and colitis, unspecified: Secondary | ICD-10-CM | POA: Diagnosis not present

## 2019-01-22 DIAGNOSIS — Z6825 Body mass index (BMI) 25.0-25.9, adult: Secondary | ICD-10-CM | POA: Diagnosis not present

## 2019-01-22 DIAGNOSIS — Z794 Long term (current) use of insulin: Secondary | ICD-10-CM | POA: Diagnosis not present

## 2019-01-24 DIAGNOSIS — R69 Illness, unspecified: Secondary | ICD-10-CM | POA: Diagnosis not present

## 2019-01-30 DIAGNOSIS — C91Z0 Other lymphoid leukemia not having achieved remission: Secondary | ICD-10-CM | POA: Diagnosis not present

## 2019-01-30 DIAGNOSIS — M722 Plantar fascial fibromatosis: Secondary | ICD-10-CM | POA: Diagnosis not present

## 2019-01-30 DIAGNOSIS — Z7189 Other specified counseling: Secondary | ICD-10-CM | POA: Diagnosis not present

## 2019-01-30 DIAGNOSIS — D649 Anemia, unspecified: Secondary | ICD-10-CM | POA: Diagnosis not present

## 2019-01-30 DIAGNOSIS — G5603 Carpal tunnel syndrome, bilateral upper limbs: Secondary | ICD-10-CM | POA: Diagnosis not present

## 2019-01-30 DIAGNOSIS — C9110 Chronic lymphocytic leukemia of B-cell type not having achieved remission: Secondary | ICD-10-CM | POA: Diagnosis not present

## 2019-01-30 DIAGNOSIS — D472 Monoclonal gammopathy: Secondary | ICD-10-CM | POA: Diagnosis not present

## 2019-01-30 DIAGNOSIS — D630 Anemia in neoplastic disease: Secondary | ICD-10-CM | POA: Diagnosis not present

## 2019-01-30 DIAGNOSIS — E118 Type 2 diabetes mellitus with unspecified complications: Secondary | ICD-10-CM | POA: Diagnosis not present

## 2019-01-30 DIAGNOSIS — N400 Enlarged prostate without lower urinary tract symptoms: Secondary | ICD-10-CM | POA: Diagnosis not present

## 2019-01-30 DIAGNOSIS — Z0000 Encounter for general adult medical examination without abnormal findings: Secondary | ICD-10-CM | POA: Diagnosis not present

## 2019-01-31 DIAGNOSIS — M722 Plantar fascial fibromatosis: Secondary | ICD-10-CM | POA: Diagnosis not present

## 2019-02-02 LAB — CUP PACEART REMOTE DEVICE CHECK
Date Time Interrogation Session: 20200628034013
Implantable Pulse Generator Implant Date: 20190904

## 2019-02-03 ENCOUNTER — Ambulatory Visit (INDEPENDENT_AMBULATORY_CARE_PROVIDER_SITE_OTHER): Payer: Medicare HMO | Admitting: *Deleted

## 2019-02-03 DIAGNOSIS — I480 Paroxysmal atrial fibrillation: Secondary | ICD-10-CM

## 2019-02-10 ENCOUNTER — Ambulatory Visit: Payer: Medicare HMO | Admitting: Diagnostic Neuroimaging

## 2019-02-11 NOTE — Progress Notes (Signed)
Carelink Summary Report / Loop Recorder 

## 2019-02-20 DIAGNOSIS — K529 Noninfective gastroenteritis and colitis, unspecified: Secondary | ICD-10-CM | POA: Diagnosis not present

## 2019-02-20 DIAGNOSIS — Z6825 Body mass index (BMI) 25.0-25.9, adult: Secondary | ICD-10-CM | POA: Diagnosis not present

## 2019-02-20 DIAGNOSIS — Z794 Long term (current) use of insulin: Secondary | ICD-10-CM | POA: Diagnosis not present

## 2019-02-20 DIAGNOSIS — Z7189 Other specified counseling: Secondary | ICD-10-CM | POA: Diagnosis not present

## 2019-02-20 DIAGNOSIS — D472 Monoclonal gammopathy: Secondary | ICD-10-CM | POA: Diagnosis not present

## 2019-02-20 DIAGNOSIS — E119 Type 2 diabetes mellitus without complications: Secondary | ICD-10-CM | POA: Diagnosis not present

## 2019-03-05 ENCOUNTER — Telehealth: Payer: Self-pay

## 2019-03-05 ENCOUNTER — Telehealth: Payer: Self-pay | Admitting: Cardiovascular Disease

## 2019-03-05 DIAGNOSIS — C44319 Basal cell carcinoma of skin of other parts of face: Secondary | ICD-10-CM | POA: Diagnosis not present

## 2019-03-05 DIAGNOSIS — E139 Other specified diabetes mellitus without complications: Secondary | ICD-10-CM | POA: Diagnosis not present

## 2019-03-05 DIAGNOSIS — M722 Plantar fascial fibromatosis: Secondary | ICD-10-CM | POA: Diagnosis not present

## 2019-03-05 DIAGNOSIS — C4491 Basal cell carcinoma of skin, unspecified: Secondary | ICD-10-CM

## 2019-03-05 HISTORY — DX: Basal cell carcinoma of skin, unspecified: C44.91

## 2019-03-05 NOTE — Telephone Encounter (Signed)
Left a message for the patient to call back.  

## 2019-03-05 NOTE — Telephone Encounter (Signed)
Explained to pt the purpose for the loop recorder. The pt insists on discontinuing monitoring. He requested a call back from Dr. Sallyanne Kuster if he has any objections to his request, states that if he has not heard back from the office by Friday 7/31, he is going to unplug the home monitor.

## 2019-03-05 NOTE — Telephone Encounter (Signed)
Pt wants to discontinue his loop recorder. I let him talk to the device nurse Debe Coder, RN.

## 2019-03-05 NOTE — Telephone Encounter (Signed)
Ask him if he is willing to do downloads once every 3 or 6 months please.

## 2019-03-05 NOTE — Telephone Encounter (Signed)
He has agreed to Q6 month downloads and device clinic is aware.

## 2019-03-05 NOTE — Telephone Encounter (Signed)
Spoke with the patient. He stated that he is currently paying $75 a month for the [employer]. He does not feel like he has afib and cannot afford the $75 a month. He would like to know if he could either stop the downloads or do it once every 6 months. He stated that he was told he could do it once every 6 months but it has been every month. He does not feel like the Loop Recorder is needed. Message routed to the provider.

## 2019-03-05 NOTE — Telephone Encounter (Signed)
New Message      Pt is calling and says he seen Dr Sallyanne Kuster because they told him he had Afib. He says he doesn't think he has ever had Afib. He also says he doesn't want to continue with the Loop Recorder if he doesn't need it because it is expensive for him.    Please call

## 2019-03-05 NOTE — Telephone Encounter (Signed)
Pt agreed to send every 6 months. I told the pt I will let the nurse know and if she has any further questions she will call him back. Pt verbalized understanding.

## 2019-03-05 NOTE — Telephone Encounter (Signed)
LMOVM to ask if pt would be willing to do downloads every 3 or 6 months per Dr. Sallyanne Kuster request.

## 2019-03-06 NOTE — Telephone Encounter (Signed)
The patient has been made aware and has agreed and long it is a one time every 6 month download.

## 2019-03-06 NOTE — Telephone Encounter (Signed)
Left a message for the patient to call back.  

## 2019-03-06 NOTE — Telephone Encounter (Signed)
Follow Up  Patient returning call. Please give patient a call back.  

## 2019-03-07 ENCOUNTER — Ambulatory Visit (INDEPENDENT_AMBULATORY_CARE_PROVIDER_SITE_OTHER): Payer: Medicare HMO | Admitting: *Deleted

## 2019-03-07 DIAGNOSIS — I480 Paroxysmal atrial fibrillation: Secondary | ICD-10-CM | POA: Diagnosis not present

## 2019-03-07 LAB — CUP PACEART REMOTE DEVICE CHECK
Date Time Interrogation Session: 20200731041308
Implantable Pulse Generator Implant Date: 20190904

## 2019-03-13 ENCOUNTER — Ambulatory Visit: Payer: Medicare HMO | Admitting: Neurology

## 2019-03-13 ENCOUNTER — Encounter: Payer: Self-pay | Admitting: Neurology

## 2019-03-13 ENCOUNTER — Other Ambulatory Visit: Payer: Self-pay

## 2019-03-13 ENCOUNTER — Telehealth: Payer: Self-pay | Admitting: Neurology

## 2019-03-13 VITALS — BP 130/74 | HR 88 | Temp 97.1°F | Ht 69.5 in | Wt 175.0 lb

## 2019-03-13 DIAGNOSIS — R202 Paresthesia of skin: Secondary | ICD-10-CM

## 2019-03-13 DIAGNOSIS — E538 Deficiency of other specified B group vitamins: Secondary | ICD-10-CM

## 2019-03-13 NOTE — Telephone Encounter (Signed)
[employer] medicare order sent to [facility]. They will obtain the auth and reach out to the patient to schedule.  °

## 2019-03-13 NOTE — Progress Notes (Signed)
Carelink Summary Report / Loop Recorder 

## 2019-03-13 NOTE — Progress Notes (Signed)
Reason for visit: Paresthesias  Referring physician: Dr. Sherol Dade is a 78 y.o. male  History of present illness:  Tommy Peterson is a 78 year old left-handed white male with a history of granular cell leukemia.  The patient has been on methotrexate for a number of years taking 30 mg once a week.  He was told to go off of folic acid after several months of initial treatment, he has not been on folic acid since that time.  The patient has diabetes, he has a diabetic peripheral neuropathy, he was seen here in 2015 with symptoms of bilateral carpal tunnel syndrome.  He never had surgery done for this.  He has had some numbness in the feet, he has a mild gait instability issue associated with the neuropathy, he has had no falls in the last year.  Within the last 4 months, he began having shock sensations that occur distally in the arms or legs, never in the proximal portions of those limbs.  The patient may have daily events of migratory shocks affecting the hands or wrists or forearms or extremities below the knees and a hit 1 extremity at a time and then go onto another extremity later on.  The episodes last only a fraction of a second.  The patient has also noted that he has required increasing numbers of blood transfusions recently in the last several months, he has a chronic macrocytic anemia.  He denies issues controlling the bowels or the bladder.  He denies neck pain or pain down the arms.  He denies any particular head movement that would induce shocks down the arms or legs.  The patient reports no change in his motor strength.  He is sent to this office for further evaluation.  Past Medical History:  Diagnosis Date   Acute respiratory failure (HCC)    BPH (benign prostatic hyperplasia)    Carpal tunnel syndrome on both sides    Chronic diarrhea    intermittant due to chronic collagenous colitis   Chronic large granular lymphocytic leukemia (HCC) primary hemotologist-   [name]. 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--  treatment weekly methotraxate and transfusion's (last PRBCs 07-19-2017)   Collagenous colitis    chronic--- intermittant between diarrahea and constipation   ED (erectile dysfunction)    HCAP (healthcare-associated pneumonia)    History of adenomatous polyp of colon    History of Clostridium difficile colitis 05/2016   History of pneumonia 11/08/2017   CAP -- LLL---  12-19-2017  per pt no residual symptoms   History of SCC (squamous cell carcinoma) of skin    History of sepsis    11-20-2017 severe sepsis due to UTI;  10/ 2017sepsis due to c-diff colitis   Leucocytosis    chronic   Lower urinary tract symptoms (LUTS)    Macrocytic anemia    since 02/ 2015   MGUS (monoclonal gammopathy of unknown significance)    hemotologist-  [name] @ [facility]   Monoclonal paraproteinemia    Neuropathy, peripheral    OA (osteoarthritis)    Prostatic stone    Rash of face    RIGHT SIDE   Raynaud's syndrome    Thin skin    fragile   Transfusion-dependent anemia since 10/ 2017   last transfusion PRBCs 07-19-2017  per hemologist note dated 10-25-2017  Type 2 diabetes mellitus treated with insulin (Tommy Peterson)    followed by [name] (pcp)   Urinary hesitancy    Wears contact lenses    LEFT EYE ONLY    Past Surgical History:  Procedure Laterality Date   BONE MARROW BIOPSY Right 05-24-2016;  08-20-2014   CARDIOVASCULAR STRESS TEST  11-26-2012   [name]  @ [facility] ([place]   normal nuclear study w/ no ischemia/  normal LV function and wall motion , ef 60% (find in care everywhere,epic)   CATARACT EXTRACTION W/ INTRAOCULAR LENS  IMPLANT, BILATERAL  2015   CYSTOSCOPY WITH LITHOLAPAXY N/A 12/25/2017   Procedure: CYSTOSCOPY WITH LITHOLAPAXY AND FULGERATION;  Surgeon:  [name], MD;  [facility]: [facility];  Service: Urology;  Laterality: N/A;   INGUINAL HERNIA REPAIR Right 1980s   LOOP RECORDER INSERTION N/A 04/10/2018   Procedure: LOOP RECORDER INSERTION;  Surgeon: [name], MD;  Location: [facility];  Service: Cardiovascular;  Laterality: N/A;   PENILE PROSTHESIS IMPLANT  12-02-2015   dr [name] @ [facility] in [place] Left 08/2016   TONSILLECTOMY  child   TRANSURETHRAL RESECTION OF PROSTATE  2009   AND REPAIR RECURRENT RIGHT INGUINAL HERNIA    Family History  Problem Relation Age of Onset   Diabetes Mother    Heart failure Mother     Social history:  reports that he quit smoking about 22 years ago. His smoking use included cigarettes. He quit after 20.00 years of use. He has never used smokeless tobacco. He reports current alcohol use. He reports that he does not use drugs.  Medications:  Prior to Admission medications   Medication Sig Start Date End Date Taking? Authorizing Provider  acetaminophen (TYLENOL) 325 MG tablet Take 2 tablets (650 mg total) by mouth every 6 (six) hours as needed for mild pain (or Fever >/= 101). 11/24/17  Yes Elgergawy, Silver Huguenin, MD  cetirizine (ZYRTEC) 10 MG tablet Take 10 mg by mouth daily.   Yes [provider]  Cranberry 400 MG TABS Take by mouth daily.   Yes [provider]  docusate sodium (COLACE) 100 MG capsule Take 100 mg by mouth daily.   Yes [provider]  Insulin Glargine-Lixisenatide (SOLIQUA) 100-33 UNT-MCG/ML SOPN Inject 28 Units into the skin at bedtime.    Yes [provider]  methotrexate (RHEUMATREX) 2.5 MG tablet Take 30 mg by mouth every Tuesday.  07/19/17  Yes [provider]  NOVOLOG FLEXPEN 100 UNIT/ML FlexPen INJECT 4 UNITS TWICE A DAY BEFORE MEALS 02/21/19  Yes [provider]  pioglitazone (ACTOS) 15 MG tablet Take 15 mg by mouth every morning.  10/18/17  Yes [provider]  [place] Name: D Mannose 500 mg daily   Yes [provider]  UNABLE TO FIND Med Name: Levan Hurst D vitamin daily   Yes [provider]      Allergies  Allergen Reactions   Bee Venom Swelling   Metformin And Related Nausea And Vomiting   Sulfamethoxazole-Trimethoprim Nausea And Vomiting    ROS:  Out of a complete 14 system review of symptoms, the patient complains only of the following symptoms, and all other reviewed systems are negative.  Shock sensations Walking problems Anemia  Blood pressure 130/74, pulse 88, temperature (!) 97.1 F (36.2 C), height 5' 9.5" (1.765 m), weight 175 lb (79.4 kg).  Physical Exam  General: The patient is  alert and cooperative at the time of the examination.  Eyes: Pupils are equal, round, and reactive to light. Discs are flat bilaterally.  Neck: The neck is supple, no carotid bruits are noted.  Respiratory: The respiratory examination is clear.  Cardiovascular: The cardiovascular examination reveals a regular rate and rhythm, no obvious murmurs or rubs are noted.  Neuromuscular: The patient lacks about 25 degrees of full lateral rotation of the cervical spine with some restriction of flexion extension of the neck as well.  Skin: Extremities are without significant edema.  Neurologic Exam  Mental status: The patient is alert and oriented x 3 at the time of the examination. The patient has apparent normal recent and remote memory, with an apparently normal attention span and concentration ability.  Cranial nerves: Facial symmetry is present. There is good sensation of the face to pinprick and soft touch bilaterally. The strength of the facial muscles and the muscles to head turning and shoulder shrug are normal bilaterally. Speech is well enunciated, no aphasia or dysarthria is noted. Extraocular movements are full. Visual fields are full. The tongue is midline, and the patient has symmetric  elevation of the soft palate. No obvious hearing deficits are noted.  Motor: The motor testing reveals 5 over 5 strength of all 4 extremities. Good symmetric motor tone is noted throughout.  Sensory: Sensory testing is intact to pinprick, soft touch, vibration sensation, and position sense on the upper extremities.  With the lower extremities, there is a stocking pattern pinprick sensory deficit in the distal third of the legs bilaterally with mild impairment of vibration and position sense in both feet.  No evidence of extinction is noted.  Coordination: Cerebellar testing reveals good finger-nose-finger and heel-to-shin bilaterally.  Gait and station: Gait is slightly wide-based. Tandem gait is slightly unsteady. Romberg is negative. No drift is seen.  Reflexes: Deep tendon reflexes are symmetric and normal bilaterally, with the exception of depression or absence of ankle jerk reflexes bilaterally. Toes are downgoing bilaterally.   Assessment/Plan:  1.  Granular cell leukemia  2.  Diabetes, diabetic peripheral neuropathy  3.  Paresthesias, all 4 extremities  The patient has had a concurrent issue with worsening anemia and shock sensations that occur distally throughout the extremities.  The patient is on methotrexate but is not on folic acid.  The patient will contact his oncologist to find out if this may be required at this point.  The patient will be sent for further blood work today to look for B12 levels and copper levels.  CT of the cervical spine will be done to exclude spinal cord compression.  The patient will follow-up in 4 months.  Currently, he does not believe that he requires medication such as gabapentin for the paresthesias.  Jill Alexanders MD 03/13/2019 9:25 AM  [facility] [address] [place], [place] [address]  Phone [phone] Fax [phone]

## 2019-03-16 LAB — FOLATE RBC
Folate, Hemolysate: 245 ng/mL
Folate, RBC: 814 ng/mL (ref >498)
Hematocrit: 30.1 % — ABNORMAL LOW (ref 37.5–51.0)

## 2019-03-16 LAB — COPPER, SERUM: Copper: 92 ug/dL (ref 72–166)

## 2019-03-16 LAB — VITAMIN B1: Thiamine: 171 nmol/L (ref 66.5–200.0)

## 2019-03-16 LAB — VITAMIN B12: Vitamin B-12: 692 pg/mL (ref 232–1245)

## 2019-03-17 ENCOUNTER — Telehealth: Payer: Self-pay

## 2019-03-17 DIAGNOSIS — G4489 Other headache syndrome: Secondary | ICD-10-CM

## 2019-03-17 NOTE — Telephone Encounter (Signed)
-----   Message from Kathrynn Ducking, MD sent at 03/16/2019  1:00 PM EDT ----- Blood work is unremarkable, folate level is within normal limits.  Please call the patient. ----- Message ----- From: Lavone Neri Lab Results In Sent: 03/14/2019   7:37 AM EDT To: Kathrynn Ducking, MD

## 2019-03-17 NOTE — Telephone Encounter (Signed)
I contacted the pt and advised of results. He verbalized understanding and requested MD to review MyChart message that was sent today.  I advised the message has been fwd to Dr. Jannifer Franklin he will review soon. Pt was agreeable.

## 2019-03-18 NOTE — Telephone Encounter (Signed)
I reached out to the pt. He states on 03/14/19 he started to have severe headaches and pain at the top of his head when he yawns. Pt reports he has tried otc aspirin to help with headaches and have not received any relief. Pt also reports a red spot at his hair line. Pt reports the beginning of August he had a basal cell removed in the middle of his forehead.   Pt wanted to know if MD would be ok with ordering a CT scan of his brain along with the CT of spine?   Pt's best call back # is [phone].

## 2019-03-18 NOTE — Telephone Encounter (Signed)
Pt is asking for a call from RN Romelle Starcher to discuss the headaches he has been having.  Please call

## 2019-03-18 NOTE — Telephone Encounter (Signed)
I called the patient.  He recently started having severe generalized headache with yawning, he is having a dull headache otherwise.  He feels cognitive clouding, he is concerned about these new issues, I will order CT of the head with and without contrast.  If the symptoms severely worsen, he is to go to the emergency room.

## 2019-03-18 NOTE — Addendum Note (Signed)
Addended by: Kathrynn Ducking on: 03/18/2019 05:25 PM   Modules accepted: Orders

## 2019-03-19 ENCOUNTER — Telehealth: Payer: Self-pay | Admitting: Neurology

## 2019-03-19 NOTE — Telephone Encounter (Signed)
[employer] medicare order sent to [facility]. They will obtain the auth and reach out to the patient to schedule.  °

## 2019-03-20 ENCOUNTER — Other Ambulatory Visit: Payer: Self-pay

## 2019-03-20 ENCOUNTER — Ambulatory Visit
Admission: RE | Admit: 2019-03-20 | Discharge: 2019-03-20 | Disposition: A | Discharge status: Home / Self Care | Payer: Medicare HMO | Source: Ambulatory Visit | Attending: Neurology | Admitting: Neurology

## 2019-03-20 DIAGNOSIS — G4489 Other headache syndrome: Secondary | ICD-10-CM | POA: Diagnosis not present

## 2019-03-20 MED ORDER — IOPAMIDOL (ISOVUE-300) INJECTION 61%
75.0000 mL | Freq: Once | INTRAVENOUS | Status: AC | PRN
  Administered 2019-03-20: 75 mL via INTRAVENOUS

## 2019-03-22 ENCOUNTER — Telehealth: Payer: Self-pay | Admitting: Neurology

## 2019-03-22 DIAGNOSIS — Z5181 Encounter for therapeutic drug level monitoring: Secondary | ICD-10-CM

## 2019-03-22 DIAGNOSIS — G4489 Other headache syndrome: Secondary | ICD-10-CM

## 2019-03-22 MED ORDER — GABAPENTIN 100 MG PO CAPS: 100.0000 mg | ORAL_CAPSULE | Freq: Three times a day (TID) | ORAL | 3 refills | Status: DC

## 2019-03-22 NOTE — Telephone Encounter (Signed)
I called the patient.  The CT scan of the brain did not show any acute changes from 2015.  The patient began having headaches the day after I saw him in the office, her headaches are in the front and top of the head and back of the head, and there is a pink spot on his forehead that is sore.  He is not running fevers, his blood sugars are under good control.  I will bring him in for further blood work, we will start gabapentin, if the headaches continue we may need to consider lumbar puncture.  We will start gabapentin for the headache now.   CT head 03/21/19:  IMPRESSION: Unremarkable CT scan of the head without contrast showing only mild age-appropriate changes of chronic microvascular ischemia and generalized cerebral atrophy.  No significant change compared with previous CT head dated 12/21/2013.

## 2019-03-24 ENCOUNTER — Ambulatory Visit
Admission: RE | Admit: 2019-03-24 | Discharge: 2019-03-24 | Disposition: A | Discharge status: Home / Self Care | Payer: Medicare HMO | Source: Ambulatory Visit | Attending: Neurology | Admitting: Neurology

## 2019-03-24 ENCOUNTER — Other Ambulatory Visit: Payer: Self-pay

## 2019-03-24 DIAGNOSIS — M5030 Other cervical disc degeneration, unspecified cervical region: Secondary | ICD-10-CM | POA: Diagnosis not present

## 2019-03-24 DIAGNOSIS — R202 Paresthesia of skin: Secondary | ICD-10-CM

## 2019-03-24 IMAGING — CT CT CERVICAL SPINE WITHOUT CONTRAST
3 of 5 series · 11 of 33 positions shown, 13 images · non-contrast
Comparison: CT chest [DATE]. No cervical spine imaging
available for comparison.

CLINICAL DATA: Extremity paresthesias

EXAM:
CT CERVICAL SPINE WITHOUT CONTRAST
TECHNIQUE: Multidetector CT imaging of the cervical spine was performed without
intravenous contrast. Multiplanar CT image reconstructions were also
generated.

[Series 3: c-spine 2.00 br60 s3 axial bone · axial · 0.23mm/px · z∈[-691,-597]mm · 3 of 95 slices shown, 4 images]
[im 24/95  soft-tissue]
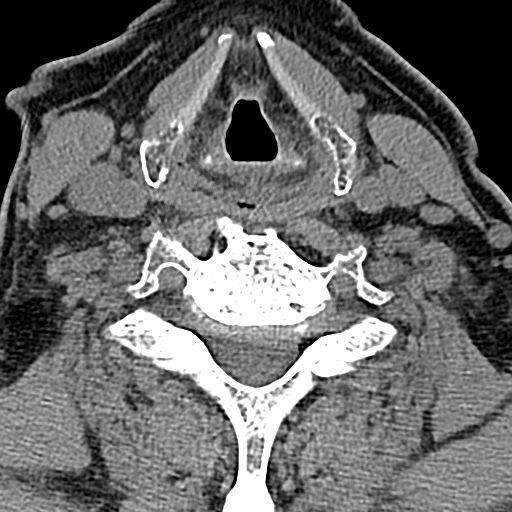
[im 24/95  bone]
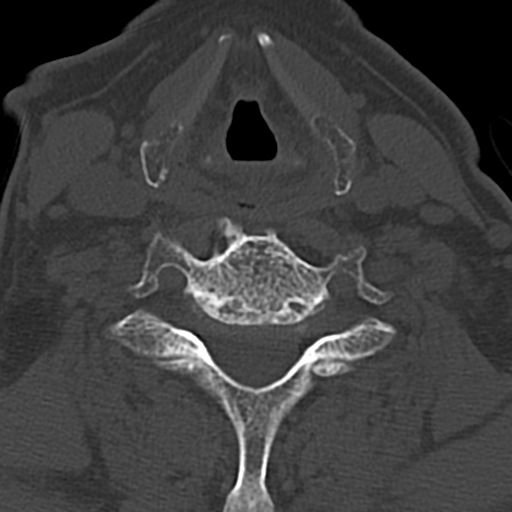
[im 48/95  bone]
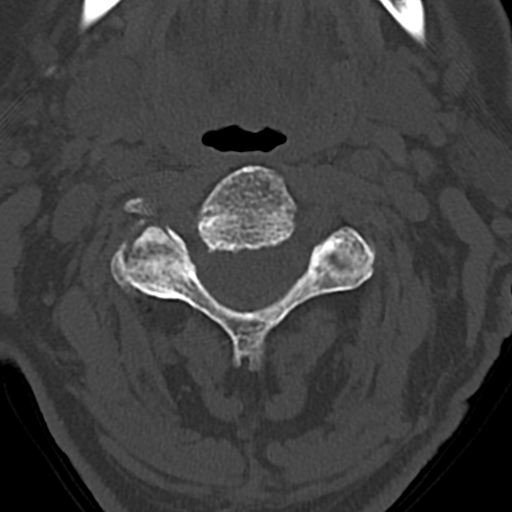
[im 71/95  bone]
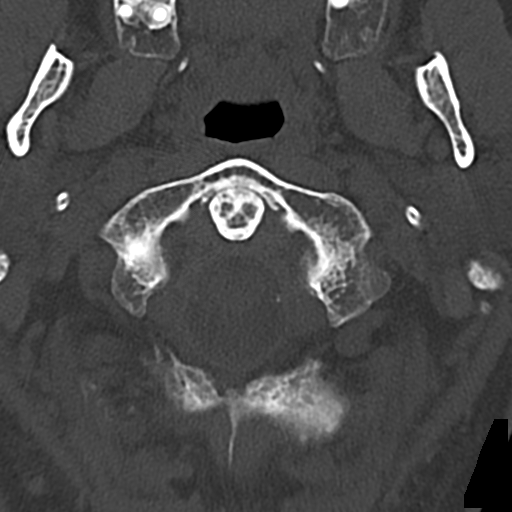

[Series 5: c-spine 2.00 br60 s3 sag bone · sagittal · 0.23mm/px · 5 of 70 slices shown, 6 images]
[im 24/70  bone]
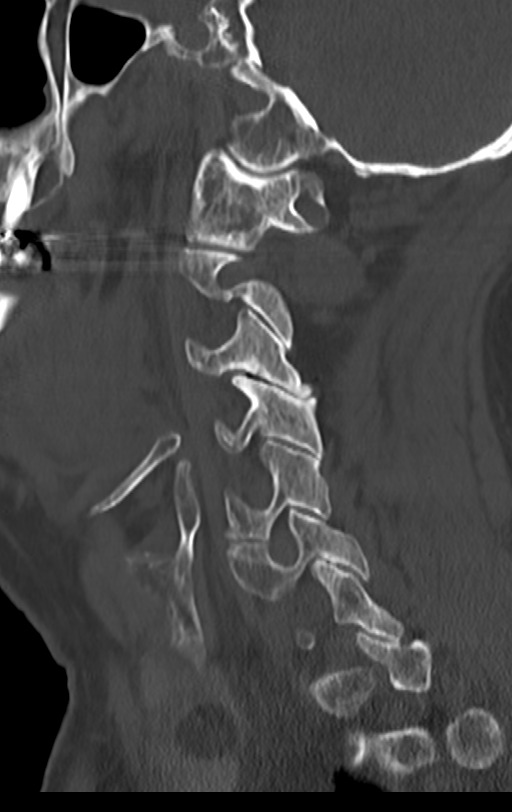
[im 29/70  bone]
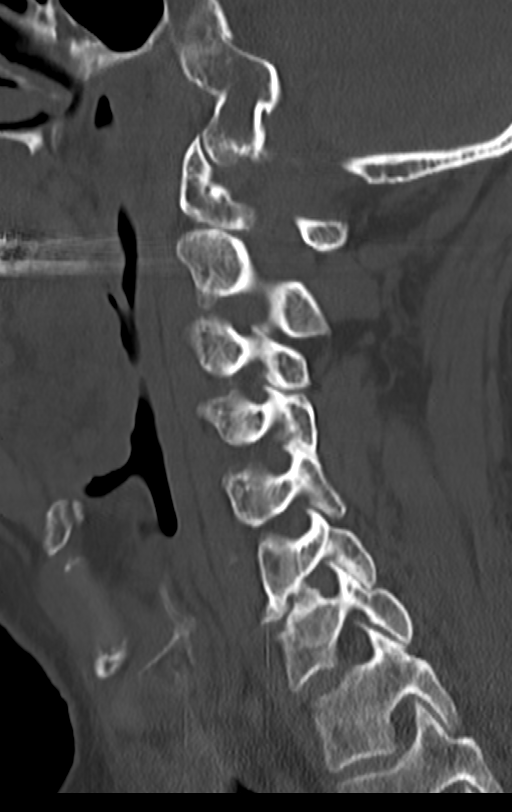
[im 35/70  soft-tissue]
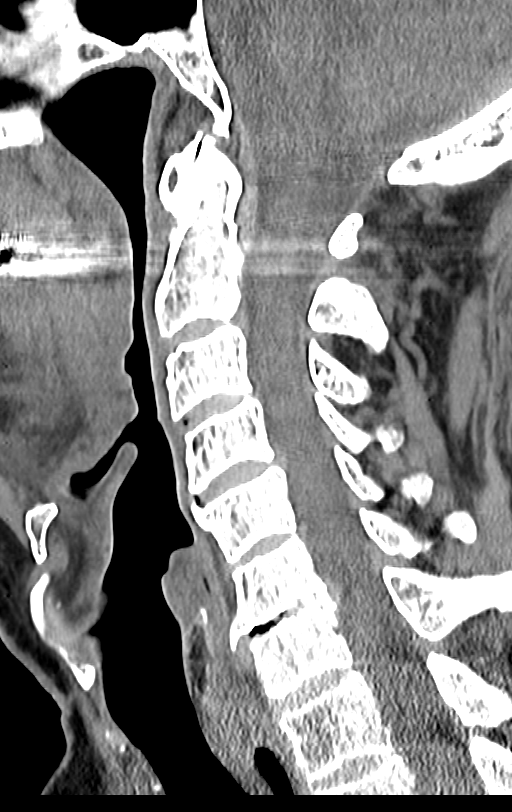
[im 35/70  bone]
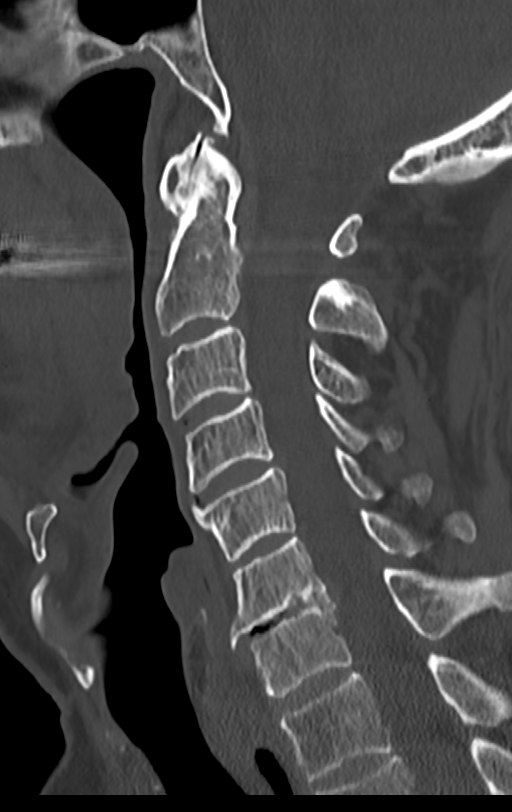
[im 41/70  bone]
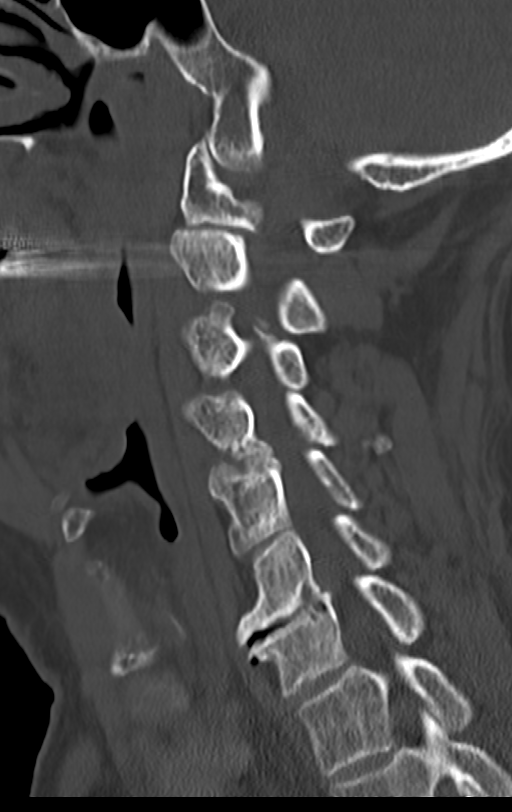
[im 47/70  bone]
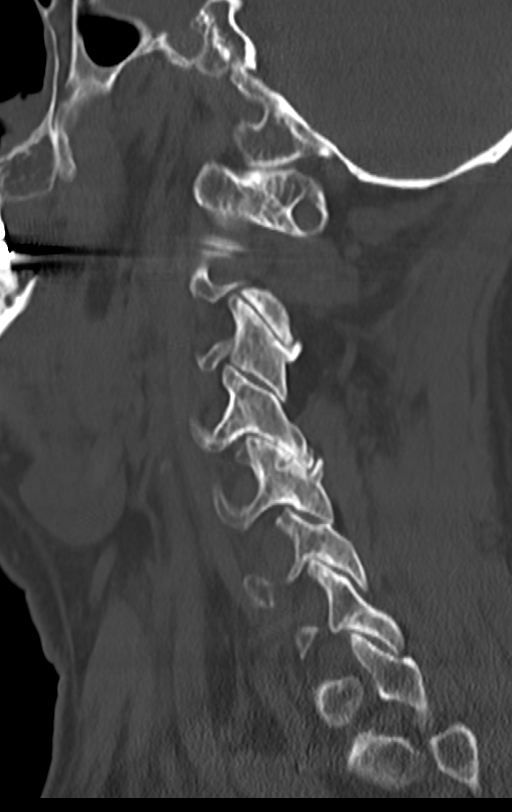

[Series 7: c-spine 2.00 hr60 s3 cor bone · coronal · 0.28mm/px · 3 of 59 slices shown]
[im 12/59  bone]
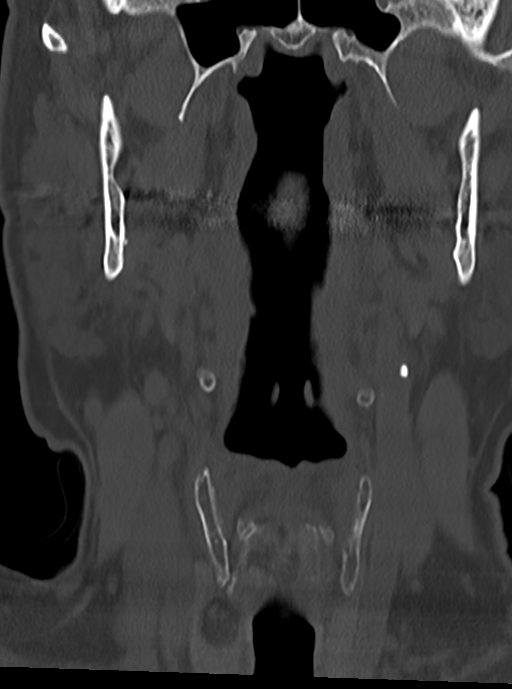
[im 24/59  bone]
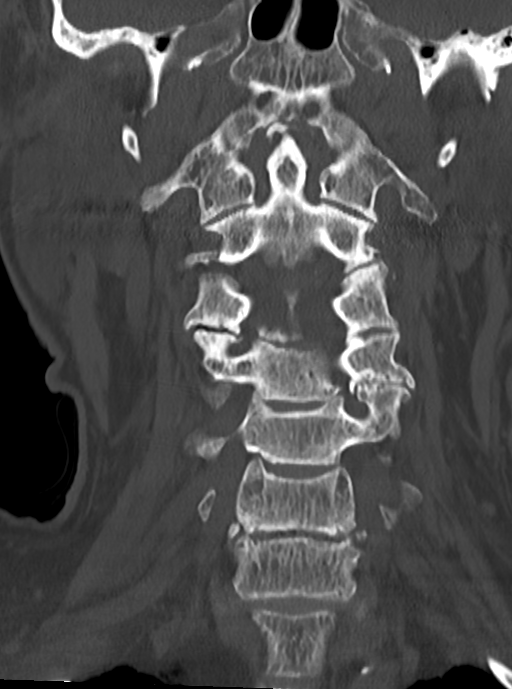
[im 35/59  bone]
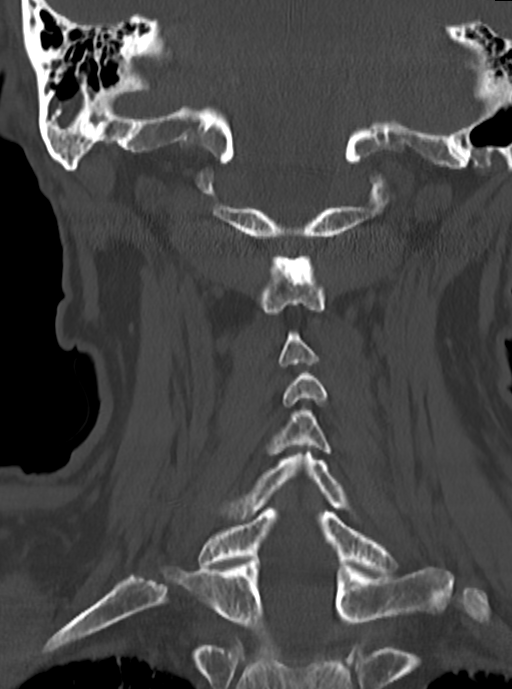

[11 of 33 positions shown; findings below may reference images not displayed]

FINDINGS: Alignment: Mild straightening of the cervical lordosis, which may be
positional. No static listhesis.

Skull base and vertebrae: No acute fracture. No primary bone lesion
or focal pathologic process.

Soft tissues and spinal canal: No prevertebral fluid or swelling. No
visible canal hematoma.

Disc levels:

C2-C3: Left worse than right facet and uncovertebral arthropathy
without significant foraminal or canal narrowing.

C3-C4: Right worse than left facet and uncovertebral arthropathy
result in mild right bony neural foraminal narrowing. No appreciable
canal stenosis.

C4-C5: Small posterior disc osteophyte complex with bilateral facet
and uncovertebral arthropathy resulting in mild left bony foraminal
narrowing. No appreciable canal stenosis.

C5-C6: Small posterior disc osteophyte complex with mild bilateral
facet and uncovertebral arthropathy. No appreciable canal or
foraminal stenosis.

C6-C7: Advanced intervertebral disc height loss with posterior disc
osteophyte complex. No appreciable canal or foraminal stenosis.

C7-T1: No appreciable canal or foraminal stenosis.

Upper chest: Predominantly fat density nodule within the right
thyroid lobe measuring 2.0 x 1.1 cm, not significantly changed in
appearance when compared with chest CT [DATE]. Mild biapical
pleuroparenchymal scarring. Atherosclerotic calcifications are
noted.

Other: None.
IMPRESSION: 1. Mild multilevel degenerative changes of the cervical spine
resulting in mild bony neural foraminal narrowing on the right at
C3-4 and on the left at C4-5. No appreciable canal stenosis.
2. Unchanged appearance of 2.0 cm right thyroid lobe nodule compared
to [DATE]. Lesion is predominantly of fat density suggestive of
an adenolipoma. Findings can be further evaluated with dedicated
thyroid ultrasound, if not previously performed.

## 2019-03-24 NOTE — Telephone Encounter (Signed)
[employer] medicare Josem Kaufmann: [id number] (exp. 03/21/19 to 09/17/19) patient is scheduled at GI for 03/24/19.

## 2019-03-25 ENCOUNTER — Telehealth: Payer: Self-pay

## 2019-03-25 NOTE — Telephone Encounter (Signed)
-----   Message from Marval Regal, RN sent at 03/25/2019  4:11 PM EDT -----  ----- Message ----- From: Britt Bottom, MD Sent: 03/25/2019  12:22 PM EDT To: Anda Latina, RN  Please let him know the CT showed multilevel arthritic changes but no compression of spinal cord or nerve root.  He also has a nodule in the thyroid but it is unchanged compared to a CT chest from 2018

## 2019-03-25 NOTE — Telephone Encounter (Signed)
I gave pt results of Ct cervical spine and Ct head results. PT stated he still has some headaches. I stated he was prescribed gabapentin. Pt stated he never started the medication because of the numerous side effects.He will continue to take motrin for the headache pain. I stated message will be sent to Dr. Arlie Solomons.PT verbalized understanding.

## 2019-03-25 NOTE — Telephone Encounter (Signed)
Notes recorded by Marval Regal, RN on 03/25/2019 at 5:02 PM EDT  I called pt about the Ct cervical spine results. Patient stated the CT showed multilevel arthritic changes but no compression of spinal cord or nerve root. HE has a nodule in the thyroid but is unchanged compared to CT chest from 2018. PT verbalized understanding.  ------

## 2019-03-30 NOTE — Telephone Encounter (Signed)
I called the patient.  I explained that the gabapentin is extremely safe.  He claims that the pain level is not extremely severe, he does not want to take anything which is fine.  He has been taking ibuprofen off and on if needed.  The onset of the discomfort occurred in June after a biopsy, he could have a neuralgia pain from a small sensory nerve, when he yawns, the pain jabs into the head.  If the pain does not improve over the next 3 months, he will call me.

## 2019-04-03 DIAGNOSIS — Z6825 Body mass index (BMI) 25.0-25.9, adult: Secondary | ICD-10-CM | POA: Diagnosis not present

## 2019-04-03 DIAGNOSIS — K529 Noninfective gastroenteritis and colitis, unspecified: Secondary | ICD-10-CM | POA: Diagnosis not present

## 2019-04-03 DIAGNOSIS — Z794 Long term (current) use of insulin: Secondary | ICD-10-CM | POA: Diagnosis not present

## 2019-04-03 DIAGNOSIS — E119 Type 2 diabetes mellitus without complications: Secondary | ICD-10-CM | POA: Diagnosis not present

## 2019-04-03 DIAGNOSIS — D472 Monoclonal gammopathy: Secondary | ICD-10-CM | POA: Diagnosis not present

## 2019-04-03 DIAGNOSIS — Z7189 Other specified counseling: Secondary | ICD-10-CM | POA: Diagnosis not present

## 2019-04-03 DIAGNOSIS — E118 Type 2 diabetes mellitus with unspecified complications: Secondary | ICD-10-CM | POA: Diagnosis not present

## 2019-04-09 ENCOUNTER — Telehealth: Payer: Self-pay | Admitting: Cardiovascular Disease

## 2019-04-09 NOTE — Telephone Encounter (Signed)
Spoke with patient. Able to send manual transmission. Advised to repeat this process on 09/05/19, explained we will call if manual transmission is not received that day. Pt verbalizes understanding. He will unplug monitor today and is aware manual transmission on 09/05/19 will be billed. No further questions at this time.  Manual transmission reviewed. "Tachy" episodes false--TWOS. 1 "AF" episode--false, TWOS and PVCs.

## 2019-04-09 NOTE — Telephone Encounter (Signed)
°  1. Has your device fired? no  2. Is you device beeping? no  3. Are you experiencing draining or swelling at device site? no  4. Are you calling to see if we received your device transmission? Patient is calling about his loop recorder, he stated he received a message about a transmission. He is a little confused about it as he spoke to Dr. Sallyanne Kuster about this because he can't afford to have a transmission sent every month, and they agreed to do it every 6 mos.   5. Have you passed out? no    Please route to [place]

## 2019-04-09 NOTE — Telephone Encounter (Signed)
Spoke with patient regarding his concerns. Pt had previously requested to only have his ILR checked every 6 months due to cost (see 02/2019 phone notes). However, pt has kept his monitor plugged in so monthly summary reports are still being scheduled (next one due today). Advised pt that I have canceled his remote appointment for today. Offered that he can either come in every 6 months to DC, or send a manual transmission from his monitor every 6 months (then unplug again). Pt would prefer to send a scheduled manual transmission.  Attempted to assist with a manual transmission today--error code 52. Advised to allow reader to charge, will call back later this morning.  Notes in [facility] updated, removed from disconnected monitor list in [place]. Next remote appointment in 08/2019 as last billed summary report was on 03/07/19.

## 2019-04-14 ENCOUNTER — Other Ambulatory Visit: Payer: Self-pay | Admitting: Neurology

## 2019-04-14 DIAGNOSIS — G4489 Other headache syndrome: Secondary | ICD-10-CM

## 2019-04-14 DIAGNOSIS — R202 Paresthesia of skin: Secondary | ICD-10-CM

## 2019-04-17 DIAGNOSIS — D649 Anemia, unspecified: Secondary | ICD-10-CM | POA: Diagnosis not present

## 2019-04-17 DIAGNOSIS — N3000 Acute cystitis without hematuria: Secondary | ICD-10-CM | POA: Diagnosis not present

## 2019-04-17 DIAGNOSIS — D630 Anemia in neoplastic disease: Secondary | ICD-10-CM | POA: Diagnosis not present

## 2019-04-17 DIAGNOSIS — C91Z0 Other lymphoid leukemia not having achieved remission: Secondary | ICD-10-CM | POA: Diagnosis not present

## 2019-04-17 DIAGNOSIS — D472 Monoclonal gammopathy: Secondary | ICD-10-CM | POA: Diagnosis not present

## 2019-04-17 DIAGNOSIS — Z79899 Other long term (current) drug therapy: Secondary | ICD-10-CM | POA: Diagnosis not present

## 2019-04-22 DIAGNOSIS — Z1159 Encounter for screening for other viral diseases: Secondary | ICD-10-CM | POA: Diagnosis not present

## 2019-04-22 DIAGNOSIS — N390 Urinary tract infection, site not specified: Secondary | ICD-10-CM | POA: Diagnosis not present

## 2019-04-24 DIAGNOSIS — L570 Actinic keratosis: Secondary | ICD-10-CM | POA: Diagnosis not present

## 2019-04-24 DIAGNOSIS — C44311 Basal cell carcinoma of skin of nose: Secondary | ICD-10-CM | POA: Diagnosis not present

## 2019-04-29 DIAGNOSIS — N3943 Post-void dribbling: Secondary | ICD-10-CM | POA: Diagnosis not present

## 2019-04-29 DIAGNOSIS — R509 Fever, unspecified: Secondary | ICD-10-CM | POA: Diagnosis not present

## 2019-04-29 DIAGNOSIS — C91Z0 Other lymphoid leukemia not having achieved remission: Secondary | ICD-10-CM | POA: Diagnosis not present

## 2019-04-29 DIAGNOSIS — Z87440 Personal history of urinary (tract) infections: Secondary | ICD-10-CM | POA: Diagnosis not present

## 2019-04-30 ENCOUNTER — Telehealth: Payer: Self-pay

## 2019-04-30 NOTE — Telephone Encounter (Signed)
Left message for patient to remind of missed regarding disconnected monitor

## 2019-05-02 DIAGNOSIS — E119 Type 2 diabetes mellitus without complications: Secondary | ICD-10-CM | POA: Diagnosis not present

## 2019-05-02 DIAGNOSIS — H524 Presbyopia: Secondary | ICD-10-CM | POA: Diagnosis not present

## 2019-05-13 NOTE — Telephone Encounter (Signed)
Per Dr. Loletha Grayer the pt is to send every 6 months and was asked to unplug his monitor until next schedule transmission so he would not get charge early for insurance purpose.

## 2019-05-15 DIAGNOSIS — E118 Type 2 diabetes mellitus with unspecified complications: Secondary | ICD-10-CM | POA: Diagnosis not present

## 2019-05-15 DIAGNOSIS — C91Z0 Other lymphoid leukemia not having achieved remission: Secondary | ICD-10-CM | POA: Diagnosis not present

## 2019-05-15 DIAGNOSIS — D472 Monoclonal gammopathy: Secondary | ICD-10-CM | POA: Diagnosis not present

## 2019-05-15 DIAGNOSIS — Z87440 Personal history of urinary (tract) infections: Secondary | ICD-10-CM | POA: Diagnosis not present

## 2019-05-15 DIAGNOSIS — K529 Noninfective gastroenteritis and colitis, unspecified: Secondary | ICD-10-CM | POA: Diagnosis not present

## 2019-05-15 DIAGNOSIS — Z23 Encounter for immunization: Secondary | ICD-10-CM | POA: Diagnosis not present

## 2019-05-15 DIAGNOSIS — Z7189 Other specified counseling: Secondary | ICD-10-CM | POA: Diagnosis not present

## 2019-05-15 DIAGNOSIS — E119 Type 2 diabetes mellitus without complications: Secondary | ICD-10-CM | POA: Diagnosis not present

## 2019-05-15 DIAGNOSIS — Z794 Long term (current) use of insulin: Secondary | ICD-10-CM | POA: Diagnosis not present

## 2019-05-15 DIAGNOSIS — Z6825 Body mass index (BMI) 25.0-25.9, adult: Secondary | ICD-10-CM | POA: Diagnosis not present

## 2019-06-19 DIAGNOSIS — D630 Anemia in neoplastic disease: Secondary | ICD-10-CM | POA: Diagnosis not present

## 2019-06-19 DIAGNOSIS — D472 Monoclonal gammopathy: Secondary | ICD-10-CM | POA: Diagnosis not present

## 2019-06-19 DIAGNOSIS — C91Z0 Other lymphoid leukemia not having achieved remission: Secondary | ICD-10-CM | POA: Diagnosis not present

## 2019-06-26 DIAGNOSIS — L570 Actinic keratosis: Secondary | ICD-10-CM | POA: Diagnosis not present

## 2019-06-26 DIAGNOSIS — Z85828 Personal history of other malignant neoplasm of skin: Secondary | ICD-10-CM | POA: Diagnosis not present

## 2019-06-26 DIAGNOSIS — C44311 Basal cell carcinoma of skin of nose: Secondary | ICD-10-CM | POA: Diagnosis not present

## 2019-06-26 DIAGNOSIS — Z87891 Personal history of nicotine dependence: Secondary | ICD-10-CM | POA: Diagnosis not present

## 2019-07-14 ENCOUNTER — Ambulatory Visit: Payer: Medicare HMO | Admitting: Neurology

## 2019-07-22 DIAGNOSIS — Z85828 Personal history of other malignant neoplasm of skin: Secondary | ICD-10-CM | POA: Diagnosis not present

## 2019-07-22 DIAGNOSIS — C44311 Basal cell carcinoma of skin of nose: Secondary | ICD-10-CM | POA: Diagnosis not present

## 2019-08-12 DIAGNOSIS — E119 Type 2 diabetes mellitus without complications: Secondary | ICD-10-CM | POA: Diagnosis not present

## 2019-08-18 DIAGNOSIS — Z794 Long term (current) use of insulin: Secondary | ICD-10-CM | POA: Diagnosis not present

## 2019-08-18 DIAGNOSIS — Z6825 Body mass index (BMI) 25.0-25.9, adult: Secondary | ICD-10-CM | POA: Diagnosis not present

## 2019-08-18 DIAGNOSIS — E1140 Type 2 diabetes mellitus with diabetic neuropathy, unspecified: Secondary | ICD-10-CM | POA: Diagnosis not present

## 2019-08-18 DIAGNOSIS — Z87440 Personal history of urinary (tract) infections: Secondary | ICD-10-CM | POA: Diagnosis not present

## 2019-08-18 DIAGNOSIS — K529 Noninfective gastroenteritis and colitis, unspecified: Secondary | ICD-10-CM | POA: Diagnosis not present

## 2019-08-18 DIAGNOSIS — D472 Monoclonal gammopathy: Secondary | ICD-10-CM | POA: Diagnosis not present

## 2019-08-18 DIAGNOSIS — Z7189 Other specified counseling: Secondary | ICD-10-CM | POA: Diagnosis not present

## 2019-08-18 DIAGNOSIS — C91Z0 Other lymphoid leukemia not having achieved remission: Secondary | ICD-10-CM | POA: Diagnosis not present

## 2019-08-20 DIAGNOSIS — Z85828 Personal history of other malignant neoplasm of skin: Secondary | ICD-10-CM | POA: Diagnosis not present

## 2019-08-20 DIAGNOSIS — L905 Scar conditions and fibrosis of skin: Secondary | ICD-10-CM | POA: Diagnosis not present

## 2019-08-20 DIAGNOSIS — L570 Actinic keratosis: Secondary | ICD-10-CM | POA: Diagnosis not present

## 2019-08-20 DIAGNOSIS — L578 Other skin changes due to chronic exposure to nonionizing radiation: Secondary | ICD-10-CM | POA: Diagnosis not present

## 2019-08-28 DIAGNOSIS — C91Z0 Other lymphoid leukemia not having achieved remission: Secondary | ICD-10-CM | POA: Diagnosis not present

## 2019-08-28 DIAGNOSIS — D649 Anemia, unspecified: Secondary | ICD-10-CM | POA: Diagnosis not present

## 2019-08-28 DIAGNOSIS — D630 Anemia in neoplastic disease: Secondary | ICD-10-CM | POA: Diagnosis not present

## 2019-08-28 DIAGNOSIS — Z79899 Other long term (current) drug therapy: Secondary | ICD-10-CM | POA: Diagnosis not present

## 2019-08-28 DIAGNOSIS — D472 Monoclonal gammopathy: Secondary | ICD-10-CM | POA: Diagnosis not present

## 2019-08-28 DIAGNOSIS — C9110 Chronic lymphocytic leukemia of B-cell type not having achieved remission: Secondary | ICD-10-CM | POA: Diagnosis not present

## 2019-09-05 ENCOUNTER — Telehealth: Payer: Self-pay

## 2019-09-05 NOTE — Telephone Encounter (Signed)
I let the pt know his monitor sent [place] an Alert for A-fib and tachy episodes per [employer]. I asked him to send a manual transmission with his home monitor for the nurse to review. I told him if I see the transmission on Monday the nurse will review it and give him a call back. If I do not see the transmission then I will give him a call around 9 or 10 am to help him send a transmission. The pt verbalized understanding and thanked me for the call.

## 2019-09-08 ENCOUNTER — Telehealth: Payer: Self-pay | Admitting: Emergency Medicine

## 2019-09-08 NOTE — Telephone Encounter (Signed)
Received alert with no recordings. Remote transmission sent 09/08/19. Implanted for crypotogenic stroke. Episode recorded 08/20/19 with  questionable episode  AF with controlled v-rates that lasted 2 minutes. Will send to Dr Recardo Evangelist for evaluation.

## 2019-09-08 NOTE — Telephone Encounter (Signed)
Lot's of PACs, but distinct P waves are seen on most beats. Not atrial fibrillation. Thank you

## 2019-09-08 NOTE — Telephone Encounter (Signed)
Transmission received. 09-08-2019

## 2019-09-09 ENCOUNTER — Telehealth: Payer: Self-pay | Admitting: Hematology

## 2019-09-09 NOTE — Telephone Encounter (Signed)
Received a new pt referral from [facility] for dx Large granular lymphocytic leukemia. Tommy Peterson has been cld and scheduled to see Dr. Irene Limbo on 2/9 at 11am. Pt aware to arrive 15 minutes early.

## 2019-09-11 DIAGNOSIS — K5900 Constipation, unspecified: Secondary | ICD-10-CM | POA: Diagnosis not present

## 2019-09-11 DIAGNOSIS — R1013 Epigastric pain: Secondary | ICD-10-CM | POA: Diagnosis not present

## 2019-09-15 DIAGNOSIS — R69 Illness, unspecified: Secondary | ICD-10-CM | POA: Diagnosis not present

## 2019-09-16 ENCOUNTER — Other Ambulatory Visit: Payer: Self-pay

## 2019-09-16 ENCOUNTER — Inpatient Hospital Stay: Payer: Medicare HMO

## 2019-09-16 ENCOUNTER — Inpatient Hospital Stay: Payer: Medicare HMO | Attending: Hematology | Admitting: Hematology

## 2019-09-16 VITALS — BP 134/63 | HR 65 | Temp 98.0°F | Resp 18 | Ht 69.5 in | Wt 175.6 lb

## 2019-09-16 DIAGNOSIS — D472 Monoclonal gammopathy: Secondary | ICD-10-CM | POA: Diagnosis not present

## 2019-09-16 DIAGNOSIS — Z79899 Other long term (current) drug therapy: Secondary | ICD-10-CM | POA: Diagnosis not present

## 2019-09-16 DIAGNOSIS — C91Z0 Other lymphoid leukemia not having achieved remission: Secondary | ICD-10-CM

## 2019-09-16 DIAGNOSIS — D649 Anemia, unspecified: Secondary | ICD-10-CM | POA: Diagnosis not present

## 2019-09-16 LAB — CBC WITH DIFFERENTIAL/PLATELET
Abs Immature Granulocytes: 0.09 10*3/uL — ABNORMAL HIGH (ref 0.00–0.07)
Basophils Absolute: 0 10*3/uL (ref 0.0–0.1)
Basophils Relative: 0 %
Eosinophils Absolute: 0 10*3/uL (ref 0.0–0.5)
Eosinophils Relative: 0 %
HCT: 35.7 % — ABNORMAL LOW (ref 39.0–52.0)
Hemoglobin: 12.2 g/dL — ABNORMAL LOW (ref 13.0–17.0)
Immature Granulocytes: 1 %
Lymphocytes Relative: 49 %
Lymphs Abs: 4.3 10*3/uL — ABNORMAL HIGH (ref 0.7–4.0)
MCH: 40.8 pg — ABNORMAL HIGH (ref 26.0–34.0)
MCHC: 34.2 g/dL (ref 30.0–36.0)
MCV: 119.4 fL — ABNORMAL HIGH (ref 80.0–100.0)
Monocytes Absolute: 1 10*3/uL (ref 0.1–1.0)
Monocytes Relative: 11 %
Neutro Abs: 3.4 10*3/uL (ref 1.7–7.7)
Neutrophils Relative %: 39 %
Platelets: 284 10*3/uL (ref 150–400)
RBC: 2.99 MIL/uL — ABNORMAL LOW (ref 4.22–5.81)
RDW: 15.6 % — ABNORMAL HIGH (ref 11.5–15.5)
WBC: 8.7 10*3/uL (ref 4.0–10.5)
nRBC: 0 % (ref 0.0–0.2)

## 2019-09-16 LAB — CMP (CANCER CENTER ONLY)
ALT: 30 U/L (ref 0–44)
AST: 20 U/L (ref 15–41)
Albumin: 3.9 g/dL (ref 3.5–5.0)
Alkaline Phosphatase: 84 U/L (ref 38–126)
Anion gap: 8 (ref 5–15)
BUN: 11 mg/dL (ref 8–23)
CO2: 23 mmol/L (ref 22–32)
Calcium: 8.8 mg/dL — ABNORMAL LOW (ref 8.9–10.3)
Chloride: 108 mmol/L (ref 98–111)
Creatinine: 0.6 mg/dL — ABNORMAL LOW (ref 0.61–1.24)
GFR, Est AFR Am: >60 mL/min (ref >60)
GFR, Estimated: >60 mL/min (ref >60)
Glucose, Bld: 121 mg/dL — ABNORMAL HIGH (ref 70–99)
Potassium: 4.2 mmol/L (ref 3.5–5.1)
Sodium: 139 mmol/L (ref 135–145)
Total Bilirubin: 1 mg/dL (ref 0.3–1.2)
Total Protein: 7 g/dL (ref 6.5–8.1)

## 2019-09-16 LAB — LACTATE DEHYDROGENASE: LDH: 217 U/L — ABNORMAL HIGH (ref 98–192)

## 2019-09-16 LAB — TYPE AND SCREEN
ABO/RH(D): O POS
Antibody Screen: NEGATIVE

## 2019-09-16 LAB — VITAMIN B12: Vitamin B-12: 633 pg/mL (ref 180–914)

## 2019-09-16 LAB — ABO/RH: ABO/RH(D): O POS

## 2019-09-16 NOTE — Patient Instructions (Signed)
Thank you for choosing [facility] to provide your oncology and hematology care.   Should you have questions after your visit to the [facility] ([facility]), please contact this office at [phone] between 8:30 AM and 4:30 PM.  Voice mails left after 4:00 PM may not be returned until the following business day.  Calls received after 4:30 PM will be answered by an off-site Nurse Triage Line.    Prescription Refills:  Please have your pharmacy contact us directly for most prescription requests.  Contact the office directly for refills of narcotics (pain medications). Allow 48-72 hours for refills.  Appointments: Please contact the [facility] scheduling department [phone] for questions regarding [facility] appointment scheduling.  Contact the schedulers with any scheduling changes so that your appointment can be rescheduled in a timely manner.   Central Scheduling for [place] [phone] - Call to schedule procedures such as PET scans, CT scans, MRI, Ultrasound, etc.  To afford each patient quality time with our providers, please arrive 30 minutes before your scheduled appointment time.  If you arrive late for your appointment, you may be asked to reschedule.  We strive to give you quality time with our providers, and arriving late affects you and other patients whose appointments are after yours. If you are a no show for multiple scheduled visits, you may be dismissed from the clinic at the providers discretion.     Resources: [facility] Social Workers [phone] for additional information on assistance programs or assistance connecting with community support programs   [place] DSS  336-641-3447: Information regarding food stamps, Medicaid, and utility assistance [place] 336-333-6589   [place] [employer]'s shared-ride transportation service for eligible riders who have a disability that prevents them from riding the fixed route bus.   Medicare Rights Center  800-333-4114 Helps people with Medicare understand their rights and benefits, navigate the Medicare system, and secure the quality healthcare they deserve [employer] 800-227-2345 Assists patients locate various types of support and financial assistance Cancer Care: 1-800-813-HOPE (4673) Provides financial assistance, online support groups, medication/co-pay assistance.   Transportation Assistance for appointments at [facility]: Transportation Coordinator [phone]  Again, thank you for choosing [facility] for your care.       

## 2019-09-16 NOTE — Progress Notes (Signed)
HEMATOLOGY/ONCOLOGY CONSULTATION NOTE  Date of Service: 09/16/2019  Patient Care Team: [facility] as PCP - General (Rheumatology) Collier Bullock, MD as Referring Physician (Hematology and Oncology) [name], DO as Referring Physician (Family Medicine)  CHIEF COMPLAINTS/PURPOSE OF CONSULTATION:  Large Granular Lymphocytic Leukemia  Hem/Onc History  1)- chronic leukocytosis (neutrophilia and absolute lymphocytosis), peripheral blood lymphocytes about 5000, about 1/4 of cells have LGL morphology 2)- macrocytic anemia at least since 09/08/2013 (normal CBC on 11/28/2012), MCV now as high as 115, low retics, marrow M:E ratio 15:1 in 05/2016, erythroid hypoplasia 3)- MGUS serum IgG lambda 0.4 g/dL in 07/2014, follow annually 4)- 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 weekly -03/29/2017 increase dose 25 mg po qweek -07/19/2017 increase mtx 30 mg po qweek, stable dose since then   HISTORY OF PRESENTING ILLNESS:   Tommy Peterson is a wonderful 79 y.o. male who has been referred to [place] by Dr [name] for evaluation and management of Large Granular Lymphocytic Leukemia. The pt reports that he is doing well overall.   Pt is here as he is moving his care because his previous Oncologist, Dr. [name], has changed practices. The pt reports that he needed 2 pints of blood every 2 weeks for a year beginning in 2014. Pt has never needed to any erythropoeitin injections. His Hgb has been  holding higher lately, around 10-11. It has been about 7 months since pt has needed his last blood transfusion. Pt has been taking 30 mg of Methotrexate once per week and denies any issues with that dosage. He has continued taking 1 mg Folic Acid once per day. He is also taking a daily multivitamin.   Pt was previously walking a lot but had to slow down due to fatigue. Pt will need a surgery to repair his left inguinal hernia.   In 2017 pt had a C. Diff infection. Pt had recurrent UTI infections in 2019. His last hospitalization for infections was in January 2020. There were no kidney stones or other reasons for repeat infections. Pt denies any pneumonia infections. Pt has had Diabetes for 40+ years and it is currently well controlled. Pt was never given a direct reason for chronic diarrhea but was placed on Budesonide which stopped his symptoms. Pt is currently taking 3 mg twice per week. He notes that he has been experiencing some urinary frequency, but does feel like he is completely emptying his bladder when he uses the restroom. Pt has seen a Urologist who gave him some medication that caused him to urinate up to 30 times per day. He has tried Flomax previously, and still has some at home, but notes that it does not help much. Pt is following with a Dermatologist , Dr. Rhona Raider, for his skin Squamous Cell Carcinoma. He is currently using Efudex to treat. He was having some night sweats about a year ago, but has had none recently.   Most recent lab results (08/28/2019) of CBC w/diff and CMP is as follows: all values are WNL except for  Hgb at 11.8, HCT at 35.1, MCV at 122, MCH at 41.1, RDW at 2.87, RDW at 16.0, Abs Immature Granulocyte at 0.11K, Immature Granulocyte Rel at 1.2, Mono Abs at 1.0K, Creatinine at 0.5, Glucose at 199.  On review of systems, pt reports constipation, fatigue and denies diarrhea, unexpected weight loss, fevers, chills, night sweats, abdominal pain, leg swelling and any other  symptoms.   On PMHx the pt reports BPH, Chronic Diarrhea, Chronic Large Granular Lymphocytic Leukemia, Collagenous Collitis, Clostridium defficile colitis, Chronic UTIs, Transfusion-dependant anemia, Type 2 diabetes mellitus.  MEDICAL HISTORY:  Past Medical History:  Diagnosis Date  . Acute respiratory failure ([place])   . BPH (benign prostatic hyperplasia)   . Carpal tunnel syndrome on both sides   . Chronic diarrhea    intermittant due to chronic collagenous colitis  . Chronic large granular lymphocytic leukemia ([place]) primary hemotologist-  [name]. 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 Collagenous colitis    chronic--- intermittant between diarrahea and constipation  . ED (erectile dysfunction)   . HCAP (healthcare-associated pneumonia)   . History of adenomatous polyp of colon   . History of Clostridium difficile colitis 05/2016  . History of pneumonia 11/08/2017   CAP -- LLL---  12-19-2017  per pt no residual symptoms  . History of SCC (squamous cell carcinoma) of skin   . History of sepsis    11-20-2017 severe sepsis due to UTI;  10/ 2017sepsis due to c-diff colitis  . Leucocytosis    chronic  . Lower urinary tract symptoms (LUTS)   . Macrocytic anemia    since 02/ 2015  . MGUS (monoclonal gammopathy of unknown significance)    hemotologist-  [name] @ [facility]  . Monoclonal paraproteinemia   . Neuropathy, peripheral   . OA (osteoarthritis)   . Prostatic stone   . Rash of face    RIGHT SIDE  . Raynaud's syndrome   . Septic shock ([facility])    x 3 w/UTI  . Thin skin    fragile  . Transfusion-dependent anemia since 10/ 2017   last transfusion PRBCs 07-19-2017  per hemologist note dated 10-25-2017  . Type 2 diabetes mellitus treated with insulin ([place])     followed by [name] (pcp)  . Urinary hesitancy   . Wears contact lenses    LEFT EYE ONLY    SURGICAL HISTORY: Past Surgical History:  Procedure Laterality Date  . BONE MARROW BIOPSY Right 05-24-2016;  08-20-2014  . CARDIOVASCULAR STRESS TEST  11-26-2012   [name]  @ [place]   normal nuclear study w/ no ischemia/  normal LV function and wall motion , ef 60% (find in care everywhere,epic)  . CATARACT EXTRACTION W/ INTRAOCULAR LENS  IMPLANT, BILATERAL  2015  . CYSTOSCOPY WITH LITHOLAPAXY N/A 12/25/2017   Procedure: CYSTOSCOPY WITH LITHOLAPAXY AND FULGERATION;  Surgeon: [name], MD;  Location[facility];  Service: Urology;  Laterality: N/A;  . INGUINAL HERNIA REPAIR Right 1980s  . LOOP RECORDER INSERTION N/A 04/10/2018   Procedure: LOOP RECORDER INSERTION;  Surgeon: [name], MD;  Location: [facility];  Service: Cardiovascular;  Laterality: N/A;  . PENILE PROSTHESIS IMPLANT  12-02-2015   dr [name] @ [place] in [place]  . SHOULDER  SURGERY Left 08/2016  . TONSILLECTOMY  child  . TRANSURETHRAL RESECTION OF PROSTATE  2009   AND REPAIR RECURRENT RIGHT INGUINAL HERNIA    SOCIAL HISTORY: Social History   Socioeconomic History  . Marital status: Widowed    Spouse name: engaged  . Number of children: 8  . Years of education: college-2  . Highest education level: Not on file  Occupational History  . Occupation: Press photographer  . 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 Smoking status: Former Smoker    Years: 20.00    Types: Cigarettes    Quit date: 05/04/1996    Years since quitting: 23.3  . Smokeless tobacco: Never Used  Substance and Sexual Activity  . Alcohol use: Yes    Comment: 1-2 glasses of wine every other day  . Drug use: No  . Sexual activity: Never  Other Topics Concern  . Not on file  Social History Narrative  . Not on file   Social Determinants of Health   Financial Resource Strain:   .  Difficulty of Paying Living Expenses: Not on file  Food Insecurity:   . Worried About Charity fundraiser in the Last Year: Not on file  . Ran Out of Food in the Last Year: Not on file  Transportation Needs:   . Lack of Transportation (Medical): Not on file  . Lack of Transportation (Non-Medical): Not on file  Physical Activity:   . Days of Exercise per Week: Not on file  . Minutes of Exercise per Session: Not on file  Stress:   . Feeling of Stress : Not on file  Social Connections:   . Frequency of Communication with Friends and Family: Not on file  . Frequency of Social Gatherings with Friends and Family: Not on file  . Attends Religious Services: Not on file  . Active Member of Clubs or Organizations: Not on file  . Attends Archivist Meetings: Not on file  . Marital Status: Not on file  Intimate Partner Violence:   . Fear of Current or Ex-Partner: Not on file  . Emotionally Abused: Not on file  . Physically Abused: Not on file  . Sexually Abused: Not on file    FAMILY HISTORY: Family History  Problem Relation Age of Onset  . Diabetes Mother   . Heart failure Mother     ALLERGIES:  is allergic to bee venom; metformin and related; and sulfamethoxazole-trimethoprim.  MEDICATIONS:  Current Outpatient Medications  Medication Sig Dispense Refill  . acetaminophen (TYLENOL) 325 MG tablet Take 2 tablets (650 mg total) by mouth every 6 (six) hours as needed for mild pain (or Fever >/= 101).    . cetirizine (ZYRTEC) 10 MG tablet Take 10 mg by mouth daily.    . Cranberry 400 MG TABS Take by mouth daily.    Marland Kitchen docusate sodium (COLACE) 100 MG capsule Take 100 mg by mouth daily.    Marland Kitchen gabapentin (NEURONTIN) 100 MG capsule TAKE 1 CAPSULE BY MOUTH THREE TIMES A DAY 270 capsule 2  . Insulin Glargine-Lixisenatide (SOLIQUA) 100-33 UNT-MCG/ML SOPN Inject 28 Units into the skin at bedtime.     . methotrexate (RHEUMATREX) 2.5 MG tablet Take 30 mg by mouth every Tuesday.     Marland Kitchen  NOVOLOG FLEXPEN 100 UNIT/ML FlexPen INJECT 4 UNITS TWICE A DAY BEFORE MEALS    . pioglitazone (ACTOS) 15 MG tablet Take 15 mg by mouth every morning.     Marland Kitchen UNABLE TO FIND Med Name:  D Mannose 500 mg daily    . UNABLE TO FIND Med Name: Annamary Carolin vitamin daily     No current facility-administered medications for this visit.    REVIEW OF SYSTEMS:    10 Point review of Systems was done is negative except as noted above.  PHYSICAL EXAMINATION: ECOG PERFORMANCE STATUS: 1 - Symptomatic but completely ambulatory  . Vitals:   09/16/19 1050  BP: 134/63  Pulse: 65  Resp: 18  Temp: 98 F (36.7 C)  SpO2: 97%   Filed Weights   09/16/19 1050  Weight: 175 lb 9.6 oz (79.7 kg)   .Body mass index is 25.56 kg/m.  GENERAL:alert, in no acute distress and comfortable SKIN: no acute rashes, no significant lesions EYES: conjunctiva are pink and non-injected, sclera anicteric OROPHARYNX: MMM, no exudates, no oropharyngeal erythema or ulceration NECK: supple, no JVD LYMPH:  no palpable lymphadenopathy in the cervical, axillary or inguinal regions LUNGS: clear to auscultation b/l with normal respiratory effort HEART: regular rate & rhythm ABDOMEN:  normoactive bowel sounds , non tender, not distended. Extremity: no pedal edema PSYCH: alert & oriented x 3 with fluent speech NEURO: no focal motor/sensory deficits  LABORATORY DATA:  I have reviewed the data as listed  . CBC Latest Ref Rng & Units 03/13/2019 09/03/2018 09/02/2018  WBC 4.0 - 10.5 K/uL - 11.8(H) -  Hemoglobin 13.0 - 17.0 g/dL - 8.7(L) 8.4(L)  Hematocrit 37.5 - 51.0 % 30.1(L) 26.7(L) 25.9(L)  Platelets 150 - 400 K/uL - 223 -    . CMP Latest Ref Rng & Units 09/03/2018 09/02/2018 09/01/2018  Glucose 70 - 99 mg/dL 163(H) 187(H) 131(H)  BUN 8 - 23 mg/dL '8 9 16  '$ Creatinine 0.61 - 1.24 mg/dL 0.39(L) 0.45(L) 0.48(L)  Sodium 135 - 145 mmol/L 138 138 140  Potassium 3.5 - 5.1 mmol/L 3.3(L) 3.7 3.7  Chloride 98 - 111 mmol/L 108 109 114(H)   CO2 22 - 32 mmol/L '23 23 23  '$ Calcium 8.9 - 10.3 mg/dL 8.1(L) 8.2(L) 8.1(L)  Total Protein 6.5 - 8.1 g/dL - - -  Total Bilirubin 0.3 - 1.2 mg/dL - - -  Alkaline Phos 38 - 126 U/L - - -  AST 15 - 41 U/L - - -  ALT 0 - 44 U/L - - -   09/28/2016 Flow Cytometry:   RADIOGRAPHIC STUDIES: I have personally reviewed the radiological images as listed and agreed with the findings in the report. No results found.  ASSESSMENT & PLAN:   79 yo with   1) T cell large granular leukemia s/p transfusion dependent anemia - currently on MTX '30mg'$  po weekly 2) IgG Lambda MGUS PLAN: -Discussed patient's most recent labs from 08/28/2019, all values are WNL except for Hgb at 11.8, HCT at 35.1, MCV at 122, MCH at 41.1, RDW at 2.87, RDW at 16.0, Abs Immature Granulocyte at 0.11K, Immature Granulocyte Rel at 1.2, Mono Abs at 1.0K, Creatinine at 0.5, Glucose at 199. -No concerning clinical exam findings, no constitutional symptoms -Pt's Large Granular Lymphocytic Leukemia appears stable at this time -hgb 12.2 today -- no indication of PRBC transfusion. -Plan to monitor every 2 months with labs and clinic visit -Will give blood transfusions as needed  -Will watch kidney and liver function while on Methotrexate -Recommend pt continue to take 1 mg Folic Acid -Advised pt to continue 30 mg Methotrexate once per week. Continue folic acid '1mg'$  po daily. -Pt also has MGUS, last M Protein at 1.4 g/dL - will continue to  monitor with labs -Recommend pt continue to f/u with Dr. Rhona Raider -Will get labs today, including MMP -Will see back in 2 months with labs    FOLLOW UP: Labs today RTC with Dr Irene Limbo with labs in 2 months  . Orders Placed This Encounter  Procedures  . CBC with Differential/Platelet    Standing Status:   Future    Number of Occurrences:   1    Standing Expiration Date:   10/20/2020  . CMP ([place] only)    Standing Status:   Future    Number of Occurrences:   1    Standing Expiration  Date:   09/15/2020  . Multiple Myeloma Panel (SPEP&IFE w/QIG)    Standing Status:   Future    Number of Occurrences:   1    Standing Expiration Date:   09/15/2020  . Lactate dehydrogenase    Standing Status:   Future    Number of Occurrences:   1    Standing Expiration Date:   09/15/2020  . Folate RBC    Standing Status:   Future    Number of Occurrences:   1    Standing Expiration Date:   09/15/2020  . Vitamin B12    Standing Status:   Future    Number of Occurrences:   1    Standing Expiration Date:   09/15/2020  . Type and screen    Standing Status:   Future    Number of Occurrences:   1    Standing Expiration Date:   09/15/2020    All of the patients questions were answered with apparent satisfaction. The patient knows to call the clinic with any problems, questions or concerns.  I spent 45 mins counseling the patient face to face. The total time spent in the appointment was 60 minutes and more than 50% was on counseling and direct patient cares.    Sullivan Lone MD [place] AAHIVMS [facility] Hematology/Oncology Physician [facility]  (Office):       [phone] (Work cell):  [phone] (Fax):           [phone]  09/16/2019 7:25 AM  I, Yevette Edwards, am acting as a scribe for Dr. Sullivan Lone.   .I have reviewed the above documentation for accuracy and completeness, and I agree with the above. Brunetta Genera MD

## 2019-09-17 ENCOUNTER — Telehealth: Payer: Self-pay | Admitting: Hematology

## 2019-09-17 LAB — FOLATE RBC
Folate, Hemolysate: 309 ng/mL
Folate, RBC: 893 ng/mL (ref >498)
Hematocrit: 34.6 % — ABNORMAL LOW (ref 37.5–51.0)

## 2019-09-17 LAB — HEMATOLOGY COMMENTS:

## 2019-09-17 NOTE — Telephone Encounter (Signed)
Scheduled per 02/09 los, patient has been called and notified.  °

## 2019-09-18 LAB — MULTIPLE MYELOMA PANEL, SERUM
Albumin SerPl Elph-Mcnc: 3.8 g/dL (ref 2.9–4.4)
Albumin/Glob SerPl: 1.4 (ref 0.7–1.7)
Alpha 1: 0.3 g/dL (ref 0.0–0.4)
Alpha2 Glob SerPl Elph-Mcnc: 0.5 g/dL (ref 0.4–1.0)
B-Globulin SerPl Elph-Mcnc: 0.8 g/dL (ref 0.7–1.3)
Gamma Glob SerPl Elph-Mcnc: 1.2 g/dL (ref 0.4–1.8)
Globulin, Total: 2.8 g/dL (ref 2.2–3.9)
IgA: 274 mg/dL (ref 61–437)
IgG (Immunoglobin G), Serum: 1154 mg/dL (ref 603–1613)
IgM (Immunoglobulin M), Srm: 141 mg/dL (ref 15–143)
Total Protein ELP: 6.6 g/dL (ref 6.0–8.5)

## 2019-10-12 DIAGNOSIS — Z8551 Personal history of malignant neoplasm of bladder: Secondary | ICD-10-CM | POA: Diagnosis not present

## 2019-10-12 DIAGNOSIS — Z794 Long term (current) use of insulin: Secondary | ICD-10-CM | POA: Diagnosis not present

## 2019-10-12 DIAGNOSIS — Z91030 Bee allergy status: Secondary | ICD-10-CM | POA: Diagnosis not present

## 2019-10-12 DIAGNOSIS — Z833 Family history of diabetes mellitus: Secondary | ICD-10-CM | POA: Diagnosis not present

## 2019-10-12 DIAGNOSIS — C9590 Leukemia, unspecified not having achieved remission: Secondary | ICD-10-CM | POA: Diagnosis not present

## 2019-10-12 DIAGNOSIS — R030 Elevated blood-pressure reading, without diagnosis of hypertension: Secondary | ICD-10-CM | POA: Diagnosis not present

## 2019-10-12 DIAGNOSIS — Z87891 Personal history of nicotine dependence: Secondary | ICD-10-CM | POA: Diagnosis not present

## 2019-10-12 DIAGNOSIS — E119 Type 2 diabetes mellitus without complications: Secondary | ICD-10-CM | POA: Diagnosis not present

## 2019-10-12 DIAGNOSIS — Z87440 Personal history of urinary (tract) infections: Secondary | ICD-10-CM | POA: Diagnosis not present

## 2019-10-12 DIAGNOSIS — N529 Male erectile dysfunction, unspecified: Secondary | ICD-10-CM | POA: Diagnosis not present

## 2019-10-20 ENCOUNTER — Ambulatory Visit: Payer: Medicare HMO | Attending: Internal Medicine

## 2019-10-20 DIAGNOSIS — Z20822 Contact with and (suspected) exposure to covid-19: Secondary | ICD-10-CM

## 2019-10-21 LAB — NOVEL CORONAVIRUS, NAA: SARS-CoV-2, NAA: NOT DETECTED

## 2019-10-22 ENCOUNTER — Ambulatory Visit: Payer: Medicare HMO | Admitting: Dermatology

## 2019-11-10 DIAGNOSIS — E1140 Type 2 diabetes mellitus with diabetic neuropathy, unspecified: Secondary | ICD-10-CM | POA: Diagnosis not present

## 2019-11-13 ENCOUNTER — Other Ambulatory Visit: Payer: Self-pay | Admitting: *Deleted

## 2019-11-13 DIAGNOSIS — C91Z0 Other lymphoid leukemia not having achieved remission: Secondary | ICD-10-CM

## 2019-11-13 DIAGNOSIS — D649 Anemia, unspecified: Secondary | ICD-10-CM

## 2019-11-13 NOTE — Progress Notes (Signed)
HEMATOLOGY/ONCOLOGY CLINIC NOTE  Date of Service: 11/14/2019  Patient Care Team: [facility] [place] as PCP - General (Rheumatology) Collier Bullock, MD as Referring Physician (Hematology and Oncology) [name], DO as Referring Physician (Family Medicine)  CHIEF COMPLAINTS/PURPOSE OF CONSULTATION:  Large Granular Lymphocytic Leukemia  Hem/Onc History  1)- chronic leukocytosis (neutrophilia and absolute lymphocytosis), peripheral blood lymphocytes about 5000, about 1/4 of cells have LGL morphology 2)- macrocytic anemia at least since 09/08/2013 (normal CBC on 11/28/2012), MCV now as high as 115, low retics, marrow M:E ratio 15:1 in 05/2016, erythroid hypoplasia 3)- MGUS serum IgG lambda 0.4 g/dL in 07/2014, follow annually 4)- transfusion dependent anemia since October 2017 total 5 units prbc in Oct/Nov 2017, resolved on weekly low dose mtx Mr Quast has T-cell LGL dx 09/2016, 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 weekly -03/29/2017 increase dose 25 mg po qweek -07/19/2017 increase mtx 30 mg po qweek, stable dose since then   HISTORY OF PRESENTING ILLNESS:   Tommy Peterson is a wonderful 79 y.o. male who has been referred to [place] by Dr [name] for evaluation and management of Large Granular Lymphocytic Leukemia. The patient's last visit with [place] was on 09/16/19. The pt reports that he is doing well overall.  The pt reports he is fine. He has been very fatigued. Pt woke up about 5 days ago with purple nails, on every finger. He called PCP and they suggested his oxygen levels are low.  He is walking less, about half a mile a day, when  before he was walking more than a mile multiple times a day. Pt has attributed this to fatigue. He has a history of raynaud's syndrome. Pt has had no new pain or changes in sensation lately. He does have neuropathy in both hands and tingling and numbness most of the time. Pt has not gotten the COVID19 vaccines.   Lab results today (11/14/19) of CBC w/diff and CMP is as follows: all values are WNL except for RBC at 2.96, Hemoglobin at 12, HCT at 36.6, MCV at 123.6, MCH at 40.5, RDW at 16.1, Lymph's Abs at 4.5K, Monocytes Abs at 1.1K, Glucose at 167 11/14/19 of LDH at 175  On review of systems, pt reports fatigue, acrocyanosis in all 10 finger nails, healthy appetite, no unexpected weight loss and denies SOB, fever, chills, chest pain, neck pain or swelling, skin rashes, back pain, abdominal pain and any other symptoms.   MEDICAL HISTORY:  Past Medical History:  Diagnosis Date  . Acute respiratory failure ([place])   . BCC (basal cell carcinoma of skin) 08/15/2016   w/SCC Left Forehead (treatment @ [facility])  . BPH (benign prostatic hyperplasia)   . Carpal tunnel syndrome on both sides   . Chronic diarrhea    intermittant due to chronic collagenous colitis  . Chronic large granular lymphocytic leukemia ([place]) primary hemotologist-  [name]. 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 Collagenous colitis    chronic--- intermittant between diarrahea  and constipation  . ED (erectile dysfunction)   . HCAP (healthcare-associated pneumonia)   . History of adenomatous polyp of colon   . History of Clostridium difficile colitis 05/2016  . History of pneumonia 11/08/2017   CAP -- LLL---  12-19-2017  per pt no residual symptoms  . History of SCC (squamous cell carcinoma) of skin   .  History of sepsis    11-20-2017 severe sepsis due to UTI;  10/ 2017sepsis due to c-diff colitis  . Leucocytosis    chronic  . Lower urinary tract symptoms (LUTS)   . Macrocytic anemia    since 02/ 2015  . MGUS (monoclonal gammopathy of unknown significance)    hemotologist-  [name] @ [facility]  . Monoclonal paraproteinemia   . Neuropathy, peripheral   . Nodular basal cell carcinoma (BCC) 10/01/2018   Left Neck(Nodular) Pt Does Not Want Treatment  . Nodular basal cell carcinoma (BCC) 10/01/2018   Mid Forehead (treament curet and excision)  . OA (osteoarthritis)   . Prostatic stone   . Rash of face    RIGHT SIDE  . Raynaud's syndrome   . Recurrent BCC (basal cell carcinoma) 03/05/2019   positive margin  . SCC (squamous cell carcinoma) 08/10/2009   Central Forehead (Moh's Dr. Rhona Raider @ [facility])  . SCC (squamous cell carcinoma) 12/08/2011   CIS-Left Cheek (treatment Aldara @ [facility])  . SCC (squamous cell carcinoma) 07/09/2015   CIS-Left Forehead (treament @ [facility])  . Septic shock (HCC)    x 3 w/UTI  . Squamous cell carcinoma in situ (SCCIS) 12/08/2011   Left Cheek (treatment Aldara @ [facility])   . Superficial basal cell carcinoma (BCC) 10/01/2018   Left Shin-Pt does not want treatment  . Thin skin    fragile  . Transfusion-dependent anemia since 10/ 2017   last transfusion PRBCs 07-19-2017  per hemologist note dated 10-25-2017  . Type 2 diabetes mellitus treated with insulin ([place])    followed by [name] (pcp)  . Urinary hesitancy   . Wears contact lenses    LEFT EYE ONLY    SURGICAL HISTORY: Past Surgical History:  Procedure Laterality Date  . BONE MARROW BIOPSY Right 05-24-2016;  08-20-2014  . CARDIOVASCULAR STRESS TEST  11-26-2012   [name]  @ [place]   normal nuclear study w/ no ischemia/  normal LV function and wall motion , ef 60% (find in care everywhere,epic)  . CATARACT EXTRACTION W/ INTRAOCULAR LENS  IMPLANT, BILATERAL   2015  . CYSTOSCOPY WITH LITHOLAPAXY N/A 12/25/2017   Procedure: CYSTOSCOPY WITH LITHOLAPAXY AND FULGERATION;  Surgeon: [name], MD;  Location: [facility];  Service: Urology;  Laterality: N/A;  . INGUINAL HERNIA REPAIR Right 1980s  . LOOP RECORDER INSERTION N/A 04/10/2018   Procedure: LOOP RECORDER INSERTION;  Surgeon: [name], MD;  Location: [facility];  Service: Cardiovascular;  Laterality: N/A;  . PENILE PROSTHESIS IMPLANT  12-02-2015   dr [name] @ [facility] in [place]  . SHOULDER SURGERY Left 08/2016  . TONSILLECTOMY  child  . TRANSURETHRAL RESECTION OF PROSTATE  2009   AND REPAIR RECURRENT RIGHT INGUINAL HERNIA    SOCIAL HISTORY: Social History   Socioeconomic History  . Marital status: Widowed    Spouse name: engaged  . Number of children: 8  . Years of education: college-2  . Highest education level: Not on file  Occupational History  . Occupation: Press photographer  .  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 Smoking status: Former Smoker    Years: 20.00    Types: Cigarettes    Quit date: 05/04/1996    Years since quitting: 23.5  . Smokeless tobacco: Never Used  Substance and Sexual Activity  . Alcohol use: Yes    Comment: 1-2 glasses of wine every other day  . Drug use: No  . Sexual activity: Never  Other Topics Concern  . Not on file  Social History Narrative  . Not on file   Social Determinants of Health   Financial Resource Strain:   . Difficulty of Paying Living Expenses:   Food Insecurity:   . Worried About Charity fundraiser in the Last Year:   . Arboriculturist in the Last Year:   Transportation Needs:   . Film/video editor (Medical):   Marland Kitchen Lack of Transportation (Non-Medical):   Physical Activity:   . Days of Exercise per Week:   . Minutes of Exercise per Session:   Stress:   . Feeling of Stress :   Social Connections:   . Frequency of Communication with Friends and Family:   . Frequency of  Social Gatherings with Friends and Family:   . Attends Religious Services:   . Active Member of Clubs or Organizations:   . Attends Archivist Meetings:   Marland Kitchen Marital Status:   Intimate Partner Violence:   . Fear of Current or Ex-Partner:   . Emotionally Abused:   Marland Kitchen Physically Abused:   . Sexually Abused:     FAMILY HISTORY: Family History  Problem Relation Age of Onset  . Diabetes Mother   . Heart failure Mother     ALLERGIES:  is allergic to bee venom; metformin and related; and sulfamethoxazole-trimethoprim.  MEDICATIONS:  Current Outpatient Medications  Medication Sig Dispense Refill  . acetaminophen (TYLENOL) 325 MG tablet Take 2 tablets (650 mg total) by mouth every 6 (six) hours as needed for mild pain (or Fever >/= 101).    . cetirizine (ZYRTEC) 10 MG tablet Take 10 mg by mouth daily.    . Cranberry 400 MG TABS Take by mouth daily.    . D-Mannose 500 MG CAPS Take 500 mg by mouth daily.    Marland Kitchen docusate sodium (COLACE) 100 MG capsule Take 100 mg by mouth daily.    . Insulin Glargine-Lixisenatide (SOLIQUA) 100-33 UNT-MCG/ML SOPN Inject 28 Units into the skin at bedtime.     . methotrexate (RHEUMATREX) 2.5 MG tablet Take 30 mg by mouth every Tuesday.     Marland Kitchen NOVOLOG FLEXPEN 100 UNIT/ML FlexPen INJECT 4 UNITS TWICE A DAY BEFORE MEALS    . pioglitazone (ACTOS) 15 MG tablet Take 15 mg by mouth every morning.     . gabapentin (NEURONTIN) 100 MG capsule TAKE 1 CAPSULE BY MOUTH THREE TIMES A DAY (Patient not taking: Reported on 09/16/2019) 270 capsule 2   No current facility-administered medications for this visit.    REVIEW OF SYSTEMS:   A 10+ POINT REVIEW OF SYSTEMS WAS OBTAINED including neurology, dermatology, psychiatry, cardiac, respiratory, lymph, extremities, GI, GU, Musculoskeletal, constitutional, breasts, reproductive, HEENT.  All pertinent positives are noted in the HPI.  All others are negative.   PHYSICAL EXAMINATION: ECOG PERFORMANCE STATUS: 1 - Symptomatic  but completely ambulatory  . Vitals:   11/14/19 1026  BP: (!) 159/56  Pulse: 73  Resp: 18  Temp: 98.7 F (37.1 C)  SpO2: 97%   Filed Weights  11/14/19 1026  Weight: 173 lb 6.4 oz (78.7 kg)   .Body mass index is 25.24 kg/m.  GENERAL:alert, in no acute distress and comfortable SKIN: no acute rashes, no significant lesions EYES: conjunctiva are pink and non-injected, sclera anicteric OROPHARYNX: MMM, no exudates, no oropharyngeal erythema or ulceration NECK: supple, no JVD LYMPH:  no palpable lymphadenopathy in the cervical, axillary or inguinal regions LUNGS: clear to auscultation b/l with normal respiratory effort HEART: regular rate & rhythm ABDOMEN:  normoactive bowel sounds , non tender, not distended. Extremity: no pedal edema PSYCH: alert & oriented x 3 with fluent speech NEURO: no focal motor/sensory deficits  LABORATORY DATA:  I have reviewed the data as listed  . CBC Latest Ref Rng & Units 11/14/2019 09/16/2019 09/16/2019  WBC 4.0 - 10.5 K/uL 8.8 8.7 -  Hemoglobin 13.0 - 17.0 g/dL 12.0(L) 12.2(L) -  Hematocrit 39.0 - 52.0 % 36.6(L) 35.7(L) 34.6(L)  Platelets 150 - 400 K/uL 281 284 -    . CMP Latest Ref Rng & Units 11/14/2019 09/16/2019 09/03/2018  Glucose 70 - 99 mg/dL 167(H) 121(H) 163(H)  BUN 8 - 23 mg/dL '9 11 8  '$ Creatinine 0.61 - 1.24 mg/dL 0.67 0.60(L) 0.39(L)  Sodium 135 - 145 mmol/L 138 139 138  Potassium 3.5 - 5.1 mmol/L 4.1 4.2 3.3(L)  Chloride 98 - 111 mmol/L 106 108 108  CO2 22 - 32 mmol/L '27 23 23  '$ Calcium 8.9 - 10.3 mg/dL 9.0 8.8(L) 8.1(L)  Total Protein 6.5 - 8.1 g/dL 6.9 7.0 -  Total Bilirubin 0.3 - 1.2 mg/dL 1.0 1.0 -  Alkaline Phos 38 - 126 U/L 103 84 -  AST 15 - 41 U/L 22 20 -  ALT 0 - 44 U/L 29 30 -   09/28/2016 Flow Cytometry:   RADIOGRAPHIC STUDIES: I have personally reviewed the radiological images as listed and agreed with the findings in the report. No results found.  ASSESSMENT & PLAN:   79 yo with   1) T cell large granular  leukemia s/p transfusion dependent anemia - currently on MTX '30mg'$  po weekly 2) IgG Lambda MGUS  PLAN: -Discussed pt labwork today, 11/14/19;  of CBC w/diff and CMP is as follows: all values are WNL except for RBC at 2.96, Hemoglobin at 12, HCT at 36.6, MCV at 123.6, MCH at 40.5, RDW at 16.1, Lymph's Abs at 4.5K, Monocytes Abs at 1.1K, Glucose at 167 -Discussed 11/14/19 of LDH at 175 -No concerning clinical exam findings, no constitutional symptoms -Pt's Large Granular Lymphocytic Leukemia appears stable at this time -hgb 12 today - no indication of PRBC transfusion. -Will give blood transfusions as needed  -Will watch kidney and liver function while on Methotrexate -Recommend pt continue to take 1 mg Folic Acid -Advised on nail's being pigmented- try nail polish remover -Advised pt to continue 30 mg Methotrexate once per week. -Advised MGUS appears resolved -Advised on acrocyanosis- Recommends keeping hands warm -Advised on methotrexate medication- recommends continuing medication -Continue watching if stable before cutting down dose of methotrexate gradually  -Recommends getting COVID19 vaccine -Recommends continue seeing dermatoloigst  -Recommends taking Aspirin  -F/u with PCP  -Will see back in 2 months   FOLLOW UP: RTC with Dr Irene Limbo with labs in 2 months  . Orders Placed This Encounter  Procedures  . CBC with Differential/Platelet    Standing Status:   Future    Standing Expiration Date:   12/18/2020  . CMP ([place] only)    Standing Status:   Future  Standing Expiration Date:   11/13/2020  . Sample to Blood Bank    Standing Status:   Future    Standing Expiration Date:   11/13/2020    The total time spent in the appt was 30 minutes and more than 50% was on counseling and direct patient cares.  All of the patient's questions were answered with apparent satisfaction. The patient knows to call the clinic with any problems, questions or concerns.  Sullivan Lone MD MS  AAHIVMS [facility] Hematology/Oncology Physician [facility]  (Office):       [phone] (Work cell):  [phone] (Fax):           [phone]  11/14/2019 12:54 PM  I, Dawayne Cirri am acting as a Education administrator for Dr. Sullivan Lone.   .I have reviewed the above documentation for accuracy and completeness, and I agree with the above. Brunetta Genera MD

## 2019-11-14 ENCOUNTER — Telehealth: Payer: Self-pay | Admitting: Hematology

## 2019-11-14 ENCOUNTER — Other Ambulatory Visit: Payer: Self-pay

## 2019-11-14 ENCOUNTER — Inpatient Hospital Stay: Payer: Medicare HMO | Attending: Hematology

## 2019-11-14 ENCOUNTER — Inpatient Hospital Stay: Payer: Medicare HMO | Admitting: Hematology

## 2019-11-14 VITALS — BP 159/56 | HR 73 | Temp 98.7°F | Resp 18 | Ht 69.5 in | Wt 173.4 lb

## 2019-11-14 DIAGNOSIS — N400 Enlarged prostate without lower urinary tract symptoms: Secondary | ICD-10-CM | POA: Diagnosis not present

## 2019-11-14 DIAGNOSIS — D472 Monoclonal gammopathy: Secondary | ICD-10-CM

## 2019-11-14 DIAGNOSIS — Z794 Long term (current) use of insulin: Secondary | ICD-10-CM | POA: Insufficient documentation

## 2019-11-14 DIAGNOSIS — Z8249 Family history of ischemic heart disease and other diseases of the circulatory system: Secondary | ICD-10-CM | POA: Diagnosis not present

## 2019-11-14 DIAGNOSIS — Z833 Family history of diabetes mellitus: Secondary | ICD-10-CM | POA: Diagnosis not present

## 2019-11-14 DIAGNOSIS — C91Z0 Other lymphoid leukemia not having achieved remission: Secondary | ICD-10-CM

## 2019-11-14 DIAGNOSIS — Z87891 Personal history of nicotine dependence: Secondary | ICD-10-CM | POA: Insufficient documentation

## 2019-11-14 DIAGNOSIS — Z79899 Other long term (current) drug therapy: Secondary | ICD-10-CM | POA: Diagnosis not present

## 2019-11-14 DIAGNOSIS — D649 Anemia, unspecified: Secondary | ICD-10-CM

## 2019-11-14 LAB — CMP (CANCER CENTER ONLY)
ALT: 29 U/L (ref 0–44)
AST: 22 U/L (ref 15–41)
Albumin: 3.7 g/dL (ref 3.5–5.0)
Alkaline Phosphatase: 103 U/L (ref 38–126)
Anion gap: 5 (ref 5–15)
BUN: 9 mg/dL (ref 8–23)
CO2: 27 mmol/L (ref 22–32)
Calcium: 9 mg/dL (ref 8.9–10.3)
Chloride: 106 mmol/L (ref 98–111)
Creatinine: 0.67 mg/dL (ref 0.61–1.24)
GFR, Est AFR Am: >60 mL/min (ref >60)
GFR, Estimated: >60 mL/min (ref >60)
Glucose, Bld: 167 mg/dL — ABNORMAL HIGH (ref 70–99)
Potassium: 4.1 mmol/L (ref 3.5–5.1)
Sodium: 138 mmol/L (ref 135–145)
Total Bilirubin: 1 mg/dL (ref 0.3–1.2)
Total Protein: 6.9 g/dL (ref 6.5–8.1)

## 2019-11-14 LAB — CBC WITH DIFFERENTIAL (CANCER CENTER ONLY)
Abs Immature Granulocytes: 0.07 10*3/uL (ref 0.00–0.07)
Basophils Absolute: 0 10*3/uL (ref 0.0–0.1)
Basophils Relative: 0 %
Eosinophils Absolute: 0 10*3/uL (ref 0.0–0.5)
Eosinophils Relative: 0 %
HCT: 36.6 % — ABNORMAL LOW (ref 39.0–52.0)
Hemoglobin: 12 g/dL — ABNORMAL LOW (ref 13.0–17.0)
Immature Granulocytes: 1 %
Lymphocytes Relative: 51 %
Lymphs Abs: 4.5 10*3/uL — ABNORMAL HIGH (ref 0.7–4.0)
MCH: 40.5 pg — ABNORMAL HIGH (ref 26.0–34.0)
MCHC: 32.8 g/dL (ref 30.0–36.0)
MCV: 123.6 fL — ABNORMAL HIGH (ref 80.0–100.0)
Monocytes Absolute: 1.1 10*3/uL — ABNORMAL HIGH (ref 0.1–1.0)
Monocytes Relative: 13 %
Neutro Abs: 3.1 10*3/uL (ref 1.7–7.7)
Neutrophils Relative %: 35 %
Platelet Count: 281 10*3/uL (ref 150–400)
RBC: 2.96 MIL/uL — ABNORMAL LOW (ref 4.22–5.81)
RDW: 16.1 % — ABNORMAL HIGH (ref 11.5–15.5)
WBC Count: 8.8 10*3/uL (ref 4.0–10.5)
nRBC: 0 % (ref 0.0–0.2)

## 2019-11-14 LAB — SAMPLE TO BLOOD BANK

## 2019-11-14 LAB — LACTATE DEHYDROGENASE: LDH: 175 U/L (ref 98–192)

## 2019-11-14 NOTE — Telephone Encounter (Signed)
Scheduled appts per 4/9 los. Pt declined print out of AVS.

## 2019-11-17 DIAGNOSIS — Z7189 Other specified counseling: Secondary | ICD-10-CM | POA: Diagnosis not present

## 2019-11-17 DIAGNOSIS — D472 Monoclonal gammopathy: Secondary | ICD-10-CM | POA: Diagnosis not present

## 2019-11-17 DIAGNOSIS — Z87440 Personal history of urinary (tract) infections: Secondary | ICD-10-CM | POA: Diagnosis not present

## 2019-11-17 DIAGNOSIS — Z6825 Body mass index (BMI) 25.0-25.9, adult: Secondary | ICD-10-CM | POA: Diagnosis not present

## 2019-11-17 DIAGNOSIS — K529 Noninfective gastroenteritis and colitis, unspecified: Secondary | ICD-10-CM | POA: Diagnosis not present

## 2019-11-17 DIAGNOSIS — E1140 Type 2 diabetes mellitus with diabetic neuropathy, unspecified: Secondary | ICD-10-CM | POA: Diagnosis not present

## 2019-11-17 DIAGNOSIS — C91Z0 Other lymphoid leukemia not having achieved remission: Secondary | ICD-10-CM | POA: Diagnosis not present

## 2019-11-17 DIAGNOSIS — Z794 Long term (current) use of insulin: Secondary | ICD-10-CM | POA: Diagnosis not present

## 2019-11-19 DIAGNOSIS — L814 Other melanin hyperpigmentation: Secondary | ICD-10-CM | POA: Diagnosis not present

## 2019-11-19 DIAGNOSIS — L570 Actinic keratosis: Secondary | ICD-10-CM | POA: Diagnosis not present

## 2019-11-19 DIAGNOSIS — Z85828 Personal history of other malignant neoplasm of skin: Secondary | ICD-10-CM | POA: Diagnosis not present

## 2019-11-19 DIAGNOSIS — L821 Other seborrheic keratosis: Secondary | ICD-10-CM | POA: Diagnosis not present

## 2019-12-02 DIAGNOSIS — M65331 Trigger finger, right middle finger: Secondary | ICD-10-CM | POA: Diagnosis not present

## 2019-12-05 ENCOUNTER — Ambulatory Visit (INDEPENDENT_AMBULATORY_CARE_PROVIDER_SITE_OTHER): Payer: Medicare HMO | Admitting: *Deleted

## 2019-12-05 DIAGNOSIS — I480 Paroxysmal atrial fibrillation: Secondary | ICD-10-CM | POA: Diagnosis not present

## 2019-12-05 LAB — CUP PACEART REMOTE DEVICE CHECK
Date Time Interrogation Session: 20210430013150
Date Time Interrogation Session: 20210430013150
Implantable Pulse Generator Implant Date: 20190904
Implantable Pulse Generator Implant Date: 20190904

## 2019-12-05 NOTE — Progress Notes (Signed)
ILR Remote 

## 2019-12-18 ENCOUNTER — Telehealth: Payer: Self-pay

## 2019-12-18 NOTE — Addendum Note (Signed)
Addended by: Douglass Rivers D on: 12/18/2019 04:11 PM   Modules accepted: Level of Service

## 2019-12-18 NOTE — Telephone Encounter (Signed)
The pt called upset because he was billed for a remote and his monitor was not plugged in. I let the pt know that I did delete the charges on my end. He also states he and Dr. Sallyanne Kuster made an arraignment for him to send twice a year. He states he no longer wants to be followed with the loop recorder. I told him I will let Dr. Sallyanne Kuster know and I will take him out of [facility] that way he will not be billed anymore.

## 2020-01-02 DIAGNOSIS — M65331 Trigger finger, right middle finger: Secondary | ICD-10-CM | POA: Diagnosis not present

## 2020-01-14 ENCOUNTER — Inpatient Hospital Stay: Payer: Medicare HMO | Attending: Hematology | Admitting: Hematology

## 2020-01-14 ENCOUNTER — Other Ambulatory Visit: Payer: Self-pay

## 2020-01-14 ENCOUNTER — Inpatient Hospital Stay: Payer: Medicare HMO

## 2020-01-14 VITALS — BP 127/69 | HR 80 | Temp 97.9°F | Resp 18 | Ht 69.5 in | Wt 175.5 lb

## 2020-01-14 DIAGNOSIS — K449 Diaphragmatic hernia without obstruction or gangrene: Secondary | ICD-10-CM | POA: Insufficient documentation

## 2020-01-14 DIAGNOSIS — K529 Noninfective gastroenteritis and colitis, unspecified: Secondary | ICD-10-CM | POA: Insufficient documentation

## 2020-01-14 DIAGNOSIS — D472 Monoclonal gammopathy: Secondary | ICD-10-CM | POA: Insufficient documentation

## 2020-01-14 DIAGNOSIS — Z87440 Personal history of urinary (tract) infections: Secondary | ICD-10-CM | POA: Insufficient documentation

## 2020-01-14 DIAGNOSIS — D649 Anemia, unspecified: Secondary | ICD-10-CM | POA: Insufficient documentation

## 2020-01-14 DIAGNOSIS — Z85828 Personal history of other malignant neoplasm of skin: Secondary | ICD-10-CM | POA: Diagnosis not present

## 2020-01-14 DIAGNOSIS — C91Z0 Other lymphoid leukemia not having achieved remission: Secondary | ICD-10-CM | POA: Diagnosis not present

## 2020-01-14 DIAGNOSIS — R5383 Other fatigue: Secondary | ICD-10-CM | POA: Insufficient documentation

## 2020-01-14 DIAGNOSIS — G629 Polyneuropathy, unspecified: Secondary | ICD-10-CM | POA: Insufficient documentation

## 2020-01-14 DIAGNOSIS — N400 Enlarged prostate without lower urinary tract symptoms: Secondary | ICD-10-CM | POA: Insufficient documentation

## 2020-01-14 DIAGNOSIS — E119 Type 2 diabetes mellitus without complications: Secondary | ICD-10-CM | POA: Insufficient documentation

## 2020-01-14 LAB — CBC WITH DIFFERENTIAL/PLATELET
Abs Immature Granulocytes: 0.07 10*3/uL (ref 0.00–0.07)
Basophils Absolute: 0 10*3/uL (ref 0.0–0.1)
Basophils Relative: 0 %
Eosinophils Absolute: 0 10*3/uL (ref 0.0–0.5)
Eosinophils Relative: 0 %
HCT: 35.8 % — ABNORMAL LOW (ref 39.0–52.0)
Hemoglobin: 11.8 g/dL — ABNORMAL LOW (ref 13.0–17.0)
Immature Granulocytes: 1 %
Lymphocytes Relative: 65 %
Lymphs Abs: 7.7 10*3/uL — ABNORMAL HIGH (ref 0.7–4.0)
MCH: 40 pg — ABNORMAL HIGH (ref 26.0–34.0)
MCHC: 33 g/dL (ref 30.0–36.0)
MCV: 121.4 fL — ABNORMAL HIGH (ref 80.0–100.0)
Monocytes Absolute: 1.3 10*3/uL — ABNORMAL HIGH (ref 0.1–1.0)
Monocytes Relative: 11 %
Neutro Abs: 2.7 10*3/uL (ref 1.7–7.7)
Neutrophils Relative %: 23 %
Platelets: 249 10*3/uL (ref 150–400)
RBC: 2.95 MIL/uL — ABNORMAL LOW (ref 4.22–5.81)
RDW: 16.1 % — ABNORMAL HIGH (ref 11.5–15.5)
WBC: 11.9 10*3/uL — ABNORMAL HIGH (ref 4.0–10.5)
nRBC: 0 % (ref 0.0–0.2)

## 2020-01-14 LAB — CMP (CANCER CENTER ONLY)
ALT: 32 U/L (ref 0–44)
AST: 18 U/L (ref 15–41)
Albumin: 3.5 g/dL (ref 3.5–5.0)
Alkaline Phosphatase: 82 U/L (ref 38–126)
Anion gap: 9 (ref 5–15)
BUN: 13 mg/dL (ref 8–23)
CO2: 24 mmol/L (ref 22–32)
Calcium: 8.7 mg/dL — ABNORMAL LOW (ref 8.9–10.3)
Chloride: 108 mmol/L (ref 98–111)
Creatinine: 0.65 mg/dL (ref 0.61–1.24)
GFR, Est AFR Am: >60 mL/min (ref >60)
GFR, Estimated: >60 mL/min (ref >60)
Glucose, Bld: 111 mg/dL — ABNORMAL HIGH (ref 70–99)
Potassium: 3.9 mmol/L (ref 3.5–5.1)
Sodium: 141 mmol/L (ref 135–145)
Total Bilirubin: 1 mg/dL (ref 0.3–1.2)
Total Protein: 6.6 g/dL (ref 6.5–8.1)

## 2020-01-14 LAB — SAMPLE TO BLOOD BANK

## 2020-01-14 NOTE — Progress Notes (Signed)
HEMATOLOGY/ONCOLOGY CLINIC NOTE  Date of Service: 01/14/2020  Patient Care Team: [facility] [place] as PCP - General (Rheumatology) Collier Bullock, MD as Referring Physician (Hematology and Oncology) Tommy Morning, DO as Referring Physician (Family Medicine)  CHIEF COMPLAINTS/PURPOSE OF CONSULTATION:  Large Granular Lymphocytic Leukemia  Hem/Onc History  1)- chronic leukocytosis (neutrophilia and absolute lymphocytosis), peripheral blood lymphocytes about 5000, about 1/4 of cells have LGL morphology 2)- macrocytic anemia at least since 09/08/2013 (normal CBC on 11/28/2012), MCV now as high as 115, low retics, marrow M:E ratio 15:1 in 05/2016, erythroid hypoplasia 3)- MGUS serum IgG lambda 0.4 g/dL in 07/2014, follow annually 4)- 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 weekly -03/29/2017 increase dose 25 mg po qweek -07/19/2017 increase mtx 30 mg po qweek, stable dose since then   HISTORY OF PRESENTING ILLNESS:  Tommy Peterson is a wonderful 79 y.o. male who has been referred to [place] by Dr [name] for evaluation and management of Large Granular Lymphocytic Leukemia. The pt reports that he is doing well overall.  Pt is here as he is moving his care because his previous Oncologist, Dr. [name], has changed practices. The pt reports that he needed 2 pints of blood every 2 weeks for a year beginning in 2014. Pt has never needed to any erythropoeitin injections. His Hgb has been holding higher  lately, around 10-11. It has been about 7 months since pt has needed his last blood transfusion. Pt has been taking 30 mg of Methotrexate once per week and denies any issues with that dosage. He has continued taking 1 mg Folic Acid once per day. He is also taking a daily multivitamin.   Pt was previously walking a lot but had to slow down due to fatigue. Pt will need a surgery to repair his left inguinal hernia.   In 2017 pt had a C. Diff infection. Pt had recurrent UTI infections in 2019. His last hospitalization for infections was in January 2020. There were no kidney stones or other reasons for repeat infections. Pt denies any pneumonia infections. Pt has had Diabetes for 40+ years and it is currently well controlled. Pt was never given a direct reason for chronic diarrhea but was placed on Budesonide which stopped his symptoms. Pt is currently taking 3 mg twice per week. He notes that he has been experiencing some urinary frequency, but does feel like he is completely emptying his bladder when he uses the restroom. Pt has seen a Urologist who gave him some medication that caused him to urinate up to 30 times per day. He has tried Flomax previously, and still has some at home, but notes that it does not help much. Pt is following with a Dermatologist , Dr. Rhona Raider, for his skin Squamous Cell Carcinoma. He is currently using Efudex to treat. He was having some night sweats about a year ago, but has had none recently.   Most recent lab results (08/28/2019) of CBC w/diff and CMP is as follows: all values are WNL except for Hgb at  11.8, HCT at 35.1, MCV at 122, MCH at 41.1, RDW at 2.87, RDW at 16.0, Abs Immature Granulocyte at 0.11K, Immature Granulocyte Rel at 1.2, Mono Abs at 1.0K, Creatinine at 0.5, Glucose at 199.  On review of systems, pt reports constipation, fatigue and denies diarrhea, unexpected weight loss, fevers, chills, night sweats, abdominal pain, leg swelling and any other symptoms.    On PMHx the pt reports BPH, Chronic Diarrhea, Chronic Large Granular Lymphocytic Leukemia, Collagenous Collitis, Clostridium defficile colitis, Chronic UTIs, Transfusion-dependant anemia, Type 2 diabetes mellitus.  INTERVAL HISTORY:   Tommy Peterson is a wonderful 79 y.o. male who is here for evaluation and management of Large Granular Lymphocytic Leukemia. The patient's last visit with [place] was on 11/14/2019. The pt reports that he is doing well overall.  The pt reports that he has been experiencing weakness for 7-10 days. Pt also has abdominal pain after eating, which is not improved after a bowel movement. He is having very liquid stools up to 8 times per day. He previously suffered from diarrhea for 15 years until he was placed on Entocort by his GI in [place]. Pt has never been diagnosed with any GI dysfunction, but was being treated with Entocort empirically for years.   Lab results today (01/14/20) of CBC w/diff and CMP is as follows: all values are WNL except for WBC at 11.9K, RBC at 2.95, Hgb at 11.8, HCT at 35.8, MCV at 121.4, MCH at 40.0, RDW at 16.1, Lymphs Abs at 7.7K, Mono Abs at 1.3K, Glucose at 111, Calcium at 8.7.  On review of systems, pt reports fatigue, weakness, abdominal pain, diarrhea and denies fevers, chills, night sweats, bloody/black stools and any other symptoms.   MEDICAL HISTORY:  Past Medical History:  Diagnosis Date  . Acute respiratory failure ([place])   . BCC (basal cell carcinoma of skin) 08/15/2016   w/SCC Left Forehead (treatment @ [facility])  . BPH (benign prostatic hyperplasia)   . Carpal tunnel syndrome on both sides   . Chronic diarrhea    intermittant due to chronic collagenous colitis  . Chronic large granular lymphocytic leukemia ([place]) primary hemotologist-  [name]. [name] @ [facility](noted in epic)/  local hemoloigst-  [name] ([facility])   dx 09-28-2016 Chronic large granular lymphocytic leukemia w/ red cell aplasia and transfuion  dependant anemia--  treatment weekly methotraxate and transfusion's (last PRBCs 07-19-2017)  . Collagenous colitis    chronic--- intermittant between diarrahea and constipation  . ED (erectile dysfunction)   . HCAP (healthcare-associated pneumonia)   . History of adenomatous polyp of colon   . History of Clostridium difficile colitis 05/2016  . History of pneumonia 11/08/2017   CAP -- LLL---  12-19-2017  per pt no residual symptoms  . History of SCC (squamous cell carcinoma) of skin   . History of sepsis    11-20-2017 severe sepsis due to UTI;  10/ 2017sepsis due to c-diff colitis  . Leucocytosis    chronic  . Lower urinary tract symptoms (LUTS)   . Macrocytic anemia    since 02/ 2015  . MGUS (monoclonal gammopathy of unknown significance)    hemotologist-  [name] @ [facility]  . Monoclonal paraproteinemia   . Neuropathy, peripheral   . Nodular basal cell carcinoma (BCC) 10/01/2018   Left Neck(Nodular) Pt Does Not Want Treatment  . Nodular basal cell carcinoma (BCC) 10/01/2018   Mid Forehead (treament curet and excision)  . OA (osteoarthritis)   . Prostatic stone   .  Rash of face    RIGHT SIDE  . Raynaud's syndrome   . Recurrent BCC (basal cell carcinoma) 03/05/2019   positive margin  . SCC (squamous cell carcinoma) 08/10/2009   Central Forehead (Moh's Dr. Rhona Raider @ [facility])  . SCC (squamous cell carcinoma) 12/08/2011   CIS-Left Cheek (treatment [facility] @ [facility])  . SCC (squamous cell carcinoma) 07/09/2015   CIS-Left Forehead (treament @ [facility])  . Septic shock (HCC)    x 3 w/UTI  . Squamous cell carcinoma in situ (SCCIS) 12/08/2011   Left Cheek (treatment Aldara @ [facility])   . Superficial basal cell carcinoma (BCC) 10/01/2018   Left Shin-Pt does not want treatment  . Thin skin    fragile  . Transfusion-dependent anemia since 10/ 2017   last transfusion PRBCs 07-19-2017  per hemologist note dated 10-25-2017  . Type 2 diabetes mellitus treated with  insulin ([place])    followed by [name] (pcp)  . Urinary hesitancy   . Wears contact lenses    LEFT EYE ONLY    SURGICAL HISTORY: Past Surgical History:  Procedure Laterality Date  . BONE MARROW BIOPSY Right 05-24-2016;  08-20-2014  . CARDIOVASCULAR STRESS TEST  11-26-2012   [name]  @ [facility] ([place]   normal nuclear study w/ no ischemia/  normal LV function and wall motion , ef 60% (find in care everywhere,epic)  . CATARACT EXTRACTION W/ INTRAOCULAR LENS  IMPLANT, BILATERAL  2015  . CYSTOSCOPY WITH LITHOLAPAXY N/A 12/25/2017   Procedure: CYSTOSCOPY WITH LITHOLAPAXY AND FULGERATION;  Surgeon: [name], MD;  Location: [facility];  Service: Urology;  Laterality: N/A;  . INGUINAL HERNIA REPAIR Right 1980s  . LOOP RECORDER INSERTION N/A 04/10/2018   Procedure: LOOP RECORDER INSERTION;  Surgeon: [name], MD;  Location: [facility];  Service: Cardiovascular;  Laterality: N/A;  . PENILE PROSTHESIS IMPLANT  12-02-2015   dr [name] @ [facility] in [place]  . SHOULDER SURGERY Left 08/2016  . TONSILLECTOMY  child  . TRANSURETHRAL RESECTION OF PROSTATE  2009   AND REPAIR RECURRENT RIGHT INGUINAL HERNIA    SOCIAL HISTORY: Social History   Socioeconomic History  . Marital status: Widowed    Spouse name: engaged  . Number of children: 8  . Years of education: college-2  . Highest education level: Not on file  Occupational History  . Occupation: Press photographer  . 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 Smoking status: Former Smoker    Years: 20.00    Types: Cigarettes    Quit date: 05/04/1996    Years since quitting: 23.7  . Smokeless tobacco: Never Used  Substance and Sexual Activity  . Alcohol use: Yes    Comment: 1-2 glasses of wine every other day  . Drug use: No  . Sexual activity: Never  Other Topics Concern  . Not on file  Social History Narrative  . Not on file   Social Determinants of Health   Financial  Resource Strain:   . Difficulty of Paying Living Expenses:   Food Insecurity:   . Worried About Charity fundraiser in the Last Year:   . Arboriculturist in the Last Year:   Transportation Needs:   . Film/video editor (Medical):   Marland Kitchen Lack of Transportation (Non-Medical):   Physical Activity:   . Days of Exercise per Week:   . Minutes of Exercise per Session:   Stress:   .  Feeling of Stress :   Social Connections:   . Frequency of Communication with Friends and Family:   . Frequency of Social Gatherings with Friends and Family:   . Attends Religious Services:   . Active Member of Clubs or Organizations:   . Attends Archivist Meetings:   Marland Kitchen Marital Status:   Intimate Partner Violence:   . Fear of Current or Ex-Partner:   . Emotionally Abused:   Marland Kitchen Physically Abused:   . Sexually Abused:     FAMILY HISTORY: Family History  Problem Relation Age of Onset  . Diabetes Mother   . Heart failure Mother     ALLERGIES:  is allergic to bee venom; metformin and related; and sulfamethoxazole-trimethoprim.  MEDICATIONS:  Current Outpatient Medications  Medication Sig Dispense Refill  . acetaminophen (TYLENOL) 325 MG tablet Take 2 tablets (650 mg total) by mouth every 6 (six) hours as needed for mild pain (or Fever >/= 101).    . cetirizine (ZYRTEC) 10 MG tablet Take 10 mg by mouth daily.    . Cranberry 400 MG TABS Take by mouth daily.    . D-Mannose 500 MG CAPS Take 500 mg by mouth daily.    . Insulin Glargine-Lixisenatide (SOLIQUA) 100-33 UNT-MCG/ML SOPN Inject 28 Units into the skin at bedtime.     . methotrexate (RHEUMATREX) 2.5 MG tablet Take 30 mg by mouth every Tuesday.     Marland Kitchen NOVOLOG FLEXPEN 100 UNIT/ML FlexPen INJECT 4 UNITS TWICE A DAY BEFORE MEALS    . pioglitazone (ACTOS) 15 MG tablet Take 15 mg by mouth every Peterson.     . gabapentin (NEURONTIN) 100 MG capsule TAKE 1 CAPSULE BY MOUTH THREE TIMES A DAY (Patient not taking: Reported on 09/16/2019) 270 capsule 2    No current facility-administered medications for this visit.    REVIEW OF SYSTEMS:   A 10+ POINT REVIEW OF SYSTEMS WAS OBTAINED including neurology, dermatology, psychiatry, cardiac, respiratory, lymph, extremities, GI, GU, Musculoskeletal, constitutional, breasts, reproductive, HEENT.  All pertinent positives are noted in the HPI.  All others are negative.   PHYSICAL EXAMINATION: ECOG PERFORMANCE STATUS: 1 - Symptomatic but completely ambulatory  . Vitals:   01/14/20 1023  BP: 127/69  Pulse: 80  Resp: 18  Temp: 97.9 F (36.6 C)  SpO2: 98%   Filed Weights   01/14/20 1023  Weight: 175 lb 8 oz (79.6 kg)   .Body mass index is 25.55 kg/m.   GENERAL:alert, in no acute distress and comfortable SKIN: no acute rashes, no significant lesions EYES: conjunctiva are pink and non-injected, sclera anicteric OROPHARYNX: MMM, no exudates, no oropharyngeal erythema or ulceration NECK: supple, no JVD LYMPH:  no palpable lymphadenopathy in the cervical, axillary or inguinal regions LUNGS: clear to auscultation b/l with normal respiratory effort HEART: regular rate & rhythm ABDOMEN:  normoactive bowel sounds , non tender, not distended. No palpable hepatosplenomegaly.  Extremity: no pedal edema PSYCH: alert & oriented x 3 with fluent speech NEURO: no focal motor/sensory deficits  LABORATORY DATA:  I have reviewed the data as listed  . CBC Latest Ref Rng & Units 01/14/2020 11/14/2019 09/16/2019  WBC 4.0 - 10.5 K/uL 11.9(H) 8.8 8.7  Hemoglobin 13.0 - 17.0 g/dL 11.8(L) 12.0(L) 12.2(L)  Hematocrit 39.0 - 52.0 % 35.8(L) 36.6(L) 35.7(L)  Platelets 150 - 400 K/uL 249 281 284    . CMP Latest Ref Rng & Units 01/14/2020 11/14/2019 09/16/2019  Glucose 70 - 99 mg/dL 111(H) 167(H) 121(H)  BUN 8 - 23 mg/dL  _0 Creatinine 0.61 - 1.24 mg/dL 0.65 0.67 0.60(L)  Sodium 135 - 145 mmol/L 141 138 139  Potassium 3.5 - 5.1 mmol/L 3.9 4.1 4.2  Chloride 98 - 111 mmol/L 108 106 108  CO2 22 - 32 mmol/L _1 Calcium 8.9 - 10.3 mg/dL 8.7(L) 9.0 8.8(L)  Total Protein 6.5 - 8.1 g/dL 6.6 6.9 7.0  Total Bilirubin 0.3 - 1.2 mg/dL 1.0 1.0 1.0  Alkaline Phos 38 - 126 U/L 82 103 84  AST 15 - 41 U/L _2 ALT 0 - 44 U/L 32 29 30   09/28/2016 Flow Cytometry:   RADIOGRAPHIC STUDIES: I have personally reviewed the radiological images as listed and agreed with the findings in the report. No results found.  ASSESSMENT & PLAN:   79 yo with   1) T cell large granular leukemia s/p transfusion dependent anemia - currently on MTX 42m po weekly 2) IgG Lambda MGUS  PLAN: -Discussed pt labwork today, 01/14/20; Lymphocytes slightly higher today, blood counts are stable, kidney and liver function is steady -Pt's Large Granular Lymphocytic Leukemia appears stable at this time. Will give blood transfusions as needed.  -Continue 30 mg Methotrexate once per week. Will keep same dose of Methotrexate due to stable lymphocyte count.  -Recommend pt receive the COVID19 vaccine - he declines at this time -Recommend pt f/u with Dr. DJanie MorningASAP for a stool study /patient declined having this collected at the cancer center -Recommend pt f/u with GI for Entocort management if indicated. -Will see back in 10 weeks with labs    FOLLOW UP: RTC with Dr KIrene Limbowith labs in 10 weeks   The total time spent in the appt was 20 minutes and more than 50% was on counseling and direct patient cares.  All of the patient's questions were answered with apparent satisfaction. The patient knows to call the clinic with any problems, questions or concerns.   GSullivan LoneMD MGove CityAAHIVMS SSequoyah Memorial HospitalCKindred Hospital MelbourneHematology/Oncology Physician CSt. Luke'[facility] (Office):       [phone](Work cell):  [phone](Fax):           [phone] 01/14/2020 10:48 AM  I, JYevette Edwards am acting as a scribe for Dr. GSullivan Lone   .I have reviewed the above documentation for accuracy and completeness, and I agree with the  above. .Brunetta GeneraMD

## 2020-01-16 ENCOUNTER — Telehealth: Payer: Self-pay | Admitting: Hematology

## 2020-01-16 NOTE — Telephone Encounter (Signed)
Scheduled per 06/09 los, patient has been called and notified. 

## 2020-02-04 ENCOUNTER — Telehealth: Payer: Self-pay | Admitting: Neurology

## 2020-02-04 NOTE — Telephone Encounter (Signed)
Pt called stating that his Paresthesia is getting worse and it was explained to him that the provider is only in every other week and he is very booked but he could be seen by the NP. The pt proceeded to get nasty and rude and talking over me stating that it is a waste of his time and money and his insurance money to see a NP. He refuses to come in to see a NP. He asked to just be scheduled with another provider. It was explained to him that he can not just be scheduled with any other provider and pt proceeded to get loud and rude again. I informed him that I would send a message to the nurse to be called. Pt ended the call.

## 2020-02-04 NOTE — Telephone Encounter (Signed)
Please call and offer the next available appointment with Dr. Jannifer Franklin to the patient. You can also explain that he can request to switch to another provider. We can send that request to Dr. Jannifer Franklin and if he agrees then we can see if another provider is willing to see him. Thanks.

## 2020-02-05 NOTE — Telephone Encounter (Addendum)
I called pt back to schedule him with Dr. Jannifer Franklin or Judson Roch NP. Pt stated his right hand and arm is giving him problems and he has neuropathy in it. I stated to pt he can see Judson Roch NP who works with Dr. Benson Norway. He began to say "what can a NP do, she  cant do everything a doctor can do, can she explain whats going on with me. I explain that NP work independently and they are a mid level provider and  prescribed medications, order scans, labs and treat ongoing chronic issues for pts. PT than stated he will see Sarah NP if she had any sooner appts. I explain to pt there was a cancellation on Dr. Jannifer Franklin schedule for July 21 at 1000am. I advise he check in at 0930, wear mask  No kids and one visitor. Pt verbalized understanding.

## 2020-02-05 NOTE — Telephone Encounter (Signed)
Revised. 

## 2020-02-16 DIAGNOSIS — Z87440 Personal history of urinary (tract) infections: Secondary | ICD-10-CM | POA: Diagnosis not present

## 2020-02-16 DIAGNOSIS — Z6825 Body mass index (BMI) 25.0-25.9, adult: Secondary | ICD-10-CM | POA: Diagnosis not present

## 2020-02-16 DIAGNOSIS — Z794 Long term (current) use of insulin: Secondary | ICD-10-CM | POA: Diagnosis not present

## 2020-02-16 DIAGNOSIS — E119 Type 2 diabetes mellitus without complications: Secondary | ICD-10-CM | POA: Diagnosis not present

## 2020-02-16 DIAGNOSIS — C91Z0 Other lymphoid leukemia not having achieved remission: Secondary | ICD-10-CM | POA: Diagnosis not present

## 2020-02-16 DIAGNOSIS — E1140 Type 2 diabetes mellitus with diabetic neuropathy, unspecified: Secondary | ICD-10-CM | POA: Diagnosis not present

## 2020-02-16 DIAGNOSIS — E118 Type 2 diabetes mellitus with unspecified complications: Secondary | ICD-10-CM | POA: Diagnosis not present

## 2020-02-16 DIAGNOSIS — K529 Noninfective gastroenteritis and colitis, unspecified: Secondary | ICD-10-CM | POA: Diagnosis not present

## 2020-02-16 DIAGNOSIS — D472 Monoclonal gammopathy: Secondary | ICD-10-CM | POA: Diagnosis not present

## 2020-02-18 DIAGNOSIS — L570 Actinic keratosis: Secondary | ICD-10-CM | POA: Diagnosis not present

## 2020-02-18 DIAGNOSIS — L814 Other melanin hyperpigmentation: Secondary | ICD-10-CM | POA: Diagnosis not present

## 2020-02-18 DIAGNOSIS — Z85828 Personal history of other malignant neoplasm of skin: Secondary | ICD-10-CM | POA: Diagnosis not present

## 2020-02-18 DIAGNOSIS — L578 Other skin changes due to chronic exposure to nonionizing radiation: Secondary | ICD-10-CM | POA: Diagnosis not present

## 2020-02-18 DIAGNOSIS — L821 Other seborrheic keratosis: Secondary | ICD-10-CM | POA: Diagnosis not present

## 2020-02-25 ENCOUNTER — Ambulatory Visit: Payer: Self-pay | Admitting: Neurology

## 2020-03-01 DIAGNOSIS — K4090 Unilateral inguinal hernia, without obstruction or gangrene, not specified as recurrent: Secondary | ICD-10-CM | POA: Diagnosis not present

## 2020-03-01 DIAGNOSIS — K52831 Collagenous colitis: Secondary | ICD-10-CM | POA: Diagnosis not present

## 2020-03-22 DIAGNOSIS — R197 Diarrhea, unspecified: Secondary | ICD-10-CM | POA: Diagnosis not present

## 2020-03-22 DIAGNOSIS — C9591 Leukemia, unspecified, in remission: Secondary | ICD-10-CM | POA: Diagnosis not present

## 2020-03-22 DIAGNOSIS — K52831 Collagenous colitis: Secondary | ICD-10-CM | POA: Diagnosis not present

## 2020-03-22 DIAGNOSIS — R1084 Generalized abdominal pain: Secondary | ICD-10-CM | POA: Diagnosis not present

## 2020-03-24 ENCOUNTER — Inpatient Hospital Stay (HOSPITAL_BASED_OUTPATIENT_CLINIC_OR_DEPARTMENT_OTHER): Payer: Medicare HMO | Admitting: Hematology

## 2020-03-24 ENCOUNTER — Inpatient Hospital Stay: Payer: Medicare HMO | Attending: Hematology

## 2020-03-24 ENCOUNTER — Other Ambulatory Visit: Payer: Self-pay

## 2020-03-24 VITALS — BP 122/67 | HR 84 | Temp 98.6°F | Resp 18 | Ht 69.5 in | Wt 168.9 lb

## 2020-03-24 DIAGNOSIS — N400 Enlarged prostate without lower urinary tract symptoms: Secondary | ICD-10-CM | POA: Diagnosis not present

## 2020-03-24 DIAGNOSIS — D649 Anemia, unspecified: Secondary | ICD-10-CM

## 2020-03-24 DIAGNOSIS — Z87891 Personal history of nicotine dependence: Secondary | ICD-10-CM | POA: Insufficient documentation

## 2020-03-24 DIAGNOSIS — Z85828 Personal history of other malignant neoplasm of skin: Secondary | ICD-10-CM | POA: Insufficient documentation

## 2020-03-24 DIAGNOSIS — Z8701 Personal history of pneumonia (recurrent): Secondary | ICD-10-CM | POA: Insufficient documentation

## 2020-03-24 DIAGNOSIS — E119 Type 2 diabetes mellitus without complications: Secondary | ICD-10-CM | POA: Diagnosis not present

## 2020-03-24 DIAGNOSIS — K4090 Unilateral inguinal hernia, without obstruction or gangrene, not specified as recurrent: Secondary | ICD-10-CM | POA: Insufficient documentation

## 2020-03-24 DIAGNOSIS — Z86010 Personal history of colonic polyps: Secondary | ICD-10-CM | POA: Diagnosis not present

## 2020-03-24 DIAGNOSIS — N529 Male erectile dysfunction, unspecified: Secondary | ICD-10-CM | POA: Diagnosis not present

## 2020-03-24 DIAGNOSIS — C91Z0 Other lymphoid leukemia not having achieved remission: Secondary | ICD-10-CM

## 2020-03-24 DIAGNOSIS — R197 Diarrhea, unspecified: Secondary | ICD-10-CM | POA: Diagnosis not present

## 2020-03-24 DIAGNOSIS — G629 Polyneuropathy, unspecified: Secondary | ICD-10-CM | POA: Insufficient documentation

## 2020-03-24 DIAGNOSIS — D472 Monoclonal gammopathy: Secondary | ICD-10-CM

## 2020-03-24 LAB — CBC WITH DIFFERENTIAL/PLATELET
Abs Immature Granulocytes: 0.12 10*3/uL — ABNORMAL HIGH (ref 0.00–0.07)
Basophils Absolute: 0 10*3/uL (ref 0.0–0.1)
Basophils Relative: 0 %
Eosinophils Absolute: 0 10*3/uL (ref 0.0–0.5)
Eosinophils Relative: 0 %
HCT: 25.7 % — ABNORMAL LOW (ref 39.0–52.0)
Hemoglobin: 8.3 g/dL — ABNORMAL LOW (ref 13.0–17.0)
Immature Granulocytes: 1 %
Lymphocytes Relative: 56 %
Lymphs Abs: 6.5 10*3/uL — ABNORMAL HIGH (ref 0.7–4.0)
MCH: 40.9 pg — ABNORMAL HIGH (ref 26.0–34.0)
MCHC: 32.3 g/dL (ref 30.0–36.0)
MCV: 126.6 fL — ABNORMAL HIGH (ref 80.0–100.0)
Monocytes Absolute: 1.3 10*3/uL — ABNORMAL HIGH (ref 0.1–1.0)
Monocytes Relative: 11 %
Neutro Abs: 3.8 10*3/uL (ref 1.7–7.7)
Neutrophils Relative %: 32 %
Platelets: 351 10*3/uL (ref 150–400)
RBC: 2.03 MIL/uL — ABNORMAL LOW (ref 4.22–5.81)
RDW: 18.2 % — ABNORMAL HIGH (ref 11.5–15.5)
WBC: 11.7 10*3/uL — ABNORMAL HIGH (ref 4.0–10.5)
nRBC: 0 % (ref 0.0–0.2)

## 2020-03-24 LAB — CMP (CANCER CENTER ONLY)
ALT: 29 U/L (ref 0–44)
AST: 17 U/L (ref 15–41)
Albumin: 3.3 g/dL — ABNORMAL LOW (ref 3.5–5.0)
Alkaline Phosphatase: 83 U/L (ref 38–126)
Anion gap: 6 (ref 5–15)
BUN: 10 mg/dL (ref 8–23)
CO2: 25 mmol/L (ref 22–32)
Calcium: 9.2 mg/dL (ref 8.9–10.3)
Chloride: 106 mmol/L (ref 98–111)
Creatinine: 0.63 mg/dL (ref 0.61–1.24)
GFR, Est AFR Am: >60 mL/min (ref >60)
GFR, Estimated: >60 mL/min (ref >60)
Glucose, Bld: 169 mg/dL — ABNORMAL HIGH (ref 70–99)
Potassium: 4.3 mmol/L (ref 3.5–5.1)
Sodium: 137 mmol/L (ref 135–145)
Total Bilirubin: 0.9 mg/dL (ref 0.3–1.2)
Total Protein: 6.3 g/dL — ABNORMAL LOW (ref 6.5–8.1)

## 2020-03-24 LAB — SAMPLE TO BLOOD BANK

## 2020-03-24 LAB — LACTATE DEHYDROGENASE: LDH: 171 U/L (ref 98–192)

## 2020-03-24 NOTE — Progress Notes (Signed)
HEMATOLOGY/ONCOLOGY CLINIC NOTE  Date of Service: 03/24/2020  Patient Care Team: [facility] [place] as PCP - General (Rheumatology) Collier Bullock, MD as Referring Physician (Hematology and Oncology) [name], DO as Referring Physician (Family Medicine)  CHIEF COMPLAINTS/PURPOSE OF CONSULTATION:  Large Granular Lymphocytic Leukemia  Hem/Onc History  1)- chronic leukocytosis (neutrophilia and absolute lymphocytosis), peripheral blood lymphocytes about 5000, about 1/4 of cells have LGL morphology 2)- macrocytic anemia at least since 09/08/2013 (normal CBC on 11/28/2012), MCV now as high as 115, low retics, marrow M:E ratio 15:1 in 05/2016, erythroid hypoplasia 3)- MGUS serum IgG lambda 0.4 g/dL in 07/2014, follow annually 4)- 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 weekly -03/29/2017 increase dose 25 mg po qweek -07/19/2017 increase mtx 30 mg po qweek, stable dose since then   HISTORY OF PRESENTING ILLNESS:  Tommy Peterson is a wonderful 79 y.o. male who has been referred to [place] by Dr [name] for evaluation and management of Large Granular Lymphocytic Leukemia. The pt reports that he is doing well overall.  Pt is here as he is moving his care because his previous Oncologist, Dr. [name], has changed practices. The pt reports that he needed 2 pints of blood every 2 weeks for a year beginning in 2014. Pt has never needed to any erythropoeitin injections. His Hgb has been holding higher  lately, around 10-11. It has been about 7 months since pt has needed his last blood transfusion. Pt has been taking 30 mg of Methotrexate once per week and denies any issues with that dosage. He has continued taking 1 mg Folic Acid once per day. He is also taking a daily multivitamin.   Pt was previously walking a lot but had to slow down due to fatigue. Pt will need a surgery to repair his left inguinal hernia.   In 2017 pt had a C. Diff infection. Pt had recurrent UTI infections in 2019. His last hospitalization for infections was in January 2020. There were no kidney stones or other reasons for repeat infections. Pt denies any pneumonia infections. Pt has had Diabetes for 40+ years and it is currently well controlled. Pt was never given a direct reason for chronic diarrhea but was placed on Budesonide which stopped his symptoms. Pt is currently taking 3 mg twice per week. He notes that he has been experiencing some urinary frequency, but does feel like he is completely emptying his bladder when he uses the restroom. Pt has seen a Urologist who gave him some medication that caused him to urinate up to 30 times per day. He has tried Flomax previously, and still has some at home, but notes that it does not help much. Pt is following with a Dermatologist , Dr. Rhona Raider, for his skin Squamous Cell Carcinoma. He is currently using Efudex to treat. He was having some night sweats about a year ago, but has had none recently.   Most recent lab results (08/28/2019) of CBC w/diff and CMP is as follows: all values are WNL except for Hgb at  11.8, HCT at 35.1, MCV at 122, MCH at 41.1, RDW at 2.87, RDW at 16.0, Abs Immature Granulocyte at 0.11K, Immature Granulocyte Rel at 1.2, Mono Abs at 1.0K, Creatinine at 0.5, Glucose at 199.  On review of systems, pt reports constipation, fatigue and denies diarrhea, unexpected weight loss, fevers, chills, night sweats, abdominal pain, leg swelling and any other symptoms.    On PMHx the pt reports BPH, Chronic Diarrhea, Chronic Large Granular Lymphocytic Leukemia, Collagenous Collitis, Clostridium defficile colitis, Chronic UTIs, Transfusion-dependant anemia, Type 2 diabetes mellitus.  INTERVAL HISTORY:  Tommy Peterson is a wonderful 79 y.o. male who is here for evaluation and management of Large Granular Lymphocytic Leukemia. The patient's last visit with [place] was on 01/14/2020. The pt reports that he is doing well overall.  The pt reports that he is just getting over a bad cold that lasted three weeks. He did not require any medications during this time. Pt has been vaccinated against the [place] virus, but did not get tested for [place].  Pt is beginning care with a new Gastroenterologist, as his last retired. Pt was placed back on Entocort. He is also in need of a left inguinal hernia repair. His fasting blood glucose levels have been ranging from 150-180. Pt takes Folic acid, Actos, Methotrexate, and Vitamin B12 daily.   Lab results today (03/24/20) of CBC w/diff and CMP is as follows: all values are WNL except for WBC at 11.7K, RBC at 2.03, Hgb at 8.3, HCT at 25.7, MCV at 126.6, MCH at 40.9, RDW at 18.2, Lymphs Abs at 6.5K, Mono Abs at 1.3K, WBC Morphology shows "Atypical Lymphocytes Present", Abs Immature Granulocytes at 0.12K, Smudge Cells are "Present", Glucose at 169, Total Protein at 6.3, Albumin at 3.3. 03/24/2020 LDH at 171 03/24/2020 MMP is in progress  On review of systems, pt reports SOB, weakness, cough and denies new lumps/bumps, lightheadedness, dizziness and any other symptoms.   Past Medical History:  Diagnosis Date   Acute respiratory failure ([place])    BCC (basal cell carcinoma of skin) 08/15/2016   w/SCC Left Forehead (treatment @ [facility])   BPH (benign prostatic hyperplasia)    Carpal tunnel syndrome on both sides    Chronic diarrhea    intermittant due to chronic collagenous colitis   Chronic large granular lymphocytic  leukemia ([place]) primary hemotologist-  [name]. 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--  treatment weekly methotraxate and transfusion's (last PRBCs 07-19-2017)   Collagenous colitis    chronic--- intermittant between diarrahea and constipation   ED (erectile dysfunction)    HCAP (healthcare-associated pneumonia)    History of adenomatous polyp of colon    History of Clostridium difficile colitis 05/2016   History of pneumonia 11/08/2017   CAP -- LLL---  12-19-2017  per pt no residual symptoms   History of SCC (squamous cell carcinoma) of skin    History of sepsis    11-20-2017 severe sepsis due to UTI;  10/ 2017sepsis due to c-diff colitis   Leucocytosis    chronic   Lower urinary tract symptoms (LUTS)    Macrocytic anemia    since 02/ 2015   MGUS (monoclonal gammopathy of unknown significance)    hemotologist-  [name] @ [facility]   Monoclonal paraproteinemia    Neuropathy, peripheral    Nodular basal cell carcinoma (BCC) 10/01/2018  Left Neck(Nodular) Pt Does Not Want Treatment   Nodular basal cell carcinoma (BCC) 10/01/2018   Mid Forehead (treament curet and excision)   OA (osteoarthritis)    Prostatic stone    Rash of face    RIGHT SIDE   Raynaud's syndrome    Recurrent BCC (basal cell carcinoma) 03/05/2019   positive margin   SCC (squamous cell carcinoma) 08/10/2009   Central Forehead (Moh's Dr. Rhona Raider @ [facility])   SCC (squamous cell carcinoma) 12/08/2011   CIS-Left Cheek (treatment Aldara @ [facility])   SCC (squamous cell carcinoma) 07/09/2015   CIS-Left Forehead (treament @ [facility])   Septic shock ([place])    x 3 w/UTI   Squamous cell carcinoma in situ (SCCIS) 12/08/2011   Left Cheek (treatment Aldara @ [facility])    Superficial basal cell carcinoma ([place]) 10/01/2018   Left Shin-Pt  does not want treatment   Thin skin    fragile   Transfusion-dependent anemia since 10/ 2017   last transfusion PRBCs 07-19-2017  per hemologist note dated 10-25-2017   Type 2 diabetes mellitus treated with insulin ([place])    followed by [name] (pcp)   Urinary hesitancy    Wears contact lenses    LEFT EYE ONLY    SURGICAL HISTORY: Past Surgical History:  Procedure Laterality Date   BONE MARROW BIOPSY Right 05-24-2016;  08-20-2014   CARDIOVASCULAR STRESS TEST  11-26-2012   [name]  @ [place]   normal nuclear study w/ no ischemia/  normal LV function and wall motion , ef 60% (find in care everywhere,epic)   CATARACT EXTRACTION W/ INTRAOCULAR LENS  IMPLANT, BILATERAL  2015   CYSTOSCOPY WITH LITHOLAPAXY N/A 12/25/2017   Procedure: CYSTOSCOPY WITH LITHOLAPAXY AND FULGERATION;  Surgeon: [name], MD;  Location: [facility];  Service: Urology;  Laterality: N/A;   INGUINAL HERNIA REPAIR Right 1980s   LOOP RECORDER INSERTION N/A 04/10/2018   Procedure: LOOP RECORDER INSERTION;  Surgeon: [name], MD;  Location: [facility];  Service: Cardiovascular;  Laterality: N/A;   PENILE PROSTHESIS IMPLANT  12-02-2015   dr [name] @ [facility] in [place] Left 08/2016   TONSILLECTOMY  child   TRANSURETHRAL RESECTION OF PROSTATE  2009   AND REPAIR RECURRENT RIGHT INGUINAL HERNIA    SOCIAL HISTORY: Social History   Socioeconomic History   Marital status: Widowed    Spouse name: engaged   Number of children: 8   Years of education: college-2   Highest education level: Not on file  Occupational History   Occupation: Press photographer   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  Tobacco Use   Smoking status: Former Smoker    Years: 20.00    Types: Cigarettes    Quit date: 05/04/1996    Years since quitting: 23.9   Smokeless tobacco: Never Used  Vaping Use   Vaping Use: Never used  Substance and Sexual  Activity   Alcohol use: Yes    Comment: 1-2 glasses of wine every other day   Drug use: No   Sexual activity: Never  Other Topics Concern   Not on file  Social History Narrative   Not on file   Social Determinants of Health   Financial Resource Strain:    Difficulty of Paying Living Expenses:   Food Insecurity:    Worried About Charity fundraiser in the Last Year:    Arboriculturist in  the Last Year:   Transportation Needs:    Film/video editor (Medical):    Lack of Transportation (Non-Medical):   Physical Activity:    Days of Exercise per Week:    Minutes of Exercise per Session:   Stress:    Feeling of Stress :   Social Connections:    Frequency of Communication with Friends and Family:    Frequency of Social Gatherings with Friends and Family:    Attends Religious Services:    Active Member of Clubs or Organizations:    Attends Music therapist:    Marital Status:   Intimate Partner Violence:    Fear of Current or Ex-Partner:    Emotionally Abused:    Physically Abused:    Sexually Abused:     FAMILY HISTORY: Family History  Problem Relation Age of Onset   Diabetes Mother    Heart failure Mother     ALLERGIES:  is allergic to bee venom, metformin and related, and sulfamethoxazole-trimethoprim.  MEDICATIONS:  Current Outpatient Medications  Medication Sig Dispense Refill   acetaminophen (TYLENOL) 325 MG tablet Take 2 tablets (650 mg total) by mouth every 6 (six) hours as needed for mild pain (or Fever >/= 101).     cetirizine (ZYRTEC) 10 MG tablet Take 10 mg by mouth daily.     Cranberry 400 MG TABS Take by mouth daily.     D-Mannose 500 MG CAPS Take 500 mg by mouth daily.     gabapentin (NEURONTIN) 100 MG capsule TAKE 1 CAPSULE BY MOUTH THREE TIMES A DAY (Patient not taking: Reported on 09/16/2019) 270 capsule 2   Insulin Glargine-Lixisenatide (SOLIQUA) 100-33 UNT-MCG/ML SOPN Inject 28 Units into the skin at  bedtime.      methotrexate (RHEUMATREX) 2.5 MG tablet Take 30 mg by mouth every Tuesday.      NOVOLOG FLEXPEN 100 UNIT/ML FlexPen INJECT 4 UNITS TWICE A DAY BEFORE MEALS     pioglitazone (ACTOS) 15 MG tablet Take 15 mg by mouth every morning.      No current facility-administered medications for this visit.    REVIEW OF SYSTEMS:   A 10+ POINT REVIEW OF SYSTEMS WAS OBTAINED including neurology, dermatology, psychiatry, cardiac, respiratory, lymph, extremities, GI, GU, Musculoskeletal, constitutional, breasts, reproductive, HEENT.  All pertinent positives are noted in the HPI.  All others are negative.   PHYSICAL EXAMINATION: ECOG PERFORMANCE STATUS: 1 - Symptomatic but completely ambulatory  . Vitals:   03/24/20 1147  BP: 122/67  Pulse: 84  Resp: 18  Temp: 98.6 F (37 C)  SpO2: 98%   Filed Weights   03/24/20 1147  Weight: 168 lb 14.4 oz (76.6 kg)   .Body mass index is 24.58 kg/m.   GENERAL:alert, in no acute distress and comfortable SKIN: no acute rashes, no significant lesions EYES: conjunctiva are pink and non-injected, sclera anicteric OROPHARYNX: MMM, no exudates, no oropharyngeal erythema or ulceration NECK: supple, no JVD LYMPH:  no palpable lymphadenopathy in the cervical, axillary or inguinal regions LUNGS: clear to auscultation b/l with normal respiratory effort HEART: regular rate & rhythm ABDOMEN:  normoactive bowel sounds , non tender, not distended. No palpable hepatosplenomegaly.  Extremity: no pedal edema PSYCH: alert & oriented x 3 with fluent speech NEURO: no focal motor/sensory deficits  LABORATORY DATA:  I have reviewed the data as listed  . CBC Latest Ref Rng & Units 03/24/2020 01/14/2020 11/14/2019  WBC 4.0 - 10.5 K/uL 11.7(H) 11.9(H) 8.8  Hemoglobin 13.0 - 17.0 g/dL 8.3(L) 11.8(L)  12.0(L)  Hematocrit 39 - 52 % 25.7(L) 35.8(L) 36.6(L)  Platelets 150 - 400 K/uL 351 249 281    . CMP Latest Ref Rng & Units 03/24/2020 01/14/2020 11/14/2019  Glucose 70  - 99 mg/dL 169(H) 111(H) 167(H)  BUN 8 - 23 mg/dL $Remove'10 13 9  'ePLJKBY$ Creatinine 0.61 - 1.24 mg/dL 0.63 0.65 0.67  Sodium 135 - 145 mmol/L 137 141 138  Potassium 3.5 - 5.1 mmol/L 4.3 3.9 4.1  Chloride 98 - 111 mmol/L 106 108 106  CO2 22 - 32 mmol/L $RemoveB'25 24 27  'vYOZaMpk$ Calcium 8.9 - 10.3 mg/dL 9.2 8.7(L) 9.0  Total Protein 6.5 - 8.1 g/dL 6.3(L) 6.6 6.9  Total Bilirubin 0.3 - 1.2 mg/dL 0.9 1.0 1.0  Alkaline Phos 38 - 126 U/L 83 82 103  AST 15 - 41 U/L $Remo'17 18 22  'ofvZv$ ALT 0 - 44 U/L 29 32 29   09/28/2016 Flow Cytometry:   RADIOGRAPHIC STUDIES: I have personally reviewed the radiological images as listed and agreed with the findings in the report. No results found.  ASSESSMENT & PLAN:   79 yo with   1) T cell large granular leukemia s/p transfusion dependent anemia - currently on MTX $Remo'30mg'FIbvU$  po weekly 2) IgG Lambda MGUS  PLAN: -Discussed pt labwork today, 03/24/20; WBC is stable, PLT are nml, worsened anemia, blood chemistries are steady, LDH is WNL -Discussed 03/24/2020 MMP is in progress  -Pt's Large Granular Lymphocytic Leukemia appears stable at this time. Will give blood transfusions as needed. -Advised pt that immune suppression caused by Methotrexate and infection could cause blood count changes evidenced on labs. Less likely that T LGL has progressed to the point of causing anemia.  -Advised pt to hold Methotrexate to prevent worsening anemia.  -Advised pt to contact if he has an infection. Would hold Methotrexate if this is the case. -Recommend pt take 2 mg Folic acid daily -Recommend pt begin B-complex daily -Will get labs in 2 weeks  -Will see back in 4 weeks with labs    FOLLOW UP: Labs in 2 weeks Labs with MD visit in 4 weeks   The total time spent in the appt was 30 minutes and more than 50% was on counseling and direct patient cares.  All of the patient's questions were answered with apparent satisfaction. The patient knows to call the clinic with any problems, questions or  concerns.   Sullivan Lone MD [place] AAHIVMS [facility] Hematology/Oncology Physician [facility]  (Office):       [phone] (Work cell):  [phone] (Fax):           [phone]  03/24/2020 12:13 PM  I, Yevette Edwards, am acting as a scribe for Dr. Sullivan Lone.   .I have reviewed the above documentation for accuracy and completeness, and I agree with the above. Brunetta Genera MD

## 2020-03-25 ENCOUNTER — Telehealth: Payer: Self-pay | Admitting: Hematology

## 2020-03-25 NOTE — Telephone Encounter (Signed)
Scheduled appointments per 8/18 los. Left message for patient with appointment dates and times.

## 2020-03-29 LAB — MULTIPLE MYELOMA PANEL, SERUM
Albumin SerPl Elph-Mcnc: 3.3 g/dL (ref 2.9–4.4)
Albumin/Glob SerPl: 1.4 (ref 0.7–1.7)
Alpha 1: 0.2 g/dL (ref 0.0–0.4)
Alpha2 Glob SerPl Elph-Mcnc: 0.5 g/dL (ref 0.4–1.0)
B-Globulin SerPl Elph-Mcnc: 0.7 g/dL (ref 0.7–1.3)
Gamma Glob SerPl Elph-Mcnc: 1.1 g/dL (ref 0.4–1.8)
Globulin, Total: 2.5 g/dL (ref 2.2–3.9)
IgA: 309 mg/dL (ref 61–437)
IgG (Immunoglobin G), Serum: 1064 mg/dL (ref 603–1613)
IgM (Immunoglobulin M), Srm: 166 mg/dL — ABNORMAL HIGH (ref 15–143)
Total Protein ELP: 5.8 g/dL — ABNORMAL LOW (ref 6.0–8.5)

## 2020-04-06 ENCOUNTER — Inpatient Hospital Stay (HOSPITAL_COMMUNITY)
Admission: EM | Admit: 2020-04-06 | Discharge: 2020-04-10 | DRG: 871 | Disposition: A | Discharge status: Home / Self Care | Payer: Medicare HMO | Attending: Internal Medicine | Admitting: Internal Medicine

## 2020-04-06 ENCOUNTER — Telehealth: Payer: Self-pay | Admitting: *Deleted

## 2020-04-06 ENCOUNTER — Other Ambulatory Visit: Payer: Self-pay

## 2020-04-06 DIAGNOSIS — K529 Noninfective gastroenteritis and colitis, unspecified: Secondary | ICD-10-CM | POA: Diagnosis present

## 2020-04-06 DIAGNOSIS — I10 Essential (primary) hypertension: Secondary | ICD-10-CM | POA: Diagnosis not present

## 2020-04-06 DIAGNOSIS — I700 Atherosclerosis of aorta: Secondary | ICD-10-CM | POA: Diagnosis present

## 2020-04-06 DIAGNOSIS — R0902 Hypoxemia: Secondary | ICD-10-CM | POA: Diagnosis not present

## 2020-04-06 DIAGNOSIS — Z8249 Family history of ischemic heart disease and other diseases of the circulatory system: Secondary | ICD-10-CM

## 2020-04-06 DIAGNOSIS — C4491 Basal cell carcinoma of skin, unspecified: Secondary | ICD-10-CM | POA: Diagnosis present

## 2020-04-06 DIAGNOSIS — Z23 Encounter for immunization: Secondary | ICD-10-CM

## 2020-04-06 DIAGNOSIS — A419 Sepsis, unspecified organism: Secondary | ICD-10-CM | POA: Diagnosis present

## 2020-04-06 DIAGNOSIS — Z79899 Other long term (current) drug therapy: Secondary | ICD-10-CM

## 2020-04-06 DIAGNOSIS — N400 Enlarged prostate without lower urinary tract symptoms: Secondary | ICD-10-CM | POA: Diagnosis present

## 2020-04-06 DIAGNOSIS — J1282 Pneumonia due to coronavirus disease 2019: Secondary | ICD-10-CM | POA: Diagnosis present

## 2020-04-06 DIAGNOSIS — A4189 Other specified sepsis: Principal | ICD-10-CM | POA: Diagnosis present

## 2020-04-06 DIAGNOSIS — E119 Type 2 diabetes mellitus without complications: Secondary | ICD-10-CM

## 2020-04-06 DIAGNOSIS — U071 COVID-19: Secondary | ICD-10-CM | POA: Diagnosis not present

## 2020-04-06 DIAGNOSIS — I77810 Thoracic aortic ectasia: Secondary | ICD-10-CM | POA: Diagnosis present

## 2020-04-06 DIAGNOSIS — D649 Anemia, unspecified: Secondary | ICD-10-CM | POA: Diagnosis present

## 2020-04-06 DIAGNOSIS — C91Z0 Other lymphoid leukemia not having achieved remission: Secondary | ICD-10-CM | POA: Diagnosis present

## 2020-04-06 DIAGNOSIS — Z882 Allergy status to sulfonamides status: Secondary | ICD-10-CM

## 2020-04-06 DIAGNOSIS — I959 Hypotension, unspecified: Secondary | ICD-10-CM | POA: Diagnosis not present

## 2020-04-06 DIAGNOSIS — K52831 Collagenous colitis: Secondary | ICD-10-CM | POA: Diagnosis present

## 2020-04-06 DIAGNOSIS — J9601 Acute respiratory failure with hypoxia: Secondary | ICD-10-CM | POA: Diagnosis not present

## 2020-04-06 DIAGNOSIS — E1142 Type 2 diabetes mellitus with diabetic polyneuropathy: Secondary | ICD-10-CM | POA: Diagnosis present

## 2020-04-06 DIAGNOSIS — Z794 Long term (current) use of insulin: Secondary | ICD-10-CM

## 2020-04-06 DIAGNOSIS — I480 Paroxysmal atrial fibrillation: Secondary | ICD-10-CM | POA: Diagnosis present

## 2020-04-06 DIAGNOSIS — Z87891 Personal history of nicotine dependence: Secondary | ICD-10-CM

## 2020-04-06 DIAGNOSIS — D539 Nutritional anemia, unspecified: Secondary | ICD-10-CM | POA: Diagnosis present

## 2020-04-06 DIAGNOSIS — I491 Atrial premature depolarization: Secondary | ICD-10-CM | POA: Diagnosis not present

## 2020-04-06 DIAGNOSIS — D849 Immunodeficiency, unspecified: Secondary | ICD-10-CM | POA: Diagnosis present

## 2020-04-06 DIAGNOSIS — R197 Diarrhea, unspecified: Secondary | ICD-10-CM | POA: Diagnosis not present

## 2020-04-06 DIAGNOSIS — R000 Tachycardia, unspecified: Secondary | ICD-10-CM | POA: Diagnosis present

## 2020-04-06 DIAGNOSIS — Z91030 Bee allergy status: Secondary | ICD-10-CM

## 2020-04-06 DIAGNOSIS — D609 Acquired pure red cell aplasia, unspecified: Secondary | ICD-10-CM | POA: Diagnosis present

## 2020-04-06 DIAGNOSIS — Z888 Allergy status to other drugs, medicaments and biological substances status: Secondary | ICD-10-CM

## 2020-04-06 DIAGNOSIS — Z833 Family history of diabetes mellitus: Secondary | ICD-10-CM

## 2020-04-06 NOTE — Telephone Encounter (Signed)
Patient and daughter 'Abby' called. Patient states he was here on 8/18 and was anemic, but did not want a transfusion at that time. Since then, he has begun to feel weaker. Has lab appt scheduled for 9/2 and asked if if he would be able to get blood then. Patient then states he tested positive at the [place] for Covid on 8/27 - began having symptoms on 8/23. Daughter then reported patient having temperatures between 99-104, taking tylenol three times a day. Dr. Irene Limbo informed.  Contacted patient/daughter with Dr. Grier Mitts response: [name] should go to ED as soon as possible for evaluation as he is immunocompromised, anemic and has Covid.  Patient and daughter verbalized understanding. Daughter stated that she had just gotten off phone with PCP who gave exact same directions as Dr. Irene Limbo.

## 2020-04-07 ENCOUNTER — Emergency Department (HOSPITAL_COMMUNITY): Payer: Medicare HMO

## 2020-04-07 ENCOUNTER — Encounter (HOSPITAL_COMMUNITY): Payer: Self-pay | Admitting: Emergency Medicine

## 2020-04-07 DIAGNOSIS — Z79899 Other long term (current) drug therapy: Secondary | ICD-10-CM | POA: Diagnosis not present

## 2020-04-07 DIAGNOSIS — J1282 Pneumonia due to coronavirus disease 2019: Secondary | ICD-10-CM | POA: Diagnosis not present

## 2020-04-07 DIAGNOSIS — Z882 Allergy status to sulfonamides status: Secondary | ICD-10-CM | POA: Diagnosis not present

## 2020-04-07 DIAGNOSIS — A419 Sepsis, unspecified organism: Secondary | ICD-10-CM | POA: Diagnosis not present

## 2020-04-07 DIAGNOSIS — C91Z0 Other lymphoid leukemia not having achieved remission: Secondary | ICD-10-CM

## 2020-04-07 DIAGNOSIS — C4491 Basal cell carcinoma of skin, unspecified: Secondary | ICD-10-CM | POA: Diagnosis present

## 2020-04-07 DIAGNOSIS — U071 COVID-19: Secondary | ICD-10-CM | POA: Diagnosis not present

## 2020-04-07 DIAGNOSIS — E1142 Type 2 diabetes mellitus with diabetic polyneuropathy: Secondary | ICD-10-CM | POA: Diagnosis present

## 2020-04-07 DIAGNOSIS — I10 Essential (primary) hypertension: Secondary | ICD-10-CM | POA: Diagnosis not present

## 2020-04-07 DIAGNOSIS — Z833 Family history of diabetes mellitus: Secondary | ICD-10-CM | POA: Diagnosis not present

## 2020-04-07 DIAGNOSIS — I480 Paroxysmal atrial fibrillation: Secondary | ICD-10-CM | POA: Diagnosis present

## 2020-04-07 DIAGNOSIS — N400 Enlarged prostate without lower urinary tract symptoms: Secondary | ICD-10-CM | POA: Diagnosis present

## 2020-04-07 DIAGNOSIS — R000 Tachycardia, unspecified: Secondary | ICD-10-CM | POA: Diagnosis present

## 2020-04-07 DIAGNOSIS — D649 Anemia, unspecified: Secondary | ICD-10-CM | POA: Diagnosis not present

## 2020-04-07 DIAGNOSIS — Z888 Allergy status to other drugs, medicaments and biological substances status: Secondary | ICD-10-CM | POA: Diagnosis not present

## 2020-04-07 DIAGNOSIS — Z87891 Personal history of nicotine dependence: Secondary | ICD-10-CM | POA: Diagnosis not present

## 2020-04-07 DIAGNOSIS — K529 Noninfective gastroenteritis and colitis, unspecified: Secondary | ICD-10-CM | POA: Diagnosis not present

## 2020-04-07 DIAGNOSIS — Z794 Long term (current) use of insulin: Secondary | ICD-10-CM | POA: Diagnosis not present

## 2020-04-07 DIAGNOSIS — J9601 Acute respiratory failure with hypoxia: Secondary | ICD-10-CM | POA: Diagnosis not present

## 2020-04-07 DIAGNOSIS — I77810 Thoracic aortic ectasia: Secondary | ICD-10-CM | POA: Diagnosis present

## 2020-04-07 DIAGNOSIS — Z23 Encounter for immunization: Secondary | ICD-10-CM | POA: Diagnosis not present

## 2020-04-07 DIAGNOSIS — D609 Acquired pure red cell aplasia, unspecified: Secondary | ICD-10-CM | POA: Diagnosis not present

## 2020-04-07 DIAGNOSIS — D849 Immunodeficiency, unspecified: Secondary | ICD-10-CM | POA: Diagnosis not present

## 2020-04-07 DIAGNOSIS — A4189 Other specified sepsis: Secondary | ICD-10-CM | POA: Diagnosis not present

## 2020-04-07 DIAGNOSIS — I700 Atherosclerosis of aorta: Secondary | ICD-10-CM | POA: Diagnosis present

## 2020-04-07 DIAGNOSIS — Z8249 Family history of ischemic heart disease and other diseases of the circulatory system: Secondary | ICD-10-CM | POA: Diagnosis not present

## 2020-04-07 DIAGNOSIS — Z91030 Bee allergy status: Secondary | ICD-10-CM | POA: Diagnosis not present

## 2020-04-07 LAB — CBC WITH DIFFERENTIAL/PLATELET
Abs Immature Granulocytes: 0.04 10*3/uL (ref 0.00–0.07)
Basophils Absolute: 0 10*3/uL (ref 0.0–0.1)
Basophils Relative: 0 %
Eosinophils Absolute: 0 10*3/uL (ref 0.0–0.5)
Eosinophils Relative: 0 %
HCT: 25.4 % — ABNORMAL LOW (ref 39.0–52.0)
Hemoglobin: 8.2 g/dL — ABNORMAL LOW (ref 13.0–17.0)
Immature Granulocytes: 0 %
Lymphocytes Relative: 71 %
Lymphs Abs: 9 10*3/uL — ABNORMAL HIGH (ref 0.7–4.0)
MCH: 41.2 pg — ABNORMAL HIGH (ref 26.0–34.0)
MCHC: 32.3 g/dL (ref 30.0–36.0)
MCV: 127.6 fL — ABNORMAL HIGH (ref 80.0–100.0)
Monocytes Absolute: 1.1 10*3/uL — ABNORMAL HIGH (ref 0.1–1.0)
Monocytes Relative: 8 %
Neutro Abs: 2.7 10*3/uL (ref 1.7–7.7)
Neutrophils Relative %: 21 %
Platelets: 231 10*3/uL (ref 150–400)
RBC: 1.99 MIL/uL — ABNORMAL LOW (ref 4.22–5.81)
RDW: 18.9 % — ABNORMAL HIGH (ref 11.5–15.5)
WBC: 12.8 10*3/uL — ABNORMAL HIGH (ref 4.0–10.5)
nRBC: 0 % (ref 0.0–0.2)

## 2020-04-07 LAB — HEMOGLOBIN AND HEMATOCRIT, BLOOD
HCT: 25.5 % — ABNORMAL LOW (ref 39.0–52.0)
Hemoglobin: 8.2 g/dL — ABNORMAL LOW (ref 13.0–17.0)

## 2020-04-07 LAB — CREATININE, SERUM
Creatinine, Ser: 0.5 mg/dL — ABNORMAL LOW (ref 0.61–1.24)
GFR calc Af Amer: >60 mL/min (ref >60)
GFR calc non Af Amer: >60 mL/min (ref >60)

## 2020-04-07 LAB — COMPREHENSIVE METABOLIC PANEL
ALT: 46 U/L — ABNORMAL HIGH (ref 0–44)
AST: 37 U/L (ref 15–41)
Albumin: 3.5 g/dL (ref 3.5–5.0)
Alkaline Phosphatase: 55 U/L (ref 38–126)
Anion gap: 11 (ref 5–15)
BUN: 15 mg/dL (ref 8–23)
CO2: 23 mmol/L (ref 22–32)
Calcium: 8.6 mg/dL — ABNORMAL LOW (ref 8.9–10.3)
Chloride: 101 mmol/L (ref 98–111)
Creatinine, Ser: 0.54 mg/dL — ABNORMAL LOW (ref 0.61–1.24)
GFR calc Af Amer: >60 mL/min (ref >60)
GFR calc non Af Amer: >60 mL/min (ref >60)
Glucose, Bld: 133 mg/dL — ABNORMAL HIGH (ref 70–99)
Potassium: 3.4 mmol/L — ABNORMAL LOW (ref 3.5–5.1)
Sodium: 135 mmol/L (ref 135–145)
Total Bilirubin: 1 mg/dL (ref 0.3–1.2)
Total Protein: 6.4 g/dL — ABNORMAL LOW (ref 6.5–8.1)

## 2020-04-07 LAB — FERRITIN: Ferritin: 1905 ng/mL — ABNORMAL HIGH (ref 24–336)

## 2020-04-07 LAB — PROCALCITONIN: Procalcitonin: 0.27 ng/mL

## 2020-04-07 LAB — C-REACTIVE PROTEIN: CRP: 5.7 mg/dL — ABNORMAL HIGH (ref <1.0)

## 2020-04-07 LAB — D-DIMER, QUANTITATIVE: D-Dimer, Quant: 0.44 ug/mL-FEU (ref 0.00–0.50)

## 2020-04-07 LAB — LACTATE DEHYDROGENASE: LDH: 182 U/L (ref 98–192)

## 2020-04-07 LAB — LACTIC ACID, PLASMA: Lactic Acid, Venous: 1.2 mmol/L (ref 0.5–1.9)

## 2020-04-07 LAB — SARS CORONAVIRUS 2 BY RT PCR (HOSPITAL ORDER, PERFORMED IN ~~LOC~~ HOSPITAL LAB): SARS Coronavirus 2: POSITIVE — AB

## 2020-04-07 LAB — ABO/RH: ABO/RH(D): O POS

## 2020-04-07 LAB — CBG MONITORING, ED
Glucose-Capillary: 208 mg/dL — ABNORMAL HIGH (ref 70–99)
Glucose-Capillary: 163 mg/dL — ABNORMAL HIGH (ref 70–99)

## 2020-04-07 LAB — FIBRINOGEN: Fibrinogen: 387 mg/dL (ref 210–475)

## 2020-04-07 LAB — HEMOGLOBIN A1C
Hgb A1c MFr Bld: 7.5 % — ABNORMAL HIGH (ref 4.8–5.6)
Mean Plasma Glucose: 168.55 mg/dL

## 2020-04-07 LAB — TRIGLYCERIDES: Triglycerides: 111 mg/dL (ref <150)

## 2020-04-07 IMAGING — DX DG CHEST 1V PORT
1 series · 1 of 1 positions shown · non-contrast
Comparison: [DATE]

CLINICAL DATA: [HE] positive

EXAM:
PORTABLE CHEST 1 VIEW

[chest ap]
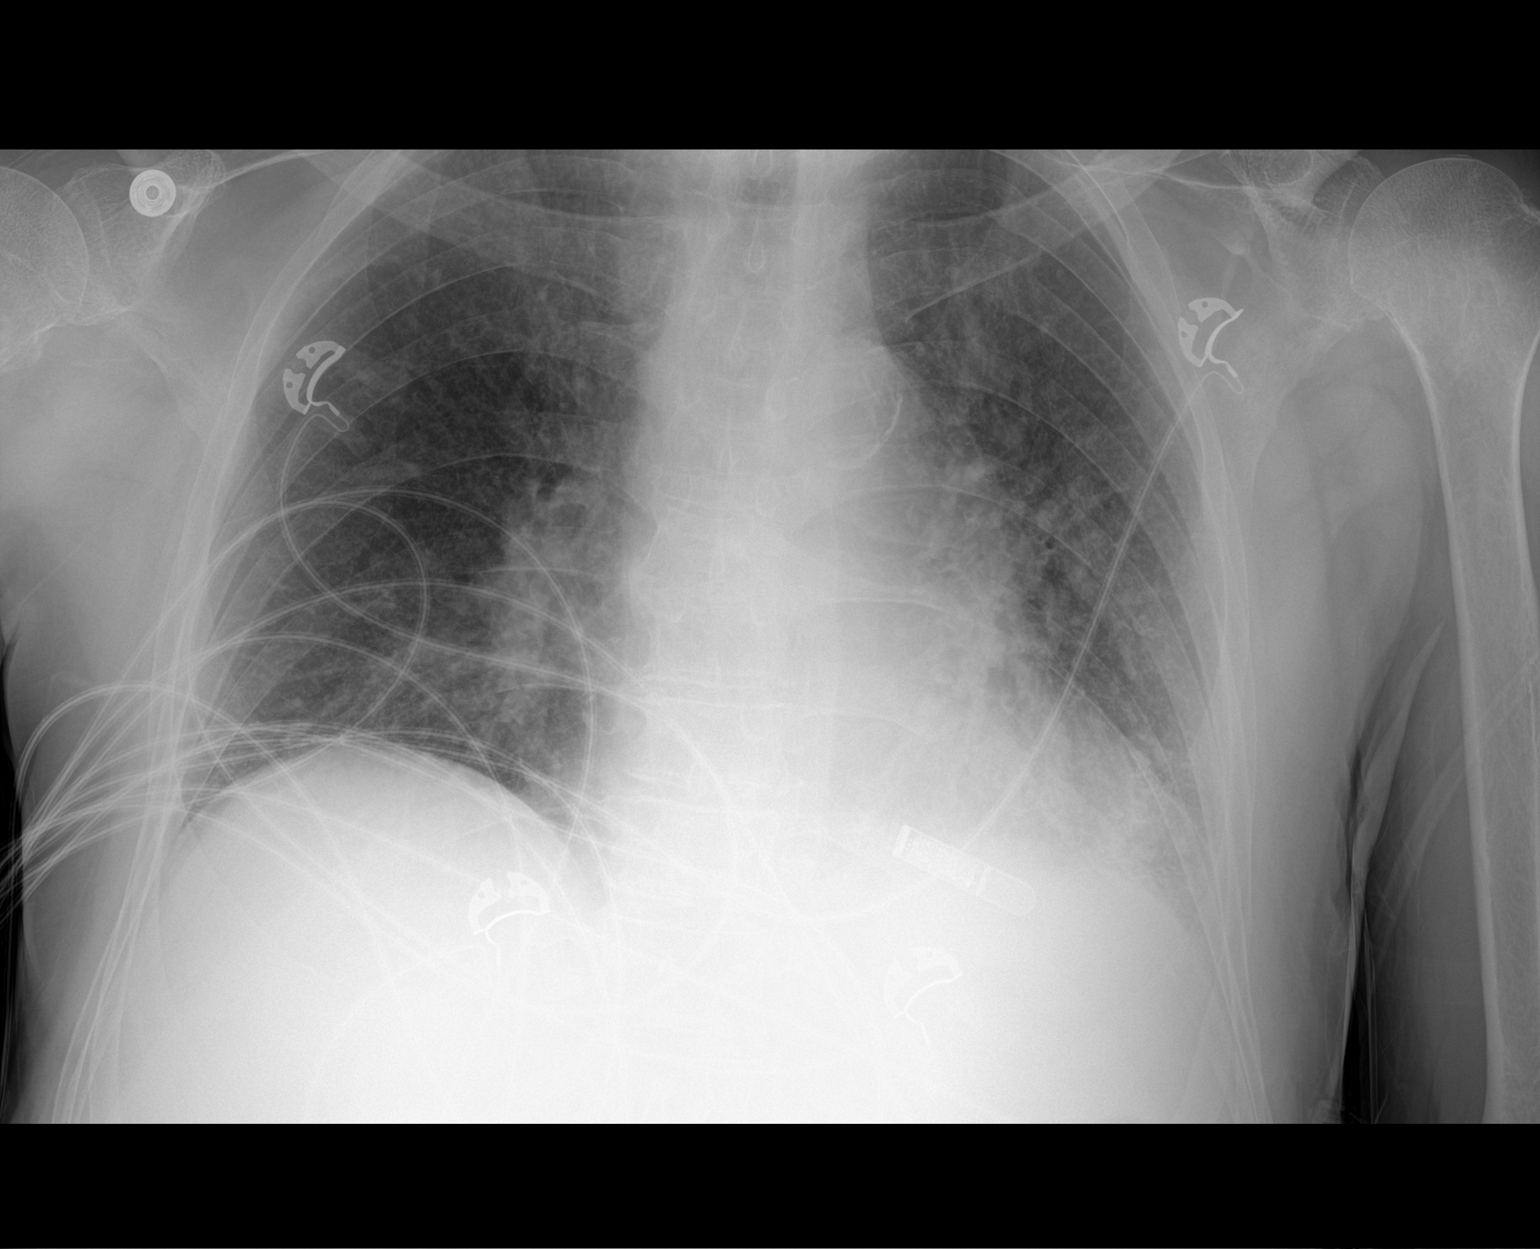

[1 of 1 positions shown; findings below may reference images not displayed]

FINDINGS: Single frontal view of the chest demonstrates persistent enlargement
of the cardiac silhouette. Ectasia of the thoracic aorta and
atherosclerosis of the aortic arch unchanged. Since the previous
exam, there is increased central vascular and interstitial
prominence. Patchy consolidation seen at the left lung base. No
large effusion or pneumothorax. No acute bony abnormality.
IMPRESSION: 1. Central vascular congestion, with diffuse increased interstitial
prominence and patchy left basilar consolidation. Appearance is
nonspecific and could reflect developing edema or infection.

## 2020-04-07 MED ORDER — METHYLPREDNISOLONE SODIUM SUCC 125 MG IJ SOLR: 125.0000 mg | Freq: Once | INTRAMUSCULAR | Status: DC | PRN

## 2020-04-07 MED ORDER — INSULIN DETEMIR 100 UNIT/ML ~~LOC~~ SOLN
0.1500 [IU]/kg | Freq: Two times a day (BID) | SUBCUTANEOUS | Status: DC
  Administered 2020-04-07 – 2020-04-10 (×7): 11 [IU] via SUBCUTANEOUS
  Filled 2020-04-07 (×7): qty 0.11

## 2020-04-07 MED ORDER — ONDANSETRON HCL 4 MG/2ML IJ SOLN
4.0000 mg | Freq: Once | INTRAMUSCULAR | Status: AC
  Administered 2020-04-07: 4 mg via INTRAVENOUS
  Filled 2020-04-07: qty 2

## 2020-04-07 MED ORDER — SODIUM CHLORIDE 0.9 % IV SOLN: 100.0000 mg | Freq: Every day | INTRAVENOUS | Status: DC

## 2020-04-07 MED ORDER — SODIUM CHLORIDE 0.9 % IV SOLN
1200.0000 mg | Freq: Once | INTRAVENOUS | Status: AC
  Administered 2020-04-07: 1200 mg via INTRAVENOUS
  Filled 2020-04-07: qty 1200

## 2020-04-07 MED ORDER — LACTATED RINGERS IV BOLUS (SEPSIS)
1000.0000 mL | Freq: Once | INTRAVENOUS | Status: AC
  Administered 2020-04-07: 1000 mL via INTRAVENOUS

## 2020-04-07 MED ORDER — SODIUM CHLORIDE 0.9 % IV SOLN: INTRAVENOUS | Status: DC | PRN

## 2020-04-07 MED ORDER — SODIUM CHLORIDE 0.9 % IV SOLN
2.0000 g | INTRAVENOUS | Status: DC
  Administered 2020-04-07 – 2020-04-09 (×3): 2 g via INTRAVENOUS
  Filled 2020-04-07 (×3): qty 20
  Filled 2020-04-07: qty 2

## 2020-04-07 MED ORDER — GUAIFENESIN-DM 100-10 MG/5ML PO SYRP
10.0000 mL | ORAL_SOLUTION | ORAL | Status: DC | PRN
  Filled 2020-04-07: qty 10

## 2020-04-07 MED ORDER — LINAGLIPTIN 5 MG PO TABS
5.0000 mg | ORAL_TABLET | Freq: Every day | ORAL | Status: DC
  Administered 2020-04-07 – 2020-04-10 (×4): 5 mg via ORAL
  Filled 2020-04-07 (×4): qty 1

## 2020-04-07 MED ORDER — SODIUM CHLORIDE 0.9 % IV SOLN
200.0000 mg | Freq: Once | INTRAVENOUS | Status: AC
  Administered 2020-04-07: 200 mg via INTRAVENOUS
  Filled 2020-04-07: qty 200

## 2020-04-07 MED ORDER — SODIUM CHLORIDE 0.9 % IV SOLN: 200.0000 mg | Freq: Once | INTRAVENOUS | Status: DC

## 2020-04-07 MED ORDER — BARICITINIB 2 MG PO TABS
2.0000 mg | ORAL_TABLET | Freq: Every day | ORAL | Status: DC
  Administered 2020-04-07: 2 mg via ORAL
  Filled 2020-04-07: qty 1

## 2020-04-07 MED ORDER — ENOXAPARIN SODIUM 40 MG/0.4ML ~~LOC~~ SOLN
40.0000 mg | SUBCUTANEOUS | Status: DC
  Administered 2020-04-07 – 2020-04-09 (×3): 40 mg via SUBCUTANEOUS
  Filled 2020-04-07 (×3): qty 0.4

## 2020-04-07 MED ORDER — ALBUTEROL SULFATE HFA 108 (90 BASE) MCG/ACT IN AERS
2.0000 | INHALATION_SPRAY | Freq: Four times a day (QID) | RESPIRATORY_TRACT | Status: DC
  Administered 2020-04-07 – 2020-04-10 (×11): 2 via RESPIRATORY_TRACT
  Filled 2020-04-07 (×4): qty 6.7

## 2020-04-07 MED ORDER — METHYLPREDNISOLONE SODIUM SUCC 40 MG IJ SOLR
0.5000 mg/kg | Freq: Two times a day (BID) | INTRAMUSCULAR | Status: DC
  Administered 2020-04-07 – 2020-04-10 (×6): 38.4 mg via INTRAVENOUS
  Filled 2020-04-07 (×6): qty 1

## 2020-04-07 MED ORDER — DIPHENHYDRAMINE HCL 50 MG/ML IJ SOLN
50.0000 mg | Freq: Once | INTRAMUSCULAR | Status: AC | PRN
  Administered 2020-04-07: 50 mg via INTRAVENOUS
  Filled 2020-04-07: qty 1

## 2020-04-07 MED ORDER — LACTATED RINGERS IV BOLUS (SEPSIS)
500.0000 mL | Freq: Once | INTRAVENOUS | Status: AC
  Administered 2020-04-07: 500 mL via INTRAVENOUS

## 2020-04-07 MED ORDER — VITAMIN B-12 100 MCG PO TABS
100.0000 ug | ORAL_TABLET | Freq: Every day | ORAL | Status: DC
  Administered 2020-04-07 – 2020-04-10 (×4): 100 ug via ORAL
  Filled 2020-04-07 (×4): qty 1

## 2020-04-07 MED ORDER — ACETAMINOPHEN 325 MG PO TABS
650.0000 mg | ORAL_TABLET | Freq: Four times a day (QID) | ORAL | Status: DC | PRN
  Filled 2020-04-07: qty 2

## 2020-04-07 MED ORDER — SODIUM CHLORIDE 0.9 % IV SOLN
500.0000 mg | INTRAVENOUS | Status: DC
  Administered 2020-04-07 – 2020-04-09 (×3): 500 mg via INTRAVENOUS
  Filled 2020-04-07 (×4): qty 500

## 2020-04-07 MED ORDER — EPINEPHRINE 0.3 MG/0.3ML IJ SOAJ
0.3000 mg | Freq: Once | INTRAMUSCULAR | Status: DC | PRN
  Filled 2020-04-07: qty 0.6

## 2020-04-07 MED ORDER — SODIUM CHLORIDE 0.9 % IV SOLN
100.0000 mg | Freq: Every day | INTRAVENOUS | Status: DC
  Administered 2020-04-08 – 2020-04-10 (×3): 100 mg via INTRAVENOUS
  Filled 2020-04-07 (×3): qty 20

## 2020-04-07 MED ORDER — ALBUTEROL SULFATE HFA 108 (90 BASE) MCG/ACT IN AERS: 2.0000 | INHALATION_SPRAY | Freq: Once | RESPIRATORY_TRACT | Status: DC | PRN

## 2020-04-07 MED ORDER — FOLIC ACID 1 MG PO TABS
1.0000 mg | ORAL_TABLET | Freq: Every day | ORAL | Status: DC
  Administered 2020-04-07 – 2020-04-10 (×4): 1 mg via ORAL
  Filled 2020-04-07 (×4): qty 1

## 2020-04-07 MED ORDER — INSULIN ASPART 100 UNIT/ML ~~LOC~~ SOLN
0.0000 [IU] | Freq: Three times a day (TID) | SUBCUTANEOUS | Status: DC
  Administered 2020-04-07: 7 [IU] via SUBCUTANEOUS
  Administered 2020-04-08: 11 [IU] via SUBCUTANEOUS
  Administered 2020-04-08: 4 [IU] via SUBCUTANEOUS
  Administered 2020-04-08: 3 [IU] via SUBCUTANEOUS
  Administered 2020-04-09: 7 [IU] via SUBCUTANEOUS
  Administered 2020-04-10: 4 [IU] via SUBCUTANEOUS
  Filled 2020-04-07: qty 0.2

## 2020-04-07 MED ORDER — ACETAMINOPHEN 500 MG PO TABS
1000.0000 mg | ORAL_TABLET | Freq: Once | ORAL | Status: AC
  Administered 2020-04-07: 1000 mg via ORAL
  Filled 2020-04-07: qty 2

## 2020-04-07 MED ORDER — FAMOTIDINE IN NACL 20-0.9 MG/50ML-% IV SOLN: 20.0000 mg | Freq: Once | INTRAVENOUS | Status: DC | PRN

## 2020-04-07 NOTE — ED Provider Notes (Signed)
[facility] DEPT Provider Note   CSN: [id number] Arrival date & time: 04/06/20  2358     History Chief Complaint  Patient presents with  . Weakness  . COVID+    tested at [place] is a 79 y.o. male.  HPI     This is a 79 year old male with a history of chronic large granular lymphocytic leukemia, hypertension, diabetes who presents with generalized weakness.  Patient reports that he tested positive for COVID-19 on 8/27.  He began having symptoms on 8/23.  He states he has had cough which at times makes him feel like he may throw up.  He reports generalized fatigue and shortness of breath.  He notes fevers at home from 99-1 04 daily.  He is immunosuppressed with ongoing cancer treatment.  He did receive 2 doses of COVID-19 vaccination and was fully vaccinated.  Past Medical History:  Diagnosis Date  . Acute respiratory failure ([place])   . BCC (basal cell carcinoma of skin) 08/15/2016   w/SCC Left Forehead (treatment @ [facility])  . BPH (benign prostatic hyperplasia)   . Carpal tunnel syndrome on both sides   . Chronic diarrhea    intermittant due to chronic collagenous colitis  . Chronic large granular lymphocytic leukemia ([place]) primary hemotologist-  [name]. 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 Collagenous colitis    chronic--- intermittant between diarrahea and constipation  . ED (erectile dysfunction)   . HCAP (healthcare-associated pneumonia)   . History of adenomatous polyp of colon   . History of Clostridium difficile colitis 05/2016  . History of pneumonia 11/08/2017   CAP -- LLL---  12-19-2017  per pt no residual symptoms  . History of SCC (squamous cell carcinoma) of skin   . History of sepsis     11-20-2017 severe sepsis due to UTI;  10/ 2017sepsis due to c-diff colitis  . Leucocytosis    chronic  . Lower urinary tract symptoms (LUTS)   . Macrocytic anemia    since 02/ 2015  . MGUS (monoclonal gammopathy of unknown significance)    hemotologist-  [name] @ [facility]  . Monoclonal paraproteinemia   . Neuropathy, peripheral   . Nodular basal cell carcinoma (BCC) 10/01/2018   Left Neck(Nodular) Pt Does Not Want Treatment  . Nodular basal cell carcinoma (BCC) 10/01/2018   Mid Forehead (treament curet and excision)  . OA (osteoarthritis)   . Prostatic stone   . Rash of face    RIGHT SIDE  . Raynaud's syndrome   . Recurrent BCC (basal cell carcinoma) 03/05/2019   positive margin  . SCC (squamous cell carcinoma) 08/10/2009   Central Forehead (Moh's Dr. Rhona Raider @ [facility])  . SCC (squamous cell carcinoma) 12/08/2011   CIS-Left Cheek (treatment Aldara @ [facility])  . SCC (squamous cell carcinoma) 07/09/2015   CIS-Left Forehead (treament @ [facility])  . Septic shock (HCC)    x 3 w/UTI  . Squamous cell carcinoma in situ (SCCIS) 12/08/2011   Left Cheek (treatment Aldara @ [facility])   . Superficial basal cell carcinoma (BCC) 10/01/2018   Left Shin-Pt does not want treatment  . Thin skin    fragile  . Transfusion-dependent anemia since 10/ 2017   last transfusion PRBCs 07-19-2017  per hemologist note dated 10-25-2017  . Type 2 diabetes mellitus treated with insulin ([place])    followed by [name] (pcp)  . Urinary hesitancy   . Wears contact lenses    LEFT EYE ONLY    Patient Active Problem List   Diagnosis Date Noted  . Pressure injury of skin 09/02/2018  . UTI (urinary tract infection) 09/01/2018  . Sepsis ([place]) 07/28/2018  . CAP (community acquired pneumonia) 07/28/2018  . Paroxysmal atrial fibrillation ([place]) 04/10/2018  . Aortic atherosclerosis ([place]) 04/08/2018  . DM type 2 (diabetes mellitus, type 2) ([place]) 02/19/2018  . Acute respiratory failure with  hypoxia ([place]) 02/18/2018  . MGUS (monoclonal gammopathy of unknown significance) 11/23/2017  . BPH (benign prostatic hyperplasia) 11/23/2017  . Left lower lobe pneumonia 11/20/2017  . Nausea vomiting and diarrhea 11/20/2017  . Leukemia not having achieved remission ([place]) 12/26/2016  . Anemia 12/26/2016  . Large granular lymphocytic leukemia ([place]) 12/26/2016  . Diabetes mellitus with complication ([place])   . Macrocytic anemia   . DOE (dyspnea on exertion)   . Anxiety   . History of GI bleed   . C. difficile colitis 05/31/2016  . Lactic acidosis   . History of bone marrow biopsy 05/27/2016  . Fever 05/27/2016  . Encephalopathy in sepsis 05/27/2016  . Chest pain 05/04/2016  . Benign fibroma of prostate 09/20/2015  . HLD (hyperlipidemia) 09/20/2015  . BP (high blood pressure) 09/20/2015  . Arthritis, degenerative 09/20/2015  . Colitis, collagenous 09/09/2015  . Absolute anemia 09/08/2014  . Adenoma of large intestine 07/13/2014  . Disturbance of skin sensation 01/09/2014  . Recurrent major depressive disorder, in full remission ([place]) 07/18/2012  . Allergic rhinitis 08/29/2011    Past Surgical History:  Procedure Laterality Date  . BONE MARROW BIOPSY Right 05-24-2016;  08-20-2014  . CARDIOVASCULAR STRESS TEST  11-26-2012   [name]  @ [place] ([place]   normal nuclear study w/ no ischemia/  normal LV function and wall motion , ef 60% (find in care everywhere,epic)  . CATARACT EXTRACTION W/ INTRAOCULAR LENS  IMPLANT, BILATERAL  2015  . CYSTOSCOPY WITH LITHOLAPAXY N/A 12/25/2017   Procedure: CYSTOSCOPY WITH LITHOLAPAXY AND FULGERATION;  Surgeon: [name], MD;  Location: [facility];  Service: Urology;  Laterality: N/A;  . INGUINAL HERNIA REPAIR Right 1980s  . LOOP RECORDER INSERTION N/A 04/10/2018   Procedure: LOOP RECORDER INSERTION;  Surgeon: [name], MD;  Location: [facility] CV LAB;  Service: Cardiovascular;  Laterality: N/A;  . PENILE  PROSTHESIS IMPLANT  12-02-2015   dr [name] @ [facility] in [place]  . SHOULDER SURGERY Left 08/2016  . TONSILLECTOMY  child  . TRANSURETHRAL RESECTION OF PROSTATE  2009   AND REPAIR RECURRENT RIGHT INGUINAL HERNIA       Family History  Problem Relation Age of Onset  . Diabetes Mother   . Heart failure Mother     Social History   Tobacco Use  . Smoking status: Former Smoker    Years: 20.00    Types: Cigarettes    Quit date: 05/04/1996    Years since quitting: 23.9  . Smokeless tobacco: Never Used  Vaping Use  . Vaping Use: Never used  Substance Use Topics  . Alcohol use: Yes    Comment: 1-2 glasses of wine every other day  . Drug use: No    Home Medications Prior to Admission medications   Medication Sig Start Date End Date Taking? Authorizing  Provider  b complex vitamins capsule Take 1 capsule by mouth daily.   Yes [provider]  budesonide (ENTOCORT EC) 3 MG 24 hr capsule Take 6 mg by mouth daily.   Yes [provider]  Cranberry 400 MG TABS Take 1 tablet by mouth daily.    Yes [provider]  D-Mannose 500 MG CAPS Take 500 mg by mouth daily.   Yes [provider]  folic acid (FOLVITE) 1 MG tablet Take 1 mg by mouth daily.   Yes [provider]  Insulin Glargine-Lixisenatide (SOLIQUA) 100-33 UNT-MCG/ML SOPN Inject 28 Units into the skin at bedtime.    Yes [provider]  NOVOLOG FLEXPEN 100 UNIT/ML FlexPen Inject 4 Units into the skin in the morning and at bedtime.  02/21/19  Yes [provider]  pioglitazone (ACTOS) 15 MG tablet Take 15 mg by mouth every morning.  10/18/17  Yes [provider]  vitamin B-12 (CYANOCOBALAMIN) 100 MCG tablet Take 100 mcg by mouth daily.   Yes [provider]  acetaminophen (TYLENOL) 325 MG tablet Take 2 tablets (650 mg total) by mouth every 6 (six) hours as needed for mild pain (or Fever >/= 101). 11/24/17   Elgergawy, Silver Huguenin, MD  gabapentin  (NEURONTIN) 100 MG capsule TAKE 1 CAPSULE BY MOUTH THREE TIMES A DAY Patient not taking: Reported on 09/16/2019 04/15/19   Kathrynn Ducking, MD    Allergies    Bee venom, Metformin and related, and Sulfamethoxazole-trimethoprim  Review of Systems   Review of Systems  Constitutional: Positive for fatigue and fever.  Respiratory: Positive for cough and shortness of breath.   Cardiovascular: Negative for chest pain.  Gastrointestinal: Positive for nausea. Negative for abdominal pain and vomiting.  Genitourinary: Negative for dysuria.  All other systems reviewed and are negative.   Physical Exam Updated Vital Signs BP (!) 118/56 (BP Location: Left Arm)   Pulse 85   Temp 98.8 F (37.1 C) (Oral)   Resp (!) 25   SpO2 94%   Physical Exam Vitals and nursing note reviewed.  Constitutional:      Appearance: He is well-developed.     Comments: Elderly, chronically ill-appearing, no acute distress  HENT:     Head: Normocephalic and atraumatic.     Nose: Nose normal.     Mouth/Throat:     Mouth: Mucous membranes are moist.  Eyes:     Pupils: Pupils are equal, round, and reactive to light.  Cardiovascular:     Rate and Rhythm: Normal rate and regular rhythm.     Heart sounds: Normal heart sounds. No murmur heard.   Pulmonary:     Effort: Pulmonary effort is normal. No respiratory distress.     Breath sounds: Rhonchi present. No wheezing.     Comments: Occasional cough Abdominal:     General: Bowel sounds are normal.     Palpations: Abdomen is soft.     Tenderness: There is no abdominal tenderness. There is no rebound.  Musculoskeletal:     Cervical back: Neck supple.     Right lower leg: No edema.     Left lower leg: No edema.  Lymphadenopathy:     Cervical: No cervical adenopathy.  Skin:    General: Skin is warm and dry.  Neurological:     Mental Status: He is alert and oriented to person, place, and time.  Psychiatric:        Mood and Affect: Mood normal.     ED  Results / Procedures / Treatments   Labs (all labs ordered are listed, but only abnormal results are displayed) Labs Reviewed  SARS CORONAVIRUS 2 BY RT PCR ([facility]) - Abnormal; Notable for the following components:      Result Value   SARS Coronavirus 2 POSITIVE (*)    All other components within normal limits  CBC WITH DIFFERENTIAL/PLATELET - Abnormal; Notable for the following components:   WBC 12.8 (*)    RBC 1.99 (*)    Hemoglobin 8.2 (*)    HCT 25.4 (*)    MCV 127.6 (*)    MCH 41.2 (*)    RDW 18.9 (*)    Lymphs Abs 9.0 (*)    Monocytes Absolute 1.1 (*)    All other components within normal limits  COMPREHENSIVE METABOLIC PANEL - Abnormal; Notable for the following components:   Potassium 3.4 (*)    Glucose, Bld 133 (*)    Creatinine, Ser 0.54 (*)    Calcium 8.6 (*)    Total Protein 6.4 (*)    ALT 46 (*)    All other components within normal limits  FERRITIN - Abnormal; Notable for the following components:   Ferritin 1,905 (*)    All other components within normal limits  C-REACTIVE PROTEIN - Abnormal; Notable for the following components:   CRP 5.7 (*)    All other components within normal limits  CULTURE, BLOOD (ROUTINE X 2)  CULTURE, BLOOD (ROUTINE X 2)  LACTIC ACID, PLASMA  D-DIMER, QUANTITATIVE (NOT AT [facility])  PROCALCITONIN  LACTATE DEHYDROGENASE  TRIGLYCERIDES  FIBRINOGEN  LACTIC ACID, PLASMA    EKG EKG Interpretation  Date/Time:  Wednesday April 07 2020 00:19:08 EDT Ventricular Rate:  87 PR Interval:    QRS Duration: 101 QT Interval:  381 QTC Calculation: 459 R Axis:   -23 Text Interpretation: Sinus rhythm Multiple ventricular premature complexes Borderline left axis deviation Abnormal R-wave progression, late transition Confirmed by Thayer Jew [phone]) on 04/07/2020 12:59:53 AM   Radiology DG Chest Port 1 View  Result Date: 04/07/2020 CLINICAL DATA:  COVID-19 positive EXAM: PORTABLE CHEST 1  VIEW COMPARISON:  09/01/2018 FINDINGS: Single frontal view of the chest demonstrates persistent enlargement of the cardiac silhouette. Ectasia of the thoracic aorta and atherosclerosis of the aortic arch unchanged. Since the previous exam, there is increased central vascular and interstitial prominence. Patchy consolidation seen at the left lung base. No large effusion or pneumothorax. No acute bony abnormality. IMPRESSION: 1. Central vascular congestion, with diffuse increased interstitial prominence and patchy left basilar consolidation. Appearance is nonspecific and could reflect developing edema or infection. Electronically Signed   By: Randa Ngo M.D.   On: 04/07/2020 00:55    Procedures Procedures (including critical care time)  Medications Ordered in ED Medications  0.9 %  sodium chloride infusion (has no administration in time range)  diphenhydrAMINE (BENADRYL) injection 50 mg (has no administration in time range)  famotidine (PEPCID) IVPB 20 mg premix (has no administration in time range)  methylPREDNISolone sodium succinate (SOLU-MEDROL) 125 mg/2 mL injection 125 mg (has no administration in time range)  albuterol (VENTOLIN HFA) 108 (90 Base) MCG/ACT inhaler 2 puff (has no administration in time range)  EPINEPHrine (EPI-PEN) injection 0.3 mg (has no administration in time range)  casirivimab-imdevimab (REGEN-COV) 1,200 mg in sodium chloride 0.9 % 110 mL IVPB (1,200 mg Intravenous New Bag/Given 04/07/20 0357)    ED Course  I have reviewed the triage vital signs and the  nursing notes.  Pertinent labs & imaging results that were available during my care of the patient were reviewed by me and considered in my medical decision making (see chart for details).  Clinical Course as of Apr 08 543  Wed Apr 07, 2020  0903 Patient confirmed Covid positive.  Largely reassuring lab work-up.  Hemoglobin is stable.  Chest x-ray confirms COVID-19 pneumonia.  He is not hypoxic however with O2 sats  94 to 97%.  Given his age and immunosuppression, he is a candidate for monoclonal antibody infusion prior to discharge.  Discussed this with the patient and he is agreeable to this treatment.   [CH]    Clinical Course User Index [CH] Alcario Tinkey, Barbette Hair, MD   MDM Rules/Calculators/A&P                           Patient presents with ongoing symptoms of COVID-19.  He is immunosuppressed and has a history of leukemia.  Also has a history of anemia.  He is overall nontoxic on exam and vital signs are largely reassuring.  He is afebrile here.  O2 sats 94 to 97% on room air.  He is in no respiratory distress.  Covid test confirmed.  CBC shows stable leukocytosis and hemoglobin of 8.2 which is similar when compared to prior.  No indication for transfusion at this time.  Chest x-ray reviewed by myself and shows diffuse increased interstitial prominence and patchy left basilar consolidation.  This is likely reflective of known COVID-19 diagnosis.  He does not otherwise appear volume overloaded.  EKG without acute ischemic changes.  Other lab work-up is largely reassuring.  He has remained hemodynamically stable and O2 sats have remained above 94%.  I discussed with him that he was a candidate for Regeneron.  He has agreed to this treatment.  He was observed for 1 hour post treatment without ill effects.  He will be discharged home with close primary care follow-up.  He was given return precautions.  After history, exam, and medical workup I feel the patient has been appropriately medically screened and is safe for discharge home. Pertinent diagnoses were discussed with the patient. Patient was given return precautions.   Final Clinical Impression(s) / ED Diagnoses Final diagnoses:  [id number]    Rx / DC Orders ED Discharge Orders    None       Maleiya Pergola, Barbette Hair, MD 04/07/20 [phone]

## 2020-04-07 NOTE — ED Notes (Addendum)
Upon entering pt's room, pt was satting 85% on room air. Pt had dyspnea and was very uncomfortable. Pt placed on 2L [place] and on cardiac monitoring. Sats improved to 96%. Pt reports improvement in comfort level. Pt is very confused, stating he received COVID vaccine 9/25. Provided update to pt's daughter. Let daughter know that I will discuss pt's status - pt may not be d/c.

## 2020-04-07 NOTE — H&P (Addendum)
History and Physical        Hospital Admission Note Date: 04/07/2020  Patient name: Tommy Peterson Medical record number: [id number] Date of birth: [birth year] Age: 79 y.o. Gender: male  [facility]: [facility]  Patient coming from: home Lives with: girlfriend At baseline, ambulates: independently  Chief Complaint    Chief Complaint  Patient presents with  . Weakness  . COVID+    tested at [place]      HPI:   This is a 79 year old male with past medical history of chronic large granular lymphocytic leukemia (follows with Dr. Irene Limbo), collagenous colitis/chronic diarrhea on budesonide, hypertension, diabetes who has been vaccinated against COVID-19 however tested positive for COVID-19 on 8/27, began having symptoms on 8/23 and who presented with generalized weakness.  Has been having fevers at home from 99-104F.  Admits to cough and nausea.  He states that he has had a positive COVID-19 contact and his girlfriend who was not vaccinated and is currently hospitalized as well.  ED Course: He was initially noted to be afebrile and hemodynamically stable on room air and so he was given Regeneron in the ED and was about to be discharged however he became febrile up to 102.80F, tachycardic and acutely hypoxic with SPO2 down to 82% which recovered with 2 L nasal cannula and so hospital service was consulted.  Notable labs: K3.4, ferritin 1905, WBC 12.8 (11.7 on 8/18), Hb 8.2 (baseline about 12 June and has since decreased), procalcitonin 0.27.  CXR: Central vascular congestion with diffuse interstitial prominence and patchy left basilar consolidation concerning for edema or infection.  Vitals:   04/07/20 1445 04/07/20 1500  BP:  (!) 112/53  Pulse:  90  Resp: (!) 23 (!) 23  Temp:    SpO2:  96%     Review of Systems:  Review of Systems  Constitutional: Positive for chills, fever  and malaise/fatigue.  Respiratory: Positive for cough and shortness of breath. Negative for wheezing.   Cardiovascular: Negative for chest pain and leg swelling.  Gastrointestinal: Positive for nausea. Negative for vomiting.  Genitourinary: Negative for dysuria and urgency.  Musculoskeletal: Negative for falls and myalgias.  Neurological: Positive for weakness. Negative for focal weakness.  All other systems reviewed and are negative.   Medical/Social/Family History   Past Medical History: Past Medical History:  Diagnosis Date  . Acute respiratory failure ([place])   . BCC (basal cell carcinoma of skin) 08/15/2016   w/SCC Left Forehead (treatment @ [facility])  . BPH (benign prostatic hyperplasia)   . Carpal tunnel syndrome on both sides   . Chronic diarrhea    intermittant due to chronic collagenous colitis  . Chronic large granular lymphocytic leukemia ([place]) primary hemotologist-  [name]. 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 Collagenous colitis    chronic--- intermittant between diarrahea and constipation  . ED (erectile dysfunction)   . HCAP (healthcare-associated pneumonia)   . History of adenomatous polyp of colon   . History of Clostridium difficile colitis 05/2016  .  History of pneumonia 11/08/2017   CAP -- LLL---  12-19-2017  per pt no residual symptoms  . History of SCC (squamous cell carcinoma) of skin   . History of sepsis    11-20-2017 severe sepsis due to UTI;  10/ 2017sepsis due to c-diff colitis  . Leucocytosis    chronic  . Lower urinary tract symptoms (LUTS)   . Macrocytic anemia    since 02/ 2015  . MGUS (monoclonal gammopathy of unknown significance)    hemotologist-  [name] @ [facility]  . Monoclonal paraproteinemia   . Neuropathy,  peripheral   . Nodular basal cell carcinoma (BCC) 10/01/2018   Left Neck(Nodular) Pt Does Not Want Treatment  . Nodular basal cell carcinoma (BCC) 10/01/2018   Mid Forehead (treament curet and excision)  . OA (osteoarthritis)   . Prostatic stone   . Rash of face    RIGHT SIDE  . Raynaud's syndrome   . Recurrent BCC (basal cell carcinoma) 03/05/2019   positive margin  . SCC (squamous cell carcinoma) 08/10/2009   Central Forehead (Moh's Dr. Rhona Raider @ [facility])  . SCC (squamous cell carcinoma) 12/08/2011   CIS-Left Cheek (treatment Aldara @ [facility])  . SCC (squamous cell carcinoma) 07/09/2015   CIS-Left Forehead (treament @ [facility])  . Septic shock (HCC)    x 3 w/UTI  . Squamous cell carcinoma in situ (SCCIS) 12/08/2011   Left Cheek (treatment Aldara @ [facility])   . Superficial basal cell carcinoma (BCC) 10/01/2018   Left Shin-Pt does not want treatment  . Thin skin    fragile  . Transfusion-dependent anemia since 10/ 2017   last transfusion PRBCs 07-19-2017  per hemologist note dated 10-25-2017  . Type 2 diabetes mellitus treated with insulin ([place])    followed by [name] (pcp)  . Urinary hesitancy   . Wears contact lenses    LEFT EYE ONLY    Past Surgical History:  Procedure Laterality Date  . BONE MARROW BIOPSY Right 05-24-2016;  08-20-2014  . CARDIOVASCULAR STRESS TEST  11-26-2012   [name]  @ [place] ([place]   normal nuclear study w/ no ischemia/  normal LV function and wall motion , ef 60% (find in care everywhere,epic)  . CATARACT EXTRACTION W/ INTRAOCULAR LENS  IMPLANT, BILATERAL  2015  . CYSTOSCOPY WITH LITHOLAPAXY N/A 12/25/2017   Procedure: CYSTOSCOPY WITH LITHOLAPAXY AND FULGERATION;  Surgeon: [name], MD;  [facility];  Service: Urology;  Laterality: N/A;  . INGUINAL HERNIA REPAIR Right 1980s  . LOOP RECORDER INSERTION N/A 04/10/2018   Procedure: LOOP RECORDER INSERTION;  Surgeon: [name], MD;   Location: [place] [facility];  Service: Cardiovascular;  Laterality: N/A;  . PENILE PROSTHESIS IMPLANT  12-02-2015   dr [name] @ [facility] in [place]  . SHOULDER SURGERY Left 08/2016  . TONSILLECTOMY  child  . TRANSURETHRAL RESECTION OF PROSTATE  2009   AND REPAIR RECURRENT RIGHT INGUINAL HERNIA    Medications: Prior to Admission medications   Medication Sig Start Date End Date Taking? Authorizing Provider  b complex vitamins capsule Take 1 capsule by mouth daily.   Yes [provider]  budesonide (ENTOCORT EC) 3 MG 24 hr capsule Take 6 mg by mouth daily.   Yes [provider]  Cranberry 400 MG TABS Take 1 tablet by mouth daily.    Yes [provider]  D-Mannose 500 MG CAPS Take 500 mg by mouth daily.  Yes [provider]  folic acid (FOLVITE) 1 MG tablet Take 1 mg by mouth daily.   Yes [provider]  Insulin Glargine-Lixisenatide (SOLIQUA) 100-33 UNT-MCG/ML SOPN Inject 28 Units into the skin at bedtime.    Yes [provider]  NOVOLOG FLEXPEN 100 UNIT/ML FlexPen Inject 4 Units into the skin in the morning and at bedtime.  02/21/19  Yes [provider]  pioglitazone (ACTOS) 15 MG tablet Take 15 mg by mouth every morning.  10/18/17  Yes [provider]  vitamin B-12 (CYANOCOBALAMIN) 100 MCG tablet Take 100 mcg by mouth daily.   Yes [provider]  acetaminophen (TYLENOL) 325 MG tablet Take 2 tablets (650 mg total) by mouth every 6 (six) hours as needed for mild pain (or Fever >/= 101). 11/24/17   Elgergawy, Silver Huguenin, MD  gabapentin (NEURONTIN) 100 MG capsule TAKE 1 CAPSULE BY MOUTH THREE TIMES A DAY Patient not taking: Reported on 09/16/2019 04/15/19   Kathrynn Ducking, MD    Allergies:   Allergies  Allergen Reactions  . Bee Venom Swelling and Anaphylaxis  . Metformin And Related Nausea And Vomiting  . Sulfamethoxazole-Trimethoprim Nausea And Vomiting    Social History:  reports that he  quit smoking about 23 years ago. His smoking use included cigarettes. He quit after 20.00 years of use. He has never used smokeless tobacco. He reports current alcohol use. He reports that he does not use drugs.  Family History: Family History  Problem Relation Age of Onset  . Diabetes Mother   . Heart failure Mother      Objective   Physical Exam: Blood pressure (!) 112/53, pulse 90, temperature 98.6 F (37 C), temperature source Oral, resp. rate (!) 23, SpO2 96 %.  Physical Exam Vitals and nursing note reviewed.  Constitutional:      Appearance: Normal appearance.  HENT:     Head: Normocephalic and atraumatic.  Eyes:     Conjunctiva/sclera: Conjunctivae normal.  Cardiovascular:     Rate and Rhythm: Normal rate.     Pulses: Normal pulses.  Pulmonary:     Effort: Pulmonary effort is normal. No respiratory distress.  Abdominal:     General: Abdomen is flat.     Palpations: Abdomen is soft.  Musculoskeletal:        General: No swelling or tenderness.  Skin:    Coloration: Skin is not jaundiced or pale.  Neurological:     Mental Status: He is alert. Mental status is at baseline.  Psychiatric:        Mood and Affect: Mood normal.        Behavior: Behavior normal.     LABS on Admission: I have personally reviewed all the labs and imaging below    Basic Metabolic Panel: Recent Labs  Lab 04/07/20 0055 04/07/20 1328  NA 135  --   K 3.4*  --   CL 101  --   CO2 23  --   GLUCOSE 133*  --   BUN 15  --   CREATININE 0.54* 0.50*  CALCIUM 8.6*  --    Liver Function Tests: Recent Labs  Lab 04/07/20 0055  AST 37  ALT 46*  ALKPHOS 55  BILITOT 1.0  PROT 6.4*  ALBUMIN 3.5   No results for input(s): LIPASE, AMYLASE in the last 168 hours. No results for input(s): AMMONIA in the last 168 hours. CBC: Recent Labs  Lab 04/07/20 0055 04/07/20 1328  WBC 12.8*  --   NEUTROABS  2.7  --   HGB 8.2* 8.2*  HCT 25.4* 25.5*  MCV 127.6*  --   PLT 231  --    Cardiac  Enzymes: No results for input(s): CKTOTAL, CKMB, CKMBINDEX, TROPONINI in the last 168 hours. BNP: Invalid input(s): POCBNP CBG: No results for input(s): GLUCAP in the last 168 hours.  Radiological Exams on Admission:  DG Chest Port 1 View  Result Date: 04/07/2020 CLINICAL DATA:  COVID-19 positive EXAM: PORTABLE CHEST 1 VIEW COMPARISON:  09/01/2018 FINDINGS: Single frontal view of the chest demonstrates persistent enlargement of the cardiac silhouette. Ectasia of the thoracic aorta and atherosclerosis of the aortic arch unchanged. Since the previous exam, there is increased central vascular and interstitial prominence. Patchy consolidation seen at the left lung base. No large effusion or pneumothorax. No acute bony abnormality. IMPRESSION: 1. Central vascular congestion, with diffuse increased interstitial prominence and patchy left basilar consolidation. Appearance is nonspecific and could reflect developing edema or infection. Electronically Signed   By: Randa Ngo M.D.   On: 04/07/2020 00:55      EKG: Independently reviewed.  PVCs and PACs but otherwise similar to baseline   A & P   Principal Problem:   Acute hypoxemic respiratory failure due to COVID-19 [facility]) Active Problems:   Anemia   Large granular lymphocytic leukemia ([place])   Sepsis ([place])   1. Sepsis without septic shock  Acute hypoxic respiratory failure secondary to COVID-19 and CAP in immunosuppressed patient a. SIRS criteria: Fever, tachycardia, tachypnea, leukocytosis (baseline 11.7, currently 12.8), hypoxia, COVID-19 positive b. Has chronic lymphocytic leukemia as below c. CXR concerning for pneumonia  d. elevated procalcitonin (0.27) e. Received monoclonal antibodies x1 in the ED prior to decompensation (as above) f. COVID 19 Treatment: Remdesivir, Solumedrol, Baricitinib g. CAP treatment: Ceftriaxone, Azithromycin h. Lactated ringers per sepsis protocol, monitor for volume overload i. Trend inflammatory  markers j. Follow up blood cultures k. MRSA nares screen  2. Chronic large granular lymphocytic leukemia a. Patient of Dr. Irene Limbo  b. Will notify oncology that the patient is hospitalized  3. Acute on Chronic Macrocytic Anemia a. Continue home vitamin B12 b. Follow up in AM  4. Type 2 diabetes a. Hold home meds b. Levemir and sliding scale per COVID 19/steroid protocol c. Will check HbA1c  5. Collagenous colitis/chronic diarrhea a. Hold home budesonide as he is getting solumedrol   DVT prophylaxis: lovenox   Code Status: Full Code  Diet: heart healthy/carb control Family Communication: Admission, patients condition and plan of care including tests being ordered have been discussed with the patient who indicates understanding and agrees with the plan and Code Status.  Disposition Plan: The appropriate patient status for this patient is INPATIENT. Inpatient status is judged to be reasonable and necessary in order to provide the required intensity of service to ensure the patient's safety. The patient's presenting symptoms, physical exam findings, and initial radiographic and laboratory data in the context of their chronic comorbidities is felt to place them at high risk for further clinical deterioration. Furthermore, it is not anticipated that the patient will be medically stable for discharge from the hospital within 2 midnights of admission. The following factors support the patient status of inpatient.   " The patient's presenting symptoms include fever. " The worrisome physical exam findings include fever, shortness of breath. " The initial radiographic and laboratory data are worrisome because of consolidation on CXR, COVID 19 positive, tachycardia, tachypnea. " The chronic co-morbidities include chronic large granular lymphocytic leukemia.   *  I certify that at the point of admission it is my clinical judgment that the patient will require inpatient hospital care spanning beyond 2  midnights from the point of admission due to high intensity of service, high risk for further deterioration and high frequency of surveillance required.*   Status is: Inpatient  Remains inpatient appropriate because:IV treatments appropriate due to intensity of illness or inability to take PO and Inpatient level of care appropriate due to severity of illness   Dispo: The patient is from: Home              Anticipated d/c is to: Home              Anticipated d/c date is: 3 days              Patient currently is not medically stable to d/c.     Consultants  . Oncology notified   Procedures  . none  Time Spent on Admission: 65 minutes    Harold Hedge, DO [facility] Pager [phone] 04/07/2020, 4:48 PM

## 2020-04-07 NOTE — ED Notes (Signed)
Date and time results received: 04/07/20 3:17 AM  (use smartphrase ".now" to insert current time)  Test: COVID Critical Value: Positive  Name of Provider Notified: Dr.Horton  Orders Received? Or Actions Taken?:

## 2020-04-07 NOTE — ED Notes (Signed)
Left voicemail for Tommy Peterson, pt requests his iphone charger be brought up.

## 2020-04-07 NOTE — ED Notes (Signed)
Albuterol inhaler at bedside

## 2020-04-07 NOTE — ED Triage Notes (Signed)
Patient arrives via EMS from home with complaints of continuing weakness since being diagnosed with COVID on 8/27 through the [place]. Patient states he doesn't feel worse, just isn't getting better. Per family, patient is much weaker than he has been. Patient had to be assisted off of the floor by his daughter today after he lost his balance attempting to get dressed. Patient is alert and oriented.

## 2020-04-07 NOTE — ED Provider Notes (Signed)
Was asked by nursing staff to see the patient who was pending discharge.  Positive for coronavirus.  79 years old.  Got Regeneron infusion.  Had been with normal oxygen throughout ED stay but on their evaluation patient hypoxic in the 80s mildly confused.  Fever back to 102.9.  Patient tachypneic.  Placed on 2 L of oxygen with improvement of sats.  On my evaluation patient is mildly confused can tell me his name but does not appear that he can tell me if he has been vaccinated or not.  He is tachypneic.  But no severe respiratory distress.  Lab work was overall unremarkable except for elevated ferritin.  Chest x-ray looked consistent with Covid pneumonia.  He had received steroids.  Will give IV remdesivir and admit to the hospitalist given hypoxia.  Family is aware as nursing staff talked with them.  According to previous doctor's note patient did receive vaccination in the past.  He has coarse breath sounds on exam.  Will admit to hospitalist for further care.  .Critical Care Performed by: Lennice Sites, DO Authorized by: Lennice Sites, DO   Critical care provider statement:    Critical care time (minutes):  35   Critical care was necessary to treat or prevent imminent or life-threatening deterioration of the following conditions:  Respiratory failure   Critical care was time spent personally by me on the following activities:  Blood draw for specimens, development of treatment plan with patient or surrogate, discussions with primary provider, evaluation of patient's response to treatment, examination of patient, obtaining history from patient or surrogate, ordering and performing treatments and interventions, ordering and review of laboratory studies, ordering and review of radiographic studies, pulse oximetry, re-evaluation of patient's condition and review of old charts   I assumed direction of critical care for this patient from another provider in my specialty: no      Tommy Peterson was  evaluated in Emergency Department on 04/07/2020 for the symptoms described in the history of present illness. He was evaluated in the context of the global COVID-19 pandemic, which necessitated consideration that the patient might be at risk for infection with the SARS-CoV-2 virus that causes COVID-19. Institutional protocols and algorithms that pertain to the evaluation of patients at risk for COVID-19 are in a state of rapid change based on information released by regulatory bodies including the CDC and federal and state organizations. These policies and algorithms were followed during the patient's care in the ED.    Lennice Sites, DO 04/07/20 [phone]

## 2020-04-07 NOTE — Discharge Instructions (Addendum)
  Patient scheduled for outpatient Remdesivir infusions at 10 AM on Sunday 9/5 at [facility]. Please inform the patient to park at [place], as staff will be escorting the patient through the Wing entrance of the hospital.    There is a wave flag banner located near the entrance on [address]. Turn into this entrance and immediately turn left and park in 1 of the 5 designated Covid Infusion Parking spots. There is a phone number on the sign, please call and let the staff know what spot you are in and we will come out and get you. For questions call [phone].  Thanks.  * If patient is getting dropped off, you may have your ride pull up to the Main Entrance of [facility]. Please stay in the car and call [phone], staff will meet you at your car and escort you into the hospital and back to the clinic.    You were seen today for ongoing symptoms of COVID-19.  You received Regeneron in the ED.  Otherwise your work-up is largely reassuring.  You are not hypoxic.  Continue to monitor your symptoms closely.  Obtain a portable pulse ox and if your O2 sats drop below 92%, you should be reevaluated.

## 2020-04-07 NOTE — ED Notes (Signed)
Changed pt's linen, as it was wet. Removed pt's underwears and pants, placed in white bag at bedside. Primofit placed on pt for urine collection.

## 2020-04-07 NOTE — ED Notes (Signed)
Pt has periods of confusion, then is very lucid, asking questions about medications and looking them up on his phone.

## 2020-04-07 NOTE — ED Notes (Signed)
Per Nicholaus Bloom, pt received 1st vaccine in June; 2nd shot July 25

## 2020-04-07 NOTE — ED Notes (Signed)
Provided [place] with update. Appreciative of info. Told her I would let her know of any major changes or when he went upstairs. She agreed to update the rest of the family.

## 2020-04-07 NOTE — ED Notes (Signed)
Patient complaining of nausea, MD made aware

## 2020-04-08 ENCOUNTER — Inpatient Hospital Stay: Payer: Medicare HMO

## 2020-04-08 LAB — CBC WITH DIFFERENTIAL/PLATELET
Abs Immature Granulocytes: 0.05 10*3/uL (ref 0.00–0.07)
Basophils Absolute: 0 10*3/uL (ref 0.0–0.1)
Basophils Relative: 0 %
Eosinophils Absolute: 0 10*3/uL (ref 0.0–0.5)
Eosinophils Relative: 0 %
HCT: 23.5 % — ABNORMAL LOW (ref 39.0–52.0)
Hemoglobin: 7.7 g/dL — ABNORMAL LOW (ref 13.0–17.0)
Immature Granulocytes: 1 %
Lymphocytes Relative: 55 %
Lymphs Abs: 3.3 10*3/uL (ref 0.7–4.0)
MCH: 42.1 pg — ABNORMAL HIGH (ref 26.0–34.0)
MCHC: 32.8 g/dL (ref 30.0–36.0)
MCV: 128.4 fL — ABNORMAL HIGH (ref 80.0–100.0)
Monocytes Absolute: 0.3 10*3/uL (ref 0.1–1.0)
Monocytes Relative: 5 %
Neutro Abs: 2.3 10*3/uL (ref 1.7–7.7)
Neutrophils Relative %: 39 %
Platelets: 215 10*3/uL (ref 150–400)
RBC: 1.83 MIL/uL — ABNORMAL LOW (ref 4.22–5.81)
RDW: 18.7 % — ABNORMAL HIGH (ref 11.5–15.5)
WBC: 5.9 10*3/uL (ref 4.0–10.5)
nRBC: 0 % (ref 0.0–0.2)

## 2020-04-08 LAB — COMPREHENSIVE METABOLIC PANEL
ALT: 47 U/L — ABNORMAL HIGH (ref 0–44)
AST: 34 U/L (ref 15–41)
Albumin: 3.1 g/dL — ABNORMAL LOW (ref 3.5–5.0)
Alkaline Phosphatase: 52 U/L (ref 38–126)
Anion gap: 8 (ref 5–15)
BUN: 11 mg/dL (ref 8–23)
CO2: 26 mmol/L (ref 22–32)
Calcium: 8.4 mg/dL — ABNORMAL LOW (ref 8.9–10.3)
Chloride: 106 mmol/L (ref 98–111)
Creatinine, Ser: 0.35 mg/dL — ABNORMAL LOW (ref 0.61–1.24)
GFR calc Af Amer: >60 mL/min (ref >60)
GFR calc non Af Amer: >60 mL/min (ref >60)
Glucose, Bld: 147 mg/dL — ABNORMAL HIGH (ref 70–99)
Potassium: 4 mmol/L (ref 3.5–5.1)
Sodium: 140 mmol/L (ref 135–145)
Total Bilirubin: 0.5 mg/dL (ref 0.3–1.2)
Total Protein: 5.8 g/dL — ABNORMAL LOW (ref 6.5–8.1)

## 2020-04-08 LAB — PREPARE RBC (CROSSMATCH)

## 2020-04-08 LAB — GLUCOSE, CAPILLARY: Glucose-Capillary: 172 mg/dL — ABNORMAL HIGH (ref 70–99)

## 2020-04-08 LAB — CBG MONITORING, ED
Glucose-Capillary: 138 mg/dL — ABNORMAL HIGH (ref 70–99)
Glucose-Capillary: 158 mg/dL — ABNORMAL HIGH (ref 70–99)
Glucose-Capillary: 272 mg/dL — ABNORMAL HIGH (ref 70–99)

## 2020-04-08 LAB — FERRITIN: Ferritin: 2122 ng/mL — ABNORMAL HIGH (ref 24–336)

## 2020-04-08 LAB — MAGNESIUM: Magnesium: 2.2 mg/dL (ref 1.7–2.4)

## 2020-04-08 LAB — C-REACTIVE PROTEIN: CRP: 7.4 mg/dL — ABNORMAL HIGH (ref <1.0)

## 2020-04-08 LAB — MRSA PCR SCREENING: MRSA by PCR: NEGATIVE

## 2020-04-08 LAB — D-DIMER, QUANTITATIVE: D-Dimer, Quant: 0.39 ug/mL-FEU (ref 0.00–0.50)

## 2020-04-08 MED ORDER — SODIUM CHLORIDE 0.9% IV SOLUTION: Freq: Once | INTRAVENOUS | Status: DC

## 2020-04-08 MED ORDER — BARICITINIB 2 MG PO TABS
4.0000 mg | ORAL_TABLET | Freq: Every day | ORAL | Status: DC
  Administered 2020-04-08 – 2020-04-10 (×3): 4 mg via ORAL
  Filled 2020-04-08 (×3): qty 2

## 2020-04-08 NOTE — Progress Notes (Signed)
PROGRESS NOTE    Tommy Peterson  NGE:[id number] DOB: 04/05/1941 DOA: 04/06/2020 PCP: [facility]    Brief Narrative:  79 year old gentleman with chronic large granular lymphocytic leukemia, chronic diarrhea on budesonide, hypertension and diabetes, vaccinated against COVID-19 started feeling weak and intermittent fever at home, tested positive for COVID-19 at [facility] on 8/27 presented with ongoing fever and extreme weakness. In the emergency room hemodynamically stable.  Initially on room air and was given a dose of Regeneron in the ER for discharge, he started having temperature 102.9 tachycardic and hypoxic to 82% on room air recovered on 2 L so hospitalized.   Assessment & Plan:   Principal Problem:   Acute hypoxemic respiratory failure due to COVID-19 [facility]) Active Problems:   Anemia   Large granular lymphocytic leukemia ([place])   Sepsis ([place])  Acute hypoxemic respiratory failure due to COVID-19 pneumonia in an immunocompromised patient: Sepsis present on admission, sepsis criteria met with SIRS criteria fever, tachycardia, tachypnea and leukocytosis along with presence of COVID-19 virus infection Continue to monitor due to significant symptoms  chest physiotherapy, incentive spirometry, deep breathing exercises, sputum induction, mucolytic's and bronchodilators. Supplemental oxygen to keep saturations more than 90%. Covid directed therapy with , steroids, on Solu-Medrol remdesivir, day 2/5 Baricitinib, day 2/14 antibiotics, started with concern of superadded bacterial infection due to immunocompromise condition.  Will monitor closely and de-escalate next 24 hours. Due to severity of symptoms, patient will need daily inflammatory markers, chest x-rays, liver function test to monitor and direct COVID-19 therapies.  COVID-19 Labs  Recent Labs    04/07/20 0055 04/08/20 0846 04/08/20 0848  DDIMER 0.44 0.39  --   FERRITIN 1,905*  --  2,122*  LDH 182  --   --    CRP 5.7*  --  7.4*    Lab Results  Component Value Date   SARSCOV2NAA POSITIVE (A) 04/07/2020   Kenn[place] Not Detected 10/20/2019   SpO2: 100 % O2 Flow Rate (L/min): 2 L/min  Leukemia: Currently stable.  Will discuss with his oncologist.  Acute on chronic macrocytic anemia: His hemoglobin 7.7.  Has longstanding history of fatigue and weakness.  Probably symptomatic anemia.  I will discuss with his oncologist and if agreed, will transfuse 1 unit of PRBC for symptomatic anemia.  Type 2 diabetes: On insulin and Actos at home.  Currently remains on escalating dose of insulin to cover for steroids.  Tradjenta.  Chronic diarrhea/collagenous colitis: On budesonide at home.  Currently on Solu-Medrol.  DVT prophylaxis: enoxaparin (LOVENOX) injection 40 mg Start: 04/07/20 1300 SCDs Start: 04/07/20 1251   Code Status: Full code Family Communication: None.  Patient stated he will communicate himself. Disposition Plan: Status is: Inpatient  Remains inpatient appropriate because:Inpatient level of care appropriate due to severity of illness   Dispo: The patient is from: Home              Anticipated d/c is to: Home              Anticipated d/c date is: 2 days              Patient currently is not medically stable to d/c.         Consultants:   None  Procedures:   None  Antimicrobials:  Anti-infectives (From admission, onward)   Start     Dose/Rate Route Frequency Ordered Stop   04/08/20 1000  remdesivir 100 mg in sodium chloride 0.9 % 100 mL IVPB       "  Followed by" Linked Group Details   100 mg 200 mL/hr over 30 Minutes Intravenous Daily 04/07/20 0857 04/12/20 0959   04/08/20 1000  remdesivir 100 mg in sodium chloride 0.9 % 100 mL IVPB  Status:  Discontinued       "Followed by" Linked Group Details   100 mg 200 mL/hr over 30 Minutes Intravenous Daily 04/07/20 1250 04/07/20 1257   04/07/20 1700  cefTRIAXone (ROCEPHIN) 2 g in sodium chloride 0.9 % 100 mL IVPB         2 g 200 mL/hr over 30 Minutes Intravenous Every 24 hours 04/07/20 1633     04/07/20 1700  azithromycin (ZITHROMAX) 500 mg in sodium chloride 0.9 % 250 mL IVPB        500 mg 250 mL/hr over 60 Minutes Intravenous Every 24 hours 04/07/20 1633     04/07/20 1250  remdesivir 200 mg in sodium chloride 0.9% 250 mL IVPB  Status:  Discontinued       "Followed by" Linked Group Details   200 mg 580 mL/hr over 30 Minutes Intravenous Once 04/07/20 1250 04/07/20 1257   04/07/20 0930  remdesivir 200 mg in sodium chloride 0.9% 250 mL IVPB       "Followed by" Linked Group Details   200 mg 580 mL/hr over 30 Minutes Intravenous Once 04/07/20 0857 04/07/20 1041         Subjective: Patient seen and examined.  Still in the emergency room waiting for inpatient bed.  No overnight events.  Feels weak and tired.  Denies any chest pain or shortness of breath at rest.  He has not ambulated yet.  Objective: Vitals:   04/08/20 0900 04/08/20 1100 04/08/20 1300 04/08/20 1400  BP: 111/62 114/83 (!) 113/58 110/60  Pulse: (!) 35 74 78 88  Resp: 20 19 (!) 23 (!) 21  Temp:      TempSrc:      SpO2: 100% 94% 100% 100%  Weight:      Height:        Intake/Output Summary (Last 24 hours) at 04/08/2020 1415 Last data filed at 04/08/2020 0658 Gross per 24 hour  Intake 2850 ml  Output 2000 ml  Net 850 ml   Filed Weights   04/07/20 1718  Weight: 72.6 kg    Examination:  General exam: Appears calm and comfortable  Chronically sick looking.  He looks pale and frail. Respiratory system: Clear to auscultation. Respiratory effort normal.  No added sounds. Cardiovascular system: S1 & S2 heard, RRR. No JVD, murmurs, rubs, gallops or clicks. No pedal edema. Gastrointestinal system: Abdomen is nondistended, soft and nontender. No organomegaly or masses felt. Normal bowel sounds heard. Central nervous system: Alert and oriented. No focal neurological deficits. Extremities: Symmetric 5 x 5 power. Skin: No rashes, lesions or  ulcers Psychiatry: Judgement and insight appear normal. Mood & affect appropriate.     Data Reviewed: I have personally reviewed following labs and imaging studies  CBC: Recent Labs  Lab 04/07/20 0055 04/07/20 1328 04/08/20 0846  WBC 12.8*  --  5.9  NEUTROABS 2.7  --  2.3  HGB 8.2* 8.2* 7.7*  HCT 25.4* 25.5* 23.5*  MCV 127.6*  --  128.4*  PLT 231  --  009   Basic Metabolic Panel: Recent Labs  Lab 04/07/20 0055 04/07/20 1328 04/08/20 0846  NA 135  --  140  K 3.4*  --  4.0  CL 101  --  106  CO2 23  --  26  GLUCOSE  133*  --  147*  BUN 15  --  11  CREATININE 0.54* 0.50* 0.35*  CALCIUM 8.6*  --  8.4*  MG  --   --  2.2   GFR: Estimated Creatinine Clearance: 76.1 mL/min (A) (by C-G formula based on SCr of 0.35 mg/dL (L)). Liver Function Tests: Recent Labs  Lab 04/07/20 0055 04/08/20 0846  AST 37 34  ALT 46* 47*  ALKPHOS 55 52  BILITOT 1.0 0.5  PROT 6.4* 5.8*  ALBUMIN 3.5 3.1*   No results for input(s): LIPASE, AMYLASE in the last 168 hours. No results for input(s): AMMONIA in the last 168 hours. Coagulation Profile: No results for input(s): INR, PROTIME in the last 168 hours. Cardiac Enzymes: No results for input(s): CKTOTAL, CKMB, CKMBINDEX, TROPONINI in the last 168 hours. BNP (last 3 results) No results for input(s): PROBNP in the last 8760 hours. HbA1C: Recent Labs    04/07/20 0055  HGBA1C 7.5*   CBG: Recent Labs  Lab 04/07/20 1654 04/07/20 2226 04/08/20 0802 04/08/20 1136  GLUCAP 208* 163* 138* 158*   Lipid Profile: Recent Labs    04/07/20 0055  TRIG 111   Thyroid Function Tests: No results for input(s): TSH, T4TOTAL, FREET4, T3FREE, THYROIDAB in the last 72 hours. Anemia Panel: Recent Labs    04/07/20 0055 04/08/20 0848  FERRITIN 1,905* 2,122*   Sepsis Labs: Recent Labs  Lab 04/07/20 0055  PROCALCITON 0.27  LATICACIDVEN 1.2    Recent Results (from the past 240 hour(s))  SARS Coronavirus 2 by RT PCR (hospital order,  performed in [facility] hospital lab) Nasopharyngeal Nasopharyngeal Swab     Status: Abnormal   Collection Time: 04/07/20 12:55 AM   Specimen: Nasopharyngeal Swab  Result Value Ref Range Status   SARS Coronavirus 2 POSITIVE (A) NEGATIVE Final    Comment: RESULT CALLED TO, READ BACK BY AND VERIFIED WITH: RN I HODGES AT 0316 04/07/20 CRUICKSHANK A (NOTE) SARS-CoV-2 target nucleic acids are DETECTED  SARS-CoV-2 RNA is generally detectable in upper respiratory specimens  during the acute phase of infection.  Positive results are indicative  of the presence of the identified virus, but do not rule out bacterial infection or co-infection with other pathogens not detected by the test.  Clinical correlation with patient history and  other diagnostic information is necessary to determine patient infection status.  The expected result is negative.  Fact Sheet for Patients:   BoilerBrush.com.cy   Fact Sheet for Healthcare Providers:   https://pope.com/    This test is not yet approved or cleared by the [place] FDA and  has been authorized for detection and/or diagnosis of SARS-CoV-2 by FDA under an Emergency Use Authorization (EUA).  This EUA will remain in effect (meaning t his test can be used) for the duration of  the COVID-19 declaration under Section 564(b)(1) of the Act, 21 U.S.C. section 360-bbb-3(b)(1), unless the authorization is terminated or revoked sooner.  Performed at [facility], [address], [place], [place] [address]   Blood Culture (routine x 2)     Status: None (Preliminary result)   Collection Time: 04/07/20 12:55 AM   Specimen: BLOOD  Result Value Ref Range Status   Specimen Description   Final    BLOOD BLOOD RIGHT FOREARM Performed at [facility], [address], [place], [place] [address]    Special Requests   Final    BOTTLES DRAWN AEROBIC AND ANAEROBIC Blood Culture  results may not be optimal due  to an inadequate volume of blood received in culture bottles Performed at [facility] [address] [place], [place] [address]    Culture   Final    NO GROWTH 1 DAY Performed at [facility], [place] [address], [place], [place] [address]    Report Status PENDING  Incomplete  Blood Culture (routine x 2)     Status: None (Preliminary result)   Collection Time: 04/07/20 12:55 AM   Specimen: BLOOD  Result Value Ref Range Status   Specimen Description   Final    BLOOD RIGHT HAND Performed at [facility] [address], [place], [place] [address]    Special Requests   Final    BOTTLES DRAWN AEROBIC AND ANAEROBIC Blood Culture results may not be optimal due to an inadequate volume of blood received in culture bottles Performed at [facility] [address] [place], [place] [address]    Culture   Final    NO GROWTH 1 DAY Performed at [facility], [place] [address], [place], [place] [address]    Report Status PENDING  Incomplete  MRSA PCR Screening     Status: None   Collection Time: 04/07/20 11:20 PM   Specimen: Nasal Mucosa; Nasopharyngeal  Result Value Ref Range Status   MRSA by PCR NEGATIVE NEGATIVE Final    Comment:        The GeneXpert MRSA Assay (FDA approved for NASAL specimens only), is one component of a comprehensive MRSA colonization surveillance program. It is not intended to diagnose MRSA infection nor to guide or monitor treatment for MRSA infections. Performed at [facility], [place] [address], [place], [place] [address]          Radiology Studies: DG Chest Port 1 View  Result Date: 04/07/2020 CLINICAL DATA:  COVID-19 positive EXAM: PORTABLE CHEST 1 VIEW COMPARISON:  09/01/2018 FINDINGS: Single frontal view of the chest demonstrates persistent enlargement of the cardiac silhouette. Ectasia of the thoracic aorta and atherosclerosis of the  aortic arch unchanged. Since the previous exam, there is increased central vascular and interstitial prominence. Patchy consolidation seen at the left lung base. No large effusion or pneumothorax. No acute bony abnormality. IMPRESSION: 1. Central vascular congestion, with diffuse increased interstitial prominence and patchy left basilar consolidation. Appearance is nonspecific and could reflect developing edema or infection. Electronically Signed   By: Randa Ngo M.D.   On: 04/07/2020 00:55        Scheduled Meds: . albuterol  2 puff Inhalation Q6H  . baricitinib  4 mg Oral Daily  . enoxaparin (LOVENOX) injection  40 mg Subcutaneous Q24H  . folic acid  1 mg Oral Daily  . insulin aspart  0-20 Units Subcutaneous TID WC  . insulin detemir  0.15 Units/kg Subcutaneous BID  . linagliptin  5 mg Oral Daily  . methylPREDNISolone (SOLU-MEDROL) injection  0.5 mg/kg Intravenous Q12H  . vitamin B-12  100 mcg Oral Daily   Continuous Infusions: . sodium chloride    . azithromycin Stopped (04/07/20 1952)  . cefTRIAXone (ROCEPHIN)  IV Stopped (04/07/20 1952)  . famotidine (PEPCID) IV    . remdesivir 100 mg in NS 100 mL 100 mg (04/08/20 1053)     LOS: 1 day    Time spent: 35 minutes    Barb Merino, MD [facility] Pager [phone]

## 2020-04-09 LAB — COMPREHENSIVE METABOLIC PANEL
ALT: 43 U/L (ref 0–44)
AST: 34 U/L (ref 15–41)
Albumin: 2.8 g/dL — ABNORMAL LOW (ref 3.5–5.0)
Alkaline Phosphatase: 47 U/L (ref 38–126)
Anion gap: 8 (ref 5–15)
BUN: 17 mg/dL (ref 8–23)
CO2: 24 mmol/L (ref 22–32)
Calcium: 8.2 mg/dL — ABNORMAL LOW (ref 8.9–10.3)
Chloride: 107 mmol/L (ref 98–111)
Creatinine, Ser: 0.44 mg/dL — ABNORMAL LOW (ref 0.61–1.24)
GFR calc Af Amer: >60 mL/min (ref >60)
GFR calc non Af Amer: >60 mL/min (ref >60)
Glucose, Bld: 149 mg/dL — ABNORMAL HIGH (ref 70–99)
Potassium: 4.4 mmol/L (ref 3.5–5.1)
Sodium: 139 mmol/L (ref 135–145)
Total Bilirubin: 0.8 mg/dL (ref 0.3–1.2)
Total Protein: 5.6 g/dL — ABNORMAL LOW (ref 6.5–8.1)

## 2020-04-09 LAB — TYPE AND SCREEN
ABO/RH(D): O POS
Antibody Screen: NEGATIVE
Unit division: 0

## 2020-04-09 LAB — BPAM RBC
Blood Product Expiration Date: 202110012359
ISSUE DATE / TIME: 202109022320
Unit Type and Rh: 5100

## 2020-04-09 LAB — FERRITIN: Ferritin: 2604 ng/mL — ABNORMAL HIGH (ref 24–336)

## 2020-04-09 LAB — GLUCOSE, CAPILLARY
Glucose-Capillary: 106 mg/dL — ABNORMAL HIGH (ref 70–99)
Glucose-Capillary: 117 mg/dL — ABNORMAL HIGH (ref 70–99)
Glucose-Capillary: 245 mg/dL — ABNORMAL HIGH (ref 70–99)
Glucose-Capillary: 284 mg/dL — ABNORMAL HIGH (ref 70–99)

## 2020-04-09 LAB — CBC WITH DIFFERENTIAL/PLATELET
Abs Immature Granulocytes: 0.08 10*3/uL — ABNORMAL HIGH (ref 0.00–0.07)
Basophils Absolute: 0 10*3/uL (ref 0.0–0.1)
Basophils Relative: 0 %
Eosinophils Absolute: 0 10*3/uL (ref 0.0–0.5)
Eosinophils Relative: 0 %
HCT: 27.2 % — ABNORMAL LOW (ref 39.0–52.0)
Hemoglobin: 8.7 g/dL — ABNORMAL LOW (ref 13.0–17.0)
Immature Granulocytes: 1 %
Lymphocytes Relative: 58 %
Lymphs Abs: 4.7 10*3/uL — ABNORMAL HIGH (ref 0.7–4.0)
MCH: 39.2 pg — ABNORMAL HIGH (ref 26.0–34.0)
MCHC: 32 g/dL (ref 30.0–36.0)
MCV: 122.5 fL — ABNORMAL HIGH (ref 80.0–100.0)
Monocytes Absolute: 0.7 10*3/uL (ref 0.1–1.0)
Monocytes Relative: 9 %
Neutro Abs: 2.6 10*3/uL (ref 1.7–7.7)
Neutrophils Relative %: 32 %
Platelets: 229 10*3/uL (ref 150–400)
RBC: 2.22 MIL/uL — ABNORMAL LOW (ref 4.22–5.81)
RDW: 25.2 % — ABNORMAL HIGH (ref 11.5–15.5)
WBC: 8.1 10*3/uL (ref 4.0–10.5)
nRBC: 0.4 % — ABNORMAL HIGH (ref 0.0–0.2)

## 2020-04-09 LAB — C-REACTIVE PROTEIN: CRP: 4 mg/dL — ABNORMAL HIGH (ref <1.0)

## 2020-04-09 LAB — MAGNESIUM: Magnesium: 2.2 mg/dL (ref 1.7–2.4)

## 2020-04-09 LAB — D-DIMER, QUANTITATIVE: D-Dimer, Quant: 0.34 ug/mL-FEU (ref 0.00–0.50)

## 2020-04-09 NOTE — Progress Notes (Signed)
PROGRESS NOTE    Tommy Peterson  QPY:[id number] DOB: April 17, 1941 DOA: 04/06/2020 PCP: [facility]    Brief Narrative:  79 year old gentleman with chronic large granular lymphocytic leukemia, chronic diarrhea on budesonide, hypertension and diabetes, vaccinated against COVID-19 started feeling weak and intermittent fever at home, tested positive for COVID-19 at [facility] on 8/27 presented with ongoing fever and extreme weakness. In the emergency room hemodynamically stable.  Initially on room air and was given a dose of Regeneron in the ER for discharge, he started having temperature 102.9 tachycardic and hypoxic to 82% on room air recovered on 2 L so hospitalized.   Assessment & Plan:   Principal Problem:   Acute hypoxemic respiratory failure due to COVID-19 [facility]) Active Problems:   Anemia   Large granular lymphocytic leukemia ([place])   Sepsis ([place])  Acute hypoxemic respiratory failure due to COVID-19 pneumonia in an immunocompromised patient: Sepsis present on admission, sepsis criteria met with SIRS criteria fever, tachycardia, tachypnea and leukocytosis along with presence of COVID-19 virus infection Continue to monitor due to significant symptoms  chest physiotherapy, incentive spirometry, deep breathing exercises, sputum induction, mucolytic's and bronchodilators. Supplemental oxygen to keep saturations more than 90%. Covid directed therapy with , steroids, on Solu-Medrol remdesivir, day 3/5 Baricitinib, day 3/14 antibiotics, started with concern of superadded bacterial infection due to immunocompromise condition. Due to severity of symptoms, patient will need daily inflammatory markers, chest x-rays, liver function test to monitor and direct COVID-19 therapies.  COVID-19 Labs  Recent Labs    04/07/20 0055 04/08/20 0846 04/08/20 0848 04/09/20 0417  DDIMER 0.44 0.39  --  0.34  FERRITIN 1,905*  --  2,122* 2,604*  LDH 182  --   --   --   CRP 5.7*  --  7.4* 4.0*     Lab Results  Component Value Date   SARSCOV2NAA POSITIVE (A) 04/07/2020   [place] Not Detected 10/20/2019   SpO2: 93 % O2 Flow Rate (L/min): 2 L/min  Leukemia: Currently stable.    Acute on chronic macrocytic anemia:  Hemoglobin 7.7.  Longstanding fatigue and weakness with symptomatic anemia.  1 unit PRBC given.  Appropriately responded.  Type 2 diabetes: On insulin and Actos at home.  Currently remains on escalating dose of insulin to cover for steroids.  Tradjenta.  Chronic diarrhea/collagenous colitis: On budesonide at home.  Currently on Solu-Medrol.  DVT prophylaxis: enoxaparin (LOVENOX) injection 40 mg Start: 04/07/20 1300 SCDs Start: 04/07/20 1251   Code Status: Full code Family Communication: Patient's daughter on the phone 9/2. Disposition Plan: Status is: Inpatient  Remains inpatient appropriate because:Inpatient level of care appropriate due to severity of illness   Dispo: The patient is from: Home              Anticipated d/c is to: Home              Anticipated d/c date is: 1 to 2 days.              Patient currently is not medically stable to d/c.         Consultants:   None  Procedures:   None  Antimicrobials:  Anti-infectives (From admission, onward)   Start     Dose/Rate Route Frequency Ordered Stop   04/08/20 1000  remdesivir 100 mg in sodium chloride 0.9 % 100 mL IVPB       "Followed by" Linked Group Details   100 mg 200 mL/hr over 30 Minutes Intravenous Daily 04/07/20 0857 04/12/20 0959  04/08/20 1000  remdesivir 100 mg in sodium chloride 0.9 % 100 mL IVPB  Status:  Discontinued       "Followed by" Linked Group Details   100 mg 200 mL/hr over 30 Minutes Intravenous Daily 04/07/20 1250 04/07/20 1257   04/07/20 1700  cefTRIAXone (ROCEPHIN) 2 g in sodium chloride 0.9 % 100 mL IVPB        2 g 200 mL/hr over 30 Minutes Intravenous Every 24 hours 04/07/20 1633     04/07/20 1700  azithromycin (ZITHROMAX) 500 mg in sodium chloride  0.9 % 250 mL IVPB        500 mg 250 mL/hr over 60 Minutes Intravenous Every 24 hours 04/07/20 1633     04/07/20 1250  remdesivir 200 mg in sodium chloride 0.9% 250 mL IVPB  Status:  Discontinued       "Followed by" Linked Group Details   200 mg 580 mL/hr over 30 Minutes Intravenous Once 04/07/20 1250 04/07/20 1257   04/07/20 0930  remdesivir 200 mg in sodium chloride 0.9% 250 mL IVPB       "Followed by" Linked Group Details   200 mg 580 mL/hr over 30 Minutes Intravenous Once 04/07/20 0857 04/07/20 1041         Subjective:  Patient seen and examined. No overnight events. Feels tired.  Objective: Vitals:   04/09/20 0241 04/09/20 0641 04/09/20 0905 04/09/20 1134  BP: 112/64 (!) 116/55 (!) 101/58 (!) 106/53  Pulse: 61 (!) 55 60 66  Resp: $Remo'17 20 16 'mGdsb$ (!) 22  Temp: 97.7 F (36.5 C) 97.8 F (36.6 C) 97.8 F (36.6 C) 98.2 F (36.8 C)  TempSrc:   Oral Oral  SpO2: 99% 98% 97% 93%  Weight:      Height:        Intake/Output Summary (Last 24 hours) at 04/09/2020 1316 Last data filed at 04/09/2020 0945 Gross per 24 hour  Intake 1016 ml  Output --  Net 1016 ml   Filed Weights   04/07/20 1718  Weight: 72.6 kg    Examination:  Physical Exam Constitutional:      Comments: Looks comfortable. Chronically sick looking but not in any distress. Currently on 2 L of oxygen.  HENT:     Head: Atraumatic.  Cardiovascular:     Rate and Rhythm: Regular rhythm.  Pulmonary:     Breath sounds: Normal breath sounds.     Comments: No added sounds. Musculoskeletal:     Comments: Trace bilateral pedal edema.        Data Reviewed: I have personally reviewed following labs and imaging studies  CBC: Recent Labs  Lab 04/07/20 0055 04/07/20 1328 04/08/20 0846 04/09/20 0417  WBC 12.8*  --  5.9 8.1  NEUTROABS 2.7  --  2.3 2.6  HGB 8.2* 8.2* 7.7* 8.7*  HCT 25.4* 25.5* 23.5* 27.2*  MCV 127.6*  --  128.4* 122.5*  PLT 231  --  215 671   Basic Metabolic Panel: Recent Labs  Lab  04/07/20 0055 04/07/20 1328 04/08/20 0846 04/09/20 0417  NA 135  --  140 139  K 3.4*  --  4.0 4.4  CL 101  --  106 107  CO2 23  --  26 24  GLUCOSE 133*  --  147* 149*  BUN 15  --  11 17  CREATININE 0.54* 0.50* 0.35* 0.44*  CALCIUM 8.6*  --  8.4* 8.2*  MG  --   --  2.2 2.2   GFR: Estimated Creatinine Clearance:  76.1 mL/min (A) (by C-G formula based on SCr of 0.44 mg/dL (L)). Liver Function Tests: Recent Labs  Lab 04/07/20 0055 04/08/20 0846 04/09/20 0417  AST 37 34 34  ALT 46* 47* 43  ALKPHOS 55 52 47  BILITOT 1.0 0.5 0.8  PROT 6.4* 5.8* 5.6*  ALBUMIN 3.5 3.1* 2.8*   No results for input(s): LIPASE, AMYLASE in the last 168 hours. No results for input(s): AMMONIA in the last 168 hours. Coagulation Profile: No results for input(s): INR, PROTIME in the last 168 hours. Cardiac Enzymes: No results for input(s): CKTOTAL, CKMB, CKMBINDEX, TROPONINI in the last 168 hours. BNP (last 3 results) No results for input(s): PROBNP in the last 8760 hours. HbA1C: Recent Labs    04/07/20 0055  HGBA1C 7.5*   CBG: Recent Labs  Lab 04/08/20 1136 04/08/20 1704 04/08/20 2105 04/09/20 0738 04/09/20 1130  GLUCAP 158* 272* 172* 106* 117*   Lipid Profile: Recent Labs    04/07/20 0055  TRIG 111   Thyroid Function Tests: No results for input(s): TSH, T4TOTAL, FREET4, T3FREE, THYROIDAB in the last 72 hours. Anemia Panel: Recent Labs    04/08/20 0848 04/09/20 0417  FERRITIN 2,122* 2,604*   Sepsis Labs: Recent Labs  Lab 04/07/20 0055  PROCALCITON 0.27  LATICACIDVEN 1.2    Recent Results (from the past 240 hour(s))  SARS Coronavirus 2 by RT PCR (hospital order, performed in [facility] hospital lab) Nasopharyngeal Nasopharyngeal Swab     Status: Abnormal   Collection Time: 04/07/20 12:55 AM   Specimen: Nasopharyngeal Swab  Result Value Ref Range Status   SARS Coronavirus 2 POSITIVE (A) NEGATIVE Final    Comment: RESULT CALLED TO, READ BACK BY AND VERIFIED WITH: RN I  HODGES AT 1638 04/07/20 CRU[place] A (NOTE) SARS-CoV-2 target nucleic acids are DETECTED  SARS-CoV-2 RNA is generally detectable in upper respiratory specimens  during the acute phase of infection.  Positive results are indicative  of the presence of the identified virus, but do not rule out bacterial infection or co-infection with other pathogens not detected by the test.  Clinical correlation with patient history and  other diagnostic information is necessary to determine patient infection status.  The expected result is negative.  Fact Sheet for Patients:   StrictlyIdeas.no   Fact Sheet for Healthcare Providers:   BankingDealers.co.za    This test is not yet approved or cleared by the [place] FDA and  has been authorized for detection and/or diagnosis of SARS-CoV-2 by FDA under an Emergency Use Authorization (EUA).  This EUA will remain in effect (meaning t his test can be used) for the duration of  the COVID-19 declaration under Section 564(b)(1) of the Act, 21 U.S.C. section 360-bbb-3(b)(1), unless the authorization is terminated or revoked sooner.  Performed at [facility], [place] [address], [place], [place] [address]   Blood Culture (routine x 2)     Status: None (Preliminary result)   Collection Time: 04/07/20 12:55 AM   Specimen: BLOOD  Result Value Ref Range Status   Specimen Description   Final    BLOOD BLOOD RIGHT FOREARM Performed at [facility] [address], [place], [place] [address]    Special Requests   Final    BOTTLES DRAWN AEROBIC AND ANAEROBIC Blood Culture results may not be optimal due to an inadequate volume of blood received in culture bottles Performed at [facility] [address], [place], [place] [address]    Culture  Final    NO GROWTH 2 DAYS Performed at [facility], [place] [address] [place], [place] [address]    Report  Status PENDING  Incomplete  Blood Culture (routine x 2)     Status: None (Preliminary result)   Collection Time: 04/07/20 12:55 AM   Specimen: BLOOD  Result Value Ref Range Status   Specimen Description   Final    BLOOD RIGHT HAND Performed at [facility] [address] [place], [place] [address]    Special Requests   Final    BOTTLES DRAWN AEROBIC AND ANAEROBIC Blood Culture results may not be optimal due to an inadequate volume of blood received in culture bottles Performed at [facility] [address] [place], [place] [address]    Culture   Final    NO GROWTH 2 DAYS Performed at [facility] [address] [place], [place] [address]    Report Status PENDING  Incomplete  MRSA PCR Screening     Status: None   Collection Time: 04/07/20 11:20 PM   Specimen: Nasal Mucosa; Nasopharyngeal  Result Value Ref Range Status   MRSA by PCR NEGATIVE NEGATIVE Final    Comment:        The GeneXpert MRSA Assay (FDA approved for NASAL specimens only), is one component of a comprehensive MRSA colonization surveillance program. It is not intended to diagnose MRSA infection nor to guide or monitor treatment for MRSA infections. Performed at [facility], [place] [address] [place], [place] [address]          Radiology Studies: No results found.      Scheduled Meds: . sodium chloride   Intravenous Once  . albuterol  2 puff Inhalation Q6H  . baricitinib  4 mg Oral Daily  . enoxaparin (LOVENOX) injection  40 mg Subcutaneous Q24H  . folic acid  1 mg Oral Daily  . insulin aspart  0-20 Units Subcutaneous TID WC  . insulin detemir  0.15 Units/kg Subcutaneous BID  . linagliptin  5 mg Oral Daily  . methylPREDNISolone (SOLU-MEDROL) injection  0.5 mg/kg Intravenous Q12H  . vitamin B-12  100 mcg Oral Daily   Continuous Infusions: . sodium chloride    . azithromycin Stopped (04/08/20 2242)  . cefTRIAXone (ROCEPHIN)   IV 2 g (04/08/20 1737)  . famotidine (PEPCID) IV    . remdesivir 100 mg in NS 100 mL 100 mg (04/09/20 0958)     LOS: 2 days    Time spent: 30 minutes    Barb Merino, MD [facility] Pager [phone]

## 2020-04-09 NOTE — Progress Notes (Signed)
Patient having non-sustainable decrease in heart rate into the 40's- low 60's on the continuous pulse ox monitor. Patient showing no signs and symptoms of distress, talking with this nurse and charge nurse without difficulty and no new symptoms of SOB noted. Spoke with charge nurse and I was informed to pass the info on to the on-coming nurse in report and to continue to monitor the patient. Patients heart rate was in the 30's on previously on day shift. Will continue to monitor the patient.

## 2020-04-09 NOTE — Progress Notes (Signed)
Patients HR is 55 on, he is alert and verbal with no new signs and symptoms of distress.

## 2020-04-10 LAB — CBC WITH DIFFERENTIAL/PLATELET
Abs Immature Granulocytes: 0.26 10*3/uL — ABNORMAL HIGH (ref 0.00–0.07)
Basophils Absolute: 0 10*3/uL (ref 0.0–0.1)
Basophils Relative: 0 %
Eosinophils Absolute: 0 10*3/uL (ref 0.0–0.5)
Eosinophils Relative: 0 %
HCT: 28.1 % — ABNORMAL LOW (ref 39.0–52.0)
Hemoglobin: 8.9 g/dL — ABNORMAL LOW (ref 13.0–17.0)
Immature Granulocytes: 5 %
Lymphocytes Relative: 32 %
Lymphs Abs: 1.8 10*3/uL (ref 0.7–4.0)
MCH: 38 pg — ABNORMAL HIGH (ref 26.0–34.0)
MCHC: 31.7 g/dL (ref 30.0–36.0)
MCV: 120.1 fL — ABNORMAL HIGH (ref 80.0–100.0)
Monocytes Absolute: 0.5 10*3/uL (ref 0.1–1.0)
Monocytes Relative: 9 %
Neutro Abs: 3 10*3/uL (ref 1.7–7.7)
Neutrophils Relative %: 54 %
Platelets: 264 10*3/uL (ref 150–400)
RBC: 2.34 MIL/uL — ABNORMAL LOW (ref 4.22–5.81)
RDW: 24 % — ABNORMAL HIGH (ref 11.5–15.5)
WBC: 5.7 10*3/uL (ref 4.0–10.5)
nRBC: 0.4 % — ABNORMAL HIGH (ref 0.0–0.2)

## 2020-04-10 LAB — COMPREHENSIVE METABOLIC PANEL
ALT: 52 U/L — ABNORMAL HIGH (ref 0–44)
AST: 32 U/L (ref 15–41)
Albumin: 2.9 g/dL — ABNORMAL LOW (ref 3.5–5.0)
Alkaline Phosphatase: 50 U/L (ref 38–126)
Anion gap: 7 (ref 5–15)
BUN: 17 mg/dL (ref 8–23)
CO2: 26 mmol/L (ref 22–32)
Calcium: 8.2 mg/dL — ABNORMAL LOW (ref 8.9–10.3)
Chloride: 105 mmol/L (ref 98–111)
Creatinine, Ser: 0.48 mg/dL — ABNORMAL LOW (ref 0.61–1.24)
GFR calc Af Amer: >60 mL/min (ref >60)
GFR calc non Af Amer: >60 mL/min (ref >60)
Glucose, Bld: 167 mg/dL — ABNORMAL HIGH (ref 70–99)
Potassium: 3.9 mmol/L (ref 3.5–5.1)
Sodium: 138 mmol/L (ref 135–145)
Total Bilirubin: 0.8 mg/dL (ref 0.3–1.2)
Total Protein: 5.7 g/dL — ABNORMAL LOW (ref 6.5–8.1)

## 2020-04-10 LAB — FERRITIN: Ferritin: 2012 ng/mL — ABNORMAL HIGH (ref 24–336)

## 2020-04-10 LAB — D-DIMER, QUANTITATIVE: D-Dimer, Quant: 0.34 ug/mL-FEU (ref 0.00–0.50)

## 2020-04-10 LAB — C-REACTIVE PROTEIN: CRP: 2.2 mg/dL — ABNORMAL HIGH (ref <1.0)

## 2020-04-10 LAB — MAGNESIUM: Magnesium: 2.3 mg/dL (ref 1.7–2.4)

## 2020-04-10 LAB — GLUCOSE, CAPILLARY: Glucose-Capillary: 210 mg/dL — ABNORMAL HIGH (ref 70–99)

## 2020-04-10 MED ORDER — GUAIFENESIN-DM 100-10 MG/5ML PO SYRP: 10.0000 mL | ORAL_SOLUTION | ORAL | 0 refills | Status: DC | PRN

## 2020-04-10 MED ORDER — ALBUTEROL SULFATE HFA 108 (90 BASE) MCG/ACT IN AERS: 2.0000 | INHALATION_SPRAY | Freq: Four times a day (QID) | RESPIRATORY_TRACT | 1 refills | Status: DC

## 2020-04-10 MED ORDER — DEXAMETHASONE 6 MG PO TABS: 6.0000 mg | ORAL_TABLET | Freq: Every day | ORAL | 0 refills | Status: AC

## 2020-04-10 NOTE — Plan of Care (Signed)
Pt going home today with his daughter. Alert, oriented, and aware of discharge. Pt aware to come to hospital tomorrow for his last infusion of Remdesivir. Pt aware to followup with PCP.

## 2020-04-10 NOTE — Progress Notes (Signed)
Patient scheduled for outpatient Remdesivir infusions at 10 AM on Sunday 9/5 at [facility]. Please inform the patient to park at [place], as staff will be escorting the patient through the [place] entrance of the hospital.    There is a wave flag banner located near the entrance on [address]. Turn into this entrance and immediately turn left and park in 1 of the 5 designated Covid Infusion Parking spots. There is a phone number on the sign, please call and let the staff know what spot you are in and we will come out and get you. For questions call [phone].  Thanks.  * If patient is getting dropped off, you may have your ride pull up to the Main Entrance of [facility]. Please stay in the car and call [phone], staff will meet you at your car and escort you into the hospital and back to the clinic.

## 2020-04-10 NOTE — Discharge Summary (Signed)
Physician Discharge Summary  Tommy Peterson GBT:[id number] DOB: [birth year] DOA: 04/06/2020  PCP: [facility]  Admit date: 04/06/2020 Discharge date: 04/10/2020  Admitted From: Home Disposition: Home  Recommendations for Outpatient Follow-up:  1. Follow up with PCP in 1-2 weeks 2. Come back for infusion tomorrow as a scheduled.   Discharge Condition: Fair CODE STATUS: Full code Diet recommendation: Low-salt low-carb diet  Discharge summary: 79 year old gentleman with chronic large granular lymphocytic leukemia, chronic diarrhea on budesonide, hypertension and diabetes on insulin, vaccinated against COVID-19, feeling weak and tired with intermittent fever at home tested positive for COVID-19 at the [facility] facility on 8/27 presented to the ER with extreme weakness and ongoing fever.  In the emergency room, initially evaluated for antibody administration, however started having temperature and hypoxic to 82% on room air so admitted to the hospital.  Patient was admitted to hospital and treated for acute hypoxemic respiratory failure secondary to COVID-19 pneumonia in an immunocompromised patient. Sepsis was present on admission, treated and improved. Patient was treated with high-dose steroids, with current symptom improvement we will continue 3 more days of dexamethasone along with his chronic steroid therapy that he will continue. Finished remdesivir therapy day 4/5, patient is able to come back tomorrow for fifth dose to infusion center. Patient was given 4 days of IV antibiotics with Rocephin azithromycin, his symptoms have improved.  Most of his symptoms were due to viral infection, no evidence of bacterial infection. Was started on baricitinib, however now with complete resolution of symptoms.Marland Kitchen He will continue all his longstanding medications.  With his chronic leukemia, he was found to be with hemoglobin 7.7 and symptomatic anemia, was given 1 unit of PRBC with  appropriate response, hemoglobin today is 8.9 and patient subjectively felt well. Patient has enough support at home, able to go home.   Discharge Diagnoses:  Principal Problem:   Acute hypoxemic respiratory failure due to COVID-19 [facility]) Active Problems:   Anemia   Large granular lymphocytic leukemia ([place])   Sepsis [facility])    Discharge Instructions  Discharge Instructions    Call MD for:  difficulty breathing, headache or visual disturbances   Complete by: As directed    Call MD for:  persistant dizziness or light-headedness   Complete by: As directed    Call MD for:  temperature >100.4   Complete by: As directed    Diet Carb Modified   Complete by: As directed    Discharge instructions   Complete by: As directed    Come back for infusion tomorrow as scheduled  Can take over the counter cough medicine and tylenol as needed  Total isolation for 3 weeks since positive test results  Will prescribe you some additional steroids for next 3 days , continue taking your budesonide   Increase activity slowly   Complete by: As directed      Allergies as of 04/10/2020      Reactions   Bee Venom Swelling, Anaphylaxis   Metformin And Related Nausea And Vomiting   Sulfamethoxazole-trimethoprim Nausea And Vomiting      Medication List    STOP taking these medications   gabapentin 100 MG capsule Commonly known as: NEURONTIN     TAKE these medications   acetaminophen 325 MG tablet Commonly known as: TYLENOL Take 2 tablets (650 mg total) by mouth every 6 (six) hours as needed for mild pain (or Fever >/= 101).   albuterol 108 (90 Base) MCG/ACT inhaler Commonly known as: VENTOLIN HFA Inhale 2 puffs  into the lungs every 6 (six) hours.   b complex vitamins capsule Take 1 capsule by mouth daily.   budesonide 3 MG 24 hr capsule Commonly known as: ENTOCORT EC Take 6 mg by mouth daily.   Cranberry 400 MG Tabs Take 1 tablet by mouth daily.   D-Mannose 500 MG Caps Take 500 mg by  mouth daily.   dexamethasone 6 MG tablet Commonly known as: Decadron Take 1 tablet (6 mg total) by mouth daily for 3 days.   folic acid 1 MG tablet Commonly known as: FOLVITE Take 1 mg by mouth daily.   guaiFENesin-dextromethorphan 100-10 MG/5ML syrup Commonly known as: ROBITUSSIN DM Take 10 mLs by mouth every 4 (four) hours as needed for cough.   NovoLOG FlexPen 100 UNIT/ML FlexPen Generic drug: insulin aspart Inject 4 Units into the skin in the morning and at bedtime.   pioglitazone 15 MG tablet Commonly known as: ACTOS Take 15 mg by mouth every morning.   Soliqua 100-33 UNT-MCG/ML Sopn Generic drug: Insulin Glargine-Lixisenatide Inject 28 Units into the skin at bedtime.   vitamin B-12 100 MCG tablet Commonly known as: CYANOCOBALAMIN Take 100 mcg by mouth daily.       Follow-up Information    [facility].   Specialty: Rheumatology Contact information: [place] [address] [phone]              Allergies  Allergen Reactions  . Bee Venom Swelling and Anaphylaxis  . Metformin And Related Nausea And Vomiting  . Sulfamethoxazole-Trimethoprim Nausea And Vomiting    Consultations:  None   Procedures/Studies: DG Chest Port 1 View  Result Date: 04/07/2020 CLINICAL DATA:  COVID-19 positive EXAM: PORTABLE CHEST 1 VIEW COMPARISON:  09/01/2018 FINDINGS: Single frontal view of the chest demonstrates persistent enlargement of the cardiac silhouette. Ectasia of the thoracic aorta and atherosclerosis of the aortic arch unchanged. Since the previous exam, there is increased central vascular and interstitial prominence. Patchy consolidation seen at the left lung base. No large effusion or pneumothorax. No acute bony abnormality. IMPRESSION: 1. Central vascular congestion, with diffuse increased interstitial prominence and patchy left basilar consolidation. Appearance is nonspecific and could reflect developing edema or infection.  Electronically Signed   By: Randa Ngo M.D.   On: 04/07/2020 00:55   (Echo, Carotid, EGD, Colonoscopy, ERCP)    Subjective: Patient was seen and examined.  No overnight events.  He is mostly on room air.  Very excited to get up and walk.  He wants to go home and finished treatment as outpatient. Patient's daughter was on the phone and questions were answered.   Discharge Exam: Vitals:   04/09/20 2004 04/10/20 0515  BP: 116/78 (!) 126/49  Pulse: 70 (!) 51  Resp: 20 20  Temp: 98.8 F (37.1 C) (!) 97.4 F (36.3 C)  SpO2: 96% 98%   Vitals:   04/09/20 1654 04/09/20 1751 04/09/20 2004 04/10/20 0515  BP: 122/64 123/73 116/78 (!) 126/49  Pulse: 61 75 70 (!) 51  Resp: (!) 21 16 20 20   Temp: 98.5 F (36.9 C) 98.1 F (36.7 C) 98.8 F (37.1 C) (!) 97.4 F (36.3 C)  TempSrc: Oral   Oral  SpO2: 98% 99% 96% 98%  Weight:      Height:        General: Pt is alert, awake, not in acute distress Cardiovascular: RRR, S1/S2 +, no rubs, no gallops Respiratory: CTA bilaterally, no wheezing, no rhonchi Abdominal: Soft, NT, ND, bowel sounds + Extremities:  no edema, no cyanosis    The results of significant diagnostics from this hospitalization (including imaging, microbiology, ancillary and laboratory) are listed below for reference.     Microbiology: Recent Results (from the past 240 hour(s))  SARS Coronavirus 2 by RT PCR (hospital order, performed in [facility] hospital lab) Nasopharyngeal Nasopharyngeal Swab     Status: Abnormal   Collection Time: 04/07/20 12:55 AM   Specimen: Nasopharyngeal Swab  Result Value Ref Range Status   SARS Coronavirus 2 POSITIVE (A) NEGATIVE Final    Comment: RESULT CALLED TO, READ BACK BY AND VERIFIED WITH: RN I HODGES AT 0316 04/07/20 CRUICKSHANK A (NOTE) SARS-CoV-2 target nucleic acids are DETECTED  SARS-CoV-2 RNA is generally detectable in upper respiratory specimens  during the acute phase of infection.  Positive results are indicative  of the  presence of the identified virus, but do not rule out bacterial infection or co-infection with other pathogens not detected by the test.  Clinical correlation with patient history and  other diagnostic information is necessary to determine patient infection status.  The expected result is negative.  Fact Sheet for Patients:   StrictlyIdeas.no   Fact Sheet for Healthcare Providers:   BankingDealers.co.za    This test is not yet approved or cleared by the [place] FDA and  has been authorized for detection and/or diagnosis of SARS-CoV-2 by FDA under an Emergency Use Authorization (EUA).  This EUA will remain in effect (meaning t his test can be used) for the duration of  the COVID-19 declaration under Section 564(b)(1) of the Act, 21 U.S.C. section 360-bbb-3(b)(1), unless the authorization is terminated or revoked sooner.  Performed at [facility], [place] [address], [place], [place] [address]   Blood Culture (routine x 2)     Status: None (Preliminary result)   Collection Time: 04/07/20 12:55 AM   Specimen: BLOOD  Result Value Ref Range Status   Specimen Description   Final    BLOOD BLOOD RIGHT FOREARM Performed at [place] [address], [place], [place] [address]    Special Requests   Final    BOTTLES DRAWN AEROBIC AND ANAEROBIC Blood Culture results may not be optimal due to an inadequate volume of blood received in culture bottles Performed at [place] [address] [place], [place] [address]    Culture   Final    NO GROWTH 2 DAYS Performed at [place] [address] [place], [place] [address]    Report Status PENDING  Incomplete  Blood Culture (routine x 2)     Status: None (Preliminary result)   Collection Time: 04/07/20 12:55 AM   Specimen: BLOOD  Result Value Ref Range Status   Specimen Description   Final    BLOOD RIGHT HAND Performed  at [place] [address] [place], [place] [address]    Special Requests   Final    BOTTLES DRAWN AEROBIC AND ANAEROBIC Blood Culture results may not be optimal due to an inadequate volume of blood received in culture bottles Performed at [place] [address], [place], [place] [address]    Culture   Final    NO GROWTH 2 DAYS Performed at [place] [address], [place], [place] [address]    Report Status PENDING  Incomplete  MRSA PCR Screening     Status: None   Collection Time: 04/07/20 11:20 PM   Specimen: Nasal Mucosa; Nasopharyngeal  Result Value Ref Range Status   MRSA by PCR NEGATIVE NEGATIVE Final    Comment:        The GeneXpert MRSA Assay (FDA approved for NASAL specimens only), is one component of a comprehensive MRSA colonization surveillance program. It is not intended to diagnose MRSA infection nor to guide or monitor treatment for MRSA infections. Performed at [facility], [place] [address] [place], [place] [address]      Labs: BNP (last 3 results) No results for input(s): BNP in the last 8760 hours. Basic Metabolic Panel: Recent Labs  Lab 04/07/20 0055 04/07/20 1328 04/08/20 0846 04/09/20 0417 04/10/20 0351  NA 135  --  140 139 138  K 3.4*  --  4.0 4.4 3.9  CL 101  --  106 107 105  CO2 23  --  26 24 26   GLUCOSE 133*  --  147* 149* 167*  BUN 15  --  11 17 17   CREATININE 0.54* 0.50* 0.35* 0.44* 0.48*  CALCIUM 8.6*  --  8.4* 8.2* 8.2*  MG  --   --  2.2 2.2 2.3   Liver Function Tests: Recent Labs  Lab 04/07/20 0055 04/08/20 0846 04/09/20 0417 04/10/20 0351  AST 37 34 34 32  ALT 46* 47* 43 52*  ALKPHOS 55 52 47 50  BILITOT 1.0 0.5 0.8 0.8  PROT 6.4* 5.8* 5.6* 5.7*  ALBUMIN 3.5 3.1* 2.8* 2.9*   No results for input(s): LIPASE, AMYLASE in the last 168 hours. No results for input(s): AMMONIA in the last 168 hours. CBC: Recent Labs  Lab 04/07/20 0055  04/07/20 1328 04/08/20 0846 04/09/20 0417 04/10/20 0351  WBC 12.8*  --  5.9 8.1 5.7  NEUTROABS 2.7  --  2.3 2.6 3.0  HGB 8.2* 8.2* 7.7* 8.7* 8.9*  HCT 25.4* 25.5* 23.5* 27.2* 28.1*  MCV 127.6*  --  128.4* 122.5* 120.1*  PLT 231  --  215 229 264   Cardiac Enzymes: No results for input(s): CKTOTAL, CKMB, CKMBINDEX, TROPONINI in the last 168 hours. BNP: Invalid input(s): POCBNP CBG: Recent Labs  Lab 04/09/20 0738 04/09/20 1130 04/09/20 1558 04/09/20 2006 04/10/20 1017  GLUCAP 106* 117* 245* 284* 210*   D-Dimer Recent Labs    04/09/20 0417 04/10/20 0351  DDIMER 0.34 0.34   Hgb A1c No results for input(s): HGBA1C in the last 72 hours. Lipid Profile No results for input(s): CHOL, HDL, LDLCALC, TRIG, CHOLHDL, LDLDIRECT in the last 72 hours. Thyroid function studies No results for input(s): TSH, T4TOTAL, T3FREE, THYROIDAB in the last 72 hours.  Invalid input(s): FREET3 Anemia work up Recent Labs    04/09/20 0417 04/10/20 0351  FERRITIN 2,604* 2,012*   Urinalysis    Component Value Date/Time   COLORURINE YELLOW 09/01/2018 0325   APPEARANCEUR TURBID (A) 09/01/2018 0325   LABSPEC 1.020 09/01/2018 0325   PHURINE 5.0 09/01/2018 0325   GLUCOSEU 50 (A) 09/01/2018 0325   HGBUR LARGE (A) 09/01/2018 0325   BILIRUBINUR NEGATIVE 09/01/2018 0325   BILIRUBINUR negative 10/29/2017 1428   KETONESUR NEGATIVE 09/01/2018 0325   PROTEINUR 100 (A) 09/01/2018 0325   UROBILINOGEN 0.2 10/29/2017 1428   NITRITE POSITIVE (A) 09/01/2018 0325   LEUKOCYTESUR LARGE (A) 09/01/2018 0325   Sepsis Labs Invalid input(s): PROCALCITONIN,  WBC,  LACTICIDVEN Microbiology Recent Results (from the past 240 hour(s))  SARS Coronavirus 2 by RT PCR (hospital order, performed in [facility] hospital lab) Nasopharyngeal Nasopharyngeal Swab     Status: Abnormal   Collection Time:  04/07/20 12:55 AM   Specimen: Nasopharyngeal Swab  Result Value Ref Range Status   SARS Coronavirus 2 POSITIVE (A)  NEGATIVE Final    Comment: RESULT CALLED TO, READ BACK BY AND VERIFIED WITH: RN I HODGES AT 0354 04/07/20 CRUICKSHANK A (NOTE) SARS-CoV-2 target nucleic acids are DETECTED  SARS-CoV-2 RNA is generally detectable in upper respiratory specimens  during the acute phase of infection.  Positive results are indicative  of the presence of the identified virus, but do not rule out bacterial infection or co-infection with other pathogens not detected by the test.  Clinical correlation with patient history and  other diagnostic information is necessary to determine patient infection status.  The expected result is negative.  Fact Sheet for Patients:   StrictlyIdeas.no   Fact Sheet for Healthcare Providers:   BankingDealers.co.za    This test is not yet approved or cleared by the [place] FDA and  has been authorized for detection and/or diagnosis of SARS-CoV-2 by FDA under an Emergency Use Authorization (EUA).  This EUA will remain in effect (meaning t his test can be used) for the duration of  the COVID-19 declaration under Section 564(b)(1) of the Act, 21 U.S.C. section 360-bbb-3(b)(1), unless the authorization is terminated or revoked sooner.  Performed at [facility], [place] [address] [place], [place] [address]   Blood Culture (routine x 2)     Status: None (Preliminary result)   Collection Time: 04/07/20 12:55 AM   Specimen: BLOOD  Result Value Ref Range Status   Specimen Description   Final    BLOOD BLOOD RIGHT FOREARM Performed at [place] [address], [place], [place] [address]    Special Requests   Final    BOTTLES DRAWN AEROBIC AND ANAEROBIC Blood Culture results may not be optimal due to an inadequate volume of blood received in culture bottles Performed at [place] [address] [place], [place] [address]    Culture   Final    NO GROWTH 2  DAYS Performed at [place] [address], [place], [place] [address]    Report Status PENDING  Incomplete  Blood Culture (routine x 2)     Status: None (Preliminary result)   Collection Time: 04/07/20 12:55 AM   Specimen: BLOOD  Result Value Ref Range Status   Specimen Description   Final    BLOOD RIGHT HAND Performed at [place] [address], [place], [place] [address]    Special Requests   Final    BOTTLES DRAWN AEROBIC AND ANAEROBIC Blood Culture results may not be optimal due to an inadequate volume of blood received in culture bottles Performed at [place] [address] [place], [place] [address]    Culture   Final    NO GROWTH 2 DAYS Performed at [place] [address], [place], [place] [address]    Report Status PENDING  Incomplete  MRSA PCR Screening     Status: None   Collection Time: 04/07/20 11:20 PM   Specimen: Nasal Mucosa; Nasopharyngeal  Result Value Ref Range Status   MRSA by PCR NEGATIVE NEGATIVE Final    Comment:        The GeneXpert MRSA Assay (FDA approved for NASAL specimens only), is one component of a comprehensive MRSA colonization surveillance program. It is not intended to diagnose MRSA infection nor to guide or monitor treatment for MRSA infections. Performed at [facility], [place] [address], [place], [place] [address]      Time coordinating discharge:  45 minutes  SIGNED:   Barb Merino, MD  [facility] 04/10/2020, 11:27 AM

## 2020-04-11 ENCOUNTER — Ambulatory Visit (HOSPITAL_COMMUNITY)
Admit: 2020-04-11 | Discharge: 2020-04-11 | Disposition: A | Discharge status: Home / Self Care | Payer: Medicare HMO | Attending: Pulmonary Disease | Admitting: Pulmonary Disease

## 2020-04-11 DIAGNOSIS — U071 COVID-19: Secondary | ICD-10-CM | POA: Insufficient documentation

## 2020-04-11 DIAGNOSIS — J1282 Pneumonia due to coronavirus disease 2019: Secondary | ICD-10-CM | POA: Insufficient documentation

## 2020-04-11 MED ORDER — SODIUM CHLORIDE 0.9 % IV SOLN: INTRAVENOUS | Status: DC | PRN

## 2020-04-11 MED ORDER — SODIUM CHLORIDE 0.9 % IV SOLN
100.0000 mg | Freq: Once | INTRAVENOUS | Status: AC
  Administered 2020-04-11: 100 mg via INTRAVENOUS
  Filled 2020-04-11: qty 20

## 2020-04-11 MED ORDER — FAMOTIDINE IN NACL 20-0.9 MG/50ML-% IV SOLN: 20.0000 mg | Freq: Once | INTRAVENOUS | Status: DC | PRN

## 2020-04-11 MED ORDER — DIPHENHYDRAMINE HCL 50 MG/ML IJ SOLN: 50.0000 mg | Freq: Once | INTRAMUSCULAR | Status: DC | PRN

## 2020-04-11 MED ORDER — EPINEPHRINE 0.3 MG/0.3ML IJ SOAJ: 0.3000 mg | Freq: Once | INTRAMUSCULAR | Status: DC | PRN

## 2020-04-11 MED ORDER — METHYLPREDNISOLONE SODIUM SUCC 125 MG IJ SOLR: 125.0000 mg | Freq: Once | INTRAMUSCULAR | Status: DC | PRN

## 2020-04-11 MED ORDER — ALBUTEROL SULFATE HFA 108 (90 BASE) MCG/ACT IN AERS: 2.0000 | INHALATION_SPRAY | Freq: Once | RESPIRATORY_TRACT | Status: DC | PRN

## 2020-04-11 NOTE — Progress Notes (Signed)
  Diagnosis: COVID-19  Physician: Joya Gaskins, MD  Procedure: [facility]fusion [facility] Med: remdesivir infusion - Provided patient with remdesivir fact sheet for patients, parents and caregivers prior to infusion.  Complications: No immediate complications noted.  Discharge: Discharged home   [place] 04/11/2020

## 2020-04-11 NOTE — Discharge Instructions (Signed)
10 Things You Can Do to Manage Your COVID-19 Symptoms at Home If you have possible or confirmed COVID-19: 1. Stay home from work and school. And stay away from other public places. If you must go out, avoid using any kind of public transportation, ridesharing, or taxis. 2. Monitor your symptoms carefully. If your symptoms get worse, call your healthcare provider immediately. 3. Get rest and stay hydrated. 4. If you have a medical appointment, call the healthcare provider ahead of time and tell them that you have or may have COVID-19. 5. For medical emergencies, call 911 and notify the dispatch personnel that you have or may have COVID-19. 6. Cover your cough and sneezes with a tissue or use the inside of your elbow. 7. Wash your hands often with soap and water for at least 20 seconds or clean your hands with an alcohol-based hand sanitizer that contains at least 60% alcohol. 8. As much as possible, stay in a specific room and away from other people in your home. Also, you should use a separate bathroom, if available. If you need to be around other people in or outside of the home, wear a mask. 9. Avoid sharing personal items with other people in your household, like dishes, towels, and bedding. 10. Clean all surfaces that are touched often, like counters, tabletops, and doorknobs. Use household cleaning sprays or wipes according to the label instructions. cdc.gov/coronavirus 02/05/2019 This information is not intended to replace advice given to you by your health care provider. Make sure you discuss any questions you have with your health care provider. Document Revised: 07/10/2019 Document Reviewed: 07/10/2019 Elsevier Patient Education  2020 Elsevier Inc.  

## 2020-04-12 LAB — CULTURE, BLOOD (ROUTINE X 2)
Culture: NO GROWTH
Culture: NO GROWTH

## 2020-04-17 ENCOUNTER — Encounter (HOSPITAL_BASED_OUTPATIENT_CLINIC_OR_DEPARTMENT_OTHER): Payer: Self-pay

## 2020-04-17 ENCOUNTER — Other Ambulatory Visit: Payer: Self-pay

## 2020-04-17 ENCOUNTER — Emergency Department (HOSPITAL_BASED_OUTPATIENT_CLINIC_OR_DEPARTMENT_OTHER)
Admission: EM | Admit: 2020-04-17 | Discharge: 2020-04-18 | Disposition: A | Discharge status: Home / Self Care | Payer: Medicare HMO | Attending: Emergency Medicine | Admitting: Emergency Medicine

## 2020-04-17 ENCOUNTER — Emergency Department (HOSPITAL_BASED_OUTPATIENT_CLINIC_OR_DEPARTMENT_OTHER): Payer: Medicare HMO

## 2020-04-17 DIAGNOSIS — K5909 Other constipation: Secondary | ICD-10-CM

## 2020-04-17 DIAGNOSIS — K5900 Constipation, unspecified: Secondary | ICD-10-CM | POA: Insufficient documentation

## 2020-04-17 DIAGNOSIS — Z87891 Personal history of nicotine dependence: Secondary | ICD-10-CM | POA: Insufficient documentation

## 2020-04-17 DIAGNOSIS — U071 COVID-19: Secondary | ICD-10-CM | POA: Diagnosis not present

## 2020-04-17 DIAGNOSIS — E1165 Type 2 diabetes mellitus with hyperglycemia: Secondary | ICD-10-CM | POA: Diagnosis not present

## 2020-04-17 DIAGNOSIS — Z794 Long term (current) use of insulin: Secondary | ICD-10-CM | POA: Insufficient documentation

## 2020-04-17 DIAGNOSIS — Z79899 Other long term (current) drug therapy: Secondary | ICD-10-CM | POA: Diagnosis not present

## 2020-04-17 DIAGNOSIS — R52 Pain, unspecified: Secondary | ICD-10-CM | POA: Diagnosis not present

## 2020-04-17 DIAGNOSIS — E119 Type 2 diabetes mellitus without complications: Secondary | ICD-10-CM | POA: Insufficient documentation

## 2020-04-17 DIAGNOSIS — R0902 Hypoxemia: Secondary | ICD-10-CM | POA: Diagnosis not present

## 2020-04-17 LAB — COMPREHENSIVE METABOLIC PANEL
ALT: 40 U/L (ref 0–44)
AST: 21 U/L (ref 15–41)
Albumin: 3.1 g/dL — ABNORMAL LOW (ref 3.5–5.0)
Alkaline Phosphatase: 58 U/L (ref 38–126)
Anion gap: 9 (ref 5–15)
BUN: 15 mg/dL (ref 8–23)
CO2: 24 mmol/L (ref 22–32)
Calcium: 8.1 mg/dL — ABNORMAL LOW (ref 8.9–10.3)
Chloride: 102 mmol/L (ref 98–111)
Creatinine, Ser: 0.52 mg/dL — ABNORMAL LOW (ref 0.61–1.24)
GFR calc Af Amer: >60 mL/min (ref >60)
GFR calc non Af Amer: >60 mL/min (ref >60)
Glucose, Bld: 324 mg/dL — ABNORMAL HIGH (ref 70–99)
Potassium: 4.1 mmol/L (ref 3.5–5.1)
Sodium: 135 mmol/L (ref 135–145)
Total Bilirubin: 1.4 mg/dL — ABNORMAL HIGH (ref 0.3–1.2)
Total Protein: 6.1 g/dL — ABNORMAL LOW (ref 6.5–8.1)

## 2020-04-17 LAB — URINALYSIS, ROUTINE W REFLEX MICROSCOPIC
Bilirubin Urine: NEGATIVE
Glucose, UA: >=500 mg/dL — AB
Hgb urine dipstick: NEGATIVE
Ketones, ur: 40 mg/dL — AB
Leukocytes,Ua: NEGATIVE
Nitrite: NEGATIVE
Protein, ur: NEGATIVE mg/dL
Specific Gravity, Urine: 1.015 (ref 1.005–1.030)
pH: 7 (ref 5.0–8.0)

## 2020-04-17 LAB — CBC WITH DIFFERENTIAL/PLATELET
Abs Immature Granulocytes: 0.24 10*3/uL — ABNORMAL HIGH (ref 0.00–0.07)
Basophils Absolute: 0 10*3/uL (ref 0.0–0.1)
Basophils Relative: 0 %
Eosinophils Absolute: 0 10*3/uL (ref 0.0–0.5)
Eosinophils Relative: 0 %
HCT: 35.7 % — ABNORMAL LOW (ref 39.0–52.0)
Hemoglobin: 11.8 g/dL — ABNORMAL LOW (ref 13.0–17.0)
Immature Granulocytes: 2 %
Lymphocytes Relative: 25 %
Lymphs Abs: 4 10*3/uL (ref 0.7–4.0)
MCH: 38.7 pg — ABNORMAL HIGH (ref 26.0–34.0)
MCHC: 33.1 g/dL (ref 30.0–36.0)
MCV: 117 fL — ABNORMAL HIGH (ref 80.0–100.0)
Monocytes Absolute: 2.2 10*3/uL — ABNORMAL HIGH (ref 0.1–1.0)
Monocytes Relative: 13 %
Neutro Abs: 9.8 10*3/uL — ABNORMAL HIGH (ref 1.7–7.7)
Neutrophils Relative %: 60 %
Platelets: 276 10*3/uL (ref 150–400)
RBC: 3.05 MIL/uL — ABNORMAL LOW (ref 4.22–5.81)
RDW: 20.1 % — ABNORMAL HIGH (ref 11.5–15.5)
WBC: 16.3 10*3/uL — ABNORMAL HIGH (ref 4.0–10.5)
nRBC: 0 % (ref 0.0–0.2)

## 2020-04-17 LAB — URINALYSIS, MICROSCOPIC (REFLEX)
Bacteria, UA: NONE SEEN
RBC / HPF: NONE SEEN RBC/hpf (ref 0–5)

## 2020-04-17 IMAGING — DX DG ABD PORTABLE 1V
1 series · 1 of 1 positions shown · non-contrast
Comparison: Acute abdominal series [DATE]

CLINICAL DATA: Constipation for 10 days, [Y6] positive

EXAM:
X-RAY ABDOMEN 1 VIEW

[abdomen kub]
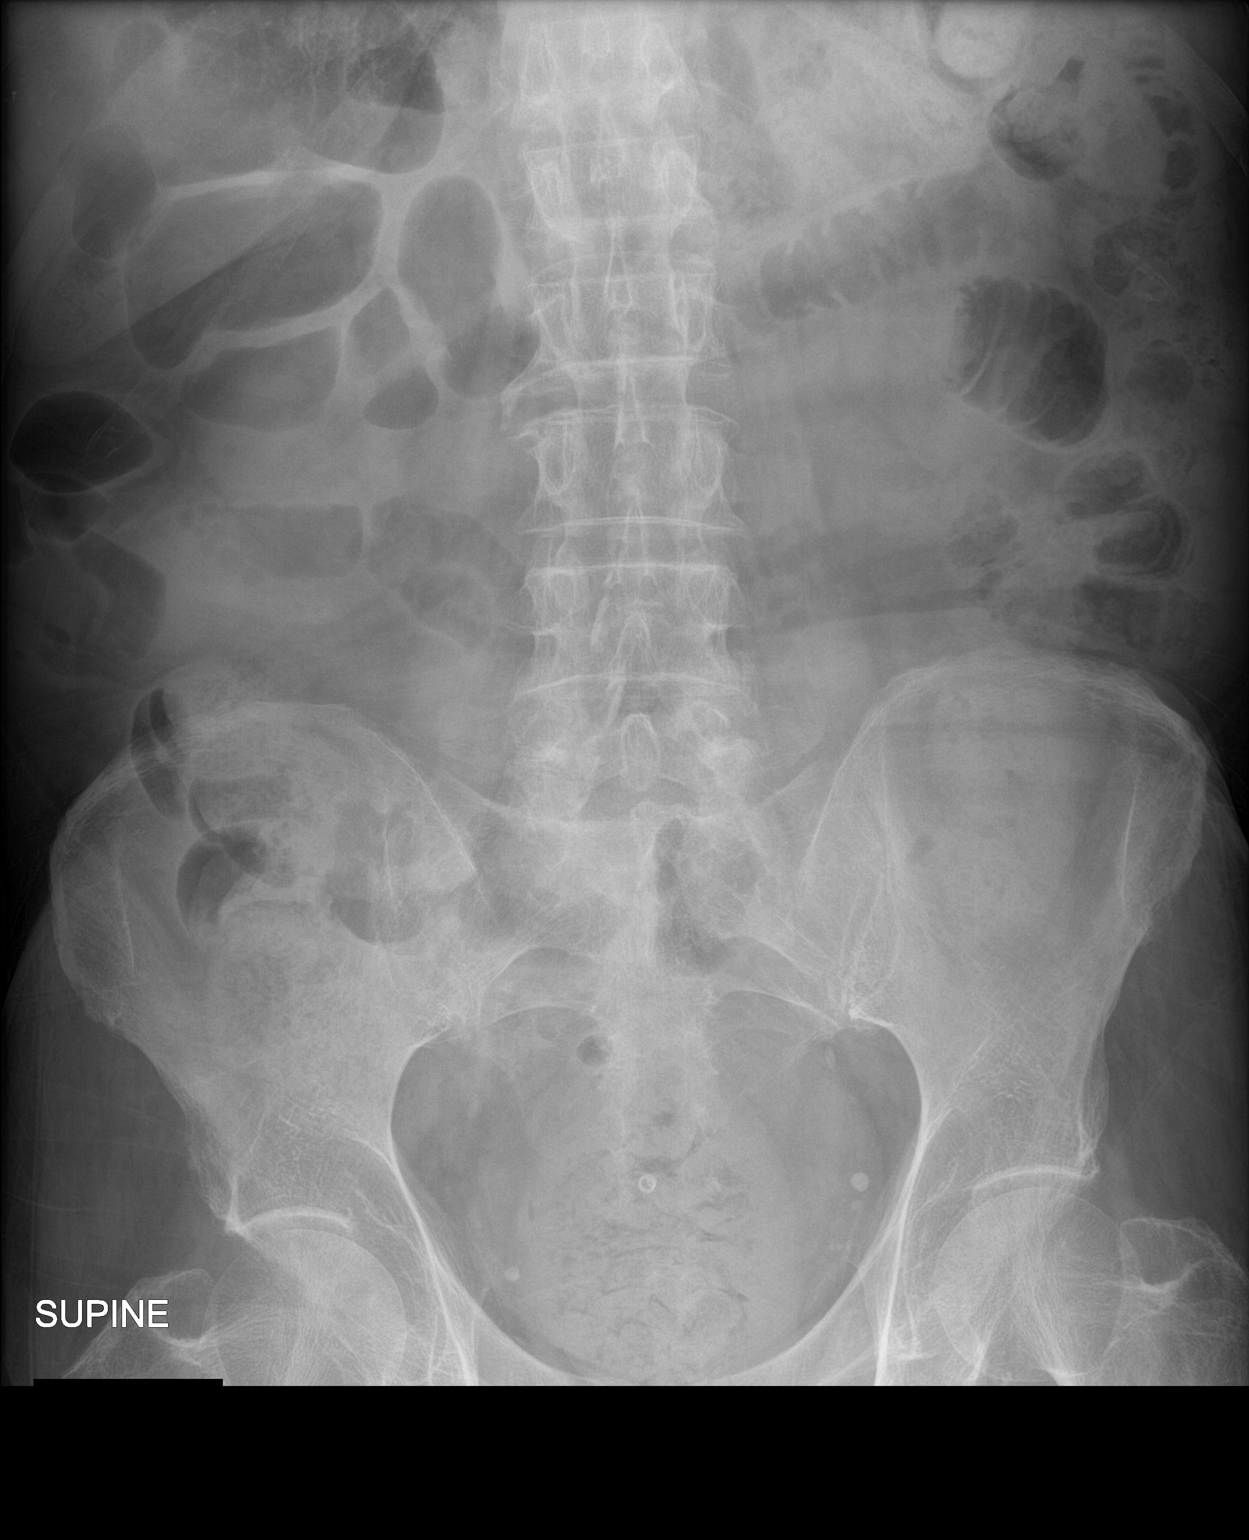

[1 of 1 positions shown; findings below may reference images not displayed]

FINDINGS: Rectal stool ball is noted up to 7.8 cm in diameter. There are
several borderline air distended loops of small bowel in the upper
abdomen which are nonspecific. No suspicious calcifications over the
gallbladder fossa or urinary tract. Phleboliths seen in the pelvis.
Metallic coil marker projects over the midline pelvis as well. Mild
degenerative changes in the spine and hips. No acute or suspicious
osseous lesions.
IMPRESSION: 1. Rectal stool ball is noted up to 7.8 cm in diameter, can be seen
in the setting of impaction/constipation.
2. Several borderline air distended loops of small bowel in the
upper abdomen are nonspecific. Could correlate for symptoms of
developing obstruction or constipation as above.

## 2020-04-17 MED ORDER — LACTULOSE 10 GM/15ML PO SOLN: 30.0000 g | Freq: Once | ORAL | Status: AC

## 2020-04-17 MED ORDER — LACTULOSE 10 GM/15ML PO SOLN
ORAL | Status: AC
  Administered 2020-04-17: 30 g via ORAL
  Filled 2020-04-17: qty 60

## 2020-04-17 MED ORDER — POLYETHYLENE GLYCOL 3350 17 G PO PACK: 17.0000 g | PACK | Freq: Two times a day (BID) | ORAL | 0 refills | Status: DC

## 2020-04-17 MED ORDER — FLEET ENEMA 7-19 GM/118ML RE ENEM
1.0000 | ENEMA | Freq: Once | RECTAL | Status: AC
  Administered 2020-04-17: 1 via RECTAL
  Filled 2020-04-17: qty 1

## 2020-04-17 MED ORDER — SODIUM CHLORIDE 0.9 % IV BOLUS
1000.0000 mL | Freq: Once | INTRAVENOUS | Status: AC
  Administered 2020-04-17: 1000 mL via INTRAVENOUS

## 2020-04-17 NOTE — ED Triage Notes (Signed)
Pt arrives to ED via [facility] from home where daughter reports that pt has had constipation X10 days. Pt was Covid + at he end August. He has been using different laxatives at home including mag citrate with no relief.

## 2020-04-17 NOTE — ED Provider Notes (Signed)
[facility] EMERGENCY DEPARTMENT Provider Note   CSN: [id number] Arrival date & time: 04/17/20  1720     History Chief Complaint  Patient presents with  . Constipation    Tommy Peterson is a 79 y.o. male history of leukemia, recent Covid required hospitalization, here present with constipation.  Patient states that his last bowel movement was when he was in the hospital.  He was discharged on September 4 and has not had any bowel movement since then. He states that it is at least 8 to 10 days since he had a bowel movement.  He states that he had tried magnesium citrate with no relief.  He also tried some laxatives as well.  He denies being on any chronic pain medicine at home.   The history is provided by the patient.       Past Medical History:  Diagnosis Date  . Acute respiratory failure ([place])   . BCC (basal cell carcinoma of skin) 08/15/2016   w/SCC Left Forehead (treatment @ [facility])  . BPH (benign prostatic hyperplasia)   . Carpal tunnel syndrome on both sides   . Chronic diarrhea    intermittant due to chronic collagenous colitis  . 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 Collagenous colitis    chronic--- intermittant between diarrahea and constipation  . ED (erectile dysfunction)   . HCAP (healthcare-associated pneumonia)   . History of adenomatous polyp of colon   . History of Clostridium difficile colitis 05/2016  . History of pneumonia 11/08/2017   CAP -- LLL---  12-19-2017  per pt no residual symptoms  . History of SCC (squamous cell carcinoma) of skin   . History of sepsis    11-20-2017 severe sepsis due to UTI;  10/ 2017sepsis due to c-diff  colitis  . Leucocytosis    chronic  . Lower urinary tract symptoms (LUTS)   . Macrocytic anemia    since 02/ 2015  . MGUS (monoclonal gammopathy of unknown significance)    hemotologist-  [name] @ [facility]  . Monoclonal paraproteinemia   . Neuropathy, peripheral   . Nodular basal cell carcinoma (BCC) 10/01/2018   Left Neck(Nodular) Pt Does Not Want Treatment  . Nodular basal cell carcinoma (BCC) 10/01/2018   Mid Forehead (treament curet and excision)  . OA (osteoarthritis)   . Prostatic stone   . Rash of face    RIGHT SIDE  . Raynaud's syndrome   . Recurrent BCC (basal cell carcinoma) 03/05/2019   positive margin  . SCC (squamous cell carcinoma) 08/10/2009   Central Forehead (Moh's Dr. Rhona Raider @ [facility])  . SCC (squamous cell carcinoma) 12/08/2011   CIS-Left Cheek (treatment Aldara @ [facility])  . SCC (squamous cell carcinoma) 07/09/2015   CIS-Left Forehead (treament @ [facility])  . Septic shock (HCC)    x 3 w/UTI  . Squamous cell carcinoma in situ (SCCIS) 12/08/2011   Left Cheek (treatment Aldara @ [facility])   . Superficial basal cell carcinoma (BCC) 10/01/2018   Left Shin-Pt does not want treatment  . Thin skin    fragile  . Transfusion-dependent anemia since 10/ 2017   last transfusion PRBCs 07-19-2017  per hemologist note dated 10-25-2017  .  Type 2 diabetes mellitus treated with insulin ([place])    followed by [name] (pcp)  . Urinary hesitancy   . Wears contact lenses    LEFT EYE ONLY    Patient Active Problem List   Diagnosis Date Noted  . Acute hypoxemic respiratory failure due to COVID-19 ([place]) 04/07/2020  . Pressure injury of skin 09/02/2018  . UTI (urinary tract infection) 09/01/2018  . Sepsis ([place]) 07/28/2018  . CAP (community acquired pneumonia) 07/28/2018  . Paroxysmal atrial fibrillation ([place]) 04/10/2018  . Aortic atherosclerosis ([place]) 04/08/2018  . DM type 2 (diabetes mellitus, type 2) ([place]) 02/19/2018  . Acute respiratory failure with  hypoxia ([place]) 02/18/2018  . MGUS (monoclonal gammopathy of unknown significance) 11/23/2017  . BPH (benign prostatic hyperplasia) 11/23/2017  . Left lower lobe pneumonia 11/20/2017  . Nausea vomiting and diarrhea 11/20/2017  . Leukemia not having achieved remission ([place]) 12/26/2016  . Anemia 12/26/2016  . Large granular lymphocytic leukemia ([place]) 12/26/2016  . Diabetes mellitus with complication ([place])   . Macrocytic anemia   . DOE (dyspnea on exertion)   . Anxiety   . History of GI bleed   . C. difficile colitis 05/31/2016  . Lactic acidosis   . History of bone marrow biopsy 05/27/2016  . Fever 05/27/2016  . Encephalopathy in sepsis 05/27/2016  . Chest pain 05/04/2016  . Benign fibroma of prostate 09/20/2015  . HLD (hyperlipidemia) 09/20/2015  . BP (high blood pressure) 09/20/2015  . Arthritis, degenerative 09/20/2015  . Colitis, collagenous 09/09/2015  . Absolute anemia 09/08/2014  . Adenoma of large intestine 07/13/2014  . Disturbance of skin sensation 01/09/2014  . Recurrent major depressive disorder, in full remission ([place]) 07/18/2012  . Allergic rhinitis 08/29/2011    Past Surgical History:  Procedure Laterality Date  . BONE MARROW BIOPSY Right 05-24-2016;  08-20-2014  . CARDIOVASCULAR STRESS TEST  11-26-2012   [name]  @ [place]   normal nuclear study w/ no ischemia/  normal LV function and wall motion , ef 60% (find in care everywhere,epic)  . CATARACT EXTRACTION W/ INTRAOCULAR LENS  IMPLANT, BILATERAL  2015  . CYSTOSCOPY WITH LITHOLAPAXY N/A 12/25/2017   Procedure: CYSTOSCOPY WITH LITHOLAPAXY AND FULGERATION;  Surgeon: [name], MD;  [facility];  Service: Urology;  Laterality: N/A;  . INGUINAL HERNIA REPAIR Right 1980s  . LOOP RECORDER INSERTION N/A 04/10/2018   Procedure: LOOP RECORDER INSERTION;  Surgeon: [name], MD;  Location: [facility];  Service: Cardiovascular;  Laterality: N/A;  . PENILE  PROSTHESIS IMPLANT  12-02-2015   dr [name] @ [facility] in [place]  . SHOULDER SURGERY Left 08/2016  . TONSILLECTOMY  child  . TRANSURETHRAL RESECTION OF PROSTATE  2009   AND REPAIR RECURRENT RIGHT INGUINAL HERNIA       Family History  Problem Relation Age of Onset  . Diabetes Mother   . Heart failure Mother     Social History   Tobacco Use  . Smoking status: Former Smoker    Years: 20.00    Types: Cigarettes    Quit date: 05/04/1996    Years since quitting: 23.9  . Smokeless tobacco: Never Used  Vaping Use  . Vaping Use: Never used  Substance Use Topics  . Alcohol use: Yes    Comment: 1-2 glasses of wine every other day  . Drug use: No    Home Medications Prior to Admission medications   Medication Sig Start Date  End Date Taking? Authorizing Provider  acetaminophen (TYLENOL) 325 MG tablet Take 2 tablets (650 mg total) by mouth every 6 (six) hours as needed for mild pain (or Fever >/= 101). 11/24/17   Elgergawy, Silver Huguenin, MD  albuterol (VENTOLIN HFA) 108 (90 Base) MCG/ACT inhaler Inhale 2 puffs into the lungs every 6 (six) hours. 04/10/20   Barb Merino, MD  b complex vitamins capsule Take 1 capsule by mouth daily.    [provider]  budesonide (ENTOCORT EC) 3 MG 24 hr capsule Take 6 mg by mouth daily.    [provider]  Cranberry 400 MG TABS Take 1 tablet by mouth daily.     [provider]  D-Mannose 500 MG CAPS Take 500 mg by mouth daily.    [provider]  folic acid (FOLVITE) 1 MG tablet Take 1 mg by mouth daily.    [provider]  guaiFENesin-dextromethorphan (ROBITUSSIN DM) 100-10 MG/5ML syrup Take 10 mLs by mouth every 4 (four) hours as needed for cough. 04/10/20   Barb Merino, MD  Insulin Glargine-Lixisenatide (SOLIQUA) 100-33 UNT-MCG/ML SOPN Inject 28 Units into the skin at bedtime.     [provider]  NOVOLOG FLEXPEN 100 UNIT/ML FlexPen Inject 4 Units into the skin in the morning and at  bedtime.  02/21/19   [provider]  pioglitazone (ACTOS) 15 MG tablet Take 15 mg by mouth every morning.  10/18/17   [provider]  vitamin B-12 (CYANOCOBALAMIN) 100 MCG tablet Take 100 mcg by mouth daily.    [provider]    Allergies    Bee venom, Metformin and related, and Sulfamethoxazole-trimethoprim  Review of Systems   Review of Systems  Gastrointestinal: Positive for constipation.  All other systems reviewed and are negative.   Physical Exam Updated Vital Signs BP (!) 118/55   Pulse 78   Temp 98.3 F (36.8 C) (Oral)   Resp 18   SpO2 100%   Physical Exam Vitals and nursing note reviewed.  Constitutional:      Comments: Chronically ill, uncomfortable  HENT:     Head: Normocephalic.     Nose: Nose normal.     Mouth/Throat:     Mouth: Mucous membranes are dry.  Eyes:     Extraocular Movements: Extraocular movements intact.     Pupils: Pupils are equal, round, and reactive to light.  Cardiovascular:     Rate and Rhythm: Normal rate and regular rhythm.  Pulmonary:     Effort: Pulmonary effort is normal.     Breath sounds: Normal breath sounds.  Abdominal:     General: Abdomen is flat.     Comments: Firm  Mild LLQ tenderness   Genitourinary:    Comments: Rectal- + stool impaction with soft stool  Musculoskeletal:        General: Normal range of motion.     Cervical back: Normal range of motion.  Skin:    General: Skin is warm.     Capillary Refill: Capillary refill takes less than 2 seconds.  Neurological:     General: No focal deficit present.     Mental Status: He is oriented to person, place, and time.  Psychiatric:        Mood and Affect: Mood normal.        Behavior: Behavior normal.     ED Results / Procedures / Treatments   Labs (all labs ordered are listed, but only abnormal results are displayed) Labs Reviewed  CBC WITH  DIFFERENTIAL/PLATELET - Abnormal; Notable for the following components:      Result Value    WBC 16.3 (*)    RBC 3.05 (*)    Hemoglobin 11.8 (*)    HCT 35.7 (*)    MCV 117.0 (*)    MCH 38.7 (*)    RDW 20.1 (*)    Neutro Abs 9.8 (*)    Monocytes Absolute 2.2 (*)    Abs Immature Granulocytes 0.24 (*)    All other components within normal limits  COMPREHENSIVE METABOLIC PANEL - Abnormal; Notable for the following components:   Glucose, Bld 324 (*)    Creatinine, Ser 0.52 (*)    Calcium 8.1 (*)    Total Protein 6.1 (*)    Albumin 3.1 (*)    Total Bilirubin 1.4 (*)    All other components within normal limits  URINALYSIS, ROUTINE W REFLEX MICROSCOPIC - Abnormal; Notable for the following components:   Glucose, UA >=500 (*)    Ketones, ur 40 (*)    All other components within normal limits  URINALYSIS, MICROSCOPIC (REFLEX)    EKG None  Radiology DG Abd Portable 1 View  Result Date: 04/17/2020 CLINICAL DATA:  Constipation for 10 days, COVID-19 positive EXAM: X-RAY ABDOMEN 1 VIEW COMPARISON:  Acute abdominal series 11/21/2017 FINDINGS: Rectal stool ball is noted up to 7.8 cm in diameter. There are several borderline air distended loops of small bowel in the upper abdomen which are nonspecific. No suspicious calcifications over the gallbladder fossa or urinary tract. Phleboliths seen in the pelvis. Metallic coil marker projects over the midline pelvis as well. Mild degenerative changes in the spine and hips. No acute or suspicious osseous lesions. IMPRESSION: 1. Rectal stool ball is noted up to 7.8 cm in diameter, can be seen in the setting of impaction/constipation. 2. Several borderline air distended loops of small bowel in the upper abdomen are nonspecific. Could correlate for symptoms of developing obstruction or constipation as above. Electronically Signed   By: Lovena Le M.D.   On: 04/17/2020 21:29    Procedures Procedures (including critical care time)  Medications Ordered in ED Medications  sodium phosphate (FLEET) 7-19 GM/118ML enema 1 enema (1 enema Rectal Given  04/17/20 1851)  lactulose (CHRONULAC) 10 GM/15ML solution 30 g (30 g Oral Given 04/17/20 1846)  sodium chloride 0.9 % bolus 1,000 mL ( Intravenous Stopped 04/17/20 1951)    ED Course  I have reviewed the triage vital signs and the nursing notes.  Pertinent labs & imaging results that were available during my care of the patient were reviewed by me and considered in my medical decision making (see chart for details).    MDM Rules/Calculators/A&P                         ROSA GAMBALE is a 79 y.o. male here presenting with constipation.  Patient has been constipated for the last 10 days or so.  Was recently in the hospital.  Will get basic lab work and try enema and lactulose.  Will hydrate patient as well.   11:21 PM Patient's white blood cell count is 16.  However his urinalysis is normal. His abdomen is nontender now that he has several bowel movements.  His x-ray showed constipation including a stool ball. I do not think he has any SBO clinically.Will dc home with miralax    Final Clinical Impression(s) / ED Diagnoses Final diagnoses:  None    Rx /  DC Orders ED Discharge Orders    None       Drenda Freeze, MD 04/17/20 2322

## 2020-04-17 NOTE — Discharge Instructions (Signed)
You still are constipated based on your x-ray.  Please take MiraLAX twice daily.  Stay hydrated.  See your doctor for follow-up  Return to ER if you have severe abdominal pain, fever, vomiting

## 2020-04-17 NOTE — ED Notes (Signed)
Able to place IV 22 g  in left wrist, no blood return. EDP Darl Householder and RN Joss notified

## 2020-04-17 NOTE — ED Notes (Signed)
Assisted patient with calling ride and provided discharge information to patient and abbi, family member. Ride en route for patient, to arrive in approx 30 minutes.

## 2020-04-17 NOTE — ED Notes (Signed)
Discussed soap suds enema ordered by EDP with pt. Pt states "I don't think I can stand another enema. I want to go home". Dr. Darl Householder made aware

## 2020-04-21 DIAGNOSIS — Z09 Encounter for follow-up examination after completed treatment for conditions other than malignant neoplasm: Secondary | ICD-10-CM | POA: Diagnosis not present

## 2020-04-21 DIAGNOSIS — C91Z0 Other lymphoid leukemia not having achieved remission: Secondary | ICD-10-CM | POA: Diagnosis not present

## 2020-04-21 DIAGNOSIS — J1282 Pneumonia due to coronavirus disease 2019: Secondary | ICD-10-CM | POA: Diagnosis not present

## 2020-04-22 ENCOUNTER — Inpatient Hospital Stay: Payer: Medicare HMO

## 2020-04-22 ENCOUNTER — Inpatient Hospital Stay: Payer: Medicare HMO | Admitting: Hematology

## 2020-04-30 ENCOUNTER — Ambulatory Visit: Payer: Self-pay | Admitting: Surgery

## 2020-04-30 DIAGNOSIS — K4090 Unilateral inguinal hernia, without obstruction or gangrene, not specified as recurrent: Secondary | ICD-10-CM | POA: Diagnosis not present

## 2020-05-03 ENCOUNTER — Other Ambulatory Visit: Payer: Self-pay | Admitting: Surgery

## 2020-05-03 DIAGNOSIS — E119 Type 2 diabetes mellitus without complications: Secondary | ICD-10-CM | POA: Diagnosis not present

## 2020-05-03 DIAGNOSIS — R197 Diarrhea, unspecified: Secondary | ICD-10-CM | POA: Diagnosis not present

## 2020-05-03 DIAGNOSIS — K4090 Unilateral inguinal hernia, without obstruction or gangrene, not specified as recurrent: Secondary | ICD-10-CM

## 2020-05-03 DIAGNOSIS — C9591 Leukemia, unspecified, in remission: Secondary | ICD-10-CM | POA: Diagnosis not present

## 2020-05-03 DIAGNOSIS — K52831 Collagenous colitis: Secondary | ICD-10-CM | POA: Diagnosis not present

## 2020-05-04 DIAGNOSIS — E113293 Type 2 diabetes mellitus with mild nonproliferative diabetic retinopathy without macular edema, bilateral: Secondary | ICD-10-CM | POA: Diagnosis not present

## 2020-05-04 DIAGNOSIS — Z961 Presence of intraocular lens: Secondary | ICD-10-CM | POA: Diagnosis not present

## 2020-05-04 DIAGNOSIS — H5211 Myopia, right eye: Secondary | ICD-10-CM | POA: Diagnosis not present

## 2020-05-04 DIAGNOSIS — H5202 Hypermetropia, left eye: Secondary | ICD-10-CM | POA: Diagnosis not present

## 2020-05-07 ENCOUNTER — Inpatient Hospital Stay: Payer: Medicare HMO | Attending: Hematology

## 2020-05-07 ENCOUNTER — Inpatient Hospital Stay (HOSPITAL_BASED_OUTPATIENT_CLINIC_OR_DEPARTMENT_OTHER): Payer: Medicare HMO | Admitting: Hematology

## 2020-05-07 ENCOUNTER — Other Ambulatory Visit: Payer: Self-pay

## 2020-05-07 VITALS — BP 131/68 | HR 86 | Temp 97.8°F | Resp 18 | Ht 69.5 in | Wt 165.6 lb

## 2020-05-07 DIAGNOSIS — D472 Monoclonal gammopathy: Secondary | ICD-10-CM | POA: Insufficient documentation

## 2020-05-07 DIAGNOSIS — Z8616 Personal history of COVID-19: Secondary | ICD-10-CM | POA: Diagnosis not present

## 2020-05-07 DIAGNOSIS — D649 Anemia, unspecified: Secondary | ICD-10-CM | POA: Insufficient documentation

## 2020-05-07 DIAGNOSIS — Z79899 Other long term (current) drug therapy: Secondary | ICD-10-CM | POA: Diagnosis not present

## 2020-05-07 DIAGNOSIS — Z794 Long term (current) use of insulin: Secondary | ICD-10-CM | POA: Diagnosis not present

## 2020-05-07 DIAGNOSIS — Z87891 Personal history of nicotine dependence: Secondary | ICD-10-CM | POA: Diagnosis not present

## 2020-05-07 DIAGNOSIS — C91Z0 Other lymphoid leukemia not having achieved remission: Secondary | ICD-10-CM | POA: Insufficient documentation

## 2020-05-07 DIAGNOSIS — Z9484 Stem cells transplant status: Secondary | ICD-10-CM | POA: Diagnosis not present

## 2020-05-07 LAB — CBC WITH DIFFERENTIAL/PLATELET
Abs Immature Granulocytes: 0.13 10*3/uL — ABNORMAL HIGH (ref 0.00–0.07)
Basophils Absolute: 0 10*3/uL (ref 0.0–0.1)
Basophils Relative: 0 %
Eosinophils Absolute: 0 10*3/uL (ref 0.0–0.5)
Eosinophils Relative: 0 %
HCT: 30.9 % — ABNORMAL LOW (ref 39.0–52.0)
Hemoglobin: 10.1 g/dL — ABNORMAL LOW (ref 13.0–17.0)
Immature Granulocytes: 2 %
Lymphocytes Relative: 48 %
Lymphs Abs: 3.6 10*3/uL (ref 0.7–4.0)
MCH: 38.4 pg — ABNORMAL HIGH (ref 26.0–34.0)
MCHC: 32.7 g/dL (ref 30.0–36.0)
MCV: 117.5 fL — ABNORMAL HIGH (ref 80.0–100.0)
Monocytes Absolute: 1 10*3/uL (ref 0.1–1.0)
Monocytes Relative: 13 %
Neutro Abs: 2.8 10*3/uL (ref 1.7–7.7)
Neutrophils Relative %: 37 %
Platelets: 236 10*3/uL (ref 150–400)
RBC: 2.63 MIL/uL — ABNORMAL LOW (ref 4.22–5.81)
RDW: 20.5 % — ABNORMAL HIGH (ref 11.5–15.5)
WBC: 7.6 10*3/uL (ref 4.0–10.5)
nRBC: 0 % (ref 0.0–0.2)

## 2020-05-07 LAB — RETICULOCYTES
Immature Retic Fract: 26.4 % — ABNORMAL HIGH (ref 2.3–15.9)
RBC.: 2.64 MIL/uL — ABNORMAL LOW (ref 4.22–5.81)
Retic Count, Absolute: 67.6 10*3/uL (ref 19.0–186.0)
Retic Ct Pct: 2.6 % (ref 0.4–3.1)

## 2020-05-07 LAB — VITAMIN B12: Vitamin B-12: >7500 pg/mL — ABNORMAL HIGH (ref 180–914)

## 2020-05-07 LAB — IRON AND TIBC
Iron: 186 ug/dL — ABNORMAL HIGH (ref 42–163)
Saturation Ratios: 87 % — ABNORMAL HIGH (ref 20–55)
TIBC: 215 ug/dL (ref 202–409)
UIBC: 29 ug/dL — ABNORMAL LOW (ref 117–376)

## 2020-05-07 LAB — SAMPLE TO BLOOD BANK

## 2020-05-07 LAB — FERRITIN: Ferritin: 1459 ng/mL — ABNORMAL HIGH (ref 24–336)

## 2020-05-07 NOTE — Progress Notes (Signed)
HEMATOLOGY/ONCOLOGY CLINIC NOTE  Date of Service: 05/07/2020  Patient Care Team: [facility] [place] as PCP - General (Rheumatology) Collier Bullock, MD as Referring Physician (Hematology and Oncology) [name], DO as Referring Physician (Family Medicine)  CHIEF COMPLAINTS/PURPOSE OF CONSULTATION:  Large Granular Lymphocytic Leukemia  Hem/Onc History  1)- chronic leukocytosis (neutrophilia and absolute lymphocytosis), peripheral blood lymphocytes about 5000, about 1/4 of cells have LGL morphology 2)- macrocytic anemia at least since 09/08/2013 (normal CBC on 11/28/2012), MCV now as high as 115, low retics, marrow M:E ratio 15:1 in 05/2016, erythroid hypoplasia 3)- MGUS serum IgG lambda 0.4 g/dL in 07/2014, follow annually 4)- 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 weekly -03/29/2017 increase dose 25 mg po qweek -07/19/2017 increase mtx 30 mg po qweek, stable dose since then   HISTORY OF PRESENTING ILLNESS:  Tommy Peterson is a wonderful 79 y.o. male who has been referred to [place] by Dr [name] for evaluation and management of Large Granular Lymphocytic Leukemia. The pt reports that he is doing well overall.  Pt is here as he is moving his care because his previous Oncologist, Dr. [name], has changed practices. The pt reports that he needed 2 pints of blood every 2 weeks for a year beginning in 2014. Pt has never needed to any erythropoeitin injections. His Hgb has been holding higher  lately, around 10-11. It has been about 7 months since pt has needed his last blood transfusion. Pt has been taking 30 mg of Methotrexate once per week and denies any issues with that dosage. He has continued taking 1 mg Folic Acid once per day. He is also taking a daily multivitamin.   Pt was previously walking a lot but had to slow down due to fatigue. Pt will need a surgery to repair his left inguinal hernia.   In 2017 pt had a C. Diff infection. Pt had recurrent UTI infections in 2019. His last hospitalization for infections was in January 2020. There were no kidney stones or other reasons for repeat infections. Pt denies any pneumonia infections. Pt has had Diabetes for 40+ years and it is currently well controlled. Pt was never given a direct reason for chronic diarrhea but was placed on Budesonide which stopped his symptoms. Pt is currently taking 3 mg twice per week. He notes that he has been experiencing some urinary frequency, but does feel like he is completely emptying his bladder when he uses the restroom. Pt has seen a Urologist who gave him some medication that caused him to urinate up to 30 times per day. He has tried Flomax previously, and still has some at home, but notes that it does not help much. Pt is following with a Dermatologist , Dr. Rhona Raider, for his skin Squamous Cell Carcinoma. He is currently using Efudex to treat. He was having some night sweats about a year ago, but has had none recently.   Most recent lab results (08/28/2019) of CBC w/diff and CMP is as follows: all values are WNL except for Hgb at  11.8, HCT at 35.1, MCV at 122, MCH at 41.1, RDW at 2.87, RDW at 16.0, Abs Immature Granulocyte at 0.11K, Immature Granulocyte Rel at 1.2, Mono Abs at 1.0K, Creatinine at 0.5, Glucose at 199.  On review of systems, pt reports constipation, fatigue and denies diarrhea, unexpected weight loss, fevers, chills, night sweats, abdominal pain, leg swelling and any other symptoms.    On PMHx the pt reports BPH, Chronic Diarrhea, Chronic Large Granular Lymphocytic Leukemia, Collagenous Collitis, Clostridium defficile colitis, Chronic UTIs, Transfusion-dependant anemia, Type 2 diabetes mellitus.  INTERVAL HISTORY:   Tommy Peterson is a wonderful 79 y.o. male who is here for evaluation and management of Large Granular Lymphocytic Leukemia. The patient's last visit with [place] was on 03/24/2020. The pt reports that he is doing well overall.  The pt reports COVID-19 infection and is gradually recovering from this.  No overt shortness of breath but notes fatigue.  Further fevers.  Notes that his diarrhea has been acting up and he has been taking his oral steroids for this as per his primary care physician. He continues to be off the methotrexate.  Labs today show mild anemia with hemoglobin of 10.1 and a normal white count of 7.6k and normal platelets of 236k.  RBC folate within normal limits, elevated ferritin at 1460 with iron saturation of 87%. B12 levels adequate.    SURGICAL HISTORY: Past Surgical History:  Procedure Laterality Date  . BONE MARROW BIOPSY Right 05-24-2016;  08-20-2014  . CARDIOVASCULAR STRESS TEST  11-26-2012   [name]  @ [place] ([place]   normal nuclear study w/ no ischemia/  normal LV function and wall motion , ef 60% (find in care everywhere,epic)  . CATARACT EXTRACTION W/ INTRAOCULAR LENS  IMPLANT, BILATERAL  2015  . CYSTOSCOPY WITH LITHOLAPAXY N/A 12/25/2017   Procedure: CYSTOSCOPY WITH LITHOLAPAXY AND FULGERATION;  Surgeon: [name], MD;  Location: [facility];  Service: Urology;  Laterality: N/A;  . INGUINAL HERNIA REPAIR Right 1980s  . LOOP RECORDER INSERTION N/A 04/10/2018   Procedure: LOOP RECORDER INSERTION;  Surgeon: [name], MD;  Location: [facility];  Service: Cardiovascular;  Laterality: N/A;  . PENILE PROSTHESIS IMPLANT  12-02-2015   dr [name] @ [facility] in [place]  .  SHOULDER SURGERY Left 08/2016  . TONSILLECTOMY  child  . TRANSURETHRAL RESECTION OF PROSTATE  2009   AND REPAIR RECURRENT RIGHT INGUINAL HERNIA    SOCIAL HISTORY: Social History   Socioeconomic History  . Marital status: Widowed    Spouse name: engaged  . Number of children: 8  . Years of education: college-2  . Highest education level: Not on file  Occupational History  . Occupation: Press photographer  . 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 Smoking status: Former Smoker    Years: 20.00    Types: Cigarettes    Quit date: 05/04/1996    Years since quitting: 24.0  . Smokeless tobacco: Never Used  Vaping Use  . Vaping Use: Never used  Substance and Sexual Activity  . Alcohol use: Yes    Comment: 1-2 glasses of wine every other day  . Drug use: No  . Sexual activity: Never  Other Topics Concern  . Not on file  Social History Narrative  . Not on file   Social Determinants of Health   Financial Resource Strain:   . Difficulty of Paying Living Expenses: Not on file  Food Insecurity:   .  Worried About Charity fundraiser in the Last Year: Not on file  . Ran Out of Food in the Last Year: Not on file  Transportation Needs:   . Lack of Transportation (Medical): Not on file  . Lack of Transportation (Non-Medical): Not on file  Physical Activity:   . Days of Exercise per Week: Not on file  . Minutes of Exercise per Session: Not on file  Stress:   . Feeling of Stress : Not on file  Social Connections:   . Frequency of Communication with Friends and Family: Not on file  . Frequency of Social Gatherings with Friends and Family: Not on file  . Attends Religious Services: Not on file  . Active Member of Clubs or Organizations: Not on file  . Attends Archivist Meetings: Not on file  . Marital Status: Not on file  Intimate Partner Violence:   . Fear of Current or Ex-Partner: Not on file  . Emotionally Abused: Not on file  . Physically Abused: Not on file  .  Sexually Abused: Not on file    FAMILY HISTORY: Family History  Problem Relation Age of Onset  . Diabetes Mother   . Heart failure Mother     ALLERGIES:  is allergic to bee venom, metformin and related, and sulfamethoxazole-trimethoprim.  MEDICATIONS:  Current Outpatient Medications  Medication Sig Dispense Refill  . acetaminophen (TYLENOL) 325 MG tablet Take 2 tablets (650 mg total) by mouth every 6 (six) hours as needed for mild pain (or Fever >/= 101).    Marland Kitchen albuterol (VENTOLIN HFA) 108 (90 Base) MCG/ACT inhaler Inhale 2 puffs into the lungs every 6 (six) hours. 8 g 1  . b complex vitamins capsule Take 1 capsule by mouth daily.    . budesonide (ENTOCORT EC) 3 MG 24 hr capsule Take 6 mg by mouth daily.    . Cranberry 400 MG TABS Take 1 tablet by mouth daily.     . D-Mannose 500 MG CAPS Take 500 mg by mouth daily.    . folic acid (FOLVITE) 1 MG tablet Take 1 mg by mouth daily.    Marland Kitchen guaiFENesin-dextromethorphan (ROBITUSSIN DM) 100-10 MG/5ML syrup Take 10 mLs by mouth every 4 (four) hours as needed for cough. 118 mL 0  . Insulin Glargine-Lixisenatide (SOLIQUA) 100-33 UNT-MCG/ML SOPN Inject 28 Units into the skin at bedtime.     Marland Kitchen NOVOLOG FLEXPEN 100 UNIT/ML FlexPen Inject 4 Units into the skin in the morning and at bedtime.     . pioglitazone (ACTOS) 15 MG tablet Take 15 mg by mouth every morning.     . polyethylene glycol (MIRALAX / GLYCOLAX) 17 g packet Take 17 g by mouth 2 (two) times daily. 14 each 0  . vitamin B-12 (CYANOCOBALAMIN) 100 MCG tablet Take 100 mcg by mouth daily.     No current facility-administered medications for this visit.    REVIEW OF SYSTEMS:   A 10+ POINT REVIEW OF SYSTEMS WAS OBTAINED including neurology, dermatology, psychiatry, cardiac, respiratory, lymph, extremities, GI, GU, Musculoskeletal, constitutional, breasts, reproductive, HEENT.  All pertinent positives are noted in the HPI.  All others are negative.   PHYSICAL EXAMINATION: ECOG PERFORMANCE  STATUS: 1 - Symptomatic but completely ambulatory  . Vitals:   05/07/20 1227  BP: 131/68  Pulse: 86  Resp: 18  Temp: 97.8 F (36.6 C)  SpO2: 99%   Filed Weights   05/07/20 1227  Weight: 165 lb 9.6 oz (75.1 kg)   .Body mass index  is 24.1 kg/m.   NAD GENERAL:alert, in no acute distress and comfortable SKIN: no acute rashes, no significant lesions EYES: conjunctiva are pink and non-injected, sclera anicteric OROPHARYNX: MMM, no exudates, no oropharyngeal erythema or ulceration NECK: supple, no JVD LYMPH:  no palpable lymphadenopathy in the cervical, axillary or inguinal regions LUNGS: clear to auscultation b/l with normal respiratory effort HEART: regular rate & rhythm ABDOMEN:  normoactive bowel sounds , non tender, not distended. No palpable hepatosplenomegaly.  Extremity: no pedal edema PSYCH: alert & oriented x 3 with fluent speech NEURO: no focal motor/sensory deficits  LABORATORY DATA:  I have reviewed the data as listed  . CBC Latest Ref Rng & Units 05/07/2020 05/07/2020 04/17/2020  WBC 4.0 - 10.5 K/uL 7.6 - 16.3(H)  Hemoglobin 13.0 - 17.0 g/dL 10.1(L) - 11.8(L)  Hematocrit 37.5 - 51.0 % 26.7(L) 30.9(L) 35.7(L)  Platelets 150 - 400 K/uL 236 - 276    . CMP Latest Ref Rng & Units 04/17/2020 04/10/2020 04/09/2020  Glucose 70 - 99 mg/dL 324(H) 167(H) 149(H)  BUN 8 - 23 mg/dL $Remove'15 17 17  'WhMwpFm$ Creatinine 0.61 - 1.24 mg/dL 0.52(L) 0.48(L) 0.44(L)  Sodium 135 - 145 mmol/L 135 138 139  Potassium 3.5 - 5.1 mmol/L 4.1 3.9 4.4  Chloride 98 - 111 mmol/L 102 105 107  CO2 22 - 32 mmol/L $RemoveB'24 26 24  'trHBnfeX$ Calcium 8.9 - 10.3 mg/dL 8.1(L) 8.2(L) 8.2(L)  Total Protein 6.5 - 8.1 g/dL 6.1(L) 5.7(L) 5.6(L)  Total Bilirubin 0.3 - 1.2 mg/dL 1.4(H) 0.8 0.8  Alkaline Phos 38 - 126 U/L 58 50 47  AST 15 - 41 U/L 21 32 34  ALT 0 - 44 U/L 40 52(H) 43   09/28/2016 Flow Cytometry:   RADIOGRAPHIC STUDIES: I have personally reviewed the radiological images as listed and agreed with the findings in the  report. DG Abd Portable 1 View  Result Date: 04/17/2020 CLINICAL DATA:  Constipation for 10 days, COVID-19 positive EXAM: X-RAY ABDOMEN 1 VIEW COMPARISON:  Acute abdominal series 11/21/2017 FINDINGS: Rectal stool ball is noted up to 7.8 cm in diameter. There are several borderline air distended loops of small bowel in the upper abdomen which are nonspecific. No suspicious calcifications over the gallbladder fossa or urinary tract. Phleboliths seen in the pelvis. Metallic coil marker projects over the midline pelvis as well. Mild degenerative changes in the spine and hips. No acute or suspicious osseous lesions. IMPRESSION: 1. Rectal stool ball is noted up to 7.8 cm in diameter, can be seen in the setting of impaction/constipation. 2. Several borderline air distended loops of small bowel in the upper abdomen are nonspecific. Could correlate for symptoms of developing obstruction or constipation as above. Electronically Signed   By: Lovena Le M.D.   On: 04/17/2020 21:29    ASSESSMENT & PLAN:   79 yo with   1) T cell large granular leukemia s/p transfusion dependent anemia - previous blood transfusion dependent and was last on MTX $Remo'30mg'hQfyf$  po weekly a few months ago 2) IgG Lambda MGUS 3) recent COVID-19 infection PLAN: -Pt's Large Granular Lymphocytic Leukemia appears stable at this time and his hemoglobin is stable at 10.1. -At this point the patient is gradually recovering from his recent COVID-19 infection.  His hemoglobin has been stable with no other new significant cytopenias. -Advised pt to continue to hold Methotrexate to prevent worsening anemia or other cytopenias at this time. -Continue 2 mg Folic acid and a B-complex daily.    FOLLOW UP: RTC with Dr  [place] Pelto with labs in 6 weeks   The total time spent in the appt was 20 minutes and more than 50% was on counseling and direct patient cares.  All of the patient's questions were answered with apparent satisfaction. The patient knows to  call the clinic with any problems, questions or concerns.   Sullivan Lone MD [place] AAHIVMS [facility] Hematology/Oncology Physician [facility]  (Office):       [phone] (Work cell):  [phone] (Fax):           [phone]  05/07/2020 8:51 AM  I, Yevette Edwards, am acting as a scribe for Dr. Sullivan Lone.   .I have reviewed the above documentation for accuracy and completeness, and I agree with the above. Brunetta Genera MD

## 2020-05-09 LAB — FOLATE RBC
Folate, Hemolysate: 550 ng/mL
Folate, RBC: 2060 ng/mL (ref >498)
Hematocrit: 26.7 % — ABNORMAL LOW (ref 37.5–51.0)

## 2020-05-10 DIAGNOSIS — Z87440 Personal history of urinary (tract) infections: Secondary | ICD-10-CM | POA: Diagnosis not present

## 2020-05-14 ENCOUNTER — Ambulatory Visit
Admission: RE | Admit: 2020-05-14 | Discharge: 2020-05-14 | Disposition: A | Discharge status: Home / Self Care | Payer: Medicare HMO | Source: Ambulatory Visit | Attending: Surgery | Admitting: Surgery

## 2020-05-14 DIAGNOSIS — K4090 Unilateral inguinal hernia, without obstruction or gangrene, not specified as recurrent: Secondary | ICD-10-CM

## 2020-05-14 DIAGNOSIS — N400 Enlarged prostate without lower urinary tract symptoms: Secondary | ICD-10-CM | POA: Diagnosis not present

## 2020-05-14 DIAGNOSIS — I700 Atherosclerosis of aorta: Secondary | ICD-10-CM | POA: Diagnosis not present

## 2020-05-14 DIAGNOSIS — K3189 Other diseases of stomach and duodenum: Secondary | ICD-10-CM | POA: Diagnosis not present

## 2020-05-14 DIAGNOSIS — I517 Cardiomegaly: Secondary | ICD-10-CM | POA: Diagnosis not present

## 2020-05-14 IMAGING — CT CT ABD-PELV W/ CM
2 of 5 series · 13 of 46 positions shown, 15 images · IV contrast (iopamidol)
Comparison: CT dated [DATE]

CLINICAL DATA: Epigastric pain.  History of inguinal hernia.

EXAM:
CT ABDOMEN AND PELVIS WITH CONTRAST
TECHNIQUE: Multidetector CT imaging of the abdomen and pelvis was performed
using the standard protocol following bolus administration of
intravenous contrast.
CONTRAST:  100mL [I4] IOPAMIDOL ([I4]) INJECTION 61%

[Series 2: abd pelvis 5.00 br40 s3 axial · axial · 0.70mm/px · z∈[+1108,+1543]mm · 10 of 99 slices shown, 12 images]
[im 6/99  soft-tissue]
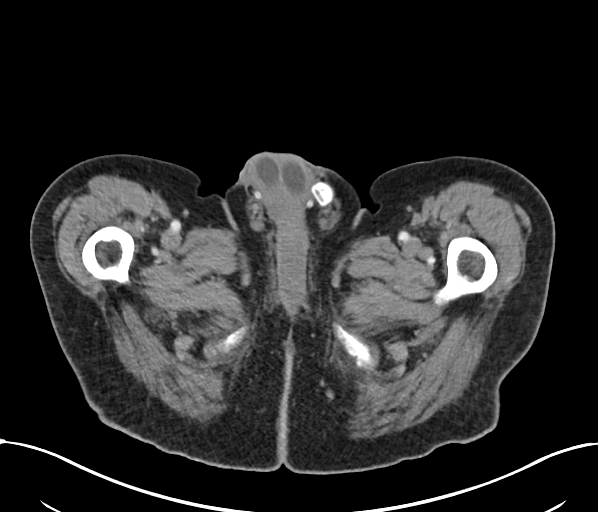
[im 6/99  bone]
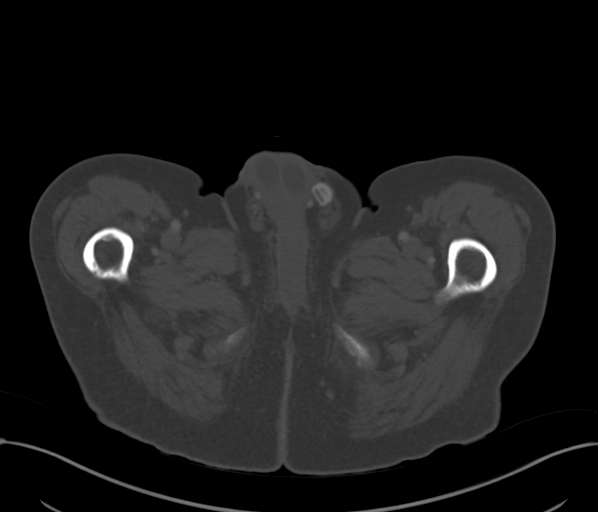
[im 16/99  soft-tissue]
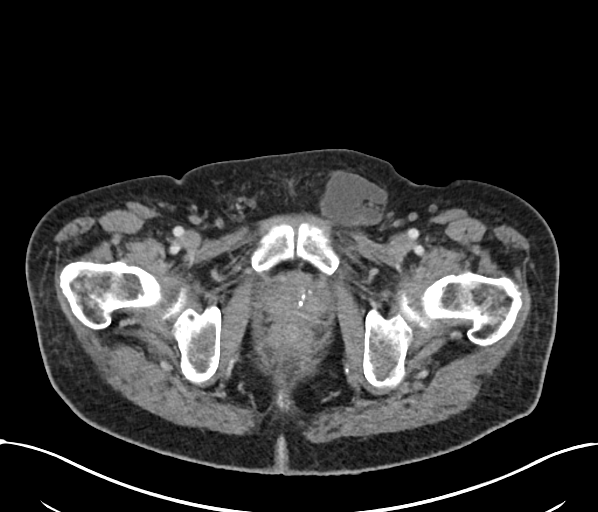
[im 26/99  soft-tissue]
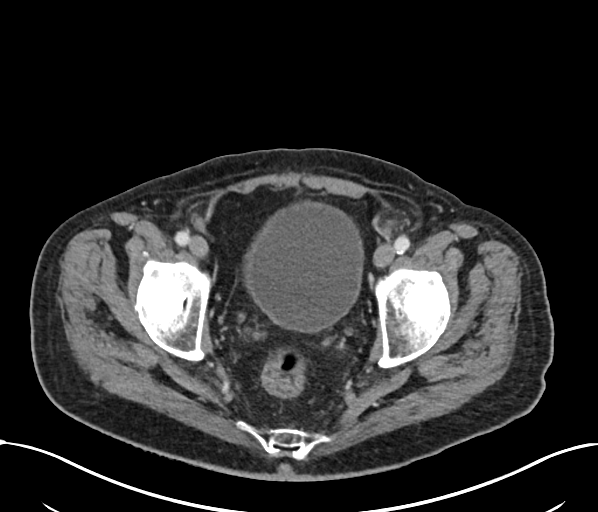
[im 37/99  soft-tissue]
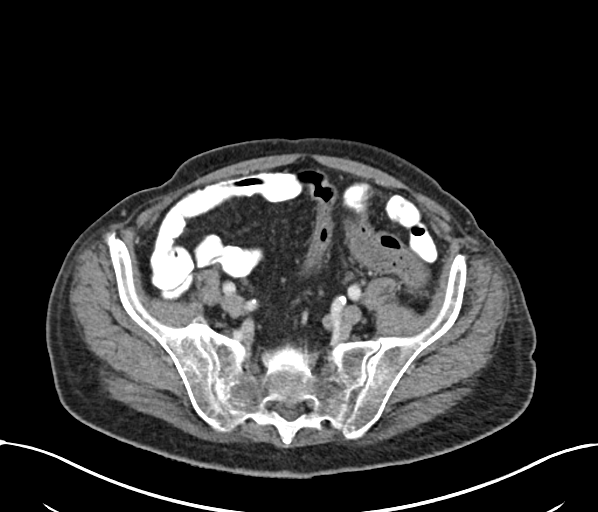
[im 47/99  soft-tissue]
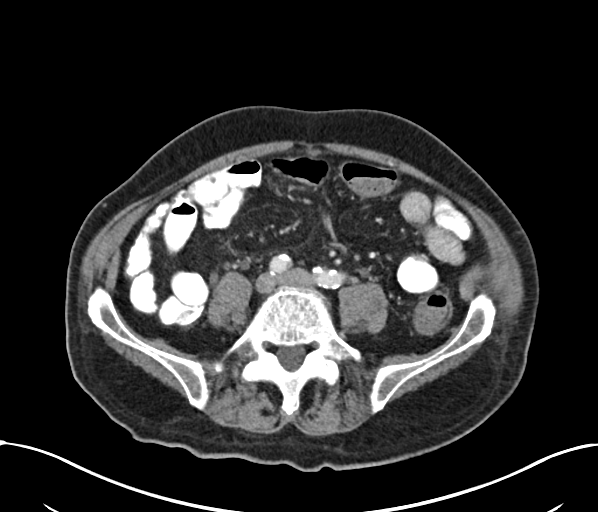
[im 52/99  soft-tissue]
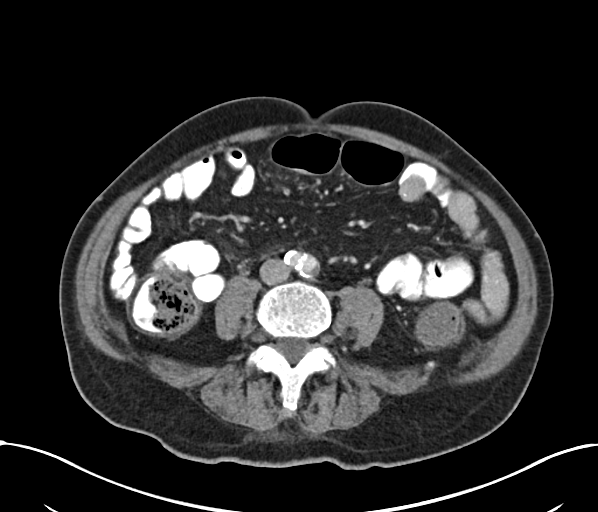
[im 62/99  soft-tissue]
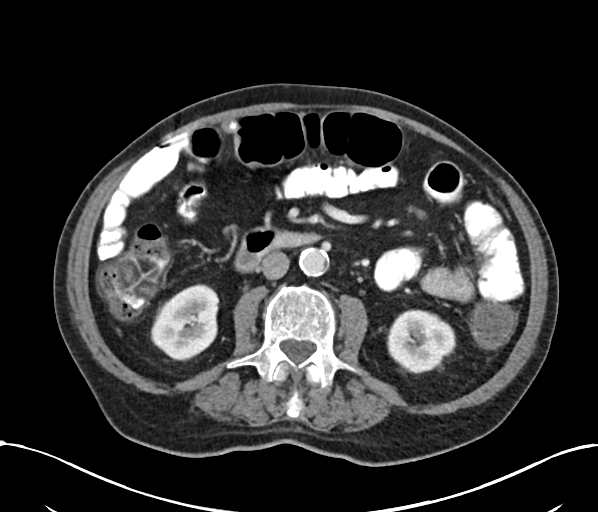
[im 73/99  soft-tissue]
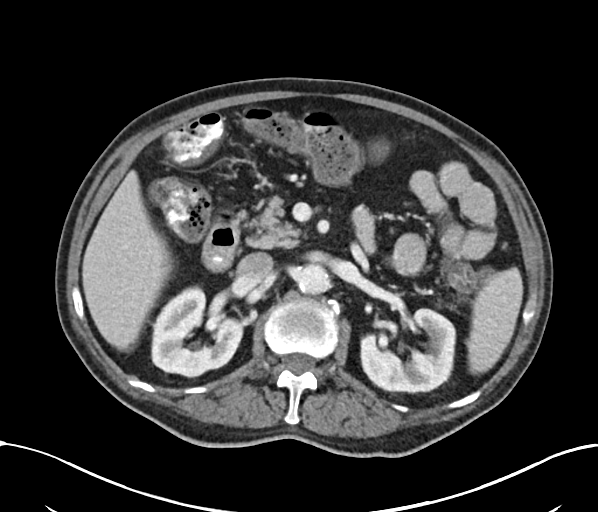
[im 83/99  soft-tissue]
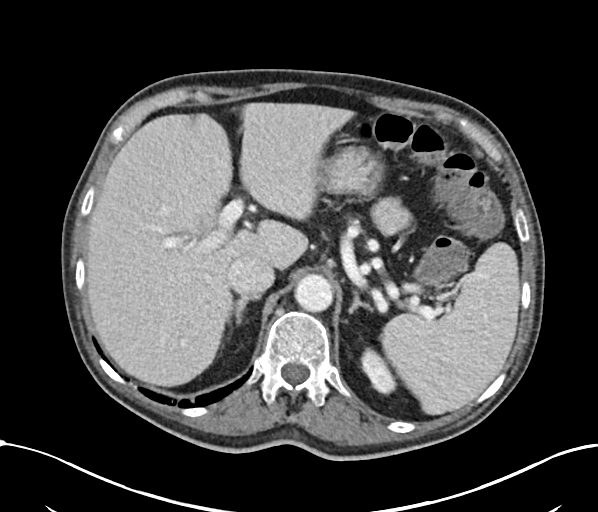
[im 83/99  bone]
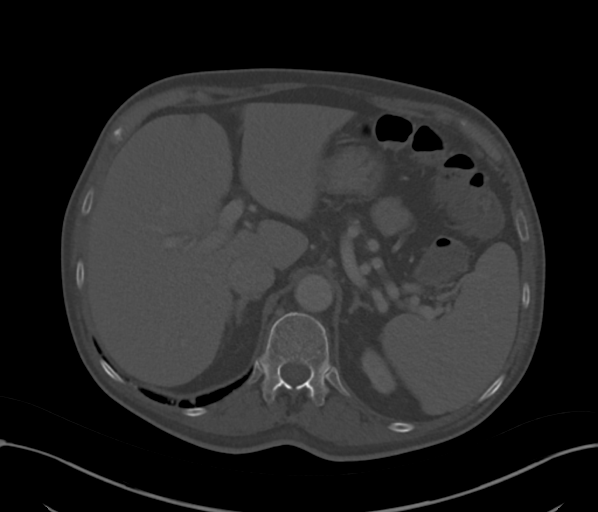
[im 93/99  soft-tissue]
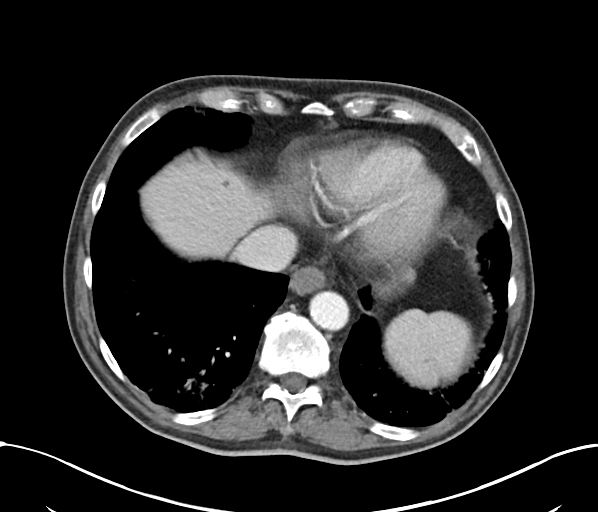

[Series 6: abd pelvis 2.00 br40 s3 cor · coronal · 0.73mm/px · 3 of 160 slices shown]
[im 54/160  soft-tissue]
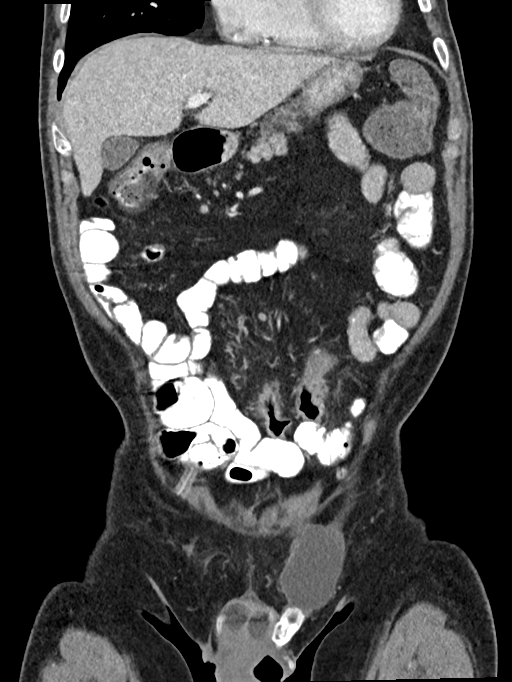
[im 71/160  soft-tissue]
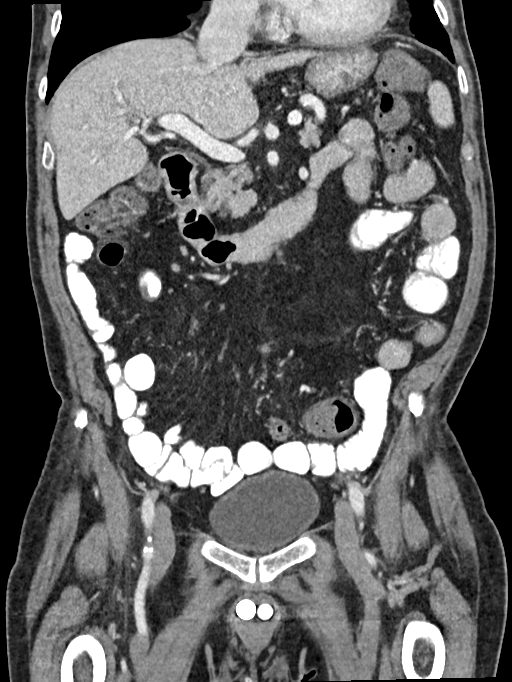
[im 89/160  soft-tissue]
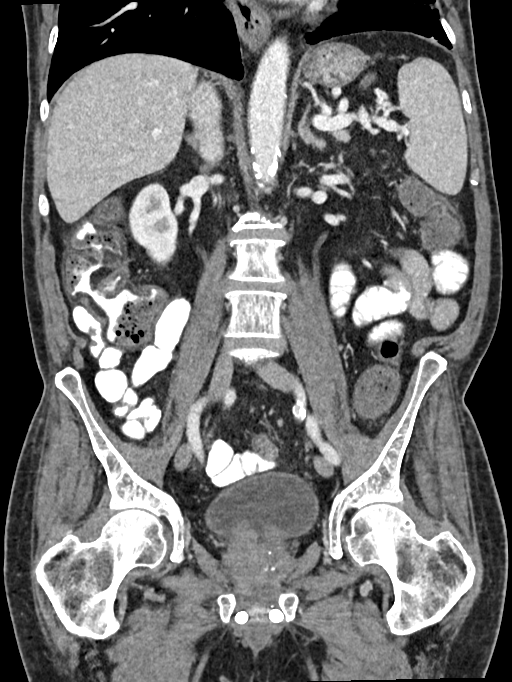

[13 of 46 positions shown; findings below may reference images not displayed]

FINDINGS: Lower chest: Bibasilar airspace opacities are again noted. The
opacities on the left have improved since prior study. The right
basilar opacities are slightly improved from prior study.The heart
is enlarged.

Hepatobiliary: The liver is normal. Normal gallbladder.There is no
biliary ductal dilation.

Pancreas: Normal contours without ductal dilatation. No
peripancreatic fluid collection.

Spleen: The spleen is enlarged measuring 13.4 cm craniocaudad. This
is slightly increased since [I4].

Adrenals/Urinary Tract:

--Adrenal glands: Unremarkable.

--Right kidney/ureter: No hydronephrosis or radiopaque kidney
stones.

--Left kidney/ureter: No hydronephrosis or radiopaque kidney stones.

--Urinary bladder: Unremarkable.

Stomach/Bowel:

--Stomach/Duodenum: There is wall thickening of the distal
esophagus. The visualized stomach is unremarkable.

--Small bowel: Unremarkable.

--Colon: Unremarkable.

--Appendix: Normal.

Vascular/Lymphatic: Atherosclerotic calcification is present within
the non-aneurysmal abdominal aorta, without hemodynamically
significant stenosis.

--No retroperitoneal lymphadenopathy.

--No mesenteric lymphadenopathy.

--No pelvic or inguinal lymphadenopathy.

Reproductive: Prostate gland is enlarged. There is a penile
prosthesis in place. The reservoir has migrated inferiorly and is
located within the left inguinal canal. This has slightly worsened
since the prior study.

Other: No ascites or free air. The abdominal wall is normal.

Musculoskeletal. No acute displaced fractures.
IMPRESSION: 1. No acute intra-abdominal abnormality detected.
2. There is wall thickening of the distal esophagus. Correlate for
signs and symptoms of esophagitis.
3. There is a penile prosthesis in place. The reservoir has migrated
inferiorly and is located within the left inguinal canal.
4. Splenomegaly, slightly increased since [DATE]. Cardiomegaly.
6. Persistent chronic bibasilar airspace opacities favored to
represent areas of atelectasis or sequela of chronic aspiration.

Aortic Atherosclerosis ([I4]-[I4]).

## 2020-05-14 MED ORDER — IOPAMIDOL (ISOVUE-300) INJECTION 61%
100.0000 mL | Freq: Once | INTRAVENOUS | Status: AC | PRN
  Administered 2020-05-14: 100 mL via INTRAVENOUS

## 2020-05-17 DIAGNOSIS — K52831 Collagenous colitis: Secondary | ICD-10-CM | POA: Diagnosis not present

## 2020-05-17 DIAGNOSIS — R351 Nocturia: Secondary | ICD-10-CM | POA: Diagnosis not present

## 2020-05-17 DIAGNOSIS — R110 Nausea: Secondary | ICD-10-CM | POA: Diagnosis not present

## 2020-05-17 DIAGNOSIS — R1084 Generalized abdominal pain: Secondary | ICD-10-CM | POA: Diagnosis not present

## 2020-05-17 DIAGNOSIS — R197 Diarrhea, unspecified: Secondary | ICD-10-CM | POA: Diagnosis not present

## 2020-05-17 NOTE — Progress Notes (Signed)
I called and left a voicemail telling him that he had no hernia but he needs to see urology re: his prosthetic.

## 2020-05-18 DIAGNOSIS — N312 Flaccid neuropathic bladder, not elsewhere classified: Secondary | ICD-10-CM | POA: Diagnosis not present

## 2020-05-18 DIAGNOSIS — R3915 Urgency of urination: Secondary | ICD-10-CM | POA: Diagnosis not present

## 2020-05-18 DIAGNOSIS — N5201 Erectile dysfunction due to arterial insufficiency: Secondary | ICD-10-CM | POA: Diagnosis not present

## 2020-05-18 DIAGNOSIS — R351 Nocturia: Secondary | ICD-10-CM | POA: Diagnosis not present

## 2020-05-26 DIAGNOSIS — R1084 Generalized abdominal pain: Secondary | ICD-10-CM | POA: Diagnosis not present

## 2020-05-26 DIAGNOSIS — R110 Nausea: Secondary | ICD-10-CM | POA: Diagnosis not present

## 2020-05-26 DIAGNOSIS — R197 Diarrhea, unspecified: Secondary | ICD-10-CM | POA: Diagnosis not present

## 2020-05-26 DIAGNOSIS — R351 Nocturia: Secondary | ICD-10-CM | POA: Diagnosis not present

## 2020-05-26 DIAGNOSIS — K52831 Collagenous colitis: Secondary | ICD-10-CM | POA: Diagnosis not present

## 2020-05-28 DIAGNOSIS — J029 Acute pharyngitis, unspecified: Secondary | ICD-10-CM | POA: Diagnosis not present

## 2020-06-02 DIAGNOSIS — L570 Actinic keratosis: Secondary | ICD-10-CM | POA: Diagnosis not present

## 2020-06-02 DIAGNOSIS — C44311 Basal cell carcinoma of skin of nose: Secondary | ICD-10-CM | POA: Diagnosis not present

## 2020-06-10 DIAGNOSIS — E119 Type 2 diabetes mellitus without complications: Secondary | ICD-10-CM | POA: Diagnosis not present

## 2020-06-14 DIAGNOSIS — N528 Other male erectile dysfunction: Secondary | ICD-10-CM | POA: Diagnosis not present

## 2020-06-15 ENCOUNTER — Telehealth: Payer: Self-pay | Admitting: Hematology

## 2020-06-15 NOTE — Telephone Encounter (Signed)
Scheduled per 10/01 los, patient has been called and notified.

## 2020-06-17 DIAGNOSIS — C91Z0 Other lymphoid leukemia not having achieved remission: Secondary | ICD-10-CM | POA: Diagnosis not present

## 2020-06-17 DIAGNOSIS — K529 Noninfective gastroenteritis and colitis, unspecified: Secondary | ICD-10-CM | POA: Diagnosis not present

## 2020-06-17 DIAGNOSIS — Z87440 Personal history of urinary (tract) infections: Secondary | ICD-10-CM | POA: Diagnosis not present

## 2020-06-17 DIAGNOSIS — E1140 Type 2 diabetes mellitus with diabetic neuropathy, unspecified: Secondary | ICD-10-CM | POA: Diagnosis not present

## 2020-06-17 DIAGNOSIS — Z6825 Body mass index (BMI) 25.0-25.9, adult: Secondary | ICD-10-CM | POA: Diagnosis not present

## 2020-06-17 DIAGNOSIS — D472 Monoclonal gammopathy: Secondary | ICD-10-CM | POA: Diagnosis not present

## 2020-06-17 DIAGNOSIS — Z794 Long term (current) use of insulin: Secondary | ICD-10-CM | POA: Diagnosis not present

## 2020-06-18 ENCOUNTER — Other Ambulatory Visit: Payer: Self-pay

## 2020-06-18 ENCOUNTER — Inpatient Hospital Stay: Payer: Medicare HMO | Attending: Hematology

## 2020-06-18 ENCOUNTER — Other Ambulatory Visit: Payer: Self-pay | Admitting: *Deleted

## 2020-06-18 ENCOUNTER — Inpatient Hospital Stay (HOSPITAL_BASED_OUTPATIENT_CLINIC_OR_DEPARTMENT_OTHER): Payer: Medicare HMO | Admitting: Hematology

## 2020-06-18 VITALS — BP 124/48 | HR 50 | Temp 98.9°F | Resp 18 | Ht 69.5 in | Wt 168.9 lb

## 2020-06-18 DIAGNOSIS — C91Z0 Other lymphoid leukemia not having achieved remission: Secondary | ICD-10-CM | POA: Insufficient documentation

## 2020-06-18 DIAGNOSIS — D649 Anemia, unspecified: Secondary | ICD-10-CM

## 2020-06-18 DIAGNOSIS — D472 Monoclonal gammopathy: Secondary | ICD-10-CM | POA: Diagnosis not present

## 2020-06-18 DIAGNOSIS — Z79899 Other long term (current) drug therapy: Secondary | ICD-10-CM | POA: Diagnosis not present

## 2020-06-18 LAB — SAMPLE TO BLOOD BANK

## 2020-06-18 LAB — CBC WITH DIFFERENTIAL/PLATELET
Abs Immature Granulocytes: 0.1 10*3/uL — ABNORMAL HIGH (ref 0.00–0.07)
Basophils Absolute: 0 10*3/uL (ref 0.0–0.1)
Basophils Relative: 0 %
Eosinophils Absolute: 0 10*3/uL (ref 0.0–0.5)
Eosinophils Relative: 0 %
HCT: 19.7 % — ABNORMAL LOW (ref 39.0–52.0)
Hemoglobin: 6.6 g/dL — CL (ref 13.0–17.0)
Immature Granulocytes: 1 %
Lymphocytes Relative: 59 %
Lymphs Abs: 8.5 10*3/uL — ABNORMAL HIGH (ref 0.7–4.0)
MCH: 37.7 pg — ABNORMAL HIGH (ref 26.0–34.0)
MCHC: 33.5 g/dL (ref 30.0–36.0)
MCV: 112.6 fL — ABNORMAL HIGH (ref 80.0–100.0)
Monocytes Absolute: 1.4 10*3/uL — ABNORMAL HIGH (ref 0.1–1.0)
Monocytes Relative: 10 %
Neutro Abs: 4.3 10*3/uL (ref 1.7–7.7)
Neutrophils Relative %: 30 %
Platelets: 328 10*3/uL (ref 150–400)
RBC: 1.75 MIL/uL — ABNORMAL LOW (ref 4.22–5.81)
RDW: 18.9 % — ABNORMAL HIGH (ref 11.5–15.5)
WBC: 14.3 10*3/uL — ABNORMAL HIGH (ref 4.0–10.5)
nRBC: 0 % (ref 0.0–0.2)

## 2020-06-18 LAB — RETICULOCYTES
Immature Retic Fract: 3.4 % (ref 2.3–15.9)
RBC.: 1.75 MIL/uL — ABNORMAL LOW (ref 4.22–5.81)
Retic Ct Pct: <0.4 % — ABNORMAL LOW (ref 0.4–3.1)

## 2020-06-18 LAB — PREPARE RBC (CROSSMATCH)

## 2020-06-18 NOTE — Progress Notes (Signed)
HEMATOLOGY/ONCOLOGY CLINIC NOTE  Date of Service: 06/18/2020  Patient Care Team: [facility] [place] as PCP - General (Rheumatology) Collier Bullock, MD as Referring Physician (Hematology and Oncology) [name], DO as Referring Physician (Family Medicine)  CHIEF COMPLAINTS/PURPOSE OF CONSULTATION:  Large Granular Lymphocytic Leukemia  Hem/Onc History  1)- chronic leukocytosis (neutrophilia and absolute lymphocytosis), peripheral blood lymphocytes about 5000, about 1/4 of cells have LGL morphology 2)- macrocytic anemia at least since 09/08/2013 (normal CBC on 11/28/2012), MCV now as high as 115, low retics, marrow M:E ratio 15:1 in 05/2016, erythroid hypoplasia 3)- MGUS serum IgG lambda 0.4 g/dL in 07/2014, follow annually 4)- 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 weekly -03/29/2017 increase dose 25 mg po qweek -07/19/2017 increase mtx 30 mg po qweek, stable dose since then   HISTORY OF PRESENTING ILLNESS:  Tommy Peterson is a wonderful 79 y.o. male who has been referred to [place] by Dr [name] for evaluation and management of Large Granular Lymphocytic Leukemia. The pt reports that he is doing well overall.  Pt is here as he is moving his care because his previous Oncologist, Dr. [name], has changed practices. The pt reports that he needed 2 pints of blood every 2 weeks for a year beginning in 2014. Pt has never needed to any erythropoeitin injections. His Hgb has been holding  higher lately, around 10-11. It has been about 7 months since pt has needed his last blood transfusion. Pt has been taking 30 mg of Methotrexate once per week and denies any issues with that dosage. He has continued taking 1 mg Folic Acid once per day. He is also taking a daily multivitamin.   Pt was previously walking a lot but had to slow down due to fatigue. Pt will need a surgery to repair his left inguinal hernia.   In 2017 pt had a C. Diff infection. Pt had recurrent UTI infections in 2019. His last hospitalization for infections was in January 2020. There were no kidney stones or other reasons for repeat infections. Pt denies any pneumonia infections. Pt has had Diabetes for 40+ years and it is currently well controlled. Pt was never given a direct reason for chronic diarrhea but was placed on Budesonide which stopped his symptoms. Pt is currently taking 3 mg twice per week. He notes that he has been experiencing some urinary frequency, but does feel like he is completely emptying his bladder when he uses the restroom. Pt has seen a Urologist who gave him some medication that caused him to urinate up to 30 times per day. He has tried Flomax previously, and still has some at home, but notes that it does not help much. Pt is following with a Dermatologist , Dr. Rhona Raider, for his skin Squamous Cell Carcinoma. He is currently using Efudex to treat. He was having some night sweats about a year ago, but has had none recently.   Most recent lab results (08/28/2019) of CBC w/diff and CMP is as follows: all values are WNL except for Hgb at  11.8, HCT at 35.1, MCV at 122, MCH at 41.1, RDW at 2.87, RDW at 16.0, Abs Immature Granulocyte at 0.11K, Immature Granulocyte Rel at 1.2, Mono Abs at 1.0K, Creatinine at 0.5, Glucose at 199.  On review of systems, pt reports constipation, fatigue and denies diarrhea, unexpected weight loss, fevers, chills, night sweats, abdominal pain, leg swelling and any other  symptoms.   On PMHx the pt reports BPH, Chronic Diarrhea, Chronic Large Granular Lymphocytic Leukemia, Collagenous Collitis, Clostridium defficile colitis, Chronic UTIs, Transfusion-dependant anemia, Type 2 diabetes mellitus.  INTERVAL HISTORY:   Tommy Peterson is a wonderful 79 y.o. male who is here for evaluation and management of Large Granular Lymphocytic Leukemia. The patient's last visit with [place] was on 05/07/2020. The pt reports that he is doing well overall.  The pt reports that he is eating well, but has been very weak and fatigued. Pt was having a large volume of loose stools that were uncontrolled. His bowel movements have started to become firm and less frequent, but he continues having bowel incontinence. Pt was not able to complete stool sample testing with Dr. Benson Norway as his stools were too solid. Pt has a Colonoscopy & Endoscopy scheduled for 06/22/20. He is currently on 9 mg Entocort daily. Pt reports that his Hgb was at 7.3 on 11/04.   He is no longer coughing and denies any residual respiratory symptoms from his COVID19 infection.   Lab results today (06/18/20) of CBC w/diff is as follows: all values are WNL except for WBC at 14.3K, RBC at 1.75, Hgb at 6.6, HCT at 19.7, MCV at 112.6, MCH at 37.7, RDW at 18.9, Lymphs Abs at 8.5K, Mono Abs at 1.4K, Abs Immature Granulocytes at 0.10K. 06/18/2020 Retic Ct Pct at <0.4, Retic Ct Abs was not measured, Immature Retic Fract at 3.4  On review of systems, pt reports weakness, fatigue, bowel incontinence and denies blood/mucous in stools, cough, fevers, chills, lightheadedness, dizziness and any other symptoms.   SURGICAL HISTORY: Past Surgical History:  Procedure Laterality Date  . BONE MARROW BIOPSY Right 05-24-2016;  08-20-2014  . CARDIOVASCULAR STRESS TEST  11-26-2012   [name]  @ [facility] [place]   normal nuclear study w/ no ischemia/  normal LV function and wall motion , ef 60% (find in care everywhere,epic)  .  CATARACT EXTRACTION W/ INTRAOCULAR LENS  IMPLANT, BILATERAL  2015  . CYSTOSCOPY WITH LITHOLAPAXY N/A 12/25/2017   Procedure: CYSTOSCOPY WITH LITHOLAPAXY AND FULGERATION;  Surgeon: [name], MD;  Location: [facility];  Service: Urology;  Laterality: N/A;  . INGUINAL HERNIA REPAIR Right 1980s  . LOOP RECORDER INSERTION N/A 04/10/2018   Procedure: LOOP RECORDER INSERTION;  Surgeon: [name], MD;  Location: [facility];  Service: Cardiovascular;  Laterality: N/A;  . PENILE PROSTHESIS IMPLANT  12-02-2015   dr [name] @ [facility] in [place]  . SHOULDER SURGERY Left 08/2016  . TONSILLECTOMY  child  . TRANSURETHRAL RESECTION OF PROSTATE  2009   AND REPAIR RECURRENT RIGHT INGUINAL HERNIA    SOCIAL HISTORY: Social History   Socioeconomic History  . Marital status: Widowed    Spouse name: engaged  . Number of children: 8  . Years of education: college-2  . Highest education level: Not on file  Occupational History  . Occupation: Press photographer  . 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 Smoking status: Former Smoker    Years: 20.00    Types: Cigarettes  Quit date: 05/04/1996    Years since quitting: 24.1  . Smokeless tobacco: Never Used  Vaping Use  . Vaping Use: Never used  Substance and Sexual Activity  . Alcohol use: Yes    Comment: 1-2 glasses of wine every other day  . Drug use: No  . Sexual activity: Never  Other Topics Concern  . Not on file  Social History Narrative  . Not on file   Social Determinants of Health   Financial Resource Strain:   . Difficulty of Paying Living Expenses: Not on file  Food Insecurity:   . Worried About Charity fundraiser in the Last Year: Not on file  . Ran Out of Food in the Last Year: Not on file  Transportation Needs:   . Lack of Transportation (Medical): Not on file  . Lack of Transportation (Non-Medical): Not on file  Physical Activity:   . Days of Exercise per Week: Not on file  .  Minutes of Exercise per Session: Not on file  Stress:   . Feeling of Stress : Not on file  Social Connections:   . Frequency of Communication with Friends and Family: Not on file  . Frequency of Social Gatherings with Friends and Family: Not on file  . Attends Religious Services: Not on file  . Active Member of Clubs or Organizations: Not on file  . Attends Archivist Meetings: Not on file  . Marital Status: Not on file  Intimate Partner Violence:   . Fear of Current or Ex-Partner: Not on file  . Emotionally Abused: Not on file  . Physically Abused: Not on file  . Sexually Abused: Not on file    FAMILY HISTORY: Family History  Problem Relation Age of Onset  . Diabetes Mother   . Heart failure Mother     ALLERGIES:  is allergic to bee venom, metformin and related, and sulfamethoxazole-trimethoprim.  MEDICATIONS:  Current Outpatient Medications  Medication Sig Dispense Refill  . acetaminophen (TYLENOL) 325 MG tablet Take 2 tablets (650 mg total) by mouth every 6 (six) hours as needed for mild pain (or Fever >/= 101).    Marland Kitchen albuterol (VENTOLIN HFA) 108 (90 Base) MCG/ACT inhaler Inhale 2 puffs into the lungs every 6 (six) hours. 8 g 1  . b complex vitamins capsule Take 1 capsule by mouth daily.    . budesonide (ENTOCORT EC) 3 MG 24 hr capsule Take 6 mg by mouth daily.    . Cranberry 400 MG TABS Take 1 tablet by mouth daily.     . D-Mannose 500 MG CAPS Take 500 mg by mouth daily.    . folic acid (FOLVITE) 1 MG tablet Take 1 mg by mouth daily.    Marland Kitchen guaiFENesin-dextromethorphan (ROBITUSSIN DM) 100-10 MG/5ML syrup Take 10 mLs by mouth every 4 (four) hours as needed for cough. 118 mL 0  . Insulin Glargine-Lixisenatide (SOLIQUA) 100-33 UNT-MCG/ML SOPN Inject 28 Units into the skin at bedtime.     Marland Kitchen NOVOLOG FLEXPEN 100 UNIT/ML FlexPen Inject 4 Units into the skin in the morning and at bedtime.     . pioglitazone (ACTOS) 15 MG tablet Take 15 mg by mouth every morning.     .  polyethylene glycol (MIRALAX / GLYCOLAX) 17 g packet Take 17 g by mouth 2 (two) times daily. 14 each 0  . vitamin B-12 (CYANOCOBALAMIN) 100 MCG tablet Take 100 mcg by mouth daily.     No current facility-administered medications for this visit.  REVIEW OF SYSTEMS:   A 10+ POINT REVIEW OF SYSTEMS WAS OBTAINED including neurology, dermatology, psychiatry, cardiac, respiratory, lymph, extremities, GI, GU, Musculoskeletal, constitutional, breasts, reproductive, HEENT.  All pertinent positives are noted in the HPI.  All others are negative.   PHYSICAL EXAMINATION: ECOG PERFORMANCE STATUS: 1 - Symptomatic but completely ambulatory  . Vitals:   06/18/20 1128  BP: (!) 124/48  Pulse: (!) 50  Resp: 18  Temp: 98.9 F (37.2 C)  SpO2: 100%   Filed Weights   06/18/20 1128  Weight: 168 lb 14.4 oz (76.6 kg)   .Body mass index is 24.58 kg/m.   GENERAL:alert, in no acute distress and comfortable SKIN: no acute rashes, no significant lesions EYES: conjunctiva are pink and non-injected, sclera anicteric OROPHARYNX: MMM, no exudates, no oropharyngeal erythema or ulceration NECK: supple, no JVD LYMPH:  no palpable lymphadenopathy in the cervical, axillary or inguinal regions LUNGS: clear to auscultation b/l with normal respiratory effort HEART: regular rate & rhythm ABDOMEN:  normoactive bowel sounds , non tender, not distended. No palpable hepatosplenomegaly.  Extremity: no pedal edema PSYCH: alert & oriented x 3 with fluent speech NEURO: no focal motor/sensory deficits  LABORATORY DATA:  I have reviewed the data as listed  . CBC Latest Ref Rng & Units 06/18/2020 05/07/2020 05/07/2020  WBC 4.0 - 10.5 K/uL 14.3(H) 7.6 -  Hemoglobin 13.0 - 17.0 g/dL 6.6(LL) 10.1(L) -  Hematocrit 39 - 52 % 19.7(L) 26.7(L) 30.9(L)  Platelets 150 - 400 K/uL 328 236 -    . CMP Latest Ref Rng & Units 04/17/2020 04/10/2020 04/09/2020  Glucose 70 - 99 mg/dL 324(H) 167(H) 149(H)  BUN 8 - 23 mg/dL $Remove'15 17 17   'kWWaaPq$ Creatinine 0.61 - 1.24 mg/dL 0.52(L) 0.48(L) 0.44(L)  Sodium 135 - 145 mmol/L 135 138 139  Potassium 3.5 - 5.1 mmol/L 4.1 3.9 4.4  Chloride 98 - 111 mmol/L 102 105 107  CO2 22 - 32 mmol/L $RemoveB'24 26 24  'uhCoHMZZ$ Calcium 8.9 - 10.3 mg/dL 8.1(L) 8.2(L) 8.2(L)  Total Protein 6.5 - 8.1 g/dL 6.1(L) 5.7(L) 5.6(L)  Total Bilirubin 0.3 - 1.2 mg/dL 1.4(H) 0.8 0.8  Alkaline Phos 38 - 126 U/L 58 50 47  AST 15 - 41 U/L 21 32 34  ALT 0 - 44 U/L 40 52(H) 43   09/28/2016 Flow Cytometry:   RADIOGRAPHIC STUDIES: I have personally reviewed the radiological images as listed and agreed with the findings in the report. No results found.  ASSESSMENT & PLAN:   79 yo with   1) T cell large granular leukemia s/p transfusion dependent anemia - previous blood transfusion dependent and was last on MTX $Remo'30mg'vmYib$  po weekly a few months ago 2) IgG Lambda MGUS 3) recent COVID-19 infection PLAN: -Discussed pt labwork today, 06/18/20; WBC are increasing, Hgb is critically low, PLT is nml, Immature Retic Fract is WNL. -Advised pt to restart 10 mg Methotrexate on 06/24/20 if nothing prohibitive discovered on Colonoscopy/Endoscopy. -Advised pt that if he is experiencing an Immune Colitis it may be flaring due to holding Methotrexate.  -Discussed CDC guidelines regarding the COVID19 booster. Recommend pt receive after Colonoscopy/Endoscopy.  -Recommend pt f/u for Colonoscopy & Endoscopy with Dr. Benson Norway as scheduled.  -Continue 2 mg Folic acid and a B-complex daily. -Will give pt 2 units of PRBC tomorrow. -Will see back in 11 days with labs and 2 units of PRBC   FOLLOW UP: 2 units of PRBC tomorrow RTC with Dr Irene Limbo with labs and appointment for 2 units of  PRBCs on 11/23   The total time spent in the appt was 30 minutes and more than 50% was on counseling and direct patient cares.  All of the patient's questions were answered with apparent satisfaction. The patient knows to call the clinic with any problems, questions or  concerns.   Sullivan Lone MD [place] [facility]IVMS [facility] Hematology/Oncology Physician [facility]  (Office):       [phone] (Work cell):  [phone] (Fax):           [phone]  06/18/2020 1:34 PM  I, Yevette Edwards, am acting as a scribe for Dr. Sullivan Lone.   .I have reviewed the above documentation for accuracy and completeness, and I agree with the above. Brunetta Genera MD

## 2020-06-19 ENCOUNTER — Inpatient Hospital Stay: Payer: Medicare HMO

## 2020-06-19 DIAGNOSIS — D649 Anemia, unspecified: Secondary | ICD-10-CM

## 2020-06-19 DIAGNOSIS — C91Z0 Other lymphoid leukemia not having achieved remission: Secondary | ICD-10-CM

## 2020-06-19 MED ORDER — METHYLPREDNISOLONE SODIUM SUCC 40 MG IJ SOLR
40.0000 mg | Freq: Once | INTRAMUSCULAR | Status: AC
  Administered 2020-06-19: 40 mg via INTRAVENOUS

## 2020-06-19 MED ORDER — METHYLPREDNISOLONE SODIUM SUCC 40 MG IJ SOLR
INTRAMUSCULAR | Status: AC
  Filled 2020-06-19: qty 1

## 2020-06-19 MED ORDER — ACETAMINOPHEN 325 MG PO TABS
ORAL_TABLET | ORAL | Status: AC
  Filled 2020-06-19: qty 2

## 2020-06-19 MED ORDER — ACETAMINOPHEN 325 MG PO TABS
650.0000 mg | ORAL_TABLET | Freq: Once | ORAL | Status: AC
  Administered 2020-06-19: 650 mg via ORAL

## 2020-06-19 MED ORDER — SODIUM CHLORIDE 0.9% IV SOLUTION
250.0000 mL | Freq: Once | INTRAVENOUS | Status: AC
  Administered 2020-06-19: 250 mL via INTRAVENOUS
  Filled 2020-06-19: qty 250

## 2020-06-19 NOTE — Progress Notes (Signed)
At 1150, blood transfusion #2 was alarming occluded. IV was unable to flush saline, despite attempting to reposition. No discomfort per patient, no redness or swelling. IV was removed and another started in other arm and transfusion resumed at 1158 (still during the first 15 minute part of the blood).

## 2020-06-20 LAB — TYPE AND SCREEN
ABO/RH(D): O POS
Antibody Screen: NEGATIVE
Unit division: 0
Unit division: 0

## 2020-06-20 LAB — BPAM RBC
Blood Product Expiration Date: 202112112359
Blood Product Expiration Date: 202112112359
ISSUE DATE / TIME: 202111130904
ISSUE DATE / TIME: 202111130904
Unit Type and Rh: 5100
Unit Type and Rh: 5100

## 2020-06-22 DIAGNOSIS — R1084 Generalized abdominal pain: Secondary | ICD-10-CM | POA: Diagnosis not present

## 2020-06-22 DIAGNOSIS — R110 Nausea: Secondary | ICD-10-CM | POA: Diagnosis not present

## 2020-06-22 DIAGNOSIS — K52831 Collagenous colitis: Secondary | ICD-10-CM | POA: Diagnosis not present

## 2020-06-22 DIAGNOSIS — K529 Noninfective gastroenteritis and colitis, unspecified: Secondary | ICD-10-CM | POA: Diagnosis not present

## 2020-06-22 DIAGNOSIS — R197 Diarrhea, unspecified: Secondary | ICD-10-CM | POA: Diagnosis not present

## 2020-06-24 ENCOUNTER — Telehealth: Payer: Self-pay | Admitting: Hematology

## 2020-06-24 NOTE — Telephone Encounter (Signed)
Scheduled per los, patient has been called and notified of upcoming appointments. 

## 2020-06-29 ENCOUNTER — Inpatient Hospital Stay: Payer: Medicare HMO

## 2020-06-29 ENCOUNTER — Other Ambulatory Visit: Payer: Self-pay

## 2020-06-29 ENCOUNTER — Inpatient Hospital Stay (HOSPITAL_BASED_OUTPATIENT_CLINIC_OR_DEPARTMENT_OTHER): Payer: Medicare HMO | Admitting: Hematology

## 2020-06-29 ENCOUNTER — Other Ambulatory Visit: Payer: Self-pay | Admitting: *Deleted

## 2020-06-29 DIAGNOSIS — D649 Anemia, unspecified: Secondary | ICD-10-CM | POA: Diagnosis not present

## 2020-06-29 DIAGNOSIS — C91Z0 Other lymphoid leukemia not having achieved remission: Secondary | ICD-10-CM

## 2020-06-29 LAB — CBC WITH DIFFERENTIAL/PLATELET
Abs Immature Granulocytes: 0.07 10*3/uL (ref 0.00–0.07)
Basophils Absolute: 0 10*3/uL (ref 0.0–0.1)
Basophils Relative: 0 %
Eosinophils Absolute: 0.1 10*3/uL (ref 0.0–0.5)
Eosinophils Relative: 1 %
HCT: 23.1 % — ABNORMAL LOW (ref 39.0–52.0)
Hemoglobin: 7.8 g/dL — ABNORMAL LOW (ref 13.0–17.0)
Immature Granulocytes: 1 %
Lymphocytes Relative: 42 %
Lymphs Abs: 3.9 10*3/uL (ref 0.7–4.0)
MCH: 34.7 pg — ABNORMAL HIGH (ref 26.0–34.0)
MCHC: 33.8 g/dL (ref 30.0–36.0)
MCV: 102.7 fL — ABNORMAL HIGH (ref 80.0–100.0)
Monocytes Absolute: 1.2 10*3/uL — ABNORMAL HIGH (ref 0.1–1.0)
Monocytes Relative: 12 %
Neutro Abs: 4.2 10*3/uL (ref 1.7–7.7)
Neutrophils Relative %: 44 %
Platelets: 284 10*3/uL (ref 150–400)
RBC: 2.25 MIL/uL — ABNORMAL LOW (ref 4.22–5.81)
RDW: 19.9 % — ABNORMAL HIGH (ref 11.5–15.5)
WBC: 9.4 10*3/uL (ref 4.0–10.5)
nRBC: 0 % (ref 0.0–0.2)

## 2020-06-29 LAB — PREPARE RBC (CROSSMATCH)

## 2020-06-29 LAB — RETICULOCYTES
Immature Retic Fract: 2 % — ABNORMAL LOW (ref 2.3–15.9)
RBC.: 2.28 MIL/uL — ABNORMAL LOW (ref 4.22–5.81)
Retic Count, Absolute: 10.5 10*3/uL — ABNORMAL LOW (ref 19.0–186.0)
Retic Ct Pct: 0.5 % (ref 0.4–3.1)

## 2020-06-29 LAB — SAMPLE TO BLOOD BANK

## 2020-06-29 MED ORDER — METHYLPREDNISOLONE SODIUM SUCC 40 MG IJ SOLR
40.0000 mg | Freq: Once | INTRAMUSCULAR | Status: AC
  Administered 2020-06-29: 40 mg via INTRAVENOUS

## 2020-06-29 MED ORDER — METHYLPREDNISOLONE SODIUM SUCC 40 MG IJ SOLR
INTRAMUSCULAR | Status: AC
  Filled 2020-06-29: qty 1

## 2020-06-29 MED ORDER — ACETAMINOPHEN 325 MG PO TABS
ORAL_TABLET | ORAL | Status: AC
  Filled 2020-06-29: qty 2

## 2020-06-29 MED ORDER — SODIUM CHLORIDE 0.9% IV SOLUTION
250.0000 mL | Freq: Once | INTRAVENOUS | Status: AC
  Administered 2020-06-29: 250 mL via INTRAVENOUS
  Filled 2020-06-29: qty 250

## 2020-06-29 MED ORDER — ACETAMINOPHEN 325 MG PO TABS
650.0000 mg | ORAL_TABLET | Freq: Once | ORAL | Status: AC
  Administered 2020-06-29: 650 mg via ORAL

## 2020-06-29 NOTE — Progress Notes (Signed)
HEMATOLOGY/ONCOLOGY CLINIC NOTE  Date of Service: 06/29/2020  Patient Care Team: [facility] as PCP - General (Rheumatology) Collier Bullock, MD as Referring Physician (Hematology and Oncology) [name], DO as Referring Physician (Family Medicine)  CHIEF COMPLAINTS/PURPOSE OF CONSULTATION:  Large Granular Lymphocytic Leukemia  Hem/Onc History  1)- chronic leukocytosis (neutrophilia and absolute lymphocytosis), peripheral blood lymphocytes about 5000, about 1/4 of cells have LGL morphology 2)- macrocytic anemia at least since 09/08/2013 (normal CBC on 11/28/2012), MCV now as high as 115, low retics, marrow M:E ratio 15:1 in 05/2016, erythroid hypoplasia 3)- MGUS serum IgG lambda 0.4 g/dL in 07/2014, follow annually 4)- 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 weekly -03/29/2017 increase dose 25 mg po qweek -07/19/2017 increase mtx 30 mg po qweek, stable dose since then   HISTORY OF PRESENTING ILLNESS:  Tommy Peterson is a wonderful 79 y.o. male who has been referred to [place] by Dr [name] for evaluation and management of Large Granular Lymphocytic Leukemia. The pt reports that he is doing well overall.  Pt is here as he is moving his care because his previous Oncologist, Dr. [name], has changed practices. The pt reports that he needed 2 pints of blood every 2 weeks for a year beginning in 2014. Pt has never needed to any erythropoeitin injections. His Hgb has been holding  higher lately, around 10-11. It has been about 7 months since pt has needed his last blood transfusion. Pt has been taking 30 mg of Methotrexate once per week and denies any issues with that dosage. He has continued taking 1 mg Folic Acid once per day. He is also taking a daily multivitamin.   Pt was previously walking a lot but had to slow down due to fatigue. Pt will need a surgery to repair his left inguinal hernia.   In 2017 pt had a C. Diff infection. Pt had recurrent UTI infections in 2019. His last hospitalization for infections was in January 2020. There were no kidney stones or other reasons for repeat infections. Pt denies any pneumonia infections. Pt has had Diabetes for 40+ years and it is currently well controlled. Pt was never given a direct reason for chronic diarrhea but was placed on Budesonide which stopped his symptoms. Pt is currently taking 3 mg twice per week. He notes that he has been experiencing some urinary frequency, but does feel like he is completely emptying his bladder when he uses the restroom. Pt has seen a Urologist who gave him some medication that caused him to urinate up to 30 times per day. He has tried Flomax previously, and still has some at home, but notes that it does not help much. Pt is following with a Dermatologist , Dr. Rhona Raider, for his skin Squamous Cell Carcinoma. He is currently using Efudex to treat. He was having some night sweats about a year ago, but has had none recently.   Most recent lab results (08/28/2019) of CBC w/diff and CMP is as follows: all values are WNL except for Hgb at  11.8, HCT at 35.1, MCV at 122, MCH at 41.1, RDW at 2.87, RDW at 16.0, Abs Immature Granulocyte at 0.11K, Immature Granulocyte Rel at 1.2, Mono Abs at 1.0K, Creatinine at 0.5, Glucose at 199.  On review of systems, pt reports constipation, fatigue and denies diarrhea, unexpected weight loss, fevers, chills, night sweats, abdominal pain, leg swelling and any other  symptoms.   On PMHx the pt reports BPH, Chronic Diarrhea, Chronic Large Granular Lymphocytic Leukemia, Collagenous Collitis, Clostridium defficile colitis, Chronic UTIs, Transfusion-dependant anemia, Type 2 diabetes mellitus.  INTERVAL HISTORY:  Tommy Peterson is a wonderful 79 y.o. male who is here for evaluation and management of Large Granular Lymphocytic Leukemia. The patient's last visit with [place] was on 06/18/2020. The pt reports that he is doing well overall.  The pt reports that he has more energy after his recent blood transfusion. Pt has continued to hold Methotrexate. He had a Colonoscopy and Endoscopy with Dr. Benson Norway. The pt notes that Dr. Benson Norway did not find the source of the his diarrhea.   The pt continues experiencing bowel incontinence and is having 3-4 loose, watery stools per day. He notes that a few hours after a meal he begins experiencing upper abdominal pain, followed by uncontrollable diarrhea. Pt is waiting for his stools to become more liquid to send a repeat stool sample. He continues taking 9 mg Entocort.   Pt has a follow up with Dr. Benson Norway on 12/15. He is scheduled to see Dr. Redmond Baseman for repeated sore throats.  Lab results today (06/29/20) of CBC w/diff and CMP is as follows: all values are WNL except for RBC at 2.25, Hgb at 7.8, HCT at 23.1, MCV at 102.7, MCH at 34.7, RDW at 19.9, Mono Abs at 1.2K. 06/29/2020 Retic Ct Pct at 0.5, Retic Ct Abs at 10.5, Immature Retic Fract at 2.0  On review of systems, pt reports improving fatigue, occasional SOB, upper abdominal pain, abdominal cramping, diarrhea, incontinence, ankle swelling, sore throat and denies cough and any other symptoms.    SURGICAL HISTORY: Past Surgical History:  Procedure Laterality Date  . BONE MARROW BIOPSY Right 05-24-2016;  08-20-2014  . CARDIOVASCULAR STRESS TEST  11-26-2012   [name]  @ [place] ([place]   normal nuclear study w/ no ischemia/  normal LV function and wall motion , ef 60%  (find in care everywhere,epic)  . CATARACT EXTRACTION W/ INTRAOCULAR LENS  IMPLANT, BILATERAL  2015  . CYSTOSCOPY WITH LITHOLAPAXY N/A 12/25/2017   Procedure: CYSTOSCOPY WITH LITHOLAPAXY AND FULGERATION;  Surgeon: [name], MD;  Location: [facility];  Service: Urology;  Laterality: N/A;  . INGUINAL HERNIA REPAIR Right 1980s  . LOOP RECORDER INSERTION N/A 04/10/2018   Procedure: LOOP RECORDER INSERTION;  Surgeon: [name], MD;  Location: [facility];  Service: Cardiovascular;  Laterality: N/A;  . PENILE PROSTHESIS IMPLANT  12-02-2015   dr [name] @ [facility] in [place]  . SHOULDER SURGERY Left 08/2016  . TONSILLECTOMY  child  . TRANSURETHRAL RESECTION OF PROSTATE  2009   AND REPAIR RECURRENT RIGHT INGUINAL HERNIA    SOCIAL HISTORY: Social History   Socioeconomic History  . Marital status: Widowed    Spouse name: engaged  . Number of children: 8  . Years of education: college-2  . Highest education level: Not on file  Occupational History  . Occupation: Press photographer  . 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 Smoking status: Former Smoker  Years: 20.00    Types: Cigarettes    Quit date: 05/04/1996    Years since quitting: 24.1  . Smokeless tobacco: Never Used  Vaping Use  . Vaping Use: Never used  Substance and Sexual Activity  . Alcohol use: Yes    Comment: 1-2 glasses of wine every other day  . Drug use: No  . Sexual activity: Never  Other Topics Concern  . Not on file  Social History Narrative  . Not on file   Social Determinants of Health   Financial Resource Strain:   . Difficulty of Paying Living Expenses: Not on file  Food Insecurity:   . Worried About Charity fundraiser in the Last Year: Not on file  . Ran Out of Food in the Last Year: Not on file  Transportation Needs:   . Lack of Transportation (Medical): Not on file  . Lack of Transportation (Non-Medical): Not on file  Physical Activity:   . Days of  Exercise per Week: Not on file  . Minutes of Exercise per Session: Not on file  Stress:   . Feeling of Stress : Not on file  Social Connections:   . Frequency of Communication with Friends and Family: Not on file  . Frequency of Social Gatherings with Friends and Family: Not on file  . Attends Religious Services: Not on file  . Active Member of Clubs or Organizations: Not on file  . Attends Archivist Meetings: Not on file  . Marital Status: Not on file  Intimate Partner Violence:   . Fear of Current or Ex-Partner: Not on file  . Emotionally Abused: Not on file  . Physically Abused: Not on file  . Sexually Abused: Not on file    FAMILY HISTORY: Family History  Problem Relation Age of Onset  . Diabetes Mother   . Heart failure Mother     ALLERGIES:  is allergic to bee venom, metformin and related, and sulfamethoxazole-trimethoprim.  MEDICATIONS:  Current Outpatient Medications  Medication Sig Dispense Refill  . acetaminophen (TYLENOL) 325 MG tablet Take 2 tablets (650 mg total) by mouth every 6 (six) hours as needed for mild pain (or Fever >/= 101).    Marland Kitchen albuterol (VENTOLIN HFA) 108 (90 Base) MCG/ACT inhaler Inhale 2 puffs into the lungs every 6 (six) hours. 8 g 1  . b complex vitamins capsule Take 1 capsule by mouth daily.    . budesonide (ENTOCORT EC) 3 MG 24 hr capsule Take 6 mg by mouth daily.    . Cranberry 400 MG TABS Take 1 tablet by mouth daily.     . D-Mannose 500 MG CAPS Take 500 mg by mouth daily.    . folic acid (FOLVITE) 1 MG tablet Take 1 mg by mouth daily.    Marland Kitchen guaiFENesin-dextromethorphan (ROBITUSSIN DM) 100-10 MG/5ML syrup Take 10 mLs by mouth every 4 (four) hours as needed for cough. 118 mL 0  . Insulin Glargine-Lixisenatide (SOLIQUA) 100-33 UNT-MCG/ML SOPN Inject 28 Units into the skin at bedtime.     Marland Kitchen NOVOLOG FLEXPEN 100 UNIT/ML FlexPen Inject 4 Units into the skin in the morning and at bedtime.     . pioglitazone (ACTOS) 15 MG tablet Take 15 mg  by mouth every morning.     . polyethylene glycol (MIRALAX / GLYCOLAX) 17 g packet Take 17 g by mouth 2 (two) times daily. 14 each 0  . vitamin B-12 (CYANOCOBALAMIN) 100 MCG tablet Take 100 mcg by mouth daily.  No current facility-administered medications for this visit.    REVIEW OF SYSTEMS:   A 10+ POINT REVIEW OF SYSTEMS WAS OBTAINED including neurology, dermatology, psychiatry, cardiac, respiratory, lymph, extremities, GI, GU, Musculoskeletal, constitutional, breasts, reproductive, HEENT.  All pertinent positives are noted in the HPI.  All others are negative.   PHYSICAL EXAMINATION: ECOG PERFORMANCE STATUS: 1 - Symptomatic but completely ambulatory  .Exam was given in a chair   GENERAL:alert, in no acute distress and comfortable SKIN: no acute rashes, no significant lesions EYES: conjunctiva are pink and non-injected, sclera anicteric OROPHARYNX: MMM, no exudates, no oropharyngeal erythema or ulceration NECK: supple, no JVD LYMPH:  no palpable lymphadenopathy in the cervical, axillary or inguinal regions LUNGS: clear to auscultation b/l with normal respiratory effort HEART: regular rate & rhythm ABDOMEN:  normoactive bowel sounds , non tender, not distended. No palpable hepatosplenomegaly.  Extremity: no pedal edema PSYCH: alert & oriented x 3 with fluent speech NEURO: no focal motor/sensory deficits  LABORATORY DATA:  I have reviewed the data as listed  . CBC Latest Ref Rng & Units 06/29/2020 06/18/2020 05/07/2020  WBC 4.0 - 10.5 K/uL 9.4 14.3(H) 7.6  Hemoglobin 13.0 - 17.0 g/dL 7.8(L) 6.6(LL) 10.1(L)  Hematocrit 39 - 52 % 23.1(L) 19.7(L) 26.7(L)  Platelets 150 - 400 K/uL 284 328 236    . CMP Latest Ref Rng & Units 04/17/2020 04/10/2020 04/09/2020  Glucose 70 - 99 mg/dL 324(H) 167(H) 149(H)  BUN 8 - 23 mg/dL $Remove'15 17 17  'oBYgmrv$ Creatinine 0.61 - 1.24 mg/dL 0.52(L) 0.48(L) 0.44(L)  Sodium 135 - 145 mmol/L 135 138 139  Potassium 3.5 - 5.1 mmol/L 4.1 3.9 4.4  Chloride 98 - 111  mmol/L 102 105 107  CO2 22 - 32 mmol/L $RemoveB'24 26 24  'KSWeNjIF$ Calcium 8.9 - 10.3 mg/dL 8.1(L) 8.2(L) 8.2(L)  Total Protein 6.5 - 8.1 g/dL 6.1(L) 5.7(L) 5.6(L)  Total Bilirubin 0.3 - 1.2 mg/dL 1.4(H) 0.8 0.8  Alkaline Phos 38 - 126 U/L 58 50 47  AST 15 - 41 U/L 21 32 34  ALT 0 - 44 U/L 40 52(H) 43   09/28/2016 Flow Cytometry:   RADIOGRAPHIC STUDIES: I have personally reviewed the radiological images as listed and agreed with the findings in the report. No results found.  ASSESSMENT & PLAN:   79 yo with   1) T cell large granular leukemia s/p transfusion dependent anemia - previous blood transfusion dependent and was last on MTX $Remo'30mg'XaTbA$  po weekly a few months ago 2) IgG Lambda MGUS 3) recent COVID-19 infection PLAN: -Discussed pt labwork today, 06/29/20; all values are WNL except for RBC at 2.25, Hgb at 7.8, HCT at 23.1, MCV at 102.7, MCH at 34.7, RDW at 19.9, Mono Abs at 1.2K. -Discussed 06/29/2020 Retic Ct Pct at 0.5, Retic Ct Abs at 10.5, Immature Retic Fract at 2.0.  -No lab or clinical evidence of Large Granular Lymphocytic Leukemia progression at this time. Will continue watchful observation.  -Plan to restart pt on 10 mg Methotrexate if nothing prohibitive discovered on Colonoscopy/Endoscopy. -Will contact Dr. Ulyses Amor office to get access to recent Colonoscopy + Endoscopy. -Will get labs and give 2 units of PRBC as needed every 2 weeks. -Continue 2 mg Folic acid and a B-complex daily. -Will see back in 4 weeks    FOLLOW UP: PLz schedule for labs and 2 units of PRBC every 2 weeks x 3 MD visit in 4 weeks   The total time spent in the appt was 20 minutes and more than  50% was on counseling and direct patient cares.  All of the patient's questions were answered with apparent satisfaction. The patient knows to call the clinic with any problems, questions or concerns.   Sullivan Lone MD [place] AAHIVMS [facility] Hematology/Oncology Physician [facility]  (Office):        [phone] (Work cell):  [phone] (Fax):           [phone]  06/29/2020 6:46 PM  I, Yevette Edwards, am acting as a scribe for Dr. Sullivan Lone.   .I have reviewed the above documentation for accuracy and completeness, and I agree with the above. Brunetta Genera MD

## 2020-06-29 NOTE — Patient Instructions (Addendum)
Blood Transfusion, Adult, Care After This sheet gives you information about how to care for yourself after your procedure. Your doctor may also give you more specific instructions. If you have problems or questions, contact your doctor. What can I expect after the procedure? After the procedure, it is common to have:  Bruising and soreness at the IV site.  A fever or chills on the day of the procedure. This may be your body's response to the new blood cells received.  A headache. Follow these instructions at home: Insertion site care      Follow instructions from your doctor about how to take care of your insertion site. This is where an IV tube was put into your vein. Make sure you: ? Wash your hands with soap and water before and after you change your bandage (dressing). If you cannot use soap and water, use hand sanitizer. ? Change your bandage as told by your doctor.  Check your insertion site every day for signs of infection. Check for: ? Redness, swelling, or pain. ? Bleeding from the site. ? Warmth. ? Pus or a bad smell. General instructions  Take over-the-counter and prescription medicines only as told by your doctor.  Rest as told by your doctor.  Go back to your normal activities as told by your doctor.  Keep all follow-up visits as told by your doctor. This is important. Contact a doctor if:  You have itching or red, swollen areas of skin (hives).  You feel worried or nervous (anxious).  You feel weak after doing your normal activities.  You have redness, swelling, warmth, or pain around the insertion site.  You have blood coming from the insertion site, and the blood does not stop with pressure.  You have pus or a bad smell coming from the insertion site. Get help right away if:  You have signs of a serious reaction. This may be coming from an allergy or the body's defense system (immune system). Signs include: ? Trouble breathing or shortness of  breath. ? Swelling of the face or feeling warm (flushed). ? Fever or chills. ? Head, chest, or back pain. ? Dark pee (urine) or blood in the pee. ? Widespread rash. ? Fast heartbeat. ? Feeling dizzy or light-headed. You may receive your blood transfusion in an outpatient setting. If so, you will be told whom to contact to report any reactions. These symptoms may be an emergency. Do not wait to see if the symptoms will go away. Get medical help right away. Call your local emergency services (911 in the [place] Do not drive yourself to the hospital. Summary  Bruising and soreness at the IV site are common.  Check your insertion site every day for signs of infection.  Rest as told by your doctor. Go back to your normal activities as told by your doctor.  Get help right away if you have signs of a serious reaction. This information is not intended to replace advice given to you by your health care provider. Make sure you discuss any questions you have with your health care provider. Document Revised: 01/16/2019 Document Reviewed: 01/16/2019 Elsevier Patient Education  [place].    Diarrhea, Adult Diarrhea is when you pass loose and watery poop (stool) often. Diarrhea can make you feel weak and cause you to lose water in your body (get dehydrated). Losing water in your body can cause you to:  Feel tired and thirsty.  Have a dry mouth.  Go pee (urinate)  less often. Diarrhea often lasts 2-3 days. However, it can last longer if it is a sign of something more serious. It is important to treat your diarrhea as told by your doctor. Follow these instructions at home: Eating and drinking     Follow these instructions as told by your doctor:  Take an ORS (oral rehydration solution). This is a drink that helps you replace fluids and minerals your body lost. It is sold at pharmacies and stores.  Drink plenty of fluids, such as: ? Water. ? Ice chips. ? Diluted fruit  juice. ? Low-calorie sports drinks. ? Milk, if you want.  Avoid drinking fluids that have a lot of sugar or caffeine in them.  Eat bland, easy-to-digest foods in small amounts as you are able. These foods include: ? Bananas. ? Applesauce. ? Rice. ? Low-fat (lean) meats. ? Toast. ? Crackers.  Avoid alcohol.  Avoid spicy or fatty foods.  Medicines  Take over-the-counter and prescription medicines only as told by your doctor.  If you were prescribed an antibiotic medicine, take it as told by your doctor. Do not stop using the antibiotic even if you start to feel better. General instructions   Wash your hands often using soap and water. If soap and water are not available, use a hand sanitizer. Others in your home should wash their hands as well. Hands should be washed: ? After using the toilet or changing a diaper. ? Before preparing, cooking, or serving food. ? While caring for a sick person. ? While visiting someone in a hospital.  Drink enough fluid to keep your pee (urine) pale yellow.  Rest at home while you get better.  Watch your condition for any changes.  Take a warm bath to help with any burning or pain from having diarrhea.  Keep all follow-up visits as told by your doctor. This is important. Contact a doctor if:  You have a fever.  Your diarrhea gets worse.  You have new symptoms.  You cannot keep fluids down.  You feel light-headed or dizzy.  You have a headache.  You have muscle cramps. Get help right away if:  You have chest pain.  You feel very weak or you pass out (faint).  You have bloody or black poop or poop that looks like tar.  You have very bad pain, cramping, or bloating in your belly (abdomen).  You have trouble breathing or you are breathing very quickly.  Your heart is beating very quickly.  Your skin feels cold and clammy.  You feel confused.  You have signs of losing too much water in your body, such as: ? Dark pee,  very little pee, or no pee. ? Cracked lips. ? Dry mouth. ? Sunken eyes. ? Sleepiness. ? Weakness. Summary  Diarrhea is when you pass loose and watery poop (stool) often.  Diarrhea can make you feel weak and cause you to lose water in your body (get dehydrated).  Take an ORS (oral rehydration solution). This is a drink that is sold at pharmacies and stores.  Eat bland, easy-to-digest foods in small amounts as you are able.  Contact a doctor if your condition gets worse. Get help right away if you have signs that you have lost too much water in your body. This information is not intended to replace advice given to you by your health care provider. Make sure you discuss any questions you have with your health care provider. Document Revised: 12/28/2017 Document Reviewed: 12/28/2017 Elsevier Patient  Education  2020 Elsevier Inc.  

## 2020-06-29 NOTE — Progress Notes (Signed)
Patient stable upon discharge from the cancer center. Patient presented with no signs of acute distress upon the conclusion of their treatment.

## 2020-06-30 LAB — BPAM RBC
Blood Product Expiration Date: 202112192359
Blood Product Expiration Date: 202112232359
ISSUE DATE / TIME: 202111231324
ISSUE DATE / TIME: 202111231324
Unit Type and Rh: 5100
Unit Type and Rh: 5100

## 2020-06-30 LAB — TYPE AND SCREEN
ABO/RH(D): O POS
Antibody Screen: NEGATIVE
Unit division: 0
Unit division: 0

## 2020-07-05 ENCOUNTER — Other Ambulatory Visit: Payer: Self-pay | Admitting: *Deleted

## 2020-07-05 DIAGNOSIS — C91Z0 Other lymphoid leukemia not having achieved remission: Secondary | ICD-10-CM

## 2020-07-05 DIAGNOSIS — D649 Anemia, unspecified: Secondary | ICD-10-CM

## 2020-07-07 ENCOUNTER — Telehealth: Payer: Self-pay | Admitting: *Deleted

## 2020-07-07 NOTE — Telephone Encounter (Signed)
Contacted patient at Dr.Kale's request. Per Dr.Kale - Resume Methotrexate 10 mg, take once per week. Patient verbalized understanding of instructions.

## 2020-07-09 ENCOUNTER — Telehealth: Payer: Self-pay | Admitting: Hematology

## 2020-07-09 NOTE — Telephone Encounter (Signed)
Scheduled per 11/23 los, patient has been called and notified. 

## 2020-07-13 DIAGNOSIS — J343 Hypertrophy of nasal turbinates: Secondary | ICD-10-CM | POA: Diagnosis not present

## 2020-07-13 DIAGNOSIS — Z974 Presence of external hearing-aid: Secondary | ICD-10-CM | POA: Diagnosis not present

## 2020-07-13 DIAGNOSIS — J029 Acute pharyngitis, unspecified: Secondary | ICD-10-CM | POA: Diagnosis not present

## 2020-07-13 DIAGNOSIS — R197 Diarrhea, unspecified: Secondary | ICD-10-CM | POA: Diagnosis not present

## 2020-07-13 DIAGNOSIS — J384 Edema of larynx: Secondary | ICD-10-CM | POA: Diagnosis not present

## 2020-07-14 ENCOUNTER — Other Ambulatory Visit: Payer: Self-pay | Admitting: Otolaryngology

## 2020-07-14 DIAGNOSIS — R1310 Dysphagia, unspecified: Secondary | ICD-10-CM

## 2020-07-15 ENCOUNTER — Other Ambulatory Visit: Payer: Self-pay | Admitting: *Deleted

## 2020-07-15 ENCOUNTER — Other Ambulatory Visit: Payer: Self-pay | Admitting: Hematology

## 2020-07-15 ENCOUNTER — Other Ambulatory Visit: Payer: Self-pay

## 2020-07-15 ENCOUNTER — Inpatient Hospital Stay: Payer: Medicare HMO | Attending: Hematology

## 2020-07-15 ENCOUNTER — Inpatient Hospital Stay: Payer: Medicare HMO

## 2020-07-15 DIAGNOSIS — Z794 Long term (current) use of insulin: Secondary | ICD-10-CM | POA: Diagnosis not present

## 2020-07-15 DIAGNOSIS — D472 Monoclonal gammopathy: Secondary | ICD-10-CM | POA: Diagnosis not present

## 2020-07-15 DIAGNOSIS — C91Z0 Other lymphoid leukemia not having achieved remission: Secondary | ICD-10-CM

## 2020-07-15 DIAGNOSIS — Z8249 Family history of ischemic heart disease and other diseases of the circulatory system: Secondary | ICD-10-CM | POA: Diagnosis not present

## 2020-07-15 DIAGNOSIS — E119 Type 2 diabetes mellitus without complications: Secondary | ICD-10-CM | POA: Diagnosis not present

## 2020-07-15 DIAGNOSIS — Z87891 Personal history of nicotine dependence: Secondary | ICD-10-CM | POA: Insufficient documentation

## 2020-07-15 DIAGNOSIS — D539 Nutritional anemia, unspecified: Secondary | ICD-10-CM | POA: Insufficient documentation

## 2020-07-15 DIAGNOSIS — D649 Anemia, unspecified: Secondary | ICD-10-CM

## 2020-07-15 DIAGNOSIS — Z79899 Other long term (current) drug therapy: Secondary | ICD-10-CM | POA: Insufficient documentation

## 2020-07-15 LAB — CBC WITH DIFFERENTIAL (CANCER CENTER ONLY)
Abs Immature Granulocytes: 0.12 10*3/uL — ABNORMAL HIGH (ref 0.00–0.07)
Basophils Absolute: 0 10*3/uL (ref 0.0–0.1)
Basophils Relative: 0 %
Eosinophils Absolute: 0.1 10*3/uL (ref 0.0–0.5)
Eosinophils Relative: 0 %
HCT: 24.9 % — ABNORMAL LOW (ref 39.0–52.0)
Hemoglobin: 8.1 g/dL — ABNORMAL LOW (ref 13.0–17.0)
Immature Granulocytes: 1 %
Lymphocytes Relative: 46 %
Lymphs Abs: 5.5 10*3/uL — ABNORMAL HIGH (ref 0.7–4.0)
MCH: 32.9 pg (ref 26.0–34.0)
MCHC: 32.5 g/dL (ref 30.0–36.0)
MCV: 101.2 fL — ABNORMAL HIGH (ref 80.0–100.0)
Monocytes Absolute: 1.1 10*3/uL — ABNORMAL HIGH (ref 0.1–1.0)
Monocytes Relative: 9 %
Neutro Abs: 5.3 10*3/uL (ref 1.7–7.7)
Neutrophils Relative %: 44 %
Platelet Count: 312 10*3/uL (ref 150–400)
RBC: 2.46 MIL/uL — ABNORMAL LOW (ref 4.22–5.81)
RDW: 18.8 % — ABNORMAL HIGH (ref 11.5–15.5)
WBC Count: 12.1 10*3/uL — ABNORMAL HIGH (ref 4.0–10.5)
nRBC: 0 % (ref 0.0–0.2)

## 2020-07-15 LAB — SAMPLE TO BLOOD BANK

## 2020-07-15 LAB — PREPARE RBC (CROSSMATCH)

## 2020-07-15 LAB — TYPE AND SCREEN
ABO/RH(D): O POS
Antibody Screen: NEGATIVE

## 2020-07-15 MED ORDER — SODIUM CHLORIDE 0.9% IV SOLUTION
250.0000 mL | Freq: Once | INTRAVENOUS | Status: AC
  Administered 2020-07-15: 250 mL via INTRAVENOUS
  Filled 2020-07-15: qty 250

## 2020-07-15 MED ORDER — METHYLPREDNISOLONE SODIUM SUCC 40 MG IJ SOLR
40.0000 mg | Freq: Once | INTRAMUSCULAR | Status: AC
  Administered 2020-07-15: 40 mg via INTRAVENOUS

## 2020-07-15 MED ORDER — ACETAMINOPHEN 325 MG PO TABS
ORAL_TABLET | ORAL | Status: AC
  Filled 2020-07-15: qty 2

## 2020-07-15 MED ORDER — ACETAMINOPHEN 325 MG PO TABS
650.0000 mg | ORAL_TABLET | Freq: Once | ORAL | Status: AC
  Administered 2020-07-15: 650 mg via ORAL

## 2020-07-15 MED ORDER — METHYLPREDNISOLONE SODIUM SUCC 40 MG IJ SOLR
INTRAMUSCULAR | Status: AC
  Filled 2020-07-15: qty 1

## 2020-07-15 NOTE — Patient Instructions (Signed)

## 2020-07-15 NOTE — Progress Notes (Signed)
Orders entered for 1 unit PRBCs per Verbal order from Dr. Irene Limbo. Confirmed orders with [place] in blood bank.

## 2020-07-16 LAB — BPAM RBC
Blood Product Expiration Date: 202201082359
ISSUE DATE / TIME: 202112091443
Unit Type and Rh: 5100

## 2020-07-16 LAB — TYPE AND SCREEN
ABO/RH(D): O POS
Antibody Screen: NEGATIVE
Unit division: 0

## 2020-07-19 ENCOUNTER — Ambulatory Visit
Admission: RE | Admit: 2020-07-19 | Discharge: 2020-07-19 | Disposition: A | Discharge status: Home / Self Care | Payer: Medicare HMO | Source: Ambulatory Visit | Attending: Otolaryngology | Admitting: Otolaryngology

## 2020-07-19 DIAGNOSIS — R1310 Dysphagia, unspecified: Secondary | ICD-10-CM

## 2020-07-19 DIAGNOSIS — K2289 Other specified disease of esophagus: Secondary | ICD-10-CM | POA: Diagnosis not present

## 2020-07-19 IMAGING — RF DG ESOPHAGUS
10 series · 14 of 24 positions shown · non-contrast
Comparison: [DATE] CT abdomen/pelvis. [DATE] modified
barium swallow.

CLINICAL DATA: New odynophagia. Chronic globus sensation in the
throat.

EXAM:
ESOPHOGRAM / BARIUM SWALLOW / BARIUM TABLET STUDY
TECHNIQUE: Single contrast examination performed using thin barium liquid. The
patient was observed with fluoroscopy swallowing a 13 mm barium
sulphate tablet.
FLUOROSCOPY TIME:  Fluoroscopy Time:  3 minutes 24 seconds
Radiation Exposure Index (if provided by the fluoroscopic device):
431 mGy
Number of Acquired Spot Images: 5

[Series 1: sequence · 2 of 30 frames shown (1 of 8)]
[frame 5/30]
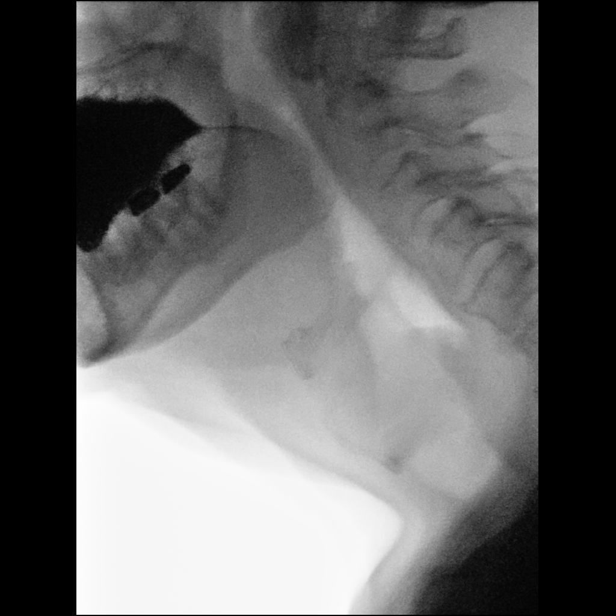
[frame 26/30]
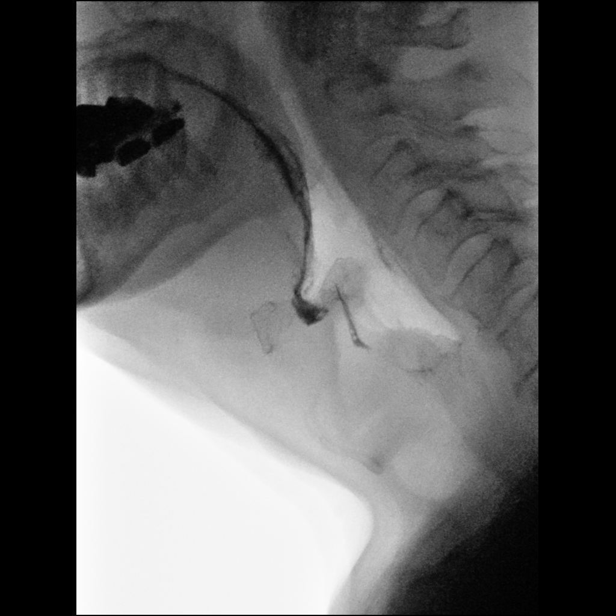

[Series 3: sequence · 1 of 27 frames shown (2 of 8)]
[frame 19/27]
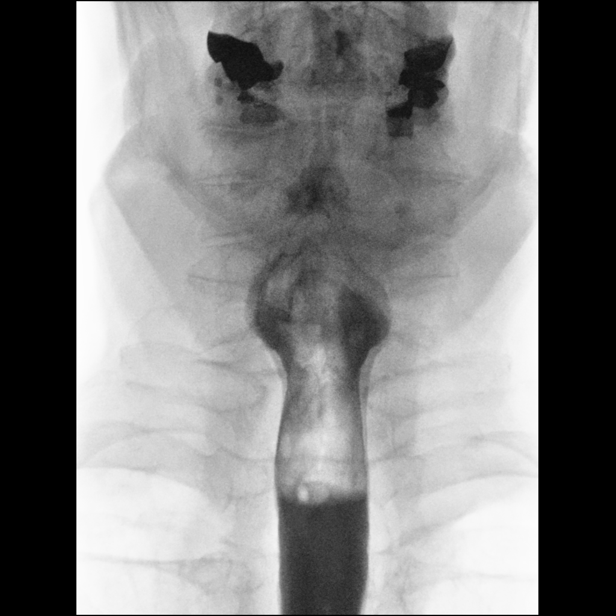

[Series 4: sequence · 2 of 16 frames shown (3 of 8)]
[frame 8/16]
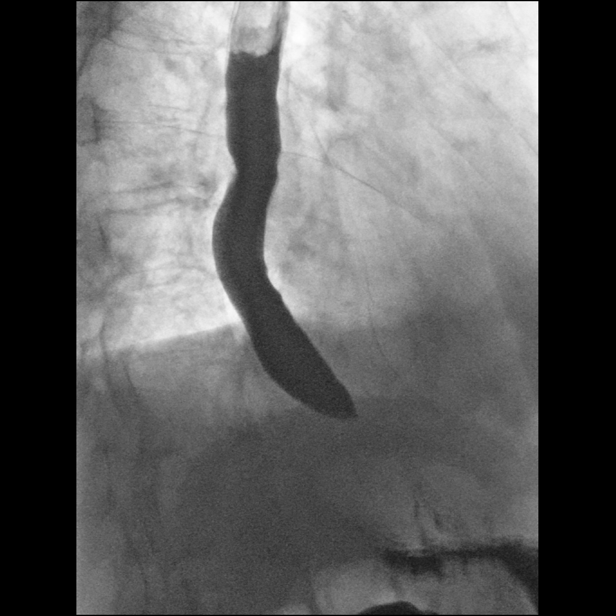
[frame 14/16]
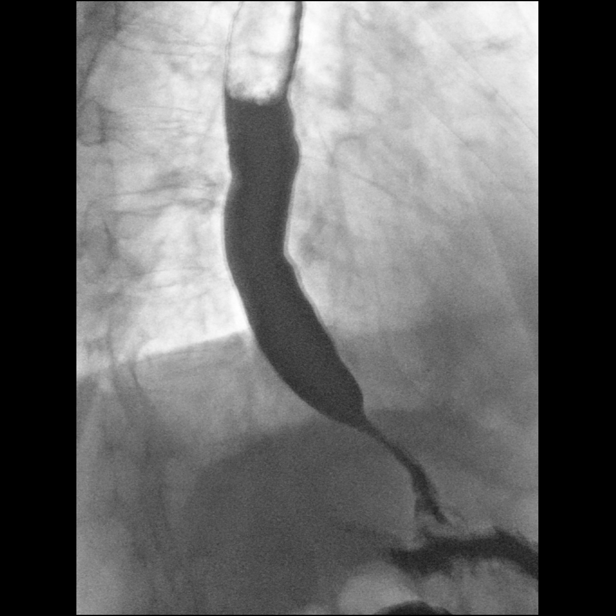

[Series 5: one shot · 0.15mm/px · 1 of 4 slices shown (1 of 2)]
[im 3/4]
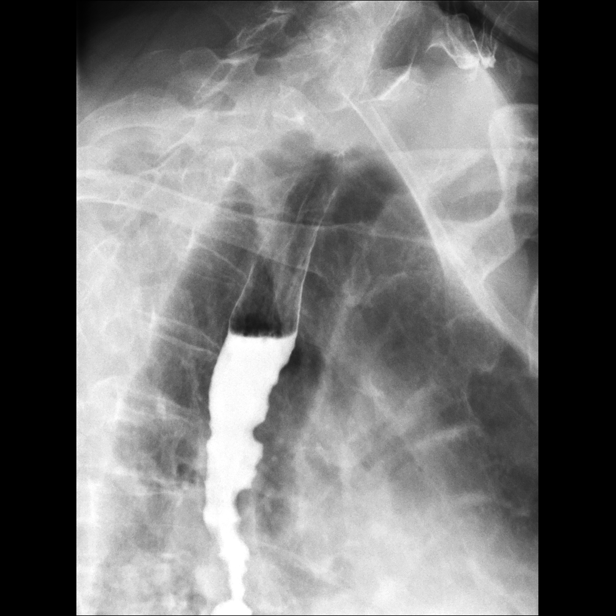

[Series 6: sequence · 2 of 46 frames shown (4 of 8)]
[frame 12/46]
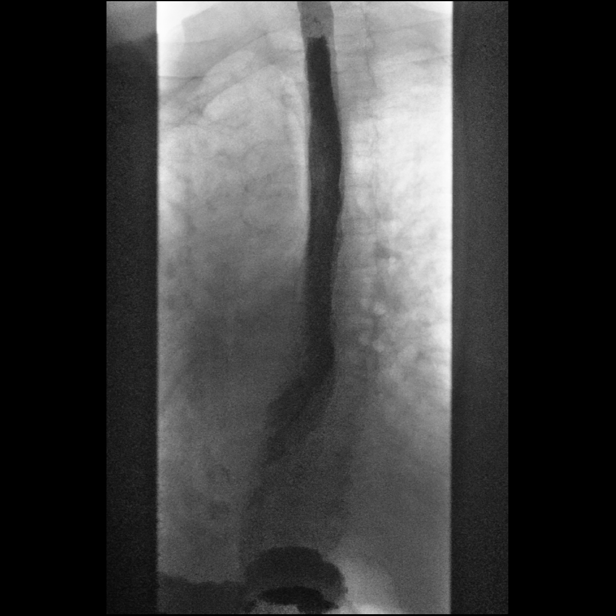
[frame 40/46]
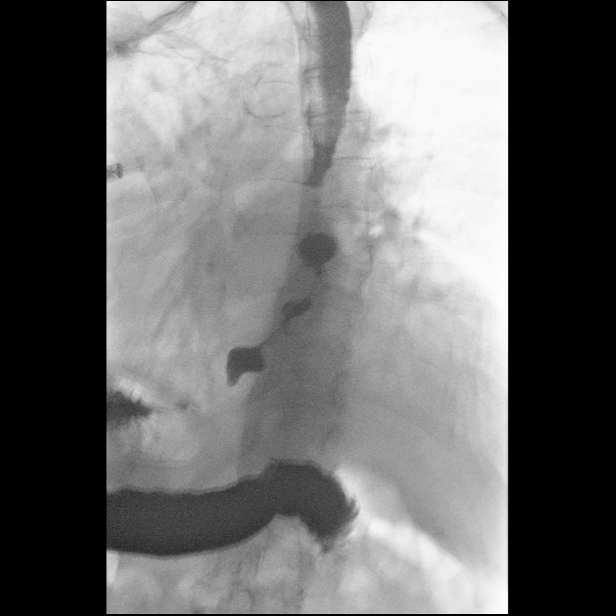

[Series 7: sequence · 1 of 150 frames shown (5 of 8)]
[frame 76/150]
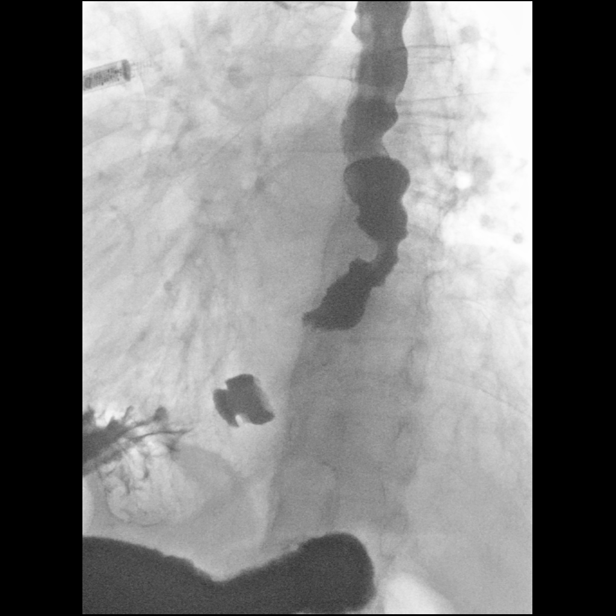

[Series 8: sequence · 1 of 50 frames shown (6 of 8)]
[frame 8/50]
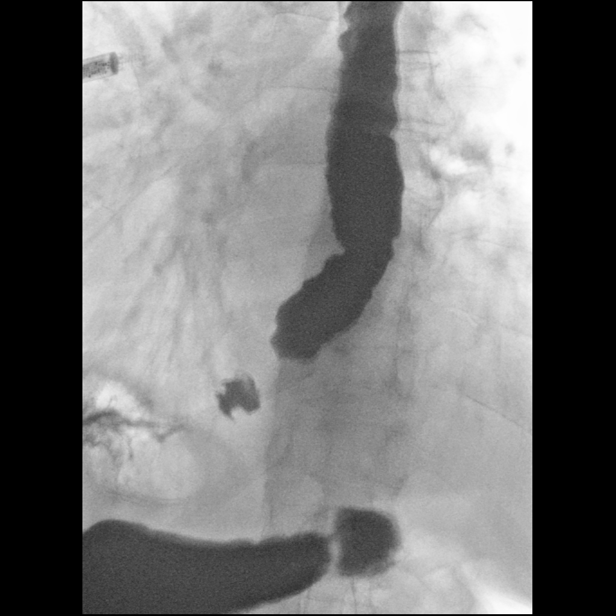

[Series 9: sequence · 2 of 44 frames shown (7 of 8)]
[frame 13/44]
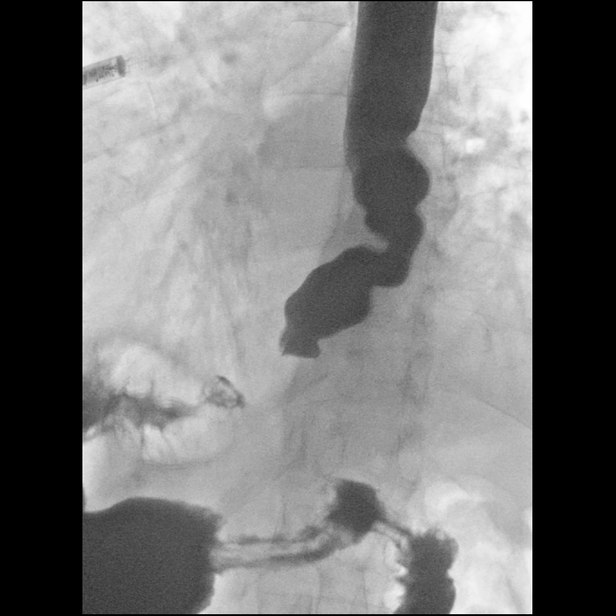
[frame 23/44]
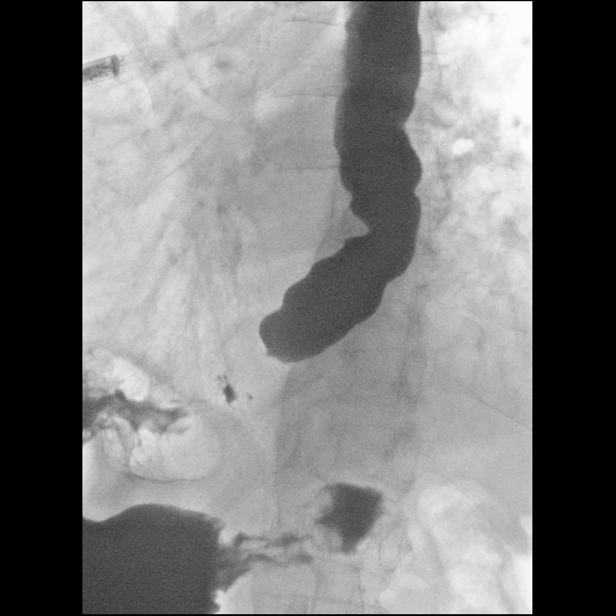

[Series 10: sequence · 1 of 52 frames shown (8 of 8)]
[frame 27/52]
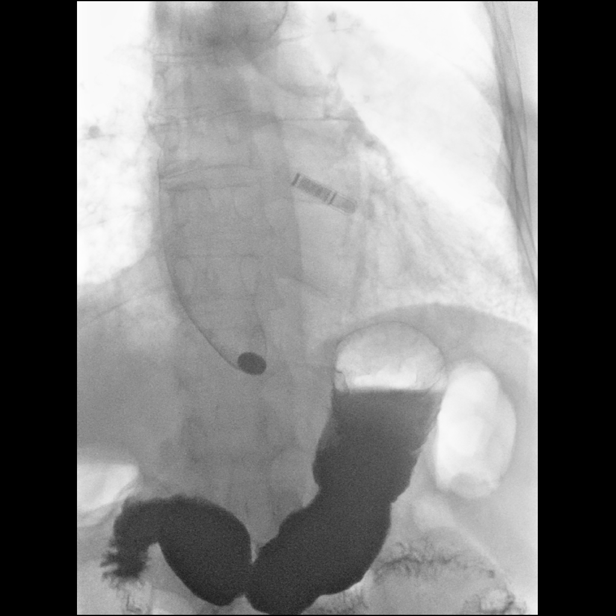

[Series 11: one shot · 1 of 2 slices shown (2 of 2)]
[im 2/2]
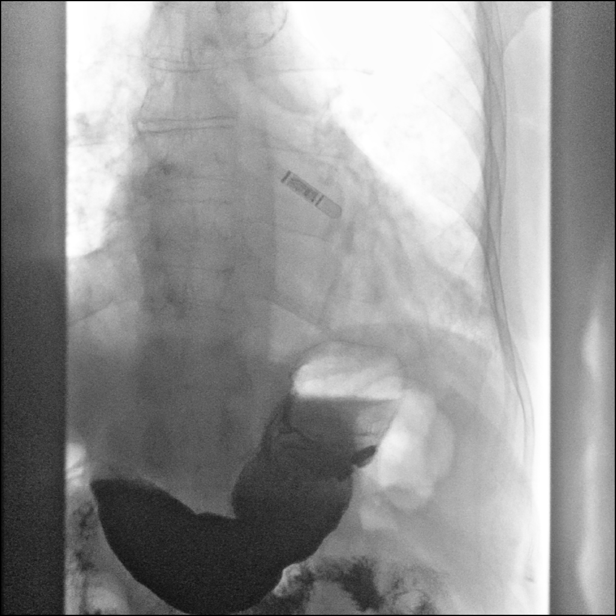

[14 of 24 positions shown; findings below may reference images not displayed]

FINDINGS: Transient laryngeal penetration observed with thin barium, without
tracheobronchial aspiration. Mild barium retention in the vallecula.
No evidence of pharyngeal mass, stricture or diverticulum. No
evidence of cricopharyngeus muscle dysfunction.

Moderate esophageal dysmotility, characterized predominantly by
proximal escape and prominent tertiary contractions. Tiny sliding
hiatal hernia. No gastroesophageal reflux elicited despite
provocative maneuvers including water siphon test. There is a short
segment of moderate smooth circumferential luminal narrowing in the
lower thoracic esophagus near the esophagogastric junction, at which
location the barium tablet became transiently lodged for several
seconds before traversing into the stomach, suggestive of a
stricture. No discrete esophageal mass.
IMPRESSION: 1. Short segment of moderate smooth circumferential luminal
narrowing in the lower thoracic esophagus near the esophagogastric
junction, at which location the barium tablet became transiently
lodged, suggestive of a mild lower thoracic esophageal stricture
without overtly malignant features. No discrete esophageal mass.
This correlates to a site of circumferential wall thickening in the
lower thoracic esophagus seen on [DATE] CT abdomen study.
2. Moderate esophageal dysmotility, with a pattern characteristic of
presbyesophagus.
3. Tiny sliding hiatal hernia.  No gastroesophageal reflux elicited.
4. Transient laryngeal penetration observed, without
tracheobronchial aspiration. Mild barium retention in the vallecula.

## 2020-07-21 ENCOUNTER — Other Ambulatory Visit: Payer: Medicare HMO

## 2020-07-21 DIAGNOSIS — R197 Diarrhea, unspecified: Secondary | ICD-10-CM | POA: Diagnosis not present

## 2020-07-21 DIAGNOSIS — E119 Type 2 diabetes mellitus without complications: Secondary | ICD-10-CM | POA: Diagnosis not present

## 2020-07-21 DIAGNOSIS — K52831 Collagenous colitis: Secondary | ICD-10-CM | POA: Diagnosis not present

## 2020-07-22 ENCOUNTER — Other Ambulatory Visit: Payer: Self-pay | Admitting: Hematology

## 2020-07-22 NOTE — Telephone Encounter (Signed)
Please review - not originally prescribed by Dr. Irene Limbo

## 2020-07-28 ENCOUNTER — Inpatient Hospital Stay: Payer: Medicare HMO

## 2020-07-28 ENCOUNTER — Inpatient Hospital Stay (HOSPITAL_BASED_OUTPATIENT_CLINIC_OR_DEPARTMENT_OTHER): Payer: Medicare HMO | Admitting: Hematology

## 2020-07-28 ENCOUNTER — Other Ambulatory Visit: Payer: Self-pay

## 2020-07-28 DIAGNOSIS — D649 Anemia, unspecified: Secondary | ICD-10-CM

## 2020-07-28 DIAGNOSIS — C91Z0 Other lymphoid leukemia not having achieved remission: Secondary | ICD-10-CM | POA: Diagnosis not present

## 2020-07-28 LAB — CBC WITH DIFFERENTIAL (CANCER CENTER ONLY)
Abs Immature Granulocytes: 0.11 10*3/uL — ABNORMAL HIGH (ref 0.00–0.07)
Basophils Absolute: 0 10*3/uL (ref 0.0–0.1)
Basophils Relative: 0 %
Eosinophils Absolute: 0.2 10*3/uL (ref 0.0–0.5)
Eosinophils Relative: 2 %
HCT: 21.7 % — ABNORMAL LOW (ref 39.0–52.0)
Hemoglobin: 7.1 g/dL — ABNORMAL LOW (ref 13.0–17.0)
Immature Granulocytes: 1 %
Lymphocytes Relative: 52 %
Lymphs Abs: 5.3 10*3/uL — ABNORMAL HIGH (ref 0.7–4.0)
MCH: 30.9 pg (ref 26.0–34.0)
MCHC: 32.7 g/dL (ref 30.0–36.0)
MCV: 94.3 fL (ref 80.0–100.0)
Monocytes Absolute: 1.3 10*3/uL — ABNORMAL HIGH (ref 0.1–1.0)
Monocytes Relative: 12 %
Neutro Abs: 3.5 10*3/uL (ref 1.7–7.7)
Neutrophils Relative %: 33 %
Platelet Count: 241 10*3/uL (ref 150–400)
RBC: 2.3 MIL/uL — ABNORMAL LOW (ref 4.22–5.81)
RDW: 18.3 % — ABNORMAL HIGH (ref 11.5–15.5)
WBC Count: 10.3 10*3/uL (ref 4.0–10.5)
nRBC: 0 % (ref 0.0–0.2)

## 2020-07-28 LAB — PREPARE RBC (CROSSMATCH)

## 2020-07-28 MED ORDER — METHYLPREDNISOLONE SODIUM SUCC 125 MG IJ SOLR
INTRAMUSCULAR | Status: AC
  Filled 2020-07-28: qty 2

## 2020-07-28 MED ORDER — SODIUM CHLORIDE 0.9% IV SOLUTION
250.0000 mL | Freq: Once | INTRAVENOUS | Status: AC
  Administered 2020-07-28: 250 mL via INTRAVENOUS
  Filled 2020-07-28: qty 250

## 2020-07-28 MED ORDER — METHYLPREDNISOLONE SODIUM SUCC 125 MG IJ SOLR
60.0000 mg | Freq: Once | INTRAMUSCULAR | Status: AC
  Administered 2020-07-28: 60 mg via INTRAVENOUS

## 2020-07-28 MED ORDER — SODIUM CHLORIDE 0.9% FLUSH
3.0000 mL | INTRAVENOUS | Status: DC | PRN
  Filled 2020-07-28: qty 10

## 2020-07-28 MED ORDER — ACETAMINOPHEN 325 MG PO TABS
ORAL_TABLET | ORAL | Status: AC
  Filled 2020-07-28: qty 2

## 2020-07-28 MED ORDER — ACETAMINOPHEN 325 MG PO TABS
650.0000 mg | ORAL_TABLET | Freq: Once | ORAL | Status: AC
  Administered 2020-07-28: 650 mg via ORAL

## 2020-07-28 NOTE — Progress Notes (Signed)
HEMATOLOGY/ONCOLOGY CLINIC NOTE  Date of Service: 07/28/2020  Patient Care Team: [facility] [place] as PCP - General (Rheumatology) Collier Bullock, MD as Referring Physician (Hematology and Oncology) [name], DO as Referring Physician (Family Medicine)  CHIEF COMPLAINTS/PURPOSE OF CONSULTATION:  Large Granular Lymphocytic Leukemia  Hem/Onc History  1)- chronic leukocytosis (neutrophilia and absolute lymphocytosis), peripheral blood lymphocytes about 5000, about 1/4 of cells have LGL morphology 2)- macrocytic anemia at least since 09/08/2013 (normal CBC on 11/28/2012), MCV now as high as 115, low retics, marrow M:E ratio 15:1 in 05/2016, erythroid hypoplasia 3)- MGUS serum IgG lambda 0.4 g/dL in 07/2014, follow annually 4)- 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 weekly -03/29/2017 increase dose 25 mg po qweek -07/19/2017 increase mtx 30 mg po qweek, stable dose since then   HISTORY OF PRESENTING ILLNESS:  Tommy Peterson is a wonderful 79 y.o. male who has been referred to [place] by Dr [name] for evaluation and management of Large Granular Lymphocytic Leukemia. The pt reports that he is doing well overall.  Pt is here as he is moving his care because his previous Oncologist, Dr. [name], has changed practices. The pt reports that he needed 2 pints of blood every 2 weeks for a year beginning in 2014. Pt has never needed to any erythropoeitin injections. His Hgb has been holding  higher lately, around 10-11. It has been about 7 months since pt has needed his last blood transfusion. Pt has been taking 30 mg of Methotrexate once per week and denies any issues with that dosage. He has continued taking 1 mg Folic Acid once per day. He is also taking a daily multivitamin.   Pt was previously walking a lot but had to slow down due to fatigue. Pt will need a surgery to repair his left inguinal hernia.   In 2017 pt had a C. Diff infection. Pt had recurrent UTI infections in 2019. His last hospitalization for infections was in January 2020. There were no kidney stones or other reasons for repeat infections. Pt denies any pneumonia infections. Pt has had Diabetes for 40+ years and it is currently well controlled. Pt was never given a direct reason for chronic diarrhea but was placed on Budesonide which stopped his symptoms. Pt is currently taking 3 mg twice per week. He notes that he has been experiencing some urinary frequency, but does feel like he is completely emptying his bladder when he uses the restroom. Pt has seen a Urologist who gave him some medication that caused him to urinate up to 30 times per day. He has tried Flomax previously, and still has some at home, but notes that it does not help much. Pt is following with a Dermatologist , Dr. Rhona Raider, for his skin Squamous Cell Carcinoma. He is currently using Efudex to treat. He was having some night sweats about a year ago, but has had none recently.   Most recent lab results (08/28/2019) of CBC w/diff and CMP is as follows: all values are WNL except for Hgb at  11.8, HCT at 35.1, MCV at 122, MCH at 41.1, RDW at 2.87, RDW at 16.0, Abs Immature Granulocyte at 0.11K, Immature Granulocyte Rel at 1.2, Mono Abs at 1.0K, Creatinine at 0.5, Glucose at 199.  On review of systems, pt reports constipation, fatigue and denies diarrhea, unexpected weight loss, fevers, chills, night sweats, abdominal pain, leg swelling and any other  symptoms.   On PMHx the pt reports BPH, Chronic Diarrhea, Chronic Large Granular Lymphocytic Leukemia, Collagenous Collitis, Clostridium defficile colitis, Chronic UTIs, Transfusion-dependant anemia, Type 2 diabetes mellitus.  INTERVAL HISTORY:  Tommy Peterson is a wonderful 79 y.o. male who is here for evaluation and management of Large Granular Lymphocytic Leukemia. The patient's last visit with [place] was on 06/29/2020. The pt reports that he is doing well overall.  The pt reports that his energy levels have been good. Pt has been taking 10 mg Methotrexate weekly for three weeks. He has noticed an improvement in bowel urgency and is having fewer accidents. Pt is only having a couple of bowel movements per day. Pt is taking 9 mg Budesonide daily. He began taking Mesalamine 8-9 days ago but has not noticed a change since starting. He feels that his appetite is good. He continues take Vitamin B12 and a B-complex daily. Pt was not given a referral to another GI from Dr. Benson Norway.   Lab results today (07/28/20) of CBC w/diff is as follows: all values are WNL except for RBC at 2.30, Hgb at 7.1, HCT at 21.7, RDW at 18.3, Lymphs Abs at 5.3K, Mono Abs at 1.3K, Abs Immature Granulocytes at 0.11K.  On review of systems, pt reports decreased bowel urgency and denies diarrhea, abdominal pain/cramping, bloody stools, fevers, chills, night sweats, unexpected bleeding, new/worsening fatigue and any other symptoms.   SURGICAL HISTORY: Past Surgical History:  Procedure Laterality Date  . BONE MARROW BIOPSY Right 05-24-2016;  08-20-2014  . CARDIOVASCULAR STRESS TEST  11-26-2012   [name]  @ [place]   normal nuclear study w/ no ischemia/  normal LV function and wall motion , ef 60% (find in care everywhere,epic)  . CATARACT EXTRACTION W/ INTRAOCULAR LENS  IMPLANT, BILATERAL  2015  . CYSTOSCOPY WITH LITHOLAPAXY N/A 12/25/2017   Procedure: CYSTOSCOPY WITH LITHOLAPAXY AND FULGERATION;  Surgeon:  [name], MD;  Location: [facility];  Service: Urology;  Laterality: N/A;  . INGUINAL HERNIA REPAIR Right 1980s  . LOOP RECORDER INSERTION N/A 04/10/2018   Procedure: LOOP RECORDER INSERTION;  Surgeon: [name], MD;  Location: [facility];  Service: Cardiovascular;  Laterality: N/A;  . PENILE PROSTHESIS IMPLANT  12-02-2015   dr [name] @ [place] in [place]  . SHOULDER SURGERY Left 08/2016  . TONSILLECTOMY  child  . TRANSURETHRAL RESECTION OF PROSTATE  2009   AND REPAIR RECURRENT RIGHT INGUINAL HERNIA    SOCIAL HISTORY: Social History   Socioeconomic History  . Marital status: Widowed    Spouse name: engaged  . Number of children: 8  . Years of education: college-2  . Highest education level: Not on file  Occupational History  . Occupation: Press photographer  . 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 Smoking status: Former Smoker    Years: 20.00    Types: Cigarettes    Quit date: 05/04/1996    Years since quitting: 24.2  . Smokeless tobacco: Never Used  Vaping Use  . Vaping Use: Never used  Substance and Sexual Activity  .  Alcohol use: Yes    Comment: 1-2 glasses of wine every other day  . Drug use: No  . Sexual activity: Never  Other Topics Concern  . Not on file  Social History Narrative  . Not on file   Social Determinants of Health   Financial Resource Strain: Not on file  Food Insecurity: Not on file  Transportation Needs: Not on file  Physical Activity: Not on file  Stress: Not on file  Social Connections: Not on file  Intimate Partner Violence: Not on file    FAMILY HISTORY: Family History  Problem Relation Age of Onset  . Diabetes Mother   . Heart failure Mother     ALLERGIES:  is allergic to bee venom, metformin and related, and sulfamethoxazole-trimethoprim.  MEDICATIONS:  Current Outpatient Medications  Medication Sig Dispense Refill  . acetaminophen (TYLENOL) 325 MG tablet Take 2 tablets (650 mg  total) by mouth every 6 (six) hours as needed for mild pain (or Fever >/= 101).    Marland Kitchen albuterol (VENTOLIN HFA) 108 (90 Base) MCG/ACT inhaler Inhale 2 puffs into the lungs every 6 (six) hours. 8 g 1  . b complex vitamins capsule Take 1 capsule by mouth daily.    . budesonide (ENTOCORT EC) 3 MG 24 hr capsule Take 6 mg by mouth daily.    . Cranberry 400 MG TABS Take 1 tablet by mouth daily.     . D-Mannose 500 MG CAPS Take 500 mg by mouth daily.    . folic acid (FOLVITE) 1 MG tablet TAKE 1 TABLET BY MOUTH EVERY DAY 90 tablet 3  . guaiFENesin-dextromethorphan (ROBITUSSIN DM) 100-10 MG/5ML syrup Take 10 mLs by mouth every 4 (four) hours as needed for cough. 118 mL 0  . Insulin Glargine-Lixisenatide (SOLIQUA) 100-33 UNT-MCG/ML SOPN Inject 28 Units into the skin at bedtime.     Marland Kitchen NOVOLOG FLEXPEN 100 UNIT/ML FlexPen Inject 4 Units into the skin in the morning and at bedtime.     . pioglitazone (ACTOS) 15 MG tablet Take 15 mg by mouth every morning.     . polyethylene glycol (MIRALAX / GLYCOLAX) 17 g packet Take 17 g by mouth 2 (two) times daily. 14 each 0  . vitamin B-12 (CYANOCOBALAMIN) 100 MCG tablet Take 100 mcg by mouth daily.     No current facility-administered medications for this visit.   Facility-Administered Medications Ordered in Other Visits  Medication Dose Route Frequency Provider Last Rate Last Admin  . sodium chloride flush (NS) 0.9 % injection 3 mL  3 mL Intracatheter PRN Irene Limbo, Cloria Spring, MD        REVIEW OF SYSTEMS:   A 10+ POINT REVIEW OF SYSTEMS WAS OBTAINED including neurology, dermatology, psychiatry, cardiac, respiratory, lymph, extremities, GI, GU, Musculoskeletal, constitutional, breasts, reproductive, HEENT.  All pertinent positives are noted in the HPI.  All others are negative.   PHYSICAL EXAMINATION: ECOG PERFORMANCE STATUS: 1 - Symptomatic but completely ambulatory  Exam was given in a chair   GENERAL:alert, in no acute distress and comfortable SKIN: no acute  rashes, no significant lesions EYES: conjunctiva are pink and non-injected, sclera anicteric OROPHARYNX: MMM, no exudates, no oropharyngeal erythema or ulceration NECK: supple, no JVD LYMPH:  no palpable lymphadenopathy in the cervical, axillary or inguinal regions LUNGS: clear to auscultation b/l with normal respiratory effort HEART: regular rate & rhythm ABDOMEN:  normoactive bowel sounds , non tender, not distended. No palpable hepatosplenomegaly.  Extremity: no pedal edema PSYCH: alert & oriented x 3 with  fluent speech NEURO: no focal motor/sensory deficits  LABORATORY DATA:  I have reviewed the data as listed  . CBC Latest Ref Rng & Units 07/28/2020 07/15/2020 06/29/2020  WBC 4.0 - 10.5 K/uL 10.3 12.1(H) 9.4  Hemoglobin 13.0 - 17.0 g/dL 7.1(L) 8.1(L) 7.8(L)  Hematocrit 39.0 - 52.0 % 21.7(L) 24.9(L) 23.1(L)  Platelets 150 - 400 K/uL 241 312 284    . CMP Latest Ref Rng & Units 04/17/2020 04/10/2020 04/09/2020  Glucose 70 - 99 mg/dL 324(H) 167(H) 149(H)  BUN 8 - 23 mg/dL _0 Creatinine 0.61 - 1.24 mg/dL 0.52(L) 0.48(L) 0.44(L)  Sodium 135 - 145 mmol/L 135 138 139  Potassium 3.5 - 5.1 mmol/L 4.1 3.9 4.4  Chloride 98 - 111 mmol/L 102 105 107  CO2 22 - 32 mmol/L _1 Calcium 8.9 - 10.3 mg/dL 8.1(L) 8.2(L) 8.2(L)  Total Protein 6.5 - 8.1 g/dL 6.1(L) 5.7(L) 5.6(L)  Total Bilirubin 0.3 - 1.2 mg/dL 1.4(H) 0.8 0.8  Alkaline Phos 38 - 126 U/L 58 50 47  AST 15 - 41 U/L 21 32 34  ALT 0 - 44 U/L 40 52(H) 43   09/28/2016 Flow Cytometry:   RADIOGRAPHIC STUDIES: I have personally reviewed the radiological images as listed and agreed with the findings in the report. DG ESOPHAGUS W DOUBLE CM (HD)  Result Date: 07/19/2020 CLINICAL DATA:  New odynophagia. Chronic globus sensation in the throat. EXAM: ESOPHOGRAM / BARIUM SWALLOW / BARIUM TABLET STUDY TECHNIQUE: Single contrast examination performed using thin barium liquid. The patient was observed with fluoroscopy swallowing a 13  mm barium sulphate tablet. FLUOROSCOPY TIME:  Fluoroscopy Time:  3 minutes 24 seconds Radiation Exposure Index (if provided by the fluoroscopic device): 431 mGy Number of Acquired Spot Images: 5 COMPARISON:  05/14/2020 CT abdomen/pelvis. 06/13/2016 modified barium swallow. FINDINGS: Transient laryngeal penetration observed with thin barium, without tracheobronchial aspiration. Mild barium retention in the vallecula. No evidence of pharyngeal mass, stricture or diverticulum. No evidence of cricopharyngeus muscle dysfunction. Moderate esophageal dysmotility, characterized predominantly by proximal escape and prominent tertiary contractions. Tiny sliding hiatal hernia. No gastroesophageal reflux elicited despite provocative maneuvers including water siphon test. There is a short segment of moderate smooth circumferential luminal narrowing in the lower thoracic esophagus near the esophagogastric junction, at which location the barium tablet became transiently lodged for several seconds before traversing into the stomach, suggestive of a stricture. No discrete esophageal mass. IMPRESSION: 1. Short segment of moderate smooth circumferential luminal narrowing in the lower thoracic esophagus near the esophagogastric junction, at which location the barium tablet became transiently lodged, suggestive of a mild lower thoracic esophageal stricture without overtly malignant features. No discrete esophageal mass. This correlates to a site of circumferential wall thickening in the lower thoracic esophagus seen on 05/14/2020 CT abdomen study. 2. Moderate esophageal dysmotility, with a pattern characteristic of presbyesophagus. 3. Tiny sliding hiatal hernia.  No gastroesophageal reflux elicited. 4. Transient laryngeal penetration observed, without tracheobronchial aspiration. Mild barium retention in the vallecula. Electronically Signed   By: Ilona Sorrel M.D.   On: 07/19/2020 08:58    ASSESSMENT & PLAN:   79 yo with   1) T  cell large granular leukemia s/p transfusion dependent anemia - previous blood transfusion dependent and was last on MTX 56m po weekly a few months ago 2) IgG Lambda MGUS 3) recent COVID-19 infection PLAN: -Discussed pt labwork today, 07/28/20; WBC & PLT are nml - bone marrow is not suppressed, Hgb is stable overall. -No lab  or clinical evidence of Large Granular Lymphocytic Leukemia progression at this time. Will continue watchful observation. -Advised pt that he has Microscopic Colitis, which is treated with steroids and immunosuppressants.  -Discussed CDC guidelines regarding the [place] booster. Will give in clinic today.  -Recommend pt f/u with Dr. Benson Norway for management of microscopic colitis. -Recommend pt continue 10 mg Methotrexate weekly.  -Continue 2 mg Folic acid, Vitamin Z61, and B-complex daily -Continue 2 units of PRBC q2weeks with labs -Will see back in 4 weeks    FOLLOW UP: -PLz schedule for labs and 2 units of PRBC every 2 weeks x 3 -MD visit in 4 weeks   The total time spent in the appt was 20 minutes and more than 50% was on counseling and direct patient cares.  All of the patient's questions were answered with apparent satisfaction. The patient knows to call the clinic with any problems, questions or concerns.   Sullivan Lone MD [place] AAHIVMS [facility] Hematology/Oncology Physician [facility]  (Office):       [phone] (Work cell):  [phone] (Fax):           [phone]  07/28/2020 3:14 PM  I, Yevette Edwards, am acting as a scribe for Dr. Sullivan Lone.   .I have reviewed the above documentation for accuracy and completeness, and I agree with the above. Brunetta Genera MD

## 2020-07-28 NOTE — Patient Instructions (Signed)

## 2020-07-29 ENCOUNTER — Ambulatory Visit: Payer: Self-pay | Admitting: Neurology

## 2020-07-29 LAB — TYPE AND SCREEN
ABO/RH(D): O POS
Antibody Screen: NEGATIVE
Unit division: 0
Unit division: 0

## 2020-07-29 LAB — BPAM RBC
Blood Product Expiration Date: 202201232359
Blood Product Expiration Date: 202201242359
ISSUE DATE / TIME: 202112221341
ISSUE DATE / TIME: 202112221341
Unit Type and Rh: 5100
Unit Type and Rh: 5100

## 2020-08-05 ENCOUNTER — Telehealth: Payer: Self-pay | Admitting: Hematology

## 2020-08-05 DIAGNOSIS — T83490A Other mechanical complication of penile (implanted) prosthesis, initial encounter: Secondary | ICD-10-CM | POA: Diagnosis not present

## 2020-08-05 NOTE — Telephone Encounter (Signed)
Scheduled per los, patient has been called and notified of upcoming appointments. 

## 2020-08-11 ENCOUNTER — Inpatient Hospital Stay: Payer: Medicare HMO

## 2020-08-11 ENCOUNTER — Other Ambulatory Visit: Payer: Self-pay

## 2020-08-11 ENCOUNTER — Inpatient Hospital Stay: Payer: Medicare HMO | Attending: Hematology

## 2020-08-11 VITALS — BP 123/80 | HR 115 | Temp 98.5°F | Resp 18

## 2020-08-11 DIAGNOSIS — Z8616 Personal history of COVID-19: Secondary | ICD-10-CM | POA: Insufficient documentation

## 2020-08-11 DIAGNOSIS — Z794 Long term (current) use of insulin: Secondary | ICD-10-CM | POA: Diagnosis not present

## 2020-08-11 DIAGNOSIS — D649 Anemia, unspecified: Secondary | ICD-10-CM

## 2020-08-11 DIAGNOSIS — Z79899 Other long term (current) drug therapy: Secondary | ICD-10-CM | POA: Insufficient documentation

## 2020-08-11 DIAGNOSIS — I4891 Unspecified atrial fibrillation: Secondary | ICD-10-CM | POA: Diagnosis not present

## 2020-08-11 DIAGNOSIS — N400 Enlarged prostate without lower urinary tract symptoms: Secondary | ICD-10-CM | POA: Insufficient documentation

## 2020-08-11 DIAGNOSIS — Z7901 Long term (current) use of anticoagulants: Secondary | ICD-10-CM | POA: Diagnosis not present

## 2020-08-11 DIAGNOSIS — D472 Monoclonal gammopathy: Secondary | ICD-10-CM | POA: Diagnosis not present

## 2020-08-11 DIAGNOSIS — K529 Noninfective gastroenteritis and colitis, unspecified: Secondary | ICD-10-CM | POA: Diagnosis not present

## 2020-08-11 DIAGNOSIS — E118 Type 2 diabetes mellitus with unspecified complications: Secondary | ICD-10-CM | POA: Insufficient documentation

## 2020-08-11 DIAGNOSIS — C91Z0 Other lymphoid leukemia not having achieved remission: Secondary | ICD-10-CM | POA: Insufficient documentation

## 2020-08-11 DIAGNOSIS — Z87891 Personal history of nicotine dependence: Secondary | ICD-10-CM | POA: Insufficient documentation

## 2020-08-11 LAB — CBC WITH DIFFERENTIAL (CANCER CENTER ONLY)
Abs Immature Granulocytes: 0.19 10*3/uL — ABNORMAL HIGH (ref 0.00–0.07)
Basophils Absolute: 0 10*3/uL (ref 0.0–0.1)
Basophils Relative: 0 %
Eosinophils Absolute: 0.2 10*3/uL (ref 0.0–0.5)
Eosinophils Relative: 2 %
HCT: 23.4 % — ABNORMAL LOW (ref 39.0–52.0)
Hemoglobin: 7.7 g/dL — ABNORMAL LOW (ref 13.0–17.0)
Immature Granulocytes: 2 %
Lymphocytes Relative: 40 %
Lymphs Abs: 4.7 10*3/uL — ABNORMAL HIGH (ref 0.7–4.0)
MCH: 28.5 pg (ref 26.0–34.0)
MCHC: 32.9 g/dL (ref 30.0–36.0)
MCV: 86.7 fL (ref 80.0–100.0)
Monocytes Absolute: 1.5 10*3/uL — ABNORMAL HIGH (ref 0.1–1.0)
Monocytes Relative: 13 %
Neutro Abs: 5.2 10*3/uL (ref 1.7–7.7)
Neutrophils Relative %: 43 %
Platelet Count: 325 10*3/uL (ref 150–400)
RBC: 2.7 MIL/uL — ABNORMAL LOW (ref 4.22–5.81)
RDW: 17.1 % — ABNORMAL HIGH (ref 11.5–15.5)
WBC Count: 11.7 10*3/uL — ABNORMAL HIGH (ref 4.0–10.5)
nRBC: 0 % (ref 0.0–0.2)

## 2020-08-11 LAB — SAMPLE TO BLOOD BANK

## 2020-08-11 LAB — PREPARE RBC (CROSSMATCH)

## 2020-08-11 MED ORDER — METHYLPREDNISOLONE SODIUM SUCC 125 MG IJ SOLR
INTRAMUSCULAR | Status: AC
  Filled 2020-08-11: qty 2

## 2020-08-11 MED ORDER — ACETAMINOPHEN 325 MG PO TABS
650.0000 mg | ORAL_TABLET | Freq: Once | ORAL | Status: AC
  Administered 2020-08-11: 650 mg via ORAL

## 2020-08-11 MED ORDER — METHYLPREDNISOLONE SODIUM SUCC 125 MG IJ SOLR
60.0000 mg | Freq: Once | INTRAMUSCULAR | Status: AC
  Administered 2020-08-11: 60 mg via INTRAVENOUS

## 2020-08-11 MED ORDER — SODIUM CHLORIDE 0.9% IV SOLUTION
250.0000 mL | Freq: Once | INTRAVENOUS | Status: DC
  Filled 2020-08-11: qty 250

## 2020-08-11 MED ORDER — ACETAMINOPHEN 325 MG PO TABS
ORAL_TABLET | ORAL | Status: AC
  Filled 2020-08-11: qty 2

## 2020-08-11 MED ORDER — SODIUM CHLORIDE 0.9 % IV SOLN
Freq: Once | INTRAVENOUS | Status: AC
  Filled 2020-08-11: qty 250

## 2020-08-11 NOTE — Patient Instructions (Signed)

## 2020-08-11 NOTE — Progress Notes (Signed)
Upon arrival, patient was tachycardic with HR of 131. Dr. Candise Che made aware, as well as Warden Fillers, RN- symptom management RN. Patient denied chest pain, dizziness, or any other symptoms. Upon departure of infusion clinic, patient was still tachycardic with HR of 115, patient denied chest pain, dizziness or any other symptoms. Patient ambulatory and with no complaints after blood transfusion.

## 2020-08-12 LAB — BPAM RBC
Blood Product Expiration Date: 202202042359
ISSUE DATE / TIME: 202201051319
Unit Type and Rh: 5100

## 2020-08-12 LAB — TYPE AND SCREEN
ABO/RH(D): O POS
Antibody Screen: NEGATIVE
Unit division: 0

## 2020-08-17 DIAGNOSIS — Y828 Other medical devices associated with adverse incidents: Secondary | ICD-10-CM | POA: Diagnosis not present

## 2020-08-17 DIAGNOSIS — T83490A Other mechanical complication of penile (implanted) prosthesis, initial encounter: Secondary | ICD-10-CM | POA: Diagnosis not present

## 2020-08-17 DIAGNOSIS — Z539 Procedure and treatment not carried out, unspecified reason: Secondary | ICD-10-CM | POA: Diagnosis not present

## 2020-08-18 ENCOUNTER — Ambulatory Visit (INDEPENDENT_AMBULATORY_CARE_PROVIDER_SITE_OTHER): Payer: Medicare HMO | Admitting: Cardiovascular Disease

## 2020-08-18 ENCOUNTER — Encounter: Payer: Self-pay | Admitting: Cardiovascular Disease

## 2020-08-18 ENCOUNTER — Other Ambulatory Visit: Payer: Self-pay

## 2020-08-18 VITALS — BP 128/70 | HR 143 | Ht 69.5 in | Wt 167.0 lb

## 2020-08-18 DIAGNOSIS — I480 Paroxysmal atrial fibrillation: Secondary | ICD-10-CM

## 2020-08-18 DIAGNOSIS — E119 Type 2 diabetes mellitus without complications: Secondary | ICD-10-CM | POA: Diagnosis not present

## 2020-08-18 DIAGNOSIS — C91Z0 Other lymphoid leukemia not having achieved remission: Secondary | ICD-10-CM

## 2020-08-18 DIAGNOSIS — I700 Atherosclerosis of aorta: Secondary | ICD-10-CM | POA: Diagnosis not present

## 2020-08-18 DIAGNOSIS — Z4509 Encounter for adjustment and management of other cardiac device: Secondary | ICD-10-CM | POA: Diagnosis not present

## 2020-08-18 MED ORDER — METOPROLOL TARTRATE 50 MG PO TABS: 50.0000 mg | ORAL_TABLET | Freq: Two times a day (BID) | ORAL | 11 refills | Status: DC

## 2020-08-18 MED ORDER — APIXABAN 5 MG PO TABS: 5.0000 mg | ORAL_TABLET | Freq: Two times a day (BID) | ORAL | 11 refills | Status: DC

## 2020-08-18 NOTE — Patient Instructions (Signed)
Medication Instructions:  START Eliquis 5 mg twice daily START Metoprolol Tartrate 50 mg twice daily  *If you need a refill on your cardiac medications before your next appointment, please call your pharmacy*   Lab Work: None ordered If you have labs (blood work) drawn today and your tests are completely normal, you will receive your results only by: Marland Kitchen MyChart Message (if you have MyChart) OR . A paper copy in the mail If you have any lab test that is abnormal or we need to change your treatment, we will call you to review the results.   Testing/Procedures: Your physician has requested that you have an echocardiogram. Echocardiography is a painless test that uses sound waves to create images of your heart. It provides your doctor with information about the size and shape of your heart and how well your heart's chambers and valves are working. You may receive an ultrasound enhancing agent through an IV if needed to better visualize your heart during the echo.This procedure takes approximately one hour. There are no restrictions for this procedure. This will take place at the [address].     Follow-Up: At [facility], you and your health needs are our priority.  As part of our continuing mission to provide you with exceptional heart care, we have created designated Provider Care Teams.  These Care Teams include your primary Cardiologist (physician) and Advanced Practice Providers (APPs -  Physician Assistants and Nurse Practitioners) who all work together to provide you with the care you need, when you need it.  We recommend signing up for the patient portal called "MyChart".  Sign up information is provided on this After Visit Summary.  MyChart is used to connect with patients for Virtual Visits (Telemedicine).  Patients are able to view lab/test results, encounter notes, upcoming appointments, etc.  Non-urgent messages can be sent to your provider as well.   To learn more  about what you can do with MyChart, go to [email or web address].    Your next appointment:   Follow up with Dr. Sallyanne Kuster next week

## 2020-08-18 NOTE — Progress Notes (Signed)
Cardiology Office Note:    Date:  08/23/2020   ID:  Tommy Peterson, DOB 08-03-1941, MRN [id number]  PCP:  [facility]  Cardiologist:  Etan Vasudevan  Chief Complaint  Patient presents with  . Follow-up    Last seen in 2019.  Marland Kitchen Shortness of Breath  . Edema    Right ankle.  . Atrial Fibrillation    History of Present Illness:    Tommy Peterson is a 80 y.o. 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  A loop recorder was implanted in 2019 and for couple of years did not show any rhythm irregularities, other than paroxysmal atrial tachycardia.  At the patient's request, we stopped performing regular downloads to avoid billing.  He is very reluctant in the past to undergo anticoagulation due to problems with anemia, which was prbably related to his leukemia and its treatment.  He has a dislocated penile implant reservoir and was planning to undergo surgery with Dr. Ky Barban [facility]).  The procedure was canceled when he was found to be in atrial fibrillation with rapid ventricular response.  He presents today still in atrial fibrillation rapid ventricular response.  He is completely unaware of palpitations and cannot tell that his rhythm is abnormal.  Ventricular rate is 143 at rest, but he does not appear to be in any respiratory difficulty or distress.  He does note some shortness of breath with activity and slight swelling in his right ankle, but does not have orthopnea, PND, angina at rest or with activity, dizziness, syncope or any focal neurological complaints.  He has not had any bleeding issues.  Interrogation of his loop recorder shows that he has had 2 distinct episodes of prolonged  fibrillation.  1st through January 5, the other 1 started on January 18 and is ongoing at the time of this appointment on January 12.  Ventricular rate control is poor  His next treatment for his leukemia and follow-up with his oncologist Dr. Irene Limbo is scheduled for January 18.  On January 5, his hemoglobin was 7.7, platelet count was normal at 325k.  At worst, his CHA2DS2-VASc Score =4(age 36, HTN -questionable dx, DM), 4.8% stroke risk per year.Today's blood pressure is normal without any medications and he probably has a CHA2DS2-VASc score of 3.  HASBLED 1.   He does not have a history of coronary artery disease or peripheral arterial disease.  In 2017 he was admitted with an episode of chest pain and his work-up for ischemia was negative.  Nevertheless, there was evidence of aortic and coronary calcifications on a CT of the chest performed in January 2018.  Past Medical History:  Diagnosis Date  . Acute respiratory failure ([place])   . BCC (basal cell carcinoma of skin) 08/15/2016   w/SCC Left Forehead (treatment @ [facility])  . BPH (benign prostatic hyperplasia)   . Carpal tunnel syndrome on both sides   . Chronic diarrhea    intermittant due to chronic collagenous colitis  . Chronic large granular lymphocytic leukemia ([place]) primary hemotologist-  [name]. 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  Collagenous colitis    chronic--- intermittant between diarrahea and constipation  . ED (erectile dysfunction)   . HCAP (healthcare-associated pneumonia)   . History of adenomatous polyp of colon   . History of Clostridium difficile colitis 05/2016  . History of pneumonia 11/08/2017   CAP -- LLL---  12-19-2017  per pt no residual symptoms  . History of SCC (squamous cell  carcinoma) of skin   . History of sepsis    11-20-2017 severe sepsis due to UTI;  10/ 2017sepsis due to c-diff colitis  . Leucocytosis    chronic  . Lower urinary tract symptoms (LUTS)   . Macrocytic anemia    since 02/ 2015  . MGUS (monoclonal gammopathy of unknown significance)    hemotologist-  [name] @ [facility]  . Monoclonal paraproteinemia   . Neuropathy, peripheral   . Nodular basal cell carcinoma (BCC) 10/01/2018   Left Neck(Nodular) Pt Does Not Want Treatment  . Nodular basal cell carcinoma (BCC) 10/01/2018   Mid Forehead (treament curet and excision)  . OA (osteoarthritis)   . Prostatic stone   . Rash of face    RIGHT SIDE  . Raynaud's syndrome   . Recurrent BCC (basal cell carcinoma) 03/05/2019   positive margin  . SCC (squamous cell carcinoma) 08/10/2009   Central Forehead (Moh's Dr. Rhona Raider @ [facility])  . SCC (squamous cell carcinoma) 12/08/2011   CIS-Left Cheek (treatment Aldara @ [facility])  . SCC (squamous cell carcinoma) 07/09/2015   CIS-Left Forehead (treament @ [facility])  . Septic shock (HCC)    x 3 w/UTI  . Squamous cell carcinoma in situ (SCCIS) 12/08/2011   Left Cheek (treatment Aldara @ [facility])   . Superficial basal cell carcinoma (BCC) 10/01/2018   Left Shin-Pt does not want treatment  . Thin skin    fragile  . Transfusion-dependent anemia since 10/ 2017   last transfusion PRBCs 07-19-2017  per hemologist note dated 10-25-2017  . Type 2 diabetes mellitus treated with insulin ([place])    followed by [name] (pcp)  . Urinary hesitancy   . Wears contact lenses    LEFT EYE ONLY    Past Surgical History:  Procedure Laterality Date  . BONE MARROW BIOPSY Right 05-24-2016;  08-20-2014  . CARDIOVASCULAR STRESS TEST  11-26-2012   [name]  @ [place]   normal nuclear study w/ no ischemia/  normal LV function and wall motion , ef 60% (find in care everywhere,epic)  . CATARACT EXTRACTION W/ INTRAOCULAR LENS  IMPLANT,  BILATERAL  2015  . CYSTOSCOPY WITH LITHOLAPAXY N/A 12/25/2017   Procedure: CYSTOSCOPY WITH LITHOLAPAXY AND FULGERATION;  Surgeon: [name], MD;  Location: [facility];  Service: Urology;  Laterality: N/A;  . INGUINAL HERNIA REPAIR Right 1980s  . LOOP RECORDER INSERTION N/A 04/10/2018   Procedure: LOOP RECORDER INSERTION;  Surgeon: [name], MD;  Location: [place] [facility];  Service: Cardiovascular;  Laterality: N/A;  . PENILE PROSTHESIS IMPLANT  12-02-2015   dr [name] @ [place] in [place]  . SHOULDER SURGERY Left 08/2016  . TONSILLECTOMY  child  . TRANSURETHRAL RESECTION OF PROSTATE  2009   AND REPAIR RECURRENT RIGHT INGUINAL HERNIA    Current Medications: Current Meds  Medication Sig  . acetaminophen (TYLENOL) 325 MG tablet Take 2 tablets (650 mg total) by mouth every 6 (six) hours as needed for mild pain (or Fever >/= 101).  Marland Kitchen albuterol (VENTOLIN HFA) 108 (  90 Base) MCG/ACT inhaler Inhale 2 puffs into the lungs every 6 (six) hours.  Marland Kitchen apixaban (ELIQUIS) 5 MG TABS tablet Take 1 tablet (5 mg total) by mouth 2 (two) times daily.  Marland Kitchen b complex vitamins capsule Take 1 capsule by mouth daily.  . budesonide (ENTOCORT EC) 3 MG 24 hr capsule Take 9 mg by mouth daily.  . Cranberry 400 MG TABS Take 1 tablet by mouth daily.   . D-Mannose 500 MG CAPS Take 500 mg by mouth daily.  . folic acid (FOLVITE) 1 MG tablet TAKE 1 TABLET BY MOUTH EVERY DAY  . guaiFENesin-dextromethorphan (ROBITUSSIN DM) 100-10 MG/5ML syrup Take 10 mLs by mouth every 4 (four) hours as needed for cough.  . Insulin Glargine-Lixisenatide 100-33 UNT-MCG/ML SOPN Inject 26 Units into the skin at bedtime.  . methotrexate (RHEUMATREX) 15 MG tablet Take 15 mg by mouth once a week. Caution: Chemotherapy. Protect from light.  . metoprolol tartrate (LOPRESSOR) 50 MG tablet Take 1 tablet (50 mg total) by mouth 2 (two) times daily.  Marland Kitchen NOVOLOG FLEXPEN 100 UNIT/ML FlexPen Inject 5 Units into the skin in  the morning and at bedtime.  . OXYBUTYNIN CHLORIDE PO Take 1 tablet by mouth daily.  . pioglitazone (ACTOS) 15 MG tablet Take 15 mg by mouth every morning.   . polyethylene glycol (MIRALAX / GLYCOLAX) 17 g packet Take 17 g by mouth 2 (two) times daily.  . vitamin B-12 (CYANOCOBALAMIN) 100 MCG tablet Take 100 mcg by mouth daily.     Allergies:   Bee venom, Metformin and related, and Sulfamethoxazole-trimethoprim   Social History   Socioeconomic History  . Marital status: Widowed    Spouse name: engaged  . Number of children: 8  . Years of education: college-2  . Highest education level: Not on file  Occupational History  . Occupation: Press photographer  . 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 Smoking status: Former Smoker    Years: 20.00    Types: Cigarettes    Quit date: 05/04/1996    Years since quitting: 24.3  . Smokeless tobacco: Never Used  Vaping Use  . Vaping Use: Never used  Substance and Sexual Activity  . Alcohol use: Yes    Comment: 1-2 glasses of wine every other day  . Drug use: No  . Sexual activity: Never  Other Topics Concern  . Not on file  Social History Narrative  . Not on file   Social Determinants of Health   Financial Resource Strain: Not on file  Food Insecurity: Not on file  Transportation Needs: Not on file  Physical Activity: Not on file  Stress: Not on file  Social Connections: Not on file     Family History: The patient's family history includes Diabetes in his mother; Heart failure in his mother.  ROS:   Please see the history of present illness.   All other systems are reviewed and are negative.  EKGs/Labs/Other Studies Reviewed:    The following studies were reviewed today: Comprehensive download of his loop recorder in the office today. ECHO 02/20/2018  - Left ventricle: The cavity size was normal. Wall thickness was   increased in a pattern of moderate LVH. Systolic function was   normal. The estimated ejection fraction was  in the range of 60%   to 65%. Wall motion was normal; there were no regional wall   motion abnormalities. - Aortic valve: Transvalvular velocity was within the normal range.   There was no stenosis. There  was no regurgitation. - Mitral valve: Transvalvular velocity was within the normal range.   There was no evidence for stenosis. There was trivial   regurgitation. - Left atrium: The atrium was moderately dilated. - Right ventricle: The cavity size was normal. Wall thickness was   normal. Systolic function was normal. - Right atrium: The atrium was moderately dilated. - Atrial septum: No defect or patent foramen ovale was identified   by color flow Doppler. - Tricuspid valve: There was mild regurgitation. - Pulmonary arteries: Systolic pressure was mildly increased. PA   peak pressure: 44 mm Hg (S).   EKG:  EKG is ordered today.  The ECG ordered today shows atrial fibrillation with rapid ventricular response and nonspecific ST-T changes, a QS pattern in leads V1-V2, borderline QT at 469 ms.  Recent Labs: 04/10/2020: Magnesium 2.3 04/17/2020: ALT 40; BUN 15; Creatinine, Ser 0.52; Potassium 4.1; Sodium 135 08/11/2020: Hemoglobin 7.7; Platelet Count 325  Recent Lipid Panel    Component Value Date/Time   CHOL 111 05/05/2016 0402   TRIG 111 04/07/2020 0055   HDL 27 (L) 05/05/2016 0402   CHOLHDL 4.1 05/05/2016 0402   VLDL 21 05/05/2016 0402   LDLCALC 63 05/05/2016 0402    Physical Exam:    VS:  BP 128/70 (BP Location: Left Arm, Patient Position: Sitting, Cuff Size: Normal)   Pulse (!) 143   Ht 5' 9.5" (1.765 m)   Wt 167 lb (75.8 kg)   BMI 24.31 kg/m     Wt Readings from Last 3 Encounters:  08/18/20 167 lb (75.8 kg)  06/29/20 168 lb 12 oz (76.5 kg)  06/18/20 168 lb 14.4 oz (76.6 kg)      General: Alert, oriented x3,  appears slightly pale, but in no distress whatsoever Head: no evidence of trauma, PERRL, EOMI, no exophtalmos or lid lag, no myxedema, no xanthelasma; normal  ears, nose and oropharynx Neck: normal jugular venous pulsations and no hepatojugular reflux; brisk carotid pulses without delay and no carotid bruits Chest: clear to auscultation, no signs of consolidation by percussion or palpation, normal fremitus, symmetrical and full respiratory excursions Cardiovascular: normal position and quality of the apical impulse, rapid irregular rhythm, normal first and second heart sounds, no murmurs, rubs or gallops Abdomen: no tenderness or distention, no masses by palpation, no abnormal pulsatility or arterial bruits, normal bowel sounds, no hepatosplenomegaly Extremities: no clubbing, cyanosis or edema; 2+ radial, ulnar and brachial pulses bilaterally; 2+ right femoral, posterior tibial and dorsalis pedis pulses; 2+ left femoral, posterior tibial and dorsalis pedis pulses; no subclavian or femoral bruits Neurological: grossly nonfocal Psych: Normal mood and affect    ASSESSMENT:    1. Paroxysmal atrial fibrillation (HCC)   2. Encounter for loop recorder check   3. Large granular lymphocytic leukemia ([place])   4. Type 2 diabetes mellitus without complication, without long-term current use of insulin ([place])   5. Aortic atherosclerosis (HCC)    PLAN:    In order of problems listed above:  1. AFib: Despite being free of atrial fibrillation for the last couple of years, he has had 2 prolonged episodes of atrial fibrillation with rapid ventricular response that occurred in quick succession the last 2 weeks.  He remains in atrial fibrillation today but is tolerating it quite well.  Discussed the need for better ventricular rate control to avoid tachycardia-related cardiomyopathy as well as the need for coagulation to reduce risk of embolic events, including stroke.  Urgent cardioversion does not appear to be necessary,  nor would it be safe without 1st initiating anticoagulation for several weeks.  We will recheck a transthoracic echocardiogram to look for any changes  that could explain the sudden increase in arrhythmia prevalence.  Start apixaban and metoprolol. 2. ILR: Well-healed site.  Device is still operating normally.  We had discontinued monthly downloads at the patient's request, but will restart monthly follow-up via the device clinic since he has recurrent arrhythmia. 3. Leukemia/chronic anemia: Due to baseline anemia, hemorrhage would have more serious implications.,  His only risk factor for serious bleeding is his chronological age (biologically he is much younger).  He has his follow-up with hematology/oncology next week. 4. DM: Fair control with the most recent hemoglobin A1c 7.5.  His weight is.  He is quite active for his age although he does not engage in regular exercise.  No known history of coronary or peripheral vascular disease.  He is on Actos without evidence of fluid overload/heart failure.  Excellent renal function. 5. Aortic and coronary atherosclerosis: Noted on CT chest.  He has never had angina pectoris, but he has never had any functional studies for CAD either.  LDL cholesterol was 63 in 2017 and 70 in 2019, without medications.  He has normal left ventricular systolic function.  All his risk factors appear to be adequately addressed.   Medication Adjustments/Labs and Tests Ordered: Current medicines are reviewed at length with the patient today.  Concerns regarding medicines are outlined above.  Orders Placed This Encounter  Procedures  . EKG 12-Lead  . ECHOCARDIOGRAM COMPLETE   Meds ordered this encounter  Medications  . apixaban (ELIQUIS) 5 MG TABS tablet    Sig: Take 1 tablet (5 mg total) by mouth 2 (two) times daily.    Dispense:  60 tablet    Refill:  11  . metoprolol tartrate (LOPRESSOR) 50 MG tablet    Sig: Take 1 tablet (50 mg total) by mouth 2 (two) times daily.    Dispense:  60 tablet    Refill:  11    Patient Instructions  Medication Instructions:  START Eliquis 5 mg twice daily START Metoprolol Tartrate  50 mg twice daily  *If you need a refill on your cardiac medications before your next appointment, please call your pharmacy*   Lab Work: None ordered If you have labs (blood work) drawn today and your tests are completely normal, you will receive your results only by: Marland Kitchen MyChart Message (if you have MyChart) OR . A paper copy in the mail If you have any lab test that is abnormal or we need to change your treatment, we will call you to review the results.   Testing/Procedures: Your physician has requested that you have an echocardiogram. Echocardiography is a painless test that uses sound waves to create images of your heart. It provides your doctor with information about the size and shape of your heart and how well your heart's chambers and valves are working. You may receive an ultrasound enhancing agent through an IV if needed to better visualize your heart during the echo.This procedure takes approximately one hour. There are no restrictions for this procedure. This will take place at the [address].     Follow-Up: At [facility], you and your health needs are our priority.  As part of our continuing mission to provide you with exceptional heart care, we have created designated Provider Care Teams.  These Care Teams include your primary Cardiologist (physician) and Advanced Practice Providers (APPs -  Physician Assistants and Nurse Practitioners) who all work together to provide you with the care you need, when you need it.  We recommend signing up for the patient portal called "MyChart".  Sign up information is provided on this After Visit Summary.  MyChart is used to connect with patients for Virtual Visits (Telemedicine).  Patients are able to view lab/test results, encounter notes, upcoming appointments, etc.  Non-urgent messages can be sent to your provider as well.   To learn more about what you can do with MyChart, go to [email or web address].    Your next  appointment:   Follow up with Dr. Sallyanne Kuster next week      Signed, [name], MD  08/23/2020 12:12 PM    [place]

## 2020-08-20 ENCOUNTER — Other Ambulatory Visit: Payer: Self-pay

## 2020-08-20 ENCOUNTER — Ambulatory Visit (HOSPITAL_COMMUNITY): Payer: Medicare HMO | Attending: Cardiovascular Disease

## 2020-08-20 DIAGNOSIS — I480 Paroxysmal atrial fibrillation: Secondary | ICD-10-CM | POA: Insufficient documentation

## 2020-08-20 LAB — ECHOCARDIOGRAM COMPLETE: S' Lateral: 3.7 cm

## 2020-08-23 ENCOUNTER — Encounter: Payer: Self-pay | Admitting: Cardiovascular Disease

## 2020-08-24 ENCOUNTER — Other Ambulatory Visit: Payer: Self-pay | Admitting: *Deleted

## 2020-08-24 ENCOUNTER — Inpatient Hospital Stay (HOSPITAL_BASED_OUTPATIENT_CLINIC_OR_DEPARTMENT_OTHER): Payer: Medicare HMO | Admitting: Hematology

## 2020-08-24 ENCOUNTER — Inpatient Hospital Stay: Payer: Medicare HMO | Admitting: Hematology

## 2020-08-24 ENCOUNTER — Inpatient Hospital Stay: Payer: Medicare HMO

## 2020-08-24 ENCOUNTER — Other Ambulatory Visit: Payer: Self-pay

## 2020-08-24 DIAGNOSIS — D649 Anemia, unspecified: Secondary | ICD-10-CM

## 2020-08-24 DIAGNOSIS — C91Z0 Other lymphoid leukemia not having achieved remission: Secondary | ICD-10-CM | POA: Diagnosis not present

## 2020-08-24 DIAGNOSIS — D472 Monoclonal gammopathy: Secondary | ICD-10-CM | POA: Diagnosis not present

## 2020-08-24 DIAGNOSIS — Z794 Long term (current) use of insulin: Secondary | ICD-10-CM | POA: Diagnosis not present

## 2020-08-24 DIAGNOSIS — K529 Noninfective gastroenteritis and colitis, unspecified: Secondary | ICD-10-CM | POA: Diagnosis not present

## 2020-08-24 DIAGNOSIS — Z7901 Long term (current) use of anticoagulants: Secondary | ICD-10-CM | POA: Diagnosis not present

## 2020-08-24 DIAGNOSIS — I4891 Unspecified atrial fibrillation: Secondary | ICD-10-CM | POA: Diagnosis not present

## 2020-08-24 DIAGNOSIS — Z87891 Personal history of nicotine dependence: Secondary | ICD-10-CM | POA: Diagnosis not present

## 2020-08-24 DIAGNOSIS — E118 Type 2 diabetes mellitus with unspecified complications: Secondary | ICD-10-CM | POA: Diagnosis not present

## 2020-08-24 DIAGNOSIS — N400 Enlarged prostate without lower urinary tract symptoms: Secondary | ICD-10-CM | POA: Diagnosis not present

## 2020-08-24 LAB — CBC WITH DIFFERENTIAL (CANCER CENTER ONLY)
Abs Immature Granulocytes: 0.15 10*3/uL — ABNORMAL HIGH (ref 0.00–0.07)
Basophils Absolute: 0 10*3/uL (ref 0.0–0.1)
Basophils Relative: 0 %
Eosinophils Absolute: 0.2 10*3/uL (ref 0.0–0.5)
Eosinophils Relative: 2 %
HCT: 22.4 % — ABNORMAL LOW (ref 39.0–52.0)
Hemoglobin: 7.3 g/dL — ABNORMAL LOW (ref 13.0–17.0)
Immature Granulocytes: 1 %
Lymphocytes Relative: 38 %
Lymphs Abs: 4.5 10*3/uL — ABNORMAL HIGH (ref 0.7–4.0)
MCH: 28 pg (ref 26.0–34.0)
MCHC: 32.6 g/dL (ref 30.0–36.0)
MCV: 85.8 fL (ref 80.0–100.0)
Monocytes Absolute: 1.4 10*3/uL — ABNORMAL HIGH (ref 0.1–1.0)
Monocytes Relative: 12 %
Neutro Abs: 5.5 10*3/uL (ref 1.7–7.7)
Neutrophils Relative %: 47 %
Platelet Count: 284 10*3/uL (ref 150–400)
RBC: 2.61 MIL/uL — ABNORMAL LOW (ref 4.22–5.81)
RDW: 16.7 % — ABNORMAL HIGH (ref 11.5–15.5)
WBC Count: 11.7 10*3/uL — ABNORMAL HIGH (ref 4.0–10.5)
nRBC: 0 % (ref 0.0–0.2)

## 2020-08-24 LAB — SAMPLE TO BLOOD BANK

## 2020-08-24 LAB — PREPARE RBC (CROSSMATCH)

## 2020-08-24 MED ORDER — SODIUM CHLORIDE 0.9% IV SOLUTION
250.0000 mL | Freq: Once | INTRAVENOUS | Status: AC
  Administered 2020-08-24: 250 mL via INTRAVENOUS
  Filled 2020-08-24: qty 250

## 2020-08-24 MED ORDER — ACETAMINOPHEN 325 MG PO TABS
ORAL_TABLET | ORAL | Status: AC
  Filled 2020-08-24: qty 2

## 2020-08-24 MED ORDER — METHYLPREDNISOLONE SODIUM SUCC 125 MG IJ SOLR
INTRAMUSCULAR | Status: AC
  Filled 2020-08-24: qty 2

## 2020-08-24 MED ORDER — METHYLPREDNISOLONE SODIUM SUCC 125 MG IJ SOLR
60.0000 mg | Freq: Once | INTRAMUSCULAR | Status: AC
  Administered 2020-08-24: 60 mg via INTRAVENOUS

## 2020-08-24 MED ORDER — ACETAMINOPHEN 325 MG PO TABS
650.0000 mg | ORAL_TABLET | Freq: Once | ORAL | Status: AC
  Administered 2020-08-24: 650 mg via ORAL

## 2020-08-24 NOTE — Progress Notes (Signed)
Verbal order/ Dr.Kale - Transfuse 1 unit PRBCs today (hgb 7.3)  Premeds: Tylenol 650 mg PO; Solumedrol 60 mg Confirmed orders with [facility]. They will call Infusion when blood is ready

## 2020-08-24 NOTE — Progress Notes (Signed)
HEMATOLOGY/ONCOLOGY CLINIC NOTE  Date of Service: 08/24/2020  Patient Care Team: [facility] [place] as PCP - General (Rheumatology) Collier Bullock, MD as Referring Physician (Hematology and Oncology) [name], DO as Referring Physician (Family Medicine)  CHIEF COMPLAINTS/PURPOSE OF CONSULTATION:  Large Granular Lymphocytic Leukemia  Hem/Onc History  1)- chronic leukocytosis (neutrophilia and absolute lymphocytosis), peripheral blood lymphocytes about 5000, about 1/4 of cells have LGL morphology 2)- macrocytic anemia at least since 09/08/2013 (normal CBC on 11/28/2012), MCV now as high as 115, low retics, marrow M:E ratio 15:1 in 05/2016, erythroid hypoplasia 3)- MGUS serum IgG lambda 0.4 g/dL in 07/2014, follow annually 4)- 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 weekly -03/29/2017 increase dose 25 mg po qweek -07/19/2017 increase mtx 30 mg po qweek, stable dose since then   HISTORY OF PRESENTING ILLNESS:  Tommy Peterson is a wonderful 80 y.o. male who has been referred to [place] by Dr [name] for evaluation and management of Large Granular Lymphocytic Leukemia. The pt reports that he is doing well overall.  Pt is here as he is moving his care because his previous Oncologist, Dr. [name], has changed practices. The pt reports that he needed 2 pints of blood every 2 weeks for a year beginning in 2014. Pt has never needed to any erythropoeitin injections. His Hgb has been holding higher  lately, around 10-11. It has been about 7 months since pt has needed his last blood transfusion. Pt has been taking 30 mg of Methotrexate once per week and denies any issues with that dosage. He has continued taking 1 mg Folic Acid once per day. He is also taking a daily multivitamin.   Pt was previously walking a lot but had to slow down due to fatigue. Pt will need a surgery to repair his left inguinal hernia.   In 2017 pt had a C. Diff infection. Pt had recurrent UTI infections in 2019. His last hospitalization for infections was in January 2020. There were no kidney stones or other reasons for repeat infections. Pt denies any pneumonia infections. Pt has had Diabetes for 40+ years and it is currently well controlled. Pt was never given a direct reason for chronic diarrhea but was placed on Budesonide which stopped his symptoms. Pt is currently taking 3 mg twice per week. He notes that he has been experiencing some urinary frequency, but does feel like he is completely emptying his bladder when he uses the restroom. Pt has seen a Urologist who gave him some medication that caused him to urinate up to 30 times per day. He has tried Flomax previously, and still has some at home, but notes that it does not help much. Pt is following with a Dermatologist , Dr. Rhona Raider, for his skin Squamous Cell Carcinoma. He is currently using Efudex to treat. He was having some night sweats about a year ago, but has had none recently.   Most recent lab results (08/28/2019) of CBC w/diff and CMP is as follows: all values are WNL except for Hgb at  11.8, HCT at 35.1, MCV at 122, MCH at 41.1, RDW at 2.87, RDW at 16.0, Abs Immature Granulocyte at 0.11K, Immature Granulocyte Rel at 1.2, Mono Abs at 1.0K, Creatinine at 0.5, Glucose at 199.  On review of systems, pt reports constipation, fatigue and denies diarrhea, unexpected weight loss, fevers, chills, night sweats, abdominal pain, leg swelling and any other symptoms.    On PMHx the pt reports BPH, Chronic Diarrhea, Chronic Large Granular Lymphocytic Leukemia, Collagenous Collitis, Clostridium defficile colitis, Chronic UTIs, Transfusion-dependant anemia, Type 2 diabetes mellitus.  INTERVAL HISTORY:  Tommy Peterson is a wonderful 80 y.o. male who is here for evaluation and management of Large Granular Lymphocytic Leukemia. The patient's last visit with [place] was on 07/28/2020. The pt reports that he is doing well overall.  The pt reports that he has been diagnosed with Afib and put on a blood thinner. He notes this has helped. His girlfriend noted that she felt his heart beating fast, but the pt did not notice this.  The pt was put on Eliquis and Metopolol by Dr. Loletha Grayer. This helped him.   The pt notes that he still has diarrhea. This is intermittent and he experiences some days without diarrhea, and some days with multiple times.  The pt was scheduled for a surgery for penile prothesis replacement, but this was postponed.   Lab results today 08/24/2020 of CBC w/diff is as follows: all values are WNL except for WBC at 11.7L, RBC at 2.61, Hgb at 7.3, HCT at 22.4, RDW at 16.7, Lymphs Abs at 4.5, Monocytes Abs at 1.4, Abs Immature Granulocytes at 0.15.   On review of systems, pt reports diarrhea and weakness and denies bleeding, n/v, leg swelling, and any other symptoms.  SURGICAL HISTORY: Past Surgical History:  Procedure Laterality Date  . BONE MARROW BIOPSY Right 05-24-2016;  08-20-2014  . CARDIOVASCULAR STRESS TEST  11-26-2012   [name]  @ [facility] [place]   normal nuclear study w/ no ischemia/  normal LV function and wall motion , ef 60% (find in care everywhere,epic)  . CATARACT EXTRACTION W/ INTRAOCULAR LENS  IMPLANT, BILATERAL  2015  . CYSTOSCOPY WITH LITHOLAPAXY N/A 12/25/2017   Procedure: CYSTOSCOPY WITH LITHOLAPAXY AND FULGERATION;  Surgeon: [name], MD;  Location: [facility];  Service: Urology;  Laterality: N/A;  .  INGUINAL HERNIA REPAIR Right 1980s  . LOOP RECORDER INSERTION N/A 04/10/2018   Procedure: LOOP RECORDER INSERTION;  Surgeon: [name], MD;  Location: [facility];  Service: Cardiovascular;  Laterality: N/A;  . PENILE PROSTHESIS IMPLANT  12-02-2015   dr [name] @ [facility] in [place]  . SHOULDER SURGERY Left 08/2016  . TONSILLECTOMY  child  . TRANSURETHRAL RESECTION OF PROSTATE  2009   AND REPAIR RECURRENT RIGHT INGUINAL HERNIA    SOCIAL HISTORY: Social History   Socioeconomic History  . Marital status: Widowed    Spouse name: engaged  . Number of children: 8  . Years of education: college-2  . Highest education level: Not on file  Occupational History  . Occupation: Press photographer  . 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 Smoking status: Former Smoker    Years: 20.00    Types: Cigarettes    Quit date: 05/04/1996    Years since quitting: 24.3  . Smokeless tobacco: Never Used  Vaping Use  . Vaping Use: Never used  Substance and Sexual Activity  . Alcohol use: Yes  Comment: 1-2 glasses of wine every other day  . Drug use: No  . Sexual activity: Never  Other Topics Concern  . Not on file  Social History Narrative  . Not on file   Social Determinants of Health   Financial Resource Strain: Not on file  Food Insecurity: Not on file  Transportation Needs: Not on file  Physical Activity: Not on file  Stress: Not on file  Social Connections: Not on file  Intimate Partner Violence: Not on file    FAMILY HISTORY: Family History  Problem Relation Age of Onset  . Diabetes Mother   . Heart failure Mother     ALLERGIES:  is allergic to bee venom, metformin and related, and sulfamethoxazole-trimethoprim.  MEDICATIONS:  Current Outpatient Medications  Medication Sig Dispense Refill  . acetaminophen (TYLENOL) 325 MG tablet Take 2 tablets (650 mg total) by mouth every 6 (six) hours as needed for mild pain (or Fever >/= 101).    Marland Kitchen albuterol  (VENTOLIN HFA) 108 (90 Base) MCG/ACT inhaler Inhale 2 puffs into the lungs every 6 (six) hours. 8 g 1  . apixaban (ELIQUIS) 5 MG TABS tablet Take 1 tablet (5 mg total) by mouth 2 (two) times daily. 60 tablet 11  . b complex vitamins capsule Take 1 capsule by mouth daily.    . budesonide (ENTOCORT EC) 3 MG 24 hr capsule Take 9 mg by mouth daily.    . Cranberry 400 MG TABS Take 1 tablet by mouth daily.     . D-Mannose 500 MG CAPS Take 500 mg by mouth daily.    . folic acid (FOLVITE) 1 MG tablet TAKE 1 TABLET BY MOUTH EVERY DAY 90 tablet 3  . guaiFENesin-dextromethorphan (ROBITUSSIN DM) 100-10 MG/5ML syrup Take 10 mLs by mouth every 4 (four) hours as needed for cough. 118 mL 0  . Insulin Glargine-Lixisenatide 100-33 UNT-MCG/ML SOPN Inject 26 Units into the skin at bedtime.    . methotrexate (RHEUMATREX) 15 MG tablet Take 15 mg by mouth once a week. Caution: Chemotherapy. Protect from light.    . metoprolol tartrate (LOPRESSOR) 50 MG tablet Take 1 tablet (50 mg total) by mouth 2 (two) times daily. 60 tablet 11  . NOVOLOG FLEXPEN 100 UNIT/ML FlexPen Inject 5 Units into the skin in the morning and at bedtime.    . OXYBUTYNIN CHLORIDE PO Take 1 tablet by mouth daily.    . pioglitazone (ACTOS) 15 MG tablet Take 15 mg by mouth every morning.     . polyethylene glycol (MIRALAX / GLYCOLAX) 17 g packet Take 17 g by mouth 2 (two) times daily. 14 each 0  . vitamin B-12 (CYANOCOBALAMIN) 100 MCG tablet Take 100 mcg by mouth daily.     No current facility-administered medications for this visit.    REVIEW OF SYSTEMS:   10 Point review of Systems was done is negative except as noted above.  PHYSICAL EXAMINATION: ECOG PERFORMANCE STATUS: 1 - Symptomatic but completely ambulatory  Exam was given in a chair  GENERAL:alert, in no acute distress and comfortable SKIN: no acute rashes, no significant lesions EYES: conjunctiva are pink and non-injected, sclera anicteric OROPHARYNX: MMM, no exudates, no  oropharyngeal erythema or ulceration NECK: supple, no JVD LYMPH:  no palpable lymphadenopathy in the cervical, axillary or inguinal regions LUNGS: clear to auscultation b/l with normal respiratory effort HEART: regular rate & rhythm ABDOMEN:  normoactive bowel sounds , non tender, not distended. Extremity: no pedal edema PSYCH: alert & oriented x 3 with  fluent speech NEURO: no focal motor/sensory deficits   LABORATORY DATA:  I have reviewed the data as listed  . CBC Latest Ref Rng & Units 08/24/2020 08/11/2020 07/28/2020  WBC 4.0 - 10.5 K/uL 11.7(H) 11.7(H) 10.3  Hemoglobin 13.0 - 17.0 g/dL 7.3(L) 7.7(L) 7.1(L)  Hematocrit 39.0 - 52.0 % 22.4(L) 23.4(L) 21.7(L)  Platelets 150 - 400 K/uL 284 325 241    . CMP Latest Ref Rng & Units 04/17/2020 04/10/2020 04/09/2020  Glucose 70 - 99 mg/dL 324(H) 167(H) 149(H)  BUN 8 - 23 mg/dL $Remove'15 17 17  'Zpbzcqt$ Creatinine 0.61 - 1.24 mg/dL 0.52(L) 0.48(L) 0.44(L)  Sodium 135 - 145 mmol/L 135 138 139  Potassium 3.5 - 5.1 mmol/L 4.1 3.9 4.4  Chloride 98 - 111 mmol/L 102 105 107  CO2 22 - 32 mmol/L $RemoveB'24 26 24  'FQFnyXYh$ Calcium 8.9 - 10.3 mg/dL 8.1(L) 8.2(L) 8.2(L)  Total Protein 6.5 - 8.1 g/dL 6.1(L) 5.7(L) 5.6(L)  Total Bilirubin 0.3 - 1.2 mg/dL 1.4(H) 0.8 0.8  Alkaline Phos 38 - 126 U/L 58 50 47  AST 15 - 41 U/L 21 32 34  ALT 0 - 44 U/L 40 52(H) 43   09/28/2016 Flow Cytometry:   RADIOGRAPHIC STUDIES: I have personally reviewed the radiological images as listed and agreed with the findings in the report. ECHOCARDIOGRAM COMPLETE  Result Date: 08/20/2020    ECHOCARDIOGRAM REPORT   Patient Name:   Tommy Peterson Date of Exam: 08/20/2020 Medical Rec #:  [id number]         Height:       69.5 in Accession #:    [id number]        Weight:       167.0 lb Date of Birth:  31-Oct-1940          BSA:          1.923 m Patient Age:    58 years          BP:           128/70 mmHg Patient Gender: M                 HR:           84 bpm. Exam Location:  [place] Procedure: 2D Echo,  Cardiac Doppler and Color Doppler Indications:    I48.91* Unspecified atrial fibrillation  History:        Patient has prior history of Echocardiogram examinations, most                 recent 02/20/2018. Signs/Symptoms:Chest Pain; Risk                 Factors:Dyslipidemia and Diabetes. High blood pressure. Aortic                 atherosclerosis. Dyspnea on exertion. Anemia. Leukemia.  Sonographer:    Diamond Nickel RCS Referring Phys: [phone] [facility] CROIT[facility]U  Sonographer Comments: Technically difficult to position patient on left side. IMPRESSIONS  1. Left ventricular ejection fraction, by estimation, is 55 to 60%. The left ventricle has normal function. The left ventricle has no regional wall motion abnormalities. There is mild left ventricular hypertrophy. Left ventricular diastolic parameters are indeterminate.  2. Right ventricular systolic function is normal. The right ventricular size is normal. There is mildly elevated pulmonary artery systolic pressure.  3. Left atrial size was mildly dilated.  4. Pericardial effusion measuring up to 0.8 cm. a small pericardial effusion is present. There is no evidence of cardiac  tamponade.  5. The mitral valve is normal in structure. Trivial mitral valve regurgitation. No evidence of mitral stenosis.  6. The aortic valve is calcified. There is mild calcification of the aortic valve. There is mild thickening of the aortic valve. Aortic valve regurgitation is not visualized. No aortic stenosis is present.  7. The inferior vena cava is dilated in size with <50% respiratory variability, suggesting right atrial pressure of 15 mmHg. FINDINGS  Left Ventricle: Left ventricular ejection fraction, by estimation, is 55 to 60%. The left ventricle has normal function. The left ventricle has no regional wall motion abnormalities. The left ventricular internal cavity size was normal in size. There is  mild left ventricular hypertrophy. Left ventricular diastolic parameters are indeterminate.  Right Ventricle: The right ventricular size is normal. No increase in right ventricular wall thickness. Right ventricular systolic function is normal. There is mildly elevated pulmonary artery systolic pressure. The tricuspid regurgitant velocity is 2.64  m/s, and with an assumed right atrial pressure of 15 mmHg, the estimated right ventricular systolic pressure is 78.9 mmHg. Left Atrium: Left atrial size was mildly dilated. Right Atrium: Right atrial size was normal in size. Pericardium: Pericardial effusion measuring up to 0.8 cm. A small pericardial effusion is present. There is no evidence of cardiac tamponade. Mitral Valve: The mitral valve is normal in structure. There is mild thickening of the mitral valve leaflet(s). Trivial mitral valve regurgitation. No evidence of mitral valve stenosis. Tricuspid Valve: The tricuspid valve is normal in structure. Tricuspid valve regurgitation is trivial. No evidence of tricuspid stenosis. Aortic Valve: The aortic valve is calcified. There is mild calcification of the aortic valve. There is mild thickening of the aortic valve. Aortic valve regurgitation is not visualized. No aortic stenosis is present. Pulmonic Valve: The pulmonic valve was normal in structure. Pulmonic valve regurgitation is trivial. No evidence of pulmonic stenosis. Aorta: The aortic root is normal in size and structure. Venous: The inferior vena cava is dilated in size with less than 50% respiratory variability, suggesting right atrial pressure of 15 mmHg. IAS/Shunts: No atrial level shunt detected by color flow Doppler.  LEFT VENTRICLE PLAX 2D LVIDd:         5.30 cm LVIDs:         3.70 cm LV PW:         1.00 cm LV IVS:        1.10 cm LVOT diam:     2.00 cm LV SV:         47 LV SV Index:   25 LVOT Area:     3.14 cm  RIGHT VENTRICLE RV Basal diam:  3.20 cm RV S prime:     10.26 cm/s TAPSE (M-mode): 1.6 cm RVSP:           35.9 mmHg LEFT ATRIUM             Index       RIGHT ATRIUM           Index LA diam:         3.90 cm 2.03 cm/m  RA Pressure: 8.00 mmHg LA Vol (A2C):   64.8 ml 33.70 ml/m RA Area:     18.10 cm LA Vol (A4C):   55.8 ml 29.02 ml/m RA Volume:   50.00 ml  26.00 ml/m LA Biplane Vol: 62.3 ml 32.40 ml/m  AORTIC VALVE LVOT Vmax:   75.27 cm/s LVOT Vmean:  48.800 cm/s LVOT VTI:    0.151 m  AORTA Ao  Root diam: 3.30 cm TRICUSPID VALVE TR Peak grad:   27.9 mmHg TR Vmax:        264.00 cm/s Estimated RAP:  8.00 mmHg RVSP:           35.9 mmHg  SHUNTS Systemic VTI:  0.15 m Systemic Diam: 2.00 cm Skeet Latch MD Electronically signed by Skeet Latch MD Signature Date/Time: 08/20/2020/5:47:54 PM    Final     ASSESSMENT & PLAN:   80 yo with   1) T cell large granular leukemia s/p transfusion dependent anemia - previous blood transfusion dependent and was last on MTX $Remo'30mg'IklbT$  po weekly a few months ago 2) IgG Lambda MGUS 3) recent COVID-19 infection 4) microscopic colitis with chronic diarrhea. PLAN: -Discussed pt labwork today, 08/24/2020; all stable but Lymphs Abs have decreased. -Continue blood thinners for 4 weeks.  -No lab or clinical evidence of Large Granular Lymphocytic Leukemia progression at this time. Will continue watchful observation. -Recommend pt continue $RemoveBef'10mg'PCfxQoKSrq$  Methotrexate weekly -- will gradually increase dose if counts stable. -Continue 2 mg Folic acid, Vitamin B44, and B-complex daily -Continue 2 units of PRBC q2weeks with labs    FOLLOW UP: PLZ schedule for labs and 2 units of PRBC q2weeks x 6 MD visit in 4 weeks    The total time spent in the appointment was 20 minutes and more than 50% was on counseling and direct patient cares.   Sullivan Lone MD [place] AAHIVMS [facility] Hematology/Oncology Physician [facility]  (Office):       [phone] (Work cell):  [phone] (Fax):           [phone]  08/24/2020 8:09 AM   Note completed by Reinaldo Raddle as medical scribe.  .I have reviewed the above documentation for accuracy and completeness, and I  agree with the above. Brunetta Genera MD

## 2020-08-24 NOTE — Patient Instructions (Signed)

## 2020-08-25 LAB — TYPE AND SCREEN
ABO/RH(D): O POS
Antibody Screen: NEGATIVE
Unit division: 0

## 2020-08-25 LAB — BPAM RBC
Blood Product Expiration Date: 202202152359
ISSUE DATE / TIME: 202201181126
Unit Type and Rh: 5100

## 2020-08-26 ENCOUNTER — Other Ambulatory Visit: Payer: Self-pay

## 2020-08-26 ENCOUNTER — Ambulatory Visit (INDEPENDENT_AMBULATORY_CARE_PROVIDER_SITE_OTHER): Payer: Medicare HMO | Admitting: Cardiovascular Disease

## 2020-08-26 ENCOUNTER — Encounter: Payer: Self-pay | Admitting: Cardiovascular Disease

## 2020-08-26 VITALS — BP 128/76 | HR 126 | Ht 70.0 in | Wt 175.0 lb

## 2020-08-26 DIAGNOSIS — I700 Atherosclerosis of aorta: Secondary | ICD-10-CM

## 2020-08-26 DIAGNOSIS — E119 Type 2 diabetes mellitus without complications: Secondary | ICD-10-CM | POA: Diagnosis not present

## 2020-08-26 DIAGNOSIS — C91Z0 Other lymphoid leukemia not having achieved remission: Secondary | ICD-10-CM | POA: Diagnosis not present

## 2020-08-26 DIAGNOSIS — I4819 Other persistent atrial fibrillation: Secondary | ICD-10-CM | POA: Diagnosis not present

## 2020-08-26 DIAGNOSIS — Z4509 Encounter for adjustment and management of other cardiac device: Secondary | ICD-10-CM

## 2020-08-26 MED ORDER — METOPROLOL TARTRATE 75 MG PO TABS: 75.0000 mg | ORAL_TABLET | Freq: Two times a day (BID) | ORAL | 11 refills | Status: DC

## 2020-08-26 NOTE — Patient Instructions (Signed)
Medication Instructions:  INCREASE the Metoprolol Tartrate to 75 mg twice daily  *If you need a refill on your cardiac medications before your next appointment, please call your pharmacy*   Lab Work: Your provider would like for you to have the following labs today: CBC and BMET  If you have labs (blood work) drawn today and your tests are completely normal, you will receive your results only by: Marland Kitchen MyChart Message (if you have MyChart) OR . A paper copy in the mail If you have any lab test that is abnormal or we need to change your treatment, we will call you to review the results.  Follow-Up: At [facility], you and your health needs are our priority.  As part of our continuing mission to provide you with exceptional heart care, we have created designated Provider Care Teams.  These Care Teams include your primary Cardiologist (physician) and Advanced Practice Providers (APPs -  Physician Assistants and Nurse Practitioners) who all work together to provide you with the care you need, when you need it.  We recommend signing up for the patient portal called "MyChart".  Sign up information is provided on this After Visit Summary.  MyChart is used to connect with patients for Virtual Visits (Telemedicine).  Patients are able to view lab/test results, encounter notes, upcoming appointments, etc.  Non-urgent messages can be sent to your provider as well.   To learn more about what you can do with MyChart, go to [email or web address].    Your next appointment:   1 month(s)  The format for your next appointment:   In Person  Provider:   You may see [name], MD or one of the following Advanced Practice Providers on your designated Care Team:    Almyra Deforest, PA-C  Fabian Sharp, [place] or   Roby Lofts, [place]    Other Instructions  You are scheduled for a TEE/ Cardioversion on 08/31/20 with Dr. Sallyanne Kuster.  Please arrive at the [facility] (Main Entrance A) at [facility]:  [address] [place], [place] [address] at 11 am. (1 hour prior to procedure)  DIET: Nothing to eat or drink after midnight except a sip of water with medications (see medication instructions below)  Medication Instructions: Hold your diabetic medication the morning of the procedure  Continue your anticoagulant: Eliquis. If you miss a dose, please call [phone].  You will need to continue your anticoagulant after your procedure until you  are told by your provider that it is safe to stop   Labs:  You will need to have the coronavirus test completed prior to your procedure. An appointment has been made at 2:35 pm on 08/27/20. This is a Drive Up Visit at [address], [place], [place] [address]. Please tell them that you are there for procedure testing. Stay in your car and someone will be with you shortly. Please make sure to have all other labs completed before this test because you will need to stay quarantined until your procedure.   You must have a responsible person to drive you home and stay in the waiting area during your procedure. Failure to do so could result in cancellation.  Bring your insurance cards.  *Special Note: Every effort is made to have your procedure done on time. Occasionally there are emergencies that occur at the hospital that may cause delays. Please be patient if a delay does occur.

## 2020-08-26 NOTE — H&P (View-Only) (Signed)
Cardiology Office Note:    Date:  08/26/2020   ID:  Tommy Peterson, DOB 1941/01/30, MRN [id number]  PCP:  [facility]  Cardiologist:  Nickoles Gregori  Chief Complaint  Patient presents with  . Edema    Ankles.  . Follow-up    1 week for afib.     History of Present Illness:    Tommy Peterson is a 80 y.o. 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  A loop recorder was implanted in 2019 and for couple of years did not show any rhythm irregularities, other than paroxysmal atrial tachycardia.  At the patient's request, we stopped performing regular downloads to avoid billing.  He is very reluctant in the past to undergo anticoagulation due to problems with anemia, related to his leukemia and its treatment.  He presented a few days ago with minimally symptomatic atrial fibrillation with rapid ventricular response.  Eliquis and metoprolol 50 mg twice daily was started.  He presents today for follow-up and is ventricular rate has decreased from 143 to 126.  He has mild shortness of breath on exertion, but has not had dizziness, syncope, angina or severe dyspnea.  He remains unaware of palpitations.  He has not had any bleeding issues.  His level of energy improved for a couple of days after receiving a unit of packed red blood cells in the oncology clinic, but he feels tired again today.  The current episode of atrial fibrillation has been uninterrupted since January 8.  At worst, his CHA2DS2-VASc Score =4(age 4, HTN -questionable dx, DM), 4.8% stroke risk per year.Today's blood pressure is normal without any medications and he probably has a CHA2DS2-VASc score of 3.  HASBLED 1.   He does not  have a history of coronary artery disease or peripheral arterial disease.  In 2017 he was admitted with an episode of chest pain and his work-up for ischemia was negative.  Nevertheless, there was evidence of aortic and coronary calcifications on a CT of the chest performed in January 2018.  Past Medical History:  Diagnosis Date  . Acute respiratory failure ([place])   . BCC (basal cell carcinoma of skin) 08/15/2016   w/SCC Left Forehead (treatment @ [facility])  . BPH (benign prostatic hyperplasia)   . Carpal tunnel syndrome on both sides   . Chronic diarrhea    intermittant due to chronic collagenous colitis  . Chronic large granular lymphocytic leukemia ([place]) primary hemotologist-  [name]. 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 Collagenous colitis    chronic--- intermittant between diarrahea and constipation  . ED (erectile dysfunction)   . HCAP (healthcare-associated pneumonia)   . History of adenomatous polyp of colon   . History of Clostridium difficile colitis 05/2016  . History of pneumonia 11/08/2017   CAP -- LLL---  12-19-2017  per pt no residual symptoms  . History of SCC (squamous cell carcinoma) of skin   . History of sepsis    11-20-2017 severe sepsis due to UTI;  10/ 2017sepsis due to c-diff colitis  . Leucocytosis    chronic  .  Lower urinary tract symptoms (LUTS)   . Macrocytic anemia    since 02/ 2015  . MGUS (monoclonal gammopathy of unknown significance)    hemotologist-  dr Michelene Heady @ [facility]  . Monoclonal paraproteinemia   . Neuropathy, peripheral   . Nodular basal cell carcinoma (BCC) 10/01/2018   Left Neck(Nodular) Pt Does Not Want Treatment  . Nodular basal cell carcinoma (BCC) 10/01/2018   Mid Forehead (treament curet and excision)  . OA  (osteoarthritis)   . Prostatic stone   . Rash of face    RIGHT SIDE  . Raynaud's syndrome   . Recurrent BCC (basal cell carcinoma) 03/05/2019   positive margin  . SCC (squamous cell carcinoma) 08/10/2009   Central Forehead (Moh's Dr. Jeanie Sewer @ [facility])  . SCC (squamous cell carcinoma) 12/08/2011   CIS-Left Cheek (treatment Aldara @ [facility])  . SCC (squamous cell carcinoma) 07/09/2015   CIS-Left Forehead (treament @ [facility])  . Septic shock (HCC)    x 3 w/UTI  . Squamous cell carcinoma in situ (SCCIS) 12/08/2011   Left Cheek (treatment Aldara @ [facility])   . Superficial basal cell carcinoma (BCC) 10/01/2018   Left Shin-Pt does not want treatment  . Thin skin    fragile  . Transfusion-dependent anemia since 10/ 2017   last transfusion PRBCs 07-19-2017  per hemologist note dated 10-25-2017  . Type 2 diabetes mellitus treated with insulin (HCC)    followed by dr Selena Batten (pcp)  . Urinary hesitancy   . Wears contact lenses    LEFT EYE ONLY    Past Surgical History:  Procedure Laterality Date  . BONE MARROW BIOPSY Right 05-24-2016;  08-20-2014  . CARDIOVASCULAR STRESS TEST  11-26-2012   dr Boneta Lucks  @ [facility] ([place])   normal nuclear study w/ no ischemia/  normal LV function and wall motion , ef 60% (find in care everywhere,epic)  . CATARACT EXTRACTION W/ INTRAOCULAR LENS  IMPLANT, BILATERAL  2015  . CYSTOSCOPY WITH LITHOLAPAXY N/A 12/25/2017   Procedure: CYSTOSCOPY WITH LITHOLAPAXY AND FULGERATION;  Surgeon: Bjorn Pippin, MD;  Location: [facility];  Service: Urology;  Laterality: N/A;  . INGUINAL HERNIA REPAIR Right 1980s  . LOOP RECORDER INSERTION N/A 04/10/2018   Procedure: LOOP RECORDER INSERTION;  Surgeon: Thurmon Fair, MD;  Location: [facility] INVASIVE CV LAB;  Service: Cardiovascular;  Laterality: N/A;  . PENILE PROSTHESIS IMPLANT  12-02-2015   dr Terie Purser @ [facility] in [place]   COLOPLAST  . SHOULDER SURGERY Left 08/2016  . TONSILLECTOMY   child  . TRANSURETHRAL RESECTION OF PROSTATE  2009   AND REPAIR RECURRENT RIGHT INGUINAL HERNIA    Current Medications: Current Meds  Medication Sig  . acetaminophen (TYLENOL) 325 MG tablet Take 2 tablets (650 mg total) by mouth every 6 (six) hours as needed for mild pain (or Fever >/= 101).  Marland Kitchen albuterol (VENTOLIN HFA) 108 (90 Base) MCG/ACT inhaler Inhale 2 puffs into the lungs every 6 (six) hours. (Patient taking differently: Inhale 2 puffs into the lungs every 6 (six) hours as needed for wheezing or shortness of breath.)  . apixaban (ELIQUIS) 5 MG TABS tablet Take 1 tablet (5 mg total) by mouth 2 (two) times daily.  Marland Kitchen b complex vitamins capsule Take 1 capsule by mouth every evening.  . budesonide (ENTOCORT EC) 3 MG 24 hr capsule Take 9 mg by mouth daily.  . Cranberry 400 MG TABS Take 400 mg by mouth daily.  . D-Mannose 500  MG CAPS Take 500 mg by mouth in the morning and at bedtime.  . folic acid (FOLVITE) 1 MG tablet TAKE 1 TABLET BY MOUTH EVERY DAY (Patient taking differently: Take 1 mg by mouth every evening.)  . guaiFENesin-dextromethorphan (ROBITUSSIN DM) 100-10 MG/5ML syrup Take 10 mLs by mouth every 4 (four) hours as needed for cough. (Patient not taking: Reported on 08/26/2020)  . Insulin Glargine-Lixisenatide 100-33 UNT-MCG/ML SOPN Inject 26 Units into the skin at bedtime. Soliqua 100/33  . methotrexate (RHEUMATREX) 15 MG tablet Take 10 mg by mouth every Tuesday. Caution: Chemotherapy. Protect from light.  Marland Kitchen NOVOLOG FLEXPEN 100 UNIT/ML FlexPen Inject 5 Units into the skin every evening.  . pioglitazone (ACTOS) 15 MG tablet Take 15 mg by mouth every morning.   . [DISCONTINUED] metoprolol tartrate (LOPRESSOR) 50 MG tablet Take 1 tablet (50 mg total) by mouth 2 (two) times daily.  . [DISCONTINUED] OXYBUTYNIN CHLORIDE PO Take 1 tablet by mouth daily.  . [DISCONTINUED] vitamin B-12 (CYANOCOBALAMIN) 100 MCG tablet Take 100 mcg by mouth daily.     Allergies:   Bee venom, Metformin and  related, and Sulfamethoxazole-trimethoprim   Social History   Socioeconomic History  . Marital status: Widowed    Spouse name: engaged  . Number of children: 8  . Years of education: college-2  . Highest education level: Not on file  Occupational History  . Occupation: Press photographer  . 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 Smoking status: Former Smoker    Years: 20.00    Types: Cigarettes    Quit date: 05/04/1996    Years since quitting: 24.3  . Smokeless tobacco: Never Used  Vaping Use  . Vaping Use: Never used  Substance and Sexual Activity  . Alcohol use: Yes    Comment: 1-2 glasses of wine every other day  . Drug use: No  . Sexual activity: Never  Other Topics Concern  . Not on file  Social History Narrative  . Not on file   Social Determinants of Health   Financial Resource Strain: Not on file  Food Insecurity: Not on file  Transportation Needs: Not on file  Physical Activity: Not on file  Stress: Not on file  Social Connections: Not on file     Family History: The patient's family history includes Diabetes in his mother; Heart failure in his mother.  ROS:   Please see the history of present illness.   All other systems are reviewed and are negative.  EKGs/Labs/Other Studies Reviewed:    The following studies were reviewed today: Comprehensive download of his loop recorder in the office today. ECHO 02/20/2018  - Left ventricle: The cavity size was normal. Wall thickness was   increased in a pattern of moderate LVH. Systolic function was   normal. The estimated ejection fraction was in the range of 60%   to 65%. Wall motion was normal; there were no regional wall   motion abnormalities. - Aortic valve: Transvalvular velocity was within the normal range.   There was no stenosis. There was no regurgitation. - Mitral valve: Transvalvular velocity was within the normal range.   There was no evidence for stenosis. There was trivial   regurgitation. -  Left atrium: The atrium was moderately dilated. - Right ventricle: The cavity size was normal. Wall thickness was   normal. Systolic function was normal. - Right atrium: The atrium was moderately dilated. - Atrial septum: No defect or patent foramen ovale was identified   by color flow  Doppler. - Tricuspid valve: There was mild regurgitation. - Pulmonary arteries: Systolic pressure was mildly increased. PA   peak pressure: 44 mm Hg (S).   EKG:  EKG is ordered today.  It shows atrial fibrillation with rapid ventricular response at 136 bpm, unchanged QS pattern in leads V1-V2, borderline QTC 454 ms.  Recent Labs: 04/10/2020: Magnesium 2.3 04/17/2020: ALT 40; BUN 15; Creatinine, Ser 0.52; Potassium 4.1; Sodium 135 08/24/2020: Hemoglobin 7.3; Platelet Count 284  Recent Lipid Panel    Component Value Date/Time   CHOL 111 05/05/2016 0402   TRIG 111 04/07/2020 0055   HDL 27 (L) 05/05/2016 0402   CHOLHDL 4.1 05/05/2016 0402   VLDL 21 05/05/2016 0402   LDLCALC 63 05/05/2016 0402    Physical Exam:    VS:  BP 128/76 (BP Location: Left Arm, Patient Position: Sitting, Cuff Size: Normal)   Pulse (!) 126   Ht $R'5\' 10"'kf$  (1.778 m)   Wt 175 lb (79.4 kg)   BMI 25.11 kg/m     Wt Readings from Last 3 Encounters:  08/26/20 175 lb (79.4 kg)  08/18/20 167 lb (75.8 kg)  06/29/20 168 lb 12 oz (76.5 kg)       General: Alert, oriented x3, no distress, maybe slightly tachypneic (RR 20), pale Head: no evidence of trauma, PERRL, EOMI, no exophtalmos or lid lag, no myxedema, no xanthelasma; normal ears, nose and oropharynx Neck: normal jugular venous pulsations and no hepatojugular reflux; brisk carotid pulses without delay and no carotid bruits Chest: clear to auscultation, no signs of consolidation by percussion or palpation, normal fremitus, symmetrical and full respiratory excursions Cardiovascular: normal position and quality of the apical impulse, rapid irregular rhythm, normal first and second  heart sounds, no murmurs, rubs or gallops Abdomen: no tenderness or distention, no masses by palpation, no abnormal pulsatility or arterial bruits, normal bowel sounds, no hepatosplenomegaly Extremities: no clubbing, cyanosis or edema; 2+ radial, ulnar and brachial pulses bilaterally; 2+ right femoral, posterior tibial and dorsalis pedis pulses; 2+ left femoral, posterior tibial and dorsalis pedis pulses; no subclavian or femoral bruits Neurological: grossly nonfocal Psych: Normal mood and affect     ASSESSMENT:    1. Persistent atrial fibrillation ([place])   2. Encounter for loop recorder check   3. Large granular lymphocytic leukemia ([place])   4. Type 2 diabetes mellitus without complication, without long-term current use of insulin ([place])   5. Aortic atherosclerosis (HCC)    PLAN:    In order of problems listed above:  1. AFib: He is tolerating the atrial fibrillation relatively well, but remains in RVR and I am concerned that this in combination with his anemia may lead to decompensation.  I think he will need a cardioversion since he is not easy to rate control.  We discussed her options.  Rather than wait for 3 weeks of full anticoagulation we will plan for TEE cardioversion next week. This procedure has been fully reviewed with the patient and written informed consent has been obtained.  He should not interrupt the apixaban between now and then.  We will increase the metoprolol to 75 mg twice daily.  I am concerned about the risk of bradycardia postprocedure (in sinus rhythm, his heart rate was 50 prior to the onset of atrial fibrillation.  For the same reason would not want to start antiarrhythmic medications just yet. 2. ILR: Scheduled back for monthly device clinic downloads. 3. Leukemia/chronic anemia: He does not have iron deficiency.  Hemoglobin was 7.3 before receiving  1 unit of PRBC on January 15. 4. DM: Acceptable control, most recent hemoglobin A1c 7.5%, no history of coronary or  peripheral vascular disease.  Does have aortic atherosclerosis on imaging studies. 5. Aortic and coronary atherosclerosis: Noted on CT chest.  He has never had angina pectoris, but he has never had any functional studies for CAD either.  LDL cholesterol was 63 in 2017 and 70 in 2019, without medications.  He has normal left ventricular systolic function.  All his risk factors appear to be adequately addressed.  Shared Decision Making/Informed Consent The risks [stroke, cardiac arrhythmias rarely resulting in the need for a temporary or permanent pacemaker, skin irritation or burns, esophageal damage, perforation (1:10,000 risk), bleeding, pharyngeal hematoma as well as other potential complications associated with conscious sedation including aspiration, arrhythmia, respiratory failure and death], benefits (treatment guidance, restoration of normal sinus rhythm, diagnostic support) and alternatives of a transesophageal echocardiogram guided cardioversion were discussed in detail with Tommy Peterson and he is willing to proceed.      Medication Adjustments/Labs and Tests Ordered: Current medicines are reviewed at length with the patient today.  Concerns regarding medicines are outlined above.  Orders Placed This Encounter  Procedures  . Basic metabolic panel  . CBC  . EKG 12-Lead   Meds ordered this encounter  Medications  . metoprolol tartrate 75 MG TABS    Sig: Take 75 mg by mouth 2 (two) times daily.    Dispense:  60 tablet    Refill:  11    No refill needed. Please note dose change    Patient Instructions  Medication Instructions:  INCREASE the Metoprolol Tartrate to 75 mg twice daily  *If you need a refill on your cardiac medications before your next appointment, please call your pharmacy*   Lab Work: Your provider would like for you to have the following labs today: CBC and BMET  If you have labs (blood work) drawn today and your tests are completely normal, you will receive  your results only by: Marland Kitchen MyChart Message (if you have MyChart) OR . A paper copy in the mail If you have any lab test that is abnormal or we need to change your treatment, we will call you to review the results.  Follow-Up: At [facility], you and your health needs are our priority.  As part of our continuing mission to provide you with exceptional heart care, we have created designated Provider Care Teams.  These Care Teams include your primary Cardiologist (physician) and Advanced Practice Providers (APPs -  Physician Assistants and Nurse Practitioners) who all work together to provide you with the care you need, when you need it.  We recommend signing up for the patient portal called "MyChart".  Sign up information is provided on this After Visit Summary.  MyChart is used to connect with patients for Virtual Visits (Telemedicine).  Patients are able to view lab/test results, encounter notes, upcoming appointments, etc.  Non-urgent messages can be sent to your provider as well.   To learn more about what you can do with MyChart, go to [email or web address].    Your next appointment:   1 month(s)  The format for your next appointment:   In Person  Provider:   You may see [name], MD or one of the following Advanced Practice Providers on your designated Care Team:    Almyra Deforest, PA-C  Fabian Sharp, [place] or   Roby Lofts, [place]    Other Instructions  You are scheduled for  a TEE/ Cardioversion on 08/31/20 with Dr. Sallyanne Kuster.  Please arrive at the [facility] (Main Entrance A) at [facility]: [address] [place], [place] [address] at 11 am. (1 hour prior to procedure)  DIET: Nothing to eat or drink after midnight except a sip of water with medications (see medication instructions below)  Medication Instructions: Hold your diabetic medication the morning of the procedure  Continue your anticoagulant: Eliquis. If you miss a dose, please call [phone].  You  will need to continue your anticoagulant after your procedure until you  are told by your provider that it is safe to stop   Labs:  You will need to have the coronavirus test completed prior to your procedure. An appointment has been made at 2:35 pm on 08/27/20. This is a Drive Up Visit at [address], [place], [place] [address]. Please tell them that you are there for procedure testing. Stay in your car and someone will be with you shortly. Please make sure to have all other labs completed before this test because you will need to stay quarantined until your procedure.   You must have a responsible person to drive you home and stay in the waiting area during your procedure. Failure to do so could result in cancellation.  Bring your insurance cards.  *Special Note: Every effort is made to have your procedure done on time. Occasionally there are emergencies that occur at the hospital that may cause delays. Please be patient if a delay does occur.       Signed, [name], MD  08/26/2020 5:16 PM    [place]

## 2020-08-26 NOTE — Progress Notes (Signed)
Cardiology Office Note:    Date:  08/26/2020   ID:  Tommy Peterson, DOB 05-Jul-1941, MRN [id number]  PCP:  [facility]  Cardiologist:  Zyiere Rosemond  Chief Complaint  Patient presents with  . Edema    Ankles.  . Follow-up    1 week for afib.     History of Present Illness:    TEVION LAFORGE is a 80 y.o. 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  A loop recorder was implanted in 2019 and for couple of years did not show any rhythm irregularities, other than paroxysmal atrial tachycardia.  At the patient's request, we stopped performing regular downloads to avoid billing.  He is very reluctant in the past to undergo anticoagulation due to problems with anemia, related to his leukemia and its treatment.  He presented a few days ago with minimally symptomatic atrial fibrillation with rapid ventricular response.  Eliquis and metoprolol 50 mg twice daily was started.  He presents today for follow-up and is ventricular rate has decreased from 143 to 126.  He has mild shortness of breath on exertion, but has not had dizziness, syncope, angina or severe dyspnea.  He remains unaware of palpitations.  He has not had any bleeding issues.  His level of energy improved for a couple of days after receiving a unit of packed red blood cells in the oncology clinic, but he feels tired again today.  The current episode of atrial fibrillation has been uninterrupted since January 8.  At worst, his CHA2DS2-VASc Score =4(age 11, HTN -questionable dx, DM), 4.8% stroke risk per year.Today's blood pressure is normal without any medications and he probably has a CHA2DS2-VASc score of 3.  HASBLED 1.   He does not  have a history of coronary artery disease or peripheral arterial disease.  In 2017 he was admitted with an episode of chest pain and his work-up for ischemia was negative.  Nevertheless, there was evidence of aortic and coronary calcifications on a CT of the chest performed in January 2018.  Past Medical History:  Diagnosis Date  . Acute respiratory failure ([place])   . BCC (basal cell carcinoma of skin) 08/15/2016   w/SCC Left Forehead (treatment @ [facility])  . BPH (benign prostatic hyperplasia)   . Carpal tunnel syndrome on both sides   . Chronic diarrhea    intermittant due to chronic collagenous colitis  . Chronic large granular lymphocytic leukemia ([place]) primary hemotologist-  [name]. 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 Collagenous colitis    chronic--- intermittant between diarrahea and constipation  . ED (erectile dysfunction)   . HCAP (healthcare-associated pneumonia)   . History of adenomatous polyp of colon   . History of Clostridium difficile colitis 05/2016  . History of pneumonia 11/08/2017   CAP -- LLL---  12-19-2017  per pt no residual symptoms  . History of SCC (squamous cell carcinoma) of skin   . History of sepsis    11-20-2017 severe sepsis due to UTI;  10/ 2017sepsis due to c-diff colitis  . Leucocytosis    chronic  .  Lower urinary tract symptoms (LUTS)   . Macrocytic anemia    since 02/ 2015  . MGUS (monoclonal gammopathy of unknown significance)    hemotologist-  [name] @ [facility]  . Monoclonal paraproteinemia   . Neuropathy, peripheral   . Nodular basal cell carcinoma (BCC) 10/01/2018   Left Neck(Nodular) Pt Does Not Want Treatment  . Nodular basal cell carcinoma (BCC) 10/01/2018   Mid Forehead (treament curet and excision)  . OA  (osteoarthritis)   . Prostatic stone   . Rash of face    RIGHT SIDE  . Raynaud's syndrome   . Recurrent BCC (basal cell carcinoma) 03/05/2019   positive margin  . SCC (squamous cell carcinoma) 08/10/2009   Central Forehead (Moh's Dr. Rhona Raider @ [facility])  . SCC (squamous cell carcinoma) 12/08/2011   CIS-Left Cheek (treatment Aldara @ [facility])  . SCC (squamous cell carcinoma) 07/09/2015   CIS-Left Forehead (treament @ [facility])  . Septic shock (HCC)    x 3 w/UTI  . Squamous cell carcinoma in situ (SCCIS) 12/08/2011   Left Cheek (treatment Aldara @ [facility])   . Superficial basal cell carcinoma (BCC) 10/01/2018   Left Shin-Pt does not want treatment  . Thin skin    fragile  . Transfusion-dependent anemia since 10/ 2017   last transfusion PRBCs 07-19-2017  per hemologist note dated 10-25-2017  . Type 2 diabetes mellitus treated with insulin ([place])    followed by [name] (pcp)  . Urinary hesitancy   . Wears contact lenses    LEFT EYE ONLY    Past Surgical History:  Procedure Laterality Date  . BONE MARROW BIOPSY Right 05-24-2016;  08-20-2014  . CARDIOVASCULAR STRESS TEST  11-26-2012   [name]  @ [facility] ([place]   normal nuclear study w/ no ischemia/  normal LV function and wall motion , ef 60% (find in care everywhere,epic)  . CATARACT EXTRACTION W/ INTRAOCULAR LENS  IMPLANT, BILATERAL  2015  . CYSTOSCOPY WITH LITHOLAPAXY N/A 12/25/2017   Procedure: CYSTOSCOPY WITH LITHOLAPAXY AND FULGERATION;  Surgeon: [name], MD;  [facility];  Service: Urology;  Laterality: N/A;  . INGUINAL HERNIA REPAIR Right 1980s  . LOOP RECORDER INSERTION N/A 04/10/2018   Procedure: LOOP RECORDER INSERTION;  Surgeon: [name], MD;  Location: [facility];  Service: Cardiovascular;  Laterality: N/A;  . PENILE PROSTHESIS IMPLANT  12-02-2015   dr [name] @ [place] in [place]  . SHOULDER SURGERY Left 08/2016  . TONSILLECTOMY   child  . TRANSURETHRAL RESECTION OF PROSTATE  2009   AND REPAIR RECURRENT RIGHT INGUINAL HERNIA    Current Medications: Current Meds  Medication Sig  . acetaminophen (TYLENOL) 325 MG tablet Take 2 tablets (650 mg total) by mouth every 6 (six) hours as needed for mild pain (or Fever >/= 101).  Marland Kitchen albuterol (VENTOLIN HFA) 108 (90 Base) MCG/ACT inhaler Inhale 2 puffs into the lungs every 6 (six) hours. (Patient taking differently: Inhale 2 puffs into the lungs every 6 (six) hours as needed for wheezing or shortness of breath.)  . apixaban (ELIQUIS) 5 MG TABS tablet Take 1 tablet (5 mg total) by mouth 2 (two) times daily.  Marland Kitchen b complex vitamins capsule Take 1 capsule by mouth every evening.  . budesonide (ENTOCORT EC) 3 MG 24 hr capsule Take 9 mg by mouth daily.  . Cranberry 400 MG TABS Take 400 mg by mouth daily.  . D-Mannose 500  MG CAPS Take 500 mg by mouth in the morning and at bedtime.  . folic acid (FOLVITE) 1 MG tablet TAKE 1 TABLET BY MOUTH EVERY DAY (Patient taking differently: Take 1 mg by mouth every evening.)  . guaiFENesin-dextromethorphan (ROBITUSSIN DM) 100-10 MG/5ML syrup Take 10 mLs by mouth every 4 (four) hours as needed for cough. (Patient not taking: Reported on 08/26/2020)  . Insulin Glargine-Lixisenatide 100-33 UNT-MCG/ML SOPN Inject 26 Units into the skin at bedtime. Soliqua 100/33  . methotrexate (RHEUMATREX) 15 MG tablet Take 10 mg by mouth every Tuesday. Caution: Chemotherapy. Protect from light.  Marland Kitchen NOVOLOG FLEXPEN 100 UNIT/ML FlexPen Inject 5 Units into the skin every evening.  . pioglitazone (ACTOS) 15 MG tablet Take 15 mg by mouth every morning.   . [DISCONTINUED] metoprolol tartrate (LOPRESSOR) 50 MG tablet Take 1 tablet (50 mg total) by mouth 2 (two) times daily.  . [DISCONTINUED] OXYBUTYNIN CHLORIDE PO Take 1 tablet by mouth daily.  . [DISCONTINUED] vitamin B-12 (CYANOCOBALAMIN) 100 MCG tablet Take 100 mcg by mouth daily.     Allergies:   Bee venom, Metformin and  related, and Sulfamethoxazole-trimethoprim   Social History   Socioeconomic History  . Marital status: Widowed    Spouse name: engaged  . Number of children: 8  . Years of education: college-2  . Highest education level: Not on file  Occupational History  . Occupation: Press photographer  . 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 Smoking status: Former Smoker    Years: 20.00    Types: Cigarettes    Quit date: 05/04/1996    Years since quitting: 24.3  . Smokeless tobacco: Never Used  Vaping Use  . Vaping Use: Never used  Substance and Sexual Activity  . Alcohol use: Yes    Comment: 1-2 glasses of wine every other day  . Drug use: No  . Sexual activity: Never  Other Topics Concern  . Not on file  Social History Narrative  . Not on file   Social Determinants of Health   Financial Resource Strain: Not on file  Food Insecurity: Not on file  Transportation Needs: Not on file  Physical Activity: Not on file  Stress: Not on file  Social Connections: Not on file     Family History: The patient's family history includes Diabetes in his mother; Heart failure in his mother.  ROS:   Please see the history of present illness.   All other systems are reviewed and are negative.  EKGs/Labs/Other Studies Reviewed:    The following studies were reviewed today: Comprehensive download of his loop recorder in the office today. ECHO 02/20/2018  - Left ventricle: The cavity size was normal. Wall thickness was   increased in a pattern of moderate LVH. Systolic function was   normal. The estimated ejection fraction was in the range of 60%   to 65%. Wall motion was normal; there were no regional wall   motion abnormalities. - Aortic valve: Transvalvular velocity was within the normal range.   There was no stenosis. There was no regurgitation. - Mitral valve: Transvalvular velocity was within the normal range.   There was no evidence for stenosis. There was trivial   regurgitation. -  Left atrium: The atrium was moderately dilated. - Right ventricle: The cavity size was normal. Wall thickness was   normal. Systolic function was normal. - Right atrium: The atrium was moderately dilated. - Atrial septum: No defect or patent foramen ovale was identified   by color flow  Doppler. - Tricuspid valve: There was mild regurgitation. - Pulmonary arteries: Systolic pressure was mildly increased. PA   peak pressure: 44 mm Hg (S).   EKG:  EKG is ordered today.  It shows atrial fibrillation with rapid ventricular response at 136 bpm, unchanged QS pattern in leads V1-V2, borderline QTC 454 ms.  Recent Labs: 04/10/2020: Magnesium 2.3 04/17/2020: ALT 40; BUN 15; Creatinine, Ser 0.52; Potassium 4.1; Sodium 135 08/24/2020: Hemoglobin 7.3; Platelet Count 284  Recent Lipid Panel    Component Value Date/Time   CHOL 111 05/05/2016 0402   TRIG 111 04/07/2020 0055   HDL 27 (L) 05/05/2016 0402   CHOLHDL 4.1 05/05/2016 0402   VLDL 21 05/05/2016 0402   LDLCALC 63 05/05/2016 0402    Physical Exam:    VS:  BP 128/76 (BP Location: Left Arm, Patient Position: Sitting, Cuff Size: Normal)   Pulse (!) 126   Ht 5\' 10"  (1.778 m)   Wt 175 lb (79.4 kg)   BMI 25.11 kg/m     Wt Readings from Last 3 Encounters:  08/26/20 175 lb (79.4 kg)  08/18/20 167 lb (75.8 kg)  06/29/20 168 lb 12 oz (76.5 kg)       General: Alert, oriented x3, no distress, maybe slightly tachypneic (RR 20), pale Head: no evidence of trauma, PERRL, EOMI, no exophtalmos or lid lag, no myxedema, no xanthelasma; normal ears, nose and oropharynx Neck: normal jugular venous pulsations and no hepatojugular reflux; brisk carotid pulses without delay and no carotid bruits Chest: clear to auscultation, no signs of consolidation by percussion or palpation, normal fremitus, symmetrical and full respiratory excursions Cardiovascular: normal position and quality of the apical impulse, rapid irregular rhythm, normal first and second  heart sounds, no murmurs, rubs or gallops Abdomen: no tenderness or distention, no masses by palpation, no abnormal pulsatility or arterial bruits, normal bowel sounds, no hepatosplenomegaly Extremities: no clubbing, cyanosis or edema; 2+ radial, ulnar and brachial pulses bilaterally; 2+ right femoral, posterior tibial and dorsalis pedis pulses; 2+ left femoral, posterior tibial and dorsalis pedis pulses; no subclavian or femoral bruits Neurological: grossly nonfocal Psych: Normal mood and affect     ASSESSMENT:    1. Persistent atrial fibrillation (HCC)   2. Encounter for loop recorder check   3. Large granular lymphocytic leukemia (HCC)   4. Type 2 diabetes mellitus without complication, without long-term current use of insulin (HCC)   5. Aortic atherosclerosis (HCC)    PLAN:    In order of problems listed above:  1. AFib: He is tolerating the atrial fibrillation relatively well, but remains in RVR and I am concerned that this in combination with his anemia may lead to decompensation.  I think he will need a cardioversion since he is not easy to rate control.  We discussed her options.  Rather than wait for 3 weeks of full anticoagulation we will plan for TEE cardioversion next week. This procedure has been fully reviewed with the patient and written informed consent has been obtained.  He should not interrupt the apixaban between now and then.  We will increase the metoprolol to 75 mg twice daily.  I am concerned about the risk of bradycardia postprocedure (in sinus rhythm, his heart rate was 50 prior to the onset of atrial fibrillation.  For the same reason would not want to start antiarrhythmic medications just yet. 2. ILR: Scheduled back for monthly device clinic downloads. 3. Leukemia/chronic anemia: He does not have iron deficiency.  Hemoglobin was 7.3 before receiving  1 unit of PRBC on January 15. 4. DM: Acceptable control, most recent hemoglobin A1c 7.5%, no history of coronary or  peripheral vascular disease.  Does have aortic atherosclerosis on imaging studies. 5. Aortic and coronary atherosclerosis: Noted on CT chest.  He has never had angina pectoris, but he has never had any functional studies for CAD either.  LDL cholesterol was 63 in 2017 and 70 in 2019, without medications.  He has normal left ventricular systolic function.  All his risk factors appear to be adequately addressed.  Shared Decision Making/Informed Consent The risks [stroke, cardiac arrhythmias rarely resulting in the need for a temporary or permanent pacemaker, skin irritation or burns, esophageal damage, perforation (1:10,000 risk), bleeding, pharyngeal hematoma as well as other potential complications associated with conscious sedation including aspiration, arrhythmia, respiratory failure and death], benefits (treatment guidance, restoration of normal sinus rhythm, diagnostic support) and alternatives of a transesophageal echocardiogram guided cardioversion were discussed in detail with Mr. Walling and he is willing to proceed.      Medication Adjustments/Labs and Tests Ordered: Current medicines are reviewed at length with the patient today.  Concerns regarding medicines are outlined above.  Orders Placed This Encounter  Procedures  . Basic metabolic panel  . CBC  . EKG 12-Lead   Meds ordered this encounter  Medications  . metoprolol tartrate 75 MG TABS    Sig: Take 75 mg by mouth 2 (two) times daily.    Dispense:  60 tablet    Refill:  11    No refill needed. Please note dose change    Patient Instructions  Medication Instructions:  INCREASE the Metoprolol Tartrate to 75 mg twice daily  *If you need a refill on your cardiac medications before your next appointment, please call your pharmacy*   Lab Work: Your provider would like for you to have the following labs today: CBC and BMET  If you have labs (blood work) drawn today and your tests are completely normal, you will receive  your results only by: Marland Kitchen MyChart Message (if you have MyChart) OR . A paper copy in the mail If you have any lab test that is abnormal or we need to change your treatment, we will call you to review the results.  Follow-Up: At [facility], you and your health needs are our priority.  As part of our continuing mission to provide you with exceptional heart care, we have created designated Provider Care Teams.  These Care Teams include your primary Cardiologist (physician) and Advanced Practice Providers (APPs -  Physician Assistants and Nurse Practitioners) who all work together to provide you with the care you need, when you need it.  We recommend signing up for the patient portal called "MyChart".  Sign up information is provided on this After Visit Summary.  MyChart is used to connect with patients for Virtual Visits (Telemedicine).  Patients are able to view lab/test results, encounter notes, upcoming appointments, etc.  Non-urgent messages can be sent to your provider as well.   To learn more about what you can do with MyChart, go to [email or web address].    Your next appointment:   1 month(s)  The format for your next appointment:   In Person  Provider:   You may see [name], MD or one of the following Advanced Practice Providers on your designated Care Team:    Almyra Deforest, PA-C  Fabian Sharp, [place] or   Roby Lofts, [place]    Other Instructions  You are scheduled for  a TEE/ Cardioversion on 08/31/20 with Dr. Sallyanne Kuster.  Please arrive at the [facility] (Main Entrance A) at [facility]: [address] [place], [place] [address] at 11 am. (1 hour prior to procedure)  DIET: Nothing to eat or drink after midnight except a sip of water with medications (see medication instructions below)  Medication Instructions: Hold your diabetic medication the morning of the procedure  Continue your anticoagulant: Eliquis. If you miss a dose, please call [phone].  You  will need to continue your anticoagulant after your procedure until you  are told by your provider that it is safe to stop   Labs:  You will need to have the coronavirus test completed prior to your procedure. An appointment has been made at 2:35 pm on 08/27/20. This is a Drive Up Visit at [address], [place],  [address]. Please tell them that you are there for procedure testing. Stay in your car and someone will be with you shortly. Please make sure to have all other labs completed before this test because you will need to stay quarantined until your procedure.   You must have a responsible person to drive you home and stay in the waiting area during your procedure. Failure to do so could result in cancellation.  Bring your insurance cards.  *Special Note: Every effort is made to have your procedure done on time. Occasionally there are emergencies that occur at the hospital that may cause delays. Please be patient if a delay does occur.       Signed, [name], MD  08/26/2020 5:16 PM    [place]

## 2020-08-27 ENCOUNTER — Other Ambulatory Visit (HOSPITAL_COMMUNITY)
Admission: RE | Admit: 2020-08-27 | Discharge: 2020-08-27 | Disposition: A | Discharge status: Home / Self Care | Payer: Medicare HMO | Source: Ambulatory Visit | Attending: Cardiovascular Disease | Admitting: Cardiovascular Disease

## 2020-08-27 DIAGNOSIS — Z20822 Contact with and (suspected) exposure to covid-19: Secondary | ICD-10-CM | POA: Insufficient documentation

## 2020-08-27 DIAGNOSIS — Z01812 Encounter for preprocedural laboratory examination: Secondary | ICD-10-CM | POA: Insufficient documentation

## 2020-08-27 LAB — SARS CORONAVIRUS 2 (TAT 6-24 HRS): SARS Coronavirus 2: NEGATIVE

## 2020-08-30 ENCOUNTER — Other Ambulatory Visit: Payer: Self-pay | Admitting: *Deleted

## 2020-08-30 DIAGNOSIS — I4819 Other persistent atrial fibrillation: Secondary | ICD-10-CM

## 2020-08-31 ENCOUNTER — Other Ambulatory Visit: Payer: Self-pay

## 2020-08-31 ENCOUNTER — Ambulatory Visit (HOSPITAL_BASED_OUTPATIENT_CLINIC_OR_DEPARTMENT_OTHER): Payer: Medicare HMO

## 2020-08-31 ENCOUNTER — Ambulatory Visit (HOSPITAL_COMMUNITY): Payer: Medicare HMO | Admitting: Certified Registered Nurse Anesthetist

## 2020-08-31 ENCOUNTER — Telehealth: Payer: Self-pay | Admitting: Hematology

## 2020-08-31 ENCOUNTER — Encounter (HOSPITAL_COMMUNITY): Payer: Self-pay | Admitting: Cardiovascular Disease

## 2020-08-31 ENCOUNTER — Ambulatory Visit (HOSPITAL_COMMUNITY)
Admission: RE | Admit: 2020-08-31 | Discharge: 2020-08-31 | Disposition: A | Discharge status: Home / Self Care | Payer: Medicare HMO | Attending: Cardiovascular Disease | Admitting: Cardiovascular Disease

## 2020-08-31 ENCOUNTER — Encounter (HOSPITAL_COMMUNITY)
Admission: RE | Disposition: A | Discharge status: Home / Self Care | Payer: Self-pay | Source: Home / Self Care | Attending: Cardiovascular Disease

## 2020-08-31 DIAGNOSIS — I081 Rheumatic disorders of both mitral and tricuspid valves: Secondary | ICD-10-CM | POA: Insufficient documentation

## 2020-08-31 DIAGNOSIS — I4891 Unspecified atrial fibrillation: Secondary | ICD-10-CM

## 2020-08-31 DIAGNOSIS — Z85828 Personal history of other malignant neoplasm of skin: Secondary | ICD-10-CM | POA: Insufficient documentation

## 2020-08-31 DIAGNOSIS — E119 Type 2 diabetes mellitus without complications: Secondary | ICD-10-CM | POA: Insufficient documentation

## 2020-08-31 DIAGNOSIS — Z882 Allergy status to sulfonamides status: Secondary | ICD-10-CM | POA: Insufficient documentation

## 2020-08-31 DIAGNOSIS — I4819 Other persistent atrial fibrillation: Secondary | ICD-10-CM | POA: Insufficient documentation

## 2020-08-31 DIAGNOSIS — I361 Nonrheumatic tricuspid (valve) insufficiency: Secondary | ICD-10-CM

## 2020-08-31 DIAGNOSIS — I340 Nonrheumatic mitral (valve) insufficiency: Secondary | ICD-10-CM | POA: Diagnosis not present

## 2020-08-31 DIAGNOSIS — Z79899 Other long term (current) drug therapy: Secondary | ICD-10-CM | POA: Insufficient documentation

## 2020-08-31 DIAGNOSIS — Z87891 Personal history of nicotine dependence: Secondary | ICD-10-CM | POA: Diagnosis not present

## 2020-08-31 DIAGNOSIS — I700 Atherosclerosis of aorta: Secondary | ICD-10-CM | POA: Insufficient documentation

## 2020-08-31 DIAGNOSIS — Z856 Personal history of leukemia: Secondary | ICD-10-CM | POA: Diagnosis not present

## 2020-08-31 DIAGNOSIS — E785 Hyperlipidemia, unspecified: Secondary | ICD-10-CM | POA: Diagnosis not present

## 2020-08-31 DIAGNOSIS — Z7901 Long term (current) use of anticoagulants: Secondary | ICD-10-CM | POA: Insufficient documentation

## 2020-08-31 DIAGNOSIS — Z794 Long term (current) use of insulin: Secondary | ICD-10-CM | POA: Diagnosis not present

## 2020-08-31 DIAGNOSIS — A419 Sepsis, unspecified organism: Secondary | ICD-10-CM | POA: Diagnosis not present

## 2020-08-31 DIAGNOSIS — R6521 Severe sepsis with septic shock: Secondary | ICD-10-CM | POA: Diagnosis not present

## 2020-08-31 HISTORY — PX: TEE WITHOUT CARDIOVERSION: SHX5443

## 2020-08-31 HISTORY — PX: CARDIOVERSION: SHX1299

## 2020-08-31 LAB — POCT I-STAT, CHEM 8
BUN: 10 mg/dL (ref 8–23)
Calcium, Ion: 1.18 mmol/L (ref 1.15–1.40)
Chloride: 103 mmol/L (ref 98–111)
Creatinine, Ser: 0.4 mg/dL — ABNORMAL LOW (ref 0.61–1.24)
Glucose, Bld: 97 mg/dL (ref 70–99)
HCT: 27 % — ABNORMAL LOW (ref 39.0–52.0)
Hemoglobin: 9.2 g/dL — ABNORMAL LOW (ref 13.0–17.0)
Potassium: 3.5 mmol/L (ref 3.5–5.1)
Sodium: 139 mmol/L (ref 135–145)
TCO2: 24 mmol/L (ref 22–32)

## 2020-08-31 SURGERY — CARDIOVERSION
Anesthesia: General

## 2020-08-31 MED ORDER — PROPOFOL 10 MG/ML IV BOLUS
INTRAVENOUS | Status: DC | PRN
  Administered 2020-08-31: 20 mg via INTRAVENOUS

## 2020-08-31 MED ORDER — LIDOCAINE 2% (20 MG/ML) 5 ML SYRINGE
INTRAMUSCULAR | Status: DC | PRN
  Administered 2020-08-31: 60 mg via INTRAVENOUS

## 2020-08-31 MED ORDER — APIXABAN 5 MG PO TABS
5.0000 mg | ORAL_TABLET | Freq: Once | ORAL | Status: AC
  Administered 2020-08-31: 5 mg via ORAL
  Filled 2020-08-31: qty 1

## 2020-08-31 MED ORDER — PROPOFOL 500 MG/50ML IV EMUL
INTRAVENOUS | Status: DC | PRN
  Administered 2020-08-31: 85 ug/kg/min via INTRAVENOUS

## 2020-08-31 MED ORDER — SODIUM CHLORIDE 0.9 % IV SOLN: INTRAVENOUS | Status: DC

## 2020-08-31 MED ORDER — SODIUM CHLORIDE 0.9 % IV SOLN: INTRAVENOUS | Status: DC | PRN

## 2020-08-31 NOTE — Interval H&P Note (Signed)
History and Physical Interval Note:  08/31/2020 12:00 PM  Tommy Peterson  has presented today for surgery, with the diagnosis of AFIB.  The various methods of treatment have been discussed with the patient and family. After consideration of risks, benefits and other options for treatment, the patient has consented to  Procedure(s): CARDIOVERSION (N/A) TRANSESOPHAGEAL ECHOCARDIOGRAM (TEE) (N/A) as a surgical intervention.  The patient's history has been reviewed, patient examined, no change in status, stable for surgery.  I have reviewed the patient's chart and labs.  Questions were answered to the patient's satisfaction.     Emmanuela Ghazi

## 2020-08-31 NOTE — Anesthesia Procedure Notes (Signed)
Procedure Name: MAC Date/Time: 08/31/2020 12:13 PM Performed by: Inda Coke, CRNA Pre-anesthesia Checklist: Patient identified, Emergency Drugs available, Suction available, Timeout performed and Patient being monitored Patient Re-evaluated:Patient Re-evaluated prior to induction Oxygen Delivery Method: Nasal cannula Induction Type: IV induction Dental Injury: Teeth and Oropharynx as per pre-operative assessment

## 2020-08-31 NOTE — Telephone Encounter (Signed)
Scheduled per 01/18 los, patient has been called and voicemail was left.

## 2020-08-31 NOTE — Anesthesia Postprocedure Evaluation (Signed)
Anesthesia Post Note  Patient: Tommy Peterson  Procedure(s) Performed: CARDIOVERSION (N/A ) TRANSESOPHAGEAL ECHOCARDIOGRAM (TEE) (N/A )     Patient location during evaluation: PACU Anesthesia Type: General Level of consciousness: awake and alert Pain management: pain level controlled Vital Signs Assessment: post-procedure vital signs reviewed and stable Respiratory status: spontaneous breathing, nonlabored ventilation and respiratory function stable Cardiovascular status: blood pressure returned to baseline and stable Postop Assessment: no apparent nausea or vomiting Anesthetic complications: no   No complications documented.  Last Vitals:  Vitals:   08/31/20 1240 08/31/20 1247  BP: (!) 113/45 (!) 111/42  Pulse: 77 77  Resp: 17 18  Temp:    SpO2: 95% 100%    Last Pain:  Vitals:   08/31/20 1247  TempSrc:   PainSc: 0-No pain                 Audry Pili

## 2020-08-31 NOTE — Progress Notes (Signed)
  Echocardiogram Echocardiogram Transesophageal has been performed.  Tommy Peterson 08/31/2020, 12:38 PM

## 2020-08-31 NOTE — Op Note (Signed)
INDICATIONS: persistent atrial fibrillation  PROCEDURE:   Informed consent was obtained prior to the procedure. The risks, benefits and alternatives for the procedure were discussed and the patient comprehended these risks.  Risks include, but are not limited to, cough, sore throat, vomiting, nausea, somnolence, esophageal and stomach trauma or perforation, bleeding, low blood pressure, aspiration, pneumonia, infection, trauma to the teeth and death.    During this procedure the patient was administered IV propofol by Anesthesiology.  The transesophageal probe was inserted in the esophagus and stomach without difficulty and multiple views were obtained.  The patient was kept under observation until the patient left the procedure room.  The patient left the procedure room in stable condition.   Agitated microbubble saline contrast was not administered.  COMPLICATIONS:    There were no immediate complications.  FINDINGS:  NO left atrial thrombus. Moderately dilated left atrium. Normal LV function. 2+ TR.  RECOMMENDATIONS:     Proceed w DCCV.  Time Spent Directly with the Patient:  30 minutes   Jullie Arps 08/31/2020, 12:28 PM

## 2020-08-31 NOTE — Transfer of Care (Signed)
Immediate Anesthesia Transfer of Care Note  Patient: Tommy Peterson  Procedure(s) Performed: CARDIOVERSION (N/A ) TRANSESOPHAGEAL ECHOCARDIOGRAM (TEE) (N/A )  Patient Location: Endoscopy Unit  Anesthesia Type:General  Level of Consciousness: awake and alert   Airway & Oxygen Therapy: Patient Spontanous Breathing  Post-op Assessment: Report given to RN and Post -op Vital signs reviewed and stable  Post vital signs: Reviewed and stable  Last Vitals:  Vitals Value Taken Time  BP 117/48 08/31/20 1231  Temp 36.7 C 08/31/20 1231  Pulse 80 08/31/20 1235  Resp 20 08/31/20 1235  SpO2 100 % 08/31/20 1235  Vitals shown include unvalidated device data.  Last Pain:  Vitals:   08/31/20 1231  TempSrc: Temporal  PainSc: Asleep         Complications: No complications documented.

## 2020-08-31 NOTE — Anesthesia Preprocedure Evaluation (Signed)
Anesthesia Evaluation  Patient identified by MRN, date of birth, ID band Patient awake    Reviewed: Allergy & Precautions, NPO status , Patient's Chart, lab work & pertinent test results, reviewed documented beta blocker date and time   Airway Mallampati: II       Dental no notable dental hx.    Pulmonary former smoker,    Pulmonary exam normal        Cardiovascular hypertension, Pt. on home beta blockers + Peripheral Vascular Disease  + dysrhythmias Atrial Fibrillation  Rhythm:Irregular Rate:Tachycardia     Neuro/Psych Anxiety Depression    GI/Hepatic negative GI ROS, Neg liver ROS, GERD  Medicated,  Endo/Other  diabetes, Type 2, Insulin Dependent  Renal/GU negative Renal ROS  negative genitourinary   Musculoskeletal  (+) Arthritis , Osteoarthritis,  Monoclonal gamopathy   Abdominal Normal abdominal exam  (+)   Peds  Hematology  (+) Blood dyscrasia, anemia ,   Anesthesia Other Findings Left ventricular ejection fraction, by estimation, is 55 to 60%. The  left ventricle has normal function. The left ventricle has no regional  wall motion abnormalities. There is mild left ventricular hypertrophy.  Left ventricular diastolic parameters  are indeterminate.  2. Right ventricular systolic function is normal. The right ventricular  size is normal. There is mildly elevated pulmonary artery systolic  pressure.  3. Left atrial size was mildly dilated.  4. Pericardial effusion measuring up to 0.8 cm. a small pericardial  effusion is present. There is no evidence of cardiac tamponade.  5. The mitral valve is normal in structure. Trivial mitral valve  regurgitation. No evidence of mitral stenosis.  6. The aortic valve is calcified. There is mild calcification of the  aortic valve. There is mild thickening of the aortic valve. Aortic valve  regurgitation is not visualized. No aortic stenosis is present.  7. The  inferior vena cava is dilated in size with <50% respiratory  variability, suggesting right atrial pressure of 15 mmHg.   FINDINGS  Reproductive/Obstetrics                             Lab Results  Component Value Date   WBC 11.7 (H) 08/24/2020   HGB 9.2 (L) 08/31/2020   HCT 27.0 (L) 08/31/2020   MCV 85.8 08/24/2020   PLT 284 08/24/2020    Anesthesia Physical  Anesthesia Plan  ASA: III  Anesthesia Plan: General   Post-op Pain Management:    Induction: Intravenous  PONV Risk Score and Plan: Treatment may vary due to age or medical condition  Airway Management Planned: Natural Airway and Mask  Additional Equipment: TEE  Intra-op Plan:   Post-operative Plan:   Informed Consent: I have reviewed the patients History and Physical, chart, labs and discussed the procedure including the risks, benefits and alternatives for the proposed anesthesia with the patient or authorized representative who has indicated his/her understanding and acceptance.     Dental advisory given  Plan Discussed with: CRNA  Anesthesia Plan Comments:         Anesthesia Quick Evaluation

## 2020-08-31 NOTE — Op Note (Signed)
Procedure: Electrical Cardioversion Indications:  Atrial Fibrillation  Procedure Details:  Consent: Risks of procedure as well as the alternatives and risks of each were explained to the (patient/caregiver).  Consent for procedure obtained.  Time Out: Verified patient identification, verified procedure, site/side was marked, verified correct patient position, special equipment/implants available, medications/allergies/relevent history reviewed, required imaging and test results available.  Performed  Patient placed on cardiac monitor, pulse oximetry, supplemental oxygen as necessary.  Sedation given: IV propofol Pacer pads placed anterior and posterior chest.  Cardioverted 1 time(s).  Cardioversion with synchronized biphasic 120J shock.  Evaluation: Findings: Post procedure EKG shows: NSR Complications: None Patient did tolerate procedure well.  Time Spent Directly with the Patient:  15 minutes   Devery Odwyer 08/31/2020, 12:29 PM

## 2020-09-02 ENCOUNTER — Encounter (HOSPITAL_COMMUNITY): Payer: Self-pay | Admitting: Cardiovascular Disease

## 2020-09-08 ENCOUNTER — Inpatient Hospital Stay: Payer: Medicare HMO

## 2020-09-08 ENCOUNTER — Other Ambulatory Visit: Payer: Medicare HMO

## 2020-09-08 ENCOUNTER — Other Ambulatory Visit: Payer: Self-pay

## 2020-09-08 ENCOUNTER — Other Ambulatory Visit: Payer: Self-pay | Admitting: *Deleted

## 2020-09-08 ENCOUNTER — Inpatient Hospital Stay: Payer: Medicare HMO | Attending: Hematology

## 2020-09-08 ENCOUNTER — Telehealth: Payer: Self-pay | Admitting: *Deleted

## 2020-09-08 DIAGNOSIS — Z8616 Personal history of COVID-19: Secondary | ICD-10-CM | POA: Diagnosis not present

## 2020-09-08 DIAGNOSIS — E1165 Type 2 diabetes mellitus with hyperglycemia: Secondary | ICD-10-CM | POA: Insufficient documentation

## 2020-09-08 DIAGNOSIS — D649 Anemia, unspecified: Secondary | ICD-10-CM | POA: Diagnosis not present

## 2020-09-08 DIAGNOSIS — C91Z0 Other lymphoid leukemia not having achieved remission: Secondary | ICD-10-CM

## 2020-09-08 DIAGNOSIS — Z87891 Personal history of nicotine dependence: Secondary | ICD-10-CM | POA: Diagnosis not present

## 2020-09-08 DIAGNOSIS — Z79899 Other long term (current) drug therapy: Secondary | ICD-10-CM | POA: Insufficient documentation

## 2020-09-08 DIAGNOSIS — D472 Monoclonal gammopathy: Secondary | ICD-10-CM | POA: Diagnosis not present

## 2020-09-08 LAB — CMP (CANCER CENTER ONLY)
ALT: 32 U/L (ref 0–44)
AST: 13 U/L — ABNORMAL LOW (ref 15–41)
Albumin: 3.2 g/dL — ABNORMAL LOW (ref 3.5–5.0)
Alkaline Phosphatase: 91 U/L (ref 38–126)
Anion gap: 8 (ref 5–15)
BUN: 11 mg/dL (ref 8–23)
CO2: 26 mmol/L (ref 22–32)
Calcium: 8.5 mg/dL — ABNORMAL LOW (ref 8.9–10.3)
Chloride: 104 mmol/L (ref 98–111)
Creatinine: 0.74 mg/dL (ref 0.61–1.24)
GFR, Estimated: >60 mL/min (ref >60)
Glucose, Bld: 251 mg/dL — ABNORMAL HIGH (ref 70–99)
Potassium: 4.5 mmol/L (ref 3.5–5.1)
Sodium: 138 mmol/L (ref 135–145)
Total Bilirubin: 1.1 mg/dL (ref 0.3–1.2)
Total Protein: 6.3 g/dL — ABNORMAL LOW (ref 6.5–8.1)

## 2020-09-08 LAB — SAMPLE TO BLOOD BANK

## 2020-09-08 LAB — CBC WITH DIFFERENTIAL/PLATELET
Abs Immature Granulocytes: 0.19 10*3/uL — ABNORMAL HIGH (ref 0.00–0.07)
Basophils Absolute: 0 10*3/uL (ref 0.0–0.1)
Basophils Relative: 0 %
Eosinophils Absolute: 0.1 10*3/uL (ref 0.0–0.5)
Eosinophils Relative: 1 %
HCT: 20.1 % — ABNORMAL LOW (ref 39.0–52.0)
Hemoglobin: 6.5 g/dL — CL (ref 13.0–17.0)
Immature Granulocytes: 1 %
Lymphocytes Relative: 32 %
Lymphs Abs: 5.1 10*3/uL — ABNORMAL HIGH (ref 0.7–4.0)
MCH: 27.8 pg (ref 26.0–34.0)
MCHC: 32.3 g/dL (ref 30.0–36.0)
MCV: 85.9 fL (ref 80.0–100.0)
Monocytes Absolute: 1.6 10*3/uL — ABNORMAL HIGH (ref 0.1–1.0)
Monocytes Relative: 10 %
Neutro Abs: 8.8 10*3/uL — ABNORMAL HIGH (ref 1.7–7.7)
Neutrophils Relative %: 56 %
Platelets: 365 10*3/uL (ref 150–400)
RBC: 2.34 MIL/uL — ABNORMAL LOW (ref 4.22–5.81)
RDW: 16.2 % — ABNORMAL HIGH (ref 11.5–15.5)
WBC: 15.8 10*3/uL — ABNORMAL HIGH (ref 4.0–10.5)
nRBC: 0 % (ref 0.0–0.2)

## 2020-09-08 LAB — PREPARE RBC (CROSSMATCH)

## 2020-09-08 MED ORDER — METHYLPREDNISOLONE SODIUM SUCC 125 MG IJ SOLR
INTRAMUSCULAR | Status: AC
  Filled 2020-09-08: qty 2

## 2020-09-08 MED ORDER — ACETAMINOPHEN 325 MG PO TABS
ORAL_TABLET | ORAL | Status: AC
  Filled 2020-09-08: qty 2

## 2020-09-08 MED ORDER — SODIUM CHLORIDE 0.9% FLUSH
10.0000 mL | INTRAVENOUS | Status: DC | PRN
  Filled 2020-09-08: qty 10

## 2020-09-08 MED ORDER — HEPARIN SOD (PORK) LOCK FLUSH 100 UNIT/ML IV SOLN
500.0000 [IU] | Freq: Every day | INTRAVENOUS | Status: DC | PRN
  Filled 2020-09-08: qty 5

## 2020-09-08 MED ORDER — HEPARIN SOD (PORK) LOCK FLUSH 100 UNIT/ML IV SOLN
250.0000 [IU] | INTRAVENOUS | Status: DC | PRN
  Filled 2020-09-08: qty 5

## 2020-09-08 MED ORDER — ACETAMINOPHEN 325 MG PO TABS
650.0000 mg | ORAL_TABLET | Freq: Once | ORAL | Status: AC
  Administered 2020-09-08: 650 mg via ORAL

## 2020-09-08 MED ORDER — METHYLPREDNISOLONE SODIUM SUCC 125 MG IJ SOLR
60.0000 mg | Freq: Once | INTRAMUSCULAR | Status: AC
  Administered 2020-09-08: 60 mg via INTRAVENOUS

## 2020-09-08 MED ORDER — SODIUM CHLORIDE 0.9% IV SOLUTION
250.0000 mL | Freq: Once | INTRAVENOUS | Status: AC
  Administered 2020-09-08: 250 mL via INTRAVENOUS
  Filled 2020-09-08: qty 250

## 2020-09-08 MED ORDER — SODIUM CHLORIDE 0.9% FLUSH
3.0000 mL | INTRAVENOUS | Status: DC | PRN
  Filled 2020-09-08: qty 10

## 2020-09-08 NOTE — Telephone Encounter (Signed)
CRITICAL VALUE STICKER  CRITICAL VALUE: HGB 6.5  RECEIVER (on-site recipient of call): Georgina Pillion, RN  DATE & TIME NOTIFIED: 09/08/20; 1158  MESSENGER (representative from lab): Valerie Salts  MD NOTIFIED: Dr. Irene Limbo   TIME OF NOTIFICATION: 1205  RESPONSE: transfusion orders received

## 2020-09-08 NOTE — Patient Instructions (Signed)
https://www.[facility].org/donate-blood/blood-donation-process/what-happens-to-donated-blood/blood-transfusions/types-of-blood-transfusions.html"> https://www.[facility].org/donate-blood/blood-donation-process/what-happens-to-donated-blood/blood-transfusions/risks-complications.html">  Blood Transfusion, Adult, Care After This sheet gives you information about how to care for yourself after your procedure. Your health care provider may also give you more specific instructions. If you have problems or questions, contact your health care provider. What can I expect after the procedure? After the procedure, it is common to have:  Bruising and soreness where the IV was inserted.  A fever or chills on the day of the procedure. This may be your body's response to the new blood cells received.  A headache. Follow these instructions at home: IV insertion site care  Follow instructions from your health care provider about how to take care of your IV insertion site. Make sure you: ? Wash your hands with soap and water before and after you change your bandage (dressing). If soap and water are not available, use hand sanitizer. ? Change your dressing as told by your health care provider.  Check your IV insertion site every day for signs of infection. Check for: ? Redness, swelling, or pain. ? Bleeding from the site. ? Warmth. ? Pus or a bad smell.      General instructions  Take over-the-counter and prescription medicines only as told by your health care provider.  Rest as told by your health care provider.  Return to your normal activities as told by your health care provider.  Keep all follow-up visits as told by your health care provider. This is important. Contact a health care provider if:  You have itching or red, swollen areas of skin (hives).  You feel anxious.  You feel weak after doing your normal activities.  You have redness, swelling, warmth, or pain around the IV  insertion site.  You have blood coming from the IV insertion site that does not stop with pressure.  You have pus or a bad smell coming from your IV insertion site. Get help right away if:  You have symptoms of a serious allergic or immune system reaction, including: ? Trouble breathing or shortness of breath. ? Swelling of the face or feeling flushed. ? Fever or chills. ? Pain in the head, back, or chest. ? Dark urine or blood in the urine. ? Widespread rash. ? Fast heartbeat. ? Feeling dizzy or light-headed. If you receive your blood transfusion in an outpatient setting, you will be told whom to contact to report any reactions. These symptoms may represent a serious problem that is an emergency. Do not wait to see if the symptoms will go away. Get medical help right away. Call your local emergency services (911 in the [place] Do not drive yourself to the hospital. Summary  Bruising and tenderness around the IV insertion site are common.  Check your IV insertion site every day for signs of infection.  Rest as told by your health care provider. Return to your normal activities as told by your health care provider.  Get help right away for symptoms of a serious allergic or immune system reaction to blood transfusion. This information is not intended to replace advice given to you by your health care provider. Make sure you discuss any questions you have with your health care provider. Document Revised: 01/16/2019 Document Reviewed: 01/16/2019 Elsevier Patient Education  2021 [employer]  

## 2020-09-08 NOTE — Progress Notes (Signed)
Verbal order per Dr. Irene Limbo - 2 units PRBCs today Premeds: Tylenol 650 mg po and Solumedrol 60 mg IV Confirmed orders with [place] in [place] [facility]

## 2020-09-09 LAB — TYPE AND SCREEN
ABO/RH(D): O POS
Antibody Screen: NEGATIVE
Unit division: 0
Unit division: 0

## 2020-09-09 LAB — BPAM RBC
Blood Product Expiration Date: 202202252359
Blood Product Expiration Date: 202202252359
ISSUE DATE / TIME: 202202021329
ISSUE DATE / TIME: 202202021329
Unit Type and Rh: 5100
Unit Type and Rh: 5100

## 2020-09-16 DIAGNOSIS — Z6825 Body mass index (BMI) 25.0-25.9, adult: Secondary | ICD-10-CM | POA: Diagnosis not present

## 2020-09-16 DIAGNOSIS — R197 Diarrhea, unspecified: Secondary | ICD-10-CM | POA: Diagnosis not present

## 2020-09-16 DIAGNOSIS — E1140 Type 2 diabetes mellitus with diabetic neuropathy, unspecified: Secondary | ICD-10-CM | POA: Diagnosis not present

## 2020-09-16 DIAGNOSIS — C91Z0 Other lymphoid leukemia not having achieved remission: Secondary | ICD-10-CM | POA: Diagnosis not present

## 2020-09-16 DIAGNOSIS — Z87440 Personal history of urinary (tract) infections: Secondary | ICD-10-CM | POA: Diagnosis not present

## 2020-09-16 DIAGNOSIS — D472 Monoclonal gammopathy: Secondary | ICD-10-CM | POA: Diagnosis not present

## 2020-09-16 DIAGNOSIS — Z794 Long term (current) use of insulin: Secondary | ICD-10-CM | POA: Diagnosis not present

## 2020-09-16 DIAGNOSIS — K529 Noninfective gastroenteritis and colitis, unspecified: Secondary | ICD-10-CM | POA: Diagnosis not present

## 2020-09-17 ENCOUNTER — Telehealth: Payer: Self-pay | Admitting: Cardiovascular Disease

## 2020-09-17 NOTE — Telephone Encounter (Signed)
STAT if HR is under 50 or over 120 (normal HR is 60-100 beats per minute)  1) What is your heart rate? 127 justnow  2) Do you have a log of your heart rate readings (document readings)? Staying in 120's has been up to 140  3) Do you have any other symptoms? Patient states he has lukemia and has been dehydrated.   Patient states his HR has been high and was 127 when he checked it today. He states the highest was 140.

## 2020-09-17 NOTE — Telephone Encounter (Signed)
Spoke with patient and he was recently been diagnosed with Afib Since couple days after 1/25 procedure patients HR has been elevated Per patient HR running from 112-141 with one reading 90 Confirmed taking Metoprolol 75 mg BID and Eliquis 5 mg both BID  Anemic did have transfusion last week, goes back for follow up 2/15. He is going every 2 weeks  Currently can only get transfused if Hgb less than 7, one pint secondary to shortage. In past normally would get 1-2 pints Blood pressure running 120's/upper 60's-80's Feels weak but does feel better for few days after transfusion, no other s/s  Has follow up 09/29/2020 Will forward to Dr Sallyanne Kuster for review

## 2020-09-17 NOTE — Telephone Encounter (Signed)
Unfortunately it sounds like he may be back in atrial fibrillation I'll ask device clinic to get a live download from his loop recorder. We will need to discuss antiarhhythmic medications before we try another cardioversion.  Did he report BP?

## 2020-09-17 NOTE — Telephone Encounter (Signed)
Spoke with patient and reviewed recommendations Patient does not want to be seen sooner  Advised if worse over weekend to go ED, verbalized understanding  Patient stated this time that with his transfusion it is actually 1 pint if Hgb above 7 and 2 pints if below 7

## 2020-09-17 NOTE — Telephone Encounter (Signed)
If BP > 105/60, please increase the metoprolol to 100 mg BID.

## 2020-09-20 DIAGNOSIS — D649 Anemia, unspecified: Secondary | ICD-10-CM | POA: Diagnosis not present

## 2020-09-20 DIAGNOSIS — E1122 Type 2 diabetes mellitus with diabetic chronic kidney disease: Secondary | ICD-10-CM | POA: Diagnosis not present

## 2020-09-20 DIAGNOSIS — R5383 Other fatigue: Secondary | ICD-10-CM | POA: Diagnosis not present

## 2020-09-20 DIAGNOSIS — D472 Monoclonal gammopathy: Secondary | ICD-10-CM | POA: Diagnosis not present

## 2020-09-20 DIAGNOSIS — Z794 Long term (current) use of insulin: Secondary | ICD-10-CM | POA: Diagnosis not present

## 2020-09-20 NOTE — Progress Notes (Signed)
HEMATOLOGY/ONCOLOGY CLINIC NOTE  Date of Service: 09/20/2020  Patient Care Team: [facility] as PCP - General (Rheumatology) Collier Bullock, MD as Referring Physician (Hematology and Oncology) [name], DO as Referring Physician (Family Medicine)  CHIEF COMPLAINTS/PURPOSE OF CONSULTATION:  Large Granular Lymphocytic Leukemia  Hem/Onc History  1)- chronic leukocytosis (neutrophilia and absolute lymphocytosis), peripheral blood lymphocytes about 5000, about 1/4 of cells have LGL morphology 2)- macrocytic anemia at least since 09/08/2013 (normal CBC on 11/28/2012), MCV now as high as 115, low retics, marrow M:E ratio 15:1 in 05/2016, erythroid hypoplasia 3)- MGUS serum IgG lambda 0.4 g/dL in 07/2014, follow annually 4)- 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 weekly -03/29/2017 increase dose 25 mg po qweek -07/19/2017 increase mtx 30 mg po qweek, stable dose since then   HISTORY OF PRESENTING ILLNESS:  Tommy Peterson is a wonderful 80 y.o. male who has been referred to [place] by Dr [name] for evaluation and management of Large Granular Lymphocytic Leukemia. The pt reports that he is doing well overall.  Pt is here as he is moving his care because his previous Oncologist, Dr. [name], has changed practices. The pt reports that he needed 2 pints of blood every 2 weeks for a year beginning in 2014. Pt has never needed to any erythropoeitin injections. His Hgb has been holding higher  lately, around 10-11. It has been about 7 months since pt has needed his last blood transfusion. Pt has been taking 30 mg of Methotrexate once per week and denies any issues with that dosage. He has continued taking 1 mg Folic Acid once per day. He is also taking a daily multivitamin.   Pt was previously walking a lot but had to slow down due to fatigue. Pt will need a surgery to repair his left inguinal hernia.   In 2017 pt had a C. Diff infection. Pt had recurrent UTI infections in 2019. His last hospitalization for infections was in January 2020. There were no kidney stones or other reasons for repeat infections. Pt denies any pneumonia infections. Pt has had Diabetes for 40+ years and it is currently well controlled. Pt was never given a direct reason for chronic diarrhea but was placed on Budesonide which stopped his symptoms. Pt is currently taking 3 mg twice per week. He notes that he has been experiencing some urinary frequency, but does feel like he is completely emptying his bladder when he uses the restroom. Pt has seen a Urologist who gave him some medication that caused him to urinate up to 30 times per day. He has tried Flomax previously, and still has some at home, but notes that it does not help much. Pt is following with a Dermatologist , Dr. Rhona Raider, for his skin Squamous Cell Carcinoma. He is currently using Efudex to treat. He was having some night sweats about a year ago, but has had none recently.   Most recent lab results (08/28/2019) of CBC w/diff and CMP is as follows: all values are WNL except for Hgb at  11.8, HCT at 35.1, MCV at 122, MCH at 41.1, RDW at 2.87, RDW at 16.0, Abs Immature Granulocyte at 0.11K, Immature Granulocyte Rel at 1.2, Mono Abs at 1.0K, Creatinine at 0.5, Glucose at 199.  On review of systems, pt reports constipation, fatigue and denies diarrhea, unexpected weight loss, fevers, chills, night sweats, abdominal pain, leg swelling and any other symptoms.    On PMHx the pt reports BPH, Chronic Diarrhea, Chronic Large Granular Lymphocytic Leukemia, Collagenous Collitis, Clostridium defficile colitis, Chronic UTIs, Transfusion-dependant anemia, Type 2 diabetes mellitus.  INTERVAL HISTORY:   OLIN Tommy Peterson is a wonderful 80 y.o. male who is here for evaluation and management of Large Granular Lymphocytic Leukemia. The patient's last visit with [place] was on 08/24/2020. The pt reports that he is doing well overall. We are joined today by his wife.  The pt reports that he has felt extremely weak since one week following his last transfusion. The pt notes he no longer has had any diarrhea, but is too weak to stand, move, or sit down in chairs without backs in them. He notes he is no longer on any steroids. The pt is taking his Vitamin B12 and B-Complex, noting no bleeding in stools. The pt's wife notes that the GI doctors at [facility] refused to see him, but they have an appointment with a GI at [facility] in May 2022. The pt saw his Endocrinologist for his diabetes checkup and was switched to Insulin Glargine-Lixisenatide, but notes his blood sugar levels have been very elevated since this change. The pt also notes that he has a visit with his Cardiologist on the 23rd for management of his A-fib and medications needed.  The pt notes that the last transfusion he did not feel as good as with the prior infusions. In the past, he felt much stronger and less fatigued. However, this last transfusion he still felt weak and fatigued following. The pt notes he cannot stand for more than 1-2 minutes without needing rest at this time.  Lab results today 09/21/2020 of CBC w/diff and CMP is as follows: all values are WNL except for WBC of 11.7K, RBC of 2.52, Hgb of 7.1, HCT of 21.4, RDW of 16.1, Lymphs Abs of 4.2K, Monocytes Absolute of 1.3K, Abs Immature Granulocytes of 0.10K, Potassium of 3.3, Glucose of 330, Calcium of 8.4, Total Protein of 6.2, Albumin of 3.1, ALT of 50.  On  review of systems, pt reports fatigue, extreme weakness and denies abdominal pain, leg swelling, back pain and any other symptoms.  SURGICAL HISTORY: Past Surgical History:  Procedure Laterality Date  . BONE MARROW BIOPSY Right 05-24-2016;  08-20-2014  . CARDIOVASCULAR STRESS TEST  11-26-2012   [name]  @ [facility] [place]   normal nuclear study w/ no ischemia/  normal LV function and wall motion , ef 60% (find in care everywhere,epic)  . CARDIOVERSION N/A 08/31/2020   Procedure: CARDIOVERSION;  Surgeon: [name], MD;  Location: [facility];  Service: Cardiovascular;  Laterality: N/A;  . CATARACT EXTRACTION W/ INTRAOCULAR LENS  IMPLANT, BILATERAL  2015  . CYSTOSCOPY WITH LITHOLAPAXY N/A 12/25/2017   Procedure: CYSTOSCOPY WITH LITHOLAPAXY AND FULGERATION;  Surgeon: [name], MD;  Location: [facility];  Service: Urology;  Laterality: N/A;  . INGUINAL HERNIA REPAIR Right 1980s  . LOOP RECORDER INSERTION N/A 04/10/2018   Procedure: LOOP RECORDER INSERTION;  Surgeon: [name], MD;  Location: [facility];  Service: Cardiovascular;  Laterality: N/A;  . PENILE PROSTHESIS  IMPLANT  12-02-2015   dr [name] @ [facility] in [place]  . SHOULDER SURGERY Left 08/2016  . TEE WITHOUT CARDIOVERSION N/A 08/31/2020   Procedure: TRANSESOPHAGEAL ECHOCARDIOGRAM (TEE);  Surgeon: [name], MD;  Location: [facility];  Service: Cardiovascular;  Laterality: N/A;  . TONSILLECTOMY  child  . TRANSURETHRAL RESECTION OF PROSTATE  2009   AND REPAIR RECURRENT RIGHT INGUINAL HERNIA    SOCIAL HISTORY: Social History   Socioeconomic History  . Marital status: Widowed    Spouse name: engaged  . Number of children: 8  . Years of education: college-2  . Highest education level: Not on file  Occupational History  . Occupation: Press photographer  . 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 Smoking status: Former Smoker    Years: 20.00    Types:  Cigarettes    Quit date: 05/04/1996    Years since quitting: 24.3  . Smokeless tobacco: Never Used  Vaping Use  . Vaping Use: Never used  Substance and Sexual Activity  . Alcohol use: Yes    Comment: 1-2 glasses of wine every other day  . Drug use: No  . Sexual activity: Never  Other Topics Concern  . Not on file  Social History Narrative  . Not on file   Social Determinants of Health   Financial Resource Strain: Not on file  Food Insecurity: Not on file  Transportation Needs: Not on file  Physical Activity: Not on file  Stress: Not on file  Social Connections: Not on file  Intimate Partner Violence: Not on file    FAMILY HISTORY: Family History  Problem Relation Age of Onset  . Diabetes Mother   . Heart failure Mother     ALLERGIES:  is allergic to bee venom, metformin and related, and sulfamethoxazole-trimethoprim.  MEDICATIONS:  Current Outpatient Medications  Medication Sig Dispense Refill  . acetaminophen (TYLENOL) 325 MG tablet Take 2 tablets (650 mg total) by mouth every 6 (six) hours as needed for mild pain (or Fever >/= 101).    Marland Kitchen albuterol (VENTOLIN HFA) 108 (90 Base) MCG/ACT inhaler Inhale 2 puffs into the lungs every 6 (six) hours. (Patient taking differently: Inhale 2 puffs into the lungs every 6 (six) hours as needed for wheezing or shortness of breath.) 8 g 1  . apixaban (ELIQUIS) 5 MG TABS tablet Take 1 tablet (5 mg total) by mouth 2 (two) times daily. 60 tablet 11  . b complex vitamins capsule Take 1 capsule by mouth every evening.    . budesonide (ENTOCORT EC) 3 MG 24 hr capsule Take 9 mg by mouth daily.    . Cranberry 400 MG TABS Take 400 mg by mouth daily.    . D-Mannose 500 MG CAPS Take 500 mg by mouth in the morning and at bedtime.    . folic acid (FOLVITE) 1 MG tablet TAKE 1 TABLET BY MOUTH EVERY DAY (Patient taking differently: Take 1 mg by mouth every evening.) 90 tablet 3  . Insulin Glargine-Lixisenatide 100-33 UNT-MCG/ML SOPN Inject 26 Units  into the skin at bedtime. Soliqua 100/33    . methotrexate (RHEUMATREX) 15 MG tablet Take 10 mg by mouth every Tuesday. Caution: Chemotherapy. Protect from light.    . metoprolol tartrate (LOPRESSOR) 50 MG tablet Take 50 mg by mouth as directed. Take 2 tablets twice a day of SBP above 105/60    . Misc Natural Products (DAILY HERBS MEMORY PO) Take 1 capsule by mouth daily. (300 MG) Puritans  Pride Neuro-ps    . Misc Natural Products (PROSTATE HEALTH PO) Take 1 capsule by mouth every evening. Prostate Plus Health Complex    . NOVOLOG FLEXPEN 100 UNIT/ML FlexPen Inject 5 Units into the skin every evening.    Marland Kitchen omeprazole (PRILOSEC) 40 MG capsule Take 40 mg by mouth 2 (two) times daily before a meal.    . oxybutynin (DITROPAN) 5 MG tablet Take 5 mg by mouth every evening.    . pioglitazone (ACTOS) 15 MG tablet Take 15 mg by mouth every morning.     . traMADol (ULTRAM) 50 MG tablet Take 50 mg by mouth at bedtime as needed (restlessness).     No current facility-administered medications for this visit.    REVIEW OF SYSTEMS:   10 Point review of Systems was done is negative except as noted above.  PHYSICAL EXAMINATION: ECOG PERFORMANCE STATUS: 1 - Symptomatic but completely ambulatory  Exam was given in a chair.   GENERAL:alert, in no acute distress and comfortable SKIN: no acute rashes, no significant lesions EYES: conjunctiva are pink and non-injected, sclera anicteric OROPHARYNX: MMM, no exudates, no oropharyngeal erythema or ulceration NECK: supple, no JVD LYMPH:  no palpable lymphadenopathy in the cervical, axillary or inguinal regions LUNGS: clear to auscultation b/l with normal respiratory effort HEART: regular rate & rhythm ABDOMEN:  normoactive bowel sounds , non tender, not distended. Extremity: no pedal edema PSYCH: alert & oriented x 3 with fluent speech NEURO: no focal motor/sensory deficits   LABORATORY DATA:  I have reviewed the data as listed  CBC Latest Ref Rng & Units  09/21/2020 09/08/2020 08/31/2020  WBC 4.0 - 10.5 K/uL 11.7(H) 15.8(H) -  Hemoglobin 13.0 - 17.0 g/dL 7.1(L) 6.5(LL) 9.2(L)  Hematocrit 39.0 - 52.0 % 21.4(L) 20.1(L) 27.0(L)  Platelets 150 - 400 K/uL 354 365 -    CMP Latest Ref Rng & Units 09/21/2020 09/08/2020 08/31/2020  Glucose 70 - 99 mg/dL 330(H) 251(H) 97  BUN 8 - 23 mg/dL $Remove'11 11 10  'QqxAAJT$ Creatinine 0.61 - 1.24 mg/dL 0.72 0.74 0.40(L)  Sodium 135 - 145 mmol/L 136 138 139  Potassium 3.5 - 5.1 mmol/L 3.3(L) 4.5 3.5  Chloride 98 - 111 mmol/L 102 104 103  CO2 22 - 32 mmol/L 25 26 -  Calcium 8.9 - 10.3 mg/dL 8.4(L) 8.5(L) -  Total Protein 6.5 - 8.1 g/dL 6.2(L) 6.3(L) -  Total Bilirubin 0.3 - 1.2 mg/dL 1.1 1.1 -  Alkaline Phos 38 - 126 U/L 91 91 -  AST 15 - 41 U/L 26 13(L) -  ALT 0 - 44 U/L 50(H) 32 -   09/28/2016 Flow Cytometry:   RADIOGRAPHIC STUDIES: I have personally reviewed the radiological images as listed and agreed with the findings in the report. ECHO TEE  Result Date: 09/03/2020    TRANSESOPHOGEAL ECHO REPORT   Patient Name:   Tommy Peterson Date of Exam: 08/31/2020 Medical Rec #:  [id number]         Height:       70.0 in Accession #:    [id number]        Weight:       175.0 lb Date of Birth:  11-29-1940          BSA:          1.972 m Patient Age:    65 years          BP:           110/68 mmHg Patient Gender: M  HR:           103 bpm. Exam Location:  Outpatient Procedure: 3D Echo, Transesophageal Echo, Cardiac Doppler and Color Doppler Indications:     I48.91* Unspeicified atrial fibrillation  History:         Patient has prior history of Echocardiogram examinations, most                  recent 08/20/2020. Abnormal ECG, Arrythmias:Atrial Fibrillation,                  Signs/Symptoms:Chest Pain; Risk Factors:Hypertension, Diabetes                  and Dyslipidemia.  Sonographer:     Roseanna Rainbow RDCS Referring Phys:  [place] Diagnosing Phys: [name] MD PROCEDURE: After discussion of the risks and benefits of a  TEE, an informed consent was obtained from the patient. The transesophogeal probe was passed without difficulty through the esophogus of the patient. Imaged were obtained with the patient in a left lateral decubitus position. Sedation performed by different physician. The patient was monitored while under deep sedation. Anesthestetic sedation was provided intravenously by Anesthesiology: 105mg  of Propofol, 60mg  of Lidocaine. The patient developed no complications during the procedure. A successful direct current cardioversion was performed at 120 joules with 1 attempt. IMPRESSIONS  1. Left ventricular ejection fraction, by estimation, is 55 to 60%. The left ventricle has normal function. The left ventricle has no regional wall motion abnormalities. Left ventricular diastolic function could not be evaluated.  2. Right ventricular systolic function is normal. The right ventricular size is normal. There is normal pulmonary artery systolic pressure.  3. Left atrial size was moderately dilated. No left atrial/left atrial appendage thrombus was detected. The LAA emptying velocity was 30 cm/s.  4. Right atrial size was mildly dilated.  5. The mitral valve is normal in structure. Mild mitral valve regurgitation.  6. Tricuspid valve regurgitation is mild to moderate.  7. The aortic valve is tricuspid. Aortic valve regurgitation is not visualized. No aortic stenosis is present.  8. There is Moderate (Grade III) protruding plaque involving the descending aorta. FINDINGS  Left Ventricle: Left ventricular ejection fraction, by estimation, is 55 to 60%. The left ventricle has normal function. The left ventricle has no regional wall motion abnormalities. The left ventricular internal cavity size was normal in size. There is  no left ventricular hypertrophy. Left ventricular diastolic function could not be evaluated due to atrial fibrillation. Left ventricular diastolic function could not be evaluated. Right Ventricle: The right  ventricular size is normal. No increase in right ventricular wall thickness. Right ventricular systolic function is normal. There is normal pulmonary artery systolic pressure. The tricuspid regurgitant velocity is 2.61 m/s, and  with an assumed right atrial pressure of 3 mmHg, the estimated right ventricular systolic pressure is 80.9 mmHg. Left Atrium: Left atrial size was moderately dilated. Spontaneous echo contrast was present in the left atrium. No left atrial/left atrial appendage thrombus was detected. The LAA emptying velocity was 30 cm/s. Right Atrium: Right atrial size was mildly dilated. Pericardium: There is no evidence of pericardial effusion. Mitral Valve: The mitral valve is normal in structure. Mild mitral valve regurgitation, with centrally-directed jet. Tricuspid Valve: The tricuspid valve is normal in structure. Tricuspid valve regurgitation is mild to moderate. Aortic Valve: The aortic valve is tricuspid. Aortic valve regurgitation is not visualized. No aortic stenosis is present. Pulmonic Valve: The pulmonic valve was normal in structure.  Pulmonic valve regurgitation is not visualized. Aorta: The aortic root, ascending aorta, aortic arch and descending aorta are all structurally normal, with no evidence of dilitation or obstruction. There is moderate (Grade III) protruding plaque involving the descending aorta. IAS/Shunts: No atrial level shunt detected by color flow Doppler.  TRICUSPID VALVE TR Peak grad:   27.2 mmHg TR Vmax:        261.00 cm/s Mihai Croitoru MD Electronically signed by [name] MD Signature Date/Time: 09/03/2020/1:48:45 PM    Final     ASSESSMENT & PLAN:   80 yo with   1) T cell large granular leukemia s/p transfusion dependent anemia - previous blood transfusion dependent and was last on MTX $Remo'30mg'urYHp$  po weekly a few months ago 2) IgG Lambda MGUS 3) recent COVID-19 infection  PLAN: -Discussed pt labwork today, 09/21/2020; anemic, uncontrolled diabetes, and Hgb of  7.1. Uncontrolled blood sugars. Bilirubin stable. Protein levels stable. -Advised pt that LGL is primarily making him anemic and prominent driver for weakness and fatigue. -Discussed life expectancy of cell in transfused blood versus regular, 60 v 120 days.  -Advised pt his abnormal leukocytes have been stable. Discussed options of continuing to increase Methotrexate dosage from 10-15 mg, but this could suppress his bone marrow further. Advised pt that his counts have been stable with 10 mg, which is reassuring for gradually increasing dosage cautiously.  -Discussed having to halt medications for COVID and gradual restart of Methotrexate for recovery. Advised pt it can take two weeks to three months to get a response and get back acquainted to medication again. -Advised pt that dependence on blood transfusions affects how body can handle the chemotherapy drugs. This is a function of treatment tolerance. -Discussed switching from Methotrexate to Cytoxan. Will have to do this if no response once reached 15-20 mg of Methotrexate. -Advised pt that Methotrexate typically works for 1-4 years. The pt has been on it for 3-4 years prior to [place]. Unsure if this will be responsive in dose-dependant reaction or not. Will continue to achieve response as per pre-COVID infection. -Will get Bm Bx in 3 months if still transfusion dependant. -Recommended pt f/u w Endocrinologist for controlling blood sugars and insulin dosage. -No lab or clinical evidence of Large Granular Lymphocytic Leukemia progression at this time. Will continue watchful observation. -Will increase Methotrexate to 12.5 mg weekly. -Continue 2 mg Folic acid, Vitamin O29, and B-complex daily. -Continue 2 units of PRBC q2weeks with labs -Will see back in 4 weeks with labs.   FOLLOW UP: PLZ schedule for labs and 2 units of PRBC q2weeks x 6 MD visit in 4 weeks    The total time spent in the appointment was 30 minutes and more than 50% was on  counseling and direct patient cares.  All of the patient's questions were answered with apparent satisfaction. The patient knows to call the clinic with any problems, questions or concerns.   Sullivan Lone MD [place] AAHIVMS [facility] Hematology/Oncology Physician [facility]  (Office):       [phone] (Work cell):  [phone] (Fax):           [phone]  09/20/2020 12:16 PM  I, Reinaldo Raddle, am acting as scribe for Dr. Sullivan Lone, MD.     .I have reviewed the above documentation for accuracy and completeness, and I agree with the above. Brunetta Genera MD

## 2020-09-21 ENCOUNTER — Inpatient Hospital Stay (HOSPITAL_BASED_OUTPATIENT_CLINIC_OR_DEPARTMENT_OTHER): Payer: Medicare HMO | Admitting: Hematology

## 2020-09-21 ENCOUNTER — Other Ambulatory Visit: Payer: Self-pay

## 2020-09-21 ENCOUNTER — Inpatient Hospital Stay: Payer: Medicare HMO

## 2020-09-21 ENCOUNTER — Other Ambulatory Visit: Payer: Self-pay | Admitting: *Deleted

## 2020-09-21 VITALS — BP 108/58 | HR 79 | Temp 98.8°F | Resp 20 | Ht 70.0 in | Wt 162.4 lb

## 2020-09-21 DIAGNOSIS — Z87891 Personal history of nicotine dependence: Secondary | ICD-10-CM | POA: Diagnosis not present

## 2020-09-21 DIAGNOSIS — C91Z0 Other lymphoid leukemia not having achieved remission: Secondary | ICD-10-CM

## 2020-09-21 DIAGNOSIS — E1165 Type 2 diabetes mellitus with hyperglycemia: Secondary | ICD-10-CM | POA: Diagnosis not present

## 2020-09-21 DIAGNOSIS — Z8616 Personal history of COVID-19: Secondary | ICD-10-CM | POA: Diagnosis not present

## 2020-09-21 DIAGNOSIS — D649 Anemia, unspecified: Secondary | ICD-10-CM

## 2020-09-21 DIAGNOSIS — Z79899 Other long term (current) drug therapy: Secondary | ICD-10-CM | POA: Diagnosis not present

## 2020-09-21 DIAGNOSIS — D472 Monoclonal gammopathy: Secondary | ICD-10-CM | POA: Diagnosis not present

## 2020-09-21 LAB — CBC WITH DIFFERENTIAL/PLATELET
Abs Immature Granulocytes: 0.1 10*3/uL — ABNORMAL HIGH (ref 0.00–0.07)
Basophils Absolute: 0 10*3/uL (ref 0.0–0.1)
Basophils Relative: 0 %
Eosinophils Absolute: 0.1 10*3/uL (ref 0.0–0.5)
Eosinophils Relative: 1 %
HCT: 21.4 % — ABNORMAL LOW (ref 39.0–52.0)
Hemoglobin: 7.1 g/dL — ABNORMAL LOW (ref 13.0–17.0)
Immature Granulocytes: 1 %
Lymphocytes Relative: 36 %
Lymphs Abs: 4.2 10*3/uL — ABNORMAL HIGH (ref 0.7–4.0)
MCH: 28.2 pg (ref 26.0–34.0)
MCHC: 33.2 g/dL (ref 30.0–36.0)
MCV: 84.9 fL (ref 80.0–100.0)
Monocytes Absolute: 1.3 10*3/uL — ABNORMAL HIGH (ref 0.1–1.0)
Monocytes Relative: 11 %
Neutro Abs: 6 10*3/uL (ref 1.7–7.7)
Neutrophils Relative %: 51 %
Platelets: 354 10*3/uL (ref 150–400)
RBC: 2.52 MIL/uL — ABNORMAL LOW (ref 4.22–5.81)
RDW: 16.1 % — ABNORMAL HIGH (ref 11.5–15.5)
WBC: 11.7 10*3/uL — ABNORMAL HIGH (ref 4.0–10.5)
nRBC: 0 % (ref 0.0–0.2)

## 2020-09-21 LAB — CMP (CANCER CENTER ONLY)
ALT: 50 U/L — ABNORMAL HIGH (ref 0–44)
AST: 26 U/L (ref 15–41)
Albumin: 3.1 g/dL — ABNORMAL LOW (ref 3.5–5.0)
Alkaline Phosphatase: 91 U/L (ref 38–126)
Anion gap: 9 (ref 5–15)
BUN: 11 mg/dL (ref 8–23)
CO2: 25 mmol/L (ref 22–32)
Calcium: 8.4 mg/dL — ABNORMAL LOW (ref 8.9–10.3)
Chloride: 102 mmol/L (ref 98–111)
Creatinine: 0.72 mg/dL (ref 0.61–1.24)
GFR, Estimated: >60 mL/min (ref >60)
Glucose, Bld: 330 mg/dL — ABNORMAL HIGH (ref 70–99)
Potassium: 3.3 mmol/L — ABNORMAL LOW (ref 3.5–5.1)
Sodium: 136 mmol/L (ref 135–145)
Total Bilirubin: 1.1 mg/dL (ref 0.3–1.2)
Total Protein: 6.2 g/dL — ABNORMAL LOW (ref 6.5–8.1)

## 2020-09-21 LAB — SAMPLE TO BLOOD BANK

## 2020-09-21 LAB — PREPARE RBC (CROSSMATCH)

## 2020-09-21 MED ORDER — ACETAMINOPHEN 325 MG PO TABS
ORAL_TABLET | ORAL | Status: AC
  Filled 2020-09-21: qty 2

## 2020-09-21 MED ORDER — METHYLPREDNISOLONE SODIUM SUCC 125 MG IJ SOLR
60.0000 mg | Freq: Once | INTRAMUSCULAR | Status: AC
  Administered 2020-09-21: 60 mg via INTRAVENOUS

## 2020-09-21 MED ORDER — ACETAMINOPHEN 325 MG PO TABS
650.0000 mg | ORAL_TABLET | Freq: Once | ORAL | Status: AC
  Administered 2020-09-21: 650 mg via ORAL

## 2020-09-21 MED ORDER — SODIUM CHLORIDE 0.9% IV SOLUTION
250.0000 mL | Freq: Once | INTRAVENOUS | Status: AC
  Administered 2020-09-21: 250 mL via INTRAVENOUS
  Filled 2020-09-21: qty 250

## 2020-09-21 MED ORDER — METHYLPREDNISOLONE SODIUM SUCC 125 MG IJ SOLR
INTRAMUSCULAR | Status: AC
  Filled 2020-09-21: qty 2

## 2020-09-21 MED ORDER — METHOTREXATE 2.5 MG PO TABS: 12.5000 mg | ORAL_TABLET | ORAL | 1 refills | Status: DC

## 2020-09-21 NOTE — Patient Instructions (Signed)

## 2020-09-22 LAB — BPAM RBC
Blood Product Expiration Date: 202202192359
Blood Product Expiration Date: 202203122359
ISSUE DATE / TIME: 202202151054
ISSUE DATE / TIME: 202202151054
Unit Type and Rh: 5100
Unit Type and Rh: 5100

## 2020-09-22 LAB — TYPE AND SCREEN
ABO/RH(D): O POS
Antibody Screen: NEGATIVE
Unit division: 0
Unit division: 0

## 2020-09-28 NOTE — Progress Notes (Signed)
Cardiology Office Note:    Date:  09/29/2020   ID:  Tommy Peterson, DOB 12/30/1940, MRN [id number]  PCP:  [facility], [place]  Cardiologist:  [name], MD   Referring MD: [facility], [facility] *   Chief Complaint  Patient presents with  . Follow-up  PAF  History of Present Illness:    Tommy Peterson is a 80 y.o. 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 PAF first diagnosed in 02/2018 in the setting of PNA. PAF RVR resolved. Loop recorder was placed that was largely unremarkable over the next year aside form paroxysmal atrial tachycardia. Downloads were suspended due to cost. He presented back in Jan 2020 with recurrence of symptomatic Afib with RVR. He was started on metoprolol and eliquis for stroke protection. At follow up, he was better rate controlled, but still in the 120s. He gets fatigued when it is time for his blood transfusion. He had COVID-19 infection in Aug/Sept 2021.  He does not have a history of ischemic heart disease, but did have coronary and aortic calcifications on CT in Jan 2018.  Given his borderline rate control and history of chronic anemia, Dr. Sallyanne Kuster was concerned about decompensation and opted to perform TEE-guided DCCV on 08/31/20. He converted to NSR. Moderately dilated left atrium, 2+ TR. Metoprolol was increaesd to 75 mg BID. Unfortunately, he called our office on 09/17/20 reporting HR back in the 120s. Dr. Sallyanne Kuster suspected he may be back in Afib and asked him to be evaluated.   He presents today for follow up. He gets 2U PRBC every two weeks for the past three years. Transfusions generally reduced his fatigue and weakness, but lately have not. Unclear how much his Afib is contributing to his symptoms.  His significant other accompanies him today. He has had a recent dose adjustment in his methotrexate from 10 mg to 12.5 mg. He continues to have diarrhea  and struggles with oral intake. He does not complain of palpitations, dyspnea, CP, lower extremity swelling, or recent falls or syncope. They are hesitant to further increase metoprolol due to marginal pressures at home.    Past Medical History:  Diagnosis Date  . Acute respiratory failure ([place])   . BCC (basal cell carcinoma of skin) 08/15/2016   w/SCC Left Forehead (treatment @ [facility])  . BPH (benign prostatic hyperplasia)   . Carpal tunnel syndrome on both sides   . Chronic diarrhea    intermittant due to chronic collagenous colitis  . Chronic large granular lymphocytic leukemia ([place]) primary hemotologist-  [name]. 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 Collagenous colitis    chronic--- intermittant between diarrahea and constipation  . ED (erectile dysfunction)   . HCAP (healthcare-associated pneumonia)   . History of adenomatous polyp of colon   . History of Clostridium difficile colitis 05/2016  . History of pneumonia 11/08/2017   CAP -- LLL---  12-19-2017  per pt no residual symptoms  . History of SCC (squamous cell carcinoma) of skin   . History of sepsis    11-20-2017 severe sepsis due to UTI;  10/ 2017sepsis due to c-diff colitis  . Leucocytosis    chronic  . Lower urinary tract symptoms (LUTS)   . Macrocytic  anemia    since 02/ 2015  . MGUS (monoclonal gammopathy of unknown significance)    hemotologist-  dr Michelene Heady @   . Monoclonal paraproteinemia   . Neuropathy, peripheral   . Nodular basal cell carcinoma (BCC) 10/01/2018   Left Neck(Nodular) Pt Does Not Want Treatment  . Nodular basal cell carcinoma (BCC) 10/01/2018   Mid Forehead (treament curet and excision)  . OA (osteoarthritis)   . Prostatic stone   . Rash of face    RIGHT  SIDE  . Raynaud's syndrome   . Recurrent BCC (basal cell carcinoma) 03/05/2019   positive margin  . SCC (squamous cell carcinoma) 08/10/2009   Central Forehead (Moh's Dr. Jeanie Sewer @ [facility])  . SCC (squamous cell carcinoma) 12/08/2011   CIS-Left Cheek (treatment Aldara @ [facility])  . SCC (squamous cell carcinoma) 07/09/2015   CIS-Left Forehead (treament @ [facility])  . Septic shock (HCC)    x 3 w/UTI  . Squamous cell carcinoma in situ (SCCIS) 12/08/2011   Left Cheek (treatment Aldara @ [facility])   . Superficial basal cell carcinoma (BCC) 10/01/2018   Left Shin-Pt does not want treatment  . Thin skin    fragile  . Transfusion-dependent anemia since 10/ 2017   last transfusion PRBCs 07-19-2017  per hemologist note dated 10-25-2017  . Type 2 diabetes mellitus treated with insulin (HCC)    followed by dr Selena Batten (pcp)  . Urinary hesitancy   . Wears contact lenses    LEFT EYE ONLY    Past Surgical History:  Procedure Laterality Date  . BONE MARROW BIOPSY Right 05-24-2016;  08-20-2014  . CARDIOVASCULAR STRESS TEST  11-26-2012   dr Boneta Lucks  @ [facility] ([place])   normal nuclear study w/ no ischemia/  normal LV function and wall motion , ef 60% (find in care everywhere,epic)  . CARDIOVERSION N/A 08/31/2020   Procedure: CARDIOVERSION;  Surgeon: Thurmon Fair, MD;  Location: [facility] ENDOSCOPY;  Service: Cardiovascular;  Laterality: N/A;  . CATARACT EXTRACTION W/ INTRAOCULAR LENS  IMPLANT, BILATERAL  2015  . CYSTOSCOPY WITH LITHOLAPAXY N/A 12/25/2017   Procedure: CYSTOSCOPY WITH LITHOLAPAXY AND FULGERATION;  Surgeon: Bjorn Pippin, MD;  Location: [facility];  Service: Urology;  Laterality: N/A;  . INGUINAL HERNIA REPAIR Right 1980s  . LOOP RECORDER INSERTION N/A 04/10/2018   Procedure: LOOP RECORDER INSERTION;  Surgeon: Thurmon Fair, MD;  Location: [facility] INVASIVE CV LAB;  Service: Cardiovascular;  Laterality: N/A;  . PENILE PROSTHESIS IMPLANT  12-02-2015   dr Terie Purser @ [facility] in [place]   COLOPLAST  . SHOULDER SURGERY Left 08/2016  . TEE WITHOUT CARDIOVERSION N/A 08/31/2020   Procedure: TRANSESOPHAGEAL ECHOCARDIOGRAM (TEE);  Surgeon: Thurmon Fair, MD;  Location: [facility] ENDOSCOPY;  Service: Cardiovascular;  Laterality: N/A;  . TONSILLECTOMY  child  . TRANSURETHRAL RESECTION OF PROSTATE  2009   AND REPAIR RECURRENT RIGHT INGUINAL HERNIA    Current Medications: Current Meds  Medication Sig  . apixaban (ELIQUIS) 5 MG TABS tablet Take 1 tablet (5 mg total) by mouth 2 (two) times daily.  Marland Kitchen b complex vitamins capsule Take 1 capsule by mouth every evening.  . budesonide (ENTOCORT EC) 3 MG 24 hr capsule Take 9 mg by mouth daily.  . Cranberry 400 MG TABS Take 400 mg by mouth daily.  . D-Mannose 500 MG CAPS Take 500 mg by mouth in the morning and at bedtime.  . folic acid (FOLVITE) 1 MG tablet TAKE 1 TABLET BY MOUTH  EVERY DAY (Patient taking differently: Take 1 mg by mouth daily.)  . Insulin Glargine-Lixisenatide 100-33 UNT-MCG/ML SOPN Inject 26 Units into the skin at bedtime. Soliqua 100/33  . methotrexate (RHEUMATREX) 2.5 MG tablet Take 5 tablets (12.5 mg total) by mouth once a week. Caution:Chemotherapy. Protect from light.  . metoprolol tartrate (LOPRESSOR) 50 MG tablet Take 50 mg by mouth as directed. Take 2 tablets twice a day of SBP above 105/60  . Misc Natural Products (DAILY HERBS MEMORY PO) Take 1 capsule by mouth daily. (300 MG) [employer]-ps  . Misc Natural Products (PROSTATE HEALTH PO) Take 1 capsule by mouth every evening. Prostate Plus Health Complex  . NOVOLOG FLEXPEN 100 UNIT/ML FlexPen Inject 5 Units into the skin every evening.  Marland Kitchen omeprazole (PRILOSEC) 40 MG capsule Take 40 mg by mouth 2 (two) times daily before a meal.  . oxybutynin (DITROPAN) 5 MG tablet Take 5 mg by mouth every evening.  . pioglitazone (ACTOS) 15 MG tablet Take 15 mg by mouth every morning.   . traMADol (ULTRAM) 50 MG tablet Take 50 mg by mouth at bedtime  as needed (restlessness).  . [DISCONTINUED] amiodarone (PACERONE) 200 MG tablet Take 1 tablet (200 mg total) by mouth daily.     Allergies:   Bee venom, Metformin and related, Sulfamethoxazole-trimethoprim, Bee pollen, and Metformin hcl   Social History   Socioeconomic History  . Marital status: Widowed    Spouse name: engaged  . Number of children: 8  . Years of education: college-2  . Highest education level: Not on file  Occupational History  . Occupation: Press photographer  . 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 Smoking status: Former Smoker    Years: 20.00    Types: Cigarettes    Quit date: 05/04/1996    Years since quitting: 24.4  . Smokeless tobacco: Never Used  Vaping Use  . Vaping Use: Never used  Substance and Sexual Activity  . Alcohol use: Yes    Comment: 1-2 glasses of wine every other day  . Drug use: No  . Sexual activity: Never  Other Topics Concern  . Not on file  Social History Narrative  . Not on file   Social Determinants of Health   Financial Resource Strain: Not on file  Food Insecurity: Not on file  Transportation Needs: Not on file  Physical Activity: Not on file  Stress: Not on file  Social Connections: Not on file     Family History: The patient's family history includes Diabetes in his mother; Heart failure in his mother.  ROS:   Please see the history of present illness.     All other systems reviewed and are negative.  EKGs/Labs/Other Studies Reviewed:    The following studies were reviewed today:  TEE-DCCV 08/31/20 1. Left ventricular ejection fraction, by estimation, is 55 to 60%. The  left ventricle has normal function. The left ventricle has no regional  wall motion abnormalities. Left ventricular diastolic function could not  be evaluated.  2. Right ventricular systolic function is normal. The right ventricular  size is normal. There is normal pulmonary artery systolic pressure.  3. Left atrial size was moderately  dilated. No left atrial/left atrial  appendage thrombus was detected. The LAA emptying velocity was 30 cm/s.  4. Right atrial size was mildly dilated.  5. The mitral valve is normal in structure. Mild mitral valve  regurgitation.  6. Tricuspid valve regurgitation is mild to moderate.  7. The aortic valve is  tricuspid. Aortic valve regurgitation is not  visualized. No aortic stenosis is present.  8. There is Moderate (Grade III) protruding plaque involving the  descending aorta.   EKG:  EKG is  ordered today.  The ekg ordered today demonstrates atrial fibrillation with ventricular rate 102  Recent Labs: 04/10/2020: Magnesium 2.3 09/21/2020: ALT 50; BUN 11; Creatinine 0.72; Hemoglobin 7.1; Platelets 354; Potassium 3.3; Sodium 136  Recent Lipid Panel    Component Value Date/Time   CHOL 111 05/05/2016 0402   TRIG 111 04/07/2020 0055   HDL 27 (L) 05/05/2016 0402   CHOLHDL 4.1 05/05/2016 0402   VLDL 21 05/05/2016 0402   LDLCALC 63 05/05/2016 0402    Physical Exam:    VS:  BP 120/66   Pulse (!) 102   Ht $R'5\' 10"'up$  (1.778 m)   Wt 162 lb 12.8 oz (73.8 kg)   SpO2 98%   BMI 23.36 kg/m     Wt Readings from Last 3 Encounters:  09/29/20 162 lb 12.8 oz (73.8 kg)  09/21/20 162 lb 6.4 oz (73.7 kg)  08/31/20 162 lb (73.5 kg)     GEN: elderly male, thin, in no acute distress HEENT: Normal NECK: No JVD; No carotid bruits LYMPHATICS: No lymphadenopathy CARDIAC: RRR, no murmurs, rubs, gallops RESPIRATORY:  Clear to auscultation without rales, wheezing or rhonchi  ABDOMEN: Soft, non-tender, non-distended MUSCULOSKELETAL:  No edema; No deformity  SKIN: Warm and dry NEUROLOGIC:  Alert and oriented x 3 PSYCHIATRIC:  Normal affect   ASSESSMENT:    1. Paroxysmal atrial fibrillation (HCC)   2. Chronic anticoagulation   3. Aortic atherosclerosis ([place])   4. Type 2 diabetes mellitus without complication, without long-term current use of insulin (HCC)    PLAN:    In order of problems  listed above:  PAF - s/p TEE-DCCV 08/31/20 - unfortunately, he has reverted back to Afib - rate 102 today - his SO is concerned about low BP and they are leery of increasing his metoprolol - we discussed amiodarone and possible repeat DCCV after amio load if he doesn't convert - he is on methotrexate - confirmed with [facility] that his dose may need to be reduced or skipped in the setting of amiodarone load - I will message Dr. Irene Limbo. - start 400 mg BID x 7 days, then 200 mg BID x 7 days, then 200 mg daily - I would like him to have close follow up in the next 7-10 days - if he happens to convert, will likely need to reduce or hold BB as his prior HR was in the 50s   Chronic anticoagulation - This patients CHA2DS2-VASc Score and unadjusted Ischemic Stroke Rate (% per year) is equal to 3.2 % stroke rate/year from a score of 3 (2age, DM) - no bleeding problems, no recent falls   Aortic and coronary atherosclerosis - appreciated on CT - risk factor management - no angina   DM - A1c 7.5%    Medication Adjustments/Labs and Tests Ordered: Current medicines are reviewed at length with the patient today.  Concerns regarding medicines are outlined above.  Orders Placed This Encounter  Procedures  . EKG 12-Lead   Meds ordered this encounter  Medications  . DISCONTD: amiodarone (PACERONE) 200 MG tablet    Sig: Take 1 tablet (200 mg total) by mouth daily.    Dispense:  90 tablet    Refill:  3  . amiodarone (PACERONE) 200 MG tablet    Sig: Take 1 tablet (200 mg total)  by mouth daily.    Dispense:  90 tablet    Refill:  3    Signed, Ledora Bottcher, [place]  09/29/2020 1:13 PM    [facility]

## 2020-09-29 ENCOUNTER — Other Ambulatory Visit: Payer: Self-pay

## 2020-09-29 ENCOUNTER — Encounter: Payer: Self-pay | Admitting: Physician Assistant

## 2020-09-29 ENCOUNTER — Ambulatory Visit: Payer: Medicare HMO | Admitting: Physician Assistant

## 2020-09-29 VITALS — BP 120/66 | HR 102 | Ht 70.0 in | Wt 162.8 lb

## 2020-09-29 DIAGNOSIS — Z7901 Long term (current) use of anticoagulants: Secondary | ICD-10-CM

## 2020-09-29 DIAGNOSIS — E119 Type 2 diabetes mellitus without complications: Secondary | ICD-10-CM | POA: Diagnosis not present

## 2020-09-29 DIAGNOSIS — I700 Atherosclerosis of aorta: Secondary | ICD-10-CM | POA: Diagnosis not present

## 2020-09-29 DIAGNOSIS — I480 Paroxysmal atrial fibrillation: Secondary | ICD-10-CM

## 2020-09-29 MED ORDER — AMIODARONE HCL 200 MG PO TABS: 200.0000 mg | ORAL_TABLET | Freq: Every day | ORAL | 3 refills | Status: AC

## 2020-09-29 MED ORDER — AMIODARONE HCL 200 MG PO TABS: 200.0000 mg | ORAL_TABLET | Freq: Every day | ORAL | 3 refills | Status: DC

## 2020-09-29 NOTE — Patient Instructions (Signed)
Medication Instructions:  Start Amiodarone 200 Mg (Take 400 mg Twice Daily for Seven (7) Days. Take 200 Mg Twice Daily for Seven (7) Days. Take 200 mg Daily. *If you need a refill on your cardiac medications before your next appointment, please call your pharmacy*   Lab Work: No labs If you have labs (blood work) drawn today and your tests are completely normal, you will receive your results only by: Marland Kitchen MyChart Message (if you have MyChart) OR . A paper copy in the mail If you have any lab test that is abnormal or we need to change your treatment, we will call you to review the results.   Testing/Procedures: No Testing   Follow-Up: At [facility], you and your health needs are our priority.  As part of our continuing mission to provide you with exceptional heart care, we have created designated Provider Care Teams.  These Care Teams include your primary Cardiologist (physician) and Advanced Practice Providers (APPs -  Physician Assistants and Nurse Practitioners) who all work together to provide you with the care you need, when you need it.    Your next appointment:   1-2 weeks   The format for your next appointment:   In Person  Provider:   [name], MD

## 2020-10-06 ENCOUNTER — Other Ambulatory Visit: Payer: Self-pay | Admitting: *Deleted

## 2020-10-06 ENCOUNTER — Other Ambulatory Visit: Payer: Self-pay

## 2020-10-06 ENCOUNTER — Inpatient Hospital Stay: Payer: Medicare HMO

## 2020-10-06 ENCOUNTER — Inpatient Hospital Stay: Payer: Medicare HMO | Attending: Hematology

## 2020-10-06 DIAGNOSIS — C91Z0 Other lymphoid leukemia not having achieved remission: Secondary | ICD-10-CM | POA: Insufficient documentation

## 2020-10-06 DIAGNOSIS — D649 Anemia, unspecified: Secondary | ICD-10-CM

## 2020-10-06 LAB — CBC WITH DIFFERENTIAL/PLATELET
Abs Immature Granulocytes: 0.16 10*3/uL — ABNORMAL HIGH (ref 0.00–0.07)
Basophils Absolute: 0 10*3/uL (ref 0.0–0.1)
Basophils Relative: 0 %
Eosinophils Absolute: 0.1 10*3/uL (ref 0.0–0.5)
Eosinophils Relative: 0 %
HCT: 22.6 % — ABNORMAL LOW (ref 39.0–52.0)
Hemoglobin: 7.4 g/dL — ABNORMAL LOW (ref 13.0–17.0)
Immature Granulocytes: 1 %
Lymphocytes Relative: 37 %
Lymphs Abs: 5 10*3/uL — ABNORMAL HIGH (ref 0.7–4.0)
MCH: 28.8 pg (ref 26.0–34.0)
MCHC: 32.7 g/dL (ref 30.0–36.0)
MCV: 87.9 fL (ref 80.0–100.0)
Monocytes Absolute: 1.3 10*3/uL — ABNORMAL HIGH (ref 0.1–1.0)
Monocytes Relative: 10 %
Neutro Abs: 7.1 10*3/uL (ref 1.7–7.7)
Neutrophils Relative %: 52 %
Platelets: 387 10*3/uL (ref 150–400)
RBC: 2.57 MIL/uL — ABNORMAL LOW (ref 4.22–5.81)
RDW: 15.8 % — ABNORMAL HIGH (ref 11.5–15.5)
WBC: 13.6 10*3/uL — ABNORMAL HIGH (ref 4.0–10.5)
nRBC: 0 % (ref 0.0–0.2)

## 2020-10-06 LAB — SAMPLE TO BLOOD BANK

## 2020-10-06 LAB — CMP (CANCER CENTER ONLY)
ALT: 31 U/L (ref 0–44)
AST: 15 U/L (ref 15–41)
Albumin: 3.2 g/dL — ABNORMAL LOW (ref 3.5–5.0)
Alkaline Phosphatase: 83 U/L (ref 38–126)
Anion gap: 8 (ref 5–15)
BUN: 10 mg/dL (ref 8–23)
CO2: 25 mmol/L (ref 22–32)
Calcium: 8.6 mg/dL — ABNORMAL LOW (ref 8.9–10.3)
Chloride: 103 mmol/L (ref 98–111)
Creatinine: 0.63 mg/dL (ref 0.61–1.24)
GFR, Estimated: >60 mL/min (ref >60)
Glucose, Bld: 170 mg/dL — ABNORMAL HIGH (ref 70–99)
Potassium: 4 mmol/L (ref 3.5–5.1)
Sodium: 136 mmol/L (ref 135–145)
Total Bilirubin: 1 mg/dL (ref 0.3–1.2)
Total Protein: 6.4 g/dL — ABNORMAL LOW (ref 6.5–8.1)

## 2020-10-06 LAB — PREPARE RBC (CROSSMATCH)

## 2020-10-06 MED ORDER — METHYLPREDNISOLONE SODIUM SUCC 125 MG IJ SOLR
INTRAMUSCULAR | Status: AC
  Filled 2020-10-06: qty 2

## 2020-10-06 MED ORDER — ACETAMINOPHEN 325 MG PO TABS
ORAL_TABLET | ORAL | Status: AC
  Filled 2020-10-06: qty 2

## 2020-10-06 MED ORDER — METHYLPREDNISOLONE SODIUM SUCC 125 MG IJ SOLR
60.0000 mg | Freq: Once | INTRAMUSCULAR | Status: AC
  Administered 2020-10-06: 60 mg via INTRAVENOUS

## 2020-10-06 MED ORDER — SODIUM CHLORIDE 0.9% IV SOLUTION
250.0000 mL | Freq: Once | INTRAVENOUS | Status: AC
  Administered 2020-10-06: 250 mL via INTRAVENOUS
  Filled 2020-10-06: qty 250

## 2020-10-06 MED ORDER — ACETAMINOPHEN 325 MG PO TABS
650.0000 mg | ORAL_TABLET | Freq: Once | ORAL | Status: AC
  Administered 2020-10-06: 650 mg via ORAL

## 2020-10-06 NOTE — Patient Instructions (Signed)

## 2020-10-07 LAB — TYPE AND SCREEN
ABO/RH(D): O POS
Antibody Screen: NEGATIVE
Unit division: 0

## 2020-10-07 LAB — BPAM RBC
Blood Product Expiration Date: 202203312359
ISSUE DATE / TIME: 202203021218
Unit Type and Rh: 5100

## 2020-10-11 ENCOUNTER — Ambulatory Visit: Payer: Medicare HMO | Admitting: Cardiology

## 2020-10-11 ENCOUNTER — Encounter: Payer: Self-pay | Admitting: Cardiology

## 2020-10-11 ENCOUNTER — Other Ambulatory Visit: Payer: Self-pay

## 2020-10-11 VITALS — BP 120/70 | HR 119 | Ht 70.0 in | Wt 161.4 lb

## 2020-10-11 DIAGNOSIS — I480 Paroxysmal atrial fibrillation: Secondary | ICD-10-CM | POA: Diagnosis not present

## 2020-10-11 DIAGNOSIS — I700 Atherosclerosis of aorta: Secondary | ICD-10-CM

## 2020-10-11 DIAGNOSIS — E118 Type 2 diabetes mellitus with unspecified complications: Secondary | ICD-10-CM

## 2020-10-11 DIAGNOSIS — U071 COVID-19: Secondary | ICD-10-CM

## 2020-10-11 DIAGNOSIS — I10 Essential (primary) hypertension: Secondary | ICD-10-CM | POA: Diagnosis not present

## 2020-10-11 DIAGNOSIS — C91Z0 Other lymphoid leukemia not having achieved remission: Secondary | ICD-10-CM

## 2020-10-11 DIAGNOSIS — J9601 Acute respiratory failure with hypoxia: Secondary | ICD-10-CM | POA: Diagnosis not present

## 2020-10-11 DIAGNOSIS — Z7901 Long term (current) use of anticoagulants: Secondary | ICD-10-CM | POA: Diagnosis not present

## 2020-10-11 NOTE — Progress Notes (Signed)
Cardiology Office Note:    Date:  10/11/2020   ID:  [name], DOB Feb 10, 1941, MRN [id number]  PCP:  [facility] [place]  Cardiologist:  [name], MD  Electrophysiologist:  None   Referring MD: [facility], [facility] [place] *   CC: "weak"  History of Present Illness:    CAILLOU MINUS is a 80 y.o. male with a hx of leukemia followed by Dr. Irene Limbo, he requires periodic transfusions for anemia, diabetes, hypertension, and PAF.  Recently he has had recurrent PAF.  He underwent outpatient cardioversion 125 2022 but was back in atrial fibrillation February 11th 2022.  He was seen in the office 223 2022 and put on amiodarone with plans for repeat cardioversion.  He presents to the office today for follow-up.  He complains of being profoundly weak.  His hemoglobin on March 2 was 7.4, he received 1 unit.  He is due for another transfusion March 16.  In the office he remains in atrial fibrillation with a ventricular response of 100.  Unfortunately he also tells me he missed his Eliquis 2 days ago.  I explained that we will have to reset his cardioversion.  He will need to be seen in 3 weeks.  At that time if he remains in atrial fibrillation we can set him up for an outpatient cardioversion if he has not missed any Eliquis doses.  I will also give more time for his amiodarone to load.  Past Medical History:  Diagnosis Date  . Acute respiratory failure ([place])   . BCC (basal cell carcinoma of skin) 08/15/2016   w/SCC Left Forehead (treatment @ [facility])  . BPH (benign prostatic hyperplasia)   . Carpal tunnel syndrome on both sides   . Chronic diarrhea    intermittant due to chronic collagenous colitis  . 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 Collagenous colitis    chronic--- intermittant between diarrahea and constipation  . ED (erectile dysfunction)   . HCAP (healthcare-associated pneumonia)   . History of adenomatous polyp of colon   . History of Clostridium difficile colitis 05/2016  . History of pneumonia 11/08/2017   CAP -- LLL---  12-19-2017  per pt no residual symptoms  . History of SCC (squamous cell carcinoma) of skin   . History of sepsis    11-20-2017 severe sepsis due to UTI;  10/ 2017sepsis due to c-diff colitis  . Leucocytosis    chronic  . Lower urinary tract symptoms (LUTS)   . Macrocytic anemia    since 02/ 2015  . MGUS (monoclonal gammopathy of unknown significance)    hemotologist-  [name] @ [facility]  . Monoclonal paraproteinemia   . Neuropathy, peripheral   . Nodular basal cell carcinoma (BCC) 10/01/2018   Left Neck(Nodular) Pt Does Not Want Treatment  . Nodular basal cell carcinoma (BCC) 10/01/2018   Mid Forehead (treament curet and excision)  . OA (osteoarthritis)   . Prostatic stone   . Rash of face    RIGHT SIDE  . Raynaud's syndrome   . Recurrent BCC (basal cell carcinoma) 03/05/2019   positive margin  . SCC (squamous cell carcinoma) 08/10/2009   Central Forehead (Moh's Dr. Rhona Raider @ [facility])  . SCC (squamous cell  carcinoma) 12/08/2011   CIS-Left Cheek (treatment Aldara @ [facility])  . SCC (squamous cell carcinoma) 07/09/2015   CIS-Left Forehead (treament @ [facility])  . Septic shock (HCC)    x 3 w/UTI  . Squamous cell carcinoma in situ (SCCIS) 12/08/2011   Left Cheek (treatment Aldara @ [facility])   . Superficial basal cell carcinoma (BCC) 10/01/2018   Left Shin-Pt does not want treatment  . Thin skin    fragile  . Transfusion-dependent anemia since 10/ 2017   last transfusion PRBCs 07-19-2017  per hemologist note dated 10-25-2017  . Type 2 diabetes mellitus treated with insulin ([place])     followed by [name] (pcp)  . Urinary hesitancy   . Wears contact lenses    LEFT EYE ONLY    Past Surgical History:  Procedure Laterality Date  . BONE MARROW BIOPSY Right 05-24-2016;  08-20-2014  . CARDIOVASCULAR STRESS TEST  11-26-2012   [name]  @ [facility] ([place]   normal nuclear study w/ no ischemia/  normal LV function and wall motion , ef 60% (find in care everywhere,epic)  . CARDIOVERSION N/A 08/31/2020   Procedure: CARDIOVERSION;  Surgeon: [name], MD;  Location: [place];  Service: Cardiovascular;  Laterality: N/A;  . CATARACT EXTRACTION W/ INTRAOCULAR LENS  IMPLANT, BILATERAL  2015  . CYSTOSCOPY WITH LITHOLAPAXY N/A 12/25/2017   Procedure: CYSTOSCOPY WITH LITHOLAPAXY AND FULGERATION;  Surgeon: [name], MD;  Location: [facility];  Service: Urology;  Laterality: N/A;  . INGUINAL HERNIA REPAIR Right 1980s  . LOOP RECORDER INSERTION N/A 04/10/2018   Procedure: LOOP RECORDER INSERTION;  Surgeon: [name], MD;  Location: [place] [facility];  Service: Cardiovascular;  Laterality: N/A;  . PENILE PROSTHESIS IMPLANT  12-02-2015   dr [name] @ [facility] in [place]  . SHOULDER SURGERY Left 08/2016  . TEE WITHOUT CARDIOVERSION N/A 08/31/2020   Procedure: TRANSESOPHAGEAL ECHOCARDIOGRAM (TEE);  Surgeon: [name], MD;  Location: [place];  Service: Cardiovascular;  Laterality: N/A;  . TONSILLECTOMY  child  . TRANSURETHRAL RESECTION OF PROSTATE  2009   AND REPAIR RECURRENT RIGHT INGUINAL HERNIA    Current Medications: Current Meds  Medication Sig  . amiodarone (PACERONE) 200 MG tablet Take 1 tablet (200 mg total) by mouth daily.  Marland Kitchen apixaban (ELIQUIS) 5 MG TABS tablet Take 1 tablet (5 mg total) by mouth 2 (two) times daily.  Marland Kitchen b complex vitamins capsule Take 1 capsule by mouth every evening.  . budesonide (ENTOCORT EC) 3 MG 24 hr capsule Take 9 mg by mouth daily.  . Cranberry 400 MG TABS Take 400 mg by mouth daily.   . D-Mannose 500 MG CAPS Take 500 mg by mouth in the morning and at bedtime.  . folic acid (FOLVITE) 1 MG tablet TAKE 1 TABLET BY MOUTH EVERY DAY (Patient taking differently: Take 1 mg by mouth daily.)  . Insulin Glargine-Lixisenatide 100-33 UNT-MCG/ML SOPN Inject 26 Units into the skin at bedtime. Soliqua 100/33  . metoprolol tartrate (LOPRESSOR) 50 MG tablet Take 50 mg by mouth as directed. Take 2 tablets twice a day of SBP above 105/60  . Misc Natural Products (DAILY HERBS MEMORY PO) Take 1 capsule by mouth daily. (300 MG) [employer]-ps  . Misc Natural Products (PROSTATE HEALTH PO) Take 1 capsule by mouth every evening. Prostate Plus Health Complex  . NOVOLOG FLEXPEN 100 UNIT/ML FlexPen Inject 5 Units into the skin every evening.  Marland Kitchen omeprazole (  PRILOSEC) 40 MG capsule Take 40 mg by mouth 2 (two) times daily before a meal.  . oxybutynin (DITROPAN) 5 MG tablet Take 5 mg by mouth every evening.  . pioglitazone (ACTOS) 15 MG tablet Take 15 mg by mouth every morning.   . traMADol (ULTRAM) 50 MG tablet Take 50 mg by mouth at bedtime as needed (restlessness).  . [DISCONTINUED] methotrexate (RHEUMATREX) 2.5 MG tablet Take 5 tablets (12.5 mg total) by mouth once a week. Caution:Chemotherapy. Protect from light.     Allergies:   Bee venom, Metformin and related, Sulfamethoxazole-trimethoprim, Bee pollen, and Metformin hcl   Social History   Socioeconomic History  . Marital status: Widowed    Spouse name: engaged  . Number of children: 8  . Years of education: college-2  . Highest education level: Not on file  Occupational History  . Occupation: Press photographer  . 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 Smoking status: Former Smoker    Years: 20.00    Types: Cigarettes    Quit date: 05/04/1996    Years since quitting: 24.4  . Smokeless tobacco: Never Used  Vaping Use  . Vaping Use: Never used  Substance and Sexual Activity  . Alcohol use: Yes    Comment: 1-2 glasses of wine  every other day  . Drug use: No  . Sexual activity: Never  Other Topics Concern  . Not on file  Social History Narrative  . Not on file   Social Determinants of Health   Financial Resource Strain: Not on file  Food Insecurity: Not on file  Transportation Needs: Not on file  Physical Activity: Not on file  Stress: Not on file  Social Connections: Not on file     Family History: The patient's family history includes Diabetes in his mother; Heart failure in his mother.  ROS:   Please see the history of present illness.    He has a penile prosthesis that's "out of place".  He sees a urologist at [facility] for this.  All other systems reviewed and are negative.  EKGs/Labs/Other Studies Reviewed:    The following studies were reviewed today:   EKG:  EKG is ordered today.  The ekg ordered today demonstrates AF with VR 108  Recent Labs: 04/10/2020: Magnesium 2.3 10/06/2020: ALT 31; BUN 10; Creatinine 0.63; Hemoglobin 7.4; Platelets 387; Potassium 4.0; Sodium 136  Recent Lipid Panel    Component Value Date/Time   CHOL 111 05/05/2016 0402   TRIG 111 04/07/2020 0055   HDL 27 (L) 05/05/2016 0402   CHOLHDL 4.1 05/05/2016 0402   VLDL 21 05/05/2016 0402   LDLCALC 63 05/05/2016 0402    Physical Exam:    VS:  BP 120/70   Pulse (!) 119   Ht 5' 10" (1.778 m)   Wt 161 lb 6.4 oz (73.2 kg)   SpO2 98%   BMI 23.16 kg/m     Wt Readings from Last 3 Encounters:  10/11/20 161 lb 6.4 oz (73.2 kg)  10/06/20 164 lb 4 oz (74.5 kg)  09/29/20 162 lb 12.8 oz (73.8 kg)     GEN: Thin, pale, male, in no acute distress HEENT: Normal NECK: No JVD; No carotid bruits CARDIAC: irregularly irregular, no murmurs, rubs, gallops RESPIRATORY:  Faint dry crackles ABDOMEN: Soft,  non-distended MUSCULOSKELETAL:  No edema; No deformity- areas of ecchymosis  SKIN: Warm and dry NEUROLOGIC:  Alert and oriented x 3 PSYCHIATRIC:  Normal affect   ASSESSMENT:    Paroxysmal atrial fibrillation (  [place]) DCCV  08/31/2020-recurrent PAF 09/17/2020- Amiodarone added  Chronic anticoagulation [name] Vasc-3, on Eliquis- he missed two doses two days ago  Large granular lymphocytic leukemia (HCC) Chronic anemia requiring transfusions Q 2 wks Dr Irene Limbo has taken him off Methotrexate since Amiodarone started  PLAN:    Continue Amiodarone and Eliquis- OV 3 weeks and plan OP DCCV if he hasn't missed any Eliquis doses.    Medication Adjustments/Labs and Tests Ordered: Current medicines are reviewed at length with the patient today.  Concerns regarding medicines are outlined above.  Orders Placed This Encounter  Procedures  . EKG 12-Lead   No orders of the defined types were placed in this encounter.   Patient Instructions  Medication Instructions:  Continue current medication  *If you need a refill on your cardiac medications before your next appointment, please call your pharmacy*   Lab Work: None Ordered   Testing/Procedures: None Ordered   Follow-Up: At [facility], you and your health needs are our priority.  As part of our continuing mission to provide you with exceptional heart care, we have created designated Provider Care Teams.  These Care Teams include your primary Cardiologist (physician) and Advanced Practice Providers (APPs -  Physician Assistants and Nurse Practitioners) who all work together to provide you with the care you need, when you need it.  We recommend signing up for the patient portal called "MyChart".  Sign up information is provided on this After Visit Summary.  MyChart is used to connect with patients for Virtual Visits (Telemedicine).  Patients are able to view lab/test results, encounter notes, upcoming appointments, etc.  Non-urgent messages can be sent to your provider as well.   To learn more about what you can do with MyChart, go to [email or web address].    Your next appointment:   Monday March 28th @ 2:15 PM  The format for your next appointment:   In  Person  Provider:   You will see one of the following Advanced Practice Providers on your designated Care Team:     Fabian Sharp, PA-C or          Signed, Susy [place]  10/11/2020 9:39 AM    [place]

## 2020-10-11 NOTE — Assessment & Plan Note (Signed)
[name] Vasc-3, on Eliquis- he missed two doses two days ago

## 2020-10-11 NOTE — Assessment & Plan Note (Signed)
Chronic anemia requiring transfusions Q 2 wks Dr Irene Limbo has taken him off Methotrexate since Amiodarone started

## 2020-10-11 NOTE — Assessment & Plan Note (Signed)
DCCV 08/31/2020-recurrent PAF 09/17/2020- Amiodarone added

## 2020-10-11 NOTE — Patient Instructions (Addendum)
Medication Instructions:  Continue current medication  *If you need a refill on your cardiac medications before your next appointment, please call your pharmacy*   Lab Work: None Ordered   Testing/Procedures: None Ordered   Follow-Up: At [facility], you and your health needs are our priority.  As part of our continuing mission to provide you with exceptional heart care, we have created designated Provider Care Teams.  These Care Teams include your primary Cardiologist (physician) and Advanced Practice Providers (APPs -  Physician Assistants and Nurse Practitioners) who all work together to provide you with the care you need, when you need it.  We recommend signing up for the patient portal called "MyChart".  Sign up information is provided on this After Visit Summary.  MyChart is used to connect with patients for Virtual Visits (Telemedicine).  Patients are able to view lab/test results, encounter notes, upcoming appointments, etc.  Non-urgent messages can be sent to your provider as well.   To learn more about what you can do with MyChart, go to [email or web address].    Your next appointment:   Monday March 28th @ 2:15 PM  The format for your next appointment:   In Person  Provider:   You will see one of the following Advanced Practice Providers on your designated Care Team:     Fabian Sharp, [place] or

## 2020-10-14 DIAGNOSIS — G4700 Insomnia, unspecified: Secondary | ICD-10-CM | POA: Diagnosis not present

## 2020-10-14 DIAGNOSIS — I951 Orthostatic hypotension: Secondary | ICD-10-CM | POA: Diagnosis not present

## 2020-10-14 DIAGNOSIS — K219 Gastro-esophageal reflux disease without esophagitis: Secondary | ICD-10-CM | POA: Diagnosis not present

## 2020-10-14 DIAGNOSIS — D6869 Other thrombophilia: Secondary | ICD-10-CM | POA: Diagnosis not present

## 2020-10-14 DIAGNOSIS — I4891 Unspecified atrial fibrillation: Secondary | ICD-10-CM | POA: Diagnosis not present

## 2020-10-14 DIAGNOSIS — C9590 Leukemia, unspecified not having achieved remission: Secondary | ICD-10-CM | POA: Diagnosis not present

## 2020-10-14 DIAGNOSIS — E1142 Type 2 diabetes mellitus with diabetic polyneuropathy: Secondary | ICD-10-CM | POA: Diagnosis not present

## 2020-10-14 DIAGNOSIS — Z794 Long term (current) use of insulin: Secondary | ICD-10-CM | POA: Diagnosis not present

## 2020-10-14 DIAGNOSIS — E1165 Type 2 diabetes mellitus with hyperglycemia: Secondary | ICD-10-CM | POA: Diagnosis not present

## 2020-10-14 DIAGNOSIS — N3281 Overactive bladder: Secondary | ICD-10-CM | POA: Diagnosis not present

## 2020-10-19 NOTE — Progress Notes (Incomplete)
HEMATOLOGY/ONCOLOGY CLINIC NOTE  Date of Service: 10/19/2020  Patient Care Team: Associates, Saline as PCP - General (Rheumatology) Croitoru, Dani Gobble, MD as PCP - Cardiology (Cardiology) Collier Bullock, MD as Referring Physician (Hematology and Oncology) [name], DO as Referring Physician (Family Medicine)  CHIEF COMPLAINTS/PURPOSE OF CONSULTATION:  Large Granular Lymphocytic Leukemia  Hem/Onc History  1)- chronic leukocytosis (neutrophilia and absolute lymphocytosis), peripheral blood lymphocytes about 5000, about 1/4 of cells have LGL morphology 2)- macrocytic anemia at least since 09/08/2013 (normal CBC on 11/28/2012), MCV now as high as 115, low retics, marrow M:E ratio 15:1 in 05/2016, erythroid hypoplasia 3)- MGUS serum IgG lambda 0.4 g/dL in 07/2014, follow annually 4)- 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 weekly -03/29/2017 increase dose 25 mg po qweek -07/19/2017 increase mtx 30 mg po qweek, stable dose since then   HISTORY OF PRESENTING ILLNESS:  Tommy Peterson is a wonderful 80 y.o. male who has been referred to [place] by Dr [name] for evaluation and management of Large Granular Lymphocytic Leukemia. The pt reports that he is doing well overall.  Pt is here as he is moving his care because his previous Oncologist, Dr. [name], has changed practices. The pt reports that he needed 2 pints of blood every 2 weeks for a year beginning in 2014. Pt has never needed to any  erythropoeitin injections. His Hgb has been holding higher lately, around 10-11. It has been about 7 months since pt has needed his last blood transfusion. Pt has been taking 30 mg of Methotrexate once per week and denies any issues with that dosage. He has continued taking 1 mg Folic Acid once per day. He is also taking a daily multivitamin.   Pt was previously walking a lot but had to slow down due to fatigue. Pt will need a surgery to repair his left inguinal hernia.   In 2017 pt had a C. Diff infection. Pt had recurrent UTI infections in 2019. His last hospitalization for infections was in January 2020. There were no kidney stones or other reasons for repeat infections. Pt denies any pneumonia infections. Pt has had Diabetes for 40+ years and it is currently well controlled. Pt was never given a direct reason for chronic diarrhea but was placed on Budesonide which stopped his symptoms. Pt is currently taking 3 mg twice per week. He notes that he has been experiencing some urinary frequency, but does feel like he is completely emptying his bladder when he uses the restroom. Pt has seen a Urologist who gave him some medication that caused him to urinate up to 30 times per day. He has tried Flomax previously, and still has some at home, but notes that it does not help much. Pt is following with a Dermatologist , Dr. Rhona Raider, for his skin Squamous Cell Carcinoma. He is currently using Efudex to treat. He was having some night sweats about a year ago, but has had none recently.   Most recent lab results (08/28/2019) of CBC w/diff and CMP is as follows:  all values are WNL except for Hgb at 11.8, HCT at 35.1, MCV at 122, MCH at 41.1, RDW at 2.87, RDW at 16.0, Abs Immature Granulocyte at 0.11K, Immature Granulocyte Rel at 1.2, Mono Abs at 1.0K, Creatinine at 0.5, Glucose at 199.  On review of systems, pt reports constipation, fatigue and denies diarrhea, unexpected weight loss, fevers, chills, night  sweats, abdominal pain, leg swelling and any other symptoms.   On PMHx the pt reports BPH, Chronic Diarrhea, Chronic Large Granular Lymphocytic Leukemia, Collagenous Collitis, Clostridium defficile colitis, Chronic UTIs, Transfusion-dependant anemia, Type 2 diabetes mellitus.  INTERVAL HISTORY:  Tommy Peterson is a wonderful 80 y.o. male who is here for evaluation and management of Large Granular Lymphocytic Leukemia. The patient's last visit with [place] was on 09/21/2020. The pt reports that he is doing well overall. We are joined today by his wife.  The pt reports ***  Lab results today 10/20/2020 of CBC w/diff and CMP is as follows: all values are WNL except for ***  On review of systems, pt reports *** and denies *** and any other symptoms.  SURGICAL HISTORY: Past Surgical History:  Procedure Laterality Date  . BONE MARROW BIOPSY Right 05-24-2016;  08-20-2014  . CARDIOVASCULAR STRESS TEST  11-26-2012   [name]  @ [facility] ([place]   normal nuclear study w/ no ischemia/  normal LV function and wall motion , ef 60% (find in care everywhere,epic)  . CARDIOVERSION N/A 08/31/2020   Procedure: CARDIOVERSION;  Surgeon: [name], MD;  Location: [facility];  Service: Cardiovascular;  Laterality: N/A;  . CATARACT EXTRACTION W/ INTRAOCULAR LENS  IMPLANT, BILATERAL  2015  . CYSTOSCOPY WITH LITHOLAPAXY N/A 12/25/2017   Procedure: CYSTOSCOPY WITH LITHOLAPAXY AND FULGERATION;  Surgeon: [name], MD;  Location: [facility];  Service: Urology;  Laterality: N/A;  . INGUINAL HERNIA REPAIR Right 1980s  . LOOP RECORDER INSERTION N/A 04/10/2018   Procedure: LOOP RECORDER INSERTION;  Surgeon: [name], MD;  Location: [facility];  Service: Cardiovascular;  Laterality: N/A;  . PENILE PROSTHESIS IMPLANT  12-02-2015   dr [name] @ [facility] in [place]  . SHOULDER SURGERY Left 08/2016  . TEE WITHOUT CARDIOVERSION N/A 08/31/2020   Procedure:  TRANSESOPHAGEAL ECHOCARDIOGRAM (TEE);  Surgeon: [name], MD;  Location: [facility];  Service: Cardiovascular;  Laterality: N/A;  . TONSILLECTOMY  child  . TRANSURETHRAL RESECTION OF PROSTATE  2009   AND REPAIR RECURRENT RIGHT INGUINAL HERNIA    SOCIAL HISTORY: Social History   Socioeconomic History  . Marital status: Widowed    Spouse name: engaged  . Number of children: 8  . Years of education: college-2  . Highest education level: Not on file  Occupational History  . Occupation: Press photographer  . 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 Smoking status: Former Smoker    Years: 20.00    Types: Cigarettes    Quit date: 05/04/1996    Years since quitting: 24.4  . Smokeless tobacco: Never Used  Vaping Use  . Vaping Use: Never used  Substance and Sexual Activity  . Alcohol use: Yes    Comment: 1-2 glasses of wine every other day  . Drug use: No  . Sexual activity: Never  Other Topics Concern  . Not on file  Social History Narrative  . Not on file   Social Determinants of Health   Financial Resource Strain: Not on file  Food Insecurity: Not on file  Transportation Needs: Not on file  Physical Activity: Not on file  Stress: Not on file  Social Connections: Not on file  Intimate Partner Violence: Not on file    FAMILY HISTORY: Family History  Problem Relation Age of Onset  . Diabetes Mother   . Heart failure Mother     ALLERGIES:  is allergic to bee venom, metformin and related, sulfamethoxazole-trimethoprim, bee pollen, and metformin hcl.  MEDICATIONS:  Current Outpatient Medications  Medication Sig Dispense Refill  . amiodarone (PACERONE) 200 MG tablet Take 1 tablet (200 mg total) by mouth daily. 90 tablet 3  . apixaban (ELIQUIS) 5 MG TABS tablet Take 1 tablet (5 mg total) by mouth 2 (two) times daily. 60 tablet 11  . b complex vitamins capsule Take 1 capsule by mouth every evening.    . BD PEN NEEDLE NANO 2ND GEN 32G X 4 MM MISC     . budesonide  (ENTOCORT EC) 3 MG 24 hr capsule Take 9 mg by mouth daily.    . Cranberry 400 MG TABS Take 400 mg by mouth daily.    . D-Mannose 500 MG CAPS Take 500 mg by mouth in the morning and at bedtime.    . folic acid (FOLVITE) 1 MG tablet TAKE 1 TABLET BY MOUTH EVERY DAY (Patient taking differently: Take 1 mg by mouth daily.) 90 tablet 3  . Insulin Glargine-Lixisenatide 100-33 UNT-MCG/ML SOPN Inject 26 Units into the skin at bedtime. Soliqua 100/33    . metoprolol tartrate (LOPRESSOR) 50 MG tablet Take 50 mg by mouth as directed. Take 2 tablets twice a day of SBP above 105/60    . Misc Natural Products (DAILY HERBS MEMORY PO) Take 1 capsule by mouth daily. (300 MG) [employer] Neuro-ps    . Misc Natural Products (PROSTATE HEALTH PO) Take 1 capsule by mouth every evening. Prostate Plus Health Complex    . NOVOLOG FLEXPEN 100 UNIT/ML FlexPen Inject 5 Units into the skin every evening.    Marland Kitchen omeprazole (PRILOSEC) 40 MG capsule Take 40 mg by mouth 2 (two) times daily before a meal.    . oxybutynin (DITROPAN) 5 MG tablet Take 5 mg by mouth every evening.    . pioglitazone (ACTOS) 15 MG tablet Take 15 mg by mouth every morning.     . traMADol (ULTRAM) 50 MG tablet Take 50 mg by mouth at bedtime as needed (restlessness).     No current facility-administered medications for this visit.    REVIEW OF SYSTEMS:   10 Point review of Systems was done is negative except as noted above.  PHYSICAL EXAMINATION: ECOG PERFORMANCE STATUS: 1 - Symptomatic but completely ambulatory  *** GENERAL:alert, in no acute distress and comfortable SKIN: no acute rashes, no significant lesions EYES: conjunctiva are pink and non-injected, sclera anicteric OROPHARYNX: MMM, no exudates, no oropharyngeal erythema or ulceration NECK: supple, no JVD LYMPH:  no palpable lymphadenopathy in the cervical, axillary or inguinal regions LUNGS: clear to auscultation b/l with normal respiratory effort HEART: regular rate & rhythm ABDOMEN:   normoactive bowel sounds , non tender, not distended. Extremity: no pedal edema PSYCH: alert & oriented x 3 with fluent speech NEURO: no focal motor/sensory deficits  LABORATORY DATA:  I have reviewed the data as listed  CBC Latest Ref Rng & Units 10/06/2020 09/21/2020 09/08/2020  WBC 4.0 - 10.5 K/uL 13.6(H) 11.7(H) 15.8(H)  Hemoglobin 13.0 - 17.0 g/dL 7.4(L) 7.1(L) 6.5(LL)  Hematocrit 39.0 - 52.0 % 22.6(L) 21.4(L) 20.1(L)  Platelets 150 -  400 K/uL 387 354 365    CMP Latest Ref Rng & Units 10/06/2020 09/21/2020 09/08/2020  Glucose 70 - 99 mg/dL 170(H) 330(H) 251(H)  BUN 8 - 23 mg/dL $Remove'10 11 11  'sYqJPgc$ Creatinine 0.61 - 1.24 mg/dL 0.63 0.72 0.74  Sodium 135 - 145 mmol/L 136 136 138  Potassium 3.5 - 5.1 mmol/L 4.0 3.3(L) 4.5  Chloride 98 - 111 mmol/L 103 102 104  CO2 22 - 32 mmol/L $RemoveB'25 25 26  'amucMERu$ Calcium 8.9 - 10.3 mg/dL 8.6(L) 8.4(L) 8.5(L)  Total Protein 6.5 - 8.1 g/dL 6.4(L) 6.2(L) 6.3(L)  Total Bilirubin 0.3 - 1.2 mg/dL 1.0 1.1 1.1  Alkaline Phos 38 - 126 U/L 83 91 91  AST 15 - 41 U/L 15 26 13(L)  ALT 0 - 44 U/L 31 50(H) 32   09/28/2016 Flow Cytometry:   RADIOGRAPHIC STUDIES: I have personally reviewed the radiological images as listed and agreed with the findings in the report. No results found.  ASSESSMENT & PLAN:   80 yo with   1) T cell large granular leukemia s/p transfusion dependent anemia - previous blood transfusion dependent and was last on MTX $Remo'30mg'ewznX$  po weekly a few months ago 2) IgG Lambda MGUS 3) recent COVID-19 infection  PLAN: -Discussed pt labwork today, 10/20/2020; ***    -No lab or clinical evidence of Large Granular Lymphocytic Leukemia progression at this time. Will continue watchful observation. -Will increase Methotrexate to 12.5 mg weekly. -Continue 2 mg Folic acid, Vitamin V37, and B-complex daily. -Continue 2 units of PRBC q2weeks with labs -Will see back in ***   FOLLOW UP: ***      The total time spent in the appointment was *** minutes and more than  50% was on counseling and direct patient cares.   All of the patient's questions were answered with apparent satisfaction. The patient knows to call the clinic with any problems, questions or concerns.   Sullivan Lone MD [place] AAHIVMS [facility] Hematology/Oncology Physician [facility]  (Office):       [phone] (Work cell):  [phone] (Fax):           [phone]  10/19/2020 9:29 PM  I, Reinaldo Raddle, am acting as scribe for Dr. Sullivan Lone, MD.

## 2020-10-20 ENCOUNTER — Inpatient Hospital Stay: Payer: Medicare HMO

## 2020-10-20 ENCOUNTER — Telehealth: Payer: Self-pay | Admitting: Hematology

## 2020-10-20 ENCOUNTER — Inpatient Hospital Stay: Payer: Medicare HMO | Admitting: Hematology

## 2020-10-20 ENCOUNTER — Other Ambulatory Visit: Payer: Medicare HMO

## 2020-10-20 NOTE — Telephone Encounter (Signed)
Cancelled per 03/16 appointments per patient's request, patient does not want to reschedule and would like to come for next scheduled 03/30 appointment.

## 2020-10-21 DIAGNOSIS — K529 Noninfective gastroenteritis and colitis, unspecified: Secondary | ICD-10-CM | POA: Diagnosis not present

## 2020-10-21 DIAGNOSIS — E1122 Type 2 diabetes mellitus with diabetic chronic kidney disease: Secondary | ICD-10-CM | POA: Diagnosis not present

## 2020-10-21 DIAGNOSIS — D649 Anemia, unspecified: Secondary | ICD-10-CM | POA: Diagnosis not present

## 2020-10-21 DIAGNOSIS — C91Z0 Other lymphoid leukemia not having achieved remission: Secondary | ICD-10-CM | POA: Diagnosis not present

## 2020-10-27 DIAGNOSIS — L821 Other seborrheic keratosis: Secondary | ICD-10-CM | POA: Diagnosis not present

## 2020-10-27 DIAGNOSIS — C44311 Basal cell carcinoma of skin of nose: Secondary | ICD-10-CM | POA: Diagnosis not present

## 2020-10-27 DIAGNOSIS — L570 Actinic keratosis: Secondary | ICD-10-CM | POA: Diagnosis not present

## 2020-10-31 NOTE — Progress Notes (Signed)
Cardiology Office Note:    Date:  11/01/2020   ID:  Tommy Peterson, DOB 09-29-1940, MRN [id number]  PCP:  [facility] [place]  Cardiologist:  [name], MD   Referring MD: [facility], [facility] *   Chief Complaint  Patient presents with  . Follow-up    Afib    History of Present Illness:    Tommy Peterson is a 80 y.o. 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 PAF first diagnosed in 02/2018 in the setting of PNA. PAF RVR resolved. Loop recorder was placed that was largely unremarkable over the next year aside from paroxysmal atrial tachycardia. Downloads were suspended due to cost. He presented back in Jan 2020 with recurrence of symptomatic Afib with RVR. He was started on metoprolol and eliquis for stroke protection. At follow up, he was better rate controlled, but still in the 120s. He gets fatigued when it is time for his blood transfusion.   He does not have a history of ischemic heart disease, but did have coronary and aortic calcifications on CT in Jan 2018.  Given his borderline rate control and history of chronic anemia, Dr. Sallyanne Kuster was concerned about decompensation and opted to perform TEE-guided DCCV on 08/31/20. He converted to NSR. Moderately dilated left atrium, 2+ TR. Metoprolol was increaesd to 75 mg BID. Unfortunately, he called our office on 09/17/20 reporting HR back in the 120s. Dr. Sallyanne Kuster suspected he may be back in Afib and asked him to be evaluated.  I saw him in clinic on 09/29/20 with his significant other. He was struggling with borderline pressure at home and did not want to increase BB. I opted to start him on amiodarone load with plans for repeat DCCV. Noted that he was taking methotrexate - Dr. Irene Limbo was notified of amiodarone. He saw PA Kilroy 10/11/20 and remained in Afib in the 100s. He unfortunately missed 2 eliquis doses and the timing of his cardioversion  was reset.   He presents today for follow up, he is here alone. He is feeling unwell. He is again troubled by diarrhea. His GI said there is nothing else to offer and he requested to see Schyler Counsell, but Muhanad Torosyan refused to see him (reason unknown). He now has an appt with [facility]. He is unsure if he has missed any doses of eliquis. He is completely unaware of his rhythm. No LOC/syncope, but does recount an episode last evening in which he stood up from sitting and felt he "lost my breathe" but did not pass out.   He is generally very frustrated and feels his providers (PCP, GI) are giving up on him. He states he has celiac, but is likely still consuming cross-contaminated products. He has not seen a nutritionist. I attempted to discuss strict adherence to gluten-free diet without cross-contamination.   Over the last week he has noticed mild lower extremity swelling, no dyspnea or orthopnea.    Past Medical History:  Diagnosis Date  . Acute respiratory failure ([place])   . BCC (basal cell carcinoma of skin) 08/15/2016   w/SCC Left Forehead (treatment @ [facility])  . BPH (benign prostatic hyperplasia)   . Carpal tunnel syndrome on both sides   . Chronic diarrhea    intermittant due to chronic collagenous colitis  . Chronic large granular lymphocytic leukemia (HCC) primary hemotologist-  [name]. 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 Collagenous colitis    chronic--- intermittant between diarrahea and constipation  . ED (erectile dysfunction)   . HCAP (healthcare-associated pneumonia)   . History of adenomatous polyp of colon   . History of Clostridium difficile colitis 05/2016  . History of pneumonia 11/08/2017   CAP -- [facility]---  12-19-2017  per pt no residual symptoms  . History of  SCC (squamous cell carcinoma) of skin   . History of sepsis    11-20-2017 severe sepsis due to UTI;  10/ 2017sepsis due to c-diff colitis  . Leucocytosis    chronic  . Lower urinary tract symptoms (LUTS)   . Macrocytic anemia    since 02/ 2015  . MGUS (monoclonal gammopathy of unknown significance)    hemotologist-  dr acrasoy @   . Monoclonal paraproteinemia   . Neuropathy, peripheral   . Nodular basal cell carcinoma (BCC) 10/01/2018   Left Neck(Nodular) Pt Does Not Want Treatment  . Nodular basal cell carcinoma (BCC) 10/01/2018   Mid Forehead (treament curet and excision)  . OA (osteoarthritis)   . Prostatic stone   . Rash of face    RIGHT SIDE  . Raynaud's syndrome   . Recurrent BCC (basal cell carcinoma) 03/05/2019   positive margin  . SCC (squamous cell carcinoma) 08/10/2009   Central Forehead (Moh's Dr. Williford @ [place])  . SCC (squamous cell carcinoma) 12/08/2011   CIS-Left Cheek (treatment Aldara @ [facility])  . SCC (squamous cell carcinoma) 07/09/2015   CIS-Left Forehead (treament @ [facility])  . Septic shock (HCC)    x 3 w/UTI  . Squamous cell carcinoma in situ (SCCIS) 12/08/2011   Left Cheek (treatment Aldara @ [facility])   . Superficial basal cell carcinoma (BCC) 10/01/2018   Left Shin-Pt does not want treatment  . Thin skin    fragile  . Transfusion-dependent anemia since 10/ 2017   last transfusion PRBCs 07-19-2017  per hemologist note dated 10-25-2017  . Type 2 diabetes mellitus treated with insulin (HCC)    followed by dr kim (pcp)  . Urinary hesitancy   . Wears contact lenses    LEFT EYE ONLY    Past Surgical History:  Procedure Laterality Date  . BONE MARROW BIOPSY Right 05-24-2016;  08-20-2014  . CARDIOVASCULAR STRESS TEST  11-26-2012   dr rhinehart  @ [facility] ([place])   normal nuclear study w/ no ischemia/  normal LV function and wall motion , ef 60% (find in care everywhere,epic)  . CARDIOVERSION N/A 08/31/2020    Procedure: CARDIOVERSION;  Surgeon: Croitoru, Mihai, MD;  Location: [facility];  Service: Cardiovascular;  Laterality: N/A;  . CATARACT EXTRACTION W/ INTRAOCULAR LENS  IMPLANT, BILATERAL  2015  . CYSTOSCOPY WITH LITHOLAPAXY N/A 12/25/2017   Procedure: CYSTOSCOPY WITH LITHOLAPAXY AND FULGERATION;  Surgeon: Wrenn, John, MD;  Location: [facility];  Service: Urology;  Laterality: N/A;  . INGUINAL HERNIA REPAIR Right 1980s  . LOOP RECORDER INSERTION N/A 04/10/2018   Procedure: LOOP RECORDER INSERTION;  Surgeon: Croitoru, Mihai, MD;  Location: [facility];  Service: Cardiovascular;  Laterality: N/A;  . PENILE PROSTHESIS IMPLANT  12-02-2015   dr jon hudson @ [facility] in [place]   COLOPLAST  . SHOULDER SURGERY Left 08/2016  . TEE WITHOUT CARDIOVERSION N/A 08/31/2020   Procedure: TRANSESOPHAGEAL ECHOCARDIOGRAM (TEE);  Surgeon:   Croitoru, Dani Gobble, MD;  Location: [place];  Service: Cardiovascular;  Laterality: N/A;  . TONSILLECTOMY  child  . TRANSURETHRAL RESECTION OF PROSTATE  2009   AND REPAIR RECURRENT RIGHT INGUINAL HERNIA    Current Medications: Current Meds  Medication Sig  . amiodarone (PACERONE) 200 MG tablet Take 1 tablet (200 mg total) by mouth daily.  Marland Kitchen apixaban (ELIQUIS) 5 MG TABS tablet Take 1 tablet (5 mg total) by mouth 2 (two) times daily.  Marland Kitchen b complex vitamins capsule Take 1 capsule by mouth every evening.  . BD PEN NEEDLE NANO 2ND GEN 32G X 4 MM MISC   . budesonide (ENTOCORT EC) 3 MG 24 hr capsule Take 9 mg by mouth daily.  . Cranberry 400 MG TABS Take 400 mg by mouth daily.  . D-Mannose 500 MG CAPS Take 500 mg by mouth in the morning and at bedtime.  . folic acid (FOLVITE) 1 MG tablet TAKE 1 TABLET BY MOUTH EVERY DAY (Patient taking differently: Take 1 mg by mouth daily.)  . Insulin Glargine-Lixisenatide 100-33 UNT-MCG/ML SOPN Inject 26 Units into the skin at bedtime. Soliqua 100/33  . Losartan Potassium (COZAAR PO) losartan  . metoprolol tartrate  (LOPRESSOR) 50 MG tablet Take 50 mg by mouth as directed. Take 2 tablets twice a day of SBP above 105/60  . Misc Natural Products (DAILY HERBS MEMORY PO) Take 1 capsule by mouth daily. (300 MG) [employer]-ps  . Misc Natural Products (PROSTATE HEALTH PO) Take 1 capsule by mouth every evening. Prostate Plus Health Complex  . NOVOLOG FLEXPEN 100 UNIT/ML FlexPen Inject 5 Units into the skin every evening.  Marland Kitchen omeprazole (PRILOSEC) 40 MG capsule Take 40 mg by mouth 2 (two) times daily before a meal.  . oxybutynin (DITROPAN) 5 MG tablet Take 5 mg by mouth every evening.  . pioglitazone (ACTOS) 15 MG tablet Take 15 mg by mouth every morning.   . simvastatin (ZOCOR) 40 MG tablet simvastatin 40 mg tablet  Take 1 tablet every day by oral route.  . traMADol (ULTRAM) 50 MG tablet Take 50 mg by mouth at bedtime as needed (restlessness).     Allergies:   Bee venom, Metformin and related, Sulfamethoxazole-trimethoprim, Bee pollen, and Metformin hcl   Social History   Socioeconomic History  . Marital status: Widowed    Spouse name: engaged  . Number of children: 8  . Years of education: college-2  . Highest education level: Not on file  Occupational History  . Occupation: Press photographer  . 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 Smoking status: Former Smoker    Years: 20.00    Types: Cigarettes    Quit date: 05/04/1996    Years since quitting: 24.5  . Smokeless tobacco: Never Used  Vaping Use  . Vaping Use: Never used  Substance and Sexual Activity  . Alcohol use: Yes    Comment: 1-2 glasses of wine every other day  . Drug use: No  . Sexual activity: Never  Other Topics Concern  . Not on file  Social History Narrative  . Not on file   Social Determinants of Health   Financial Resource Strain: Not on file  Food Insecurity: Not on file  Transportation Needs: Not on file  Physical Activity: Not on file  Stress: Not on file  Social Connections: Not on file     Family  History: The patient's family history includes Diabetes in his mother; Heart failure in his mother.  ROS:   Please  see the history of present illness.     All other systems reviewed and are negative.  EKGs/Labs/Other Studies Reviewed:    The following studies were reviewed today:  TEE 08/31/20: 1. Left ventricular ejection fraction, by estimation, is 55 to 60%. The  left ventricle has normal function. The left ventricle has no regional  wall motion abnormalities. Left ventricular diastolic function could not  be evaluated.  2. Right ventricular systolic function is normal. The right ventricular  size is normal. There is normal pulmonary artery systolic pressure.  3. Left atrial size was moderately dilated. No left atrial/left atrial  appendage thrombus was detected. The LAA emptying velocity was 30 cm/s.  4. Right atrial size was mildly dilated.  5. The mitral valve is normal in structure. Mild mitral valve  regurgitation.  6. Tricuspid valve regurgitation is mild to moderate.  7. The aortic valve is tricuspid. Aortic valve regurgitation is not  visualized. No aortic stenosis is present.  8. There is Moderate (Grade III) protruding plaque involving the  descending aorta.   EKG:  EKG is  ordered today.  The ekg ordered today demonstrates atrial fibrillation with ventricular rate 82  Recent Labs: 04/10/2020: Magnesium 2.3 10/06/2020: ALT 31; BUN 10; Creatinine 0.63; Hemoglobin 7.4; Platelets 387; Potassium 4.0; Sodium 136  Recent Lipid Panel    Component Value Date/Time   CHOL 111 05/05/2016 0402   TRIG 111 04/07/2020 0055   HDL 27 (L) 05/05/2016 0402   CHOLHDL 4.1 05/05/2016 0402   VLDL 21 05/05/2016 0402   LDLCALC 63 05/05/2016 0402    Physical Exam:    VS:  BP (!) 122/56   Pulse 92   Ht 5' 9.5" (1.765 m)   Wt 167 lb 12.8 oz (76.1 kg)   SpO2 99%   BMI 24.42 kg/m     Wt Readings from Last 3 Encounters:  11/01/20 167 lb 12.8 oz (76.1 kg)  10/11/20 161 lb 6.4  oz (73.2 kg)  10/06/20 164 lb 4 oz (74.5 kg)     GEN: elderly thin male in no acute distress HEENT: Normal NECK: No JVD; No carotid bruits LYMPHATICS: No lymphadenopathy CARDIAC: irregular rhythm, regular rate, no murmurs, rubs, gallops RESPIRATORY:  Clear to auscultation without rales, wheezing or rhonchi  ABDOMEN: Soft, non-tender, non-distended MUSCULOSKELETAL:  1+ B LE edema; No deformity  SKIN: Warm and dry NEUROLOGIC:  Alert and oriented x 3 PSYCHIATRIC:  Normal affect   ASSESSMENT:    1. Persistent atrial fibrillation (HCC)   2. Chronic anticoagulation   3. Essential hypertension   4. Aortic atherosclerosis (HCC)   5. Type 2 diabetes mellitus without complication, without long-term current use of insulin (HCC)   6. Localized swelling of left lower extremity    PLAN:    In order of problems listed above:  PAF - s/p TEE-DCCV 08/31/20 - unfortunately, he has reverted back to Afib  - opted to start amiodarone due to soft BP at home - EKG today shows rate controlled Afib - I think we should try to restore sinus rhythm now that he has been on amiodarone - he does not know if he has missed any doses of eliquis, will need TEE-guided cardioversion    Lower extremity swelling - I am hesitant to add a diuretic at this point given his frequent watery diarrhea (was every hour last evening for several hours) - I am hopeful this will resolve when diarrhea resolves with next GI appt and with restoration of NSR - no   dyspnea, orthopnea or PND   Chronic anticoagulation - This patients CHA2DS2-VASc Score and unadjusted Ischemic Stroke Rate (% per year) is equal to 3.2 % stroke rate/year from a score of 3 (2age, DM) - continue eliquis - missed two doses as of 10/11/20, but can't be sure he has been 100% compliant since then   Aortic and coronary atherosclerosis - appreciated on CT - risk factor management - no angina   DM - A1c 7.5%   TEE-guided cardioversion:  [facility] has been requested to perform a transesophageal echocardiogram on [name] for atrial fibrillation.  After careful review of history and examination, the risks and benefits of transesophageal echocardiogram have been explained including risks of esophageal damage, perforation (1:10,000 risk), bleeding, pharyngeal hematoma as well as other potential complications associated with conscious sedation including aspiration, arrhythmia, respiratory failure and death. Alternatives to treatment were discussed, questions were answered. Patient is willing to proceed.    Medication Adjustments/Labs and Tests Ordered: Current medicines are reviewed at length with the patient today.  Concerns regarding medicines are outlined above.  Orders Placed This Encounter  Procedures  . CBC  . Basic metabolic panel  . EKG 12-Lead  . TRANSESOPHAGEAL ECHOCARDIOGRAM WITH CARDIOVERSION   No orders of the defined types were placed in this encounter.   Signed, Ledora Bottcher, [place]  11/01/2020 4:19 PM    [facility]

## 2020-11-01 ENCOUNTER — Ambulatory Visit: Payer: Medicare HMO | Admitting: Physician Assistant

## 2020-11-01 ENCOUNTER — Other Ambulatory Visit: Payer: Self-pay

## 2020-11-01 ENCOUNTER — Encounter: Payer: Self-pay | Admitting: Physician Assistant

## 2020-11-01 VITALS — BP 122/56 | HR 92 | Ht 69.5 in | Wt 167.8 lb

## 2020-11-01 DIAGNOSIS — R2242 Localized swelling, mass and lump, left lower limb: Secondary | ICD-10-CM | POA: Diagnosis not present

## 2020-11-01 DIAGNOSIS — I4819 Other persistent atrial fibrillation: Secondary | ICD-10-CM

## 2020-11-01 DIAGNOSIS — Z7901 Long term (current) use of anticoagulants: Secondary | ICD-10-CM | POA: Diagnosis not present

## 2020-11-01 DIAGNOSIS — I10 Essential (primary) hypertension: Secondary | ICD-10-CM

## 2020-11-01 DIAGNOSIS — E119 Type 2 diabetes mellitus without complications: Secondary | ICD-10-CM

## 2020-11-01 DIAGNOSIS — I700 Atherosclerosis of aorta: Secondary | ICD-10-CM

## 2020-11-01 NOTE — Patient Instructions (Addendum)
Medication Instructions:  No Changes *If you need a refill on your cardiac medications before your next appointment, please call your pharmacy*   Lab Work: [facility] If you have labs (blood work) drawn today and your tests are completely normal, you will receive your results only by: Marland Kitchen MyChart Message (if you have MyChart) OR . A paper copy in the mail If you have any lab test that is abnormal or we need to change your treatment, we will call you to review the results.   Testing/Procedures: Your physician has recommended that you have a Cardioversion (DCCV). Electrical Cardioversion uses a jolt of electricity to your heart either through paddles or wired patches attached to your chest. This is a controlled, usually prescheduled, procedure. Defibrillation is done under light anesthesia in the hospital, and you usually go home the day of the procedure. This is done to get your heart back into a normal rhythm. You are not awake for the procedure. Please see the instruction sheet given to you today.  Dear Tommy Peterson You are scheduled for a TEE/Cardioversion/TEE Cardioversion on November 05, 2020 with Dr. Dorris Carnes.  Please arrive at the [facility] (Main Entrance A) at [facility]: [address] [place], [place] [address] at 9 am/pm. (1 hour prior to procedure unless lab work is needed; if lab work is needed arrive 1.5 hours ahead)  DIET: Nothing to eat or drink after midnight except a sip of water with medications (see medication instructions below)  Medication Instructions: Hold None  Continue your anticoagulant: Eliquis You will need to continue your anticoagulant after your procedure until you  are told by your  Provider that it is safe to stop   Labs: If patient is on Coumadin, patient needs pt/INR, CBC, BMET within 3 days (No pt/INR needed for patients taking Xarelto, Eliquis, Pradaxa) For patients receiving anesthesia for TEE and all Cardioversion patients: BMET, CBC  within 1 week   You must have a responsible person to drive you home and stay in the waiting area during your procedure. Failure to do so could result in cancellation.  Bring your insurance cards.  *Special Note: Every effort is made to have your procedure done on time. Occasionally there are emergencies that occur at the hospital that may cause delays. Please be patient if a delay does occur.     Follow-Up: At [facility], you and your health needs are our priority.  As part of our continuing mission to provide you with exceptional heart care, we have created designated Provider Care Teams.  These Care Teams include your primary Cardiologist (physician) and Advanced Practice Providers (APPs -  Physician Assistants and Nurse Practitioners) who all work together to provide you with the care you need, when you need it.      Your next appointment:   2 week(s)  The format for your next appointment:   In Person  Provider:   [name], MD

## 2020-11-02 ENCOUNTER — Telehealth: Payer: Self-pay | Admitting: Physician Assistant

## 2020-11-02 ENCOUNTER — Telehealth: Payer: Self-pay | Admitting: *Deleted

## 2020-11-02 ENCOUNTER — Other Ambulatory Visit (HOSPITAL_COMMUNITY)
Admission: RE | Admit: 2020-11-02 | Discharge: 2020-11-02 | Disposition: A | Discharge status: Home / Self Care | Payer: Medicare HMO | Source: Ambulatory Visit | Attending: Physician Assistant | Admitting: Physician Assistant

## 2020-11-02 ENCOUNTER — Other Ambulatory Visit: Payer: Self-pay

## 2020-11-02 DIAGNOSIS — Z01812 Encounter for preprocedural laboratory examination: Secondary | ICD-10-CM | POA: Insufficient documentation

## 2020-11-02 DIAGNOSIS — C91Z0 Other lymphoid leukemia not having achieved remission: Secondary | ICD-10-CM

## 2020-11-02 DIAGNOSIS — D649 Anemia, unspecified: Secondary | ICD-10-CM

## 2020-11-02 DIAGNOSIS — Z20822 Contact with and (suspected) exposure to covid-19: Secondary | ICD-10-CM | POA: Insufficient documentation

## 2020-11-02 LAB — BASIC METABOLIC PANEL
BUN/Creatinine Ratio: 19 (ref 10–24)
BUN: 11 mg/dL (ref 8–27)
CO2: 22 mmol/L (ref 20–29)
Calcium: 8.6 mg/dL (ref 8.6–10.2)
Chloride: 103 mmol/L (ref 96–106)
Creatinine, Ser: 0.57 mg/dL — ABNORMAL LOW (ref 0.76–1.27)
Glucose: 274 mg/dL — ABNORMAL HIGH (ref 65–99)
Potassium: 4.4 mmol/L (ref 3.5–5.2)
Sodium: 139 mmol/L (ref 134–144)
eGFR: 99 mL/min/{1.73_m2} (ref >59)

## 2020-11-02 LAB — CBC
Hematocrit: 17.9 % — CL (ref 37.5–51.0)
Hemoglobin: 5.9 g/dL — CL (ref 13.0–17.7)
MCH: 30.6 pg (ref 26.6–33.0)
MCHC: 33 g/dL (ref 31.5–35.7)
MCV: 93 fL (ref 79–97)
Platelets: 356 10*3/uL (ref 150–450)
RBC: 1.93 x10E6/uL — CL (ref 4.14–5.80)
RDW: 19.2 % — ABNORMAL HIGH (ref 11.6–15.4)
WBC: 12.3 10*3/uL — ABNORMAL HIGH (ref 3.4–10.8)

## 2020-11-02 LAB — SARS CORONAVIRUS 2 (TAT 6-24 HRS): SARS Coronavirus 2: NEGATIVE

## 2020-11-02 NOTE — Progress Notes (Signed)
Thank you. Not always easy

## 2020-11-02 NOTE — Progress Notes (Signed)
Has his TEE-CV been scheduled?

## 2020-11-02 NOTE — Telephone Encounter (Signed)
[facility] Called results of abnormal  -Hgb 5.9 , HCt 17.9 from 11/02/19  Contacted A. Duke PA.. She states she will handle  It from here.   The labs were ordered by Yesterday. Still awaiting result to be upload to the system

## 2020-11-02 NOTE — Telephone Encounter (Signed)
In preparation for his TEE-DCCV on Friday April 1, CBC was drawn that resulted with a Hb 5.9, HCT 17.9.   He has a history of chronic large granular lymphocytic  leukemia and has transfusion-dependent anemia - he has routine blood transfusions with a baseline Hb in the 7 range. I confirmed with Dr. Grier Mitts office today that he is scheduled to receive 2U PRBC tomorrow, 11/03/20. They do not perform post-transfusion labs. We will collect an iStat when he arrives Friday prior to TEE.   Case discussed with Dr. Sallyanne Kuster, primary cardiologist. I will also notify Dr. Harrington Challenger who will be the performing physician that day.  Questions: Tommy Peterson - Dr. Grier Mitts office - [phone].   Tommy Lin Jennesis Ramaswamy, PA-C 11/02/2020, 10:54 AM

## 2020-11-02 NOTE — Progress Notes (Signed)
Pt scheduled for 2 units of PRBC's on 11/03/20. Orders placed per Dr. Irene Limbo. Blood bank contacted and verified.

## 2020-11-03 ENCOUNTER — Inpatient Hospital Stay: Payer: Medicare HMO

## 2020-11-03 ENCOUNTER — Other Ambulatory Visit: Payer: Self-pay

## 2020-11-03 ENCOUNTER — Other Ambulatory Visit: Payer: Self-pay | Admitting: *Deleted

## 2020-11-03 DIAGNOSIS — D649 Anemia, unspecified: Secondary | ICD-10-CM

## 2020-11-03 DIAGNOSIS — C91Z0 Other lymphoid leukemia not having achieved remission: Secondary | ICD-10-CM

## 2020-11-03 LAB — CMP (CANCER CENTER ONLY)
ALT: 41 U/L (ref 0–44)
AST: 22 U/L (ref 15–41)
Albumin: 3.3 g/dL — ABNORMAL LOW (ref 3.5–5.0)
Alkaline Phosphatase: 100 U/L (ref 38–126)
Anion gap: 8 (ref 5–15)
BUN: 15 mg/dL (ref 8–23)
CO2: 25 mmol/L (ref 22–32)
Calcium: 8.1 mg/dL — ABNORMAL LOW (ref 8.9–10.3)
Chloride: 106 mmol/L (ref 98–111)
Creatinine: 0.63 mg/dL (ref 0.61–1.24)
GFR, Estimated: >60 mL/min (ref >60)
Glucose, Bld: 234 mg/dL — ABNORMAL HIGH (ref 70–99)
Potassium: 4.2 mmol/L (ref 3.5–5.1)
Sodium: 139 mmol/L (ref 135–145)
Total Bilirubin: 0.9 mg/dL (ref 0.3–1.2)
Total Protein: 6.1 g/dL — ABNORMAL LOW (ref 6.5–8.1)

## 2020-11-03 LAB — CBC WITH DIFFERENTIAL/PLATELET
Abs Immature Granulocytes: 0.13 10*3/uL — ABNORMAL HIGH (ref 0.00–0.07)
Basophils Absolute: 0 10*3/uL (ref 0.0–0.1)
Basophils Relative: 0 %
Eosinophils Absolute: 0 10*3/uL (ref 0.0–0.5)
Eosinophils Relative: 0 %
HCT: 16.9 % — ABNORMAL LOW (ref 39.0–52.0)
Hemoglobin: 5.5 g/dL — CL (ref 13.0–17.0)
Immature Granulocytes: 1 %
Lymphocytes Relative: 42 %
Lymphs Abs: 4.7 10*3/uL — ABNORMAL HIGH (ref 0.7–4.0)
MCH: 30.6 pg (ref 26.0–34.0)
MCHC: 32.5 g/dL (ref 30.0–36.0)
MCV: 93.9 fL (ref 80.0–100.0)
Monocytes Absolute: 1.4 10*3/uL — ABNORMAL HIGH (ref 0.1–1.0)
Monocytes Relative: 12 %
Neutro Abs: 4.8 10*3/uL (ref 1.7–7.7)
Neutrophils Relative %: 45 %
Platelets: 348 10*3/uL (ref 150–400)
RBC: 1.8 MIL/uL — ABNORMAL LOW (ref 4.22–5.81)
RDW: 21.2 % — ABNORMAL HIGH (ref 11.5–15.5)
WBC: 11 10*3/uL — ABNORMAL HIGH (ref 4.0–10.5)
nRBC: 0 % (ref 0.0–0.2)

## 2020-11-03 LAB — SAMPLE TO BLOOD BANK

## 2020-11-03 LAB — PREPARE RBC (CROSSMATCH)

## 2020-11-03 MED ORDER — METHYLPREDNISOLONE SODIUM SUCC 40 MG IJ SOLR
INTRAMUSCULAR | Status: AC
  Filled 2020-11-03: qty 1

## 2020-11-03 MED ORDER — METHYLPREDNISOLONE SODIUM SUCC 40 MG IJ SOLR
40.0000 mg | Freq: Once | INTRAMUSCULAR | Status: AC
  Administered 2020-11-03: 40 mg via INTRAVENOUS

## 2020-11-03 MED ORDER — ACETAMINOPHEN 325 MG PO TABS
650.0000 mg | ORAL_TABLET | Freq: Once | ORAL | Status: AC
  Administered 2020-11-03: 650 mg via ORAL

## 2020-11-03 MED ORDER — ACETAMINOPHEN 325 MG PO TABS
ORAL_TABLET | ORAL | Status: AC
  Filled 2020-11-03: qty 2

## 2020-11-03 MED ORDER — SODIUM CHLORIDE 0.9% IV SOLUTION
250.0000 mL | Freq: Once | INTRAVENOUS | Status: AC
  Administered 2020-11-03: 250 mL via INTRAVENOUS
  Filled 2020-11-03: qty 250

## 2020-11-03 NOTE — Progress Notes (Signed)
CRITICAL VALUE STICKER  CRITICAL VALUE: Hgb: 5.5  DATE & TIME NOTIFIED: 11/03/20 10:06 am  MD NOTIFIED: Dr Irene Limbo  TIME OF NOTIFICATION: 10:10  RESPONSE: Pt already has orders for 2 units today.

## 2020-11-03 NOTE — Patient Instructions (Signed)

## 2020-11-04 ENCOUNTER — Other Ambulatory Visit: Payer: Self-pay | Admitting: Physician Assistant

## 2020-11-04 LAB — TYPE AND SCREEN
ABO/RH(D): O POS
Antibody Screen: NEGATIVE
Unit division: 0
Unit division: 0

## 2020-11-04 LAB — BPAM RBC
Blood Product Expiration Date: 202204272359
Blood Product Expiration Date: 202204272359
ISSUE DATE / TIME: 202203301133
ISSUE DATE / TIME: 202203301133
Unit Type and Rh: 5100
Unit Type and Rh: 5100

## 2020-11-04 NOTE — Telephone Encounter (Signed)
Patient is following up regarding 11/05/20 TEE-DCCV. He states yesterday his hemoglobin was at 5.5 which is below his normal parameters. He states he got it checked again and it was around the 7 range. He would like to know if this means he will need to reschedule his procedure. He suggested, maybe we can contact the cancer center to discuss normal hgb parameters.

## 2020-11-04 NOTE — Telephone Encounter (Signed)
Spoke with pt and made him aware, per Angie Duke's note, the plan is to check an iStat prior to his procedure.  Advised pt to come at 8:30am instead of 9am so we can get his labs completed.  Pt agreeable to plan.

## 2020-11-05 ENCOUNTER — Encounter (HOSPITAL_COMMUNITY)
Admission: RE | Disposition: A | Discharge status: Home / Self Care | Payer: Self-pay | Source: Home / Self Care | Attending: Internal Medicine

## 2020-11-05 ENCOUNTER — Encounter (HOSPITAL_COMMUNITY): Payer: Self-pay | Admitting: Certified Registered Nurse Anesthetist

## 2020-11-05 ENCOUNTER — Ambulatory Visit (HOSPITAL_COMMUNITY)
Admission: RE | Admit: 2020-11-05 | Discharge: 2020-11-05 | Disposition: A | Discharge status: Home / Self Care | Payer: Medicare HMO | Source: Ambulatory Visit | Attending: Physician Assistant | Admitting: Physician Assistant

## 2020-11-05 ENCOUNTER — Ambulatory Visit (HOSPITAL_COMMUNITY)
Admission: RE | Admit: 2020-11-05 | Discharge: 2020-11-05 | Disposition: A | Discharge status: Home / Self Care | Payer: Medicare HMO | Source: Home / Self Care | Attending: Internal Medicine | Admitting: Internal Medicine

## 2020-11-05 ENCOUNTER — Other Ambulatory Visit: Payer: Self-pay

## 2020-11-05 DIAGNOSIS — Z20822 Contact with and (suspected) exposure to covid-19: Secondary | ICD-10-CM | POA: Diagnosis not present

## 2020-11-05 DIAGNOSIS — Z538 Procedure and treatment not carried out for other reasons: Secondary | ICD-10-CM | POA: Insufficient documentation

## 2020-11-05 DIAGNOSIS — I4819 Other persistent atrial fibrillation: Secondary | ICD-10-CM | POA: Diagnosis not present

## 2020-11-05 DIAGNOSIS — C9110 Chronic lymphocytic leukemia of B-cell type not having achieved remission: Secondary | ICD-10-CM | POA: Diagnosis not present

## 2020-11-05 DIAGNOSIS — B9620 Unspecified Escherichia coli [E. coli] as the cause of diseases classified elsewhere: Secondary | ICD-10-CM | POA: Diagnosis not present

## 2020-11-05 DIAGNOSIS — J9601 Acute respiratory failure with hypoxia: Secondary | ICD-10-CM | POA: Diagnosis not present

## 2020-11-05 DIAGNOSIS — I509 Heart failure, unspecified: Secondary | ICD-10-CM | POA: Diagnosis not present

## 2020-11-05 DIAGNOSIS — E119 Type 2 diabetes mellitus without complications: Secondary | ICD-10-CM | POA: Diagnosis not present

## 2020-11-05 DIAGNOSIS — N390 Urinary tract infection, site not specified: Secondary | ICD-10-CM | POA: Diagnosis not present

## 2020-11-05 DIAGNOSIS — I110 Hypertensive heart disease with heart failure: Secondary | ICD-10-CM | POA: Diagnosis not present

## 2020-11-05 DIAGNOSIS — I4891 Unspecified atrial fibrillation: Secondary | ICD-10-CM | POA: Insufficient documentation

## 2020-11-05 DIAGNOSIS — G9341 Metabolic encephalopathy: Secondary | ICD-10-CM | POA: Diagnosis not present

## 2020-11-05 DIAGNOSIS — I5033 Acute on chronic diastolic (congestive) heart failure: Secondary | ICD-10-CM | POA: Diagnosis not present

## 2020-11-05 SURGERY — CANCELLED PROCEDURE

## 2020-11-05 NOTE — Progress Notes (Signed)
Patient admitted to endo for tee/dccv, upon taking vital signs patient appeared to be in NSR. EKG taken and confirmed NSR. MD Harrington Challenger notified and spoke with patient and patient understands we do not need to do anything today. Patient discharged home.

## 2020-11-06 ENCOUNTER — Emergency Department (HOSPITAL_BASED_OUTPATIENT_CLINIC_OR_DEPARTMENT_OTHER): Payer: Medicare HMO

## 2020-11-06 ENCOUNTER — Encounter (HOSPITAL_BASED_OUTPATIENT_CLINIC_OR_DEPARTMENT_OTHER): Payer: Self-pay | Admitting: Emergency Medicine

## 2020-11-06 ENCOUNTER — Observation Stay (HOSPITAL_COMMUNITY): Payer: Medicare HMO

## 2020-11-06 ENCOUNTER — Other Ambulatory Visit: Payer: Self-pay

## 2020-11-06 ENCOUNTER — Inpatient Hospital Stay (HOSPITAL_BASED_OUTPATIENT_CLINIC_OR_DEPARTMENT_OTHER)
Admission: EM | Admit: 2020-11-06 | Discharge: 2020-11-08 | DRG: 291 | Disposition: A | Discharge status: Home / Self Care | Payer: Medicare HMO | Attending: Family Medicine | Admitting: Family Medicine

## 2020-11-06 DIAGNOSIS — Z85828 Personal history of other malignant neoplasm of skin: Secondary | ICD-10-CM

## 2020-11-06 DIAGNOSIS — I509 Heart failure, unspecified: Secondary | ICD-10-CM | POA: Diagnosis not present

## 2020-11-06 DIAGNOSIS — Z7901 Long term (current) use of anticoagulants: Secondary | ICD-10-CM

## 2020-11-06 DIAGNOSIS — E8770 Fluid overload, unspecified: Secondary | ICD-10-CM | POA: Diagnosis present

## 2020-11-06 DIAGNOSIS — G9341 Metabolic encephalopathy: Secondary | ICD-10-CM | POA: Diagnosis not present

## 2020-11-06 DIAGNOSIS — J811 Chronic pulmonary edema: Secondary | ICD-10-CM | POA: Diagnosis not present

## 2020-11-06 DIAGNOSIS — R791 Abnormal coagulation profile: Secondary | ICD-10-CM | POA: Diagnosis not present

## 2020-11-06 DIAGNOSIS — Z833 Family history of diabetes mellitus: Secondary | ICD-10-CM | POA: Diagnosis not present

## 2020-11-06 DIAGNOSIS — I4819 Other persistent atrial fibrillation: Secondary | ICD-10-CM | POA: Diagnosis present

## 2020-11-06 DIAGNOSIS — R739 Hyperglycemia, unspecified: Secondary | ICD-10-CM | POA: Diagnosis not present

## 2020-11-06 DIAGNOSIS — D649 Anemia, unspecified: Secondary | ICD-10-CM | POA: Diagnosis not present

## 2020-11-06 DIAGNOSIS — C9110 Chronic lymphocytic leukemia of B-cell type not having achieved remission: Secondary | ICD-10-CM | POA: Diagnosis present

## 2020-11-06 DIAGNOSIS — I110 Hypertensive heart disease with heart failure: Principal | ICD-10-CM | POA: Diagnosis present

## 2020-11-06 DIAGNOSIS — I517 Cardiomegaly: Secondary | ICD-10-CM | POA: Diagnosis not present

## 2020-11-06 DIAGNOSIS — Z79899 Other long term (current) drug therapy: Secondary | ICD-10-CM

## 2020-11-06 DIAGNOSIS — I5033 Acute on chronic diastolic (congestive) heart failure: Secondary | ICD-10-CM | POA: Diagnosis not present

## 2020-11-06 DIAGNOSIS — Z794 Long term (current) use of insulin: Secondary | ICD-10-CM | POA: Diagnosis not present

## 2020-11-06 DIAGNOSIS — Z20822 Contact with and (suspected) exposure to covid-19: Secondary | ICD-10-CM | POA: Diagnosis not present

## 2020-11-06 DIAGNOSIS — E119 Type 2 diabetes mellitus without complications: Secondary | ICD-10-CM | POA: Diagnosis not present

## 2020-11-06 DIAGNOSIS — J9601 Acute respiratory failure with hypoxia: Secondary | ICD-10-CM | POA: Diagnosis not present

## 2020-11-06 DIAGNOSIS — Z8249 Family history of ischemic heart disease and other diseases of the circulatory system: Secondary | ICD-10-CM

## 2020-11-06 DIAGNOSIS — K900 Celiac disease: Secondary | ICD-10-CM | POA: Diagnosis present

## 2020-11-06 DIAGNOSIS — A419 Sepsis, unspecified organism: Secondary | ICD-10-CM | POA: Diagnosis not present

## 2020-11-06 DIAGNOSIS — K529 Noninfective gastroenteritis and colitis, unspecified: Secondary | ICD-10-CM | POA: Diagnosis not present

## 2020-11-06 DIAGNOSIS — R7989 Other specified abnormal findings of blood chemistry: Secondary | ICD-10-CM | POA: Diagnosis present

## 2020-11-06 DIAGNOSIS — Z87891 Personal history of nicotine dependence: Secondary | ICD-10-CM | POA: Diagnosis not present

## 2020-11-06 DIAGNOSIS — N390 Urinary tract infection, site not specified: Secondary | ICD-10-CM | POA: Diagnosis present

## 2020-11-06 DIAGNOSIS — Z743 Need for continuous supervision: Secondary | ICD-10-CM | POA: Diagnosis not present

## 2020-11-06 DIAGNOSIS — B9620 Unspecified Escherichia coli [E. coli] as the cause of diseases classified elsewhere: Secondary | ICD-10-CM | POA: Diagnosis present

## 2020-11-06 DIAGNOSIS — J9811 Atelectasis: Secondary | ICD-10-CM | POA: Diagnosis not present

## 2020-11-06 LAB — URINALYSIS, ROUTINE W REFLEX MICROSCOPIC
Bilirubin Urine: NEGATIVE
Glucose, UA: NEGATIVE mg/dL
Hgb urine dipstick: NEGATIVE
Ketones, ur: NEGATIVE mg/dL
Nitrite: POSITIVE — AB
Protein, ur: NEGATIVE mg/dL
Specific Gravity, Urine: 1.018 (ref 1.005–1.030)
WBC, UA: >50 WBC/hpf — ABNORMAL HIGH (ref 0–5)
pH: 5 (ref 5.0–8.0)

## 2020-11-06 LAB — COMPREHENSIVE METABOLIC PANEL
ALT: 84 U/L — ABNORMAL HIGH (ref 0–44)
AST: 35 U/L (ref 15–41)
Albumin: 3.4 g/dL — ABNORMAL LOW (ref 3.5–5.0)
Alkaline Phosphatase: 84 U/L (ref 38–126)
Anion gap: 8 (ref 5–15)
BUN: 15 mg/dL (ref 8–23)
CO2: 24 mmol/L (ref 22–32)
Calcium: 8.3 mg/dL — ABNORMAL LOW (ref 8.9–10.3)
Chloride: 105 mmol/L (ref 98–111)
Creatinine, Ser: 0.54 mg/dL — ABNORMAL LOW (ref 0.61–1.24)
GFR, Estimated: >60 mL/min (ref >60)
Glucose, Bld: 214 mg/dL — ABNORMAL HIGH (ref 70–99)
Potassium: 4.7 mmol/L (ref 3.5–5.1)
Sodium: 137 mmol/L (ref 135–145)
Total Bilirubin: 1.1 mg/dL (ref 0.3–1.2)
Total Protein: 5.8 g/dL — ABNORMAL LOW (ref 6.5–8.1)

## 2020-11-06 LAB — GLUCOSE, CAPILLARY
Glucose-Capillary: 133 mg/dL — ABNORMAL HIGH (ref 70–99)
Glucose-Capillary: 189 mg/dL — ABNORMAL HIGH (ref 70–99)
Glucose-Capillary: 173 mg/dL — ABNORMAL HIGH (ref 70–99)

## 2020-11-06 LAB — BRAIN NATRIURETIC PEPTIDE: B Natriuretic Peptide: 389.9 pg/mL — ABNORMAL HIGH (ref 0.0–100.0)

## 2020-11-06 LAB — LACTIC ACID, PLASMA
Lactic Acid, Venous: 1.6 mmol/L (ref 0.5–1.9)
Lactic Acid, Venous: 1.2 mmol/L (ref 0.5–1.9)

## 2020-11-06 LAB — CBC WITH DIFFERENTIAL/PLATELET
Abs Immature Granulocytes: 0.22 10*3/uL — ABNORMAL HIGH (ref 0.00–0.07)
Basophils Absolute: 0 10*3/uL (ref 0.0–0.1)
Basophils Relative: 0 %
Eosinophils Absolute: 0 10*3/uL (ref 0.0–0.5)
Eosinophils Relative: 0 %
HCT: 22.6 % — ABNORMAL LOW (ref 39.0–52.0)
Hemoglobin: 7.5 g/dL — ABNORMAL LOW (ref 13.0–17.0)
Immature Granulocytes: 1 %
Lymphocytes Relative: 21 %
Lymphs Abs: 3.4 10*3/uL (ref 0.7–4.0)
MCH: 31.4 pg (ref 26.0–34.0)
MCHC: 33.2 g/dL (ref 30.0–36.0)
MCV: 94.6 fL (ref 80.0–100.0)
Monocytes Absolute: 1.8 10*3/uL — ABNORMAL HIGH (ref 0.1–1.0)
Monocytes Relative: 11 %
Neutro Abs: 11.2 10*3/uL — ABNORMAL HIGH (ref 1.7–7.7)
Neutrophils Relative %: 67 %
Platelets: 304 10*3/uL (ref 150–400)
RBC: 2.39 MIL/uL — ABNORMAL LOW (ref 4.22–5.81)
RDW: 19.9 % — ABNORMAL HIGH (ref 11.5–15.5)
WBC: 16.7 10*3/uL — ABNORMAL HIGH (ref 4.0–10.5)
nRBC: 0 % (ref 0.0–0.2)

## 2020-11-06 LAB — HEPATITIS PANEL, ACUTE
HCV Ab: REACTIVE — AB
Hep A IgM: NONREACTIVE
Hep B C IgM: NONREACTIVE
Hepatitis B Surface Ag: NONREACTIVE

## 2020-11-06 LAB — ECHOCARDIOGRAM COMPLETE
Area-P 1/2: 3.12 cm2
Height: 69.5 in
S' Lateral: 3.1 cm
Weight: 2723.12 oz

## 2020-11-06 LAB — HEMOGLOBIN A1C
Hgb A1c MFr Bld: 8.3 % — ABNORMAL HIGH (ref 4.8–5.6)
Mean Plasma Glucose: 191.51 mg/dL

## 2020-11-06 LAB — STREP PNEUMONIAE URINARY ANTIGEN: Strep Pneumo Urinary Antigen: NEGATIVE

## 2020-11-06 LAB — RESP PANEL BY RT-PCR (FLU A&B, COVID) ARPGX2
Influenza A by PCR: NEGATIVE
Influenza B by PCR: NEGATIVE
SARS Coronavirus 2 by RT PCR: NEGATIVE

## 2020-11-06 LAB — APTT: aPTT: 40 seconds — ABNORMAL HIGH (ref 24–36)

## 2020-11-06 LAB — CBG MONITORING, ED: Glucose-Capillary: 194 mg/dL — ABNORMAL HIGH (ref 70–99)

## 2020-11-06 LAB — PROTIME-INR
INR: 1.7 — ABNORMAL HIGH (ref 0.8–1.2)
Prothrombin Time: 19.1 seconds — ABNORMAL HIGH (ref 11.4–15.2)

## 2020-11-06 IMAGING — DX DG CHEST 1V PORT
1 series · 1 of 1 positions shown · non-contrast
Comparison: [DATE]

CLINICAL DATA: Questionable sepsis

EXAM:
PORTABLE CHEST 1 VIEW

[chest]
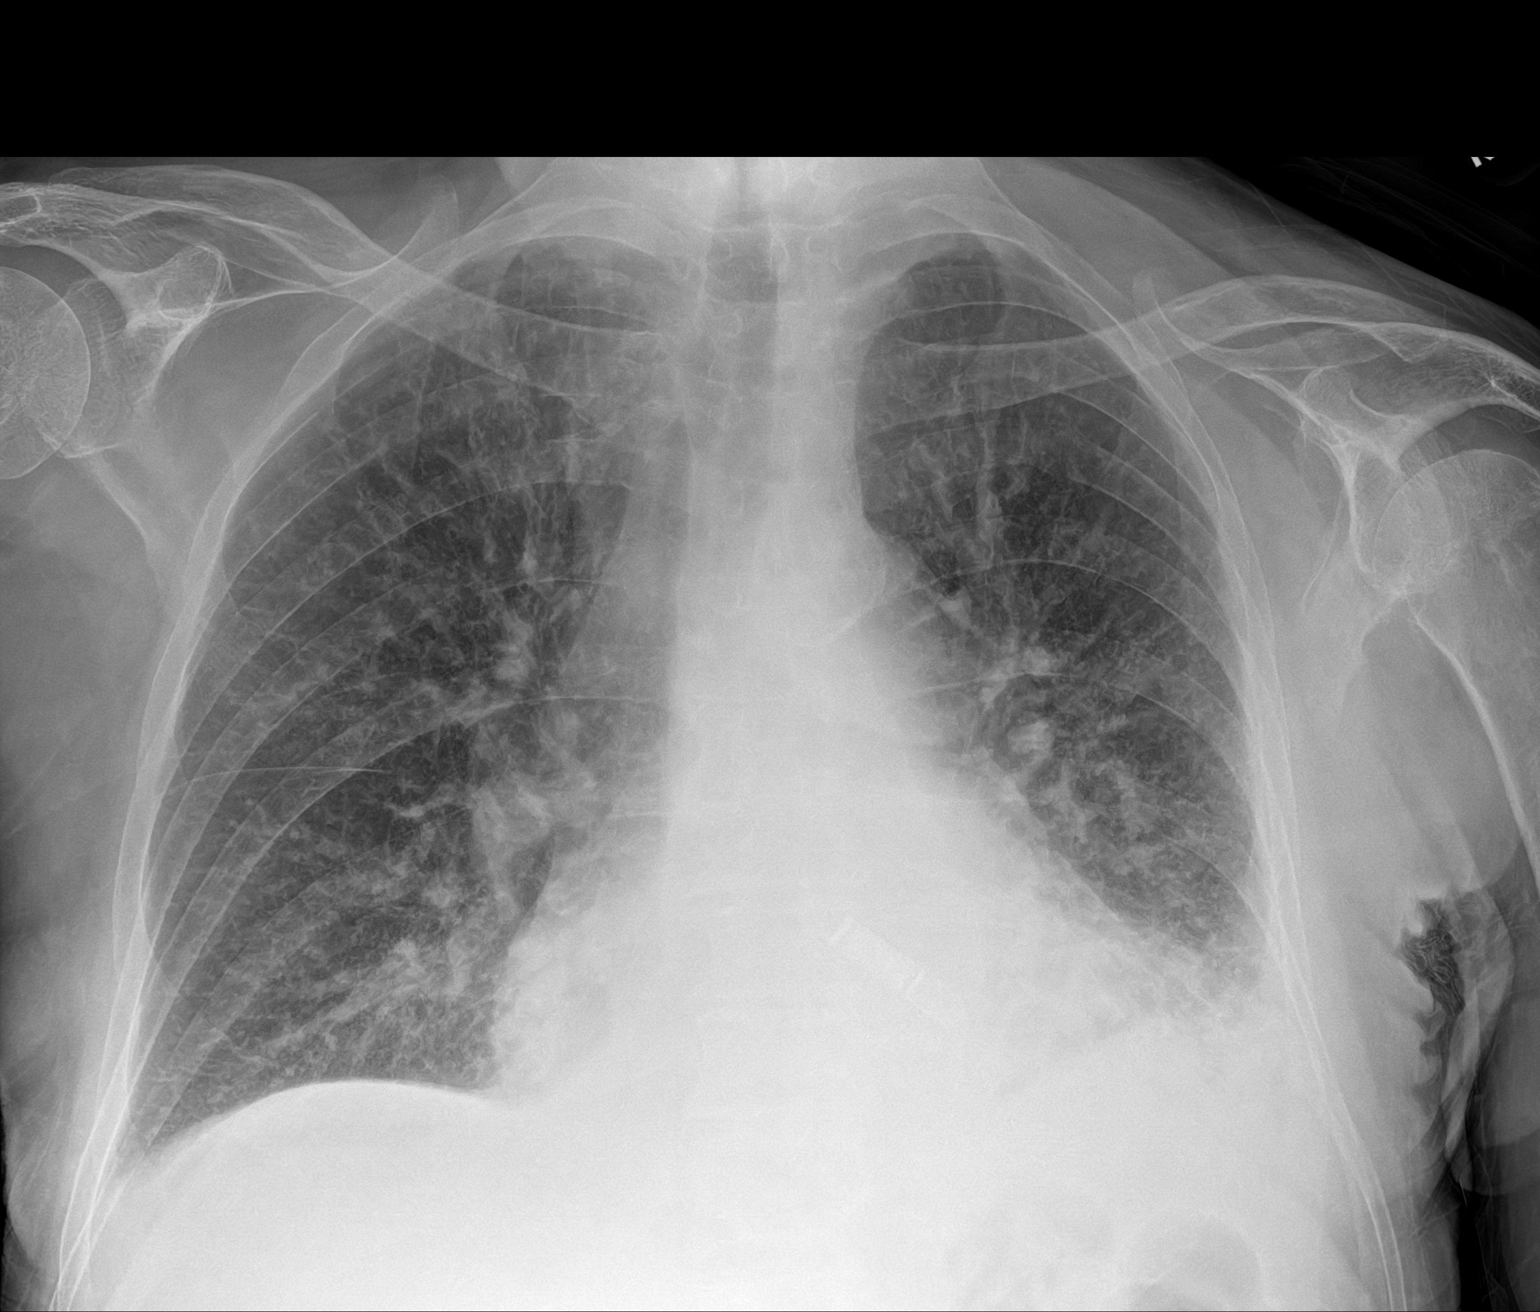

[1 of 1 positions shown; findings below may reference images not displayed]

FINDINGS: Cardiomegaly and generalized interstitial prominence with a few
Kerley lines. Asymmetric opacity at the left base with mild
elevation of the left diaphragm. Implantable loop recorder. No
pneumothorax.
IMPRESSION: Cardiomegaly and mild pulmonary edema.

Asymmetric density at the left base shows volume loss and is
presumably atelectasis. Pneumonia is not excluded in the setting of
possible sepsis.

## 2020-11-06 MED ORDER — METOPROLOL TARTRATE 50 MG PO TABS
50.0000 mg | ORAL_TABLET | Freq: Two times a day (BID) | ORAL | Status: DC
  Administered 2020-11-06 (×2): 50 mg via ORAL
  Filled 2020-11-06 (×5): qty 1

## 2020-11-06 MED ORDER — ACETAMINOPHEN 325 MG PO TABS
650.0000 mg | ORAL_TABLET | Freq: Four times a day (QID) | ORAL | Status: DC | PRN
  Administered 2020-11-06 (×2): 650 mg via ORAL
  Filled 2020-11-06 (×2): qty 2

## 2020-11-06 MED ORDER — BUDESONIDE 3 MG PO CPEP
9.0000 mg | ORAL_CAPSULE | Freq: Every day | ORAL | Status: DC
  Administered 2020-11-06 – 2020-11-08 (×3): 9 mg via ORAL
  Filled 2020-11-06 (×3): qty 3

## 2020-11-06 MED ORDER — SODIUM CHLORIDE 0.9 % IV SOLN
1.0000 g | Freq: Once | INTRAVENOUS | Status: AC
  Administered 2020-11-06: 1 g via INTRAVENOUS
  Filled 2020-11-06: qty 10

## 2020-11-06 MED ORDER — ACETAMINOPHEN 650 MG RE SUPP: 650.0000 mg | Freq: Four times a day (QID) | RECTAL | Status: DC | PRN

## 2020-11-06 MED ORDER — APIXABAN 5 MG PO TABS
5.0000 mg | ORAL_TABLET | Freq: Two times a day (BID) | ORAL | Status: DC
  Administered 2020-11-06 – 2020-11-08 (×5): 5 mg via ORAL
  Filled 2020-11-06 (×5): qty 1

## 2020-11-06 MED ORDER — SODIUM CHLORIDE 0.9 % IV SOLN
500.0000 mg | Freq: Once | INTRAVENOUS | Status: AC
  Administered 2020-11-06: 500 mg via INTRAVENOUS
  Filled 2020-11-06: qty 500

## 2020-11-06 MED ORDER — INSULIN ASPART 100 UNIT/ML ~~LOC~~ SOLN
0.0000 [IU] | Freq: Every day | SUBCUTANEOUS | Status: DC
  Administered 2020-11-07: 4 [IU] via SUBCUTANEOUS

## 2020-11-06 MED ORDER — SODIUM CHLORIDE 0.9 % IV SOLN
2.0000 g | INTRAVENOUS | Status: DC
  Administered 2020-11-07 – 2020-11-08 (×2): 2 g via INTRAVENOUS
  Filled 2020-11-06: qty 20
  Filled 2020-11-06: qty 2

## 2020-11-06 MED ORDER — DOXYCYCLINE HYCLATE 100 MG PO TABS
100.0000 mg | ORAL_TABLET | Freq: Two times a day (BID) | ORAL | Status: DC
  Administered 2020-11-07 – 2020-11-08 (×3): 100 mg via ORAL
  Filled 2020-11-06 (×3): qty 1

## 2020-11-06 MED ORDER — AZITHROMYCIN 250 MG PO TABS: 500.0000 mg | ORAL_TABLET | Freq: Every day | ORAL | Status: DC

## 2020-11-06 MED ORDER — AMIODARONE HCL 200 MG PO TABS
200.0000 mg | ORAL_TABLET | Freq: Every day | ORAL | Status: DC
  Administered 2020-11-06: 200 mg via ORAL
  Filled 2020-11-06 (×3): qty 1

## 2020-11-06 MED ORDER — FUROSEMIDE 40 MG PO TABS
40.0000 mg | ORAL_TABLET | Freq: Every day | ORAL | Status: DC
  Administered 2020-11-07: 40 mg via ORAL
  Filled 2020-11-06: qty 1

## 2020-11-06 MED ORDER — FOLIC ACID 1 MG PO TABS
1.0000 mg | ORAL_TABLET | Freq: Every day | ORAL | Status: DC
  Administered 2020-11-06 – 2020-11-08 (×3): 1 mg via ORAL
  Filled 2020-11-06 (×3): qty 1

## 2020-11-06 MED ORDER — FUROSEMIDE 10 MG/ML IJ SOLN
20.0000 mg | Freq: Once | INTRAMUSCULAR | Status: AC
  Administered 2020-11-06: 20 mg via INTRAVENOUS
  Filled 2020-11-06: qty 2

## 2020-11-06 MED ORDER — INSULIN ASPART 100 UNIT/ML ~~LOC~~ SOLN
0.0000 [IU] | Freq: Three times a day (TID) | SUBCUTANEOUS | Status: DC
  Administered 2020-11-06: 1 [IU] via SUBCUTANEOUS
  Administered 2020-11-06: 2 [IU] via SUBCUTANEOUS
  Administered 2020-11-07 (×2): 3 [IU] via SUBCUTANEOUS
  Administered 2020-11-07: 2 [IU] via SUBCUTANEOUS

## 2020-11-06 NOTE — ED Triage Notes (Signed)
Patient BIB EMS for recurrent UTI.  Family was concerned that patient had UTI because he gets them frequently and had foul smelling urine.  Patient incontinent and wears depends at all times.  No pain at this time.  States it only hurts when he urinates.

## 2020-11-06 NOTE — ED Notes (Signed)
  Patient BIB EMS for recurrent UTI.  Family was concerned that patient had UTI because he gets them frequently and had foul smelling urine.  Patient incontinent and wears depends at all times.  No pain at this time.  States it only hurts when he urinates.

## 2020-11-06 NOTE — ED Notes (Signed)
Pt via carelink to [facility]. Pt's belongings went home with his wife.

## 2020-11-06 NOTE — ED Provider Notes (Addendum)
[facility] EMERGENCY DEPT Provider Note   CSN: [id number] Arrival date & time: 11/06/20  8588     History Chief Complaint  Patient presents with  . Urinary Tract Infection    Tommy Peterson is a 80 y.o. male.  Patient sent to the emergency department from home by ambulance.  Patient reports that his "stomach is sore".  EMS reports that family called them tonight because he has a history of frequent urinary tract infections and he has been exhibiting urinary frequency with foul-smelling urine.  He was reportedly "warm" and family administered Tylenol.        Past Medical History:  Diagnosis Date  . Acute respiratory failure ([place])   . BCC (basal cell carcinoma of skin) 08/15/2016   w/SCC Left Forehead (treatment @ [facility])  . BPH (benign prostatic hyperplasia)   . Carpal tunnel syndrome on both sides   . Chronic diarrhea    intermittant due to chronic collagenous colitis  . 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 Collagenous colitis    chronic--- intermittant between diarrahea and constipation  . ED (erectile dysfunction)   . HCAP (healthcare-associated pneumonia)   . History of adenomatous polyp of colon   . History of Clostridium difficile colitis 05/2016  . History of pneumonia 11/08/2017   CAP -- LLL---  12-19-2017  per pt no residual symptoms  . History of SCC (squamous cell carcinoma) of skin   . History of sepsis    11-20-2017 severe sepsis due to UTI;  10/ 2017sepsis due to c-diff colitis  . Leucocytosis    chronic  . Lower urinary tract symptoms (LUTS)   . Macrocytic anemia    since 02/ 2015  . MGUS (monoclonal gammopathy of unknown  significance)    hemotologist-  [name] @ [facility]  . Monoclonal paraproteinemia   . Neuropathy, peripheral   . Nodular basal cell carcinoma (BCC) 10/01/2018   Left Neck(Nodular) Pt Does Not Want Treatment  . Nodular basal cell carcinoma (BCC) 10/01/2018   Mid Forehead (treament curet and excision)  . OA (osteoarthritis)   . Prostatic stone   . Rash of face    RIGHT SIDE  . Raynaud's syndrome   . Recurrent BCC (basal cell carcinoma) 03/05/2019   positive margin  . SCC (squamous cell carcinoma) 08/10/2009   Central Forehead (Moh's Dr. Rhona Raider @ [facility])  . SCC (squamous cell carcinoma) 12/08/2011   CIS-Left Cheek (treatment Ald[place] @ [facility])  . SCC (squamous cell carcinoma) 07/09/2015   CIS-Left Forehead (treament @ [facility])  . Septic shock (HCC)    x 3 w/UTI  . Squamous cell carcinoma in situ (SCCIS) 12/08/2011   Left Cheek (treatment Aldara @ [facility])   . Superficial basal cell carcinoma (BCC) 10/01/2018   Left Shin-Pt does not want treatment  . Thin skin    fragile  . Transfusion-dependent anemia since 10/ 2017   last transfusion PRBCs 07-19-2017  per hemologist note dated 10-25-2017  . Type 2 diabetes mellitus treated with insulin ([place])    followed by [name] (pcp)  . Urinary hesitancy   . Wears contact lenses    LEFT EYE ONLY    Patient Active Problem List  Diagnosis Date Noted  . Chronic anticoagulation 10/11/2020  . Persistent atrial fibrillation ([place])   . Acute hypoxemic respiratory failure due to COVID-19 ([place]) 04/07/2020  . Pressure injury of skin 09/02/2018  . UTI (urinary tract infection) 09/01/2018  . Sepsis ([place]) 07/28/2018  . CAP (community acquired pneumonia) 07/28/2018  . Paroxysmal atrial fibrillation ([place]) 04/10/2018  . Aortic atherosclerosis ([place]) 04/08/2018  . DM type 2 (diabetes mellitus, type 2) ([place]) 02/19/2018  . Acute respiratory failure with hypoxia ([place]) 02/18/2018  . MGUS (monoclonal gammopathy of unknown significance)  11/23/2017  . BPH (benign prostatic hyperplasia) 11/23/2017  . Left lower lobe pneumonia 11/20/2017  . Nausea vomiting and diarrhea 11/20/2017  . Leukemia not having achieved remission ([place]) 12/26/2016  . Anemia 12/26/2016  . Large granular lymphocytic leukemia ([place]) 12/26/2016  . Diabetes mellitus with complication ([place])   . Macrocytic anemia   . DOE (dyspnea on exertion)   . Anxiety   . History of GI bleed   . C. difficile colitis 05/31/2016  . Lactic acidosis   . History of bone marrow biopsy 05/27/2016  . Fever 05/27/2016  . Encephalopathy in sepsis 05/27/2016  . Chest pain 05/04/2016  . Benign fibroma of prostate 09/20/2015  . HLD (hyperlipidemia) 09/20/2015  . Essential hypertension 09/20/2015  . Arthritis, degenerative 09/20/2015  . Colitis, collagenous 09/09/2015  . Absolute anemia 09/08/2014  . Adenoma of large intestine 07/13/2014  . Disturbance of skin sensation 01/09/2014  . Recurrent major depressive disorder, in full remission ([place]) 07/18/2012  . Allergic rhinitis 08/29/2011    Past Surgical History:  Procedure Laterality Date  . BONE MARROW BIOPSY Right 05-24-2016;  08-20-2014  . CARDIOVASCULAR STRESS TEST  11-26-2012   [name]  @ [facility] ([place]   normal nuclear study w/ no ischemia/  normal LV function and wall motion , ef 60% (find in care everywhere,epic)  . CARDIOVERSION N/A 08/31/2020   Procedure: CARDIOVERSION;  Surgeon: [name], MD;  Location: [facility];  Service: Cardiovascular;  Laterality: N/A;  . CATARACT EXTRACTION W/ INTRAOCULAR LENS  IMPLANT, BILATERAL  2015  . CYSTOSCOPY WITH LITHOLAPAXY N/A 12/25/2017   Procedure: CYSTOSCOPY WITH LITHOLAPAXY AND FULGERATION;  Surgeon: [name], MD;  Location: [facility];  Service: Urology;  Laterality: N/A;  . INGUINAL HERNIA REPAIR Right 1980s  . LOOP RECORDER INSERTION N/A 04/10/2018   Procedure: LOOP RECORDER INSERTION;  Surgeon: [name], MD;  Location: [facility] CV LAB;  Service: Cardiovascular;  Laterality: N/A;  . PENILE PROSTHESIS IMPLANT  12-02-2015   dr [name] @ [facility] in [place]  . SHOULDER SURGERY Left 08/2016  . TEE WITHOUT CARDIOVERSION N/A 08/31/2020   Procedure: TRANSESOPHAGEAL ECHOCARDIOGRAM (TEE);  Surgeon: [name], MD;  Location: [facility];  Service: Cardiovascular;  Laterality: N/A;  . TONSILLECTOMY  child  . TRANSURETHRAL RESECTION OF PROSTATE  2009   AND REPAIR RECURRENT RIGHT INGUINAL HERNIA       Family History  Problem Relation Age of Onset  . Diabetes Mother   . Heart failure Mother     Social History   Tobacco Use  . Smoking status: Former Smoker    Years: 20.00    Types: Cigarettes    Quit date: 05/04/1996    Years since quitting: 24.5  . Smokeless tobacco: Never Used  Vaping Use  . Vaping Use: Never used  Substance Use Topics  . Alcohol use: Yes    Comment: 1-2 glasses of wine every other  day  . Drug use: No    Home Medications Prior to Admission medications   Medication Sig Start Date End Date Taking? Authorizing Provider  amiodarone (PACERONE) 200 MG tablet Take 1 tablet (200 mg total) by mouth daily. 09/29/20   [facility], Tami Lin, PA  apixaban (ELIQUIS) 5 MG TABS tablet Take 1 tablet (5 mg total) by mouth 2 (two) times daily. 08/18/20   Croitoru, Mihai, MD  b complex vitamins capsule Take 1 capsule by mouth every evening.    [provider]  BD PEN NEEDLE NANO 2ND GEN 32G X 4 MM MISC  07/22/20   [provider]  budesonide (ENTOCORT EC) 3 MG 24 hr capsule Take 9 mg by mouth daily.    [provider]  Cranberry 500 MG TABS Take 500 mg by mouth daily.    [provider]  Cyanocobalamin (VITAMIN B-12) 5000 MCG SUBL Place 5,000 mcg under the tongue daily.    [provider]  D-Mannose 500 MG CAPS Take 500 mg by mouth in the morning and at bedtime.    [provider]  folic acid (FOLVITE) 1 MG tablet TAKE 1 TABLET BY  MOUTH EVERY DAY Patient taking differently: Take 1 mg by mouth daily. 07/23/20   Brunetta Genera, MD  Insulin Glargine-Lixisenatide 100-33 UNT-MCG/ML SOPN Inject 26 Units into the skin at bedtime. Soliqua 100/33    [provider]  methotrexate (RHEUMATREX) 2.5 MG tablet Take 10 mg by mouth once a week. Caution:Chemotherapy. Protect from light.    [provider]  metoprolol tartrate (LOPRESSOR) 50 MG tablet Take 50 mg by mouth 2 (two) times daily.    [provider]  Misc Natural Products (DAILY HERBS MEMORY PO) Take 300 mg by mouth daily. [employer] Neuro-ps    [provider]  Misc Natural Products (PROSTATE HEALTH PO) Take 1 capsule by mouth every evening. Prostate Plus Health Complex    [provider]  NOVOLOG FLEXPEN 100 UNIT/ML FlexPen Inject 6 Units into the skin 3 (three) times daily with meals. 02/21/19   [provider]  oxybutynin (DITROPAN) 5 MG tablet Take 5 mg by mouth daily as needed (Too slow down urine).    [provider]  pioglitazone (ACTOS) 15 MG tablet Take 15 mg by mouth every morning.  10/18/17   [provider]  traMADol (ULTRAM) 50 MG tablet Take 50 mg by mouth at bedtime as needed (restlessness).    [provider]    Allergies    Bee venom, Metformin and related, and Sulfamethoxazole-trimethoprim  Review of Systems   Review of Systems  Gastrointestinal: Positive for abdominal pain.  Genitourinary: Positive for frequency.  All other systems reviewed and are negative.   Physical Exam Updated Vital Signs BP (!) 127/55   Pulse 81   Temp 99.8 F (37.7 C) (Oral)   Resp (!) 25   Ht $R'5\' 9"'SG$  (1.753 m)   Wt 72.6 kg   SpO2 92%   BMI 23.63 kg/m   Physical Exam Vitals and nursing note reviewed.  Constitutional:      General: He is not in acute distress.    Appearance: Normal appearance. He is well-developed.  HENT:     Head: Normocephalic and atraumatic.     Right Ear:  Hearing normal.     Left Ear: Hearing normal.     Nose: Nose normal.  Eyes:     Conjunctiva/sclera: Conjunctivae normal.     Pupils: Pupils are equal, round,  and reactive to light.  Cardiovascular:     Rate and Rhythm: Regular rhythm.     Heart sounds: S1 normal and S2 normal. No murmur heard. No friction rub. No gallop.   Pulmonary:     Effort: Tachypnea and accessory muscle usage present.     Breath sounds: Decreased breath sounds and rales present.  Chest:     Chest wall: No tenderness.  Abdominal:     General: Bowel sounds are normal.     Palpations: Abdomen is soft.     Tenderness: There is no abdominal tenderness. There is no guarding or rebound. Negative signs include Murphy's sign and McBurney's sign.     Hernia: No hernia is present.  Musculoskeletal:        General: Normal range of motion.     Cervical back: Normal range of motion and neck supple.     Right lower leg: 1+ Pitting Edema present.     Left lower leg: 1+ Pitting Edema present.  Skin:    General: Skin is warm and dry.     Findings: No rash.  Neurological:     Mental Status: He is alert and oriented to person, place, and time.     GCS: GCS eye subscore is 4. GCS verbal subscore is 5. GCS motor subscore is 6.     Cranial Nerves: No cranial nerve deficit.     Sensory: No sensory deficit.     Coordination: Coordination normal.  Psychiatric:        Speech: Speech normal.        Behavior: Behavior normal.        Thought Content: Thought content normal.     ED Results / Procedures / Treatments   Labs (all labs ordered are listed, but only abnormal results are displayed) Labs Reviewed  COMPREHENSIVE METABOLIC PANEL - Abnormal; Notable for the following components:      Result Value   Glucose, Bld 214 (*)    Creatinine, Ser 0.54 (*)    Calcium 8.3 (*)    Total Protein 5.8 (*)    Albumin 3.4 (*)    ALT 84 (*)    All other components within normal limits  CBC WITH DIFFERENTIAL/PLATELET - Abnormal;  Notable for the following components:   WBC 16.7 (*)    RBC 2.39 (*)    Hemoglobin 7.5 (*)    HCT 22.6 (*)    RDW 19.9 (*)    Neutro Abs 11.2 (*)    Monocytes Absolute 1.8 (*)    Abs Immature Granulocytes 0.22 (*)    All other components within normal limits  URINALYSIS, ROUTINE W REFLEX MICROSCOPIC - Abnormal; Notable for the following components:   APPearance HAZY (*)    Nitrite POSITIVE (*)    Leukocytes,Ua LARGE (*)    WBC, UA >50 (*)    Bacteria, UA MANY (*)    All other components within normal limits  PROTIME-INR - Abnormal; Notable for the following components:   Prothrombin Time 19.1 (*)    INR 1.7 (*)    All other components within normal limits  APTT - Abnormal; Notable for the following components:   aPTT 40 (*)    All other components within normal limits  BRAIN NATRIURETIC PEPTIDE - Abnormal; Notable for the following components:   B Natriuretic Peptide 389.9 (*)    All other components within normal limits  RESP PANEL BY RT-PCR (FLU A&B, COVID) ARPGX2  CULTURE, BLOOD (SINGLE)  URINE CULTURE  LACTIC  ACID, PLASMA  LACTIC ACID, PLASMA    EKG None  Radiology DG Chest Port 1 View  Result Date: 11/06/2020 CLINICAL DATA:  Questionable sepsis EXAM: PORTABLE CHEST 1 VIEW COMPARISON:  04/17/2020 FINDINGS: Cardiomegaly and generalized interstitial prominence with a few Kerley lines. Asymmetric opacity at the left base with mild elevation of the left diaphragm. Implantable loop recorder. No pneumothorax. IMPRESSION: Cardiomegaly and mild pulmonary edema. Asymmetric density at the left base shows volume loss and is presumably atelectasis. Pneumonia is not excluded in the setting of possible sepsis. Electronically Signed   By: Monte Fantasia M.D.   On: 11/06/2020 05:02    Procedures Procedures   Medications Ordered in ED Medications  cefTRIAXone (ROCEPHIN) 1 g in sodium chloride 0.9 % 100 mL IVPB (1 g Intravenous New Bag/Given 11/06/20 0529)  azithromycin (ZITHROMAX)  500 mg in sodium chloride 0.9 % 250 mL IVPB (has no administration in time range)    ED Course  I have reviewed the triage vital signs and the nursing notes.  Pertinent labs & imaging results that were available during my care of the patient were reviewed by me and considered in my medical decision making (see chart for details).    MDM Rules/Calculators/A&P                          Patient brought to the emergency department for evaluation of fever, shortness of breath and urinary frequency.  Patient with history of leukemia and recurrent symptomatic anemia requiring frequent transfusions.  Wife reports that she has noticed that he has been having difficulty breathing tonight.  Patient reports that he has noticed that his ankles are swollen.  He does not use oxygen at home, room air oxygen saturation right at 90% without any exertion here in the department.  Certainly would drop with any exertion as he seems somewhat short of breath just talking.  Placed on supplemental oxygen.  Will be given Lasix.  Chest x-ray cannot rule out pneumonia, will treat for possible pneumonia as well.  Urinalysis shows clear signs of infection.  Cultures pending.  Will require hospitalization for further management.  Final Clinical Impression(s) / ED Diagnoses Final diagnoses:  Urinary tract infection without hematuria, site unspecified  Acute congestive heart failure, unspecified heart failure type [facility])    Rx / DC Orders ED Discharge Orders    None       Orpah Greek, MD 11/06/20 [id number]    Orpah Greek, MD 11/06/20 [phone]

## 2020-11-06 NOTE — Progress Notes (Signed)
  Echocardiogram 2D Echocardiogram has been performed.  Tommy Peterson G Tommy Peterson 11/06/2020, 11:45 AM

## 2020-11-06 NOTE — H&P (Addendum)
History and Physical    Tommy Peterson:[id number] DOB: Feb 04, 1941 DOA: 11/06/2020  PCP: [facility]  Patient coming from: Home  Chief Complaint: "I was out of it. My wife made me come."  HPI: Tommy Peterson is a 80 y.o. male with medical history significant of DM2, HTN, afib, CLL. Presenting with increasing dyspnea and urinary frequency. His wife was concerned that over the past day he's looked like his is struggling to breath. He states that he noticed the same and that he ankles were swelling as well. He reports that just in general he's felt bad for the last month. He hasn't pursued medical attention because he says "my doctor can't do anything for me." He denies trying any treatments. He states nothing has made his symptoms better. Activity seems to make things worse. He states he's been "out of it" for the past couple of days and that his wife finally made him come to the ED. He denies any other aggravating or alleviating factors.   ED Course: He was found to UTI and possible PNA. Started on abx. He was found to have an elevated BNP and a CXR that showed mild pulm edema. He was started on lasix. TRH was called for admission.   Review of Systems:  Denies CP, palpitations, N/V, fever, sick contacts. Reports dyspnea, diarrhea. Review of systems is otherwise negative for all not mentioned in HPI.   PMHx Past Medical History:  Diagnosis Date  . Acute respiratory failure ([place])   . BCC (basal cell carcinoma of skin) 08/15/2016   w/SCC Left Forehead (treatment @ [facility])  . BPH (benign prostatic hyperplasia)   . Carpal tunnel syndrome on both sides   . Chronic diarrhea    intermittant due to chronic collagenous colitis  . 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 Collagenous colitis    chronic--- intermittant between diarrahea and constipation  . ED (erectile dysfunction)   . HCAP (healthcare-associated pneumonia)   . History of adenomatous polyp of colon   . History of Clostridium difficile colitis 05/2016  . History of pneumonia 11/08/2017   CAP -- LLL---  12-19-2017  per pt no residual symptoms  . History of SCC (squamous cell carcinoma) of skin   . History of sepsis    11-20-2017 severe sepsis due to UTI;  10/ 2017sepsis due to c-diff colitis  . Leucocytosis    chronic  . Lower urinary tract symptoms (LUTS)   . Macrocytic anemia    since 02/ 2015  . MGUS (monoclonal gammopathy of unknown significance)    hemotologist-  [name] @ [facility]  . Monoclonal paraproteinemia   . Neuropathy, peripheral   . Nodular basal cell carcinoma (BCC) 10/01/2018   Left Neck(Nodular) Pt Does Not Want Treatment  . Nodular basal cell carcinoma (BCC) 10/01/2018   Mid Forehead (treament curet and excision)  . OA (osteoarthritis)   . Prostatic stone   . Rash of face    RIGHT SIDE  . Raynaud's syndrome   . Recurrent BCC (basal cell carcinoma) 03/05/2019   positive margin  . SCC (squamous cell carcinoma) 08/10/2009   Central Forehead (Moh's Dr. Rhona Raider @ [facility])  . SCC (squamous cell carcinoma) 12/08/2011  CIS-Left Cheek (treatment Aldara @ [facility])  . SCC (squamous cell carcinoma) 07/09/2015   CIS-Left Forehead (treament @ [facility])  . Septic shock (HCC)    x 3 w/UTI  . Squamous cell carcinoma in situ (SCCIS) 12/08/2011   Left Cheek (treatment Aldara @ [facility])   . Superficial basal cell carcinoma (BCC) 10/01/2018   Left Shin-Pt does not want treatment  . Thin skin    fragile  . Transfusion-dependent anemia since 10/ 2017   last transfusion PRBCs 07-19-2017  per hemologist note dated 10-25-2017  . Type 2 diabetes mellitus  treated with insulin ([place])    followed by [name] (pcp)  . Urinary hesitancy   . Wears contact lenses    LEFT EYE ONLY    PSHx Past Surgical History:  Procedure Laterality Date  . BONE MARROW BIOPSY Right 05-24-2016;  08-20-2014  . CARDIOVASCULAR STRESS TEST  11-26-2012   [name]  @ [place] ([place]   normal nuclear study w/ no ischemia/  normal LV function and wall motion , ef 60% (find in care everywhere,epic)  . CARDIOVERSION N/A 08/31/2020   Procedure: CARDIOVERSION;  Surgeon: [name], MD;  Location: [place];  Service: Cardiovascular;  Laterality: N/A;  . CATARACT EXTRACTION W/ INTRAOCULAR LENS  IMPLANT, BILATERAL  2015  . CYSTOSCOPY WITH LITHOLAPAXY N/A 12/25/2017   Procedure: CYSTOSCOPY WITH LITHOLAPAXY AND FULGERATION;  Surgeon: [name], MD;  Location: [facility];  Service: Urology;  Laterality: N/A;  . INGUINAL HERNIA REPAIR Right 1980s  . LOOP RECORDER INSERTION N/A 04/10/2018   Procedure: LOOP RECORDER INSERTION;  Surgeon: [name], MD;  Location: [facility];  Service: Cardiovascular;  Laterality: N/A;  . PENILE PROSTHESIS IMPLANT  12-02-2015   dr [name] @ [facility] in [place]  . SHOULDER SURGERY Left 08/2016  . TEE WITHOUT CARDIOVERSION N/A 08/31/2020   Procedure: TRANSESOPHAGEAL ECHOCARDIOGRAM (TEE);  Surgeon: [name], MD;  Location: [place];  Service: Cardiovascular;  Laterality: N/A;  . TONSILLECTOMY  child  . TRANSURETHRAL RESECTION OF PROSTATE  2009   AND REPAIR RECURRENT RIGHT INGUINAL HERNIA    SocHx  reports that he quit smoking about 24 years ago. His smoking use included cigarettes. He quit after 20.00 years of use. He has never used smokeless tobacco. He reports current alcohol use. He reports that he does not use drugs.  Allergies  Allergen Reactions  . Bee Venom Swelling and Anaphylaxis  . Metformin And Related Nausea And Vomiting  . Sulfamethoxazole-Trimethoprim Nausea  And Vomiting    FamHx Family History  Problem Relation Age of Onset  . Diabetes Mother   . Heart failure Mother     Prior to Admission medications   Medication Sig Start Date End Date Taking? Authorizing Provider  amiodarone (PACERONE) 200 MG tablet Take 1 tablet (200 mg total) by mouth daily. 09/29/20   [facility], Tami Lin, PA  apixaban (ELIQUIS) 5 MG TABS tablet Take 1 tablet (5 mg total) by mouth 2 (two) times daily. 08/18/20   Croitoru, Mihai, MD  b complex vitamins capsule Take 1 capsule by mouth every evening.    [provider]  BD PEN NEEDLE NANO 2ND GEN 32G X 4 MM MISC  07/22/20   [provider]  budesonide (ENTOCORT EC) 3 MG 24 hr capsule Take 9 mg by mouth daily.    [provider]  Cranberry 500 MG TABS Take 500 mg by mouth daily.  [provider]  Cyanocobalamin (VITAMIN B-12) 5000 MCG SUBL Place 5,000 mcg under the tongue daily.    [provider]  D-Mannose 500 MG CAPS Take 500 mg by mouth in the morning and at bedtime.    [provider]  folic acid (FOLVITE) 1 MG tablet TAKE 1 TABLET BY MOUTH EVERY DAY Patient taking differently: Take 1 mg by mouth daily. 07/23/20   Brunetta Genera, MD  Insulin Glargine-Lixisenatide 100-33 UNT-MCG/ML SOPN Inject 26 Units into the skin at bedtime. Soliqua 100/33    [provider]  methotrexate (RHEUMATREX) 2.5 MG tablet Take 10 mg by mouth once a week. Caution:Chemotherapy. Protect from light.    [provider]  metoprolol tartrate (LOPRESSOR) 50 MG tablet Take 50 mg by mouth 2 (two) times daily.    [provider]  Misc Natural Products (DAILY HERBS MEMORY PO) Take 300 mg by mouth daily. [employer] [facility]-ps    [provider]  Misc Natural Products (PROSTATE HEALTH PO) Take 1 capsule by mouth every evening. Prostate Plus Health Complex    [provider]  NOVOLOG FLEXPEN 100 UNIT/ML FlexPen Inject 6 Units into the skin 3 (three)  times daily with meals. 02/21/19   [provider]  oxybutynin (DITROPAN) 5 MG tablet Take 5 mg by mouth daily as needed (Too slow down urine).    [provider]  pioglitazone (ACTOS) 15 MG tablet Take 15 mg by mouth every morning.  10/18/17   [provider]  traMADol (ULTRAM) 50 MG tablet Take 50 mg by mouth at bedtime as needed (restlessness).    [provider]    Physical Exam: Vitals:   11/06/20 0700 11/06/20 0715 11/06/20 0756 11/06/20 0758  BP: (!) 114/49 (!) 117/52 (!) 116/52   Pulse: 66 65 66   Resp: (!) $RemoveB'21 17 19   'TQHNzSbD$ Temp:    99.4 F (37.4 C)  TempSrc:    Oral  SpO2: 95% 96% 97%   Weight:      Height:        General: 80 y.o. male resting in bed in NAD Eyes: PERRL, normal sclera ENMT: Nares patent w/o discharge, orophaynx clear, dentition normal, ears w/o discharge/lesions/ulcers Neck: Supple, trachea midline Cardiovascular: RRR, +S1, S2, no m/g/r, equal pulses throughout Respiratory: good air movement, somewhat decreased in left base, no w/r/r, normal WOB GI: BS+, NDNT, no masses noted, no organomegaly noted MSK: No c/c; trace b/l ankle edema Skin: No rashes, bruises, ulcerations noted Neuro: A&O x 3, no focal deficits Psyc: Appropriate interaction and affect, calm/cooperative  Labs on Admission: I have personally reviewed following labs and imaging studies  CBC: Recent Labs  Lab 11/01/20 1710 11/03/20 0948 11/06/20 0431  WBC 12.3* 11.0* 16.7*  NEUTROABS  --  4.8 11.2*  HGB 5.9* 5.5* 7.5*  HCT 17.9* 16.9* 22.6*  MCV 93 93.9 94.6  PLT 356 348 188   Basic Metabolic Panel: Recent Labs  Lab 11/01/20 1710 11/03/20 0948 11/06/20 0431  NA 139 139 137  K 4.4 4.2 4.7  CL 103 106 105  CO2 $Re'22 25 24  'fER$ GLUCOSE 274* 234* 214*  BUN $Re'11 15 15  'MiE$ CREATININE 0.57* 0.63 0.54*  CALCIUM 8.6 8.1* 8.3*   GFR: Estimated Creatinine Clearance: 73.6 mL/min (A) (by C-G formula based on SCr of 0.54 mg/dL (L)). Liver Function Tests: Recent  Labs  Lab 11/03/20 0948 11/06/20 0431  AST 22 35  ALT 41 84*  ALKPHOS 100 84  BILITOT 0.9 1.1  PROT 6.1* 5.8*  ALBUMIN 3.3* 3.4*   No results for input(s): LIPASE, AMYLASE in the last 168 hours. No results for input(s): AMMONIA in the last 168 hours. Coagulation Profile: Recent Labs  Lab 11/06/20 0450  INR 1.7*   Cardiac Enzymes: No results for input(s): CKTOTAL, CKMB, CKMBINDEX, TROPONINI in the last 168 hours. BNP (last 3 results) No results for input(s): PROBNP in the last 8760 hours. HbA1C: No results for input(s): HGBA1C in the last 72 hours. CBG: Recent Labs  Lab 11/06/20 0815  GLUCAP 194*   Lipid Profile: No results for input(s): CHOL, HDL, LDLCALC, TRIG, CHOLHDL, LDLDIRECT in the last 72 hours. Thyroid Function Tests: No results for input(s): TSH, T4TOTAL, FREET4, T3FREE, THYROIDAB in the last 72 hours. Anemia Panel: No results for input(s): VITAMINB12, FOLATE, FERRITIN, TIBC, IRON, RETICCTPCT in the last 72 hours. Urine analysis:    Component Value Date/Time   COLORURINE YELLOW 11/06/2020 0431   APPEARANCEUR HAZY (A) 11/06/2020 0431   LABSPEC 1.018 11/06/2020 0431   PHURINE 5.0 11/06/2020 0431   GLUCOSEU NEGATIVE 11/06/2020 0431   HGBUR NEGATIVE 11/06/2020 0431   BILIRUBINUR NEGATIVE 11/06/2020 0431   BILIRUBINUR negative 10/29/2017 1428   KETONESUR NEGATIVE 11/06/2020 0431   PROTEINUR NEGATIVE 11/06/2020 0431   UROBILINOGEN 0.2 10/29/2017 1428   NITRITE POSITIVE (A) 11/06/2020 0431   LEUKOCYTESUR LARGE (A) 11/06/2020 0431    Radiological Exams on Admission: DG Chest Port 1 View  Result Date: 11/06/2020 CLINICAL DATA:  Questionable sepsis EXAM: PORTABLE CHEST 1 VIEW COMPARISON:  04/17/2020 FINDINGS: Cardiomegaly and generalized interstitial prominence with a few Kerley lines. Asymmetric opacity at the left base with mild elevation of the left diaphragm. Implantable loop recorder. No pneumothorax. IMPRESSION: Cardiomegaly and mild pulmonary edema.  Asymmetric density at the left base shows volume loss and is presumably atelectasis. Pneumonia is not excluded in the setting of possible sepsis. Electronically Signed   By: Monte Fantasia M.D.   On: 11/06/2020 05:02    EKG: Independently reviewed. NSR, no st elevations  Assessment/Plan UTI Possible LLL PNA     - place in obs, tele     - continue azithro, rocephin     - check urine legionella/strep     - UCx pending     - he's stable on RA  Mild pulmonary edema     - was given OT dose lasix in ED; resp status is improved     - BNP was elevated at 389.9     - echo in 08/20/20 shows EF 55-50% w/ indeterminate diastolic fxn; TEE on 5/36/64 shows the same     - he denies CP, he reports some minor ankle swelling (trace amt seen on exam)     - currently comfortable on 2L satting 100%, wean O2     - he's currently in sinus, let's get rpt echo to r/o an acute HF     - for now, continue lasix as PO 40 mg qday; continue outpt cardiology follow up  persistent a fib     - in sinus now     - continue amiodarone and eliquis once confirmed  HTN     - continue home regimen once confirmed  DM2     - SSI, DM diet, glucose checks, A1c  Normocytic anemia     - no signs of bleed     - Hgb improved from 3/30; follow  Elevated LFTs Elevated INR     - check hepatitis panel, RUQ [place]     -  rpt INR     - no evidence of bleed, follow  UPDATE: Continued fevers in the afternoon. Awaiting UCx and Bld Cx. I see that only a single draw bld Cx was performed. Will place orders for a 2 draw bld Cx. At this point, having had abx already will likely affect this result.   DVT prophylaxis: lovenox  Code Status: FULL  Family Communication: attempted call to spouse and dtr at all 3 numbers listed; received VM only  Consults called: None   Status is: Observation  The patient remains OBS appropriate and will d/c before 2 midnights.  Dispo: The patient is from: Home              Anticipated d/c is to:  Home              Patient currently is not medically stable to d/c.   Difficult to place patient No  Jonnie Finner DO [facility]  If 7PM-7AM, please contact night-coverage [email or web address]  11/06/2020, 8:57 AM

## 2020-11-06 NOTE — Plan of Care (Signed)
Patient with recurrent fevers, T max 102.1 on 7 a to 7 p shift.  Patient with poor appetite, will drink Ensure.  Daughter and wife at bedside.

## 2020-11-07 DIAGNOSIS — B9620 Unspecified Escherichia coli [E. coli] as the cause of diseases classified elsewhere: Secondary | ICD-10-CM | POA: Diagnosis present

## 2020-11-07 DIAGNOSIS — Z794 Long term (current) use of insulin: Secondary | ICD-10-CM | POA: Diagnosis not present

## 2020-11-07 DIAGNOSIS — K900 Celiac disease: Secondary | ICD-10-CM | POA: Diagnosis present

## 2020-11-07 DIAGNOSIS — C9110 Chronic lymphocytic leukemia of B-cell type not having achieved remission: Secondary | ICD-10-CM | POA: Diagnosis not present

## 2020-11-07 DIAGNOSIS — Z8249 Family history of ischemic heart disease and other diseases of the circulatory system: Secondary | ICD-10-CM | POA: Diagnosis not present

## 2020-11-07 DIAGNOSIS — E8770 Fluid overload, unspecified: Secondary | ICD-10-CM

## 2020-11-07 DIAGNOSIS — Z87891 Personal history of nicotine dependence: Secondary | ICD-10-CM | POA: Diagnosis not present

## 2020-11-07 DIAGNOSIS — N390 Urinary tract infection, site not specified: Secondary | ICD-10-CM | POA: Diagnosis not present

## 2020-11-07 DIAGNOSIS — Z85828 Personal history of other malignant neoplasm of skin: Secondary | ICD-10-CM | POA: Diagnosis not present

## 2020-11-07 DIAGNOSIS — Z20822 Contact with and (suspected) exposure to covid-19: Secondary | ICD-10-CM | POA: Diagnosis not present

## 2020-11-07 DIAGNOSIS — I4819 Other persistent atrial fibrillation: Secondary | ICD-10-CM | POA: Diagnosis not present

## 2020-11-07 DIAGNOSIS — I5033 Acute on chronic diastolic (congestive) heart failure: Secondary | ICD-10-CM | POA: Diagnosis present

## 2020-11-07 DIAGNOSIS — K529 Noninfective gastroenteritis and colitis, unspecified: Secondary | ICD-10-CM | POA: Diagnosis present

## 2020-11-07 DIAGNOSIS — J9601 Acute respiratory failure with hypoxia: Secondary | ICD-10-CM | POA: Diagnosis not present

## 2020-11-07 DIAGNOSIS — Z833 Family history of diabetes mellitus: Secondary | ICD-10-CM | POA: Diagnosis not present

## 2020-11-07 DIAGNOSIS — R791 Abnormal coagulation profile: Secondary | ICD-10-CM | POA: Diagnosis present

## 2020-11-07 DIAGNOSIS — G9341 Metabolic encephalopathy: Secondary | ICD-10-CM | POA: Diagnosis not present

## 2020-11-07 DIAGNOSIS — E119 Type 2 diabetes mellitus without complications: Secondary | ICD-10-CM | POA: Diagnosis not present

## 2020-11-07 DIAGNOSIS — I110 Hypertensive heart disease with heart failure: Secondary | ICD-10-CM | POA: Diagnosis not present

## 2020-11-07 DIAGNOSIS — D649 Anemia, unspecified: Secondary | ICD-10-CM | POA: Diagnosis present

## 2020-11-07 DIAGNOSIS — R7989 Other specified abnormal findings of blood chemistry: Secondary | ICD-10-CM | POA: Diagnosis present

## 2020-11-07 DIAGNOSIS — Z7901 Long term (current) use of anticoagulants: Secondary | ICD-10-CM | POA: Diagnosis not present

## 2020-11-07 DIAGNOSIS — Z79899 Other long term (current) drug therapy: Secondary | ICD-10-CM | POA: Diagnosis not present

## 2020-11-07 LAB — COMPREHENSIVE METABOLIC PANEL
ALT: 68 U/L — ABNORMAL HIGH (ref 0–44)
AST: 24 U/L (ref 15–41)
Albumin: 2.9 g/dL — ABNORMAL LOW (ref 3.5–5.0)
Alkaline Phosphatase: 77 U/L (ref 38–126)
Anion gap: 7 (ref 5–15)
BUN: 13 mg/dL (ref 8–23)
CO2: 24 mmol/L (ref 22–32)
Calcium: 8.3 mg/dL — ABNORMAL LOW (ref 8.9–10.3)
Chloride: 101 mmol/L (ref 98–111)
Creatinine, Ser: 0.45 mg/dL — ABNORMAL LOW (ref 0.61–1.24)
GFR, Estimated: >60 mL/min (ref >60)
Glucose, Bld: 269 mg/dL — ABNORMAL HIGH (ref 70–99)
Potassium: 3.6 mmol/L (ref 3.5–5.1)
Sodium: 132 mmol/L — ABNORMAL LOW (ref 135–145)
Total Bilirubin: 1.7 mg/dL — ABNORMAL HIGH (ref 0.3–1.2)
Total Protein: 5.3 g/dL — ABNORMAL LOW (ref 6.5–8.1)

## 2020-11-07 LAB — CBC
HCT: 20.8 % — ABNORMAL LOW (ref 39.0–52.0)
Hemoglobin: 6.8 g/dL — CL (ref 13.0–17.0)
MCH: 31.3 pg (ref 26.0–34.0)
MCHC: 32.7 g/dL (ref 30.0–36.0)
MCV: 95.9 fL (ref 80.0–100.0)
Platelets: 240 10*3/uL (ref 150–400)
RBC: 2.17 MIL/uL — ABNORMAL LOW (ref 4.22–5.81)
RDW: 21 % — ABNORMAL HIGH (ref 11.5–15.5)
WBC: 25.7 10*3/uL — ABNORMAL HIGH (ref 4.0–10.5)
nRBC: 0 % (ref 0.0–0.2)

## 2020-11-07 LAB — PREPARE RBC (CROSSMATCH)

## 2020-11-07 LAB — GLUCOSE, CAPILLARY
Glucose-Capillary: 219 mg/dL — ABNORMAL HIGH (ref 70–99)
Glucose-Capillary: 223 mg/dL — ABNORMAL HIGH (ref 70–99)
Glucose-Capillary: 187 mg/dL — ABNORMAL HIGH (ref 70–99)
Glucose-Capillary: 301 mg/dL — ABNORMAL HIGH (ref 70–99)

## 2020-11-07 LAB — LEGIONELLA PNEUMOPHILA SEROGP 1 UR AG: L. pneumophila Serogp 1 Ur Ag: NEGATIVE

## 2020-11-07 LAB — PROTIME-INR
INR: 2.2 — ABNORMAL HIGH (ref 0.8–1.2)
Prothrombin Time: 24.1 seconds — ABNORMAL HIGH (ref 11.4–15.2)

## 2020-11-07 MED ORDER — SODIUM CHLORIDE 0.9% IV SOLUTION: Freq: Once | INTRAVENOUS | Status: AC

## 2020-11-07 MED ORDER — PIOGLITAZONE HCL 15 MG PO TABS
15.0000 mg | ORAL_TABLET | Freq: Every day | ORAL | Status: DC
  Administered 2020-11-07 – 2020-11-08 (×2): 15 mg via ORAL
  Filled 2020-11-07 (×2): qty 1

## 2020-11-07 MED ORDER — POTASSIUM CHLORIDE CRYS ER 20 MEQ PO TBCR
40.0000 meq | EXTENDED_RELEASE_TABLET | Freq: Once | ORAL | Status: AC
  Administered 2020-11-07: 40 meq via ORAL
  Filled 2020-11-07: qty 2

## 2020-11-07 MED ORDER — FUROSEMIDE 40 MG PO TABS
40.0000 mg | ORAL_TABLET | Freq: Two times a day (BID) | ORAL | Status: DC
  Administered 2020-11-07 – 2020-11-08 (×2): 40 mg via ORAL
  Filled 2020-11-07 (×2): qty 1

## 2020-11-07 NOTE — Progress Notes (Signed)
SATURATION QUALIFICATIONS: (This note is used to comply with regulatory documentation for home oxygen)  Patient Saturations on Room Air at Rest = 93%  Patient Saturations on Room Air while Ambulating = 95%  Patient Saturations on n/a Liters of oxygen while Ambulating = n/a  Please briefly explain why patient needs home oxygen: patient does not qualify for home oxygen

## 2020-11-07 NOTE — Plan of Care (Signed)
Patient afebrile on 7 a to 7p shift, mentation improved.  Patient tolerating heart healthy diet, intake much improved.  Maintains oxygen in the 90's on room air.  Wife at bedside.

## 2020-11-07 NOTE — Progress Notes (Signed)
PROGRESS NOTE  Tommy Peterson  RXV:[id number] DOB: Sep 20, 1940 DOA: 11/06/2020 PCP: [facility]  Outpatient Specialists: Cardiology Dr. Sallyanne Kuster; GI Dr. Benson Norway; [facility]  Brief Narrative: Tommy Peterson is an 80 y.o. male with a history of CLL, T2DM, PAF, HTN who presented to the ED with confusion, dyspnea and increased urinary frequency. Urinalysis strongly suggested UTI, CXR with pulmonary edema and possible infectious infiltrate and legs swollen. BNP elevated. WBC 16.7k, hgb 7.5. Antibiotics, lasix were given. 1u PRBCs given the following day for hgb 6.8g/dl.   Assessment & Plan: Active Problems:   Volume overload   Acute on chronic heart failure with preserved ejection fraction (HFpEF) (HCC)  Acute diastolic CHF: LVEF 50-93% with G2DD, no RWMA, small circumferential pericardial effusion (no chest pain).  - Remains overloaded peripherally and with hypoxia and crackles on exam. Will augment lasix to 40mg  BID, especially in light of 1u PRBCs as below. Empiric K x2 this PM w/level 3.6 and ongoing loop diuresis.   - Monitor I/O, daily weights.   UTI: Strongly positive UA with symptoms and hx recurrent UTI. Has seen urology who felt there was "nothing wrong with me."  - Continue ceftriaxone as below - Monitor urine and blood cultures - Hold oxybutynin for now to avoid retention in setting of diuresis. Would consider PVR monitoring and DC medication if retaining with recurrent UTIs.   Acute hypoxic respiratory failure due to acute pulmonary edema from acute HFpEF: Still with exam evidence of pulmonary edema also seen on CXR at admission.  - Continue supplemental oxygen as needed, check ambulatory pulse oximetry. Encourage IS, OOB, PT consulted for assistance and evaluation. - Possible pneumonia also noted on CXR. Will continue doxycycline for now in addition to CTX.   PAF: Followed by Dr. Loletha Grayer. Recent TEE-DCCV canceled due to conversion to sinus rhythm which he is still  maintaining. Pt calls Dx into question.  - Continue amiodarone, metoprolol and eliquis (no active bleeding). - Continue telemetry in setting of diuresis.   Acute metabolic encephalopathy: Improved dramatically with Tx of infection and subsidence of fever.  - Continue monitoring.   CLL with transfusion-dependent anemia:  - Hgb down today, will give 1u PRBCs and monitor H/H.  - Was taken of MTX per pt  Celiac disease: Dx reported by patient given after EGD/colo by Dr. Benson Norway.  - Awaiting referral to GI at [facility] which is scheduled.  - Continue entocort  T2DM: HbA1c 8.3% - Continue SSI.  - Restart pioglitazone  HTN:  - Continue metoprolol  INR elevation: Most likely attributable to eliquis. LFTs stable.   Positive hepatis C antibody:  - Outpatient follow up.   DVT prophylaxis: Eliquis Code Status: Full Family Communication: None at bedside Disposition Plan:  Status is: Observation  The patient will require care spanning > 2 midnights and should be moved to inpatient because: Inpatient level of care appropriate due to severity of illness  Dispo: The patient is from: Home              Anticipated d/c is to: Home              Patient currently is not medically stable to d/c.   Difficult to place patient No  Consultants:   None  Procedures:   None  Antimicrobials:  Ceftriaxone, doxycycline   Subjective: Much more "with it" today per pt and RN. Has gotten to chair but no further. Had lots of UOP yesterday. No chest pain, denies dyspnea at rest but  at baseline only goes about 6 steps. No bleeding. When told hgb is back down he does feel like this is not surprising.   Objective: Vitals:   11/07/20 0100 11/07/20 0500 11/07/20 0638 11/07/20 0726  BP: (!) 106/48 116/64 136/82   Pulse: 62 60 81 60  Resp:   18   Temp: 98.9 F (37.2 C) 98.4 F (36.9 C) 98.7 F (37.1 C) (!) 97.5 F (36.4 C)  TempSrc: Oral Oral Oral Oral  SpO2: 90% 93% 99% 95%  Weight:  75.2 kg     Height:        Intake/Output Summary (Last 24 hours) at 11/07/2020 1016 Last data filed at 11/07/2020 0630 Gross per 24 hour  Intake 680 ml  Output 1000 ml  Net -320 ml   Filed Weights   11/06/20 0434 11/06/20 0924 11/07/20 0500  Weight: 72.6 kg 77.2 kg 75.2 kg    Gen: 80 y.o. male in no distress. Pulm: Non-labored breathing 2L O2, crackles at bases.  CV: Regular rate and rhythm. No murmur, rub, or gallop. UTD JVD, 2+ pitting lower leg and pedal edema. GI: Abdomen soft, non-tender, non-distended, with normoactive bowel sounds. No organomegaly or masses felt. Ext: Warm, no deformities Skin: No rashes, lesions or ulcers Neuro: Alert and oriented. No focal neurological deficits. Psych: Judgement and insight appear normal. Mood & affect appropriate.   Data Reviewed: I have personally reviewed following labs and imaging studies  CBC: Recent Labs  Lab 11/01/20 1710 11/03/20 0948 11/06/20 0431 11/07/20 0328  WBC 12.3* 11.0* 16.7* 25.7*  NEUTROABS  --  4.8 11.2*  --   HGB 5.9* 5.5* 7.5* 6.8*  HCT 17.9* 16.9* 22.6* 20.8*  MCV 93 93.9 94.6 95.9  PLT 356 348 304 751   Basic Metabolic Panel: Recent Labs  Lab 11/01/20 1710 11/03/20 0948 11/06/20 0431 11/07/20 0328  NA 139 139 137 132*  K 4.4 4.2 4.7 3.6  CL 103 106 105 101  CO2 22 25 24 24   GLUCOSE 274* 234* 214* 269*  BUN 11 15 15 13   CREATININE 0.57* 0.63 0.54* 0.45*  CALCIUM 8.6 8.1* 8.3* 8.3*   GFR: Estimated Creatinine Clearance: 74.9 mL/min (A) (by C-G formula based on SCr of 0.45 mg/dL (L)). Liver Function Tests: Recent Labs  Lab 11/03/20 0948 11/06/20 0431 11/07/20 0328  AST 22 35 24  ALT 41 84* 68*  ALKPHOS 100 84 77  BILITOT 0.9 1.1 1.7*  PROT 6.1* 5.8* 5.3*  ALBUMIN 3.3* 3.4* 2.9*   No results for input(s): LIPASE, AMYLASE in the last 168 hours. No results for input(s): AMMONIA in the last 168 hours. Coagulation Profile: Recent Labs  Lab 11/06/20 0450 11/07/20 0328  INR 1.7* 2.2*   Cardiac  Enzymes: No results for input(s): CKTOTAL, CKMB, CKMBINDEX, TROPONINI in the last 168 hours. BNP (last 3 results) No results for input(s): PROBNP in the last 8760 hours. HbA1C: Recent Labs    11/06/20 1028  HGBA1C 8.3*   CBG: Recent Labs  Lab 11/06/20 0815 11/06/20 1057 11/06/20 1614 11/06/20 2126 11/07/20 0723  GLUCAP 194* 133* 189* 173* 219*   Lipid Profile: No results for input(s): CHOL, HDL, LDLCALC, TRIG, CHOLHDL, LDLDIRECT in the last 72 hours. Thyroid Function Tests: No results for input(s): TSH, T4TOTAL, FREET4, T3FREE, THYROIDAB in the last 72 hours. Anemia Panel: No results for input(s): VITAMINB12, FOLATE, FERRITIN, TIBC, IRON, RETICCTPCT in the last 72 hours. Urine analysis:    Component Value Date/Time   COLORURINE YELLOW 11/06/2020 0431  APPEARANCEUR HAZY (A) 11/06/2020 0431   LABSPEC 1.018 11/06/2020 0431   PHURINE 5.0 11/06/2020 0431   GLUCOSEU NEGATIVE 11/06/2020 0431   HGBUR NEGATIVE 11/06/2020 0431   BILIRUBINUR NEGATIVE 11/06/2020 0431   BILIRUBINUR negative 10/29/2017 1428   KETONESUR NEGATIVE 11/06/2020 0431   PROTEINUR NEGATIVE 11/06/2020 0431   UROBILINOGEN 0.2 10/29/2017 1428   NITRITE POSITIVE (A) 11/06/2020 0431   LEUKOCYTESUR LARGE (A) 11/06/2020 0431   Recent Results (from the past 240 hour(s))  SARS CORONAVIRUS 2 (TAT 6-24 HRS) Nasopharyngeal Nasopharyngeal Swab     Status: None   Collection Time: 11/02/20  9:17 AM   Specimen: Nasopharyngeal Swab  Result Value Ref Range Status   SARS Coronavirus 2 NEGATIVE NEGATIVE Final    Comment: (NOTE) SARS-CoV-2 target nucleic acids are NOT DETECTED.  The SARS-CoV-2 RNA is generally detectable in upper and lower respiratory specimens during the acute phase of infection. Negative results do not preclude SARS-CoV-2 infection, do not rule out co-infections with other pathogens, and should not be used as the sole basis for treatment or other patient management decisions. Negative results must be  combined with clinical observations, patient history, and epidemiological information. The expected result is Negative.  Fact Sheet for Patients: SugarRoll.be  Fact Sheet for Healthcare Providers: https://www.woods-mathews.com/  This test is not yet approved or cleared by the [place] FDA and  has been authorized for detection and/or diagnosis of SARS-CoV-2 by FDA under an Emergency Use Authorization (EUA). This EUA will remain  in effect (meaning this test can be used) for the duration of the COVID-19 declaration under Se ction 564(b)(1) of the Act, 21 U.S.C. section 360bbb-3(b)(1), unless the authorization is terminated or revoked sooner.  Performed at [facility], [place] [address] [place], [place] [address]   Urine culture     Status: Abnormal (Preliminary result)   Collection Time: 11/06/20  4:31 AM   Specimen: Urine, Catheterized  Result Value Ref Range Status   Specimen Description   Final    URINE, CATHETERIZED Performed at [facility]    Special Requests NONE Performed at [facility]   Final   Culture (A)  Final    >=100,000 COLONIES/mL GRAM NEGATIVE RODS SUSCEPTIBILITIES TO FOLLOW Performed at [facility], [place] [address] [place], [place] [address]    Report Status PENDING  Incomplete  Resp Panel by RT-PCR (Flu A&B, Covid) Nasopharyngeal Swab     Status: None   Collection Time: 11/06/20  4:35 AM   Specimen: Nasopharyngeal Swab; Nasopharyngeal(NP) swabs in vial transport medium  Result Value Ref Range Status   SARS Coronavirus 2 by RT PCR NEGATIVE NEGATIVE Final    Comment: (NOTE) SARS-CoV-2 target nucleic acids are NOT DETECTED.  The SARS-CoV-2 RNA is generally detectable in upper respiratory specimens during the acute phase of infection. The lowest concentration of SARS-CoV-2 viral copies this assay can detect is 138 copies/mL. A negative result does not preclude  SARS-Cov-2 infection and should not be used as the sole basis for treatment or other patient management decisions. A negative result may occur with  improper specimen collection/handling, submission of specimen other than nasopharyngeal swab, presence of viral mutation(s) within the areas targeted by this assay, and inadequate number of viral copies(<138 copies/mL). A negative result must be combined with clinical observations, patient history, and epidemiological information. The expected result is Negative.  Fact Sheet for Patients:  EntrepreneurPulse.com.au  Fact Sheet for Healthcare Providers:  IncredibleEmployment.be  This test is  no t yet approved or cleared by the [place] and  has been authorized for detection and/or diagnosis of SARS-CoV-2 by FDA under an Emergency Use Authorization (EUA). This EUA will remain  in effect (meaning this test can be used) for the duration of the COVID-19 declaration under Section 564(b)(1) of the Act, 21 U.S.C.section 360bbb-3(b)(1), unless the authorization is terminated  or revoked sooner.       Influenza A by PCR NEGATIVE NEGATIVE Final   Influenza B by PCR NEGATIVE NEGATIVE Final    Comment: (NOTE) The Xpert Xpress SARS-CoV-2/FLU/RSV plus assay is intended as an aid in the diagnosis of influenza from Nasopharyngeal swab specimens and should not be used as a sole basis for treatment. Nasal washings and aspirates are unacceptable for Xpert Xpress SARS-CoV-2/FLU/RSV testing.  Fact Sheet for Patients: EntrepreneurPulse.com.au  Fact Sheet for Healthcare Providers: IncredibleEmployment.be  This test is not yet approved or cleared by the [place] FDA and has been authorized for detection and/or diagnosis of SARS-CoV-2 by FDA under an Emergency Use Authorization (EUA). This EUA will remain in effect (meaning this test can be used) for the duration of  the COVID-19 declaration under Section 564(b)(1) of the Act, 21 U.S.C. section 360bbb-3(b)(1), unless the authorization is terminated or revoked.  Performed at [facility]       Radiology Studies: [facility] Chest Port 1 View  Result Date: 11/06/2020 CLINICAL DATA:  Questionable sepsis EXAM: PORTABLE CHEST 1 VIEW COMPARISON:  04/17/2020 FINDINGS: Cardiomegaly and generalized interstitial prominence with a few Kerley lines. Asymmetric opacity at the left base with mild elevation of the left diaphragm. Implantable loop recorder. No pneumothorax. IMPRESSION: Cardiomegaly and mild pulmonary edema. Asymmetric density at the left base shows volume loss and is presumably atelectasis. Pneumonia is not excluded in the setting of possible sepsis. Electronically Signed   By: Monte Fantasia M.D.   On: 11/06/2020 05:02   ECHOCARDIOGRAM COMPLETE  Result Date: 11/06/2020    ECHOCARDIOGRAM REPORT   Patient Name:   FELTON BUCZYNSKI Prete Date of Exam: 11/06/2020 Medical Rec #:  [id number]         Height:       69.5 in Accession #:    [id number]        Weight:       170.2 lb Date of Birth:  [birth year]          BSA:          1.939 m Patient Age:    36 years          BP:           130/61 mmHg Patient Gender: M                 HR:           89 bpm. Exam Location:  Inpatient Procedure: 2D Echo, Cardiac Doppler and Color Doppler Indications:    I50.33 Acute on chronic diastolic (congestive) heart failure  History:        Patient has prior history of Echocardiogram examinations, most                 recent 08/31/2020. Signs/Symptoms:Fever; Risk Factors:Diabetes.                 Cancer. Sepsis.  Sonographer:    Tiffany Dance Referring Phys: [id number] [place]  1. Left ventricular ejection fraction, by estimation, is 60 to 65%. The left ventricle has normal function. The left ventricle  has no regional wall motion abnormalities. The left ventricular internal cavity size was mildly dilated. Left ventricular  diastolic parameters are consistent with Grade II diastolic dysfunction (pseudonormalization).  2. Right ventricular systolic function is normal. The right ventricular size is normal. There is moderately elevated pulmonary artery systolic pressure.  3. Left atrial size was mild to moderately dilated.  4. Right atrial size was mild to moderately dilated.  5. The pericardial effusion is circumferential.  6. The mitral valve is normal in structure. Mild to moderate mitral valve regurgitation. No evidence of mitral stenosis.  7. The aortic valve is tricuspid. There is mild calcification of the aortic valve. There is mild thickening of the aortic valve. Aortic valve regurgitation is not visualized. Mild to moderate aortic valve sclerosis/calcification is present, without any evidence of aortic stenosis.  8. The inferior vena cava is dilated in size with >50% respiratory variability, suggesting right atrial pressure of 8 mmHg. Comparison(s): The left ventricular function has improved. FINDINGS  Left Ventricle: Left ventricular ejection fraction, by estimation, is 60 to 65%. The left ventricle has normal function. The left ventricle has no regional wall motion abnormalities. The left ventricular internal cavity size was mildly dilated. There is  no left ventricular hypertrophy. Left ventricular diastolic parameters are consistent with Grade II diastolic dysfunction (pseudonormalization). Right Ventricle: The right ventricular size is normal. No increase in right ventricular wall thickness. Right ventricular systolic function is normal. There is moderately elevated pulmonary artery systolic pressure. The tricuspid regurgitant velocity is 3.09 m/s, and with an assumed right atrial pressure of 8 mmHg, the estimated right ventricular systolic pressure is 16.1 mmHg. Left Atrium: Left atrial size was mild to moderately dilated. Right Atrium: Right atrial size was mild to moderately dilated. Pericardium: Trivial pericardial  effusion is present. The pericardial effusion is circumferential. Mitral Valve: The mitral valve is normal in structure. Mild to moderate mitral valve regurgitation. No evidence of mitral valve stenosis. Tricuspid Valve: The tricuspid valve is normal in structure. Tricuspid valve regurgitation is mild . No evidence of tricuspid stenosis. Aortic Valve: The aortic valve is tricuspid. There is mild calcification of the aortic valve. There is mild thickening of the aortic valve. Aortic valve regurgitation is not visualized. Mild to moderate aortic valve sclerosis/calcification is present, without any evidence of aortic stenosis. Pulmonic Valve: The pulmonic valve was normal in structure. Pulmonic valve regurgitation is not visualized. No evidence of pulmonic stenosis. Aorta: The aortic root is normal in size and structure. Venous: The inferior vena cava is dilated in size with greater than 50% respiratory variability, suggesting right atrial pressure of 8 mmHg. IAS/Shunts: No atrial level shunt detected by color flow Doppler.  LEFT VENTRICLE PLAX 2D LVIDd:         5.60 cm  Diastology LVIDs:         3.10 cm  LV e' medial:    9.03 cm/s LV PW:         1.00 cm  LV E/e' medial:  13.5 LV IVS:        1.00 cm  LV e' lateral:   11.10 cm/s LVOT diam:     2.10 cm  LV E/e' lateral: 11.0 LV SV:         71 LV SV Index:   36 LVOT Area:     3.46 cm  RIGHT VENTRICLE             IVC RV Basal diam:  3.10 cm     IVC diam: 2.20  cm RV Mid diam:    2.80 cm RV S prime:     16.80 cm/s TAPSE (M-mode): 2.9 cm LEFT ATRIUM             Index       RIGHT ATRIUM           Index LA diam:        4.80 cm 2.48 cm/m  RA Area:     22.50 cm LA Vol (A2C):   86.1 ml 44.41 ml/m RA Volume:   66.90 ml  34.51 ml/m LA Vol (A4C):   79.3 ml 40.91 ml/m LA Biplane Vol: 82.5 ml 42.56 ml/m  AORTIC VALVE LVOT Vmax:   124.00 cm/s LVOT Vmean:  76.600 cm/s LVOT VTI:    0.204 m  AORTA Ao Root diam: 3.60 cm Ao Asc diam:  3.20 cm MITRAL VALVE                TRICUSPID  VALVE MV Area (PHT): 3.12 cm     TR Peak grad:   38.2 mmHg MV Decel Time: 243 msec     TR Vmax:        309.00 cm/s MV E velocity: 122.00 cm/s MV A velocity: 77.80 cm/s   SHUNTS MV E/A ratio:  1.57         Systemic VTI:  0.20 m                             Systemic Diam: 2.10 cm Candee Furbish MD Electronically signed by Candee Furbish MD Signature Date/Time: 11/06/2020/1:57:39 PM    Final     Scheduled Meds: . amiodarone  200 mg Oral Daily  . apixaban  5 mg Oral BID  . budesonide  9 mg Oral Daily  . doxycycline  100 mg Oral Q12H  . folic acid  1 mg Oral Daily  . furosemide  40 mg Oral BID  . insulin aspart  0-5 Units Subcutaneous QHS  . insulin aspart  0-9 Units Subcutaneous TID WC  . metoprolol tartrate  50 mg Oral BID  . pioglitazone  15 mg Oral Daily  . potassium chloride  40 mEq Oral Once   Continuous Infusions: . cefTRIAXone (ROCEPHIN)  IV 2 g (11/07/20 0904)     LOS: 0 days   Time spent: 35 minutes.  Patrecia Pour, MD [facility] [email or web address] 11/07/2020, 10:16 AM

## 2020-11-08 DIAGNOSIS — E8770 Fluid overload, unspecified: Secondary | ICD-10-CM | POA: Diagnosis not present

## 2020-11-08 DIAGNOSIS — I5033 Acute on chronic diastolic (congestive) heart failure: Secondary | ICD-10-CM | POA: Diagnosis not present

## 2020-11-08 LAB — COMPREHENSIVE METABOLIC PANEL
ALT: 56 U/L — ABNORMAL HIGH (ref 0–44)
AST: 17 U/L (ref 15–41)
Albumin: 3 g/dL — ABNORMAL LOW (ref 3.5–5.0)
Alkaline Phosphatase: 73 U/L (ref 38–126)
Anion gap: 7 (ref 5–15)
BUN: 13 mg/dL (ref 8–23)
CO2: 25 mmol/L (ref 22–32)
Calcium: 8.3 mg/dL — ABNORMAL LOW (ref 8.9–10.3)
Chloride: 103 mmol/L (ref 98–111)
Creatinine, Ser: 0.45 mg/dL — ABNORMAL LOW (ref 0.61–1.24)
GFR, Estimated: >60 mL/min (ref >60)
Glucose, Bld: 257 mg/dL — ABNORMAL HIGH (ref 70–99)
Potassium: 4.3 mmol/L (ref 3.5–5.1)
Sodium: 135 mmol/L (ref 135–145)
Total Bilirubin: 1.3 mg/dL — ABNORMAL HIGH (ref 0.3–1.2)
Total Protein: 5.7 g/dL — ABNORMAL LOW (ref 6.5–8.1)

## 2020-11-08 LAB — GLUCOSE, CAPILLARY: Glucose-Capillary: 207 mg/dL — ABNORMAL HIGH (ref 70–99)

## 2020-11-08 LAB — CBC
HCT: 24.9 % — ABNORMAL LOW (ref 39.0–52.0)
Hemoglobin: 8.2 g/dL — ABNORMAL LOW (ref 13.0–17.0)
MCH: 31.9 pg (ref 26.0–34.0)
MCHC: 32.9 g/dL (ref 30.0–36.0)
MCV: 96.9 fL (ref 80.0–100.0)
Platelets: 231 10*3/uL (ref 150–400)
RBC: 2.57 MIL/uL — ABNORMAL LOW (ref 4.22–5.81)
RDW: 20.2 % — ABNORMAL HIGH (ref 11.5–15.5)
WBC: 14.7 10*3/uL — ABNORMAL HIGH (ref 4.0–10.5)
nRBC: 0 % (ref 0.0–0.2)

## 2020-11-08 LAB — TYPE AND SCREEN
ABO/RH(D): O POS
Antibody Screen: NEGATIVE
Unit division: 0

## 2020-11-08 LAB — URINE CULTURE: Culture: >=100000 — AB

## 2020-11-08 LAB — BPAM RBC
Blood Product Expiration Date: 202204272359
ISSUE DATE / TIME: 202204031205
Unit Type and Rh: 5100

## 2020-11-08 MED ORDER — CEPHALEXIN 500 MG PO CAPS: 500.0000 mg | ORAL_CAPSULE | Freq: Two times a day (BID) | ORAL | 0 refills | Status: DC

## 2020-11-08 MED ORDER — INSULIN GLARGINE 100 UNIT/ML ~~LOC~~ SOLN
15.0000 [IU] | Freq: Every day | SUBCUTANEOUS | Status: DC
  Filled 2020-11-08 (×2): qty 0.15

## 2020-11-08 MED ORDER — POTASSIUM CHLORIDE ER 10 MEQ PO TBCR: 10.0000 meq | EXTENDED_RELEASE_TABLET | Freq: Every day | ORAL | 0 refills | Status: DC

## 2020-11-08 MED ORDER — FUROSEMIDE 40 MG PO TABS: 40.0000 mg | ORAL_TABLET | Freq: Every day | ORAL | 0 refills | Status: DC

## 2020-11-08 MED ORDER — INSULIN ASPART 100 UNIT/ML ~~LOC~~ SOLN
0.0000 [IU] | Freq: Three times a day (TID) | SUBCUTANEOUS | Status: DC
  Administered 2020-11-08: 5 [IU] via SUBCUTANEOUS

## 2020-11-08 MED ORDER — INSULIN ASPART 100 UNIT/ML ~~LOC~~ SOLN: 0.0000 [IU] | Freq: Every day | SUBCUTANEOUS | Status: DC

## 2020-11-08 NOTE — Evaluation (Signed)
Physical Therapy Evaluation Patient Details Name: Tommy Peterson MRN: [id number] DOB: Jul 23, 1941 Today's Date: 11/08/2020   History of Present Illness  Pt is 80 yo male admitted with acute diastolic CHF/volume overload.  PMH includes CLL, DM2, PAF, HTN, CHF  Clinical Impression  Pt admitted with above diagnosis.  Demonstrates all mobility including stairs safely.  He was independent with gait with stable VS on RA.  Scored a 22/24 on the DGI indicating low fall risk.  Pt feels he is near his baseline.  No further PT needs.     Follow Up Recommendations No PT follow up    Equipment Recommendations  None recommended by PT    Recommendations for Other Services       Precautions / Restrictions Precautions Precautions: None      Mobility  Bed Mobility Overal bed mobility: Independent                  Transfers Overall transfer level: Needs assistance Equipment used: None Transfers: Sit to/from Stand Sit to Stand: Supervision         General transfer comment: Had supervision during eval but performed safely and independently  Ambulation/Gait Ambulation/Gait assistance: Min guard;Supervision Gait Distance (Feet): 300 Feet Assistive device: None Gait Pattern/deviations: WFL(Within Functional Limits) Gait velocity: normal   General Gait Details: Had supervision during eval but performed safely and independently  Stairs Stairs: Yes Stairs assistance: Min guard Stair Management: One rail Left;Alternating pattern;Forwards Number of Stairs: 10 General stair comments: without difficulty  Wheelchair Mobility    Modified Rankin (Stroke Patients Only)       Balance Overall balance assessment: Independent                               Standardized Balance Assessment Standardized Balance Assessment : Dynamic Gait Index   Dynamic Gait Index Level Surface: Normal Change in Gait Speed: Normal Gait with Horizontal Head Turns: Normal Gait with  Vertical Head Turns: Normal Gait and Pivot Turn: Normal Step Over Obstacle: Mild Impairment Step Around Obstacles: Normal Steps: Mild Impairment Total Score: 22       Pertinent Vitals/Pain Pain Assessment: No/denies pain    Home Living Family/patient expects to be discharged to:: Private residence Living Arrangements: Spouse/significant other;Children (Children ages 16 and 65 at home; 2 -66 yo nearby) Available Help at Discharge: Family;Available 24 hours/day Type of Home: House Home Access: Stairs to enter Entrance Stairs-Rails: Left Entrance Stairs-Number of Steps: 6 Home Layout: Multi-level;Able to live on main level with bedroom/bathroom Home Equipment: Grab bars - tub/shower;Walker - 2 wheels      Prior Function     Gait / Transfers Assistance Needed: Could ambulate in community; no recent  ADL's / Homemaking Assistance Needed: Independent with ADLs, IADLs, and driving  Comments: Has 4 adopted children (college, high school)     Journalist, newspaper        Extremity/Trunk Assessment   Upper Extremity Assessment Upper Extremity Assessment: Overall WFL for tasks assessed    Lower Extremity Assessment Lower Extremity Assessment: Overall WFL for tasks assessed    Cervical / Trunk Assessment Cervical / Trunk Assessment: Other exceptions Cervical / Trunk Exceptions: forward head  Communication   Communication: No difficulties  Cognition Arousal/Alertness: Awake/alert Behavior During Therapy: WFL for tasks assessed/performed Overall Cognitive Status: Within Functional Limits for tasks assessed  General Comments General comments (skin integrity, edema, etc.): VSS on RA    Exercises     Assessment/Plan    PT Assessment Patent does not need any further PT services  PT Problem List         PT Treatment Interventions      PT Goals (Current goals can be found in the Care Plan section)  Acute Rehab PT  Goals Patient Stated Goal: return home PT Goal Formulation: All assessment and education complete, DC therapy    Frequency     Barriers to discharge        Co-evaluation               AM-PAC PT "6 Clicks" Mobility  Outcome Measure Help needed turning from your back to your side while in a flat bed without using bedrails?: None Help needed moving from lying on your back to sitting on the side of a flat bed without using bedrails?: None Help needed moving to and from a bed to a chair (including a wheelchair)?: None Help needed standing up from a chair using your arms (e.g., wheelchair or bedside chair)?: None Help needed to walk in hospital room?: None Help needed climbing 3-5 steps with a railing? : A Little 6 Click Score: 23    End of Session Equipment Utilized During Treatment: Gait belt Activity Tolerance: Patient tolerated treatment well Patient left: in chair;with chair alarm set;with call bell/phone within reach;with family/visitor present Nurse Communication: Mobility status      Time: 7591-6384 PT Time Calculation (min) (ACUTE ONLY): 20 min   Charges:   PT Evaluation $PT Eval Low Complexity: 1 Low          Tommy Peterson, PT Acute Rehab Services Pager [phone] [facility] [phone]    Tommy Peterson 11/08/2020, 11:23 AM

## 2020-11-08 NOTE — Discharge Summary (Signed)
Physician Discharge Summary  Tommy Peterson ZOX:[id number] DOB: 10-Jul-1941 DOA: 11/06/2020  PCP: [facility] - Dr. [name]  Admit date: 11/06/2020 Discharge date: 11/08/2020  Admitted From: Home Disposition: Home   Recommendations for Outpatient Follow-up:  1. Follow up with PCP in 1-2 weeks 2. Follow up with cardiology as scheduled 4/11. Started on lasix 40mg , K 48mEq daily. 168lbs at DC. Maintained sinus rhythm during hospitalization.  3. Recheck BMP and CBC at follow up. 4. Consider GI referral for hepatitis C Ab positivity.   Home Health: None recommended Equipment/Devices: None recommended Discharge Condition: Stable, improved CODE STATUS: Full Diet recommendation: Heart healthy  Brief/Interim Summary: Tommy Peterson is an 80 y.o. male with a history of CLL, T2DM, PAF, HTN who presented to the ED4/2 with confusion, dyspnea and increased urinary frequency. Urinalysis strongly suggested UTI, CXR with pulmonary edema and legs were swollen. BNP elevated. WBC 16.7k, hgb 7.5. Antibiotics, lasix were given. 1u PRBCs given the following day for hgb 6.8g/dl with appropriate improvement and no bleeding. Urine culture grew E. coli for which susceptibility-guided antibiotics will be continued. Echocardiogram confirmed grade 2 diastolic dysfunction. Lasix has been continued with improvement in volume overload and stable metabolic panel. Mental status quickly normalized after treatment was initiated and he demonstrates functional independence for mobility, no needs per physical therapy. The patient strongly prefers to be discharged on 4/4 and has reliable follow up.   Discharge Diagnoses:  Active Problems:   Volume overload   Acute on chronic heart failure with preserved ejection fraction (HFpEF) (HCC)  Acute diastolic CHF: LVEF 81-19% with G2DD, no RWMA, small circumferential pericardial effusion (no chest pain).  - LE edema and crackles are improved. the patient has no  exertional hypoxia and dyspnea is improved to baseline. Given option of continuing aggressive diuresis, pt opts to discharge on oral lasix and follow up.  - Monitor daily weights at home.   E. coli UTI: Hx recurrent UTI. Has seen urology who felt there was "nothing wrong with me."  - Ok to resume supplements he was taking PTA. - Blood cultures NGTD, WBC responding well, afebrile, symptoms improved. Will DC on keflex per susceptibility data.   Acute hypoxic respiratory failure due to acute pulmonary edema from acute HFpEF: Resolved. - CXR opacities most consistent with pulmonary edema, will DC on keflex as above with low suspicion for pneumonia.   PAF: Followed by Dr. Loletha Grayer. Recent TEE-DCCV canceled due to conversion to sinus rhythm which he is still maintaining. Pt calls Dx into question.  - Continue amiodarone, metoprolol and eliquis (no active bleeding). NSR during this admission. - Follow up w/Dr. Sallyanne Kuster is scheduled 4/11.  Acute metabolic encephalopathy: Resolved.  CLL with transfusion-dependent anemia: Given 1u PRBCs with appropriate resonse, hgb 8.2g/dl at DC. - Was taken off MTX per pt  Celiac disease: Dx reported by patient given after EGD/colo by Dr. Benson Norway.  - Awaiting referral to GI at [facility] which is scheduled.  - Continue entocort  T2DM: HbA1c 8.3% - Continue home medications and PCP follow up  HTN:  - Continue metoprolol  INR elevation: Most likely attributable to eliquis. LFTs stable.   Positive hepatis C antibody:  - Outpatient follow up.   Discharge Instructions Discharge Instructions    Diet - low sodium heart healthy   Complete by: As directed    Diet Carb Modified   Complete by: As directed    Discharge instructions   Complete by: As directed    You were admitted  for confusion related to a urinary tract infection which was due to E. coli. This is susceptible to several antibiotics, one of which is keflex which will be prescribed to your pharmacy to  be taken twice daily for 7 days to treat this.   You were also found to have pulmonary edema (fluid in the lungs) due to heart failure (or CHF). The specific type of heart failure is called diastolic heart failure. You have responded very well to lasix to take fluid off and can continue taking this (40mg  once daily, sent to your pharmacy). When you take this, you will also need to take potassium once daily (also sent to your pharmacy).   Because you need follow up with a doctor for a reexamination and to recheck your potassium and kidney function, please keep the appointment with Dr. Sallyanne Kuster on Monday. You can also follow up with Dr. Theda Sers in the next week. If you experience any shortness of breath, chest pain, vomiting, or fevers, seek medical advice/attention sooner.   Increase activity slowly   Complete by: As directed    No wound care   Complete by: As directed      Allergies as of 11/08/2020      Reactions   Bee Venom Swelling, Anaphylaxis   Metformin And Related Nausea And Vomiting   Sulfamethoxazole-trimethoprim Nausea And Vomiting      Medication List    TAKE these medications   amiodarone 200 MG tablet Commonly known as: PACERONE Take 1 tablet (200 mg total) by mouth daily.   apixaban 5 MG Tabs tablet Commonly known as: Eliquis Take 1 tablet (5 mg total) by mouth 2 (two) times daily.   b complex vitamins capsule Take 1 capsule by mouth every evening.   BD Pen Needle Nano 2nd Gen 32G X 4 MM Misc Generic drug: Insulin Pen Needle   budesonide 3 MG 24 hr capsule Commonly known as: ENTOCORT EC Take 9 mg by mouth daily.   cephALEXin 500 MG capsule Commonly known as: KEFLEX Take 1 capsule (500 mg total) by mouth 2 (two) times daily.   Cranberry 500 MG Tabs Take 500 mg by mouth daily.   D-Mannose 500 MG Caps Take 500 mg by mouth in the morning and at bedtime.   DAILY HERBS MEMORY PO Take 300 mg by mouth daily. [employer] Neuro-ps   PROSTATE HEALTH PO Take 1  capsule by mouth every evening. Prostate Plus Health Complex   folic acid 1 MG tablet Commonly known as: FOLVITE TAKE 1 TABLET BY MOUTH EVERY DAY   furosemide 40 MG tablet Commonly known as: Lasix Take 1 tablet (40 mg total) by mouth daily.   Insulin Glargine-Lixisenatide 100-33 UNT-MCG/ML Sopn Inject 26 Units into the skin at bedtime. Soliqua 100/33   methotrexate 2.5 MG tablet Commonly known as: RHEUMATREX Take 10 mg by mouth once a week. Caution:Chemotherapy. Protect from light.   metoprolol tartrate 50 MG tablet Commonly known as: LOPRESSOR Take 50 mg by mouth 2 (two) times daily.   NovoLOG FlexPen 100 UNIT/ML FlexPen Generic drug: insulin aspart Inject 6 Units into the skin 3 (three) times daily with meals.   oxybutynin 5 MG tablet Commonly known as: DITROPAN Take 5 mg by mouth daily as needed (Too slow down urine).   pioglitazone 15 MG tablet Commonly known as: ACTOS Take 15 mg by mouth every morning.   potassium chloride 10 MEQ tablet Commonly known as: KLOR-CON Take 1 tablet (10 mEq total) by mouth daily.  traMADol 50 MG tablet Commonly known as: ULTRAM Take 50 mg by mouth at bedtime as needed (restlessness).   Vitamin B-12 5000 MCG Subl Place 5,000 mcg under the tongue daily.       Follow-up Information    Croitoru, Mihai, MD. Go on 11/15/2020.   Specialty: Cardiology Contact information: [address] [place] [address] [phone]        [name], DO. Schedule an appointment as soon as possible for a visit.   Specialty: Family Medicine Contact information: [address] [place] [address] [place] [address] [phone]              Allergies  Allergen Reactions  . Bee Venom Swelling and Anaphylaxis  . Metformin And Related Nausea And Vomiting  . Sulfamethoxazole-Trimethoprim Nausea And Vomiting    Consultations:  None  Procedures/Studies: DG Chest Port 1 View  Result Date: 11/06/2020 CLINICAL DATA:   Questionable sepsis EXAM: PORTABLE CHEST 1 VIEW COMPARISON:  04/17/2020 FINDINGS: Cardiomegaly and generalized interstitial prominence with a few Kerley lines. Asymmetric opacity at the left base with mild elevation of the left diaphragm. Implantable loop recorder. No pneumothorax. IMPRESSION: Cardiomegaly and mild pulmonary edema. Asymmetric density at the left base shows volume loss and is presumably atelectasis. Pneumonia is not excluded in the setting of possible sepsis. Electronically Signed   By: Monte Fantasia M.D.   On: 11/06/2020 05:02   ECHOCARDIOGRAM COMPLETE  Result Date: 11/06/2020    ECHOCARDIOGRAM REPORT   Patient Name:   Tommy Peterson Date of Exam: 11/06/2020 Medical Rec #:  [id number]         Height:       69.5 in Accession #:    [id number]        Weight:       170.2 lb Date of Birth:  Jul 01, 1941          BSA:          1.939 m Patient Age:    80 years          BP:           130/61 mmHg Patient Gender: M                 HR:           89 bpm. Exam Location:  Inpatient Procedure: 2D Echo, Cardiac Doppler and Color Doppler Indications:    I50.33 Acute on chronic diastolic (congestive) heart failure  History:        Patient has prior history of Echocardiogram examinations, most                 recent 08/31/2020. Signs/Symptoms:Fever; Risk Factors:Diabetes.                 Cancer. Sepsis.  Sonographer:    Tiffany Dance Referring Phys: [id number] [place]  1. Left ventricular ejection fraction, by estimation, is 60 to 65%. The left ventricle has normal function. The left ventricle has no regional wall motion abnormalities. The left ventricular internal cavity size was mildly dilated. Left ventricular diastolic parameters are consistent with Grade II diastolic dysfunction (pseudonormalization).  2. Right ventricular systolic function is normal. The right ventricular size is normal. There is moderately elevated pulmonary artery systolic pressure.  3. Left atrial size was mild to moderately  dilated.  4. Right atrial size was mild to moderately dilated.  5. The pericardial effusion is circumferential.  6. The mitral valve is normal in structure. Mild  to moderate mitral valve regurgitation. No evidence of mitral stenosis.  7. The aortic valve is tricuspid. There is mild calcification of the aortic valve. There is mild thickening of the aortic valve. Aortic valve regurgitation is not visualized. Mild to moderate aortic valve sclerosis/calcification is present, without any evidence of aortic stenosis.  8. The inferior vena cava is dilated in size with >50% respiratory variability, suggesting right atrial pressure of 8 mmHg. Comparison(s): The left ventricular function has improved. FINDINGS  Left Ventricle: Left ventricular ejection fraction, by estimation, is 60 to 65%. The left ventricle has normal function. The left ventricle has no regional wall motion abnormalities. The left ventricular internal cavity size was mildly dilated. There is  no left ventricular hypertrophy. Left ventricular diastolic parameters are consistent with Grade II diastolic dysfunction (pseudonormalization). Right Ventricle: The right ventricular size is normal. No increase in right ventricular wall thickness. Right ventricular systolic function is normal. There is moderately elevated pulmonary artery systolic pressure. The tricuspid regurgitant velocity is 3.09 m/s, and with an assumed right atrial pressure of 8 mmHg, the estimated right ventricular systolic pressure is 10.1 mmHg. Left Atrium: Left atrial size was mild to moderately dilated. Right Atrium: Right atrial size was mild to moderately dilated. Pericardium: Trivial pericardial effusion is present. The pericardial effusion is circumferential. Mitral Valve: The mitral valve is normal in structure. Mild to moderate mitral valve regurgitation. No evidence of mitral valve stenosis. Tricuspid Valve: The tricuspid valve is normal in structure. Tricuspid valve regurgitation is  mild . No evidence of tricuspid stenosis. Aortic Valve: The aortic valve is tricuspid. There is mild calcification of the aortic valve. There is mild thickening of the aortic valve. Aortic valve regurgitation is not visualized. Mild to moderate aortic valve sclerosis/calcification is present, without any evidence of aortic stenosis. Pulmonic Valve: The pulmonic valve was normal in structure. Pulmonic valve regurgitation is not visualized. No evidence of pulmonic stenosis. Aorta: The aortic root is normal in size and structure. Venous: The inferior vena cava is dilated in size with greater than 50% respiratory variability, suggesting right atrial pressure of 8 mmHg. IAS/Shunts: No atrial level shunt detected by color flow Doppler.  LEFT VENTRICLE PLAX 2D LVIDd:         5.60 cm  Diastology LVIDs:         3.10 cm  LV e' medial:    9.03 cm/s LV PW:         1.00 cm  LV E/e' medial:  13.5 LV IVS:        1.00 cm  LV e' lateral:   11.10 cm/s LVOT diam:     2.10 cm  LV E/e' lateral: 11.0 LV SV:         71 LV SV Index:   36 LVOT Area:     3.46 cm  RIGHT VENTRICLE             IVC RV Basal diam:  3.10 cm     IVC diam: 2.20 cm RV Mid diam:    2.80 cm RV S prime:     16.80 cm/s TAPSE (M-mode): 2.9 cm LEFT ATRIUM             Index       RIGHT ATRIUM           Index LA diam:        4.80 cm 2.48 cm/m  RA Area:     22.50 cm LA Vol (A2C):   86.1 ml 44.41 ml/m  RA Volume:   66.90 ml  34.51 ml/m LA Vol (A4C):   79.3 ml 40.91 ml/m LA Biplane Vol: 82.5 ml 42.56 ml/m  AORTIC VALVE LVOT Vmax:   124.00 cm/s LVOT Vmean:  76.600 cm/s LVOT VTI:    0.204 m  AORTA Ao Root diam: 3.60 cm Ao Asc diam:  3.20 cm MITRAL VALVE                TRICUSPID VALVE MV Area (PHT): 3.12 cm     TR Peak grad:   38.2 mmHg MV Decel Time: 243 msec     TR Vmax:        309.00 cm/s MV E velocity: 122.00 cm/s MV A velocity: 77.80 cm/s   SHUNTS MV E/A ratio:  1.57         Systemic VTI:  0.20 m                             Systemic Diam: 2.10 cm Candee Furbish MD  Electronically signed by Candee Furbish MD Signature Date/Time: 11/06/2020/1:57:39 PM    Final      Subjective: Feels well, walked with PT in the halls at his baseline. No dyspnea, chest pain, urinary symptoms or fever. Wants to go home.   Discharge Exam: Vitals:   11/07/20 2057 11/08/20 0611  BP: 125/62 (!) 142/64  Pulse: 63 61  Resp:  18  Temp: 98.3 F (36.8 C) 98.4 F (36.9 C)  SpO2: 92% 96%   General: Pt is alert, awake, not in acute distress Cardiovascular: RRR, S1/S2 +, no rubs, no gallops Respiratory: Nonlabored on room air even with exertion. Crackles at bases are improved but remain. Abdominal: Soft, NT, ND, bowel sounds + Extremities: 1+ pitting lower leg symmetric edema, no cyanosis  Labs: BNP (last 3 results) Recent Labs    11/06/20 0450  BNP 504.1*   Basic Metabolic Panel: Recent Labs  Lab 11/01/20 1710 11/03/20 0948 11/06/20 0431 11/07/20 0328 11/08/20 0336  NA 139 139 137 132* 135  K 4.4 4.2 4.7 3.6 4.3  CL 103 106 105 101 103  CO2 22 25 24 24 25   GLUCOSE 274* 234* 214* 269* 257*  BUN 11 15 15 13 13   CREATININE 0.57* 0.63 0.54* 0.45* 0.45*  CALCIUM 8.6 8.1* 8.3* 8.3* 8.3*   Liver Function Tests: Recent Labs  Lab 11/03/20 0948 11/06/20 0431 11/07/20 0328 11/08/20 0336  AST 22 35 24 17  ALT 41 84* 68* 56*  ALKPHOS 100 84 77 73  BILITOT 0.9 1.1 1.7* 1.3*  PROT 6.1* 5.8* 5.3* 5.7*  ALBUMIN 3.3* 3.4* 2.9* 3.0*   No results for input(s): LIPASE, AMYLASE in the last 168 hours. No results for input(s): AMMONIA in the last 168 hours. CBC: Recent Labs  Lab 11/01/20 1710 11/03/20 0948 11/06/20 0431 11/07/20 0328 11/08/20 0336  WBC 12.3* 11.0* 16.7* 25.7* 14.7*  NEUTROABS  --  4.8 11.2*  --   --   HGB 5.9* 5.5* 7.5* 6.8* 8.2*  HCT 17.9* 16.9* 22.6* 20.8* 24.9*  MCV 93 93.9 94.6 95.9 96.9  PLT 356 348 304 240 231   Cardiac Enzymes: No results for input(s): CKTOTAL, CKMB, CKMBINDEX, TROPONINI in the last 168 hours. BNP: Invalid input(s):  POCBNP CBG: Recent Labs  Lab 11/07/20 0723 11/07/20 1135 11/07/20 1615 11/07/20 2114 11/08/20 0814  GLUCAP 219* 223* 187* 301* 207*   D-Dimer No results for input(s): DDIMER in the last 72 hours. Hgb  A1c Recent Labs    11/06/20 1028  HGBA1C 8.3*   Lipid Profile No results for input(s): CHOL, HDL, LDLCALC, TRIG, CHOLHDL, LDLDIRECT in the last 72 hours. Thyroid function studies No results for input(s): TSH, T4TOTAL, T3FREE, THYROIDAB in the last 72 hours.  Invalid input(s): FREET3 Anemia work up No results for input(s): VITAMINB12, FOLATE, FERRITIN, TIBC, IRON, RETICCTPCT in the last 72 hours. Urinalysis    Component Value Date/Time   COLORURINE YELLOW 11/06/2020 0431   APPEARANCEUR HAZY (A) 11/06/2020 0431   LABSPEC 1.018 11/06/2020 0431   PHURINE 5.0 11/06/2020 0431   GLUCOSEU NEGATIVE 11/06/2020 0431   HGBUR NEGATIVE 11/06/2020 0431   BILIRUBINUR NEGATIVE 11/06/2020 0431   BILIRUBINUR negative 10/29/2017 1428   KETONESUR NEGATIVE 11/06/2020 0431   PROTEINUR NEGATIVE 11/06/2020 0431   UROBILINOGEN 0.2 10/29/2017 1428   NITRITE POSITIVE (A) 11/06/2020 0431   LEUKOCYTESUR LARGE (A) 11/06/2020 0431    Microbiology Recent Results (from the past 240 hour(s))  SARS CORONAVIRUS 2 (TAT 6-24 HRS) Nasopharyngeal Nasopharyngeal Swab     Status: None   Collection Time: 11/02/20  9:17 AM   Specimen: Nasopharyngeal Swab  Result Value Ref Range Status   SARS Coronavirus 2 NEGATIVE NEGATIVE Final    Comment: (NOTE) SARS-CoV-2 target nucleic acids are NOT DETECTED.  The SARS-CoV-2 RNA is generally detectable in upper and lower respiratory specimens during the acute phase of infection. Negative results do not preclude SARS-CoV-2 infection, do not rule out co-infections with other pathogens, and should not be used as the sole basis for treatment or other patient management decisions. Negative results must be combined with clinical observations, patient history, and  epidemiological information. The expected result is Negative.  Fact Sheet for Patients: SugarRoll.be  Fact Sheet for Healthcare Providers: https://www.woods-mathews.com/  This test is not yet approved or cleared by the [place] FDA and  has been authorized for detection and/or diagnosis of SARS-CoV-2 by FDA under an Emergency Use Authorization (EUA). This EUA will remain  in effect (meaning this test can be used) for the duration of the COVID-19 declaration under Se ction 564(b)(1) of the Act, 21 U.S.C. section 360bbb-3(b)(1), unless the authorization is terminated or revoked sooner.  Performed at [facility], [place] [address], [place], [place] [address]   Blood culture (routine single)     Status: None (Preliminary result)   Collection Time: 11/06/20  4:31 AM   Specimen: BLOOD  Result Value Ref Range Status   Specimen Description   Final    BLOOD UNKNOWN Performed at [facility]    Special Requests   Final    BOTTLES DRAWN AEROBIC AND ANAEROBIC Blood Culture results may not be optimal due to an inadequate volume of blood received in culture bottles Performed at [facility]    Culture   Final    NO GROWTH < 24 HOURS Performed at [facility], [place] [address] [place], [place] [address]    Report Status PENDING  Incomplete  Urine culture     Status: Abnormal   Collection Time: 11/06/20  4:31 AM   Specimen: Urine, Catheterized  Result Value Ref Range Status   Specimen Description   Final    URINE, CATHETERIZED Performed at [facility]    Special Requests NONE Performed at [facility]   Final   Culture >=100,000 COLONIES/mL ESCHERICHIA COLI (A)  Final   Report Status 11/08/2020 FINAL  Final   Organism ID, Bacteria  ESCHERICHIA COLI (A)  Final      Susceptibility   Escherichia coli - MIC*    AMPICILLIN >=32 RESISTANT Resistant     CEFAZOLIN  <=4 SENSITIVE Sensitive     CEFEPIME <=0.12 SENSITIVE Sensitive     CEFTRIAXONE <=0.25 SENSITIVE Sensitive     CIPROFLOXACIN <=0.25 SENSITIVE Sensitive     GENTAMICIN <=1 SENSITIVE Sensitive     IMIPENEM <=0.25 SENSITIVE Sensitive     NITROFURANTOIN <=16 SENSITIVE Sensitive     TRIMETH/SULFA >=320 RESISTANT Resistant     AMPICILLIN/SULBACTAM >=32 RESISTANT Resistant     PIP/TAZO <=4 SENSITIVE Sensitive     * >=100,000 COLONIES/mL ESCHERICHIA COLI  Resp Panel by RT-PCR (Flu A&B, Covid) Nasopharyngeal Swab     Status: None   Collection Time: 11/06/20  4:35 AM   Specimen: Nasopharyngeal Swab; Nasopharyngeal(NP) swabs in vial transport medium  Result Value Ref Range Status   SARS Coronavirus 2 by RT PCR NEGATIVE NEGATIVE Final    Comment: (NOTE) SARS-CoV-2 target nucleic acids are NOT DETECTED.  The SARS-CoV-2 RNA is generally detectable in upper respiratory specimens during the acute phase of infection. The lowest concentration of SARS-CoV-2 viral copies this assay can detect is 138 copies/mL. A negative result does not preclude SARS-Cov-2 infection and should not be used as the sole basis for treatment or other patient management decisions. A negative result may occur with  improper specimen collection/handling, submission of specimen other than nasopharyngeal swab, presence of viral mutation(s) within the areas targeted by this assay, and inadequate number of viral copies(<138 copies/mL). A negative result must be combined with clinical observations, patient history, and epidemiological information. The expected result is Negative.  Fact Sheet for Patients:  EntrepreneurPulse.com.au  Fact Sheet for Healthcare Providers:  IncredibleEmployment.be  This test is no t yet approved or cleared by the [place] FDA and  has been authorized for detection and/or diagnosis of SARS-CoV-2 by FDA under an Emergency Use Authorization (EUA). This EUA will  remain  in effect (meaning this test can be used) for the duration of the COVID-19 declaration under Section 564(b)(1) of the Act, 21 U.S.C.section 360bbb-3(b)(1), unless the authorization is terminated  or revoked sooner.       Influenza A by PCR NEGATIVE NEGATIVE Final   Influenza B by PCR NEGATIVE NEGATIVE Final    Comment: (NOTE) The Xpert Xpress SARS-CoV-2/FLU/RSV plus assay is intended as an aid in the diagnosis of influenza from Nasopharyngeal swab specimens and should not be used as a sole basis for treatment. Nasal washings and aspirates are unacceptable for Xpert Xpress SARS-CoV-2/FLU/RSV testing.  Fact Sheet for Patients: EntrepreneurPulse.com.au  Fact Sheet for Healthcare Providers: IncredibleEmployment.be  This test is not yet approved or cleared by the [place] FDA and has been authorized for detection and/or diagnosis of SARS-CoV-2 by FDA under an Emergency Use Authorization (EUA). This EUA will remain in effect (meaning this test can be used) for the duration of the COVID-19 declaration under Section 564(b)(1) of the Act, 21 U.S.C. section 360bbb-3(b)(1), unless the authorization is terminated or revoked.  Performed at [facility]     Time coordinating discharge: Approximately 40 minutes  Patrecia Pour, MD  [facility] 11/08/2020, 10:25 AM

## 2020-11-08 NOTE — Plan of Care (Signed)
  Problem: Activity: Goal: Risk for activity intolerance will decrease Outcome: Progressing   Problem: Education: Goal: Ability to demonstrate management of disease process will improve Outcome: Progressing Goal: Ability to verbalize understanding of medication therapies will improve Outcome: Progressing Goal: Individualized Educational Video(s) Outcome: Progressing   Problem: Skin Integrity: Goal: Risk for impaired skin integrity will decrease Outcome: Progressing

## 2020-11-08 NOTE — Plan of Care (Signed)
Discharge papers given to patient. RN went over AVS with patient and his wife. All questions answered. IV removed and dressing applied. All patient's personal belongings were sent home with patient at time of discharge.

## 2020-11-11 ENCOUNTER — Ambulatory Visit: Payer: Medicare HMO | Admitting: Physician Assistant

## 2020-11-11 LAB — CULTURE, BLOOD (SINGLE): Culture: NO GROWTH

## 2020-11-12 LAB — CULTURE, BLOOD (ROUTINE X 2)
Culture: NO GROWTH
Culture: NO GROWTH
Special Requests: ADEQUATE

## 2020-11-15 ENCOUNTER — Other Ambulatory Visit: Payer: Self-pay

## 2020-11-15 ENCOUNTER — Ambulatory Visit: Payer: Medicare HMO | Admitting: Cardiovascular Disease

## 2020-11-15 ENCOUNTER — Encounter: Payer: Self-pay | Admitting: Cardiovascular Disease

## 2020-11-15 VITALS — BP 122/56 | HR 68 | Ht 69.5 in | Wt 161.8 lb

## 2020-11-15 DIAGNOSIS — I700 Atherosclerosis of aorta: Secondary | ICD-10-CM

## 2020-11-15 DIAGNOSIS — Z5181 Encounter for therapeutic drug level monitoring: Secondary | ICD-10-CM | POA: Diagnosis not present

## 2020-11-15 DIAGNOSIS — Z79899 Other long term (current) drug therapy: Secondary | ICD-10-CM

## 2020-11-15 DIAGNOSIS — Z4509 Encounter for adjustment and management of other cardiac device: Secondary | ICD-10-CM | POA: Diagnosis not present

## 2020-11-15 DIAGNOSIS — D539 Nutritional anemia, unspecified: Secondary | ICD-10-CM | POA: Diagnosis not present

## 2020-11-15 DIAGNOSIS — I480 Paroxysmal atrial fibrillation: Secondary | ICD-10-CM | POA: Diagnosis not present

## 2020-11-15 DIAGNOSIS — C91Z0 Other lymphoid leukemia not having achieved remission: Secondary | ICD-10-CM

## 2020-11-15 NOTE — Patient Instructions (Signed)
Medication Instructions:  No changes *If you need a refill on your cardiac medications before your next appointment, please call your pharmacy*   Lab Work: Your provider would like for you to have the following labs today: TSH  If you have labs (blood work) drawn today and your tests are completely normal, you will receive your results only by: Marland Kitchen MyChart Message (if you have MyChart) OR . A paper copy in the mail If you have any lab test that is abnormal or we need to change your treatment, we will call you to review the results.   Testing/Procedures: None ordered   Follow-Up: At [facility], you and your health needs are our priority.  As part of our continuing mission to provide you with exceptional heart care, we have created designated Provider Care Teams.  These Care Teams include your primary Cardiologist (physician) and Advanced Practice Providers (APPs -  Physician Assistants and Nurse Practitioners) who all work together to provide you with the care you need, when you need it.  We recommend signing up for the patient portal called "MyChart".  Sign up information is provided on this After Visit Summary.  MyChart is used to connect with patients for Virtual Visits (Telemedicine).  Patients are able to view lab/test results, encounter notes, upcoming appointments, etc.  Non-urgent messages can be sent to your provider as well.   To learn more about what you can do with MyChart, go to [email or web address].    Your next appointment:   6 month(s)  The format for your next appointment:   In Person  Provider:   [name], MD

## 2020-11-17 ENCOUNTER — Inpatient Hospital Stay: Payer: Medicare HMO

## 2020-11-17 ENCOUNTER — Other Ambulatory Visit: Payer: Self-pay

## 2020-11-17 ENCOUNTER — Inpatient Hospital Stay: Payer: Medicare HMO | Attending: Hematology

## 2020-11-17 DIAGNOSIS — C91Z0 Other lymphoid leukemia not having achieved remission: Secondary | ICD-10-CM | POA: Insufficient documentation

## 2020-11-17 DIAGNOSIS — D649 Anemia, unspecified: Secondary | ICD-10-CM

## 2020-11-17 LAB — CMP (CANCER CENTER ONLY)
ALT: 45 U/L — ABNORMAL HIGH (ref 0–44)
AST: 22 U/L (ref 15–41)
Albumin: 3.4 g/dL — ABNORMAL LOW (ref 3.5–5.0)
Alkaline Phosphatase: 88 U/L (ref 38–126)
Anion gap: 9 (ref 5–15)
BUN: 12 mg/dL (ref 8–23)
CO2: 25 mmol/L (ref 22–32)
Calcium: 8.5 mg/dL — ABNORMAL LOW (ref 8.9–10.3)
Chloride: 105 mmol/L (ref 98–111)
Creatinine: 0.63 mg/dL (ref 0.61–1.24)
GFR, Estimated: >60 mL/min (ref >60)
Glucose, Bld: 216 mg/dL — ABNORMAL HIGH (ref 70–99)
Potassium: 4.3 mmol/L (ref 3.5–5.1)
Sodium: 139 mmol/L (ref 135–145)
Total Bilirubin: 1 mg/dL (ref 0.3–1.2)
Total Protein: 6.3 g/dL — ABNORMAL LOW (ref 6.5–8.1)

## 2020-11-17 LAB — CBC WITH DIFFERENTIAL/PLATELET
Abs Immature Granulocytes: 0.1 10*3/uL — ABNORMAL HIGH (ref 0.00–0.07)
Basophils Absolute: 0 10*3/uL (ref 0.0–0.1)
Basophils Relative: 0 %
Eosinophils Absolute: 0.1 10*3/uL (ref 0.0–0.5)
Eosinophils Relative: 1 %
HCT: 27.3 % — ABNORMAL LOW (ref 39.0–52.0)
Hemoglobin: 8.9 g/dL — ABNORMAL LOW (ref 13.0–17.0)
Immature Granulocytes: 1 %
Lymphocytes Relative: 52 %
Lymphs Abs: 5.7 10*3/uL — ABNORMAL HIGH (ref 0.7–4.0)
MCH: 32.5 pg (ref 26.0–34.0)
MCHC: 32.6 g/dL (ref 30.0–36.0)
MCV: 99.6 fL (ref 80.0–100.0)
Monocytes Absolute: 1.1 10*3/uL — ABNORMAL HIGH (ref 0.1–1.0)
Monocytes Relative: 10 %
Neutro Abs: 3.9 10*3/uL (ref 1.7–7.7)
Neutrophils Relative %: 36 %
Platelets: 275 10*3/uL (ref 150–400)
RBC: 2.74 MIL/uL — ABNORMAL LOW (ref 4.22–5.81)
RDW: 24.3 % — ABNORMAL HIGH (ref 11.5–15.5)
WBC: 10.9 10*3/uL — ABNORMAL HIGH (ref 4.0–10.5)
nRBC: 0 % (ref 0.0–0.2)

## 2020-11-17 LAB — SAMPLE TO BLOOD BANK

## 2020-11-21 NOTE — Progress Notes (Signed)
Cardiology Office Note:    Date:  11/21/2020   ID:  Tommy Peterson, DOB September 01, 1940, MRN [id number]  PCP:  [name], DO  Cardiologist:  Fletcher Ostermiller  Chief Complaint  Patient presents with  . Follow-up    History of Present Illness:    Tommy Peterson is a 80 y.o. 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  A loop recorder was implanted in 2019 and for couple of years did not show any rhythm irregularities, other than paroxysmal atrial tachycardia.  At the patient's request, we stopped performing regular downloads to avoid billing.  He is very reluctant in the past to undergo anticoagulation due to problems with anemia, related to his leukemia and its treatment.  He subsequently presented with symptomatic atrial fibrillation with rapid ventricular response in January 2022, started anticoagulation with Eliquis and metoprolol for rate control.  Rate control was difficult to obtain and the patient became more more symptomatic, although he was also having worsening anemia problems.  He was scheduled for TEE cardioversion but presented in sinus rhythm on 01/28. atrial fibrillation recurred in February and amiodarone was started.  He was hospitalized 04/02-04/04 for UTI-related E.coli sepsis. He has chronic diarrhea (predates hospitalization and antibiotic), attributed to celiac disease and his GI specialist "cannot help him anymore".  He maintained sinus rhythm during the hospital stay and is in sinus rhythm today.  At worst, his CHA2DS2-VASc Score =5(age 57, HTN -questionable dx, DM, atherosclerosis on imaging), 7.2% stroke risk per year.At best, has a CHA2DS2-VASc score of 3.  HASBLED 1.    He does not have  a history of coronary artery disease or peripheral arterial disease.  In 2017 he was admitted with an episode of chest pain and his work-up for ischemia was negative.  Nevertheless, there was evidence of aortic and coronary calcifications on a CT of the chest performed in January 2018.  Past Medical History:  Diagnosis Date  . Acute respiratory failure ([place])   . BCC (basal cell carcinoma of skin) 08/15/2016   w/SCC Left Forehead (treatment @ [facility])  . BPH (benign prostatic hyperplasia)   . Carpal tunnel syndrome on both sides   . Chronic diarrhea    intermittant due to chronic collagenous colitis  . Chronic large granular lymphocytic leukemia ([name]) primary hemotologist-  [name]. 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 Collagenous colitis    chronic--- intermittant between diarrahea and constipation  . ED (erectile dysfunction)   . HCAP (healthcare-associated pneumonia)   . History of adenomatous polyp of colon   . History of Clostridium difficile colitis 05/2016  . History of pneumonia 11/08/2017   CAP -- LLL---  12-19-2017  per pt no residual symptoms  . History of SCC (squamous cell carcinoma) of skin   . History of sepsis    11-20-2017 severe sepsis due to UTI;  10/ 2017sepsis due to c-diff colitis  . Leucocytosis    chronic  . Lower urinary tract symptoms (LUTS)   . Macrocytic anemia    since 02/ 2015  . MGUS (monoclonal gammopathy of unknown  significance)    hemotologist-  [name] @ [place]  . Monoclonal paraproteinemia   . Neuropathy, peripheral   . Nodular basal cell carcinoma (BCC) 10/01/2018   Left Neck(Nodular) Pt Does Not Want Treatment  . Nodular basal cell carcinoma (BCC) 10/01/2018   Mid Forehead (treament curet and excision)  . OA  (osteoarthritis)   . Prostatic stone   . Rash of face    RIGHT SIDE  . Raynaud's syndrome   . Recurrent BCC (basal cell carcinoma) 03/05/2019   positive margin  . SCC (squamous cell carcinoma) 08/10/2009   Central Forehead (Moh's Dr. Rhona Raider @ [facility])  . SCC (squamous cell carcinoma) 12/08/2011   CIS-Left Cheek (treatment [facility] @ [facility])  . SCC (squamous cell carcinoma) 07/09/2015   CIS-Left Forehead (treament @ [facility])  . Septic shock (HCC)    x 3 w/UTI  . Squamous cell carcinoma in situ (SCCIS) 12/08/2011   Left Cheek (treatment Ald[place] @ [facility])   . Superficial basal cell carcinoma (BCC) 10/01/2018   Left Shin-Pt does not want treatment  . Thin skin    fragile  . Transfusion-dependent anemia since 10/ 2017   last transfusion PRBCs 07-19-2017  per hemologist note dated 10-25-2017  . Type 2 diabetes mellitus treated with insulin ([place])    followed by [name] (pcp)  . Urinary hesitancy   . Wears contact lenses    LEFT EYE ONLY    Past Surgical History:  Procedure Laterality Date  . BONE MARROW BIOPSY Right 05-24-2016;  08-20-2014  . CARDIOVASCULAR STRESS TEST  11-26-2012   [name]  @ [place]   normal nuclear study w/ no ischemia/  normal LV function and wall motion , ef 60% (find in care everywhere,epic)  . CARDIOVERSION N/A 08/31/2020   Procedure: CARDIOVERSION;  Surgeon: [name], MD;  Location: [place];  Service: Cardiovascular;  Laterality: N/A;  . CATARACT EXTRACTION W/ INTRAOCULAR LENS  IMPLANT, BILATERAL  2015  . CYSTOSCOPY WITH LITHOLAPAXY N/A 12/25/2017   Procedure: CYSTOSCOPY WITH LITHOLAPAXY AND FULGERATION;  Surgeon: [name], MD;  Location: [facility];  Service: Urology;  Laterality: N/A;  . INGUINAL HERNIA REPAIR Right 1980s  . LOOP RECORDER INSERTION N/A 04/10/2018   Procedure: LOOP RECORDER INSERTION;  Surgeon: [name], MD;  Location: [place] [facility];  Service: Cardiovascular;   Laterality: N/A;  . PENILE PROSTHESIS IMPLANT  12-02-2015   dr [name] @ [facility] in [place]  . SHOULDER SURGERY Left 08/2016  . TEE WITHOUT CARDIOVERSION N/A 08/31/2020   Procedure: TRANSESOPHAGEAL ECHOCARDIOGRAM (TEE);  Surgeon: [name], MD;  Location: [place];  Service: Cardiovascular;  Laterality: N/A;  . TONSILLECTOMY  child  . TRANSURETHRAL RESECTION OF PROSTATE  2009   AND REPAIR RECURRENT RIGHT INGUINAL HERNIA    Current Medications: Current Meds  Medication Sig  . amiodarone (PACERONE) 200 MG tablet Take 1 tablet (200 mg total) by mouth daily.  Marland Kitchen apixaban (ELIQUIS) 5 MG TABS tablet Take 1 tablet (5 mg total) by mouth 2 (two) times daily.  Marland Kitchen b complex vitamins capsule Take 1 capsule by mouth every evening.  . BD PEN NEEDLE NANO 2ND GEN 32G X 4 MM MISC   . budesonide (ENTOCORT EC) 3 MG 24 hr capsule Take 9 mg by mouth daily.  . cephALEXin (KEFLEX) 500 MG capsule Take 1 capsule (500 mg total) by mouth 2 (two) times daily.  . Cranberry 500 MG TABS Take  500 mg by mouth daily.  . Cyanocobalamin (VITAMIN B-12) 5000 MCG SUBL Place 5,000 mcg under the tongue daily.  . D-Mannose 500 MG CAPS Take 500 mg by mouth in the morning and at bedtime.  . folic acid (FOLVITE) 1 MG tablet TAKE 1 TABLET BY MOUTH EVERY DAY (Patient taking differently: Take 1 mg by mouth daily.)  . furosemide (LASIX) 40 MG tablet Take 1 tablet (40 mg total) by mouth daily.  . Insulin Glargine-Lixisenatide 100-33 UNT-MCG/ML SOPN Inject 26 Units into the skin at bedtime. Soliqua 100/33  . metoprolol tartrate (LOPRESSOR) 50 MG tablet Take 50 mg by mouth 2 (two) times daily.  Marland Kitchen NOVOLOG FLEXPEN 100 UNIT/ML FlexPen Inject 6 Units into the skin 3 (three) times daily with meals.  Marland Kitchen oxybutynin (DITROPAN) 5 MG tablet Take 5 mg by mouth daily as needed (Too slow down urine).  . pioglitazone (ACTOS) 15 MG tablet Take 15 mg by mouth every morning.   . potassium chloride (KLOR-CON) 10 MEQ tablet Take 1  tablet (10 mEq total) by mouth daily.  . traMADol (ULTRAM) 50 MG tablet Take 50 mg by mouth at bedtime as needed (restlessness).     Allergies:   Bee venom, Metformin and related, and Sulfamethoxazole-trimethoprim   Social History   Socioeconomic History  . Marital status: Widowed    Spouse name: engaged  . Number of children: 8  . Years of education: college-2  . Highest education level: Not on file  Occupational History  . Occupation: Press photographer  . 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 Smoking status: Former Smoker    Years: 20.00    Types: Cigarettes    Quit date: 05/04/1996    Years since quitting: 24.5  . Smokeless tobacco: Never Used  Vaping Use  . Vaping Use: Never used  Substance and Sexual Activity  . Alcohol use: Yes    Comment: 1-2 glasses of wine every other day  . Drug use: No  . Sexual activity: Never  Other Topics Concern  . Not on file  Social History Narrative  . Not on file   Social Determinants of Health   Financial Resource Strain: Not on file  Food Insecurity: Not on file  Transportation Needs: Not on file  Physical Activity: Not on file  Stress: Not on file  Social Connections: Not on file     Family History: The patient's family history includes Diabetes in his mother; Heart failure in his mother.  ROS:   Please see the history of present illness.   All other systems are reviewed and are negative.  EKGs/Labs/Other Studies Reviewed:    The following studies were reviewed today: Comprehensive download of his loop recorder in the office today.  Echo 11/06/2020  1. Left ventricular ejection fraction, by estimation, is 60 to 65%. The  left ventricle has normal function. The left ventricle has no regional  wall motion abnormalities. The left ventricular internal cavity size was  mildly dilated. Left ventricular  diastolic parameters are consistent with Grade II diastolic dysfunction  (pseudonormalization).  2. Right ventricular  systolic function is normal. The right ventricular  size is normal. There is moderately elevated pulmonary artery systolic  pressure.  3. Left atrial size was mild to moderately dilated.  4. Right atrial size was mild to moderately dilated.  5. The pericardial effusion is circumferential.  6. The mitral valve is normal in structure. Mild to moderate mitral valve  regurgitation. No evidence of mitral stenosis.  7. The  aortic valve is tricuspid. There is mild calcification of the  aortic valve. There is mild thickening of the aortic valve. Aortic valve  regurgitation is not visualized. Mild to moderate aortic valve  sclerosis/calcification is present, without any  evidence of aortic stenosis.  8. The inferior vena cava is dilated in size with >50% respiratory  variability, suggesting right atrial pressure of 8 mmHg.   Comparison(s): The left ventricular function has improved.   EKG:  EKG is ordered today.  It shows atrial fibrillation with rapid ventricular response at 136 bpm, unchanged QS pattern in leads V1-V2, borderline QTC 454 ms.  Recent Labs: 04/10/2020: Magnesium 2.3 11/06/2020: B Natriuretic Peptide 389.9 11/17/2020: ALT 45; BUN 12; Creatinine 0.63; Hemoglobin 8.9; Platelets 275; Potassium 4.3; Sodium 139  Recent Lipid Panel    Component Value Date/Time   CHOL 111 05/05/2016 0402   TRIG 111 04/07/2020 0055   HDL 27 (L) 05/05/2016 0402   CHOLHDL 4.1 05/05/2016 0402   VLDL 21 05/05/2016 0402   LDLCALC 63 05/05/2016 0402    Physical Exam:    VS:  BP (!) 122/56   Pulse 68   Ht 5' 9.5" (1.765 m)   Wt 161 lb 12.8 oz (73.4 kg)   SpO2 96%   BMI 23.55 kg/m     Wt Readings from Last 3 Encounters:  11/15/20 161 lb 12.8 oz (73.4 kg)  11/08/20 168 lb 3.2 oz (76.3 kg)  11/01/20 167 lb 12.8 oz (76.1 kg)    General: Alert, oriented x3, no distress, maybe slightly tachypneic (RR 20), pale Head: no evidence of trauma, PERRL, EOMI, no exophtalmos or lid lag, no myxedema, no  xanthelasma; normal ears, nose and oropharynx Neck: normal jugular venous pulsations and no hepatojugular reflux; brisk carotid pulses without delay and no carotid bruits Chest: clear to auscultation, no signs of consolidation by percussion or palpation, normal fremitus, symmetrical and full respiratory excursions Cardiovascular: normal position and quality of the apical impulse, rapid irregular rhythm, normal first and second heart sounds, no murmurs, rubs or gallops Abdomen: no tenderness or distention, no masses by palpation, no abnormal pulsatility or arterial bruits, normal bowel sounds, no hepatosplenomegaly Extremities: no clubbing, cyanosis or edema; 2+ radial, ulnar and brachial pulses bilaterally; 2+ right femoral, posterior tibial and dorsalis pedis pulses; 2+ left femoral, posterior tibial and dorsalis pedis pulses; no subclavian or femoral bruits Neurological: grossly nonfocal Psych: Normal mood and affect  CHA2DS2-VASc Score = 5  The patient's score is based upon: CHF History: No HTN History: Yes Diabetes History: Yes Stroke History: No Vascular Disease History: Yes Age Score: 2 Gender Score: 0      ASSESSMENT AND PLAN: Paroxysmal Atrial Fibrillation (ICD10:  I48.0) The patient's CHA2DS2-VASc score is 5, indicating a 7.2% annual risk of stroke.    Secondary Hypercoagulable State (ICD10:  D68.69) The patient is at significant risk for stroke/thromboembolism based upon his CHA2DS2-VASc Score of 5.  Continue Apixaban (Eliquis).    Signed,  [name], MD    11/21/2020 10:44 AM    ASSESSMENT:    1. Paroxysmal atrial fibrillation (HCC)   2. Encounter for monitoring amiodarone therapy   3. Encounter for loop recorder check   4. Large granular lymphocytic leukemia ([place])   5. Macrocytic anemia   6. Aortic atherosclerosis (HCC)    PLAN:    In order of problems listed above:  1. AFib: recurrent, resolved after amiodarone. On anticoagulation without overt bleeding,  but has anemia due to hematological malignancy.. 2. Amiodarone:  check LFTs and TSH at least every 6 months. Ordered TSH today again. 3. ILR: Scheduled back for monthly device clinic downloads, but these have not been performed.. 4. Leukemia/chronic anemia: He did not have iron deficiency per labs Oct 2021.  Sees Dr. Irene Limbo. 5. DM: recent deterioration (A1c 8.3% on 11/06/2020), in part due to acute illness. He is lean. On insulin and Actos.  Does have aortic and coronary atherosclerosis on imaging studies, but no clinical CAD/PAD. 6. Aortic and coronary atherosclerosis: Noted on CT chest.  He has never had angina pectoris, but he has never had any functional studies for CAD either.  LDL cholesterol was 63 in 2017 and 70 in 2019, without medications.  He has normal left ventricular systolic function.  All his risk factors appear to be adequately addressed. 7. Recent E.coli sepsis:  He reports this is his 4th event of this type.   Patient Instructions  Medication Instructions:  No changes *If you need a refill on your cardiac medications before your next appointment, please call your pharmacy*   Lab Work: Your provider would like for you to have the following labs today: TSH  If you have labs (blood work) drawn today and your tests are completely normal, you will receive your results only by: Marland Kitchen MyChart Message (if you have MyChart) OR . A paper copy in the mail If you have any lab test that is abnormal or we need to change your treatment, we will call you to review the results.   Testing/Procedures: None ordered   Follow-Up: At [facility], you and your health needs are our priority.  As part of our continuing mission to provide you with exceptional heart care, we have created designated Provider Care Teams.  These Care Teams include your primary Cardiologist (physician) and Advanced Practice Providers (APPs -  Physician Assistants and Nurse Practitioners) who all work together to provide  you with the care you need, when you need it.  We recommend signing up for the patient portal called "MyChart".  Sign up information is provided on this After Visit Summary.  MyChart is used to connect with patients for Virtual Visits (Telemedicine).  Patients are able to view lab/test results, encounter notes, upcoming appointments, etc.  Non-urgent messages can be sent to your provider as well.   To learn more about what you can do with MyChart, go to [email or web address].    Your next appointment:   6 month(s)  The format for your next appointment:   In Person  Provider:   [name], MD     Medication Adjustments/Labs and Tests Ordered: Current medicines are reviewed at length with the patient today.  Concerns regarding medicines are outlined above.  Orders Placed This Encounter  Procedures  . TSH  . EKG 12-Lead   No orders of the defined types were placed in this encounter.   Patient Instructions  Medication Instructions:  No changes *If you need a refill on your cardiac medications before your next appointment, please call your pharmacy*   Lab Work: Your provider would like for you to have the following labs today: TSH  If you have labs (blood work) drawn today and your tests are completely normal, you will receive your results only by: Marland Kitchen MyChart Message (if you have MyChart) OR . A paper copy in the mail If you have any lab test that is abnormal or we need to change your treatment, we will call you to review the results.   Testing/Procedures: None ordered  Follow-Up: At [facility], you and your health needs are our priority.  As part of our continuing mission to provide you with exceptional heart care, we have created designated Provider Care Teams.  These Care Teams include your primary Cardiologist (physician) and Advanced Practice Providers (APPs -  Physician Assistants and Nurse Practitioners) who all work together to provide you with the care you  need, when you need it.  We recommend signing up for the patient portal called "MyChart".  Sign up information is provided on this After Visit Summary.  MyChart is used to connect with patients for Virtual Visits (Telemedicine).  Patients are able to view lab/test results, encounter notes, upcoming appointments, etc.  Non-urgent messages can be sent to your provider as well.   To learn more about what you can do with MyChart, go to [email or web address].    Your next appointment:   6 month(s)  The format for your next appointment:   In Person  Provider:   [name], MD       Signed, [name], MD  11/21/2020 10:44 AM    [place]

## 2020-11-29 ENCOUNTER — Ambulatory Visit
Admission: RE | Admit: 2020-11-29 | Discharge: 2020-11-29 | Disposition: A | Discharge status: Home / Self Care | Payer: Medicare HMO | Source: Ambulatory Visit | Attending: Cardiovascular Disease | Admitting: Cardiovascular Disease

## 2020-11-29 ENCOUNTER — Other Ambulatory Visit: Payer: Self-pay

## 2020-11-29 ENCOUNTER — Telehealth: Payer: Self-pay | Admitting: Emergency Medicine

## 2020-11-29 DIAGNOSIS — R55 Syncope and collapse: Secondary | ICD-10-CM

## 2020-11-29 IMAGING — CT CT HEAD W/O CM
4 series · 16 of 47 positions shown, 18 images · non-contrast
Comparison: None.

CLINICAL DATA: Syncope, fall

EXAM:
CT HEAD WITHOUT CONTRAST
TECHNIQUE: Contiguous axial images were obtained from the base of the skull
through the vertex without intravenous contrast.

[Series 2: head 5.00 hr40 s3 axial ibhc · axial · 0.44mm/px · z∈[-611,-486]mm · 7 of 35 slices shown, 9 images]
[im 5/35  brain]
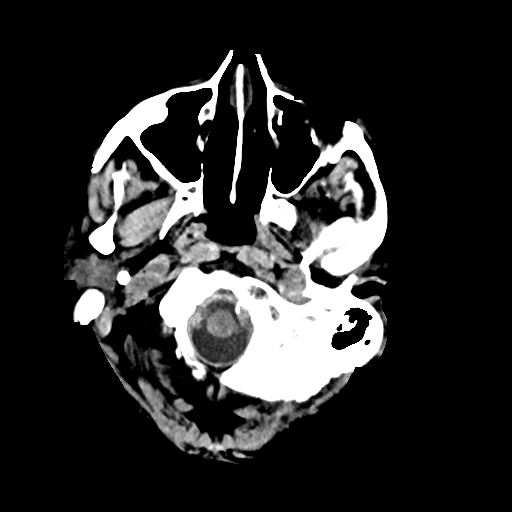
[im 5/35  bone]
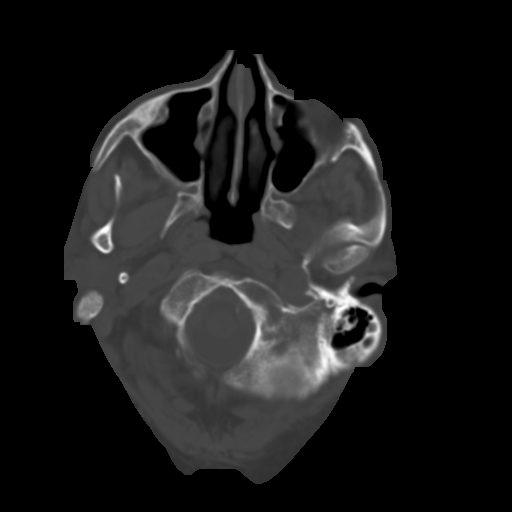
[im 9/35  brain]
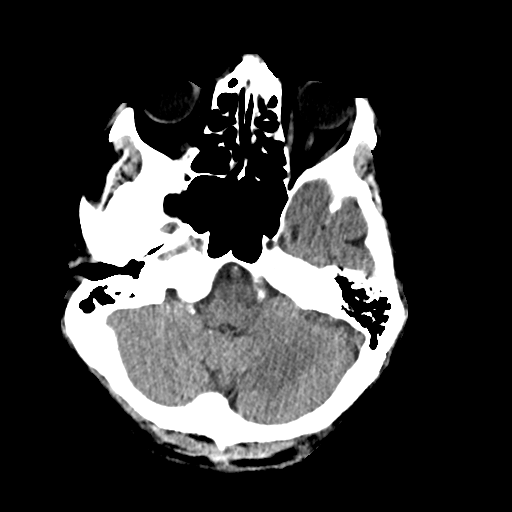
[im 13/35  brain]
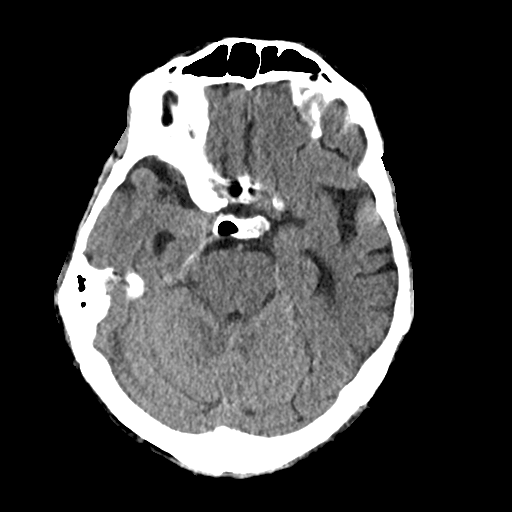
[im 18/35  brain]
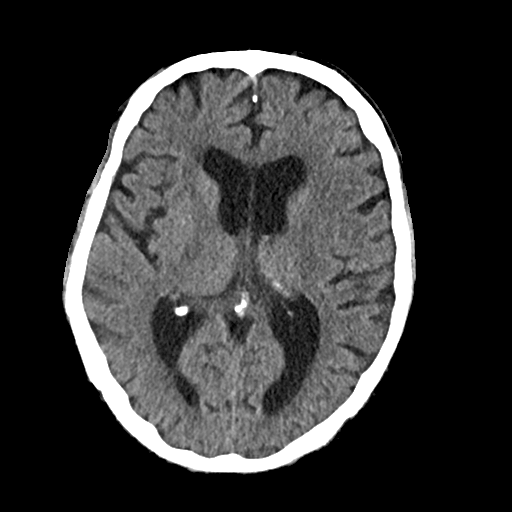
[im 22/35  brain]
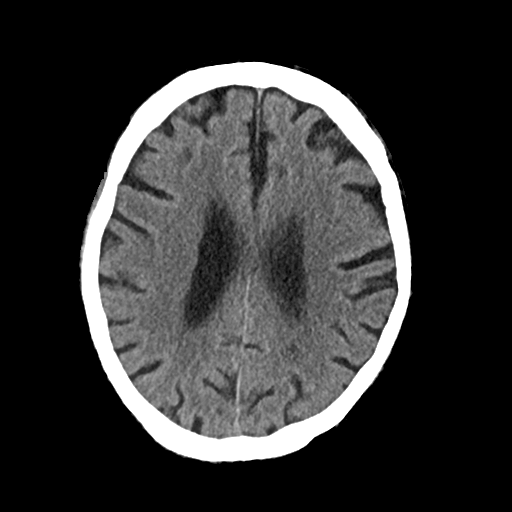
[im 22/35  bone]
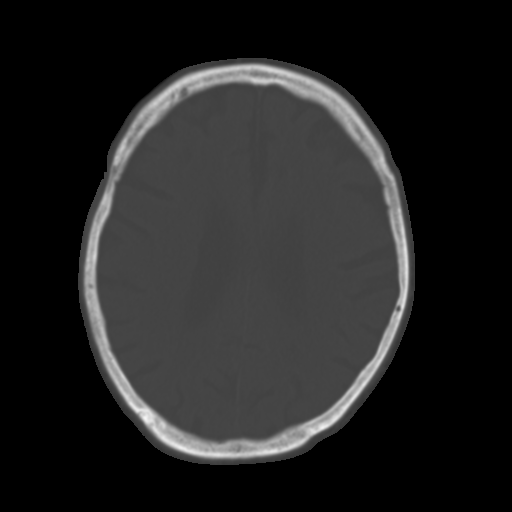
[im 26/35  brain]
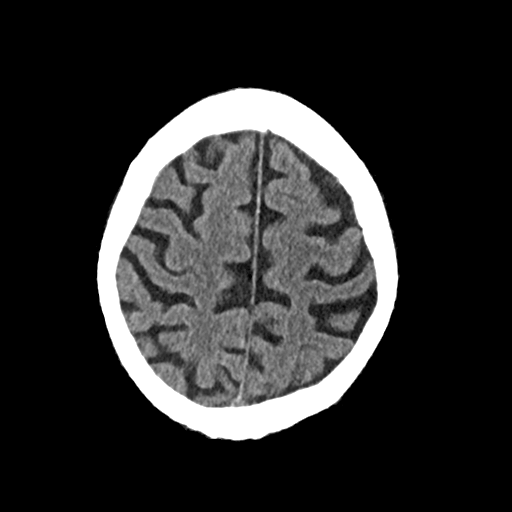
[im 30/35  brain]
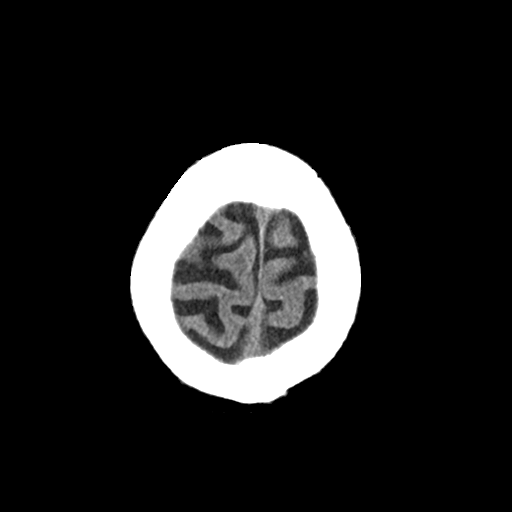

[Series 3: head 2.00 hr60 s3 axial bone · axial · 0.44mm/px · z∈[-617,-581]mm · 3 of 88 slices shown]
[im 9/88  bone]
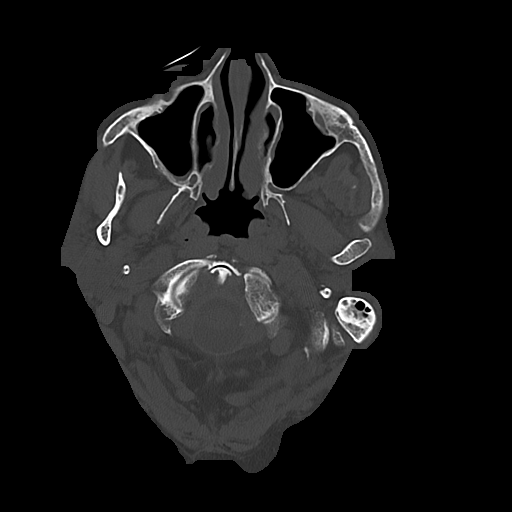
[im 18/88  bone]
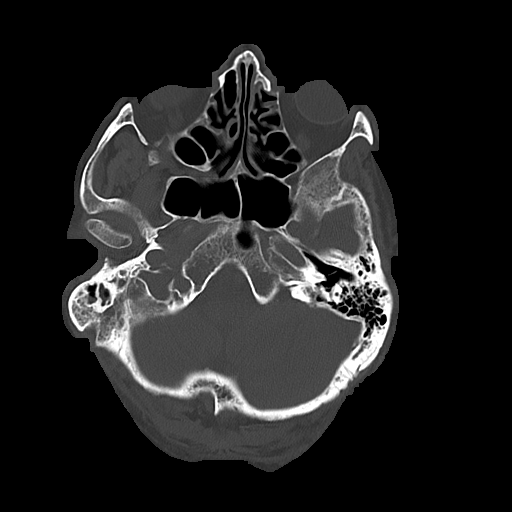
[im 27/88  bone]
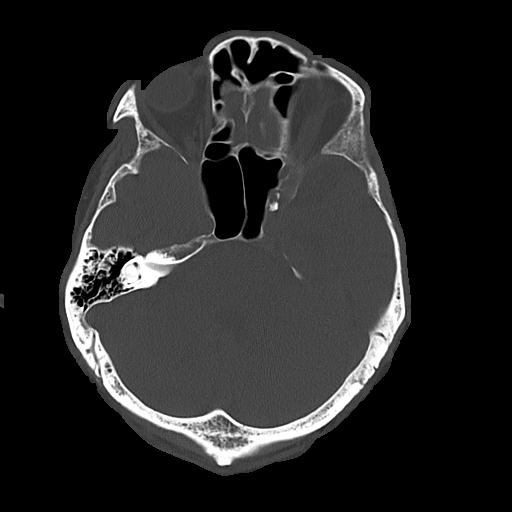

[Series 4: head 3.00 hr40 s3 sag · sagittal · 0.35mm/px · 3 of 63 slices shown]
[im 21/63  brain]
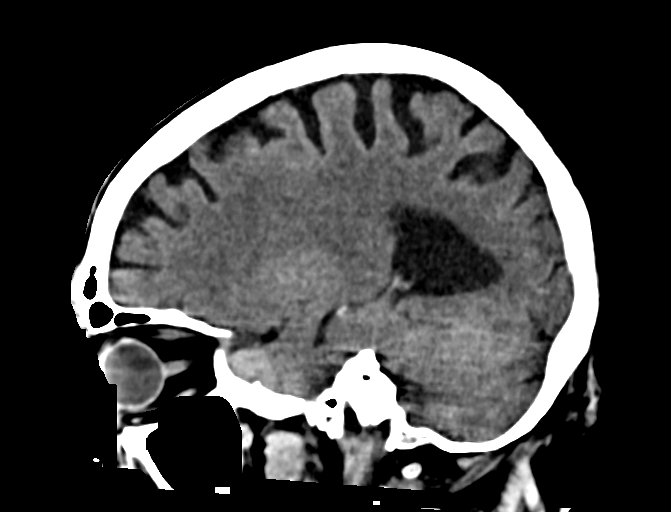
[im 32/63  brain]
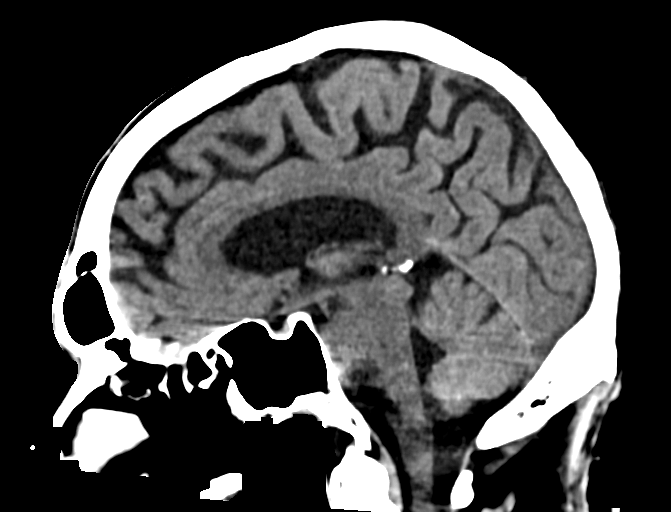
[im 42/63  brain]
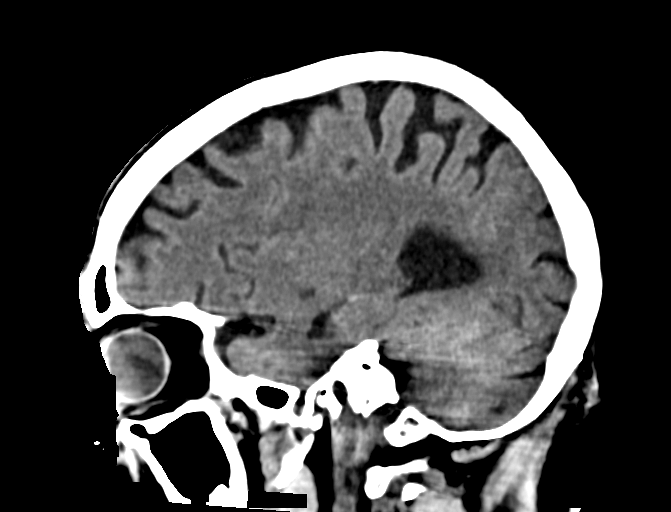

[Series 6: head 3.00 hr40 s3 cor · coronal · 0.35mm/px · 3 of 77 slices shown]
[im 26/77  brain]
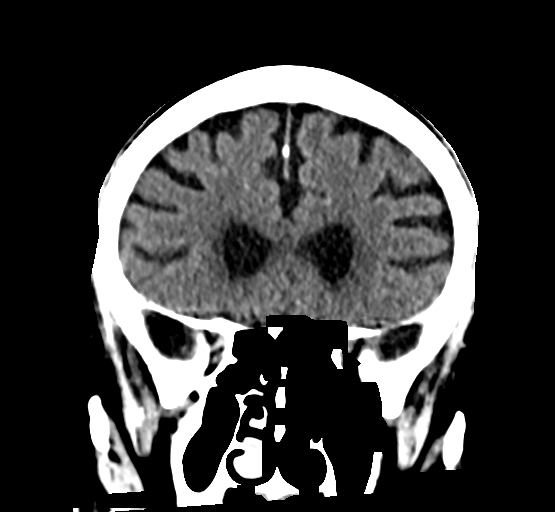
[im 34/77  brain]
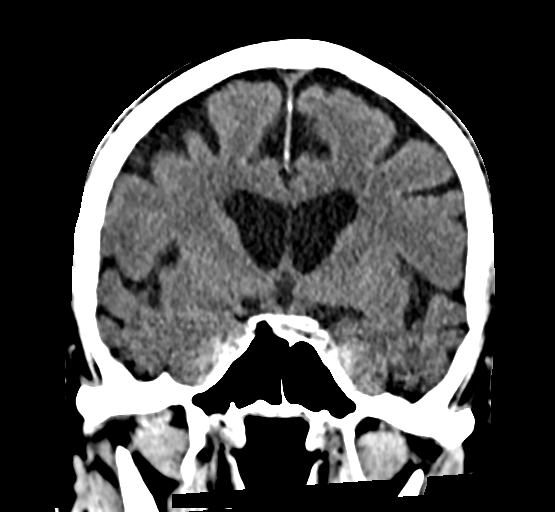
[im 43/77  brain]
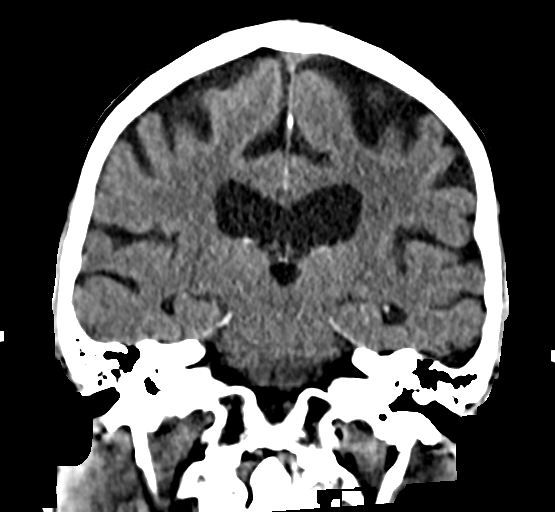

[16 of 47 positions shown; findings below may reference images not displayed]

FINDINGS: Brain: Normal anatomic configuration. Parenchymal volume loss is
commensurate with the patient's age. Remote lacunar infarct noted
within the a right basal ganglia. No abnormal intra or extra-axial
mass lesion or fluid collection. No abnormal mass effect or midline
shift. No evidence of acute intracranial hemorrhage or infarct.
Ventricular size is normal. Cerebellum unremarkable.

Vascular: No asymmetric hyperdense vasculature at the skull base.

Skull: Intact

Sinuses/Orbits: Paranasal sinuses are clear. Orbits are
unremarkable.

Other: Mastoid air cells and middle ear cavities are clear.
IMPRESSION: No acute intracranial abnormality. Mild senescent change. Remote
right basal ganglia lacunar infarct.

## 2020-11-29 NOTE — Telephone Encounter (Signed)
Spoke to patient head ct approved by insurance.Advised ok to go now to [place] [address] for head ct.

## 2020-11-29 NOTE — Telephone Encounter (Signed)
Spoke to patient he stated he blacked out this past Friday night while in bathroom.Stated he is not aware of what really happened.Stated his family found him on bathroom floor.They called EMS to help get him.He refused to go to ED.Stated he feels ok this morning.Advised to see PCP.

## 2020-11-29 NOTE — Telephone Encounter (Signed)
Patient had opted to discontinue monitoring through [employer] due to cost in April, 2021. Patient contacted and had plugged in home monitor. Patient reports he had 2 syncopal episodes with no symptoms prior to losing consciousness The initial episode occurred while he was walking to the [place] at 4 am on 11/27/20.Patient re-enrolled in [place] but unable to send transmission . Patient reports Provided with [employer] tech support # to trouble shoot  monitor. Patient to contact device clinic # after contacting [employer] to update [place] on results on monitor. ED precautions given for syncope, CP, chest pressure and/or stroke symptoms.

## 2020-11-29 NOTE — Telephone Encounter (Signed)
Patient called back to let us know that [employer] is sending him a new monitor.

## 2020-11-29 NOTE — Progress Notes (Signed)
Spoke to patient Dr.Croitoru advised needs to have a head ct today.Appointment scheduled at [place] today after approved by insurance before 3:00 pm.Message sent to device pool for a loop recorder down load.

## 2020-11-29 NOTE — Telephone Encounter (Signed)
Spoke to patient Dr.Croitoru advised needs a head ct today.Order placed and message sent to pre cert to be authorized by insurance.Advised to be a [facility] [address] at 1:00 pm today.Message sent to device pool to down load loop recorder.

## 2020-11-30 ENCOUNTER — Encounter: Payer: Self-pay | Admitting: *Deleted

## 2020-12-01 ENCOUNTER — Telehealth: Payer: Self-pay | Admitting: *Deleted

## 2020-12-01 ENCOUNTER — Inpatient Hospital Stay: Payer: Medicare HMO

## 2020-12-01 ENCOUNTER — Other Ambulatory Visit: Payer: Self-pay

## 2020-12-01 ENCOUNTER — Telehealth: Payer: Self-pay | Admitting: Hematology

## 2020-12-01 DIAGNOSIS — C91Z0 Other lymphoid leukemia not having achieved remission: Secondary | ICD-10-CM | POA: Diagnosis not present

## 2020-12-01 DIAGNOSIS — D649 Anemia, unspecified: Secondary | ICD-10-CM

## 2020-12-01 LAB — CBC WITH DIFFERENTIAL/PLATELET
Abs Immature Granulocytes: 0.14 10*3/uL — ABNORMAL HIGH (ref 0.00–0.07)
Basophils Absolute: 0 10*3/uL (ref 0.0–0.1)
Basophils Relative: 0 %
Eosinophils Absolute: 0 10*3/uL (ref 0.0–0.5)
Eosinophils Relative: 0 %
HCT: 25.1 % — ABNORMAL LOW (ref 39.0–52.0)
Hemoglobin: 8.1 g/dL — ABNORMAL LOW (ref 13.0–17.0)
Immature Granulocytes: 1 %
Lymphocytes Relative: 36 %
Lymphs Abs: 4 10*3/uL (ref 0.7–4.0)
MCH: 33.9 pg (ref 26.0–34.0)
MCHC: 32.3 g/dL (ref 30.0–36.0)
MCV: 105 fL — ABNORMAL HIGH (ref 80.0–100.0)
Monocytes Absolute: 1.4 10*3/uL — ABNORMAL HIGH (ref 0.1–1.0)
Monocytes Relative: 12 %
Neutro Abs: 5.7 10*3/uL (ref 1.7–7.7)
Neutrophils Relative %: 51 %
Platelets: 302 10*3/uL (ref 150–400)
RBC: 2.39 MIL/uL — ABNORMAL LOW (ref 4.22–5.81)
RDW: 26.7 % — ABNORMAL HIGH (ref 11.5–15.5)
WBC: 11.2 10*3/uL — ABNORMAL HIGH (ref 4.0–10.5)
nRBC: 0 % (ref 0.0–0.2)

## 2020-12-01 LAB — CMP (CANCER CENTER ONLY)
ALT: 27 U/L (ref 0–44)
AST: 18 U/L (ref 15–41)
Albumin: 3.3 g/dL — ABNORMAL LOW (ref 3.5–5.0)
Alkaline Phosphatase: 86 U/L (ref 38–126)
Anion gap: 9 (ref 5–15)
BUN: 8 mg/dL (ref 8–23)
CO2: 26 mmol/L (ref 22–32)
Calcium: 8.5 mg/dL — ABNORMAL LOW (ref 8.9–10.3)
Chloride: 102 mmol/L (ref 98–111)
Creatinine: 0.66 mg/dL (ref 0.61–1.24)
GFR, Estimated: >60 mL/min (ref >60)
Glucose, Bld: 252 mg/dL — ABNORMAL HIGH (ref 70–99)
Potassium: 4.4 mmol/L (ref 3.5–5.1)
Sodium: 137 mmol/L (ref 135–145)
Total Bilirubin: 1 mg/dL (ref 0.3–1.2)
Total Protein: 6.2 g/dL — ABNORMAL LOW (ref 6.5–8.1)

## 2020-12-01 LAB — SAMPLE TO BLOOD BANK

## 2020-12-01 NOTE — Progress Notes (Signed)
Hgb 8.1 and Dr. Irene Limbo informed. Per Dr. Irene Limbo no treatment today. Spoke to Dr. Grier Mitts nurse, Katharine Look. K. Schedulers to be notified and make patient an appointment for hospital follow up. Patient notified of treatment plan and in agreement.

## 2020-12-01 NOTE — Telephone Encounter (Addendum)
Patient here today for lab and transfusion of 2 units PRBCs - Hgb 8.1 - per Dr. Irene Limbo, transfusion not needed today Patient had cancelled appt for labs and MD visit on 10/20/21  Per patient his Methotrexate was stopped after recent hospitalization. He wants to know if Dr. Irene Limbo wants him to restart. Per Dr. Irene Limbo, needs f/u appts asap - high priority schedule message sent. Patient informed by Samantha Crimes, RN that scheduling will call him.

## 2020-12-01 NOTE — Telephone Encounter (Signed)
Scheduled appts per 4/27 sch msg. Pt aware.  

## 2020-12-02 ENCOUNTER — Encounter: Payer: Self-pay | Admitting: Cardiovascular Disease

## 2020-12-02 ENCOUNTER — Ambulatory Visit: Payer: Medicare HMO | Admitting: Cardiovascular Disease

## 2020-12-02 VITALS — BP 133/66 | HR 63 | Ht 69.5 in | Wt 165.0 lb

## 2020-12-02 DIAGNOSIS — E119 Type 2 diabetes mellitus without complications: Secondary | ICD-10-CM | POA: Diagnosis not present

## 2020-12-02 DIAGNOSIS — I480 Paroxysmal atrial fibrillation: Secondary | ICD-10-CM

## 2020-12-02 DIAGNOSIS — Z79899 Other long term (current) drug therapy: Secondary | ICD-10-CM

## 2020-12-02 DIAGNOSIS — Z5181 Encounter for therapeutic drug level monitoring: Secondary | ICD-10-CM

## 2020-12-02 DIAGNOSIS — R55 Syncope and collapse: Secondary | ICD-10-CM

## 2020-12-02 DIAGNOSIS — Z4509 Encounter for adjustment and management of other cardiac device: Secondary | ICD-10-CM

## 2020-12-02 DIAGNOSIS — C91Z0 Other lymphoid leukemia not having achieved remission: Secondary | ICD-10-CM

## 2020-12-02 LAB — TSH: TSH: 0.783 u[IU]/mL (ref 0.450–4.500)

## 2020-12-02 NOTE — Progress Notes (Signed)
Cardiology Office Note:    Date:  12/02/2020   ID:  Tommy Peterson, DOB [birth year], MRN [id number]  PCP:  [name], DO  Cardiologist:  Jarquis Walker  No chief complaint on file.   History of Present Illness:    Tommy Peterson is a 80 y.o. 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  A loop recorder was implanted in 2019 and for couple of years did not show any rhythm irregularities, other than paroxysmal atrial tachycardia.  At the patient's request, we stopped performing regular downloads to avoid billing.  He is very reluctant in the past to undergo anticoagulation due to problems with anemia, related to his leukemia and its treatment.  He subsequently presented with symptomatic atrial fibrillation with rapid ventricular response in January 2022, started anticoagulation with Eliquis and metoprolol for rate control.  Rate control was difficult to obtain and the patient became more more symptomatic, although he was also having worsening anemia problems.  He was scheduled for TEE cardioversion but presented in sinus rhythm on 01/28. atrial fibrillation recurred in February and amiodarone was started.  He was hospitalized 04/02-04/04 for UTI-related E.coli sepsis. He has chronic diarrhea (predates hospitalization and antibiotic), attributed to celiac disease and his GI specialist "cannot help him anymore".  He maintained sinus rhythm during the hospital stay.  On Monday night around 4 AM he got up to use the restroom and lost consciousness.  He found himself on the floor the bathroom, with his head against the tile floor.  He had a sore occipital area.  Emergency medical services were summoned and helped him get  back in bed.  They did not advise transportation to the emergency room.  I guess he had normal vital signs.  Later his daughters called with concerns about his fall.  Since he is on chronic anticoagulation we advised the CT of the head that did not show any evidence of intracranial hemorrhage.  It did show the scar over the remote ischemic stroke that we were aware of.  He has not had any new syncopal events since and has not had any falls.  He did not have any serious bleeding.  He reports getting up to use the restroom at least 4 times a night due to incomplete bladder voiding.  Interrogation of his loop recorder today shows no arrhythmia recorded since March 30 when he last had atrial fibrillation.  He has never had bradycardia or pauses.  He was in mostly uninterrupted atrial fibrillation between January and March, with occasional brief periods of normal rhythm.  Early on during this period he had a lot of issues with rapid ventricular response, but towards the end of ventricular rates were controlled, in the 80s (after starting amiodarone).  We have been unable to perform remote downloads.  He needs a new monitor from [employer].  He has not had any atrial fibrillation since March 30, 4 weeks now.  He is never aware of palpitations.  He had normal liver function tests checked on April 27.  He has not had a thyroid function test.  At worst, his CHA2DS2-VASc Score =5(age 47, HTN -questionable dx, DM, atherosclerosis on imaging), 7.2% stroke risk per year.At best, has a CHA2DS2-VASc score of 3.  HASBLED 1.    He does not  have a history of coronary artery disease or peripheral arterial disease.  In 2017 he was admitted with an episode of chest pain and his work-up for ischemia was negative.  Nevertheless, there was evidence of aortic and coronary calcifications on a CT of the chest performed in January 2018.  Past Medical History:  Diagnosis Date  . Acute respiratory failure (HCC)   . BCC (basal  cell carcinoma of skin) 08/15/2016   w/SCC Left Forehead (treatment @ [facility])  . BPH (benign prostatic hyperplasia)   . Carpal tunnel syndrome on both sides   . Chronic diarrhea    intermittant due to chronic collagenous colitis  . Chronic large granular lymphocytic leukemia (HCC) primary hemotologist-  dr Judie Petit. 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 Collagenous colitis    chronic--- intermittant between diarrahea and constipation  . ED (erectile dysfunction)   . HCAP (healthcare-associated pneumonia)   . History of adenomatous polyp of colon   . History of Clostridium difficile colitis 05/2016  . History of pneumonia 11/08/2017   CAP -- [facility]---  12-19-2017  per pt no residual symptoms  . History of SCC (squamous cell carcinoma) of skin   . History of sepsis    11-20-2017 severe sepsis due to UTI;  10/ 2017sepsis due to c-diff colitis  . Leucocytosis    chronic  . Lower urinary tract symptoms (LUTS)   . Macrocytic anemia    since 02/ 2015  . MGUS (monoclonal gammopathy of unknown significance)    hemotologist-  dr Michelene Heady @ [facility]  . Monoclonal paraproteinemia   . Neuropathy, peripheral   . Nodular basal cell carcinoma (BCC) 10/01/2018   Left Neck(Nodular) Pt Does Not Want Treatment  . Nodular basal cell carcinoma (BCC) 10/01/2018   Mid Forehead (treament curet and excision)  . OA (osteoarthritis)   . Prostatic stone   . Rash of face    RIGHT SIDE  . Raynaud's syndrome   . Recurrent BCC (basal cell carcinoma) 03/05/2019   positive margin  . SCC (squamous cell carcinoma) 08/10/2009   Central Forehead (Moh's Dr. Jeanie Sewer @ [facility])  . SCC (squamous cell carcinoma) 12/08/2011   CIS-Left Cheek (treatment Aldara @ [facility])  . SCC (squamous cell  carcinoma) 07/09/2015   CIS-Left Forehead (treament @ [facility])  . Septic shock (HCC)    x 3 w/UTI  . Squamous cell carcinoma in situ (SCCIS) 12/08/2011   Left Cheek (treatment Aldara @ [facility])   . Superficial basal cell carcinoma (BCC) 10/01/2018   Left Shin-Pt does not want treatment  . Thin skin    fragile  . Transfusion-dependent anemia since 10/ 2017   last transfusion PRBCs 07-19-2017  per hemologist note dated 10-25-2017  . Type 2 diabetes mellitus treated with insulin (HCC)    followed by dr Selena Batten (pcp)  . Urinary hesitancy   . Wears contact lenses    LEFT EYE ONLY    Past Surgical History:  Procedure Laterality Date  . BONE MARROW BIOPSY Right 05-24-2016;  08-20-2014  . CARDIOVASCULAR STRESS TEST  11-26-2012   dr Boneta Lucks  @ [facility] [place]   normal nuclear study w/ no ischemia/  normal LV function and wall motion , ef 60% (find in care everywhere,epic)  . CARDIOVERSION N/A 08/31/2020  Procedure: CARDIOVERSION;  Surgeon: [name], MD;  Location: [place];  Service: Cardiovascular;  Laterality: N/A;  . CATARACT EXTRACTION W/ INTRAOCULAR LENS  IMPLANT, BILATERAL  2015  . CYSTOSCOPY WITH LITHOLAPAXY N/A 12/25/2017   Procedure: CYSTOSCOPY WITH LITHOLAPAXY AND FULGERATION;  Surgeon: [name], MD;  Location: [facility];  Service: Urology;  Laterality: N/A;  . INGUINAL HERNIA REPAIR Right 1980s  . LOOP RECORDER INSERTION N/A 04/10/2018   Procedure: LOOP RECORDER INSERTION;  Surgeon: [name], MD;  Location: [place] [facility];  Service: Cardiovascular;  Laterality: N/A;  . PENILE PROSTHESIS IMPLANT  12-02-2015   dr [name] @ [facility] in [place]  . SHOULDER SURGERY Left 08/2016  . TEE WITHOUT CARDIOVERSION N/A 08/31/2020   Procedure: TRANSESOPHAGEAL ECHOCARDIOGRAM (TEE);  Surgeon: [name], MD;  Location: [place];  Service: Cardiovascular;  Laterality: N/A;  . TONSILLECTOMY  child  . TRANSURETHRAL  RESECTION OF PROSTATE  2009   AND REPAIR RECURRENT RIGHT INGUINAL HERNIA    Current Medications: Current Meds  Medication Sig  . amiodarone (PACERONE) 200 MG tablet Take 1 tablet (200 mg total) by mouth daily.  Marland Kitchen apixaban (ELIQUIS) 5 MG TABS tablet Take 1 tablet (5 mg total) by mouth 2 (two) times daily.  Marland Kitchen b complex vitamins capsule Take 1 capsule by mouth every evening.  . BD PEN NEEDLE NANO 2ND GEN 32G X 4 MM MISC   . budesonide (ENTOCORT EC) 3 MG 24 hr capsule Take 9 mg by mouth daily.  . Cranberry 500 MG TABS Take 500 mg by mouth daily.  . Cyanocobalamin (VITAMIN B-12) 5000 MCG SUBL Place 5,000 mcg under the tongue daily.  . D-Mannose 500 MG CAPS Take 500 mg by mouth in the morning and at bedtime.  . folic acid (FOLVITE) 1 MG tablet TAKE 1 TABLET BY MOUTH EVERY DAY (Patient taking differently: Take 1 mg by mouth daily.)  . furosemide (LASIX) 40 MG tablet Take 1 tablet (40 mg total) by mouth daily.  . Insulin Glargine-Lixisenatide 100-33 UNT-MCG/ML SOPN Inject 26 Units into the skin at bedtime. Soliqua 100/33  . methotrexate (RHEUMATREX) 2.5 MG tablet Take 10 mg by mouth once a week. Caution:Chemotherapy. Protect from light.  . metoprolol tartrate (LOPRESSOR) 50 MG tablet Take 50 mg by mouth 2 (two) times daily.  Marland Kitchen NOVOLOG FLEXPEN 100 UNIT/ML FlexPen Inject 6 Units into the skin 3 (three) times daily with meals.  Marland Kitchen oxybutynin (DITROPAN) 5 MG tablet Take 5 mg by mouth daily as needed (Too slow down urine).  . pioglitazone (ACTOS) 15 MG tablet Take 15 mg by mouth every morning.   . potassium chloride (KLOR-CON) 10 MEQ tablet Take 1 tablet (10 mEq total) by mouth daily.  . traMADol (ULTRAM) 50 MG tablet Take 50 mg by mouth at bedtime as needed (restlessness).     Allergies:   Bee venom, Metformin and related, and Sulfamethoxazole-trimethoprim   Social History   Socioeconomic History  . Marital status: Widowed    Spouse name: engaged  . Number of children: 8  . Years of education:  college-2  . Highest education level: Not on file  Occupational History  . Occupation: Press photographer  . 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 Smoking status: Former Smoker    Years: 20.00    Types: Cigarettes    Quit date: 05/04/1996    Years since quitting: 24.5  . Smokeless tobacco: Never Used  Vaping Use  . Vaping Use: Never  used  Substance and Sexual Activity  . Alcohol use: Yes    Comment: 1-2 glasses of wine every other day  . Drug use: No  . Sexual activity: Never  Other Topics Concern  . Not on file  Social History Narrative  . Not on file   Social Determinants of Health   Financial Resource Strain: Not on file  Food Insecurity: Not on file  Transportation Needs: Not on file  Physical Activity: Not on file  Stress: Not on file  Social Connections: Not on file     Family History: The patient's family history includes Diabetes in his mother; Heart failure in his mother.  ROS:   Please see the history of present illness.   All other systems are reviewed and are negative.  EKGs/Labs/Other Studies Reviewed:    The following studies were reviewed today: Comprehensive download of his loop recorder in the office today.  Echo 11/06/2020  1. Left ventricular ejection fraction, by estimation, is 60 to 65%. The  left ventricle has normal function. The left ventricle has no regional  wall motion abnormalities. The left ventricular internal cavity size was  mildly dilated. Left ventricular  diastolic parameters are consistent with Grade II diastolic dysfunction  (pseudonormalization).  2. Right ventricular systolic function is normal. The right ventricular  size is normal. There is moderately elevated pulmonary artery systolic  pressure.  3. Left atrial size was mild to moderately dilated.  4. Right atrial size was mild to moderately dilated.  5. The pericardial effusion is circumferential.  6. The mitral valve is normal in structure. Mild to moderate  mitral valve  regurgitation. No evidence of mitral stenosis.  7. The aortic valve is tricuspid. There is mild calcification of the  aortic valve. There is mild thickening of the aortic valve. Aortic valve  regurgitation is not visualized. Mild to moderate aortic valve  sclerosis/calcification is present, without any  evidence of aortic stenosis.  8. The inferior vena cava is dilated in size with >50% respiratory  variability, suggesting right atrial pressure of 8 mmHg.   Comparison(s): The left ventricular function has improved.   EKG:  EKG is ordered today.  It shows atrial fibrillation with rapid ventricular response at 136 bpm, unchanged QS pattern in leads V1-V2, borderline QTC 454 ms.  Recent Labs: 04/10/2020: Magnesium 2.3 11/06/2020: B Natriuretic Peptide 389.9 12/01/2020: ALT 27; BUN 8; Creatinine 0.66; Hemoglobin 8.1; Platelets 302; Potassium 4.4; Sodium 137  Recent Lipid Panel    Component Value Date/Time   CHOL 111 05/05/2016 0402   TRIG 111 04/07/2020 0055   HDL 27 (L) 05/05/2016 0402   CHOLHDL 4.1 05/05/2016 0402   VLDL 21 05/05/2016 0402   LDLCALC 63 05/05/2016 0402    Physical Exam:    VS:  BP 133/66   Pulse 63   Ht 5' 9.5" (1.765 m)   Wt 165 lb (74.8 kg)   BMI 24.02 kg/m     Wt Readings from Last 3 Encounters:  12/02/20 165 lb (74.8 kg)  11/15/20 161 lb 12.8 oz (73.4 kg)  11/08/20 168 lb 3.2 oz (76.3 kg)     General: Alert, oriented x3, no distress, pale Head: no evidence of trauma, PERRL, EOMI, no exophtalmos or lid lag, no myxedema, no xanthelasma; normal ears, nose and oropharynx Neck: normal jugular venous pulsations and no hepatojugular reflux; brisk carotid pulses without delay and no carotid bruits Chest: clear to auscultation, no signs of consolidation by percussion or palpation, normal fremitus, symmetrical and  full respiratory excursions Cardiovascular: normal position and quality of the apical impulse, regular rhythm, normal first and second  heart sounds, no murmurs, rubs or gallops Abdomen: no tenderness or distention, no masses by palpation, no abnormal pulsatility or arterial bruits, normal bowel sounds, no hepatosplenomegaly Extremities: no clubbing, cyanosis or edema; 2+ radial, ulnar and brachial pulses bilaterally; 2+ right femoral, posterior tibial and dorsalis pedis pulses; 2+ left femoral, posterior tibial and dorsalis pedis pulses; no subclavian or femoral bruits Neurological: grossly nonfocal Psych: Normal mood and affect   CHA2DS2-VASc Score = 5  The patient's score is based upon: CHF History: No HTN History: Yes Diabetes History: Yes Stroke History: No Vascular Disease History: Yes Age Score: 2 Gender Score: 0      ASSESSMENT AND PLAN: Paroxysmal Atrial Fibrillation (ICD10:  I48.0) The patient's CHA2DS2-VASc score is 5, indicating a 7.2% annual risk of stroke.    Secondary Hypercoagulable State (ICD10:  D68.69) The patient is at significant risk for stroke/thromboembolism based upon his CHA2DS2-VASc Score of 5.  Continue Apixaban (Eliquis).    Signed,  [name], MD    12/02/2020 11:51 AM    ASSESSMENT:    1. Syncope and collapse   2. Paroxysmal atrial fibrillation (HCC)   3. Encounter for monitoring amiodarone therapy   4. Encounter for loop recorder check   5. Large granular lymphocytic leukemia ([place])   6. Type 2 diabetes mellitus without complication, without long-term current use of insulin (HCC)    PLAN:    In order of problems listed above:  1. Syncope: Has many hallmarks of orthostatic hypotension/vasovagal syncope, occurring after micturition, in the early hours of the morning, with quick recovery once he was horizontal.  Since he did not have a serious injury, I suspect that he had sufficient prodrome to find a safe way to fall.  His loop recorder does not show any evidence of ventricular tachycardia, bradycardia, pauses to explain syncope.  He needs to stay very well-hydrated and  avoid sudden changes in position.  I advised cutting back on the furosemide which is listed among his medicines, but he insists that he has not taken either furosemide or potassium in a very long time.  We will take this off his medication list. 2. AFib: He had protracted persistent atrial fibrillation off and on for a couple of months, starting with his episode of sepsis and January, continuing through the end of March.  He has now been in uninterrupted normal rhythm for about a month.  He continues to have frequent PACs and today in clinic was in atrial bigeminy for a long time.  He is unaware of arrhythmia.  He is appropriately anticoagulated, but his recent fall raises concerned about the safety of anticoagulation long-term. 3. Amiodarone: check LFTs and TSH at least every 6 months. Ordered TSH today again. 4. ILR: Comprehensive device check today.  Waiting for him to receive a new monitor. 5. Leukemia/chronic anemia: He did not have iron deficiency per labs Oct 2021.  Sees Dr. Irene Limbo. 6. DM: recent deterioration (A1c 8.3% on 11/06/2020), in part due to acute illness. He is lean. On insulin and Actos.  Does have aortic and coronary atherosclerosis on imaging studies, but no clinical CAD/PAD. 7. Aortic and coronary atherosclerosis: Noted on CT chest.  He has never had angina pectoris, but he has never had any functional studies for CAD either.  LDL cholesterol was 63 in 2017 and 70 in 2019, without medications.  He has normal left ventricular systolic  function.  All his risk factors appear to be adequately addressed. 8. Recent E.coli sepsis:  He reports this is his 4th event of this type.   Patient Instructions  Medication Instructions:  No changes-please let us know if you are taking the Furosemide  *If you need a refill on your cardiac medications before your next appointment, please call your pharmacy*   Lab Work: Your provider would like for you to have the following labs today: TSH  If you  have labs (blood work) drawn today and your tests are completely normal, you will receive your results only by: Marland Kitchen MyChart Message (if you have MyChart) OR . A paper copy in the mail If you have any lab test that is abnormal or we need to change your treatment, we will call you to review the results.   Testing/Procedures: None ordered   Follow-Up: At [facility], you and your health needs are our priority.  As part of our continuing mission to provide you with exceptional heart care, we have created designated Provider Care Teams.  These Care Teams include your primary Cardiologist (physician) and Advanced Practice Providers (APPs -  Physician Assistants and Nurse Practitioners) who all work together to provide you with the care you need, when you need it.  We recommend signing up for the patient portal called "MyChart".  Sign up information is provided on this After Visit Summary.  MyChart is used to connect with patients for Virtual Visits (Telemedicine).  Patients are able to view lab/test results, encounter notes, upcoming appointments, etc.  Non-urgent messages can be sent to your provider as well.   To learn more about what you can do with MyChart, go to [email or web address].    Your next appointment:   3 month(s)  The format for your next appointment:   In Person  Provider:   Dr. Sallyanne Kuster    Medication Adjustments/Labs and Tests Ordered: Current medicines are reviewed at length with the patient today.  Concerns regarding medicines are outlined above.  Orders Placed This Encounter  Procedures  . TSH   No orders of the defined types were placed in this encounter.   Patient Instructions  Medication Instructions:  No changes-please let us know if you are taking the Furosemide  *If you need a refill on your cardiac medications before your next appointment, please call your pharmacy*   Lab Work: Your provider would like for you to have the following labs today:  TSH  If you have labs (blood work) drawn today and your tests are completely normal, you will receive your results only by: Marland Kitchen MyChart Message (if you have MyChart) OR . A paper copy in the mail If you have any lab test that is abnormal or we need to change your treatment, we will call you to review the results.   Testing/Procedures: None ordered   Follow-Up: At [facility], you and your health needs are our priority.  As part of our continuing mission to provide you with exceptional heart care, we have created designated Provider Care Teams.  These Care Teams include your primary Cardiologist (physician) and Advanced Practice Providers (APPs -  Physician Assistants and Nurse Practitioners) who all work together to provide you with the care you need, when you need it.  We recommend signing up for the patient portal called "MyChart".  Sign up information is provided on this After Visit Summary.  MyChart is used to connect with patients for Virtual Visits (Telemedicine).  Patients are able to view  lab/test results, encounter notes, upcoming appointments, etc.  Non-urgent messages can be sent to your provider as well.   To learn more about what you can do with MyChart, go to [email or web address].    Your next appointment:   3 month(s)  The format for your next appointment:   In Person  Provider:   Dr. Sallyanne Kuster      Signed, [name], MD  12/02/2020 11:51 AM    [place]

## 2020-12-02 NOTE — Patient Instructions (Signed)
Medication Instructions:  No changes-please let us know if you are taking the Furosemide  *If you need a refill on your cardiac medications before your next appointment, please call your pharmacy*   Lab Work: Your provider would like for you to have the following labs today: TSH  If you have labs (blood work) drawn today and your tests are completely normal, you will receive your results only by: Marland Kitchen MyChart Message (if you have MyChart) OR . A paper copy in the mail If you have any lab test that is abnormal or we need to change your treatment, we will call you to review the results.   Testing/Procedures: None ordered   Follow-Up: At [facility], you and your health needs are our priority.  As part of our continuing mission to provide you with exceptional heart care, we have created designated Provider Care Teams.  These Care Teams include your primary Cardiologist (physician) and Advanced Practice Providers (APPs -  Physician Assistants and Nurse Practitioners) who all work together to provide you with the care you need, when you need it.  We recommend signing up for the patient portal called "MyChart".  Sign up information is provided on this After Visit Summary.  MyChart is used to connect with patients for Virtual Visits (Telemedicine).  Patients are able to view lab/test results, encounter notes, upcoming appointments, etc.  Non-urgent messages can be sent to your provider as well.   To learn more about what you can do with MyChart, go to [email or web address].    Your next appointment:   3 month(s)  The format for your next appointment:   In Person  Provider:   Dr. Sallyanne Kuster

## 2020-12-06 ENCOUNTER — Encounter: Payer: Self-pay | Admitting: *Deleted

## 2020-12-09 ENCOUNTER — Encounter (HOSPITAL_COMMUNITY): Payer: Self-pay | Admitting: Student

## 2020-12-09 ENCOUNTER — Emergency Department (HOSPITAL_COMMUNITY): Payer: No Typology Code available for payment source

## 2020-12-09 ENCOUNTER — Other Ambulatory Visit: Payer: Self-pay

## 2020-12-09 ENCOUNTER — Inpatient Hospital Stay (HOSPITAL_COMMUNITY)
Admission: EM | Admit: 2020-12-09 | Discharge: 2020-12-12 | DRG: 871 | Disposition: A | Discharge status: Hospice | Payer: No Typology Code available for payment source | Attending: Internal Medicine | Admitting: Internal Medicine

## 2020-12-09 DIAGNOSIS — I110 Hypertensive heart disease with heart failure: Secondary | ICD-10-CM | POA: Diagnosis present

## 2020-12-09 DIAGNOSIS — Z7984 Long term (current) use of oral hypoglycemic drugs: Secondary | ICD-10-CM

## 2020-12-09 DIAGNOSIS — E119 Type 2 diabetes mellitus without complications: Secondary | ICD-10-CM

## 2020-12-09 DIAGNOSIS — Z7901 Long term (current) use of anticoagulants: Secondary | ICD-10-CM

## 2020-12-09 DIAGNOSIS — Z8249 Family history of ischemic heart disease and other diseases of the circulatory system: Secondary | ICD-10-CM | POA: Diagnosis not present

## 2020-12-09 DIAGNOSIS — Z8619 Personal history of other infectious and parasitic diseases: Secondary | ICD-10-CM

## 2020-12-09 DIAGNOSIS — L89152 Pressure ulcer of sacral region, stage 2: Secondary | ICD-10-CM | POA: Diagnosis present

## 2020-12-09 DIAGNOSIS — Z85828 Personal history of other malignant neoplasm of skin: Secondary | ICD-10-CM

## 2020-12-09 DIAGNOSIS — Z882 Allergy status to sulfonamides status: Secondary | ICD-10-CM | POA: Diagnosis not present

## 2020-12-09 DIAGNOSIS — G9341 Metabolic encephalopathy: Secondary | ICD-10-CM | POA: Diagnosis present

## 2020-12-09 DIAGNOSIS — A419 Sepsis, unspecified organism: Secondary | ICD-10-CM | POA: Diagnosis not present

## 2020-12-09 DIAGNOSIS — Z743 Need for continuous supervision: Secondary | ICD-10-CM | POA: Diagnosis not present

## 2020-12-09 DIAGNOSIS — K900 Celiac disease: Secondary | ICD-10-CM | POA: Diagnosis present

## 2020-12-09 DIAGNOSIS — A4151 Sepsis due to Escherichia coli [E. coli]: Secondary | ICD-10-CM | POA: Diagnosis not present

## 2020-12-09 DIAGNOSIS — C91Z0 Other lymphoid leukemia not having achieved remission: Secondary | ICD-10-CM

## 2020-12-09 DIAGNOSIS — Z86010 Personal history of colonic polyps: Secondary | ICD-10-CM | POA: Diagnosis not present

## 2020-12-09 DIAGNOSIS — Z79899 Other long term (current) drug therapy: Secondary | ICD-10-CM | POA: Diagnosis not present

## 2020-12-09 DIAGNOSIS — M24412 Recurrent dislocation, left shoulder: Secondary | ICD-10-CM | POA: Diagnosis present

## 2020-12-09 DIAGNOSIS — C9110 Chronic lymphocytic leukemia of B-cell type not having achieved remission: Secondary | ICD-10-CM | POA: Diagnosis present

## 2020-12-09 DIAGNOSIS — Z794 Long term (current) use of insulin: Secondary | ICD-10-CM | POA: Diagnosis not present

## 2020-12-09 DIAGNOSIS — I480 Paroxysmal atrial fibrillation: Secondary | ICD-10-CM | POA: Diagnosis present

## 2020-12-09 DIAGNOSIS — R6520 Severe sepsis without septic shock: Secondary | ICD-10-CM | POA: Diagnosis present

## 2020-12-09 DIAGNOSIS — N400 Enlarged prostate without lower urinary tract symptoms: Secondary | ICD-10-CM | POA: Diagnosis present

## 2020-12-09 DIAGNOSIS — N390 Urinary tract infection, site not specified: Secondary | ICD-10-CM | POA: Diagnosis present

## 2020-12-09 DIAGNOSIS — Z87891 Personal history of nicotine dependence: Secondary | ICD-10-CM

## 2020-12-09 DIAGNOSIS — R0689 Other abnormalities of breathing: Secondary | ICD-10-CM | POA: Diagnosis not present

## 2020-12-09 DIAGNOSIS — I959 Hypotension, unspecified: Secondary | ICD-10-CM | POA: Diagnosis not present

## 2020-12-09 DIAGNOSIS — I10 Essential (primary) hypertension: Secondary | ICD-10-CM | POA: Diagnosis not present

## 2020-12-09 DIAGNOSIS — Z20822 Contact with and (suspected) exposure to covid-19: Secondary | ICD-10-CM | POA: Diagnosis present

## 2020-12-09 DIAGNOSIS — D630 Anemia in neoplastic disease: Secondary | ICD-10-CM | POA: Diagnosis present

## 2020-12-09 DIAGNOSIS — R296 Repeated falls: Secondary | ICD-10-CM

## 2020-12-09 DIAGNOSIS — Z91030 Bee allergy status: Secondary | ICD-10-CM | POA: Diagnosis not present

## 2020-12-09 DIAGNOSIS — Z833 Family history of diabetes mellitus: Secondary | ICD-10-CM | POA: Diagnosis not present

## 2020-12-09 DIAGNOSIS — R531 Weakness: Secondary | ICD-10-CM | POA: Diagnosis not present

## 2020-12-09 DIAGNOSIS — Z888 Allergy status to other drugs, medicaments and biological substances status: Secondary | ICD-10-CM | POA: Diagnosis not present

## 2020-12-09 DIAGNOSIS — E785 Hyperlipidemia, unspecified: Secondary | ICD-10-CM | POA: Diagnosis present

## 2020-12-09 DIAGNOSIS — R509 Fever, unspecified: Secondary | ICD-10-CM | POA: Diagnosis not present

## 2020-12-09 DIAGNOSIS — E8809 Other disorders of plasma-protein metabolism, not elsewhere classified: Secondary | ICD-10-CM | POA: Diagnosis present

## 2020-12-09 DIAGNOSIS — I5032 Chronic diastolic (congestive) heart failure: Secondary | ICD-10-CM | POA: Diagnosis present

## 2020-12-09 DIAGNOSIS — D649 Anemia, unspecified: Secondary | ICD-10-CM

## 2020-12-09 LAB — URINALYSIS, ROUTINE W REFLEX MICROSCOPIC
Bilirubin Urine: NEGATIVE
Glucose, UA: NEGATIVE mg/dL
Ketones, ur: NEGATIVE mg/dL
Nitrite: NEGATIVE
Protein, ur: NEGATIVE mg/dL
Specific Gravity, Urine: 1.005 (ref 1.005–1.030)
WBC, UA: >50 WBC/hpf — ABNORMAL HIGH (ref 0–5)
pH: 7 (ref 5.0–8.0)

## 2020-12-09 LAB — APTT: aPTT: 36 seconds (ref 24–36)

## 2020-12-09 LAB — COMPREHENSIVE METABOLIC PANEL
ALT: 28 U/L (ref 0–44)
AST: 24 U/L (ref 15–41)
Albumin: 2.6 g/dL — ABNORMAL LOW (ref 3.5–5.0)
Alkaline Phosphatase: 82 U/L (ref 38–126)
Anion gap: 9 (ref 5–15)
BUN: 5 mg/dL — ABNORMAL LOW (ref 8–23)
CO2: 23 mmol/L (ref 22–32)
Calcium: 7.7 mg/dL — ABNORMAL LOW (ref 8.9–10.3)
Chloride: 103 mmol/L (ref 98–111)
Creatinine, Ser: 0.55 mg/dL — ABNORMAL LOW (ref 0.61–1.24)
GFR, Estimated: >60 mL/min (ref >60)
Glucose, Bld: 133 mg/dL — ABNORMAL HIGH (ref 70–99)
Potassium: 3.8 mmol/L (ref 3.5–5.1)
Sodium: 135 mmol/L (ref 135–145)
Total Bilirubin: 0.8 mg/dL (ref 0.3–1.2)
Total Protein: 5.1 g/dL — ABNORMAL LOW (ref 6.5–8.1)

## 2020-12-09 LAB — CBC WITH DIFFERENTIAL/PLATELET
Abs Immature Granulocytes: 0.28 10*3/uL — ABNORMAL HIGH (ref 0.00–0.07)
Abs Immature Granulocytes: 0 10*3/uL (ref 0.00–0.07)
Basophils Absolute: 0 10*3/uL (ref 0.0–0.1)
Basophils Absolute: 0 10*3/uL (ref 0.0–0.1)
Basophils Relative: 0 %
Basophils Relative: 0 %
Eosinophils Absolute: 0 10*3/uL (ref 0.0–0.5)
Eosinophils Absolute: 0 10*3/uL (ref 0.0–0.5)
Eosinophils Relative: 0 %
Eosinophils Relative: 0 %
HCT: 20 % — ABNORMAL LOW (ref 39.0–52.0)
HCT: 25.1 % — ABNORMAL LOW (ref 39.0–52.0)
Hemoglobin: 6.9 g/dL — CL (ref 13.0–17.0)
Hemoglobin: 7.9 g/dL — ABNORMAL LOW (ref 13.0–17.0)
Immature Granulocytes: 2 %
Lymphocytes Relative: 26 %
Lymphocytes Relative: 21 %
Lymphs Abs: 3.6 10*3/uL (ref 0.7–4.0)
Lymphs Abs: 3.9 10*3/uL (ref 0.7–4.0)
MCH: 39 pg — ABNORMAL HIGH (ref 26.0–34.0)
MCH: 35.1 pg — ABNORMAL HIGH (ref 26.0–34.0)
MCHC: 34.5 g/dL (ref 30.0–36.0)
MCHC: 31.5 g/dL (ref 30.0–36.0)
MCV: 113 fL — ABNORMAL HIGH (ref 80.0–100.0)
MCV: 111.6 fL — ABNORMAL HIGH (ref 80.0–100.0)
Monocytes Absolute: 1.3 10*3/uL — ABNORMAL HIGH (ref 0.1–1.0)
Monocytes Absolute: 2.8 10*3/uL — ABNORMAL HIGH (ref 0.1–1.0)
Monocytes Relative: 9 %
Monocytes Relative: 15 %
Neutro Abs: 8.7 10*3/uL — ABNORMAL HIGH (ref 1.7–7.7)
Neutro Abs: 11.8 10*3/uL — ABNORMAL HIGH (ref 1.7–7.7)
Neutrophils Relative %: 63 %
Neutrophils Relative %: 64 %
Platelets: ADEQUATE 10*3/uL (ref 150–400)
Platelets: 320 10*3/uL (ref 150–400)
RBC: 1.77 MIL/uL — ABNORMAL LOW (ref 4.22–5.81)
RBC: 2.25 MIL/uL — ABNORMAL LOW (ref 4.22–5.81)
WBC: 13.9 10*3/uL — ABNORMAL HIGH (ref 4.0–10.5)
WBC: 18.4 10*3/uL — ABNORMAL HIGH (ref 4.0–10.5)
nRBC: 0 % (ref 0.0–0.2)
nRBC: 0 % (ref 0.0–0.2)
nRBC: 0 /100 WBC

## 2020-12-09 LAB — POC OCCULT BLOOD, ED: Fecal Occult Bld: NEGATIVE

## 2020-12-09 LAB — RESP PANEL BY RT-PCR (FLU A&B, COVID) ARPGX2
Influenza A by PCR: NEGATIVE
Influenza B by PCR: NEGATIVE
SARS Coronavirus 2 by RT PCR: NEGATIVE

## 2020-12-09 LAB — LACTIC ACID, PLASMA: Lactic Acid, Venous: 1 mmol/L (ref 0.5–1.9)

## 2020-12-09 LAB — PROTIME-INR
INR: 1.6 — ABNORMAL HIGH (ref 0.8–1.2)
Prothrombin Time: 19.3 seconds — ABNORMAL HIGH (ref 11.4–15.2)

## 2020-12-09 LAB — PREPARE RBC (CROSSMATCH)

## 2020-12-09 IMAGING — CT CT HEAD W/O CM
4 series · 16 of 47 positions shown, 18 images · non-contrast
Comparison: [DATE]

CLINICAL DATA: Altered mental status. Unspecified minor head
trauma.

EXAM:
CT HEAD WITHOUT CONTRAST
TECHNIQUE: Contiguous axial images were obtained from the base of the skull
through the vertex without intravenous contrast.

[Series 3: head wo · axial · 0.47mm/px · z∈[-133,-13]mm · 7 of 33 slices shown, 9 images]
[im 5/33  brain]
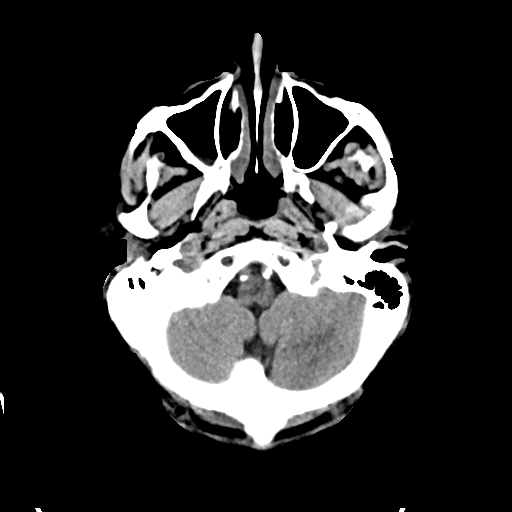
[im 5/33  bone]
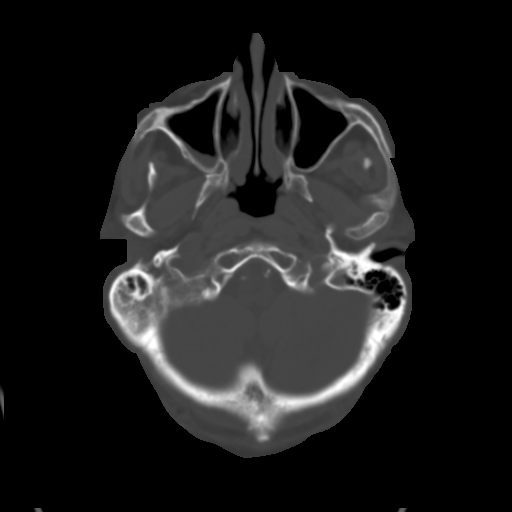
[im 9/33  brain]
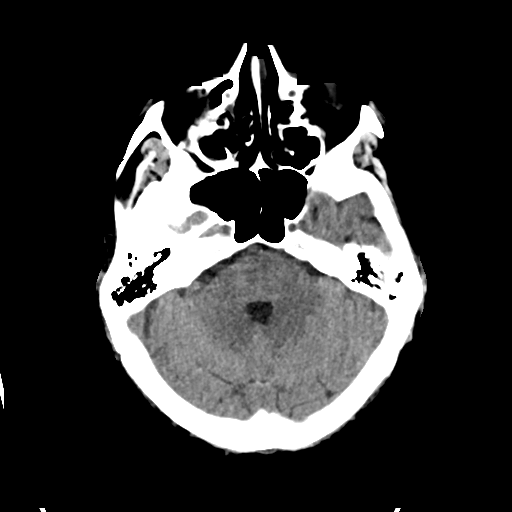
[im 13/33  brain]
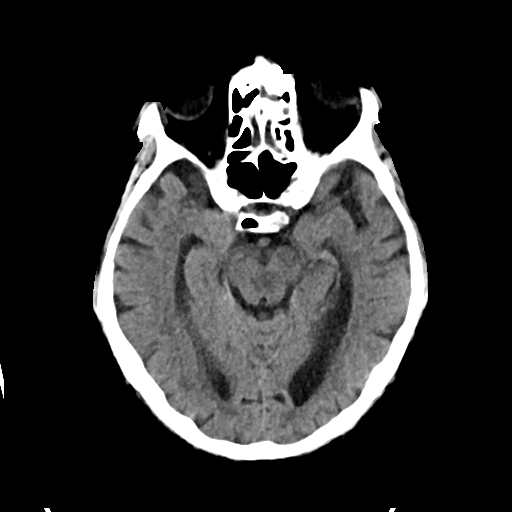
[im 17/33  brain]
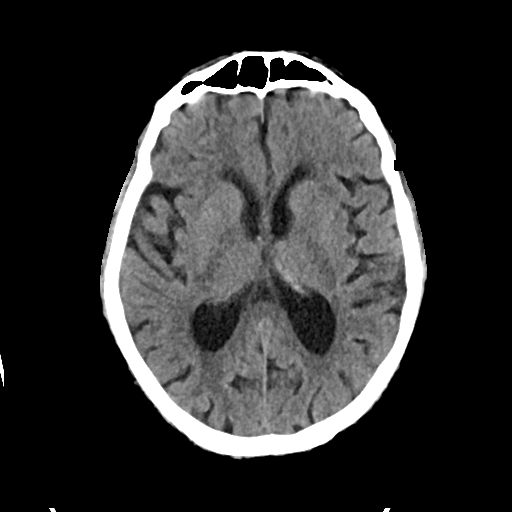
[im 21/33  brain]
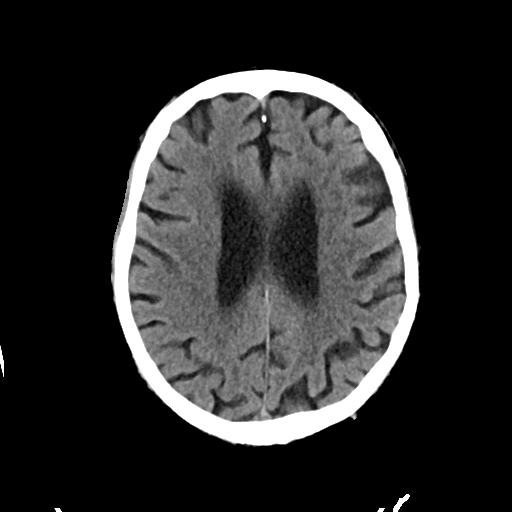
[im 21/33  bone]
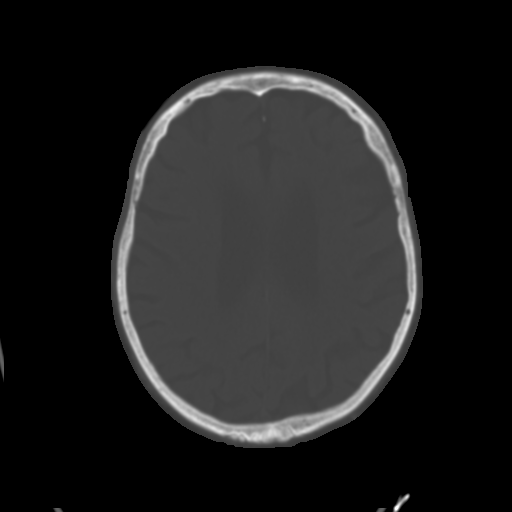
[im 25/33  brain]
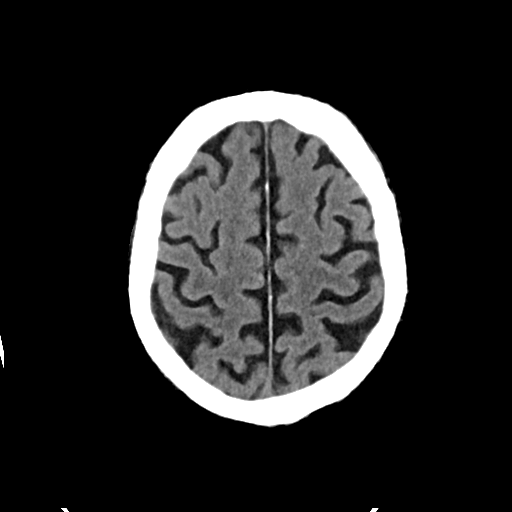
[im 29/33  brain]
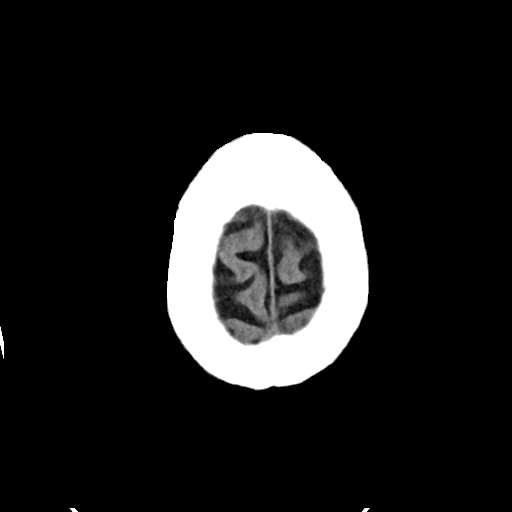

[Series 4: head bone · axial · 0.47mm/px · z∈[-137,-105]mm · 3 of 82 slices shown]
[im 9/82  bone]
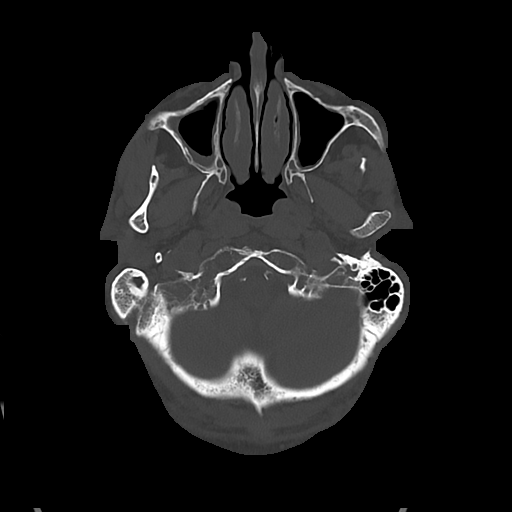
[im 17/82  bone]
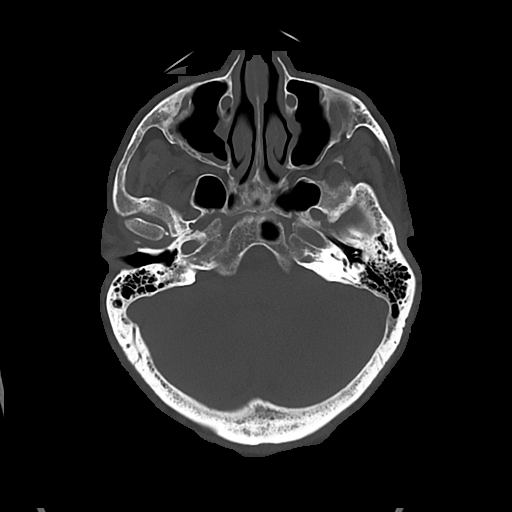
[im 25/82  bone]
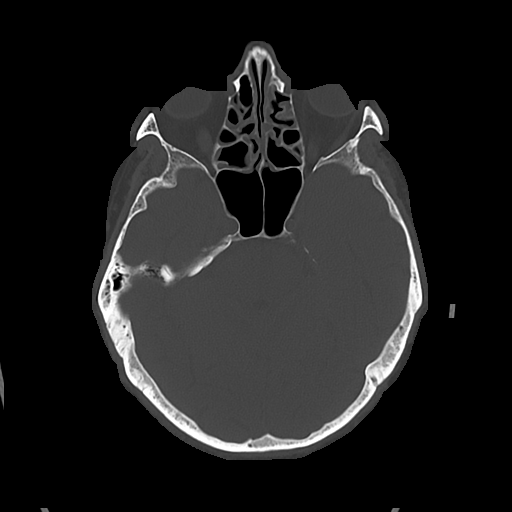

[Series 5: cor soft · coronal · 0.35mm/px · 3 of 72 slices shown]
[im 24/72  brain]
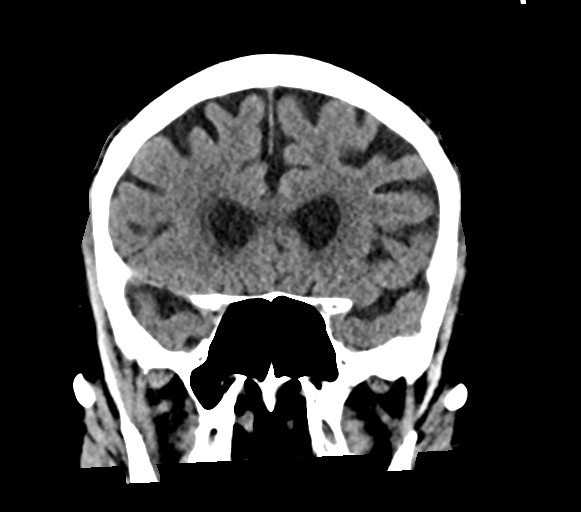
[im 32/72  brain]
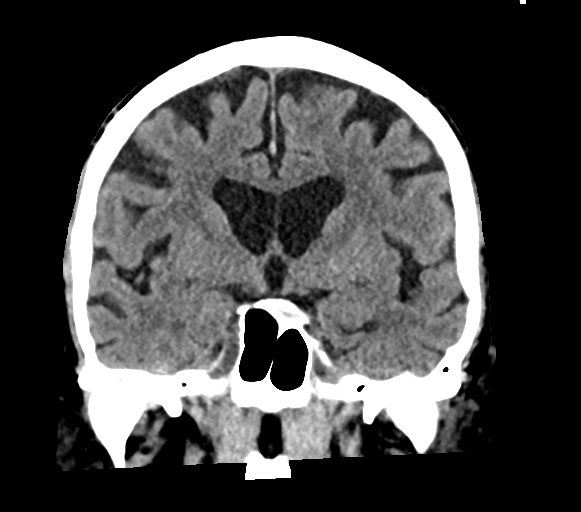
[im 40/72  brain]
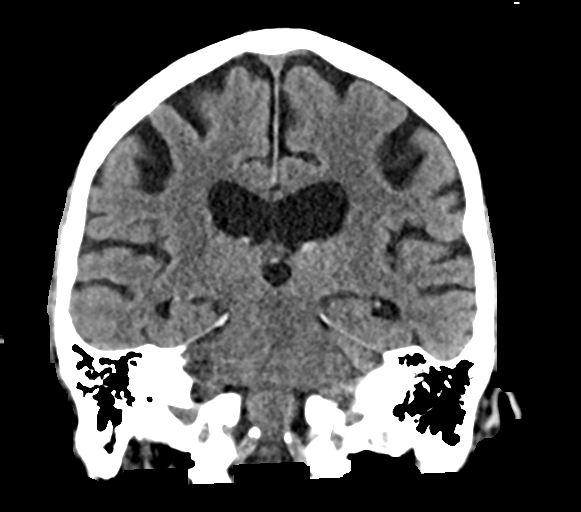

[Series 6: sag soft · sagittal · 0.36mm/px · 3 of 58 slices shown]
[im 20/58  brain]
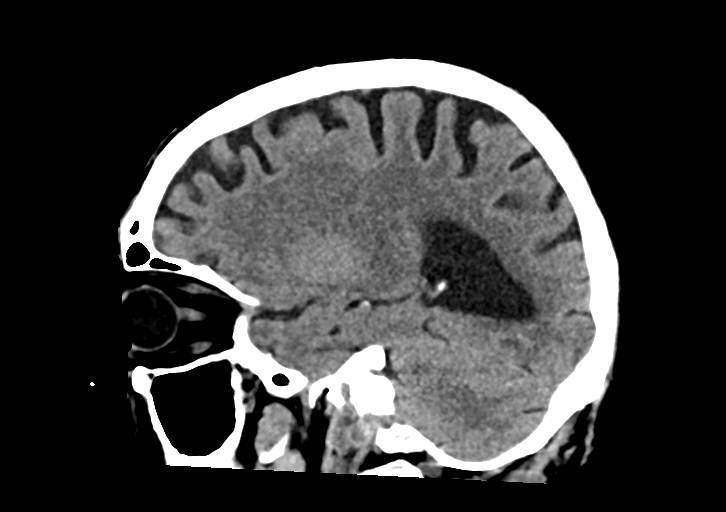
[im 29/58  brain]
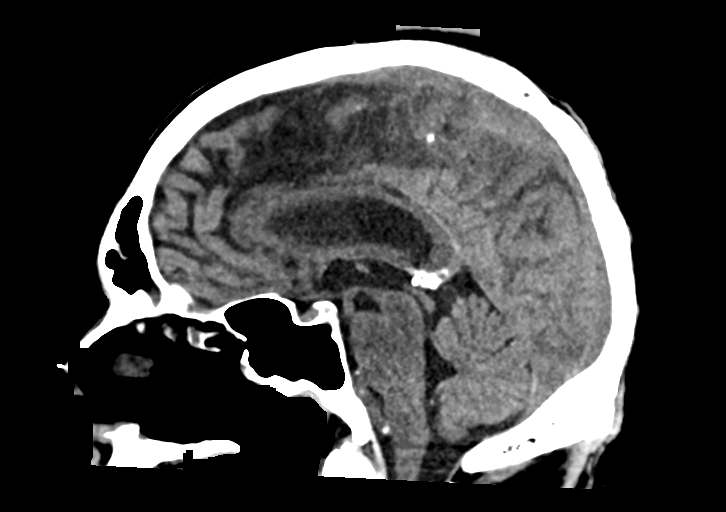
[im 39/58  brain]
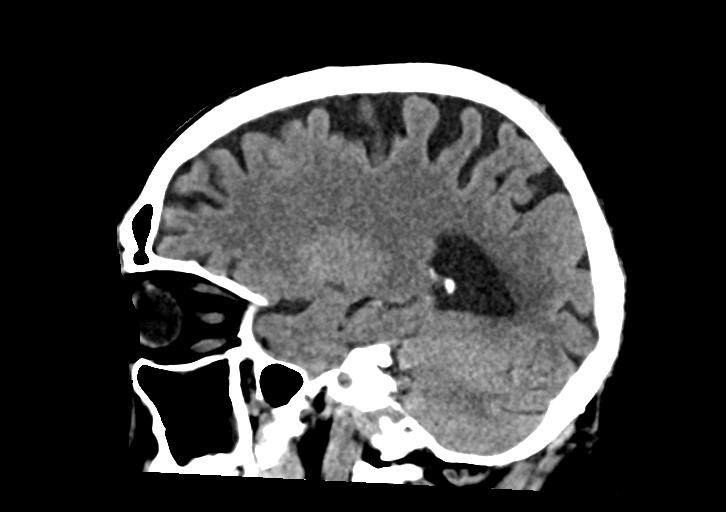

[16 of 47 positions shown; findings below may reference images not displayed]

FINDINGS: Brain: No significant change in mild-to-moderate enlargement of the
ventricles and subarachnoid spaces. Stable mild patchy white matter
low density in both cerebral hemispheres. No intracranial
hemorrhage, mass lesion or CT evidence of acute infarction.

Vascular: No hyperdense vessel or unexpected calcification.

Skull: Normal. Negative for fracture or focal lesion.

Sinuses/Orbits: Status post bilateral cataract extraction. Mild
bilateral frontal, ethmoid and maxillary sinus mucosal thickening.

Other: None.
IMPRESSION: 1. No acute abnormality.
2. Mild-to-moderate atrophy and mild chronic small vessel white
matter ischemic changes in both cerebral hemispheres.

## 2020-12-09 IMAGING — DX DG SHOULDER 2+V*R*
3 series · 3 of 3 positions shown · non-contrast
Comparison: None.

CLINICAL DATA: fall

EXAM:
RIGHT SHOULDER - 2+ VIEW

[shoulder grashey]
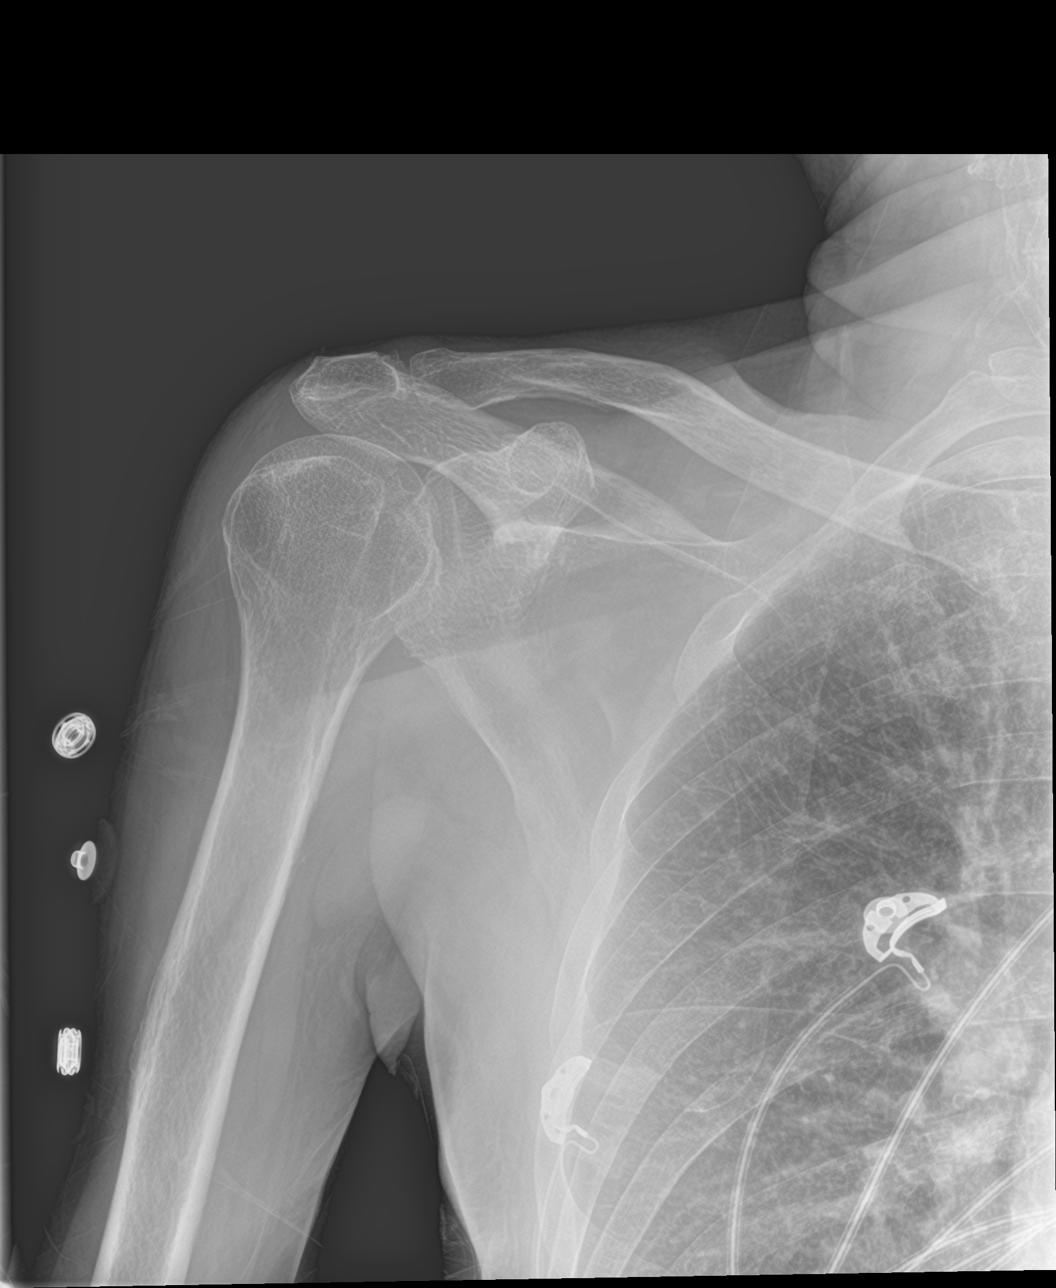

[shoulder y view]
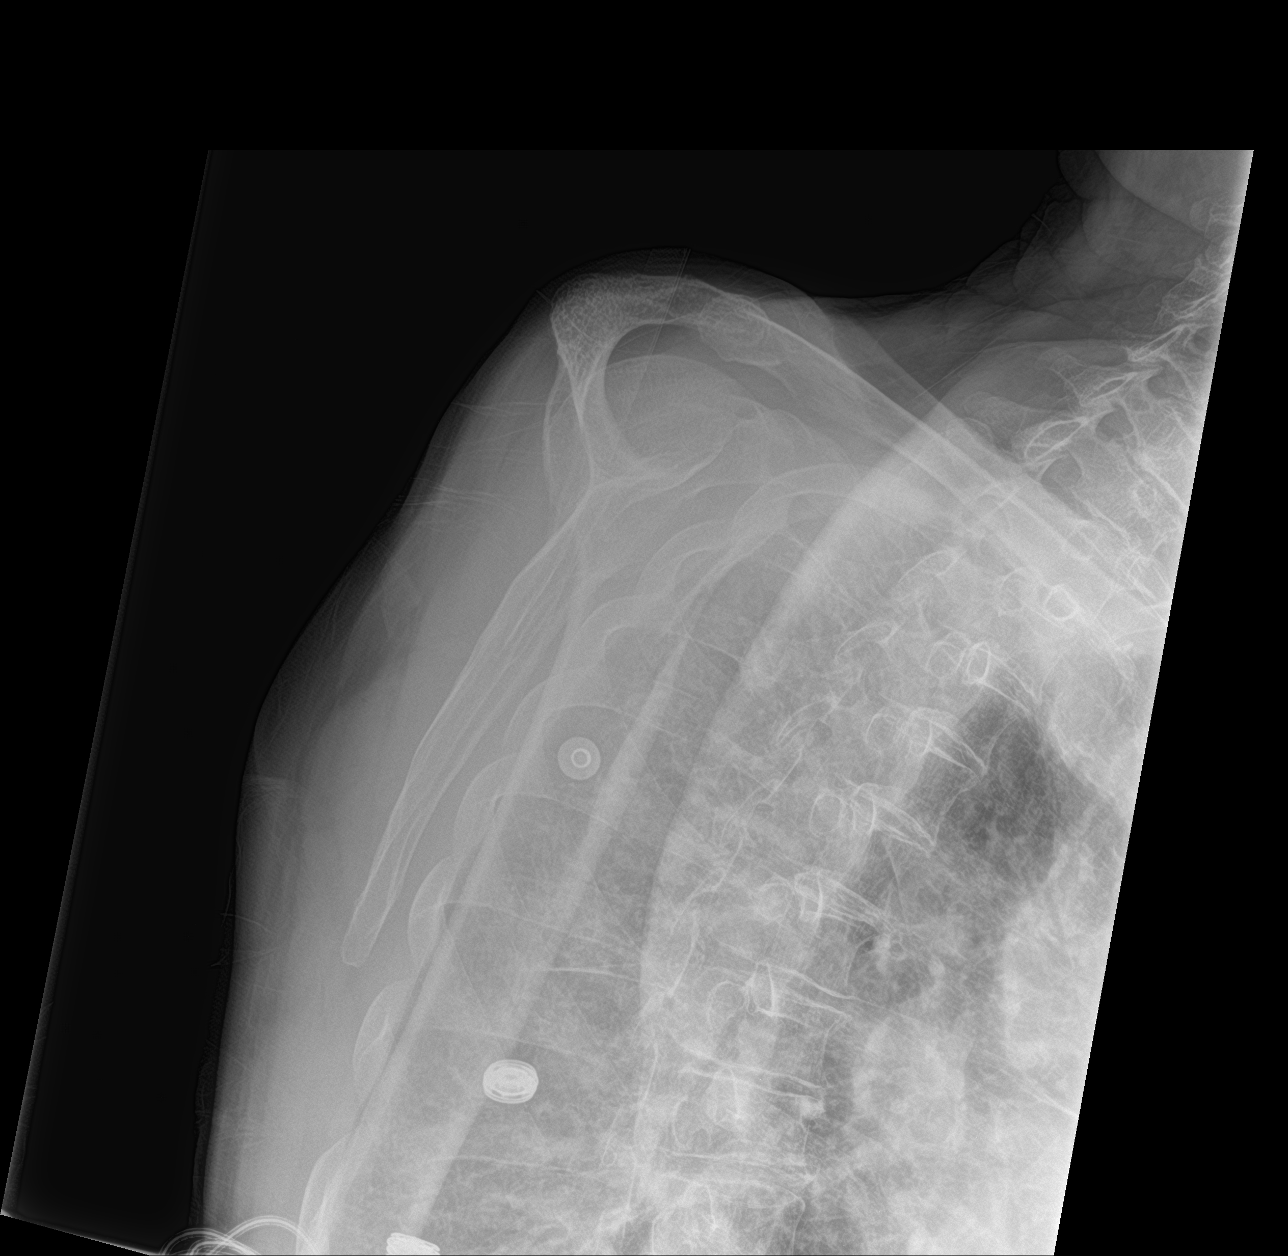

[shoulder ap neutral]
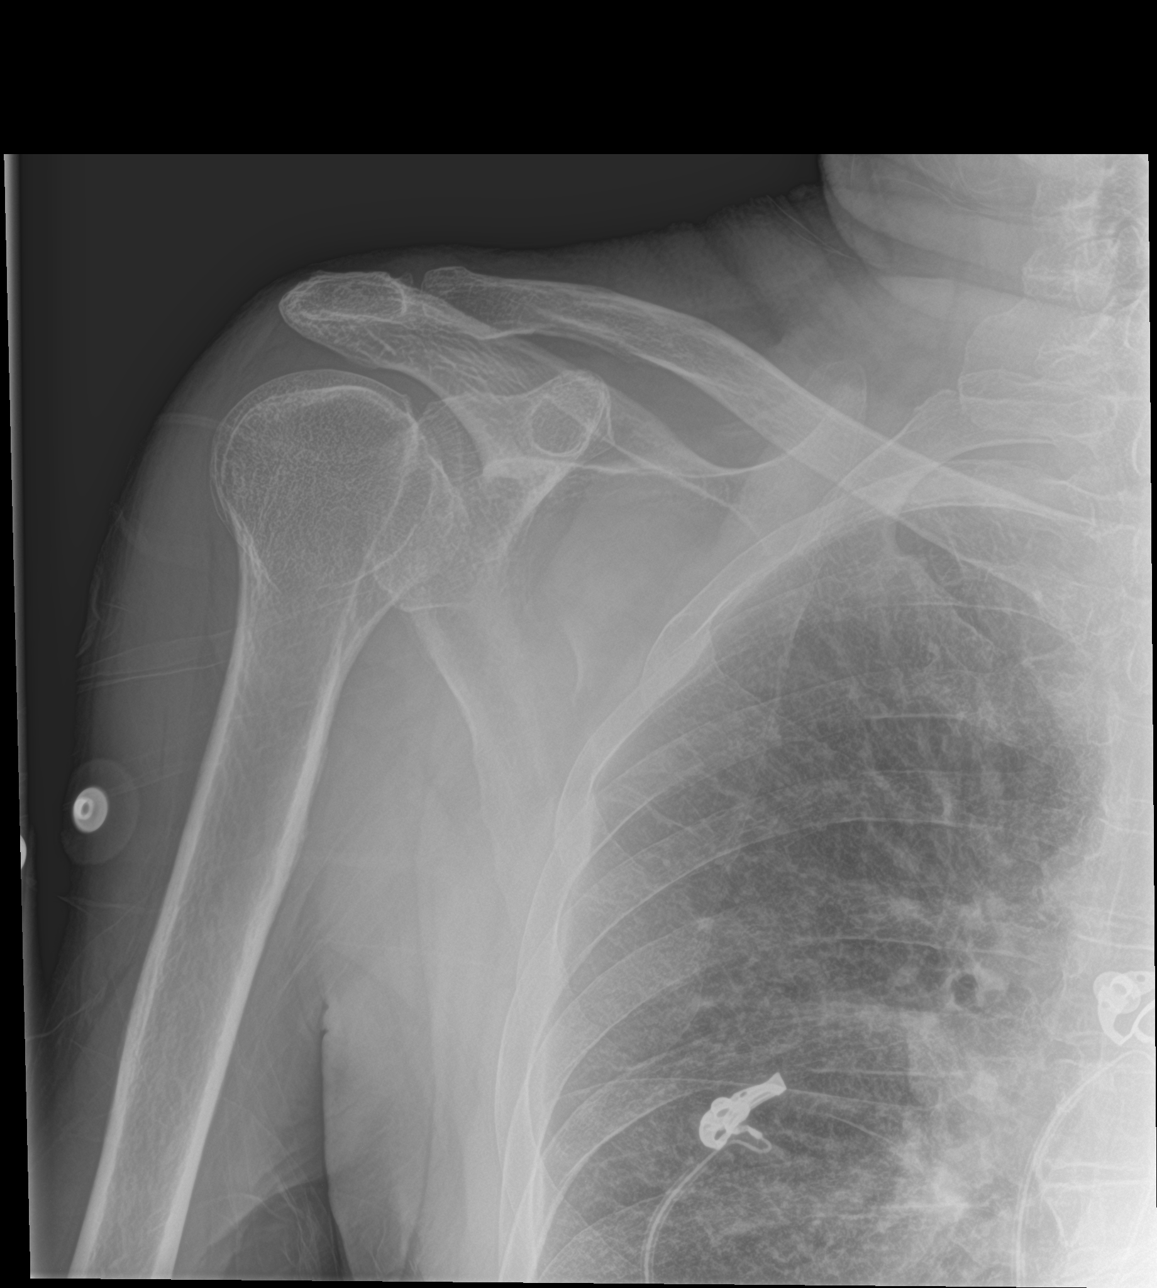

[3 of 3 positions shown; findings below may reference images not displayed]

FINDINGS: There is no evidence of fracture. There is mild glenohumeral and AC
joint degenerative change.
IMPRESSION: No evidence of acute fracture. Mild glenohumeral and AC joint
degenerative change.

## 2020-12-09 IMAGING — DX DG SHOULDER 2+V*L*
3 series · 3 of 3 positions shown · non-contrast
Comparison: None.

CLINICAL DATA: Fall several days ago.  Shoulder pain

EXAM:
LEFT SHOULDER - 2+ VIEW

[shoulder grashey]
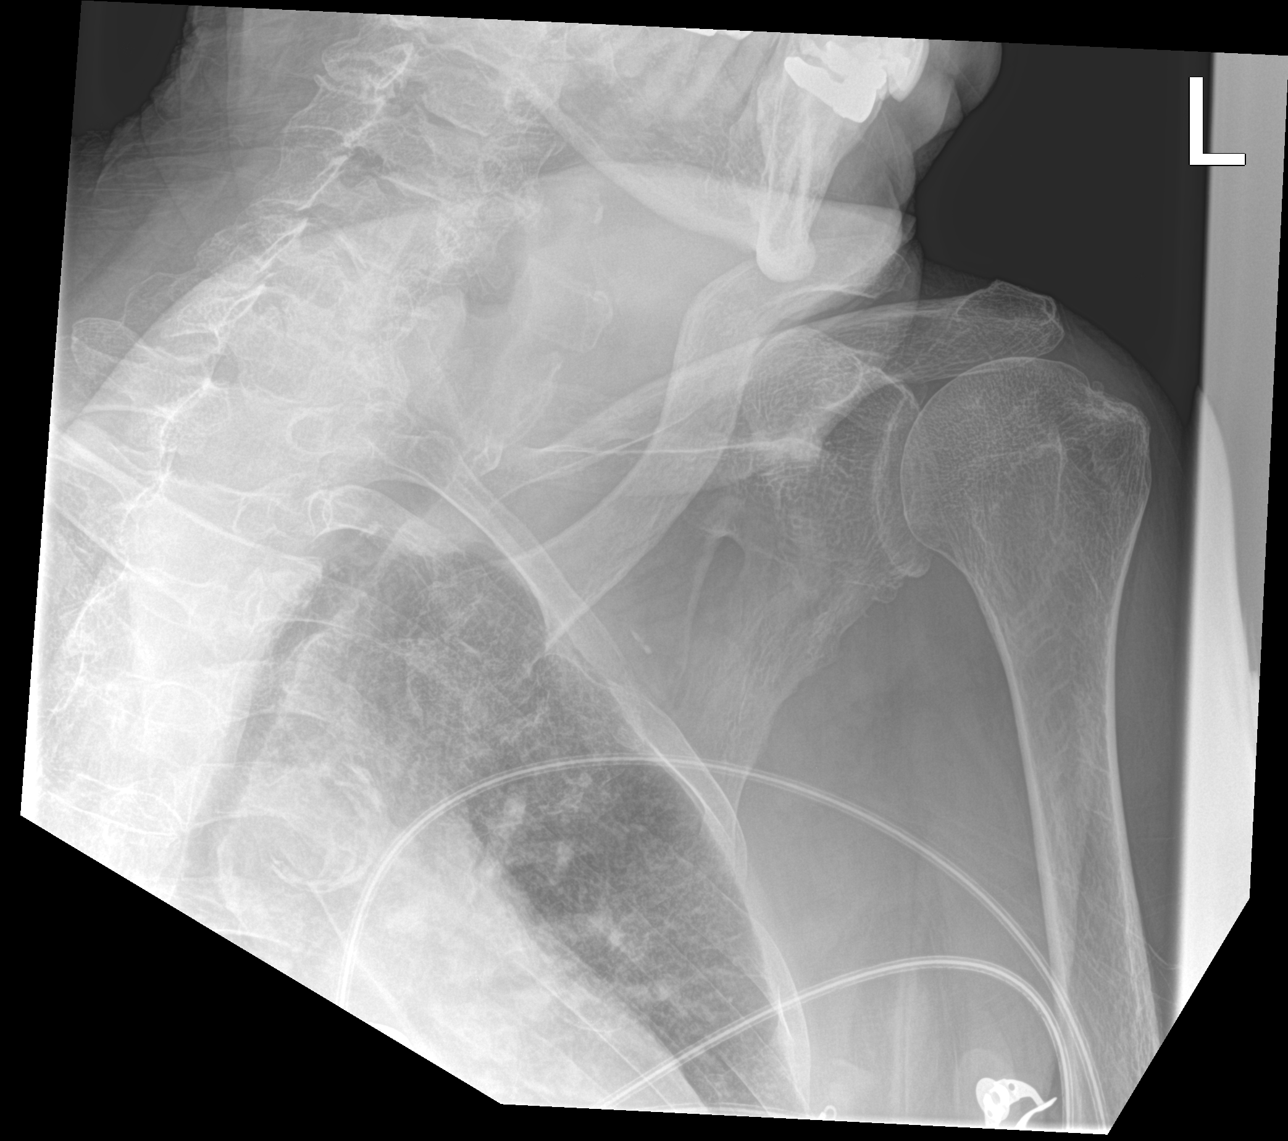

[shoulder y view]
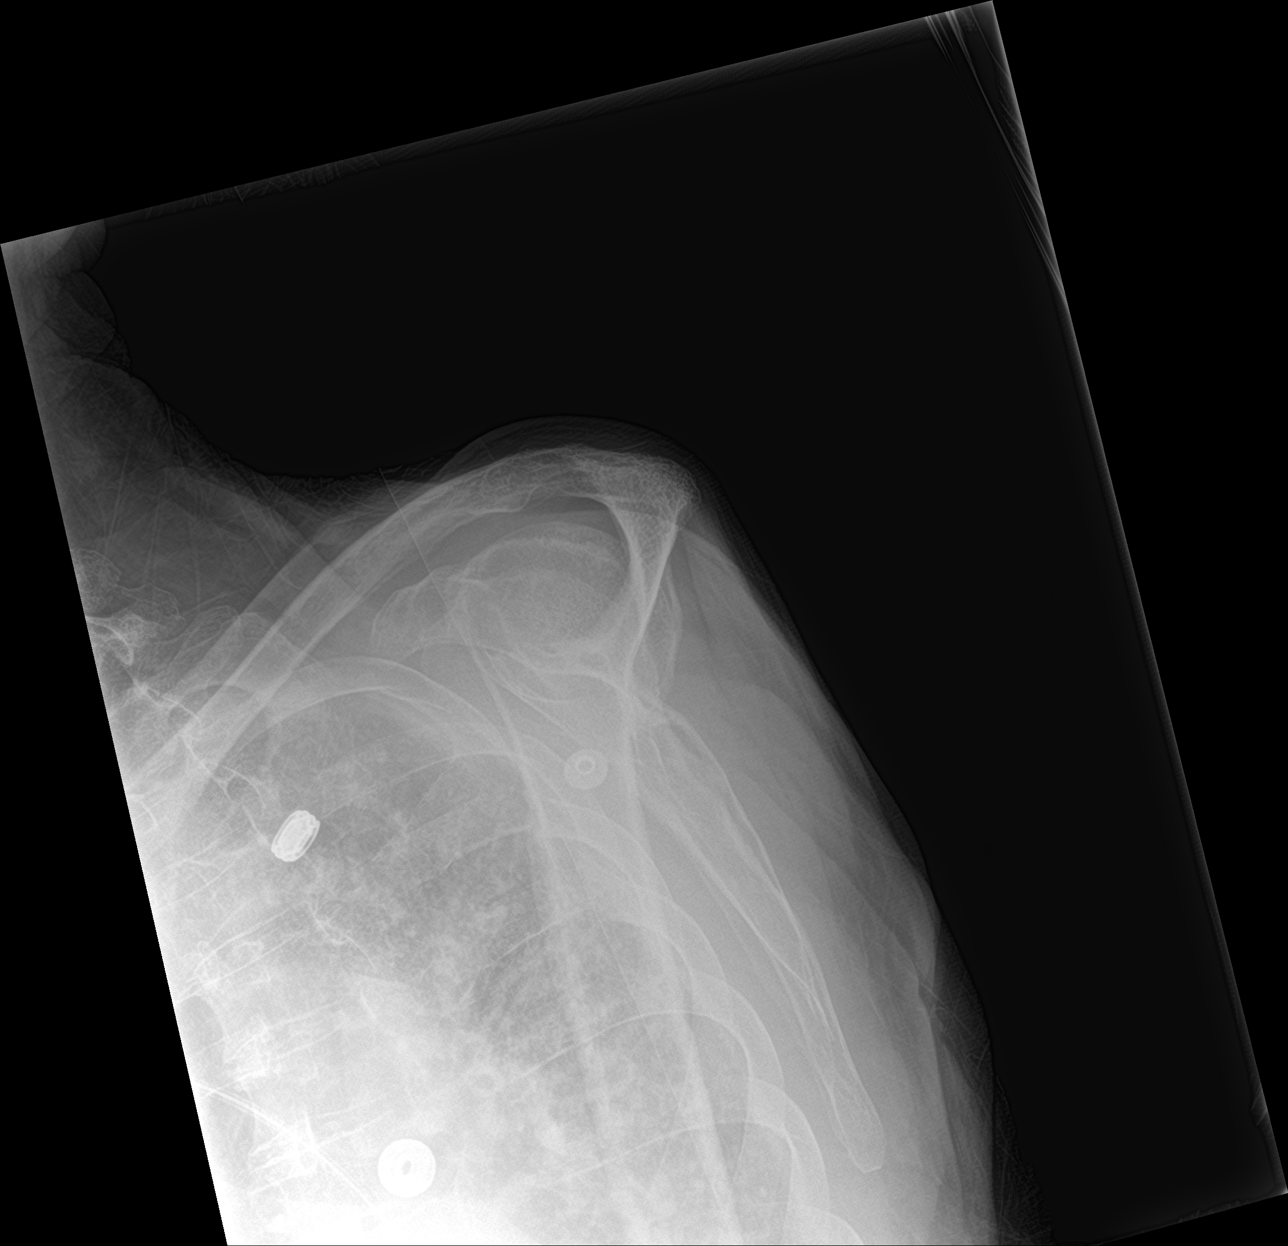

[shoulder ap neutral]
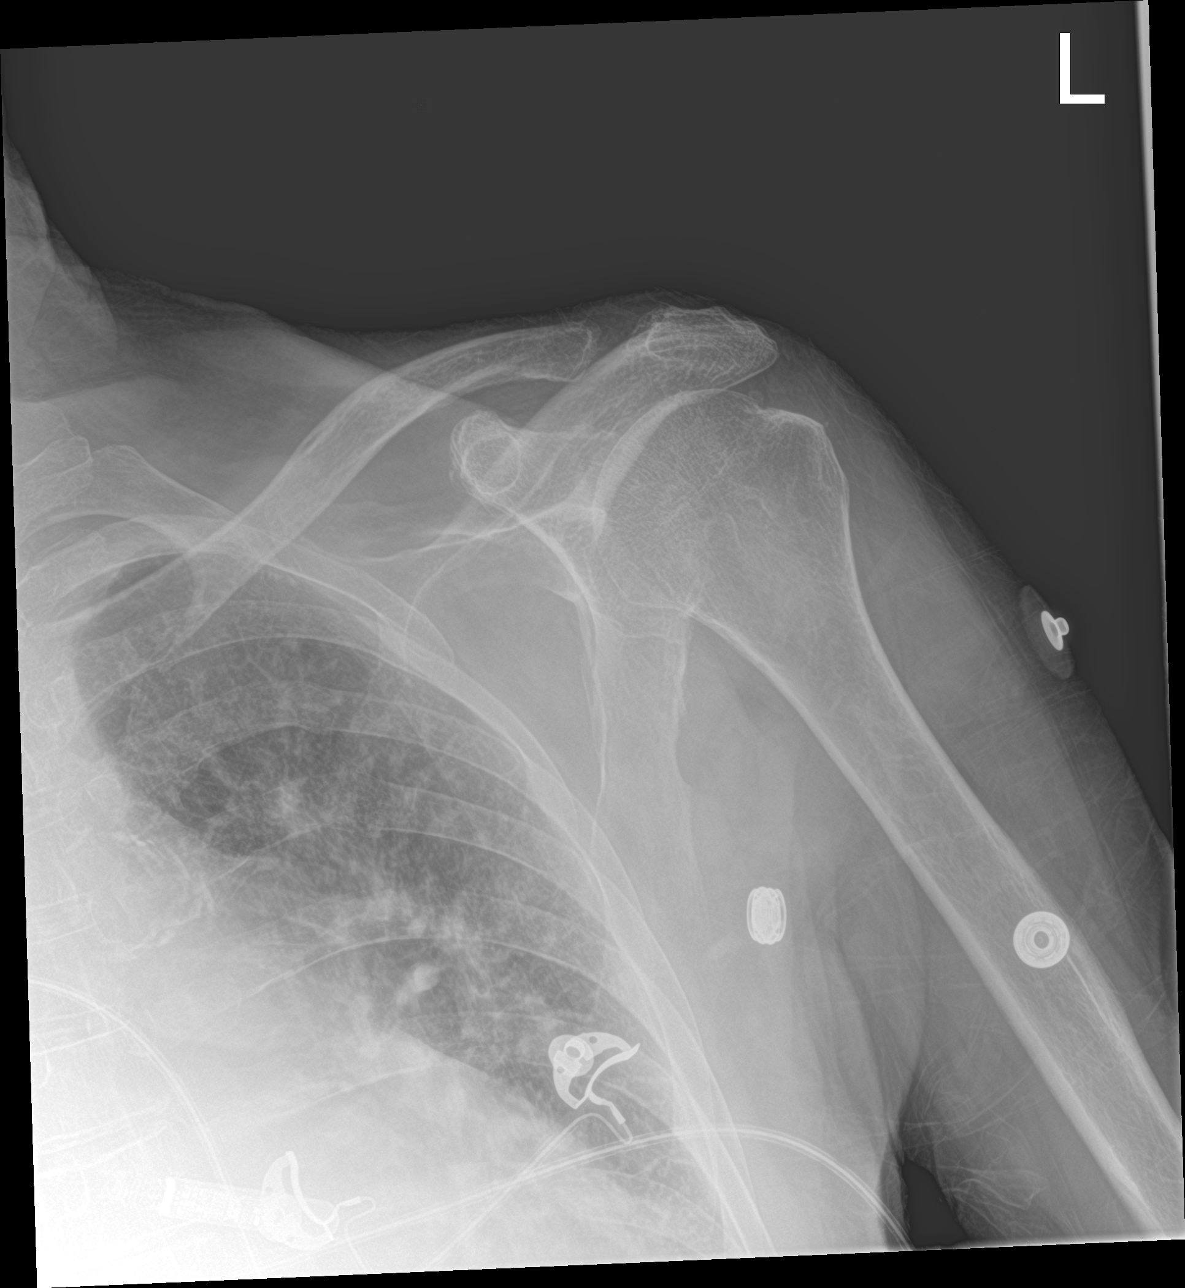

[3 of 3 positions shown; findings below may reference images not displayed]

FINDINGS: Mild widening of the AC joint. No change from prior chest x-ray
[DATE]

Negative for fracture.  Shoulder joint normal.
IMPRESSION: Mild AC separation, chronic.  Negative for fracture

## 2020-12-09 IMAGING — DX DG CHEST 1V PORT
1 series · 1 of 1 positions shown · non-contrast
Comparison: [DATE]

CLINICAL DATA: Possible sepsis.

EXAM:
PORTABLE CHEST 1 VIEW

[chest ap]
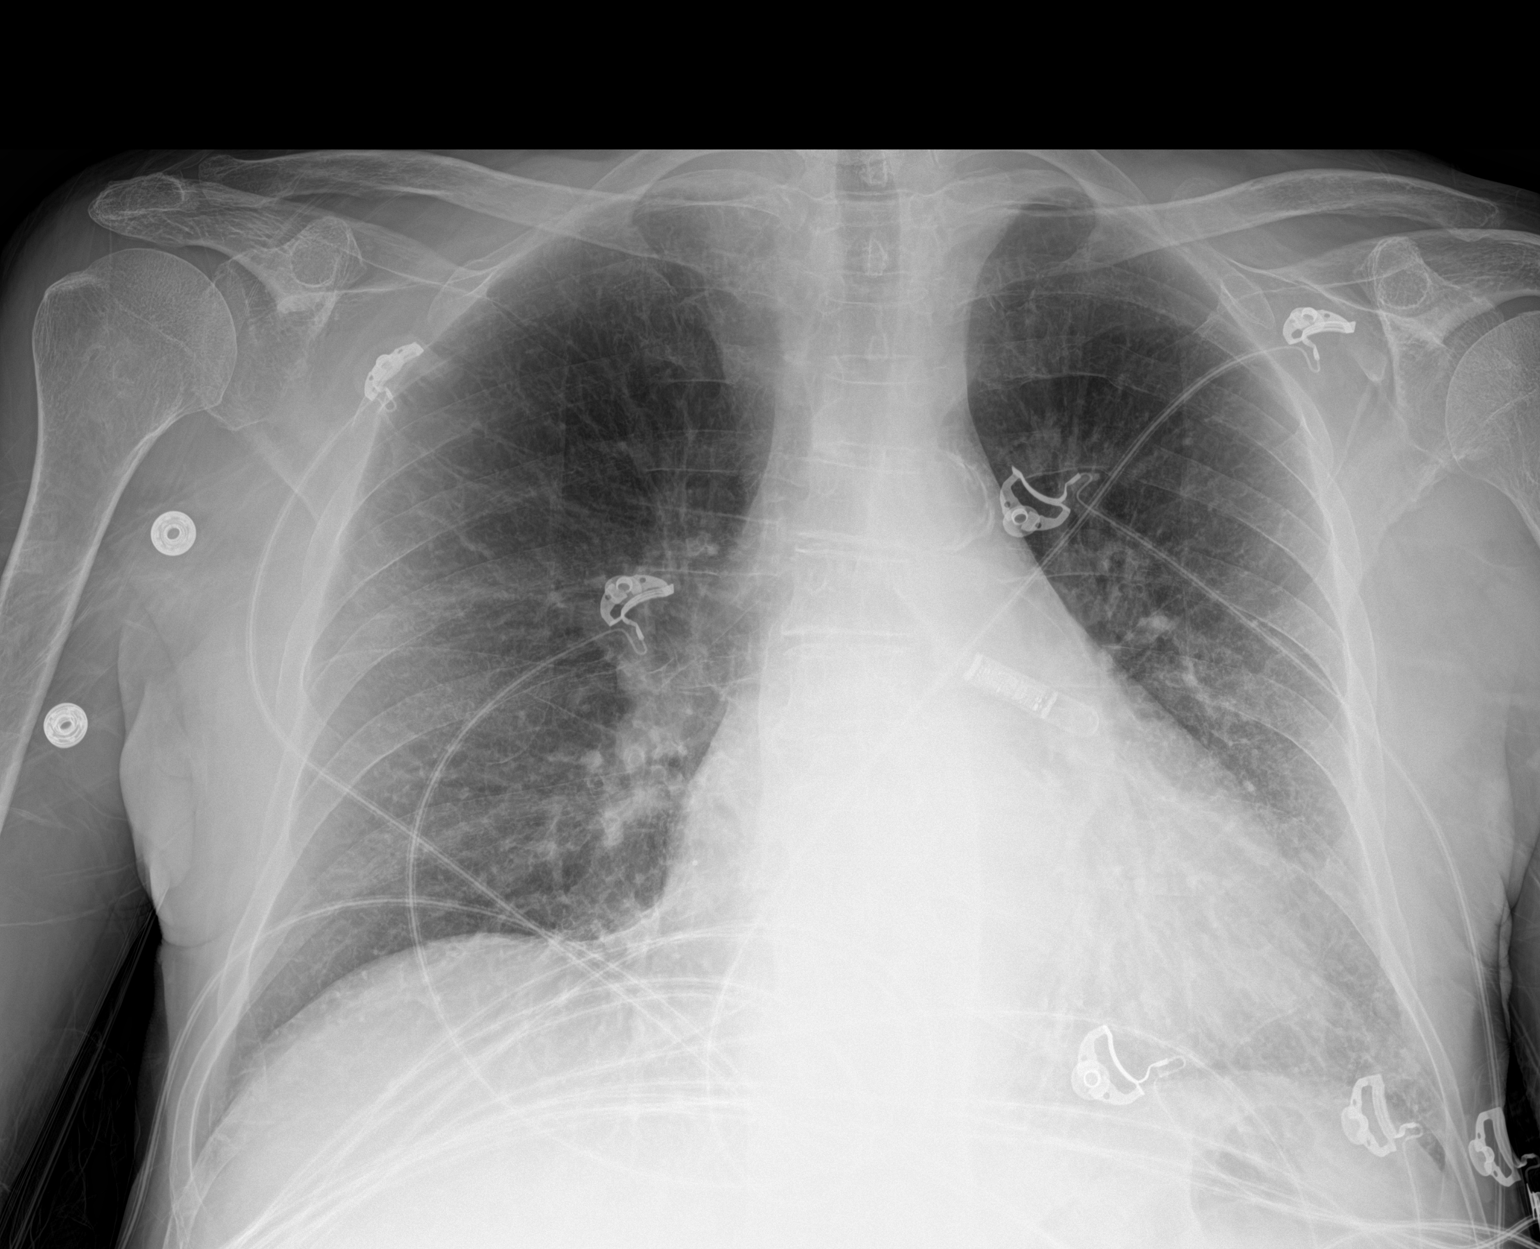

[1 of 1 positions shown; findings below may reference images not displayed]

FINDINGS: Cardiac enlargement and aortic atherosclerosis. Unchanged. Loop
recorder is noted along the projection of the left heart border.
Chronic coarsened interstitial markings are noted throughout both
lungs. No signs of pleural effusion. No airspace consolidation
identified.
IMPRESSION: 1. No acute cardiopulmonary abnormalities
2. Cardiac enlargement

Aortic Atherosclerosis ([8H]-[8H]).

## 2020-12-09 MED ORDER — FOLIC ACID 1 MG PO TABS
1.0000 mg | ORAL_TABLET | Freq: Every day | ORAL | Status: DC
  Administered 2020-12-09 – 2020-12-12 (×4): 1 mg via ORAL
  Filled 2020-12-09 (×4): qty 1

## 2020-12-09 MED ORDER — BUDESONIDE 3 MG PO CPEP
9.0000 mg | ORAL_CAPSULE | Freq: Every day | ORAL | Status: DC
  Administered 2020-12-09 – 2020-12-12 (×4): 9 mg via ORAL
  Filled 2020-12-09 (×5): qty 3

## 2020-12-09 MED ORDER — LACTATED RINGERS IV SOLN: INTRAVENOUS | Status: AC

## 2020-12-09 MED ORDER — SODIUM CHLORIDE 0.9 % IV SOLN: 2.0000 g | Freq: Two times a day (BID) | INTRAVENOUS | Status: DC

## 2020-12-09 MED ORDER — LACTATED RINGERS IV BOLUS (SEPSIS)
1000.0000 mL | Freq: Once | INTRAVENOUS | Status: AC
  Administered 2020-12-09: 1000 mL via INTRAVENOUS

## 2020-12-09 MED ORDER — METOPROLOL TARTRATE 50 MG PO TABS
50.0000 mg | ORAL_TABLET | Freq: Two times a day (BID) | ORAL | Status: DC
  Administered 2020-12-09 – 2020-12-12 (×6): 50 mg via ORAL
  Filled 2020-12-09 (×6): qty 1

## 2020-12-09 MED ORDER — INSULIN GLARGINE 100 UNIT/ML ~~LOC~~ SOLN
10.0000 [IU] | Freq: Every day | SUBCUTANEOUS | Status: DC
  Administered 2020-12-09 – 2020-12-11 (×3): 10 [IU] via SUBCUTANEOUS
  Filled 2020-12-09 (×4): qty 0.1

## 2020-12-09 MED ORDER — TRAMADOL HCL 50 MG PO TABS
50.0000 mg | ORAL_TABLET | Freq: Every evening | ORAL | Status: DC | PRN
  Administered 2020-12-09 – 2020-12-10 (×2): 50 mg via ORAL
  Filled 2020-12-09 (×2): qty 1

## 2020-12-09 MED ORDER — SODIUM CHLORIDE 0.9% IV SOLUTION
250.0000 mL | Freq: Once | INTRAVENOUS | Status: AC
Start: 2020-12-09 — End: 2020-12-09
  Administered 2020-12-09: 250 mL via INTRAVENOUS

## 2020-12-09 MED ORDER — INSULIN ASPART 100 UNIT/ML IJ SOLN
0.0000 [IU] | Freq: Three times a day (TID) | INTRAMUSCULAR | Status: DC
  Administered 2020-12-10: 3 [IU] via SUBCUTANEOUS

## 2020-12-09 MED ORDER — ACETAMINOPHEN 650 MG RE SUPP: 650.0000 mg | Freq: Four times a day (QID) | RECTAL | Status: DC | PRN

## 2020-12-09 MED ORDER — ACETAMINOPHEN 325 MG PO TABS
650.0000 mg | ORAL_TABLET | Freq: Four times a day (QID) | ORAL | Status: DC | PRN
  Administered 2020-12-09: 650 mg via ORAL
  Filled 2020-12-09: qty 2

## 2020-12-09 MED ORDER — SODIUM CHLORIDE 0.9% FLUSH
3.0000 mL | Freq: Two times a day (BID) | INTRAVENOUS | Status: DC
  Administered 2020-12-10 – 2020-12-11 (×3): 3 mL via INTRAVENOUS

## 2020-12-09 MED ORDER — SODIUM CHLORIDE 0.9 % IV SOLN
2.0000 g | Freq: Two times a day (BID) | INTRAVENOUS | Status: DC
  Administered 2020-12-09: 2 g via INTRAVENOUS
  Filled 2020-12-09: qty 2

## 2020-12-09 MED ORDER — APIXABAN 5 MG PO TABS
5.0000 mg | ORAL_TABLET | Freq: Two times a day (BID) | ORAL | Status: DC
  Administered 2020-12-09 – 2020-12-12 (×6): 5 mg via ORAL
  Filled 2020-12-09 (×6): qty 1

## 2020-12-09 MED ORDER — VITAMIN B-12 1000 MCG PO TABS
5000.0000 ug | ORAL_TABLET | Freq: Every day | ORAL | Status: DC
  Administered 2020-12-10 – 2020-12-12 (×3): 5000 ug via ORAL
  Filled 2020-12-09 (×3): qty 5

## 2020-12-09 MED ORDER — AMIODARONE HCL 200 MG PO TABS
200.0000 mg | ORAL_TABLET | Freq: Every day | ORAL | Status: DC
  Administered 2020-12-09 – 2020-12-12 (×4): 200 mg via ORAL
  Filled 2020-12-09 (×4): qty 1

## 2020-12-09 MED ORDER — SODIUM CHLORIDE 0.9 % IV SOLN
2.0000 g | Freq: Once | INTRAVENOUS | Status: AC
  Administered 2020-12-09: 2 g via INTRAVENOUS
  Filled 2020-12-09: qty 2

## 2020-12-09 MED ORDER — ALBUTEROL SULFATE (2.5 MG/3ML) 0.083% IN NEBU: 2.5000 mg | INHALATION_SOLUTION | Freq: Four times a day (QID) | RESPIRATORY_TRACT | Status: DC | PRN

## 2020-12-09 MED ORDER — SODIUM CHLORIDE 0.9 % IV SOLN: 10.0000 mL/h | Freq: Once | INTRAVENOUS | Status: DC

## 2020-12-09 NOTE — ED Provider Notes (Signed)
[facility] EMERGENCY DEPARTMENT Provider Note   CSN: [id number] Arrival date & time: 12/09/20  0938     History Chief Complaint  Patient presents with  . Fever    Tommy Peterson is a 80 y.o. male with a history of chronic large granular lymphocytic leukemia, squamous cell carcinoma, T2DM, anemia, hypertension, hyperlipidemia, prior C. difficile, prior GI bleed, atrial fibrillation on Eliquis, and anxiety who presents to the emergency department via EMS for evaluation of fever. Patient states he has had fever in the "300s" for the past 3 days with urinary frequency. States he has diarrhea which he has had similarly for past few months. No alleviating/aggravating factors to his sxs. Denies cough, dyspnea, chest pain, abdominal pain, dysuria, or melena.   10:36AM: I called & spoke with patient's daughter Nicholaus Bloom for additional hx- she relays patient has had fevers from 101-103 at home for the past 1 week. He had recent admission in April for UTI, did not complete his abx as he was suppose to therefore they contacted PCP who prescribed 7 day course of amoxicillin which patient completed last night without improvement. They have noted urinary frequency/urgency and that he is altered in the sense that he cannot have a serious conversation which is atypical for him. He has had a few falls with head injury, no LOC. On eliquis. They gave 1000 mg of tylenol @ 8:20AM for fever.   HPI     Past Medical History:  Diagnosis Date  . Acute respiratory failure ([place])   . BCC (basal cell carcinoma of skin) 08/15/2016   w/SCC Left Forehead (treatment @ [facility])  . BPH (benign prostatic hyperplasia)   . Carpal tunnel syndrome on both sides   . Chronic diarrhea    intermittant due to chronic collagenous colitis  . Chronic large granular lymphocytic leukemia ([place]) primary hemotologist-  [name]. 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 Collagenous colitis    chronic--- intermittant between diarrahea and constipation  . ED (erectile dysfunction)   . HCAP (healthcare-associated pneumonia)   . History of adenomatous polyp of colon   . History of Clostridium difficile colitis 05/2016  . History of pneumonia 11/08/2017   CAP -- [facility]---  12-19-2017  per pt no residual symptoms  . History of SCC (squamous cell carcinoma) of skin   . History of sepsis    11-20-2017 severe sepsis due to UTI;  10/ 2017sepsis due to c-diff colitis  . Leucocytosis    chronic  . Lower urinary tract symptoms (LUTS)   . Macrocytic anemia    since 02/ 2015  . MGUS (monoclonal gammopathy of unknown significance)    hemotologist-  [name] @ [facility]  . Monoclonal paraproteinemia   . Neuropathy, peripheral   . Nodular basal cell carcinoma (BCC) 10/01/2018   Left Neck(Nodular) Pt Does Not Want Treatment  . Nodular basal cell carcinoma (BCC) 10/01/2018   Mid Forehead (treament curet and excision)  . OA (osteoarthritis)   . Prostatic stone   . Rash of face    RIGHT SIDE  . Raynaud's syndrome   . Recurrent BCC (basal cell carcinoma) 03/05/2019   positive margin  . SCC (squamous cell carcinoma) 08/10/2009   Central Forehead (Moh's Dr. Rhona Raider @ [facility])  . SCC (squamous  cell carcinoma) 12/08/2011   CIS-Left Cheek (treatment Aldara @ [facility])  . SCC (squamous cell carcinoma) 07/09/2015   CIS-Left Forehead (treament @ [facility])  . Septic shock (HCC)    x 3 w/UTI  . Squamous cell carcinoma in situ (SCCIS) 12/08/2011   Left Cheek (treatment Aldara @ [facility])   . Superficial basal cell carcinoma (BCC) 10/01/2018   Left Shin-Pt does not want treatment  . Thin skin    fragile  . Transfusion-dependent anemia since 10/ 2017   last transfusion PRBCs  07-19-2017  per hemologist note dated 10-25-2017  . Type 2 diabetes mellitus treated with insulin ([place])    followed by [name] (pcp)  . Urinary hesitancy   . Wears contact lenses    LEFT EYE ONLY    Patient Active Problem List   Diagnosis Date Noted  . Acute on chronic heart failure with preserved ejection fraction (HFpEF) ([place]) 11/07/2020  . Volume overload 11/06/2020  . Chronic anticoagulation 10/11/2020  . Persistent atrial fibrillation ([place])   . Acute hypoxemic respiratory failure due to COVID-19 ([place]) 04/07/2020  . Pressure injury of skin 09/02/2018  . UTI (urinary tract infection) 09/01/2018  . Sepsis ([place]) 07/28/2018  . CAP (community acquired pneumonia) 07/28/2018  . Paroxysmal atrial fibrillation ([place]) 04/10/2018  . Aortic atherosclerosis ([place]) 04/08/2018  . DM type 2 (diabetes mellitus, type 2) ([place]) 02/19/2018  . Acute respiratory failure with hypoxia ([place]) 02/18/2018  . MGUS (monoclonal gammopathy of unknown significance) 11/23/2017  . BPH (benign prostatic hyperplasia) 11/23/2017  . Left lower lobe pneumonia 11/20/2017  . Nausea vomiting and diarrhea 11/20/2017  . Leukemia not having achieved remission ([place]) 12/26/2016  . Anemia 12/26/2016  . Large granular lymphocytic leukemia ([place]) 12/26/2016  . Diabetes mellitus with complication ([place])   . Macrocytic anemia   . DOE (dyspnea on exertion)   . Anxiety   . History of GI bleed   . C. difficile colitis 05/31/2016  . Lactic acidosis   . History of bone marrow biopsy 05/27/2016  . Fever 05/27/2016  . Encephalopathy in sepsis 05/27/2016  . Chest pain 05/04/2016  . Benign fibroma of prostate 09/20/2015  . HLD (hyperlipidemia) 09/20/2015  . Essential hypertension 09/20/2015  . Arthritis, degenerative 09/20/2015  . Colitis, collagenous 09/09/2015  . Absolute anemia 09/08/2014  . Adenoma of large intestine 07/13/2014  . Disturbance of skin sensation 01/09/2014  . Recurrent major depressive disorder, in full  remission ([place]) 07/18/2012  . Allergic rhinitis 08/29/2011    Past Surgical History:  Procedure Laterality Date  . BONE MARROW BIOPSY Right 05-24-2016;  08-20-2014  . CARDIOVASCULAR STRESS TEST  11-26-2012   [name]  @ [place] ([place]   normal nuclear study w/ no ischemia/  normal LV function and wall motion , ef 60% (find in care everywhere,epic)  . CARDIOVERSION N/A 08/31/2020   Procedure: CARDIOVERSION;  Surgeon: [name], MD;  Location: [facility];  Service: Cardiovascular;  Laterality: N/A;  . CATARACT EXTRACTION W/ INTRAOCULAR LENS  IMPLANT, BILATERAL  2015  . CYSTOSCOPY WITH LITHOLAPAXY N/A 12/25/2017   Procedure: CYSTOSCOPY WITH LITHOLAPAXY AND FULGERATION;  Surgeon: [name], MD;  Location: [facility];  Service: Urology;  Laterality: N/A;  . INGUINAL HERNIA REPAIR Right 1980s  . LOOP RECORDER INSERTION N/A 04/10/2018   Procedure: LOOP RECORDER INSERTION;  Surgeon: [name], MD;  Location: [facility];  Service: Cardiovascular;  Laterality: N/A;  . PENILE PROSTHESIS IMPLANT  12-02-2015   dr  jon hudson @ [facility] in [place]  . SHOULDER SURGERY Left 08/2016  . TEE WITHOUT CARDIOVERSION N/A 08/31/2020   Procedure: TRANSESOPHAGEAL ECHOCARDIOGRAM (TEE);  Surgeon: [name], MD;  Location: [facility];  Service: Cardiovascular;  Laterality: N/A;  . TONSILLECTOMY  child  . TRANSURETHRAL RESECTION OF PROSTATE  2009   AND REPAIR RECURRENT RIGHT INGUINAL HERNIA       Family History  Problem Relation Age of Onset  . Diabetes Mother   . Heart failure Mother     Social History   Tobacco Use  . Smoking status: Former Smoker    Years: 20.00    Types: Cigarettes    Quit date: 05/04/1996    Years since quitting: 24.6  . Smokeless tobacco: Never Used  Vaping Use  . Vaping Use: Never used  Substance Use Topics  . Alcohol use: Yes    Comment: 1-2 glasses of wine every other day  . Drug use: No    Home  Medications Prior to Admission medications   Medication Sig Start Date End Date Taking? Authorizing Provider  amiodarone (PACERONE) 200 MG tablet Take 1 tablet (200 mg total) by mouth daily. 09/29/20   [facility], Tami Lin, PA  apixaban (ELIQUIS) 5 MG TABS tablet Take 1 tablet (5 mg total) by mouth 2 (two) times daily. 08/18/20   Croitoru, Mihai, MD  b complex vitamins capsule Take 1 capsule by mouth every evening.    [provider]  BD PEN NEEDLE NANO 2ND GEN 32G X 4 MM MISC  07/22/20   [provider]  budesonide (ENTOCORT EC) 3 MG 24 hr capsule Take 9 mg by mouth daily.    [provider]  Cranberry 500 MG TABS Take 500 mg by mouth daily.    [provider]  Cyanocobalamin (VITAMIN B-12) 5000 MCG SUBL Place 5,000 mcg under the tongue daily.    [provider]  D-Mannose 500 MG CAPS Take 500 mg by mouth in the morning and at bedtime.    [provider]  folic acid (FOLVITE) 1 MG tablet TAKE 1 TABLET BY MOUTH EVERY DAY Patient taking differently: Take 1 mg by mouth daily. 07/23/20   Brunetta Genera, MD  furosemide (LASIX) 20 MG tablet Take 20 mg by mouth daily.    [provider]  Insulin Glargine-Lixisenatide 100-33 UNT-MCG/ML SOPN Inject 26 Units into the skin at bedtime. Soliqua 100/33    [provider]  methotrexate (RHEUMATREX) 2.5 MG tablet Take 10 mg by mouth once a week. Caution:Chemotherapy. Protect from light.    [provider]  metoprolol tartrate (LOPRESSOR) 50 MG tablet Take 50 mg by mouth 2 (two) times daily.    [provider]  NOVOLOG FLEXPEN 100 UNIT/ML FlexPen Inject 6 Units into the skin 3 (three) times daily with meals. 02/21/19   [provider]  oxybutynin (DITROPAN) 5 MG tablet Take 5 mg by mouth daily as needed (Too slow down urine).    [provider]  pioglitazone (ACTOS) 15 MG tablet Take 15 mg by mouth every morning.  10/18/17   [provider]   traMADol (ULTRAM) 50 MG tablet Take 50 mg by mouth at bedtime as needed (restlessness).    [provider]    Allergies    Bee venom, Metformin and related, and Sulfamethoxazole-trimethoprim  Review of Systems   Review of Systems  Constitutional: Positive for fever.  Respiratory: Negative for cough and shortness of breath.   Cardiovascular:  Negative for chest pain.  Gastrointestinal: Positive for diarrhea. Negative for abdominal pain, blood in stool and vomiting.  Genitourinary: Positive for frequency and urgency. Negative for dysuria.  All other systems reviewed and are negative.   Physical Exam Updated Vital Signs BP 114/67   Pulse 74   Temp (!) 101 F (38.3 C) (Rectal)   Resp (!) 21   Ht 5\' 9"  (1.753 m)   Wt 74 kg   SpO2 97%   BMI 24.09 kg/m   Physical Exam Vitals and nursing note reviewed.  Constitutional:      Appearance: He is not toxic-appearing.  HENT:     Head: Normocephalic and atraumatic.     Comments: No raccoon eyes or battle sign. No palpable facial bony tenderness    Nose: Nose normal.  Eyes:     Pupils: Pupils are equal, round, and reactive to light.  Cardiovascular:     Rate and Rhythm: Normal rate and regular rhythm.  Pulmonary:     Effort: Pulmonary effort is normal. No respiratory distress.     Breath sounds: Normal breath sounds. No wheezing.  Abdominal:     General: There is no distension.     Palpations: Abdomen is soft.     Tenderness: There is no abdominal tenderness. There is no right CVA tenderness, left CVA tenderness, guarding or rebound.  Genitourinary:    Comments: Chaperone present. Penile prosthesis pump in place.  Patient has some skin breakdown perianally.  DRE with soft brown stool.  No melena or hematochezia.  Fecal occult negative. Musculoskeletal:     Cervical back: Normal range of motion and neck supple. No rigidity or tenderness.     Comments: Upper extremities: Patient is able to fully actively flex the  shoulders, flex/extend the elbows, and flex/extend the wrists. Able to move all digits. Mild tenderness to the shoulders bilaterally otherwise nontender.  Back: No midline tenderness.  Lower extremities: Patient able to lift his bilateral lower extremities off of the stretcher, fully flex/extend the knees, and plantar/dorsiflex the ankles. No focal bony tenderness.   Neurological:     Mental Status: He is alert.     Comments: Alert. Clear speech. Oriented to person, place, and month, disoriented to year- states it is 2004. Sensation grossly intact x4. 5/5 symmetric grip strength & strength with plantar/dorsiflexion bilaterally.      ED Results / Procedures / Treatments   Labs (all labs ordered are listed, but only abnormal results are displayed) Labs Reviewed  PROTIME-INR - Abnormal; Notable for the following components:      Result Value   Prothrombin Time 19.3 (*)    INR 1.6 (*)    All other components within normal limits  CBC WITH DIFFERENTIAL/PLATELET - Abnormal; Notable for the following components:   WBC 13.9 (*)    RBC 1.77 (*)    Hemoglobin 6.9 (*)    HCT 20.0 (*)    MCV 113.0 (*)    MCH 39.0 (*)    Neutro Abs 8.7 (*)    Monocytes Absolute 1.3 (*)    Abs Immature Granulocytes 0.28 (*)    All other components within normal limits  COMPREHENSIVE METABOLIC PANEL - Abnormal; Notable for the following components:   Glucose, Bld 133 (*)    BUN 5 (*)    Creatinine, Ser 0.55 (*)    Calcium 7.7 (*)    Total Protein 5.1 (*)    Albumin 2.6 (*)    All other components within normal limits  RESP PANEL BY RT-PCR (FLU A&B, COVID) ARPGX2  CULTURE, BLOOD (ROUTINE X 2)  CULTURE, BLOOD (ROUTINE X 2)  URINE CULTURE  C DIFFICILE QUICK SCREEN W PCR REFLEX  LACTIC ACID, PLASMA  APTT  LACTIC ACID, PLASMA  CBC WITH DIFFERENTIAL/PLATELET  URINALYSIS, ROUTINE W REFLEX MICROSCOPIC  CBC WITH DIFFERENTIAL/PLATELET  POC OCCULT BLOOD, ED  TYPE AND SCREEN  PREPARE RBC (CROSSMATCH)     EKG None  Radiology CT Head Wo Contrast  Result Date: 12/09/2020 CLINICAL DATA:  Altered mental status. Unspecified minor head trauma. EXAM: CT HEAD WITHOUT CONTRAST TECHNIQUE: Contiguous axial images were obtained from the base of the skull through the vertex without intravenous contrast. COMPARISON:  11/29/2020 FINDINGS: Brain: No significant change in mild-to-moderate enlargement of the ventricles and subarachnoid spaces. Stable mild patchy white matter low density in both cerebral hemispheres. No intracranial hemorrhage, mass lesion or CT evidence of acute infarction. Vascular: No hyperdense vessel or unexpected calcification. Skull: Normal. Negative for fracture or focal lesion. Sinuses/Orbits: Status post bilateral cataract extraction. Mild bilateral frontal, ethmoid and maxillary sinus mucosal thickening. Other: None. IMPRESSION: 1. No acute abnormality. 2. Mild-to-moderate atrophy and mild chronic small vessel white matter ischemic changes in both cerebral hemispheres. Electronically Signed   By: Claudie Revering M.D.   On: 12/09/2020 12:27   DG Chest Port 1 View  Result Date: 12/09/2020 CLINICAL DATA:  Possible sepsis. EXAM: PORTABLE CHEST 1 VIEW COMPARISON:  11/06/2020 FINDINGS: Cardiac enlargement and aortic atherosclerosis. Unchanged. Loop recorder is noted along the projection of the left heart border. Chronic coarsened interstitial markings are noted throughout both lungs. No signs of pleural effusion. No airspace consolidation identified. IMPRESSION: 1. No acute cardiopulmonary abnormalities 2. Cardiac enlargement Aortic Atherosclerosis (ICD10-I70.0). Electronically Signed   By: Kerby Moors M.D.   On: 12/09/2020 10:40    Procedures .Critical Care Performed by: Amaryllis Dyke, PA-C Authorized by: Amaryllis Dyke, PA-C     CRITICAL CARE Performed by: Kennith Maes   Total critical care time: 45 minutes  Critical care time was exclusive of separately  billable procedures and treating other patients.  Critical care was necessary to treat or prevent imminent or life-threatening deterioration.  Critical care was time spent personally by me on the following activities: development of treatment plan with patient and/or surrogate as well as nursing, discussions with consultants, evaluation of patient's response to treatment, examination of patient, obtaining history from patient or surrogate, ordering and performing treatments and interventions, ordering and review of laboratory studies, ordering and review of radiographic studies, pulse oximetry and re-evaluation of patient's condition.   Medications Ordered in ED Medications  lactated ringers infusion ( Intravenous New Bag/Given 12/09/20 1008)  0.9 %  sodium chloride infusion (has no administration in time range)  ceFEPIme (MAXIPIME) 2 g in sodium chloride 0.9 % 100 mL IVPB (has no administration in time range)  lactated ringers bolus 1,000 mL (0 mLs Intravenous Stopped 12/09/20 1019)  ceFEPIme (MAXIPIME) 2 g in sodium chloride 0.9 % 100 mL IVPB (0 g Intravenous Stopped 12/09/20 1114)    ED Course  I have reviewed the triage vital signs and the nursing notes.  Pertinent labs & imaging results that were available during my care of the patient were reviewed by me and considered in my medical decision making (see chart for details).    MDM Rules/Calculators/A&P                          Patient  presents to the ED for evaluation of fever x 7 days.  Febrile to 101 on arrival, vitals otherwise without significant abnormality. Received 1 g of tylenol @ 08:30. Exam without obvious signs of trauma, is anticoagulated on eliquis with reported head injury therefore will obtain head CT and bilateral shoulder x-rays as patient has complaints of pain on these locations on exam. Patient initiated on sepsis protocol, suspicion is for urinary source- will start cefepime. 1L LR ordered with lactic acid pending.    Additional history obtained:  Additional history obtained from chart review & nursing note review.  Urine culture from 11/06/2020 grew out E. coli, sensitive to cephalosporins. Patient admitted to the hospital 04/02 through 11/08/2020- admission reviewed.  Last echocardiogram during recent admission- grade II diastolic dysfunction, preserved EF.   Lab Tests:  I Ordered, reviewed, and interpreted labs, which included:  CBC: Leukocytosis at 13.9.  Hemoglobin of 6.9, acutely worsened anemia since last lab check.  Platelets are clumped therefore will need to repeat CBC. CMP: Hypocalcemia and hypoalbuminemia which are worsened from prior. PT/INR: Similar to prior. APTT: Within normal limits Lactic acid: Within normal limits COVID/Influenza: Negative.   Imaging Studies ordered:  I ordered imaging studies which included chest x-ray as well as CT head without contrast, I independently reviewed, formal radiology impression shows:  CXR:  1. No acute cardiopulmonary abnormalities 2. Cardiac enlargement Aortic Atherosclerosis  CT head: 1. No acute abnormality. 2. Mild-to-moderate atrophy and mild chronic small vessel white matter ischemic changes in both cerebral hemispheres.  ED Course:  Concern for sepsis- suspected secondary to UTI, recent oral abx, currenlty receiving cefepime, 1L LR ordered, BP with MAP > 65 and lactic WNL. Mild leukocytosis present. Post void bladder scan with 55 mL- do not feel patient needs emergent foley catheter at this time.   Anemia- Hx of Transfusion dependent anemia- CLL-received 1 unit during hospitalization recently, will check fecal occult blood testing (negative) and administer 1 unit of PRBCs.   Will discuss w/ hospitliast service for admission- patient is in agreement.   13:45: CONSULT: Discussed with hospitalist Dr. Tamala Julian- accepts admission.   I discussed with patient's wife via telephone- updated on results & plan of care.   Findings and plan of care  discussed with supervising physician Dr. Jeanell Sparrow who has evaluated patient as shared visit & is in agreement.   Portions of this note were generated with [employer]. Dictation errors may occur despite best attempts at proofreading.  Final Clinical Impression(s) / ED Diagnoses Final diagnoses:  Acute UTI  Sepsis, due to unspecified organism, unspecified whether acute organ dysfunction present ([place])  Anemia, unspecified type    Rx / DC Orders ED Discharge Orders    None       Amaryllis Dyke, PA-C 12/09/20 1410    Pattricia Boss, MD 12/09/20 [phone]

## 2020-12-09 NOTE — Progress Notes (Signed)
NEW ADMISSION NOTE  Arrival Method: bed Mental Orientation: oriented to self, poor historian Telemetry: yes Assessment: Completed, MASD Blanchable bilateral buttocks Skin: see notes Iv: right forearm and left forearm Pain: 0 Tubes: 0 Safety Measures: Safety Fall Prevention Plan has been given, discussed and signed Admission: Completed [facility] Orientation: Patient has been orientated to the room, unit and staff.  Family: 0  Orders have been reviewed and implemented. Will continue to monitor the patient. Call light has been placed within reach and bed alarm has been activated.  Patient is a poor historian and unable to answer admitting questions  Beatris Ship, RN

## 2020-12-09 NOTE — Progress Notes (Signed)
Patient had a temperature of 100.5, tylenol Prn was administered, MD was notified and no new orders were placed, RN will continued to monitor this patient.

## 2020-12-09 NOTE — Progress Notes (Signed)
Per RN, AB hung @ 1020 and BC drawn @ 1021

## 2020-12-09 NOTE — ED Notes (Signed)
Dr Tamala Julian at pt bedside

## 2020-12-09 NOTE — H&P (Addendum)
History and Physical    Tommy Peterson SBU:[id number] DOB: 1940/09/14 DOA: 12/09/2020  Referring MD/NP/PA: Sherren Mocha, PA-C PCP: Irena Reichmann, DO  Patient coming from: Home  Chief Complaint: Fever and urinary frequency  I have personally briefly reviewed patient's old medical records in [place] [facility]   HPI: Tommy Peterson is a 80 y.o. male with medical history significant of hypertension, paroxysmal atrial fibrillation on anticoagulation, CLL, chronic diarrhea, and diabetes mellitus type 2 who presents with with complaints of fever and urinary frequency 1 to 2-weeks. He had just recently been hospitalized from 4/2-4/4 due to diastolic congestive heart failure exacerbation with E. coli UTI.  He was discharged on Keflex, but it appears that the patient did not complete the course of this medication.  Patient complains of having fevers up to 103 F at home with urinary frequency using the restroom every 10 to 15 minutes for at least the last week.  Notes associated symptoms of chills, lower abdominal pain, diarrhea, fall, headache, poor p.o. intake, and some confusion. Daughter notes he has been hhe normal self repeating questions and was confused to the date.  He has had 2-3 falls over the last 2-3 weeks.  To her knowledge his last fall was yesterday, but also had reportedly fallen into the shower at some point thought to possibly have passed out from getting up too fast.  Due to his symptoms he had been seen by his primary care provider and just completed   ED Course: Upon admission into the emergency department patient was noted to be febrile up to 101 F, pulse 57-1 15, respirations 11-22, and all other vital signs maintained.  Labs significant for WBC 13.9, hemoglobin 6.9, calcium 7.7, albumin 2.6, and INR 1.6.  Stool guaiacs were noted to be negative.  Patient was typed and screened for the possible need of blood products.  Patient was given 1 L normal saline fluids and started on  cefepime.  Review of Systems  Unable to perform ROS: Mental status change  Constitutional: Positive for chills and fever.  Cardiovascular: Negative for chest pain.  Gastrointestinal: Positive for abdominal pain and diarrhea.       Past Medical History:  Diagnosis Date  . Acute respiratory failure (HCC)   . BCC (basal cell carcinoma of skin) 08/15/2016   w/SCC Left Forehead (treatment @ [facility])  . BPH (benign prostatic hyperplasia)   . Carpal tunnel syndrome on both sides   . Chronic diarrhea    intermittant due to chronic collagenous colitis  . Chronic large granular lymphocytic leukemia (HCC) primary hemotologist-  dr Judie Petit. 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 Collagenous colitis    chronic--- intermittant between diarrahea and constipation  . ED (erectile dysfunction)   . HCAP (healthcare-associated pneumonia)   . History of adenomatous polyp of colon   . History of Clostridium difficile colitis 05/2016  . History of pneumonia 11/08/2017   CAP -- LLL---  12-19-2017  per pt no residual symptoms  . History of SCC (squamous cell carcinoma) of skin   . History of sepsis    11-20-2017 severe sepsis due to UTI;  10/ 2017sepsis due to c-diff colitis  . Leucocytosis    chronic  . Lower urinary tract symptoms (LUTS)   . Macrocytic anemia  since 02/ 2015  . MGUS (monoclonal gammopathy of unknown significance)    hemotologist-  [name] @ [facility]  . Monoclonal paraproteinemia   . Neuropathy, peripheral   . Nodular basal cell carcinoma (BCC) 10/01/2018   Left Neck(Nodular) Pt Does Not Want Treatment  . Nodular basal cell carcinoma (BCC) 10/01/2018   Mid Forehead (treament curet and excision)  . OA (osteoarthritis)   . Prostatic stone   . Rash of  face    RIGHT SIDE  . Raynaud's syndrome   . Recurrent BCC (basal cell carcinoma) 03/05/2019   positive margin  . SCC (squamous cell carcinoma) 08/10/2009   Central Forehead (Moh's Dr. Rhona Raider @ [facility])  . SCC (squamous cell carcinoma) 12/08/2011   CIS-Left Cheek (treatment Aldara @ [facility])  . SCC (squamous cell carcinoma) 07/09/2015   CIS-Left Forehead (treament @ [facility])  . Septic shock (HCC)    x 3 w/UTI  . Squamous cell carcinoma in situ (SCCIS) 12/08/2011   Left Cheek (treatment Aldara @ [facility])   . Superficial basal cell carcinoma (BCC) 10/01/2018   Left Shin-Pt does not want treatment  . Thin skin    fragile  . Transfusion-dependent anemia since 10/ 2017   last transfusion PRBCs 07-19-2017  per hemologist note dated 10-25-2017  . Type 2 diabetes mellitus treated with insulin ([place])    followed by [name] (pcp)  . Urinary hesitancy   . Wears contact lenses    LEFT EYE ONLY    Past Surgical History:  Procedure Laterality Date  . BONE MARROW BIOPSY Right 05-24-2016;  08-20-2014  . CARDIOVASCULAR STRESS TEST  11-26-2012   [name]  @ [place] ([place]   normal nuclear study w/ no ischemia/  normal LV function and wall motion , ef 60% (find in care everywhere,epic)  . CARDIOVERSION N/A 08/31/2020   Procedure: CARDIOVERSION;  Surgeon: [name], MD;  Location: [place];  Service: Cardiovascular;  Laterality: N/A;  . CATARACT EXTRACTION W/ INTRAOCULAR LENS  IMPLANT, BILATERAL  2015  . CYSTOSCOPY WITH LITHOLAPAXY N/A 12/25/2017   Procedure: CYSTOSCOPY WITH LITHOLAPAXY AND FULGERATION;  Surgeon: [name], MD;  Location: [facility];  Service: Urology;  Laterality: N/A;  . INGUINAL HERNIA REPAIR Right 1980s  . LOOP RECORDER INSERTION N/A 04/10/2018   Procedure: LOOP RECORDER INSERTION;  Surgeon: [name], MD;  Location: [place] [facility];  Service: Cardiovascular;  Laterality: N/A;  . PENILE PROSTHESIS IMPLANT   12-02-2015   dr [name] @ [facility] in [place]  . SHOULDER SURGERY Left 08/2016  . TEE WITHOUT CARDIOVERSION N/A 08/31/2020   Procedure: TRANSESOPHAGEAL ECHOCARDIOGRAM (TEE);  Surgeon: [name], MD;  Location: [place];  Service: Cardiovascular;  Laterality: N/A;  . TONSILLECTOMY  child  . TRANSURETHRAL RESECTION OF PROSTATE  2009   AND REPAIR RECURRENT RIGHT INGUINAL HERNIA     reports that he quit smoking about 24 years ago. His smoking use included cigarettes. He quit after 20.00 years of use. He has never used smokeless tobacco. He reports current alcohol use. He reports that he does not use drugs.  Allergies  Allergen Reactions  . Bee Venom Swelling and Anaphylaxis  . Metformin And Related Nausea And Vomiting  . Sulfamethoxazole-Trimethoprim Nausea And Vomiting  . Bee Pollen Other (See Comments)    Unknown   . Metformin Hcl Other (See Comments)    sick     Family History  Problem Relation Age of Onset  .  Diabetes Mother   . Heart failure Mother     Prior to Admission medications   Medication Sig Start Date End Date Taking? Authorizing Provider  amiodarone (PACERONE) 200 MG tablet Take 1 tablet (200 mg total) by mouth daily. 09/29/20   [facility], Tami Lin, PA  apixaban (ELIQUIS) 5 MG TABS tablet Take 1 tablet (5 mg total) by mouth 2 (two) times daily. 08/18/20   Croitoru, Mihai, MD  b complex vitamins capsule Take 1 capsule by mouth every evening.    [provider]  BD PEN NEEDLE NANO 2ND GEN 32G X 4 MM MISC  07/22/20   [provider]  budesonide (ENTOCORT EC) 3 MG 24 hr capsule Take 9 mg by mouth daily.    [provider]  Cranberry 500 MG TABS Take 500 mg by mouth daily.    [provider]  Cyanocobalamin (VITAMIN B-12) 5000 MCG SUBL Place 5,000 mcg under the tongue daily.    [provider]  D-Mannose 500 MG CAPS Take 500 mg by mouth in the morning and at bedtime.    [provider]  folic  acid (FOLVITE) 1 MG tablet TAKE 1 TABLET BY MOUTH EVERY DAY Patient taking differently: Take 1 mg by mouth daily. 07/23/20   Brunetta Genera, MD  furosemide (LASIX) 20 MG tablet Take 20 mg by mouth daily.    [provider]  Insulin Glargine-Lixisenatide 100-33 UNT-MCG/ML SOPN Inject 26 Units into the skin at bedtime. Soliqua 100/33    [provider]  methotrexate (RHEUMATREX) 2.5 MG tablet Take 10 mg by mouth once a week. Caution:Chemotherapy. Protect from light.    [provider]  metoprolol tartrate (LOPRESSOR) 50 MG tablet Take 50 mg by mouth 2 (two) times daily.    [provider]  NOVOLOG FLEXPEN 100 UNIT/ML FlexPen Inject 6 Units into the skin 3 (three) times daily with meals. 02/21/19   [provider]  oxybutynin (DITROPAN) 5 MG tablet Take 5 mg by mouth daily as needed (Too slow down urine).    [provider]  pioglitazone (ACTOS) 15 MG tablet Take 15 mg by mouth every morning.  10/18/17   [provider]  traMADol (ULTRAM) 50 MG tablet Take 50 mg by mouth at bedtime as needed (restlessness).    [provider]    Physical Exam:  Constitutional: Elderly male who appears acutely ill Vitals:   12/09/20 1415 12/09/20 1430 12/09/20 1530 12/09/20 1538  BP: (!) 169/79 (!) 174/94  (!) 134/51  Pulse: 92 (!) 101  95  Resp: (!) 22 (!) 30  20  Temp:    (!) 100.5 F (38.1 C)  TempSrc:    Oral  SpO2: 100% 100%  94%  Weight:   76.2 kg   Height:   '5\' 10"'$  (1.778 m)    Eyes: PERRL, lids and conjunctivae normal ENMT: Mucous membranes are dry. Posterior pharynx clear of any exudate or lesions.Normal dentition.  Neck: normal, supple, no masses, no thyromegaly Respiratory: clear to auscultation bilaterally, no wheezing, no crackles. Normal respiratory effort. No accessory muscle use.  Cardiovascular: Tachycardic, no murmurs / rubs / gallops. No extremity edema. 2+ pedal pulses. No carotid bruits.  Abdomen: no  tenderness, no masses palpated. No hepatosplenomegaly. Bowel sounds positive.  Musculoskeletal: no clubbing / cyanosis. No joint deformity upper and lower extremities. Good ROM, no contractures. Normal muscle tone.  Skin: no rashes, lesions, ulcers. No induration Neurologic: CN 2-12 grossly intact. Sensation intact, DTR normal. Strength 5/5  in all 4.  Psychiatric: Patient seems to be repeating himself.  Alert and oriented x person and place.  Anxious mood.     Labs on Admission: I have personally reviewed following labs and imaging studies  CBC: Recent Labs  Lab 12/09/20 1100 12/09/20 1330  WBC 13.9* 18.4*  NEUTROABS 8.7* PENDING  HGB 6.9* PENDING  HCT 20.0* PENDING  MCV 113.0* PENDING  PLT PLATELET CLUMPS NOTED ON SMEAR, COUNT APPEARS ADEQUATE 711   Basic Metabolic Panel: Recent Labs  Lab 12/09/20 1100  NA 135  K 3.8  CL 103  CO2 23  GLUCOSE 133*  BUN 5*  CREATININE 0.55*  CALCIUM 7.7*   GFR: Estimated Creatinine Clearance: 76 mL/min (A) (by C-G formula based on SCr of 0.55 mg/dL (L)). Liver Function Tests: Recent Labs  Lab 12/09/20 1100  AST 24  ALT 28  ALKPHOS 82  BILITOT 0.8  PROT 5.1*  ALBUMIN 2.6*   No results for input(s): LIPASE, AMYLASE in the last 168 hours. No results for input(s): AMMONIA in the last 168 hours. Coagulation Profile: Recent Labs  Lab 12/09/20 1012  INR 1.6*   Cardiac Enzymes: No results for input(s): CKTOTAL, CKMB, CKMBINDEX, TROPONINI in the last 168 hours. BNP (last 3 results) No results for input(s): PROBNP in the last 8760 hours. HbA1C: No results for input(s): HGBA1C in the last 72 hours. CBG: No results for input(s): GLUCAP in the last 168 hours. Lipid Profile: No results for input(s): CHOL, HDL, LDLCALC, TRIG, CHOLHDL, LDLDIRECT in the last 72 hours. Thyroid Function Tests: No results for input(s): TSH, T4TOTAL, FREET4, T3FREE, THYROIDAB in the last 72 hours. Anemia Panel: No results for input(s): VITAMINB12,  FOLATE, FERRITIN, TIBC, IRON, RETICCTPCT in the last 72 hours. Urine analysis:    Component Value Date/Time   COLORURINE YELLOW 12/09/2020 1200   APPEARANCEUR CLOUDY (A) 12/09/2020 1200   LABSPEC 1.005 12/09/2020 1200   PHURINE 7.0 12/09/2020 1200   GLUCOSEU NEGATIVE 12/09/2020 1200   HGBUR LARGE (A) 12/09/2020 1200   BILIRUBINUR NEGATIVE 12/09/2020 1200   BILIRUBINUR negative 10/29/2017 1428   KETONESUR NEGATIVE 12/09/2020 1200   PROTEINUR NEGATIVE 12/09/2020 1200   UROBILINOGEN 0.2 10/29/2017 1428   NITRITE NEGATIVE 12/09/2020 1200   LEUKOCYTESUR LARGE (A) 12/09/2020 1200   Sepsis Labs: Recent Results (from the past 240 hour(s))  Resp Panel by RT-PCR (Flu A&B, Covid) Nasopharyngeal Swab     Status: None   Collection Time: 12/09/20 10:09 AM   Specimen: Nasopharyngeal Swab; Nasopharyngeal(NP) swabs in vial transport medium  Result Value Ref Range Status   SARS Coronavirus 2 by RT PCR NEGATIVE NEGATIVE Final    Comment: (NOTE) SARS-CoV-2 target nucleic acids are NOT DETECTED.  The SARS-CoV-2 RNA is generally detectable in upper respiratory specimens during the acute phase of infection. The lowest concentration of SARS-CoV-2 viral copies this assay can detect is 138 copies/mL. A negative result does not preclude SARS-Cov-2 infection and should not be used as the sole basis for treatment or other patient management decisions. A negative result may occur with  improper specimen collection/handling, submission of specimen other than nasopharyngeal swab, presence of viral mutation(s) within the areas targeted by this assay, and inadequate number of viral copies(<138 copies/mL). A negative result must be combined with clinical observations, patient history, and epidemiological information. The expected result is Negative.  Fact Sheet for Patients:  EntrepreneurPulse.com.au  Fact Sheet for Healthcare Providers:  IncredibleEmployment.be  This  test is no t yet approved or cleared by the [place]  States FDA and  has been authorized for detection and/or diagnosis of SARS-CoV-2 by FDA under an Emergency Use Authorization (EUA). This EUA will remain  in effect (meaning this test can be used) for the duration of the COVID-19 declaration under Section 564(b)(1) of the Act, 21 U.S.C.section 360bbb-3(b)(1), unless the authorization is terminated  or revoked sooner.       Influenza A by PCR NEGATIVE NEGATIVE Final   Influenza B by PCR NEGATIVE NEGATIVE Final    Comment: (NOTE) The Xpert Xpress SARS-CoV-2/FLU/RSV plus assay is intended as an aid in the diagnosis of influenza from Nasopharyngeal swab specimens and should not be used as a sole basis for treatment. Nasal washings and aspirates are unacceptable for Xpert Xpress SARS-CoV-2/FLU/RSV testing.  Fact Sheet for Patients: EntrepreneurPulse.com.au  Fact Sheet for Healthcare Providers: IncredibleEmployment.be  This test is not yet approved or cleared by the [place] FDA and has been authorized for detection and/or diagnosis of SARS-CoV-2 by FDA under an Emergency Use Authorization (EUA). This EUA will remain in effect (meaning this test can be used) for the duration of the COVID-19 declaration under Section 564(b)(1) of the Act, 21 U.S.C. section 360bbb-3(b)(1), unless the authorization is terminated or revoked.  Performed at [facility], [place] [address] [place], [place] [address]      Radiological Exams on Admission: DG Shoulder Right  Result Date: 12/09/2020 CLINICAL DATA:  fall EXAM: RIGHT SHOULDER - 2+ VIEW COMPARISON:  None. FINDINGS: There is no evidence of fracture. There is mild glenohumeral and AC joint degenerative change. IMPRESSION: No evidence of acute fracture. Mild glenohumeral and AC joint degenerative change. Electronically Signed   By: Maurine Simmering   On: 12/09/2020 13:48   CT Head Wo Contrast  Result Date:  12/09/2020 CLINICAL DATA:  Altered mental status. Unspecified minor head trauma. EXAM: CT HEAD WITHOUT CONTRAST TECHNIQUE: Contiguous axial images were obtained from the base of the skull through the vertex without intravenous contrast. COMPARISON:  11/29/2020 FINDINGS: Brain: No significant change in mild-to-moderate enlargement of the ventricles and subarachnoid spaces. Stable mild patchy white matter low density in both cerebral hemispheres. No intracranial hemorrhage, mass lesion or CT evidence of acute infarction. Vascular: No hyperdense vessel or unexpected calcification. Skull: Normal. Negative for fracture or focal lesion. Sinuses/Orbits: Status post bilateral cataract extraction. Mild bilateral frontal, ethmoid and maxillary sinus mucosal thickening. Other: None. IMPRESSION: 1. No acute abnormality. 2. Mild-to-moderate atrophy and mild chronic small vessel white matter ischemic changes in both cerebral hemispheres. Electronically Signed   By: Claudie Revering M.D.   On: 12/09/2020 12:27   DG Chest Port 1 View  Result Date: 12/09/2020 CLINICAL DATA:  Possible sepsis. EXAM: PORTABLE CHEST 1 VIEW COMPARISON:  11/06/2020 FINDINGS: Cardiac enlargement and aortic atherosclerosis. Unchanged. Loop recorder is noted along the projection of the left heart border. Chronic coarsened interstitial markings are noted throughout both lungs. No signs of pleural effusion. No airspace consolidation identified. IMPRESSION: 1. No acute cardiopulmonary abnormalities 2. Cardiac enlargement Aortic Atherosclerosis (ICD10-I70.0). Electronically Signed   By: Kerby Moors M.D.   On: 12/09/2020 10:40   DG Shoulder Left  Result Date: 12/09/2020 CLINICAL DATA:  Fall several days ago.  Shoulder pain EXAM: LEFT SHOULDER - 2+ VIEW COMPARISON:  None. FINDINGS: Mild widening of the [facility] joint. No change from prior chest x-ray 07/28/2018 Negative for fracture.  Shoulder joint normal. IMPRESSION: Mild AC separation, chronic.  Negative for  fracture Electronically Signed   By: Franchot Gallo M.D.  On: 12/09/2020 13:48    EKG: Independently reviewed.  Sinus rhythm at 67 bpm with PVCs  Assessment/Plan Sepsis secondary to UTI: Acute.  Patient presents with complaints of fever up to 103.9 F at home with urinary frequency at home.  Found to be febrile up to 101 F with tachycardia and tachypnea.  Labs significant for WBC 13.9.  Urinalysis positive for large hemoglobin, large leukocytes, negative nitrites, few bacteria, and greater than 50 WBCs.  Patient has been started empirically on cefepime. -Admit to medical telemetry bed -Follow-up blood and urine cultures -Continue empiric antibiotics without cefepime and vancomycin. -De-escalate when medically appropriate  Acute metabolic encephalopathy: Patient noted to be more confused per family with repetitive questions and difficulty remembering dates.  CT scan of the brain without any acute abnormalities.  Suspect secondary to infection as noted above. -Neuro checks  CLL with transfusion dependent anemia:  Acute on chronic.  On admission hemoglobin 6.9 g/dL.  Stool guaiacs were noted to be negative. Patient was typed and screened for the possible need of blood products.  Patient followed by Dr. Irene Limbo in the outpatient setting. -Continue with transfusion of red blood cells. -Continue to monitor  Paroxysmal atrial fibrillation: Patient followed by Dr. Keene Breath of cardiology. -Continue Eliquis, amiodarone, and metoprolol  Diastolic CHF: Chronic.  Patient does not appear to be fluid overloaded at this time. -Strict intake and output  -Daily weights  Diabetes mellitus type 2: On admission glucose 133. Last hemoglobin A1c 8.3 on 11/06/2020.  Home medication regimen includes NovoLog 6 units 3 times daily with meals, Soliqua 26 units nightly, and Actos 15 mg daily. -Hypoglycemic protocol -Held home insulin regimen -CBGs before every meal with sensitive SSI -Lantus to 10 units  nightly -Adjust regimen as needed  Frequent Falls -PT/OT to evaluate and treat  Celiac disease: Diagnosed after EGD and colonoscopy by Dr. Benson Norway.  Patient is supposed to be referred to [facility]. -Continue Entocort Hypoalbuminemia: Acute on chronic.  Albumin noted to be 2.6 on admission.  DVT prophylaxis: Eliquis Code Status: full  Family Communication:daughter updated over the phone Disposition Plan: TBD Consults called: none Admission status: Inpatient needing more than 2 midnight stay for IV antibiotics  Norval Morton MD [facility]   If 7PM-7AM, please contact night-coverage   12/09/2020, 3:50 PM

## 2020-12-09 NOTE — ED Triage Notes (Signed)
Pt arrived to ED via EMS from home. Family called EMS as pt noted with fever this am. Pt with AMS for the last 3 days and not feeling well. Pt with hx of chronic UTI's and per family this is pt normal with uti's. Family gave tylenol at 0830 and called ems. Pt is alert and oriented x 2, febrile on arrival

## 2020-12-09 NOTE — Progress Notes (Signed)
Elink following for code sepsis 

## 2020-12-09 NOTE — Progress Notes (Signed)
[facility]) Hospital Liaison note:  This is a pending outpatient-based Palliative Care patient. Will continue to follow for disposition.  Please call with any outpatient palliative questions or concerns.  Thank you, Lorelee Market, LPN [facility] Liaison [phone]

## 2020-12-09 NOTE — Progress Notes (Addendum)
Pharmacy Antibiotic Note  Tommy Peterson is a 80 y.o. male admitted on 12/09/2020 with concern for urosepsis. Pharmacy has been consulted for Cefepime dosing.  SCr 0.55, CrCl>60 ml/min.   Plan: - Cefepime 2g IV every 12 hours - Will continue to follow renal function, culture results, LOT, and antibiotic de-escalation plans   Height: 5\' 9"  (175.3 cm) Weight: 74 kg (163 lb 2.3 oz) IBW/kg (Calculated) : 70.7  Temp (24hrs), Avg:101 F (38.3 C), Min:101 F (38.3 C), Max:101 F (38.3 C)  No results for input(s): WBC, CREATININE, LATICACIDVEN, VANCOTROUGH, VANCOPEAK, VANCORANDOM, GENTTROUGH, GENTPEAK, GENTRANDOM, TOBRATROUGH, TOBRAPEAK, TOBRARND, AMIKACINPEAK, AMIKACINTROU, AMIKACIN in the last 168 hours.  Estimated Creatinine Clearance: 73.6 mL/min (by C-G formula based on SCr of 0.66 mg/dL).    Allergies  Allergen Reactions  . Bee Venom Swelling and Anaphylaxis  . Metformin And Related Nausea And Vomiting  . Sulfamethoxazole-Trimethoprim Nausea And Vomiting    Antimicrobials this admission: Cefepime 5/5 >>  Dose adjustments this admission:   Microbiology results: 5/5 UCx >> 5/5 BCx >>  Thank you for allowing pharmacy to be a part of this patient's care.  Alycia Rossetti, PharmD, BCPS Clinical Pharmacist Clinical phone for 12/09/2020: [phone] 12/09/2020 10:35 AM   **Pharmacist phone directory can now be found on [email or web address] ([email or web address]).  Listed under [place].

## 2020-12-09 NOTE — ED Notes (Signed)
Called and verified with blood bank that transfusion can be started on floor as pt is hemodynamically stable at this time

## 2020-12-10 DIAGNOSIS — N390 Urinary tract infection, site not specified: Secondary | ICD-10-CM

## 2020-12-10 DIAGNOSIS — G9341 Metabolic encephalopathy: Secondary | ICD-10-CM

## 2020-12-10 DIAGNOSIS — E119 Type 2 diabetes mellitus without complications: Secondary | ICD-10-CM | POA: Diagnosis not present

## 2020-12-10 DIAGNOSIS — I480 Paroxysmal atrial fibrillation: Secondary | ICD-10-CM | POA: Diagnosis not present

## 2020-12-10 DIAGNOSIS — A419 Sepsis, unspecified organism: Secondary | ICD-10-CM

## 2020-12-10 DIAGNOSIS — Z794 Long term (current) use of insulin: Secondary | ICD-10-CM

## 2020-12-10 LAB — BASIC METABOLIC PANEL
Anion gap: 7 (ref 5–15)
BUN: 6 mg/dL — ABNORMAL LOW (ref 8–23)
CO2: 24 mmol/L (ref 22–32)
Calcium: 8.3 mg/dL — ABNORMAL LOW (ref 8.9–10.3)
Chloride: 100 mmol/L (ref 98–111)
Creatinine, Ser: 0.53 mg/dL — ABNORMAL LOW (ref 0.61–1.24)
GFR, Estimated: >60 mL/min (ref >60)
Glucose, Bld: 227 mg/dL — ABNORMAL HIGH (ref 70–99)
Potassium: 3.9 mmol/L (ref 3.5–5.1)
Sodium: 131 mmol/L — ABNORMAL LOW (ref 135–145)

## 2020-12-10 LAB — GLUCOSE, CAPILLARY
Glucose-Capillary: 222 mg/dL — ABNORMAL HIGH (ref 70–99)
Glucose-Capillary: 230 mg/dL — ABNORMAL HIGH (ref 70–99)
Glucose-Capillary: 246 mg/dL — ABNORMAL HIGH (ref 70–99)
Glucose-Capillary: 314 mg/dL — ABNORMAL HIGH (ref 70–99)
Glucose-Capillary: 230 mg/dL — ABNORMAL HIGH (ref 70–99)

## 2020-12-10 LAB — CBC
HCT: 22.7 % — ABNORMAL LOW (ref 39.0–52.0)
Hemoglobin: 7.6 g/dL — ABNORMAL LOW (ref 13.0–17.0)
MCH: 35.3 pg — ABNORMAL HIGH (ref 26.0–34.0)
MCHC: 33.5 g/dL (ref 30.0–36.0)
MCV: 105.6 fL — ABNORMAL HIGH (ref 80.0–100.0)
Platelets: 317 10*3/uL (ref 150–400)
RBC: 2.15 MIL/uL — ABNORMAL LOW (ref 4.22–5.81)
WBC: 15.8 10*3/uL — ABNORMAL HIGH (ref 4.0–10.5)
nRBC: 0.1 % (ref 0.0–0.2)

## 2020-12-10 LAB — BPAM RBC
Blood Product Expiration Date: 202205112359
ISSUE DATE / TIME: 202205051836
Unit Type and Rh: 9500

## 2020-12-10 LAB — PREALBUMIN: Prealbumin: 7.5 mg/dL — ABNORMAL LOW (ref 18–38)

## 2020-12-10 LAB — TYPE AND SCREEN
ABO/RH(D): O POS
Antibody Screen: NEGATIVE
Unit division: 0

## 2020-12-10 MED ORDER — TAMSULOSIN HCL 0.4 MG PO CAPS
0.4000 mg | ORAL_CAPSULE | Freq: Every day | ORAL | Status: DC
  Administered 2020-12-10 – 2020-12-12 (×3): 0.4 mg via ORAL
  Filled 2020-12-10 (×3): qty 1

## 2020-12-10 MED ORDER — INSULIN ASPART 100 UNIT/ML IJ SOLN
4.0000 [IU] | Freq: Three times a day (TID) | INTRAMUSCULAR | Status: DC
  Administered 2020-12-10 – 2020-12-12 (×6): 4 [IU] via SUBCUTANEOUS

## 2020-12-10 MED ORDER — INSULIN ASPART 100 UNIT/ML IJ SOLN
0.0000 [IU] | Freq: Three times a day (TID) | INTRAMUSCULAR | Status: DC
  Administered 2020-12-10: 5 [IU] via SUBCUTANEOUS
  Administered 2020-12-10: 11 [IU] via SUBCUTANEOUS
  Administered 2020-12-11: 5 [IU] via SUBCUTANEOUS
  Administered 2020-12-11 – 2020-12-12 (×3): 3 [IU] via SUBCUTANEOUS

## 2020-12-10 MED ORDER — INSULIN ASPART 100 UNIT/ML IJ SOLN
0.0000 [IU] | Freq: Every day | INTRAMUSCULAR | Status: DC
  Administered 2020-12-10 – 2020-12-11 (×2): 2 [IU] via SUBCUTANEOUS

## 2020-12-10 MED ORDER — SODIUM CHLORIDE 0.9 % IV SOLN
1.0000 g | INTRAVENOUS | Status: DC
  Administered 2020-12-10 – 2020-12-11 (×2): 1 g via INTRAVENOUS
  Filled 2020-12-10 (×3): qty 10

## 2020-12-10 NOTE — Telephone Encounter (Signed)
Unsuccessful telephone encounter to patient to follow up on receipt of new ILR remote transmitter from [employer]. [employer] compliant VM message left requesting call back to [phone]. Patient will need to send manual transmission upon receipt of monitor.

## 2020-12-10 NOTE — Evaluation (Signed)
Physical Therapy Evaluation Patient Details Name: Tommy Peterson MRN: [id number] DOB: [birth year] Today's Date: 12/10/2020   History of Present Illness  80 y.o. male presents to ED 12/09/20  with with complaints of fever and urinary frequency 1 to 2-weeks. Pt recently hospitalized from 6/0-7/3 due to diastolic congestive heart failure exacerbation with E. coli UTI. In ED found to febrile, with tachycardia and tachypnea. WBC 13.9, hemoglobin 6.9. Admitted for treatment of sepsis due to UTI PMH: hypertension, paroxysmal atrial fibrillation on anticoagulation, CLL, chronic diarrhea, and diabetes mellitus type 2  Clinical Impression  PTA pt living in multistory home with 2-6 steps to enter and bed and bath on main level. Pt was independent in ambulation except for the last 2 days when he was using RW due to weakness. PT independent with bADLs/iADLs and driving. Pt currently limited in safe mobility by generalized weakness and associated decreased balance. Pt is mod I for rolling, supervision for bed mobility and transfers and min guard for ambulation without AD. Pt will not have and PT or equipment needs at discharge, however PT will continue to follow acutely to work on strength and balance.     Follow Up Recommendations No PT follow up;Supervision for mobility/OOB    Equipment Recommendations  None recommended by PT (has necessary equipment)       Precautions / Restrictions Precautions Precautions: Fall Precaution Comments: hx of falls, fall prior to hospitalization Restrictions Weight Bearing Restrictions: No      Mobility  Bed Mobility Overal bed mobility: Needs Assistance Bed Mobility: Rolling;Supine to Sit Rolling: Modified independent (Device/Increase time)   Supine to sit: Supervision     General bed mobility comments: mod I for rolling for pericare, supervision for coming to EoB, vc for management of external catheter    Transfers Overall transfer level: Needs  assistance Equipment used: None Transfers: Sit to/from Stand Sit to Stand: Supervision         General transfer comment: supervision for safety, good power up, braces back of legs on bed to attain balance but able to independently stand once balance achieved  Ambulation/Gait Ambulation/Gait assistance: Min guard Gait Distance (Feet): 22 Feet Assistive device: None Gait Pattern/deviations: Step-through pattern;Decreased step length - right;Decreased step length - left Gait velocity: slowed Gait velocity interpretation: <1.8 ft/sec, indicate of risk for recurrent falls General Gait Details: min guard for safety and management of lines and leads, slow mildly unsteady gait, no LoB however reaches for foot board to steady with walking around foot of bed         Balance Overall balance assessment: Needs assistance Sitting-balance support: Feet supported;Feet unsupported;No upper extremity supported Sitting balance-Leahy Scale: Normal     Standing balance support: No upper extremity supported;During functional activity Standing balance-Leahy Scale: Good                               Pertinent Vitals/Pain Pain Assessment: No/denies pain (has abrupt short duration 9/10 schocking pain, but no episodes during session)    Home Living Family/patient expects to be discharged to:: Private residence Living Arrangements: Spouse/significant other;Children Available Help at Discharge: Family;Available 24 hours/day Type of Home: House Home Access: Stairs to enter Entrance Stairs-Rails: None (Has 6 steps with rail on L) Entrance Stairs-Number of Steps: 2 Home Layout: Able to live on main level with bedroom/bathroom;Laundry or work area in [place]: Environmental consultant - 2 wheels;Grab bars - toilet;Grab bars - tub/shower Additional Comments: Has  two high school kids at home    Prior Function Level of Independence: Independent         Comments: driving and managing  medications     Hand Dominance   Dominant Hand: Left    Extremity/Trunk Assessment   Upper Extremity Assessment Upper Extremity Assessment: Defer to OT evaluation    Lower Extremity Assessment Lower Extremity Assessment: Generalized weakness       Communication   Communication: HOH  Cognition Arousal/Alertness: Awake/alert Behavior During Therapy: WFL for tasks assessed/performed Overall Cognitive Status: Within Functional Limits for tasks assessed                                 General Comments: repeats stories      General Comments General comments (skin integrity, edema, etc.): Pt on 2L via [place] on entry, SaO2 98%O2, able to maintain SaO2 >90% with ambulation and returned to 96%O2 with sitting        Assessment/Plan    PT Assessment Patient needs continued PT services  PT Problem List Decreased strength;Decreased activity tolerance;Decreased mobility       PT Treatment Interventions DME instruction;Gait training;Stair training;Functional mobility training;Therapeutic activities;Therapeutic exercise;Balance training;Cognitive remediation;Patient/family education    PT Goals (Current goals can be found in the Care Plan section)  Acute Rehab PT Goals Patient Stated Goal: stay out of hospital PT Goal Formulation: With patient Time For Goal Achievement: 12/24/20 Potential to Achieve Goals: Good    Frequency Min 3X/week    AM-PAC PT "6 Clicks" Mobility  Outcome Measure Help needed turning from your back to your side while in a flat bed without using bedrails?: None Help needed moving from lying on your back to sitting on the side of a flat bed without using bedrails?: None Help needed moving to and from a bed to a chair (including a wheelchair)?: None Help needed standing up from a chair using your arms (e.g., wheelchair or bedside chair)?: None Help needed to walk in hospital room?: A Little Help needed climbing 3-5 steps with a railing? : A  Little 6 Click Score: 22    End of Session   Activity Tolerance: Patient tolerated treatment well Patient left: in chair;with call bell/phone within reach;with chair alarm set Nurse Communication: Mobility status PT Visit Diagnosis: Unsteadiness on feet (R26.81);Other abnormalities of gait and mobility (R26.89);Muscle weakness (generalized) (M62.81);History of falling (Z91.81);Repeated falls (R29.6)    Time: 1027-2536 PT Time Calculation (min) (ACUTE ONLY): 41 min   Charges:   PT Evaluation $PT Eval Moderate Complexity: 1 Mod PT Treatments $Gait Training: 8-22 mins $Therapeutic Activity: 8-22 mins        Zyan Coby B. Migdalia Dk PT, DPT Acute Rehabilitation Services Pager [phone] Office [phone]   [place] 12/10/2020, 9:55 AM

## 2020-12-10 NOTE — Evaluation (Signed)
Occupational Therapy Evaluation Patient Details Name: Tommy Peterson MRN: [id number] DOB: 1940-12-16 Today's Date: 12/10/2020    History of Present Illness 80 y.o. male presents to ED 12/09/20  with with complaints of fever and urinary frequency 1 to 2-weeks. Pt recently hospitalized from 8/2-4/2 due to diastolic congestive heart failure exacerbation with E. coli UTI. In ED found to febrile, with tachycardia and tachypnea. WBC 13.9, hemoglobin 6.9. Admitted for treatment of sepsis due to UTI PMH: hypertension, paroxysmal atrial fibrillation on anticoagulation, CLL, chronic diarrhea, and diabetes mellitus type 2   Clinical Impression   PTA patient was living with his spouse and 2 high-school aged children in a private residence and was independent with ADLs/IADLs including driving. Patient reports use of RW x2 days prior to this admission 2/2 weakness. Patient currently functioning slightly below baseline requiring Min guard to supervision A grossly for observed ADLs. Patient would benefit from continued acute OT services in prep for safe d/c home with assist from family as needed.     Follow Up Recommendations  No OT follow up;Supervision - Intermittent    Equipment Recommendations  None recommended by OT    Recommendations for Other Services       Precautions / Restrictions Precautions Precautions: Fall Precaution Comments: hx of falls, fall prior to hospitalization Restrictions Weight Bearing Restrictions: No      Mobility Bed Mobility Overal bed mobility: Needs Assistance             General bed mobility comments: Patient seated in recliner upon entry.    Transfers Overall transfer level: Needs assistance Equipment used: None Transfers: Sit to/from Stand Sit to Stand: Supervision         General transfer comment: Supervision A for safety.    Balance Overall balance assessment: Needs assistance Sitting-balance support: Feet supported;Feet unsupported;No upper  extremity supported Sitting balance-Leahy Scale: Normal     Standing balance support: No upper extremity supported;During functional activity Standing balance-Leahy Scale: Fair Standing balance comment: 1 episode of posterior LOB landing in recliner during LB dressing in standing.                           ADL either performed or assessed with clinical judgement   ADL Overall ADL's : Needs assistance/impaired     Grooming: Supervision/safety;Standing Grooming Details (indicate cue type and reason): Hand washsing standing at sink level with supervision A.             Lower Body Dressing: Min guard;Sit to/from stand Lower Body Dressing Details (indicate cue type and reason): Min guard for steadying/safety. Patient with posterior LOB landing in recliner. Toilet Transfer: Copy Details (indicate cue type and reason): Supervision A for transfer to standard commode with supervision A for safety.         Functional mobility during ADLs: Min guard General ADL Comments: Patient limited by decreased standing balance and mild generalized weakness requiring supervision to Min guard overall for safe completion of ADLs.     Vision Baseline Vision/History: Wears glasses Wears Glasses: Reading only Patient Visual Report: No change from baseline       Perception     Praxis      Pertinent Vitals/Pain Pain Assessment: No/denies pain     Hand Dominance Left   Extremity/Trunk Assessment Upper Extremity Assessment Upper Extremity Assessment: Overall WFL for tasks assessed (5/5 and symmetrical bilaterally.)   Lower Extremity Assessment Lower Extremity Assessment: Defer to PT evaluation  Cervical / Trunk Assessment Cervical / Trunk Assessment: Kyphotic   Communication Communication Communication: HOH   Cognition Arousal/Alertness: Awake/alert Behavior During Therapy: WFL for tasks assessed/performed Overall Cognitive Status: Within  Functional Limits for tasks assessed                                 General Comments: repeats stories   General Comments  VSS on RA    Exercises     Shoulder Instructions      Home Living Family/patient expects to be discharged to:: Private residence Living Arrangements: Spouse/significant other;Children Available Help at Discharge: Family;Available 24 hours/day Type of Home: House Home Access: Stairs to enter [employer] of Steps: 2 Entrance Stairs-Rails: None (Has 6 steps with rail on L) Home Layout: Able to live on main level with bedroom/bathroom;Laundry or work area in basement     [employer] Shower/Tub: Teacher, early years/pre: Handicapped height Bathroom Accessibility: Yes   Home Equipment: Environmental consultant - 2 wheels;Grab bars - toilet;Grab bars - tub/shower   Additional Comments: Has two high school kids at home      Prior Functioning/Environment Level of Independence: Independent        Comments: driving and managing medications        OT Problem List: Impaired balance (sitting and/or standing)      OT Treatment/Interventions: Self-care/ADL training;Therapeutic exercise;Energy conservation;DME and/or AE instruction;Therapeutic activities;Patient/family education;Balance training    OT Goals(Current goals can be found in the care plan section) Acute Rehab OT Goals Patient Stated Goal: To return home. OT Goal Formulation: With patient Time For Goal Achievement: 12/24/20 Potential to Achieve Goals: Good ADL Goals Additional ADL Goal #1: Patient will complete ADLs with Mod I and AD as PRN. Additional ADL Goal #2: Patient will recall 3 fall reducing strategies in prep for safe return home.  OT Frequency: Min 2X/week   Barriers to D/C:            Co-evaluation              AM-PAC OT "6 Clicks" Daily Activity     Outcome Measure Help from another person eating meals?: None Help from another person taking care of  personal grooming?: A Little Help from another person toileting, which includes using toliet, bedpan, or urinal?: A Little Help from another person bathing (including washing, rinsing, drying)?: A Little Help from another person to put on and taking off regular upper body clothing?: None Help from another person to put on and taking off regular lower body clothing?: A Little 6 Click Score: 20   End of Session Equipment Utilized During Treatment: Gait belt Nurse Communication: Mobility status  Activity Tolerance: Patient tolerated treatment well Patient left: in chair;with call bell/phone within reach;with chair alarm set  OT Visit Diagnosis: Unsteadiness on feet (R26.81);Muscle weakness (generalized) (M62.81)                Time: 8841-6606 OT Time Calculation (min): 21 min Charges:  OT General Charges $OT Visit: 1 Visit OT Evaluation $OT Eval Low Complexity: 1 Low  Ameliah Baskins H. OTR/L Supplemental OT, Department of rehab services [phone]  Shoshanah Dapper R H. 12/10/2020, 1:14 PM

## 2020-12-10 NOTE — TOC Initial Note (Signed)
Transition of Care [facility]) - Initial/Assessment Note    Patient Details  Name: Tommy Peterson MRN: [id number] Date of Birth: 1941/01/25  Transition of [facility]) CM/SW Contact:    Bartholomew Crews, RN Phone Number: [phone] 12/10/2020, 3:59 PM  Clinical Narrative:                  Received call from [employer] [facility] concerning hospice referral from PCP. Tim had already reached out to spouse who was in agreement, and he was meeting with patient.   Spoke with patient at the bedside who stated that he was getting around fine and not needing any assistance, but he kept getting UTIs requiring hospitalization. Patient verified demographics information, PCP as listed in Epic, and preferred pharmacy. Verbal consent received to speak with his wife, Hoyle Sauer.   Spoke with Hoyle Sauer who stated over the last 6 months at least patient has had at least 4 falls resulting in cuts and bruises. He requires assistance with ADLs and getting up from chairs and ambulation. He has also experienced frequent bouts of incontinence of bowel and bladder. She discussed him having leukemia and requiring blood about every 2 weeks. She also stated that patient is not able to self manage his diabetes or medications. 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 Hoyle Sauer stated that she had spoken to Tim from [facility] concerning at home hospice services with wrap around home care services provided by [place]. Patient does have an upcoming appointment with [facility]. [facility] CSW can assist with [facility] of [facility] services. Hoyle Sauer stated that she felt like she and her daughters would be able to provide transportation home when medically ready for discharge.   TOC following for transition needs.   Expected Discharge Plan: Home w Hospice Care Barriers to Discharge: Continued Medical Work up   Patient Goals and CMS  Choice Patient states their goals for this hospitalization and ongoing recovery are:: return home with wife CMS Medicare.gov Compare Post Acute Care list provided to:: Patient Choice offered to / list presented to : [facility]  Expected Discharge Plan and Services Expected Discharge Plan: Home w Hospice Care In-house Referral: Hospice / Palliative Care Discharge Planning Services: CM Consult Post Acute Care Choice: Hospice Living arrangements for the past 2 months: Single Family Home                 DME Arranged: N/A DME Agency: NA       HH Arranged: NA HH Agency: NA        Prior Living Arrangements/Services Living arrangements for the past 2 months: Single Family Home Lives with:: Self,Spouse Patient language and need for interpreter reviewed:: Yes Do you feel safe going back to the place where you live?: Yes      Need for Family Participation in Patient Care: Yes (Comment) Care giver support system in place?: Yes (comment)   Criminal Activity/Legal Involvement Pertinent to Current Situation/Hospitalization: No - Comment as needed  Activities of Daily Living      Permission Sought/Granted Permission sought to share information with : Family Supports Permission granted to share information with : Yes, Verbal Permission Granted  Share Information with NAME: Lysle Dingwall     Permission granted to share info w Relationship: wife  Permission granted to share info w Contact Information: [phone]  Emotional Assessment Appearance:: Appears stated age Attitude/Demeanor/Rapport: Engaged,Angry Affect (typically observed): Accepting,Defensive Orientation: :  Oriented to Self,Oriented to  Time,Oriented to Place,Oriented to Situation Alcohol / Substance Use: Not Applicable Psych Involvement: No (comment)  Admission diagnosis:  Acute UTI [N39.0] Sepsis secondary to UTI ([place]) [A41.9, N39.0] Anemia, unspecified type [D64.9] Sepsis, due to unspecified organism,  unspecified whether acute organ dysfunction present [facility]) [A41.9] Patient Active Problem List   Diagnosis Date Noted  . Sepsis secondary to UTI ([place]) 12/09/2020  . Frequent falls 12/09/2020  . Acute on chronic heart failure with preserved ejection fraction (HFpEF) ([place]) 11/07/2020  . Volume overload 11/06/2020  . Chronic anticoagulation 10/11/2020  . Persistent atrial fibrillation ([place])   . Acute hypoxemic respiratory failure due to COVID-19 ([place]) 04/07/2020  . Pressure injury of skin 09/02/2018  . UTI (urinary tract infection) 09/01/2018  . Sepsis ([place]) 07/28/2018  . CAP (community acquired pneumonia) 07/28/2018  . Paroxysmal atrial fibrillation ([place]) 04/10/2018  . Aortic atherosclerosis ([place]) 04/08/2018  . DM type 2 (diabetes mellitus, type 2) ([place]) 02/19/2018  . Acute respiratory failure with hypoxia ([place]) 02/18/2018  . MGUS (monoclonal gammopathy of unknown significance) 11/23/2017  . BPH (benign prostatic hyperplasia) 11/23/2017  . Left lower lobe pneumonia 11/20/2017  . Nausea vomiting and diarrhea 11/20/2017  . Leukemia not having achieved remission ([place]) 12/26/2016  . Anemia 12/26/2016  . 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 Diabetes mellitus with complication ([place])   . Macrocytic anemia   . DOE (dyspnea on exertion)   . Anxiety   . History of GI bleed   . C. difficile colitis 05/31/2016  . Lactic acidosis   . History of bone marrow biopsy 05/27/2016  . Fever 05/27/2016  . Acute metabolic encephalopathy 90/30/0923  . Chest pain 05/04/2016  . Benign fibroma of prostate 09/20/2015  . HLD (hyperlipidemia) 09/20/2015  . Essential hypertension 09/20/2015  . Arthritis, degenerative 09/20/2015  . Colitis, collagenous 09/09/2015  . Absolute anemia 09/08/2014  . Adenoma of large intestine 07/13/2014  . Disturbance of skin sensation 01/09/2014  . Recurrent major depressive disorder, in full remission ([place]) 07/18/2012  . Allergic rhinitis 08/29/2011    PCP:  [name], DO Pharmacy:   [facility], [address] [place] [address] Phone: [phone] Fax: [phone]  [id number] - [place], [address] AT [address] [place] [address] Phone: [phone] Fax: [phone]     Social Determinants of Health (SDOH) Interventions    Readmission Risk Interventions No flowsheet data found.

## 2020-12-10 NOTE — Plan of Care (Signed)
  Problem: Education: Goal: Knowledge of General Education information will improve Description Including pain rating scale, medication(s)/side effects and non-pharmacologic comfort measures Outcome: Progressing   

## 2020-12-10 NOTE — Progress Notes (Signed)
[facility] PROGRESS NOTE    Progress Note  Tommy Peterson  QAS:[id number] DOB: [birth year] DOA: 12/09/2020 PCP: [name], DO     Brief Narrative:   Tommy Peterson is an 80 y.o. male past medical history of essential hypertension, paroxysmal atrial fibrillation on Eliquis, CLL, chronic diarrhea, diabetes mellitus type 2 with a last hemoglobin A1c of 8.3, recently discharged from the hospital for an acute diastolic heart failure exacerbation and E. coli UTI comes in to the hospital complaining of intermittent fevers and urinary frequency.  Significant studies: 12/09/2020 chest x-ray showed no acute cardiopulmonary abnormalities. 12/09/2020 CT of the head showed atrophy with no acute intracranial abnormalities. 12/09/2020 right and left shoulder x-ray showed dislocation there is a mild chronic separation of the [facility] joint.  Antibiotics: 1 dose of IV cefepime on 12/09/2020. 12/10/2020 IV Rocephin  Microbiology data: 12/09/2020 blood culture:  Procedures: None  Assessment/Plan:   Severe sepsis secondary to UTI: Urine cultures and blood cultures were sent. He was started on IV vancomycin and cefepime will de-escalate to IV Rocephin. He was already fluid resuscitated in the ED. There is likely in the setting of BPH he relates his stream has gotten smaller, he also relates frequency, we will go ahead and start him on oral Flomax.  Acute metabolic encephalopathy Likely due to infectious etiology, CT of the head showed no acute findings.  CLL with transfusion dependent anemia: On admission his hemoglobin was 6.9.  Agree with 1 unit of packed red blood cells recheck in the morning a CBC.  Paroxysmal atrial fibrillation: Continue Eliquis amiodarone and metoprolol.  Chronic diastolic heart failure: Appears euvolemic on physical exam, continue strict I's and O's Daily weights. With holding Lasix for the next 24 hours.  DM type 2 (diabetes mellitus, type 2) ([place]) Hold oral  hypoglycemic agents. Agree with long-acting insulin, Start sliding scale insulin.  Multiple Frequent falls Consult PT and OT.  Stage II sacral decubitus ulcer present on admission: RN Pressure Injury Documentation: Pressure Injury 09/01/18 Stage II -  Partial thickness loss of dermis presenting as a shallow open ulcer with a red, pink wound bed without slough. (Active)  09/01/18 1430  Location: Sacrum  Location Orientation: Mid  Staging: Stage II -  Partial thickness loss of dermis presenting as a shallow open ulcer with a red, pink wound bed without slough.  Wound Description (Comments):   Present on Admission: Yes    Estimated body mass index is 24.1 kg/m as calculated from the following:   Height as of this encounter: 5\' 10"  (1.778 m).   Weight as of this encounter: 76.2 kg.   DVT prophylaxis: Eliquis Family Communication:None Status is: Inpatient  Remains inpatient appropriate because:Hemodynamically unstable   Dispo: The patient is from: Home              Anticipated d/c is to: Home              Patient currently is not medically stable to d/c.   Difficult to place patient No Code Status:     Code Status Orders  (From admission, onward)         Start     Ordered   12/09/20 1417  Full code  Continuous        12/09/20 1418        Code Status History    Date Active Date Inactive Code Status Order ID Comments User Context   11/06/2020 0951 11/08/2020 1757 Full Code [id number]  Cherylann Ratel  A, DO Inpatient   04/07/2020 1250 04/10/2020 1821 Full Code [id number]  Harold Hedge, MD ED   09/01/2018 0445 09/03/2018 1504 Full Code [id number]  Ivor Costa, MD ED   07/28/2018 2309 07/30/2018 1538 Full Code [id number]  Norval Morton, MD ED   11/20/2017 0625 11/24/2017 1827 Full Code [id number]  Etta Quill, DO ED   12/26/2016 1700 12/27/2016 2028 Full Code [id number]  Dionne Milo, NP Inpatient   05/27/2016 2320 06/01/2016 1832 Full Code [id number]  Phillips Grout, MD  Inpatient   05/04/2016 2241 05/06/2016 1242 Full Code [id number]  Jani Gravel, MD Inpatient   Advance Care Planning Activity        IV Access:    Peripheral IV   Procedures and diagnostic studies:   DG Shoulder Right  Result Date: 12/09/2020 CLINICAL DATA:  fall EXAM: RIGHT SHOULDER - 2+ VIEW COMPARISON:  None. FINDINGS: There is no evidence of fracture. There is mild glenohumeral and AC joint degenerative change. IMPRESSION: No evidence of acute fracture. Mild glenohumeral and AC joint degenerative change. Electronically Signed   By: Maurine Simmering   On: 12/09/2020 13:48   CT Head Wo Contrast  Result Date: 12/09/2020 CLINICAL DATA:  Altered mental status. Unspecified minor head trauma. EXAM: CT HEAD WITHOUT CONTRAST TECHNIQUE: Contiguous axial images were obtained from the base of the skull through the vertex without intravenous contrast. COMPARISON:  11/29/2020 FINDINGS: Brain: No significant change in mild-to-moderate enlargement of the ventricles and subarachnoid spaces. Stable mild patchy white matter low density in both cerebral hemispheres. No intracranial hemorrhage, mass lesion or CT evidence of acute infarction. Vascular: No hyperdense vessel or unexpected calcification. Skull: Normal. Negative for fracture or focal lesion. Sinuses/Orbits: Status post bilateral cataract extraction. Mild bilateral frontal, ethmoid and maxillary sinus mucosal thickening. Other: None. IMPRESSION: 1. No acute abnormality. 2. Mild-to-moderate atrophy and mild chronic small vessel white matter ischemic changes in both cerebral hemispheres. Electronically Signed   By: Claudie Revering M.D.   On: 12/09/2020 12:27   DG Chest Port 1 View  Result Date: 12/09/2020 CLINICAL DATA:  Possible sepsis. EXAM: PORTABLE CHEST 1 VIEW COMPARISON:  11/06/2020 FINDINGS: Cardiac enlargement and aortic atherosclerosis. Unchanged. Loop recorder is noted along the projection of the left heart border. Chronic coarsened interstitial markings  are noted throughout both lungs. No signs of pleural effusion. No airspace consolidation identified. IMPRESSION: 1. No acute cardiopulmonary abnormalities 2. Cardiac enlargement Aortic Atherosclerosis (ICD10-I70.0). Electronically Signed   By: Kerby Moors M.D.   On: 12/09/2020 10:40   DG Shoulder Left  Result Date: 12/09/2020 CLINICAL DATA:  Fall several days ago.  Shoulder pain EXAM: LEFT SHOULDER - 2+ VIEW COMPARISON:  None. FINDINGS: Mild widening of the [facility] joint. No change from prior chest x-ray 07/28/2018 Negative for fracture.  Shoulder joint normal. IMPRESSION: Mild AC separation, chronic.  Negative for fracture Electronically Signed   By: Franchot Gallo M.D.   On: 12/09/2020 13:48     Medical Consultants:    None.   Subjective:    Tommy Peterson he relates feels mildly better than yesterday  Objective:    Vitals:   12/09/20 1830 12/09/20 1858 12/09/20 2200 12/10/20 0512  BP: (!) 119/49 (!) 117/51 137/71 (!) 123/51  Pulse: 83 77 79 62  Resp: 20 18 18 17   Temp: 100 F (37.8 C) 99.6 F (37.6 C) 99.7 F (37.6 C) 98.4 F (36.9 C)  TempSrc: Oral Oral Oral   SpO2: 92% 98% 95% 92%  Weight:      Height:       SpO2: 92 % O2 Flow Rate (L/min): 2 L/min   Intake/Output Summary (Last 24 hours) at 12/10/2020 0816 Last data filed at 12/10/2020 0600 Gross per 24 hour  Intake 3813.23 ml  Output 2850 ml  Net 963.23 ml   Filed Weights   12/09/20 0945 12/09/20 1530  Weight: 74 kg 76.2 kg    Exam: General exam: In no acute distress. Respiratory system: Good air movement and clear to auscultation. Cardiovascular system: S1 & S2 heard, RRR. No JVD.  Gastrointestinal system: Abdomen is nondistended, soft and nontender.  Extremities: No pedal edema. Skin: No rashes, lesions or ulcers Psychiatry: Judgement and insight appear normal. Mood & affect appropriate.    Data Reviewed:    Labs: Basic Metabolic Panel: Recent Labs  Lab 12/09/20 1100 12/10/20 0432  NA 135  131*  K 3.8 3.9  CL 103 100  CO2 23 24  GLUCOSE 133* 227*  BUN 5* 6*  CREATININE 0.55* 0.53*  CALCIUM 7.7* 8.3*   GFR Estimated Creatinine Clearance: 76 mL/min (A) (by C-G formula based on SCr of 0.53 mg/dL (L)). Liver Function Tests: Recent Labs  Lab 12/09/20 1100  AST 24  ALT 28  ALKPHOS 82  BILITOT 0.8  PROT 5.1*  ALBUMIN 2.6*   No results for input(s): LIPASE, AMYLASE in the last 168 hours. No results for input(s): AMMONIA in the last 168 hours. Coagulation profile Recent Labs  Lab 12/09/20 1012  INR 1.6*   COVID-19 Labs  No results for input(s): DDIMER, FERRITIN, LDH, CRP in the last 72 hours.  Lab Results  Component Value Date   SARSCOV2NAA NEGATIVE 12/09/2020   [place] NEGATIVE 11/06/2020   [place] NEGATIVE 11/02/2020   [place] NEGATIVE 08/27/2020    CBC: Recent Labs  Lab 12/09/20 1100 12/09/20 1330  WBC 13.9* 18.4*  NEUTROABS 8.7* 11.8*  HGB 6.9* 7.9*  HCT 20.0* 25.1*  MCV 113.0* 111.6*  PLT PLATELET CLUMPS NOTED ON SMEAR, COUNT APPEARS ADEQUATE 320   Cardiac Enzymes: No results for input(s): CKTOTAL, CKMB, CKMBINDEX, TROPONINI in the last 168 hours. BNP (last 3 results) No results for input(s): PROBNP in the last 8760 hours. CBG: Recent Labs  Lab 12/10/20 0138 12/10/20 0644  GLUCAP 222* 230*   D-Dimer: No results for input(s): DDIMER in the last 72 hours. Hgb A1c: No results for input(s): HGBA1C in the last 72 hours. Lipid Profile: No results for input(s): CHOL, HDL, LDLCALC, TRIG, CHOLHDL, LDLDIRECT in the last 72 hours. Thyroid function studies: No results for input(s): TSH, T4TOTAL, T3FREE, THYROIDAB in the last 72 hours.  Invalid input(s): FREET3 Anemia work up: No results for input(s): VITAMINB12, FOLATE, FERRITIN, TIBC, IRON, RETICCTPCT in the last 72 hours. Sepsis Labs: Recent Labs  Lab 12/09/20 1100 12/09/20 1114 12/09/20 1330  WBC 13.9*  --  18.4*  LATICACIDVEN  --  1.0  --    Microbiology Recent  Results (from the past 240 hour(s))  Resp Panel by RT-PCR (Flu A&B, Covid) Nasopharyngeal Swab     Status: None   Collection Time: 12/09/20 10:09 AM   Specimen: Nasopharyngeal Swab; Nasopharyngeal(NP) swabs in vial transport medium  Result Value Ref Range Status   SARS Coronavirus 2 by RT PCR NEGATIVE NEGATIVE Final    Comment: (NOTE) SARS-CoV-2 target nucleic acids are NOT DETECTED.  The SARS-CoV-2 RNA is generally detectable in upper respiratory specimens during the acute phase of infection. The lowest concentration of SARS-CoV-2 viral copies this  assay can detect is 138 copies/mL. A negative result does not preclude SARS-Cov-2 infection and should not be used as the sole basis for treatment or other patient management decisions. A negative result may occur with  improper specimen collection/handling, submission of specimen other than nasopharyngeal swab, presence of viral mutation(s) within the areas targeted by this assay, and inadequate number of viral copies(<138 copies/mL). A negative result must be combined with clinical observations, patient history, and epidemiological information. The expected result is Negative.  Fact Sheet for Patients:  EntrepreneurPulse.com.au  Fact Sheet for Healthcare Providers:  IncredibleEmployment.be  This test is no t yet approved or cleared by the [place] FDA and  has been authorized for detection and/or diagnosis of SARS-CoV-2 by FDA under an Emergency Use Authorization (EUA). This EUA will remain  in effect (meaning this test can be used) for the duration of the COVID-19 declaration under Section 564(b)(1) of the Act, 21 U.S.C.section 360bbb-3(b)(1), unless the authorization is terminated  or revoked sooner.       Influenza A by PCR NEGATIVE NEGATIVE Final   Influenza B by PCR NEGATIVE NEGATIVE Final    Comment: (NOTE) The Xpert Xpress SARS-CoV-2/FLU/RSV plus assay is intended as an aid in the  diagnosis of influenza from Nasopharyngeal swab specimens and should not be used as a sole basis for treatment. Nasal washings and aspirates are unacceptable for Xpert Xpress SARS-CoV-2/FLU/RSV testing.  Fact Sheet for Patients: EntrepreneurPulse.com.au  Fact Sheet for Healthcare Providers: IncredibleEmployment.be  This test is not yet approved or cleared by the [place] FDA and has been authorized for detection and/or diagnosis of SARS-CoV-2 by FDA under an Emergency Use Authorization (EUA). This EUA will remain in effect (meaning this test can be used) for the duration of the COVID-19 declaration under Section 564(b)(1) of the Act, 21 U.S.C. section 360bbb-3(b)(1), unless the authorization is terminated or revoked.  Performed at [facility], [place] [address] [place], [place] [address]      Medications:   . amiodarone  200 mg Oral Daily  . apixaban  5 mg Oral BID  . budesonide  9 mg Oral Daily  . folic acid  1 mg Oral Daily  . insulin aspart  0-9 Units Subcutaneous TID WC  . insulin glargine  10 Units Subcutaneous QHS  . metoprolol tartrate  50 mg Oral BID  . sodium chloride flush  3 mL Intravenous Q12H  . vitamin B-12  5,000 mcg Oral Daily   Continuous Infusions: . sodium chloride    . ceFEPime (MAXIPIME) IV 2 g (12/09/20 2222)      LOS: 1 day   [facility]  12/10/2020, 8:16 AM

## 2020-12-11 DIAGNOSIS — N390 Urinary tract infection, site not specified: Secondary | ICD-10-CM | POA: Diagnosis not present

## 2020-12-11 DIAGNOSIS — A419 Sepsis, unspecified organism: Secondary | ICD-10-CM | POA: Diagnosis not present

## 2020-12-11 DIAGNOSIS — G9341 Metabolic encephalopathy: Secondary | ICD-10-CM | POA: Diagnosis not present

## 2020-12-11 LAB — URINE CULTURE: Culture: >=100000 — AB

## 2020-12-11 LAB — GLUCOSE, CAPILLARY
Glucose-Capillary: 178 mg/dL — ABNORMAL HIGH (ref 70–99)
Glucose-Capillary: 170 mg/dL — ABNORMAL HIGH (ref 70–99)
Glucose-Capillary: 222 mg/dL — ABNORMAL HIGH (ref 70–99)
Glucose-Capillary: 241 mg/dL — ABNORMAL HIGH (ref 70–99)

## 2020-12-11 MED ORDER — NAPHAZOLINE-GLYCERIN 0.012-0.25 % OP SOLN
1.0000 [drp] | Freq: Four times a day (QID) | OPHTHALMIC | Status: DC | PRN
  Administered 2020-12-11: 2 [drp] via OPHTHALMIC
  Filled 2020-12-11: qty 15

## 2020-12-11 MED ORDER — CEFUROXIME AXETIL 500 MG PO TABS: 500.0000 mg | ORAL_TABLET | Freq: Two times a day (BID) | ORAL | 0 refills | Status: DC

## 2020-12-11 NOTE — Progress Notes (Signed)
Progress Note    Tommy Peterson   [id number]  DOB: [birth year]  DOA: 12/09/2020     2  PCP: [name], DO  CC: Fever, urinary frequency  Hospital Course: Tommy Peterson is an 80 y.o. male past medical history of essential hypertension, paroxysmal atrial fibrillation on Eliquis, CLL, chronic diarrhea, diabetes mellitus type 2 with a last hemoglobin A1c of 8.3, recently discharged from the hospital for an acute diastolic heart failure exacerbation and E. coli UTI comes in to the hospital complaining of intermittent fevers and urinary frequency.  He was initially started on cefepime which was transitioned to Rocephin.  He again grew E. coli in his urine cultures.  His symptoms improved and he felt better after treatment in the hospital. He was also evaluated by PT/OT with no home needs identified.  However, patient's wife endorsed hesitation with bringing the patient home due to difficulty caring for him.  Interval History:  No events overnight.  Patient feeling better this morning sitting up in bed and wishing to go home.  ROS: Constitutional: negative for chills and fevers, Respiratory: negative for cough, Cardiovascular: negative for chest pain and Gastrointestinal: negative for abdominal pain  Assessment & Plan: Severe sepsis secondary to UTI: -Urine culture growing 2 strains of E. coli.  Both sensitive to Rocephin and cephalosporins. - Should theoretically be able to discharge on cefuroxime to complete course  Multiple Frequent falls Consult PT and OT. - apparently no home needs? But wife adamant about not bringing patient home on 5/7 when discharge attempted - TOC consult to see if wife wishes for placement? - wife also states she is talking with hospice to see if home can be prepared for patient to return also  BPH - Continue Flomax  Acute metabolic encephalopathy Likely due to infectious etiology, CT of the head showed no acute findings.  CLL with  transfusion dependent anemia: On admission his hemoglobin was 6.9.  Given 1 unit PRBC  Paroxysmal atrial fibrillation: Continue Eliquis amiodarone and metoprolol.  Chronic diastolic heart failure: Appears euvolemic on physical exam, continue strict I's and O's Daily weights.  DM type 2 (diabetes mellitus, type 2) Hold oral hypoglycemic agents. - A1c 8.3% - continue SSI  Stage II sacral decubitus ulcer present on admission: RN Pressure Injury Documentation: Pressure Injury 09/01/18 Stage II -  Partial thickness loss of dermis presenting as a shallow open ulcer with a red, pink wound bed without slough. (Active)  09/01/18 1430  Location: Sacrum  Location Orientation: Mid  Staging: Stage II -  Partial thickness loss of dermis presenting as a shallow open ulcer with a red, pink wound bed without slough.  Wound Description (Comments):   Present on Admission: Yes     Old records reviewed in assessment of this patient  Antimicrobials: Cefepime 5/5 x 2 Rocephin 5/6 >> current  DVT prophylaxis:  apixaban (ELIQUIS) tablet 5 mg   Code Status:   Code Status: Full Code Family Communication: wife  Disposition Plan: Status is: Inpatient  Remains inpatient appropriate because:Unsafe d/c plan and Inpatient level of care appropriate due to severity of illness   Dispo: The patient is from: Home              Anticipated d/c is to: pending wife and further PT/OT discussions              Patient currently is medically stable to d/c.   Difficult to place patient No  Risk of unplanned readmission score: Unplanned Admission-  Pilot do not use: 29.88   Objective: Blood pressure (!) 121/54, pulse 67, temperature 98 F (36.7 C), temperature source Oral, resp. rate 16, height 5\' 10"  (1.778 m), weight 76.2 kg, SpO2 96 %.  Examination: General appearance: alert, cooperative and no distress Head: Normocephalic, without obvious abnormality, atraumatic Eyes: EOMI Lungs: clear to  auscultation bilaterally Heart: regular rate and rhythm and S1, S2 normal Abdomen: normal findings: bowel sounds normal and soft, non-tender Extremities: no edema Skin: mobility and turgor normal Neurologic: Grossly normal  Consultants:   n/a  Procedures:   n/a  Data Reviewed: I have personally reviewed following labs and imaging studies Results for orders placed or performed during the hospital encounter of 12/09/20 (from the past 24 hour(s))  Glucose, capillary     Status: Abnormal   Collection Time: 12/10/20  4:20 PM  Result Value Ref Range   Glucose-Capillary 314 (H) 70 - 99 mg/dL  Glucose, capillary     Status: Abnormal   Collection Time: 12/10/20 10:45 PM  Result Value Ref Range   Glucose-Capillary 230 (H) 70 - 99 mg/dL  Glucose, capillary     Status: Abnormal   Collection Time: 12/11/20  7:48 AM  Result Value Ref Range   Glucose-Capillary 178 (H) 70 - 99 mg/dL  Glucose, capillary     Status: Abnormal   Collection Time: 12/11/20  1:05 PM  Result Value Ref Range   Glucose-Capillary 170 (H) 70 - 99 mg/dL    Recent Results (from the past 240 hour(s))  Urine culture     Status: Abnormal   Collection Time: 12/09/20  9:53 AM   Specimen: In/Out Cath Urine  Result Value Ref Range Status   Specimen Description IN/OUT CATH URINE  Final   Special Requests NONE  Final   Culture (A)  Final    >=100,000 COLONIES/mL ESCHERICHIA COLI 50,000 COLONIES/mL ESCHERICHIA COLI Two isolates with different morphologies were identified as the same organism.The most resistant organism was reported. Performed at [facility], [place] [address] [place], [place] [address]    Report Status 12/11/2020 FINAL  Final   Organism ID, Bacteria ESCHERICHIA COLI (A)  Final   Organism ID, Bacteria ESCHERICHIA COLI (A)  Final      Susceptibility   Escherichia coli - MIC*    AMPICILLIN >=32 RESISTANT Resistant     CEFAZOLIN 16 SENSITIVE Sensitive     CEFEPIME <=0.12 SENSITIVE Sensitive      CEFTRIAXONE <=0.25 SENSITIVE Sensitive     CIPROFLOXACIN <=0.25 SENSITIVE Sensitive     GENTAMICIN <=1 SENSITIVE Sensitive     IMIPENEM <=0.25 SENSITIVE Sensitive     NITROFURANTOIN <=16 SENSITIVE Sensitive     TRIMETH/SULFA 40 SENSITIVE Sensitive     AMPICILLIN/SULBACTAM >=32 RESISTANT Resistant     PIP/TAZO <=4 SENSITIVE Sensitive    Escherichia coli - MIC*    AMPICILLIN >=32 RESISTANT Resistant     CEFAZOLIN >=64 RESISTANT Resistant     CEFEPIME <=0.12 SENSITIVE Sensitive     CEFTRIAXONE <=0.25 SENSITIVE Sensitive     CIPROFLOXACIN <=0.25 SENSITIVE Sensitive     GENTAMICIN <=1 SENSITIVE Sensitive     IMIPENEM <=0.25 SENSITIVE Sensitive     NITROFURANTOIN <=16 SENSITIVE Sensitive     TRIMETH/SULFA 40 SENSITIVE Sensitive     AMPICILLIN/SULBACTAM >=32 RESISTANT Resistant     PIP/TAZO >=128 RESISTANT Resistant     * >=100,000 COLONIES/mL ESCHERICHIA COLI    50,000 COLONIES/mL ESCHERICHIA COLI  Resp Panel by RT-PCR (Flu A&B, Covid) Nasopharyngeal Swab  Status: None   Collection Time: 12/09/20 10:09 AM   Specimen: Nasopharyngeal Swab; Nasopharyngeal(NP) swabs in vial transport medium  Result Value Ref Range Status   SARS Coronavirus 2 by RT PCR NEGATIVE NEGATIVE Final    Comment: (NOTE) SARS-CoV-2 target nucleic acids are NOT DETECTED.  The SARS-CoV-2 RNA is generally detectable in upper respiratory specimens during the acute phase of infection. The lowest concentration of SARS-CoV-2 viral copies this assay can detect is 138 copies/mL. A negative result does not preclude SARS-Cov-2 infection and should not be used as the sole basis for treatment or other patient management decisions. A negative result may occur with  improper specimen collection/handling, submission of specimen other than nasopharyngeal swab, presence of viral mutation(s) within the areas targeted by this assay, and inadequate number of viral copies(<138 copies/mL). A negative result must be combined  with clinical observations, patient history, and epidemiological information. The expected result is Negative.  Fact Sheet for Patients:  EntrepreneurPulse.com.au  Fact Sheet for Healthcare Providers:  IncredibleEmployment.be  This test is no t yet approved or cleared by the [place] FDA and  has been authorized for detection and/or diagnosis of SARS-CoV-2 by FDA under an Emergency Use Authorization (EUA). This EUA will remain  in effect (meaning this test can be used) for the duration of the COVID-19 declaration under Section 564(b)(1) of the Act, 21 U.S.C.section 360bbb-3(b)(1), unless the authorization is terminated  or revoked sooner.       Influenza A by PCR NEGATIVE NEGATIVE Final   Influenza B by PCR NEGATIVE NEGATIVE Final    Comment: (NOTE) The Xpert Xpress SARS-CoV-2/FLU/RSV plus assay is intended as an aid in the diagnosis of influenza from Nasopharyngeal swab specimens and should not be used as a sole basis for treatment. Nasal washings and aspirates are unacceptable for Xpert Xpress SARS-CoV-2/FLU/RSV testing.  Fact Sheet for Patients: EntrepreneurPulse.com.au  Fact Sheet for Healthcare Providers: IncredibleEmployment.be  This test is not yet approved or cleared by the [place] FDA and has been authorized for detection and/or diagnosis of SARS-CoV-2 by FDA under an Emergency Use Authorization (EUA). This EUA will remain in effect (meaning this test can be used) for the duration of the COVID-19 declaration under Section 564(b)(1) of the Act, 21 U.S.C. section 360bbb-3(b)(1), unless the authorization is terminated or revoked.  Performed at [facility], [place] [address], [place], [place] [address]   Blood Culture (routine x 2)     Status: None (Preliminary result)   Collection Time: 12/09/20 10:09 AM   Specimen: BLOOD LEFT FOREARM  Result Value Ref Range Status   Specimen  Description BLOOD LEFT FOREARM  Final   Special Requests   Final    BOTTLES DRAWN AEROBIC AND ANAEROBIC Blood Culture results may not be optimal due to an inadequate volume of blood received in culture bottles   Culture   Final    NO GROWTH 2 DAYS Performed at [facility], [place] [address], [place], [place] [address]    Report Status PENDING  Incomplete  Blood Culture (routine x 2)     Status: None (Preliminary result)   Collection Time: 12/09/20 10:10 AM   Specimen: BLOOD LEFT ARM  Result Value Ref Range Status   Specimen Description BLOOD LEFT ARM  Final   Special Requests   Final    BOTTLES DRAWN AEROBIC AND ANAEROBIC Blood Culture adequate volume   Culture   Final    NO GROWTH 2 DAYS Performed at [facility]  Lab, [address], [place], [place] [address]    Report Status PENDING  Incomplete     Radiology Studies: No results found. DG Shoulder Right  Final Result    DG Shoulder Left  Final Result    CT Head Wo Contrast  Final Result    DG Chest Port 1 View  Final Result      Scheduled Meds: . amiodarone  200 mg Oral Daily  . apixaban  5 mg Oral BID  . budesonide  9 mg Oral Daily  . folic acid  1 mg Oral Daily  . insulin aspart  0-15 Units Subcutaneous TID WC  . insulin aspart  0-5 Units Subcutaneous QHS  . insulin aspart  4 Units Subcutaneous TID WC  . insulin glargine  10 Units Subcutaneous QHS  . metoprolol tartrate  50 mg Oral BID  . sodium chloride flush  3 mL Intravenous Q12H  . tamsulosin  0.4 mg Oral QPC breakfast  . vitamin B-12  5,000 mcg Oral Daily   PRN Meds: acetaminophen **OR** acetaminophen, albuterol, traMADol Continuous Infusions: . sodium chloride    . cefTRIAXone (ROCEPHIN)  IV 1 g (12/11/20 1015)     LOS: 2 days  Time spent: Greater than 50% of the 35 minute visit was spent in counseling/coordination of care for the patient as laid out in the A&P.   Dwyane Dee, MD [facility] 12/11/2020, 2:29 PM

## 2020-12-11 NOTE — Progress Notes (Signed)
Patient's wife Tommy Peterson has requested to speak with MD before patient discharges from the hospital, MD has been notified and RN will continue to monitor this patient.

## 2020-12-12 DIAGNOSIS — A419 Sepsis, unspecified organism: Secondary | ICD-10-CM | POA: Diagnosis not present

## 2020-12-12 DIAGNOSIS — G9341 Metabolic encephalopathy: Secondary | ICD-10-CM | POA: Diagnosis not present

## 2020-12-12 DIAGNOSIS — I480 Paroxysmal atrial fibrillation: Secondary | ICD-10-CM | POA: Diagnosis not present

## 2020-12-12 DIAGNOSIS — E119 Type 2 diabetes mellitus without complications: Secondary | ICD-10-CM | POA: Diagnosis not present

## 2020-12-12 MED ORDER — TAMSULOSIN HCL 0.4 MG PO CAPS: 0.4000 mg | ORAL_CAPSULE | Freq: Every day | ORAL | 3 refills | Status: DC

## 2020-12-12 MED ORDER — CEFUROXIME AXETIL 500 MG PO TABS: 500.0000 mg | ORAL_TABLET | Freq: Two times a day (BID) | ORAL | 0 refills | Status: AC

## 2020-12-12 NOTE — Discharge Summary (Addendum)
Physician Discharge Summary  Tommy Peterson ZOX:[id number] DOB: 05/22/1941 DOA: 12/09/2020  PCP: Irena Reichmannollins, Dana, DO  Admit date: 12/09/2020 Discharge date: 12/12/2020  Admitted From: Home Disposition:  Home  Recommendations for Outpatient Follow-up:  1. Follow up with PCP in 1-2 weeks 2. Please obtain BMP/CBC in one week   Home Health:No Equipment/Devices:None  Discharge Condition:Stable CODE STATUS:Full Diet recommendation: Heart Healthy /  Brief/Interim Summary: 80 y.o. male past medical history of essential hypertension, paroxysmal atrial fibrillation on Eliquis, CLL, chronic diarrhea, diabetes mellitus type 2 with a last hemoglobin A1c of 8.3, recently discharged from the hospital for an acute diastolic heart failure exacerbation and E. coli UTI comes in to the hospital complaining of intermittent fevers and urinary frequency.  Discharge Diagnoses:  Active Problems:   Acute metabolic encephalopathy   DM type 2 (diabetes mellitus, type 2) (HCC)   Paroxysmal atrial fibrillation (HCC)   Sepsis secondary to UTI (HCC)   Frequent falls  Severe sepsis secondary to UTI in the setting of BPH: Urine cultures and blood cultures were sent, blood cultures remain negative. Urine culture grew a sensitive sensitive to) which should continue as an outpatient for 3 additional days. He had some BPH he was started on Flomax and he could tell a difference in his stream strength and caliber so he was discharged on Flomax we will continue as an outpatient.  Acute metabolic encephalopathy: Likely due to infectious etiology CT of the head negative showed no acute findings.  CLL with transfusion dependent anemia: On admission he was transfused 1 unit of packed red blood cells his hemoglobin was 6.9 and remained stable after this.  Paroxysmal atrial fibrillation no changes made to his medication.  Chronic diastolic heart failure: His Lasix was held for 24 hours no further changes he will resume it  as an outpatient.  Diabetes mellitus type 2: No changes made to his medication.  Frequent falls: Physical therapy was consulted recommended no follow-up as an outpatient.  Discharge Instructions  Discharge Instructions    Diet - low sodium heart healthy   Complete by: As directed    Increase activity slowly   Complete by: As directed    Increase activity slowly   Complete by: As directed    No wound care   Complete by: As directed    No wound care   Complete by: As directed      Allergies as of 12/12/2020      Reactions   Bee Venom Swelling, Anaphylaxis   Metformin And Related Nausea And Vomiting   Sulfamethoxazole-trimethoprim Nausea And Vomiting   Bee Pollen Other (See Comments)   Unknown   Metformin Hcl Other (See Comments)   sick      Medication List    STOP taking these medications   amoxicillin 500 MG capsule Commonly known as: AMOXIL     TAKE these medications   amiodarone 200 MG tablet Commonly known as: PACERONE Take 1 tablet (200 mg total) by mouth daily.   apixaban 5 MG Tabs tablet Commonly known as: Eliquis Take 1 tablet (5 mg total) by mouth 2 (two) times daily.   b complex vitamins capsule Take 1 capsule by mouth every evening.   BD Pen Needle Nano 2nd Gen 32G X 4 MM Misc Generic drug: Insulin Pen Needle   budesonide 3 MG 24 hr capsule Commonly known as: ENTOCORT EC Take 9 mg by mouth daily.   cefUROXime 500 MG tablet Commonly known as: CEFTIN Take 1 tablet (500 mg  total) by mouth 2 (two) times daily with a meal for 3 days.   Cranberry 500 MG Tabs Take 500 mg by mouth daily.   D-Mannose 500 MG Caps Take 500 mg by mouth in the morning and at bedtime.   folic acid 1 MG tablet Commonly known as: FOLVITE TAKE 1 TABLET BY MOUTH EVERY DAY   furosemide 20 MG tablet Commonly known as: LASIX Take 20 mg by mouth daily.   Insulin Glargine-Lixisenatide 100-33 UNT-MCG/ML Sopn Inject 26 Units into the skin at bedtime. Soliqua 100/33    methotrexate 2.5 MG tablet Commonly known as: RHEUMATREX Take 30 mg by mouth once a week. Take every Tuesday. Caution: Chemotherapy. Protect from light.   metoprolol tartrate 50 MG tablet Commonly known as: LOPRESSOR Take 50 mg by mouth 2 (two) times daily.   NovoLOG FlexPen 100 UNIT/ML FlexPen Generic drug: insulin aspart Inject 6 Units into the skin 3 (three) times daily with meals.   oxybutynin 5 MG tablet Commonly known as: DITROPAN Take 5 mg by mouth daily as needed (To slow down urine).   pioglitazone 15 MG tablet Commonly known as: ACTOS Take 15 mg by mouth every morning.   tamsulosin 0.4 MG Caps capsule Commonly known as: FLOMAX Take 1 capsule (0.4 mg total) by mouth daily after breakfast. Start taking on: Dec 13, 2020   traMADol 50 MG tablet Commonly known as: ULTRAM Take 50 mg by mouth at bedtime as needed (restlessness).   traZODone 50 MG tablet Commonly known as: DESYREL Take 100 mg by mouth at bedtime as needed for sleep.   Vitamin B-12 5000 MCG Subl Place 5,000 mcg under the tongue daily.       Follow-up Information    [facility] Follow up.   Contact information: [place], [place] [address] [phone]             Allergies  Allergen Reactions  . Bee Venom Swelling and Anaphylaxis  . Metformin And Related Nausea And Vomiting  . Sulfamethoxazole-Trimethoprim Nausea And Vomiting  . Bee Pollen Other (See Comments)    Unknown   . Metformin Hcl Other (See Comments)    sick     Consultations:  None   Procedures/Studies: DG Shoulder Right  Result Date: 12/09/2020 CLINICAL DATA:  fall EXAM: RIGHT SHOULDER - 2+ VIEW COMPARISON:  None. FINDINGS: There is no evidence of fracture. There is mild glenohumeral and AC joint degenerative change. IMPRESSION: No evidence of acute fracture. Mild glenohumeral and AC joint degenerative change. Electronically Signed   By: Maurine Simmering   On: 12/09/2020 13:48   CT Head Wo  Contrast  Result Date: 12/09/2020 CLINICAL DATA:  Altered mental status. Unspecified minor head trauma. EXAM: CT HEAD WITHOUT CONTRAST TECHNIQUE: Contiguous axial images were obtained from the base of the skull through the vertex without intravenous contrast. COMPARISON:  11/29/2020 FINDINGS: Brain: No significant change in mild-to-moderate enlargement of the ventricles and subarachnoid spaces. Stable mild patchy white matter low density in both cerebral hemispheres. No intracranial hemorrhage, mass lesion or CT evidence of acute infarction. Vascular: No hyperdense vessel or unexpected calcification. Skull: Normal. Negative for fracture or focal lesion. Sinuses/Orbits: Status post bilateral cataract extraction. Mild bilateral frontal, ethmoid and maxillary sinus mucosal thickening. Other: None. IMPRESSION: 1. No acute abnormality. 2. Mild-to-moderate atrophy and mild chronic small vessel white matter ischemic changes in both cerebral hemispheres. Electronically Signed   By: Claudie Revering M.D.   On: 12/09/2020 12:27   CT Head Wo Contrast  Result Date: 11/30/2020 CLINICAL DATA:  Syncope, fall EXAM: CT HEAD WITHOUT CONTRAST TECHNIQUE: Contiguous axial images were obtained from the base of the skull through the vertex without intravenous contrast. COMPARISON:  None. FINDINGS: Brain: Normal anatomic configuration. Parenchymal volume loss is commensurate with the patient's age. Remote lacunar infarct noted within the a right basal ganglia. No abnormal intra or extra-axial mass lesion or fluid collection. No abnormal mass effect or midline shift. No evidence of acute intracranial hemorrhage or infarct. Ventricular size is normal. Cerebellum unremarkable. Vascular: No asymmetric hyperdense vasculature at the skull base. Skull: Intact Sinuses/Orbits: Paranasal sinuses are clear. Orbits are unremarkable. Other: Mastoid air cells and middle ear cavities are clear. IMPRESSION: No acute intracranial abnormality. Mild  senescent change. Remote right basal ganglia lacunar infarct. Electronically Signed   By: Fidela Salisbury MD   On: 11/30/2020 06:41   DG Chest Port 1 View  Result Date: 12/09/2020 CLINICAL DATA:  Possible sepsis. EXAM: PORTABLE CHEST 1 VIEW COMPARISON:  11/06/2020 FINDINGS: Cardiac enlargement and aortic atherosclerosis. Unchanged. Loop recorder is noted along the projection of the left heart border. Chronic coarsened interstitial markings are noted throughout both lungs. No signs of pleural effusion. No airspace consolidation identified. IMPRESSION: 1. No acute cardiopulmonary abnormalities 2. Cardiac enlargement Aortic Atherosclerosis (ICD10-I70.0). Electronically Signed   By: Kerby Moors M.D.   On: 12/09/2020 10:40   DG Shoulder Left  Result Date: 12/09/2020 CLINICAL DATA:  Fall several days ago.  Shoulder pain EXAM: LEFT SHOULDER - 2+ VIEW COMPARISON:  None. FINDINGS: Mild widening of the [facility] joint. No change from prior chest x-ray 07/28/2018 Negative for fracture.  Shoulder joint normal. IMPRESSION: Mild AC separation, chronic.  Negative for fracture Electronically Signed   By: Franchot Gallo M.D.   On: 12/09/2020 13:48   (Echo, Carotid, EGD, Colonoscopy, ERCP)    Subjective: Feels great no new complaints.  Discharge Exam: Vitals:   12/11/20 2100 12/12/20 0647  BP: (!) 116/59 (!) 125/49  Pulse: 75 (!) 57  Resp:  16  Temp: 98.4 F (36.9 C) 98.5 F (36.9 C)  SpO2: 98% 99%   Vitals:   12/11/20 0957 12/11/20 1800 12/11/20 2100 12/12/20 0647  BP: (!) 121/54 130/60 (!) 116/59 (!) 125/49  Pulse: 67 70 75 (!) 57  Resp: 16 17  16   Temp: 98 F (36.7 C) 98 F (36.7 C) 98.4 F (36.9 C) 98.5 F (36.9 C)  TempSrc: Oral Oral Oral Oral  SpO2: 96% 96% 98% 99%  Weight:      Height:        General: Pt is alert, awake, not in acute distress Cardiovascular: RRR, S1/S2 +, no rubs, no gallops Respiratory: CTA bilaterally, no wheezing, no rhonchi Abdominal: Soft, NT, ND, bowel sounds  + Extremities: no edema, no cyanosis    The results of significant diagnostics from this hospitalization (including imaging, microbiology, ancillary and laboratory) are listed below for reference.     Microbiology: Recent Results (from the past 240 hour(s))  Urine culture     Status: Abnormal   Collection Time: 12/09/20  9:53 AM   Specimen: In/Out Cath Urine  Result Value Ref Range Status   Specimen Description IN/OUT CATH URINE  Final   Special Requests NONE  Final   Culture (A)  Final    >=100,000 COLONIES/mL ESCHERICHIA COLI 50,000 COLONIES/mL ESCHERICHIA COLI Two isolates with different morphologies were identified as the same organism.The most resistant organism was reported. Performed at [facility], [place] [address],  [place], [place] [address]    Report Status 12/11/2020 FINAL  Final   Organism ID, Bacteria ESCHERICHIA COLI (A)  Final   Organism ID, Bacteria ESCHERICHIA COLI (A)  Final      Susceptibility   Escherichia coli - MIC*    AMPICILLIN >=32 RESISTANT Resistant     CEFAZOLIN 16 SENSITIVE Sensitive     CEFEPIME <=0.12 SENSITIVE Sensitive     CEFTRIAXONE <=0.25 SENSITIVE Sensitive     CIPROFLOXACIN <=0.25 SENSITIVE Sensitive     GENTAMICIN <=1 SENSITIVE Sensitive     IMIPENEM <=0.25 SENSITIVE Sensitive     NITROFURANTOIN <=16 SENSITIVE Sensitive     TRIMETH/SULFA 40 SENSITIVE Sensitive     AMPICILLIN/SULBACTAM >=32 RESISTANT Resistant     PIP/TAZO <=4 SENSITIVE Sensitive    Escherichia coli - MIC*    AMPICILLIN >=32 RESISTANT Resistant     CEFAZOLIN >=64 RESISTANT Resistant     CEFEPIME <=0.12 SENSITIVE Sensitive     CEFTRIAXONE <=0.25 SENSITIVE Sensitive     CIPROFLOXACIN <=0.25 SENSITIVE Sensitive     GENTAMICIN <=1 SENSITIVE Sensitive     IMIPENEM <=0.25 SENSITIVE Sensitive     NITROFURANTOIN <=16 SENSITIVE Sensitive     TRIMETH/SULFA 40 SENSITIVE Sensitive     AMPICILLIN/SULBACTAM >=32 RESISTANT Resistant     PIP/TAZO >=128 RESISTANT Resistant      * >=100,000 COLONIES/mL ESCHERICHIA COLI    50,000 COLONIES/mL ESCHERICHIA COLI  Resp Panel by RT-PCR (Flu A&B, Covid) Nasopharyngeal Swab     Status: None   Collection Time: 12/09/20 10:09 AM   Specimen: Nasopharyngeal Swab; Nasopharyngeal(NP) swabs in vial transport medium  Result Value Ref Range Status   SARS Coronavirus 2 by RT PCR NEGATIVE NEGATIVE Final    Comment: (NOTE) SARS-CoV-2 target nucleic acids are NOT DETECTED.  The SARS-CoV-2 RNA is generally detectable in upper respiratory specimens during the acute phase of infection. The lowest concentration of SARS-CoV-2 viral copies this assay can detect is 138 copies/mL. A negative result does not preclude SARS-Cov-2 infection and should not be used as the sole basis for treatment or other patient management decisions. A negative result may occur with  improper specimen collection/handling, submission of specimen other than nasopharyngeal swab, presence of viral mutation(s) within the areas targeted by this assay, and inadequate number of viral copies(<138 copies/mL). A negative result must be combined with clinical observations, patient history, and epidemiological information. The expected result is Negative.  Fact Sheet for Patients:  EntrepreneurPulse.com.au  Fact Sheet for Healthcare Providers:  IncredibleEmployment.be  This test is no t yet approved or cleared by the [place] FDA and  has been authorized for detection and/or diagnosis of SARS-CoV-2 by FDA under an Emergency Use Authorization (EUA). This EUA will remain  in effect (meaning this test can be used) for the duration of the COVID-19 declaration under Section 564(b)(1) of the Act, 21 U.S.C.section 360bbb-3(b)(1), unless the authorization is terminated  or revoked sooner.       Influenza A by PCR NEGATIVE NEGATIVE Final   Influenza B by PCR NEGATIVE NEGATIVE Final    Comment: (NOTE) The Xpert Xpress  SARS-CoV-2/FLU/RSV plus assay is intended as an aid in the diagnosis of influenza from Nasopharyngeal swab specimens and should not be used as a sole basis for treatment. Nasal washings and aspirates are unacceptable for Xpert Xpress SARS-CoV-2/FLU/RSV testing.  Fact Sheet for Patients: EntrepreneurPulse.com.au  Fact Sheet for Healthcare Providers: IncredibleEmployment.be  This test is not yet approved or cleared by the [place] FDA and has  been authorized for detection and/or diagnosis of SARS-CoV-2 by FDA under an Emergency Use Authorization (EUA). This EUA will remain in effect (meaning this test can be used) for the duration of the COVID-19 declaration under Section 564(b)(1) of the Act, 21 U.S.C. section 360bbb-3(b)(1), unless the authorization is terminated or revoked.  Performed at [facility], [place] [address] [place], [place] [address]   Blood Culture (routine x 2)     Status: None (Preliminary result)   Collection Time: 12/09/20 10:09 AM   Specimen: BLOOD LEFT FOREARM  Result Value Ref Range Status   Specimen Description BLOOD LEFT FOREARM  Final   Special Requests   Final    BOTTLES DRAWN AEROBIC AND ANAEROBIC Blood Culture results may not be optimal due to an inadequate volume of blood received in culture bottles   Culture   Final    NO GROWTH 3 DAYS Performed at [facility], [place] [address] [place], [place] [address]    Report Status PENDING  Incomplete  Blood Culture (routine x 2)     Status: None (Preliminary result)   Collection Time: 12/09/20 10:10 AM   Specimen: BLOOD LEFT ARM  Result Value Ref Range Status   Specimen Description BLOOD LEFT ARM  Final   Special Requests   Final    BOTTLES DRAWN AEROBIC AND ANAEROBIC Blood Culture adequate volume   Culture   Final    NO GROWTH 3 DAYS Performed at [facility], [place] [address] [place], [place] [address]    Report Status PENDING  Incomplete      Labs: BNP (last 3 results) Recent Labs    11/06/20 0450  BNP 182.9*   Basic Metabolic Panel: Recent Labs  Lab 12/09/20 1100 12/10/20 0432  NA 135 131*  K 3.8 3.9  CL 103 100  CO2 23 24  GLUCOSE 133* 227*  BUN 5* 6*  CREATININE 0.55* 0.53*  CALCIUM 7.7* 8.3*   Liver Function Tests: Recent Labs  Lab 12/09/20 1100  AST 24  ALT 28  ALKPHOS 82  BILITOT 0.8  PROT 5.1*  ALBUMIN 2.6*   No results for input(s): LIPASE, AMYLASE in the last 168 hours. No results for input(s): AMMONIA in the last 168 hours. CBC: Recent Labs  Lab 12/09/20 1100 12/09/20 1330 12/10/20 0432  WBC 13.9* 18.4* 15.8*  NEUTROABS 8.7* 11.8*  --   HGB 6.9* 7.9* 7.6*  HCT 20.0* 25.1* 22.7*  MCV 113.0* 111.6* 105.6*  PLT PLATELET CLUMPS NOTED ON SMEAR, COUNT APPEARS ADEQUATE 320 317   Cardiac Enzymes: No results for input(s): CKTOTAL, CKMB, CKMBINDEX, TROPONINI in the last 168 hours. BNP: Invalid input(s): POCBNP CBG: Recent Labs  Lab 12/10/20 2245 12/11/20 0748 12/11/20 1305 12/11/20 1640 12/11/20 2205  GLUCAP 230* 178* 170* 222* 241*   D-Dimer No results for input(s): DDIMER in the last 72 hours. Hgb A1c No results for input(s): HGBA1C in the last 72 hours. Lipid Profile No results for input(s): CHOL, HDL, LDLCALC, TRIG, CHOLHDL, LDLDIRECT in the last 72 hours. Thyroid function studies No results for input(s): TSH, T4TOTAL, T3FREE, THYROIDAB in the last 72 hours.  Invalid input(s): FREET3 Anemia work up No results for input(s): VITAMINB12, FOLATE, FERRITIN, TIBC, IRON, RETICCTPCT in the last 72 hours. Urinalysis    Component Value Date/Time   COLORURINE YELLOW 12/09/2020 1200   APPEARANCEUR CLOUDY (A) 12/09/2020 1200   LABSPEC 1.005 12/09/2020 1200   PHURINE 7.0 12/09/2020 1200   GLUCOSEU NEGATIVE 12/09/2020 1200  HGBUR LARGE (A) 12/09/2020 1200   BILIRUBINUR NEGATIVE 12/09/2020 1200   BILIRUBINUR negative 10/29/2017 1428   KETONESUR NEGATIVE 12/09/2020 1200    PROTEINUR NEGATIVE 12/09/2020 1200   UROBILINOGEN 0.2 10/29/2017 1428   NITRITE NEGATIVE 12/09/2020 1200   LEUKOCYTESUR LARGE (A) 12/09/2020 1200   Sepsis Labs Invalid input(s): PROCALCITONIN,  WBC,  LACTICIDVEN Microbiology Recent Results (from the past 240 hour(s))  Urine culture     Status: Abnormal   Collection Time: 12/09/20  9:53 AM   Specimen: In/Out Cath Urine  Result Value Ref Range Status   Specimen Description IN/OUT CATH URINE  Final   Special Requests NONE  Final   Culture (A)  Final    >=100,000 COLONIES/mL ESCHERICHIA COLI 50,000 COLONIES/mL ESCHERICHIA COLI Two isolates with different morphologies were identified as the same organism.The most resistant organism was reported. Performed at [facility], [place] [address] [place], [place] [address]    Report Status 12/11/2020 FINAL  Final   Organism ID, Bacteria ESCHERICHIA COLI (A)  Final   Organism ID, Bacteria ESCHERICHIA COLI (A)  Final      Susceptibility   Escherichia coli - MIC*    AMPICILLIN >=32 RESISTANT Resistant     CEFAZOLIN 16 SENSITIVE Sensitive     CEFEPIME <=0.12 SENSITIVE Sensitive     CEFTRIAXONE <=0.25 SENSITIVE Sensitive     CIPROFLOXACIN <=0.25 SENSITIVE Sensitive     GENTAMICIN <=1 SENSITIVE Sensitive     IMIPENEM <=0.25 SENSITIVE Sensitive     NITROFURANTOIN <=16 SENSITIVE Sensitive     TRIMETH/SULFA 40 SENSITIVE Sensitive     AMPICILLIN/SULBACTAM >=32 RESISTANT Resistant     PIP/TAZO <=4 SENSITIVE Sensitive    Escherichia coli - MIC*    AMPICILLIN >=32 RESISTANT Resistant     CEFAZOLIN >=64 RESISTANT Resistant     CEFEPIME <=0.12 SENSITIVE Sensitive     CEFTRIAXONE <=0.25 SENSITIVE Sensitive     CIPROFLOXACIN <=0.25 SENSITIVE Sensitive     GENTAMICIN <=1 SENSITIVE Sensitive     IMIPENEM <=0.25 SENSITIVE Sensitive     NITROFURANTOIN <=16 SENSITIVE Sensitive     TRIMETH/SULFA 40 SENSITIVE Sensitive     AMPICILLIN/SULBACTAM >=32 RESISTANT Resistant     PIP/TAZO >=128 RESISTANT  Resistant     * >=100,000 COLONIES/mL ESCHERICHIA COLI    50,000 COLONIES/mL ESCHERICHIA COLI  Resp Panel by RT-PCR (Flu A&B, Covid) Nasopharyngeal Swab     Status: None   Collection Time: 12/09/20 10:09 AM   Specimen: Nasopharyngeal Swab; Nasopharyngeal(NP) swabs in vial transport medium  Result Value Ref Range Status   SARS Coronavirus 2 by RT PCR NEGATIVE NEGATIVE Final    Comment: (NOTE) SARS-CoV-2 target nucleic acids are NOT DETECTED.  The SARS-CoV-2 RNA is generally detectable in upper respiratory specimens during the acute phase of infection. The lowest concentration of SARS-CoV-2 viral copies this assay can detect is 138 copies/mL. A negative result does not preclude SARS-Cov-2 infection and should not be used as the sole basis for treatment or other patient management decisions. A negative result may occur with  improper specimen collection/handling, submission of specimen other than nasopharyngeal swab, presence of viral mutation(s) within the areas targeted by this assay, and inadequate number of viral copies(<138 copies/mL). A negative result must be combined with clinical observations, patient history, and epidemiological information. The expected result is Negative.  Fact Sheet for Patients:  EntrepreneurPulse.com.au  Fact Sheet for Healthcare Providers:  IncredibleEmployment.be  This test is no t yet approved or cleared by the [place] FDA  and  has been authorized for detection and/or diagnosis of SARS-CoV-2 by FDA under an Emergency Use Authorization (EUA). This EUA will remain  in effect (meaning this test can be used) for the duration of the COVID-19 declaration under Section 564(b)(1) of the Act, 21 U.S.C.section 360bbb-3(b)(1), unless the authorization is terminated  or revoked sooner.       Influenza A by PCR NEGATIVE NEGATIVE Final   Influenza B by PCR NEGATIVE NEGATIVE Final    Comment: (NOTE) The Xpert Xpress  SARS-CoV-2/FLU/RSV plus assay is intended as an aid in the diagnosis of influenza from Nasopharyngeal swab specimens and should not be used as a sole basis for treatment. Nasal washings and aspirates are unacceptable for Xpert Xpress SARS-CoV-2/FLU/RSV testing.  Fact Sheet for Patients: EntrepreneurPulse.com.au  Fact Sheet for Healthcare Providers: IncredibleEmployment.be  This test is not yet approved or cleared by the [place] FDA and has been authorized for detection and/or diagnosis of SARS-CoV-2 by FDA under an Emergency Use Authorization (EUA). This EUA will remain in effect (meaning this test can be used) for the duration of the COVID-19 declaration under Section 564(b)(1) of the Act, 21 U.S.C. section 360bbb-3(b)(1), unless the authorization is terminated or revoked.  Performed at [facility], [place] [address], [place], [place] [address]   Blood Culture (routine x 2)     Status: None (Preliminary result)   Collection Time: 12/09/20 10:09 AM   Specimen: BLOOD LEFT FOREARM  Result Value Ref Range Status   Specimen Description BLOOD LEFT FOREARM  Final   Special Requests   Final    BOTTLES DRAWN AEROBIC AND ANAEROBIC Blood Culture results may not be optimal due to an inadequate volume of blood received in culture bottles   Culture   Final    NO GROWTH 3 DAYS Performed at [facility], [place] [address] [place], [place] [address]    Report Status PENDING  Incomplete  Blood Culture (routine x 2)     Status: None (Preliminary result)   Collection Time: 12/09/20 10:10 AM   Specimen: BLOOD LEFT ARM  Result Value Ref Range Status   Specimen Description BLOOD LEFT ARM  Final   Special Requests   Final    BOTTLES DRAWN AEROBIC AND ANAEROBIC Blood Culture adequate volume   Culture   Final    NO GROWTH 3 DAYS Performed at [facility], [place] [address] [place], [place] [address]    Report Status PENDING  Incomplete      Time coordinating discharge: Over 30 minutes  SIGNED:   Charlynne Cousins, MD  [facility] 12/12/2020, 11:52 AM Pager   If 7PM-7AM, please contact night-coverage [email or web address] Password [email or web address]

## 2020-12-12 NOTE — TOC Transition Note (Signed)
Transition of Care [facility]) - CM/SW Discharge Note   Patient Details  Name: Tommy Peterson MRN: [id number] Date of Birth: 1941/02/23  Transition of Care [facility]) CM/SW Contact:  Bartholomew Crews, RN Phone Number: [phone] 12/12/2020, 11:23 AM   Clinical Narrative:     Patient to transition home today. Spoke with wife on the phone. Advised of DC prescription sent to pharmacy. Left voicemail for Tim at [facility] to advise of discharge. No further TOC needs identified.   Final next level of care: Home/Self Care Barriers to Discharge: No Barriers Identified   Patient Goals and CMS Choice Patient states their goals for this hospitalization and ongoing recovery are:: return home with wife CMS Medicare.gov Compare Post Acute Care list provided to:: Patient Choice offered to / list presented to : [facility]  Discharge Placement                       Discharge Plan and Services In-house Referral: Hospice / Palliative Care Discharge Planning Services: CM Consult Post Acute Care Choice: Hospice          DME Arranged: N/A DME Agency: NA       HH Arranged: NA HH Agency: NA        Social Determinants of Health (SDOH) Interventions     Readmission Risk Interventions No flowsheet data found.

## 2020-12-14 DIAGNOSIS — Z79899 Other long term (current) drug therapy: Secondary | ICD-10-CM | POA: Diagnosis not present

## 2020-12-14 DIAGNOSIS — K52831 Collagenous colitis: Secondary | ICD-10-CM | POA: Diagnosis not present

## 2020-12-14 DIAGNOSIS — R3915 Urgency of urination: Secondary | ICD-10-CM | POA: Diagnosis not present

## 2020-12-14 DIAGNOSIS — R32 Unspecified urinary incontinence: Secondary | ICD-10-CM | POA: Diagnosis not present

## 2020-12-14 DIAGNOSIS — C9590 Leukemia, unspecified not having achieved remission: Secondary | ICD-10-CM | POA: Diagnosis not present

## 2020-12-14 LAB — CULTURE, BLOOD (ROUTINE X 2)
Culture: NO GROWTH
Culture: NO GROWTH
Special Requests: ADEQUATE

## 2020-12-21 DIAGNOSIS — E1122 Type 2 diabetes mellitus with diabetic chronic kidney disease: Secondary | ICD-10-CM | POA: Diagnosis not present

## 2020-12-21 DIAGNOSIS — E041 Nontoxic single thyroid nodule: Secondary | ICD-10-CM | POA: Diagnosis not present

## 2020-12-21 NOTE — Telephone Encounter (Signed)
The patient spoke with Dr. Sallyanne Kuster and requested to stop being monitored because of cost. He is being seen by Dr. Loletha Grayer every 3 months.

## 2020-12-22 NOTE — Progress Notes (Signed)
HEMATOLOGY/ONCOLOGY CLINIC NOTE  Date of Service: 12/23/2020  Patient Care Team: [name], DO as PCP - General (Family Medicine) Croitoru, Dani Gobble, MD as PCP - Cardiology (Cardiology) Collier Bullock, MD as Referring Physician (Hematology and Oncology) [name], DO as Referring Physician (Family Medicine)  CHIEF COMPLAINTS/PURPOSE OF CONSULTATION:  Large Granular Lymphocytic Leukemia  Hem/Onc History  1)- chronic leukocytosis (neutrophilia and absolute lymphocytosis), peripheral blood lymphocytes about 5000, about 1/4 of cells have LGL morphology 2)- macrocytic anemia at least since 09/08/2013 (normal CBC on 11/28/2012), MCV now as high as 115, low retics, marrow M:E ratio 15:1 in 05/2016, erythroid hypoplasia 3)- MGUS serum IgG lambda 0.4 g/dL in 07/2014, follow annually 4)- 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 weekly -03/29/2017 increase dose 25 mg po qweek -07/19/2017 increase mtx 30 mg po qweek, stable dose since then   HISTORY OF PRESENTING ILLNESS:  Tommy Peterson is a wonderful 80 y.o. male who has been referred to [place] by Dr [name] for evaluation and management of Large Granular Lymphocytic Leukemia. The pt reports that he is doing well overall.  Pt is here as he is moving his care because his previous Oncologist, Dr. [name], has changed practices. The pt reports that he needed 2 pints of blood every 2 weeks for a year beginning in 2014. Pt has never needed to any erythropoeitin  injections. His Hgb has been holding higher lately, around 10-11. It has been about 7 months since pt has needed his last blood transfusion. Pt has been taking 30 mg of Methotrexate once per week and denies any issues with that dosage. He has continued taking 1 mg Folic Acid once per day. He is also taking a daily multivitamin.   Pt was previously walking a lot but had to slow down due to fatigue. Pt will need a surgery to repair his left inguinal hernia.   In 2017 pt had a C. Diff infection. Pt had recurrent UTI infections in 2019. His last hospitalization for infections was in January 2020. There were no kidney stones or other reasons for repeat infections. Pt denies any pneumonia infections. Pt has had Diabetes for 40+ years and it is currently well controlled. Pt was never given a direct reason for chronic diarrhea but was placed on Budesonide which stopped his symptoms. Pt is currently taking 3 mg twice per week. He notes that he has been experiencing some urinary frequency, but does feel like he is completely emptying his bladder when he uses the restroom. Pt has seen a Urologist who gave him some medication that caused him to urinate up to 30 times per day. He has tried Flomax previously, and still has some at home, but notes that it does not help much. Pt is following with a Dermatologist , Dr. Rhona Raider, for his skin Squamous Cell Carcinoma. He is currently using Efudex to treat. He was having some night sweats about a year ago, but has had none recently.   Most recent lab results (08/28/2019) of CBC w/diff and CMP is as  follows: all values are WNL except for Hgb at 11.8, HCT at 35.1, MCV at 122, MCH at 41.1, RDW at 2.87, RDW at 16.0, Abs Immature Granulocyte at 0.11K, Immature Granulocyte Rel at 1.2, Mono Abs at 1.0K, Creatinine at 0.5, Glucose at 199.  On review of systems, pt reports constipation, fatigue and denies diarrhea, unexpected weight loss, fevers, chills, night sweats, abdominal  pain, leg swelling and any other symptoms.   On PMHx the pt reports BPH, Chronic Diarrhea, Chronic Large Granular Lymphocytic Leukemia, Collagenous Collitis, Clostridium defficile colitis, Chronic UTIs, Transfusion-dependant anemia, Type 2 diabetes mellitus.  INTERVAL HISTORY:   Tommy Peterson is a wonderful 80 y.o. male who is here for evaluation and management of Large Granular Lymphocytic Leukemia. The patient's last visit with [place] was on 09/21/2020. The pt reports that he is doing well overall.  The pt reports that seven or eight weeks ago his demands for blood have dropped greatly. The pt notes he has been in the hospital six times and recently got out in early May due to recurrent UTI and fevers. The pt is taking both Flomax and Oxybutynin currently. The pt notes that he has not been taking the Methotrexate after getting his acute UTI infections. He has not taken for a few months. The pt notes his cardiologist now has him taking Eliquis 5 mg BID for his afib for two months now. The pt notes he saw Dr. Koleen Distance in [facility] for his diarrhea, and they did not do anything but will f/u in four months. The pt notes he has currently gone five days now without a bowel movement. He notes the Flomax only helps him for the first part of the day, but he then frequently urinates the rest of the day. The pt notes he has been hospitalized six times this year, with four being for septic shock. His Urologist instructed him to urinate more once he believes he is finishes. He keeps getting recurrent UTI due to not completely emptying the bladder. The pt notes he has recently had four falls due to his BP dropping quickly and being lower. He notes some head injuries related to this. He is now standing up still for a minute prior to moving after getting up. The pt continues to take his B-Complex and Folic Acid.  The pt notes he is now receiving home healthcare two days weekly to help with promoting strength and  aid in walking.   Lab results today 12/23/2020 of CBC w/diff and CMP is as follows: all values are WNL except for WBC of 11.4K, RBC of 2.22, Hgb of 8.1, HCT of 24.8, MCV of 111.7, MCH of 36.5, RDW of 26.7, Lymph Abs of 5.5K, Monocytes Abs of 1.1K, Glucose of 306, Calcium of 8.7, Total protein of 6.0, Albumin of 3.2, ALT of 48.  On review of systems, pt reports frequent urination, recurrent infection issues, recent falls, dizziness, right leg swelling, right calf pain, and denies bleeding issues, blood in stools, nose bleeds, diarrhea, and any other symptoms.  SURGICAL HISTORY: Past Surgical History:  Procedure Laterality Date  . BONE MARROW BIOPSY Right 05-24-2016;  08-20-2014  . CARDIOVASCULAR STRESS TEST  11-26-2012   [name]  @ [place] [place]   normal nuclear study w/ no ischemia/  normal LV function and wall motion , ef 60% (find in care everywhere,epic)  . CARDIOVERSION N/A 08/31/2020   Procedure: CARDIOVERSION;  Surgeon: [name], MD;  Location: [facility];  Service: Cardiovascular;  Laterality:  N/A;  . CATARACT EXTRACTION W/ INTRAOCULAR LENS  IMPLANT, BILATERAL  2015  . CYSTOSCOPY WITH LITHOLAPAXY N/A 12/25/2017   Procedure: CYSTOSCOPY WITH LITHOLAPAXY AND FULGERATION;  Surgeon: [name], MD;  Location: [facility];  Service: Urology;  Laterality: N/A;  . INGUINAL HERNIA REPAIR Right 1980s  . LOOP RECORDER INSERTION N/A 04/10/2018   Procedure: LOOP RECORDER INSERTION;  Surgeon: [name], MD;  Location: [facility];  Service: Cardiovascular;  Laterality: N/A;  . PENILE PROSTHESIS IMPLANT  12-02-2015   dr [name] @ [facility] in [place]  . SHOULDER SURGERY Left 08/2016  . TEE WITHOUT CARDIOVERSION N/A 08/31/2020   Procedure: TRANSESOPHAGEAL ECHOCARDIOGRAM (TEE);  Surgeon: [name], MD;  Location: [facility];  Service: Cardiovascular;  Laterality: N/A;  . TONSILLECTOMY  child  . TRANSURETHRAL RESECTION OF  PROSTATE  2009   AND REPAIR RECURRENT RIGHT INGUINAL HERNIA    SOCIAL HISTORY: Social History   Socioeconomic History  . Marital status: Widowed    Spouse name: engaged  . Number of children: 8  . Years of education: college-2  . Highest education level: Not on file  Occupational History  . Occupation: Press photographer  . 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 Smoking status: Former Smoker    Years: 20.00    Types: Cigarettes    Quit date: 05/04/1996    Years since quitting: 24.6  . Smokeless tobacco: Never Used  Vaping Use  . Vaping Use: Never used  Substance and Sexual Activity  . Alcohol use: Yes    Comment: 1-2 glasses of wine every other day  . Drug use: No  . Sexual activity: Never  Other Topics Concern  . Not on file  Social History Narrative  . Not on file   Social Determinants of Health   Financial Resource Strain: Not on file  Food Insecurity: Not on file  Transportation Needs: Not on file  Physical Activity: Not on file  Stress: Not on file  Social Connections: Not on file  Intimate Partner Violence: Not on file    FAMILY HISTORY: Family History  Problem Relation Age of Onset  . Diabetes Mother   . Heart failure Mother     ALLERGIES:  is allergic to bee venom, metformin and related, sulfamethoxazole-trimethoprim, bee pollen, and metformin hcl.  MEDICATIONS:  Current Outpatient Medications  Medication Sig Dispense Refill  . amiodarone (PACERONE) 200 MG tablet Take 1 tablet (200 mg total) by mouth daily. 90 tablet 3  . apixaban (ELIQUIS) 5 MG TABS tablet Take 1 tablet (5 mg total) by mouth 2 (two) times daily. 60 tablet 11  . b complex vitamins capsule Take 1 capsule by mouth every evening.    . BD PEN NEEDLE NANO 2ND GEN 32G X 4 MM MISC     . budesonide (ENTOCORT EC) 3 MG 24 hr capsule Take 9 mg by mouth daily.    . Cranberry 500 MG TABS Take 500 mg by mouth daily.    . Cyanocobalamin (VITAMIN B-12) 5000 MCG SUBL Place 5,000 mcg under the tongue  daily.    . D-Mannose 500 MG CAPS Take 500 mg by mouth in the morning and at bedtime.    . folic acid (FOLVITE) 1 MG tablet TAKE 1 TABLET BY MOUTH EVERY DAY (Patient taking differently: Take 1 mg by mouth daily.) 90 tablet 3  . furosemide (LASIX) 20 MG tablet Take 20 mg by mouth daily.    . Insulin  Glargine-Lixisenatide 100-33 UNT-MCG/ML SOPN Inject 26 Units into the skin at bedtime. Soliqua 100/33    . methotrexate (RHEUMATREX) 2.5 MG tablet Take 30 mg by mouth once a week. Take every Tuesday. Caution: Chemotherapy. Protect from light.    . metoprolol tartrate (LOPRESSOR) 50 MG tablet Take 50 mg by mouth 2 (two) times daily.    Marland Kitchen NOVOLOG FLEXPEN 100 UNIT/ML FlexPen Inject 6 Units into the skin 3 (three) times daily with meals.    Marland Kitchen oxybutynin (DITROPAN) 5 MG tablet Take 5 mg by mouth daily as needed (To slow down urine).    . pioglitazone (ACTOS) 15 MG tablet Take 15 mg by mouth every morning.     . tamsulosin (FLOMAX) 0.4 MG CAPS capsule Take 1 capsule (0.4 mg total) by mouth daily after breakfast. 30 capsule 3  . traMADol (ULTRAM) 50 MG tablet Take 50 mg by mouth at bedtime as needed (restlessness).    . traZODone (DESYREL) 50 MG tablet Take 100 mg by mouth at bedtime as needed for sleep.     No current facility-administered medications for this visit.    REVIEW OF SYSTEMS:   10 Point review of Systems was done is negative except as noted above.  PHYSICAL EXAMINATION: ECOG PERFORMANCE STATUS: 1 - Symptomatic but completely ambulatory  Exam was given in a chair.  GENERAL:alert, in no acute distress and comfortable SKIN: no acute rashes, no significant lesions EYES: conjunctiva are pink and non-injected, sclera anicteric OROPHARYNX: MMM, no exudates, no oropharyngeal erythema or ulceration NECK: supple, no JVD LYMPH:  no palpable lymphadenopathy in the cervical, axillary or inguinal regions LUNGS: clear to auscultation b/l with normal respiratory effort HEART: regular rate &  rhythm ABDOMEN:  normoactive bowel sounds , non tender, not distended. Extremity: right pedal edema PSYCH: alert & oriented x 3 with fluent speech NEURO: no focal motor/sensory deficits   LABORATORY DATA:  I have reviewed the data as listed  CBC Latest Ref Rng & Units 12/23/2020 12/10/2020 12/09/2020  WBC 4.0 - 10.5 K/uL 11.4(H) 15.8(H) 18.4(H)  Hemoglobin 13.0 - 17.0 g/dL 8.1(L) 7.6(L) 7.9(L)  Hematocrit 39.0 - 52.0 % 24.8(L) 22.7(L) 25.1(L)  Platelets 150 - 400 K/uL 254 317 320    CMP Latest Ref Rng & Units 12/10/2020 12/09/2020 12/01/2020  Glucose 70 - 99 mg/dL 227(H) 133(H) 252(H)  BUN 8 - 23 mg/dL 6(L) 5(L) 8  Creatinine 0.61 - 1.24 mg/dL 0.53(L) 0.55(L) 0.66  Sodium 135 - 145 mmol/L 131(L) 135 137  Potassium 3.5 - 5.1 mmol/L 3.9 3.8 4.4  Chloride 98 - 111 mmol/L 100 103 102  CO2 22 - 32 mmol/L $RemoveB'24 23 26  'YZcDTGCB$ Calcium 8.9 - 10.3 mg/dL 8.3(L) 7.7(L) 8.5(L)  Total Protein 6.5 - 8.1 g/dL - 5.1(L) 6.2(L)  Total Bilirubin 0.3 - 1.2 mg/dL - 0.8 1.0  Alkaline Phos 38 - 126 U/L - 82 86  AST 15 - 41 U/L - 24 18  ALT 0 - 44 U/L - 28 27   09/28/2016 Flow Cytometry:   RADIOGRAPHIC STUDIES: I have personally reviewed the radiological images as listed and agreed with the findings in the report. DG Shoulder Right  Result Date: 12/09/2020 CLINICAL DATA:  fall EXAM: RIGHT SHOULDER - 2+ VIEW COMPARISON:  None. FINDINGS: There is no evidence of fracture. There is mild glenohumeral and AC joint degenerative change. IMPRESSION: No evidence of acute fracture. Mild glenohumeral and AC joint degenerative change. Electronically Signed   By: Maurine Simmering   On: 12/09/2020 13:48  CT Head Wo Contrast  Result Date: 12/09/2020 CLINICAL DATA:  Altered mental status. Unspecified minor head trauma. EXAM: CT HEAD WITHOUT CONTRAST TECHNIQUE: Contiguous axial images were obtained from the base of the skull through the vertex without intravenous contrast. COMPARISON:  11/29/2020 FINDINGS: Brain: No significant change in  mild-to-moderate enlargement of the ventricles and subarachnoid spaces. Stable mild patchy white matter low density in both cerebral hemispheres. No intracranial hemorrhage, mass lesion or CT evidence of acute infarction. Vascular: No hyperdense vessel or unexpected calcification. Skull: Normal. Negative for fracture or focal lesion. Sinuses/Orbits: Status post bilateral cataract extraction. Mild bilateral frontal, ethmoid and maxillary sinus mucosal thickening. Other: None. IMPRESSION: 1. No acute abnormality. 2. Mild-to-moderate atrophy and mild chronic small vessel white matter ischemic changes in both cerebral hemispheres. Electronically Signed   By: Claudie Revering M.D.   On: 12/09/2020 12:27   CT Head Wo Contrast  Result Date: 11/30/2020 CLINICAL DATA:  Syncope, fall EXAM: CT HEAD WITHOUT CONTRAST TECHNIQUE: Contiguous axial images were obtained from the base of the skull through the vertex without intravenous contrast. COMPARISON:  None. FINDINGS: Brain: Normal anatomic configuration. Parenchymal volume loss is commensurate with the patient's age. Remote lacunar infarct noted within the a right basal ganglia. No abnormal intra or extra-axial mass lesion or fluid collection. No abnormal mass effect or midline shift. No evidence of acute intracranial hemorrhage or infarct. Ventricular size is normal. Cerebellum unremarkable. Vascular: No asymmetric hyperdense vasculature at the skull base. Skull: Intact Sinuses/Orbits: Paranasal sinuses are clear. Orbits are unremarkable. Other: Mastoid air cells and middle ear cavities are clear. IMPRESSION: No acute intracranial abnormality. Mild senescent change. Remote right basal ganglia lacunar infarct. Electronically Signed   By: Fidela Salisbury MD   On: 11/30/2020 06:41   DG Chest Port 1 View  Result Date: 12/09/2020 CLINICAL DATA:  Possible sepsis. EXAM: PORTABLE CHEST 1 VIEW COMPARISON:  11/06/2020 FINDINGS: Cardiac enlargement and aortic atherosclerosis.  Unchanged. Loop recorder is noted along the projection of the left heart border. Chronic coarsened interstitial markings are noted throughout both lungs. No signs of pleural effusion. No airspace consolidation identified. IMPRESSION: 1. No acute cardiopulmonary abnormalities 2. Cardiac enlargement Aortic Atherosclerosis (ICD10-I70.0). Electronically Signed   By: Kerby Moors M.D.   On: 12/09/2020 10:40   DG Shoulder Left  Result Date: 12/09/2020 CLINICAL DATA:  Fall several days ago.  Shoulder pain EXAM: LEFT SHOULDER - 2+ VIEW COMPARISON:  None. FINDINGS: Mild widening of the [facility] joint. No change from prior chest x-ray 07/28/2018 Negative for fracture.  Shoulder joint normal. IMPRESSION: Mild AC separation, chronic.  Negative for fracture Electronically Signed   By: Franchot Gallo M.D.   On: 12/09/2020 13:48    ASSESSMENT & PLAN:   80 yo with   1) T cell large granular leukemia s/p transfusion dependent anemia - previous blood transfusion dependent and was last on MTX $Remo'30mg'hSKgQ$  po weekly a few months ago 2) IgG Lambda MGUS 3) recent COVID-19 infection  PLAN: -Discussed pt labwork today, 12/23/2020; Hgb stable, lymphocytes mildly elevated, sugars elevated.  -Recommended pt receive [place] Lower extremities due to right leg swelling and right calf pain. Pt will discuss this with his PCP. -Recommended pt use compression socks to alleviate right leg swelling. -Advised pt that either being off of Methotrexate and reduced inflammation could drive decreased transfusion needs. -Recommended pt use cane or walker when walking to help send signals to brain due to recent issues with falls. -[facility]  [facility].  -Recommended pt f/u w Endocrinologist for controlling blood sugars and insulin dosage. -Recommended pt f/u w PCP regarding uncontrolled blood sugars. -No lab or clinical evidence of Large Granular Lymphocytic Leukemia progression at this time. Will continue watchful  observation. -Will continue to hold Methotrexate at this time due to frequent infection issues. -Advised pt to discuss starting preventive antibiotics due to frequent infections. -Continue 2 mg Folic acid, Vitamin Z56, and B-complex daily. -Recommended pt discuss management of HR and BP with Cardiology due to being low. -Continue 2 units PRBC every 2 weeks as needed with labs. -Will see back in 3 months with labs.   FOLLOW UP: Plz schedule for labs and 2 units of PRBC every 2 weeks x6 RTC with Dr Irene Limbo in 3 months   The total time spent in the appointment was 40 minutes and more than 50% was on counseling and direct patient cares.  All of the patient's questions were answered with apparent satisfaction. The patient knows to call the clinic with any problems, questions or concerns.   Sullivan Lone MD [place] AAHIVMS [facility] Hematology/Oncology Physician [facility]  (Office):       [phone] (Work cell):  [phone] (Fax):           [phone]  12/23/2020 9:28 AM  I, Reinaldo Raddle, am acting as scribe for Dr. Sullivan Lone, MD.      .I have reviewed the above documentation for accuracy and completeness, and I agree with the above. Brunetta Genera MD

## 2020-12-23 ENCOUNTER — Inpatient Hospital Stay: Payer: Medicare HMO

## 2020-12-23 ENCOUNTER — Other Ambulatory Visit: Payer: Self-pay

## 2020-12-23 ENCOUNTER — Inpatient Hospital Stay: Payer: Medicare HMO | Attending: Hematology | Admitting: Hematology

## 2020-12-23 VITALS — BP 116/51 | HR 52 | Temp 97.4°F | Resp 18 | Ht 70.0 in | Wt 162.1 lb

## 2020-12-23 DIAGNOSIS — D649 Anemia, unspecified: Secondary | ICD-10-CM | POA: Diagnosis not present

## 2020-12-23 DIAGNOSIS — C91Z0 Other lymphoid leukemia not having achieved remission: Secondary | ICD-10-CM | POA: Diagnosis not present

## 2020-12-23 DIAGNOSIS — Z8616 Personal history of COVID-19: Secondary | ICD-10-CM | POA: Insufficient documentation

## 2020-12-23 DIAGNOSIS — D472 Monoclonal gammopathy: Secondary | ICD-10-CM | POA: Diagnosis not present

## 2020-12-23 LAB — CBC WITH DIFFERENTIAL/PLATELET
Abs Immature Granulocytes: 0.25 10*3/uL — ABNORMAL HIGH (ref 0.00–0.07)
Basophils Absolute: 0 10*3/uL (ref 0.0–0.1)
Basophils Relative: 0 %
Eosinophils Absolute: 0 10*3/uL (ref 0.0–0.5)
Eosinophils Relative: 0 %
HCT: 24.8 % — ABNORMAL LOW (ref 39.0–52.0)
Hemoglobin: 8.1 g/dL — ABNORMAL LOW (ref 13.0–17.0)
Immature Granulocytes: 2 %
Lymphocytes Relative: 49 %
Lymphs Abs: 5.5 10*3/uL — ABNORMAL HIGH (ref 0.7–4.0)
MCH: 36.5 pg — ABNORMAL HIGH (ref 26.0–34.0)
MCHC: 32.7 g/dL (ref 30.0–36.0)
MCV: 111.7 fL — ABNORMAL HIGH (ref 80.0–100.0)
Monocytes Absolute: 1.1 10*3/uL — ABNORMAL HIGH (ref 0.1–1.0)
Monocytes Relative: 10 %
Neutro Abs: 4.5 10*3/uL (ref 1.7–7.7)
Neutrophils Relative %: 39 %
Platelets: 254 10*3/uL (ref 150–400)
RBC: 2.22 MIL/uL — ABNORMAL LOW (ref 4.22–5.81)
RDW: 26.7 % — ABNORMAL HIGH (ref 11.5–15.5)
WBC: 11.4 10*3/uL — ABNORMAL HIGH (ref 4.0–10.5)
nRBC: 0 % (ref 0.0–0.2)

## 2020-12-23 LAB — CMP (CANCER CENTER ONLY)
ALT: 48 U/L — ABNORMAL HIGH (ref 0–44)
AST: 21 U/L (ref 15–41)
Albumin: 3.2 g/dL — ABNORMAL LOW (ref 3.5–5.0)
Alkaline Phosphatase: 89 U/L (ref 38–126)
Anion gap: 7 (ref 5–15)
BUN: 11 mg/dL (ref 8–23)
CO2: 29 mmol/L (ref 22–32)
Calcium: 8.7 mg/dL — ABNORMAL LOW (ref 8.9–10.3)
Chloride: 100 mmol/L (ref 98–111)
Creatinine: 0.71 mg/dL (ref 0.61–1.24)
GFR, Estimated: >60 mL/min (ref >60)
Glucose, Bld: 306 mg/dL — ABNORMAL HIGH (ref 70–99)
Potassium: 3.8 mmol/L (ref 3.5–5.1)
Sodium: 136 mmol/L (ref 135–145)
Total Bilirubin: 1.1 mg/dL (ref 0.3–1.2)
Total Protein: 6 g/dL — ABNORMAL LOW (ref 6.5–8.1)

## 2020-12-23 LAB — SAMPLE TO BLOOD BANK

## 2020-12-23 NOTE — Patient Instructions (Signed)
Thank you for choosing [facility] to provide your oncology and hematology care.  To afford each patient quality time with our providers, please arrive 30 minutes before your scheduled appointment time.  If you arrive late for your appointment, you may be asked to reschedule.  We strive to give you quality time with our providers, and arriving late affects you and other patients whose appointments are after yours.   If you are a no show for multiple scheduled visits, you may be dismissed from the clinic at the providers discretion.    Again, thank you for choosing [facility], our hope is that these requests will decrease the amount of time that you wait before being seen by our physicians.  ______________________________________________________________________  Should you have questions after your visit to the [facility], please contact our office at [phone] between the hours of 8:30 and 4:30 p.m.    Voicemails left after 4:30p.m will not be returned until the following business day.    For prescription refill requests, please have your pharmacy contact us directly.  Please also try to allow 48 hours for prescription requests.    Please contact the scheduling department for questions regarding scheduling.  For scheduling of procedures such as PET scans, CT scans, MRI, Ultrasound, etc please contact central scheduling at [phone].    Resources For Cancer Patients and Caregivers:   Oncolink.org:  A wonderful resource for patients and healthcare providers for information regarding your disease, ways to tract your treatment, what to expect, etc.     [place]:  5871573087  Can help patients locate various types of support and financial assistance  Cancer Care: 1-800-813-HOPE 480-743-1618) Provides financial assistance, online support groups, medication/co-pay assistance.    [place]:  (646)538-3212 Where to apply for food  stamps, Medicaid, and utility assistance  Medicare Rights Center: 918-837-9070 Helps people with Medicare understand their rights and benefits, navigate the Medicare system, and secure the quality healthcare they deserve  [employer]: [employer] [employer]'s shared-ride transportation service for eligible riders who have a disability that prevents them from riding the fixed route bus.

## 2020-12-24 ENCOUNTER — Other Ambulatory Visit: Payer: Self-pay | Admitting: Hematology

## 2020-12-27 ENCOUNTER — Telehealth: Payer: Self-pay | Admitting: Hematology

## 2020-12-27 NOTE — Telephone Encounter (Signed)
Scheduled follow-up appointments per 5/19 los. Patient is aware. 

## 2020-12-28 ENCOUNTER — Telehealth: Payer: Self-pay | Admitting: Cardiovascular Disease

## 2020-12-28 NOTE — Telephone Encounter (Signed)
   [facility] Pre-operative Risk Assessment    Patient Name: RENZO VINCELETTE  DOB: 12-24-1940  MRN: [id number]   HEARTCARE STAFF: - Please ensure there is not already an duplicate clearance open for this procedure. - Under Visit Info/Reason for Call, type in Other and utilize the format Clearance MM/DD/YY or Clearance TBD. Do not use dashes or single digits. - If request is for dental extraction, please clarify the # of teeth to be extracted.  Request for surgical clearance:  What type of surgery is being performed? Penile prosthesis possible removal   1. When is this surgery scheduled? No date yet   2. What type of clearance is required (medical clearance vs. Pharmacy clearance to hold med vs. Both)? Both   3. Are there any medications that need to be held prior to surgery and how long? Not sure   4. Practice name and name of physician performing surgery? Dr. [facility]   5. What is the office phone number? [phone]   7.   What is the office fax number? [phone] attention Ma  8.   Anesthesia type (None, local, MAC, general) ? Not sure   Shon Millet 12/28/2020, 12:17 PM  _________________________________________________________________   (provider comments below)

## 2020-12-28 NOTE — Telephone Encounter (Addendum)
   Name: Tommy Peterson DOB: April 17, 1941  MRN: [id number]  Primary Cardiologist: [name], MD  Chart reviewed as part of pre-operative protocol coverage.   80 y.o. male with . Chronic Lymphocytic Leukemia  . Insulin Dependent Diabetes Mellitus   . Hypertension  . Paroxysmal atrial fibrillation  o Amiodarone Rx . Paroxysmal A Tach  . S/p ILR  . Celiac dz  . Hx of CVA  . Hx of syncope 4/22 >> ILR w/o sig arrhythmia (likely vasovagal) . Coronary Ca2+ on CT . Echo 4/22: Normal EF, mild-moderate MR, mild-moderate AV sclerosis without stenosis  Last OV:  12/02/20 with Dr. Sallyanne Kuster Procedure:  Penile prosthesis removal  Rx:  No hold requested (but pt on chronic anticoagulation with Apixaban)  RCRI:  Perioperative Risk of Major Cardiac Event is (%): 6.6 (high risk)  Will route to Pharmacy clinic for recommendations re: holding anticoagulation.   Please call with questions. Richardson Dopp, PA-C 12/28/2020, 5:36 PM

## 2020-12-29 NOTE — Telephone Encounter (Signed)
Patient with diagnosis of afib on Eliquis for anticoagulation.    Procedure: Penile prosthesis possible removal  Date of procedure: TBD  CHA2DS2-VASc Score = 7  This indicates a 11.2% annual risk of stroke. The patient's score is based upon: CHF History: No HTN History: Yes Diabetes History: Yes Stroke History: Yes Vascular Disease History: Yes Age Score: 2 Gender Score: 0     CrCl 85.6 ml/min Platelet count 254  Per office protocol, patient can hold Eliquis for 1 day prior to procedure.    If longer hold is needed, MD approval will be needed.

## 2020-12-29 NOTE — Telephone Encounter (Signed)
Left message for [facility] with surgeon's office, cardiologist and pharm-d want to know if surgeon is ok with holding Eliquis x 1 day. Left message to call back [phone] and ask to s/w pre op team.

## 2020-12-29 NOTE — Telephone Encounter (Signed)
Please contact surgeon's office and ask if they are ok with holding Eliquis for 1 day prior to surgery.  Richardson Dopp, PA-C    12/29/2020 3:37 PM

## 2020-12-30 ENCOUNTER — Emergency Department (HOSPITAL_COMMUNITY): Payer: No Typology Code available for payment source

## 2020-12-30 ENCOUNTER — Inpatient Hospital Stay (HOSPITAL_COMMUNITY)
Admission: EM | Admit: 2020-12-30 | Discharge: 2021-01-03 | DRG: 871 | Disposition: A | Discharge status: Home Health | Payer: No Typology Code available for payment source | Attending: Family Medicine | Admitting: Family Medicine

## 2020-12-30 ENCOUNTER — Encounter (HOSPITAL_COMMUNITY): Payer: Self-pay | Admitting: Emergency Medicine

## 2020-12-30 ENCOUNTER — Observation Stay (HOSPITAL_COMMUNITY): Payer: No Typology Code available for payment source

## 2020-12-30 DIAGNOSIS — E1165 Type 2 diabetes mellitus with hyperglycemia: Secondary | ICD-10-CM | POA: Diagnosis present

## 2020-12-30 DIAGNOSIS — Z9689 Presence of other specified functional implants: Secondary | ICD-10-CM | POA: Diagnosis present

## 2020-12-30 DIAGNOSIS — G9341 Metabolic encephalopathy: Secondary | ICD-10-CM | POA: Diagnosis present

## 2020-12-30 DIAGNOSIS — E860 Dehydration: Secondary | ICD-10-CM | POA: Diagnosis present

## 2020-12-30 DIAGNOSIS — Z8249 Family history of ischemic heart disease and other diseases of the circulatory system: Secondary | ICD-10-CM

## 2020-12-30 DIAGNOSIS — D472 Monoclonal gammopathy: Secondary | ICD-10-CM | POA: Diagnosis present

## 2020-12-30 DIAGNOSIS — Z85828 Personal history of other malignant neoplasm of skin: Secondary | ICD-10-CM

## 2020-12-30 DIAGNOSIS — E119 Type 2 diabetes mellitus without complications: Secondary | ICD-10-CM

## 2020-12-30 DIAGNOSIS — N400 Enlarged prostate without lower urinary tract symptoms: Secondary | ICD-10-CM | POA: Diagnosis not present

## 2020-12-30 DIAGNOSIS — Z882 Allergy status to sulfonamides status: Secondary | ICD-10-CM

## 2020-12-30 DIAGNOSIS — I10 Essential (primary) hypertension: Secondary | ICD-10-CM | POA: Diagnosis present

## 2020-12-30 DIAGNOSIS — R000 Tachycardia, unspecified: Secondary | ICD-10-CM | POA: Diagnosis present

## 2020-12-30 DIAGNOSIS — X58XXXA Exposure to other specified factors, initial encounter: Secondary | ICD-10-CM | POA: Diagnosis present

## 2020-12-30 DIAGNOSIS — Z833 Family history of diabetes mellitus: Secondary | ICD-10-CM

## 2020-12-30 DIAGNOSIS — Z79899 Other long term (current) drug therapy: Secondary | ICD-10-CM

## 2020-12-30 DIAGNOSIS — R6520 Severe sepsis without septic shock: Secondary | ICD-10-CM | POA: Diagnosis present

## 2020-12-30 DIAGNOSIS — I110 Hypertensive heart disease with heart failure: Secondary | ICD-10-CM | POA: Diagnosis present

## 2020-12-30 DIAGNOSIS — N5089 Other specified disorders of the male genital organs: Secondary | ICD-10-CM | POA: Diagnosis present

## 2020-12-30 DIAGNOSIS — R296 Repeated falls: Secondary | ICD-10-CM | POA: Diagnosis present

## 2020-12-30 DIAGNOSIS — R4701 Aphasia: Secondary | ICD-10-CM | POA: Diagnosis not present

## 2020-12-30 DIAGNOSIS — N433 Hydrocele, unspecified: Secondary | ICD-10-CM | POA: Diagnosis present

## 2020-12-30 DIAGNOSIS — E871 Hypo-osmolality and hyponatremia: Secondary | ICD-10-CM | POA: Diagnosis present

## 2020-12-30 DIAGNOSIS — Z7901 Long term (current) use of anticoagulants: Secondary | ICD-10-CM | POA: Diagnosis not present

## 2020-12-30 DIAGNOSIS — A4151 Sepsis due to Escherichia coli [E. coli]: Secondary | ICD-10-CM | POA: Diagnosis not present

## 2020-12-30 DIAGNOSIS — K529 Noninfective gastroenteritis and colitis, unspecified: Secondary | ICD-10-CM | POA: Diagnosis present

## 2020-12-30 DIAGNOSIS — Z888 Allergy status to other drugs, medicaments and biological substances status: Secondary | ICD-10-CM

## 2020-12-30 DIAGNOSIS — Z87440 Personal history of urinary (tract) infections: Secondary | ICD-10-CM

## 2020-12-30 DIAGNOSIS — I5033 Acute on chronic diastolic (congestive) heart failure: Secondary | ICD-10-CM | POA: Diagnosis not present

## 2020-12-30 DIAGNOSIS — S0990XD Unspecified injury of head, subsequent encounter: Secondary | ICD-10-CM

## 2020-12-30 DIAGNOSIS — Z91030 Bee allergy status: Secondary | ICD-10-CM

## 2020-12-30 DIAGNOSIS — D539 Nutritional anemia, unspecified: Secondary | ICD-10-CM | POA: Diagnosis present

## 2020-12-30 DIAGNOSIS — W19XXXA Unspecified fall, initial encounter: Secondary | ICD-10-CM | POA: Diagnosis present

## 2020-12-30 DIAGNOSIS — Z794 Long term (current) use of insulin: Secondary | ICD-10-CM

## 2020-12-30 DIAGNOSIS — S0990XA Unspecified injury of head, initial encounter: Secondary | ICD-10-CM | POA: Diagnosis present

## 2020-12-30 DIAGNOSIS — Z87891 Personal history of nicotine dependence: Secondary | ICD-10-CM

## 2020-12-30 DIAGNOSIS — Z20822 Contact with and (suspected) exposure to covid-19: Secondary | ICD-10-CM | POA: Diagnosis present

## 2020-12-30 DIAGNOSIS — A419 Sepsis, unspecified organism: Secondary | ICD-10-CM | POA: Diagnosis not present

## 2020-12-30 DIAGNOSIS — R52 Pain, unspecified: Secondary | ICD-10-CM

## 2020-12-30 DIAGNOSIS — N3000 Acute cystitis without hematuria: Secondary | ICD-10-CM

## 2020-12-30 DIAGNOSIS — C91Z0 Other lymphoid leukemia not having achieved remission: Secondary | ICD-10-CM | POA: Diagnosis present

## 2020-12-30 DIAGNOSIS — N451 Epididymitis: Secondary | ICD-10-CM | POA: Diagnosis present

## 2020-12-30 DIAGNOSIS — K52831 Collagenous colitis: Secondary | ICD-10-CM | POA: Diagnosis present

## 2020-12-30 DIAGNOSIS — I480 Paroxysmal atrial fibrillation: Secondary | ICD-10-CM | POA: Diagnosis present

## 2020-12-30 DIAGNOSIS — N390 Urinary tract infection, site not specified: Secondary | ICD-10-CM | POA: Diagnosis present

## 2020-12-30 LAB — URINALYSIS, ROUTINE W REFLEX MICROSCOPIC
Bilirubin Urine: NEGATIVE
Glucose, UA: >=500 mg/dL — AB
Hgb urine dipstick: NEGATIVE
Ketones, ur: 20 mg/dL — AB
Nitrite: POSITIVE — AB
Protein, ur: NEGATIVE mg/dL
Specific Gravity, Urine: 1.02 (ref 1.005–1.030)
WBC, UA: >50 WBC/hpf — ABNORMAL HIGH (ref 0–5)
pH: 8 (ref 5.0–8.0)

## 2020-12-30 LAB — CBC WITH DIFFERENTIAL/PLATELET
Abs Immature Granulocytes: 0.41 10*3/uL — ABNORMAL HIGH (ref 0.00–0.07)
Basophils Absolute: 0 10*3/uL (ref 0.0–0.1)
Basophils Relative: 0 %
Eosinophils Absolute: 0 10*3/uL (ref 0.0–0.5)
Eosinophils Relative: 0 %
HCT: 26.5 % — ABNORMAL LOW (ref 39.0–52.0)
Hemoglobin: 9 g/dL — ABNORMAL LOW (ref 13.0–17.0)
Immature Granulocytes: 2 %
Lymphocytes Relative: 30 %
Lymphs Abs: 5.4 10*3/uL — ABNORMAL HIGH (ref 0.7–4.0)
MCH: 39.5 pg — ABNORMAL HIGH (ref 26.0–34.0)
MCHC: 34 g/dL (ref 30.0–36.0)
MCV: 116.2 fL — ABNORMAL HIGH (ref 80.0–100.0)
Monocytes Absolute: 1.5 10*3/uL — ABNORMAL HIGH (ref 0.1–1.0)
Monocytes Relative: 9 %
Neutro Abs: 10.7 10*3/uL — ABNORMAL HIGH (ref 1.7–7.7)
Neutrophils Relative %: 59 %
Platelets: 244 10*3/uL (ref 150–400)
RBC: 2.28 MIL/uL — ABNORMAL LOW (ref 4.22–5.81)
WBC: 18 10*3/uL — ABNORMAL HIGH (ref 4.0–10.5)
nRBC: 0.1 % (ref 0.0–0.2)

## 2020-12-30 LAB — COMPREHENSIVE METABOLIC PANEL
ALT: 36 U/L (ref 0–44)
AST: 19 U/L (ref 15–41)
Albumin: 3.7 g/dL (ref 3.5–5.0)
Alkaline Phosphatase: 84 U/L (ref 38–126)
Anion gap: 11 (ref 5–15)
BUN: 14 mg/dL (ref 8–23)
CO2: 27 mmol/L (ref 22–32)
Calcium: 9.1 mg/dL (ref 8.9–10.3)
Chloride: 93 mmol/L — ABNORMAL LOW (ref 98–111)
Creatinine, Ser: 0.58 mg/dL — ABNORMAL LOW (ref 0.61–1.24)
GFR, Estimated: >60 mL/min (ref >60)
Glucose, Bld: 324 mg/dL — ABNORMAL HIGH (ref 70–99)
Potassium: 4.3 mmol/L (ref 3.5–5.1)
Sodium: 131 mmol/L — ABNORMAL LOW (ref 135–145)
Total Bilirubin: 1.2 mg/dL (ref 0.3–1.2)
Total Protein: 7.5 g/dL (ref 6.5–8.1)

## 2020-12-30 LAB — RESP PANEL BY RT-PCR (FLU A&B, COVID) ARPGX2
Influenza A by PCR: NEGATIVE
Influenza B by PCR: NEGATIVE
SARS Coronavirus 2 by RT PCR: NEGATIVE

## 2020-12-30 LAB — OSMOLALITY, URINE: Osmolality, Ur: 598 mOsm/kg (ref 300–900)

## 2020-12-30 LAB — LACTIC ACID, PLASMA
Lactic Acid, Venous: 3.2 mmol/L (ref 0.5–1.9)
Lactic Acid, Venous: 1.5 mmol/L (ref 0.5–1.9)
Lactic Acid, Venous: 1.4 mmol/L (ref 0.5–1.9)

## 2020-12-30 LAB — GLUCOSE, CAPILLARY: Glucose-Capillary: 322 mg/dL — ABNORMAL HIGH (ref 70–99)

## 2020-12-30 LAB — MAGNESIUM: Magnesium: 1.9 mg/dL (ref 1.7–2.4)

## 2020-12-30 LAB — SODIUM, URINE, RANDOM: Sodium, Ur: 87 mmol/L

## 2020-12-30 LAB — CREATININE, URINE, RANDOM: Creatinine, Urine: 56.71 mg/dL

## 2020-12-30 LAB — OSMOLALITY: Osmolality: 292 mOsm/kg (ref 275–295)

## 2020-12-30 LAB — AMMONIA: Ammonia: 32 umol/L (ref 9–35)

## 2020-12-30 LAB — PHOSPHORUS: Phosphorus: 2.8 mg/dL (ref 2.5–4.6)

## 2020-12-30 LAB — CK: Total CK: 20 U/L — ABNORMAL LOW (ref 49–397)

## 2020-12-30 IMAGING — CT CT HEAD W/O CM
3 of 5 series · 14 of 47 positions shown, 16 images · non-contrast
Comparison: Head CT [DATE]

CLINICAL DATA: Head trauma.

EXAM:
CT HEAD WITHOUT CONTRAST
TECHNIQUE: Contiguous axial images were obtained from the base of the skull
through the vertex without intravenous contrast.

[Series 4: coronal soft tissue · coronal · 0.36mm/px · 3 of 75 slices shown]
[im 25/75  brain]
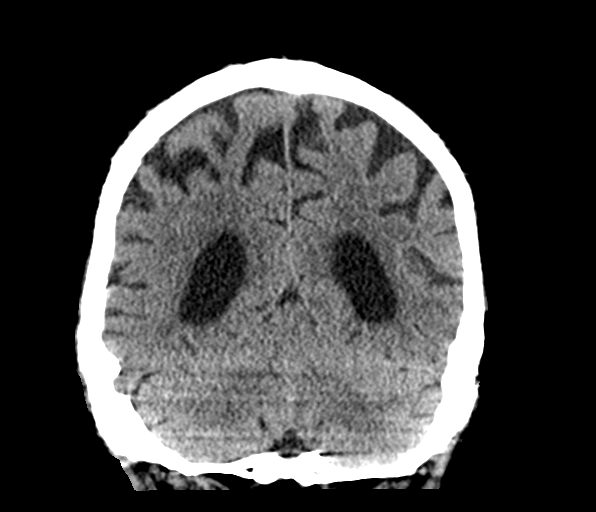
[im 33/75  brain]
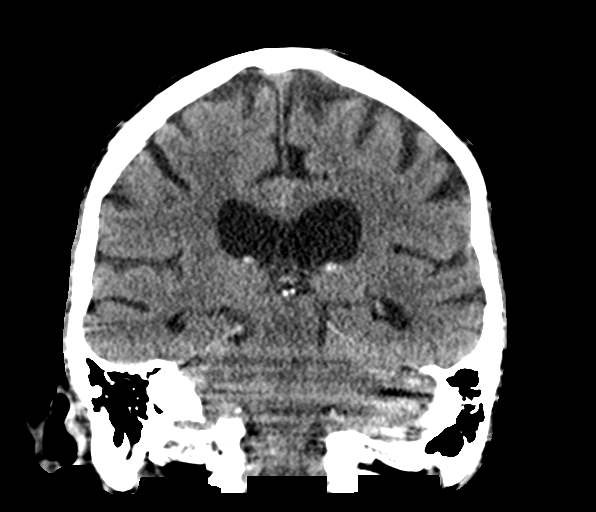
[im 42/75  brain]
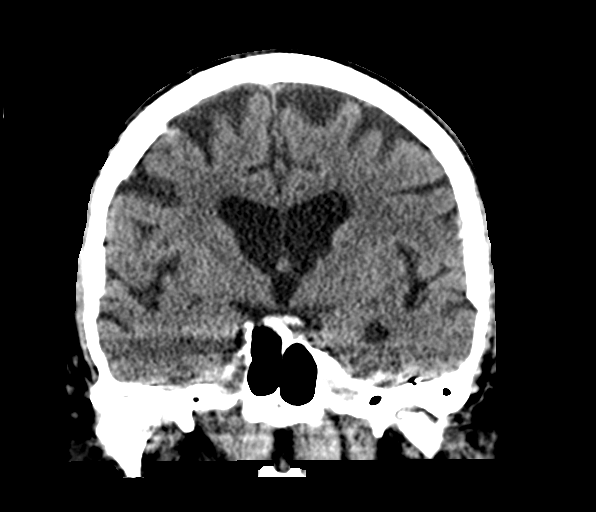

[Series 5: sagittal soft tissue · sagittal · 0.34mm/px · 3 of 63 slices shown]
[im 21/63  brain]
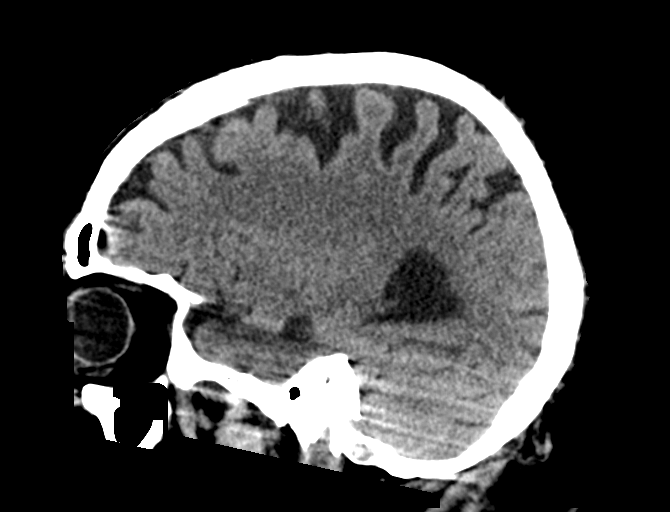
[im 32/63  brain]
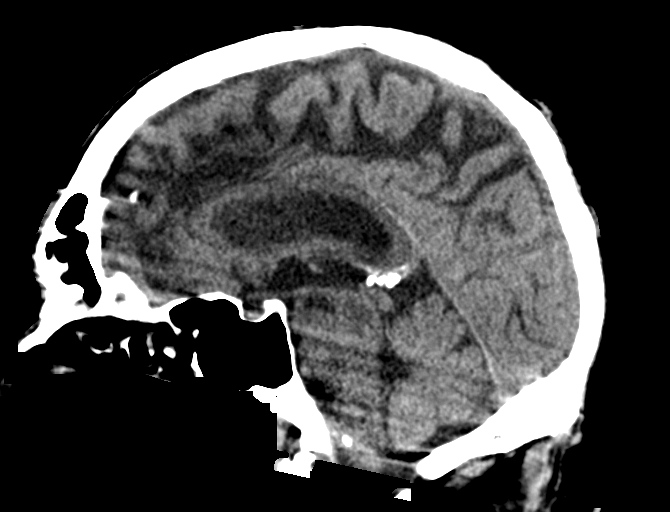
[im 42/63  brain]
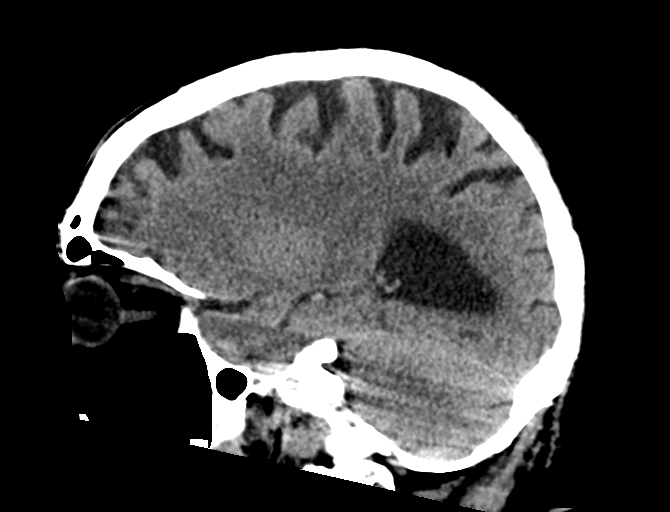

[Series 7: axial soft tissue · axial · 0.41mm/px · z∈[-205,-58]mm · 8 of 61 slices shown, 10 images]
[im 6/61  brain]
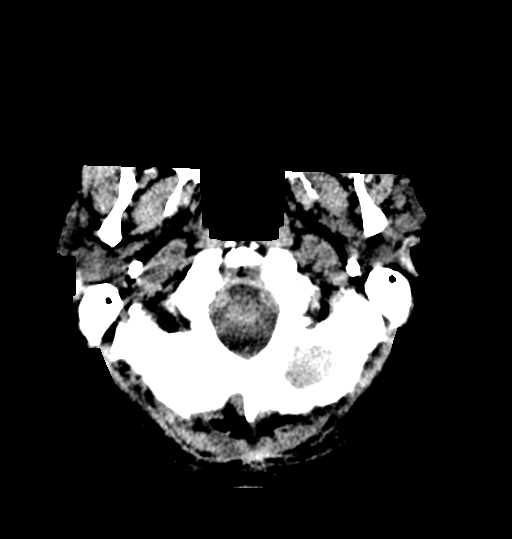
[im 6/61  bone]
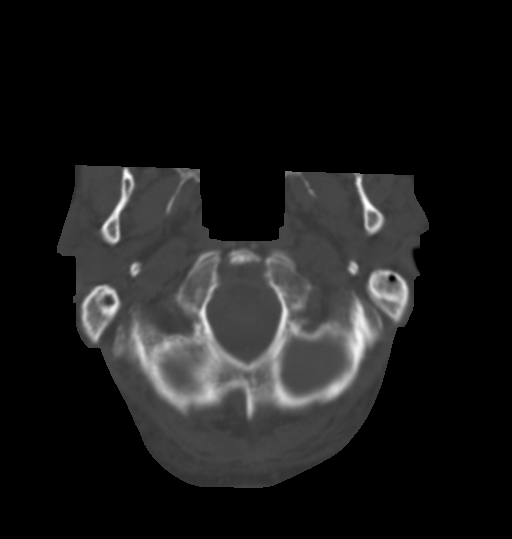
[im 11/61  brain]
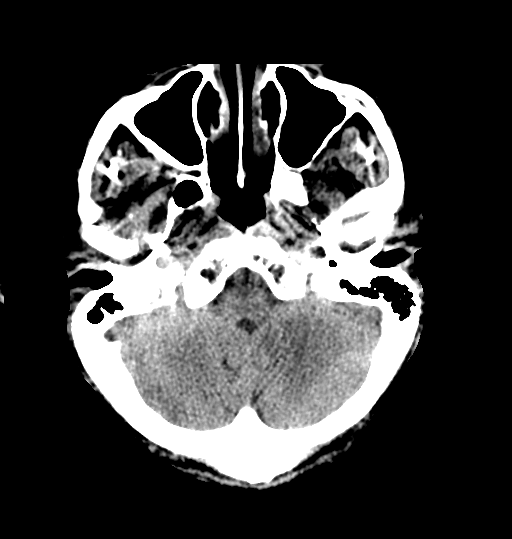
[im 22/61  brain]
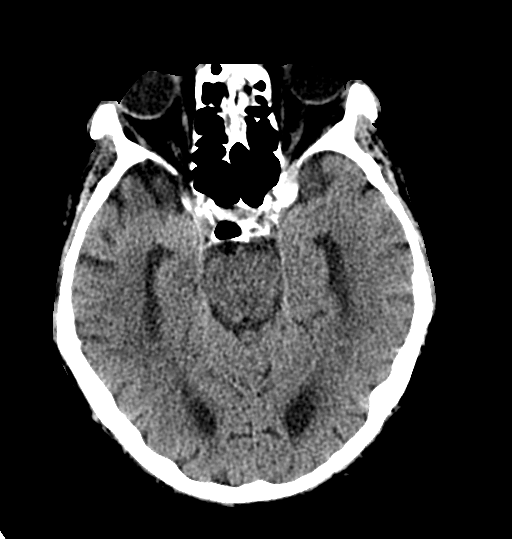
[im 28/61  brain]
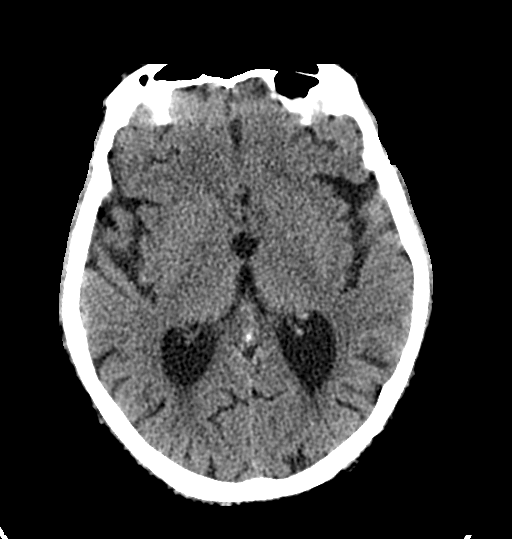
[im 33/61  brain]
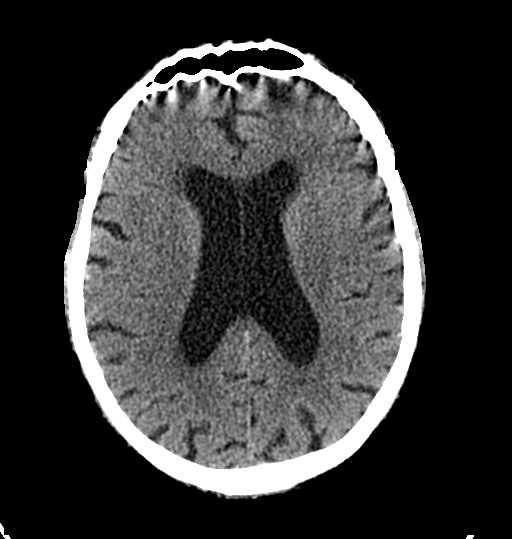
[im 33/61  bone]
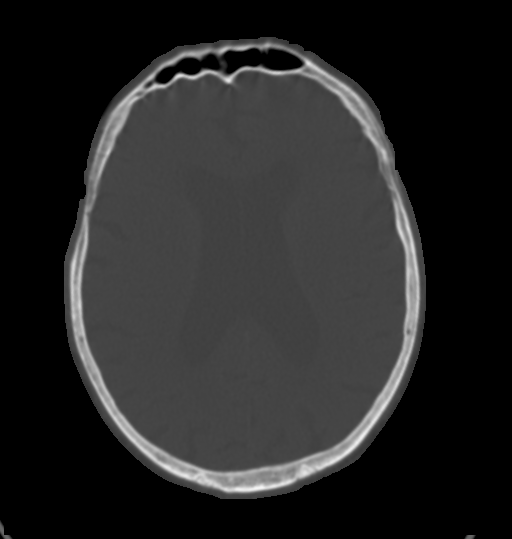
[im 39/61  brain]
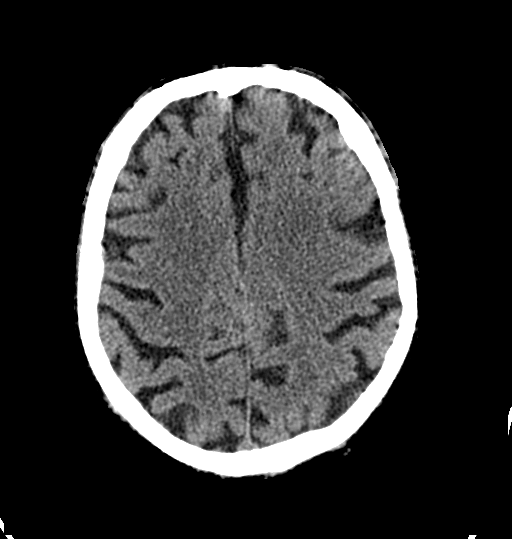
[im 50/61  brain]
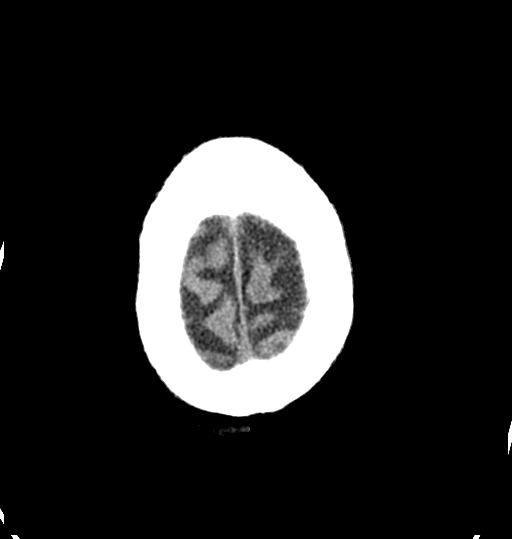
[im 55/61  brain]
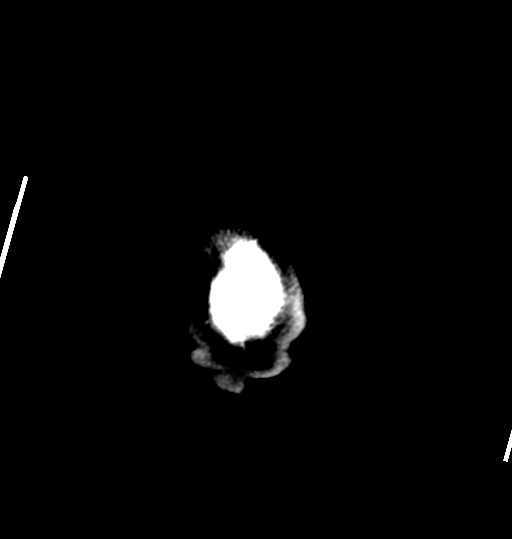

[14 of 47 positions shown; findings below may reference images not displayed]

FINDINGS: Brain: Mild motion artifact despite repeat acquisition. Stable
degree of atrophy and chronic small vessel ischemia. No intracranial
hemorrhage, mass effect, or midline shift. No hydrocephalus. The
basilar cisterns are patent. No evidence of territorial infarct or
acute ischemia. No extra-axial or intracranial fluid collection.

Vascular: Atherosclerosis of skullbase vasculature without
hyperdense vessel or abnormal calcification.

Skull: No fracture or focal lesion.

Sinuses/Orbits: Mild paranasal sinus thickening that is similar to
prior exam. No acute findings. Minimal opacification of lower right
mastoid air cells. Bilateral lens extraction.

Other: None.
IMPRESSION: 1. No acute intracranial abnormality. No skull fracture.
2. Stable atrophy and chronic small vessel ischemia.

## 2020-12-30 MED ORDER — ACETAMINOPHEN 325 MG PO TABS
650.0000 mg | ORAL_TABLET | Freq: Once | ORAL | Status: AC
  Administered 2020-12-30: 650 mg via ORAL
  Filled 2020-12-30: qty 2

## 2020-12-30 MED ORDER — AMIODARONE HCL 200 MG PO TABS
200.0000 mg | ORAL_TABLET | Freq: Every day | ORAL | Status: DC
  Administered 2020-12-31 – 2021-01-03 (×4): 200 mg via ORAL
  Filled 2020-12-30 (×4): qty 1

## 2020-12-30 MED ORDER — INSULIN ASPART 100 UNIT/ML IJ SOLN
0.0000 [IU] | INTRAMUSCULAR | Status: DC
  Administered 2020-12-30: 7 [IU] via SUBCUTANEOUS
  Administered 2020-12-31: 5 [IU] via SUBCUTANEOUS
  Administered 2020-12-31 (×2): 7 [IU] via SUBCUTANEOUS
  Administered 2020-12-31: 1 [IU] via SUBCUTANEOUS
  Administered 2021-01-01: 5 [IU] via SUBCUTANEOUS
  Administered 2021-01-01 (×2): 3 [IU] via SUBCUTANEOUS
  Administered 2021-01-01: 9 [IU] via SUBCUTANEOUS
  Administered 2021-01-02: 7 [IU] via SUBCUTANEOUS
  Administered 2021-01-02: 5 [IU] via SUBCUTANEOUS
  Administered 2021-01-02: 1 [IU] via SUBCUTANEOUS
  Administered 2021-01-02: 5 [IU] via SUBCUTANEOUS
  Administered 2021-01-02: 2 [IU] via SUBCUTANEOUS
  Filled 2020-12-30: qty 0.09

## 2020-12-30 MED ORDER — ACETAMINOPHEN 650 MG RE SUPP: 650.0000 mg | Freq: Four times a day (QID) | RECTAL | Status: DC | PRN

## 2020-12-30 MED ORDER — TAMSULOSIN HCL 0.4 MG PO CAPS
0.4000 mg | ORAL_CAPSULE | Freq: Every day | ORAL | Status: DC
  Administered 2020-12-31: 0.4 mg via ORAL
  Filled 2020-12-30 (×4): qty 1

## 2020-12-30 MED ORDER — INSULIN GLARGINE 100 UNIT/ML ~~LOC~~ SOLN: 10.0000 [IU] | Freq: Every day | SUBCUTANEOUS | Status: DC

## 2020-12-30 MED ORDER — INSULIN GLARGINE-LIXISENATIDE 100-33 UNT-MCG/ML ~~LOC~~ SOPN: 26.0000 [IU] | PEN_INJECTOR | Freq: Every day | SUBCUTANEOUS | Status: DC

## 2020-12-30 MED ORDER — METOPROLOL TARTRATE 50 MG PO TABS
50.0000 mg | ORAL_TABLET | Freq: Two times a day (BID) | ORAL | Status: DC
  Administered 2020-12-30 – 2021-01-02 (×4): 50 mg via ORAL
  Filled 2020-12-30 (×7): qty 1

## 2020-12-30 MED ORDER — ENSURE ENLIVE PO LIQD
237.0000 mL | Freq: Two times a day (BID) | ORAL | Status: DC
  Administered 2020-12-31 – 2021-01-02 (×6): 237 mL via ORAL

## 2020-12-30 MED ORDER — APIXABAN 5 MG PO TABS
5.0000 mg | ORAL_TABLET | Freq: Two times a day (BID) | ORAL | Status: DC
  Administered 2020-12-30 – 2021-01-03 (×8): 5 mg via ORAL
  Filled 2020-12-30 (×8): qty 1

## 2020-12-30 MED ORDER — HYDROCODONE-ACETAMINOPHEN 5-325 MG PO TABS
1.0000 | ORAL_TABLET | ORAL | Status: DC | PRN
  Filled 2020-12-30: qty 2

## 2020-12-30 MED ORDER — LEVOFLOXACIN IN D5W 500 MG/100ML IV SOLN
500.0000 mg | Freq: Once | INTRAVENOUS | Status: AC
  Administered 2020-12-30: 500 mg via INTRAVENOUS
  Filled 2020-12-30: qty 100

## 2020-12-30 MED ORDER — LEVOFLOXACIN IN D5W 500 MG/100ML IV SOLN
500.0000 mg | Freq: Every day | INTRAVENOUS | Status: DC
  Administered 2020-12-31: 500 mg via INTRAVENOUS
  Filled 2020-12-30: qty 100

## 2020-12-30 MED ORDER — SODIUM CHLORIDE 0.9 % IV SOLN
75.0000 mL/h | INTRAVENOUS | Status: AC
  Administered 2020-12-30: 75 mL/h via INTRAVENOUS

## 2020-12-30 MED ORDER — ACETAMINOPHEN 325 MG PO TABS
650.0000 mg | ORAL_TABLET | Freq: Four times a day (QID) | ORAL | Status: DC | PRN
  Administered 2020-12-31 – 2021-01-02 (×4): 650 mg via ORAL
  Filled 2020-12-30 (×5): qty 2

## 2020-12-30 MED ORDER — LACTATED RINGERS IV BOLUS
30.0000 mL/kg | Freq: Once | INTRAVENOUS | Status: AC
  Administered 2020-12-30: 2205 mL via INTRAVENOUS

## 2020-12-30 MED ORDER — BUDESONIDE 3 MG PO CPEP
9.0000 mg | ORAL_CAPSULE | Freq: Every day | ORAL | Status: DC
  Administered 2020-12-31 – 2021-01-03 (×4): 9 mg via ORAL
  Filled 2020-12-30 (×4): qty 3

## 2020-12-30 MED ORDER — FENTANYL CITRATE (PF) 100 MCG/2ML IJ SOLN
50.0000 ug | Freq: Once | INTRAMUSCULAR | Status: AC
  Administered 2020-12-30: 50 ug via INTRAVENOUS
  Filled 2020-12-30: qty 2

## 2020-12-30 MED ORDER — FOLIC ACID 1 MG PO TABS
1.0000 mg | ORAL_TABLET | Freq: Every day | ORAL | Status: DC
  Administered 2020-12-31 – 2021-01-03 (×4): 1 mg via ORAL
  Filled 2020-12-30 (×4): qty 1

## 2020-12-30 MED ORDER — INSULIN GLARGINE 100 UNIT/ML ~~LOC~~ SOLN
15.0000 [IU] | Freq: Every day | SUBCUTANEOUS | Status: DC
  Administered 2020-12-30 – 2021-01-02 (×4): 15 [IU] via SUBCUTANEOUS
  Filled 2020-12-30 (×4): qty 0.15

## 2020-12-30 NOTE — H&P (Signed)
Tommy Peterson XBW:[id number] DOB: 01-24-1941 DOA: 12/30/2020     PCP: [name], DO   Outpatient Specialists:   CARDS:  Dr. Sallyanne Kuster    Oncology  Dr.Kale GI  Dr.  Renee Harder, Sharen Hint, MD  Urology not wit Urological aliance  Patient arrived to ER on 12/30/20 at 1543 Referred by Attending Truddie Hidden, MD;D*   Patient coming from: home Lives  With family    Chief Complaint  Chief Complaint  Patient presents with  . Testicle Pain    HPI: Tommy Peterson is a 80 y.o. male with medical history significant of Large Granular Lymphocytic leukemia chronic leukocytosis, macrocytic anemia, MGUS, recurent UTI, DM2. chornic diarrhea, a.fib on eliquis diastolic heart failure    Presented with  5 days of  Painful urination fever up to 102 and scrotal swelling Had a fall 2 wks ago and since then have had a headache Has been somewhat more confused  Last admission was for urinary tract infection with 2 strains of E. coli both pansensitive except for ampicillin. On admission he was transfused 1 unit of packed red blood cells his hemoglobin was 6.9 and remained stable after this  The pt notes he has been hospitalized six times this year, with four being for septic shock. His Urologist instructed him to urinate more once he believes he is finishes. He keeps getting recurrent UTI due to not completely emptying the bladder. The pt notes he has recently had four falls due to his BP dropping quickly   Methotrexate on hold due to reccurent infections Infectious risk factors:  Reports   Fever,     Has  been vaccinated against COVID  and boosted   Initial COVID TEST  NEGATIVE   Lab Results  Component Value Date   [place] NEGATIVE 12/30/2020   [place] NEGATIVE 12/09/2020   [place] NEGATIVE 11/06/2020   [place] NEGATIVE 11/02/2020     Regarding pertinent Chronic problems:      HTN on  Metoprolol   chronic CHF diastolic - last echo April 2022  showing preserved EF grade 2 diastolic dysfunction On 20 mg a day    DM 2 -  Lab Results  Component Value Date   HGBA1C 8.3 (H) 11/06/2020   on insulin,    A. Fib -  - CHA2DS2 vas score   5       current  on anticoagulation with  Eliquis,           -  Rate control:  Currently controlled with   Metoprolol          - Rhythm control:  Amiodarone,          BPH - on  Flomax and Oxybutynin      Chronic anemia - baseline hg Hemoglobin & Hematocrit  Recent Labs    12/10/20 0432 12/23/20 0909 12/30/20 1715  HGB 7.6* 8.1* 9.0*     While in ER:  Noted to have evidence of epididymitis based on ultrasound On the right. Given the presence of penile prosthesis urology was made aware that patient is to follow-up as an outpatient Agree with Levaquin coverage for now ED Triage Vitals  Enc Vitals Group     BP 12/30/20 1600 (!) 158/120     Pulse Rate 12/30/20 1600 (!) 53     Resp 12/30/20 1630 17     Temp 12/30/20 1600 98.2 F (36.8 C)     Temp Source 12/30/20 1600 Oral  SpO2 12/30/20 1600 100 %     Weight --      Height --      Head Circumference --      Peak Flow --      Pain Score 12/30/20 1559 8     Pain Loc --      Pain Edu? --      Excl. in [place]? --   TMAX(24)@     _________________________________________ Significant initial  Findings: Abnormal Labs Reviewed  CBC WITH DIFFERENTIAL/PLATELET - Abnormal; Notable for the following components:      Result Value   WBC 18.0 (*)    RBC 2.28 (*)    Hemoglobin 9.0 (*)    HCT 26.5 (*)    MCV 116.2 (*)    MCH 39.5 (*)    Neutro Abs 10.7 (*)    Lymphs Abs 5.4 (*)    Monocytes Absolute 1.5 (*)    Abs Immature Granulocytes 0.41 (*)    All other components within normal limits  URINALYSIS, ROUTINE W REFLEX MICROSCOPIC - Abnormal; Notable for the following components:   APPearance CLOUDY (*)    Glucose, UA >=500 (*)    Ketones, ur 20 (*)    Nitrite POSITIVE (*)    Leukocytes,Ua LARGE (*)    WBC, UA >50 (*)    Bacteria, UA  MANY (*)    All other components within normal limits  COMPREHENSIVE METABOLIC PANEL - Abnormal; Notable for the following components:   Sodium 131 (*)    Chloride 93 (*)    Glucose, Bld 324 (*)    Creatinine, Ser 0.58 (*)    All other components within normal limits  LACTIC ACID, PLASMA - Abnormal; Notable for the following components:   Lactic Acid, Venous 3.2 (*)    All other components within normal limits   ____________________________________________ Ordered CT HEAD   NON acute  scrotal US - epididymitis on the right     ECG: Ordered    ____________________ This patient meets SIRS Criteria and may be septic.  The recent clinical data is shown below. Vitals:   12/30/20 1700 12/30/20 1754 12/30/20 1800 12/30/20 1834  BP: (!) 177/123 (!) 157/61 (!) 152/58 (!) 141/57  Pulse: 74 69 69 77  Resp: $Remo'18 18  18  'WRlWU$ Temp:  (!) 103.2 F (39.6 C)    TempSrc:  Rectal    SpO2: 99% 93% 93% 100%      WBC     Component Value Date/Time   WBC 18.0 (H) 12/30/2020 1715   LYMPHSABS 5.4 (H) 12/30/2020 1715   MONOABS 1.5 (H) 12/30/2020 1715   EOSABS 0.0 12/30/2020 1715   BASOSABS 0.0 12/30/2020 1715      Lactic Acid, Venous    Component Value Date/Time   LATICACIDVEN 3.2 (HH) 12/30/2020 1715     Procalcitonin   Ordered Lactic Acid, Venous    Component Value Date/Time   LATICACIDVEN 1.5 12/30/2020 1929     UA   evidence of UTI      Urine analysis:    Component Value Date/Time   COLORURINE YELLOW 12/30/2020 1614   APPEARANCEUR CLOUDY (A) 12/30/2020 1614   LABSPEC 1.020 12/30/2020 1614   PHURINE 8.0 12/30/2020 1614   GLUCOSEU >=500 (A) 12/30/2020 1614   HGBUR NEGATIVE 12/30/2020 1614   [place] 12/30/2020 1614   BILIRUBINUR negative 10/29/2017 1428   KETONESUR 20 (A) 12/30/2020 1614   PROTEINUR NEGATIVE 12/30/2020 1614   UROBILINOGEN 0.2 10/29/2017 1428   NITRITE POSITIVE (  A) 12/30/2020 1614   LEUKOCYTESUR LARGE (A) 12/30/2020 1614    Results for orders  placed or performed during the hospital encounter of 12/30/20  Resp Panel by RT-PCR (Flu A&B, Covid) Nasopharyngeal Swab     Status: None   Collection Time: 12/30/20  4:15 PM   Specimen: Nasopharyngeal Swab; Nasopharyngeal(NP) swabs in vial transport medium  Result Value Ref Range Status   SARS Coronavirus 2 by RT PCR NEGATIVE NEGATIVE Final         Influenza A by PCR NEGATIVE NEGATIVE Final   Influenza B by PCR NEGATIVE NEGATIVE Final           _______________________________________________________ ER Provider Called:    Dr. Louis Meckel   They Recommend admit to medicine  Ok with Levaquin Will need out pt follow up _______________________________________________ Hospitalist was called for admission for epididemitis  The following Work up has been ordered so far:  Orders Placed This Encounter  Procedures  . Urine culture  . Culture, blood (routine x 2)  . Resp Panel by RT-PCR (Flu A&B, Covid) Nasopharyngeal Swab  . US SCROTUM W/DOPPLER  . CBC with Differential  . Urinalysis, Routine w reflex microscopic  . Comprehensive metabolic panel  . Lactic acid, plasma  . Cardiac monitoring  . Consult to hospitalist  . Consult to urology  . Airborne and Contact precautions  . Place in observation (patient's expected length of stay will be less than 2 midnights)    Following Medications were ordered in ER: Medications  fentaNYL (SUBLIMAZE) injection 50 mcg (50 mcg Intravenous Given 12/30/20 1711)  levofloxacin (LEVAQUIN) IVPB 500 mg (0 mg Intravenous Stopped 12/30/20 1822)  acetaminophen (TYLENOL) tablet 650 mg (650 mg Oral Given 12/30/20 1819)  lactated ringers bolus 2,205 mL (2,205 mLs Intravenous New Bag/Given (Non-Interop) 12/30/20 1821)        Consult Orders  (From admission, onward)         Start     Ordered   12/30/20 1812  Consult to hospitalist  Once       Provider:  (Not yet assigned)  Question Answer Comment  Place call to: [facility]   Reason for Consult  Admit      12/30/20 1811          OTHER Significant initial  Findings:  labs showing:    Recent Labs  Lab 12/30/20 1715  NA 131*  K 4.3  CO2 27  GLUCOSE 324*  BUN 14  CREATININE 0.58*  CALCIUM 9.1    Cr  Stable,  Lab Results  Component Value Date   CREATININE 0.58 (L) 12/30/2020   CREATININE 0.71 12/23/2020   CREATININE 0.53 (L) 12/10/2020    Recent Labs  Lab 12/30/20 1715  AST 19  ALT 36  ALKPHOS 84  BILITOT 1.2  PROT 7.5  ALBUMIN 3.7   Lab Results  Component Value Date   CALCIUM 9.1 12/30/2020          Plt: Lab Results  Component Value Date   PLT 244 12/30/2020        Recent Labs  Lab 12/30/20 1715  WBC 18.0*  NEUTROABS 10.7*  HGB 9.0*  HCT 26.5*  MCV 116.2*  PLT 244    HG/HCT stable     Component Value Date/Time   HGB 9.0 (L) 12/30/2020 1715   HGB 5.9 (LL) 11/01/2020 1710   HCT 26.5 (L) 12/30/2020 1715   HCT 17.9 (LL) 11/01/2020 1710   MCV 116.2 (H) 12/30/2020 1715   MCV 93  11/01/2020 1710    BNP (last 3 results) Recent Labs    11/06/20 0450  BNP 389.9*    DM  labs:  HbA1C: Recent Labs    04/07/20 0055 11/06/20 1028  HGBA1C 7.5* 8.3*       CBG (last 3)  No results for input(s): GLUCAP in the last 72 hours.        Cultures:    Component Value Date/Time   SDES BLOOD LEFT ARM 12/09/2020 1010   SPECREQUEST  12/09/2020 1010    BOTTLES DRAWN AEROBIC AND ANAEROBIC Blood Culture adequate volume   CULT  12/09/2020 1010    NO GROWTH 5 DAYS Performed at [facility], [place] [address], [place],  [address]    REPTSTATUS 12/14/2020 FINAL 12/09/2020 1010     Radiological Exams on Admission: US SCROTUM W/DOPPLER  Result Date: 12/30/2020 CLINICAL DATA:  Right testicle pain with fever EXAM: SCROTAL ULTRASOUND DOPPLER ULTRASOUND OF THE TESTICLES TECHNIQUE: Complete ultrasound examination of the testicles, epididymis, and other scrotal structures was performed. Color and spectral Doppler ultrasound were also  utilized to evaluate blood flow to the testicles. COMPARISON:  None. FINDINGS: Right testicle Measurements: 4.7 x 2.1 x 4.1 cm. No mass or microlithiasis visualized. Left testicle Measurements: 4.8 x 2.4 x 2.4 cm. No mass or microlithiasis visualized. Heterogeneous echotexture without discrete mass. Right epididymis:  Heterogenous and enlarged, slightly hyperemic. Left epididymis:  Normal in size and appearance. Hydrocele:  Small right-sided hydrocele. Varicocele:  None visualized. Pulsed Doppler interrogation of both testes demonstrates normal low resistance arterial and venous waveforms bilaterally. IMPRESSION: 1. Heterogenous, enlarged right epididymis with slight increased vascularity suspicious for epididymitis. 2. Slightly smaller diffusely heterogeneous left testis compared to the right, question mild atrophy and previous insult. Slightly asymmetric diminished flow to left testis but without definitive evidence for acute torsion. Electronically Signed   By: Donavan Foil M.D.   On: 12/30/2020 16:56   _______________________________________________________________________________________________________ Latest  Blood pressure (!) 141/57, pulse 77, temperature (!) 103.2 F (39.6 C), temperature source Rectal, resp. rate 18, SpO2 100 %.   Review of Systems:    Pertinent positives include  Fevers, chills, fatigue, no confusion  Constitutional:  No weight loss, night sweats, weight loss  HEENT:  No headaches, Difficulty swallowing,Tooth/dental problems,Sore throat,  No sneezing, itching, ear ache, nasal congestion, post nasal drip,  Cardio-vascular:  No chest pain, Orthopnea, PND, anasarca, dizziness, palpitations.no Bilateral lower extremity swelling  GI:  No heartburn, indigestion, abdominal pain, nausea, vomiting, diarrhea, change in bowel habits, loss of appetite, melena, blood in stool, hematemesis Resp:  no shortness of breath at rest. No dyspnea on exertion, No excess mucus, no  productive cough, No non-productive cough, No coughing up of blood.No change in color of mucus.No wheezing. Skin:  no rash or lesions. No jaundice GU:  no dysuria, change in color of urine, no urgency or frequency. No straining to urinate.  No flank pain.  Musculoskeletal:  No joint pain or no joint swelling. No decreased range of motion. No back pain.  Psych:  No change in mood or affect. No depression or anxiety. No memory loss.  Neuro: no localizing neurological complaints, no tingling, no weakness, no double vision, no gait abnormality, no slurred speech,   All systems reviewed and apart from [place] all are negative _______________________________________________________________________________________________ Past Medical History:   Past Medical History:  Diagnosis Date  . Acute respiratory failure ([place])   . BCC (basal cell carcinoma of skin) 08/15/2016   w/SCC Left  Forehead (treatment @ [facility])  . BPH (benign prostatic hyperplasia)   . Carpal tunnel syndrome on both sides   . Chronic diarrhea    intermittant due to chronic collagenous colitis  . Chronic large granular lymphocytic leukemia ([place]) primary hemotologist-  [name]. 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 Collagenous colitis    chronic--- intermittant between diarrahea and constipation  . ED (erectile dysfunction)   . HCAP (healthcare-associated pneumonia)   . History of adenomatous polyp of colon   . History of Clostridium difficile colitis 05/2016  . History of pneumonia 11/08/2017   CAP -- LLL---  12-19-2017  per pt no residual symptoms  . History of SCC (squamous cell carcinoma) of skin   . History of sepsis    11-20-2017 severe sepsis due to UTI;  10/ 2017sepsis due to c-diff colitis   . Leucocytosis    chronic  . Lower urinary tract symptoms (LUTS)   . Macrocytic anemia    since 02/ 2015  . MGUS (monoclonal gammopathy of unknown significance)    hemotologist-  [name] @ [facility]  . Monoclonal paraproteinemia   . Neuropathy, peripheral   . Nodular basal cell carcinoma (BCC) 10/01/2018   Left Neck(Nodular) Pt Does Not Want Treatment  . Nodular basal cell carcinoma (BCC) 10/01/2018   Mid Forehead (treament curet and excision)  . OA (osteoarthritis)   . Prostatic stone   . Rash of face    RIGHT SIDE  . Raynaud's syndrome   . Recurrent BCC (basal cell carcinoma) 03/05/2019   positive margin  . SCC (squamous cell carcinoma) 08/10/2009   Central Forehead (Moh's Dr. Rhona Raider @ [facility])  . SCC (squamous cell carcinoma) 12/08/2011   CIS-Left Cheek (treatment [place] @ [facility])  . SCC (squamous cell carcinoma) 07/09/2015   CIS-Left Forehead (treament @ [facility])  . Septic shock (HCC)    x 3 w/UTI  . Squamous cell carcinoma in situ (SCCIS) 12/08/2011   Left Cheek (treatment Aldara @ [facility])   . Superficial basal cell carcinoma (BCC) 10/01/2018   Left Shin-Pt does not want treatment  . Thin skin    fragile  . Transfusion-dependent anemia since 10/ 2017   last transfusion PRBCs 07-19-2017  per hemologist note dated 10-25-2017  . Type 2 diabetes mellitus treated with insulin ([place])    followed by [name] (pcp)  . Urinary hesitancy   . Wears contact lenses    LEFT EYE ONLY      Past Surgical History:  Procedure Laterality Date  . BONE MARROW BIOPSY Right 05-24-2016;  08-20-2014  . CARDIOVASCULAR STRESS TEST  11-26-2012   [name]  @ [place]   normal nuclear study w/ no ischemia/  normal LV function and wall motion , ef 60% (find in care everywhere,epic)  . CARDIOVERSION N/A 08/31/2020   Procedure: CARDIOVERSION;  Surgeon: [name], MD;  Location: [facility];  Service: Cardiovascular;  Laterality: N/A;  . CATARACT  EXTRACTION W/ INTRAOCULAR LENS  IMPLANT, BILATERAL  2015  . CYSTOSCOPY WITH LITHOLAPAXY N/A 12/25/2017   Procedure: CYSTOSCOPY WITH LITHOLAPAXY AND FULGERATION;  Surgeon: [name], MD;  Location[facility];  Service: Urology;  Laterality: N/A;  . INGUINAL HERNIA REPAIR Right 1980s  . LOOP RECORDER INSERTION N/A  04/10/2018   Procedure: LOOP RECORDER INSERTION;  Surgeon: [name], MD;  Location: [facility];  Service: Cardiovascular;  Laterality: N/A;  . PENILE PROSTHESIS IMPLANT  12-02-2015   dr [name] @ [facility] in [place]  . SHOULDER SURGERY Left 08/2016  . TEE WITHOUT CARDIOVERSION N/A 08/31/2020   Procedure: TRANSESOPHAGEAL ECHOCARDIOGRAM (TEE);  Surgeon: [name], MD;  Location: [facility];  Service: Cardiovascular;  Laterality: N/A;  . TONSILLECTOMY  child  . TRANSURETHRAL RESECTION OF PROSTATE  2009   AND REPAIR RECURRENT RIGHT INGUINAL HERNIA    Social History:  Ambulatory   walker        reports that he quit smoking about 24 years ago. His smoking use included cigarettes. He quit after 20.00 years of use. He has never used smokeless tobacco. He reports current alcohol use. He reports that he does not use drugs.   Family History:   Family History  Problem Relation Age of Onset  . Diabetes Mother   . Heart failure Mother    ______________________________________________________________________________________________ Allergies: Allergies  Allergen Reactions  . Bee Venom Swelling and Anaphylaxis  . Metformin And Related Nausea And Vomiting  . Sulfamethoxazole-Trimethoprim Nausea And Vomiting  . Bee Pollen Other (See Comments)    Unknown   . Metformin Hcl Other (See Comments)    sick      Prior to Admission medications   Medication Sig Start Date End Date Taking? Authorizing Provider  amiodarone (PACERONE) 200 MG tablet Take 1 tablet (200 mg total) by mouth daily. 09/29/20   [facility], Tami Lin, PA  apixaban  (ELIQUIS) 5 MG TABS tablet Take 1 tablet (5 mg total) by mouth 2 (two) times daily. 08/18/20   Croitoru, Mihai, MD  b complex vitamins capsule Take 1 capsule by mouth every evening.    [provider]  BD PEN NEEDLE NANO 2ND GEN 32G X 4 MM MISC  07/22/20   [provider]  budesonide (ENTOCORT EC) 3 MG 24 hr capsule Take 9 mg by mouth daily.    [provider]  Cranberry 500 MG TABS Take 500 mg by mouth daily.    [provider]  Cyanocobalamin (VITAMIN B-12) 5000 MCG SUBL Place 5,000 mcg under the tongue daily.    [provider]  D-Mannose 500 MG CAPS Take 500 mg by mouth in the morning and at bedtime.    [provider]  folic acid (FOLVITE) 1 MG tablet TAKE 1 TABLET BY MOUTH EVERY DAY 07/23/20   Brunetta Genera, MD  furosemide (LASIX) 20 MG tablet Take 20 mg by mouth daily.    [provider]  Insulin Glargine-Lixisenatide 100-33 UNT-MCG/ML SOPN Inject 26 Units into the skin at bedtime. [place] 100/33    [provider]  metoprolol tartrate (LOPRESSOR) 50 MG tablet Take 50 mg by mouth 2 (two) times daily.    [provider]  NOVOLOG FLEXPEN 100 UNIT/ML FlexPen Inject 6 Units into the skin 3 (three) times daily with meals. 02/21/19   [provider]  oxybutynin (DITROPAN) 5 MG tablet Take 5 mg by mouth daily as needed (To slow down urine).    [provider]  pioglitazone (ACTOS) 15 MG tablet Take 15 mg by mouth every morning. 10/18/17   [provider]  tamsulosin (FLOMAX) 0.4 MG CAPS capsule Take 1 capsule (0.4 mg total) by mouth daily after breakfast. 12/13/20   Charlynne Cousins, MD  traMADol Veatrice Bourbon) 50 MG tablet Take  50 mg by mouth at bedtime as needed (restlessness).    [provider]  traZODone (DESYREL) 50 MG tablet Take 100 mg by mouth at bedtime as needed for sleep. 12/05/20   [provider]     ___________________________________________________________________________________________________ Physical Exam: Vitals with BMI 12/30/2020 12/30/2020 12/30/2020  Height - - -  Weight - - -  BMI - - -  Systolic 520 802 233  Diastolic 57 58 61  Pulse 77 69 69     1. General:  in No  Acute distress    Chronically ill -appearing 2. Psychological: Alert and Oriented to self not situation, confused about time of events 3. Head/ENT:    Dry Mucous Membranes                          Head Non traumatic, neck supple                            Poor Dentition 4. SKIN:   decreased Skin turgor,  Skin clean Dry and intact no rash 5. Heart: Regular rate and rhythm no Murmur, no Rub or gallop 6. Lungs:   no wheezes or crackles   7. Abdomen: Soft,  non-tender, Non distended  bowel sounds present 8. Lower extremities: no clubbing, cyanosis, no  edema 9. Neurologically   strength 5 out of 5 in all 4 extremities cranial nerves II through XII intact 10. MSK: Normal range of motion    Chart has been reviewed  ______________________________________________________________________________________________  Assessment/Plan  80 y.o. male with medical history significant of Large Granular Lymphocytic leukemia chronic leukocytosis, macrocytic anemia, MGUS, recurent UTI, DM2. chornic diarrhea, a.fib on eliquis diastolic heart failure Admitted for sepsis in the setting of UTI/epididymitis   Present on Admission: . Sepsis ([place]) -   -SIRS criteria met with elevated white blood cell count,     fever        Component Value Date/Time   WBC 18.0 (H) 12/30/2020 1715   LYMPHSABS 5.4 (H) 12/30/2020 1715     -Most likely source being:  urinary   Patient meeting criteria for Severe sepsis with    evidence of end organ damage/organ dysfunction such as   elevated lactic acid >2  acute metabolic encephalopathy     - Obtain serial lactic acid and procalcitonin level.  - Initiated IV antibiotics   - await  results of blood and urine culture  - Rehydrate aggressively      8:43 PM . Epididymitis/ . UTI (urinary tract infection) -  Treat for Levaquin discussed with urology who agrees  . Paroxysmal atrial fibrillation (HCC) -continue metoprolol Eliquis and amiodarone we will need to repeat EKG in a.m. to monitor QT prolongation in the setting of amiodarone being used together with Levaquin  . Macrocytic anemia chronic stable no indication for transfusion at this Time . Large granular lymphocytic leukemia ([place]) -followed by oncology currently stable  . Essential hypertension -resume metoprolol but continue to monitor blood pressures  no evidence of hypotension  . Colitis, collagenous -chronic stable continue home medications  . BPH (benign prostatic hyperplasia) -continue Flomax  chronic diastolic CHF.  Current appears to be slightly on the dry side will hold Lasix and gently rehydrate  . Dehydration gently rehydrate and hold leg  . Hyponatremia most likely secondary to dehydration will obtain electrolytes and gently rehydrate  . Head injury -repeat head CT unremarkable  . Acute metabolic  encephalopathy in the setting of UTI.  CT head within normal limits will rehydrate gently and treat underlying infection  Dm2 - -continue home insulin order sliding scale hold p.o. medications  Other plan as per orders.  DVT prophylaxis:  eliquis       Code Status:    Code Status: Prior FULL CODE  as per patient   I had personally discussed CODE STATUS with patient and family    Family Communication:   Family   at  Bedside  plan of care was discussed   With daughter   Disposition Plan:  To home once workup is complete and patient is stable   Following barriers for discharge:                            Electrolytes corrected                               Anemia stable                             Pain controlled with PO medications                               Afebrile, white count  improving able to transition to PO antibiotics                             Will need to be able to tolerate PO                                                Would benefit from PT/OT eval prior to DC  Ordered                                      Consults called: Urology aware will need outpt follow up    Admission status:  ED Disposition    ED Disposition Condition [place]: [place] [[id number]]  Level of Care: Telemetry [5]  Admit to tele based on following criteria: Other see comments  Comments: sirs  Covid Evaluation: Confirmed COVID Negative  Diagnosis: Sepsis [facility]) [[id number]]  Admitting Physician: Toy Baker [[id number]]  Attending Physician: Toy Baker [[id number]]        Obs   Level of care     tele  For   24H     Lab Results  Component Value Date   [place] 12/30/2020     Precautions: admitted as   Covid Negative     PPE: Used by the provider:   N95  eye Goggles,  Gloves     Rony Ratz 12/30/2020, 8:34 PM    [facility]     after 2 AM please page floor coverage PA If 7AM-7PM, please contact the day team taking care of the patient using [email or web address]   Patient was evaluated in the context of the global COVID-19 pandemic, which necessitated consideration that the patient might be at risk for infection with the SARS-CoV-2 virus that causes COVID-19. Institutional  protocols and algorithms that pertain to the evaluation of patients at risk for COVID-19 are in a state of rapid change based on information released by regulatory bodies including the CDC and federal and state organizations. These policies and algorithms were followed during the patient's care.

## 2020-12-30 NOTE — ED Notes (Signed)
Patient transported to CT 

## 2020-12-30 NOTE — ED Notes (Signed)
Per [place] clinic, states patient is positive for right testicular torsion-sending to ED for further eval

## 2020-12-30 NOTE — ED Provider Notes (Signed)
[facility] EMERGENCY DEPARTMENT Provider Note  CSN: [id number] Arrival date & time: 12/30/20 1543    History Chief Complaint  Patient presents with  . Testicle Pain    HPI  Tommy Peterson is a 80 y.o. male with history of LGL leukemia, recurrent UTI, with penile implant reports 3 days of fever, shaking chills and R testicular pain/swelling. He went to his PCP in the [place] today and was sent to the ED for concerns of a testicular torsion. Patient provides a rambling history, but daughter at bedside helps with details.    Past Medical History:  Diagnosis Date  . Acute respiratory failure ([place])   . BCC (basal cell carcinoma of skin) 08/15/2016   w/SCC Left Forehead (treatment @ [facility])  . BPH (benign prostatic hyperplasia)   . Carpal tunnel syndrome on both sides   . Chronic diarrhea    intermittant due to chronic collagenous colitis  . 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 Collagenous colitis    chronic--- intermittant between diarrahea and constipation  . ED (erectile dysfunction)   . HCAP (healthcare-associated pneumonia)   . History of adenomatous polyp of colon   . History of Clostridium difficile colitis 05/2016  . History of pneumonia 11/08/2017   CAP -- LLL---  12-19-2017  per pt no residual symptoms  . History of SCC (squamous cell carcinoma) of skin   . History of sepsis    11-20-2017 severe sepsis due to UTI;  10/ 2017sepsis due to c-diff colitis  . Leucocytosis    chronic  . Lower urinary tract symptoms (LUTS)   . Macrocytic anemia    since 02/ 2015  . MGUS (monoclonal gammopathy of unknown significance)    hemotologist-  [name] @ [facility]  .  Monoclonal paraproteinemia   . Neuropathy, peripheral   . Nodular basal cell carcinoma (BCC) 10/01/2018   Left Neck(Nodular) Pt Does Not Want Treatment  . Nodular basal cell carcinoma (BCC) 10/01/2018   Mid Forehead (treament curet and excision)  . OA (osteoarthritis)   . Prostatic stone   . Rash of face    RIGHT SIDE  . Raynaud's syndrome   . Recurrent BCC (basal cell carcinoma) 03/05/2019   positive margin  . SCC (squamous cell carcinoma) 08/10/2009   Central Forehead (Moh's Dr. Rhona Raider @ [facility])  . SCC (squamous cell carcinoma) 12/08/2011   CIS-Left Cheek (treatment Aldara @ [facility])  . SCC (squamous cell carcinoma) 07/09/2015   CIS-Left Forehead (treament @ [facility])  . Septic shock (HCC)    x 3 w/UTI  . Squamous cell carcinoma in situ (SCCIS) 12/08/2011   Left Cheek (treatment Aldara @ [facility])   . Superficial basal cell carcinoma (BCC) 10/01/2018   Left Shin-Pt does not want treatment  . Thin skin    fragile  . Transfusion-dependent anemia since 10/ 2017   last transfusion PRBCs 07-19-2017  per hemologist note dated 10-25-2017  . Type 2 diabetes mellitus treated with insulin ([place])    followed by [name] (pcp)  . Urinary hesitancy   . Wears contact lenses    LEFT EYE ONLY    Past Surgical History:  Procedure Laterality Date  . BONE MARROW BIOPSY Right  05-24-2016;  08-20-2014  . CARDIOVASCULAR STRESS TEST  11-26-2012   [name]  @ [place]   normal nuclear study w/ no ischemia/  normal LV function and wall motion , ef 60% (find in care everywhere,epic)  . CARDIOVERSION N/A 08/31/2020   Procedure: CARDIOVERSION;  Surgeon: [name], MD;  Location: [facility];  Service: Cardiovascular;  Laterality: N/A;  . CATARACT EXTRACTION W/ INTRAOCULAR LENS  IMPLANT, BILATERAL  2015  . CYSTOSCOPY WITH LITHOLAPAXY N/A 12/25/2017   Procedure: CYSTOSCOPY WITH LITHOLAPAXY AND FULGERATION;  Surgeon: [name], MD;  Location: [facility];  Service: Urology;  Laterality: N/A;  . INGUINAL HERNIA REPAIR Right 1980s  . LOOP RECORDER INSERTION N/A 04/10/2018   Procedure: LOOP RECORDER INSERTION;  Surgeon: [name], MD;  Location: [facility] CV LAB;  Service: Cardiovascular;  Laterality: N/A;  . PENILE PROSTHESIS IMPLANT  12-02-2015   dr [name] @ [facility] in [place]  . SHOULDER SURGERY Left 08/2016  . TEE WITHOUT CARDIOVERSION N/A 08/31/2020   Procedure: TRANSESOPHAGEAL ECHOCARDIOGRAM (TEE);  Surgeon: [name], MD;  Location: [facility];  Service: Cardiovascular;  Laterality: N/A;  . TONSILLECTOMY  child  . TRANSURETHRAL RESECTION OF PROSTATE  2009   AND REPAIR RECURRENT RIGHT INGUINAL HERNIA    Family History  Problem Relation Age of Onset  . Diabetes Mother   . Heart failure Mother     Social History   Tobacco Use  . Smoking status: Former Smoker    Years: 20.00    Types: Cigarettes    Quit date: 05/04/1996    Years since quitting: 24.6  . Smokeless tobacco: Never Used  Vaping Use  . Vaping Use: Never used  Substance Use Topics  . Alcohol use: Yes    Comment: 1-2 glasses of wine every other day  . Drug use: No     Home Medications Prior to Admission medications   Medication Sig Start Date End Date Taking? Authorizing Provider  amiodarone (PACERONE) 200 MG tablet Take 1 tablet (200 mg total) by mouth daily. 09/29/20  Yes [facility], Tami Lin, PA  apixaban (ELIQUIS) 5 MG TABS tablet Take 1 tablet (5 mg total) by mouth 2 (two) times daily. 08/18/20  Yes Croitoru, Mihai, MD  B Complex Vitamins (B COMPLEX 100 PO) Take 1 tablet by mouth daily.   Yes [provider]  budesonide (ENTOCORT EC) 3 MG 24 hr capsule Take 9 mg by mouth daily.   Yes [provider]  Cranberry 500 MG TABS Take 500 mg by mouth daily.   Yes [provider]  Cyanocobalamin (VITAMIN B-12) 5000 MCG SUBL Place 5,000 mcg under the tongue daily.   Yes [provider]   D-Mannose 500 MG CAPS Take 500 mg by mouth in the morning and at bedtime.   Yes [provider]  folic acid (FOLVITE) 1 MG tablet TAKE 1 TABLET BY MOUTH EVERY DAY 07/23/20  Yes Brunetta Genera, MD  furosemide (LASIX) 40 MG tablet Take 40 mg by mouth daily.   Yes [provider]  Insulin Glargine-Lixisenatide 100-33 UNT-MCG/ML SOPN Inject 26 Units into the skin at bedtime. Soliqua 100/33   Yes [provider]  metoprolol tartrate (LOPRESSOR) 50 MG tablet Take 50 mg by mouth 2 (two) times daily.   Yes [provider]  NOVOLOG FLEXPEN 100 UNIT/ML FlexPen Inject 6 Units into the skin 3 (three) times daily as needed for high blood sugar. 02/21/19  Yes  [provider]  omeprazole (PRILOSEC) 40 MG capsule Take 40 mg by mouth in the morning and at bedtime.   Yes [provider]  Phosphatidylserine (NEURO-PS PO) Take 300 mg by mouth daily.   Yes [provider]  pioglitazone (ACTOS) 15 MG tablet Take 15 mg by mouth every morning. 10/18/17  Yes [provider]  tamsulosin (FLOMAX) 0.4 MG CAPS capsule Take 1 capsule (0.4 mg total) by mouth daily after breakfast. 12/13/20  Yes Charlynne Cousins, MD  traZODone (DESYREL) 50 MG tablet Take 50-100 mg by mouth at bedtime as needed for sleep. 12/05/20  Yes [provider]  BD PEN NEEDLE NANO 2ND GEN 32G X 4 MM MISC  07/22/20   [provider]     Allergies    Bee venom, Metformin and related, Sulfamethoxazole-trimethoprim, Bee pollen, and Metformin hcl   Review of Systems   Review of Systems Unable to assess due to mental status.    Physical Exam BP (!) 141/57   Pulse 62   Temp 97.8 F (36.6 C) (Oral)   Resp 19   Ht _0  (1.778 m)   Wt 73.4 kg   SpO2 100%   BMI 23.22 kg/m   Physical Exam Vitals and nursing note reviewed.  Constitutional:      Appearance: Normal appearance.  HENT:     Head: Normocephalic and atraumatic.     Nose: Nose normal.      Mouth/Throat:     Mouth: Mucous membranes are moist.  Eyes:     Extraocular Movements: Extraocular movements intact.     Conjunctiva/sclera: Conjunctivae normal.  Cardiovascular:     Rate and Rhythm: Normal rate.  Pulmonary:     Effort: Pulmonary effort is normal.     Breath sounds: Normal breath sounds.  Abdominal:     General: Abdomen is flat.     Palpations: Abdomen is soft.     Tenderness: There is no abdominal tenderness.  Genitourinary:    Comments: R testicular tenderness and swelling; penile implant palpable in shaft Musculoskeletal:        General: No swelling. Normal range of motion.     Cervical back: Neck supple.  Skin:    General: Skin is warm and dry.  Neurological:     General: No focal deficit present.     Mental Status: He is alert. He is disoriented.  Psychiatric:        Mood and Affect: Mood normal.      ED Results / Procedures / Treatments   Labs (all labs ordered are listed, but only abnormal results are displayed) Labs Reviewed  CBC WITH DIFFERENTIAL/PLATELET - Abnormal; Notable for the following components:      Result Value   WBC 18.0 (*)    RBC 2.28 (*)    Hemoglobin 9.0 (*)    HCT 26.5 (*)    MCV 116.2 (*)    MCH 39.5 (*)    Neutro Abs 10.7 (*)    Lymphs Abs 5.4 (*)    Monocytes Absolute 1.5 (*)    Abs Immature Granulocytes 0.41 (*)    All other components within normal limits  URINALYSIS, ROUTINE W REFLEX MICROSCOPIC - Abnormal; Notable for the following components:   APPearance CLOUDY (*)    Glucose, UA >=500 (*)    Ketones, ur 20 (*)    Nitrite POSITIVE (*)    Leukocytes,Ua LARGE (*)    WBC, UA >50 (*)    Bacteria, UA MANY (*)  All other components within normal limits  COMPREHENSIVE METABOLIC PANEL - Abnormal; Notable for the following components:   Sodium 131 (*)    Chloride 93 (*)    Glucose, Bld 324 (*)    Creatinine, Ser 0.58 (*)    All other components within normal limits  LACTIC ACID, PLASMA - Abnormal; Notable for  the following components:   Lactic Acid, Venous 3.2 (*)    All other components within normal limits  CK - Abnormal; Notable for the following components:   Total CK 20 (*)    All other components within normal limits  GLUCOSE, CAPILLARY - Abnormal; Notable for the following components:   Glucose-Capillary 322 (*)    All other components within normal limits  RESP PANEL BY RT-PCR (FLU A&B, COVID) ARPGX2  URINE CULTURE  CULTURE, BLOOD (ROUTINE X 2)  CULTURE, BLOOD (ROUTINE X 2)  CREATININE, URINE, RANDOM  SODIUM, URINE, RANDOM  OSMOLALITY, URINE  OSMOLALITY  MAGNESIUM  PHOSPHORUS  AMMONIA  LACTIC ACID, PLASMA  LACTIC ACID, PLASMA  HEMOGLOBIN A1C  PROCALCITONIN  MAGNESIUM  PHOSPHORUS  CBC WITH DIFFERENTIAL/PLATELET  TSH  COMPREHENSIVE METABOLIC PANEL    EKG None  Radiology CT HEAD WO CONTRAST  Result Date: 12/30/2020 CLINICAL DATA:  Head trauma. EXAM: CT HEAD WITHOUT CONTRAST TECHNIQUE: Contiguous axial images were obtained from the base of the skull through the vertex without intravenous contrast. COMPARISON:  Head CT 12/09/2020 FINDINGS: Brain: Mild motion artifact despite repeat acquisition. Stable degree of atrophy and chronic small vessel ischemia. No intracranial hemorrhage, mass effect, or midline shift. No hydrocephalus. The basilar cisterns are patent. No evidence of territorial infarct or acute ischemia. No extra-axial or intracranial fluid collection. Vascular: Atherosclerosis of skullbase vasculature without  US SCROTUM W/DOPPLER  Result Date: 12/30/2020 CLINICAL DATA:  Right testicle pain with fever EXAM: SCROTAL ULTRASOUND DOPPLER ULTRASOUND OF THE TESTICLES TECHNIQUE: Complete ultrasound examination of the testicles, epididymis, and other scrotal structures was performed. Color and spectral Doppler ultrasound were also utilized to evaluate blood flow to the testicles. COMPARISON:  None. FINDINGS: Right testicle Measurements: 4.7 x 2.1 x 4.1 cm. No mass or  microlithiasis visualized. Left testicle Measurements: 4.8 x 2.4 x 2.4 cm. No mass or microlithiasis visualized. Heterogeneous echotexture without discrete mass. Right epididymis:  Heterogenous and enlarged, slightly hyperemic. Left epididymis:  Normal in size and appearance. Hydrocele:  Small right-sided hydrocele. Varicocele:  None visualized. Pulsed Doppler interrogation of both testes demonstrates normal low resistance arterial and venous waveforms bilaterally. IMPRESSION: 1. Heterogenous, enlarged right epididymis with slight increased vascularity suspicious for epididymitis. 2. Slightly smaller diffusely heterogeneous left testis compared to the right, question mild atrophy and previous insult. Slightly asymmetric diminished flow to left testis but without definitive evidence for acute torsion. Electronically Signed   By: Donavan Foil M.D.   On: 12/30/2020 16:56    Procedures Procedures  Medications Ordered in the ED Medications  insulin aspart (novoLOG) injection 0-9 Units (7 Units Subcutaneous Given 12/30/20 2125)  amiodarone (PACERONE) tablet 200 mg (has no administration in time range)  budesonide (ENTOCORT EC) 24 hr capsule 9 mg (has no administration in time range)  folic acid (FOLVITE) tablet 1 mg (has no administration in time range)  metoprolol tartrate (LOPRESSOR) tablet 50 mg (50 mg Oral Given 12/30/20 2126)  tamsulosin (FLOMAX) capsule 0.4 mg (has no administration in time range)  0.9 %  sodium chloride infusion (75 mL/hr Intravenous New Bag/Given 12/30/20 2008)  acetaminophen (TYLENOL) tablet 650 mg (has no administration in  time range)    Or  acetaminophen (TYLENOL) suppository 650 mg (has no administration in time range)  HYDROcodone-acetaminophen (NORCO/VICODIN) 5-325 MG per tablet 1-2 tablet (has no administration in time range)  levofloxacin (LEVAQUIN) IVPB 500 mg (has no administration in time range)  apixaban (ELIQUIS) tablet 5 mg (5 mg Oral Given 12/30/20 2126)  feeding  supplement (ENSURE ENLIVE / ENSURE PLUS) liquid 237 mL (has no administration in time range)  insulin glargine (LANTUS) injection 15 Units (15 Units Subcutaneous Given 12/30/20 2153)  fentaNYL (SUBLIMAZE) injection 50 mcg (50 mcg Intravenous Given 12/30/20 1711)  levofloxacin (LEVAQUIN) IVPB 500 mg (0 mg Intravenous Stopped 12/30/20 1822)  acetaminophen (TYLENOL) tablet 650 mg (650 mg Oral Given 12/30/20 1819)  lactated ringers bolus 2,205 mL (0 mL/kg  73.5 kg Intravenous Stopped 12/30/20 1952)     MDM Rules/Calculators/A&P MDM Patient with testicular pain and swelling with reported fever, more likely to be epididymo-orchitis than torsion. Will check labs, including cultures. He was admitted for UTI, sepsis with E-coli in urine culture about 3 weeks ago treated with Ceftin.   ED Course  I have reviewed the triage vital signs and the nursing notes.  Pertinent labs & imaging results that were available during my care of the patient were reviewed by me and considered in my medical decision making (see chart for details).  Clinical Course as of 12/30/20 2328  Thu Dec 30, 2020  1718 [place] positive for epididymitis, will begin Levaquin.  [CS]  1806 CBC with leukocytosis higher than previous, anemia is improved at baseline.  CMP shows elevated glucose, otherwise unremarkable.  [CS]  9476 Patient reports pain is improved. Rectal temp is elevated, APAP ordered. Will discuss admission with the hospitalist.  [CS]  1814 Lactic acid is elevated, will give LR bolus, 30cc/kg.  [CS]  5465 UA confirms urinary source.  [CS]  0354 Covid/Flu are negative.  [CS]  6568 Spoke with Dr. Roel Cluck, Hospitalist, who requested I discuss with Urology given his penile implant. His reservoir is in his left hemiscrotum, not tender to palpation.  [CS]  1847 Per Dr. Louis Meckel, Urology, nothing emergent to do about the penile implant, but he does need outpatient Urology follow up at discharge. He is OK with Levaquin.  [CS]     Clinical Course User Index [CS] Truddie Hidden, MD    Final Clinical Impression(s) / ED Diagnoses Final diagnoses:  Pain    Rx / DC Orders ED Discharge Orders    None       Truddie Hidden, MD 12/30/20 2328

## 2020-12-30 NOTE — ED Notes (Signed)
RN notified of abnormal lab 

## 2020-12-30 NOTE — Telephone Encounter (Signed)
Left message x 2 for [facility] with Dr. Melvern Banker office to let our office know if Dr. Ky Barban is ok with holding Eliquis x 1 day prior to procedure. If needing to hold Eliquis x 2 days or more will then need to review with primary cardiologist.

## 2020-12-30 NOTE — ED Triage Notes (Signed)
Patient BIB daughter, reports positive for testicular torsion from [place] clinic. Reports testicle pain and fever x3 days.

## 2020-12-30 NOTE — Progress Notes (Signed)
Pharmacy Antibiotic Note  Tommy Peterson is a 80 y.o. male admitted on 12/30/2020 with UTI, epididymitis.  Pharmacy has been consulted for levofloxacin dosing.  ED Antibiotics: Levofloxacin 500 mg IV x 1 on 6/26 1737  Significant Drug Interaction: Levofloxacin with amiodarone - Concomitant use may result in additive effects on the QT interval and increase the risk of cardiac adverse events, including arrhythmia and torsade de pointes. The elderly and patients with risk factors for torsade de pointes (known QT prolongation, uncorrected hypokalemia) may be more susceptible to QT interval effects.    Plan: Levofloxacin 500 mg IV daily  Monitor for QT prolongation and correct hypokalemia and/or hypomagnesemia while on levofloxacin & amiodarone concurrently Monitor clinical picture and renal function F/U C&S, abx deescalation / LOT      Temp (24hrs), Avg:100.7 F (38.2 C), Min:98.2 F (36.8 C), Max:103.2 F (39.6 C)  Recent Labs  Lab 12/30/20 1715  WBC 18.0*  CREATININE 0.58*  LATICACIDVEN 3.2*    Estimated Creatinine Clearance: 76 mL/min (A) (by C-G formula based on SCr of 0.58 mg/dL (L)).    Allergies  Allergen Reactions  . Bee Venom Swelling and Anaphylaxis  . Metformin And Related Nausea And Vomiting  . Sulfamethoxazole-Trimethoprim Nausea And Vomiting  . Bee Pollen Other (See Comments)    Unknown   . Metformin Hcl Other (See Comments)    sick     Antimicrobials this admission: 5/26 Levofloxacin  >>   Dose adjustments this admission:   Microbiology results: 5/26 BCx: sent 5/26 UCx: sent   Thank you for allowing pharmacy to be a part of this patient's care.  Trenell Concannon P. Legrand Como, PharmD, [place] Please utilize Amion for appropriate phone number to reach the unit pharmacist ([place]) 12/30/2020 8:07 PM

## 2020-12-31 ENCOUNTER — Other Ambulatory Visit: Payer: Self-pay

## 2020-12-31 ENCOUNTER — Inpatient Hospital Stay: Payer: Medicare HMO

## 2020-12-31 DIAGNOSIS — I110 Hypertensive heart disease with heart failure: Secondary | ICD-10-CM | POA: Diagnosis not present

## 2020-12-31 DIAGNOSIS — W19XXXA Unspecified fall, initial encounter: Secondary | ICD-10-CM | POA: Diagnosis present

## 2020-12-31 DIAGNOSIS — N390 Urinary tract infection, site not specified: Secondary | ICD-10-CM | POA: Diagnosis not present

## 2020-12-31 DIAGNOSIS — D539 Nutritional anemia, unspecified: Secondary | ICD-10-CM | POA: Diagnosis present

## 2020-12-31 DIAGNOSIS — A419 Sepsis, unspecified organism: Secondary | ICD-10-CM | POA: Diagnosis present

## 2020-12-31 DIAGNOSIS — A4151 Sepsis due to Escherichia coli [E. coli]: Secondary | ICD-10-CM | POA: Diagnosis not present

## 2020-12-31 DIAGNOSIS — R6520 Severe sepsis without septic shock: Secondary | ICD-10-CM | POA: Diagnosis present

## 2020-12-31 DIAGNOSIS — E1165 Type 2 diabetes mellitus with hyperglycemia: Secondary | ICD-10-CM | POA: Diagnosis not present

## 2020-12-31 DIAGNOSIS — Z7901 Long term (current) use of anticoagulants: Secondary | ICD-10-CM | POA: Diagnosis not present

## 2020-12-31 DIAGNOSIS — G9341 Metabolic encephalopathy: Secondary | ICD-10-CM

## 2020-12-31 DIAGNOSIS — E860 Dehydration: Secondary | ICD-10-CM | POA: Diagnosis present

## 2020-12-31 DIAGNOSIS — R000 Tachycardia, unspecified: Secondary | ICD-10-CM | POA: Diagnosis present

## 2020-12-31 DIAGNOSIS — R4701 Aphasia: Secondary | ICD-10-CM | POA: Diagnosis not present

## 2020-12-31 DIAGNOSIS — N451 Epididymitis: Secondary | ICD-10-CM

## 2020-12-31 DIAGNOSIS — E119 Type 2 diabetes mellitus without complications: Secondary | ICD-10-CM

## 2020-12-31 DIAGNOSIS — Z794 Long term (current) use of insulin: Secondary | ICD-10-CM

## 2020-12-31 DIAGNOSIS — E871 Hypo-osmolality and hyponatremia: Secondary | ICD-10-CM | POA: Diagnosis not present

## 2020-12-31 DIAGNOSIS — Z888 Allergy status to other drugs, medicaments and biological substances status: Secondary | ICD-10-CM | POA: Diagnosis not present

## 2020-12-31 DIAGNOSIS — K52831 Collagenous colitis: Secondary | ICD-10-CM

## 2020-12-31 DIAGNOSIS — N400 Enlarged prostate without lower urinary tract symptoms: Secondary | ICD-10-CM | POA: Diagnosis present

## 2020-12-31 DIAGNOSIS — Z20822 Contact with and (suspected) exposure to covid-19: Secondary | ICD-10-CM | POA: Diagnosis not present

## 2020-12-31 DIAGNOSIS — R296 Repeated falls: Secondary | ICD-10-CM | POA: Diagnosis present

## 2020-12-31 DIAGNOSIS — I480 Paroxysmal atrial fibrillation: Secondary | ICD-10-CM | POA: Diagnosis not present

## 2020-12-31 DIAGNOSIS — K529 Noninfective gastroenteritis and colitis, unspecified: Secondary | ICD-10-CM | POA: Diagnosis present

## 2020-12-31 DIAGNOSIS — S0990XA Unspecified injury of head, initial encounter: Secondary | ICD-10-CM | POA: Diagnosis present

## 2020-12-31 DIAGNOSIS — D472 Monoclonal gammopathy: Secondary | ICD-10-CM | POA: Diagnosis present

## 2020-12-31 DIAGNOSIS — I10 Essential (primary) hypertension: Secondary | ICD-10-CM

## 2020-12-31 DIAGNOSIS — Z87440 Personal history of urinary (tract) infections: Secondary | ICD-10-CM | POA: Diagnosis not present

## 2020-12-31 DIAGNOSIS — X58XXXA Exposure to other specified factors, initial encounter: Secondary | ICD-10-CM | POA: Diagnosis present

## 2020-12-31 DIAGNOSIS — I5033 Acute on chronic diastolic (congestive) heart failure: Secondary | ICD-10-CM | POA: Diagnosis not present

## 2020-12-31 DIAGNOSIS — C91Z0 Other lymphoid leukemia not having achieved remission: Secondary | ICD-10-CM | POA: Diagnosis not present

## 2020-12-31 LAB — BLOOD CULTURE ID PANEL (REFLEXED) - BCID2

## 2020-12-31 LAB — CBC WITH DIFFERENTIAL/PLATELET
Abs Immature Granulocytes: 0.15 10*3/uL — ABNORMAL HIGH (ref 0.00–0.07)
Basophils Absolute: 0 10*3/uL (ref 0.0–0.1)
Basophils Relative: 0 %
Eosinophils Absolute: 0 10*3/uL (ref 0.0–0.5)
Eosinophils Relative: 0 %
HCT: 32.3 % — ABNORMAL LOW (ref 39.0–52.0)
Hemoglobin: 10.8 g/dL — ABNORMAL LOW (ref 13.0–17.0)
Immature Granulocytes: 1 %
Lymphocytes Relative: 29 %
Lymphs Abs: 3.7 10*3/uL (ref 0.7–4.0)
MCH: 37.8 pg — ABNORMAL HIGH (ref 26.0–34.0)
MCHC: 33.4 g/dL (ref 30.0–36.0)
MCV: 112.9 fL — ABNORMAL HIGH (ref 80.0–100.0)
Monocytes Absolute: 1.3 10*3/uL — ABNORMAL HIGH (ref 0.1–1.0)
Monocytes Relative: 11 %
Neutro Abs: 7.4 10*3/uL (ref 1.7–7.7)
Neutrophils Relative %: 59 %
Platelets: 121 10*3/uL — ABNORMAL LOW (ref 150–400)
RBC: 2.86 MIL/uL — ABNORMAL LOW (ref 4.22–5.81)
WBC: 12.6 10*3/uL — ABNORMAL HIGH (ref 4.0–10.5)
nRBC: 0 % (ref 0.0–0.2)

## 2020-12-31 LAB — COMPREHENSIVE METABOLIC PANEL
ALT: 30 U/L (ref 0–44)
AST: 20 U/L (ref 15–41)
Albumin: 2.9 g/dL — ABNORMAL LOW (ref 3.5–5.0)
Alkaline Phosphatase: 62 U/L (ref 38–126)
Anion gap: 7 (ref 5–15)
BUN: 12 mg/dL (ref 8–23)
CO2: 27 mmol/L (ref 22–32)
Calcium: 8.6 mg/dL — ABNORMAL LOW (ref 8.9–10.3)
Chloride: 101 mmol/L (ref 98–111)
Creatinine, Ser: 0.49 mg/dL — ABNORMAL LOW (ref 0.61–1.24)
GFR, Estimated: >60 mL/min (ref >60)
Glucose, Bld: 84 mg/dL (ref 70–99)
Potassium: 4.3 mmol/L (ref 3.5–5.1)
Sodium: 135 mmol/L (ref 135–145)
Total Bilirubin: 0.8 mg/dL (ref 0.3–1.2)
Total Protein: 5.5 g/dL — ABNORMAL LOW (ref 6.5–8.1)

## 2020-12-31 LAB — HEMOGLOBIN A1C
Hgb A1c MFr Bld: 9.1 % — ABNORMAL HIGH (ref 4.8–5.6)
Mean Plasma Glucose: 214 mg/dL

## 2020-12-31 LAB — GLUCOSE, CAPILLARY
Glucose-Capillary: 129 mg/dL — ABNORMAL HIGH (ref 70–99)
Glucose-Capillary: 280 mg/dL — ABNORMAL HIGH (ref 70–99)
Glucose-Capillary: 341 mg/dL — ABNORMAL HIGH (ref 70–99)
Glucose-Capillary: 345 mg/dL — ABNORMAL HIGH (ref 70–99)
Glucose-Capillary: 83 mg/dL (ref 70–99)
Glucose-Capillary: 85 mg/dL (ref 70–99)

## 2020-12-31 LAB — PHOSPHORUS: Phosphorus: 3.9 mg/dL (ref 2.5–4.6)

## 2020-12-31 LAB — TSH: TSH: 0.826 u[IU]/mL (ref 0.350–4.500)

## 2020-12-31 LAB — MAGNESIUM: Magnesium: 1.8 mg/dL (ref 1.7–2.4)

## 2020-12-31 LAB — PROCALCITONIN: Procalcitonin: <0.1 ng/mL

## 2020-12-31 IMAGING — US US SCROTUM W/ DOPPLER COMPLETE
1 series · 15 of 25 positions shown · non-contrast
Comparison: None.

CLINICAL DATA: Right testicle pain with fever

EXAM:
SCROTAL ULTRASOUND
DOPPLER ULTRASOUND OF THE TESTICLES
TECHNIQUE: Complete ultrasound examination of the testicles, epididymis, and
other scrotal structures was performed. Color and spectral Doppler
ultrasound were also utilized to evaluate blood flow to the
testicles.

[Series 1: us scrotum mc & wl · 15 of 44 slices shown]
[im 1/44]
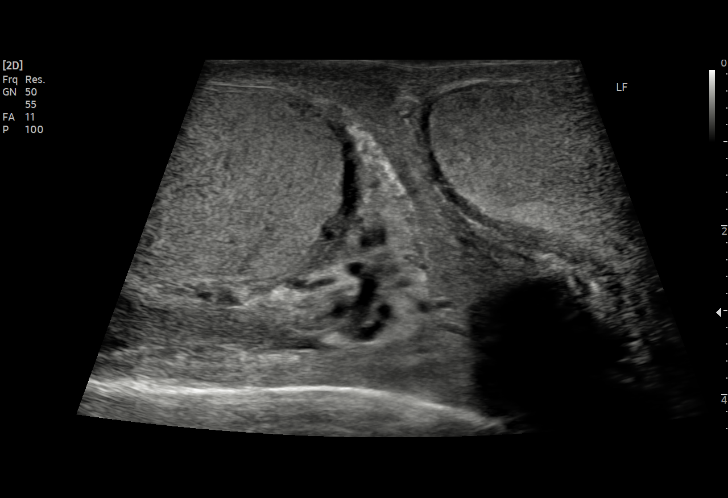
[im 4/44]
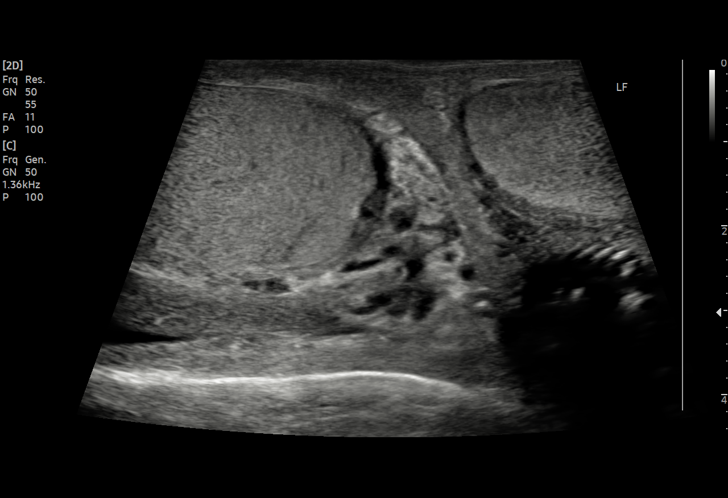
[im 8/44]
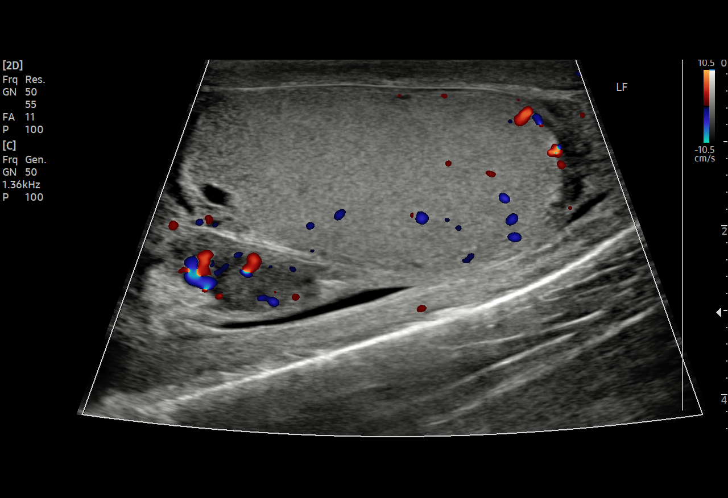
[im 9/44]
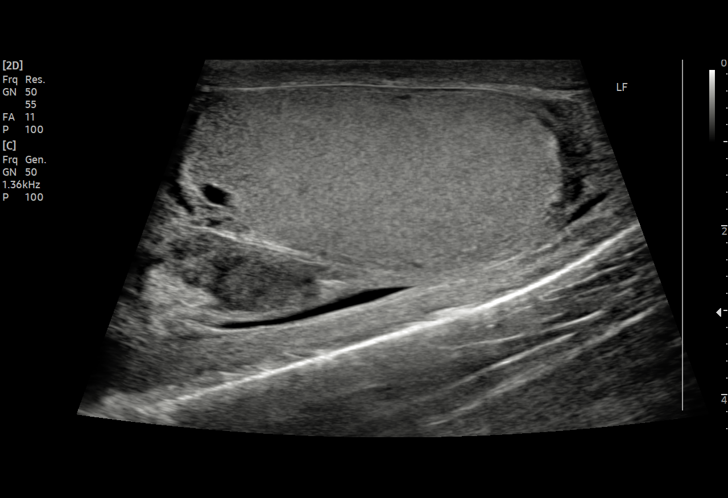
[im 13/44]
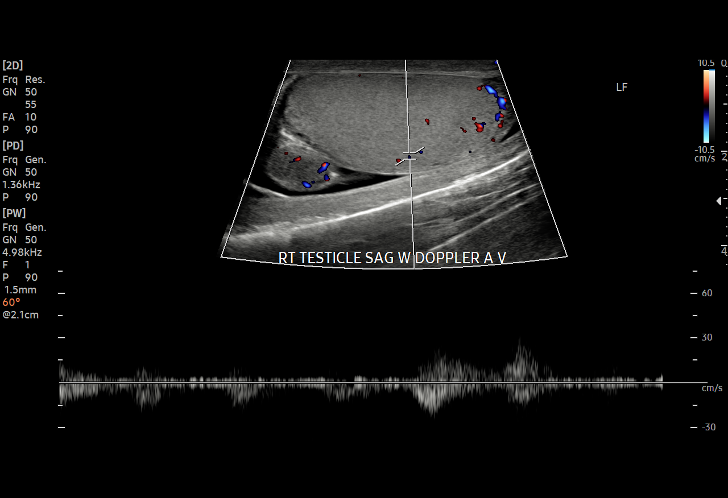
[im 17/44]
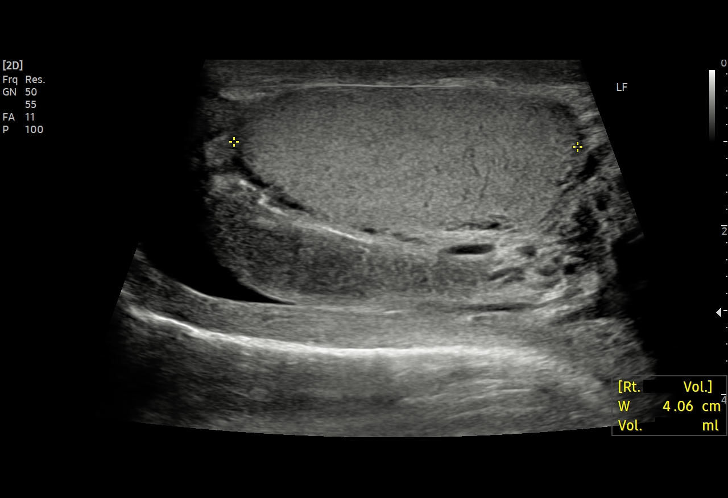
[im 18/44]
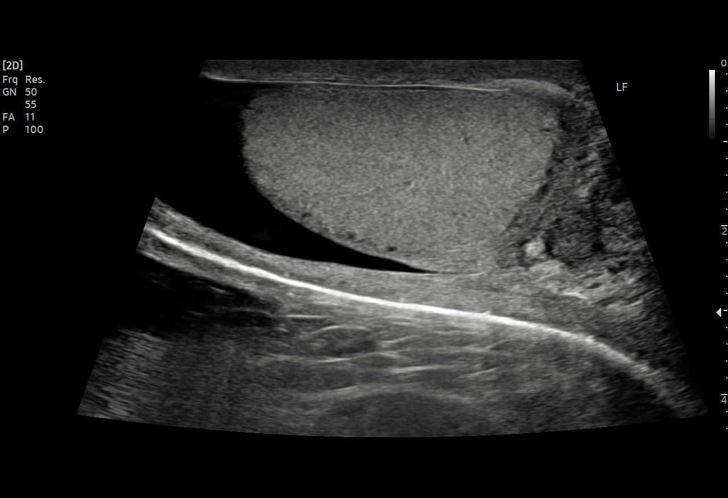
[im 22/44]
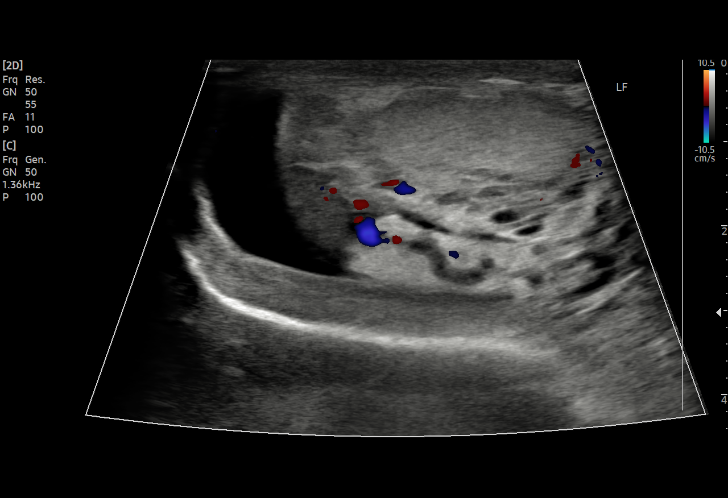
[im 26/44]
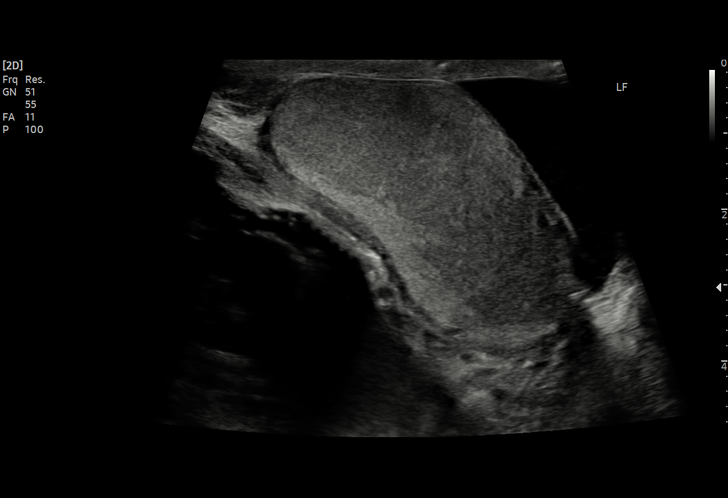
[im 27/44]
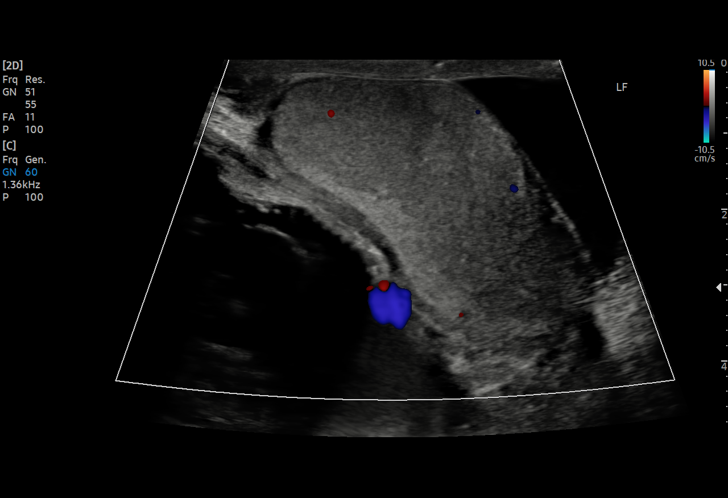
[im 31/44]
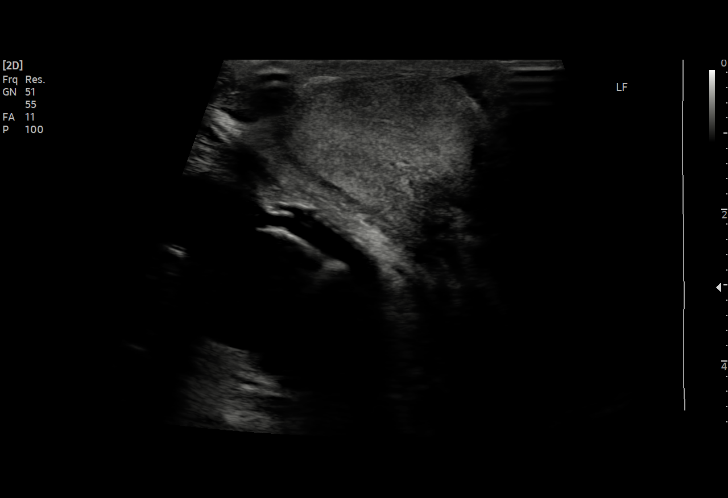
[im 35/44]
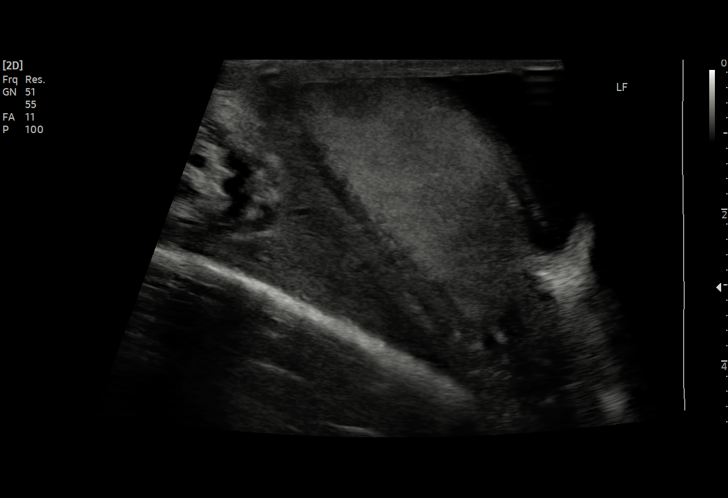
[im 36/44]
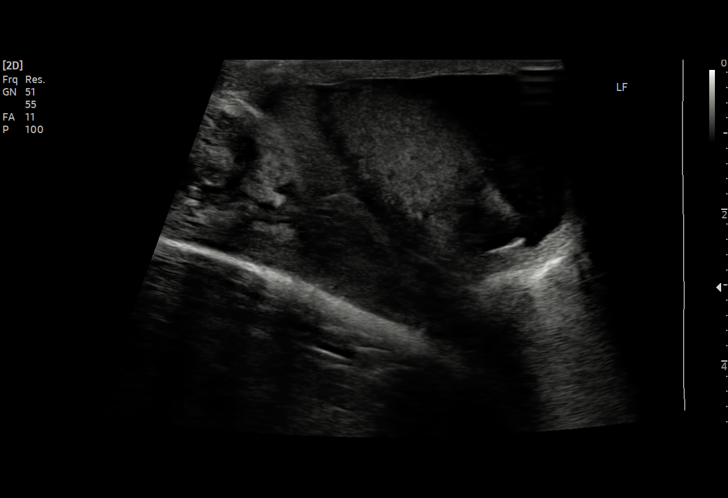
[im 40/44]
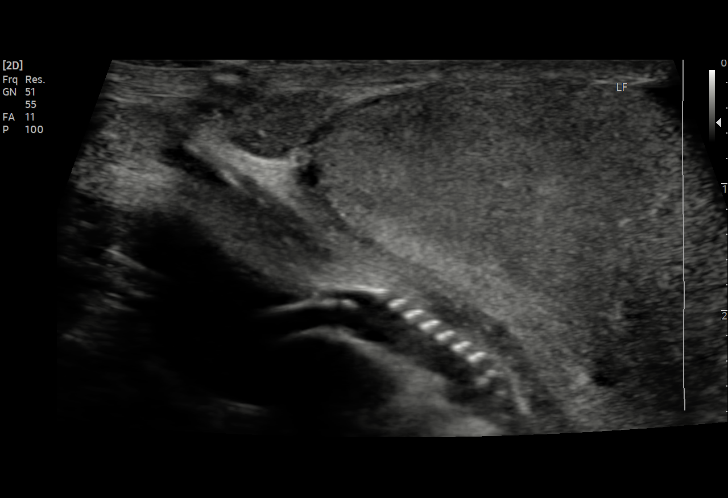
[im 44/44]
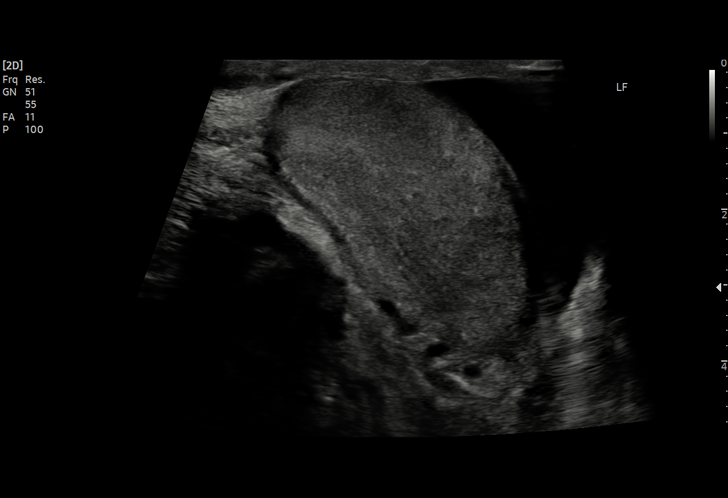

[15 of 25 positions shown; findings below may reference images not displayed]

FINDINGS: Right testicle

Measurements: 4.7 x 2.1 x 4.1 cm. No mass or microlithiasis
visualized.

Left testicle

Measurements: 4.8 x 2.4 x 2.4 cm. No mass or microlithiasis
visualized. Heterogeneous echotexture without discrete mass.

Right epididymis:  Heterogenous and enlarged, slightly hyperemic.

Left epididymis:  Normal in size and appearance.

Hydrocele:  Small right-sided hydrocele.

Varicocele:  None visualized.

Pulsed Doppler interrogation of both testes demonstrates normal low
resistance arterial and venous waveforms bilaterally.
IMPRESSION: 1. Heterogenous, enlarged right epididymis with slight increased
vascularity suspicious for epididymitis.
2. Slightly smaller diffusely heterogeneous left testis compared to
the right, question mild atrophy and previous insult. Slightly
asymmetric diminished flow to left testis but without definitive
evidence for acute torsion.

## 2020-12-31 MED ORDER — SODIUM CHLORIDE 0.9 % IV SOLN
2.0000 g | INTRAVENOUS | Status: DC
  Administered 2020-12-31 – 2021-01-01 (×2): 2 g via INTRAVENOUS
  Filled 2020-12-31 (×2): qty 20
  Filled 2020-12-31: qty 2

## 2020-12-31 NOTE — Progress Notes (Signed)
Pt alert and aware sitting up in chair at bedside. He states he is doing okay. The doctors are still check up on stuff and he may get to go home tomorrow. The chaplain offered caring and supportive presence, prayers and blessings.

## 2020-12-31 NOTE — Evaluation (Signed)
Physical Therapy Evaluation Patient Details Name: Tommy Peterson MRN: [id number] DOB: 1940/12/17 Today's Date: 12/31/2020   History of Present Illness  Patient is an 80 year old male with Epididymitis and UTI. PMH signficant for Large Granular Lymphocytic leukemia chronic leukocytosis, macrocytic anemia, MGUS, recurent UTI, DM2. chornic diarrhea, a.fib on eliquis diastolic heart failure  Clinical Impression  Pt admitted with above diagnosis.  Pt currently with functional limitations due to the deficits listed below (see PT Problem List). Pt will benefit from skilled PT to increase their independence and safety with mobility to allow discharge to the venue listed below.  Pt awake/alert and appears to answer questions appropriately however cognition does not appear completely intact.  Pt mobilizing with min/guard assist for safety and reports recent falls due to syncopal episodes however no dizziness or symptoms with mobility at time of evaluation.   Recommend initial 24/7 supervision for safety and HHPT upon d/c.     Follow Up Recommendations Home health PT;Supervision/Assistance - 24 hour    Equipment Recommendations  None recommended by PT    Recommendations for Other Services       Precautions / Restrictions Precautions Precautions: Fall Precaution Comments: hx of falls Restrictions Weight Bearing Restrictions: No      Mobility  Bed Mobility Overal bed mobility: Needs Assistance Bed Mobility: Supine to Sit     Supine to sit: Max assist;HOB elevated     General bed mobility comments: pt in recliner    Transfers Overall transfer level: Needs assistance Equipment used: Rolling walker (2 wheeled) Transfers: Sit to/from Stand Sit to Stand: Min guard   Squat pivot transfers: Max assist     General transfer comment: verbal cues for hand placement and safety  Ambulation/Gait Ambulation/Gait assistance: Min guard Gait Distance (Feet): 200 Feet Assistive device:  Rolling walker (2 wheeled) Gait Pattern/deviations: Step-through pattern;Decreased stride length;Trunk flexed     General Gait Details: verbal cues for posture and distance from RW, pt appears steady when using RW  Stairs            Wheelchair Mobility    Modified Rankin (Stroke Patients Only)       Balance Overall balance assessment: Needs assistance;History of Falls Sitting-balance support: Feet supported;Bilateral upper extremity supported Sitting balance-Leahy Scale: Poor     Standing balance support: Bilateral upper extremity supported Standing balance-Leahy Scale: Poor Standing balance comment: reliant on UE support                             Pertinent Vitals/Pain Pain Assessment: No/denies pain Faces Pain Scale: Hurts even more Pain Location: would not specify when asked where it is hurting Pain Descriptors / Indicators: Grimacing;Moaning Pain Intervention(s): Monitored during session    Home Living Family/patient expects to be discharged to:: Private residence Living Arrangements: Children (2 daughters) Available Help at Discharge: Family;Available 24 hours/day Type of Home: House Home Access: Stairs to enter Entrance Stairs-Rails: Right Entrance Stairs-Number of Steps: 3 and 6 (his typical way) Home Layout: Able to live on main level with bedroom/bathroom;Laundry or work area in [place]: Environmental consultant - 2 wheels;Grab bars - toilet;Grab bars - tub/shower Additional Comments: information obtained from prior admission/PT eval as patient unable to provide hx during OT eval    Prior Function Level of Independence: Independent with assistive device(s)         Comments: pt has been using RW lately     Hand Dominance   Dominant  Hand: Left    Extremity/Trunk Assessment   Upper Extremity Assessment Upper Extremity Assessment: Generalized weakness    Lower Extremity Assessment Lower Extremity Assessment: Generalized weakness     Cervical / Trunk Assessment Cervical / Trunk Assessment: Kyphotic  Communication   Communication: HOH  Cognition Arousal/Alertness: Awake/alert Behavior During Therapy: WFL for tasks assessed/performed Overall Cognitive Status: No family/caregiver present to determine baseline cognitive functioning                                 General Comments: appears pleasant and able to answer questions however cognition does not appear intact; RN reports likely due to UTI      General Comments General comments (skin integrity, edema, etc.): Pt reports falls recently due to syncope episodes    Exercises     Assessment/Plan    PT Assessment Patient needs continued PT services  PT Problem List Decreased strength;Decreased activity tolerance;Decreased mobility;Decreased knowledge of use of DME;Decreased balance;Decreased cognition       PT Treatment Interventions DME instruction;Gait training;Stair training;Functional mobility training;Therapeutic activities;Therapeutic exercise;Balance training;Cognitive remediation;Patient/family education    PT Goals (Current goals can be found in the Care Plan section)  Acute Rehab PT Goals Patient Stated Goal: "I need water" PT Goal Formulation: With patient Time For Goal Achievement: 01/14/21 Potential to Achieve Goals: Good    Frequency Min 3X/week   Barriers to discharge        Co-evaluation               AM-PAC PT "6 Clicks" Mobility  Outcome Measure Help needed turning from your back to your side while in a flat bed without using bedrails?: A Little Help needed moving from lying on your back to sitting on the side of a flat bed without using bedrails?: A Little Help needed moving to and from a bed to a chair (including a wheelchair)?: A Little Help needed standing up from a chair using your arms (e.g., wheelchair or bedside chair)?: A Little Help needed to walk in hospital room?: A Little Help needed climbing 3-5  steps with a railing? : A Lot 6 Click Score: 17    End of Session Equipment Utilized During Treatment: Gait belt Activity Tolerance: Patient tolerated treatment well Patient left: in chair;with call bell/phone within reach;with chair alarm set Nurse Communication: Mobility status PT Visit Diagnosis: Difficulty in walking, not elsewhere classified (R26.2);History of falling (Z91.81)    Time: 1610-9604 PT Time Calculation (min) (ACUTE ONLY): 20 min   Jannette Spanner PT, DPT Acute Rehabilitation Services Pager: [phone] Office: [phone]  York Ram E 12/31/2020, 3:07 PM

## 2020-12-31 NOTE — Progress Notes (Signed)
PHARMACY NOTE -  [facility] has been assisting with dosing of levofloxacin for epididymitis.  Dosage remains stable at 500 mg IV q24h and further renal adjustments per institutional Pharmacy antibiotic protocol  Pharmacy will sign off, following peripherally for culture results or dose adjustments. Please reconsult if a change in clinical status warrants re-evaluation of dosage.  Reuel Boom, PharmD, BCPS [phone] 12/31/2020, 10:40 AM

## 2020-12-31 NOTE — Telephone Encounter (Signed)
Please OK to hold Eliquis for 2 day pre- and 1 day post procedure.

## 2020-12-31 NOTE — Plan of Care (Signed)
  Problem: Education: Goal: Knowledge of General Education information will improve Description: Including pain rating scale, medication(s)/side effects and non-pharmacologic comfort measures Outcome: Progressing   Problem: Clinical Measurements: Goal: Will remain free from infection Outcome: Progressing Goal: Diagnostic test results will improve Outcome: Progressing   

## 2020-12-31 NOTE — Evaluation (Signed)
Clinical/Bedside Swallow Evaluation Patient Details  Name: Tommy Peterson MRN: [id number] Date of Birth: [birth year]  Today's Date: 12/31/2020 Time: SLP Start Time (ACUTE ONLY): 0815 SLP Stop Time (ACUTE ONLY): 0835 SLP Time Calculation (min) (ACUTE ONLY): 20 min  Past Medical History:  Past Medical History:  Diagnosis Date  . Acute respiratory failure ([place])   . BCC (basal cell carcinoma of skin) 08/15/2016   w/SCC Left Forehead (treatment @ [facility])  . BPH (benign prostatic hyperplasia)   . Carpal tunnel syndrome on both sides   . Chronic diarrhea    intermittant due to chronic collagenous colitis  . Chronic large granular lymphocytic leukemia ([place]) primary hemotologist-  [name]. 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 Collagenous colitis    chronic--- intermittant between diarrahea and constipation  . ED (erectile dysfunction)   . HCAP (healthcare-associated pneumonia)   . History of adenomatous polyp of colon   . History of Clostridium difficile colitis 05/2016  . History of pneumonia 11/08/2017   CAP -- [facility]---  12-19-2017  per pt no residual symptoms  . History of SCC (squamous cell carcinoma) of skin   . History of sepsis    11-20-2017 severe sepsis due to UTI;  10/ 2017sepsis due to c-diff colitis  . Leucocytosis    chronic  . Lower urinary tract symptoms (LUTS)   . Macrocytic anemia    since 02/ 2015  . MGUS (monoclonal gammopathy of unknown significance)    hemotologist-  [name] @ [facility]  . Monoclonal paraproteinemia   . Neuropathy, peripheral   . Nodular basal cell carcinoma (BCC) 10/01/2018   Left Neck(Nodular) Pt Does Not Want Treatment  . Nodular basal cell carcinoma (BCC) 10/01/2018   Mid Forehead (treament curet and excision)  .  OA (osteoarthritis)   . Prostatic stone   . Rash of face    RIGHT SIDE  . Raynaud's syndrome   . Recurrent BCC (basal cell carcinoma) 03/05/2019   positive margin  . SCC (squamous cell carcinoma) 08/10/2009   Central Forehead (Moh's Dr. Rhona Raider @ [facility])  . SCC (squamous cell carcinoma) 12/08/2011   CIS-Left Cheek (treatment Aldara @ [facility])  . SCC (squamous cell carcinoma) 07/09/2015   CIS-Left Forehead (treament @ [facility])  . Septic shock (HCC)    x 3 w/UTI  . Squamous cell carcinoma in situ (SCCIS) 12/08/2011   Left Cheek (treatment Aldara @ [facility])   . Superficial basal cell carcinoma (BCC) 10/01/2018   Left Shin-Pt does not want treatment  . Thin skin    fragile  . Transfusion-dependent anemia since 10/ 2017   last transfusion PRBCs 07-19-2017  per hemologist note dated 10-25-2017  . Type 2 diabetes mellitus treated with insulin ([place])    followed by [name] (pcp)  . Urinary hesitancy   . Wears contact lenses    LEFT EYE ONLY   Past Surgical History:  Past Surgical History:  Procedure Laterality Date  . BONE MARROW BIOPSY Right 05-24-2016;  08-20-2014  . CARDIOVASCULAR STRESS TEST  11-26-2012   [name]  @ [place]   normal nuclear study w/ no ischemia/  normal LV function and wall motion , ef 60% (find in care everywhere,epic)  . CARDIOVERSION N/A 08/31/2020   Procedure: CARDIOVERSION;  Surgeon: [name], MD;  Location: [place];  Service: Cardiovascular;  Laterality: N/A;  . CATARACT EXTRACTION W/ INTRAOCULAR LENS  IMPLANT, BILATERAL  2015  . CYSTOSCOPY WITH LITHOLAPAXY N/A 12/25/2017   Procedure: CYSTOSCOPY WITH LITHOLAPAXY AND FULGERATION;  Surgeon: [name], MD;  Location: [facility];  Service: Urology;  Laterality: N/A;  . INGUINAL HERNIA REPAIR Right 1980s  . LOOP RECORDER INSERTION N/A 04/10/2018   Procedure: LOOP RECORDER INSERTION;  Surgeon: [name], MD;  Location: [facility];   Service: Cardiovascular;  Laterality: N/A;  . PENILE PROSTHESIS IMPLANT  12-02-2015   dr [name] @ [facility] in [place]  . SHOULDER SURGERY Left 08/2016  . TEE WITHOUT CARDIOVERSION N/A 08/31/2020   Procedure: TRANSESOPHAGEAL ECHOCARDIOGRAM (TEE);  Surgeon: [name], MD;  Location: [place];  Service: Cardiovascular;  Laterality: N/A;  . TONSILLECTOMY  child  . TRANSURETHRAL RESECTION OF PROSTATE  2009   AND REPAIR RECURRENT RIGHT INGUINAL HERNIA   HPI:  80 y.o. male adm with painful urination and fall two weeks ago. PMH + for Large Granular Lymphocytic leukemia chronic leukocytosis, macrocytic anemia, MGUS, recurent UTI, DM2. chornic diarrhea, a.fib on eliquis diastolic heart failure admit with AMS, fever and .  CT head negative for acute finding.  Esophagram 07/19/2020 Pt has h/o Short segment of moderate smooth circumferential luminal  narrowing in the lower thoracic esophagus near the esophagogastric  junction, at which location the barium tablet became transiently  lodged, suggestive of a mild lower thoracic esophageal stricture  without overtly malignant features. No discrete esophageal mass.  This correlates to a site of circumferential wall thickening in the  lower thoracic esophagus seen on 05/14/2020 CT abdomen study.  2. Moderate esophageal dysmotility, with a pattern characteristic of  presbyesophagus.  3. Tiny sliding hiatal hernia.  No gastroesophageal reflux elicited.  4. Transient laryngeal penetration observed, without  tracheobronchial aspiration. Mild barium retention in the vallecula.  Swallow evaluation ordered.   Assessment / Plan / Recommendation Clinical Impression  Pt sleepy but participative during evaluation and agreeable to accept minimal intake.  Pt with generalized weakness but no focal CN deficits.  He was observed with intake of thin water x4 ounces, gingerale x2 ounces, applesauce x2 bites, and single bite of graham cracker. He benefited from  hand over hand assist to hold his cup due to weakness.  Prolonged mastication noted with solid but pt adequately cleared bolus. No overt indication of aspiration or dysphagia with minimal intake pt accepted. Pt has h/o esophageal dysmotility and potential esophageal narrowing per esophagram 07/2020, thus recommend generalized esophageal precautions. Baseline dry cough observed that also continued during intake - but did not appear coorelated to swallowing.  No SLP follow up indicated as oropharyngeal swallow clinically is functional with expected improvement as pt medically progresses. SLP Visit Diagnosis: Dysphagia, unspecified (R13.10)    Aspiration Risk  Mild aspiration risk    Diet Recommendation Regular;Thin liquid   Liquid Administration via: Cup;Straw Medication Administration: Whole meds with liquid Supervision: Patient able to self feed Compensations: Slow rate;Small sips/bites Postural Changes: Seated upright at 90 degrees;Remain upright for at least 30 minutes after po intake    Other  Recommendations Oral Care Recommendations: Oral care BID   Follow up Recommendations None      Frequency and Duration     n/a       Prognosis   n/a     Swallow Study   General Date of Onset: 12/31/20  HPI: 80 y.o. male adm with painful urination and fall two weeks ago. PMH + for Large Granular Lymphocytic leukemia chronic leukocytosis, macrocytic anemia, MGUS, recurent UTI, DM2. chornic diarrhea, a.fib on eliquis diastolic heart failure admit with AMS, fever and .  CT head negative for acute finding.  Esophagram 07/19/2020 Pt has h/o Short segment of moderate smooth circumferential luminal  narrowing in the lower thoracic esophagus near the esophagogastric  junction, at which location the barium tablet became transiently  lodged, suggestive of a mild lower thoracic esophageal stricture  without overtly malignant features. No discrete esophageal mass.  This correlates to a site of circumferential  wall thickening in the  lower thoracic esophagus seen on 05/14/2020 CT abdomen study.  2. Moderate esophageal dysmotility, with a pattern characteristic of  presbyesophagus.  3. Tiny sliding hiatal hernia.  No gastroesophageal reflux elicited.  4. Transient laryngeal penetration observed, without  tracheobronchial aspiration. Mild barium retention in the vallecula.  Swallow evaluation ordered. Type of Study: Bedside Swallow Evaluation Previous Swallow Assessment: see HPI Diet Prior to this Study: Regular;Thin liquids Temperature Spikes Noted: No Respiratory Status: Room air History of Recent Intubation: No Behavior/Cognition: Lethargic/Drowsy Oral Cavity Assessment: Within Functional Limits Oral Care Completed by SLP: No Oral Cavity - Dentition: Adequate natural dentition Vision: Functional for self-feeding Self-Feeding Abilities: Needs assist Patient Positioning: Upright in bed Baseline Vocal Quality: Normal Volitional Cough: Weak    Oral/Motor/Sensory Function Overall Oral Motor/Sensory Function: Generalized oral weakness   Ice Chips Ice chips: Not tested   Thin Liquid Thin Liquid: Within functional limits Presentation: Straw    Nectar Thick Nectar Thick Liquid: Not tested   Honey Thick Honey Thick Liquid: Not tested   Puree Puree: Impaired Presentation: Self Fed Oral Phase Impairments: Reduced lingual movement/coordination Oral Phase Functional Implications: Prolonged oral transit Pharyngeal Phase Impairments: Suspected delayed Swallow   Solid     Solid: Impaired Presentation: Self Fed Oral Phase Impairments: Impaired mastication;Other (comment) (due to lethargy)      Macario Golds 12/31/2020,9:56 AM  [name] Lime, MS [place] Office [phone] Pager [phone]

## 2020-12-31 NOTE — Telephone Encounter (Signed)
Mandy from Dr. Melvern Banker office called back.  1 day off Eliquis is not enough, per Dr. Ky Barban.  He is asking for 2 days off prior to procedure and possible 1 day after.  Mandy aware that I will send this back to the preop pool to address and when they decided on clearance, they will forward it back to their office.

## 2020-12-31 NOTE — Progress Notes (Signed)
PHARMACY - PHYSICIAN COMMUNICATION CRITICAL VALUE ALERT - BLOOD CULTURE IDENTIFICATION (BCID)  Tommy Peterson is an 80 y.o. male who presented to [facility] on 12/30/2020 with a chief complaint of pain with urination, fever, scrotal pain and swelling.  Assessment:  1 of 4 bottles E.coli, sepsis due to epididymitis  Name of physician (or Provider) Contacted: Hollace Hayward, NP  Current antibiotics: levofloxacin  Changes to prescribed antibiotics recommended:  Recommendations accepted by provider - change to ceftriaxone 2g IV q24h  Results for orders placed or performed during the hospital encounter of 12/30/20  Blood Culture ID Panel (Reflexed) (Collected: 12/30/2020  5:15 PM)  Result Value Ref Range   Enterococcus faecalis NOT DETECTED NOT DETECTED   Enterococcus Faecium NOT DETECTED NOT DETECTED   Listeria monocytogenes NOT DETECTED NOT DETECTED   Staphylococcus species NOT DETECTED NOT DETECTED   Staphylococcus aureus (BCID) NOT DETECTED NOT DETECTED   Staphylococcus epidermidis NOT DETECTED NOT DETECTED   Staphylococcus lugdunensis NOT DETECTED NOT DETECTED   Streptococcus species NOT DETECTED NOT DETECTED   Streptococcus agalactiae NOT DETECTED NOT DETECTED   Streptococcus pneumoniae NOT DETECTED NOT DETECTED   Streptococcus pyogenes NOT DETECTED NOT DETECTED   A.calcoaceticus-baumannii NOT DETECTED NOT DETECTED   Bacteroides fragilis NOT DETECTED NOT DETECTED   Enterobacterales DETECTED (A) NOT DETECTED   Enterobacter cloacae complex NOT DETECTED NOT DETECTED   Escherichia coli DETECTED (A) NOT DETECTED   Klebsiella aerogenes NOT DETECTED NOT DETECTED   Klebsiella oxytoca NOT DETECTED NOT DETECTED   Klebsiella pneumoniae NOT DETECTED NOT DETECTED   Proteus species NOT DETECTED NOT DETECTED   Salmonella species NOT DETECTED NOT DETECTED   Serratia marcescens NOT DETECTED NOT DETECTED   Haemophilus influenzae NOT DETECTED NOT DETECTED   Neisseria meningitidis NOT DETECTED  NOT DETECTED   Pseudomonas aeruginosa NOT DETECTED NOT DETECTED   Stenotrophomonas maltophilia NOT DETECTED NOT DETECTED   Candida albicans NOT DETECTED NOT DETECTED   Candida auris NOT DETECTED NOT DETECTED   Candida glabrata NOT DETECTED NOT DETECTED   Candida krusei NOT DETECTED NOT DETECTED   Candida parapsilosis NOT DETECTED NOT DETECTED   Candida tropicalis NOT DETECTED NOT DETECTED   Cryptococcus neoformans/gattii NOT DETECTED NOT DETECTED   CTX-M ESBL NOT DETECTED NOT DETECTED   Carbapenem resistance IMP NOT DETECTED NOT DETECTED   Carbapenem resistance KPC NOT DETECTED NOT DETECTED   Carbapenem resistance NDM NOT DETECTED NOT DETECTED   Carbapenem resist OXA 48 LIKE NOT DETECTED NOT DETECTED   Carbapenem resistance VIM NOT DETECTED NOT DETECTED    Peggyann Juba, PharmD, BCPS 12/31/2020  7:24 PM

## 2020-12-31 NOTE — Telephone Encounter (Signed)
   Patient Name: Tommy Peterson  DOB: Jan 02, 1941  MRN: [id number]   Primary Cardiologist: [name], MD  Chart reviewed as part of pre-operative protocol coverage. 80 year old male with history of large granular lymphocytic leukemia, DM, PAF on Eliquis, and HTN.  We have been asked to review his Eliquis in preparation for possible penile prosthesis removal. Pharmacy office protocol with approval for 1-day hold of Eliquis. Treating MD requests 2-day hold of Eliquis as well as 1-day hold postprocedure. Will route to the patient's primary cardiologist for final review and recommendations regarding duration of anticoagulation interruption.   Per pharmacy: Procedure: Penile prosthesis possible removal Date of procedure: TBD  CHA2DS2-VASc Score = 7  This indicates a 11.2% annual risk of stroke. The patient's score is based upon: CHF History: No HTN History: Yes Diabetes History: Yes Stroke History: Yes Vascular Disease History: Yes Age Score: 2 Gender Score: 0     CrCl 85.6 ml/min Platelet count 254  Per office protocol, patient can hold Eliquis for 1 day prior to procedure.    If longer hold is needed, MD approval will be needed.  Dr. Sallyanne Kuster, the treating MD requests a 2-day hold of Eliquis along with 1-day hold postprocedure. Are you ok with this duration. Please route back to the preop poorl at (p cv div preop.)   Christell Faith, PA-C 12/31/2020, 8:52 AM

## 2020-12-31 NOTE — Telephone Encounter (Signed)
Patient is currently admitted. Will need to contact him following discharge to risk stratify and review recommendations of holding anticoagulation. Preop staff, please let the requesting office know the patient is currently admitted and we will evaluate him following his discharge.

## 2020-12-31 NOTE — Evaluation (Signed)
Occupational Therapy Evaluation Patient Details Name: Tommy Peterson MRN: [id number] DOB: Aug 04, 1941 Today's Date: 12/31/2020    History of Present Illness Patient is an 80 year old male with painful urination, fever up to 102, scrotal swelling, and recent fall ~2 weeks ago. PMH signficant for Large Granular Lymphocytic leukemia chronic leukocytosis, macrocytic anemia, MGUS, recurent UTI, DM2. chornic diarrhea, a.fib on eliquis diastolic heart failure   Clinical Impression   Patient unable to provide history, when asked who he lives with having word finding issues/repeating words however does state 2 daughters. Patient needing max cues to stay on task and he is tangential and not following through with 1 step directions. Also keeping eyes closed at times needing cues to orient to task. Patient with poor sitting balance as well as standing, leaning toward L side despite max cues/attempts to weight shift. Attempted sit to stand with walker twice however patient unable to pivot toward R side where chair is located therefore sat back onto bed and performed squat pivot. Patient is high fall risk, currently recommend ST rehab prior to D/C home. Notified RN that patient's grip equal however continues to lean/favor toward L side. Acute OT to follow.    Follow Up Recommendations  SNF    Equipment Recommendations  3 in 1 bedside commode       Precautions / Restrictions Precautions Precautions: Fall Precaution Comments: hx of falls Restrictions Weight Bearing Restrictions: No      Mobility Bed Mobility Overal bed mobility: Needs Assistance Bed Mobility: Supine to Sit     Supine to sit: Max assist;HOB elevated     General bed mobility comments: trunk support and LE management    Transfers Overall transfer level: Needs assistance   Transfers: Squat Pivot Transfers     Squat pivot transfers: Max assist     General transfer comment: please see toilet transfer in ADL section; high  fall risk with patient leaning significantly to L despite chair on R side. RN made aware    Balance Overall balance assessment: Needs assistance;History of Falls Sitting-balance support: Feet supported;Bilateral upper extremity supported Sitting balance-Leahy Scale: Poor     Standing balance support: No upper extremity supported Standing balance-Leahy Scale: Zero Standing balance comment: max A for squat pivot                           ADL either performed or assessed with clinical judgement   ADL Overall ADL's : Needs assistance/impaired     Grooming: Wash/dry face;Supervision/safety;Sitting   Upper Body Bathing: Minimal assistance;Sitting   Lower Body Bathing: Maximal assistance;Sitting/lateral leans;Sit to/from stand   Upper Body Dressing : Minimal assistance;Sitting   Lower Body Dressing: Sitting/lateral leans;Sit to/from stand;Total assistance   Toilet Transfer: Maximal assistance;Squat-pivot Toilet Transfer Details (indicate cue type and reason): made two attempts to stand with rolling walker with patient leaning heavily to L side would not pivot to R side where chair was located. Had to return patient to sitting and perform squat pivot at max A. HIGH fall risk Toileting- Clothing Manipulation and Hygiene: Total assistance;Sitting/lateral lean;Sit to/from stand       Functional mobility during ADLs: Maximal assistance General ADL Comments: patient with poor balance, activity tolerance, safety awareness, cognition                  Pertinent Vitals/Pain Pain Assessment: Faces Faces Pain Scale: Hurts even more Pain Location: would not specify when asked where it is hurting Pain  Descriptors / Indicators: Grimacing;Moaning Pain Intervention(s): Monitored during session     Hand Dominance Left   Extremity/Trunk Assessment Upper Extremity Assessment Upper Extremity Assessment: Generalized weakness   Lower Extremity Assessment Lower Extremity  Assessment: Defer to PT evaluation       Communication Communication Communication: HOH   Cognition Arousal/Alertness: Awake/alert Behavior During Therapy: Flat affect Overall Cognitive Status: No family/caregiver present to determine baseline cognitive functioning                                 General Comments: patient with word finding difficulties, difficult to keep on task, poor direction following, keeping eyes closed, high fall risk              Home Living Family/patient expects to be discharged to:: Private residence Living Arrangements: Children (2 daughters) Available Help at Discharge: Family;Available 24 hours/day Type of Home: House Home Access: Stairs to enter [employer] of Steps: 3 and 6 (his typical way) Entrance Stairs-Rails: Right Home Layout: Able to live on main level with bedroom/bathroom;Laundry or work area in basement     [employer] Shower/Tub: Teacher, early years/pre: Handicapped height Bathroom Accessibility: Yes   Home Equipment: Environmental consultant - 2 wheels;Grab bars - toilet;Grab bars - tub/shower   Additional Comments: information obtained from prior admission/PT eval as patient unable to provide hx during OT eval      Prior Functioning/Environment Level of Independence: Independent with assistive device(s)        Comments: info from PT eval, no family present and patient unable to provide accurate hx        OT Problem List: Decreased strength;Decreased activity tolerance;Impaired balance (sitting and/or standing);Decreased cognition;Decreased safety awareness;Pain      OT Treatment/Interventions: Self-care/ADL training;Therapeutic exercise;DME and/or AE instruction;Therapeutic activities;Patient/family education;Balance training;Cognitive remediation/compensation    OT Goals(Current goals can be found in the care plan section) Acute Rehab OT Goals Patient Stated Goal: "I need water" OT Goal Formulation:  With patient Time For Goal Achievement: 01/14/21 Potential to Achieve Goals: Good  OT Frequency: Min 2X/week    AM-PAC OT "6 Clicks" Daily Activity     Outcome Measure Help from another person eating meals?: A Little Help from another person taking care of personal grooming?: A Little Help from another person toileting, which includes using toliet, bedpan, or urinal?: Total Help from another person bathing (including washing, rinsing, drying)?: A Lot Help from another person to put on and taking off regular upper body clothing?: A Little Help from another person to put on and taking off regular lower body clothing?: Total 6 Click Score: 13   End of Session Equipment Utilized During Treatment: Gait belt Nurse Communication: Mobility status;Other (comment) (favoring/leaning to L side)  Activity Tolerance: Patient limited by fatigue (Patient limited by cognition) Patient left: in chair;with call bell/phone within reach;with chair alarm set  OT Visit Diagnosis: Unsteadiness on feet (R26.81);Other abnormalities of gait and mobility (R26.89);Repeated falls (R29.6);History of falling (Z91.81);Muscle weakness (generalized) (M62.81);Other symptoms and signs involving cognitive function                Time: 9381-0175 OT Time Calculation (min): 31 min Charges:  OT General Charges $OT Visit: 1 Visit OT Evaluation $OT Eval Moderate Complexity: 1 Mod OT Treatments $Self Care/Home Management : 8-22 mins  Delbert Phenix OT OT pager: [place] 12/31/2020, 1:29 PM

## 2020-12-31 NOTE — Progress Notes (Signed)
[facility] PROGRESS NOTE    Tommy Peterson  ZOX:[id number] DOB: 05/21/1941 DOA: 12/30/2020 PCP: [name], DO      Brief Narrative:  Tommy Peterson is a 80 y.o. M with T cell leukemia (large granular lymphocytic, and watchful waiting off methotrexate due to recurrent infections) c/b transfusion dependence, recurrent UTI/septic shock, MGUS, penile implant, uncontrolled diabetes, dCHF, pAF on Eliquis, and HTN who presented with few days progressive pain with urination, fever, scrotal pain and swelling.  In the ER, found to be somewhat confused, lactic acid 3.2 mmol/L, febrile and tachycardia.  Started on antibiotics, cultures obtained, given fluids.        Assessment & Plan:  Severe sepsis due to epididymitis Patient presented with fever, leukocytosis, lactate greater than 3, and increasing confusion from his baseline in the setting of epididymitis, UTI. - Continue Levaquin - Follow urine and blood cultures    Paroxysmal atrial fibrillation Sinus rhythm - Continue amiodarone, metoprolol - Continue Eliquis  -Repeat ECG to check QT interval   T-cell leukemia Large granular lymphocytic leukemia MGUS Macrocytic anemia Last transfusion 5/5  Hypertension Chronic diastolic CHF Normal, no evidence of fluid overload - Hold furosemide until hemodynamics clear  BPH -Continue Flomax  Chronic diarrhea due to collagenous colitis -Continue budesonide  Hyponatremia Due to dehydration -Trend Na  Acute metabolic some probably due to sepsis  Diabetes, type 2 complicated by recurrent UTI, well controlled typically Glucoses well controlled -Hold home Soliqua -Continue Lantus -Continue SS corrections  Possible dementia Daughter reports increasing forgetfulness over last few months.  They have discussed this with his PCP at the [place], and were referred for neuropsych testing, which is pending.  Patient is normally independent, lives with daughter, does  not drive due to vision, but has not had prior memory issues until the last few months.  His current confusion is worse than his recent baseline.  Daughters note he has twice recently stopped taking his antibiotics after discharge     Leaning to the side Called to bedside at 10am for "patient leaning, aphasic".  When I arrived, patient sitting in chair, alert.  See neuro exam below.  NIHSS 0.  Doubt stroke       Disposition: Status is: Inpatient  Remains inpatient appropriate because:still confused, still requiring IV antibiotics for epididymitis   Dispo: The patient is from: Home              Anticipated d/c is to: Home              Patient currently is not medically stable to d/c.   Difficult to place patient No             Level of care: Med-Surg       MDM: The below labs and imaging reports were reviewed and summarized above.  Medication management as above.   DVT prophylaxis: SCDs Start: 12/30/20 1932 apixaban (ELIQUIS) tablet 5 mg  Code Status: FULL Family Communication: Daughter by phone             Subjective: No fever this morning.  No headache.  No confusion, vomiting, diarrhea.  No respiratory distress.  Objective: Vitals:   12/31/20 0008 12/31/20 0403 12/31/20 1011 12/31/20 1350  BP: (!) 127/50 101/66 (!) 119/45 (!) 120/50  Pulse: 64 (!) 52 60 68  Resp:  20 20 20   Temp: 99.7 F (37.6 C) 98.3 F (36.8 C) 98.9 F (37.2 C) 98.6 F (37 C)  TempSrc: Oral Oral Oral Oral  SpO2: 90% 91% 93% 99%  Weight:      Height:        Intake/Output Summary (Last 24 hours) at 12/31/2020 1528 Last data filed at 12/31/2020 1300 Gross per 24 hour  Intake 3486.25 ml  Output 700 ml  Net 2786.25 ml   Filed Weights   12/30/20 2029  Weight: 73.4 kg    Examination: General appearance: Elderly adult male, awake, responsive and in no obvious distress.  Appears debilitated. HEENT: Anicteric, conjunctiva pink, lids and lashes normal. No nasal  deformity, discharge, epistaxis.  Lips moist, dentures in place, oropharynx tacky dry, no oral lesions.   Skin: Warm and dry.  No jaundice.  No suspicious rashes or lesions. Cardiac: RRR, nl S1-S2, no murmurs appreciated.  Capillary refill is brisk.  JVPnot visible.  No LE edema.  Radial pulses 2+ and symmetric. Respiratory: Normal respiratory rate and rhythm.  CTAB without rales or wheezes. Abdomen: Abdomen soft.  No TTP or guarding. No ascites, distension, hepatosplenomegaly.   GU: The scrotum is somewhat edematous, very tender, no firmness, no crepitus.  The penis is not red, deformed, and has no ulcers or drainage. MSK: No deformities or effusions. Neuro: Pupils equal and reactive.  Extraocular movements are intact, without nystagmus.  Cranial nerve 5 is within normal limits.  Cranial nerve 7 is symmetrical.  Cranial nerve 8 is within normal limits.  Cranial nerves 9 and 10 reveal equal palate elevation.  Cranial nerve 11 reveals sternocleidomastoid strong.  Cranial nerve 12 is midline.  Motor strength testing is 5-/5 in the upper and lower extremities bilaterally with normal motor, tone and bulk. Sensory examination is intact to light touch. Finger-to-nose testing is initially slightly ataxic on the left, but on repeat is within normal limits, and rapid alternating movements is normal. The patient is oriented to self, month.  His psychomotor slowing is mild, he is forgetful of situation, unable to clearly articulate why he is in the hospital.  Attention and concentration seem diminished. Psych: Sensorium intact and responding to questions.  Judgment and insight appear .    Data Reviewed: I have personally reviewed following labs and imaging studies:  CBC: Recent Labs  Lab 12/30/20 1715 12/31/20 0401  WBC 18.0* 12.6*  NEUTROABS 10.7* 7.4  HGB 9.0* 10.8*  HCT 26.5* 32.3*  MCV 116.2* 112.9*  PLT 244 518*   Basic Metabolic Panel: Recent Labs  Lab 12/30/20 1715 12/30/20 1928  12/31/20 0401  NA 131*  --  135  K 4.3  --  4.3  CL 93*  --  101  CO2 27  --  27  GLUCOSE 324*  --  84  BUN 14  --  12  CREATININE 0.58*  --  0.49*  CALCIUM 9.1  --  8.6*  MG 1.9  --  1.8  PHOS  --  2.8 3.9   GFR: Estimated Creatinine Clearance: 76 mL/min (A) (by C-G formula based on SCr of 0.49 mg/dL (L)). Liver Function Tests: Recent Labs  Lab 12/30/20 1715 12/31/20 0401  AST 19 20  ALT 36 30  ALKPHOS 84 62  BILITOT 1.2 0.8  PROT 7.5 5.5*  ALBUMIN 3.7 2.9*   No results for input(s): LIPASE, AMYLASE in the last 168 hours. Recent Labs  Lab 12/30/20 1928  AMMONIA 32   Coagulation Profile: No results for input(s): INR, PROTIME in the last 168 hours. Cardiac Enzymes: Recent Labs  Lab 12/30/20 1715  CKTOTAL 20*   BNP (last 3 results) No results for  input(s): PROBNP in the last 8760 hours. HbA1C: Recent Labs    12/30/20 1921  HGBA1C 9.1*   CBG: Recent Labs  Lab 12/30/20 2114 12/31/20 0001 12/31/20 0349 12/31/20 0753 12/31/20 1102  GLUCAP 322* 341* 85 83 129*   Lipid Profile: No results for input(s): CHOL, HDL, LDLCALC, TRIG, CHOLHDL, LDLDIRECT in the last 72 hours. Thyroid Function Tests: Recent Labs    12/31/20 0401  TSH 0.826   Anemia Panel: No results for input(s): VITAMINB12, FOLATE, FERRITIN, TIBC, IRON, RETICCTPCT in the last 72 hours. Urine analysis:    Component Value Date/Time   COLORURINE YELLOW 12/30/2020 1614   APPEARANCEUR CLOUDY (A) 12/30/2020 1614   LABSPEC 1.020 12/30/2020 1614   PHURINE 8.0 12/30/2020 1614   GLUCOSEU >=500 (A) 12/30/2020 1614   HGBUR NEGATIVE 12/30/2020 1614   BILIRUBINUR NEGATIVE 12/30/2020 1614   BILIRUBINUR negative 10/29/2017 1428   KETONESUR 20 (A) 12/30/2020 1614   PROTEINUR NEGATIVE 12/30/2020 1614   UROBILINOGEN 0.2 10/29/2017 1428   NITRITE POSITIVE (A) 12/30/2020 1614   LEUKOCYTESUR LARGE (A) 12/30/2020 1614   Sepsis Labs: @LABRCNTIP (procalcitonin:4,lacticacidven:4)  ) Recent Results (from  the past 240 hour(s))  Resp Panel by RT-PCR (Flu A&B, Covid) Nasopharyngeal Swab     Status: None   Collection Time: 12/30/20  4:15 PM   Specimen: Nasopharyngeal Swab; Nasopharyngeal(NP) swabs in vial transport medium  Result Value Ref Range Status   SARS Coronavirus 2 by RT PCR NEGATIVE NEGATIVE Final    Comment: (NOTE) SARS-CoV-2 target nucleic acids are NOT DETECTED.  The SARS-CoV-2 RNA is generally detectable in upper respiratory specimens during the acute phase of infection. The lowest concentration of SARS-CoV-2 viral copies this assay can detect is 138 copies/mL. A negative result does not preclude SARS-Cov-2 infection and should not be used as the sole basis for treatment or other patient management decisions. A negative result may occur with  improper specimen collection/handling, submission of specimen other than nasopharyngeal swab, presence of viral mutation(s) within the areas targeted by this assay, and inadequate number of viral copies(<138 copies/mL). A negative result must be combined with clinical observations, patient history, and epidemiological information. The expected result is Negative.  Fact Sheet for Patients:  EntrepreneurPulse.com.au  Fact Sheet for Healthcare Providers:  IncredibleEmployment.be  This test is no t yet approved or cleared by the [place] FDA and  has been authorized for detection and/or diagnosis of SARS-CoV-2 by FDA under an Emergency Use Authorization (EUA). This EUA will remain  in effect (meaning this test can be used) for the duration of the COVID-19 declaration under Section 564(b)(1) of the Act, 21 U.S.C.section 360bbb-3(b)(1), unless the authorization is terminated  or revoked sooner.       Influenza A by PCR NEGATIVE NEGATIVE Final   Influenza B by PCR NEGATIVE NEGATIVE Final    Comment: (NOTE) The Xpert Xpress SARS-CoV-2/FLU/RSV plus assay is intended as an aid in the diagnosis of  influenza from Nasopharyngeal swab specimens and should not be used as a sole basis for treatment. Nasal washings and aspirates are unacceptable for Xpert Xpress SARS-CoV-2/FLU/RSV testing.  Fact Sheet for Patients: EntrepreneurPulse.com.au  Fact Sheet for Healthcare Providers: IncredibleEmployment.be  This test is not yet approved or cleared by the [place] FDA and has been authorized for detection and/or diagnosis of SARS-CoV-2 by FDA under an Emergency Use Authorization (EUA). This EUA will remain in effect (meaning this test can be used) for the duration of the COVID-19 declaration under Section 564(b)(1) of the Act, 21  U.S.C. section 360bbb-3(b)(1), unless the authorization is terminated or revoked.  Performed at [facility], [place] [address], [place], [place] [address]   Culture, blood (routine x 2)     Status: None (Preliminary result)   Collection Time: 12/30/20  5:15 PM   Specimen: BLOOD  Result Value Ref Range Status   Specimen Description   Final    BLOOD BLOOD LEFT HAND Performed at [facility] [address], [place], [place] [address]    Special Requests   Final    BOTTLES DRAWN AEROBIC AND ANAEROBIC Blood Culture results may not be optimal due to an inadequate volume of blood received in culture bottles Performed at [facility] [address], [place], [place] [address]    Culture   Final    NO GROWTH < 12 HOURS Performed at [facility] [address], [place], [place] [address]    Report Status PENDING  Incomplete  Culture, blood (routine x 2)     Status: None (Preliminary result)   Collection Time: 12/30/20  5:15 PM   Specimen: BLOOD  Result Value Ref Range Status   Specimen Description   Final    BLOOD BLOOD RIGHT ARM Performed at [facility] [address], [place], [place] [address]    Special Requests   Final    BOTTLES  DRAWN AEROBIC AND ANAEROBIC Blood Culture adequate volume Performed at [facility] [address], [place], [place] [address]    Culture   Final    NO GROWTH < 12 HOURS Performed at [facility] [address], [place], [place] [address]    Report Status PENDING  Incomplete         Radiology Studies: CT HEAD WO CONTRAST  Result Date: 12/30/2020 CLINICAL DATA:  Head trauma. EXAM: CT HEAD WITHOUT CONTRAST TECHNIQUE: Contiguous axial images were obtained from the base of the skull through the vertex without intravenous contrast. COMPARISON:  Head CT 12/09/2020 FINDINGS: Brain: Mild motion artifact despite repeat acquisition. Stable degree of atrophy and chronic small vessel ischemia. No intracranial hemorrhage, mass effect, or midline shift. No hydrocephalus. The basilar cisterns are patent. No evidence of territorial infarct or acute ischemia. No extra-axial or intracranial fluid collection. Vascular: Atherosclerosis of skullbase vasculature without  US SCROTUM W/DOPPLER  Result Date: 12/30/2020 CLINICAL DATA:  Right testicle pain with fever EXAM: SCROTAL ULTRASOUND DOPPLER ULTRASOUND OF THE TESTICLES TECHNIQUE: Complete ultrasound examination of the testicles, epididymis, and other scrotal structures was performed. Color and spectral Doppler ultrasound were also utilized to evaluate blood flow to the testicles. COMPARISON:  None. FINDINGS: Right testicle Measurements: 4.7 x 2.1 x 4.1 cm. No mass or microlithiasis visualized. Left testicle Measurements: 4.8 x 2.4 x 2.4 cm. No mass or microlithiasis visualized. Heterogeneous echotexture without discrete mass. Right epididymis:  Heterogenous and enlarged, slightly hyperemic. Left epididymis:  Normal in size and appearance. Hydrocele:  Small right-sided hydrocele. Varicocele:  None visualized. Pulsed Doppler interrogation of both testes demonstrates normal low resistance arterial and venous waveforms bilaterally.  IMPRESSION: 1. Heterogenous, enlarged right epididymis with slight increased vascularity suspicious for epididymitis. 2. Slightly smaller diffusely heterogeneous left testis compared to the right, question mild atrophy and previous insult. Slightly asymmetric diminished flow to left testis but without definitive evidence for acute torsion. Electronically Signed   By: Donavan Foil M.D.   On: 12/30/2020 16:56        Scheduled Meds: . amiodarone  200 mg Oral Daily  . apixaban  5 mg Oral BID  . budesonide  9 mg Oral Daily  . feeding supplement  237 mL Oral BID BM  . folic acid  1 mg Oral Daily  . insulin aspart  0-9 Units Subcutaneous Q4H  . insulin glargine  15 Units Subcutaneous QHS  . metoprolol tartrate  50 mg Oral BID  . tamsulosin  0.4 mg Oral QPC breakfast   Continuous Infusions: . levofloxacin (LEVAQUIN) IV 500 mg (12/31/20 0832)     LOS: 0 days    Time spent: 25-minutes    Edwin Dada, MD [facility] 12/31/2020, 3:28 PM     Please page though [place] or Epic secure chat:  For [employer], Adult nurse

## 2021-01-01 ENCOUNTER — Inpatient Hospital Stay: Payer: Medicare HMO

## 2021-01-01 DIAGNOSIS — A419 Sepsis, unspecified organism: Secondary | ICD-10-CM | POA: Diagnosis not present

## 2021-01-01 LAB — GLUCOSE, CAPILLARY
Glucose-Capillary: 115 mg/dL — ABNORMAL HIGH (ref 70–99)
Glucose-Capillary: 236 mg/dL — ABNORMAL HIGH (ref 70–99)
Glucose-Capillary: 250 mg/dL — ABNORMAL HIGH (ref 70–99)
Glucose-Capillary: 299 mg/dL — ABNORMAL HIGH (ref 70–99)
Glucose-Capillary: 299 mg/dL — ABNORMAL HIGH (ref 70–99)
Glucose-Capillary: 368 mg/dL — ABNORMAL HIGH (ref 70–99)
Glucose-Capillary: 85 mg/dL (ref 70–99)

## 2021-01-01 NOTE — Plan of Care (Signed)
  Problem: Education: Goal: Knowledge of General Education information will improve Description: Including pain rating scale, medication(s)/side effects and non-pharmacologic comfort measures Outcome: Progressing   Problem: Clinical Measurements: Goal: Diagnostic test results will improve Outcome: Progressing   Problem: Nutrition: Goal: Adequate nutrition will be maintained Outcome: Progressing   Problem: Safety: Goal: Ability to remain free from injury will improve Outcome: Progressing

## 2021-01-01 NOTE — Progress Notes (Signed)
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 fib on Eliquis and hypertension who presented in the ED with few days hx. of progressive pain with urination,  Fever, scrotal pain and swelling. He is found to be confused in the ED with lactic acid 3.2,  febrile and tachycardic. Patient was admitted for sepsis secondary to epididymitis and was started on antibiotics.  Assessment & Plan:   Active Problems:   Colitis, collagenous   Essential hypertension   Acute metabolic encephalopathy   Large granular lymphocytic leukemia (HCC)   Macrocytic anemia   BPH (benign prostatic hyperplasia)   DM type 2 (diabetes mellitus, type 2) (HCC)   Paroxysmal atrial fibrillation (HCC)   Sepsis (HCC)   UTI (urinary tract infection)   Chronic anticoagulation   Acute on chronic heart failure with preserved ejection fraction (HFpEF) (HCC)   Frequent falls   Epididymitis   Dehydration   Hyponatremia   Head injury   Severe sepsis ([place])  Severe sepsis due to epididymitis Patient presented with fever, leukocytosis, lactate greater than 3, and increasing confusion from his baseline in the setting of epididymitis, UTI. Continue Levaquin. Blood cultures grew Enterobacteria  and E. Coli,  sensitivity pending.   Paroxysmal atrial fibrillation Heart rate controlled and remains in sinus rhythm. Continue amiodarone, metoprolol Continue Eliquis Repeat ECG to check QT interval   T-cell leukemia Large granular lymphocytic leukemia MGUS Macrocytic anemia Last transfusion 5/5. CBC remains stable.  Hypertension > controlled Resume home blood pressure  medications.  Chronic diastolic CHF: No evidence of fluid overload. Hold furosemide until hemodynamics clear.  BPH: Continue Flomax  Chronic diarrhea due to collagenous colitis . Continue budesonide  Hyponatremia Due to dehydration Improved.   Diabetes, type 2 complicated by recurrent UTI, well controlled . Glucose well controlled Hold home Soliqua Continue Lantus Continue SS corrections  Possible dementia Daughter reports increasing forgetfulness over last few months.  They have discussed this with his PCP at the [place], and were referred for neuropsych testing, which is pending.  Patient is normally independent, lives with daughter, does not drive due to vision, but has not had prior memory issues until the last few months.  His current confusion is worse than his recent baseline. Daughters reports he has twice recently stopped taking his antibiotics after discharge     DVT prophylaxis: Eliquis Code Status: Full code. Family Communication: No family at bed side. Disposition Plan:  Status is: Inpatient  Remains inpatient appropriate because:Inpatient level of care appropriate due to severity of illness   Dispo: The patient is from: Home              Anticipated d/c is to: Home              Patient currently is not medically stable to d/c.   Difficult to place patient No   Consultants:    Urology   Procedures:   Antimicrobials:   Anti-infectives (From admission, onward)   Start     Dose/Rate Route Frequency Ordered Stop   12/31/20 2000  cefTRIAXone (ROCEPHIN) 2 g in sodium chloride 0.9 % 100 mL IVPB        2 g 200 mL/hr over 30 Minutes Intravenous Every 24  hours 12/31/20 1921     12/31/20 1000  levofloxacin (LEVAQUIN) IVPB 500 mg  Status:  Discontinued        500 mg 100 mL/hr over 60 Minutes Intravenous Daily 12/30/20 1945 12/31/20 1921   12/30/20 1730  levofloxacin (LEVAQUIN) IVPB 500 mg        500 mg 100 mL/hr over 60 Minutes Intravenous  Once  12/30/20 1715 12/30/20 1822      Subjective: Patient was seen and examined at bedside.  Overnight events noted.  Patient reports feeling much improved.   Patient states he wants urologist consultation for penile implant.  He is concerned about having recurrent UTIs.  Objective: Vitals:   12/31/20 2344 01/01/21 0451 01/01/21 0907 01/01/21 1322  BP: (!) 124/56 (!) 128/57  107/87  Pulse: 60 (!) 52 (!) 51 64  Resp: 17 18  (!) 23  Temp: 98.1 F (36.7 C) 98.5 F (36.9 C)  98.6 F (37 C)  TempSrc: Oral Oral  Oral  SpO2: 95% 95%  93%  Weight:      Height:        Intake/Output Summary (Last 24 hours) at 01/01/2021 1338 Last data filed at 01/01/2021 1300 Gross per 24 hour  Intake 1180 ml  Output 1250 ml  Net -70 ml   Filed Weights   12/30/20 2029  Weight: 73.4 kg    Examination:  General exam: Appears calm and comfortable, not in any acute distress. Respiratory system: Clear to auscultation. Respiratory effort normal. Cardiovascular system: S1 & S2 heard, RRR. No JVD, murmurs, rubs, gallops or clicks. No pedal edema. Gastrointestinal system: Abdomen is nondistended, soft and nontender. No organomegaly or masses felt. Normal bowel sounds heard. Central nervous system: Alert and oriented. No focal neurological deficits. Extremities: Symmetric 5 x 5 power.  No edema, no cyanosis, no clubbing. Skin: No rashes, lesions or ulcers Psychiatry: Judgement and insight appear normal. Mood & affect appropriate.     Data Reviewed: I have personally reviewed following labs and imaging studies  CBC: Recent Labs  Lab 12/30/20 1715 12/31/20 0401  WBC 18.0* 12.6*  NEUTROABS 10.7* 7.4  HGB 9.0* 10.8*  HCT 26.5* 32.3*  MCV 116.2* 112.9*  PLT 244 916*   Basic Metabolic Panel: Recent Labs  Lab 12/30/20 1715 12/30/20 1928 12/31/20 0401  NA 131*  --  135  K 4.3  --  4.3  CL 93*  --  101  CO2 27  --  27  GLUCOSE 324*  --  84  BUN 14  --  12  CREATININE 0.58*  --  0.49*  CALCIUM  9.1  --  8.6*  MG 1.9  --  1.8  PHOS  --  2.8 3.9   GFR: Estimated Creatinine Clearance: 76 mL/min (A) (by C-G formula based on SCr of 0.49 mg/dL (L)). Liver Function Tests: Recent Labs  Lab 12/30/20 1715 12/31/20 0401  AST 19 20  ALT 36 30  ALKPHOS 84 62  BILITOT 1.2 0.8  PROT 7.5 5.5*  ALBUMIN 3.7 2.9*   No results for input(s): LIPASE, AMYLASE in the last 168 hours. Recent Labs  Lab 12/30/20 1928  AMMONIA 32   Coagulation Profile: No results for input(s): INR, PROTIME in the last 168 hours. Cardiac Enzymes: Recent Labs  Lab 12/30/20 1715  CKTOTAL 20*   BNP (last 3 results) No results for input(s): PROBNP in the last 8760 hours. HbA1C: Recent Labs    12/30/20 1921  HGBA1C 9.1*   CBG: Recent Labs  Lab 12/31/20 2136 01/01/21 0100 01/01/21 0448 01/01/21 0832 01/01/21 1200  GLUCAP 345* 368* 115* 85 250*   Lipid Profile: No results for input(s): CHOL, HDL, LDLCALC, TRIG, CHOLHDL, LDLDIRECT in the last 72 hours. Thyroid Function Tests: Recent Labs    12/31/20 0401  TSH 0.826   Anemia Panel: No results for input(s): VITAMINB12, FOLATE, FERRITIN, TIBC, IRON, RETICCTPCT in the last 72 hours. Sepsis Labs: Recent Labs  Lab 12/30/20 1715 12/30/20 1929 12/30/20 2159  PROCALCITON <0.10  --   --   LATICACIDVEN 3.2* 1.5 1.4    Recent Results (from the past 240 hour(s))  Urine culture     Status: Abnormal (Preliminary result)   Collection Time: 12/30/20  4:14 PM   Specimen: Urine, Clean Catch  Result Value Ref Range Status   Specimen Description   Final    URINE, CLEAN CATCH Performed at [facility], [place] [address] [place], [place] [address]    Special Requests   Final    NONE Performed at [facility], [place] [address], [place], [place] [address]    Culture (A)  Final    >=100,000 COLONIES/mL ESCHERICHIA COLI SUSCEPTIBILITIES TO FOLLOW Performed at [facility], [place] [address], [place], [place]  [address]    Report Status PENDING  Incomplete  Resp Panel by RT-PCR (Flu A&B, Covid) Nasopharyngeal Swab     Status: None   Collection Time: 12/30/20  4:15 PM   Specimen: Nasopharyngeal Swab; Nasopharyngeal(NP) swabs in vial transport medium  Result Value Ref Range Status   SARS Coronavirus 2 by RT PCR NEGATIVE NEGATIVE Final    Comment: (NOTE) SARS-CoV-2 target nucleic acids are NOT DETECTED.  The SARS-CoV-2 RNA is generally detectable in upper respiratory specimens during the acute phase of infection. The lowest concentration of SARS-CoV-2 viral copies this assay can detect is 138 copies/mL. A negative result does not preclude SARS-Cov-2 infection and should not be used as the sole basis for treatment or other patient management decisions. A negative result may occur with  improper specimen collection/handling, submission of specimen other than nasopharyngeal swab, presence of viral mutation(s) within the areas targeted by this assay, and inadequate number of viral copies(<138 copies/mL). A negative result must be combined with clinical observations, patient history, and epidemiological information. The expected result is Negative.  Fact Sheet for Patients:  EntrepreneurPulse.com.au  Fact Sheet for Healthcare Providers:  IncredibleEmployment.be  This test is no t yet approved or cleared by the [place] FDA and  has been authorized for detection and/or diagnosis of SARS-CoV-2 by FDA under an Emergency Use Authorization (EUA). This EUA will remain  in effect (meaning this test can be used) for the duration of the COVID-19 declaration under Section 564(b)(1) of the Act, 21 U.S.C.section 360bbb-3(b)(1), unless the authorization is terminated  or revoked sooner.       Influenza A by PCR NEGATIVE NEGATIVE Final   Influenza B by PCR NEGATIVE NEGATIVE Final    Comment: (NOTE) The Xpert Xpress SARS-CoV-2/FLU/RSV plus assay is intended as an  aid in the diagnosis of influenza from Nasopharyngeal swab specimens and should not be used as a sole basis for treatment. Nasal washings and aspirates are unacceptable for Xpert Xpress SARS-CoV-2/FLU/RSV testing.  Fact Sheet for Patients: EntrepreneurPulse.com.au  Fact Sheet for Healthcare Providers: IncredibleEmployment.be  This test is not yet approved or cleared by the [place] FDA and has been authorized for detection and/or diagnosis of SARS-CoV-2 by FDA under an Emergency Use Authorization (  EUA). This EUA will remain in effect (meaning this test can be used) for the duration of the COVID-19 declaration under Section 564(b)(1) of the Act, 21 U.S.C. section 360bbb-3(b)(1), unless the authorization is terminated or revoked.  Performed at [facility], [place] [address] [place], [place] [address]   Culture, blood (routine x 2)     Status: None (Preliminary result)   Collection Time: 12/30/20  5:15 PM   Specimen: BLOOD  Result Value Ref Range Status   Specimen Description   Final    BLOOD BLOOD LEFT HAND Performed at [place] [address], [place], [place] [address]    Special Requests   Final    BOTTLES DRAWN AEROBIC AND ANAEROBIC Blood Culture results may not be optimal due to an inadequate volume of blood received in culture bottles Performed at [facility] [address] [place], [place] [address]    Culture   Final    NO GROWTH 2 DAYS Performed at [facility] [address], [place], [place] [address]    Report Status PENDING  Incomplete  Culture, blood (routine x 2)     Status: Abnormal (Preliminary result)   Collection Time: 12/30/20  5:15 PM   Specimen: BLOOD  Result Value Ref Range Status   Specimen Description   Final    BLOOD BLOOD RIGHT ARM Performed at [facility] [address] [place], [place] [address]    Special  Requests   Final    BOTTLES DRAWN AEROBIC AND ANAEROBIC Blood Culture adequate volume Performed at [facility] [address] [place], [place] [address]    Culture  Setup Time   Final    GRAM NEGATIVE RODS AEROBIC BOTTLE ONLY CRITICAL RESULT CALLED TO, READ BACK BY AND VERIFIED WITH: Cristopher Estimable PHARMD, AT 1913 12/31/20 D. VANHOOK    Culture (A)  Final    ESCHERICHIA COLI SUSCEPTIBILITIES TO FOLLOW Performed at [facility], [place] [address], [place], [place] [address]    Report Status PENDING  Incomplete  Blood Culture ID Panel (Reflexed)     Status: Abnormal   Collection Time: 12/30/20  5:15 PM  Result Value Ref Range Status   Enterococcus faecalis NOT DETECTED NOT DETECTED Final   Enterococcus Faecium NOT DETECTED NOT DETECTED Final   Listeria monocytogenes NOT DETECTED NOT DETECTED Final   Staphylococcus species NOT DETECTED NOT DETECTED Final   Staphylococcus aureus (BCID) NOT DETECTED NOT DETECTED Final   Staphylococcus epidermidis NOT DETECTED NOT DETECTED Final   Staphylococcus lugdunensis NOT DETECTED NOT DETECTED Final   Streptococcus species NOT DETECTED NOT DETECTED Final   Streptococcus agalactiae NOT DETECTED NOT DETECTED Final   Streptococcus pneumoniae NOT DETECTED NOT DETECTED Final   Streptococcus pyogenes NOT DETECTED NOT DETECTED Final   A.calcoaceticus-baumannii NOT DETECTED NOT DETECTED Final   Bacteroides fragilis NOT DETECTED NOT DETECTED Final   Enterobacterales DETECTED (A) NOT DETECTED Final    Comment: Enterobacterales represent a large order of gram negative bacteria, not a single organism.   Enterobacter cloacae complex NOT DETECTED NOT DETECTED Final   Escherichia coli DETECTED (A) NOT DETECTED Final    Comment: CRITICAL RESULT CALLED TO, READ BACK BY AND VERIFIED WITH: EMaricela Bo PHARMD, AT 1913 12/31/20 D. VANHOOK    Klebsiella aerogenes NOT DETECTED NOT DETECTED Final   Klebsiella oxytoca NOT DETECTED NOT DETECTED  Final   Klebsiella pneumoniae NOT DETECTED NOT DETECTED Final   Proteus  species NOT DETECTED NOT DETECTED Final   Salmonella species NOT DETECTED NOT DETECTED Final   Serratia marcescens NOT DETECTED NOT DETECTED Final   Haemophilus influenzae NOT DETECTED NOT DETECTED Final   Neisseria meningitidis NOT DETECTED NOT DETECTED Final   Pseudomonas aeruginosa NOT DETECTED NOT DETECTED Final   Stenotrophomonas maltophilia NOT DETECTED NOT DETECTED Final   Candida albicans NOT DETECTED NOT DETECTED Final   Candida auris NOT DETECTED NOT DETECTED Final   Candida glabrata NOT DETECTED NOT DETECTED Final   Candida krusei NOT DETECTED NOT DETECTED Final   Candida parapsilosis NOT DETECTED NOT DETECTED Final   Candida tropicalis NOT DETECTED NOT DETECTED Final   Cryptococcus neoformans/gattii NOT DETECTED NOT DETECTED Final   CTX-M ESBL NOT DETECTED NOT DETECTED Final   Carbapenem resistance IMP NOT DETECTED NOT DETECTED Final   Carbapenem resistance KPC NOT DETECTED NOT DETECTED Final   Carbapenem resistance NDM NOT DETECTED NOT DETECTED Final   Carbapenem resist OXA 48 LIKE NOT DETECTED NOT DETECTED Final   Carbapenem resistance VIM NOT DETECTED NOT DETECTED Final    Comment: Performed at [facility], [address], [place], [place] [address]         Radiology Studies: CT HEAD WO CONTRAST  Result Date: 12/30/2020 CLINICAL DATA:  Head trauma. EXAM: CT HEAD WITHOUT CONTRAST TECHNIQUE: Contiguous axial images were obtained from the base of the skull through the vertex without intravenous contrast. COMPARISON:  Head CT 12/09/2020 FINDINGS: Brain: Mild motion artifact despite repeat acquisition. Stable degree of atrophy and chronic small vessel ischemia. No intracranial hemorrhage, mass effect, or midline shift. No hydrocephalus. The basilar cisterns are patent. No evidence of territorial infarct or acute ischemia. No extra-axial or intracranial fluid collection. Vascular: Atherosclerosis  of skullbase vasculature without  US SCROTUM W/DOPPLER  Result Date: 12/30/2020 CLINICAL DATA:  Right testicle pain with fever EXAM: SCROTAL ULTRASOUND DOPPLER ULTRASOUND OF THE TESTICLES TECHNIQUE: Complete ultrasound examination of the testicles, epididymis, and other scrotal structures was performed. Color and spectral Doppler ultrasound were also utilized to evaluate blood flow to the testicles. COMPARISON:  None. FINDINGS: Right testicle Measurements: 4.7 x 2.1 x 4.1 cm. No mass or microlithiasis visualized. Left testicle Measurements: 4.8 x 2.4 x 2.4 cm. No mass or microlithiasis visualized. Heterogeneous echotexture without discrete mass. Right epididymis:  Heterogenous and enlarged, slightly hyperemic. Left epididymis:  Normal in size and appearance. Hydrocele:  Small right-sided hydrocele. Varicocele:  None visualized. Pulsed Doppler interrogation of both testes demonstrates normal low resistance arterial and venous waveforms bilaterally. IMPRESSION: 1. Heterogenous, enlarged right epididymis with slight increased vascularity suspicious for epididymitis. 2. Slightly smaller diffusely heterogeneous left testis compared to the right, question mild atrophy and previous insult. Slightly asymmetric diminished flow to left testis but without definitive evidence for acute torsion. Electronically Signed   By: Donavan Foil M.D.   On: 12/30/2020 16:56   Scheduled Meds: . amiodarone  200 mg Oral Daily  . apixaban  5 mg Oral BID  . budesonide  9 mg Oral Daily  . feeding supplement  237 mL Oral BID BM  . folic acid  1 mg Oral Daily  . insulin aspart  0-9 Units Subcutaneous Q4H  . insulin glargine  15 Units Subcutaneous QHS  . metoprolol tartrate  50 mg Oral BID  . tamsulosin  0.4 mg Oral QPC breakfast   Continuous Infusions: . cefTRIAXone (ROCEPHIN)  IV 2 g (12/31/20 2100)     LOS: 1 day    Time spent:  44 mins    Shawna Clamp, MD [facility]   If 7PM-7AM, please contact  night-coverage

## 2021-01-02 ENCOUNTER — Encounter (HOSPITAL_COMMUNITY): Payer: Self-pay | Admitting: Family Medicine

## 2021-01-02 DIAGNOSIS — N451 Epididymitis: Secondary | ICD-10-CM | POA: Diagnosis not present

## 2021-01-02 LAB — GLUCOSE, CAPILLARY
Glucose-Capillary: 195 mg/dL — ABNORMAL HIGH (ref 70–99)
Glucose-Capillary: 145 mg/dL — ABNORMAL HIGH (ref 70–99)
Glucose-Capillary: 272 mg/dL — ABNORMAL HIGH (ref 70–99)
Glucose-Capillary: 306 mg/dL — ABNORMAL HIGH (ref 70–99)
Glucose-Capillary: 260 mg/dL — ABNORMAL HIGH (ref 70–99)

## 2021-01-02 LAB — URINE CULTURE: Culture: >=100000 — AB

## 2021-01-02 LAB — CULTURE, BLOOD (ROUTINE X 2): Special Requests: ADEQUATE

## 2021-01-02 MED ORDER — DOXYCYCLINE HYCLATE 100 MG PO CAPS: 100.0000 mg | ORAL_CAPSULE | Freq: Every day | ORAL | 5 refills | Status: DC

## 2021-01-02 MED ORDER — CEPHALEXIN 500 MG PO CAPS
500.0000 mg | ORAL_CAPSULE | Freq: Three times a day (TID) | ORAL | Status: DC
  Administered 2021-01-02 – 2021-01-03 (×4): 500 mg via ORAL
  Filled 2021-01-02 (×4): qty 1

## 2021-01-02 MED ORDER — INSULIN ASPART 100 UNIT/ML IJ SOLN
0.0000 [IU] | Freq: Every day | INTRAMUSCULAR | Status: DC
  Administered 2021-01-02: 3 [IU] via SUBCUTANEOUS

## 2021-01-02 MED ORDER — INSULIN ASPART 100 UNIT/ML IJ SOLN
0.0000 [IU] | Freq: Three times a day (TID) | INTRAMUSCULAR | Status: DC
  Administered 2021-01-03: 2 [IU] via SUBCUTANEOUS

## 2021-01-02 NOTE — Consult Note (Signed)
Subjective:    Consult requested by Shawna Clamp.   Tommy Peterson is an 80 yo male who is admitted with recurrent sepsis.  He reports multiple episodes requiring admission and is back in now with e. Coli sepsis and right epididymitis.   He has a penile prosthesis with the reservoir malpositioned in the left inquinal area.  He was to have that revised in January but had tachycardia and the procedure was canceled.  He has chronic urinary frequency and urgency with incomplete emptying.   He has previously been seen our office, initially by me in 2019 and subsequently by Dr. Tresa Moore with the last visit in 10/21.   I did a cystoscopy with cystolithalopaxy for bladder and prostatic urethral stones in the resection bed from a TURP done around 2009.  He had a penile prosthesis placed in 2017 by Dr. [name] in [place]. He has previously been tried on keflex at bedtime for suppression without success.  He had a CT in 10/21 that showed no stones or other significant GU findings.   He has had no hematuria.   He has had right testicular pain.  ROS:  Review of Systems  Constitutional: Negative for chills and fever.  Respiratory: Negative for shortness of breath.   Cardiovascular: Negative for chest pain.  Genitourinary: Positive for frequency.    Allergies  Allergen Reactions  . Bee Venom Swelling and Anaphylaxis  . Metformin And Related Nausea And Vomiting  . Sulfamethoxazole-Trimethoprim Nausea And Vomiting  . Bee Pollen Other (See Comments)    Unknown   . Metformin Hcl Other (See Comments)    sick     Past Medical History:  Diagnosis Date  . Acute respiratory failure ([place])   . BCC (basal cell carcinoma of skin) 08/15/2016   w/SCC Left Forehead (treatment @ [facility])  . BPH (benign prostatic hyperplasia)   . Carpal tunnel syndrome on both sides   . Chronic diarrhea    intermittant due to chronic collagenous colitis  . 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 Collagenous colitis    chronic--- intermittant between diarrahea and constipation  . ED (erectile dysfunction)   . HCAP (healthcare-associated pneumonia)   . History of adenomatous polyp of colon   . History of Clostridium difficile colitis 05/2016  . History of pneumonia 11/08/2017   CAP -- [facility]---  12-19-2017  per pt no residual symptoms  . History of SCC (squamous cell carcinoma) of skin   . History of sepsis    11-20-2017 severe sepsis due to UTI;  10/ 2017sepsis due to c-diff colitis  . Leucocytosis    chronic  . Lower urinary tract symptoms (LUTS)   . Macrocytic anemia    since 02/ 2015  . MGUS (monoclonal gammopathy of unknown significance)    hemotologist-  [name] @ [facility]  . Monoclonal paraproteinemia   . Neuropathy, peripheral   . Nodular basal cell carcinoma (BCC) 10/01/2018   Left Neck(Nodular) Pt Does Not Want Treatment  . Nodular basal cell carcinoma (BCC) 10/01/2018   Mid Forehead (treament curet and excision)  . OA (osteoarthritis)   . Prostatic stone   . Rash of face    RIGHT SIDE  . Raynaud's syndrome   .  Recurrent BCC (basal cell carcinoma) 03/05/2019   positive margin  . SCC (squamous cell carcinoma) 08/10/2009   Central Forehead (Moh's Dr. Rhona Raider @ [facility])  . SCC (squamous cell carcinoma) 12/08/2011   CIS-Left Cheek (treatment Aldara @ [facility])  . SCC (squamous cell carcinoma) 07/09/2015   CIS-Left Forehead (treament @ [facility])  . Septic shock (HCC)    x 3 w/UTI  . Squamous cell carcinoma in situ (SCCIS) 12/08/2011   Left Cheek (treatment Aldara @ [facility])   . Superficial basal cell carcinoma (BCC) 10/01/2018   Left Shin-Pt does not want treatment  . Thin skin     fragile  . Transfusion-dependent anemia since 10/ 2017   last transfusion PRBCs 07-19-2017  per hemologist note dated 10-25-2017  . Type 2 diabetes mellitus treated with insulin ([place])    followed by [name] (pcp)  . Urinary hesitancy   . Wears contact lenses    LEFT EYE ONLY    Past Surgical History:  Procedure Laterality Date  . BONE MARROW BIOPSY Right 05-24-2016;  08-20-2014  . CARDIOVASCULAR STRESS TEST  11-26-2012   [name]  @ [place] ([place]   normal nuclear study w/ no ischemia/  normal LV function and wall motion , ef 60% (find in care everywhere,epic)  . CARDIOVERSION N/A 08/31/2020   Procedure: CARDIOVERSION;  Surgeon: [name], MD;  Location: [place];  Service: Cardiovascular;  Laterality: N/A;  . CATARACT EXTRACTION W/ INTRAOCULAR LENS  IMPLANT, BILATERAL  2015  . CYSTOSCOPY WITH LITHOLAPAXY N/A 12/25/2017   Procedure: CYSTOSCOPY WITH LITHOLAPAXY AND FULGERATION;  Surgeon: [name], MD;  Location: [facility];  Service: Urology;  Laterality: N/A;  . INGUINAL HERNIA REPAIR Right 1980s  . LOOP RECORDER INSERTION N/A 04/10/2018   Procedure: LOOP RECORDER INSERTION;  Surgeon: [name], MD;  Location: [place] [facility];  Service: Cardiovascular;  Laterality: N/A;  . PENILE PROSTHESIS IMPLANT  12-02-2015   dr [name] @ [facility] in [place]  . SHOULDER SURGERY Left 08/2016  . TEE WITHOUT CARDIOVERSION N/A 08/31/2020   Procedure: TRANSESOPHAGEAL ECHOCARDIOGRAM (TEE);  Surgeon: [name], MD;  Location: [place];  Service: Cardiovascular;  Laterality: N/A;  . TONSILLECTOMY  child  . TRANSURETHRAL RESECTION OF PROSTATE  2009   AND REPAIR RECURRENT RIGHT INGUINAL HERNIA    Social History   Socioeconomic History  . Marital status: Widowed    Spouse name: engaged  . Number of children: 8  . Years of education: college-2  . Highest education level: Not on file  Occupational History  . Occupation: Press photographer  .  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 Smoking status: Former Smoker    Years: 20.00    Types: Cigarettes    Quit date: 05/04/1996    Years since quitting: 24.6  . Smokeless tobacco: Never Used  Vaping Use  . Vaping Use: Never used  Substance and Sexual Activity  . Alcohol use: Yes    Comment: 1-2 glasses of wine every other day  . Drug use: No  . Sexual activity: Never  Other Topics Concern  . Not on file  Social History Narrative  . Not on file   Social Determinants of Health   Financial Resource Strain: Not on file  Food Insecurity: Not on file  Transportation Needs: Not on file  Physical Activity: Not on file  Stress: Not on file  Social Connections: Not on file  Intimate Partner  Violence: Not on file    Family History  Problem Relation Age of Onset  . Diabetes Mother   . Heart failure Mother     Anti-infectives: Anti-infectives (From admission, onward)   Start     Dose/Rate Route Frequency Ordered Stop   01/02/21 1700  cephALEXin (KEFLEX) capsule 500 mg        500 mg Oral 3 times daily before meals & bedtime 01/02/21 1350     01/02/21 0000  doxycycline (VIBRAMYCIN) 100 MG capsule        100 mg Oral Daily at bedtime 01/02/21 1654     12/31/20 2000  cefTRIAXone (ROCEPHIN) 2 g in sodium chloride 0.9 % 100 mL IVPB  Status:  Discontinued        2 g 200 mL/hr over 30 Minutes Intravenous Every 24 hours 12/31/20 1921 01/02/21 1350   12/31/20 1000  levofloxacin (LEVAQUIN) IVPB 500 mg  Status:  Discontinued        500 mg 100 mL/hr over 60 Minutes Intravenous Daily 12/30/20 1945 12/31/20 1921   12/30/20 1730  levofloxacin (LEVAQUIN) IVPB 500 mg        500 mg 100 mL/hr over 60 Minutes Intravenous  Once 12/30/20 1715 12/30/20 1822      Current Facility-Administered Medications  Medication Dose Route Frequency Provider Last Rate Last Admin  . acetaminophen (TYLENOL) tablet 650 mg  650 mg Oral Q6H PRN Toy Baker, MD   650 mg at 01/02/21 0804   Or  .  acetaminophen (TYLENOL) suppository 650 mg  650 mg Rectal Q6H PRN Doutova, Anastassia, MD      . amiodarone (PACERONE) tablet 200 mg  200 mg Oral Daily Doutova, Anastassia, MD   200 mg at 01/02/21 0804  . apixaban (ELIQUIS) tablet 5 mg  5 mg Oral BID Toy Baker, MD   5 mg at 01/02/21 0804  . budesonide (ENTOCORT EC) 24 hr capsule 9 mg  9 mg Oral Daily Doutova, Anastassia, MD   9 mg at 01/02/21 0804  . cephALEXin (KEFLEX) capsule 500 mg  500 mg Oral TID AC & HS Shawna Clamp, MD   500 mg at 01/02/21 1608  . feeding supplement (ENSURE ENLIVE / ENSURE PLUS) liquid 237 mL  237 mL Oral BID BM Doutova, Anastassia, MD   237 mL at 01/02/21 1513  . folic acid (FOLVITE) tablet 1 mg  1 mg Oral Daily Doutova, Anastassia, MD   1 mg at 01/02/21 0804  . HYDROcodone-acetaminophen (NORCO/VICODIN) 5-325 MG per tablet 1-2 tablet  1-2 tablet Oral Q4H PRN Doutova, Anastassia, MD      . insulin aspart (novoLOG) injection 0-9 Units  0-9 Units Subcutaneous Q4H Toy Baker, MD   7 Units at 01/02/21 1608  . insulin glargine (LANTUS) injection 15 Units  15 Units Subcutaneous QHS Toy Baker, MD   15 Units at 01/01/21 2106  . metoprolol tartrate (LOPRESSOR) tablet 50 mg  50 mg Oral BID Toy Baker, MD   50 mg at 01/01/21 2106  . tamsulosin (FLOMAX) capsule 0.4 mg  0.4 mg Oral QPC breakfast Toy Baker, MD   0.4 mg at 12/31/20 9563     Objective: Vital signs in last 24 hours: BP (!) 134/58 (BP Location: Left Arm)   Pulse (!) 54   Temp 98.6 F (37 C) (Oral)   Resp 16   Ht $R'5\' 10"'Ej$  (1.778 m)   Wt 73.4 kg   SpO2 96%   BMI 23.22 kg/m   Intake/Output from previous day: 05/28  0701 - 05/29 0700 In: 720 [P.O.:720] Out: 1500 [Urine:1500] Intake/Output this shift: Total I/O In: 240 [P.O.:240] Out: -    Physical Exam Vitals reviewed.  Constitutional:      Appearance: Normal appearance.  Abdominal:     Comments: The penile prosthesis reservoir is palpable in the left  inguinal canal but it is soft and non-tender.   Scrotum is mild/moderately edematous and he has some testicular tenderness.  The epididymis are not well palpated.   The prosthesis pump is mobile, non-tender and in the left scrotum.   He is uncircumcised and has a condom cath in place.  The urine is clear in the catheter tube.  Neurological:     Mental Status: He is alert.     Lab Results:  Results for orders placed or performed during the hospital encounter of 12/30/20 (from the past 24 hour(s))  Glucose, capillary     Status: Abnormal   Collection Time: 01/01/21  7:57 PM  Result Value Ref Range   Glucose-Capillary 299 (H) 70 - 99 mg/dL  Glucose, capillary     Status: Abnormal   Collection Time: 01/01/21 11:56 PM  Result Value Ref Range   Glucose-Capillary 299 (H) 70 - 99 mg/dL  Glucose, capillary     Status: Abnormal   Collection Time: 01/02/21  4:01 AM  Result Value Ref Range   Glucose-Capillary 195 (H) 70 - 99 mg/dL  Glucose, capillary     Status: Abnormal   Collection Time: 01/02/21  7:51 AM  Result Value Ref Range   Glucose-Capillary 145 (H) 70 - 99 mg/dL  Glucose, capillary     Status: Abnormal   Collection Time: 01/02/21 12:38 PM  Result Value Ref Range   Glucose-Capillary 272 (H) 70 - 99 mg/dL  Glucose, capillary     Status: Abnormal   Collection Time: 01/02/21  4:06 PM  Result Value Ref Range   Glucose-Capillary 306 (H) 70 - 99 mg/dL    BMET Recent Labs    12/31/20 0401  NA 135  K 4.3  CL 101  CO2 27  GLUCOSE 84  BUN 12  CREATININE 0.49*  CALCIUM 8.6*   PT/INR No results for input(s): LABPROT, INR in the last 72 hours. ABG No results for input(s): PHART, HCO3 in the last 72 hours.  Invalid input(s): PCO2, PO2 Urine culture reviewed.   Studies/Results: I have reviewed his CT from 10/21.  I have reviewed notes in [place] as well as our office notes.    Assessment/Plan: Recurrent sepsis of urinary origin with right epididymitis.   He  will need 10-14 days of therapeutic antibiotic for the epididymitis and then I will send a script for doxycycline $RemoveBeforeD'100mg'[id number]$  po qhs #30 with 5 refills for suppression to see if that will reduce his recurrent infections.  Malpositioned penile prosthesis.  The prosthesis doesn't appear to be infection but the reservoir is malpositioned in the left inguinal area.  This is chronic and doesn't require urgent surgical intervention.  Urinary frequency and incompete bladder emptying.   He has a long history of voiding dysfunction related to diabetic cystopathy.  We can address this again at office f/u.          No follow-ups on file.    CC: Shawna Clamp MD.      [name] 01/02/2021 [phone]

## 2021-01-02 NOTE — Plan of Care (Signed)
  Problem: Education: Goal: Knowledge of General Education information will improve Description: Including pain rating scale, medication(s)/side effects and non-pharmacologic comfort measures Outcome: Progressing   Problem: Clinical Measurements: Goal: Diagnostic test results will improve Outcome: Progressing   Problem: Elimination: Goal: Will not experience complications related to urinary retention Outcome: Progressing   

## 2021-01-02 NOTE — Progress Notes (Signed)
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 fib on Eliquis and hypertension who presented in the ED with few days hx. of progressive pain with urination,  Fever, scrotal pain and swelling. He is found to be confused in the ED with lactic acid 3.2,  febrile and tachycardic. Patient was admitted for sepsis secondary to epididymitis and was started on antibiotics.  Assessment & Plan:   Active Problems:   Colitis, collagenous   Essential hypertension   Acute metabolic encephalopathy   Large granular lymphocytic leukemia (HCC)   Macrocytic anemia   BPH (benign prostatic hyperplasia)   DM type 2 (diabetes mellitus, type 2) (HCC)   Paroxysmal atrial fibrillation (HCC)   Sepsis (HCC)   UTI (urinary tract infection)   Chronic anticoagulation   Acute on chronic heart failure with preserved ejection fraction (HFpEF) (HCC)   Frequent falls   Epididymitis   Dehydration   Hyponatremia   Head injury   Severe sepsis ([place])  Severe sepsis due to epididymitis: Patient presented with fever, leukocytosis, lactate greater than 3, and increasing confusion from his baseline in the setting of epididymitis, UTI. Continue ceftriaxone. Blood cultures grew Enterobacteria and E. Coli,  sensitivity pending. Urine culture grew E. coli sensitive to ceftriaxone.  Paroxysmal atrial fibrillation Heart rate controlled and remains in sinus rhythm. Continue amiodarone, metoprolol Continue Eliquis QT interval 438   T-cell leukemia Large granular lymphocytic leukemia MGUS Macrocytic anemia Last transfusion 5/5. CBC remains stable.  Hypertension >  controlled Continue metoprolol .  Chronic diastolic CHF: No evidence of fluid overload. Hold furosemide until hemodynamics clear.  BPH: Continue Flomax  Chronic diarrhea due to collagenous colitis . Continue budesonide  Hyponatremia Due to dehydration Improved.   Diabetes, type 2 complicated by recurrent UTI, well controlled . Glucose well controlled Hold home Soliqua Continue Lantus Continue SS corrections  Possible dementia Daughter reports increasing forgetfulness over last few months.  They have discussed this with his PCP at the [place], and were referred for neuropsych testing, which is pending.  Patient is normally independent, lives with daughter, does not drive due to vision, but has not had prior memory issues until the last few months.  His current confusion is worse than his recent baseline. Daughters reports he has twice recently stopped taking his antibiotics after discharge     DVT prophylaxis: Eliquis Code Status: Full code. Family Communication: No family at bed side. Disposition Plan:  Status is: Inpatient  Remains inpatient appropriate because:Inpatient level of care appropriate due to severity of illness   Dispo: The patient is from: Home              Anticipated d/c is to: Home              Patient currently is not medically stable to d/c.   Difficult to place patient No   Consultants:    Urology   Procedures:   Antimicrobials:   Anti-infectives (From admission, onward)   Start     Dose/Rate Route Frequency Ordered Stop   12/31/20 2000  cefTRIAXone (ROCEPHIN) 2 g in sodium chloride 0.9 % 100 mL IVPB        2 g 200 mL/hr over 30 Minutes Intravenous Every  24 hours 12/31/20 1921     12/31/20 1000  levofloxacin (LEVAQUIN) IVPB 500 mg  Status:  Discontinued        500 mg 100 mL/hr over 60 Minutes Intravenous Daily 12/30/20 1945 12/31/20 1921   12/30/20 1730  levofloxacin (LEVAQUIN) IVPB 500 mg        500 mg 100 mL/hr over 60 Minutes  Intravenous  Once 12/30/20 1715 12/30/20 1822      Subjective: Patient was seen and examined at bedside.  Overnight events noted.  Patient reports feeling much improved.   He is concerned about having recurrent UTIs. He wants to be discharged home.  Objective: Vitals:   01/01/21 0907 01/01/21 1322 01/01/21 2031 01/02/21 0406  BP:  107/87 (!) 144/65 (!) 136/57  Pulse: (!) 51 64 (!) 56 (!) 49  Resp:  (!) 23 (!) 22 19  Temp:  98.6 F (37 C) 98.5 F (36.9 C) 98.6 F (37 C)  TempSrc:  Oral Oral Oral  SpO2:  93% 98% 93%  Weight:      Height:        Intake/Output Summary (Last 24 hours) at 01/02/2021 1319 Last data filed at 01/02/2021 0900 Gross per 24 hour  Intake 240 ml  Output 1500 ml  Net -1260 ml   Filed Weights   12/30/20 2029  Weight: 73.4 kg    Examination:  General exam: Appears calm and comfortable, not in any acute distress. Respiratory system: Clear to auscultation. Respiratory effort normal. Cardiovascular system: S1 & S2 heard, RRR. No JVD, murmurs, rubs, gallops or clicks. No pedal edema. Gastrointestinal system: Abdomen is nondistended, soft and nontender. No organomegaly or masses felt. Normal bowel sounds heard. Central nervous system: Alert and oriented. No focal neurological deficits. Extremities: Symmetric 5 x 5 power.  No edema, no cyanosis, no clubbing. Skin: No rashes, lesions or ulcers Psychiatry: Judgement and insight appear normal. Mood & affect appropriate.     Data Reviewed: I have personally reviewed following labs and imaging studies  CBC: Recent Labs  Lab 12/30/20 1715 12/31/20 0401  WBC 18.0* 12.6*  NEUTROABS 10.7* 7.4  HGB 9.0* 10.8*  HCT 26.5* 32.3*  MCV 116.2* 112.9*  PLT 244 650*   Basic Metabolic Panel: Recent Labs  Lab 12/30/20 1715 12/30/20 1928 12/31/20 0401  NA 131*  --  135  K 4.3  --  4.3  CL 93*  --  101  CO2 27  --  27  GLUCOSE 324*  --  84  BUN 14  --  12  CREATININE 0.58*  --  0.49*  CALCIUM 9.1  --   8.6*  MG 1.9  --  1.8  PHOS  --  2.8 3.9   GFR: Estimated Creatinine Clearance: 76 mL/min (A) (by C-G formula based on SCr of 0.49 mg/dL (L)). Liver Function Tests: Recent Labs  Lab 12/30/20 1715 12/31/20 0401  AST 19 20  ALT 36 30  ALKPHOS 84 62  BILITOT 1.2 0.8  PROT 7.5 5.5*  ALBUMIN 3.7 2.9*   No results for input(s): LIPASE, AMYLASE in the last 168 hours. Recent Labs  Lab 12/30/20 1928  AMMONIA 32   Coagulation Profile: No results for input(s): INR, PROTIME in the last 168 hours. Cardiac Enzymes: Recent Labs  Lab 12/30/20 1715  CKTOTAL 20*   BNP (last 3 results) No results for input(s): PROBNP in the last 8760 hours. HbA1C: Recent Labs    12/30/20 1921  HGBA1C 9.1*   CBG: Recent Labs  Lab  01/01/21 1957 01/01/21 2356 01/02/21 0401 01/02/21 0751 01/02/21 1238  GLUCAP 299* 299* 195* 145* 272*   Lipid Profile: No results for input(s): CHOL, HDL, LDLCALC, TRIG, CHOLHDL, LDLDIRECT in the last 72 hours. Thyroid Function Tests: Recent Labs    12/31/20 0401  TSH 0.826   Anemia Panel: No results for input(s): VITAMINB12, FOLATE, FERRITIN, TIBC, IRON, RETICCTPCT in the last 72 hours. Sepsis Labs: Recent Labs  Lab 12/30/20 1715 12/30/20 1929 12/30/20 2159  PROCALCITON <0.10  --   --   LATICACIDVEN 3.2* 1.5 1.4    Recent Results (from the past 240 hour(s))  Urine culture     Status: Abnormal   Collection Time: 12/30/20  4:14 PM   Specimen: Urine, Clean Catch  Result Value Ref Range Status   Specimen Description   Final    URINE, CLEAN CATCH Performed at [facility], [place] [address], [place], [place] [address]    Special Requests   Final    NONE Performed at [facility], [place] [address], [place], [place] [address]    Culture >=100,000 COLONIES/mL ESCHERICHIA COLI (A)  Final   Report Status 01/02/2021 FINAL  Final   Organism ID, Bacteria ESCHERICHIA COLI (A)  Final      Susceptibility   Escherichia coli  - MIC*    AMPICILLIN >=32 RESISTANT Resistant     CEFAZOLIN <=4 SENSITIVE Sensitive     CEFEPIME <=0.12 SENSITIVE Sensitive     CEFTRIAXONE <=0.25 SENSITIVE Sensitive     CIPROFLOXACIN <=0.25 SENSITIVE Sensitive     GENTAMICIN <=1 SENSITIVE Sensitive     IMIPENEM <=0.25 SENSITIVE Sensitive     NITROFURANTOIN <=16 SENSITIVE Sensitive     TRIMETH/SULFA 40 SENSITIVE Sensitive     AMPICILLIN/SULBACTAM >=32 RESISTANT Resistant     PIP/TAZO 8 SENSITIVE Sensitive     * >=100,000 COLONIES/mL ESCHERICHIA COLI  Resp Panel by RT-PCR (Flu A&B, Covid) Nasopharyngeal Swab     Status: None   Collection Time: 12/30/20  4:15 PM   Specimen: Nasopharyngeal Swab; Nasopharyngeal(NP) swabs in vial transport medium  Result Value Ref Range Status   SARS Coronavirus 2 by RT PCR NEGATIVE NEGATIVE Final    Comment: (NOTE) SARS-CoV-2 target nucleic acids are NOT DETECTED.  The SARS-CoV-2 RNA is generally detectable in upper respiratory specimens during the acute phase of infection. The lowest concentration of SARS-CoV-2 viral copies this assay can detect is 138 copies/mL. A negative result does not preclude SARS-Cov-2 infection and should not be used as the sole basis for treatment or other patient management decisions. A negative result may occur with  improper specimen collection/handling, submission of specimen other than nasopharyngeal swab, presence of viral mutation(s) within the areas targeted by this assay, and inadequate number of viral copies(<138 copies/mL). A negative result must be combined with clinical observations, patient history, and epidemiological information. The expected result is Negative.  Fact Sheet for Patients:  EntrepreneurPulse.com.au  Fact Sheet for Healthcare Providers:  IncredibleEmployment.be  This test is no t yet approved or cleared by the [place] FDA and  has been authorized for detection and/or diagnosis of SARS-CoV-2 by FDA  under an Emergency Use Authorization (EUA). This EUA will remain  in effect (meaning this test can be used) for the duration of the COVID-19 declaration under Section 564(b)(1) of the Act, 21 U.S.C.section 360bbb-3(b)(1), unless the authorization is terminated  or revoked sooner.       Influenza A by PCR NEGATIVE NEGATIVE Final   Influenza B  by PCR NEGATIVE NEGATIVE Final    Comment: (NOTE) The Xpert Xpress SARS-CoV-2/FLU/RSV plus assay is intended as an aid in the diagnosis of influenza from Nasopharyngeal swab specimens and should not be used as a sole basis for treatment. Nasal washings and aspirates are unacceptable for Xpert Xpress SARS-CoV-2/FLU/RSV testing.  Fact Sheet for Patients: EntrepreneurPulse.com.au  Fact Sheet for Healthcare Providers: IncredibleEmployment.be  This test is not yet approved or cleared by the [place] FDA and has been authorized for detection and/or diagnosis of SARS-CoV-2 by FDA under an Emergency Use Authorization (EUA). This EUA will remain in effect (meaning this test can be used) for the duration of the COVID-19 declaration under Section 564(b)(1) of the Act, 21 U.S.C. section 360bbb-3(b)(1), unless the authorization is terminated or revoked.  Performed at [facility], [place] [address], [place], [place] [address]   Culture, blood (routine x 2)     Status: None (Preliminary result)   Collection Time: 12/30/20  5:15 PM   Specimen: BLOOD  Result Value Ref Range Status   Specimen Description   Final    BLOOD BLOOD LEFT HAND Performed at [place] [address] [place], [place] [address]    Special Requests   Final    BOTTLES DRAWN AEROBIC AND ANAEROBIC Blood Culture results may not be optimal due to an inadequate volume of blood received in culture bottles Performed at [place] [address], [place], [place] [address]    Culture    Final    NO GROWTH 3 DAYS Performed at [facility], [place] [address], [place], [place] [address]    Report Status PENDING  Incomplete  Culture, blood (routine x 2)     Status: Abnormal   Collection Time: 12/30/20  5:15 PM   Specimen: BLOOD  Result Value Ref Range Status   Specimen Description   Final    BLOOD BLOOD RIGHT ARM Performed at [place] [address], [place], [place] [address]    Special Requests   Final    BOTTLES DRAWN AEROBIC AND ANAEROBIC Blood Culture adequate volume Performed at [place] [address], [place], [place] [address]    Culture  Setup Time   Final    GRAM NEGATIVE RODS AEROBIC BOTTLE ONLY CRITICAL RESULT CALLED TO, READ BACK BY AND VERIFIED WITH: Cristopher Estimable PHARMD, AT 1696 12/31/20 [place] Performed at [facility], [place] [address], [place], [place] [address]    Culture ESCHERICHIA COLI (A)  Final   Report Status 01/02/2021 FINAL  Final   Organism ID, Bacteria ESCHERICHIA COLI  Final      Susceptibility   Escherichia coli - MIC*    AMPICILLIN >=32 RESISTANT Resistant     CEFAZOLIN <=4 SENSITIVE Sensitive     CEFEPIME <=0.12 SENSITIVE Sensitive     CEFTAZIDIME <=1 SENSITIVE Sensitive     CEFTRIAXONE <=0.25 SENSITIVE Sensitive     CIPROFLOXACIN <=0.25 SENSITIVE Sensitive     GENTAMICIN <=1 SENSITIVE Sensitive     IMIPENEM <=0.25 SENSITIVE Sensitive     TRIMETH/SULFA 40 SENSITIVE Sensitive     AMPICILLIN/SULBACTAM >=32 RESISTANT Resistant     PIP/TAZO <=4 SENSITIVE Sensitive     * ESCHERICHIA COLI  Blood Culture ID Panel (Reflexed)     Status: Abnormal   Collection Time: 12/30/20  5:15 PM  Result Value Ref Range Status   Enterococcus faecalis NOT DETECTED NOT DETECTED Final   Enterococcus  Faecium NOT DETECTED NOT DETECTED Final   Listeria monocytogenes NOT DETECTED NOT DETECTED Final   Staphylococcus species NOT DETECTED NOT DETECTED Final   Staphylococcus aureus (BCID) NOT  DETECTED NOT DETECTED Final   Staphylococcus epidermidis NOT DETECTED NOT DETECTED Final   Staphylococcus lugdunensis NOT DETECTED NOT DETECTED Final   Streptococcus species NOT DETECTED NOT DETECTED Final   Streptococcus agalactiae NOT DETECTED NOT DETECTED Final   Streptococcus pneumoniae NOT DETECTED NOT DETECTED Final   Streptococcus pyogenes NOT DETECTED NOT DETECTED Final   A.calcoaceticus-baumannii NOT DETECTED NOT DETECTED Final   Bacteroides fragilis NOT DETECTED NOT DETECTED Final   Enterobacterales DETECTED (A) NOT DETECTED Final    Comment: Enterobacterales represent a large order of gram negative bacteria, not a single organism.   Enterobacter cloacae complex NOT DETECTED NOT DETECTED Final   Escherichia coli DETECTED (A) NOT DETECTED Final    Comment: CRITICAL RESULT CALLED TO, READ BACK BY AND VERIFIED WITH: EMaricela Bo PHARMD, AT 1913 12/31/20 D. VANHOOK    Klebsiella aerogenes NOT DETECTED NOT DETECTED Final   Klebsiella oxytoca NOT DETECTED NOT DETECTED Final   Klebsiella pneumoniae NOT DETECTED NOT DETECTED Final   Proteus species NOT DETECTED NOT DETECTED Final   Salmonella species NOT DETECTED NOT DETECTED Final   Serratia marcescens NOT DETECTED NOT DETECTED Final   Haemophilus influenzae NOT DETECTED NOT DETECTED Final   Neisseria meningitidis NOT DETECTED NOT DETECTED Final   Pseudomonas aeruginosa NOT DETECTED NOT DETECTED Final   Stenotrophomonas maltophilia NOT DETECTED NOT DETECTED Final   Candida albicans NOT DETECTED NOT DETECTED Final   Candida auris NOT DETECTED NOT DETECTED Final   Candida glabrata NOT DETECTED NOT DETECTED Final   Candida krusei NOT DETECTED NOT DETECTED Final   Candida parapsilosis NOT DETECTED NOT DETECTED Final   Candida tropicalis NOT DETECTED NOT DETECTED Final   Cryptococcus neoformans/gattii NOT DETECTED NOT DETECTED Final   CTX-M ESBL NOT DETECTED NOT DETECTED Final   Carbapenem resistance IMP NOT DETECTED NOT DETECTED  Final   Carbapenem resistance KPC NOT DETECTED NOT DETECTED Final   Carbapenem resistance NDM NOT DETECTED NOT DETECTED Final   Carbapenem resist OXA 48 LIKE NOT DETECTED NOT DETECTED Final   Carbapenem resistance VIM NOT DETECTED NOT DETECTED Final    Comment: Performed at [facility], [address], [place], [place] [address]    Radiology Studies: No results found. Scheduled Meds: . amiodarone  200 mg Oral Daily  . apixaban  5 mg Oral BID  . budesonide  9 mg Oral Daily  . feeding supplement  237 mL Oral BID BM  . folic acid  1 mg Oral Daily  . insulin aspart  0-9 Units Subcutaneous Q4H  . insulin glargine  15 Units Subcutaneous QHS  . metoprolol tartrate  50 mg Oral BID  . tamsulosin  0.4 mg Oral QPC breakfast   Continuous Infusions: . cefTRIAXone (ROCEPHIN)  IV 2 g (01/01/21 2109)     LOS: 2 days    Time spent: 25 mins    Zykerria Tanton, MD [facility]   If 7PM-7AM, please contact night-coverage

## 2021-01-02 NOTE — Progress Notes (Signed)
Initial Nutrition Assessment  DOCUMENTATION CODES:   Not applicable  INTERVENTION:    Ensure Enlive po BID, each supplement provides 350 kcal and 20 grams of protein  MVI daily   NUTRITION DIAGNOSIS:   Increased nutrient needs related to acute illness as evidenced by estimated needs.  GOAL:   Patient will meet greater than or equal to 90% of their needs  MONITOR:   PO intake,Supplement acceptance,Weight trends,Labs,I & O's  REASON FOR ASSESSMENT:   Malnutrition Screening Tool    ASSESSMENT:   Patient with PMH significant for T-cell leukemia (large granular lymphocytic), recurrent UTIs, MGUS, DM, CHF, and HTN. Presents this admission with epididymitis.  Patient denies loss in appetite PTA. Typically consume a smaller breakfast, a sandwich for lunch, and meat, vegetable, and grain for dinner (prepared by daughters). Takes Ensure when he does have a meal. Appetite this admission stable. Last four meal completions charted as 75-100%. Patient would like to continue Ensure supplementation.   Patient reports a UBW of 160 lb and denies weight loss. Records indicate patient has maintained his weight over the last year.   Medications: folic acid, SS novolog, lantus Labs: CBG 83-280  Diet Order:   Diet Order            Diet Carb Modified Fluid consistency: Thin; Room service appropriate? Yes  Diet effective now                 EDUCATION NEEDS:   Education needs have been addressed  Skin:  Skin Assessment: Skin Integrity Issues: Skin Integrity Issues:: Other (Comment) Other: MASD- buttocks  Last BM:  5/28  Height:   Ht Readings from Last 1 Encounters:  12/30/20 5\' 10"  (1.778 m)    Weight:   Wt Readings from Last 1 Encounters:  12/30/20 73.4 kg   BMI:  Body mass index is 23.22 kg/m.  Estimated Nutritional Needs:   Kcal:  2000-2200 kcal  Protein:  100-120 grams  Fluid:  >/= 2 L/day  Mariana Single RD, LDN Clinical Nutrition Pager listed in  [place]

## 2021-01-03 DIAGNOSIS — A419 Sepsis, unspecified organism: Secondary | ICD-10-CM | POA: Diagnosis not present

## 2021-01-03 DIAGNOSIS — R6520 Severe sepsis without septic shock: Secondary | ICD-10-CM | POA: Diagnosis not present

## 2021-01-03 LAB — CBC
HCT: 23.6 % — ABNORMAL LOW (ref 39.0–52.0)
Hemoglobin: 7.6 g/dL — ABNORMAL LOW (ref 13.0–17.0)
MCH: 37.6 pg — ABNORMAL HIGH (ref 26.0–34.0)
MCHC: 32.2 g/dL (ref 30.0–36.0)
MCV: 116.8 fL — ABNORMAL HIGH (ref 80.0–100.0)
Platelets: 260 10*3/uL (ref 150–400)
RBC: 2.02 MIL/uL — ABNORMAL LOW (ref 4.22–5.81)
WBC: 10.8 10*3/uL — ABNORMAL HIGH (ref 4.0–10.5)
nRBC: 0 % (ref 0.0–0.2)

## 2021-01-03 LAB — BASIC METABOLIC PANEL
Anion gap: 3 — ABNORMAL LOW (ref 5–15)
BUN: 13 mg/dL (ref 8–23)
CO2: 29 mmol/L (ref 22–32)
Calcium: 8.7 mg/dL — ABNORMAL LOW (ref 8.9–10.3)
Chloride: 102 mmol/L (ref 98–111)
Creatinine, Ser: 0.51 mg/dL — ABNORMAL LOW (ref 0.61–1.24)
GFR, Estimated: >60 mL/min (ref >60)
Glucose, Bld: 252 mg/dL — ABNORMAL HIGH (ref 70–99)
Potassium: 4.1 mmol/L (ref 3.5–5.1)
Sodium: 134 mmol/L — ABNORMAL LOW (ref 135–145)

## 2021-01-03 LAB — PHOSPHORUS: Phosphorus: 3 mg/dL (ref 2.5–4.6)

## 2021-01-03 LAB — MAGNESIUM: Magnesium: 1.9 mg/dL (ref 1.7–2.4)

## 2021-01-03 LAB — GLUCOSE, CAPILLARY: Glucose-Capillary: 185 mg/dL — ABNORMAL HIGH (ref 70–99)

## 2021-01-03 MED ORDER — METOPROLOL TARTRATE 25 MG PO TABS: 25.0000 mg | ORAL_TABLET | Freq: Two times a day (BID) | ORAL | 0 refills | Status: DC

## 2021-01-03 MED ORDER — CEPHALEXIN 500 MG PO CAPS: 500.0000 mg | ORAL_CAPSULE | Freq: Three times a day (TID) | ORAL | 0 refills | Status: DC

## 2021-01-03 MED ORDER — METOPROLOL TARTRATE 25 MG PO TABS
25.0000 mg | ORAL_TABLET | Freq: Two times a day (BID) | ORAL | Status: DC
  Administered 2021-01-03: 25 mg via ORAL
  Filled 2021-01-03: qty 1

## 2021-01-03 MED ORDER — CEPHALEXIN 500 MG PO CAPS: 500.0000 mg | ORAL_CAPSULE | Freq: Three times a day (TID) | ORAL | 0 refills | Status: AC

## 2021-01-03 NOTE — Discharge Summary (Signed)
Physician Discharge Summary  Tommy Peterson XTG:[id number] DOB: 07-Mar-1941 DOA: 12/30/2020  PCP: [name], DO  Admit date: 12/30/2020   Discharge date: 01/03/2021  Admitted From:  Home.  Disposition:  Home.  Recommendations for Outpatient Follow-up:  1. Follow up with PCP in 1-2 weeks. 2. Please obtain BMP/CBC in one week. 3. Advised to follow-up with Urology as scheduled. 4. Advised to take Keflex 500 mg 4 times daily for 10 days. 5. Advised to take doxycycline once completes Keflex treatment. 6. Advised to hold Eliquis for 2 days and recheck CBC.  Home Health: None. Equipment/Devices: None  Discharge Condition: Stable CODE STATUS:Full code Diet recommendation: Heart Healthy   Brief Summary / Hospital Course: This 80 years old male with T-cell leukemia (large granular lymphocytic) and has been off methotrexate due to recurrent infections, complicated by transfusion dependence, recurrent UTIs / Septic shock, MGUS, penile implant, uncontrolled diabetes, diastolic CHF, paroxysmal A. fib on Eliquis and hypertension who presented in the ED with few days hx. of progressive pain with urination, Fever, scrotal pain and swelling. He is found to be confused in the ED with lactic acid 3.2,  febrile and tachycardic.  Patient was admitted for severe sepsis secondary to epididymitis and was started on IV antibiotics.  Patient was continued on IV antibiotics,  he reports pain has improved.  Urine culture grew Enterobacter and E. Coli sensitive to ceftriaxone.  Patient was seen by urologist,  recommended to continue Keflex 500 mg 4 times daily for 10 days.  Advised suppressive therapy with Bactrim 100 milligrams daily to prevent recurrent UTIs.  Patient will follow up with urologist for malpositioned penile prosthesis that does not appear infected and does not require urgent surgical intervention. Patient's hemoglobin has slightly dropped to 7.4.  Patient states his hemoglobin remains low and he  gets frequent blood transfusions.  Advised to hold Eliquis for couple of days,  check CBC and follow-up with PCP or oncologist.  Patient wants to be discharged and patient is being discharged home.  He was managed for below problems.   Discharge Diagnoses:  Active Problems:   Colitis, collagenous   Essential hypertension   Acute metabolic encephalopathy   Large granular lymphocytic leukemia (HCC)   Macrocytic anemia   BPH (benign prostatic hyperplasia)   DM type 2 (diabetes mellitus, type 2) (HCC)   Paroxysmal atrial fibrillation (HCC)   Sepsis (HCC)   UTI (urinary tract infection)   Chronic anticoagulation   Acute on chronic heart failure with preserved ejection fraction (HFpEF) (HCC)   Frequent falls   Epididymitis   Dehydration   Hyponatremia   Head injury   Severe sepsis ([place])  Severe sepsis due to epididymitis: Patient presented with fever, leukocytosis, lactate greater than 3, and increasing confusion from his baseline in the setting of epididymitis, UTI. Continue ceftriaxone. Blood cultures grew Enterobacteria and E. Coli,  sensitivity pending. Urine culture grew E. coli sensitive to ceftriaxone. Antibiotic changed to Keflex for 10 to 14 days. Sepsis physiology has resolved.  Paroxysmal atrial fibrillation Heart rate controlled and remains in sinus rhythm. Continue amiodarone, metoprolol Continue Eliquis QT interval 438   T-cell leukemia Large granular lymphocytic leukemia MGUS Macrocytic anemia Last transfusion 5/5. CBC remains stable.  Hypertension > controlled Continue metoprolol .  Chronic diastolic CHF: No evidence of fluid overload. Hold furosemide until hemodynamics clear.  BPH: Continue Flomax  Chronic diarrhea due to collagenous colitis . Continue budesonide  Hyponatremia Due to dehydration Improved.   Diabetes, type 2 complicated by  recurrent UTI, well controlled . Glucose well controlled Hold  homeSoliqua ContinueLantus ContinueSS corrections  Possible dementia Daughter reports increasing forgetfulness over last few months. They have discussed this with his PCP at the [place], and were referred for neuropsych testing, which is pending. Patient is normally independent, lives with daughter, does not drive due to vision, but has not had prior memory issues until the last few months. His current confusion is worse than his recent baseline. Daughters reports he has twice recently stopped taking his antibiotics after discharge   Discharge Instructions  Discharge Instructions    Call MD for:  extreme fatigue   Complete by: As directed    Call MD for:  persistant dizziness or light-headedness   Complete by: As directed    Call MD for:  persistant nausea and vomiting   Complete by: As directed    Call MD for:  severe uncontrolled pain   Complete by: As directed    Diet - low sodium heart healthy   Complete by: As directed    Diet Carb Modified   Complete by: As directed    Discharge instructions   Complete by: As directed    Advised to follow-up with primary care physician in 1 week. Advised to follow-up with urology as scheduled. Advised to take Keflex 500 mg 4 times daily for 10 days. Advised to take doxycycline once completes Keflex treatment.   Increase activity slowly   Complete by: As directed    No wound care   Complete by: As directed      Allergies as of 01/03/2021      Reactions   Bee Venom Swelling, Anaphylaxis   Metformin And Related Nausea And Vomiting   Sulfamethoxazole-trimethoprim Nausea And Vomiting   Bee Pollen Other (See Comments)   Unknown   Metformin Hcl Other (See Comments)   sick      Medication List    TAKE these medications   amiodarone 200 MG tablet Commonly known as: PACERONE Take 1 tablet (200 mg total) by mouth daily.   apixaban 5 MG Tabs tablet Commonly known as: Eliquis Take 1 tablet (5 mg total) by mouth 2 (two) times  daily.   B COMPLEX 100 PO Take 1 tablet by mouth daily.   BD Pen Needle Nano 2nd Gen 32G X 4 MM Misc Generic drug: Insulin Pen Needle   budesonide 3 MG 24 hr capsule Commonly known as: ENTOCORT EC Take 9 mg by mouth daily.   cephALEXin 500 MG capsule Commonly known as: KEFLEX Take 1 capsule (500 mg total) by mouth 4 (four) times daily -  before meals and at bedtime for 10 days.   Cranberry 500 MG Tabs Take 500 mg by mouth daily.   D-Mannose 500 MG Caps Take 500 mg by mouth in the morning and at bedtime.   doxycycline 100 MG capsule Commonly known as: VIBRAMYCIN Take 1 capsule (100 mg total) by mouth at bedtime.   folic acid 1 MG tablet Commonly known as: FOLVITE TAKE 1 TABLET BY MOUTH EVERY DAY   furosemide 40 MG tablet Commonly known as: LASIX Take 40 mg by mouth daily.   Insulin Glargine-Lixisenatide 100-33 UNT-MCG/ML Sopn Inject 26 Units into the skin at bedtime. Soliqua 100/33   metoprolol tartrate 25 MG tablet Commonly known as: LOPRESSOR Take 1 tablet (25 mg total) by mouth 2 (two) times daily. What changed:   medication strength  how much to take   NEURO-PS PO Take 300 mg by mouth daily.  NovoLOG FlexPen 100 UNIT/ML FlexPen Generic drug: insulin aspart Inject 6 Units into the skin 3 (three) times daily as needed for high blood sugar.   omeprazole 40 MG capsule Commonly known as: PRILOSEC Take 40 mg by mouth in the morning and at bedtime.   pioglitazone 15 MG tablet Commonly known as: ACTOS Take 15 mg by mouth every morning.   tamsulosin 0.4 MG Caps capsule Commonly known as: FLOMAX Take 1 capsule (0.4 mg total) by mouth daily after breakfast.   traZODone 50 MG tablet Commonly known as: DESYREL Take 50-100 mg by mouth at bedtime as needed for sleep.   Vitamin B-12 5000 MCG Subl Place 5,000 mcg under the tongue daily.       Follow-up Information    [name], MD Follow up.   Specialty: Urology Why: the office will call to arrange  f/u with me or one of the nurse practitioners in the next 2-3 weeks.  Contact information: [place] [address] [phone]        [name], DO Follow up in 1 week(s).   Specialty: Family Medicine Contact information: [address] [place] [address] [phone]        [name], MD .   Specialty: Cardiology Contact information: [address] [place] [address] [place] [address] [phone]              Allergies  Allergen Reactions  . Bee Venom Swelling and Anaphylaxis  . Metformin And Related Nausea And Vomiting  . Sulfamethoxazole-Trimethoprim Nausea And Vomiting  . Bee Pollen Other (See Comments)    Unknown   . Metformin Hcl Other (See Comments)    sick     Consultations:  Urology   Procedures/Studies: DG Shoulder Right  Result Date: 12/09/2020 CLINICAL DATA:  fall EXAM: RIGHT SHOULDER - 2+ VIEW COMPARISON:  None. FINDINGS: There is no evidence of fracture. There is mild glenohumeral and AC joint degenerative change. IMPRESSION: No evidence of acute fracture. Mild glenohumeral and AC joint degenerative change. Electronically Signed   By: Maurine Simmering   On: 12/09/2020 13:48   CT HEAD WO CONTRAST  Result Date: 12/30/2020 CLINICAL DATA:  Head trauma. EXAM: CT HEAD WITHOUT CONTRAST TECHNIQUE: Contiguous axial images were obtained from the base of the skull through the vertex without intravenous contrast. COMPARISON:  Head CT 12/09/2020 FINDINGS: Brain: Mild motion artifact despite repeat acquisition. Stable degree of atrophy and chronic small vessel ischemia. No intracranial hemorrhage, mass effect, or midline shift. No hydrocephalus. The basilar cisterns are patent. No evidence of territorial infarct or acute ischemia. No extra-axial or intracranial fluid collection. Vascular: Atherosclerosis of skullbase vasculature without  CT Head Wo Contrast  Result Date: 12/09/2020 CLINICAL DATA:  Altered mental status.  Unspecified minor head trauma. EXAM: CT HEAD WITHOUT CONTRAST TECHNIQUE: Contiguous axial images were obtained from the base of the skull through the vertex without intravenous contrast. COMPARISON:  11/29/2020 FINDINGS: Brain: No significant change in mild-to-moderate enlargement of the ventricles and subarachnoid spaces. Stable mild patchy white matter low density in both cerebral hemispheres. No intracranial hemorrhage, mass lesion or CT evidence of acute infarction. Vascular: No hyperdense vessel or unexpected calcification. Skull: Normal. Negative for fracture or focal lesion. Sinuses/Orbits: Status post bilateral cataract extraction. Mild bilateral frontal, ethmoid and maxillary sinus mucosal thickening. Other: None. IMPRESSION: 1. No acute abnormality. 2. Mild-to-moderate atrophy and mild chronic small vessel white matter ischemic changes in both cerebral hemispheres. Electronically Signed   By: Remo Lipps  Joneen Caraway M.D.   On: 12/09/2020 12:27   DG Chest Port 1 View  Result Date: 12/09/2020 CLINICAL DATA:  Possible sepsis. EXAM: PORTABLE CHEST 1 VIEW COMPARISON:  11/06/2020 FINDINGS: Cardiac enlargement and aortic atherosclerosis. Unchanged. Loop recorder is noted along the projection of the left heart border. Chronic coarsened interstitial markings are noted throughout both lungs. No signs of pleural effusion. No airspace consolidation identified. IMPRESSION: 1. No acute cardiopulmonary abnormalities 2. Cardiac enlargement Aortic Atherosclerosis (ICD10-I70.0). Electronically Signed   By: Kerby Moors M.D.   On: 12/09/2020 10:40   DG Shoulder Left  Result Date: 12/09/2020 CLINICAL DATA:  Fall several days ago.  Shoulder pain EXAM: LEFT SHOULDER - 2+ VIEW COMPARISON:  None. FINDINGS: Mild widening of the [facility] joint. No change from prior chest x-ray 07/28/2018 Negative for fracture.  Shoulder joint normal. IMPRESSION: Mild AC separation, chronic.  Negative for fracture Electronically Signed   By: Franchot Gallo  M.D.   On: 12/09/2020 13:48   US SCROTUM W/DOPPLER  Result Date: 12/30/2020 CLINICAL DATA:  Right testicle pain with fever EXAM: SCROTAL ULTRASOUND DOPPLER ULTRASOUND OF THE TESTICLES TECHNIQUE: Complete ultrasound examination of the testicles, epididymis, and other scrotal structures was performed. Color and spectral Doppler ultrasound were also utilized to evaluate blood flow to the testicles. COMPARISON:  None. FINDINGS: Right testicle Measurements: 4.7 x 2.1 x 4.1 cm. No mass or microlithiasis visualized. Left testicle Measurements: 4.8 x 2.4 x 2.4 cm. No mass or microlithiasis visualized. Heterogeneous echotexture without discrete mass. Right epididymis:  Heterogenous and enlarged, slightly hyperemic. Left epididymis:  Normal in size and appearance. Hydrocele:  Small right-sided hydrocele. Varicocele:  None visualized. Pulsed Doppler interrogation of both testes demonstrates normal low resistance arterial and venous waveforms bilaterally. IMPRESSION: 1. Heterogenous, enlarged right epididymis with slight increased vascularity suspicious for epididymitis. 2. Slightly smaller diffusely heterogeneous left testis compared to the right, question mild atrophy and previous insult. Slightly asymmetric diminished flow to left testis but without definitive evidence for acute torsion. Electronically Signed   By: Donavan Foil M.D.   On: 12/30/2020 16:56     Subjective: Patient was seen and examined at bedside.  Overnight events noted. Patient reports feeling much improved.  he wants to be discharged home.  Discharge Exam: Vitals:   01/03/21 0558 01/03/21 0623  BP: (!) 167/73 (!) 144/63  Pulse: (!) 50 (!) 47  Resp: 18   Temp: 98.2 F (36.8 C)   SpO2: 96%    Vitals:   01/02/21 1523 01/02/21 2125 01/03/21 0558 01/03/21 0623  BP: (!) 134/58 (!) 140/52 (!) 167/73 (!) 144/63  Pulse: (!) 54 (!) 57 (!) 50 (!) 47  Resp: 16 18 18    Temp: 98.6 F (37 C) 98.4 F (36.9 C) 98.2 F (36.8 C)   TempSrc: Oral  Oral Oral   SpO2: 96% 93% 96%   Weight:      Height:        General: Pt is alert, awake, not in acute distress Cardiovascular: RRR, S1/S2 +, no rubs, no gallops Respiratory: CTA bilaterally, no wheezing, no rhonchi Abdominal: Soft, NT, ND, bowel sounds + Extremities: no edema, no cyanosis    The results of significant diagnostics from this hospitalization (including imaging, microbiology, ancillary and laboratory) are listed below for reference.     Microbiology: Recent Results (from the past 240 hour(s))  Urine culture     Status: Abnormal   Collection Time: 12/30/20  4:14 PM   Specimen: Urine, Clean Catch  Result Value Ref  Range Status   Specimen Description   Final    URINE, CLEAN CATCH Performed at [facility], [place] [address] [place], [place] [address]    Special Requests   Final    NONE Performed at [facility], [place] [address] [place], [place] [address]    Culture >=100,000 COLONIES/mL ESCHERICHIA COLI (A)  Final   Report Status 01/02/2021 FINAL  Final   Organism ID, Bacteria ESCHERICHIA COLI (A)  Final      Susceptibility   Escherichia coli - MIC*    AMPICILLIN >=32 RESISTANT Resistant     CEFAZOLIN <=4 SENSITIVE Sensitive     CEFEPIME <=0.12 SENSITIVE Sensitive     CEFTRIAXONE <=0.25 SENSITIVE Sensitive     CIPROFLOXACIN <=0.25 SENSITIVE Sensitive     GENTAMICIN <=1 SENSITIVE Sensitive     IMIPENEM <=0.25 SENSITIVE Sensitive     NITROFURANTOIN <=16 SENSITIVE Sensitive     TRIMETH/SULFA 40 SENSITIVE Sensitive     AMPICILLIN/SULBACTAM >=32 RESISTANT Resistant     PIP/TAZO 8 SENSITIVE Sensitive     * >=100,000 COLONIES/mL ESCHERICHIA COLI  Resp Panel by RT-PCR (Flu A&B, Covid) Nasopharyngeal Swab     Status: None   Collection Time: 12/30/20  4:15 PM   Specimen: Nasopharyngeal Swab; Nasopharyngeal(NP) swabs in vial transport medium  Result Value Ref Range Status   SARS Coronavirus 2 by RT PCR NEGATIVE NEGATIVE Final     Comment: (NOTE) SARS-CoV-2 target nucleic acids are NOT DETECTED.  The SARS-CoV-2 RNA is generally detectable in upper respiratory specimens during the acute phase of infection. The lowest concentration of SARS-CoV-2 viral copies this assay can detect is 138 copies/mL. A negative result does not preclude SARS-Cov-2 infection and should not be used as the sole basis for treatment or other patient management decisions. A negative result may occur with  improper specimen collection/handling, submission of specimen other than nasopharyngeal swab, presence of viral mutation(s) within the areas targeted by this assay, and inadequate number of viral copies(<138 copies/mL). A negative result must be combined with clinical observations, patient history, and epidemiological information. The expected result is Negative.  Fact Sheet for Patients:  EntrepreneurPulse.com.au  Fact Sheet for Healthcare Providers:  IncredibleEmployment.be  This test is no t yet approved or cleared by the [place] FDA and  has been authorized for detection and/or diagnosis of SARS-CoV-2 by FDA under an Emergency Use Authorization (EUA). This EUA will remain  in effect (meaning this test can be used) for the duration of the COVID-19 declaration under Section 564(b)(1) of the Act, 21 U.S.C.section 360bbb-3(b)(1), unless the authorization is terminated  or revoked sooner.       Influenza A by PCR NEGATIVE NEGATIVE Final   Influenza B by PCR NEGATIVE NEGATIVE Final    Comment: (NOTE) The Xpert Xpress SARS-CoV-2/FLU/RSV plus assay is intended as an aid in the diagnosis of influenza from Nasopharyngeal swab specimens and should not be used as a sole basis for treatment. Nasal washings and aspirates are unacceptable for Xpert Xpress SARS-CoV-2/FLU/RSV testing.  Fact Sheet for Patients: EntrepreneurPulse.com.au  Fact Sheet for Healthcare  Providers: IncredibleEmployment.be  This test is not yet approved or cleared by the [place] FDA and has been authorized for detection and/or diagnosis of SARS-CoV-2 by FDA under an Emergency Use Authorization (EUA). This EUA will remain in effect (meaning this test can be used) for the duration of the COVID-19 declaration under Section 564(b)(1) of the Act, 21 U.S.C. section 360bbb-3(b)(1), unless the authorization is terminated  or revoked.  Performed at [facility], [place] [address] [place], [place] [address]   Culture, blood (routine x 2)     Status: None (Preliminary result)   Collection Time: 12/30/20  5:15 PM   Specimen: BLOOD  Result Value Ref Range Status   Specimen Description   Final    BLOOD BLOOD LEFT HAND Performed at [place] [address], [place], [place] [address]    Special Requests   Final    BOTTLES DRAWN AEROBIC AND ANAEROBIC Blood Culture results may not be optimal due to an inadequate volume of blood received in culture bottles Performed at [place] [address], [place], [place] [address]    Culture   Final    NO GROWTH 4 DAYS Performed at [facility], [place] [address], [place], [place] [address]    Report Status PENDING  Incomplete  Culture, blood (routine x 2)     Status: Abnormal   Collection Time: 12/30/20  5:15 PM   Specimen: BLOOD  Result Value Ref Range Status   Specimen Description   Final    BLOOD BLOOD RIGHT ARM Performed at [place] [address] [place], [place] [address]    Special Requests   Final    BOTTLES DRAWN AEROBIC AND ANAEROBIC Blood Culture adequate volume Performed at [place] [address], [place], [place] [address]    Culture  Setup Time   Final    GRAM NEGATIVE RODS AEROBIC BOTTLE ONLY CRITICAL RESULT CALLED TO, READ BACK BY AND VERIFIED WITH: Cristopher Estimable PHARMD, AT 9622  12/31/20 [place] Performed at [facility], [place] [address], [place],  [address]    Culture ESCHERICHIA COLI (A)  Final   Report Status 01/02/2021 FINAL  Final   Organism ID, Bacteria ESCHERICHIA COLI  Final      Susceptibility   Escherichia coli - MIC*    AMPICILLIN >=32 RESISTANT Resistant     CEFAZOLIN <=4 SENSITIVE Sensitive     CEFEPIME <=0.12 SENSITIVE Sensitive     CEFTAZIDIME <=1 SENSITIVE Sensitive     CEFTRIAXONE <=0.25 SENSITIVE Sensitive     CIPROFLOXACIN <=0.25 SENSITIVE Sensitive     GENTAMICIN <=1 SENSITIVE Sensitive     IMIPENEM <=0.25 SENSITIVE Sensitive     TRIMETH/SULFA 40 SENSITIVE Sensitive     AMPICILLIN/SULBACTAM >=32 RESISTANT Resistant     PIP/TAZO <=4 SENSITIVE Sensitive     * ESCHERICHIA COLI  Blood Culture ID Panel (Reflexed)     Status: Abnormal   Collection Time: 12/30/20  5:15 PM  Result Value Ref Range Status   Enterococcus faecalis NOT DETECTED NOT DETECTED Final   Enterococcus Faecium NOT DETECTED NOT DETECTED Final   Listeria monocytogenes NOT DETECTED NOT DETECTED Final   Staphylococcus species NOT DETECTED NOT DETECTED Final   Staphylococcus aureus (BCID) NOT DETECTED NOT DETECTED Final   Staphylococcus epidermidis NOT DETECTED NOT DETECTED Final   Staphylococcus lugdunensis NOT DETECTED NOT DETECTED Final   Streptococcus species NOT DETECTED NOT DETECTED Final   Streptococcus agalactiae NOT DETECTED NOT DETECTED Final   Streptococcus pneumoniae NOT DETECTED NOT DETECTED Final   Streptococcus pyogenes NOT DETECTED NOT DETECTED Final   A.calcoaceticus-baumannii NOT DETECTED NOT DETECTED Final   Bacteroides fragilis NOT DETECTED NOT DETECTED Final   Enterobacterales DETECTED (A) NOT DETECTED Final    Comment: Enterobacterales represent a large order of gram negative bacteria, not a single organism.  Enterobacter cloacae complex NOT DETECTED NOT DETECTED Final   Escherichia coli DETECTED (A) NOT DETECTED Final    Comment:  CRITICAL RESULT CALLED TO, READ BACK BY AND VERIFIED WITH: EMaricela Bo PHARMD, AT 1913 12/31/20 D. VANHOOK    Klebsiella aerogenes NOT DETECTED NOT DETECTED Final   Klebsiella oxytoca NOT DETECTED NOT DETECTED Final   Klebsiella pneumoniae NOT DETECTED NOT DETECTED Final   Proteus species NOT DETECTED NOT DETECTED Final   Salmonella species NOT DETECTED NOT DETECTED Final   Serratia marcescens NOT DETECTED NOT DETECTED Final   Haemophilus influenzae NOT DETECTED NOT DETECTED Final   Neisseria meningitidis NOT DETECTED NOT DETECTED Final   Pseudomonas aeruginosa NOT DETECTED NOT DETECTED Final   Stenotrophomonas maltophilia NOT DETECTED NOT DETECTED Final   Candida albicans NOT DETECTED NOT DETECTED Final   Candida auris NOT DETECTED NOT DETECTED Final   Candida glabrata NOT DETECTED NOT DETECTED Final   Candida krusei NOT DETECTED NOT DETECTED Final   Candida parapsilosis NOT DETECTED NOT DETECTED Final   Candida tropicalis NOT DETECTED NOT DETECTED Final   Cryptococcus neoformans/gattii NOT DETECTED NOT DETECTED Final   CTX-M ESBL NOT DETECTED NOT DETECTED Final   Carbapenem resistance IMP NOT DETECTED NOT DETECTED Final   Carbapenem resistance KPC NOT DETECTED NOT DETECTED Final   Carbapenem resistance NDM NOT DETECTED NOT DETECTED Final   Carbapenem resist OXA 48 LIKE NOT DETECTED NOT DETECTED Final   Carbapenem resistance VIM NOT DETECTED NOT DETECTED Final    Comment: Performed at [facility], [address] [place], [place] [address]     Labs: BNP (last 3 results) Recent Labs    11/06/20 0450  BNP 962.9*   Basic Metabolic Panel: Recent Labs  Lab 12/30/20 1715 12/30/20 1928 12/31/20 0401 01/03/21 0957  NA 131*  --  135 134*  K 4.3  --  4.3 4.1  CL 93*  --  101 102  CO2 27  --  27 29  GLUCOSE 324*  --  84 252*  BUN 14  --  12 13  CREATININE 0.58*  --  0.49* 0.51*  CALCIUM 9.1  --  8.6* 8.7*  MG 1.9  --  1.8 1.9  PHOS  --  2.8 3.9 3.0   Liver Function  Tests: Recent Labs  Lab 12/30/20 1715 12/31/20 0401  AST 19 20  ALT 36 30  ALKPHOS 84 62  BILITOT 1.2 0.8  PROT 7.5 5.5*  ALBUMIN 3.7 2.9*   No results for input(s): LIPASE, AMYLASE in the last 168 hours. Recent Labs  Lab 12/30/20 1928  AMMONIA 32   CBC: Recent Labs  Lab 12/30/20 1715 12/31/20 0401 01/03/21 0957  WBC 18.0* 12.6* 10.8*  NEUTROABS 10.7* 7.4  --   HGB 9.0* 10.8* 7.6*  HCT 26.5* 32.3* 23.6*  MCV 116.2* 112.9* 116.8*  PLT 244 121* 260   Cardiac Enzymes: Recent Labs  Lab 12/30/20 1715  CKTOTAL 20*   BNP: Invalid input(s): POCBNP CBG: Recent Labs  Lab 01/02/21 0751 01/02/21 1238 01/02/21 1606 01/02/21 2129 01/03/21 0741  GLUCAP 145* 272* 306* 260* 185*   D-Dimer No results for input(s): DDIMER in the last 72 hours. Hgb A1c No results for input(s): HGBA1C in the last 72 hours. Lipid Profile No results for input(s): CHOL, HDL, LDLCALC, TRIG, CHOLHDL, LDLDIRECT in the last 72 hours. Thyroid function studies No results for input(s): TSH, T4TOTAL, T3FREE, THYROIDAB in the last 72 hours.  Invalid input(s): FREET3 Anemia work up No  results for input(s): VITAMINB12, FOLATE, FERRITIN, TIBC, IRON, RETICCTPCT in the last 72 hours. Urinalysis    Component Value Date/Time   COLORURINE YELLOW 12/30/2020 1614   APPEARANCEUR CLOUDY (A) 12/30/2020 1614   LABSPEC 1.020 12/30/2020 1614   PHURINE 8.0 12/30/2020 1614   GLUCOSEU >=500 (A) 12/30/2020 1614   HGBUR NEGATIVE 12/30/2020 1614   BILIRUBINUR NEGATIVE 12/30/2020 1614   BILIRUBINUR negative 10/29/2017 1428   KETONESUR 20 (A) 12/30/2020 1614   PROTEINUR NEGATIVE 12/30/2020 1614   UROBILINOGEN 0.2 10/29/2017 1428   NITRITE POSITIVE (A) 12/30/2020 1614   LEUKOCYTESUR LARGE (A) 12/30/2020 1614   Sepsis Labs Invalid input(s): PROCALCITONIN,  WBC,  LACTICIDVEN Microbiology Recent Results (from the past 240 hour(s))  Urine culture     Status: Abnormal   Collection Time: 12/30/20  4:14 PM    Specimen: Urine, Clean Catch  Result Value Ref Range Status   Specimen Description   Final    URINE, CLEAN CATCH Performed at [facility], [place] [address] [place], [place] [address]    Special Requests   Final    NONE Performed at [facility], [place] [address] [place], [place] [address]    Culture >=100,000 COLONIES/mL ESCHERICHIA COLI (A)  Final   Report Status 01/02/2021 FINAL  Final   Organism ID, Bacteria ESCHERICHIA COLI (A)  Final      Susceptibility   Escherichia coli - MIC*    AMPICILLIN >=32 RESISTANT Resistant     CEFAZOLIN <=4 SENSITIVE Sensitive     CEFEPIME <=0.12 SENSITIVE Sensitive     CEFTRIAXONE <=0.25 SENSITIVE Sensitive     CIPROFLOXACIN <=0.25 SENSITIVE Sensitive     GENTAMICIN <=1 SENSITIVE Sensitive     IMIPENEM <=0.25 SENSITIVE Sensitive     NITROFURANTOIN <=16 SENSITIVE Sensitive     TRIMETH/SULFA 40 SENSITIVE Sensitive     AMPICILLIN/SULBACTAM >=32 RESISTANT Resistant     PIP/TAZO 8 SENSITIVE Sensitive     * >=100,000 COLONIES/mL ESCHERICHIA COLI  Resp Panel by RT-PCR (Flu A&B, Covid) Nasopharyngeal Swab     Status: None   Collection Time: 12/30/20  4:15 PM   Specimen: Nasopharyngeal Swab; Nasopharyngeal(NP) swabs in vial transport medium  Result Value Ref Range Status   SARS Coronavirus 2 by RT PCR NEGATIVE NEGATIVE Final    Comment: (NOTE) SARS-CoV-2 target nucleic acids are NOT DETECTED.  The SARS-CoV-2 RNA is generally detectable in upper respiratory specimens during the acute phase of infection. The lowest concentration of SARS-CoV-2 viral copies this assay can detect is 138 copies/mL. A negative result does not preclude SARS-Cov-2 infection and should not be used as the sole basis for treatment or other patient management decisions. A negative result may occur with  improper specimen collection/handling, submission of specimen other than nasopharyngeal swab, presence of viral mutation(s) within the areas  targeted by this assay, and inadequate number of viral copies(<138 copies/mL). A negative result must be combined with clinical observations, patient history, and epidemiological information. The expected result is Negative.  Fact Sheet for Patients:  EntrepreneurPulse.com.au  Fact Sheet for Healthcare Providers:  IncredibleEmployment.be  This test is no t yet approved or cleared by the [place] FDA and  has been authorized for detection and/or diagnosis of SARS-CoV-2 by FDA under an Emergency Use Authorization (EUA). This EUA will remain  in effect (meaning this test can be used) for the duration of the COVID-19 declaration under Section 564(b)(1) of the Act, 21 U.S.C.section 360bbb-3(b)(1), unless the authorization is terminated  or  revoked sooner.       Influenza A by PCR NEGATIVE NEGATIVE Final   Influenza B by PCR NEGATIVE NEGATIVE Final    Comment: (NOTE) The Xpert Xpress SARS-CoV-2/FLU/RSV plus assay is intended as an aid in the diagnosis of influenza from Nasopharyngeal swab specimens and should not be used as a sole basis for treatment. Nasal washings and aspirates are unacceptable for Xpert Xpress SARS-CoV-2/FLU/RSV testing.  Fact Sheet for Patients: EntrepreneurPulse.com.au  Fact Sheet for Healthcare Providers: IncredibleEmployment.be  This test is not yet approved or cleared by the [place] FDA and has been authorized for detection and/or diagnosis of SARS-CoV-2 by FDA under an Emergency Use Authorization (EUA). This EUA will remain in effect (meaning this test can be used) for the duration of the COVID-19 declaration under Section 564(b)(1) of the Act, 21 U.S.C. section 360bbb-3(b)(1), unless the authorization is terminated or revoked.  Performed at [facility], [place] [address] [place], [place] [address]   Culture, blood (routine x 2)     Status: None  (Preliminary result)   Collection Time: 12/30/20  5:15 PM   Specimen: BLOOD  Result Value Ref Range Status   Specimen Description   Final    BLOOD BLOOD LEFT HAND Performed at [place] [address] [place], [place] [address]    Special Requests   Final    BOTTLES DRAWN AEROBIC AND ANAEROBIC Blood Culture results may not be optimal due to an inadequate volume of blood received in culture bottles Performed at [place] [address] [place], [place] [address]    Culture   Final    NO GROWTH 4 DAYS Performed at [facility], [place] [address], [place], [place] [address]    Report Status PENDING  Incomplete  Culture, blood (routine x 2)     Status: Abnormal   Collection Time: 12/30/20  5:15 PM   Specimen: BLOOD  Result Value Ref Range Status   Specimen Description   Final    BLOOD BLOOD RIGHT ARM Performed at [place] [address], [place], [place] [address]    Special Requests   Final    BOTTLES DRAWN AEROBIC AND ANAEROBIC Blood Culture adequate volume Performed at [place] [address], [place], [place] [address]    Culture  Setup Time   Final    GRAM NEGATIVE RODS AEROBIC BOTTLE ONLY CRITICAL RESULT CALLED TO, READ BACK BY AND VERIFIED WITH: Cristopher Estimable PHARMD, AT 2694 12/31/20 [place] Performed at [facility], [place] [address] [place], [place] [address]    Culture ESCHERICHIA COLI (A)  Final   Report Status 01/02/2021 FINAL  Final   Organism ID, Bacteria ESCHERICHIA COLI  Final      Susceptibility   Escherichia coli - MIC*    AMPICILLIN >=32 RESISTANT Resistant     CEFAZOLIN <=4 SENSITIVE Sensitive     CEFEPIME <=0.12 SENSITIVE Sensitive     CEFTAZIDIME <=1 SENSITIVE Sensitive     CEFTRIAXONE <=0.25 SENSITIVE Sensitive     CIPROFLOXACIN <=0.25 SENSITIVE Sensitive     GENTAMICIN <=1 SENSITIVE Sensitive     IMIPENEM <=0.25 SENSITIVE Sensitive      TRIMETH/SULFA 40 SENSITIVE Sensitive     AMPICILLIN/SULBACTAM >=32 RESISTANT Resistant     PIP/TAZO <=4 SENSITIVE Sensitive     * ESCHERICHIA COLI  Blood Culture ID Panel (Reflexed)     Status: Abnormal   Collection Time: 12/30/20  5:15 PM  Result Value Ref Range Status   Enterococcus faecalis NOT DETECTED NOT DETECTED Final   Enterococcus Faecium NOT DETECTED NOT DETECTED Final   Listeria monocytogenes NOT DETECTED NOT DETECTED Final   Staphylococcus species NOT DETECTED NOT DETECTED Final   Staphylococcus aureus (BCID) NOT DETECTED NOT DETECTED Final   Staphylococcus epidermidis NOT DETECTED NOT DETECTED Final   Staphylococcus lugdunensis NOT DETECTED NOT DETECTED Final   Streptococcus species NOT DETECTED NOT DETECTED Final   Streptococcus agalactiae NOT DETECTED NOT DETECTED Final   Streptococcus pneumoniae NOT DETECTED NOT DETECTED Final   Streptococcus pyogenes NOT DETECTED NOT DETECTED Final   A.calcoaceticus-baumannii NOT DETECTED NOT DETECTED Final   Bacteroides fragilis NOT DETECTED NOT DETECTED Final   Enterobacterales DETECTED (A) NOT DETECTED Final    Comment: Enterobacterales represent a large order of gram negative bacteria, not a single organism.   Enterobacter cloacae complex NOT DETECTED NOT DETECTED Final   Escherichia coli DETECTED (A) NOT DETECTED Final    Comment: CRITICAL RESULT CALLED TO, READ BACK BY AND VERIFIED WITH: EMaricela Bo PHARMD, AT 1913 12/31/20 D. VANHOOK    Klebsiella aerogenes NOT DETECTED NOT DETECTED Final   Klebsiella oxytoca NOT DETECTED NOT DETECTED Final   Klebsiella pneumoniae NOT DETECTED NOT DETECTED Final   Proteus species NOT DETECTED NOT DETECTED Final   Salmonella species NOT DETECTED NOT DETECTED Final   Serratia marcescens NOT DETECTED NOT DETECTED Final   Haemophilus influenzae NOT DETECTED NOT DETECTED Final   Neisseria meningitidis NOT DETECTED NOT DETECTED Final   Pseudomonas aeruginosa NOT DETECTED NOT DETECTED Final    Stenotrophomonas maltophilia NOT DETECTED NOT DETECTED Final   Candida albicans NOT DETECTED NOT DETECTED Final   Candida auris NOT DETECTED NOT DETECTED Final   Candida glabrata NOT DETECTED NOT DETECTED Final   Candida krusei NOT DETECTED NOT DETECTED Final   Candida parapsilosis NOT DETECTED NOT DETECTED Final   Candida tropicalis NOT DETECTED NOT DETECTED Final   Cryptococcus neoformans/gattii NOT DETECTED NOT DETECTED Final   CTX-M ESBL NOT DETECTED NOT DETECTED Final   Carbapenem resistance IMP NOT DETECTED NOT DETECTED Final   Carbapenem resistance KPC NOT DETECTED NOT DETECTED Final   Carbapenem resistance NDM NOT DETECTED NOT DETECTED Final   Carbapenem resist OXA 48 LIKE NOT DETECTED NOT DETECTED Final   Carbapenem resistance VIM NOT DETECTED NOT DETECTED Final    Comment: Performed at [facility], [address] [place], [place] [address]     Time coordinating discharge: Over 30 minutes  SIGNED:   Shawna Clamp, MD  [facility] 01/03/2021, 4:21 PM Pager   If 7PM-7AM, please contact night-coverage [email or web address]

## 2021-01-03 NOTE — Discharge Instructions (Signed)
Advised to follow-up with primary care physician in 1 week. Advised to follow-up with urology as scheduled. Advised to take Keflex 4 times daily for 10 days.

## 2021-01-03 NOTE — TOC Transition Note (Signed)
Transition of Care [facility]) - CM/SW Discharge Note   Patient Details  Name: RUTLEDGE SELSOR MRN: [id number] Date of Birth: 1940/09/15  [facility] CM/SW Contact:  Lennart Pall, LCSW Phone Number: 01/03/2021, 12:29 PM   Clinical Narrative:    Spoke with pt and wife today to discuss [facility] follow up orders.  Very unclear at this point if this may have already been arranged by SW at [facility] in [place]. Will not be able to reach the [facility] until tomorrow.  W[facility] aware [facility] and, if they [facility] not [facility], then I will make referral.  Will confirm with wife once this has been done.   Final next level of care: Home w Home Health Services Barriers to Discharge: Barriers Resolved   Patient Goals and CMS Choice Patient states their goals for this hospitalization and ongoing recovery are:: return home      Discharge Placement                       Discharge Plan and Services                DME Arranged: N/A DME Agency: NA                  Social Determinants of Health (SDOH) Interventions     Readmission Risk Interventions No flowsheet data found.

## 2021-01-04 LAB — CULTURE, BLOOD (ROUTINE X 2): Culture: NO GROWTH

## 2021-01-05 NOTE — Telephone Encounter (Signed)
   Name: Tommy Peterson  DOB: [birth year]  MRN: [id number]   Primary Cardiologist: [name], MD  Chart reviewed as part of pre-operative protocol coverage. Patient was contacted 01/05/2021 in reference to pre-operative risk assessment for pending surgery as outlined below.  NICKALAUS CROOKE was last seen on 12/02/20 by Dr. Sallyanne Kuster.  Since that day, CYPRUS KUANG was admitted 12/30/20-01/03/21 with sepsis due to epididymitis treated with IV antibiotics.   Spoke with him and his wife. He is uncertain whether he is going to have the procedure performed. He reports no new chest pain, shortness of breath, nor other cardiac symptoms. He and his wife are understandably hesitant regarding medical procedures and prefer to follow up in clinic prior to making that decision.   He has upcoming appointment 02/21/21 and preop clearance will be addressed at that time. I have updated the appointment notes.  He and his wife were appreciative of the call.   I will route to the requesting party via Epic fax function to make them aware and remove from preop pool.  Loel Dubonnet, NP 01/05/2021, 9:44 AM

## 2021-01-07 DIAGNOSIS — R351 Nocturia: Secondary | ICD-10-CM | POA: Diagnosis not present

## 2021-01-07 DIAGNOSIS — N3941 Urge incontinence: Secondary | ICD-10-CM | POA: Diagnosis not present

## 2021-01-07 DIAGNOSIS — T83490D Other mechanical complication of penile (implanted) prosthesis, subsequent encounter: Secondary | ICD-10-CM | POA: Diagnosis not present

## 2021-01-07 DIAGNOSIS — N3020 Other chronic cystitis without hematuria: Secondary | ICD-10-CM | POA: Diagnosis not present

## 2021-01-14 ENCOUNTER — Inpatient Hospital Stay: Payer: Medicare HMO | Attending: Hematology

## 2021-01-14 ENCOUNTER — Other Ambulatory Visit: Payer: Self-pay

## 2021-01-14 DIAGNOSIS — C91Z0 Other lymphoid leukemia not having achieved remission: Secondary | ICD-10-CM | POA: Insufficient documentation

## 2021-01-14 DIAGNOSIS — D649 Anemia, unspecified: Secondary | ICD-10-CM

## 2021-01-14 DIAGNOSIS — D6489 Other specified anemias: Secondary | ICD-10-CM | POA: Diagnosis not present

## 2021-01-14 LAB — CMP (CANCER CENTER ONLY)
ALT: 33 U/L (ref 0–44)
AST: 14 U/L — ABNORMAL LOW (ref 15–41)
Albumin: 3.4 g/dL — ABNORMAL LOW (ref 3.5–5.0)
Alkaline Phosphatase: 81 U/L (ref 38–126)
Anion gap: 10 (ref 5–15)
BUN: 14 mg/dL (ref 8–23)
CO2: 27 mmol/L (ref 22–32)
Calcium: 9.2 mg/dL (ref 8.9–10.3)
Chloride: 98 mmol/L (ref 98–111)
Creatinine: 0.81 mg/dL (ref 0.61–1.24)
GFR, Estimated: >60 mL/min (ref >60)
Glucose, Bld: 468 mg/dL — ABNORMAL HIGH (ref 70–99)
Potassium: 4.2 mmol/L (ref 3.5–5.1)
Sodium: 135 mmol/L (ref 135–145)
Total Bilirubin: 1.2 mg/dL (ref 0.3–1.2)
Total Protein: 6.5 g/dL (ref 6.5–8.1)

## 2021-01-14 LAB — CBC WITH DIFFERENTIAL/PLATELET
Abs Immature Granulocytes: 0.19 10*3/uL — ABNORMAL HIGH (ref 0.00–0.07)
Basophils Absolute: 0 10*3/uL (ref 0.0–0.1)
Basophils Relative: 0 %
Eosinophils Absolute: 0 10*3/uL (ref 0.0–0.5)
Eosinophils Relative: 0 %
HCT: 24.3 % — ABNORMAL LOW (ref 39.0–52.0)
Hemoglobin: 7.9 g/dL — ABNORMAL LOW (ref 13.0–17.0)
Immature Granulocytes: 2 %
Lymphocytes Relative: 51 %
Lymphs Abs: 4.2 10*3/uL — ABNORMAL HIGH (ref 0.7–4.0)
MCH: 38.2 pg — ABNORMAL HIGH (ref 26.0–34.0)
MCHC: 32.5 g/dL (ref 30.0–36.0)
MCV: 117.4 fL — ABNORMAL HIGH (ref 80.0–100.0)
Monocytes Absolute: 1.1 10*3/uL — ABNORMAL HIGH (ref 0.1–1.0)
Monocytes Relative: 13 %
Neutro Abs: 2.9 10*3/uL (ref 1.7–7.7)
Neutrophils Relative %: 34 %
Platelets: 252 10*3/uL (ref 150–400)
RBC: 2.07 MIL/uL — ABNORMAL LOW (ref 4.22–5.81)
RDW: 25.3 % — ABNORMAL HIGH (ref 11.5–15.5)
WBC: 8.4 10*3/uL (ref 4.0–10.5)
nRBC: 0 % (ref 0.0–0.2)

## 2021-01-14 LAB — SAMPLE TO BLOOD BANK

## 2021-01-15 ENCOUNTER — Inpatient Hospital Stay: Payer: Medicare HMO

## 2021-01-20 ENCOUNTER — Telehealth: Payer: Self-pay | Admitting: Cardiovascular Disease

## 2021-01-20 NOTE — Telephone Encounter (Signed)
Follow Up:      Tommy Peterson is checking on the status of pt's clearance. Please fax to [phone].

## 2021-01-21 ENCOUNTER — Encounter (HOSPITAL_COMMUNITY): Payer: Self-pay

## 2021-01-21 ENCOUNTER — Emergency Department (HOSPITAL_COMMUNITY)
Admission: EM | Admit: 2021-01-21 | Discharge: 2021-01-21 | Disposition: A | Discharge status: Home / Self Care | Payer: No Typology Code available for payment source | Attending: Emergency Medicine | Admitting: Emergency Medicine

## 2021-01-21 ENCOUNTER — Emergency Department (HOSPITAL_COMMUNITY): Payer: No Typology Code available for payment source

## 2021-01-21 ENCOUNTER — Other Ambulatory Visit: Payer: Self-pay

## 2021-01-21 DIAGNOSIS — I10 Essential (primary) hypertension: Secondary | ICD-10-CM | POA: Insufficient documentation

## 2021-01-21 DIAGNOSIS — R531 Weakness: Secondary | ICD-10-CM | POA: Diagnosis present

## 2021-01-21 DIAGNOSIS — D649 Anemia, unspecified: Secondary | ICD-10-CM | POA: Insufficient documentation

## 2021-01-21 DIAGNOSIS — N3020 Other chronic cystitis without hematuria: Secondary | ICD-10-CM | POA: Diagnosis not present

## 2021-01-21 DIAGNOSIS — E1165 Type 2 diabetes mellitus with hyperglycemia: Secondary | ICD-10-CM | POA: Insufficient documentation

## 2021-01-21 DIAGNOSIS — R296 Repeated falls: Secondary | ICD-10-CM | POA: Diagnosis not present

## 2021-01-21 DIAGNOSIS — Z794 Long term (current) use of insulin: Secondary | ICD-10-CM | POA: Diagnosis not present

## 2021-01-21 DIAGNOSIS — Z85828 Personal history of other malignant neoplasm of skin: Secondary | ICD-10-CM | POA: Diagnosis not present

## 2021-01-21 DIAGNOSIS — Z87891 Personal history of nicotine dependence: Secondary | ICD-10-CM | POA: Insufficient documentation

## 2021-01-21 DIAGNOSIS — E1140 Type 2 diabetes mellitus with diabetic neuropathy, unspecified: Secondary | ICD-10-CM | POA: Diagnosis not present

## 2021-01-21 DIAGNOSIS — Z7901 Long term (current) use of anticoagulants: Secondary | ICD-10-CM | POA: Diagnosis not present

## 2021-01-21 DIAGNOSIS — Z79899 Other long term (current) drug therapy: Secondary | ICD-10-CM | POA: Diagnosis not present

## 2021-01-21 DIAGNOSIS — R739 Hyperglycemia, unspecified: Secondary | ICD-10-CM

## 2021-01-21 DIAGNOSIS — Z8616 Personal history of COVID-19: Secondary | ICD-10-CM | POA: Insufficient documentation

## 2021-01-21 LAB — CBC
HCT: 22.2 % — ABNORMAL LOW (ref 39.0–52.0)
Hemoglobin: 7.5 g/dL — ABNORMAL LOW (ref 13.0–17.0)
MCH: 40.1 pg — ABNORMAL HIGH (ref 26.0–34.0)
MCHC: 33.8 g/dL (ref 30.0–36.0)
MCV: 118.7 fL — ABNORMAL HIGH (ref 80.0–100.0)
Platelets: 191 10*3/uL (ref 150–400)
RBC: 1.87 MIL/uL — ABNORMAL LOW (ref 4.22–5.81)
RDW: 24 % — ABNORMAL HIGH (ref 11.5–15.5)
WBC: 11.7 10*3/uL — ABNORMAL HIGH (ref 4.0–10.5)
nRBC: 0 % (ref 0.0–0.2)

## 2021-01-21 LAB — BASIC METABOLIC PANEL
Anion gap: 8 (ref 5–15)
BUN: 18 mg/dL (ref 8–23)
CO2: 27 mmol/L (ref 22–32)
Calcium: 9.1 mg/dL (ref 8.9–10.3)
Chloride: 94 mmol/L — ABNORMAL LOW (ref 98–111)
Creatinine, Ser: 0.68 mg/dL (ref 0.61–1.24)
GFR, Estimated: >60 mL/min (ref >60)
Glucose, Bld: 557 mg/dL (ref 70–99)
Potassium: 4.8 mmol/L (ref 3.5–5.1)
Sodium: 129 mmol/L — ABNORMAL LOW (ref 135–145)

## 2021-01-21 LAB — CBG MONITORING, ED: Glucose-Capillary: 441 mg/dL — ABNORMAL HIGH (ref 70–99)

## 2021-01-21 IMAGING — CT CT CERVICAL SPINE W/O CM
3 of 4 series · 13 of 33 positions shown, 16 images · non-contrast
Comparison: CT head without contrast [DATE]. CT of the cervical
spine [DATE]

CLINICAL DATA: Generalized weakness for 2 weeks. Fall 1 week ago.
Coagulopathy.

EXAM:
CT HEAD WITHOUT CONTRAST
CT CERVICAL SPINE WITHOUT CONTRAST
TECHNIQUE: Multidetector CT imaging of the head and cervical spine was
performed following the standard protocol without intravenous
contrast. Multiplanar CT image reconstructions of the cervical spine
were also generated.

[Series 5: orthogonal bone · axial · 0.24mm/px · z∈[-233,-101]mm · 5 of 109 slices shown, 7 images]
[im 19/109  soft-tissue]
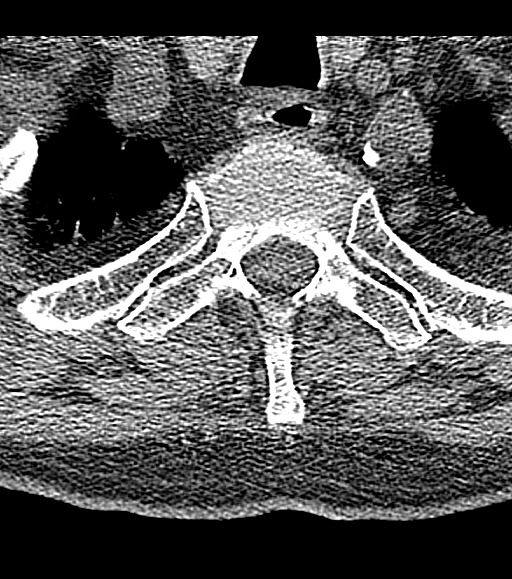
[im 19/109  bone]
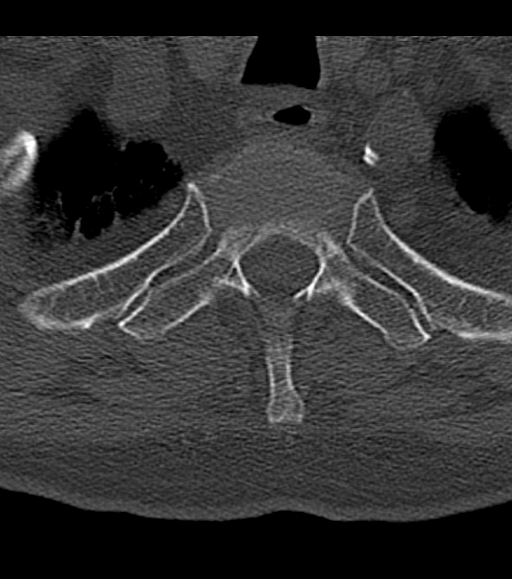
[im 37/109  bone]
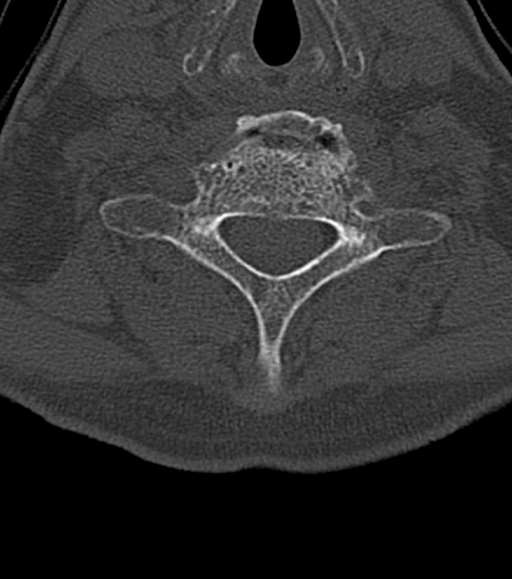
[im 55/109  bone]
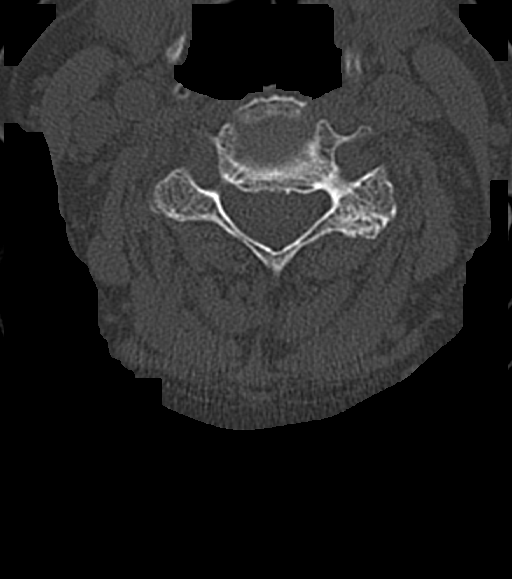
[im 73/109  bone]
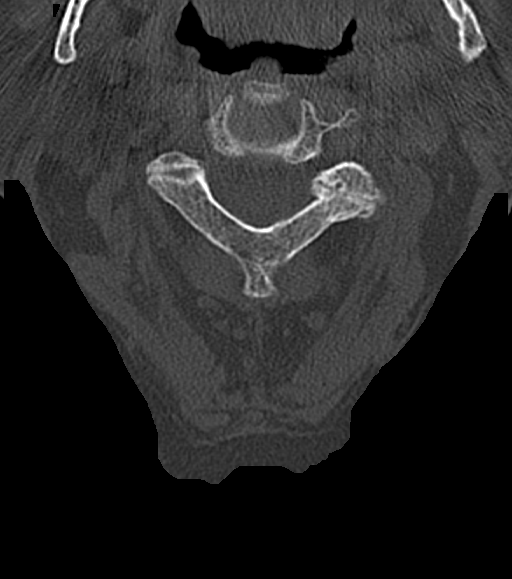
[im 91/109  soft-tissue]
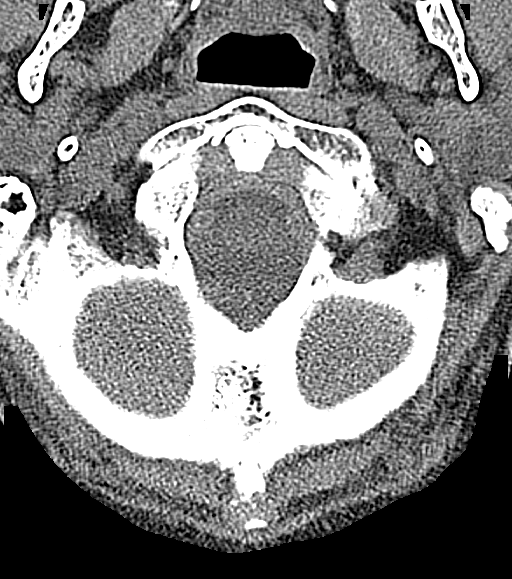
[im 91/109  bone]
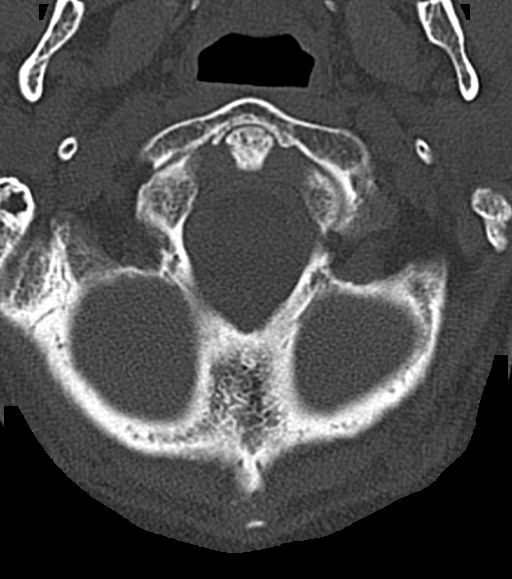

[Series 6: coronal bone · coronal · 0.31mm/px · 3 of 66 slices shown]
[im 14/66  bone]
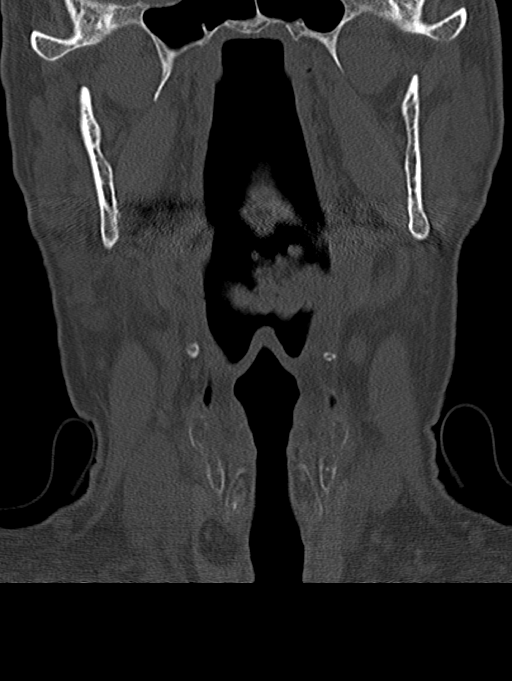
[im 27/66  bone]
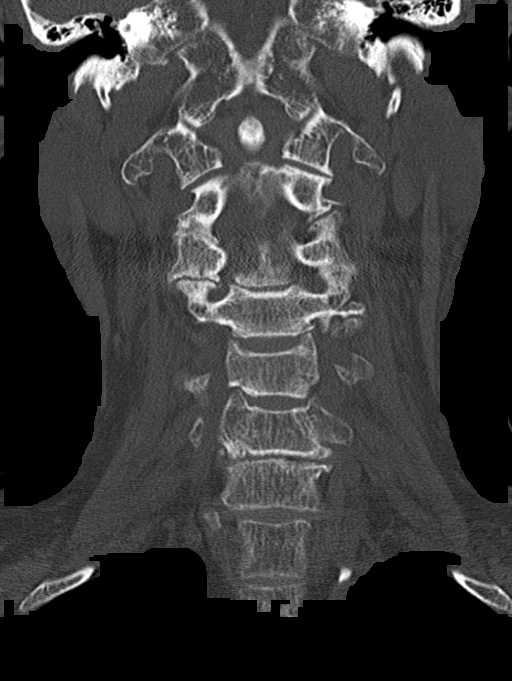
[im 39/66  bone]
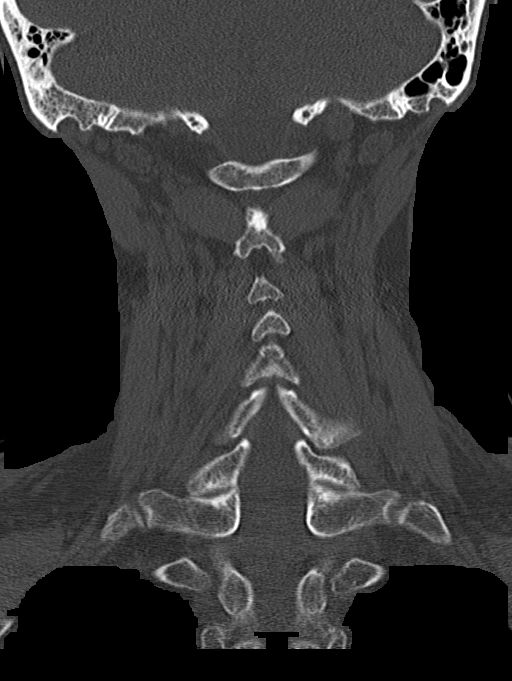

[Series 7: sagittal bone · sagittal · 0.38mm/px · 5 of 61 slices shown, 6 images]
[im 21/61  bone]
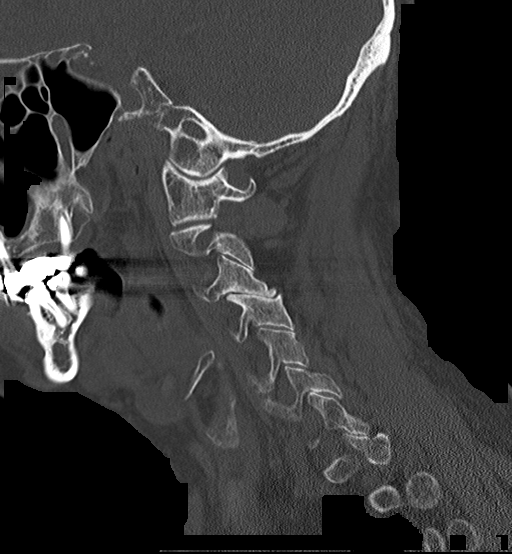
[im 26/61  bone]
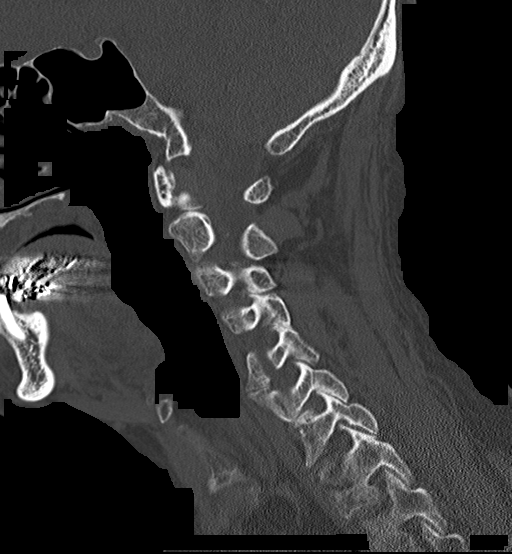
[im 31/61  soft-tissue]
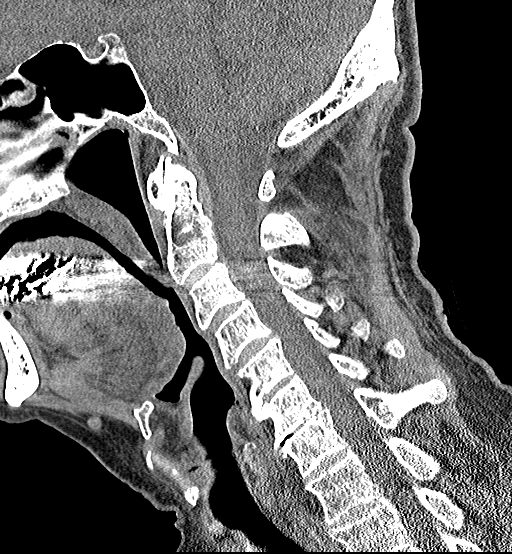
[im 31/61  bone]
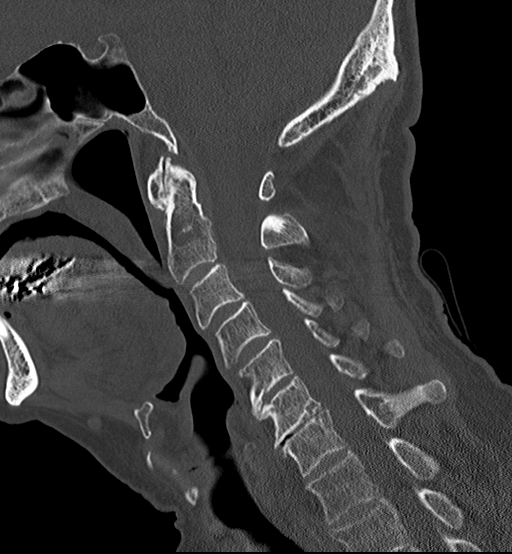
[im 36/61  bone]
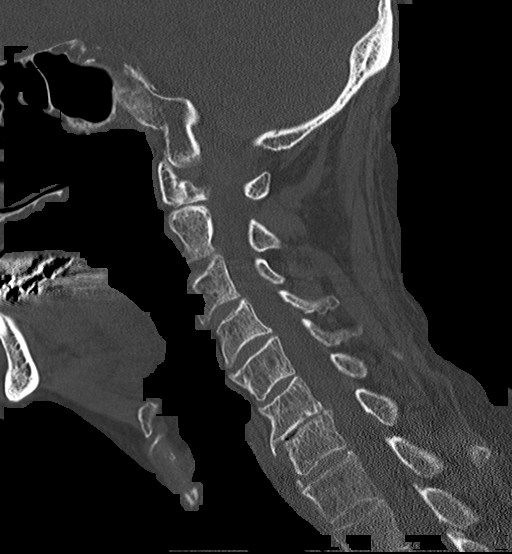
[im 41/61  bone]
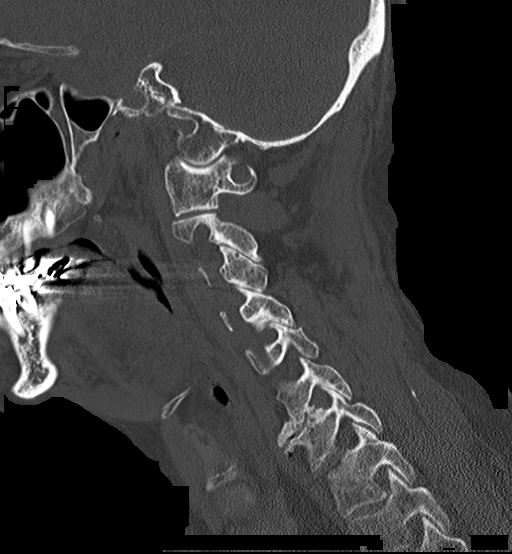

[13 of 33 positions shown; findings below may reference images not displayed]

FINDINGS: CT HEAD FINDINGS

Brain: Mild atrophy and white matter changes are stable. No acute
infarct, hemorrhage, or mass lesion is present. The ventricles are
proportionate to the degree of atrophy. No significant extraaxial
fluid collection is present.

Vascular: Atherosclerotic calcifications are present within the
cavernous internal carotid arteries and at the dural margin of both
vertebral arteries. No hyperdense vessel is present.

Skull: Calvarium is intact. No focal lytic or blastic lesions are
present. No significant extracranial soft tissue lesion is present.

Sinuses/Orbits: The paranasal sinuses and mastoid air cells are
clear. Bilateral lens replacements are noted. Globes and orbits are
otherwise unremarkable.

CT CERVICAL SPINE FINDINGS

Alignment: No significant listhesis is present. Straightening of the
normal cervical lordosis is noted.

Skull base and vertebrae: Craniocervical junction is within limits.
Vertebral body heights are normal. No acute or healing fractures are
present.

Soft tissues and spinal canal: No prevertebral fluid or swelling. No
visible canal hematoma. Atherosclerotic calcifications are present
at the carotid bifurcations bilaterally. Fat containing nodule in
the right lobe of the thyroid is stable measuring up to 2 cm.

Disc levels: C2-3: Asymmetric left-sided facet hypertrophy results
in mild left foraminal narrowing.

C3-4: Facet hypertrophy is worse on the right. Moderate right
foraminal narrowing is present.

C4-5: Asymmetric left-sided uncovertebral and facet hypertrophy
results in moderate left foraminal stenosis.

C5-6: Uncovertebral spurring is present without significant
stenosis.

C6-7: Uncovertebral and facet hypertrophy contribute to mild
foraminal narrowing bilaterally.

C7-T1: Negative.

Upper chest: Lung apices are clear. Thoracic inlet otherwise within
normal limits.
IMPRESSION: 1. No acute trauma to the head or cervical spine.
2. Stable atrophy and white matter disease. This likely reflects the
sequela of chronic microvascular ischemia.
3. Multilevel degenerative changes of the cervical spine as
described.
4. No acute or healing fractures.

## 2021-01-21 IMAGING — CR DG CHEST 2V
2 series · 2 of 2 positions shown · non-contrast
Comparison: [DATE]

CLINICAL DATA: Generalized weakness

EXAM:
CHEST - 2 VIEW

[w chest lat]
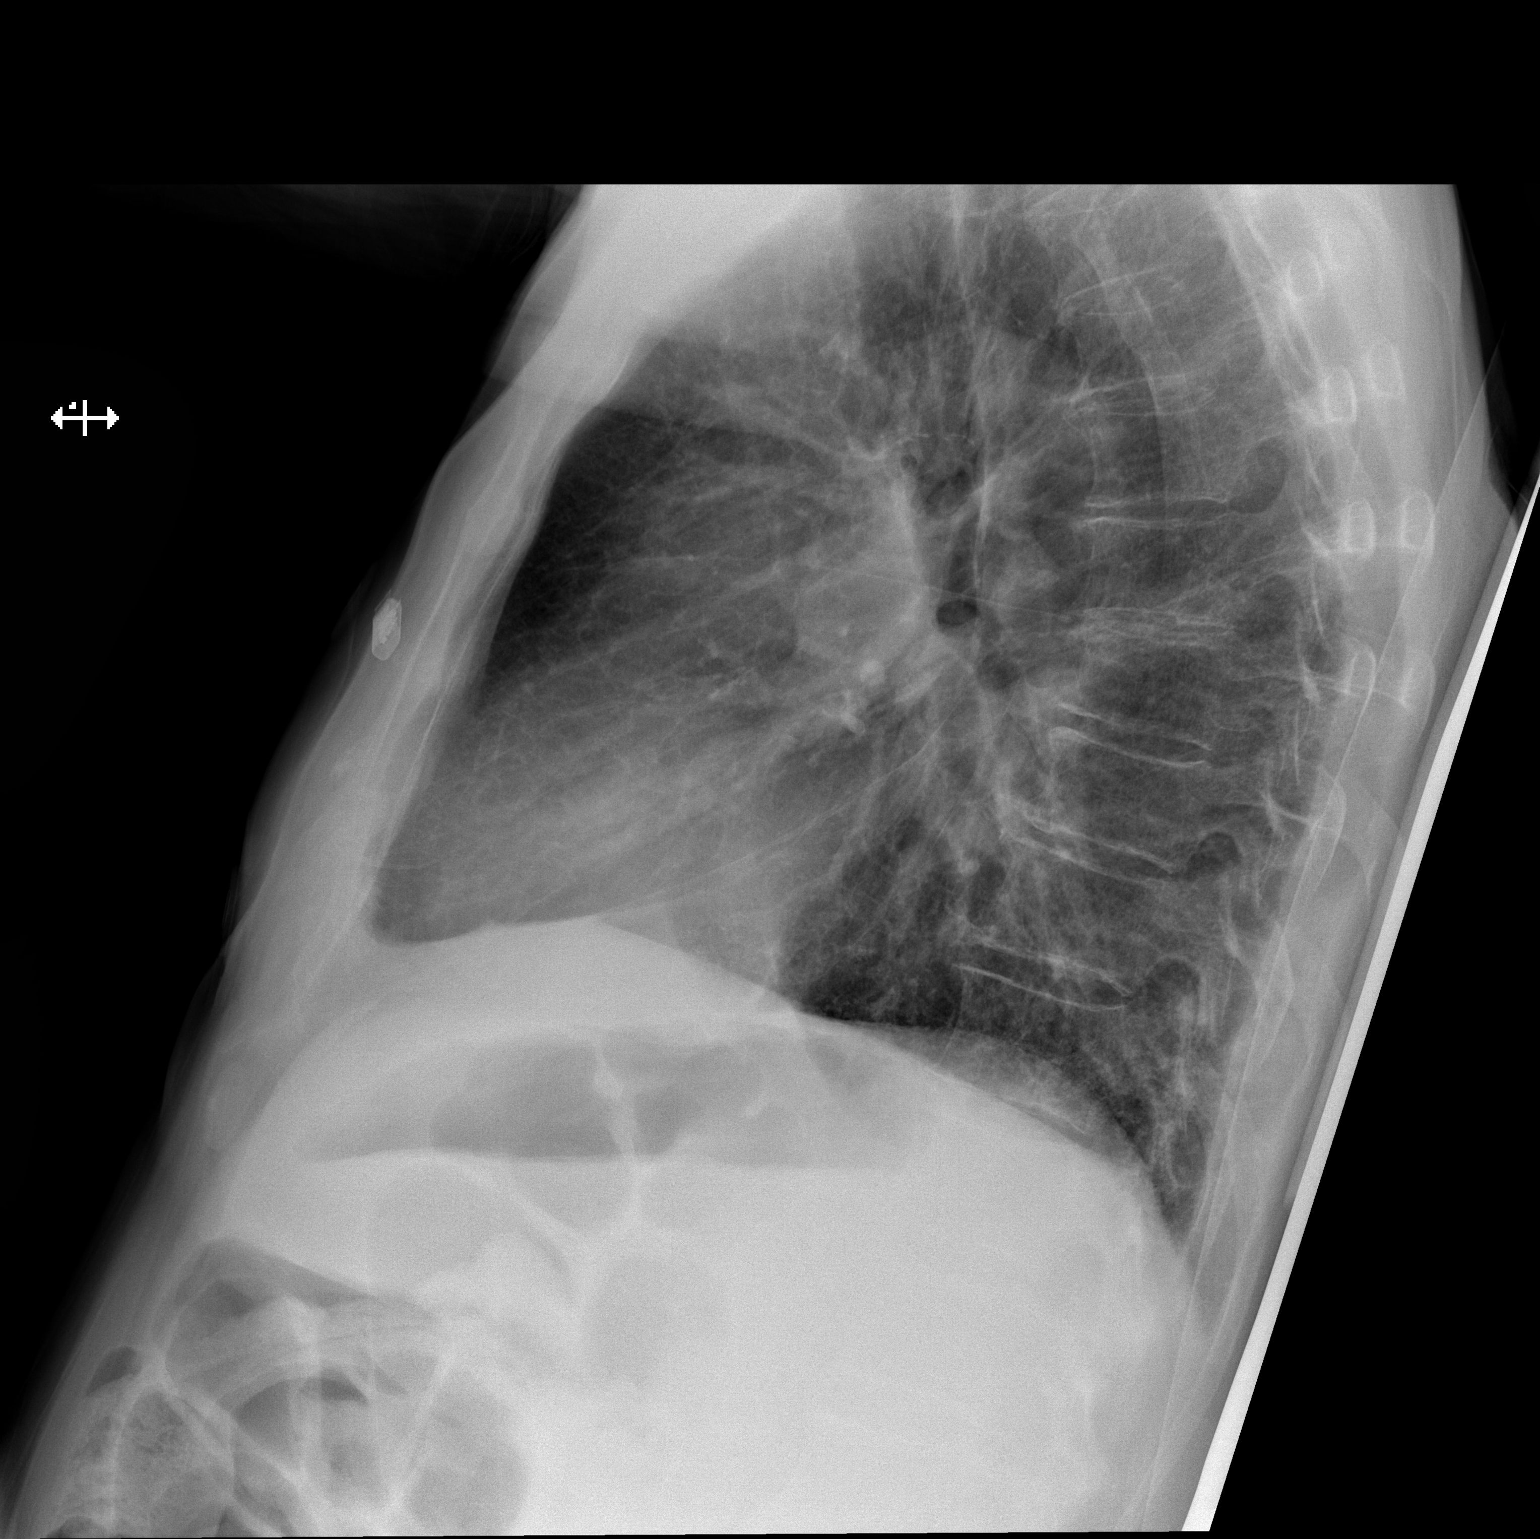

[x chest ap]
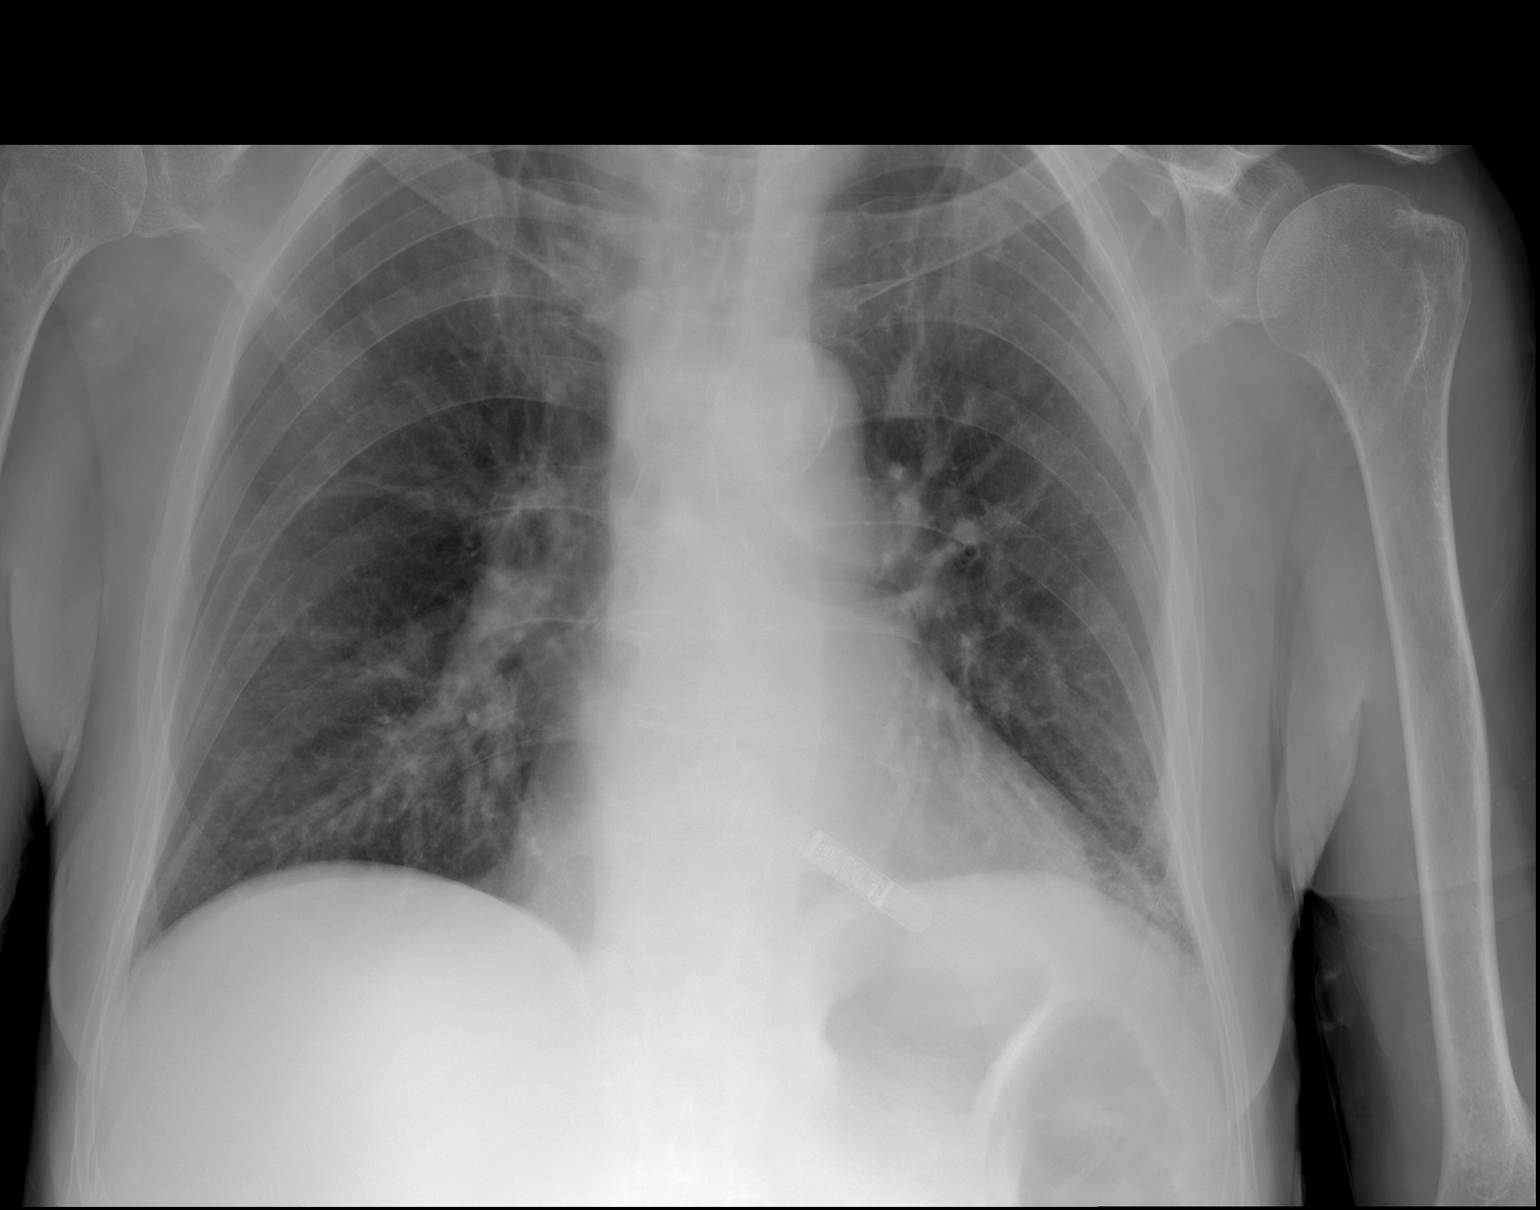

[2 of 2 positions shown; findings below may reference images not displayed]

FINDINGS: Stable cardiomediastinal contours. Loop recorder device noted.
Atherosclerotic calcification of the aortic knob. Mild left basilar
streaky opacity. Right lung appears clear. No pleural effusion or
pneumothorax.
IMPRESSION: Mild left basilar streaky opacity, atelectasis versus infiltrate.

## 2021-01-21 IMAGING — CT CT HEAD W/O CM
3 series · 14 of 47 positions shown, 16 images · non-contrast
Comparison: CT head without contrast [DATE]. CT of the cervical
spine [DATE]

CLINICAL DATA: Generalized weakness for 2 weeks. Fall 1 week ago.
Coagulopathy.

EXAM:
CT HEAD WITHOUT CONTRAST
CT CERVICAL SPINE WITHOUT CONTRAST
TECHNIQUE: Multidetector CT imaging of the head and cervical spine was
performed following the standard protocol without intravenous
contrast. Multiplanar CT image reconstructions of the cervical spine
were also generated.

[Series 3: head wo · axial · 0.47mm/px · z∈[-54,+76]mm · 8 of 32 slices shown, 10 images]
[im 3/32  brain]
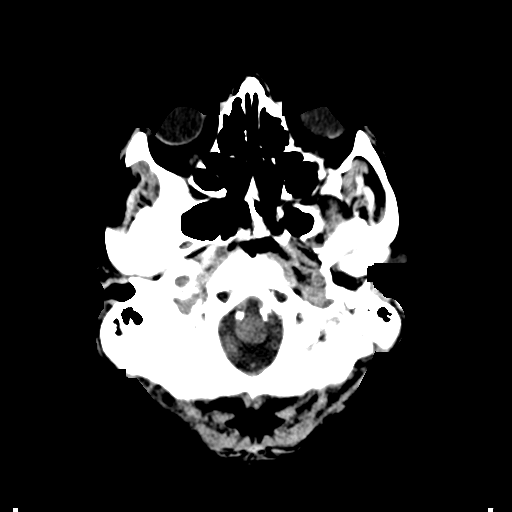
[im 3/32  bone]
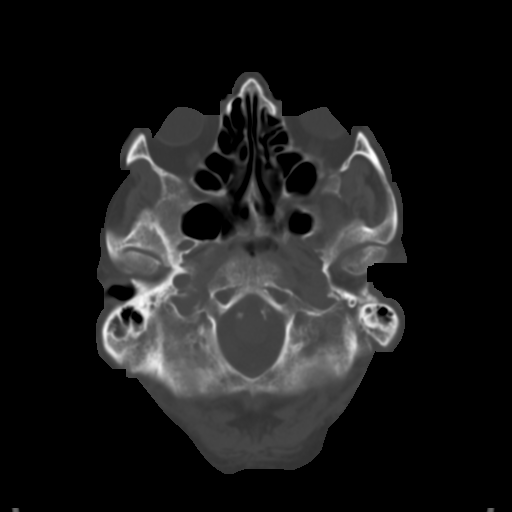
[im 7/32  brain]
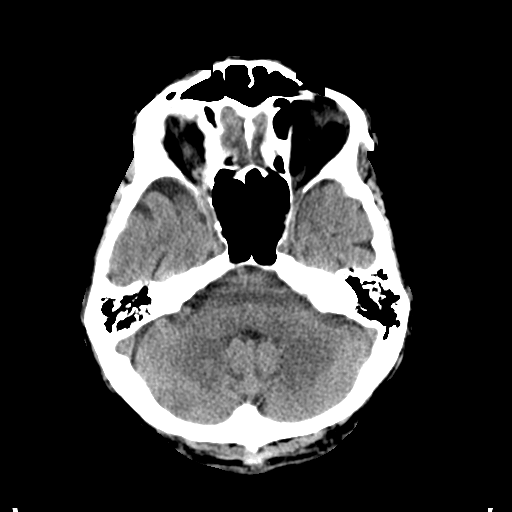
[im 10/32  brain]
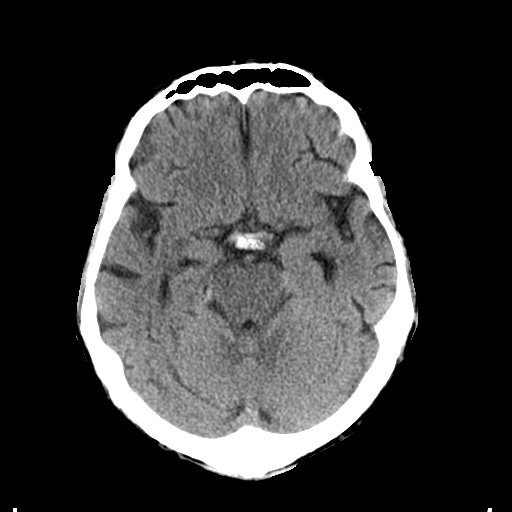
[im 14/32  brain]
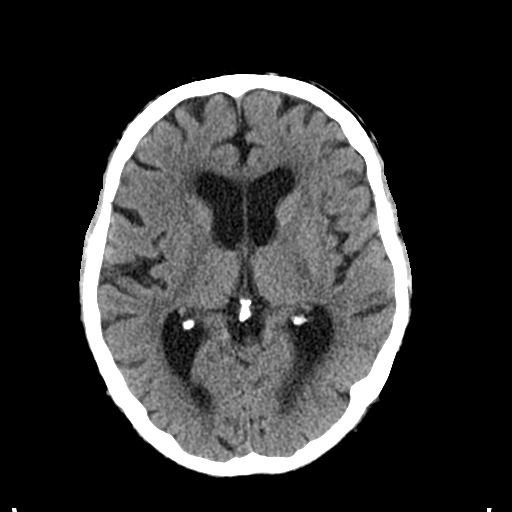
[im 18/32  brain]
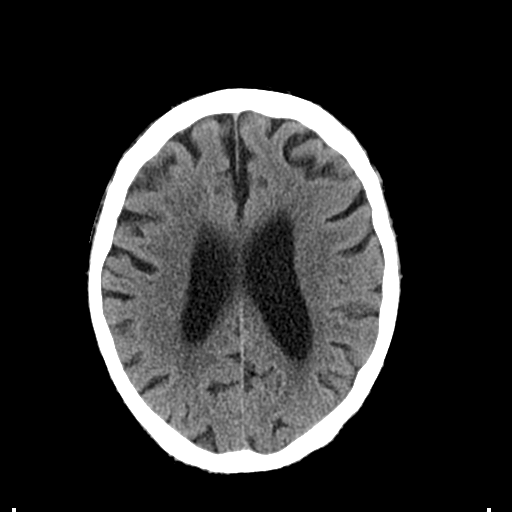
[im 18/32  bone]
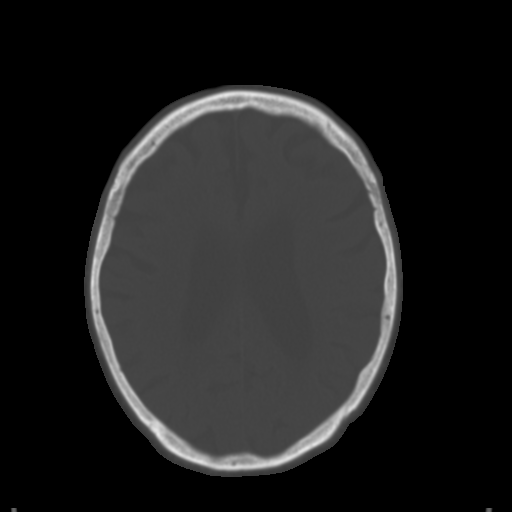
[im 22/32  brain]
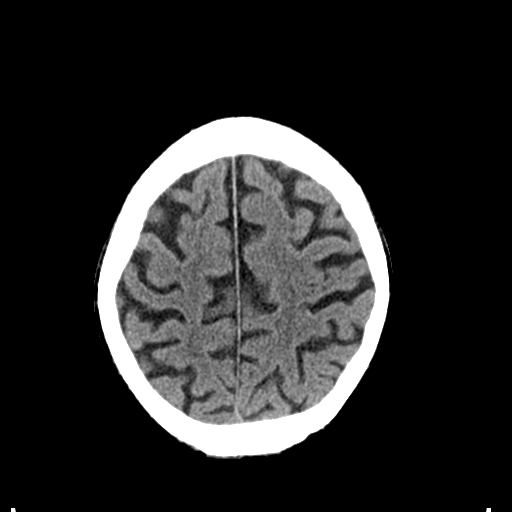
[im 25/32  brain]
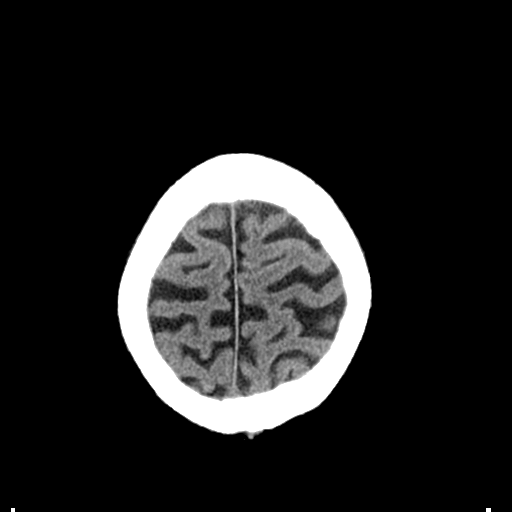
[im 29/32  brain]
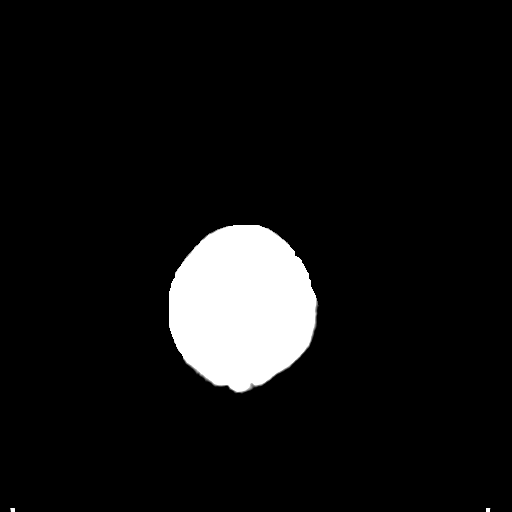

[Series 5: coronal soft tissue · coronal · 0.30mm/px · 3 of 71 slices shown]
[im 24/71  brain]
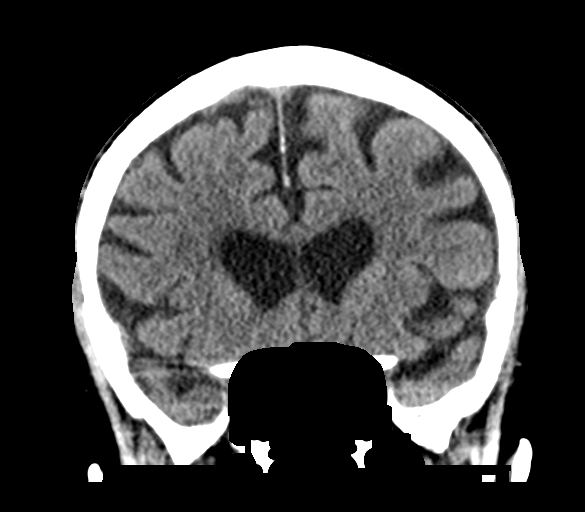
[im 32/71  brain]
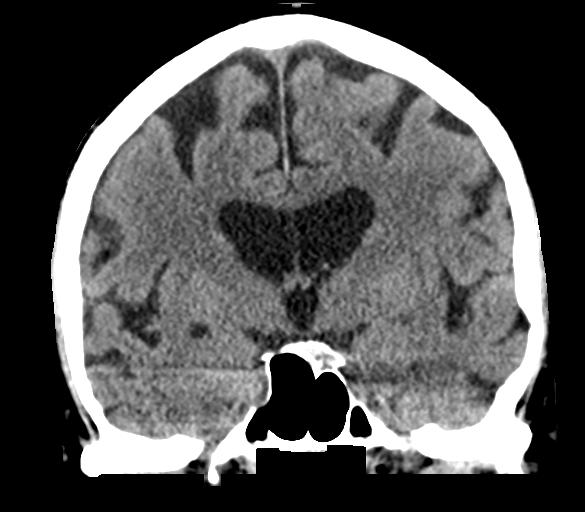
[im 39/71  brain]
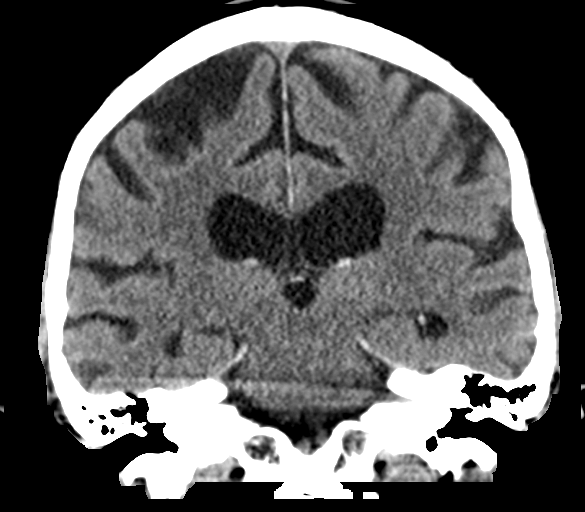

[Series 6: sagittal soft tissue · sagittal · 0.31mm/px · 3 of 63 slices shown]
[im 21/63  brain]
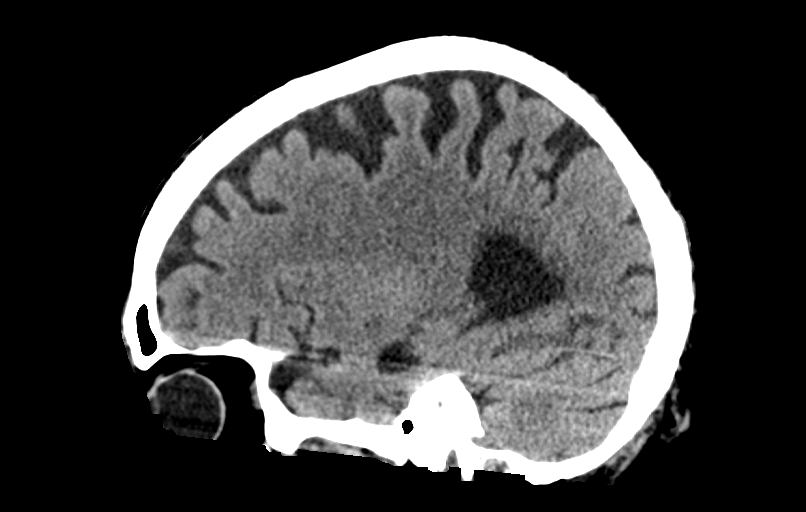
[im 32/63  brain]
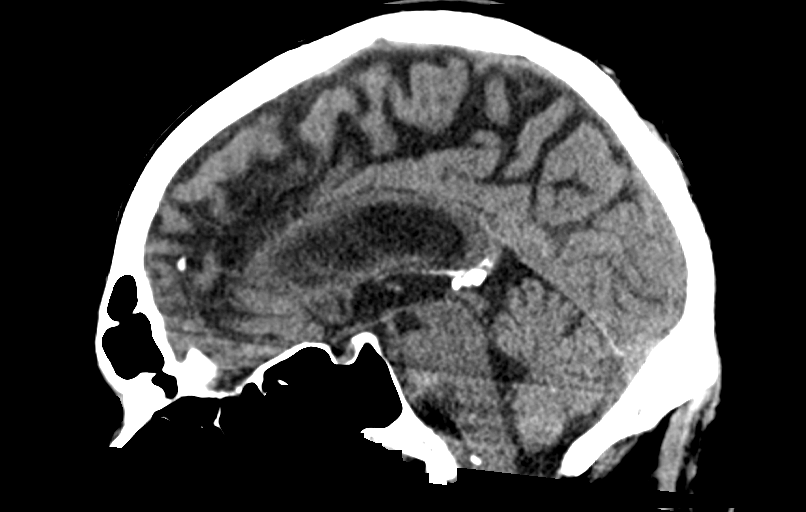
[im 42/63  brain]
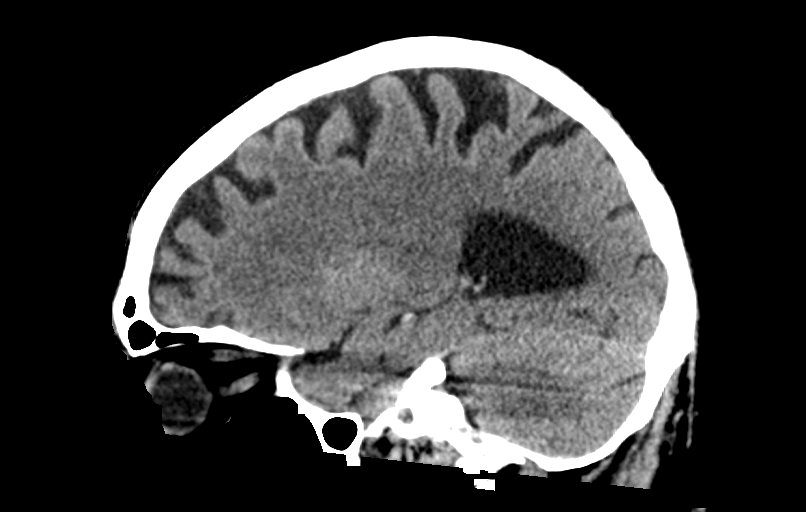

[14 of 47 positions shown; findings below may reference images not displayed]

FINDINGS: CT HEAD FINDINGS

Brain: Mild atrophy and white matter changes are stable. No acute
infarct, hemorrhage, or mass lesion is present. The ventricles are
proportionate to the degree of atrophy. No significant extraaxial
fluid collection is present.

Vascular: Atherosclerotic calcifications are present within the
cavernous internal carotid arteries and at the dural margin of both
vertebral arteries. No hyperdense vessel is present.

Skull: Calvarium is intact. No focal lytic or blastic lesions are
present. No significant extracranial soft tissue lesion is present.

Sinuses/Orbits: The paranasal sinuses and mastoid air cells are
clear. Bilateral lens replacements are noted. Globes and orbits are
otherwise unremarkable.

CT CERVICAL SPINE FINDINGS

Alignment: No significant listhesis is present. Straightening of the
normal cervical lordosis is noted.

Skull base and vertebrae: Craniocervical junction is within limits.
Vertebral body heights are normal. No acute or healing fractures are
present.

Soft tissues and spinal canal: No prevertebral fluid or swelling. No
visible canal hematoma. Atherosclerotic calcifications are present
at the carotid bifurcations bilaterally. Fat containing nodule in
the right lobe of the thyroid is stable measuring up to 2 cm.

Disc levels: C2-3: Asymmetric left-sided facet hypertrophy results
in mild left foraminal narrowing.

C3-4: Facet hypertrophy is worse on the right. Moderate right
foraminal narrowing is present.

C4-5: Asymmetric left-sided uncovertebral and facet hypertrophy
results in moderate left foraminal stenosis.

C5-6: Uncovertebral spurring is present without significant
stenosis.

C6-7: Uncovertebral and facet hypertrophy contribute to mild
foraminal narrowing bilaterally.

C7-T1: Negative.

Upper chest: Lung apices are clear. Thoracic inlet otherwise within
normal limits.
IMPRESSION: 1. No acute trauma to the head or cervical spine.
2. Stable atrophy and white matter disease. This likely reflects the
sequela of chronic microvascular ischemia.
3. Multilevel degenerative changes of the cervical spine as
described.
4. No acute or healing fractures.

## 2021-01-21 MED ORDER — INSULIN ASPART 100 UNIT/ML IJ SOLN
12.0000 [IU] | Freq: Once | INTRAMUSCULAR | Status: AC
  Administered 2021-01-21: 12 [IU] via SUBCUTANEOUS
  Filled 2021-01-21: qty 0.12

## 2021-01-21 MED ORDER — SODIUM CHLORIDE 0.9 % IV BOLUS
1000.0000 mL | Freq: Once | INTRAVENOUS | Status: AC
  Administered 2021-01-21: 1000 mL via INTRAVENOUS

## 2021-01-21 NOTE — ED Provider Notes (Signed)
[facility] DEPT Provider Note   CSN: [id number] Arrival date & time: 01/21/21  1535     History Chief Complaint  Patient presents with   Weakness    Tommy Peterson is a 80 y.o. male.  HPI He presents generalized weakness.  He had a fall 1 week ago because he felt dizzy.  He had been shopping, up for a while, and fell in the parking lot while going back to his car.  He states that typically when he is up more than 20 minutes he gets weak and starts to drift to the right while walking.  He does not feel like he injured himself from the fall.  He feels generally weak.  He has been eating well.  He denies vomiting.  He denies cough, or shortness of breath.  He is worried about recurrent episodes of sepsis requiring hospitalization.  He states he is taking all of his medications as prescribed.  There are no other known active modifying factors.    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--  treatment weekly methotraxate and transfusion's (last PRBCs 07-19-2017)   Collagenous colitis    chronic--- intermittant between diarrahea and constipation   ED (erectile dysfunction)    HCAP (healthcare-associated pneumonia)    History of adenomatous polyp of colon    History of Clostridium difficile colitis 05/2016   History of pneumonia 11/08/2017   CAP -- LLL---  12-19-2017  per pt no residual symptoms   History of SCC  (squamous cell carcinoma) of skin    History of sepsis    11-20-2017 severe sepsis due to UTI;  10/ 2017sepsis due to c-diff colitis   Leucocytosis    chronic   Lower urinary tract symptoms (LUTS)    Macrocytic anemia    since 02/ 2015   MGUS (monoclonal gammopathy of unknown significance)    hemotologist-  [name] @ [facility]   Monoclonal paraproteinemia    Neuropathy, peripheral    Nodular basal cell carcinoma (BCC) 10/01/2018   Left Neck(Nodular) Pt Does Not Want Treatment   Nodular basal cell carcinoma (BCC) 10/01/2018   Mid Forehead (treament curet and excision)   OA (osteoarthritis)    Prostatic stone    Rash of face    RIGHT SIDE   Raynaud's syndrome    Recurrent BCC (basal cell carcinoma) 03/05/2019   positive margin   SCC (squamous cell carcinoma) 08/10/2009   Central Forehead (Moh's Dr. Rhona Raider @ [facility])   SCC (squamous cell carcinoma) 12/08/2011   CIS-Left Cheek (treatment Aldara @ [facility])   SCC (squamous cell carcinoma) 07/09/2015   CIS-Left Forehead (treament @ [facility])   Septic shock ([place])    x 3 w/UTI   Squamous cell carcinoma in situ (SCCIS) 12/08/2011   Left Cheek (treatment Aldara @ [facility])    Superficial basal cell carcinoma ([place]) 10/01/2018   Left Shin-Pt does not want treatment  Thin skin    fragile   Transfusion-dependent anemia since 10/ 2017   last transfusion PRBCs 07-19-2017  per hemologist note dated 10-25-2017   Type 2 diabetes mellitus treated with insulin ([place])    followed by [name] (pcp)   Urinary hesitancy    Wears contact lenses    LEFT EYE ONLY    Patient Active Problem List   Diagnosis Date Noted   Severe sepsis ([place]) 12/31/2020   Epididymitis 12/30/2020   Dehydration 12/30/2020   Hyponatremia 12/30/2020   Head injury 12/30/2020   Sepsis secondary to UTI ([place]) 12/09/2020   Frequent falls 12/09/2020   Acute on chronic heart failure with preserved ejection fraction (HFpEF) ([place]) 11/07/2020   Volume overload  11/06/2020   Chronic anticoagulation 10/11/2020   Persistent atrial fibrillation ([place])    Acute hypoxemic respiratory failure due to COVID-19 ([place]) 04/07/2020   Pressure injury of skin 09/02/2018   UTI (urinary tract infection) 09/01/2018   Sepsis ([place]) 07/28/2018   CAP (community acquired pneumonia) 07/28/2018   Paroxysmal atrial fibrillation ([place]) 04/10/2018   Aortic atherosclerosis ([place]) 04/08/2018   DM type 2 (diabetes mellitus, type 2) ([place]) 02/19/2018   Acute respiratory failure with hypoxia ([place]) 02/18/2018   MGUS (monoclonal gammopathy of unknown significance) 11/23/2017   BPH (benign prostatic hyperplasia) 11/23/2017   Left lower lobe pneumonia 11/20/2017   Nausea vomiting and diarrhea 11/20/2017   Leukemia not having achieved remission ([place]) 12/26/2016   Anemia 12/26/2016   Large granular lymphocytic leukemia ([place]) 12/26/2016   Diabetes mellitus with complication (HCC)    Macrocytic anemia    DOE (dyspnea on exertion)    Anxiety    History of GI bleed    C. difficile colitis 05/31/2016   Lactic acidosis    History of bone marrow biopsy 05/27/2016   Fever 01/77/9390   Acute metabolic encephalopathy 30/04/2329   Chest pain 05/04/2016   Benign fibroma of prostate 09/20/2015   HLD (hyperlipidemia) 09/20/2015   Essential hypertension 09/20/2015   Arthritis, degenerative 09/20/2015   Colitis, collagenous 09/09/2015   Absolute anemia 09/08/2014   Adenoma of large intestine 07/13/2014   Disturbance of skin sensation 01/09/2014   Recurrent major depressive disorder, in full remission ([place]) 07/18/2012   Allergic rhinitis 08/29/2011    Past Surgical History:  Procedure Laterality Date   BONE MARROW BIOPSY Right 05-24-2016;  08-20-2014   CARDIOVASCULAR STRESS TEST  11-26-2012   [name]  @ [place] ([place]   normal nuclear study w/ no ischemia/  normal LV function and wall motion , ef 60% (find in care everywhere,epic)   CARDIOVERSION N/A 08/31/2020    Procedure: CARDIOVERSION;  Surgeon: [name], MD;  Location: [facility];  Service: Cardiovascular;  Laterality: N/A;   CATARACT EXTRACTION W/ INTRAOCULAR LENS  IMPLANT, BILATERAL  2015   CYSTOSCOPY WITH LITHOLAPAXY N/A 12/25/2017   Procedure: CYSTOSCOPY WITH LITHOLAPAXY AND FULGERATION;  Surgeon: [name], MD;  Location: [facility];  Service: Urology;  Laterality: N/A;   INGUINAL HERNIA REPAIR Right 1980s   LOOP RECORDER INSERTION N/A 04/10/2018   Procedure: LOOP RECORDER INSERTION;  Surgeon: [name], MD;  Location: [facility];  Service: Cardiovascular;  Laterality: N/A;   PENILE PROSTHESIS IMPLANT  12-02-2015   dr [name] @ [place] in [place] Left 08/2016   TEE WITHOUT CARDIOVERSION N/A 08/31/2020   Procedure: TRANSESOPHAGEAL ECHOCARDIOGRAM (TEE);  Surgeon: [name], MD;  Location: [facility];  Service: Cardiovascular;  Laterality: N/A;   TONSILLECTOMY  child   TRANSURETHRAL RESECTION OF PROSTATE  2009   AND REPAIR RECURRENT RIGHT INGUINAL HERNIA       Family History  Problem Relation Age of Onset   Diabetes Mother    Heart failure Mother     Social History   Tobacco Use   Smoking status: Former    Years: 20.00    Pack years: 0.00    Types: Cigarettes    Quit date: 05/04/1996    Years since quitting: 24.7   Smokeless tobacco: Never  Vaping Use   Vaping Use: Never used  Substance Use Topics   Alcohol use: Yes    Comment: 1-2 glasses of wine every other day   Drug use: No    Home Medications Prior to Admission medications   Medication Sig Start Date End Date Taking? Authorizing Provider  amiodarone (PACERONE) 200 MG tablet Take 1 tablet (200 mg total) by mouth daily. 09/29/20  Yes [facility], Tami Lin, PA  apixaban (ELIQUIS) 5 MG TABS tablet Take 1 tablet (5 mg total) by mouth 2 (two) times daily. 08/18/20  Yes Croitoru, Mihai, MD  B Complex Vitamins (B COMPLEX 100 PO) Take 1 tablet by  mouth daily.   Yes [provider]  budesonide (ENTOCORT EC) 3 MG 24 hr capsule Take 9 mg by mouth daily.   Yes [provider]  Cranberry 500 MG TABS Take 500 mg by mouth 2 (two) times daily.   Yes [provider]  Cyanocobalamin (VITAMIN B-12) 5000 MCG SUBL Place 5,000 mcg under the tongue daily.   Yes [provider]  D-Mannose 500 MG CAPS Take 500 mg by mouth in the morning and at bedtime.   Yes [provider]  doxycycline (VIBRAMYCIN) 100 MG capsule Take 1 capsule (100 mg total) by mouth at bedtime. 01/02/21  Yes [name], MD  folic acid (FOLVITE) 1 MG tablet TAKE 1 TABLET BY MOUTH EVERY DAY Patient taking differently: Take 1 mg by mouth daily. 07/23/20  Yes Brunetta Genera, MD  furosemide (LASIX) 40 MG tablet Take 20 mg by mouth daily.   Yes [provider]  Insulin Glargine-Lixisenatide 100-33 UNT-MCG/ML SOPN Inject 26 Units into the skin at bedtime. [place] 100/33   Yes [provider]  insulin lispro (HUMALOG) 100 UNIT/ML KwikPen Inject 5 Units into the skin 3 (three) times daily.   Yes [provider]  metoprolol tartrate (LOPRESSOR) 25 MG tablet Take 1 tablet (25 mg total) by mouth 2 (two) times daily. 01/03/21  Yes Shawna Clamp, MD  omeprazole (PRILOSEC) 40 MG capsule Take 40 mg by mouth daily.   Yes [provider]  pioglitazone (ACTOS) 15 MG tablet Take 15 mg by mouth every morning. 10/18/17  Yes [provider]  Vibegron (GEMTESA) 75 MG TABS Take 75 mg by mouth daily.   Yes [provider]  vitamin B-12 (CYANOCOBALAMIN) 500 MCG tablet Take 500 mcg by mouth daily.   Yes [provider]  BD PEN NEEDLE NANO 2ND GEN 32G X 4 MM MISC  07/22/20   [provider]  cephALEXin (KEFLEX) 500 MG capsule Take 500 mg by mouth 4 (four) times daily. Patient not taking: No sig reported    [provider]  tamsulosin (FLOMAX) 0.4 MG CAPS capsule Take 1 capsule (0.4 mg  total) by mouth daily after breakfast. Patient not taking: No sig reported 12/13/20   Charlynne Cousins, MD    Allergies  Bee venom, Metformin and related, Sulfamethoxazole-trimethoprim, Bee pollen, and Metformin hcl  Review of Systems   Review of Systems  All other systems reviewed and are negative.  Physical Exam Updated Vital Signs BP (!) 130/96   Pulse 75   Temp 98.7 F (37.1 C) (Oral)   Resp 16   SpO2 96%   Physical Exam Vitals and nursing note reviewed.  Constitutional:      General: He is not in acute distress.    Appearance: He is well-developed. He is not ill-appearing, toxic-appearing or diaphoretic.  HENT:     Head: Normocephalic and atraumatic.     Right Ear: External ear normal.     Left Ear: External ear normal.  Eyes:     Conjunctiva/sclera: Conjunctivae normal.     Pupils: Pupils are equal, round, and reactive to light.  Neck:     Trachea: Phonation normal.  Cardiovascular:     Rate and Rhythm: Normal rate and regular rhythm.     Heart sounds: Normal heart sounds.  Pulmonary:     Effort: Pulmonary effort is normal.     Breath sounds: Normal breath sounds.  Abdominal:     Palpations: Abdomen is soft.     Tenderness: There is no abdominal tenderness.  Musculoskeletal:        General: Normal range of motion.     Cervical back: Normal range of motion and neck supple.     Right lower leg: No edema.     Left lower leg: No edema.  Skin:    General: Skin is warm and dry.     Coloration: Skin is not jaundiced or pale.  Neurological:     Mental Status: He is alert and oriented to person, place, and time.     Cranial Nerves: No cranial nerve deficit.     Sensory: No sensory deficit.     Motor: No abnormal muscle tone.     Coordination: Coordination normal.  Psychiatric:        Mood and Affect: Mood normal.        Behavior: Behavior normal.        Thought Content: Thought content normal.        Judgment: Judgment normal.    ED Results /  Procedures / Treatments   Labs (all labs ordered are listed, but only abnormal results are displayed) Labs Reviewed  BASIC METABOLIC PANEL - Abnormal; Notable for the following components:      Result Value   Sodium 129 (*)    Chloride 94 (*)    Glucose, Bld 557 (*)    All other components within normal limits  CBC - Abnormal; Notable for the following components:   WBC 11.7 (*)    RBC 1.87 (*)    Hemoglobin 7.5 (*)    HCT 22.2 (*)    MCV 118.7 (*)    MCH 40.1 (*)    RDW 24.0 (*)    All other components within normal limits  CBG MONITORING, ED - Abnormal; Notable for the following components:   Glucose-Capillary 441 (*)    All other components within normal limits  URINALYSIS, ROUTINE W REFLEX MICROSCOPIC    EKG None  Radiology DG Chest 2 View  Result Date: 01/21/2021 CLINICAL DATA:  Generalized weakness EXAM: CHEST - 2 VIEW COMPARISON:  12/09/2020 FINDINGS: Stable cardiomediastinal contours. Loop recorder device noted. Atherosclerotic calcification of the aortic knob. Mild left basilar streaky opacity. Right lung appears clear. No pleural effusion or pneumothorax. IMPRESSION: Mild  left basilar streaky opacity, atelectasis versus infiltrate. Electronically Signed   By: Davina Poke D.O.   On: 01/21/2021 16:27   CT Head Wo Contrast  Result Date: 01/21/2021 CLINICAL DATA:  Generalized weakness for 2 weeks. Fall 1 week ago. Coagulopathy. EXAM: CT HEAD WITHOUT CONTRAST CT CERVICAL SPINE WITHOUT CONTRAST TECHNIQUE: Multidetector CT imaging of the head and cervical spine was performed following the standard protocol without intravenous contrast. Multiplanar CT image reconstructions of the cervical spine were also generated. COMPARISON:  CT head without contrast 12/30/2020. CT of the cervical spine 03/24/2019 FINDINGS: CT HEAD FINDINGS Brain: Mild atrophy and white matter changes are stable. No acute infarct, hemorrhage, or mass lesion is present. The ventricles are proportionate to  the degree of atrophy. No significant extraaxial fluid collection is present. Vascular: Atherosclerotic calcifications are present within the cavernous internal carotid arteries and at the dural margin of both vertebral arteries. No hyperdense vessel is present. Skull: Calvarium is intact. No focal lytic or blastic lesions are present. No significant extracranial soft tissue lesion is present. Sinuses/Orbits: The paranasal sinuses and mastoid air cells are clear. Bilateral lens replacements are noted. Globes and orbits are otherwise unremarkable. CT CERVICAL SPINE FINDINGS Alignment: No significant listhesis is present. Straightening of the normal cervical lordosis is noted. Skull base and vertebrae: Craniocervical junction is within limits. Vertebral body heights are normal. No acute or healing fractures are present. Soft tissues and spinal canal: No prevertebral fluid or swelling. No visible canal hematoma. Atherosclerotic calcifications are present at the carotid bifurcations bilaterally. Fat containing nodule in the right lobe of the thyroid is stable measuring up to 2 cm. Disc levels: C2-3: Asymmetric left-sided facet hypertrophy results in mild left foraminal narrowing. C3-4: Facet hypertrophy is worse on the right. Moderate right foraminal narrowing is present. C4-5: Asymmetric left-sided uncovertebral and facet hypertrophy results in moderate left foraminal stenosis. C5-6: Uncovertebral spurring is present without significant stenosis. C6-7: Uncovertebral and facet hypertrophy contribute to mild foraminal narrowing bilaterally. C7-T1: Negative. Upper chest: Lung apices are clear. Thoracic inlet otherwise within normal limits. IMPRESSION: 1. No acute trauma to the head or cervical spine. 2. Stable atrophy and white matter disease. This likely reflects the sequela of chronic microvascular ischemia. 3. Multilevel degenerative changes of the cervical spine as described. 4. No acute or healing fractures.  Electronically Signed   By: San Morelle M.D.   On: 01/21/2021 17:52   CT Cervical Spine Wo Contrast  Result Date: 01/21/2021 CLINICAL DATA:  Generalized weakness for 2 weeks. Fall 1 week ago. Coagulopathy. EXAM: CT HEAD WITHOUT CONTRAST CT CERVICAL SPINE WITHOUT CONTRAST TECHNIQUE: Multidetector CT imaging of the head and cervical spine was performed following the standard protocol without intravenous contrast. Multiplanar CT image reconstructions of the cervical spine were also generated. COMPARISON:  CT head without contrast 12/30/2020. CT of the cervical spine 03/24/2019 FINDINGS: CT HEAD FINDINGS Brain: Mild atrophy and white matter changes are stable. No acute infarct, hemorrhage, or mass lesion is present. The ventricles are proportionate to the degree of atrophy. No significant extraaxial fluid collection is present. Vascular: Atherosclerotic calcifications are present within the cavernous internal carotid arteries and at the dural margin of both vertebral arteries. No hyperdense vessel is present. Skull: Calvarium is intact. No focal lytic or blastic lesions are present. No significant extracranial soft tissue lesion is present. Sinuses/Orbits: The paranasal sinuses and mastoid air cells are clear. Bilateral lens replacements are noted. Globes and orbits are otherwise unremarkable. CT CERVICAL SPINE FINDINGS Alignment: No significant  listhesis is present. Straightening of the normal cervical lordosis is noted. Skull base and vertebrae: Craniocervical junction is within limits. Vertebral body heights are normal. No acute or healing fractures are present. Soft tissues and spinal canal: No prevertebral fluid or swelling. No visible canal hematoma. Atherosclerotic calcifications are present at the carotid bifurcations bilaterally. Fat containing nodule in the right lobe of the thyroid is stable measuring up to 2 cm. Disc levels: C2-3: Asymmetric left-sided facet hypertrophy results in mild left  foraminal narrowing. C3-4: Facet hypertrophy is worse on the right. Moderate right foraminal narrowing is present. C4-5: Asymmetric left-sided uncovertebral and facet hypertrophy results in moderate left foraminal stenosis. C5-6: Uncovertebral spurring is present without significant stenosis. C6-7: Uncovertebral and facet hypertrophy contribute to mild foraminal narrowing bilaterally. C7-T1: Negative. Upper chest: Lung apices are clear. Thoracic inlet otherwise within normal limits. IMPRESSION: 1. No acute trauma to the head or cervical spine. 2. Stable atrophy and white matter disease. This likely reflects the sequela of chronic microvascular ischemia. 3. Multilevel degenerative changes of the cervical spine as described. 4. No acute or healing fractures. Electronically Signed   By: San Morelle M.D.   On: 01/21/2021 17:52    Procedures Procedures   Medications Ordered in ED Medications  sodium chloride 0.9 % bolus 1,000 mL (has no administration in time range)  insulin aspart (novoLOG) injection 12 Units (12 Units Subcutaneous Given 01/21/21 1914)    ED Course  I have reviewed the triage vital signs and the nursing notes.  Pertinent labs & imaging results that were available during my care of the patient were reviewed by me and considered in my medical decision making (see chart for details).  Clinical Course as of 01/21/21 1947  Fri Jan 21, 2021  1922 I had a discussion with the patient's wife, Lysle Dingwall.  She does not live with him all the time but has very close contact with him and states that she does stay there occasionally.  Patient lives with his daughters, all of the time.  His wife is concerned about frequent falling, last as described by patient, and also 2 months ago while he was in the tub.  She states also that the patient tends to drift to the right after he has been standing for a while.  He has been told that he had a "small stroke in the past."  He has not seen a  neurologist recently as far she knows.  She states that sometimes his speech is slurred.  He also frequently is incontinent therefore wears adult diapers.  He has an upcoming appointment with the [place], next month.  I explained that the patient was stable for discharge and that he needs to take his regular medications, and follow-up with the PCP.  She is agreeable to this.  I also will refer the patient to neurology for checkup.  This could be done either locally or at the [facility] hospital system. [EW]    Clinical Course User Index [EW] Daleen Bo, MD   MDM Rules/Calculators/A&P                           Patient Vitals for the past 24 hrs:  BP Temp Temp src Pulse Resp SpO2  01/21/21 1900 (!) 130/96 -- -- 75 16 96 %  01/21/21 1830 (!) 145/59 -- -- 75 17 93 %  01/21/21 1759 (!) 154/70 98.7 F (37.1 C) Oral 75 16 100 %  01/21/21 1547  128/65 98 F (36.7 C) Oral 77 16 100 %    7:41 PM Reevaluation with update and discussion. After initial assessment and treatment, an updated evaluation reveals no change in clinical status, plan discussed with the patient and all questions were answered. Daleen Bo   Medical Decision Making:  This patient is presenting for evaluation of generalized weakness.  Subacute fall., which does require a range of treatment options, and is a complaint that involves a moderate risk of morbidity and mortality. The differential diagnoses include injuries from fall, metabolic disorder, occult infection, CNS disorder. I decided to review old records, and in summary he is a debilitated patient was recently admitted to the hospital for sepsis, secondary to epididymitis.  He has T-cell leukemia, currently off methotrexate because of recurrent infections, and requires frequent red blood cell transfusions.  He has paroxysmal atrial fibrillation and is on Eliquis.  He has a history of diabetes which has been uncontrolled and congestive heart failure, diastolic.  I obtained additional  historical information from patient's wife.  Clinical Laboratory Tests Ordered, included CBC and Metabolic panel. Review indicates normal except white count high, hemoglobin low, MCV high, sodium low, chloride low, glucose high. Radiologic Tests Ordered, included CT head, CT cervical spine, chest x-ray.  I independently Visualized: Radiographic images, which show no acute abnormality    Critical Interventions-clinical evaluation, laboratory testing, radiography, IV fluids, observation reassessment  After These Interventions, the Patient was reevaluated and was found stable at baseline, without significant injuries from fall 2 weeks ago.  In the interim he has been relatively stable, eating, and drinking and walking per usual.  Patient has chronic debilities.  Blood sugar high, with history of noncompliance with medications however he and his wife think that he is taking his medication as directed.  Mild associated hyponatremia likely due to hyperglycemia.  Doubt serious bacterial infection, metabolic instability or impending vascular collapse.  Patient may benefit from outpatient neurology evaluation.  He needs to follow-up with his hematologist oncologist regarding anemia.  He is stable for discharge with outpatient management.  CRITICAL CARE-no Performed by: Daleen Bo  Nursing Notes Reviewed/ Care Coordinated Applicable Imaging Reviewed Interpretation of Laboratory Data incorporated into ED treatment  The patient appears reasonably screened and/or stabilized for discharge and I doubt any other medical condition or other [facility] requiring further screening, evaluation, or treatment in the ED at this time prior to discharge.  Plan: Home Medications-continue usual; Home Treatments-low-carb diet and plenty of fluids, water; return here if the recommended treatment, does not improve the symptoms; Recommended follow up-follow-up with neurology, oncology and PCP within 1 to 2 weeks.     Final  Clinical Impression(s) / ED Diagnoses Final diagnoses:  Hyperglycemia  Frequent falls  Anemia, unspecified type    Rx / DC Orders ED Discharge Orders     None        Daleen Bo, MD 01/21/21 1947

## 2021-01-21 NOTE — ED Provider Notes (Signed)
Emergency Medicine Provider Triage Evaluation Note  Tommy Peterson , a 80 y.o. male  was evaluated in triage.  Pt complains of generalized weakness x 2-3 weeks. Fall last Friday after feeling dizzy and blurred vision. Ambulatory since fall. No new numbness. No emesis, CP, SOB. No unrinary complaints   Review of Systems  Positive: weakness Negative: numbness  Physical Exam  BP 128/65 (BP Location: Left Arm)   Pulse 77   Temp 98 F (36.7 C) (Oral)   Resp 16   SpO2 100%  Gen:   Awake, no distress   Resp:  Normal effort  MSK:   Moves extremities without difficulty  Other:    Medical Decision Making  Medically screening exam initiated at 3:53 PM.  Appropriate orders placed.  YARDLEY LEKAS was informed that the remainder of the evaluation will be completed by another provider, this initial triage assessment does not replace that evaluation, and the importance of remaining in the ED until their evaluation is complete.  Fall, weakness   Jarome Trull A, PA-C 01/21/21 1555    Daleen Bo, MD 01/22/21 [phone]

## 2021-01-21 NOTE — ED Triage Notes (Addendum)
Pt reports generalized weakness x2 weeks. Pt states he was walking out of the grocery store and vision became spotty and he fell last Friday in a parking lot. Pt denies any injuries from fall. Pt reports right shoulder weakness before his fall. Denies any new symptoms since the fall or today.

## 2021-01-21 NOTE — Discharge Instructions (Addendum)
The testing today did not show any serious problems.  The blood sugar was high.  Make sure you are taking all your diabetes medicine as directed, drink a lot of water, and stay on a low carbohydrate diet.  Follow-up with your PCP for checkup in a week or so.  Consider seeing a neurologist to be evaluated for your history of "a small stroke in the past."  They also may be able to help determine why you sometimes get weak on the right side, when you are walking for about 20 minutes.  Return here if needed for further problems.

## 2021-01-26 ENCOUNTER — Telehealth: Payer: Self-pay | Admitting: *Deleted

## 2021-01-26 ENCOUNTER — Inpatient Hospital Stay: Payer: Medicare HMO | Admitting: Dietician

## 2021-01-26 ENCOUNTER — Inpatient Hospital Stay: Payer: Medicare HMO

## 2021-01-26 ENCOUNTER — Other Ambulatory Visit: Payer: Self-pay | Admitting: *Deleted

## 2021-01-26 ENCOUNTER — Other Ambulatory Visit: Payer: Self-pay

## 2021-01-26 DIAGNOSIS — C91Z0 Other lymphoid leukemia not having achieved remission: Secondary | ICD-10-CM | POA: Diagnosis not present

## 2021-01-26 DIAGNOSIS — D649 Anemia, unspecified: Secondary | ICD-10-CM

## 2021-01-26 DIAGNOSIS — D6489 Other specified anemias: Secondary | ICD-10-CM | POA: Diagnosis not present

## 2021-01-26 LAB — CMP (CANCER CENTER ONLY)
ALT: 32 U/L (ref 0–44)
AST: 14 U/L — ABNORMAL LOW (ref 15–41)
Albumin: 3.2 g/dL — ABNORMAL LOW (ref 3.5–5.0)
Alkaline Phosphatase: 70 U/L (ref 38–126)
Anion gap: 9 (ref 5–15)
BUN: 17 mg/dL (ref 8–23)
CO2: 26 mmol/L (ref 22–32)
Calcium: 8.9 mg/dL (ref 8.9–10.3)
Chloride: 100 mmol/L (ref 98–111)
Creatinine: 0.7 mg/dL (ref 0.61–1.24)
GFR, Estimated: >60 mL/min (ref >60)
Glucose, Bld: 299 mg/dL — ABNORMAL HIGH (ref 70–99)
Potassium: 4.4 mmol/L (ref 3.5–5.1)
Sodium: 135 mmol/L (ref 135–145)
Total Bilirubin: 1 mg/dL (ref 0.3–1.2)
Total Protein: 6 g/dL — ABNORMAL LOW (ref 6.5–8.1)

## 2021-01-26 LAB — CBC WITH DIFFERENTIAL/PLATELET
Abs Immature Granulocytes: 0.1 10*3/uL — ABNORMAL HIGH (ref 0.00–0.07)
Basophils Absolute: 0 10*3/uL (ref 0.0–0.1)
Basophils Relative: 0 %
Eosinophils Absolute: 0 10*3/uL (ref 0.0–0.5)
Eosinophils Relative: 0 %
HCT: 20.2 % — ABNORMAL LOW (ref 39.0–52.0)
Hemoglobin: 6.9 g/dL — CL (ref 13.0–17.0)
Immature Granulocytes: 1 %
Lymphocytes Relative: 57 %
Lymphs Abs: 4.8 10*3/uL — ABNORMAL HIGH (ref 0.7–4.0)
MCH: 38.8 pg — ABNORMAL HIGH (ref 26.0–34.0)
MCHC: 34.2 g/dL (ref 30.0–36.0)
MCV: 113.5 fL — ABNORMAL HIGH (ref 80.0–100.0)
Monocytes Absolute: 1.1 10*3/uL — ABNORMAL HIGH (ref 0.1–1.0)
Monocytes Relative: 12 %
Neutro Abs: 2.5 10*3/uL (ref 1.7–7.7)
Neutrophils Relative %: 30 %
Platelets: 218 10*3/uL (ref 150–400)
RBC: 1.78 MIL/uL — ABNORMAL LOW (ref 4.22–5.81)
RDW: 22.8 % — ABNORMAL HIGH (ref 11.5–15.5)
WBC: 8.5 10*3/uL (ref 4.0–10.5)
nRBC: 0 % (ref 0.0–0.2)

## 2021-01-26 LAB — SAMPLE TO BLOOD BANK

## 2021-01-26 LAB — PREPARE RBC (CROSSMATCH)

## 2021-01-26 MED ORDER — METHYLPREDNISOLONE SODIUM SUCC 40 MG IJ SOLR
40.0000 mg | Freq: Once | INTRAMUSCULAR | Status: AC
  Administered 2021-01-26: 40 mg via INTRAVENOUS

## 2021-01-26 MED ORDER — ACETAMINOPHEN 325 MG PO TABS
650.0000 mg | ORAL_TABLET | Freq: Once | ORAL | Status: AC
Start: 2021-01-26 — End: 2021-01-26
  Administered 2021-01-26: 650 mg via ORAL

## 2021-01-26 MED ORDER — SODIUM CHLORIDE 0.9% IV SOLUTION
250.0000 mL | Freq: Once | INTRAVENOUS | Status: AC
  Administered 2021-01-26: 250 mL via INTRAVENOUS
  Filled 2021-01-26: qty 250

## 2021-01-26 NOTE — Progress Notes (Signed)
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   Past medical history of BPH, chronic diarrhea, chronic UTIs, IDDM, COVID-19 infection.  Met with patient in infusion. He reports recent increased weakness associated with recent fall at grocery store. Patient reports appetite is okay, not eating as much as he used to. Yesterday he had an [place] muffin for breakfast, 2 cheeseburgers with mayo and tomato for lunch, says he probably ate something for dinner but does not remember. He drinks water, 50/50 tea, and diet coke. Patient will drink an Ensure every 3-4 days. He reports having 4 "wonderful girls" who are always checking in on him. Patient reports after his fall, he has home health who comes in twice/week. Patient says she fixes meals (anything he wants for breakfast), does laundry, and cleans for him. He is receiving PT 3 times/week, states he was worn out after session yesterday. Patient reports one recent episode of hyperglycemia, says blood sugar was +500 and was asymptomatic. Patient reports usual fasting blood sugar 90-130, takes 26 units insulin at bedtime. He is followed by endocrinologist, states his numbers are never good enough for her.    Medications: B12, B complex, Insulin   Labs: Glucose 299, Hgb 6.9   Anthropometrics: Weight 161 lb 13.1 oz on 5/26  Height: 5'10" Weight: 73.4 kg (5/26) UBW: 170 lb (per pt) BMI: 23.22    NUTRITION DIAGNOSIS: Unintentional weight loss related to large granular lymphocytic leukemia, transfusion dependent anemia as evidenced by 9 lbs (5.3%) under usual body weight   INTERVENTION:  Encouraged 3 meals plus 3 snacks daily Discussed high calorie, high protein snacks - handout provided Educated on not skipping meals and importance of including protein for improved glucose control - handout provided Patient agreeable to  drinking 1-2 Ensure/day Recommended Ensure Plus/equivalent (350 kcal, 16 grams protein) Ensure coupons given Contact information given   MONITORING, EVALUATION, GOAL: Patient will tolerate increased calories and protein to minimize weight loss   Next Visit: Wednesday July 20 in infusion

## 2021-01-26 NOTE — Telephone Encounter (Signed)
CRITICAL VALUE STICKER  CRITICAL VALUE: Hgb 6.9  RECEIVER (on-site recipient of call): Georgina Pillion, [place] NOTIFIED: 01/26/21; 1201  MESSENGER (representative from lab):Pam  MD NOTIFIED: Dr. Lorenso Courier  TIME OF NOTIFICATION: 1215  RESPONSE: Scheduled for 2 units PRBCs - Verbal order to proceed with transfusion confirmed with Dr. Lorenso Courier. Premeds Tylenol 650 mg PO and Solumedrol 40 mg IV. Orders confirmed with [facility].

## 2021-01-26 NOTE — Patient Instructions (Signed)
Blood Transfusion, Adult, Care After This sheet gives you information about how to care for yourself after your procedure. Your doctor may also give you more specific instructions. If youhave problems or questions, contact your doctor. What can I expect after the procedure? After the procedure, it is common to have: Bruising and soreness at the IV site. A fever or chills on the day of the procedure. This may be your body's response to the new blood cells received. A headache. Follow these instructions at home: Insertion site care     Follow instructions from your doctor about how to take care of your insertion site. This is where an IV tube was put into your vein. Make sure you: Wash your hands with soap and water before and after you change your bandage (dressing). If you cannot use soap and water, use hand sanitizer. Change your bandage as told by your doctor. Check your insertion site every day for signs of infection. Check for: Redness, swelling, or pain. Bleeding from the site. Warmth. Pus or a bad smell. General instructions Take over-the-counter and prescription medicines only as told by your doctor. Rest as told by your doctor. Go back to your normal activities as told by your doctor. Keep all follow-up visits as told by your doctor. This is important. Contact a doctor if: You have itching or red, swollen areas of skin (hives). You feel worried or nervous (anxious). You feel weak after doing your normal activities. You have redness, swelling, warmth, or pain around the insertion site. You have blood coming from the insertion site, and the blood does not stop with pressure. You have pus or a bad smell coming from the insertion site. Get help right away if: You have signs of a serious reaction. This may be coming from an allergy or the body's defense system (immune system). Signs include: Trouble breathing or shortness of breath. Swelling of the face or feeling warm  (flushed). Fever or chills. Head, chest, or back pain. Dark pee (urine) or blood in the pee. Widespread rash. Fast heartbeat. Feeling dizzy or light-headed. You may receive your blood transfusion in an outpatient setting. If so, youwill be told whom to contact to report any reactions. These symptoms may be an emergency. Do not wait to see if the symptoms will go away. Get medical help right away. Call your local emergency services (911 in the [place] Do not drive yourself to the hospital. Summary Bruising and soreness at the IV site are common. Check your insertion site every day for signs of infection. Rest as told by your doctor. Go back to your normal activities as told by your doctor. Get help right away if you have signs of a serious reaction. This information is not intended to replace advice given to you by your health care provider. Make sure you discuss any questions you have with your healthcare provider. Document Revised: 01/16/2019 Document Reviewed: 01/16/2019 Elsevier Patient Education  2022 Elsevier Inc.  

## 2021-01-27 DIAGNOSIS — R3915 Urgency of urination: Secondary | ICD-10-CM | POA: Diagnosis not present

## 2021-01-27 DIAGNOSIS — R350 Frequency of micturition: Secondary | ICD-10-CM | POA: Diagnosis not present

## 2021-01-27 DIAGNOSIS — R3914 Feeling of incomplete bladder emptying: Secondary | ICD-10-CM | POA: Diagnosis not present

## 2021-01-27 DIAGNOSIS — R351 Nocturia: Secondary | ICD-10-CM | POA: Diagnosis not present

## 2021-01-27 DIAGNOSIS — R3911 Hesitancy of micturition: Secondary | ICD-10-CM | POA: Diagnosis not present

## 2021-01-27 DIAGNOSIS — R3912 Poor urinary stream: Secondary | ICD-10-CM | POA: Diagnosis not present

## 2021-01-27 LAB — BPAM RBC
Blood Product Expiration Date: 202207262359
Blood Product Expiration Date: 202207262359
ISSUE DATE / TIME: 202206221322
ISSUE DATE / TIME: 202206221322
Unit Type and Rh: 5100
Unit Type and Rh: 5100

## 2021-01-27 LAB — TYPE AND SCREEN
ABO/RH(D): O POS
Antibody Screen: NEGATIVE
Unit division: 0
Unit division: 0

## 2021-02-01 ENCOUNTER — Telehealth: Payer: Self-pay | Admitting: Neurology

## 2021-02-01 NOTE — Telephone Encounter (Signed)
Patient called in to make an appointment with Dr. Jannifer Franklin. Stated he is having a hard time walking for more than a few minutes due to pain and weakness. I advised patient Dr. Tobey Grim next available is in August and if he is having concerning symptoms that need to be evaluated before then he should go to the ED. I also advised if he does not feel it is emergent he should see primary care provider to rule out anything else causing his symptoms. He is only wanting to see Dr. Jannifer Franklin. Advised I would send a note but he will not be back in the office until next week.

## 2021-02-01 NOTE — Telephone Encounter (Signed)
I called the patient.  Over the last 6 weeks, he has had episodes where he will have right arm and right leg weakness when he stands up, initially he is okay but within 15 minutes he starts drooping on the right arm and he cannot walk properly.  He does have some dizziness, he denies any speech or vision changes.  He has had several falls recently, he has also had multiple urinary tract infections with sepsis, he has had some general disability from this.  I will try to get him in earlier to see him, we need to exclude a problem with orthostasis in combination with focal cerebrovascular disease.

## 2021-02-09 ENCOUNTER — Inpatient Hospital Stay: Payer: Medicare HMO | Attending: Hematology

## 2021-02-09 ENCOUNTER — Inpatient Hospital Stay: Payer: Medicare HMO

## 2021-02-09 ENCOUNTER — Other Ambulatory Visit: Payer: Self-pay

## 2021-02-09 DIAGNOSIS — C91Z0 Other lymphoid leukemia not having achieved remission: Secondary | ICD-10-CM

## 2021-02-09 DIAGNOSIS — D649 Anemia, unspecified: Secondary | ICD-10-CM

## 2021-02-09 DIAGNOSIS — Z79899 Other long term (current) drug therapy: Secondary | ICD-10-CM | POA: Insufficient documentation

## 2021-02-09 LAB — CBC WITH DIFFERENTIAL/PLATELET
Abs Immature Granulocytes: 0.11 10*3/uL — ABNORMAL HIGH (ref 0.00–0.07)
Basophils Absolute: 0 10*3/uL (ref 0.0–0.1)
Basophils Relative: 0 %
Eosinophils Absolute: 0.1 10*3/uL (ref 0.0–0.5)
Eosinophils Relative: 1 %
HCT: 22.5 % — ABNORMAL LOW (ref 39.0–52.0)
Hemoglobin: 7.6 g/dL — ABNORMAL LOW (ref 13.0–17.0)
Immature Granulocytes: 1 %
Lymphocytes Relative: 58 %
Lymphs Abs: 5.8 10*3/uL — ABNORMAL HIGH (ref 0.7–4.0)
MCH: 35.2 pg — ABNORMAL HIGH (ref 26.0–34.0)
MCHC: 33.8 g/dL (ref 30.0–36.0)
MCV: 104.2 fL — ABNORMAL HIGH (ref 80.0–100.0)
Monocytes Absolute: 1 10*3/uL (ref 0.1–1.0)
Monocytes Relative: 10 %
Neutro Abs: 2.9 10*3/uL (ref 1.7–7.7)
Neutrophils Relative %: 30 %
Platelets: 246 10*3/uL (ref 150–400)
RBC: 2.16 MIL/uL — ABNORMAL LOW (ref 4.22–5.81)
RDW: 22.3 % — ABNORMAL HIGH (ref 11.5–15.5)
WBC: 9.9 10*3/uL (ref 4.0–10.5)
nRBC: 0 % (ref 0.0–0.2)

## 2021-02-09 LAB — CMP (CANCER CENTER ONLY)
ALT: 39 U/L (ref 0–44)
AST: 14 U/L — ABNORMAL LOW (ref 15–41)
Albumin: 3.1 g/dL — ABNORMAL LOW (ref 3.5–5.0)
Alkaline Phosphatase: 81 U/L (ref 38–126)
Anion gap: 6 (ref 5–15)
BUN: 18 mg/dL (ref 8–23)
CO2: 29 mmol/L (ref 22–32)
Calcium: 8.7 mg/dL — ABNORMAL LOW (ref 8.9–10.3)
Chloride: 103 mmol/L (ref 98–111)
Creatinine: 0.7 mg/dL (ref 0.61–1.24)
GFR, Estimated: >60 mL/min (ref >60)
Glucose, Bld: 257 mg/dL — ABNORMAL HIGH (ref 70–99)
Potassium: 3.9 mmol/L (ref 3.5–5.1)
Sodium: 138 mmol/L (ref 135–145)
Total Bilirubin: 0.8 mg/dL (ref 0.3–1.2)
Total Protein: 6.1 g/dL — ABNORMAL LOW (ref 6.5–8.1)

## 2021-02-09 LAB — PREPARE RBC (CROSSMATCH)

## 2021-02-09 LAB — SAMPLE TO BLOOD BANK

## 2021-02-09 MED ORDER — ACETAMINOPHEN 325 MG PO TABS
650.0000 mg | ORAL_TABLET | Freq: Once | ORAL | Status: AC
  Administered 2021-02-09: 650 mg via ORAL

## 2021-02-09 MED ORDER — ACETAMINOPHEN 325 MG PO TABS
ORAL_TABLET | ORAL | Status: AC
  Filled 2021-02-09: qty 2

## 2021-02-09 MED ORDER — METHYLPREDNISOLONE SODIUM SUCC 40 MG IJ SOLR
INTRAMUSCULAR | Status: AC
  Filled 2021-02-09: qty 1

## 2021-02-09 MED ORDER — SODIUM CHLORIDE 0.9% IV SOLUTION
250.0000 mL | Freq: Once | INTRAVENOUS | Status: AC
  Administered 2021-02-09: 250 mL via INTRAVENOUS
  Filled 2021-02-09: qty 250

## 2021-02-09 MED ORDER — METHYLPREDNISOLONE SODIUM SUCC 40 MG IJ SOLR
40.0000 mg | Freq: Once | INTRAMUSCULAR | Status: AC
  Administered 2021-02-09: 40 mg via INTRAVENOUS

## 2021-02-09 NOTE — Patient Instructions (Signed)
Blood Transfusion, Adult, Care After This sheet gives you information about how to care for yourself after your procedure. Your doctor may also give you more specific instructions. If youhave problems or questions, contact your doctor. What can I expect after the procedure? After the procedure, it is common to have: Bruising and soreness at the IV site. A fever or chills on the day of the procedure. This may be your body's response to the new blood cells received. A headache. Follow these instructions at home: Insertion site care     Follow instructions from your doctor about how to take care of your insertion site. This is where an IV tube was put into your vein. Make sure you: Wash your hands with soap and water before and after you change your bandage (dressing). If you cannot use soap and water, use hand sanitizer. Change your bandage as told by your doctor. Check your insertion site every day for signs of infection. Check for: Redness, swelling, or pain. Bleeding from the site. Warmth. Pus or a bad smell. General instructions Take over-the-counter and prescription medicines only as told by your doctor. Rest as told by your doctor. Go back to your normal activities as told by your doctor. Keep all follow-up visits as told by your doctor. This is important. Contact a doctor if: You have itching or red, swollen areas of skin (hives). You feel worried or nervous (anxious). You feel weak after doing your normal activities. You have redness, swelling, warmth, or pain around the insertion site. You have blood coming from the insertion site, and the blood does not stop with pressure. You have pus or a bad smell coming from the insertion site. Get help right away if: You have signs of a serious reaction. This may be coming from an allergy or the body's defense system (immune system). Signs include: Trouble breathing or shortness of breath. Swelling of the face or feeling warm  (flushed). Fever or chills. Head, chest, or back pain. Dark pee (urine) or blood in the pee. Widespread rash. Fast heartbeat. Feeling dizzy or light-headed. You may receive your blood transfusion in an outpatient setting. If so, youwill be told whom to contact to report any reactions. These symptoms may be an emergency. Do not wait to see if the symptoms will go away. Get medical help right away. Call your local emergency services (911 in the [place] Do not drive yourself to the hospital. Summary Bruising and soreness at the IV site are common. Check your insertion site every day for signs of infection. Rest as told by your doctor. Go back to your normal activities as told by your doctor. Get help right away if you have signs of a serious reaction. This information is not intended to replace advice given to you by your health care provider. Make sure you discuss any questions you have with your healthcare provider. Document Revised: 01/16/2019 Document Reviewed: 01/16/2019 Elsevier Patient Education  2022 Elsevier Inc.  

## 2021-02-10 ENCOUNTER — Encounter: Payer: Self-pay | Admitting: Neurology

## 2021-02-10 ENCOUNTER — Ambulatory Visit: Payer: Medicare HMO | Admitting: Neurology

## 2021-02-10 VITALS — BP 128/58 | HR 61 | Ht 69.0 in | Wt 156.0 lb

## 2021-02-10 DIAGNOSIS — G459 Transient cerebral ischemic attack, unspecified: Secondary | ICD-10-CM

## 2021-02-10 LAB — BPAM RBC
Blood Product Expiration Date: 202208082359
ISSUE DATE / TIME: 202207061315
Unit Type and Rh: 5100

## 2021-02-10 LAB — TYPE AND SCREEN
ABO/RH(D): O POS
Antibody Screen: NEGATIVE
Unit division: 0

## 2021-02-10 NOTE — Progress Notes (Signed)
Reason for visit: Possible TIA  Tommy Peterson is an 80 y.o. male  History of present illness:  Tommy Peterson is an 80 year old left-handed white male with a history of leukemia, diabetes and diabetic peripheral neuropathy.  He does have a chronic gait disorder associated with this neuropathy.  He claims that over the last 6 weeks he has had episodes on 4 occasions where he will be up walking for 10 to 15 minutes and then suddenly become dizzy and unsteady on his feet, he believes that his right arm and shoulder and right leg become weak and he has difficulty supporting himself.  He will have to sit down for 10 to 15 minutes and then the episode passes and he was able to walk again.  He does have a history of atrial fibrillation, he has a loop recorder in place but is not functional.  He just recently had a new loop recorder placed.  He denies any loss of consciousness with the events.  He denies any neck pain or low back pain.  The patient does currently get physical therapy through the [facility] hospital.  He has had multiple hospital admissions over the last several months, and August 31, 2020 he was admitted for atrial fibrillation, he had admissions for a severe urinary tract infection and urosepsis on 2 April and 09 Dec 2020.  He also had an admission on 30 Dec 2020 and an evaluation in the emergency room on 21 January 2021 for the episode of right-sided weakness.  The patient did have a CT scan of the head in June that did not show any acute changes.  The patient comes to this office for further evaluation.  He denies any vision changes, speech changes, or headaches with the above events.  The patient requires blood transfusions about every 2 weeks.  His most recent hemoglobin A1c was 7.6 which is about average for him.  Past Medical History:  Diagnosis Date   Acute respiratory failure ([place])    BCC (basal cell carcinoma of skin) 08/15/2016   w/SCC Left Forehead (treatment @ [facility])   BPH  (benign prostatic hyperplasia)    Carpal tunnel syndrome on both sides    Chronic diarrhea    intermittant due to chronic collagenous colitis   Chronic large granular lymphocytic leukemia ([place]) primary hemotologist-  [name]. 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--  treatment weekly methotraxate and transfusion's (last PRBCs 07-19-2017)   Collagenous colitis    chronic--- intermittant between diarrahea and constipation   ED (erectile dysfunction)    HCAP (healthcare-associated pneumonia)    History of adenomatous polyp of colon    History of Clostridium difficile colitis 05/2016   History of pneumonia 11/08/2017   CAP -- LLL---  12-19-2017  per pt no residual symptoms   History of SCC (squamous cell carcinoma) of skin    History of sepsis    11-20-2017 severe sepsis due to UTI;  10/ 2017sepsis due to c-diff colitis   Leucocytosis    chronic   Lower urinary tract symptoms (LUTS)    Macrocytic anemia    since 02/ 2015   MGUS (monoclonal gammopathy of unknown significance)    hemotologist-  [name] @ [facility]   Monoclonal paraproteinemia    Neuropathy, peripheral    Nodular basal cell carcinoma (BCC) 10/01/2018   Left  Neck(Nodular) Pt Does Not Want Treatment   Nodular basal cell carcinoma (BCC) 10/01/2018   Mid Forehead (treament curet and excision)   OA (osteoarthritis)    Prostatic stone    Rash of face    RIGHT SIDE   Raynaud's syndrome    Recurrent BCC (basal cell carcinoma) 03/05/2019   positive margin   SCC (squamous cell carcinoma) 08/10/2009   Central Forehead (Moh's Dr. Rhona Raider @ [facility])   SCC (squamous cell carcinoma) 12/08/2011   CIS-Left Cheek (treatment [facility] @ [facility])   SCC (squamous cell carcinoma) 07/09/2015   CIS-Left Forehead (treament @ [facility])   Septic shock ([place])    x 3 w/UTI   Squamous  cell carcinoma in situ (SCCIS) 12/08/2011   Left Cheek (treatment [facility] @ [facility])    Superficial basal cell carcinoma ([place]) 10/01/2018   Left Shin-Pt does not want treatment   Thin skin    fragile   Transfusion-dependent anemia since 10/ 2017   last transfusion PRBCs 07-19-2017  per hemologist note dated 10-25-2017   Type 2 diabetes mellitus treated with insulin ([place])    followed by [name] (pcp)   Urinary hesitancy    Wears contact lenses    LEFT EYE ONLY    Past Surgical History:  Procedure Laterality Date   BONE MARROW BIOPSY Right 05-24-2016;  08-20-2014   CARDIOVASCULAR STRESS TEST  11-26-2012   [name]  @ [facility] ([place]   normal nuclear study w/ no ischemia/  normal LV function and wall motion , ef 60% (find in care everywhere,epic)   CARDIOVERSION N/A 08/31/2020   Procedure: CARDIOVERSION;  Surgeon: [name], MD;  Location: [place];  Service: Cardiovascular;  Laterality: N/A;   CATARACT EXTRACTION W/ INTRAOCULAR LENS  IMPLANT, BILATERAL  2015   CYSTOSCOPY WITH LITHOLAPAXY N/A 12/25/2017   Procedure: CYSTOSCOPY WITH LITHOLAPAXY AND FULGERATION;  Surgeon: [name], MD;  Location: [place];  Service: Urology;  Laterality: N/A;   INGUINAL HERNIA REPAIR Right 1980s   LOOP RECORDER INSERTION N/A 04/10/2018   Procedure: LOOP RECORDER INSERTION;  Surgeon: [name], MD;  Location: [place] CV LAB;  Service: Cardiovascular;  Laterality: N/A;   PENILE PROSTHESIS IMPLANT  12-02-2015   dr [name] @ [facility] in [place] Left 08/2016   TEE WITHOUT CARDIOVERSION N/A 08/31/2020   Procedure: TRANSESOPHAGEAL ECHOCARDIOGRAM (TEE);  Surgeon: [name], MD;  Location: [place];  Service: Cardiovascular;  Laterality: N/A;   TONSILLECTOMY  child   TRANSURETHRAL RESECTION OF PROSTATE  2009   AND REPAIR RECURRENT RIGHT INGUINAL HERNIA    Family History  Problem Relation Age of Onset   Diabetes  Mother    Heart failure Mother     Social history:  reports that he quit smoking about 24 years ago. His smoking use included cigarettes. He has never used smokeless tobacco. He reports current alcohol use. He reports that he does not use drugs.    Allergies  Allergen Reactions   Bee Venom Swelling and Anaphylaxis   Metformin And Related Nausea And Vomiting   Sulfamethoxazole-Trimethoprim Nausea And Vomiting   Bee Pollen Other (See Comments)    Unknown    Metformin Hcl Other (See Comments)    sick     Medications:  Prior to Admission medications   Medication Sig Start Date End Date Taking? Authorizing Provider  amiodarone (PACERONE) 200 MG tablet Take 1 tablet (200 mg total) by  mouth daily. 09/29/20  Yes [facility], Tami Lin, PA  apixaban (ELIQUIS) 5 MG TABS tablet Take 1 tablet (5 mg total) by mouth 2 (two) times daily. 08/18/20  Yes Croitoru, Mihai, MD  B Complex Vitamins (B COMPLEX 100 PO) Take 1 tablet by mouth daily.   Yes [provider]  BD PEN NEEDLE NANO 2ND GEN 32G X 4 MM MISC  07/22/20  Yes [provider]  budesonide (ENTOCORT EC) 3 MG 24 hr capsule Take 9 mg by mouth daily.   Yes [provider]  cephALEXin (KEFLEX) 500 MG capsule Take 500 mg by mouth 4 (four) times daily.   Yes [provider]  Cranberry 500 MG TABS Take 500 mg by mouth 2 (two) times daily.   Yes [provider]  Cyanocobalamin (VITAMIN B-12) 5000 MCG SUBL Place 5,000 mcg under the tongue daily.   Yes [provider]  D-Mannose 500 MG CAPS Take 500 mg by mouth in the morning and at bedtime.   Yes [provider]  doxycycline (VIBRAMYCIN) 100 MG capsule Take 1 capsule (100 mg total) by mouth at bedtime. 01/02/21  Yes [name], MD  folic acid (FOLVITE) 1 MG tablet TAKE 1 TABLET BY MOUTH EVERY DAY Patient taking differently: Take 1 mg by mouth daily. 07/23/20  Yes Brunetta Genera, MD  furosemide (LASIX) 40 MG tablet Take 20 mg by mouth  daily.   Yes [provider]  Insulin Glargine-Lixisenatide 100-33 UNT-MCG/ML SOPN Inject 26 Units into the skin at bedtime. [place] 100/33   Yes [provider]  insulin lispro (HUMALOG) 100 UNIT/ML KwikPen Inject 5 Units into the skin 3 (three) times daily.   Yes [provider]  metoprolol tartrate (LOPRESSOR) 25 MG tablet Take 1 tablet (25 mg total) by mouth 2 (two) times daily. 01/03/21  Yes Shawna Clamp, MD  omeprazole (PRILOSEC) 40 MG capsule Take 40 mg by mouth daily.   Yes [provider]  pioglitazone (ACTOS) 15 MG tablet Take 15 mg by mouth every morning. 10/18/17  Yes [provider]  Vibegron (GEMTESA) 75 MG TABS Take 75 mg by mouth daily.   Yes [provider]  vitamin B-12 (CYANOCOBALAMIN) 500 MCG tablet Take 500 mcg by mouth daily.   Yes [provider]    ROS:  Out of a complete 14 system review of symptoms, the patient complains only of the following symptoms, and all other reviewed systems are negative.  Fatigue Walking difficulty, weakness   Blood pressure (!) 128/58, pulse 61, height _0  (1.753 m), weight 156 lb (70.8 kg).  Blood pressure, right arm, sitting is 158/60.  Blood pressure, right arm, standing is 144/60.  Physical Exam  General: The patient is alert and cooperative at the time of the examination.  Skin: No significant peripheral edema is noted.   Neurologic Exam  Mental status: The patient is alert and oriented x 3 at the time of the examination. The patient has apparent normal recent and remote memory, with an apparently normal attention span and concentration ability.   Cranial nerves: Facial symmetry is present. Speech is normal, no aphasia or dysarthria is noted. Extraocular movements are full. Visual fields are full.  Motor: The patient has good strength in all 4 extremities, with exception some weakness of hip flexion bilaterally, more prominent on the right leg.  Sensory  examination: Soft touch sensation is symmetric on the face, arms, and legs.  Coordination: The patient has good finger-nose-finger and heel-to-shin bilaterally.  Gait and station: The patient has a slightly wide-based gait, he can walk independently.  Tandem gait was not attempted.  Romberg is negative but is slightly unsteady.  The patient is unable to arise from a seated position with arms crossed.  Reflexes: Deep tendon reflexes are symmetric.   CT head and cervical 01/21/21:  IMPRESSION: 1. No acute trauma to the head or cervical spine. 2. Stable atrophy and white matter disease. This likely reflects the sequela of chronic microvascular ischemia. 3. Multilevel degenerative changes of the cervical spine as described. 4. No acute or healing fractures.  * CT scan images were reviewed online. I agree with the written report.    Assessment/Plan:  1.  Diabetic peripheral neuropathy  2.  Gait disturbance  3.  History of atrial fibrillation  4.  Episodic right-sided weakness, dizziness  The patient does have atrial fibrillation, he indicates that he has had documented heart rates in the 140 range.  He does have a loop recorder that was recently placed, but he has not gotten that set up, it is not functional.  The patient will be set up for MRA of the head and neck to evaluate for a fixed vascular blockage that could lead to transient unilateral weakness.  The patient will follow-up in 4 months.  Jill Alexanders MD 02/10/2021 10:56 AM  [facility] [address] [place], [place] [address]  Phone [phone] Fax [phone]

## 2021-02-14 ENCOUNTER — Telehealth: Payer: Self-pay | Admitting: Neurology

## 2021-02-14 NOTE — Telephone Encounter (Signed)
I called [employer] @ [phone] and spoke with Angie to create a case for MRA head wo contrast and MRA neck w/wo contrast. Angie states that I will need to fax notes to [phone] for review. The patient will have this done at the hospital because he has a pacemaker/loop recorder. The request is pending. Case #[id number].

## 2021-02-16 NOTE — Telephone Encounter (Signed)
Received approval from insurance. PA #[id number] (02/16/21- 08/15/21). Orders faxed to hospital, they will call patient to schedule. Scheduling phone #- [phone].

## 2021-02-17 NOTE — Telephone Encounter (Signed)
Mandy with [facility] is requesting an additional call back.  Phone #: [phone] Fax #: [phone]

## 2021-02-17 NOTE — Telephone Encounter (Signed)
Returned the call to [facility] at Urology.  Left her a detailed message on her confidential voicemail that pt has an appointment 02/21/2021 and clearance will be addressed at that time.  Did also advise if she had further questions, she can call the office back.

## 2021-02-21 ENCOUNTER — Other Ambulatory Visit: Payer: Self-pay

## 2021-02-21 ENCOUNTER — Ambulatory Visit (INDEPENDENT_AMBULATORY_CARE_PROVIDER_SITE_OTHER): Payer: No Typology Code available for payment source | Admitting: Cardiovascular Disease

## 2021-02-21 ENCOUNTER — Telehealth: Payer: Self-pay | Admitting: *Deleted

## 2021-02-21 ENCOUNTER — Encounter: Payer: Self-pay | Admitting: Cardiovascular Disease

## 2021-02-21 VITALS — BP 160/62 | HR 67 | Ht 69.5 in | Wt 160.8 lb

## 2021-02-21 DIAGNOSIS — I700 Atherosclerosis of aorta: Secondary | ICD-10-CM

## 2021-02-21 DIAGNOSIS — Z4509 Encounter for adjustment and management of other cardiac device: Secondary | ICD-10-CM | POA: Diagnosis not present

## 2021-02-21 DIAGNOSIS — R55 Syncope and collapse: Secondary | ICD-10-CM

## 2021-02-21 DIAGNOSIS — Z5181 Encounter for therapeutic drug level monitoring: Secondary | ICD-10-CM | POA: Diagnosis not present

## 2021-02-21 DIAGNOSIS — I480 Paroxysmal atrial fibrillation: Secondary | ICD-10-CM

## 2021-02-21 DIAGNOSIS — B999 Unspecified infectious disease: Secondary | ICD-10-CM

## 2021-02-21 DIAGNOSIS — Z79899 Other long term (current) drug therapy: Secondary | ICD-10-CM | POA: Diagnosis not present

## 2021-02-21 DIAGNOSIS — C91Z0 Other lymphoid leukemia not having achieved remission: Secondary | ICD-10-CM

## 2021-02-21 DIAGNOSIS — E119 Type 2 diabetes mellitus without complications: Secondary | ICD-10-CM

## 2021-02-21 NOTE — Patient Instructions (Addendum)
Medication Instructions:  STOP the Eliquis  *If you need a refill on your cardiac medications before your next appointment, please call your pharmacy*   Lab Work: Your provider would like for you to have the following labs today: CMET and TSH  If you have labs (blood work) drawn today and your tests are completely normal, you will receive your results only by: [place] (if you have MyChart) OR A paper copy in the mail If you have any lab test that is abnormal or we need to change your treatment, we will call you to review the results.   Testing/Procedures: None ordered   Follow-Up: At [facility], you and your health needs are our priority.  As part of our continuing mission to provide you with exceptional heart care, we have created designated Provider Care Teams.  These Care Teams include your primary Cardiologist (physician) and Advanced Practice Providers (APPs -  Physician Assistants and Nurse Practitioners) who all work together to provide you with the care you need, when you need it.  We recommend signing up for the patient portal called "MyChart".  Sign up information is provided on this After Visit Summary.  MyChart is used to connect with patients for Virtual Visits (Telemedicine).  Patients are able to view lab/test results, encounter notes, upcoming appointments, etc.  Non-urgent messages can be sent to your provider as well.   To learn more about what you can do with MyChart, go to [email or web address].    Your next appointment:   6 month(s)  The format for your next appointment:   In Person  Provider:   [name], MD

## 2021-02-21 NOTE — Progress Notes (Signed)
Cardiology Office Note:    Date:  02/24/2021   ID:  Tommy Peterson, DOB [birth year], MRN [id number]  PCP:  Tommy Morning, DO  Cardiologist:  Tommy Peterson  Chief Complaint  Patient presents with   Atrial Fibrillation     History of Present Illness:    Tommy Peterson is a 80 y.o. 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  A loop recorder was implanted in 2019 and for couple of years did not show any rhythm irregularities, other than paroxysmal atrial tachycardia.  At the patient's request, we stopped performing regular downloads to avoid billing.   He subsequently presented with symptomatic atrial fibrillation with rapid ventricular response in January 2022, started anticoagulation with Eliquis and metoprolol for rate control.  Rate control was difficult to obtain and the patient became more more symptomatic, although he was also having worsening anemia problems.  He was scheduled for TEE cardioversion but presented in sinus rhythm on 01/28. atrial fibrillation recurred in February and amiodarone was started. He was hospitalized 04/02-04/04 for UTI-related E.coli sepsis. He has chronic diarrhea (predates hospitalization and antibiotic), attributed to celiac disease and his GI specialist "cannot help him anymore".  He maintained sinus rhythm during the hospital stay.  He has had at least one syncopal event, probably due to orthostatic hypotension. He gets up frequently to urinate, but does not tolerate alpha blockers. His diastolic BP is usually in the 50s. His last fall was just 6 weeks ago, this time without head injury. He has mild ankle edema, but did not tolerate furosemide doses >20 mg daily due to urinary frequency. He takes Actos and long acting insulin (rarely compliant with mealtime short  acting insulin).  He has a new loop recorder monitor from [employer], but has not plugged it in. He has not had any atrial fibrillation since starting amiodarone about 4 months ago.  He has had a few very brief episodes of atrial tachycardia.  He is completely unaware of palpitations when atrial fibrillation or atrial tachycardia occur.  He continues to have mild edema of both ankles.  He is weak.  He has not had falls.  He has not had any other syncopal events.  He denies chest pain or overt bleeding.  His most recent hemoglobin was around his baseline at 7.6 on 02/09/2021.  As before he has significant macrocytosis.  Renal function is normal with a creatinine of 0.7.  He also has relative neutropenia (30%, but absolute neutrophils normal at 3000); platelet count is normal..  He is accompanied today by his daughter and she helps with the review of systems.  CHA2DS2-VASc Score = 43 (old stroke, age 30, HTN -questionable dx, DM, atherosclerosis on imaging - but no clinical CAD/PAD), 7.2% stroke risk per year.  At best, has a CHA2DS2-VASc score of 5.  HASBLED 2.    He does not have a history of coronary artery disease or peripheral arterial disease.  In 2017 he was admitted with an episode of chest pain and his work-up for ischemia was negative.  Nevertheless, there was evidence of aortic and coronary calcifications on a CT of the chest performed in January 2018.  Past Medical History:  Diagnosis Date   Acute respiratory failure (Tommy Peterson)    BCC (basal cell carcinoma of skin) 08/15/2016   w/SCC Left Forehead (treatment @ [facility])   BPH (benign prostatic hyperplasia)  Carpal tunnel syndrome on both sides    Chronic diarrhea    intermittant due to chronic collagenous colitis   Chronic large granular lymphocytic leukemia ([place]) primary hemotologist-  [name]. 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 and transfuion dependant anemia--  treatment weekly methotraxate and transfusion's (last PRBCs 07-19-2017)   Collagenous colitis    chronic--- intermittant between diarrahea and constipation   ED (erectile dysfunction)    HCAP (healthcare-associated pneumonia)    History of adenomatous polyp of colon    History of Clostridium difficile colitis 05/2016   History of pneumonia 11/08/2017   CAP -- LLL---  12-19-2017  per pt no residual symptoms   History of SCC (squamous cell carcinoma) of skin    History of sepsis    11-20-2017 severe sepsis due to UTI;  10/ 2017sepsis due to c-diff colitis   Leucocytosis    chronic   Lower urinary tract symptoms (LUTS)    Macrocytic anemia    since 02/ 2015   MGUS (monoclonal gammopathy of unknown significance)    hemotologist-  [name] @ [facility]   Monoclonal paraproteinemia    Neuropathy, peripheral    Nodular basal cell carcinoma (BCC) 10/01/2018   Left Neck(Nodular) Pt Does Not Want Treatment   Nodular basal cell carcinoma (BCC) 10/01/2018   Mid Forehead (treament curet and excision)   OA (osteoarthritis)    Prostatic stone    Rash of face    RIGHT SIDE   Raynaud's syndrome    Recurrent BCC (basal cell carcinoma) 03/05/2019   positive margin   SCC (squamous cell carcinoma) 08/10/2009   Central Forehead (Moh's Dr. Rhona Raider @ [facility])   SCC (squamous cell carcinoma) 12/08/2011   CIS-Left Cheek (treatment Aldara @ [facility])   SCC (squamous cell carcinoma) 07/09/2015   CIS-Left Forehead (treament @ [facility])   Septic shock ([place])    x 3 w/UTI   Squamous cell carcinoma in situ (SCCIS) 12/08/2011   Left Cheek (treatment Aldara @ [facility])    Superficial basal cell carcinoma ([place]) 10/01/2018   Left Shin-Pt does not want treatment   Thin skin    fragile   Transfusion-dependent anemia since 10/ 2017   last transfusion PRBCs 07-19-2017  per hemologist note dated 10-25-2017   Type 2 diabetes mellitus treated with  insulin ([place])    followed by [name] (pcp)   Urinary hesitancy    Wears contact lenses    LEFT EYE ONLY    Past Surgical History:  Procedure Laterality Date   BONE MARROW BIOPSY Right 05-24-2016;  08-20-2014   CARDIOVASCULAR STRESS TEST  11-26-2012   [name]  @ [place] ([place]   normal nuclear study w/ no ischemia/  normal LV function and wall motion , ef 60% (find in care everywhere,epic)   CARDIOVERSION N/A 08/31/2020   Procedure: CARDIOVERSION;  Surgeon: [name], MD;  Location: [facility];  Service: Cardiovascular;  Laterality: N/A;   CATARACT EXTRACTION W/ INTRAOCULAR LENS  IMPLANT, BILATERAL  2015   CYSTOSCOPY WITH LITHOLAPAXY N/A 12/25/2017   Procedure: CYSTOSCOPY WITH LITHOLAPAXY AND FULGERATION;  Surgeon: [name], MD;  Location: [facility];  Service: Urology;  Laterality: N/A;   INGUINAL HERNIA REPAIR Right 1980s   LOOP RECORDER INSERTION N/A 04/10/2018   Procedure: LOOP RECORDER INSERTION;  Surgeon: [name], MD;  Location: [facility];  Service: Cardiovascular;  Laterality: N/A;   PENILE PROSTHESIS IMPLANT  12-02-2015   dr [name] @ [facility] in [place] Left 08/2016   TEE WITHOUT CARDIOVERSION N/A 08/31/2020   Procedure: TRANSESOPHAGEAL ECHOCARDIOGRAM (TEE);  Surgeon: [name], MD;  Location: [facility] ENDOSCOPY;  Service: Cardiovascular;  Laterality: N/A;   TONSILLECTOMY  child   TRANSURETHRAL RESECTION OF PROSTATE  2009   AND REPAIR RECURRENT RIGHT INGUINAL HERNIA    Current Medications: No outpatient medications have been marked as taking for the 02/21/21 encounter (Office Visit) with [name], MD.     Allergies:   Bee venom, Metformin and related, Sulfamethoxazole-trimethoprim, Bee pollen, and Metformin hcl   Social History   Socioeconomic History   Marital status: Widowed    Spouse name: engaged   Number of children: 8   Years of education: college-2   Highest  education level: Not on file  Occupational History   Occupation: Press photographer   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  Tobacco Use   Smoking status: Former    Years: 20.00    Types: Cigarettes    Quit date: 05/04/1996    Years since quitting: 24.8   Smokeless tobacco: Never  Vaping Use   Vaping Use: Never used  Substance and Sexual Activity   Alcohol use: Yes    Comment: 1-2 glasses of wine every other day   Drug use: No   Sexual activity: Never  Other Topics Concern   Not on file  Social History Narrative   Not on file   Social Determinants of Health   Financial Resource Strain: Not on file  Food Insecurity: Not on file  Transportation Needs: Not on file  Physical Activity: Not on file  Stress: Not on file  Social Connections: Not on file     Family History: The patient's family history includes Diabetes in his mother; Heart failure in his mother.  ROS:   Please see the history of present illness.   All other systems are reviewed and are negative.  EKGs/Labs/Other Studies Reviewed:    The following studies were reviewed today: Comprehensive download of his loop recorder in the office today.  Echo 11/06/2020  1. Left ventricular ejection fraction, by estimation, is 60 to 65%. The  left ventricle has normal function. The left ventricle has no regional  wall motion abnormalities. The left ventricular internal cavity size was  mildly dilated. Left ventricular  diastolic parameters are consistent with Grade II diastolic dysfunction  (pseudonormalization).   2. Right ventricular systolic function is normal. The right ventricular  size is normal. There is moderately elevated pulmonary artery systolic  pressure.   3. Left atrial size was mild to moderately dilated.   4. Right atrial size was mild to moderately dilated.   5. The pericardial effusion is circumferential.   6. The mitral valve is normal in structure. Mild to moderate mitral valve  regurgitation. No evidence of  mitral stenosis.   7. The aortic valve is tricuspid. There is mild calcification of the  aortic valve. There is mild thickening of the aortic valve. Aortic valve  regurgitation is not visualized. Mild to moderate aortic valve  sclerosis/calcification is present, without any  evidence of aortic stenosis.   8. The inferior vena cava is dilated in size with >50% respiratory  variability, suggesting right atrial pressure of 8 mmHg.   Comparison(s): The left ventricular function has improved.   EKG:  EKG is ordered today.  It shows atrial fibrillation with rapid ventricular response at 136 bpm, unchanged QS pattern in leads V1-V2, borderline QTC 454 ms.  Recent Labs: 11/06/2020: B Natriuretic Peptide 389.9 12/31/2020: TSH 0.826 01/03/2021: Magnesium 1.9 02/23/2021: ALT 48; BUN 13; Creatinine 0.44; Hemoglobin 6.8; Platelets 199; Potassium 4.4; Sodium 135  Recent Lipid Panel    Component Value Date/Time   CHOL 111 05/05/2016 0402   TRIG 111 04/07/2020 0055   HDL 27 (L) 05/05/2016 0402   CHOLHDL 4.1 05/05/2016 0402   VLDL 21 05/05/2016 0402   LDLCALC 63 05/05/2016 0402    Physical Exam:    VS:  BP (!) 160/62   Pulse 67   Ht 5' 9.5" (1.765 m)   Wt 160 lb 12.8 oz (72.9 kg)   SpO2 98%   BMI 23.41 kg/m     Wt Readings from Last 3 Encounters:  02/21/21 160 lb 12.8 oz (72.9 kg)  02/10/21 156 lb (70.8 kg)  12/30/20 161 lb 13.1 oz (73.4 kg)      General: Alert, oriented x3, no distress, pale Head: no evidence of trauma, PERRL, EOMI, no exophtalmos or lid lag, no myxedema, no xanthelasma; normal ears, nose and oropharynx Neck: normal jugular venous pulsations and no hepatojugular reflux; brisk carotid pulses without delay and no carotid bruits Chest: clear to auscultation, no signs of consolidation by percussion or palpation, normal fremitus, symmetrical and full respiratory excursions Cardiovascular: normal position and quality of the apical impulse, regular rhythm, normal first and  second heart sounds, no murmurs, rubs or gallops Abdomen: no tenderness or distention, no masses by palpation, no abnormal pulsatility or arterial bruits, normal bowel sounds, no hepatosplenomegaly Extremities: no clubbing, cyanosis; symmetrical 1+ ankle edema; 2+ radial, ulnar and brachial pulses bilaterally; 2+ right femoral, posterior tibial and dorsalis pedis pulses; 2+ left femoral, posterior tibial and dorsalis pedis pulses; no subclavian or femoral bruits Neurological: grossly nonfocal Psych: Normal mood and affect   ECG: ordered today, Personally reviewed, shows sinus rhythm, voltage criteria for left ventricular hypertrophy and a broadened QRS at 116 ms CHA2DS2-VASc Score = 7  The patient's score is based upon: CHF History: No HTN History: Yes Diabetes History: Yes Stroke History: Yes Vascular Disease History: Yes Age Score: 2 Gender Score: 0      ASSESSMENT AND PLAN: Paroxysmal Atrial Fibrillation (ICD10:  I48.0) The patient's CHA2DS2-VASc score is 7, indicating a 11.2% annual risk of stroke.    Secondary Hypercoagulable State (ICD10:  D68.69) The patient is at significant risk for stroke/thromboembolism based upon his CHA2DS2-VASc Score of 7.  Continue Apixaban (Eliquis).    ASSESSMENT:    1. Paroxysmal atrial fibrillation (HCC)   2. Encounter for monitoring amiodarone therapy   3. Encounter for loop recorder check   4. Syncope and collapse   5. Large granular lymphocytic leukemia ([place])   6. Type 2 diabetes mellitus without complication, without long-term current use of insulin (HCC)   7. Aortic atherosclerosis ([place])   8. Recurrent infections     PLAN:    In order of problems listed above:  AFib: He is completely arrhythmia unaware.  Fortunately no arrhythmia recurrence in about 4 months on amiodarone.  He is appropriately anticoagulated, but his recent fall raises concerned about the safety of anticoagulation long-term. Not sure about role of Watchman due to  his comorbid conditions. Stop Eliquis and monitor for recurrence via ILR, can resume Eliquis if AFib recurs. Amiodarone: check LFTs and TSH at least every 6 months.  Most recent liver function  tests were performed earlier this month and TSH was performed in May.  They were normal. ILR: Comprehensive device check today.  Imperative to have constant monitoring if we interrupt the Eliquis. Syncope: probably orthostatic hypotension, occurring after micturition, in the early hours of the Peterson, with quick recovery once he was horizontal.  The same reason he is intolerant of alpha blockers or higher doses of diuretic.  At risk for falls. Leukemia/chronic anemia: Requires frequent transfusions. Last Hgb 7.6 after transfusion. Rechecks in AM. Sees Dr. Irene Limbo. DM: further deterioration (A1c 9.1% on 12/30/2020), in part due to acute illness.  On insulin and Actos (the latter probably causes edema).  Does have aortic and coronary atherosclerosis on imaging studies, but no clinical CAD/PAD. Unlikely to tolerate SGLT2 inh due to frequent urination and infections. Allergic to metformin. Encouraged compliance with insulin. Aortic and coronary atherosclerosis: Noted on CT chest.  He has never had angina pectoris but has never had any functional studies for CAD.  He does not appear to be a great candidate for invasive evaluation or treatment at this time, so no rush to perform evaluation as long as he is asymptomatic.Marland Kitchen Recent E.coli sepsis:  He reports this is his 4th event of this type. Now on daily doxycycline suppression.  Patient Instructions  Medication Instructions:  STOP the Eliquis  *If you need a refill on your cardiac medications before your next appointment, please call your pharmacy*   Lab Work: Your provider would like for you to have the following labs today: CMET and TSH  If you have labs (blood work) drawn today and your tests are completely normal, you will receive your results only by: [place] (if you have MyChart) OR A paper copy in the mail If you have any lab test that is abnormal or we need to change your treatment, we will call you to review the results.   Testing/Procedures: None ordered   Follow-Up: At [facility], you and your health needs are our priority.  As part of our continuing mission to provide you with exceptional heart care, we have created designated Provider Care Teams.  These Care Teams include your primary Cardiologist (physician) and Advanced Practice Providers (APPs -  Physician Assistants and Nurse Practitioners) who all work together to provide you with the care you need, when you need it.  We recommend signing up for the patient portal called "MyChart".  Sign up information is provided on this After Visit Summary.  MyChart is used to connect with patients for Virtual Visits (Telemedicine).  Patients are able to view lab/test results, encounter notes, upcoming appointments, etc.  Non-urgent messages can be sent to your provider as well.   To learn more about what you can do with MyChart, go to [email or web address].    Your next appointment:   6 month(s)  The format for your next appointment:   In Person  Provider:   [name], MD   Medication Adjustments/Labs and Tests Ordered: Current medicines are reviewed at length with the patient today.  Concerns regarding medicines are outlined above.  Orders Placed This Encounter  Procedures   Comprehensive metabolic panel   TSH   EKG 12-Lead    No orders of the defined types were placed in this encounter.   Patient Instructions  Medication Instructions:  STOP the Eliquis  *If you need a refill on your cardiac medications before your next appointment, please call your pharmacy*   Lab Work: Your provider would like for you to have  the following labs today: CMET and TSH  If you have labs (blood work) drawn today and your tests are completely normal, you will receive your  results only by: [place] (if you have MyChart) OR A paper copy in the mail If you have any lab test that is abnormal or we need to change your treatment, we will call you to review the results.   Testing/Procedures: None ordered   Follow-Up: At [facility], you and your health needs are our priority.  As part of our continuing mission to provide you with exceptional heart care, we have created designated Provider Care Teams.  These Care Teams include your primary Cardiologist (physician) and Advanced Practice Providers (APPs -  Physician Assistants and Nurse Practitioners) who all work together to provide you with the care you need, when you need it.  We recommend signing up for the patient portal called "MyChart".  Sign up information is provided on this After Visit Summary.  MyChart is used to connect with patients for Virtual Visits (Telemedicine).  Patients are able to view lab/test results, encounter notes, upcoming appointments, etc.  Non-urgent messages can be sent to your provider as well.   To learn more about what you can do with MyChart, go to [email or web address].    Your next appointment:   6 month(s)  The format for your next appointment:   In Person  Provider:   [name], MD     Signed, [name], MD  02/24/2021 5:01 PM    [place]

## 2021-02-21 NOTE — Telephone Encounter (Signed)
Called the patient to remind him that he can stop the Eliquis for now. He will need to hook his Loop Recorder monitor back up so that he can be monitored for afib. If he is in afib then he may need to go back on Eliquis. He has verbalized his understanding.

## 2021-02-23 ENCOUNTER — Inpatient Hospital Stay: Payer: Medicare HMO

## 2021-02-23 ENCOUNTER — Telehealth: Payer: Self-pay

## 2021-02-23 ENCOUNTER — Other Ambulatory Visit: Payer: Self-pay

## 2021-02-23 ENCOUNTER — Inpatient Hospital Stay: Payer: Medicare HMO | Admitting: Dietician

## 2021-02-23 DIAGNOSIS — C91Z0 Other lymphoid leukemia not having achieved remission: Secondary | ICD-10-CM

## 2021-02-23 DIAGNOSIS — D649 Anemia, unspecified: Secondary | ICD-10-CM

## 2021-02-23 DIAGNOSIS — Z79899 Other long term (current) drug therapy: Secondary | ICD-10-CM | POA: Diagnosis not present

## 2021-02-23 LAB — CBC WITH DIFFERENTIAL/PLATELET
Abs Immature Granulocytes: 0.14 10*3/uL — ABNORMAL HIGH (ref 0.00–0.07)
Basophils Absolute: 0 10*3/uL (ref 0.0–0.1)
Basophils Relative: 0 %
Eosinophils Absolute: 0 10*3/uL (ref 0.0–0.5)
Eosinophils Relative: 0 %
HCT: 20.6 % — ABNORMAL LOW (ref 39.0–52.0)
Hemoglobin: 6.8 g/dL — CL (ref 13.0–17.0)
Immature Granulocytes: 1 %
Lymphocytes Relative: 52 %
Lymphs Abs: 5.8 10*3/uL — ABNORMAL HIGH (ref 0.7–4.0)
MCH: 32.9 pg (ref 26.0–34.0)
MCHC: 33 g/dL (ref 30.0–36.0)
MCV: 99.5 fL (ref 80.0–100.0)
Monocytes Absolute: 1.5 10*3/uL — ABNORMAL HIGH (ref 0.1–1.0)
Monocytes Relative: 13 %
Neutro Abs: 3.8 10*3/uL (ref 1.7–7.7)
Neutrophils Relative %: 34 %
Platelets: 199 10*3/uL (ref 150–400)
RBC: 2.07 MIL/uL — ABNORMAL LOW (ref 4.22–5.81)
RDW: 20 % — ABNORMAL HIGH (ref 11.5–15.5)
WBC: 11.3 10*3/uL — ABNORMAL HIGH (ref 4.0–10.5)
nRBC: 0 % (ref 0.0–0.2)

## 2021-02-23 LAB — CMP (CANCER CENTER ONLY)
ALT: 48 U/L — ABNORMAL HIGH (ref 0–44)
AST: 19 U/L (ref 15–41)
Albumin: 3.2 g/dL — ABNORMAL LOW (ref 3.5–5.0)
Alkaline Phosphatase: 67 U/L (ref 38–126)
Anion gap: 9 (ref 5–15)
BUN: 13 mg/dL (ref 8–23)
CO2: 28 mmol/L (ref 22–32)
Calcium: 8.8 mg/dL — ABNORMAL LOW (ref 8.9–10.3)
Chloride: 98 mmol/L (ref 98–111)
Creatinine: 0.44 mg/dL — ABNORMAL LOW (ref 0.61–1.24)
GFR, Estimated: >60 mL/min (ref >60)
Glucose, Bld: 223 mg/dL — ABNORMAL HIGH (ref 70–99)
Potassium: 4.4 mmol/L (ref 3.5–5.1)
Sodium: 135 mmol/L (ref 135–145)
Total Bilirubin: 1 mg/dL (ref 0.3–1.2)
Total Protein: 6 g/dL — ABNORMAL LOW (ref 6.5–8.1)

## 2021-02-23 LAB — PREPARE RBC (CROSSMATCH)

## 2021-02-23 MED ORDER — ACETAMINOPHEN 325 MG PO TABS
650.0000 mg | ORAL_TABLET | Freq: Once | ORAL | Status: AC
  Administered 2021-02-23: 650 mg via ORAL

## 2021-02-23 MED ORDER — SODIUM CHLORIDE 0.9% FLUSH
3.0000 mL | INTRAVENOUS | Status: DC | PRN
  Filled 2021-02-23: qty 10

## 2021-02-23 MED ORDER — METHYLPREDNISOLONE SODIUM SUCC 40 MG IJ SOLR
INTRAMUSCULAR | Status: AC
  Filled 2021-02-23: qty 1

## 2021-02-23 MED ORDER — HEPARIN SOD (PORK) LOCK FLUSH 100 UNIT/ML IV SOLN
250.0000 [IU] | INTRAVENOUS | Status: DC | PRN
  Filled 2021-02-23: qty 5

## 2021-02-23 MED ORDER — ACETAMINOPHEN 325 MG PO TABS
ORAL_TABLET | ORAL | Status: AC
  Filled 2021-02-23: qty 2

## 2021-02-23 MED ORDER — METHYLPREDNISOLONE SODIUM SUCC 40 MG IJ SOLR
40.0000 mg | Freq: Once | INTRAMUSCULAR | Status: AC
  Administered 2021-02-23: 40 mg via INTRAVENOUS

## 2021-02-23 MED ORDER — SODIUM CHLORIDE 0.9% FLUSH
10.0000 mL | INTRAVENOUS | Status: DC | PRN
  Filled 2021-02-23: qty 10

## 2021-02-23 NOTE — Progress Notes (Signed)
Orders placed for 2 units of blood today. Blood bank contacted and verified orders.

## 2021-02-23 NOTE — Telephone Encounter (Signed)
-----   Message from York Ram, RN sent at 02/21/2021 12:33 PM EDT ----- I was able to re-add him in [place], but I am not sure if a transmission is needed before we can schedule future remotes.  Can you help with this?  ----- Message ----- From: Ricci Barker, RN Sent: 02/21/2021  10:47 AM EDT To: [facility]  The patient will need to have his loop monitored monthly again for afib, please. He is turning his monitor on at home. We did a download today. Thanks

## 2021-02-23 NOTE — Telephone Encounter (Signed)
Mandy from [facility] called checking on clearance for the pt. I assured Leafy Ro that I will have the pre op provider review Dr. Sallyanne Kuster ov note 02/21/21 for clearance.   I d/w pre op provider Marrianne Mood, [facility] to please review Dr. Sallyanne Kuster ov note 02/21/21 for clearance. In reviewing the ov notes Dr. Sallyanne Kuster has ordered for the pt to have a loop recorder to monitor for a-fib. Dr. Sallyanne Kuster did stop Eliquis at this time as pt Hgb is 6.8. Please see note from pre op provider Jacquelyn V. PAC today. Looks like we are waiting on some downloads from the loop recorder to see if there is any a-fib. I will send FYI to surgeon's office with the notes from today.     Note from pre op provider today Marrianne Mood, [facility]. OK - so she says they are restarting his downloads. Looks like he is going to get a transfusion for a hemoglobin of 6.8 so I highly doubt surgery is going to be cleared soon at all..... We will need to wait to see if Afib first... and hopefully, he won't have any, because I doubt Dr. Loletha Grayer is going to recommend anticoagulation with a hemoglobin of 6!

## 2021-02-23 NOTE — Patient Instructions (Signed)
Blood Transfusion, Adult, Care After This sheet gives you information about how to care for yourself after your procedure. Your doctor may also give you more specific instructions. If youhave problems or questions, contact your doctor. What can I expect after the procedure? After the procedure, it is common to have: Bruising and soreness at the IV site. A fever or chills on the day of the procedure. This may be your body's response to the new blood cells received. A headache. Follow these instructions at home: Insertion site care     Follow instructions from your doctor about how to take care of your insertion site. This is where an IV tube was put into your vein. Make sure you: Wash your hands with soap and water before and after you change your bandage (dressing). If you cannot use soap and water, use hand sanitizer. Change your bandage as told by your doctor. Check your insertion site every day for signs of infection. Check for: Redness, swelling, or pain. Bleeding from the site. Warmth. Pus or a bad smell. General instructions Take over-the-counter and prescription medicines only as told by your doctor. Rest as told by your doctor. Go back to your normal activities as told by your doctor. Keep all follow-up visits as told by your doctor. This is important. Contact a doctor if: You have itching or red, swollen areas of skin (hives). You feel worried or nervous (anxious). You feel weak after doing your normal activities. You have redness, swelling, warmth, or pain around the insertion site. You have blood coming from the insertion site, and the blood does not stop with pressure. You have pus or a bad smell coming from the insertion site. Get help right away if: You have signs of a serious reaction. This may be coming from an allergy or the body's defense system (immune system). Signs include: Trouble breathing or shortness of breath. Swelling of the face or feeling warm  (flushed). Fever or chills. Head, chest, or back pain. Dark pee (urine) or blood in the pee. Widespread rash. Fast heartbeat. Feeling dizzy or light-headed. You may receive your blood transfusion in an outpatient setting. If so, youwill be told whom to contact to report any reactions. These symptoms may be an emergency. Do not wait to see if the symptoms will go away. Get medical help right away. Call your local emergency services (911 in the [place] Do not drive yourself to the hospital. Summary Bruising and soreness at the IV site are common. Check your insertion site every day for signs of infection. Rest as told by your doctor. Go back to your normal activities as told by your doctor. Get help right away if you have signs of a serious reaction. This information is not intended to replace advice given to you by your health care provider. Make sure you discuss any questions you have with your healthcare provider. Document Revised: 01/16/2019 Document Reviewed: 01/16/2019 Elsevier Patient Education  2022 Elsevier Inc.  

## 2021-02-23 NOTE — Progress Notes (Signed)
CRITICAL VALUE STICKER  CRITICAL VALUE: Hgb 6.8  DATE & TIME NOTIFIED: 02/23/21, 11:46  MD NOTIFIED: Irene Limbo  TIME OF NOTIFICATION: 11:47  RESPONSE:  Pt getting 2 units of blood today.

## 2021-02-23 NOTE — Telephone Encounter (Signed)
I attempted to get the patient monitor serial number so I can put the monitor back in [employer] to get his transmissions. The patient nor his wife had their hearing aides in and state they will give me a call back.

## 2021-02-23 NOTE — Progress Notes (Signed)
Nutrition Follow-up:  Patient with large granular lymphocytic leukemia, transfusion dependent anemia.   Met with patient in infusion, he is receiving 2 units of blood today. Patient reports appetite is "too good" and eating well. Yesterday he had an English muffin and yogurt for breakfast, unable to recall lunch meal, ate "veggie dinner" made by his girlfriend (squash, okra, onions) states it was delicious. Patient reports drinking 3 Ensure supplements/day, says he automatically receives a discount when purchasing theses at [employer] and does not need any coupons today. He reports ongoing weakness, continues to work with PT 3 times/week.    Medications: reviewed  Labs: Glucose 223, Cr 0.44, Hgb 6.8  Anthropometrics: Weight 160 lb 12.8 oz today stable   5/26 - 161 lb 13.1 oz    NUTRITION DIAGNOSIS: Unintentional weight loss stable   INTERVENTION:  Continue eating high calorie, high protein foods for weight maintenance Continue drinking 3 Ensure Plus/day for added calories and protein Patient politely declined Ensure coupons today Patient has contact information    MONITORING, EVALUATION, GOAL: weight trends, intake   NEXT VISIT: To be scheduled with treatment as needed

## 2021-02-24 ENCOUNTER — Ambulatory Visit (HOSPITAL_COMMUNITY)
Admission: RE | Admit: 2021-02-24 | Discharge: 2021-02-24 | Disposition: A | Discharge status: Home / Self Care | Payer: Medicare HMO | Source: Ambulatory Visit | Attending: Neurology | Admitting: Neurology

## 2021-02-24 DIAGNOSIS — G459 Transient cerebral ischemic attack, unspecified: Secondary | ICD-10-CM | POA: Diagnosis not present

## 2021-02-24 DIAGNOSIS — Z6825 Body mass index (BMI) 25.0-25.9, adult: Secondary | ICD-10-CM | POA: Diagnosis not present

## 2021-02-24 DIAGNOSIS — Z87440 Personal history of urinary (tract) infections: Secondary | ICD-10-CM | POA: Diagnosis not present

## 2021-02-24 DIAGNOSIS — Z794 Long term (current) use of insulin: Secondary | ICD-10-CM | POA: Diagnosis not present

## 2021-02-24 DIAGNOSIS — C91Z0 Other lymphoid leukemia not having achieved remission: Secondary | ICD-10-CM | POA: Diagnosis not present

## 2021-02-24 DIAGNOSIS — I6522 Occlusion and stenosis of left carotid artery: Secondary | ICD-10-CM | POA: Diagnosis not present

## 2021-02-24 DIAGNOSIS — D472 Monoclonal gammopathy: Secondary | ICD-10-CM | POA: Diagnosis not present

## 2021-02-24 DIAGNOSIS — K529 Noninfective gastroenteritis and colitis, unspecified: Secondary | ICD-10-CM | POA: Diagnosis not present

## 2021-02-24 DIAGNOSIS — E1140 Type 2 diabetes mellitus with diabetic neuropathy, unspecified: Secondary | ICD-10-CM | POA: Diagnosis not present

## 2021-02-24 DIAGNOSIS — R197 Diarrhea, unspecified: Secondary | ICD-10-CM | POA: Diagnosis not present

## 2021-02-24 LAB — TYPE AND SCREEN
ABO/RH(D): O POS
Antibody Screen: NEGATIVE
Unit division: 0
Unit division: 0

## 2021-02-24 LAB — BPAM RBC
Blood Product Expiration Date: 202208232359
Blood Product Expiration Date: 202208232359
ISSUE DATE / TIME: 202207201258
ISSUE DATE / TIME: 202207201258
Unit Type and Rh: 5100
Unit Type and Rh: 5100

## 2021-02-24 IMAGING — MR MR MRA NECK WO/W CM
6 of 8 series · 33 of 48 positions shown · IV contrast ([ID] GADAVIST)
Comparison: None.

CLINICAL DATA: Neuro deficit, acute stroke suspected.

EXAM:
MRA NECK WITHOUT AND WITH CONTRAST
MRA HEAD WITHOUT CONTRAST
TECHNIQUE: Multiplanar and multiecho pulse sequences of the neck were obtained
without and with intravenous contrast. Angiographic images of the
neck were obtained using MRA technique without and with intravenous
contrast; Angiographic images of the Circle of Willis were obtained
using MRA technique without intravenous contrast.
CONTRAST:  7.5mL GADAVIST GADOBUTROL 1 MMOL/ML IV SOLN

[Series 3: tof_2d_tra · axial · 3.5mm · 0.43mm/px · z∈[-263,-95]mm · 5 of 70 slices shown]
[im 1/70]
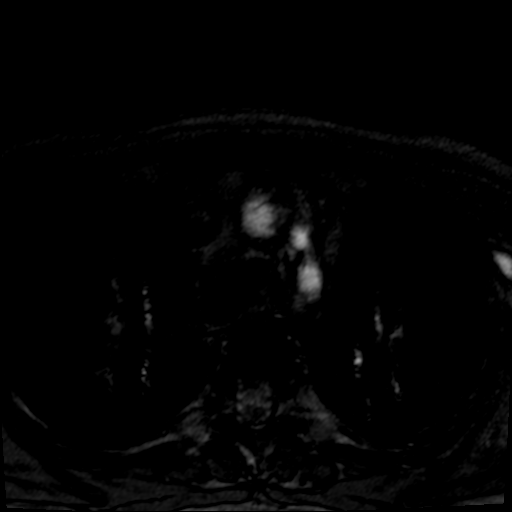
[im 18/70]
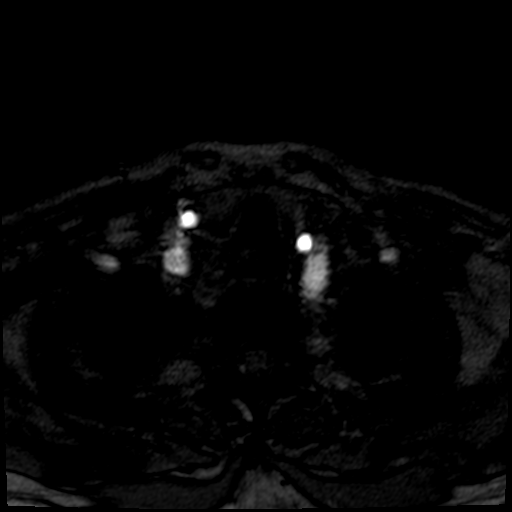
[im 35/70]
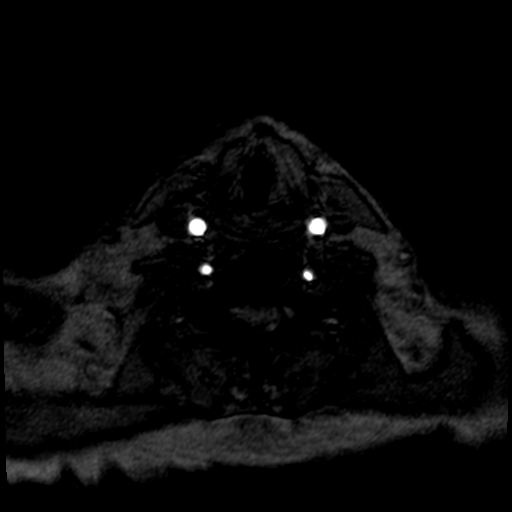
[im 52/70]
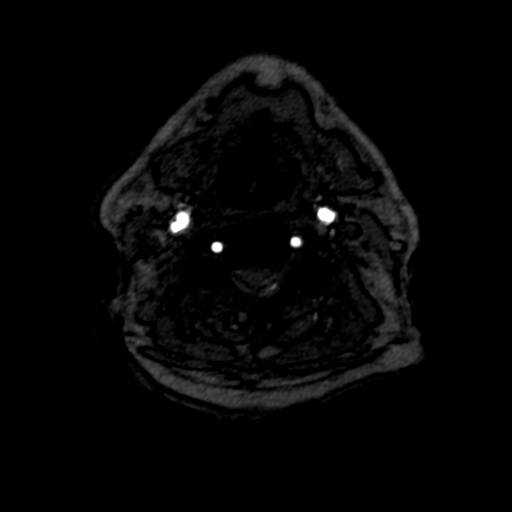
[im 70/70]
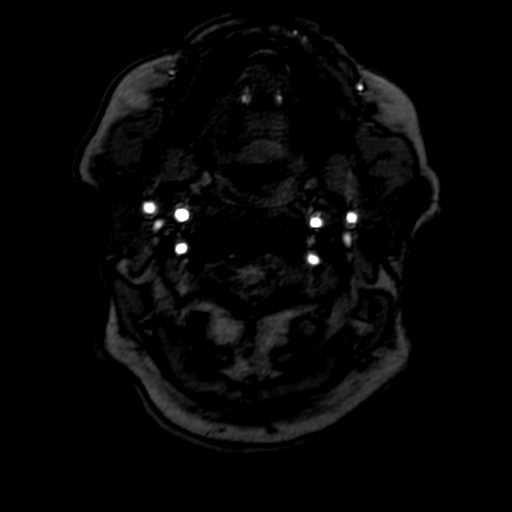

[Series 6: tof_2d_tra_mip_tra · axial · 172.6mm · 0.43mm/px · 1 of 1 slices shown]
[im 1/1]
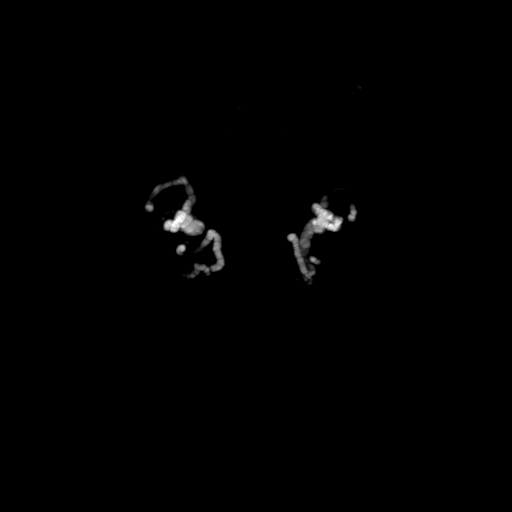

[Series 7: (id)_cor_pre · coronal · 0.8mm · 0.78mm/px · 7 of 96 slices shown]
[im 1/96]
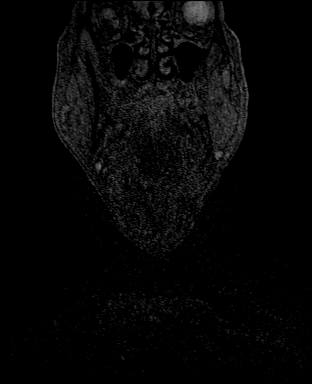
[im 16/96]
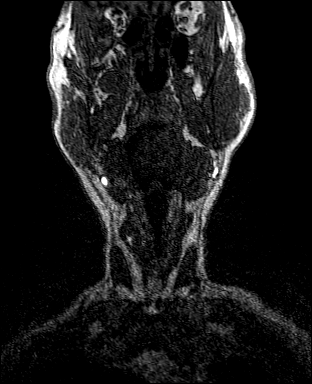
[im 32/96]
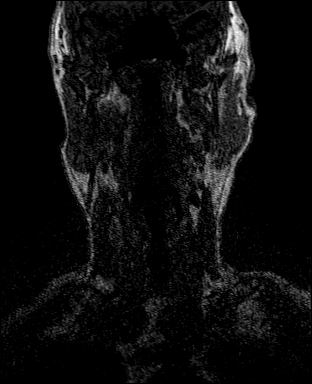
[im 48/96]
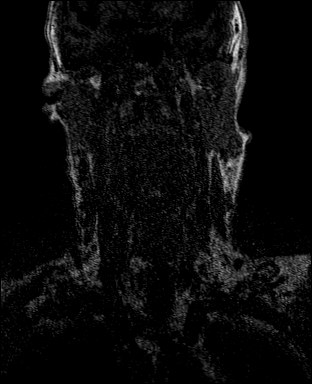
[im 64/96]
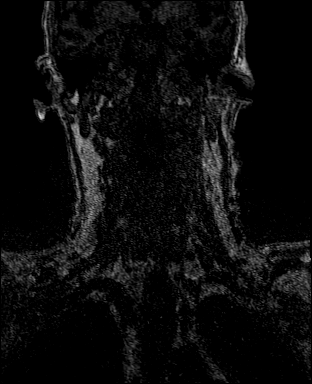
[im 80/96]
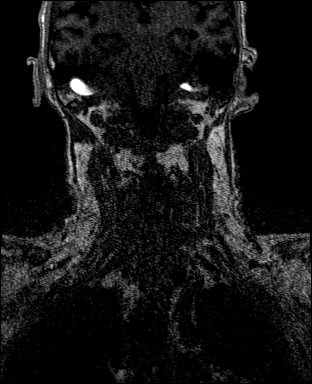
[im 96/96]
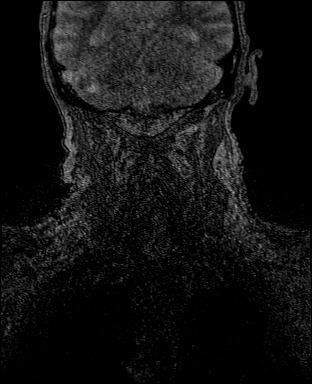

[Series 9: (id)_cor_post · coronal · 0.8mm · 0.78mm/px · 8 of 96 slices shown]
[im 1/96]
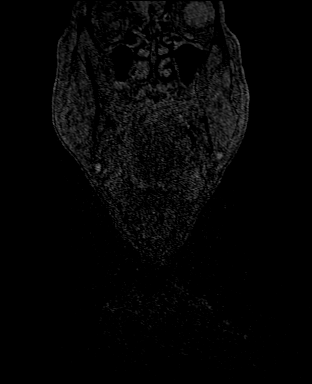
[im 14/96]
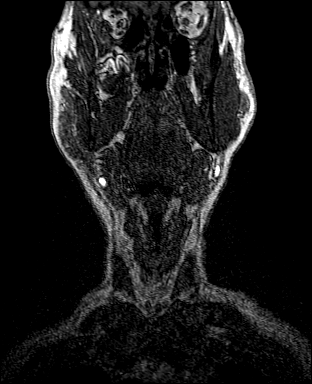
[im 28/96]
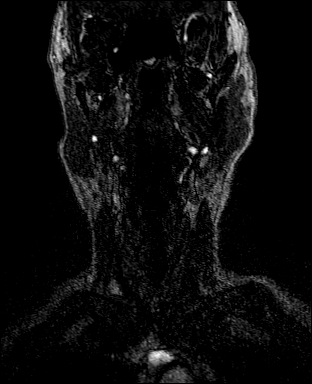
[im 41/96]
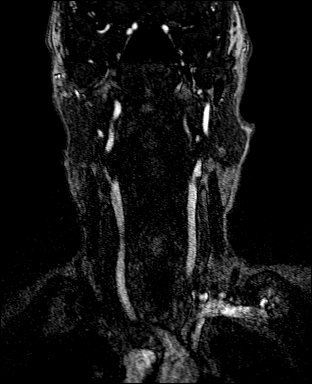
[im 55/96]
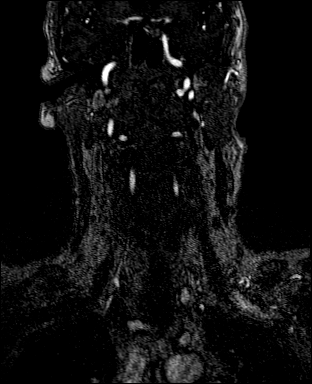
[im 68/96]
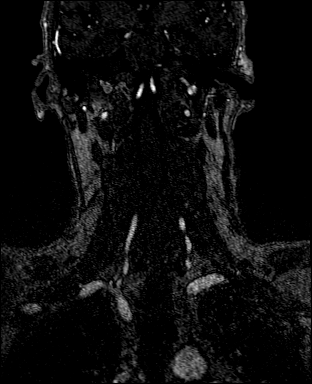
[im 82/96]
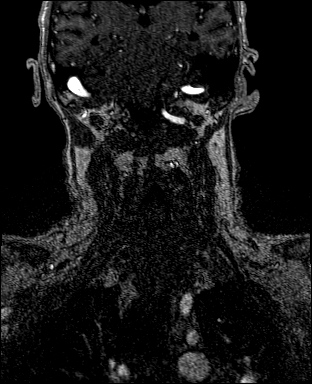
[im 96/96]
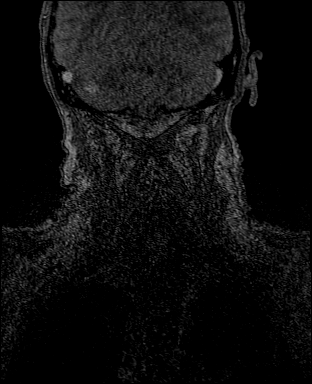

[Series 10: (id)_cor_post_sub · coronal · 0.8mm · 0.78mm/px · 8 of 94 slices shown]
[im 1/94]
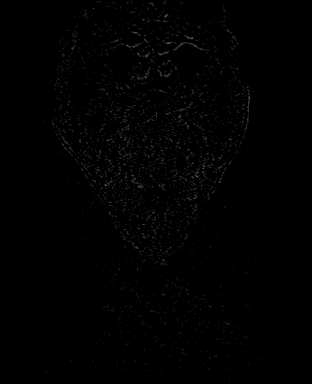
[im 14/94]
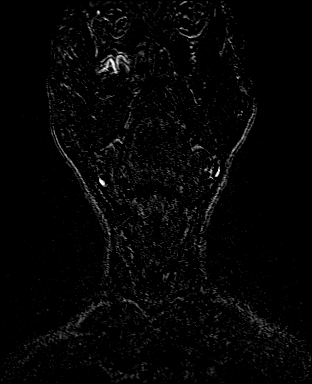
[im 27/94]
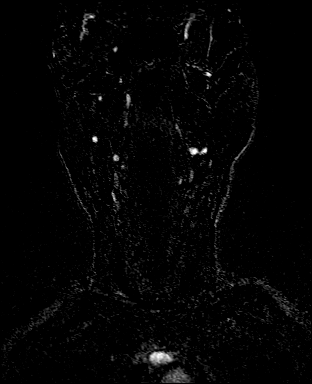
[im 40/94]
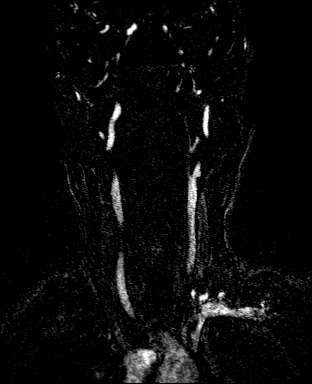
[im 54/94]
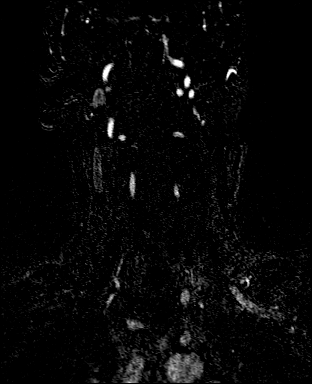
[im 67/94]
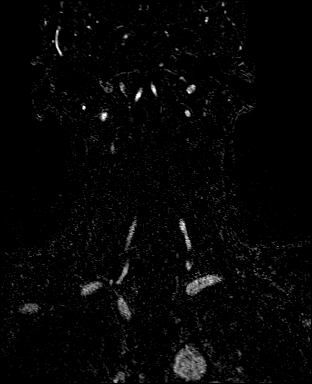
[im 80/94]
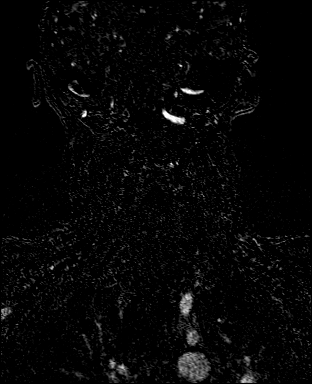
[im 94/94]
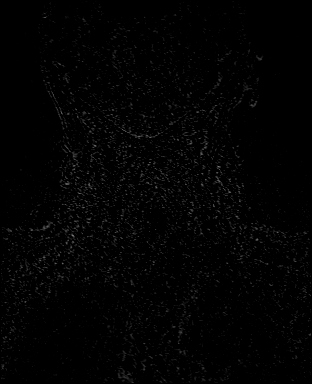

[Series 12: (id)_cor_post_venous · coronal · 0.8mm · 0.78mm/px · 4 of 96 slices shown]
[im 1/96]
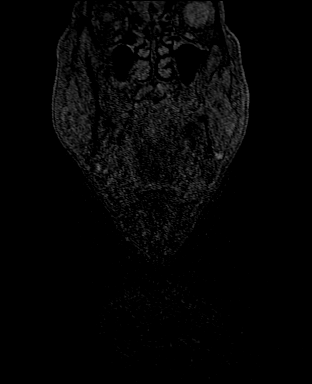
[im 14/96]
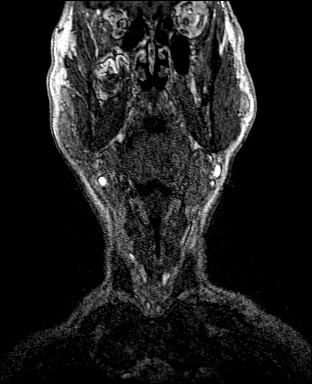
[im 28/96]
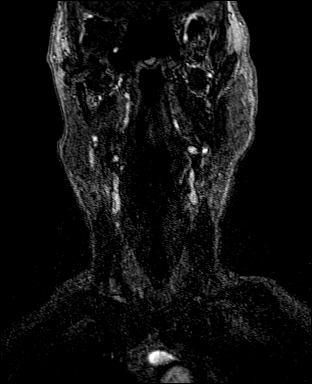
[im 41/96]
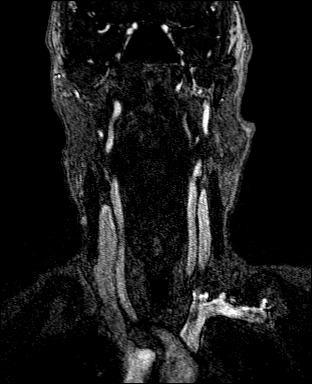

[33 of 48 positions shown; findings below may reference images not displayed]

FINDINGS: MRA NECK FINDINGS

Aorta: Great vessel origins appear patent on postcontrast imaging.

Carotid system: Right common carotid artery and internal carotid
arteries are patent without evidence of significant stenosis. Left
common carotid artery is patent. Moderate stenosis of the left
proximal internal carotid artery at its origin.

Vertebral arteries: Codominant. Likely moderate left vertebral
artery origin stenosis. The remainder of the vertebral arteries are
patent bilaterally not additional significant stenosis.

MRA HEAD FINDINGS

Anterior circulation: Bilateral intracranial ICAs, MCAs, and ACAs
are patent without proximal hemodynamically significant stenosis.

Posterior circulation: Bilateral intradural vertebral arteries,
basilar artery, and posterior cerebral arteries are patent. Possibly
severe left P2 PCA stenosis.
IMPRESSION: MRA Head:

1. No large vessel occlusion.
2. Possibly severe left P2 PCA stenosis.

MRA Neck:

1. Moderate left ICA origin stenosis.
2. Likely moderate left vertebral artery origin stenosis.

## 2021-02-24 IMAGING — MR MR MRA HEAD W/O CM
2 series · 20 of 48 positions shown · IV contrast (gadavist)
Comparison: None.

CLINICAL DATA: Neuro deficit, acute stroke suspected.

EXAM:
MRA NECK WITHOUT AND WITH CONTRAST
MRA HEAD WITHOUT CONTRAST
TECHNIQUE: Multiplanar and multiecho pulse sequences of the neck were obtained
without and with intravenous contrast. Angiographic images of the
neck were obtained using MRA technique without and with intravenous
contrast; Angiographic images of the Circle of Willis were obtained
using MRA technique without intravenous contrast.
CONTRAST:  7.5mL GADAVIST GADOBUTROL 1 MMOL/ML IV SOLN

[Series 5: vessel_scout_head_msum · sagittal · 6.0mm · 0.59mm/px · 5 of 21 slices shown]
[im 1/21]
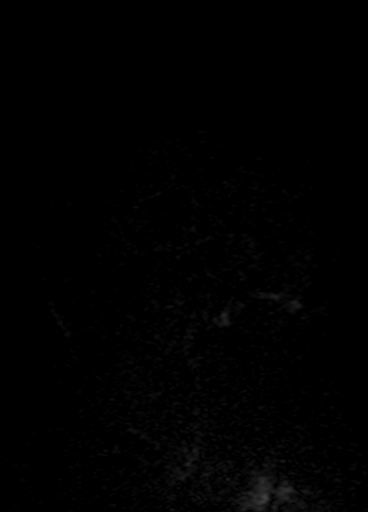
[im 6/21]
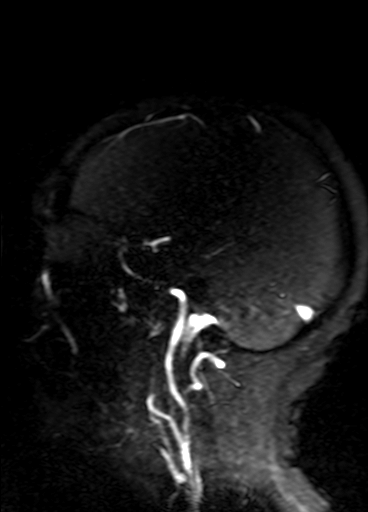
[im 11/21]
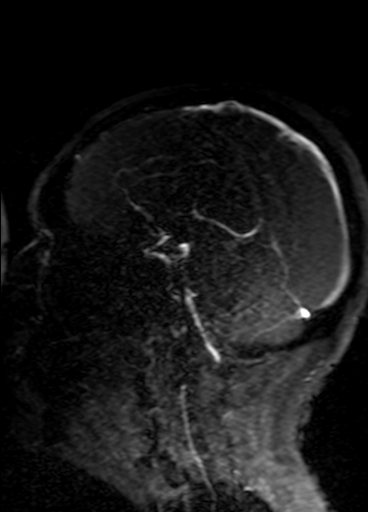
[im 16/21]
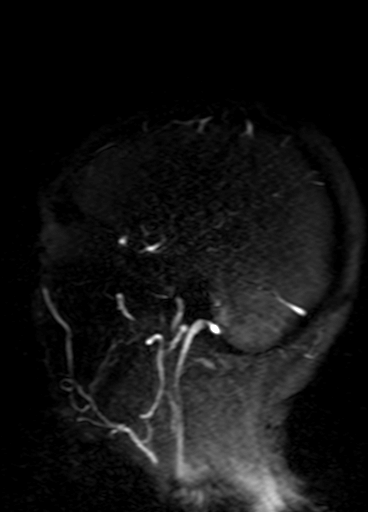
[im 21/21]
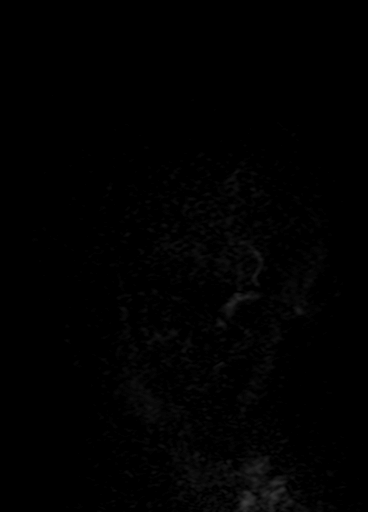

[Series 8: TOF · axial · 0.6mm · 0.35mm/px · z∈[-43,+51]mm · 15 of 172 slices shown]
[im 1/172]
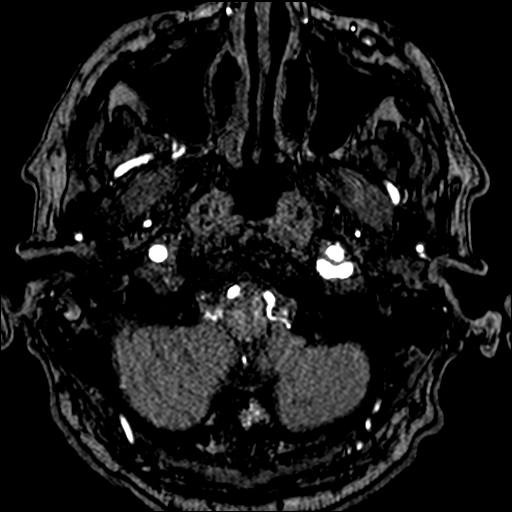
[im 5/172]
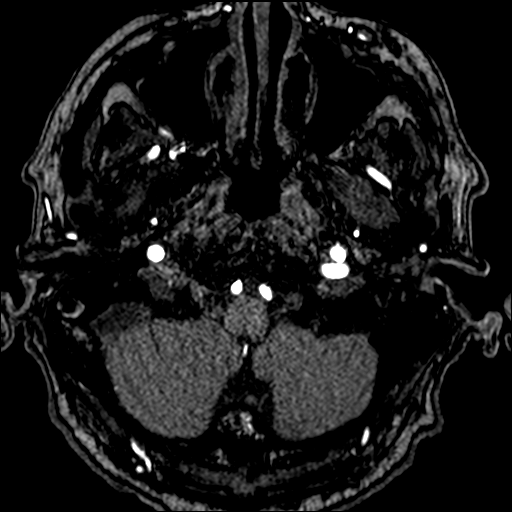
[im 9/172]
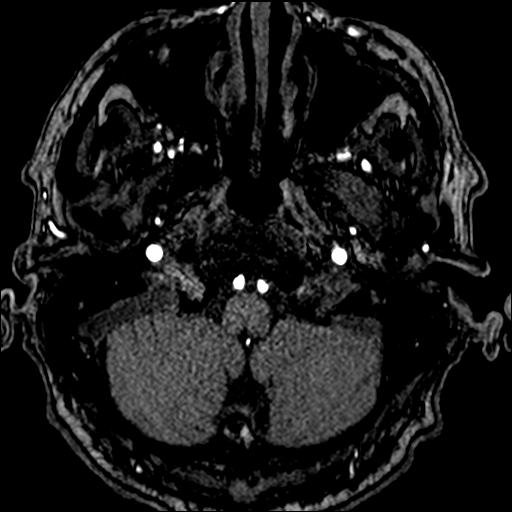
[im 13/172]
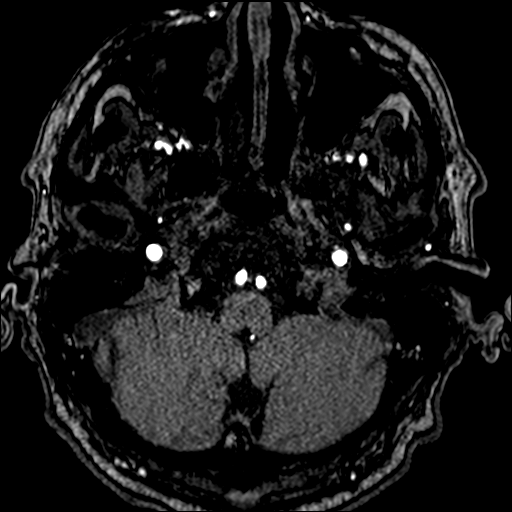
[im 17/172]
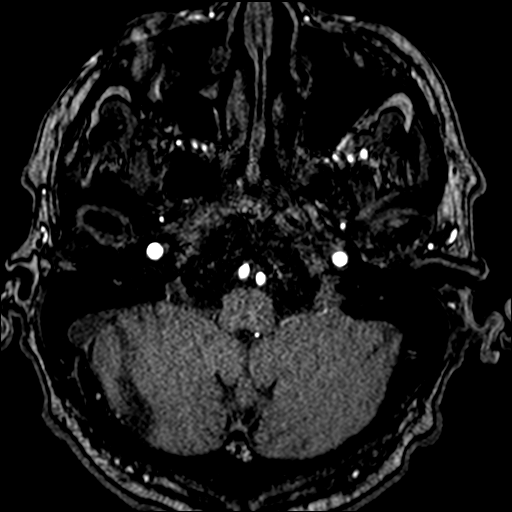
[im 29/172]
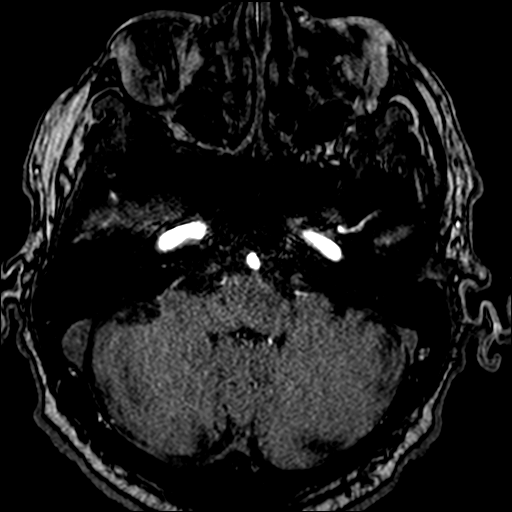
[im 33/172]
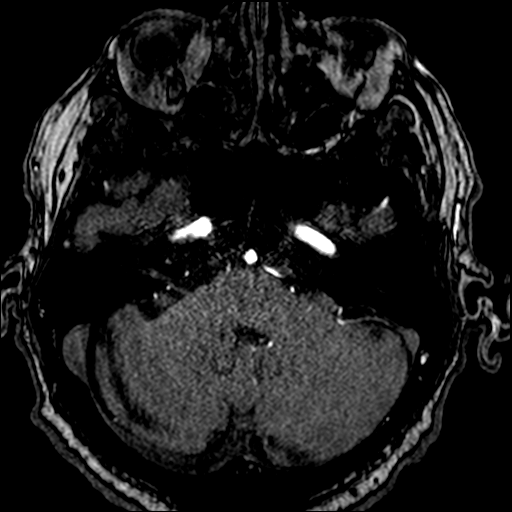
[im 53/172]
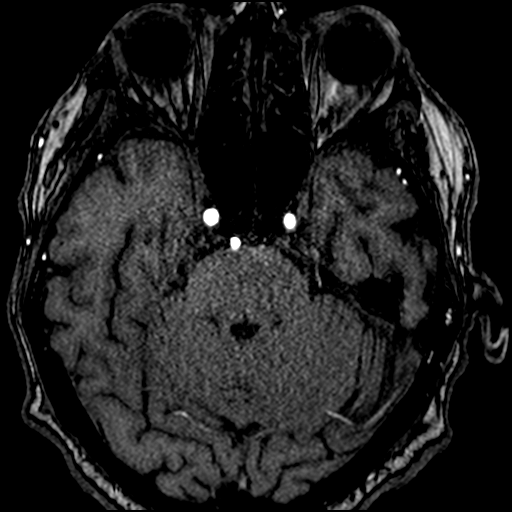
[im 74/172]
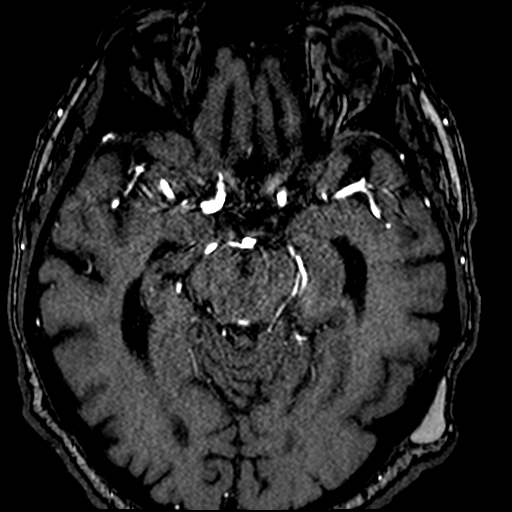
[im 86/172]
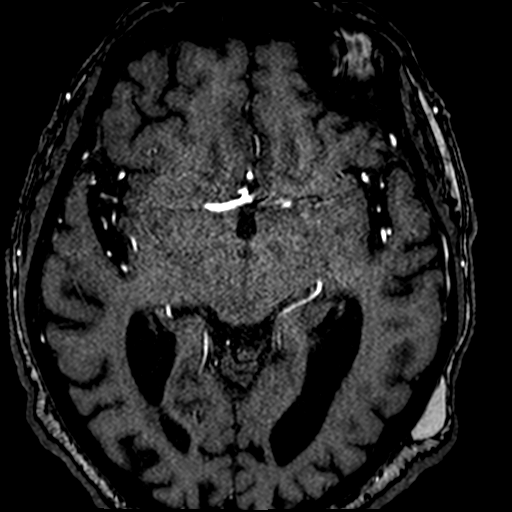
[im 98/172]
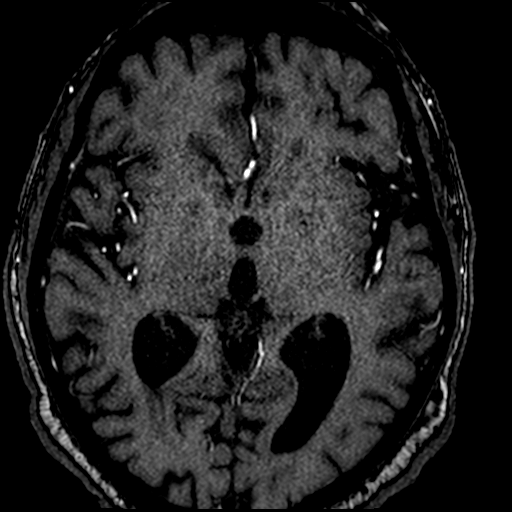
[im 119/172]
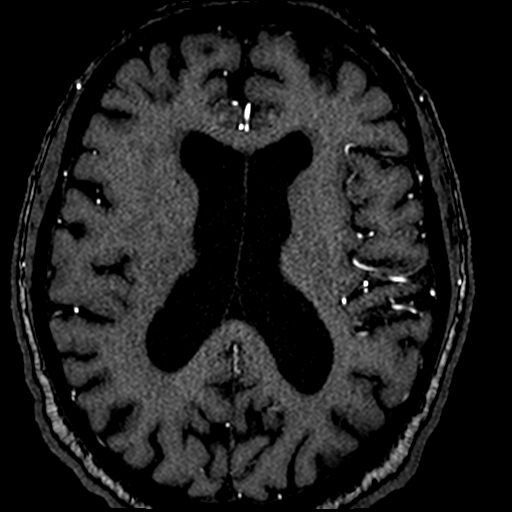
[im 139/172]
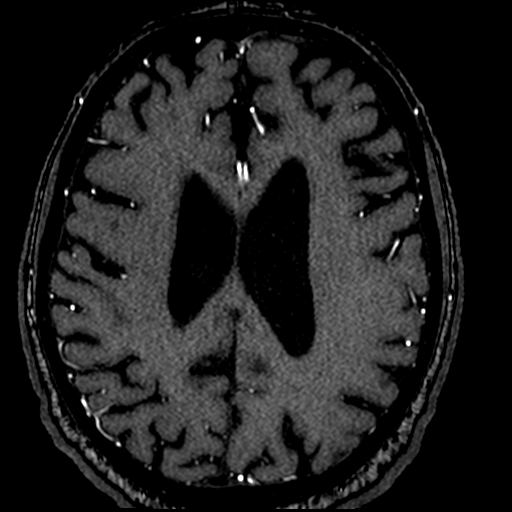
[im 143/172]
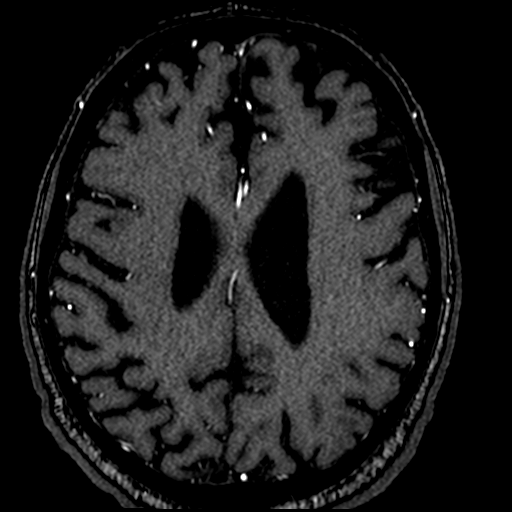
[im 163/172]
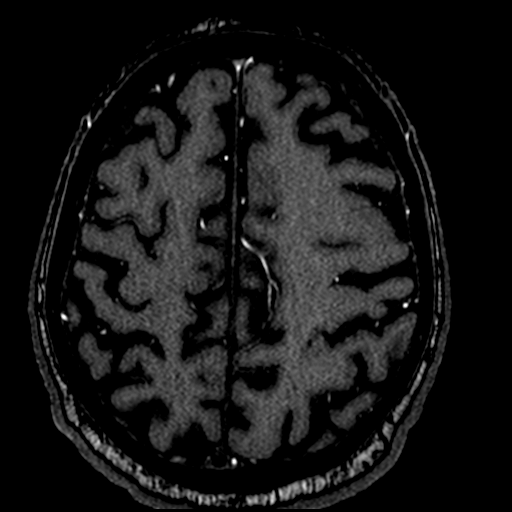

[20 of 48 positions shown; findings below may reference images not displayed]

FINDINGS: MRA NECK FINDINGS

Aorta: Great vessel origins appear patent on postcontrast imaging.

Carotid system: Right common carotid artery and internal carotid
arteries are patent without evidence of significant stenosis. Left
common carotid artery is patent. Moderate stenosis of the left
proximal internal carotid artery at its origin.

Vertebral arteries: Codominant. Likely moderate left vertebral
artery origin stenosis. The remainder of the vertebral arteries are
patent bilaterally not additional significant stenosis.

MRA HEAD FINDINGS

Anterior circulation: Bilateral intracranial ICAs, MCAs, and ACAs
are patent without proximal hemodynamically significant stenosis.

Posterior circulation: Bilateral intradural vertebral arteries,
basilar artery, and posterior cerebral arteries are patent. Possibly
severe left P2 PCA stenosis.
IMPRESSION: MRA Head:

1. No large vessel occlusion.
2. Possibly severe left P2 PCA stenosis.

MRA Neck:

1. Moderate left ICA origin stenosis.
2. Likely moderate left vertebral artery origin stenosis.

## 2021-02-24 MED ORDER — GADOBUTROL 1 MMOL/ML IV SOLN
7.5000 mL | Freq: Once | INTRAVENOUS | Status: AC | PRN
  Administered 2021-02-24: 7.5 mL via INTRAVENOUS

## 2021-02-24 NOTE — Telephone Encounter (Signed)
I spoke with the patient and his monitor serial number is [id number].

## 2021-02-26 ENCOUNTER — Telehealth: Payer: Self-pay | Admitting: Neurology

## 2021-02-26 LAB — COMPREHENSIVE METABOLIC PANEL
ALT: 64 IU/L — ABNORMAL HIGH (ref 0–44)
AST: 30 IU/L (ref 0–40)
Albumin/Globulin Ratio: 1.7 (ref 1.2–2.2)
Albumin: 3.5 g/dL — ABNORMAL LOW (ref 3.7–4.7)
Alkaline Phosphatase: 86 IU/L (ref 44–121)
BUN/Creatinine Ratio: 32 — ABNORMAL HIGH (ref 10–24)
BUN: 18 mg/dL (ref 8–27)
Bilirubin Total: 0.8 mg/dL (ref 0.0–1.2)
CO2: 26 mmol/L (ref 20–29)
Calcium: 8.9 mg/dL (ref 8.6–10.2)
Chloride: 99 mmol/L (ref 96–106)
Creatinine, Ser: 0.56 mg/dL — ABNORMAL LOW (ref 0.76–1.27)
Globulin, Total: 2.1 g/dL (ref 1.5–4.5)
Glucose: 145 mg/dL — ABNORMAL HIGH (ref 65–99)
Potassium: 4.3 mmol/L (ref 3.5–5.2)
Sodium: 136 mmol/L (ref 134–144)
Total Protein: 5.6 g/dL — ABNORMAL LOW (ref 6.0–8.5)
eGFR: 100 mL/min/{1.73_m2} (ref >59)

## 2021-02-26 LAB — TSH: TSH: 1.06 u[IU]/mL (ref 0.450–4.500)

## 2021-02-26 NOTE — Telephone Encounter (Signed)
I called the patient.  MRA of the head and neck shows no severe stenosis in the neck but there is significant stenosis of the left P2 segment.  I suppose this could explain some of the right-sided episodes that the patient has complained of.  The patient apparently was taken off of Eliquis, I have instructed him to start aspirin 325 mg daily.  He will follow-up in November.   MRA head and neck 02/25/21:  IMPRESSION: MRA Head:   1. No large vessel occlusion. 2. Possibly severe left P2 PCA stenosis.  MRA Neck:   1. Moderate left ICA origin stenosis. 2. Likely moderate left vertebral artery origin stenosis.

## 2021-02-28 ENCOUNTER — Telehealth: Payer: Self-pay | Admitting: Cardiovascular Disease

## 2021-02-28 MED ORDER — FUROSEMIDE 20 MG PO TABS: ORAL_TABLET | ORAL | 3 refills | Status: DC

## 2021-02-28 NOTE — Telephone Encounter (Signed)
Pt c/o swelling: STAT is pt has developed SOB within 24 hours  If swelling, where is the swelling located? Feet up to his knees  How much weight have you gained and in what time span? 149 lbs - 163 lbs in 6 weeks   Have you gained 3 pounds in a day or 5 pounds in a week? 5 lbs in a week   Do you have a log of your daily weights (if so, list)? No   Are you currently taking a fluid pill? Yes  Are you currently SOB? No   Have you traveled recently? No   States this began occurring when Lasix was decreased from 40 MG's to 20 MG's

## 2021-02-28 NOTE — Telephone Encounter (Signed)
Returned the call to [place], Therapist, sports with home health. She stated that the patient has had an increase in weight from 149-163 pounds in the 6 weeks that she has been seeing him. Five of those pounds has been in the past week. The patient stated that he had his Furosemide decreased from 40 to 20 mg recently. The nurse stated that she feels he should probably be on 40 mg once daily and not 20 mg. The patient seems to be confused as to what dosage he should be on and what dosage he had been taking.  The nurse stated that someone from Dr. Pennie Banter office stated that the patient should take Furosemide 40 mg for the next two days and then decrease it back to 20 mg plus follow up with cardiology.   The patient will be discharged from home nursing today. The nurse stated that the patient denies shortness of breath.

## 2021-02-28 NOTE — Telephone Encounter (Signed)
Tommy Peterson has been called and made aware. She will call the patient. She will also get the patient's home care extended.    Per Dr. Sallyanne Kuster: Please have him weigh daily  - furosemide 40 mg daily if weight is 159 lb or higher  - furosemide 20 mg daily if weight is less than 159 lb

## 2021-03-01 ENCOUNTER — Telehealth: Payer: Self-pay | Admitting: Cardiovascular Disease

## 2021-03-01 ENCOUNTER — Other Ambulatory Visit: Payer: Self-pay | Admitting: *Deleted

## 2021-03-01 DIAGNOSIS — Z79899 Other long term (current) drug therapy: Secondary | ICD-10-CM

## 2021-03-01 DIAGNOSIS — I480 Paroxysmal atrial fibrillation: Secondary | ICD-10-CM

## 2021-03-01 DIAGNOSIS — Z5181 Encounter for therapeutic drug level monitoring: Secondary | ICD-10-CM

## 2021-03-01 NOTE — Telephone Encounter (Signed)
Done in error.

## 2021-03-04 ENCOUNTER — Telehealth: Payer: Self-pay | Admitting: Cardiovascular Disease

## 2021-03-04 NOTE — Telephone Encounter (Signed)
    Pt c/o BP issue: STAT if pt c/o blurred vision, one-sided weakness or slurred speech  1. What are your last 5 BP readings? 176/96 HR 61  2. Are you having any other symptoms (ex. Dizziness, headache, blurred vision, passed out)?   3. What is your BP issue? Pt gave his BP reading, he also provided blood sugar 241 and he said he gained weight from 150 lbs to 164 lbs in less than a week.

## 2021-03-04 NOTE — Telephone Encounter (Signed)
Spoke with the patient. He stated that his weight this morning was 164.8lbs which is a 14 pound weight gain in two weeks. He has been taking the furosemide 40 mg once daily for the past two days (today is day 3) with no changes. Normally he takes 20 mg once daily for a weight under 160. He denies shortness of breath and was not audibly short of breath on the phone.  He checks his blood pressure daily but was currently out running errands.  Once he gets home he will check his blood pressure and sugar. He stated that his sugar has been running from 69-500. He has been advised to call [facility] , Dr. Cassell Clement, as soon as he gets home to discuss his levels. He stated that this is who monitors his diabetes.  The patient will call back with readings and an update

## 2021-03-04 NOTE — Telephone Encounter (Signed)
Pt c/o swelling: STAT is pt has developed SOB within 24 hours  If swelling, where is the swelling located? Both ankles.. the right is more severe than the left.. top of the feet and below the ankle  How much weight have you gained and in what time span? Over 10 pounds in a week   Have you gained 3 pounds in a day or 5 pounds in a week? yes  Do you have a log of your daily weights (if so, list)? no  Are you currently taking a fluid pill? yes  Are you currently SOB? no  Have you traveled recently? no

## 2021-03-04 NOTE — Telephone Encounter (Signed)
Returned call to pr relayed Dr [employer] message and to continue to take daily weights and daily BP for future review. Verbalizes understanding. He will CB our office with any issues. He will go to ER if warranted.

## 2021-03-08 ENCOUNTER — Encounter: Payer: Self-pay | Admitting: Hematology

## 2021-03-08 NOTE — Progress Notes (Signed)
HEMATOLOGY/ONCOLOGY CLINIC NOTE  Date of Service: .03/09/2021  Patient Care Team: Tommy Reichmann, DO as PCP - General (Family Medicine) Tommy, Tommy Hora, MD as PCP - Cardiology (Cardiology) Tommy Lobo, MD as Referring Physician (Hematology and Oncology) Tommy Reichmann, DO as Referring Physician (Family Medicine)  CHIEF COMPLAINTS/PURPOSE OF CONSULTATION:  Large Granular Lymphocytic Leukemia  Hem/Onc History  1)- chronic leukocytosis (neutrophilia and absolute lymphocytosis), peripheral blood lymphocytes about 5000, about 1/4 of cells have LGL morphology 2)- macrocytic anemia at least since 09/08/2013 (normal CBC on 11/28/2012), MCV now as high as 115, low retics, marrow M:E ratio 15:1 in 05/2016, erythroid hypoplasia 3)- MGUS serum IgG lambda 0.4 g/dL in 59/2706, follow annually 4)- 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 weekly -03/29/2017 increase dose 25 mg po qweek -07/19/2017 increase mtx 30 mg po qweek, stable dose since then   HISTORY OF PRESENTING ILLNESS:  Tommy Tommy is a wonderful 80 y.o. male who has been referred to [place] by Tommy Tommy for evaluation and management of Large Granular Lymphocytic Leukemia. The pt reports that he is doing well overall.   Pt is here as he is moving his care because his previous Oncologist, Tommy. Maren Peterson, has changed practices. The pt reports that he needed 2 pints of blood every 2 weeks for a year beginning in 2014. Pt has never needed to any  erythropoeitin injections. His Hgb has been holding higher lately, around 10-11. It has been about 7 months since pt has needed his last blood transfusion. Pt has been taking 30 mg of Methotrexate once per week and denies any issues with that dosage. He has continued taking 1 mg Folic Acid once per day. He is also taking a daily multivitamin.    Pt was previously walking a lot but had to slow down due to fatigue. Pt will need a surgery to repair his left inguinal hernia.    In 2017 pt had a C. Diff infection. Pt had recurrent UTI infections in 2019. His last hospitalization for infections was in January 2020. There were no kidney stones or other reasons for repeat infections. Pt denies any pneumonia infections. Pt has had Diabetes for 40+ years and it is currently well controlled. Pt was never given a direct reason for chronic diarrhea but was placed on Budesonide which stopped his symptoms. Pt is currently taking 3 mg twice per week. He notes that he has been experiencing some urinary frequency, but does feel like he is completely emptying his bladder when he uses the restroom. Pt has seen a Urologist who gave him some medication that caused him to urinate up to 30 times per day. He has tried Flomax previously, and still has some at home, but notes that it does not help much. Pt is following with a Dermatologist , Tommy. Jeanie Peterson, for his skin Squamous Cell Carcinoma. He is currently using Efudex to treat. He was having some night sweats about a year ago, but has had none recently.    Most recent lab results (08/28/2019) of CBC w/diff  and CMP is as follows: all values are WNL except for Hgb at 11.8, HCT at 35.1, MCV at 122, MCH at 41.1, RDW at 2.87, RDW at 16.0, Abs Immature Granulocyte at 0.11K, Immature Granulocyte Rel at 1.2, Mono Abs at 1.0K, Creatinine at 0.5, Glucose at 199.   On review of systems, pt reports constipation, fatigue and denies diarrhea, unexpected weight loss, fevers, chills, night  sweats, abdominal pain, leg swelling and any other symptoms.    On PMHx the pt reports BPH, Chronic Diarrhea, Chronic Large Granular Lymphocytic Leukemia, Collagenous Collitis, Clostridium defficile colitis, Chronic UTIs, Transfusion-dependant anemia, Type 2 diabetes mellitus.  INTERVAL HISTORY:   Tommy Tommy is a wonderful 80 y.o. male who is here for evaluation and management of Large Granular Lymphocytic Leukemia. The patient's last visit with [place] was on 12/23/2020. The pt reports that he is doing well overall.  The pt reports he has seen GI at [facility] Tommy Tommy --rx with budesonide diarrhea better. Endocrinologist -- mz of diabetes since his Blood sugar levels were>500 RLE swelling for 6-7days   Lab results today 03/09/2021 of CBC w/diff hgb 8, wbc count 12.4k and platelets 281k.Marland Kitchen lymphocyte counts have increased to 6.9 CMP - stable.  On review of systems, pt reports  no fevers/chills/night sweats. Accompanied by his daughter. Discussed gradual progression of his T-LGL and potential treatment options.   SURGICAL HISTORY: Past Surgical History:  Procedure Laterality Date   BONE MARROW BIOPSY Right 05-24-2016;  08-20-2014   CARDIOVASCULAR STRESS TEST  11-26-2012   Tommy Tommy Tommy  @ [place] ([place]   normal nuclear study w/ no ischemia/  normal LV function and wall motion , ef 60% (find in care everywhere,epic)   CARDIOVERSION N/A 08/31/2020   Procedure: CARDIOVERSION;  Surgeon: Tommy Klein, MD;  Location: [facility];  Service: Cardiovascular;  Laterality: N/A;   CATARACT EXTRACTION W/ INTRAOCULAR LENS  IMPLANT, BILATERAL  2015   CYSTOSCOPY WITH LITHOLAPAXY N/A 12/25/2017   Procedure: CYSTOSCOPY WITH LITHOLAPAXY AND FULGERATION;  Surgeon: Tommy Seal, MD;  Location[facility];  Service: Urology;  Laterality: N/A;   INGUINAL HERNIA REPAIR Right 1980s   LOOP RECORDER INSERTION N/A 04/10/2018   Procedure: LOOP RECORDER INSERTION;  Surgeon:  Tommy Klein, MD;  Location: [facility];  Service: Cardiovascular;  Laterality: N/A;   PENILE PROSTHESIS IMPLANT  12-02-2015   Tommy Tommy Tommy @ [place] in [place] Left 08/2016   TEE WITHOUT CARDIOVERSION N/A 08/31/2020   Procedure: TRANSESOPHAGEAL ECHOCARDIOGRAM (TEE);  Surgeon: Tommy Klein, MD;  Location: [facility];  Service: Cardiovascular;  Laterality: N/A;   TONSILLECTOMY  child   TRANSURETHRAL RESECTION OF PROSTATE  2009   AND REPAIR RECURRENT RIGHT INGUINAL HERNIA    SOCIAL HISTORY: Social History   Socioeconomic History   Marital status: Widowed    Spouse name: engaged   Number of children: 8   Years of education: college-2   Highest education level: Not on file  Occupational History   Occupation: Press photographer   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  Tobacco Use   Smoking status: Former    Years: 20.00    Types: Cigarettes    Quit date: 05/04/1996    Years since quitting: 24.8   Smokeless tobacco: Never  Vaping Use   Vaping Use: Never used  Substance and Sexual Activity   Alcohol use: Yes    Comment: 1-2 glasses of wine every other day   Drug use:  No   Sexual activity: Never  Other Topics Concern   Not on file  Social History Narrative   Not on file   Social Determinants of Health   Financial Resource Strain: Not on file  Food Insecurity: Not on file  Transportation Needs: Not on file  Physical Activity: Not on file  Stress: Not on file  Social Connections: Not on file  Intimate Partner Violence: Not on file    FAMILY HISTORY: Family History  Problem Relation Age of Onset   Diabetes Mother    Heart failure Mother     ALLERGIES:  is allergic to bee venom, metformin and related, sulfamethoxazole-trimethoprim, bee pollen, and metformin hcl.  MEDICATIONS:  Current Outpatient Medications  Medication Sig Dispense Refill   amiodarone (PACERONE) 200 MG tablet Take 1 tablet (200 mg total) by mouth daily. 90 tablet 3    B Complex Vitamins (B COMPLEX 100 PO) Take 1 tablet by mouth daily.     BD PEN NEEDLE NANO 2ND GEN 32G X 4 MM MISC      budesonide (ENTOCORT EC) 3 MG 24 hr capsule Take 9 mg by mouth daily.     cephALEXin (KEFLEX) 500 MG capsule Take 500 mg by mouth 4 (four) times daily.     Cranberry 500 MG TABS Take 500 mg by mouth 2 (two) times daily.     Cyanocobalamin (VITAMIN B-12) 5000 MCG SUBL Place 5,000 mcg under the tongue daily.     D-Mannose 500 MG CAPS Take 500 mg by mouth in the morning and at bedtime.     doxycycline (VIBRAMYCIN) 100 MG capsule Take 1 capsule (100 mg total) by mouth at bedtime. 30 capsule 5   folic acid (FOLVITE) 1 MG tablet TAKE 1 TABLET BY MOUTH EVERY DAY (Patient taking differently: Take 1 mg by mouth daily.) 90 tablet 3   furosemide (LASIX) 20 MG tablet Take 20 mg for a weight 159 or below. Take 40 mg (2 tablets) for a weight over 160 lbs. 45 tablet 3   Insulin Glargine-Lixisenatide 100-33 UNT-MCG/ML SOPN Inject 26 Units into the skin at bedtime. Soliqua 100/33     insulin lispro (HUMALOG) 100 UNIT/ML KwikPen Inject 5 Units into the skin 3 (three) times daily.     metoprolol tartrate (LOPRESSOR) 25 MG tablet Take 1 tablet (25 mg total) by mouth 2 (two) times daily. 60 tablet 0   omeprazole (PRILOSEC) 40 MG capsule Take 40 mg by mouth daily.     pioglitazone (ACTOS) 15 MG tablet Take 15 mg by mouth every morning.     Vibegron (GEMTESA) 75 MG TABS Take 75 mg by mouth daily.     vitamin B-12 (CYANOCOBALAMIN) 500 MCG tablet Take 500 mcg by mouth daily.     No current facility-administered medications for this visit.    REVIEW OF SYSTEMS:   10 Point review of Systems was done is negative except as noted above.  PHYSICAL EXAMINATION: ECOG PERFORMANCE STATUS: 1 - Symptomatic but completely ambulatory  NAD GENERAL:alert, in no acute distress and comfortable SKIN: no acute rashes, no significant lesions EYES: conjunctiva are pink and non-injected, sclera  anicteric OROPHARYNX: MMM, no exudates, no oropharyngeal erythema or ulceration NECK: supple, no JVD LYMPH:  no palpable lymphadenopathy in the cervical, axillary or inguinal regions LUNGS: clear to auscultation b/l with normal respiratory effort HEART: regular rate & rhythm ABDOMEN:  normoactive bowel sounds , non tender, not distended. Extremity: no pedal edema PSYCH: alert & oriented x 3 with fluent speech  NEURO: no focal motor/sensory deficits   LABORATORY DATA:  I have reviewed the data as listed  CBC Latest Ref Rng & Units 03/09/2021 02/23/2021 02/09/2021  WBC 4.0 - 10.5 K/uL 12.4(H) 11.3(H) 9.9  Hemoglobin 13.0 - 17.0 g/dL 8.0(L) 6.8(LL) 7.6(L)  Hematocrit 39.0 - 52.0 % 24.5(L) 20.6(L) 22.5(L)  Platelets 150 - 400 K/uL 281 199 246    CMP Latest Ref Rng & Units 03/09/2021 02/25/2021 02/23/2021  Glucose 70 - 99 mg/dL 76 145(H) 223(H)  BUN 8 - 23 mg/dL $Remove'17 18 13  'OHhOxwR$ Creatinine 0.61 - 1.24 mg/dL 0.64 0.56(L) 0.44(L)  Sodium 135 - 145 mmol/L 139 136 135  Potassium 3.5 - 5.1 mmol/L 4.1 4.3 4.4  Chloride 98 - 111 mmol/L 104 99 98  CO2 22 - 32 mmol/L $RemoveB'26 26 28  'ssYyFoyE$ Calcium 8.9 - 10.3 mg/dL 9.2 8.9 8.8(L)  Total Protein 6.5 - 8.1 g/dL 6.3(L) 5.6(L) 6.0(L)  Total Bilirubin 0.3 - 1.2 mg/dL 1.0 0.8 1.0  Alkaline Phos 38 - 126 U/L 81 86 67  AST 15 - 41 U/L $Remo'19 30 19  'szaHa$ ALT 0 - 44 U/L 51(H) 64(H) 48(H)   09/28/2016 Flow Cytometry:   RADIOGRAPHIC STUDIES: I have personally reviewed the radiological images as listed and agreed with the findings in the report. MR ANGIO HEAD WO CONTRAST  Result Date: 02/25/2021 CLINICAL DATA:  Neuro deficit, acute stroke suspected. EXAM: MRA NECK WITHOUT AND WITH CONTRAST MRA HEAD WITHOUT CONTRAST TECHNIQUE: Multiplanar and multiecho pulse sequences of the neck were obtained without and with intravenous contrast. Angiographic images of the neck were obtained using MRA technique without and with intravenous contrast; Angiographic images of the Circle of Willis were  obtained using MRA technique without intravenous contrast. CONTRAST:  7.49mL GADAVIST GADOBUTROL 1 MMOL/ML IV SOLN COMPARISON:  None. FINDINGS: MRA NECK FINDINGS Aorta: Great vessel origins appear patent on postcontrast imaging. Carotid system: Right common carotid artery and internal carotid arteries are patent without evidence of significant stenosis. Left common carotid artery is patent. Moderate stenosis of the left proximal internal carotid artery at its origin. Vertebral arteries: Codominant. Likely moderate left vertebral artery origin stenosis. The remainder of the vertebral arteries are patent bilaterally not additional significant stenosis. MRA HEAD FINDINGS Anterior circulation: Bilateral intracranial ICAs, MCAs, and ACAs are patent without proximal hemodynamically significant stenosis. Posterior circulation: Bilateral intradural vertebral arteries, basilar artery, and posterior cerebral arteries are patent. Possibly severe left P2 PCA stenosis. IMPRESSION: MRA Head: 1. No large vessel occlusion. 2. Possibly severe left P2 PCA stenosis. MRA Neck: 1. Moderate left ICA origin stenosis. 2. Likely moderate left vertebral artery origin stenosis. Electronically Signed   By: Margaretha Sheffield MD   On: 02/25/2021 17:52   MR ANGIO NECK W WO CONTRAST  Result Date: 02/25/2021 CLINICAL DATA:  Neuro deficit, acute stroke suspected. EXAM: MRA NECK WITHOUT AND WITH CONTRAST MRA HEAD WITHOUT CONTRAST TECHNIQUE: Multiplanar and multiecho pulse sequences of the neck were obtained without and with intravenous contrast. Angiographic images of the neck were obtained using MRA technique without and with intravenous contrast; Angiographic images of the Circle of Willis were obtained using MRA technique without intravenous contrast. CONTRAST:  7.35mL GADAVIST GADOBUTROL 1 MMOL/ML IV SOLN COMPARISON:  None. FINDINGS: MRA NECK FINDINGS Aorta: Great vessel origins appear patent on postcontrast imaging. Carotid system: Right  common carotid artery and internal carotid arteries are patent without evidence of significant stenosis. Left common carotid artery is patent. Moderate stenosis of the left proximal internal carotid artery at its origin.  Vertebral arteries: Codominant. Likely moderate left vertebral artery origin stenosis. The remainder of the vertebral arteries are patent bilaterally not additional significant stenosis. MRA HEAD FINDINGS Anterior circulation: Bilateral intracranial ICAs, MCAs, and ACAs are patent without proximal hemodynamically significant stenosis. Posterior circulation: Bilateral intradural vertebral arteries, basilar artery, and posterior cerebral arteries are patent. Possibly severe left P2 PCA stenosis. IMPRESSION: MRA Head: 1. No large vessel occlusion. 2. Possibly severe left P2 PCA stenosis. MRA Neck: 1. Moderate left ICA origin stenosis. 2. Likely moderate left vertebral artery origin stenosis. Electronically Signed   By: Margaretha Sheffield MD   On: 02/25/2021 17:52     ASSESSMENT & PLAN:   80 yo with   1) T cell large granular leukemia s/p transfusion dependent anemia - previous blood transfusion dependent and was last on MTX $Remo'30mg'CwDNn$  po weekly a few months ago 2) IgG Lambda MGUS 3) recent COVID-19 infection  PLAN: -Discussed pt labwork today, 03/09/2021; CBC - hgb 8, lymphocytes increasing suggesting gradual progression of his T-LGL. -we discussed that asusming his other medical issues remain stable -- we could continue with current conservative approach with labs monitoring and supportive transfusions vs taking a Peterson involved and active treatment approach. -we discussed that he is unlikely to respond again to MTX and we would consider Cytoxan or cyclosporine vs off label use of Jakafi if possible. -patient wants to thnk about these options. -we discussed that if we are considering active treatment I would recommend rpt BM Bx prior to starting new treatment to assess exactly the current  status of his disease and BM -Continue 2 mg Folic acid, Vitamin U02, and B-complex daily. -Continue 2 units PRBC every 2 weeks as needed with labs.  4) RLE swelling [place] venosu ordered and done - reviewed - neg for DVT. Continue f/u with PCP  FOLLOW UP: Please schedule patient for labs and 2 units of PRBCs every 2 weeks x 6 Return to clinic with Tommy. Irene Limbo in 4 weeks Patient to decide on bone marrow biopsy and other treatment options.    The total time spent in the appointment was 30 minutes and Peterson than 50% was on counseling and direct patient cares.  All of the patient's questions were answered with apparent satisfaction. The patient knows to call the clinic with any problems, questions or concerns.   Sullivan Lone MD [place] AAHIVMS [facility] Hematology/Oncology Physician [facility]  (Office):       [phone] (Work cell):  [phone] (Fax):           [phone]  I, Reinaldo Raddle, am acting as scribe for Tommy. Sullivan Lone, MD.   .I have reviewed the above documentation for accuracy and completeness, and I agree with the above. Brunetta Genera MD

## 2021-03-09 ENCOUNTER — Inpatient Hospital Stay: Payer: Medicare HMO | Attending: Hematology

## 2021-03-09 ENCOUNTER — Other Ambulatory Visit: Payer: Self-pay

## 2021-03-09 ENCOUNTER — Inpatient Hospital Stay: Payer: Medicare HMO

## 2021-03-09 ENCOUNTER — Inpatient Hospital Stay (HOSPITAL_BASED_OUTPATIENT_CLINIC_OR_DEPARTMENT_OTHER): Payer: Medicare HMO | Admitting: Hematology

## 2021-03-09 VITALS — BP 137/57 | HR 70 | Temp 98.2°F | Resp 17 | Ht 69.5 in | Wt 160.3 lb

## 2021-03-09 DIAGNOSIS — D472 Monoclonal gammopathy: Secondary | ICD-10-CM | POA: Diagnosis not present

## 2021-03-09 DIAGNOSIS — E119 Type 2 diabetes mellitus without complications: Secondary | ICD-10-CM | POA: Insufficient documentation

## 2021-03-09 DIAGNOSIS — Z794 Long term (current) use of insulin: Secondary | ICD-10-CM | POA: Insufficient documentation

## 2021-03-09 DIAGNOSIS — Z85828 Personal history of other malignant neoplasm of skin: Secondary | ICD-10-CM | POA: Diagnosis not present

## 2021-03-09 DIAGNOSIS — Z79899 Other long term (current) drug therapy: Secondary | ICD-10-CM | POA: Insufficient documentation

## 2021-03-09 DIAGNOSIS — R197 Diarrhea, unspecified: Secondary | ICD-10-CM | POA: Diagnosis not present

## 2021-03-09 DIAGNOSIS — Z8616 Personal history of COVID-19: Secondary | ICD-10-CM | POA: Diagnosis not present

## 2021-03-09 DIAGNOSIS — D649 Anemia, unspecified: Secondary | ICD-10-CM | POA: Diagnosis not present

## 2021-03-09 DIAGNOSIS — M7989 Other specified soft tissue disorders: Secondary | ICD-10-CM

## 2021-03-09 DIAGNOSIS — R350 Frequency of micturition: Secondary | ICD-10-CM | POA: Diagnosis not present

## 2021-03-09 DIAGNOSIS — C91Z0 Other lymphoid leukemia not having achieved remission: Secondary | ICD-10-CM

## 2021-03-09 LAB — CMP (CANCER CENTER ONLY)
ALT: 51 U/L — ABNORMAL HIGH (ref 0–44)
AST: 19 U/L (ref 15–41)
Albumin: 3.3 g/dL — ABNORMAL LOW (ref 3.5–5.0)
Alkaline Phosphatase: 81 U/L (ref 38–126)
Anion gap: 9 (ref 5–15)
BUN: 17 mg/dL (ref 8–23)
CO2: 26 mmol/L (ref 22–32)
Calcium: 9.2 mg/dL (ref 8.9–10.3)
Chloride: 104 mmol/L (ref 98–111)
Creatinine: 0.64 mg/dL (ref 0.61–1.24)
GFR, Estimated: >60 mL/min (ref >60)
Glucose, Bld: 76 mg/dL (ref 70–99)
Potassium: 4.1 mmol/L (ref 3.5–5.1)
Sodium: 139 mmol/L (ref 135–145)
Total Bilirubin: 1 mg/dL (ref 0.3–1.2)
Total Protein: 6.3 g/dL — ABNORMAL LOW (ref 6.5–8.1)

## 2021-03-09 LAB — CBC WITH DIFFERENTIAL/PLATELET
Abs Immature Granulocytes: 0.15 10*3/uL — ABNORMAL HIGH (ref 0.00–0.07)
Basophils Absolute: 0 10*3/uL (ref 0.0–0.1)
Basophils Relative: 0 %
Eosinophils Absolute: 0 10*3/uL (ref 0.0–0.5)
Eosinophils Relative: 0 %
HCT: 24.5 % — ABNORMAL LOW (ref 39.0–52.0)
Hemoglobin: 8 g/dL — ABNORMAL LOW (ref 13.0–17.0)
Immature Granulocytes: 1 %
Lymphocytes Relative: 56 %
Lymphs Abs: 6.9 10*3/uL — ABNORMAL HIGH (ref 0.7–4.0)
MCH: 30.3 pg (ref 26.0–34.0)
MCHC: 32.7 g/dL (ref 30.0–36.0)
MCV: 92.8 fL (ref 80.0–100.0)
Monocytes Absolute: 1.4 10*3/uL — ABNORMAL HIGH (ref 0.1–1.0)
Monocytes Relative: 12 %
Neutro Abs: 3.9 10*3/uL (ref 1.7–7.7)
Neutrophils Relative %: 31 %
Platelets: 281 10*3/uL (ref 150–400)
RBC: 2.64 MIL/uL — ABNORMAL LOW (ref 4.22–5.81)
RDW: 18.5 % — ABNORMAL HIGH (ref 11.5–15.5)
WBC: 12.4 10*3/uL — ABNORMAL HIGH (ref 4.0–10.5)
nRBC: 0 % (ref 0.0–0.2)

## 2021-03-09 LAB — SAMPLE TO BLOOD BANK

## 2021-03-10 ENCOUNTER — Telehealth: Payer: Self-pay | Admitting: Hematology

## 2021-03-10 NOTE — Telephone Encounter (Signed)
Unable to leave message with follow-up appointments per 8/3 los. Mailed calendar.

## 2021-03-16 DIAGNOSIS — E1122 Type 2 diabetes mellitus with diabetic chronic kidney disease: Secondary | ICD-10-CM | POA: Diagnosis not present

## 2021-03-17 DIAGNOSIS — E1140 Type 2 diabetes mellitus with diabetic neuropathy, unspecified: Secondary | ICD-10-CM | POA: Diagnosis not present

## 2021-03-17 DIAGNOSIS — Z794 Long term (current) use of insulin: Secondary | ICD-10-CM | POA: Diagnosis not present

## 2021-03-17 DIAGNOSIS — R197 Diarrhea, unspecified: Secondary | ICD-10-CM | POA: Diagnosis not present

## 2021-03-17 DIAGNOSIS — Z87440 Personal history of urinary (tract) infections: Secondary | ICD-10-CM | POA: Diagnosis not present

## 2021-03-17 DIAGNOSIS — D472 Monoclonal gammopathy: Secondary | ICD-10-CM | POA: Diagnosis not present

## 2021-03-17 DIAGNOSIS — E119 Type 2 diabetes mellitus without complications: Secondary | ICD-10-CM | POA: Diagnosis not present

## 2021-03-17 DIAGNOSIS — C91Z0 Other lymphoid leukemia not having achieved remission: Secondary | ICD-10-CM | POA: Diagnosis not present

## 2021-03-17 DIAGNOSIS — Z6825 Body mass index (BMI) 25.0-25.9, adult: Secondary | ICD-10-CM | POA: Diagnosis not present

## 2021-03-17 DIAGNOSIS — K529 Noninfective gastroenteritis and colitis, unspecified: Secondary | ICD-10-CM | POA: Diagnosis not present

## 2021-03-18 ENCOUNTER — Other Ambulatory Visit: Payer: Self-pay

## 2021-03-18 ENCOUNTER — Ambulatory Visit (HOSPITAL_COMMUNITY)
Admission: RE | Admit: 2021-03-18 | Discharge: 2021-03-18 | Disposition: A | Discharge status: Home / Self Care | Payer: Medicare HMO | Source: Ambulatory Visit | Attending: Hematology | Admitting: Hematology

## 2021-03-18 ENCOUNTER — Telehealth: Payer: Self-pay

## 2021-03-18 DIAGNOSIS — M7989 Other specified soft tissue disorders: Secondary | ICD-10-CM | POA: Diagnosis not present

## 2021-03-18 NOTE — Progress Notes (Signed)
Bilateral lower extremity venous duplex completed. Refer to "CV Proc" under chart review to view preliminary results.  03/18/2021 10:45 AM Kelby Aline., MHA, RVT, RDCS, RDMS

## 2021-03-18 NOTE — Telephone Encounter (Signed)
Sharyn Lull from Radiology called to advise pt is negative for DVT.

## 2021-03-21 ENCOUNTER — Ambulatory Visit: Payer: Medicare HMO | Admitting: Neurology

## 2021-03-21 ENCOUNTER — Other Ambulatory Visit: Payer: Self-pay

## 2021-03-21 DIAGNOSIS — C91Z0 Other lymphoid leukemia not having achieved remission: Secondary | ICD-10-CM

## 2021-03-22 ENCOUNTER — Observation Stay (HOSPITAL_COMMUNITY): Payer: No Typology Code available for payment source

## 2021-03-22 ENCOUNTER — Observation Stay (HOSPITAL_COMMUNITY)
Admission: EM | Admit: 2021-03-22 | Discharge: 2021-03-23 | Disposition: A | Discharge status: Home / Self Care | Payer: No Typology Code available for payment source | Attending: Emergency Medicine | Admitting: Emergency Medicine

## 2021-03-22 ENCOUNTER — Encounter (HOSPITAL_COMMUNITY): Payer: Self-pay

## 2021-03-22 ENCOUNTER — Other Ambulatory Visit: Payer: Self-pay

## 2021-03-22 DIAGNOSIS — Z8616 Personal history of COVID-19: Secondary | ICD-10-CM | POA: Diagnosis not present

## 2021-03-22 DIAGNOSIS — Z85828 Personal history of other malignant neoplasm of skin: Secondary | ICD-10-CM | POA: Insufficient documentation

## 2021-03-22 DIAGNOSIS — C91Z0 Other lymphoid leukemia not having achieved remission: Secondary | ICD-10-CM | POA: Diagnosis present

## 2021-03-22 DIAGNOSIS — Z7982 Long term (current) use of aspirin: Secondary | ICD-10-CM | POA: Insufficient documentation

## 2021-03-22 DIAGNOSIS — Z794 Long term (current) use of insulin: Secondary | ICD-10-CM | POA: Insufficient documentation

## 2021-03-22 DIAGNOSIS — Z87891 Personal history of nicotine dependence: Secondary | ICD-10-CM | POA: Insufficient documentation

## 2021-03-22 DIAGNOSIS — I480 Paroxysmal atrial fibrillation: Secondary | ICD-10-CM | POA: Insufficient documentation

## 2021-03-22 DIAGNOSIS — Z79899 Other long term (current) drug therapy: Secondary | ICD-10-CM | POA: Insufficient documentation

## 2021-03-22 DIAGNOSIS — Z20822 Contact with and (suspected) exposure to covid-19: Secondary | ICD-10-CM | POA: Insufficient documentation

## 2021-03-22 DIAGNOSIS — K52831 Collagenous colitis: Secondary | ICD-10-CM | POA: Diagnosis present

## 2021-03-22 DIAGNOSIS — D649 Anemia, unspecified: Principal | ICD-10-CM | POA: Insufficient documentation

## 2021-03-22 DIAGNOSIS — R69 Illness, unspecified: Secondary | ICD-10-CM

## 2021-03-22 DIAGNOSIS — I10 Essential (primary) hypertension: Secondary | ICD-10-CM | POA: Diagnosis present

## 2021-03-22 DIAGNOSIS — E118 Type 2 diabetes mellitus with unspecified complications: Secondary | ICD-10-CM | POA: Diagnosis present

## 2021-03-22 DIAGNOSIS — R531 Weakness: Secondary | ICD-10-CM | POA: Diagnosis present

## 2021-03-22 LAB — CBC WITH DIFFERENTIAL/PLATELET
Abs Immature Granulocytes: 0.27 10*3/uL — ABNORMAL HIGH (ref 0.00–0.07)
Basophils Absolute: 0 10*3/uL (ref 0.0–0.1)
Basophils Relative: 0 %
Eosinophils Absolute: 0 10*3/uL (ref 0.0–0.5)
Eosinophils Relative: 0 %
HCT: 16.6 % — ABNORMAL LOW (ref 39.0–52.0)
Hemoglobin: 5.4 g/dL — CL (ref 13.0–17.0)
Immature Granulocytes: 2 %
Lymphocytes Relative: 24 %
Lymphs Abs: 2.7 10*3/uL (ref 0.7–4.0)
MCH: 29.8 pg (ref 26.0–34.0)
MCHC: 32.5 g/dL (ref 30.0–36.0)
MCV: 91.7 fL (ref 80.0–100.0)
Monocytes Absolute: 1 10*3/uL (ref 0.1–1.0)
Monocytes Relative: 9 %
Neutro Abs: 7.3 10*3/uL (ref 1.7–7.7)
Neutrophils Relative %: 65 %
Platelets: 328 10*3/uL (ref 150–400)
RBC: 1.81 MIL/uL — ABNORMAL LOW (ref 4.22–5.81)
RDW: 17.3 % — ABNORMAL HIGH (ref 11.5–15.5)
WBC: 11.2 10*3/uL — ABNORMAL HIGH (ref 4.0–10.5)
nRBC: 0 % (ref 0.0–0.2)

## 2021-03-22 LAB — COMPREHENSIVE METABOLIC PANEL
ALT: 38 U/L (ref 0–44)
AST: 20 U/L (ref 15–41)
Albumin: 3 g/dL — ABNORMAL LOW (ref 3.5–5.0)
Alkaline Phosphatase: 73 U/L (ref 38–126)
Anion gap: 8 (ref 5–15)
BUN: 18 mg/dL (ref 8–23)
CO2: 27 mmol/L (ref 22–32)
Calcium: 8.7 mg/dL — ABNORMAL LOW (ref 8.9–10.3)
Chloride: 101 mmol/L (ref 98–111)
Creatinine, Ser: 0.45 mg/dL — ABNORMAL LOW (ref 0.61–1.24)
GFR, Estimated: >60 mL/min (ref >60)
Glucose, Bld: 209 mg/dL — ABNORMAL HIGH (ref 70–99)
Potassium: 3.9 mmol/L (ref 3.5–5.1)
Sodium: 136 mmol/L (ref 135–145)
Total Bilirubin: 1.2 mg/dL (ref 0.3–1.2)
Total Protein: 6.2 g/dL — ABNORMAL LOW (ref 6.5–8.1)

## 2021-03-22 LAB — PREPARE RBC (CROSSMATCH)

## 2021-03-22 LAB — URINALYSIS, ROUTINE W REFLEX MICROSCOPIC
Bilirubin Urine: NEGATIVE
Glucose, UA: 50 mg/dL — AB
Hgb urine dipstick: NEGATIVE
Ketones, ur: NEGATIVE mg/dL
Leukocytes,Ua: NEGATIVE
Nitrite: NEGATIVE
Protein, ur: NEGATIVE mg/dL
Specific Gravity, Urine: 1.011 (ref 1.005–1.030)
pH: 6 (ref 5.0–8.0)

## 2021-03-22 LAB — RESP PANEL BY RT-PCR (FLU A&B, COVID) ARPGX2
Influenza A by PCR: NEGATIVE
Influenza B by PCR: NEGATIVE
SARS Coronavirus 2 by RT PCR: NEGATIVE

## 2021-03-22 LAB — TROPONIN I (HIGH SENSITIVITY): Troponin I (High Sensitivity): 4 ng/L (ref <18)

## 2021-03-22 IMAGING — DX DG CHEST 1V PORT
1 series · 1 of 1 positions shown · non-contrast
Comparison: [DATE]

CLINICAL DATA: Shortness of breath and fatigue.

EXAM:
PORTABLE CHEST 1 VIEW

[chest ap]
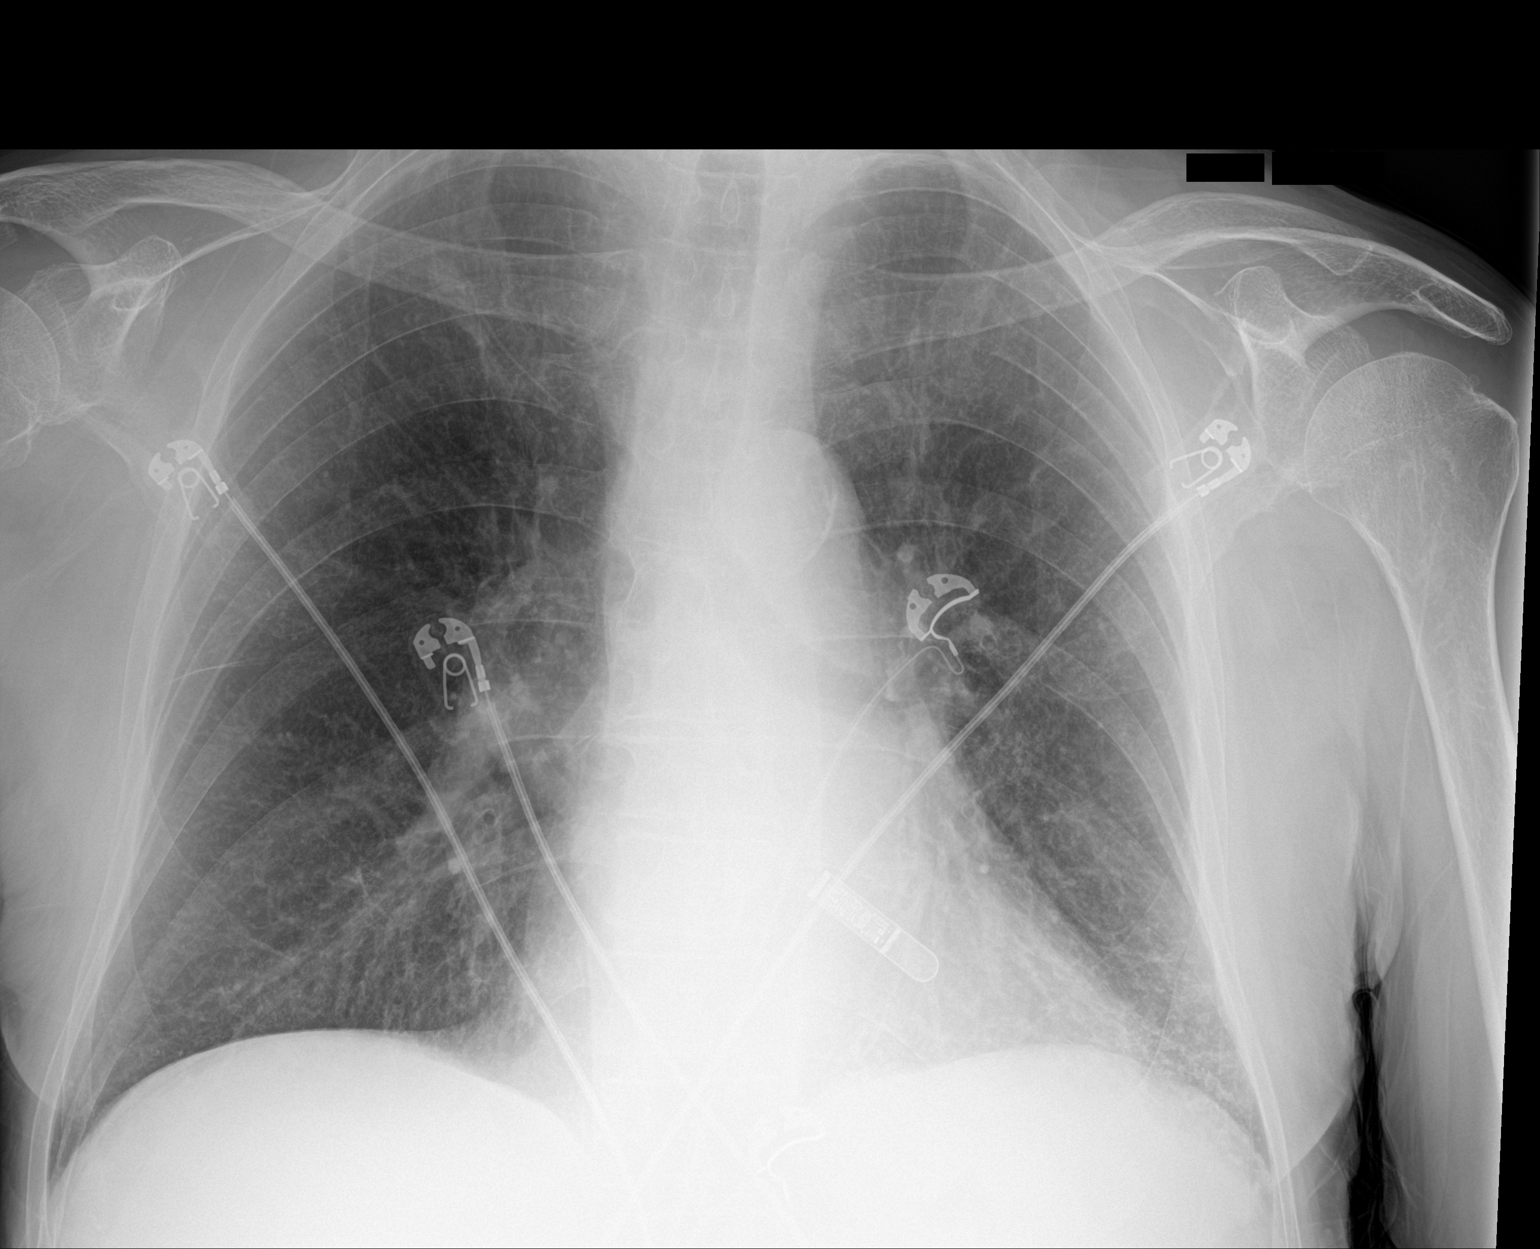

[1 of 1 positions shown; findings below may reference images not displayed]

FINDINGS: Findings the cardiac silhouette, mediastinal and hilar contours are
within normal limits and stable. Stable left-sided loop recorder.

Stable emphysematous changes and pulmonary scarring but no acute
overlying pulmonary process.
IMPRESSION: Emphysematous changes and pulmonary scarring but no acute overlying
pulmonary process.

## 2021-03-22 MED ORDER — SODIUM CHLORIDE 0.9% IV SOLUTION
Freq: Once | INTRAVENOUS | Status: AC
Start: 2021-03-22 — End: 2021-03-23

## 2021-03-22 NOTE — ED Provider Notes (Signed)
[facility] DEPT Provider Note   CSN: [id number] Arrival date & time: 03/22/21  1636     History Chief Complaint  Patient presents with   Fatigue    Tommy Peterson is a 80 y.o. male.  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  Patient has had transfusions about every 2 weeks.  He reports he is unusually weak and short of breath.  He is due for transfusion within the next few days but at this time is more symptomatic.  He reports he is too fatigued to do any activities and mostly just in bed.  No chest pain.  No fevers or chills.  Patient has not been vomiting.    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--  treatment weekly methotraxate and transfusion's (last PRBCs 07-19-2017)   Collagenous colitis    chronic--- intermittant between diarrahea and constipation   ED (erectile dysfunction)    HCAP (healthcare-associated pneumonia)    History of adenomatous polyp of colon    History of Clostridium difficile colitis 05/2016   History of pneumonia 11/08/2017   CAP -- LLL---  12-19-2017  per pt no residual symptoms   History of SCC (squamous cell carcinoma) of skin    History of sepsis    11-20-2017 severe sepsis due to UTI;  10/ 2017sepsis due to c-diff colitis   Leucocytosis    chronic   Lower urinary tract symptoms (LUTS)    Macrocytic anemia    since 02/ 2015   MGUS  (monoclonal gammopathy of unknown significance)    hemotologist-  [name] @ [facility]   Monoclonal paraproteinemia    Neuropathy, peripheral    Nodular basal cell carcinoma (BCC) 10/01/2018   Left Neck(Nodular) Pt Does Not Want Treatment   Nodular basal cell carcinoma (BCC) 10/01/2018   Mid Forehead (treament curet and excision)   OA (osteoarthritis)    Prostatic stone    Rash of face    RIGHT SIDE   Raynaud's syndrome    Recurrent BCC (basal cell carcinoma) 03/05/2019   positive margin   SCC (squamous cell carcinoma) 08/10/2009   Central Forehead (Moh's Dr. Rhona Raider @ [facility])   SCC (squamous cell carcinoma) 12/08/2011   CIS-Left Cheek (treatment Aldara @ [facility])   SCC (squamous cell carcinoma) 07/09/2015   CIS-Left Forehead (treament @ [facility])   Septic shock ([place])    x 3 w/UTI   Squamous cell carcinoma in situ (SCCIS) 12/08/2011   Left Cheek (treatment Aldara @ [facility])    Superficial basal cell carcinoma ([place]) 10/01/2018   Left Shin-Pt does not want treatment   Thin skin    fragile   Transfusion-dependent anemia since 10/ 2017   last transfusion PRBCs 07-19-2017  per hemologist note dated 10-25-2017   Type 2 diabetes mellitus treated with insulin ([place])    followed by [name] (pcp)   Urinary hesitancy  Wears contact lenses    LEFT EYE ONLY    Patient Active Problem List   Diagnosis Date Noted   Severe sepsis ([place]) 12/31/2020   Epididymitis 12/30/2020   Dehydration 12/30/2020   Hyponatremia 12/30/2020   Head injury 12/30/2020   Sepsis secondary to UTI ([place]) 12/09/2020   Frequent falls 12/09/2020   Acute on chronic heart failure with preserved ejection fraction (HFpEF) ([place]) 11/07/2020   Volume overload 11/06/2020   Chronic anticoagulation 10/11/2020   Persistent atrial fibrillation ([place])    Acute hypoxemic respiratory failure due to COVID-19 ([place]) 04/07/2020   Pressure injury of skin 09/02/2018   UTI (urinary tract infection) 09/01/2018    Sepsis ([place]) 07/28/2018   CAP (community acquired pneumonia) 07/28/2018   Paroxysmal atrial fibrillation ([place]) 04/10/2018   Aortic atherosclerosis ([place]) 04/08/2018   DM type 2 (diabetes mellitus, type 2) ([place]) 02/19/2018   Acute respiratory failure with hypoxia ([place]) 02/18/2018   MGUS (monoclonal gammopathy of unknown significance) 11/23/2017   BPH (benign prostatic hyperplasia) 11/23/2017   Left lower lobe pneumonia 11/20/2017   Nausea vomiting and diarrhea 11/20/2017   Leukemia not having achieved remission ([place]) 12/26/2016   Anemia 12/26/2016   Large granular lymphocytic leukemia ([place]) 12/26/2016   Diabetes mellitus with complication (HCC)    Macrocytic anemia    DOE (dyspnea on exertion)    Anxiety    History of GI bleed    C. difficile colitis 05/31/2016   Lactic acidosis    History of bone marrow biopsy 05/27/2016   Fever 78/29/5621   Acute metabolic encephalopathy 30/86/5784   Chest pain 05/04/2016   Benign fibroma of prostate 09/20/2015   HLD (hyperlipidemia) 09/20/2015   Essential hypertension 09/20/2015   Arthritis, degenerative 09/20/2015   Colitis, collagenous 09/09/2015   Absolute anemia 09/08/2014   Adenoma of large intestine 07/13/2014   Disturbance of skin sensation 01/09/2014   Recurrent major depressive disorder, in full remission ([place]) 07/18/2012   Allergic rhinitis 08/29/2011    Past Surgical History:  Procedure Laterality Date   BONE MARROW BIOPSY Right 05-24-2016;  08-20-2014   CARDIOVASCULAR STRESS TEST  11-26-2012   [name]  @ [place] ([place]   normal nuclear study w/ no ischemia/  normal LV function and wall motion , ef 60% (find in care everywhere,epic)   CARDIOVERSION N/A 08/31/2020   Procedure: CARDIOVERSION;  Surgeon: [name], MD;  Location: [facility];  Service: Cardiovascular;  Laterality: N/A;   CATARACT EXTRACTION W/ INTRAOCULAR LENS  IMPLANT, BILATERAL  2015   CYSTOSCOPY WITH LITHOLAPAXY N/A 12/25/2017   Procedure:  CYSTOSCOPY WITH LITHOLAPAXY AND FULGERATION;  Surgeon: [name], MD;  Location: [facility];  Service: Urology;  Laterality: N/A;   INGUINAL HERNIA REPAIR Right 1980s   LOOP RECORDER INSERTION N/A 04/10/2018   Procedure: LOOP RECORDER INSERTION;  Surgeon: [name], MD;  Location: [facility];  Service: Cardiovascular;  Laterality: N/A;   PENILE PROSTHESIS IMPLANT  12-02-2015   dr [name] @ [facility] in [place] Left 08/2016   TEE WITHOUT CARDIOVERSION N/A 08/31/2020   Procedure: TRANSESOPHAGEAL ECHOCARDIOGRAM (TEE);  Surgeon: [name], MD;  Location: [facility];  Service: Cardiovascular;  Laterality: N/A;   TONSILLECTOMY  child   TRANSURETHRAL RESECTION OF PROSTATE  2009   AND REPAIR RECURRENT RIGHT INGUINAL HERNIA       Family History  Problem Relation Age of Onset   Diabetes Mother    Heart failure Mother  Social History   Tobacco Use   Smoking status: Former    Years: 20.00    Types: Cigarettes    Quit date: 05/04/1996    Years since quitting: 24.8   Smokeless tobacco: Never  Vaping Use   Vaping Use: Never used  Substance Use Topics   Alcohol use: Yes    Comment: 1-2 glasses of wine every other day   Drug use: No    Home Medications Prior to Admission medications   Medication Sig Start Date End Date Taking? Authorizing Provider  amiodarone (PACERONE) 200 MG tablet Take 1 tablet (200 mg total) by mouth daily. 09/29/20   [facility], Tami Lin, PA  B Complex Vitamins (B COMPLEX 100 PO) Take 1 tablet by mouth daily.    [provider]  BD PEN NEEDLE NANO 2ND GEN 32G X 4 MM MISC  07/22/20   [provider]  budesonide (ENTOCORT EC) 3 MG 24 hr capsule Take 9 mg by mouth daily.    [provider]  cephALEXin (KEFLEX) 500 MG capsule Take 500 mg by mouth 4 (four) times daily.    [provider]  Cranberry 400 MG TABS Take 400 mg by mouth 2 (two) times daily.    [provider]  Cyanocobalamin (VITAMIN B-12) 5000 MCG SUBL Place 5,000 mcg under the tongue daily.    [provider]  D-Mannose 500 MG CAPS Take 500 mg by mouth in the morning and at bedtime.    [provider]  doxycycline (VIBRAMYCIN) 100 MG capsule Take 1 capsule (100 mg total) by mouth at bedtime. 01/02/21   [name], MD  folic acid (FOLVITE) 1 MG tablet TAKE 1 TABLET BY MOUTH EVERY DAY Patient taking differently: Take 1 mg by mouth daily. 07/23/20   Brunetta Genera, MD  furosemide (LASIX) 20 MG tablet Take 20 mg for a weight 159 or below. Take 40 mg (2 tablets) for a weight over 160 lbs. 02/28/21   Croitoru, Mihai, MD  Insulin Glargine-Lixisenatide 100-33 UNT-MCG/ML SOPN Inject 26 Units into the skin at bedtime. [place] 100/33    [provider]  insulin lispro (HUMALOG) 100 UNIT/ML KwikPen Inject 5 Units into the skin 3 (three) times daily.    [provider]  metoprolol tartrate (LOPRESSOR) 25 MG tablet Take 1 tablet (25 mg total) by mouth 2 (two) times daily. Patient taking differently: Take 50 mg by mouth 2 (two) times daily. 01/03/21   Shawna Clamp, MD  omeprazole (PRILOSEC) 40 MG capsule Take 40 mg by mouth daily.    [provider]  oxybutynin (DITROPAN) 5 MG tablet 1 tablet    [provider]  pioglitazone (ACTOS) 15 MG tablet Take 15 mg by mouth every morning. 10/18/17   [provider]  Vibegron (GEMTESA) 75 MG TABS Take 75 mg by mouth daily.    [provider]  vitamin B-12 (CYANOCOBALAMIN) 500 MCG tablet Take 500 mcg by mouth daily.    [provider]    Allergies    Bee venom, Metformin and related, Sulfamethoxazole-trimethoprim, Bee pollen, and Metformin hcl  Review of Systems   Review of Systems 10 systems reviewed and negative except as per HPI Physical Exam Updated Vital Signs BP (!) 128/48   Pulse 67   Temp 97.7 F (36.5 C) (Oral)   Resp 16   SpO2 93%   Physical  Exam Constitutional:      Comments: Alert but fatigued in appearance.  No respiratory distress at rest.  HENT:     Mouth/Throat:     Pharynx: Oropharynx is clear.  Eyes:     Comments: Conjunctiva pale.  Extraocular motions intact  Cardiovascular:     Rate and Rhythm: Normal rate and regular rhythm.  Pulmonary:     Effort: Pulmonary effort is normal.     Breath sounds: Normal breath sounds.  Abdominal:     General: There is no distension.     Palpations: Abdomen is soft.     Tenderness: There is no abdominal tenderness. There is no guarding.  Musculoskeletal:        General: Normal range of motion.  Skin:    General: Skin is warm and dry.     Coloration: Skin is pale.  Neurological:     General: No focal deficit present.     Mental Status: He is oriented to person, place, and time.     Coordination: Coordination normal.    ED Results / Procedures / Treatments   Labs (all labs ordered are listed, but only abnormal results are displayed) Labs Reviewed  CBC WITH DIFFERENTIAL/PLATELET - Abnormal; Notable for the following components:      Result Value   WBC 11.2 (*)    RBC 1.81 (*)    Hemoglobin 5.4 (*)    HCT 16.6 (*)    RDW 17.3 (*)    Abs Immature Granulocytes 0.27 (*)    All other components within normal limits  RESP PANEL BY RT-PCR (FLU A&B, COVID) [id number]  COMPREHENSIVE METABOLIC PANEL  URINALYSIS, ROUTINE W REFLEX MICROSCOPIC  TYPE AND SCREEN    EKG EKG Interpretation  Date/Time:  Tuesday March 22 2021 20:47:42 EDT Ventricular Rate:  67 PR Interval:  191 QRS Duration: 105 QT Interval:  467 QTC Calculation: 493 R Axis:   -16 Text Interpretation: Sinus rhythm Atrial premature complexes in couplets Borderline left axis deviation Probable anteroseptal infarct, old Nonspecific T abnormalities, lateral leads No significant change since last tracing Confirmed by Wandra Arthurs [phone]) on 03/24/2021 11:57:26 AM  Radiology No results found.  Procedures Procedures    Medications Ordered in ED Medications - No data to display  ED Course  I have reviewed the triage vital signs and the nursing notes.  Pertinent labs & imaging results that were available during my care of the patient were reviewed by me and considered in my medical decision making (see chart for details).    MDM Rules/Calculators/A&P                           Patient presents with severe fatigue and weakness.  He has history of current T-cell leukemia. Severely anemic today and symptomatic. Will proceed with transfusion and diagnostic w/u for other comorbid causes of symptoms. Plan for admission. Final Clinical Impression(s) / ED Diagnoses Final diagnoses:  Symptomatic anemia  Severe comorbid illness    Rx / DC Orders ED Discharge Orders     None        Charlesetta Shanks, MD 03/25/21 [phone]

## 2021-03-22 NOTE — ED Notes (Signed)
Blood bank has 2 units of blood ready for this patient.

## 2021-03-22 NOTE — ED Triage Notes (Signed)
Pt reports extreme fatigue over the past few days. Pt reports hx of leukemia and needing 2 pints of blood every 2 weeks. Pt was scheduled to have blood transfusion tomorrow but cancelled due to feeling unwell.

## 2021-03-22 NOTE — ED Provider Notes (Signed)
Emergency Medicine Provider Triage Evaluation Note  ALECXIS QUATTRONE , a 80 y.o. male  was evaluated in triage.  Pt complains of malaise/fatigue since yesterday. Has only been able to sleep. States his hgb is always low, he reports he receives 2 units of blood every 2 weeks @ [facility]. States he recently received transfusion. He notes 7-10 days of rectal bleeding with wiping, every once in awhile in his depends.   Review of Systems  Positive: Fatigue/malaise, rectal bleeding.  Negative: Fever, vomiting, melena  Physical Exam  BP (!) 124/58 (BP Location: Right Arm)   Pulse 79   Temp 97.7 F (36.5 C) (Oral)   Resp 17   SpO2 99%  Gen:   Awake, no distress   Resp:  Normal effort  MSK:   Moves extremities without difficulty Other  Conjunctival pallor.   Medical Decision Making  Medically screening exam initiated at 4:57 PM.  Appropriate orders placed.  KAMALI MENG was informed that the remainder of the evaluation will be completed by another provider, this initial triage assessment does not replace that evaluation, and the importance of remaining in the ED until their evaluation is complete.  Fatigue    Amaryllis Dyke, PA-C 03/22/21 1701    Charlesetta Shanks, MD 03/25/21 [phone]

## 2021-03-22 NOTE — Progress Notes (Signed)
Pt called to cancel lab and blood appointment for tomorrow. Pt states he is not feeling well Pt states he started feeling bad yesterday. Pt had diarrhea last pm. Pt states he is unsteady on his feet and has a headache. Pt states generalized weakness. Encouraged pt to go to Urgent Care or call PCP. Pt's speech sounded slow to this RN.  Contacted pt 15 minutes later and pt confirmed he was going to Urgent Care.

## 2021-03-23 ENCOUNTER — Inpatient Hospital Stay: Payer: Medicare HMO

## 2021-03-23 ENCOUNTER — Encounter (HOSPITAL_COMMUNITY): Payer: Self-pay | Admitting: Internal Medicine

## 2021-03-23 DIAGNOSIS — D649 Anemia, unspecified: Secondary | ICD-10-CM

## 2021-03-23 LAB — BASIC METABOLIC PANEL
Anion gap: 5 (ref 5–15)
BUN: 16 mg/dL (ref 8–23)
CO2: 27 mmol/L (ref 22–32)
Calcium: 8.6 mg/dL — ABNORMAL LOW (ref 8.9–10.3)
Chloride: 104 mmol/L (ref 98–111)
Creatinine, Ser: 0.46 mg/dL — ABNORMAL LOW (ref 0.61–1.24)
GFR, Estimated: >60 mL/min (ref >60)
Glucose, Bld: 219 mg/dL — ABNORMAL HIGH (ref 70–99)
Potassium: 3.7 mmol/L (ref 3.5–5.1)
Sodium: 136 mmol/L (ref 135–145)

## 2021-03-23 LAB — CBC
HCT: 21.7 % — ABNORMAL LOW (ref 39.0–52.0)
Hemoglobin: 7.2 g/dL — ABNORMAL LOW (ref 13.0–17.0)
MCH: 29.6 pg (ref 26.0–34.0)
MCHC: 33.2 g/dL (ref 30.0–36.0)
MCV: 89.3 fL (ref 80.0–100.0)
Platelets: 267 10*3/uL (ref 150–400)
RBC: 2.43 MIL/uL — ABNORMAL LOW (ref 4.22–5.81)
RDW: 16 % — ABNORMAL HIGH (ref 11.5–15.5)
WBC: 10.9 10*3/uL — ABNORMAL HIGH (ref 4.0–10.5)
nRBC: 0 % (ref 0.0–0.2)

## 2021-03-23 LAB — CBG MONITORING, ED: Glucose-Capillary: 158 mg/dL — ABNORMAL HIGH (ref 70–99)

## 2021-03-23 MED ORDER — INSULIN ASPART 100 UNIT/ML IJ SOLN
0.0000 [IU] | Freq: Three times a day (TID) | INTRAMUSCULAR | Status: DC
  Filled 2021-03-23: qty 0.09

## 2021-03-23 MED ORDER — FOLIC ACID 1 MG PO TABS
1.0000 mg | ORAL_TABLET | Freq: Every day | ORAL | Status: DC
  Filled 2021-03-23: qty 1

## 2021-03-23 MED ORDER — OXYBUTYNIN CHLORIDE 5 MG PO TABS
5.0000 mg | ORAL_TABLET | Freq: Every day | ORAL | Status: DC
  Filled 2021-03-23: qty 1

## 2021-03-23 MED ORDER — DOXYCYCLINE HYCLATE 100 MG PO TABS
100.0000 mg | ORAL_TABLET | Freq: Every day | ORAL | Status: DC
  Administered 2021-03-23: 100 mg via ORAL
  Filled 2021-03-23: qty 1

## 2021-03-23 MED ORDER — INSULIN GLARGINE-LIXISENATIDE 100-33 UNT-MCG/ML ~~LOC~~ SOPN: 23.0000 [IU] | PEN_INJECTOR | Freq: Every day | SUBCUTANEOUS | Status: DC

## 2021-03-23 MED ORDER — BUDESONIDE 3 MG PO CPEP
9.0000 mg | ORAL_CAPSULE | Freq: Every day | ORAL | Status: DC
  Filled 2021-03-23: qty 3

## 2021-03-23 MED ORDER — AMIODARONE HCL 200 MG PO TABS
200.0000 mg | ORAL_TABLET | Freq: Every day | ORAL | Status: DC
  Administered 2021-03-23: 200 mg via ORAL
  Filled 2021-03-23: qty 1

## 2021-03-23 MED ORDER — PANTOPRAZOLE SODIUM 40 MG PO TBEC
40.0000 mg | DELAYED_RELEASE_TABLET | Freq: Every day | ORAL | Status: DC
  Administered 2021-03-23: 40 mg via ORAL
  Filled 2021-03-23: qty 1

## 2021-03-23 MED ORDER — ACETAMINOPHEN 650 MG RE SUPP: 650.0000 mg | Freq: Four times a day (QID) | RECTAL | Status: DC | PRN

## 2021-03-23 MED ORDER — METOPROLOL TARTRATE 25 MG PO TABS
50.0000 mg | ORAL_TABLET | Freq: Two times a day (BID) | ORAL | Status: DC
  Administered 2021-03-23: 50 mg via ORAL
  Filled 2021-03-23 (×2): qty 2

## 2021-03-23 MED ORDER — FUROSEMIDE 40 MG PO TABS
20.0000 mg | ORAL_TABLET | Freq: Every morning | ORAL | Status: DC
  Filled 2021-03-23: qty 1

## 2021-03-23 MED ORDER — VITAMIN B-12 5000 MCG SL SUBL: 5000.0000 ug | SUBLINGUAL_TABLET | Freq: Every day | SUBLINGUAL | Status: DC

## 2021-03-23 MED ORDER — INSULIN GLARGINE-YFGN 100 UNIT/ML ~~LOC~~ SOLN
23.0000 [IU] | Freq: Every day | SUBCUTANEOUS | Status: DC
  Administered 2021-03-23: 23 [IU] via SUBCUTANEOUS
  Filled 2021-03-23: qty 0.23

## 2021-03-23 MED ORDER — VITAMIN B-12 1000 MCG PO TABS
5000.0000 ug | ORAL_TABLET | Freq: Every day | ORAL | Status: DC
  Filled 2021-03-23: qty 5

## 2021-03-23 MED ORDER — ACETAMINOPHEN 325 MG PO TABS: 650.0000 mg | ORAL_TABLET | Freq: Four times a day (QID) | ORAL | Status: DC | PRN

## 2021-03-23 MED ORDER — ASPIRIN EC 325 MG PO TBEC
325.0000 mg | DELAYED_RELEASE_TABLET | ORAL | Status: DC
  Filled 2021-03-23: qty 1

## 2021-03-23 NOTE — Discharge Summary (Signed)
Physician Discharge Summary  Tommy Peterson [id number] DOB: 05-Oct-1940 DOA: 03/22/2021  PCP: [name], DO  Admit date: 03/22/2021 Discharge date: 03/23/2021  Time spent: 60 minutes  Recommendations for Outpatient Follow-up:  Follow-up oncology as outpatient  Discharge Diagnoses:  Principal Problem:   Symptomatic anemia Active Problems:   Colitis, collagenous   Essential hypertension   Large granular lymphocytic leukemia (Tommy Peterson)   Diabetes mellitus with complication (Tommy Peterson)   Paroxysmal atrial fibrillation (Tommy Peterson)   Discharge Condition: Stable  Diet recommendation: Regular diet  There were no vitals filed for this visit.  History of present illness:  80 year old male with a history of T-cell leukemia, blood transfusion dependent followed by oncology Dr. Irene Limbo, presented to ED with worsening exertional dyspnea and weakness.  No chest pain.  He was found to have hemoglobin of 5.4.  Hemoglobin decreased from 8 about 2 weeks ago.  Hospital Course:  Symptomatic anemia -History of T-cell leukemia, blood transfusion dependent -S/p 2 units PRBC -Hemoglobin improved to 7.2 this morning  History of paroxysmal atrial fibrillation -Patient is on amiodarone, metoprolol -Eliquis was recently stopped due to history of falls  History of CHF -Continue Lasix 20 mg p.o. daily  Diabetes mellitus type 2 -Patient is on long-acting insulin 23 units at bedtime  Collagenous colitis -Continue Entocort  Procedures:   Consultations:   Discharge Exam: Vitals:   03/23/21 0730 03/23/21 0740  BP: 139/75   Pulse: (!) 48 (!) 51  Resp: 13 16  Temp:    SpO2: 99% 97%    General: Appears in no acute distress Cardiovascular: S1-S2, regular Respiratory: Clear to auscultation bilaterally  Discharge Instructions   Discharge Instructions     Diet - low sodium heart healthy   Complete by: As directed    Increase activity slowly   Complete by: As directed       Allergies as of  03/23/2021       Reactions   Bee Venom Swelling, Anaphylaxis   Metformin And Related Nausea And Vomiting   Sulfamethoxazole-trimethoprim Nausea And Vomiting   Bee Pollen Other (See Comments)   Unknown   Metformin Hcl Nausea And Vomiting, Other (See Comments)   Made the patient feel sick        Medication List     TAKE these medications    acetaminophen 325 MG tablet Commonly known as: TYLENOL Take 650 mg by mouth 2 (two) times daily as needed for fever.   amiodarone 200 MG tablet Commonly known as: PACERONE Take 1 tablet (200 mg total) by mouth daily. What changed: when to take this   aspirin 325 MG EC tablet Take 325 mg by mouth 3 (three) times a week.   B COMPLEX 100 PO Take 1 tablet by mouth daily.   BD Pen Needle Nano 2nd Gen 32G X 4 MM Misc Generic drug: Insulin Pen Needle   budesonide 3 MG 24 hr capsule Commonly known as: ENTOCORT EC Take 9 mg by mouth in the morning.   Cranberry 400 MG Tabs Take 400 mg by mouth 2 (two) times daily.   D-Mannose 500 MG Caps Take 500 mg by mouth in the morning and at bedtime.   doxycycline 100 MG capsule Commonly known as: VIBRAMYCIN Take 1 capsule (100 mg total) by mouth at bedtime.   folic acid 1 MG tablet Commonly known as: FOLVITE TAKE 1 TABLET BY MOUTH EVERY DAY What changed: when to take this   FreeStyle Libre 2 Sensor Misc Inject 1 patch into the skin  every 14 (fourteen) days.   furosemide 20 MG tablet Commonly known as: LASIX Take 20 mg for a weight 159 or below. Take 40 mg (2 tablets) for a weight over 160 lbs. What changed:  how much to take how to take this when to take this additional instructions   metoprolol tartrate 50 MG tablet Commonly known as: LOPRESSOR Take 50 mg by mouth in the morning and at bedtime.   NovoLOG FlexPen 100 UNIT/ML FlexPen Generic drug: insulin aspart Inject 5-10 Units into the skin See admin instructions. Inject 5-10 units into the skin three times a day with meals, PER  SLIDING SCALE   omeprazole 40 MG capsule Commonly known as: PRILOSEC Take 40 mg by mouth at bedtime.   oxybutynin 5 MG tablet Commonly known as: DITROPAN Take 5 mg by mouth in the morning.   pioglitazone 15 MG tablet Commonly known as: ACTOS Take 15 mg by mouth every morning.   Prostate Health Caps Take 1 capsule by mouth daily with breakfast.   Soliqua 100-33 UNT-MCG/ML Sopn Generic drug: Insulin Glargine-Lixisenatide Inject 23 Units into the skin at bedtime.   Vitamin B-12 5000 MCG Subl Place 5,000 mcg under the tongue daily.       Allergies  Allergen Reactions   Bee Venom Swelling and Anaphylaxis   Metformin And Related Nausea And Vomiting   Sulfamethoxazole-Trimethoprim Nausea And Vomiting   Bee Pollen Other (See Comments)    Unknown    Metformin Hcl Nausea And Vomiting and Other (See Comments)    Made the patient feel sick       The results of significant diagnostics from this hospitalization (including imaging, microbiology, ancillary and laboratory) are listed below for reference.    Significant Diagnostic Studies: MR ANGIO HEAD WO CONTRAST  Result Date: 02/25/2021 CLINICAL DATA:  Neuro deficit, acute stroke suspected. EXAM: MRA NECK WITHOUT AND WITH CONTRAST MRA HEAD WITHOUT CONTRAST TECHNIQUE: Multiplanar and multiecho pulse sequences of the neck were obtained without and with intravenous contrast. Angiographic images of the neck were obtained using MRA technique without and with intravenous contrast; Angiographic images of the Circle of Willis were obtained using MRA technique without intravenous contrast. CONTRAST:  7.58m GADAVIST GADOBUTROL 1 MMOL/ML IV SOLN COMPARISON:  None. FINDINGS: MRA NECK FINDINGS Aorta: Great vessel origins appear patent on postcontrast imaging. Carotid system: Right common carotid artery and internal carotid arteries are patent without evidence of significant stenosis. Left common carotid artery is patent. Moderate stenosis of the  left proximal internal carotid artery at its origin. Vertebral arteries: Codominant. Likely moderate left vertebral artery origin stenosis. The remainder of the vertebral arteries are patent bilaterally not additional significant stenosis. MRA HEAD FINDINGS Anterior circulation: Bilateral intracranial ICAs, MCAs, and ACAs are patent without proximal hemodynamically significant stenosis. Posterior circulation: Bilateral intradural vertebral arteries, basilar artery, and posterior cerebral arteries are patent. Possibly severe left P2 PCA stenosis. IMPRESSION: MRA Head: 1. No large vessel occlusion. 2. Possibly severe left P2 PCA stenosis. MRA Neck: 1. Moderate left ICA origin stenosis. 2. Likely moderate left vertebral artery origin stenosis. Electronically Signed   By: FMargaretha SheffieldMD   On: 02/25/2021 17:52   MR ANGIO NECK W WO CONTRAST  Result Date: 02/25/2021 CLINICAL DATA:  Neuro deficit, acute stroke suspected. EXAM: MRA NECK WITHOUT AND WITH CONTRAST MRA HEAD WITHOUT CONTRAST TECHNIQUE: Multiplanar and multiecho pulse sequences of the neck were obtained without and with intravenous contrast. Angiographic images of the neck were obtained using MRA technique without and with  intravenous contrast; Angiographic images of the Circle of Willis were obtained using MRA technique without intravenous contrast. CONTRAST:  7.25m GADAVIST GADOBUTROL 1 MMOL/ML IV SOLN COMPARISON:  None. FINDINGS: MRA NECK FINDINGS Aorta: Great vessel origins appear patent on postcontrast imaging. Carotid system: Right common carotid artery and internal carotid arteries are patent without evidence of significant stenosis. Left common carotid artery is patent. Moderate stenosis of the left proximal internal carotid artery at its origin. Vertebral arteries: Codominant. Likely moderate left vertebral artery origin stenosis. The remainder of the vertebral arteries are patent bilaterally not additional significant stenosis. MRA HEAD  FINDINGS Anterior circulation: Bilateral intracranial ICAs, MCAs, and ACAs are patent without proximal hemodynamically significant stenosis. Posterior circulation: Bilateral intradural vertebral arteries, basilar artery, and posterior cerebral arteries are patent. Possibly severe left P2 PCA stenosis. IMPRESSION: MRA Head: 1. No large vessel occlusion. 2. Possibly severe left P2 PCA stenosis. MRA Neck: 1. Moderate left ICA origin stenosis. 2. Likely moderate left vertebral artery origin stenosis. Electronically Signed   By: FMargaretha SheffieldMD   On: 02/25/2021 17:52   DG Chest Port 1 View  Result Date: 03/22/2021 CLINICAL DATA:  Shortness of breath and fatigue. EXAM: PORTABLE CHEST 1 VIEW COMPARISON:  01/21/2021 FINDINGS: Findings the cardiac silhouette, mediastinal and hilar contours are within normal limits and stable. Stable left-sided loop recorder. Stable emphysematous changes and pulmonary scarring but no acute overlying pulmonary process. IMPRESSION: Emphysematous changes and pulmonary scarring but no acute overlying pulmonary process. Electronically Signed   By: PMarijo SanesM.D.   On: 03/22/2021 21:19   VAS [place]OWER EXTREMITY VENOUS (DVT)  Result Date: 03/19/2021  Lower Venous DVT Study Patient Name:  Tommy Peterson Date of Exam:   03/18/2021 Medical Rec #: [id number]         Accession #:    [id number] of Birth: [birth year]          Patient Gender: M Patient Age:   89years [facility]      V[facility] [place]OWER EXTREMITY VENOUS (DVT) Referring Phys: GSullivan Lone--------------------------------------------------------------------------------  Indications: Edema.  Limitations: Poor ultrasound/tissue interface. Comparison Study: No prior study Performing Technologist: MMaudry MayhewMHA, RDMS, RVT, RDCS  Examination Guidelines: A complete evaluation includes B-mode imaging, spectral Doppler, color Doppler, and power Doppler as needed of all accessible portions of  each vessel. Bilateral testing is considered an integral part of a complete examination. Limited examinations for reoccurring indications may be performed as noted. The reflux portion of the exam is performed with the patient in reverse Trendelenburg.  +---------+---------------+---------+-----------+----------+--------------+ RIGHT    CompressibilityPhasicitySpontaneityPropertiesThrombus Aging +---------+---------------+---------+-----------+----------+--------------+ CFV      Full           Yes      Yes                                 +---------+---------------+---------+-----------+----------+--------------+ SFJ      Full                                                        +---------+---------------+---------+-----------+----------+--------------+ FV Prox  Full                                                        +---------+---------------+---------+-----------+----------+--------------+  FV Mid   Full                                                        +---------+---------------+---------+-----------+----------+--------------+ FV DistalFull                                                        +---------+---------------+---------+-----------+----------+--------------+ PFV      Full                                                        +---------+---------------+---------+-----------+----------+--------------+ POP      Full           Yes      Yes                                 +---------+---------------+---------+-----------+----------+--------------+ PTV      Full                                                        +---------+---------------+---------+-----------+----------+--------------+ PERO     Full                                                        +---------+---------------+---------+-----------+----------+--------------+   +---------+---------------+---------+-----------+----------+--------------+ LEFT      CompressibilityPhasicitySpontaneityPropertiesThrombus Aging +---------+---------------+---------+-----------+----------+--------------+ CFV      Full           Yes      Yes                                 +---------+---------------+---------+-----------+----------+--------------+ SFJ      Full                                                        +---------+---------------+---------+-----------+----------+--------------+ FV Prox  Full                                                        +---------+---------------+---------+-----------+----------+--------------+ FV Mid   Full                                                        +---------+---------------+---------+-----------+----------+--------------+  FV DistalFull                                                        +---------+---------------+---------+-----------+----------+--------------+ PFV      Full                                                        +---------+---------------+---------+-----------+----------+--------------+ POP      Full           Yes      Yes                                 +---------+---------------+---------+-----------+----------+--------------+ PTV      Full                                                        +---------+---------------+---------+-----------+----------+--------------+ PERO     Full                                                        +---------+---------------+---------+-----------+----------+--------------+     Summary: BILATERAL: - No evidence of deep vein thrombosis seen in the lower extremities, bilaterally. -No evidence of popliteal cyst, bilaterally.   *See table(s) above for measurements and observations. Electronically signed by Harold Barban MD on 03/19/2021 at 1:20:53 PM.    Final     Microbiology: Recent Results (from the past 240 hour(s))  Resp Panel by RT-PCR (Flu A&B, Covid) Nasopharyngeal Swab     Status: None    Collection Time: 03/22/21  5:42 PM   Specimen: Nasopharyngeal Swab; Nasopharyngeal(NP) swabs in vial transport medium  Result Value Ref Range Status   SARS Coronavirus 2 by RT PCR NEGATIVE NEGATIVE Final    Comment: (NOTE) SARS-CoV-2 target nucleic acids are NOT DETECTED.  The SARS-CoV-2 RNA is generally detectable in upper respiratory specimens during the acute phase of infection. The lowest concentration of SARS-CoV-2 viral copies this assay can detect is 138 copies/mL. A negative result does not preclude SARS-Cov-2 infection and should not be used as the sole basis for treatment or other patient management decisions. A negative result may occur with  improper specimen collection/handling, submission of specimen other than nasopharyngeal swab, presence of viral mutation(s) within the areas targeted by this assay, and inadequate number of viral copies(<138 copies/mL). A negative result must be combined with clinical observations, patient history, and epidemiological information. The expected result is Negative.  Fact Sheet for Patients:  EntrepreneurPulse.com.au  Fact Sheet for Healthcare Providers:  IncredibleEmployment.be  This test is no t yet approved or cleared by the [place] FDA and  has been authorized for detection and/or diagnosis of SARS-CoV-2 by FDA under an Emergency Use Authorization (EUA). This EUA will remain  in effect (meaning this test can be used)  for the duration of the COVID-19 declaration under Section 564(b)(1) of the Act, 21 U.S.C.section 360bbb-3(b)(1), unless the authorization is terminated  or revoked sooner.       Influenza A by PCR NEGATIVE NEGATIVE Final   Influenza B by PCR NEGATIVE NEGATIVE Final    Comment: (NOTE) The Xpert Xpress SARS-CoV-2/FLU/RSV plus assay is intended as an aid in the diagnosis of influenza from Nasopharyngeal swab specimens and should not be used as a sole basis for treatment.  Nasal washings and aspirates are unacceptable for Xpert Xpress SARS-CoV-2/FLU/RSV testing.  Fact Sheet for Patients: EntrepreneurPulse.com.au  Fact Sheet for Healthcare Providers: IncredibleEmployment.be  This test is not yet approved or cleared by the [place] FDA and has been authorized for detection and/or diagnosis of SARS-CoV-2 by FDA under an Emergency Use Authorization (EUA). This EUA will remain in effect (meaning this test can be used) for the duration of the COVID-19 declaration under Section 564(b)(1) of the Act, 21 U.S.C. section 360bbb-3(b)(1), unless the authorization is terminated or revoked.  Performed at [facility], [place] [address], [place], [place] [address]      Labs: Basic Metabolic Panel: Recent Labs  Lab 03/22/21 1740 03/23/21 0552  NA 136 136  K 3.9 3.7  CL 101 104  CO2 27 27  GLUCOSE 209* 219*  BUN 18 16  CREATININE 0.45* 0.46*  CALCIUM 8.7* 8.6*   Liver Function Tests: Recent Labs  Lab 03/22/21 1740  AST 20  ALT 38  ALKPHOS 73  BILITOT 1.2  PROT 6.2*  ALBUMIN 3.0*   No results for input(s): LIPASE, AMYLASE in the last 168 hours. No results for input(s): AMMONIA in the last 168 hours. CBC: Recent Labs  Lab 03/22/21 1740 03/23/21 0552  WBC 11.2* 10.9*  NEUTROABS 7.3  --   HGB 5.4* 7.2*  HCT 16.6* 21.7*  MCV 91.7 89.3  PLT 328 267   Cardiac Enzymes: No results for input(s): CKTOTAL, CKMB, CKMBINDEX, TROPONINI in the last 168 hours. BNP: BNP (last 3 results) Recent Labs    11/06/20 0450  BNP 389.9*        Signed:  Oswald Hillock MD.  [facility] 03/23/2021, 8:48 AM

## 2021-03-23 NOTE — H&P (Signed)
History and Physical    Tommy Peterson PNT:[id number] DOB: Jan 06, 1941 DOA: 03/22/2021  PCP: [name], DO  Patient coming from: Home.  Chief Complaint: Weakness.  HPI: Tommy Peterson is a 80 y.o. male with history of T-cell leukemia transfusion dependent and follows with oncologist Dr. Irene Limbo presents to the ER because of increasing weakness exertional symptoms of shortness of breath but denies chest pain.  Denies any falls.  Patient usually gets blood transfusion every 2 weeks.  Denies fever chills nausea vomiting abdominal pain.  Has diarrhea from collagenous colitis for which patient takes Entocort.  ED Course: In the ER patient had lab work done which showed hemoglobin of 5.4 which had decreased from 8 about 2 weeks ago.  Blood glucose was 209 EKG unremarkable chest x-ray does not show any acute patient admitted for symptomatic anemia.  COVID test negative.  Review of Systems: As per HPI, rest all negative.   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--  treatment weekly methotraxate and transfusion's (last PRBCs 07-19-2017)   Collagenous colitis    chronic--- intermittant between diarrahea and constipation   ED (erectile dysfunction)    HCAP (healthcare-associated pneumonia)    History of adenomatous polyp of colon    History of Clostridium difficile colitis 05/2016   History of pneumonia 11/08/2017   CAP -- LLL---  12-19-2017  per  pt no residual symptoms   History of SCC (squamous cell carcinoma) of skin    History of sepsis    11-20-2017 severe sepsis due to UTI;  10/ 2017sepsis due to c-diff colitis   Leucocytosis    chronic   Lower urinary tract symptoms (LUTS)    Macrocytic anemia    since 02/ 2015   MGUS (monoclonal gammopathy of unknown significance)    hemotologist-  [name] @ [facility]   Monoclonal paraproteinemia    Neuropathy, peripheral    Nodular basal cell carcinoma (BCC) 10/01/2018   Left Neck(Nodular) Pt Does Not Want Treatment   Nodular basal cell carcinoma (BCC) 10/01/2018   Mid Forehead (treament curet and excision)   OA (osteoarthritis)    Prostatic stone    Rash of face    RIGHT SIDE   Raynaud's syndrome    Recurrent BCC (basal cell carcinoma) 03/05/2019   positive margin   SCC (squamous cell carcinoma) 08/10/2009   Central Forehead (Moh's Dr. Rhona Raider @ [facility])   SCC (squamous cell carcinoma) 12/08/2011   CIS-Left Cheek (treatment Aldara @ [facility])   SCC (squamous cell carcinoma) 07/09/2015   CIS-Left Forehead (treament @ [facility])   Septic shock ([place])    x 3 w/UTI   Squamous cell carcinoma in situ (SCCIS) 12/08/2011   Left Cheek (treatment [facility] @ [facility])    Superficial basal cell carcinoma ([place]) 10/01/2018   Left Shin-Pt does not want treatment   Thin skin    fragile   Transfusion-dependent anemia since 10/ 2017  last transfusion PRBCs 07-19-2017  per hemologist note dated 10-25-2017   Type 2 diabetes mellitus treated with insulin ([place])    followed by [name] (pcp)   Urinary hesitancy    Wears contact lenses    LEFT EYE ONLY    Past Surgical History:  Procedure Laterality Date   BONE MARROW BIOPSY Right 05-24-2016;  08-20-2014   CARDIOVASCULAR STRESS TEST  11-26-2012   [name]  @ [place]   normal nuclear study w/ no ischemia/  normal LV function and wall motion , ef 60% (find in care everywhere,epic)   CARDIOVERSION N/A 08/31/2020    Procedure: CARDIOVERSION;  Surgeon: [name], MD;  Location: [place];  Service: Cardiovascular;  Laterality: N/A;   CATARACT EXTRACTION W/ INTRAOCULAR LENS  IMPLANT, BILATERAL  2015   CYSTOSCOPY WITH LITHOLAPAXY N/A 12/25/2017   Procedure: CYSTOSCOPY WITH LITHOLAPAXY AND FULGERATION;  Surgeon: [name], MD;  Location: [facility];  Service: Urology;  Laterality: N/A;   INGUINAL HERNIA REPAIR Right 1980s   LOOP RECORDER INSERTION N/A 04/10/2018   Procedure: LOOP RECORDER INSERTION;  Surgeon: [name], MD;  Location: [place] [facility];  Service: Cardiovascular;  Laterality: N/A;   PENILE PROSTHESIS IMPLANT  12-02-2015   dr [name] @ [place] in [place] Left 08/2016   TEE WITHOUT CARDIOVERSION N/A 08/31/2020   Procedure: TRANSESOPHAGEAL ECHOCARDIOGRAM (TEE);  Surgeon: [name], MD;  Location: [facility] ENDOSCOPY;  Service: Cardiovascular;  Laterality: N/A;   TONSILLECTOMY  child   TRANSURETHRAL RESECTION OF PROSTATE  2009   AND REPAIR RECURRENT RIGHT INGUINAL HERNIA     reports that he quit smoking about 24 years ago. His smoking use included cigarettes. He has never used smokeless tobacco. He reports current alcohol use. He reports that he does not use drugs.  Allergies  Allergen Reactions   Bee Venom Swelling and Anaphylaxis   Metformin And Related Nausea And Vomiting   Sulfamethoxazole-Trimethoprim Nausea And Vomiting   Bee Pollen Other (See Comments)    Unknown    Metformin Hcl Nausea And Vomiting and Other (See Comments)    Made the patient feel sick     Family History  Problem Relation Age of Onset   Diabetes Mother    Heart failure Mother     Prior to Admission medications   Medication Sig Start Date End Date Taking? Authorizing Provider  acetaminophen (TYLENOL) 325 MG tablet Take 650 mg by mouth 2 (two) times daily as needed for fever.   Yes [provider]  amiodarone (PACERONE) 200 MG  tablet Take 1 tablet (200 mg total) by mouth daily. Patient taking differently: Take 200 mg by mouth at bedtime. 09/29/20  Yes [facility], Tami Lin, PA  aspirin 325 MG EC tablet Take 325 mg by mouth 3 (three) times a week.   Yes [provider]  B Complex Vitamins (B COMPLEX 100 PO) Take 1 tablet by mouth daily.   Yes [provider]  budesonide (ENTOCORT EC) 3 MG 24 hr capsule Take 9 mg by mouth in the morning.   Yes [provider]  Continuous Blood Gluc Sensor (FREESTYLE LIBRE 2 SENSOR) MISC Inject 1 patch into the skin every 14 (fourteen) days. 03/17/21  Yes [provider]  Cranberry 400 MG TABS Take 400 mg by mouth 2 (two) times daily.   Yes [provider]  Cyanocobalamin (VITAMIN B-12) 5000 MCG SUBL Place 5,000 mcg under the tongue  daily.   Yes [provider]  D-Mannose 500 MG CAPS Take 500 mg by mouth in the morning and at bedtime.   Yes [provider]  doxycycline (VIBRAMYCIN) 100 MG capsule Take 1 capsule (100 mg total) by mouth at bedtime. 01/02/21  Yes [name], MD  folic acid (FOLVITE) 1 MG tablet TAKE 1 TABLET BY MOUTH EVERY DAY Patient taking differently: Take 1 mg by mouth daily with breakfast. 07/23/20  Yes Brunetta Genera, MD  furosemide (LASIX) 20 MG tablet Take 20 mg for a weight 159 or below. Take 40 mg (2 tablets) for a weight over 160 lbs. Patient taking differently: Take 20 mg by mouth in the morning. 02/28/21  Yes Croitoru, Mihai, MD  metoprolol tartrate (LOPRESSOR) 50 MG tablet Take 50 mg by mouth in the morning and at bedtime.   Yes [provider]  Misc Natural Products (PROSTATE HEALTH) CAPS Take 1 capsule by mouth daily with breakfast.   Yes [provider]  NOVOLOG FLEXPEN 100 UNIT/ML FlexPen Inject 5-10 Units into the skin See admin instructions. Inject 5-10 units into the skin three times a day with meals, PER SLIDING SCALE   Yes [provider]  omeprazole (PRILOSEC) 40  MG capsule Take 40 mg by mouth at bedtime.   Yes [provider]  oxybutynin (DITROPAN) 5 MG tablet Take 5 mg by mouth in the morning.   Yes [provider]  pioglitazone (ACTOS) 15 MG tablet Take 15 mg by mouth every morning. 10/18/17  Yes [provider]  [place] 100-33 UNT-MCG/ML SOPN Inject 23 Units into the skin at bedtime.   Yes [provider]  BD PEN NEEDLE NANO 2ND GEN 32G X 4 MM MISC  07/22/20   [provider]    Physical Exam: Constitutional: Moderately built and nourished. Vitals:   03/22/21 2230 03/22/21 2300 03/22/21 2330 03/23/21 0000  BP: (!) 148/66 (!) 142/71 (!) 128/59 (!) 136/58  Pulse: 71 64 64 68  Resp: (!) 22 (!) _0 Temp:      TempSrc:      SpO2: 97% 100% 94% 98%   Eyes: Anicteric no pallor. ENMT: No discharge from the ears eyes nose and mouth. Neck: No mass felt.  No neck rigidity. Respiratory: No rhonchi or crepitations. Cardiovascular: S1-S2 heard. Abdomen: Soft nontender bowel sound present. Musculoskeletal: No edema. Skin: No rash. Neurologic: Alert awake oriented to time place and person.  Moves all extremities. Psychiatric: Appears normal.  Normal affect.   Labs on Admission: I have personally reviewed following labs and imaging studies  CBC: Recent Labs  Lab 03/22/21 1740  WBC 11.2*  NEUTROABS 7.3  HGB 5.4*  HCT 16.6*  MCV 91.7  PLT 341   Basic Metabolic Panel: Recent Labs  Lab 03/22/21 1740  NA 136  K 3.9  CL 101  CO2 27  GLUCOSE 209*  BUN 18  CREATININE 0.45*  CALCIUM 8.7*   GFR: Estimated Creatinine Clearance: 74.9 mL/min (A) (by C-G formula based on SCr of 0.45 mg/dL (L)). Liver Function Tests: Recent Labs  Lab 03/22/21 1740  AST 20  ALT 38  ALKPHOS 73  BILITOT 1.2  PROT 6.2*  ALBUMIN 3.0*   No results for input(s): LIPASE, AMYLASE in the last 168 hours. No results for input(s): AMMONIA in the last 168 hours. Coagulation Profile: No results for input(s): INR,  PROTIME in the last 168 hours. Cardiac Enzymes: No results for input(s): CKTOTAL, CKMB, CKMBINDEX, TROPONINI in the  last 168 hours. BNP (last 3 results) No results for input(s): PROBNP in the last 8760 hours. HbA1C: No results for input(s): HGBA1C in the last 72 hours. CBG: No results for input(s): GLUCAP in the last 168 hours. Lipid Profile: No results for input(s): CHOL, HDL, LDLCALC, TRIG, CHOLHDL, LDLDIRECT in the last 72 hours. Thyroid Function Tests: No results for input(s): TSH, T4TOTAL, FREET4, T3FREE, THYROIDAB in the last 72 hours. Anemia Panel: No results for input(s): VITAMINB12, FOLATE, FERRITIN, TIBC, IRON, RETICCTPCT in the last 72 hours. Urine analysis:    Component Value Date/Time   COLORURINE YELLOW 03/22/2021 1701   APPEARANCEUR CLEAR 03/22/2021 1701   LABSPEC 1.011 03/22/2021 1701   PHURINE 6.0 03/22/2021 1701   GLUCOSEU 50 (A) 03/22/2021 1701   HGBUR NEGATIVE 03/22/2021 1701   BILIRUBINUR NEGATIVE 03/22/2021 1701   BILIRUBINUR negative 10/29/2017 1428   KETONESUR NEGATIVE 03/22/2021 1701   PROTEINUR NEGATIVE 03/22/2021 1701   UROBILINOGEN 0.2 10/29/2017 1428   NITRITE NEGATIVE 03/22/2021 1701   LEUKOCYTESUR NEGATIVE 03/22/2021 1701   Sepsis Labs: _0 (procalcitonin:4,lacticidven:4) ) Recent Results (from the past 240 hour(s))  Resp Panel by RT-PCR (Flu A&B, Covid) Nasopharyngeal Swab     Status: None   Collection Time: 03/22/21  5:42 PM   Specimen: Nasopharyngeal Swab; Nasopharyngeal(NP) swabs in vial transport medium  Result Value Ref Range Status   SARS Coronavirus 2 by RT PCR NEGATIVE NEGATIVE Final    Comment: (NOTE) SARS-CoV-2 target nucleic acids are NOT DETECTED.  The SARS-CoV-2 RNA is generally detectable in upper respiratory specimens during the acute phase of infection. The lowest concentration of SARS-CoV-2 viral copies this assay can detect is 138 copies/mL. A negative result does not preclude SARS-Cov-2 infection and should not  be used as the sole basis for treatment or other patient management decisions. A negative result may occur with  improper specimen collection/handling, submission of specimen other than nasopharyngeal swab, presence of viral mutation(s) within the areas targeted by this assay, and inadequate number of viral copies(<138 copies/mL). A negative result must be combined with clinical observations, patient history, and epidemiological information. The expected result is Negative.  Fact Sheet for Patients:  EntrepreneurPulse.com.au  Fact Sheet for Healthcare Providers:  IncredibleEmployment.be  This test is no t yet approved or cleared by the [place] FDA and  has been authorized for detection and/or diagnosis of SARS-CoV-2 by FDA under an Emergency Use Authorization (EUA). This EUA will remain  in effect (meaning this test can be used) for the duration of the COVID-19 declaration under Section 564(b)(1) of the Act, 21 U.S.C.section 360bbb-3(b)(1), unless the authorization is terminated  or revoked sooner.       Influenza A by PCR NEGATIVE NEGATIVE Final   Influenza B by PCR NEGATIVE NEGATIVE Final    Comment: (NOTE) The Xpert Xpress SARS-CoV-2/FLU/RSV plus assay is intended as an aid in the diagnosis of influenza from Nasopharyngeal swab specimens and should not be used as a sole basis for treatment. Nasal washings and aspirates are unacceptable for Xpert Xpress SARS-CoV-2/FLU/RSV testing.  Fact Sheet for Patients: EntrepreneurPulse.com.au  Fact Sheet for Healthcare Providers: IncredibleEmployment.be  This test is not yet approved or cleared by the [place] FDA and has been authorized for detection and/or diagnosis of SARS-CoV-2 by FDA under an Emergency Use Authorization (EUA). This EUA will remain in effect (meaning this test can be used) for the duration of the COVID-19 declaration under Section  564(b)(1) of the Act, 21 U.S.C. section 360bbb-3(b)(1), unless the authorization is terminated  or revoked.  Performed at [facility], [place] [address], [place], [place] [address]      Radiological Exams on Admission: DG Chest Port 1 View  Result Date: 03/22/2021 CLINICAL DATA:  Shortness of breath and fatigue. EXAM: PORTABLE CHEST 1 VIEW COMPARISON:  01/21/2021 FINDINGS: Findings the cardiac silhouette, mediastinal and hilar contours are within normal limits and stable. Stable left-sided loop recorder. Stable emphysematous changes and pulmonary scarring but no acute overlying pulmonary process. IMPRESSION: Emphysematous changes and pulmonary scarring but no acute overlying pulmonary process. Electronically Signed   By: Marijo Sanes M.D.   On: 03/22/2021 21:19    EKG: Independently reviewed.  Normal sinus rhythm.  Assessment/Plan Principal Problem:   Symptomatic anemia Active Problems:   Colitis, collagenous   Essential hypertension   Large granular lymphocytic leukemia (HCC)   Diabetes mellitus with complication (HCC)   Paroxysmal atrial fibrillation (HCC)    Symptomatic anemia with history of T-cell leukemia transfusion dependent hemoglobin at this time is 5.4 we will transfuse 2 units of PRBC and follow CBC. History of paroxysmal atrial fibrillation on amiodarone and metoprolol.  Patient's cardiologist recently stopped patient's Eliquis due to history of falls. History of diastolic CHF on Lasix 20 mg. Diabetes mellitus type 2 with hyperglycemia on long-acting insulin 23 units at bedtime.  We will check sliding scale coverage.  Follow CBGs. Collagenous colitis on Entocort.   DVT prophylaxis: SCDs.  Due to severe anemia avoiding anticoagulation. Code Status: Full code. Family Communication: Discussed with patient. Disposition Plan: Home. Consults called: None. Admission status: Observation.   Rise Patience MD [facility] Pager [phone].  If 7PM-7AM, please contact night-coverage [email or web address] Password [facility]  03/23/2021, 12:46 AM

## 2021-03-24 LAB — BPAM RBC
Blood Product Expiration Date: 202209202359
Blood Product Expiration Date: 202209202359
ISSUE DATE / TIME: 202208162044
ISSUE DATE / TIME: 202208170202
Unit Type and Rh: 5100
Unit Type and Rh: 5100

## 2021-03-24 LAB — TYPE AND SCREEN
ABO/RH(D): O POS
Antibody Screen: NEGATIVE
Unit division: 0
Unit division: 0

## 2021-03-28 ENCOUNTER — Encounter (HOSPITAL_COMMUNITY): Payer: Self-pay

## 2021-03-28 ENCOUNTER — Emergency Department (HOSPITAL_COMMUNITY)
Admission: EM | Admit: 2021-03-28 | Discharge: 2021-03-28 | Disposition: A | Discharge status: Home / Self Care | Payer: No Typology Code available for payment source | Attending: Emergency Medicine | Admitting: Emergency Medicine

## 2021-03-28 ENCOUNTER — Other Ambulatory Visit: Payer: Self-pay

## 2021-03-28 DIAGNOSIS — R739 Hyperglycemia, unspecified: Secondary | ICD-10-CM | POA: Diagnosis not present

## 2021-03-28 DIAGNOSIS — R509 Fever, unspecified: Secondary | ICD-10-CM | POA: Insufficient documentation

## 2021-03-28 DIAGNOSIS — Z5321 Procedure and treatment not carried out due to patient leaving prior to being seen by health care provider: Secondary | ICD-10-CM | POA: Diagnosis not present

## 2021-03-28 LAB — CBG MONITORING, ED: Glucose-Capillary: 288 mg/dL — ABNORMAL HIGH (ref 70–99)

## 2021-03-28 NOTE — ED Provider Notes (Cosign Needed)
Emergency Medicine Provider Triage Evaluation Note  Tommy Peterson , a 80 y.o. male  was evaluated in triage.  Pt complains of fever.  He arrives via EMS from home at the recommendation of his daughters.  He reports fever of 101 at home today, he denies any other symptoms with this.  Denies cough, chest pain or shortness of breath.  No abdominal pain, nausea or vomiting and no urinary symptoms.  Patient is currently followed by Dr. Irene Limbo for Large granular lymphocytic leukemia.  Patient reports that his children made him come to the hospital today that he does not want to be here or be evaluated.  He reports he is calling his daughter for a ride and called and spoke with his daughter while I was in the room.  He is refusing any further care and wants to leave.  Review of Systems  Positive: Fever Negative: cough, chest pain, shortness of breath, abdominal pain, vomiting, diarrhea, dysuria  Physical Exam  BP (!) 124/50 (BP Location: Left Arm)   Pulse 76   Temp 99.6 F (37.6 C) (Oral)   Resp 12   Ht 5' 9.5" (1.765 m)   Wt 69.9 kg   SpO2 100%   BMI 22.42 kg/m  Gen:   Awake, no distress   Resp:  Normal effort  MSK:   Moves extremities without difficulty  Other:    Medical Decision Making  Medically screening exam initiated at 7:32 PM.  Appropriate orders placed.  Tommy Peterson was informed that the remainder of the evaluation will be completed by another provider, this initial triage assessment does not replace that evaluation, and the importance of remaining in the ED until their evaluation is complete.  Patient tells me that he wants to leave he is refusing any further evaluation.  I discussed with patient that given his leukemia history and reported fever at home his oncologist would likely recommend he come to the ED for further evaluation, he reports "I am tired of hospitals.  He is alert, expresses understanding of risks and instruction, but states that he wishes to leave.  Called  his daughter and reports she is coming to pick him up.  Patient will leave [place].  Unable to do lab work or any further evaluation for source of fever.   [name], [place] 03/28/21 [phone]

## 2021-03-29 ENCOUNTER — Telehealth: Payer: Self-pay | Admitting: Cardiovascular Disease

## 2021-03-29 NOTE — Telephone Encounter (Signed)
Spoke to the patient. Appointment made for 9/2 with Dr. Sallyanne Kuster for post hospital follow up

## 2021-03-29 NOTE — Telephone Encounter (Signed)
Patient called and wanted to know about an earlier appt than in Jan or Feb due to health issues he is currently having. Wanted nurse to check to see if she could do anything. Please call back

## 2021-03-31 DIAGNOSIS — E1122 Type 2 diabetes mellitus with diabetic chronic kidney disease: Secondary | ICD-10-CM | POA: Diagnosis not present

## 2021-04-03 ENCOUNTER — Emergency Department (HOSPITAL_COMMUNITY): Payer: No Typology Code available for payment source

## 2021-04-03 ENCOUNTER — Other Ambulatory Visit: Payer: Self-pay

## 2021-04-03 ENCOUNTER — Encounter (HOSPITAL_COMMUNITY): Payer: Self-pay | Admitting: Emergency Medicine

## 2021-04-03 ENCOUNTER — Inpatient Hospital Stay (HOSPITAL_COMMUNITY)
Admission: EM | Admit: 2021-04-03 | Discharge: 2021-04-07 | DRG: 840 | Disposition: A | Discharge status: Home Health | Payer: No Typology Code available for payment source | Attending: Internal Medicine | Admitting: Internal Medicine

## 2021-04-03 DIAGNOSIS — Z8249 Family history of ischemic heart disease and other diseases of the circulatory system: Secondary | ICD-10-CM

## 2021-04-03 DIAGNOSIS — Z20822 Contact with and (suspected) exposure to covid-19: Secondary | ICD-10-CM | POA: Diagnosis present

## 2021-04-03 DIAGNOSIS — D649 Anemia, unspecified: Secondary | ICD-10-CM | POA: Diagnosis present

## 2021-04-03 DIAGNOSIS — E869 Volume depletion, unspecified: Secondary | ICD-10-CM | POA: Diagnosis present

## 2021-04-03 DIAGNOSIS — E871 Hypo-osmolality and hyponatremia: Secondary | ICD-10-CM

## 2021-04-03 DIAGNOSIS — Z794 Long term (current) use of insulin: Secondary | ICD-10-CM

## 2021-04-03 DIAGNOSIS — Z882 Allergy status to sulfonamides status: Secondary | ICD-10-CM

## 2021-04-03 DIAGNOSIS — Z888 Allergy status to other drugs, medicaments and biological substances status: Secondary | ICD-10-CM

## 2021-04-03 DIAGNOSIS — Z7982 Long term (current) use of aspirin: Secondary | ICD-10-CM

## 2021-04-03 DIAGNOSIS — K529 Noninfective gastroenteritis and colitis, unspecified: Secondary | ICD-10-CM | POA: Diagnosis present

## 2021-04-03 DIAGNOSIS — I5032 Chronic diastolic (congestive) heart failure: Secondary | ICD-10-CM | POA: Diagnosis not present

## 2021-04-03 DIAGNOSIS — C9110 Chronic lymphocytic leukemia of B-cell type not having achieved remission: Secondary | ICD-10-CM | POA: Diagnosis present

## 2021-04-03 DIAGNOSIS — D72820 Lymphocytosis (symptomatic): Secondary | ICD-10-CM

## 2021-04-03 DIAGNOSIS — K52831 Collagenous colitis: Secondary | ICD-10-CM

## 2021-04-03 DIAGNOSIS — U071 COVID-19: Secondary | ICD-10-CM

## 2021-04-03 DIAGNOSIS — Z961 Presence of intraocular lens: Secondary | ICD-10-CM | POA: Diagnosis present

## 2021-04-03 DIAGNOSIS — Z85828 Personal history of other malignant neoplasm of skin: Secondary | ICD-10-CM

## 2021-04-03 DIAGNOSIS — Z9109 Other allergy status, other than to drugs and biological substances: Secondary | ICD-10-CM

## 2021-04-03 DIAGNOSIS — I5030 Unspecified diastolic (congestive) heart failure: Secondary | ICD-10-CM

## 2021-04-03 DIAGNOSIS — K52839 Microscopic colitis, unspecified: Secondary | ICD-10-CM | POA: Diagnosis present

## 2021-04-03 DIAGNOSIS — N400 Enlarged prostate without lower urinary tract symptoms: Secondary | ICD-10-CM | POA: Diagnosis present

## 2021-04-03 DIAGNOSIS — I110 Hypertensive heart disease with heart failure: Secondary | ICD-10-CM | POA: Diagnosis present

## 2021-04-03 DIAGNOSIS — Z87440 Personal history of urinary (tract) infections: Secondary | ICD-10-CM

## 2021-04-03 DIAGNOSIS — D472 Monoclonal gammopathy: Secondary | ICD-10-CM | POA: Diagnosis present

## 2021-04-03 DIAGNOSIS — C91Z0 Other lymphoid leukemia not having achieved remission: Secondary | ICD-10-CM | POA: Diagnosis not present

## 2021-04-03 DIAGNOSIS — G629 Polyneuropathy, unspecified: Secondary | ICD-10-CM | POA: Diagnosis present

## 2021-04-03 DIAGNOSIS — Z8616 Personal history of COVID-19: Secondary | ICD-10-CM

## 2021-04-03 DIAGNOSIS — R509 Fever, unspecified: Secondary | ICD-10-CM | POA: Diagnosis present

## 2021-04-03 DIAGNOSIS — K5900 Constipation, unspecified: Secondary | ICD-10-CM | POA: Diagnosis present

## 2021-04-03 DIAGNOSIS — Z9181 History of falling: Secondary | ICD-10-CM

## 2021-04-03 DIAGNOSIS — E1165 Type 2 diabetes mellitus with hyperglycemia: Secondary | ICD-10-CM

## 2021-04-03 DIAGNOSIS — D630 Anemia in neoplastic disease: Secondary | ICD-10-CM | POA: Diagnosis present

## 2021-04-03 DIAGNOSIS — Z833 Family history of diabetes mellitus: Secondary | ICD-10-CM

## 2021-04-03 DIAGNOSIS — G9341 Metabolic encephalopathy: Secondary | ICD-10-CM | POA: Diagnosis present

## 2021-04-03 DIAGNOSIS — J9601 Acute respiratory failure with hypoxia: Secondary | ICD-10-CM

## 2021-04-03 DIAGNOSIS — Z87891 Personal history of nicotine dependence: Secondary | ICD-10-CM

## 2021-04-03 DIAGNOSIS — I480 Paroxysmal atrial fibrillation: Secondary | ICD-10-CM | POA: Diagnosis present

## 2021-04-03 LAB — COMPREHENSIVE METABOLIC PANEL
ALT: 38 U/L (ref 0–44)
AST: 18 U/L (ref 15–41)
Albumin: 2.9 g/dL — ABNORMAL LOW (ref 3.5–5.0)
Alkaline Phosphatase: 64 U/L (ref 38–126)
Anion gap: 7 (ref 5–15)
BUN: 14 mg/dL (ref 8–23)
CO2: 26 mmol/L (ref 22–32)
Calcium: 8.5 mg/dL — ABNORMAL LOW (ref 8.9–10.3)
Chloride: 103 mmol/L (ref 98–111)
Creatinine, Ser: 0.42 mg/dL — ABNORMAL LOW (ref 0.61–1.24)
GFR, Estimated: >60 mL/min (ref >60)
Glucose, Bld: 232 mg/dL — ABNORMAL HIGH (ref 70–99)
Potassium: 3.5 mmol/L (ref 3.5–5.1)
Sodium: 136 mmol/L (ref 135–145)
Total Bilirubin: 1 mg/dL (ref 0.3–1.2)
Total Protein: 6 g/dL — ABNORMAL LOW (ref 6.5–8.1)

## 2021-04-03 LAB — CBC WITH DIFFERENTIAL/PLATELET
Abs Immature Granulocytes: 0.19 10*3/uL — ABNORMAL HIGH (ref 0.00–0.07)
Basophils Absolute: 0 10*3/uL (ref 0.0–0.1)
Basophils Relative: 0 %
Eosinophils Absolute: 0.1 10*3/uL (ref 0.0–0.5)
Eosinophils Relative: 0 %
HCT: 17.5 % — ABNORMAL LOW (ref 39.0–52.0)
Hemoglobin: 5.7 g/dL — CL (ref 13.0–17.0)
Immature Granulocytes: 1 %
Lymphocytes Relative: 45 %
Lymphs Abs: 6.1 10*3/uL — ABNORMAL HIGH (ref 0.7–4.0)
MCH: 30.3 pg (ref 26.0–34.0)
MCHC: 32.6 g/dL (ref 30.0–36.0)
MCV: 93.1 fL (ref 80.0–100.0)
Monocytes Absolute: 1.7 10*3/uL — ABNORMAL HIGH (ref 0.1–1.0)
Monocytes Relative: 12 %
Neutro Abs: 5.7 10*3/uL (ref 1.7–7.7)
Neutrophils Relative %: 42 %
Platelets: 280 10*3/uL (ref 150–400)
RBC: 1.88 MIL/uL — ABNORMAL LOW (ref 4.22–5.81)
RDW: 15.9 % — ABNORMAL HIGH (ref 11.5–15.5)
WBC: 13.7 10*3/uL — ABNORMAL HIGH (ref 4.0–10.5)
nRBC: 0 % (ref 0.0–0.2)

## 2021-04-03 LAB — RESP PANEL BY RT-PCR (FLU A&B, COVID) ARPGX2
Influenza A by PCR: NEGATIVE
Influenza B by PCR: NEGATIVE
SARS Coronavirus 2 by RT PCR: NEGATIVE

## 2021-04-03 LAB — PREPARE RBC (CROSSMATCH)

## 2021-04-03 LAB — URINALYSIS, ROUTINE W REFLEX MICROSCOPIC
Bacteria, UA: NONE SEEN
Bilirubin Urine: NEGATIVE
Glucose, UA: >=500 mg/dL — AB
Hgb urine dipstick: NEGATIVE
Ketones, ur: NEGATIVE mg/dL
Leukocytes,Ua: NEGATIVE
Nitrite: NEGATIVE
Protein, ur: NEGATIVE mg/dL
Specific Gravity, Urine: 1.016 (ref 1.005–1.030)
pH: 7 (ref 5.0–8.0)

## 2021-04-03 LAB — LACTIC ACID, PLASMA: Lactic Acid, Venous: 1.6 mmol/L (ref 0.5–1.9)

## 2021-04-03 IMAGING — DX DG CHEST 1V PORT
1 series · 1 of 1 positions shown · non-contrast
Comparison: Chest x-ray [DATE], CT chest [DATE]

CLINICAL DATA: Weakness.  Fever.  History of leukemia.  Ex-smoker.

EXAM:
PORTABLE CHEST 1 VIEW

[chest ap]
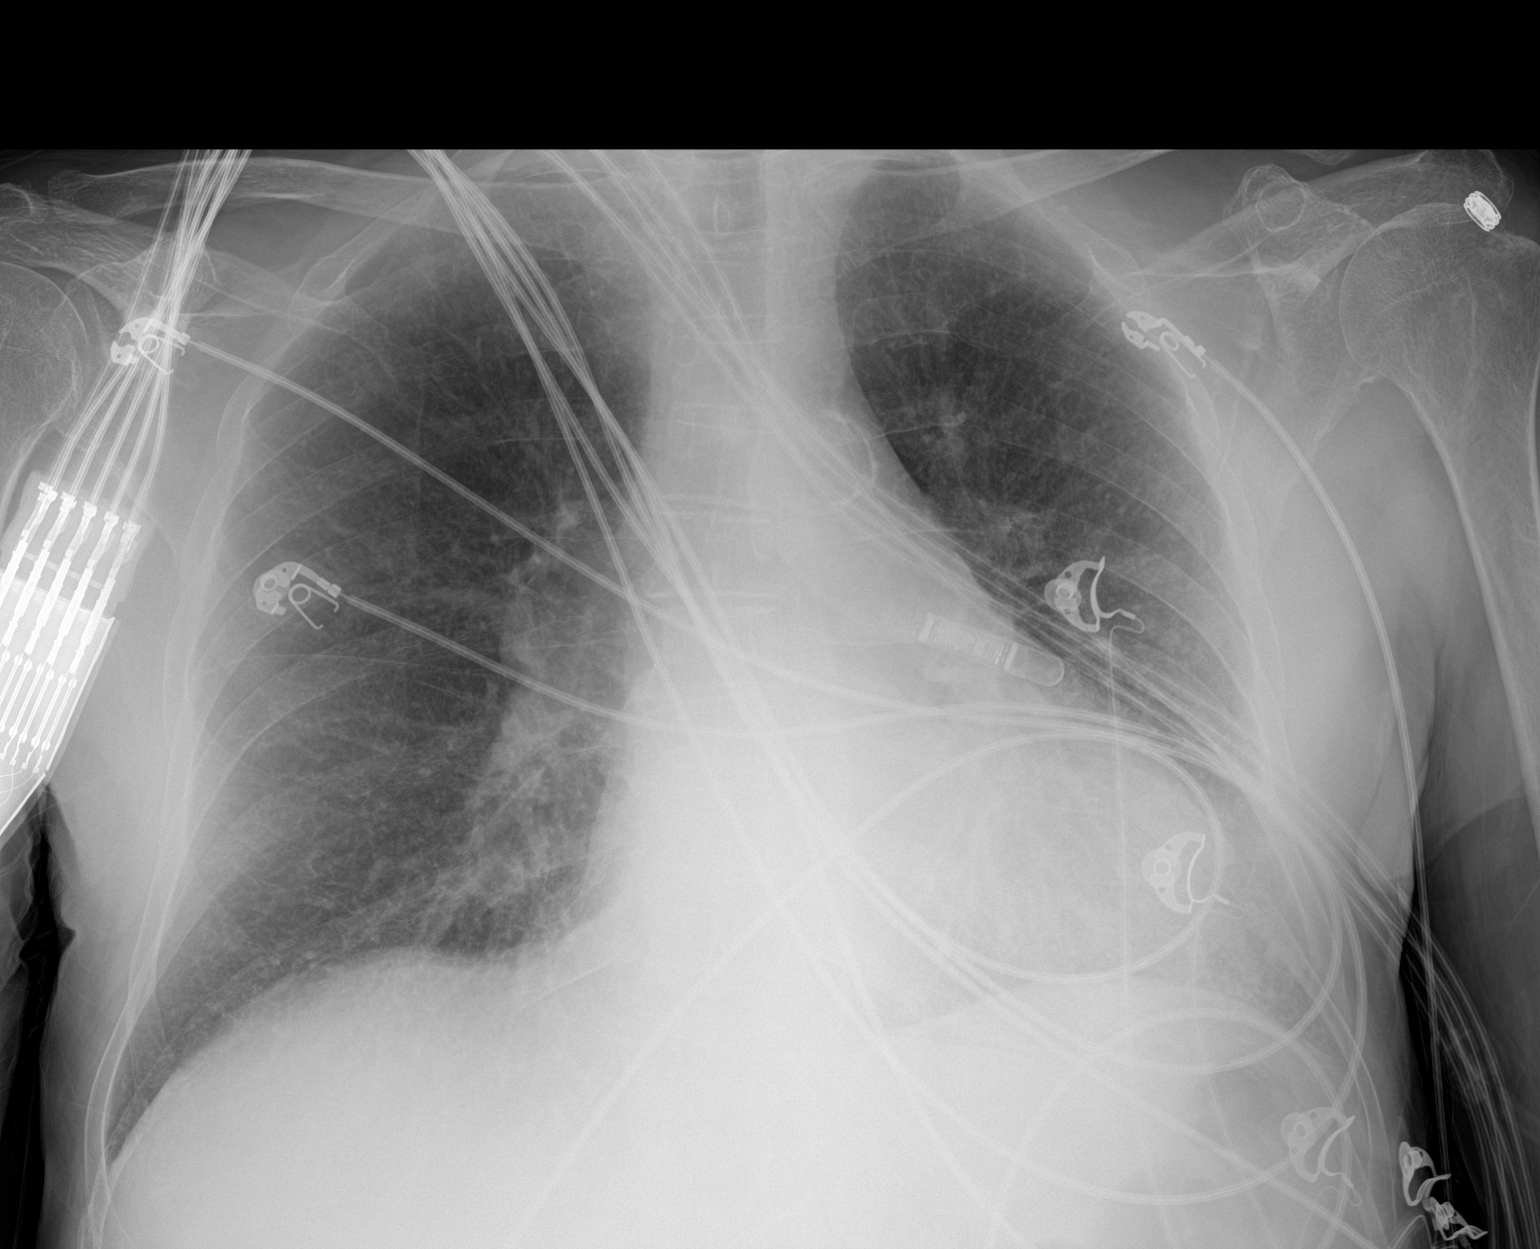

[1 of 1 positions shown; findings below may reference images not displayed]

FINDINGS: Wireless cardiac device overlies the left chest.

The heart and mediastinal contours are unchanged. Aortic
calcification.

No focal consolidation. Coarsened interstitial markings pulmonary
edema. No pleural effusion. No pneumothorax.

No acute osseous abnormality.
IMPRESSION: 1. No active disease.
2. Aortic Atherosclerosis ([OI]-[OI]) and Emphysema ([OI]-[OI]).

## 2021-04-03 MED ORDER — PANTOPRAZOLE SODIUM 40 MG PO TBEC
40.0000 mg | DELAYED_RELEASE_TABLET | Freq: Every day | ORAL | Status: DC
  Administered 2021-04-05 – 2021-04-07 (×3): 40 mg via ORAL
  Filled 2021-04-03 (×4): qty 1

## 2021-04-03 MED ORDER — LACTATED RINGERS IV BOLUS
1000.0000 mL | Freq: Once | INTRAVENOUS | Status: AC
  Administered 2021-04-03: 1000 mL via INTRAVENOUS

## 2021-04-03 MED ORDER — SODIUM CHLORIDE 0.9 % IV SOLN
10.0000 mL/h | Freq: Once | INTRAVENOUS | Status: AC
Start: 2021-04-03 — End: 2021-04-04
  Administered 2021-04-04: 10 mL/h via INTRAVENOUS

## 2021-04-03 MED ORDER — VITAMIN B-12 1000 MCG PO TABS
5000.0000 ug | ORAL_TABLET | Freq: Every day | ORAL | Status: DC
  Administered 2021-04-04 – 2021-04-07 (×4): 5000 ug via ORAL
  Filled 2021-04-03 (×4): qty 5

## 2021-04-03 MED ORDER — AMIODARONE HCL 200 MG PO TABS
200.0000 mg | ORAL_TABLET | Freq: Every day | ORAL | Status: DC
  Administered 2021-04-04 – 2021-04-07 (×4): 200 mg via ORAL
  Filled 2021-04-03 (×4): qty 1

## 2021-04-03 MED ORDER — METOPROLOL TARTRATE 50 MG PO TABS
50.0000 mg | ORAL_TABLET | Freq: Two times a day (BID) | ORAL | Status: DC
  Administered 2021-04-04 – 2021-04-07 (×6): 50 mg via ORAL
  Filled 2021-04-03 (×8): qty 1

## 2021-04-03 MED ORDER — INSULIN ASPART 100 UNIT/ML IJ SOLN
0.0000 [IU] | Freq: Three times a day (TID) | INTRAMUSCULAR | Status: DC
  Administered 2021-04-04: 3 [IU] via SUBCUTANEOUS
  Administered 2021-04-04: 8 [IU] via SUBCUTANEOUS
  Administered 2021-04-04: 5 [IU] via SUBCUTANEOUS
  Administered 2021-04-05: 2 [IU] via SUBCUTANEOUS
  Administered 2021-04-05: 5 [IU] via SUBCUTANEOUS
  Administered 2021-04-05: 3 [IU] via SUBCUTANEOUS
  Filled 2021-04-03: qty 0.15

## 2021-04-03 MED ORDER — BUDESONIDE 3 MG PO CPEP
9.0000 mg | ORAL_CAPSULE | Freq: Every day | ORAL | Status: DC
  Administered 2021-04-04 – 2021-04-07 (×4): 9 mg via ORAL
  Filled 2021-04-03 (×4): qty 3

## 2021-04-03 MED ORDER — FOLIC ACID 1 MG PO TABS
1.0000 mg | ORAL_TABLET | Freq: Every day | ORAL | Status: DC
  Administered 2021-04-04: 1 mg via ORAL
  Filled 2021-04-03: qty 1

## 2021-04-03 MED ORDER — ACETAMINOPHEN 325 MG PO TABS
650.0000 mg | ORAL_TABLET | Freq: Two times a day (BID) | ORAL | Status: DC | PRN
  Administered 2021-04-04: 650 mg via ORAL
  Filled 2021-04-03: qty 2

## 2021-04-03 MED ORDER — OXYBUTYNIN CHLORIDE 5 MG PO TABS
5.0000 mg | ORAL_TABLET | Freq: Every day | ORAL | Status: DC
  Administered 2021-04-04 – 2021-04-07 (×4): 5 mg via ORAL
  Filled 2021-04-03 (×5): qty 1

## 2021-04-03 NOTE — ED Triage Notes (Signed)
Pt to ED via EMS from home c/o generalized weakness and diarrhea over the past 2 weeks; progressively getting worse. Reports fevers up to 102 over the past couple of days. Negative orthostatics. HX leukemia, DM, CBG 405. No medications given by EMS. Last VS :Temp 100.2, 143/87, P 76, 95%RA.

## 2021-04-03 NOTE — H&P (Signed)
History and Physical    Tommy Peterson GMW:[id number] DOB: Apr 17, 1941 DOA: 04/03/2021  PCP: Tommy Morning, DO  Patient coming from: Home  I have personally briefly reviewed patient's old medical records in [place]  Chief Complaint: Fatigue and weakness  HPI: Tommy Peterson is a 80 y.o. male with medical history significant for T-cell large granular lymphocytic leukemia, blood transfusion dependent anemia, MGUS, history of C. difficile, chronic diarrhea, paroxysmal atrial fibrillation previously on Eliquis, HFpEF, hypertension, type 2 diabetes who presents with concerns of weakness and generalized malaise.  Patient was recently hospitalized on 8/16-8/16 for anemia with hemoglobin of 5.4 requiring 2 units PRBC.  He then later presented to the ED on 8/22 for fever but left AMA prior to any lab work.  Patient presents today since he has been feeling "terrible."  States he has been weak and feeling fatigue.  Initially denies that his chronic diarrhea is worse but then later states that he fell yesterday after using the bathroom and then continue to have diarrhea all night which is atypical for him.  Denies any blood in his diarrhea.  When asked about recurrent fever he repeatedly states that he has been having temperature of 4.2 at home..  Continues to say at 4.2 even when asked if it was 104.2.  Reportedly per documentation he was recently treated for UTI with doxycycline. Denies any shortness of breath or cough although had some recurrent coughing during exam.  Denies any chest pain.  ED Course: He was afebrile, mildly hypertensive up to systolic of 440H.  WBC of 13.7.  Hemoglobin of 5.7.  BG of 232 with otherwise unremarkable electrolytes.  Negative UA.  Negative COVID and flu PCR.  X-ray without any acute abnormalities.  Review of Systems: Pertinent positives and negatives as above.  Otherwise patient appears to give contradictory answers at times.  Past Medical History:   Diagnosis Date   Acute respiratory failure ([place])    BCC (basal cell carcinoma of skin) 08/15/2016   w/SCC Left Forehead (treatment @ [facility])   BPH (benign prostatic hyperplasia)    Carpal tunnel syndrome on both sides    Chronic diarrhea    intermittant due to chronic collagenous colitis   Chronic large granular lymphocytic leukemia ([place]) primary hemotologist-  Tommy Tommy Peterson. 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--  treatment weekly methotraxate and transfusion's (last PRBCs 07-19-2017)   Collagenous colitis    chronic--- intermittant between diarrahea and constipation   ED (erectile dysfunction)    HCAP (healthcare-associated pneumonia)    History of adenomatous polyp of colon    History of Clostridium difficile colitis 05/2016   History of pneumonia 11/08/2017   CAP -- LLL---  12-19-2017  per pt no residual symptoms   History of SCC (squamous cell carcinoma) of skin    History of sepsis    11-20-2017 severe sepsis due to UTI;  10/ 2017sepsis due to c-diff colitis   Leucocytosis    chronic   Lower urinary tract symptoms (LUTS)    Macrocytic anemia    since 02/ 2015   MGUS (monoclonal gammopathy of unknown significance)    hemotologist-  Tommy Tommy Peterson @ [facility]   Monoclonal paraproteinemia    Neuropathy, peripheral    Nodular basal cell carcinoma (BCC) 10/01/2018   Left Neck(Nodular) Pt Does Not Want Treatment   Nodular basal  cell carcinoma (BCC) 10/01/2018   Mid Forehead (treament curet and excision)   OA (osteoarthritis)    Prostatic stone    Rash of face    RIGHT SIDE   Raynaud's syndrome    Recurrent BCC (basal cell carcinoma) 03/05/2019   positive margin   SCC (squamous cell carcinoma) 08/10/2009   Central Forehead (Moh's Tommy. Rhona Peterson @ [facility])   SCC (squamous cell carcinoma) 12/08/2011   CIS-Left Cheek (treatment  [facility] @ [facility])   SCC (squamous cell carcinoma) 07/09/2015   CIS-Left Forehead (treament @ [facility])   Septic shock ([place])    x 3 w/UTI   Squamous cell carcinoma in situ (SCCIS) 12/08/2011   Left Cheek (treatment [facility] @ [facility])    Superficial basal cell carcinoma ([place]) 10/01/2018   Left Shin-Pt does not want treatment   Thin skin    fragile   Transfusion-dependent anemia since 10/ 2017   last transfusion PRBCs 07-19-2017  per hemologist note dated 10-25-2017   Type 2 diabetes mellitus treated with insulin ([place])    followed by Tommy Tommy Peterson (pcp)   Urinary hesitancy    Wears contact lenses    LEFT EYE ONLY    Past Surgical History:  Procedure Laterality Date   BONE MARROW BIOPSY Right 05-24-2016;  08-20-2014   CARDIOVASCULAR STRESS TEST  11-26-2012   Tommy Shirlee More  @ [place]   normal nuclear study w/ no ischemia/  normal LV function and wall motion , ef 60% (find in care everywhere,epic)   CARDIOVERSION N/A 08/31/2020   Procedure: CARDIOVERSION;  Surgeon: [name], MD;  Location: [place];  Service: Cardiovascular;  Laterality: N/A;   CATARACT EXTRACTION W/ INTRAOCULAR LENS  IMPLANT, BILATERAL  2015   CYSTOSCOPY WITH LITHOLAPAXY N/A 12/25/2017   Procedure: CYSTOSCOPY WITH LITHOLAPAXY AND FULGERATION;  Surgeon: [name], MD;  Location: [place];  Service: Urology;  Laterality: N/A;   INGUINAL HERNIA REPAIR Right 1980s   LOOP RECORDER INSERTION N/A 04/10/2018   Procedure: LOOP RECORDER INSERTION;  Surgeon: [name], MD;  Location: [place] CV LAB;  Service: Cardiovascular;  Laterality: N/A;   PENILE PROSTHESIS IMPLANT  12-02-2015   Tommy [name] @ [facility] in [place] Left 08/2016   TEE WITHOUT CARDIOVERSION N/A 08/31/2020   Procedure: TRANSESOPHAGEAL ECHOCARDIOGRAM (TEE);  Surgeon: [name], MD;  Location: [facility] ENDOSCOPY;  Service: Cardiovascular;  Laterality: N/A;   TONSILLECTOMY   child   TRANSURETHRAL RESECTION OF PROSTATE  2009   AND REPAIR RECURRENT RIGHT INGUINAL HERNIA     reports that he quit smoking about 24 years ago. His smoking use included cigarettes. He has never used smokeless tobacco. He reports current alcohol use. He reports that he does not use drugs. Social History  Allergies  Allergen Reactions   Bee Venom Swelling and Anaphylaxis   Metformin And Related Nausea And Vomiting   Sulfamethoxazole-Trimethoprim Nausea And Vomiting   Bee Pollen Other (See Comments)    Unknown    Metformin Hcl Nausea And Vomiting and Other (See Comments)    Made the patient feel sick     Family History  Problem Relation Age of Onset   Diabetes Mother    Heart failure Mother      Prior to Admission medications   Medication Sig Start Date End Date Taking? Authorizing Provider  acetaminophen (TYLENOL) 325 MG tablet Take 650 mg by mouth 2 (two) times daily as needed for  fever.    [provider]  amiodarone (PACERONE) 200 MG tablet Take 1 tablet (200 mg total) by mouth daily. 09/29/20   Ledora Bottcher, PA  aspirin 325 MG EC tablet Take 325 mg by mouth 3 (three) times a week.    [provider]  B Complex Vitamins (B COMPLEX 100 PO) Take 1 tablet by mouth daily.    [provider]  BD PEN NEEDLE NANO 2ND GEN 32G X 4 MM MISC  07/22/20   [provider]  budesonide (ENTOCORT EC) 3 MG 24 hr capsule Take 9 mg by mouth in the Peterson.    [provider]  Continuous Blood Gluc Sensor (FREESTYLE LIBRE 2 SENSOR) MISC Inject 1 patch into the skin every 14 (fourteen) days. 03/17/21   [provider]  Cranberry 400 MG TABS Take 400 mg by mouth 2 (two) times daily.    [provider]  Cyanocobalamin (VITAMIN B-12) 5000 MCG SUBL Place 5,000 mcg under the tongue daily.    [provider]  D-Mannose 500 MG CAPS Take 500 mg by mouth in the Peterson and at bedtime.    [provider]  doxycycline  (VIBRAMYCIN) 100 MG capsule Take 1 capsule (100 mg total) by mouth at bedtime. 01/02/21   [name], MD  folic acid (FOLVITE) 1 MG tablet TAKE 1 TABLET BY MOUTH EVERY DAY Patient taking differently: Take 1 mg by mouth daily with breakfast. 07/23/20   Brunetta Genera, MD  furosemide (LASIX) 20 MG tablet Take 20 mg for a weight 159 or below. Take 40 mg (2 tablets) for a weight over 160 lbs. Patient taking differently: Take 20 mg by mouth in the Peterson. 02/28/21   Croitoru, Dani Gobble, MD  metoprolol tartrate (LOPRESSOR) 50 MG tablet Take 50 mg by mouth in the Peterson and at bedtime.    [provider]  Misc Natural Products (PROSTATE HEALTH) CAPS Take 1 capsule by mouth daily with breakfast.    [provider]  NOVOLOG FLEXPEN 100 UNIT/ML FlexPen Inject 5-10 Units into the skin See admin instructions. Inject 5-10 units into the skin three times a day with meals, PER SLIDING SCALE    [provider]  omeprazole (PRILOSEC) 40 MG capsule Take 40 mg by mouth at bedtime.    [provider]  oxybutynin (DITROPAN) 5 MG tablet Take 5 mg by mouth in the Peterson.    [provider]  pioglitazone (ACTOS) 15 MG tablet Take 15 mg by mouth every Peterson. 10/18/17   [provider]  [place] 100-33 UNT-MCG/ML SOPN Inject 23 Units into the skin at bedtime.    [provider]    Physical Exam: Vitals:   04/03/21 1856 04/03/21 1857 04/03/21 2200  BP:  (!) 142/62 (!) 167/65  Pulse:  73 71  Resp:  (!) 22 (!) 21  Temp:  98.4 F (36.9 C)   TempSrc:  Oral   SpO2:  97% 94%  Weight: 69.9 kg    Height: 5' 9.5" (1.765 m)      Constitutional: NAD, calm, comfortable, elderly mildly ill-appearing male laying flat in bed Vitals:   04/03/21 1856 04/03/21 1857 04/03/21 2200  BP:  (!) 142/62 (!) 167/65  Pulse:  73 71  Resp:  (!) 22 (!) 21  Temp:  98.4 F (36.9 C)   TempSrc:  Oral   SpO2:  97% 94%  Weight: 69.9 kg    Height: 5' 9.5" (1.765 m)  Eyes: PERRL, lids and conjunctivae normal ENMT: Mucous membranes are moist.  Neck: normal, supple Respiratory: clear to auscultation bilaterally, no wheezing, no crackles. Normal respiratory effort. No accessory muscle use.  Cardiovascular: Regular rate and rhythm, no murmurs / rubs / gallops. No extremity edema.   Abdomen: Diffuse tenderness with voluntary guarding worse at suprapubic region but did note that he had to urinate, no masses palpated.  Bowel sounds positive.  Musculoskeletal: no clubbing / cyanosis. No joint deformity upper and lower extremities. Good ROM, no contractures. Normal muscle tone.  Skin: no rashes, lesions, ulcers. No induration Neurologic: CN 2-12 grossly intact. Sensation intact,  Strength 5/5 in all 4.  Psychiatric: Questionable judgment.  Periodically will give answers that contradict himself or answer inappropriately. alert and oriented x 3. Normal mood.    Labs on Admission: I have personally reviewed following labs and imaging studies  CBC: Recent Labs  Lab 04/03/21 2120  WBC 13.7*  NEUTROABS 5.7  HGB 5.7*  HCT 17.5*  MCV 93.1  PLT 161   Basic Metabolic Panel: Recent Labs  Lab 04/03/21 2120  NA 136  K 3.5  CL 103  CO2 26  GLUCOSE 232*  BUN 14  CREATININE 0.42*  CALCIUM 8.5*   GFR: Estimated Creatinine Clearance: 72.8 mL/min (A) (by C-G formula based on SCr of 0.42 mg/dL (L)). Liver Function Tests: Recent Labs  Lab 04/03/21 2120  AST 18  ALT 38  ALKPHOS 64  BILITOT 1.0  PROT 6.0*  ALBUMIN 2.9*   No results for input(s): LIPASE, AMYLASE in the last 168 hours. No results for input(s): AMMONIA in the last 168 hours. Coagulation Profile: No results for input(s): INR, PROTIME in the last 168 hours. Cardiac Enzymes: No results for input(s): CKTOTAL, CKMB, CKMBINDEX, TROPONINI in the last 168 hours. BNP (last 3 results) No results for input(s): PROBNP in the last 8760 hours. HbA1C: No results for input(s): HGBA1C in the last 72  hours. CBG: Recent Labs  Lab 03/28/21 1847  GLUCAP 288*   Lipid Profile: No results for input(s): CHOL, HDL, LDLCALC, TRIG, CHOLHDL, LDLDIRECT in the last 72 hours. Thyroid Function Tests: No results for input(s): TSH, T4TOTAL, FREET4, T3FREE, THYROIDAB in the last 72 hours. Anemia Panel: No results for input(s): VITAMINB12, FOLATE, FERRITIN, TIBC, IRON, RETICCTPCT in the last 72 hours. Urine analysis:    Component Value Date/Time   COLORURINE YELLOW 04/03/2021 1932   APPEARANCEUR CLEAR 04/03/2021 1932   LABSPEC 1.016 04/03/2021 1932   PHURINE 7.0 04/03/2021 1932   GLUCOSEU >=500 (A) 04/03/2021 1932   HGBUR NEGATIVE 04/03/2021 1932   BILIRUBINUR NEGATIVE 04/03/2021 1932   BILIRUBINUR negative 10/29/2017 [place] 04/03/2021 1932   PROTEINUR NEGATIVE 04/03/2021 1932   UROBILINOGEN 0.2 10/29/2017 1428   NITRITE NEGATIVE 04/03/2021 1932   LEUKOCYTESUR NEGATIVE 04/03/2021 1932    Radiological Exams on Admission: DG Chest Port 1 View  Result Date: 04/03/2021 CLINICAL DATA:  Weakness.  Fever.  History of leukemia.  Ex-smoker. EXAM: PORTABLE CHEST 1 VIEW COMPARISON:  Chest x-ray 03/22/2021, CT chest 07/29/2018 FINDINGS: Wireless cardiac device overlies the left chest. The heart and mediastinal contours are unchanged. Aortic calcification. No focal consolidation. Coarsened interstitial markings pulmonary edema. No pleural effusion. No pneumothorax. No acute osseous abnormality. IMPRESSION: 1. No active disease. 2. Aortic Atherosclerosis (ICD10-I70.0) and Emphysema (ICD10-J43.9). Electronically Signed   By: Iven Finn M.D.   On: 04/03/2021 20:22      Assessment/Plan  T-cell large granular lymphocytic leukemia -follows with  Tommy. Irene Limbo  -currently on conservation approprach with lab monitoring and supportive transfusion but recent lymphocytes increasing suggestive of gradual progression. Hemo/onc had discussed on 8/3 repeat bone marrow biopsy and starting new  treatment for patient to consider   Symptomatic anemia - Blood transfusion dependent due to history of T-cell leukemia - Hemoglobin of 5.7 from a prior of 7.2 - Transfused 2 units PRBC, last transfused 2 units on 8/16 - Transfusion threshold of hemoglobin less than 7  Questionable Fever -Patient afebrile here.  Per documentation was evaluated in the ED on 8/22 for fever of 102 and was being treated on doxycycline for UTI.  However UA negative here. -Continue to monitor for any fevers  Acute metabolic encephalopathy versus cognitive impairment - Patient answer certain questions inappropriately at times and would contradict himself.  Could be due to hypovolemia from anemia  History of paroxysmal atrial fibrillation - Continue amiodarone, metoprolol - Eliquis discontinued due to history of falls  History of HFpEF -Hold home 20 mg Lasix for now while receiving transfusion  Type 2 diabetes -Noted to be on 23 units long-acting insulin on a med rec but patient endorsed to me that he has only been doing 4 units a day -Start on moderate sliding scale  Collagenous colitis - Continue budesonide - Notes worsening diarrhea overnight.  Will check C. difficile and GI panel  DVT prophylaxis:.SCDs Code Status: Full Family Communication: Plan discussed with patient at bedside  disposition Plan: Home with observation Consults called:  Admission status: Observation  Level of care: Progressive  Status is: Observation  The patient remains OBS appropriate and will d/c before 2 midnights.  Dispo: The patient is from: Home              Anticipated d/c is to: Home              Patient currently is not medically stable to d/c.   Difficult to place patient No         Orene Desanctis DO [facility]   If 7PM-7AM, please contact night-coverage [email or web address]   04/03/2021, 10:25 PM

## 2021-04-03 NOTE — ED Provider Notes (Signed)
[facility] LONG EMERGENCY DEPARTMENT Provider Note  CSN: [id number] Arrival date & time: 04/03/21 4282    History Chief Complaint  Patient presents with   Weakness   Diarrhea    Tommy Peterson is a 80 y.o. male with history of CLL, recurrent anemia requiring transfusions and chronic diarrhea presents for evaluation of several days of ongoing intermittent fevers, continued diarrhea (non bloody) and generalized weakness. He was unable to get up off the floor of his bathroom earlier today and so EMS was called. He denies any CP, SOB, N/D or abdominal pain. Reports urine is slow, but no pain with urination. Last transfused 11 days ago. Seen in the ED briefly on 8/22 but left before any workup was done due to wait times.    Past Medical History:  Diagnosis Date   Acute respiratory failure (HCC)    BCC (basal cell carcinoma of skin) 08/15/2016   w/SCC Left Forehead (treatment @ [facility]   BPH (benign prostatic hyperplasia)    Carpal tunnel syndrome on both sides    Chronic diarrhea    intermittant due to chronic collagenous colitis   Chronic large granular lymphocytic leukemia (HCC) primary hemotologist-  dr Judie Petit. 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--  treatment weekly methotraxate and transfusion's (last PRBCs 07-19-2017)   Collagenous colitis    chronic--- intermittant between diarrahea and constipation   ED (erectile dysfunction)    HCAP (healthcare-associated pneumonia)    History of adenomatous polyp of colon    History of Clostridium difficile colitis 05/2016   History of pneumonia 11/08/2017   CAP -- LLL---  12-19-2017  per pt no residual symptoms   History of SCC (squamous cell carcinoma) of skin    History of sepsis    11-20-2017 severe sepsis due to UTI;  10/ 2017sepsis due to c-diff colitis   Leucocytosis    chronic    Lower urinary tract symptoms (LUTS)    Macrocytic anemia    since 02/ 2015   MGUS (monoclonal gammopathy of unknown significance)    hemotologist-  dr Michelene Heady @ [facility]   Monoclonal paraproteinemia    Neuropathy, peripheral    Nodular basal cell carcinoma (BCC) 10/01/2018   Left Neck(Nodular) Pt Does Not Want Treatment   Nodular basal cell carcinoma (BCC) 10/01/2018   Mid Forehead (treament curet and excision)   OA (osteoarthritis)    Prostatic stone    Rash of face    RIGHT SIDE   Raynaud's syndrome    Recurrent BCC (basal cell carcinoma) 03/05/2019   positive margin   SCC (squamous cell carcinoma) 08/10/2009   Central Forehead (Moh's Dr. Jeanie Sewer @ [facility])   SCC (squamous cell carcinoma) 12/08/2011   CIS-Left Cheek (treatment Aldara @ [facility])   SCC (squamous cell carcinoma) 07/09/2015   CIS-Left Forehead (treament @ [facility])   Septic shock (HCC)    x 3 w/UTI   Squamous cell carcinoma in situ (SCCIS) 12/08/2011   Left Cheek (treatment Aldara @ [facility])    Superficial basal cell carcinoma (BCC) 10/01/2018   Left Shin-Pt does not want treatment   Thin skin    fragile   Transfusion-dependent anemia since 10/ 2017   last transfusion PRBCs 07-19-2017  per hemologist note dated 10-25-2017   Type 2 diabetes mellitus treated with insulin (HCC)    followed by dr  kim (pcp)   Urinary hesitancy    Wears contact lenses    LEFT EYE ONLY    Past Surgical History:  Procedure Laterality Date   BONE MARROW BIOPSY Right 05-24-2016;  08-20-2014   CARDIOVASCULAR STRESS TEST  11-26-2012   [name]  @ [place]   normal nuclear study w/ no ischemia/  normal LV function and wall motion , ef 60% (find in care everywhere,epic)   CARDIOVERSION N/A 08/31/2020   Procedure: CARDIOVERSION;  Surgeon: [name], MD;  Location: [place];  Service: Cardiovascular;  Laterality: N/A;   CATARACT EXTRACTION W/ INTRAOCULAR LENS  IMPLANT, BILATERAL  2015    CYSTOSCOPY WITH LITHOLAPAXY N/A 12/25/2017   Procedure: CYSTOSCOPY WITH LITHOLAPAXY AND FULGERATION;  Surgeon: [name], MD;  Location: [facility];  Service: Urology;  Laterality: N/A;   INGUINAL HERNIA REPAIR Right 1980s   LOOP RECORDER INSERTION N/A 04/10/2018   Procedure: LOOP RECORDER INSERTION;  Surgeon: [name], MD;  Location: [place] [facility];  Service: Cardiovascular;  Laterality: N/A;   PENILE PROSTHESIS IMPLANT  12-02-2015   dr [name] @ [place] in [place] Left 08/2016   TEE WITHOUT CARDIOVERSION N/A 08/31/2020   Procedure: TRANSESOPHAGEAL ECHOCARDIOGRAM (TEE);  Surgeon: [name], MD;  Location: [place];  Service: Cardiovascular;  Laterality: N/A;   TONSILLECTOMY  child   TRANSURETHRAL RESECTION OF PROSTATE  2009   AND REPAIR RECURRENT RIGHT INGUINAL HERNIA    Family History  Problem Relation Age of Onset   Diabetes Mother    Heart failure Mother     Social History   Tobacco Use   Smoking status: Former    Years: 20.00    Types: Cigarettes    Quit date: 05/04/1996    Years since quitting: 24.9   Smokeless tobacco: Never  Vaping Use   Vaping Use: Never used  Substance Use Topics   Alcohol use: Yes    Comment: 1-2 glasses of wine every other day   Drug use: No     Home Medications Prior to Admission medications   Medication Sig Start Date End Date Taking? Authorizing Provider  acetaminophen (TYLENOL) 325 MG tablet Take 650 mg by mouth 2 (two) times daily as needed for fever.    [provider]  amiodarone (PACERONE) 200 MG tablet Take 1 tablet (200 mg total) by mouth daily. 09/29/20   Ledora Bottcher, PA  aspirin 325 MG EC tablet Take 325 mg by mouth 3 (three) times a week.    [provider]  B Complex Vitamins (B COMPLEX 100 PO) Take 1 tablet by mouth daily.    [provider]  BD PEN NEEDLE NANO 2ND GEN 32G X 4 MM MISC  07/22/20   [provider]   budesonide (ENTOCORT EC) 3 MG 24 hr capsule Take 9 mg by mouth in the morning.    [provider]  Continuous Blood Gluc Sensor (FREESTYLE LIBRE 2 SENSOR) MISC Inject 1 patch into the skin every 14 (fourteen) days. 03/17/21   [provider]  Cranberry 400 MG TABS Take 400 mg by mouth 2 (two) times daily.    [provider]  Cyanocobalamin (VITAMIN B-12) 5000 MCG SUBL Place 5,000 mcg under the tongue daily.    [provider]  D-Mannose 500 MG CAPS Take 500 mg by mouth in the morning and at bedtime.    [provider]  doxycycline (VIBRAMYCIN) 100  MG capsule Take 1 capsule (100 mg total) by mouth at bedtime. 01/02/21   [name], MD  folic acid (FOLVITE) 1 MG tablet TAKE 1 TABLET BY MOUTH EVERY DAY Patient taking differently: Take 1 mg by mouth daily with breakfast. 07/23/20   Brunetta Genera, MD  furosemide (LASIX) 20 MG tablet Take 20 mg for a weight 159 or below. Take 40 mg (2 tablets) for a weight over 160 lbs. Patient taking differently: Take 20 mg by mouth in the morning. 02/28/21   Croitoru, Dani Gobble, MD  metoprolol tartrate (LOPRESSOR) 50 MG tablet Take 50 mg by mouth in the morning and at bedtime.    [provider]  Misc Natural Products (PROSTATE HEALTH) CAPS Take 1 capsule by mouth daily with breakfast.    [provider]  NOVOLOG FLEXPEN 100 UNIT/ML FlexPen Inject 5-10 Units into the skin See admin instructions. Inject 5-10 units into the skin three times a day with meals, PER SLIDING SCALE    [provider]  omeprazole (PRILOSEC) 40 MG capsule Take 40 mg by mouth at bedtime.    [provider]  oxybutynin (DITROPAN) 5 MG tablet Take 5 mg by mouth in the morning.    [provider]  pioglitazone (ACTOS) 15 MG tablet Take 15 mg by mouth every morning. 10/18/17   [provider]  Berry 100-33 UNT-MCG/ML SOPN Inject 23 Units into the skin at bedtime.    [provider]      Allergies    Bee venom, Metformin and related, Sulfamethoxazole-trimethoprim, Bee pollen, and Metformin hcl   Review of Systems   Review of Systems A comprehensive review of systems was completed and negative except as noted in HPI.    Physical Exam BP (!) 167/65   Pulse 71   Temp 98.4 F (36.9 C) (Oral)   Resp (!) 21   Ht 5' 9.5" (1.765 m)   Wt 69.9 kg   SpO2 94%   BMI 22.42 kg/m   Physical Exam Vitals and nursing note reviewed.  Constitutional:      Appearance: Normal appearance.  HENT:     Head: Normocephalic and atraumatic.     Nose: Nose normal.     Mouth/Throat:     Mouth: Mucous membranes are moist.  Eyes:     Extraocular Movements: Extraocular movements intact.     Conjunctiva/sclera: Conjunctivae normal.     Comments: Pale sclera  Cardiovascular:     Rate and Rhythm: Normal rate.  Pulmonary:     Effort: Pulmonary effort is normal.     Breath sounds: Normal breath sounds.  Abdominal:     General: Abdomen is flat.     Palpations: Abdomen is soft.     Tenderness: There is no abdominal tenderness. There is no guarding.  Musculoskeletal:        General: No swelling. Normal range of motion.     Cervical back: Neck supple.  Skin:    General: Skin is warm and dry.     Coloration: Skin is pale.  Neurological:     General: No focal deficit present.     Mental Status: He is alert and oriented to person, place, and time.  Psychiatric:        Mood and Affect: Mood normal.     ED Results / Procedures / Treatments   Labs (all labs ordered are listed, but only abnormal results are displayed) Labs Reviewed  COMPREHENSIVE METABOLIC PANEL - Abnormal; Notable for the following components:  Result Value   Glucose, Bld 232 (*)    Creatinine, Ser 0.42 (*)    Calcium 8.5 (*)    Total Protein 6.0 (*)    Albumin 2.9 (*)    All other components within normal limits  CBC WITH DIFFERENTIAL/PLATELET - Abnormal; Notable for the following components:   WBC  13.7 (*)    RBC 1.88 (*)    Hemoglobin 5.7 (*)    HCT 17.5 (*)    RDW 15.9 (*)    Lymphs Abs 6.1 (*)    Monocytes Absolute 1.7 (*)    Abs Immature Granulocytes 0.19 (*)    All other components within normal limits  URINALYSIS, ROUTINE W REFLEX MICROSCOPIC - Abnormal; Notable for the following components:   Glucose, UA >=500 (*)    All other components within normal limits  RESP PANEL BY RT-PCR (FLU A&B, COVID) ARPGX2  CULTURE, BLOOD (ROUTINE X 2)  CULTURE, BLOOD (ROUTINE X 2)  LACTIC ACID, PLASMA  LACTIC ACID, PLASMA  PREPARE RBC (CROSSMATCH)  TYPE AND SCREEN    EKG EKG Interpretation  Date/Time:  Sunday April 03 2021 19:50:33 EDT Ventricular Rate:  70 PR Interval:  188 QRS Duration: 103 QT Interval:  448 QTC Calculation: 484 R Axis:   -44 Text Interpretation: Sinus rhythm Atrial premature complexes Left axis deviation Borderline prolonged QT interval No significant change since last tracing Confirmed by Calvert Cantor [phone]) on 04/03/2021 7:55:37 PM  Radiology DG Chest Port 1 View  Result Date: 04/03/2021 CLINICAL DATA:  Weakness.  Fever.  History of leukemia.  Ex-smoker. EXAM: PORTABLE CHEST 1 VIEW COMPARISON:  Chest x-ray 03/22/2021, CT chest 07/29/2018 FINDINGS: Wireless cardiac device overlies the left chest. The heart and mediastinal contours are unchanged. Aortic calcification. No focal consolidation. Coarsened interstitial markings pulmonary edema. No pleural effusion. No pneumothorax. No acute osseous abnormality. IMPRESSION: 1. No active disease. 2. Aortic Atherosclerosis (ICD10-I70.0) and Emphysema (ICD10-J43.9). Electronically Signed   By: Iven Finn M.D.   On: 04/03/2021 20:22    Procedures Procedures  Medications Ordered in the ED Medications  0.9 %  sodium chloride infusion (has no administration in time range)  lactated ringers bolus 1,000 mL (1,000 mLs Intravenous New Bag/Given 04/03/21 2206)     MDM Rules/Calculators/A&P MDM Patient with  fever, general weakness in setting of CLL and recurrent anemia. Will check labs, EKG, CXR. Begin IVF for GI fluid loss.   ED Course  I have reviewed the triage vital signs and the nursing notes.  Pertinent labs & imaging results that were available during my care of the patient were reviewed by me and considered in my medical decision making (see chart for details).  Clinical Course as of 04/03/21 2223  Nancy Fetter Apr 03, 2021  2036 CXR is clear [CS]  2132 UA is clear.  [CS]  2202 CMP and lactic acid are neg.  [CS]  2208 CBC shows marked anemia, similar to previous visits with need for admission. WBC is elevated, but no other source of infection found and not febrile here. Will discuss with Hospitalist.  [CS]  2219 Spoke with Dr. Flossie Buffy, Hospitalist, who will evaluate for admission.  [CS]  2222 Covid is neg.  [CS]    Clinical Course User Index [CS] Truddie Hidden, MD    Final Clinical Impression(s) / ED Diagnoses Final diagnoses:  Symptomatic anemia    Rx / DC Orders ED Discharge Orders     None        Truddie Hidden, MD  04/03/21 2223  

## 2021-04-04 DIAGNOSIS — D649 Anemia, unspecified: Secondary | ICD-10-CM

## 2021-04-04 DIAGNOSIS — C91Z0 Other lymphoid leukemia not having achieved remission: Secondary | ICD-10-CM | POA: Diagnosis not present

## 2021-04-04 LAB — HEMOGLOBIN A1C
Hgb A1c MFr Bld: 9.8 % — ABNORMAL HIGH (ref 4.8–5.6)
Mean Plasma Glucose: 234.56 mg/dL

## 2021-04-04 LAB — LACTIC ACID, PLASMA: Lactic Acid, Venous: 1.7 mmol/L (ref 0.5–1.9)

## 2021-04-04 LAB — GLUCOSE, CAPILLARY
Glucose-Capillary: 179 mg/dL — ABNORMAL HIGH (ref 70–99)
Glucose-Capillary: 231 mg/dL — ABNORMAL HIGH (ref 70–99)
Glucose-Capillary: 281 mg/dL — ABNORMAL HIGH (ref 70–99)
Glucose-Capillary: 295 mg/dL — ABNORMAL HIGH (ref 70–99)

## 2021-04-04 LAB — BASIC METABOLIC PANEL
Anion gap: 6 (ref 5–15)
BUN: 10 mg/dL (ref 8–23)
CO2: 26 mmol/L (ref 22–32)
Calcium: 8.5 mg/dL — ABNORMAL LOW (ref 8.9–10.3)
Chloride: 103 mmol/L (ref 98–111)
Creatinine, Ser: 0.49 mg/dL — ABNORMAL LOW (ref 0.61–1.24)
GFR, Estimated: >60 mL/min (ref >60)
Glucose, Bld: 207 mg/dL — ABNORMAL HIGH (ref 70–99)
Potassium: 3.6 mmol/L (ref 3.5–5.1)
Sodium: 135 mmol/L (ref 135–145)

## 2021-04-04 LAB — CBC
HCT: 16.9 % — ABNORMAL LOW (ref 39.0–52.0)
Hemoglobin: 5.6 g/dL — CL (ref 13.0–17.0)
MCH: 29.8 pg (ref 26.0–34.0)
MCHC: 33.1 g/dL (ref 30.0–36.0)
MCV: 89.9 fL (ref 80.0–100.0)
Platelets: 221 10*3/uL (ref 150–400)
RBC: 1.88 MIL/uL — ABNORMAL LOW (ref 4.22–5.81)
RDW: 15.9 % — ABNORMAL HIGH (ref 11.5–15.5)
WBC: 13.9 10*3/uL — ABNORMAL HIGH (ref 4.0–10.5)
nRBC: 0 % (ref 0.0–0.2)

## 2021-04-04 LAB — HEMOGLOBIN AND HEMATOCRIT, BLOOD
HCT: 25.6 % — ABNORMAL LOW (ref 39.0–52.0)
Hemoglobin: 8.7 g/dL — ABNORMAL LOW (ref 13.0–17.0)

## 2021-04-04 MED ORDER — INSULIN ASPART 100 UNIT/ML IJ SOLN
8.0000 [IU] | Freq: Once | INTRAMUSCULAR | Status: AC
  Administered 2021-04-04: 8 [IU] via SUBCUTANEOUS

## 2021-04-04 MED ORDER — B COMPLEX-C PO TABS
1.0000 | ORAL_TABLET | Freq: Every day | ORAL | Status: DC
  Administered 2021-04-04 – 2021-04-07 (×4): 1 via ORAL
  Filled 2021-04-04 (×4): qty 1

## 2021-04-04 MED ORDER — ZINC OXIDE 40 % EX OINT
TOPICAL_OINTMENT | Freq: Once | CUTANEOUS | Status: AC
  Filled 2021-04-04: qty 57

## 2021-04-04 MED ORDER — FOLIC ACID 1 MG PO TABS
2.0000 mg | ORAL_TABLET | Freq: Every day | ORAL | Status: DC
  Administered 2021-04-05 – 2021-04-07 (×3): 2 mg via ORAL
  Filled 2021-04-04 (×3): qty 2

## 2021-04-04 NOTE — Consult Note (Signed)
 Chief Complaint: History of T Cell large granular lymphocytic leukemia. Now with increasing lymphocyte #. Team is request a bone marrow biopsy.   Referring Physician(s): K. Curcio NP  Supervising Physician: Mugweru, Jon  Patient Status: [facility] - In-pt  History of Present Illness: Kaydyn R Gentle is a 80 y.o. 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 [facility] at [facility]22[facility]22 with persistent and worsening diarrhea and generalized weaknesses X 2 weeks and  new onset of fever. Found to have hypertensive with Hgb of 5.7 and Lymphocyte # 6.1.  Team is concerned that with lymphocytes increasing there may be a gradual progression the patient's T Cell large granular lymphocytic leukemia. Team is requesting a bone marrow biopsy for further evaluation.  Currently without any significant complaints.  Patient alert and laying in bed, calm and comfortable.Patient states he is "miserable" and when asked to elaborate he states "it is a sickness" Return precautions and treatment recommendations and follow-up discussed with the patient who is agreeable with the plan.   Past Medical History:  Diagnosis Date   Acute respiratory failure (HCC)    BCC (basal cell carcinoma of skin) 08/15/2016   w/SCC Left Forehead (treatment @ [facility])   BPH (benign prostatic hyperplasia)    Carpal tunnel syndrome on both sides    Chronic diarrhea    intermittant due to chronic collagenous colitis   Chronic large granular lymphocytic leukemia (HCC) primary hemotologist-  dr m. 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--  treatment weekly methotraxate and transfusion's (last PRBCs 07-19-2017)   Collagenous colitis    chronic--- intermittant between diarrahea and constipation   ED  (erectile dysfunction)    HCAP (healthcare-associated pneumonia)    History of adenomatous polyp of colon    History of Clostridium difficile colitis 05/2016   History of pneumonia 11/08/2017   CAP -- LLL---  12-19-2017  per pt no residual symptoms   History of SCC (squamous cell carcinoma) of skin    History of sepsis    11-20-2017 severe sepsis due to UTI;  10/ 2017sepsis due to c-diff colitis   Leucocytosis    chronic   Lower urinary tract symptoms (LUTS)    Macrocytic anemia    since 02/ 2015   MGUS (monoclonal gammopathy of unknown significance)    hemotologist-  dr acrasoy @ [facility]   Monoclonal paraproteinemia    Neuropathy, peripheral    Nodular basal cell carcinoma (BCC) 10/01/2018   Left Neck(Nodular) Pt Does Not Want Treatment   Nodular basal cell carcinoma (BCC) 10/01/2018   Mid Forehead (treament curet and excision)   OA (osteoarthritis)    Prostatic stone    Rash of face    RIGHT SIDE   Raynaud's syndrome    Recurrent BCC (basal cell carcinoma) 03/05/2019   positive margin   SCC (squamous cell carcinoma) 08/10/2009   Central Forehead (Moh's Dr. Williford @ [facility])   SCC (squamous cell carcinoma) 12/08/2011   CIS-Left Cheek (treatment Aldara @ [facility])   SCC (squamous cell carcinoma) 07/09/2015   CIS-Left Forehead (treament @ [facility])   Septic shock (HCC)    x 3 w/UTI   Squamous cell carcinoma in situ (SCCIS) 12/08/2011   Left Cheek (treatment Aldara @ [facility])      Superficial basal cell carcinoma (BCC) 10/01/2018   Left Shin-Pt does not want treatment   Thin skin    fragile   Transfusion-dependent anemia since 10/ 2017   last transfusion PRBCs 07-19-2017  per hemologist note dated 10-25-2017   Type 2 diabetes mellitus treated with insulin ([place])    followed by [name] ([facility])   Urinary hesitancy    Wears contact lenses    LEFT EYE ONLY    Past Surgical History:  Procedure Laterality Date   BONE MARROW BIOPSY Right 05-24-2016;  08-20-2014    CARDIOVASCULAR STRESS TEST  11-26-2012   [name]  @ [facility] ([place]   normal nuclear study w/ no ischemia/  normal LV function and wall motion , ef 60% (find in care everywhere,epic)   CARDIOVERSION N/A 08/31/2020   Procedure: CARDIOVERSION;  Surgeon: [name], MD;  Location: [facility];  Service: Cardiovascular;  Laterality: N/A;   CATARACT EXTRACTION W/ INTRAOCULAR LENS  IMPLANT, BILATERAL  2015   CYSTOSCOPY WITH LITHOLAPAXY N/A 12/25/2017   Procedure: CYSTOSCOPY WITH LITHOLAPAXY AND FULGERATION;  Surgeon: [name], MD;  Location: [facility];  Service: Urology;  Laterality: N/A;   INGUINAL HERNIA REPAIR Right 1980s   LOOP RECORDER INSERTION N/A 04/10/2018   Procedure: LOOP RECORDER INSERTION;  Surgeon: [name], MD;  Location: [place] [facility];  Service: Cardiovascular;  Laterality: N/A;   PENILE PROSTHESIS IMPLANT  12-02-2015   dr [name] @ [place] in [place] Left 08/2016   TEE WITHOUT CARDIOVERSION N/A 08/31/2020   Procedure: TRANSESOPHAGEAL ECHOCARDIOGRAM (TEE);  Surgeon: [name], MD;  Location: [facility] ENDOSCOPY;  Service: Cardiovascular;  Laterality: N/A;   TONSILLECTOMY  child   TRANSURETHRAL RESECTION OF PROSTATE  2009   AND REPAIR RECURRENT RIGHT INGUINAL HERNIA    Allergies: Bee venom, Metformin and related, Sulfamethoxazole-trimethoprim, Bee pollen, and Metformin hcl  Medications: Prior to Admission medications   Medication Sig Start Date End Date Taking? Authorizing Provider  acetaminophen (TYLENOL) 325 MG tablet Take 650 mg by mouth 2 (two) times daily as needed for fever.   Yes [provider]  amiodarone (PACERONE) 200 MG tablet Take 1 tablet (200 mg total) by mouth daily. 09/29/20  Yes [facility], Tami Lin, PA  aspirin 325 MG EC tablet Take 325 mg by mouth 3 (three) times a week.   Yes [provider]  B Complex Vitamins (B COMPLEX 100 PO) Take 1 tablet by mouth daily.    Yes [provider]  BD PEN NEEDLE NANO 2ND GEN 32G X 4 MM MISC  07/22/20  Yes [provider]  budesonide (ENTOCORT EC) 3 MG 24 hr capsule Take 9 mg by mouth in the morning.   Yes [provider]  Continuous Blood Gluc Sensor (FREESTYLE LIBRE 2 SENSOR) MISC Inject 1 patch into the skin every 14 (fourteen) days. 03/17/21  Yes [provider]  Cranberry 400 MG TABS Take 400 mg by mouth 2 (two) times daily.   Yes [provider]  Cyanocobalamin (VITAMIN B-12) 5000 MCG SUBL Place 5,000 mcg under the tongue daily.   Yes [provider]  D-Mannose 500 MG CAPS Take 500 mg by mouth in the morning and at bedtime.   Yes [provider]  doxycycline (VIBRAMYCIN) 100 MG capsule Take 1 capsule (100 mg total) by mouth at bedtime. 01/02/21  Yes [name], MD  folic acid (FOLVITE) 1 MG tablet TAKE 1 TABLET BY MOUTH  EVERY DAY Patient taking differently: Take 1 mg by mouth daily with breakfast. 07/23/20  Yes Brunetta Genera, MD  furosemide (LASIX) 20 MG tablet Take 20 mg for a weight 159 or below. Take 40 mg (2 tablets) for a weight over 160 lbs. Patient taking differently: Take 20 mg by mouth in the morning. 02/28/21  Yes Croitoru, Mihai, MD  metoprolol tartrate (LOPRESSOR) 50 MG tablet Take 50 mg by mouth in the morning and at bedtime.   Yes [provider]  NOVOLOG FLEXPEN 100 UNIT/ML FlexPen Inject 5-10 Units into the skin See admin instructions. Inject 5-10 units into the skin three times a day with meals, PER SLIDING SCALE   Yes [provider]  omeprazole (PRILOSEC) 40 MG capsule Take 40 mg by mouth at bedtime.   Yes [provider]  oxybutynin (DITROPAN) 5 MG tablet Take 5 mg by mouth in the morning.   Yes [provider]  pioglitazone (ACTOS) 15 MG tablet Take 15 mg by mouth every morning. 10/18/17  Yes [provider]  Garrett 100-33 UNT-MCG/ML SOPN Inject 23 Units into the skin at bedtime.   Yes  [provider]  Misc Natural Products (PROSTATE HEALTH) CAPS Take 1 capsule by mouth daily with breakfast.    [provider]     Family History  Problem Relation Age of Onset   Diabetes Mother    Heart failure Mother     Social History   Socioeconomic History   Marital status: Widowed    Spouse name: engaged   Number of children: 8   Years of education: college-2   Highest education level: Not on file  Occupational History   Occupation: Press photographer   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  Tobacco Use   Smoking status: Former    Years: 20.00    Types: Cigarettes    Quit date: 05/04/1996    Years since quitting: 24.9   Smokeless tobacco: Never  Vaping Use   Vaping Use: Never used  Substance and Sexual Activity   Alcohol use: Yes    Comment: 1-2 glasses of wine every other day   Drug use: No   Sexual activity: Never  Other Topics Concern   Not on file  Social History Narrative   Not on file   Social Determinants of Health   Financial Resource Strain: Not on file  Food Insecurity: Not on file  Transportation Needs: Not on file  Physical Activity: Not on file  Stress: Not on file  Social Connections: Not on file    Review of Systems: A 12 point ROS discussed and pertinent positives are indicated in the HPI above.  All other systems are negative.  Review of Systems  Constitutional:  Positive for fatigue. Negative for fever.  HENT:  Negative for congestion.   Respiratory:  Negative for cough and shortness of breath.   Cardiovascular:  Negative for chest pain.  Gastrointestinal:  Negative for abdominal pain.  Neurological:  Negative for headaches.  Psychiatric/Behavioral:  Negative for behavioral problems and confusion.    Vital Signs: BP (!) 147/65 (BP Location: Right Arm)   Pulse 68   Temp 98.9 F (37.2 C) (Oral)   Resp (!) 22   Ht 5' 9.5" (1.765 m)   Wt 154 lb (69.9 kg)   SpO2 95%   BMI 22.42 kg/m   Physical Exam Vitals and nursing note  reviewed.  Constitutional:      Appearance: He is well-developed.  HENT:  Head: Normocephalic.  Pulmonary:     Effort: Pulmonary effort is normal.  Musculoskeletal:        General: Normal range of motion.     Cervical back: Normal range of motion.  Skin:    General: Skin is dry.     Coloration: Skin is pale.  Neurological:     Mental Status: He is alert and oriented to person, place, and time.    Imaging: DG Chest Port 1 View  Result Date: 04/03/2021 CLINICAL DATA:  Weakness.  Fever.  History of leukemia.  Ex-smoker. EXAM: PORTABLE CHEST 1 VIEW COMPARISON:  Chest x-ray 03/22/2021, CT chest 07/29/2018 FINDINGS: Wireless cardiac device overlies the left chest. The heart and mediastinal contours are unchanged. Aortic calcification. No focal consolidation. Coarsened interstitial markings pulmonary edema. No pleural effusion. No pneumothorax. No acute osseous abnormality. IMPRESSION: 1. No active disease. 2. Aortic Atherosclerosis (ICD10-I70.0) and Emphysema (ICD10-J43.9). Electronically Signed   By: Morgane  Naveau M.D.   On: 04/03/2021 20:22   DG Chest Port 1 View  Result Date: 03/22/2021 CLINICAL DATA:  Shortness of breath and fatigue. EXAM: PORTABLE CHEST 1 VIEW COMPARISON:  01/21/2021 FINDINGS: Findings the cardiac silhouette, mediastinal and hilar contours are within normal limits and stable. Stable left-sided loop recorder. Stable emphysematous changes and pulmonary scarring but no acute overlying pulmonary process. IMPRESSION: Emphysematous changes and pulmonary scarring but no acute overlying pulmonary process. Electronically Signed   By: P.  Gallerani M.D.   On: 03/22/2021 21:19   VAS US LOWER EXTREMITY VENOUS (DVT)  Result Date: 03/19/2021  Lower Venous DVT Study Patient Name:  Oran R Straka  Date of Exam:   03/18/2021 Medical Rec #: [id number]          Accession #:    [id number] Date of Birth: 04/27/1941           Patient Gender: M Patient Age:   79 years Exam Location:  [facility] Procedure:      VAS US LOWER EXTREMITY VENOUS (DVT) Referring Phys: GAUTAM KALE --------------------------------------------------------------------------------  Indications: Edema.  Limitations: Poor ultrasound/tissue interface. Comparison Study: No prior study Performing Technologist: Michelle Simonetti MHA, RDMS, RVT, RDCS  Examination Guidelines: A complete evaluation includes B-mode imaging, spectral Doppler, color Doppler, and power Doppler as needed of all accessible portions of each vessel. Bilateral testing is considered an integral part of a complete examination. Limited examinations for reoccurring indications may be performed as noted. The reflux portion of the exam is performed with the patient in reverse Trendelenburg.  +---------+---------------+---------+-----------+----------+--------------+ RIGHT    CompressibilityPhasicitySpontaneityPropertiesThrombus Aging +---------+---------------+---------+-----------+----------+--------------+ CFV      Full           Yes      Yes                                 +---------+---------------+---------+-----------+----------+--------------+ SFJ      Full                                                        +---------+---------------+---------+-----------+----------+--------------+ FV Prox  Full                                                        +---------+---------------+---------+-----------+----------+--------------+   FV Mid   Full                                                        +---------+---------------+---------+-----------+----------+--------------+ FV DistalFull                                                        +---------+---------------+---------+-----------+----------+--------------+ PFV      Full                                                        +---------+---------------+---------+-----------+----------+--------------+ POP      Full           Yes      Yes                                  +---------+---------------+---------+-----------+----------+--------------+ PTV      Full                                                        +---------+---------------+---------+-----------+----------+--------------+ PERO     Full                                                        +---------+---------------+---------+-----------+----------+--------------+   +---------+---------------+---------+-----------+----------+--------------+ LEFT     CompressibilityPhasicitySpontaneityPropertiesThrombus Aging +---------+---------------+---------+-----------+----------+--------------+ CFV      Full           Yes      Yes                                 +---------+---------------+---------+-----------+----------+--------------+ SFJ      Full                                                        +---------+---------------+---------+-----------+----------+--------------+ FV Prox  Full                                                        +---------+---------------+---------+-----------+----------+--------------+ FV Mid   Full                                                        +---------+---------------+---------+-----------+----------+--------------+  FV DistalFull                                                        +---------+---------------+---------+-----------+----------+--------------+ PFV      Full                                                        +---------+---------------+---------+-----------+----------+--------------+ POP      Full           Yes      Yes                                 +---------+---------------+---------+-----------+----------+--------------+ PTV      Full                                                        +---------+---------------+---------+-----------+----------+--------------+ PERO     Full                                                         +---------+---------------+---------+-----------+----------+--------------+     Summary: BILATERAL: - No evidence of deep vein thrombosis seen in the lower extremities, bilaterally. -No evidence of popliteal cyst, bilaterally.   *See table(s) above for measurements and observations. Electronically signed by Vance Brabham MD on 03/19/2021 at 1:20:53 PM.    Final     Labs:  CBC: Recent Labs    03/22/21 1740 03/23/21 0552 04/03/21 2120 04/04/21 0050  WBC 11.2* 10.9* 13.7* 13.9*  HGB 5.4* 7.2* 5.7* 5.6*  HCT 16.6* 21.7* 17.5* 16.9*  PLT 328 267 280 221    COAGS: Recent Labs    11/06/20 0450 11/07/20 0328 12/09/20 1012  INR 1.7* 2.2* 1.6*  APTT 40*  --  36    BMP: Recent Labs    04/08/20 0846 04/09/20 0417 04/10/20 0351 04/17/20 2016 08/31/20 1157 03/22/21 1740 03/23/21 0552 04/03/21 2120 04/04/21 0050  NA 140 139 138 135   < > 136 136 136 135  K 4.0 4.4 3.9 4.1   < > 3.9 3.7 3.5 3.6  CL 106 107 105 102   < > 101 104 103 103  CO2 26 24 26 24   < > 27 27 26 26  GLUCOSE 147* 149* 167* 324*   < > 209* 219* 232* 207*  BUN 11 17 17 15   < > 18 16 14 10  CALCIUM 8.4* 8.2* 8.2* 8.1*   < > 8.7* 8.6* 8.5* 8.5*  CREATININE 0.35* 0.44* 0.48* 0.52*   < > 0.45* 0.46* 0.42* 0.49*  GFRNONAA >60 >60 >60 >60   < > >60 >60 >60 >60  GFRAA >60 >60 >60 >60  --   --   --   --   --    < > =   values in this interval not displayed.    LIVER FUNCTION TESTS: Recent Labs    02/25/21 0925 03/09/21 1007 03/22/21 1740 04/03/21 2120  BILITOT 0.8 1.0 1.2 1.0  AST 30 19 20 18  ALT 64* 51* 38 38  ALKPHOS 86 81 73 64  PROT 5.6* 6.3* 6.2* 6.0*  ALBUMIN 3.5* 3.3* 3.0* 2.9*     Assessment and Plan:  79 y.o. male inpatient. 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 Present[facility] ED at [facility] on 8[facility]22.22 with persistent and worsening diarrhea and generalized weaknesses X 2 weeks and  new onset of fever. Found to have  hypertensive with Hgb of 5.7 and Lymphocyte # 6.1.  Team is concerned that with lymphocytes increasing there may be a gradual progression the patient's T Cell large granular lymphocytic leukemia. Team is requesting a bone marrow biopsy for further evaluation.  Hgb 5.6 , Hct 16.9, Cr 0.49, calcium 8.5. Lymphocyte # 6.1 all other labs and medications are within acceptable parameters. Allergies include Bactrim.   IR consulted for possible bone marrow biopsy.  Patient tentatively scheduled for 8.31.22.  Team instructed to: Keep Patient to be NPO after midnight IR will call patient when ready.   Risks and benefits of bone marrow biopsy was discussed with the patient and/or patient's family including, but not limited to bleeding, infection, damage to adjacent structures or low yield requiring additional tests.  All of the questions were answered and there is agreement to proceed.  Consent signed and in chart.    Thank you for this interesting consult.  I greatly enjoyed meeting Breeze R Tarnow and look forward to participating in their care.  A copy of this report was sent to the requesting provider on this date.  Electronically Signed:  C , NP 04/04/2021, 1:57 PM   I spent a total of 40 Minutes    in face to face in clinical consultation, greater than 50% of which was counseling/coordinating care for bone marrow biopsy   

## 2021-04-04 NOTE — Progress Notes (Addendum)
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  At his last visit, it was recommended to consider proceeding with a repeat bone marrow biopsy.  He wanted to think about this.  He presented to the hospital with generalized weakness and diarrhea x2 weeks.  He was also having a fever up to 102 for a few days prior to admission.  He was initially seen in the emergency department on 8/22 but left AMA prior to any work-up.  On admission, his hemoglobin was 5.7.  He has received 2 units PRBCs.  He had a fever up to 101 overnight.  He reports that he feels minimally better.  He reports that his diarrhea is somewhat better.  He has not noticed any bleeding.  He has no other specific complaints today.  He tells me that he is agreeable to proceeding with a bone marrow biopsy.  REVIEW OF SYSTEMS:   Constitutional: He has generalized weakness and fevers Eyes: Denies blurriness of vision Ears, nose, mouth, throat, and face: Denies mucositis or sore throat Respiratory: Denies cough, dyspnea or wheezes Cardiovascular: Denies palpitation, chest discomfort Gastrointestinal: He has been having intermittent diarrhea Skin: Denies abnormal skin rashes Lymphatics: Bruises easily Neurological:Denies numbness, tingling or new weaknesses Behavioral/Psych: Mood is stable, no new changes  Extremities: No lower extremity edema All other systems were reviewed with the patient and are negative.  I have reviewed the past medical history, past surgical history, social history and family history with the patient and they are unchanged from previous note.   PHYSICAL EXAMINATION: ECOG PERFORMANCE STATUS: 2 - Symptomatic, <50% confined to bed  Vitals:   04/04/21 0834 04/04/21 0910  BP: (!) 176/76 (!) 168/73  Pulse: 61 63  Resp: 18 16  Temp: 99 F (37.2 C) 98.7 F (37.1 C)  SpO2: 98% 96%   Filed Weights   04/03/21  1856  Weight: 69.9 kg    Intake/Output from previous day: 08/28 0701 - 08/29 0700 In: 1245.7 [P.O.:240; I.V.:5.7; IV Piggyback:1000] Out: 500 [Urine:500]  GENERAL:alert, no distress and comfortable SKIN: Ecchymoses over his bilateral arms EYES: normal, Conjunctiva are pink and non-injected, sclera clear OROPHARYNX:no exudate, no erythema and lips, buccal mucosa, and tongue normal  LUNGS: clear to auscultation and percussion with normal breathing effort HEART: regular rate & rhythm and no murmurs and no lower extremity edema ABDOMEN:abdomen soft, non-tender and normal bowel sounds  NEURO: alert & oriented x 3 with fluent speech, no focal motor/sensory deficits  LABORATORY DATA:  I have reviewed the data as listed CMP Latest Ref Rng & Units 04/04/2021 04/03/2021 03/23/2021  Glucose 70 - 99 mg/dL 207(H) 232(H) 219(H)  BUN 8 - 23 mg/dL $Remove'10 14 16  'bRsPzwA$ Creatinine 0.61 - 1.24 mg/dL 0.49(L) 0.42(L) 0.46(L)  Sodium 135 - 145 mmol/L 135 136 136  Potassium 3.5 - 5.1 mmol/L 3.6 3.5 3.7  Chloride 98 - 111 mmol/L 103 103 104  CO2 22 - 32 mmol/L $RemoveB'26 26 27  'vPQMTvMQ$ Calcium 8.9 - 10.3 mg/dL 8.5(L) 8.5(L) 8.6(L)  Total Protein 6.5 - 8.1 g/dL - 6.0(L) -  Total Bilirubin 0.3 - 1.2 mg/dL - 1.0 -  Alkaline Phos 38 - 126 U/L - 64 -  AST 15 - 41 U/L - 18 -  ALT 0 - 44 U/L - 38 -    Lab Results  Component Value Date   WBC 13.9 (H) 04/04/2021   HGB 5.6 (LL) 04/04/2021   HCT  16.9 (L) 04/04/2021   MCV 89.9 04/04/2021   PLT 221 04/04/2021   NEUTROABS 5.7 04/03/2021    DG Chest Port 1 View  Result Date: 04/03/2021 CLINICAL DATA:  Weakness.  Fever.  History of leukemia.  Ex-smoker. EXAM: PORTABLE CHEST 1 VIEW COMPARISON:  Chest x-ray 03/22/2021, CT chest 07/29/2018 FINDINGS: Wireless cardiac device overlies the left chest. The heart and mediastinal contours are unchanged. Aortic calcification. No focal consolidation. Coarsened interstitial markings pulmonary edema. No pleural effusion. No pneumothorax. No acute  osseous abnormality. IMPRESSION: 1. No active disease. 2. Aortic Atherosclerosis (ICD10-I70.0) and Emphysema (ICD10-J43.9). Electronically Signed   By: Iven Finn M.D.   On: 04/03/2021 20:22   DG Chest Port 1 View  Result Date: 03/22/2021 CLINICAL DATA:  Shortness of breath and fatigue. EXAM: PORTABLE CHEST 1 VIEW COMPARISON:  01/21/2021 FINDINGS: Findings the cardiac silhouette, mediastinal and hilar contours are within normal limits and stable. Stable left-sided loop recorder. Stable emphysematous changes and pulmonary scarring but no acute overlying pulmonary process. IMPRESSION: Emphysematous changes and pulmonary scarring but no acute overlying pulmonary process. Electronically Signed   By: Marijo Sanes M.D.   On: 03/22/2021 21:19   VAS [place] LOWER EXTREMITY VENOUS (DVT)  Result Date: 03/19/2021  Lower Venous DVT Study Patient Name:  Tommy Peterson  Date of Exam:   03/18/2021 Medical Rec #: [id number]          Accession #:    [id number] Date of Birth: Jan 15, 1985           Patient Gender: M Patient Age:   80 years Exam Location:  [facility] Procedure:      VAS [place] LOWER EXTREMITY VENOUS (DVT) Referring Phys: Sullivan Lone --------------------------------------------------------------------------------  Indications: Edema.  Limitations: Poor ultrasound/tissue interface. Comparison Study: No prior study Performing Technologist: Maudry Mayhew MHA, RDMS, RVT, RDCS  Examination Guidelines: A complete evaluation includes B-mode imaging, spectral Doppler, color Doppler, and power Doppler as needed of all accessible portions of each vessel. Bilateral testing is considered an integral part of a complete examination. Limited examinations for reoccurring indications may be performed as noted. The reflux portion of the exam is performed with the patient in reverse Trendelenburg.  +---------+---------------+---------+-----------+----------+--------------+ RIGHT     CompressibilityPhasicitySpontaneityPropertiesThrombus Aging +---------+---------------+---------+-----------+----------+--------------+ CFV      Full           Yes      Yes                                 +---------+---------------+---------+-----------+----------+--------------+ SFJ      Full                                                        +---------+---------------+---------+-----------+----------+--------------+ FV Prox  Full                                                        +---------+---------------+---------+-----------+----------+--------------+ FV Mid   Full                                                        +---------+---------------+---------+-----------+----------+--------------+  FV DistalFull                                                        +---------+---------------+---------+-----------+----------+--------------+ PFV      Full                                                        +---------+---------------+---------+-----------+----------+--------------+ POP      Full           Yes      Yes                                 +---------+---------------+---------+-----------+----------+--------------+ PTV      Full                                                        +---------+---------------+---------+-----------+----------+--------------+ PERO     Full                                                        +---------+---------------+---------+-----------+----------+--------------+   +---------+---------------+---------+-----------+----------+--------------+ LEFT     CompressibilityPhasicitySpontaneityPropertiesThrombus Aging +---------+---------------+---------+-----------+----------+--------------+ CFV      Full           Yes      Yes                                 +---------+---------------+---------+-----------+----------+--------------+ SFJ      Full                                                         +---------+---------------+---------+-----------+----------+--------------+ FV Prox  Full                                                        +---------+---------------+---------+-----------+----------+--------------+ FV Mid   Full                                                        +---------+---------------+---------+-----------+----------+--------------+ FV DistalFull                                                        +---------+---------------+---------+-----------+----------+--------------+  PFV      Full                                                        +---------+---------------+---------+-----------+----------+--------------+ POP      Full           Yes      Yes                                 +---------+---------------+---------+-----------+----------+--------------+ PTV      Full                                                        +---------+---------------+---------+-----------+----------+--------------+ PERO     Full                                                        +---------+---------------+---------+-----------+----------+--------------+     Summary: BILATERAL: - No evidence of deep vein thrombosis seen in the lower extremities, bilaterally. -No evidence of popliteal cyst, bilaterally.   *See table(s) above for measurements and observations. Electronically signed by Coral Else MD on 03/19/2021 at 1:20:53 PM.    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  Differential not performed on this a.m.'s labs but lymphocytes on prior differentials have been slowly rising suggesting gradual  progression of his T-LGL. -We again discussed proceeding with a repeat bone marrow biopsy to assess exactly the current status of his disease and bone marrow prior to considering any new treatment.  He is agreeable to this.  Order has been placed for IR to perform bone marrow biopsy. -We can consider outpatient treatment with Cytoxan or cyclosporine versus off label use of Jakafi. -Recommend for him to take folic acid 2 mg, vitamin B12, and B complex vitamin daily. -Recommend PRBC transfusion to keep hemoglobin above 7.5. -Etiology of his fever remains unclear. He is on steroids for his microscopic colitis and so will need broad considerations for infectious etiologies.  Fevers could also be delayed reactions from blood transfusions    LOS: 0 days   04/06/2021, DNP, AGPCNP-BC, AOCNP 04/04/21  ADDENDUM  .Patient was Personally and independently interviewed, examined and relevant elements of the history of present illness were reviewed in details and an assessment and plan was created. All elements of the patient's history of present illness , assessment and plan were discussed in details with 04/06/21, DNP, AGPCNP-BC, AOCNP. The above documentation reflects our combined findings assessment and plan.   Will followup on BM Bx results. Septic w/u as per hospitalist Clenton Pare MD MS

## 2021-04-04 NOTE — Progress Notes (Signed)
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 difficile, collagenous colitis/chronic diarrhea, PAF-previously on Eliquis, chronic diastolic CHF, HTN, A3FT presented with generalized weakness and diarrhea over 2 weeks progressive worsening and also fever up to 102 for last couple of days.  He was seen in the ED 8/22 for ever but left AMA prior to any work-up.  He was hospitalized 8/16 for anemia, needing 2 unit PRBC for hemoglobin 5.4  In the ED afebrile, hypertensive, hemoglobin 5.7 g blood sugar 232.  Chest x-ray COVID-19 negative.  He was admitted for further work-up.  Subjective:  Seen and examined this morning. Not feeling weak, feels weak, getting 2nd unit prbc just now No shortness of breath or chest pain No new complaints  Assessment & Plan:  T-cell large granular lymphocytic leukemia followed by Dr. Irene Limbo: Currently on conservative approach with lab monitoring supportive transfusion but recent lymphocytes increasing suggestive of gradual progression hematology had discussed about repeat bone marrow biopsy and starting new treatment.  We will consult hematology.  Recurrent symptomatic anemia 2/2 above with transfusion dependent anemia.  Previous hemoglobin 7.2 grams last transfusion on 8/16, getting PRBC transfusion here. Recent Labs  Lab 04/03/21 2120 04/04/21 0050  HGB 5.7* 5.6*  HCT 17.5* 16.9*   Fever episode overnight 8/28:Had fever episode at home after 1 or 2.5 chest x-ray clear, COVID-19 negative.  Blood culture sent, UA-unremarkable for UTI.  Recently treated for UTI with doxycycline.  Will consult with hematology suspect it could be due to his #1.  Colitis, collagenous with chronic  diarrheas for 15 years-on p.o. budesonide, CT has been ordered for recent diarrhea.  Follow-up.  Continue supportive measures  Acute metabolic encephalopathy vs cognitive impairment poor historian in the ED. Mentation stable here. Monitor, provide supportive care, provide delirium, fall precautions.  Paroxysmal atrial fibrillation: in NSR, cont amiodarone and metoprolol.  Off anticoagulation due to high risk  Chronic Congestive heart failure with preserved ejection fraction: Monitor volume status weight daily as needed Lasix.  Home Lasix p.o. on hold [facility] Weights   04/03/21 1856  Weight: 69.9 kg  Net IO Since Admission: 1,191.72 mL [04/04/21 0920]   Type 2 diabetes mellitus with uncontrolled hyperglycemia: On insulin.  Keep on sliding scale, monitor blood glucose A1c is poorly controlled 9.8 Recent Labs  Lab 03/28/21 1847 04/04/21 0746  GLUCAP 288* 179*     Diet Order             Diet Heart Room service appropriate? Yes; Fluid consistency: Thin  Diet effective now                  Patient's Body mass index is 22.42 kg/m.   DVT prophylaxis: SCDs Start: 04/03/21 2331 Code Status:   Code Status: Full Code  Family Communication: plan of care discussed with patient at bedside. Status is: Admitted as observation Remains hospitalized for ongoing management of symptomatic anemia, fever Dispo: The patient is from: Home              Anticipated d/c is to: Home              Patient currently is not medically stable to d/c.   Difficult to place patient No Unresulted Labs (  From admission, onward)     Start     Ordered   04/03/21 2332  C Difficile Quick Screen w PCR reflex  (C Difficile quick screen w PCR reflex panel )  Once, for 24 hours,   STAT       References:    CDiff Information Tool   04/03/21 2331   04/03/21 2332  Gastrointestinal Panel by PCR , Stool  (Gastrointestinal Panel by PCR, Stool                                                                                                                                                      **Does Not include CLOSTRIDIUM DIFFICILE testing. **If CDIFF testing is needed, place order from the "C Difficile Testing" order set.**)  Once,   STAT        04/03/21 2331            Medications reviewed: Scheduled Meds:  amiodarone  200 mg Oral Daily   budesonide  9 mg Oral Daily   folic acid  1 mg Oral Q breakfast   insulin aspart  0-15 Units Subcutaneous TID WC   metoprolol tartrate  50 mg Oral BID   oxybutynin  5 mg Oral Daily   pantoprazole  40 mg Oral Daily   vitamin B-12  5,000 mcg Oral Daily   Continuous Infusions: Consultants:see note  Procedures:see note Antimicrobials: Anti-infectives (From admission, onward)    None      Culture/Microbiology    Component Value Date/Time   SDES  04/04/2021 0050    BLOOD LEFT ANTECUBITAL Performed at [facility] [place] [address] [place], [place] [address]    SPECREQUEST  04/04/2021 0050    BOTTLES DRAWN AEROBIC AND ANAEROBIC Blood Culture adequate volume Performed at [place] [facility] [address], [place], [place] [address]    CULT  04/04/2021 0050    NO GROWTH < 12 HOURS Performed at [facility] [address], [place], [place] [address]    REPTSTATUS PENDING 04/04/2021 0050    Other culture-see note  Objective: Vitals: Today's Vitals   04/04/21 0335 04/04/21 0417 04/04/21 0749 04/04/21 0834  BP: 136/71 129/61 (!) 170/60 (!) 176/76  Pulse: 64 (!) 57 (!) 57 61  Resp: _0 Temp: 98.3 F (36.8 C) 98.9 F (37.2 C) 99.3 F (37.4 C) 99 F (37.2 C)  TempSrc: Oral Oral Oral Oral  SpO2: 97% 97% 99% 98%  Weight:      Height:      PainSc:        Intake/Output Summary (Last 24 hours) at 04/04/2021 0910 Last data filed at 04/04/2021 0745 Gross per 24 hour  Intake 1691.72 ml  Output 500 ml  Net 1191.72 ml   Filed Weights   04/03/21 1856  Weight:  69.9 kg   Weight change:   Intake/Output from  previous day: 08/28 0701 - 08/29 0700 In: 1245.7 [P.O.:240; I.V.:5.7; IV Piggyback:1000] Out: 500 [Urine:500] Intake/Output this shift: Total I/O In: 446 [Blood:446] Out: -  Filed Weights   04/03/21 1856  Weight: 69.9 kg   Examination: General exam: AAO x2-3, older than stated age, weak appearing. HEENT:Oral mucosa moist, Ear/Nose WNL grossly,dentition normal. Respiratory system: bilaterallyclear no use of accessory muscle, non tender. Cardiovascular system: S1 & S2 +,No JVD. Gastrointestinal system: Abdomen soft, NT,ND, BS+. Nervous System:Alert, awake, moving extremities Extremities: no edema, distal peripheral pulses palpable.  Skin: No rashes,no icterus. MSK: Normal muscle bulk,tone, power Data Reviewed: I have personally reviewed following labs and imaging studies CBC: Recent Labs  Lab 04/03/21 2120 04/04/21 0050  WBC 13.7* 13.9*  NEUTROABS 5.7  --   HGB 5.7* 5.6*  HCT 17.5* 16.9*  MCV 93.1 89.9  PLT 280 235   Basic Metabolic Panel: Recent Labs  Lab 04/03/21 2120 04/04/21 0050  NA 136 135  K 3.5 3.6  CL 103 103  CO2 26 26  GLUCOSE 232* 207*  BUN 14 10  CREATININE 0.42* 0.49*  CALCIUM 8.5* 8.5*   GFR: Estimated Creatinine Clearance: 72.8 mL/min (A) (by C-G formula based on SCr of 0.49 mg/dL (L)). Liver Function Tests: Recent Labs  Lab 04/03/21 2120  AST 18  ALT 38  ALKPHOS 64  BILITOT 1.0  PROT 6.0*  ALBUMIN 2.9*   No results for input(s): LIPASE, AMYLASE in the last 168 hours. No results for input(s): AMMONIA in the last 168 hours. Coagulation Profile: No results for input(s): INR, PROTIME in the last 168 hours. Cardiac Enzymes: No results for input(s): CKTOTAL, CKMB, CKMBINDEX, TROPONINI in the last 168 hours. BNP (last 3 results) No results for input(s): PROBNP in the last 8760 hours. HbA1C: Recent Labs    04/04/21 0050  HGBA1C 9.8*   CBG: Recent Labs  Lab 03/28/21 1847 04/04/21 0746  GLUCAP 288* 179*   Lipid Profile: No  results for input(s): CHOL, HDL, LDLCALC, TRIG, CHOLHDL, LDLDIRECT in the last 72 hours. Thyroid Function Tests: No results for input(s): TSH, T4TOTAL, FREET4, T3FREE, THYROIDAB in the last 72 hours. Anemia Panel: No results for input(s): VITAMINB12, FOLATE, FERRITIN, TIBC, IRON, RETICCTPCT in the last 72 hours. Sepsis Labs: Recent Labs  Lab 04/03/21 2120 04/04/21 0050  LATICACIDVEN 1.6 1.7    Recent Results (from the past 240 hour(s))  Culture, blood (routine x 2)     Status: None (Preliminary result)   Collection Time: 04/03/21  7:32 PM   Specimen: BLOOD  Result Value Ref Range Status   Specimen Description   Final    BLOOD BLOOD RIGHT HAND Performed at [facility] [address] [place], [place] [address]    Special Requests   Final    BOTTLES DRAWN AEROBIC AND ANAEROBIC Blood Culture adequate volume Performed at [facility] [address], [place], [place] [address]    Culture   Final    NO GROWTH < 12 HOURS Performed at [facility] [address], [place], [place] [address]    Report Status PENDING  Incomplete  Resp Panel by RT-PCR (Flu A&B, Covid) Nasopharyngeal Swab     Status: None   Collection Time: 04/03/21  7:33 PM   Specimen: Nasopharyngeal Swab; Nasopharyngeal(NP) swabs in vial transport medium  Result Value Ref Range Status   SARS Coronavirus 2 by RT PCR NEGATIVE NEGATIVE Final  Comment: (NOTE) SARS-CoV-2 target nucleic acids are NOT DETECTED.  The SARS-CoV-2 RNA is generally detectable in upper respiratory specimens during the acute phase of infection. The lowest concentration of SARS-CoV-2 viral copies this assay can detect is 138 copies/mL. A negative result does not preclude SARS-Cov-2 infection and should not be used as the sole basis for treatment or other patient management decisions. A negative result may occur with  improper specimen collection/handling, submission of specimen other than  nasopharyngeal swab, presence of viral mutation(s) within the areas targeted by this assay, and inadequate number of viral copies(<138 copies/mL). A negative result must be combined with clinical observations, patient history, and epidemiological information. The expected result is Negative.  Fact Sheet for Patients:  EntrepreneurPulse.com.au  Fact Sheet for Healthcare Providers:  IncredibleEmployment.be  This test is no t yet approved or cleared by the [place] FDA and  has been authorized for detection and/or diagnosis of SARS-CoV-2 by FDA under an Emergency Use Authorization (EUA). This EUA will remain  in effect (meaning this test can be used) for the duration of the COVID-19 declaration under Section 564(b)(1) of the Act, 21 U.S.C.section 360bbb-3(b)(1), unless the authorization is terminated  or revoked sooner.       Influenza A by PCR NEGATIVE NEGATIVE Final   Influenza B by PCR NEGATIVE NEGATIVE Final    Comment: (NOTE) The Xpert Xpress SARS-CoV-2/FLU/RSV plus assay is intended as an aid in the diagnosis of influenza from Nasopharyngeal swab specimens and should not be used as a sole basis for treatment. Nasal washings and aspirates are unacceptable for Xpert Xpress SARS-CoV-2/FLU/RSV testing.  Fact Sheet for Patients: EntrepreneurPulse.com.au  Fact Sheet for Healthcare Providers: IncredibleEmployment.be  This test is not yet approved or cleared by the [place] FDA and has been authorized for detection and/or diagnosis of SARS-CoV-2 by FDA under an Emergency Use Authorization (EUA). This EUA will remain in effect (meaning this test can be used) for the duration of the COVID-19 declaration under Section 564(b)(1) of the Act, 21 U.S.C. section 360bbb-3(b)(1), unless the authorization is terminated or revoked.  Performed at [facility], [place] [address] [place], [place] [address]   Culture, blood (routine x 2)     Status: None (Preliminary result)   Collection Time: 04/04/21 12:50 AM   Specimen: BLOOD  Result Value Ref Range Status   Specimen Description   Final    BLOOD LEFT ANTECUBITAL Performed at [place] [facility] [address] [place], [place] [address]    Special Requests   Final    BOTTLES DRAWN AEROBIC AND ANAEROBIC Blood Culture adequate volume Performed at [facility] [address], [place], [place] [address]    Culture   Final    NO GROWTH < 12 HOURS Performed at [facility] [address], [place], [place] [address]    Report Status PENDING  Incomplete     Radiology Studies: DG Chest Port 1 View  Result Date: 04/03/2021 CLINICAL DATA:  Weakness.  Fever.  History of leukemia.  Ex-smoker. EXAM: PORTABLE CHEST 1 VIEW COMPARISON:  Chest x-ray 03/22/2021, CT chest 07/29/2018 FINDINGS: Wireless cardiac device overlies the left chest. The heart and mediastinal contours are unchanged. Aortic calcification. No focal consolidation. Coarsened interstitial markings pulmonary edema. No pleural effusion. No pneumothorax. No acute osseous abnormality. IMPRESSION: 1. No active disease. 2. Aortic Atherosclerosis (ICD10-I70.0) and Emphysema (ICD10-J43.9). Electronically Signed   By: Iven Finn M.D.   On: 04/03/2021 20:22  LOS: 0 days   Antonieta Pert, MD [facility]  04/04/2021, 9:10 AM

## 2021-04-04 NOTE — Progress Notes (Signed)
   04/04/21 [id number]  Provider Notification  Provider Name/Title Lovey Newcomer, NP  Date Provider Notified 04/04/21  Time Provider Notified [phone]  Notification Type Page  Notification Reason Critical result  Test performed and critical result Hgb 5.6  Date Critical Result Received 04/04/21  Time Critical Result Received 0320  Provider response See new orders

## 2021-04-05 DIAGNOSIS — K5900 Constipation, unspecified: Secondary | ICD-10-CM | POA: Diagnosis present

## 2021-04-05 DIAGNOSIS — E869 Volume depletion, unspecified: Secondary | ICD-10-CM | POA: Diagnosis present

## 2021-04-05 DIAGNOSIS — Z87440 Personal history of urinary (tract) infections: Secondary | ICD-10-CM | POA: Diagnosis not present

## 2021-04-05 DIAGNOSIS — K52839 Microscopic colitis, unspecified: Secondary | ICD-10-CM | POA: Diagnosis present

## 2021-04-05 DIAGNOSIS — I110 Hypertensive heart disease with heart failure: Secondary | ICD-10-CM | POA: Diagnosis present

## 2021-04-05 DIAGNOSIS — C9110 Chronic lymphocytic leukemia of B-cell type not having achieved remission: Secondary | ICD-10-CM | POA: Diagnosis present

## 2021-04-05 DIAGNOSIS — Z8249 Family history of ischemic heart disease and other diseases of the circulatory system: Secondary | ICD-10-CM | POA: Diagnosis not present

## 2021-04-05 DIAGNOSIS — C9150 Adult T-cell lymphoma/leukemia (HTLV-1-associated) not having achieved remission: Secondary | ICD-10-CM | POA: Diagnosis not present

## 2021-04-05 DIAGNOSIS — Z8616 Personal history of COVID-19: Secondary | ICD-10-CM | POA: Diagnosis not present

## 2021-04-05 DIAGNOSIS — G9341 Metabolic encephalopathy: Secondary | ICD-10-CM | POA: Diagnosis present

## 2021-04-05 DIAGNOSIS — C91Z0 Other lymphoid leukemia not having achieved remission: Secondary | ICD-10-CM | POA: Diagnosis present

## 2021-04-05 DIAGNOSIS — Z833 Family history of diabetes mellitus: Secondary | ICD-10-CM | POA: Diagnosis not present

## 2021-04-05 DIAGNOSIS — I480 Paroxysmal atrial fibrillation: Secondary | ICD-10-CM | POA: Diagnosis present

## 2021-04-05 DIAGNOSIS — D472 Monoclonal gammopathy: Secondary | ICD-10-CM | POA: Diagnosis present

## 2021-04-05 DIAGNOSIS — D630 Anemia in neoplastic disease: Secondary | ICD-10-CM | POA: Diagnosis present

## 2021-04-05 DIAGNOSIS — D649 Anemia, unspecified: Secondary | ICD-10-CM | POA: Diagnosis present

## 2021-04-05 DIAGNOSIS — I5032 Chronic diastolic (congestive) heart failure: Secondary | ICD-10-CM | POA: Diagnosis present

## 2021-04-05 DIAGNOSIS — N400 Enlarged prostate without lower urinary tract symptoms: Secondary | ICD-10-CM | POA: Diagnosis present

## 2021-04-05 DIAGNOSIS — Z9181 History of falling: Secondary | ICD-10-CM | POA: Diagnosis not present

## 2021-04-05 DIAGNOSIS — E1165 Type 2 diabetes mellitus with hyperglycemia: Secondary | ICD-10-CM | POA: Diagnosis present

## 2021-04-05 DIAGNOSIS — Z20822 Contact with and (suspected) exposure to covid-19: Secondary | ICD-10-CM | POA: Diagnosis present

## 2021-04-05 DIAGNOSIS — Z85828 Personal history of other malignant neoplasm of skin: Secondary | ICD-10-CM | POA: Diagnosis not present

## 2021-04-05 DIAGNOSIS — G629 Polyneuropathy, unspecified: Secondary | ICD-10-CM | POA: Diagnosis present

## 2021-04-05 DIAGNOSIS — K529 Noninfective gastroenteritis and colitis, unspecified: Secondary | ICD-10-CM | POA: Diagnosis present

## 2021-04-05 DIAGNOSIS — K52831 Collagenous colitis: Secondary | ICD-10-CM | POA: Diagnosis present

## 2021-04-05 LAB — BPAM RBC
Blood Product Expiration Date: 202209282359
Blood Product Expiration Date: 202209282359
ISSUE DATE / TIME: 202208290351
ISSUE DATE / TIME: 202208290849
Unit Type and Rh: 5100
Unit Type and Rh: 5100

## 2021-04-05 LAB — TYPE AND SCREEN
ABO/RH(D): O POS
Antibody Screen: NEGATIVE
Unit division: 0
Unit division: 0

## 2021-04-05 LAB — CBC
HCT: 21.3 % — ABNORMAL LOW (ref 39.0–52.0)
Hemoglobin: 7.2 g/dL — ABNORMAL LOW (ref 13.0–17.0)
MCH: 29.9 pg (ref 26.0–34.0)
MCHC: 33.8 g/dL (ref 30.0–36.0)
MCV: 88.4 fL (ref 80.0–100.0)
Platelets: 253 10*3/uL (ref 150–400)
RBC: 2.41 MIL/uL — ABNORMAL LOW (ref 4.22–5.81)
RDW: 15.2 % (ref 11.5–15.5)
WBC: 14.2 10*3/uL — ABNORMAL HIGH (ref 4.0–10.5)
nRBC: 0 % (ref 0.0–0.2)

## 2021-04-05 LAB — CBC WITH DIFFERENTIAL/PLATELET
Abs Immature Granulocytes: 0.18 10*3/uL — ABNORMAL HIGH (ref 0.00–0.07)
Basophils Absolute: 0 10*3/uL (ref 0.0–0.1)
Basophils Relative: 0 %
Eosinophils Absolute: 0 10*3/uL (ref 0.0–0.5)
Eosinophils Relative: 0 %
HCT: 23.2 % — ABNORMAL LOW (ref 39.0–52.0)
Hemoglobin: 7.9 g/dL — ABNORMAL LOW (ref 13.0–17.0)
Immature Granulocytes: 2 %
Lymphocytes Relative: 27 %
Lymphs Abs: 3.2 10*3/uL (ref 0.7–4.0)
MCH: 31.3 pg (ref 26.0–34.0)
MCHC: 34.1 g/dL (ref 30.0–36.0)
MCV: 92.1 fL (ref 80.0–100.0)
Monocytes Absolute: 0.8 10*3/uL (ref 0.1–1.0)
Monocytes Relative: 7 %
Neutro Abs: 7.4 10*3/uL (ref 1.7–7.7)
Neutrophils Relative %: 64 %
Platelets: 237 10*3/uL (ref 150–400)
RBC: 2.52 MIL/uL — ABNORMAL LOW (ref 4.22–5.81)
RDW: 15.3 % (ref 11.5–15.5)
WBC: 11.6 10*3/uL — ABNORMAL HIGH (ref 4.0–10.5)
nRBC: 0 % (ref 0.0–0.2)

## 2021-04-05 LAB — BASIC METABOLIC PANEL
Anion gap: 5 (ref 5–15)
BUN: 11 mg/dL (ref 8–23)
CO2: 26 mmol/L (ref 22–32)
Calcium: 8.7 mg/dL — ABNORMAL LOW (ref 8.9–10.3)
Chloride: 105 mmol/L (ref 98–111)
Creatinine, Ser: 0.39 mg/dL — ABNORMAL LOW (ref 0.61–1.24)
GFR, Estimated: >60 mL/min (ref >60)
Glucose, Bld: 201 mg/dL — ABNORMAL HIGH (ref 70–99)
Potassium: 3.8 mmol/L (ref 3.5–5.1)
Sodium: 136 mmol/L (ref 135–145)

## 2021-04-05 LAB — GLUCOSE, CAPILLARY
Glucose-Capillary: 190 mg/dL — ABNORMAL HIGH (ref 70–99)
Glucose-Capillary: 152 mg/dL — ABNORMAL HIGH (ref 70–99)
Glucose-Capillary: 280 mg/dL — ABNORMAL HIGH (ref 70–99)

## 2021-04-05 LAB — HEMOGLOBIN AND HEMATOCRIT, BLOOD
HCT: 23.9 % — ABNORMAL LOW (ref 39.0–52.0)
Hemoglobin: 8.2 g/dL — ABNORMAL LOW (ref 13.0–17.0)

## 2021-04-05 NOTE — Discharge Instructions (Signed)
Meals on Wheels Many local seniors are homebound and struggle to obtain healthy meals. Hundreds have no one to talk with or check on them on a regular basis. When volunteers come to the door with a hot meal Monday through Friday, the food and visit help these seniors stay healthy and independent longer.  Who Is Eligible? The Meals on Wheels Program delivers a nutritious noontime meal to homebound senior adults age 81 and over. [facility] residents age 38 and over who are homebound and unable to obtain and prepare a nutritious meal for themselves are eligible for this service. There may be a waiting list in certain parts of [facility] if the route in that area is full.  Want to Become a Meals on [employer]? Volunteers from the [place], [place] and schools work in pairs to deliver the meals, which are prepared by a Arboriculturist. The volunteers also check on the well-being of each senior they visit.  Meals-on-Wheels volunteers deliver a hot meal and a warm smile to seniors who are homebound. Deliveries generally take about an hour and a half Monday through Friday between the hours of 10:00 a.m. to 1:00 p.m. Routes are available all over [facility] and volunteers can deliver as often as their schedules allows.  Already a Meals on [employer]? Follow this link to sign up for upcoming routes you would like to deliver:  [email or web address]    formoreinfo  Contact the SeniorLine [phone]- [place] [phone] - [place] and [place] seniorline'[email or web address]

## 2021-04-05 NOTE — Progress Notes (Signed)
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 difficile, collagenous colitis/chronic diarrhea, PAF-previously on Eliquis, chronic diastolic CHF, HTN, F7PZ presented with generalized weakness and diarrhea over 2 weeks progressive worsening and also fever up to 102 for last couple of days.  He was seen in the ED 8/22 for ever but left AMA prior to any work-up.  He was hospitalized 8/16 for anemia, needing 2 unit PRBC for hemoglobin 5.4  In the ED afebrile, hypertensive, hemoglobin 5.7 g blood sugar 232.  Chest x-ray COVID-19 negative.  He was admitted for further work-up.  Subjective: Seen this morning.  Alert awake resting comfortably.  Reports he had a "decent night". No bowel movement since admission.  Had diarrhea at home.  Assessment & Plan:  T-cell large granular lymphocytic leukemia followed by Dr. Renda Rolls dependent and was last on MTX 30 mg p.o. weekly.  Oncology seen the patient and planning for bone marrow biopsy.   IgG lambda MGUS: Oncology following. Recurrent symptomatic anemia 2/2 #!Marland Kitchentransfusion dependent . Previous hemoglobin 7.2 g.  After 2 minutes hemoglobin returned to baseline.Oncology advised to keep H&H over 7.5 g, h/h this afternoon may need one mor  unit today. Recent Labs  Lab 04/03/21 2120 04/04/21 0050 04/04/21 1354 04/05/21 0442  HGB 5.7* 5.6* 8.7* 7.2*  HCT 17.5* 16.9* 25.6* 21.3*    Fever episode overnight 8/28:Had fever episode at home after 1 02.4, chest x-ray clear, COVID-19 negative.  Blood culture sent, UA-unremarkable for UTI.  Recently treated for UTI with doxycycline.  No recurrence of fever here. ?  If fever is due to transfusion  Colitis, collagenous with  chronic diarrheas for 15 years-on p.o. budesonide, C DIFF has been ordered for recent diarrhea.  Low suspicion.  No recurrence of diarrhea here.   Acute metabolic encephalopathy vs cognitive impairment poor historian in the ED. Mentation stable here.  Continue supportive measures.    Paroxysmal atrial fibrillation: in NSR, cont amiodarone and metoprolol.  Off anticoagulation due to high risk  Chronic Congestive heart failure with preserved ejection fraction: Euvolemic.  Will resume oral Lasix.  Monitor his weight intake output Filed Weights   04/03/21 1856  Weight: 69.9 kg  Net IO Since Admission: -418.28 mL [04/05/21 1022]   Type 2 diabetes mellitus with uncontrolled hyperglycemia: Blood sugar well controlled on sliding scale. HbA1c is poorly controlled 9.8 Recent Labs  Lab 04/04/21 0746 04/04/21 1612 04/04/21 2140 04/04/21 2352 04/05/21 0411  GLUCAP 179* 231* 281* 295* 190*      Diet Order             Diet NPO time specified Except for: Sips with Meds  Diet effective midnight           Diet Heart Room service appropriate? Yes; Fluid consistency: Thin  Diet effective now                  Patient's Body mass index is 22.42 kg/m.   DVT prophylaxis: SCDs Start: 04/03/21 2331 Code Status:   Code Status: Full Code  Family Communication: plan of care discussed with patient at bedside. Status is: Admitted as observation Remains hospitalized for ongoing management of symptomatic anemia, fever Dispo: The patient is from: Home  Anticipated d/c is to: Home              Patient currently is not medically stable to d/c.   Difficult to place patient No Unresulted Labs (From admission, onward)     Start     Ordered   04/06/21 0000  CBC with Differential/Platelet  ONCE - STAT,   R       Comments: For IR procedure tentatively scheduled for 8.31.22   Question:  Specimen collection method  Answer:  Lab=Lab collect   04/04/21 1519   04/05/21 2229  Basic metabolic  panel  Daily,   R     Question:  Specimen collection method  Answer:  Lab=Lab collect   04/04/21 0922   04/05/21 0500  CBC  Daily,   R     Question:  Specimen collection method  Answer:  Lab=Lab collect   04/04/21 7989   04/03/21 2332  C Difficile Quick Screen w PCR reflex  (C Difficile quick screen w PCR reflex panel )  Once, for 24 hours,   STAT       References:    CDiff Information Tool   04/03/21 2331   04/03/21 2332  Gastrointestinal Panel by PCR , Stool  (Gastrointestinal Panel by PCR, Stool                                                                                                                                                     **Does Not include CLOSTRIDIUM DIFFICILE testing. **If CDIFF testing is needed, place order from the "C Difficile Testing" order set.**)  Once,   STAT        04/03/21 2331            Medications reviewed: Scheduled Meds:  amiodarone  200 mg Oral Daily   B-complex with vitamin C  1 tablet Oral Daily   budesonide  9 mg Oral Daily   folic acid  2 mg Oral Q breakfast   insulin aspart  0-15 Units Subcutaneous TID WC   metoprolol tartrate  50 mg Oral BID   oxybutynin  5 mg Oral Daily   pantoprazole  40 mg Oral Daily   vitamin B-12  5,000 mcg Oral Daily   Continuous Infusions: Consultants:see note  Procedures:see note Antimicrobials: Anti-infectives (From admission, onward)    None      Culture/Microbiology    Component Value Date/Time   SDES  04/04/2021 0050    BLOOD LEFT ANTECUBITAL Performed at [facility], [place] [address], [place], [place] [address]    SPECREQUEST  04/04/2021 0050    BOTTLES DRAWN AEROBIC AND ANAEROBIC Blood Culture adequate volume Performed at [facility] [address] [place], [place] [address]    CULT  04/04/2021 0050    NO GROWTH 1 DAY Performed at [facility], [place] [address] [place], [place] [address]    REPTSTATUS PENDING 04/04/2021 0050    Other  culture-see note  Objective: Vitals: Today's Vitals   04/04/21 2108 04/04/21 2331 04/05/21 0455 04/05/21 0902  BP: 129/61  (!) 156/70 (!) 147/70  Pulse: 68  (!) 52 (!) 54  Resp: 20  17   Temp: 98.2 F (36.8 C)  98.1 F (36.7 C)   TempSrc: Oral  Oral   SpO2: 94%  95%   Weight:      Height:      PainSc:  0-No pain      Intake/Output Summary (Last 24 hours) at 04/05/2021 1022 Last data filed at 04/05/2021 0500 Gross per 24 hour  Intake 650 ml  Output 2500 ml  Net -1850 ml    Filed Weights   04/03/21 1856  Weight: 69.9 kg   Weight change:   Intake/Output from previous day: 08/29 0701 - 08/30 0700 In: 1336 [P.O.:600; Blood:736] Out: 2500 [Urine:2500] Intake/Output this shift: No intake/output data recorded. Filed Weights   04/03/21 1856  Weight: 69.9 kg   Examination: General exam: AAOx 3, elderly frail, pleasant.  Not in distress.  HEENT:Oral mucosa moist, Ear/Nose WNL grossly, dentition normal. Respiratory system: bilaterally diminished,  no use of accessory muscle Cardiovascular system: S1 & S2 +, No JVD,. Gastrointestinal system: Abdomen soft, NT,ND, BS+ Nervous System:Alert, awake, moving extremities and grossly nonfocal Extremities: no edema, distal peripheral pulses palpable.  Skin: No rashes,no icterus. MSK: Normal muscle bulk,tone, power.   Data Reviewed: I have personally reviewed following labs and imaging studies CBC: Recent Labs  Lab 04/03/21 2120 04/04/21 0050 04/04/21 1354 04/05/21 0442  WBC 13.7* 13.9*  --  14.2*  NEUTROABS 5.7  --   --   --   HGB 5.7* 5.6* 8.7* 7.2*  HCT 17.5* 16.9* 25.6* 21.3*  MCV 93.1 89.9  --  88.4  PLT 280 221  --  604    Basic Metabolic Panel: Recent Labs  Lab 04/03/21 2120 04/04/21 0050 04/05/21 0442  NA 136 135 136  K 3.5 3.6 3.8  CL 103 103 105  CO2 _0 GLUCOSE 232* 207* 201*  BUN _1 CREATININE 0.42* 0.49* 0.39*  CALCIUM 8.5* 8.5* 8.7*    GFR: Estimated Creatinine Clearance: 72.8  mL/min (A) (by C-G formula based on SCr of 0.39 mg/dL (L)). Liver Function Tests: Recent Labs  Lab 04/03/21 2120  AST 18  ALT 38  ALKPHOS 64  BILITOT 1.0  PROT 6.0*  ALBUMIN 2.9*    No results for input(s): LIPASE, AMYLASE in the last 168 hours. No results for input(s): AMMONIA in the last 168 hours. Coagulation Profile: No results for input(s): INR, PROTIME in the last 168 hours. Cardiac Enzymes: No results for input(s): CKTOTAL, CKMB, CKMBINDEX, TROPONINI in the last 168 hours. BNP (last 3 results) No results for input(s): PROBNP in the last 8760 hours. HbA1C: Recent Labs    04/04/21 0050  HGBA1C 9.8*    CBG: Recent Labs  Lab 04/04/21 0746 04/04/21 1612 04/04/21 2140 04/04/21 2352 04/05/21 0411  GLUCAP 179* 231* 281* 295* 190*    Lipid Profile: No results for input(s): CHOL, HDL, LDLCALC, TRIG, CHOLHDL, LDLDIRECT in the last 72 hours. Thyroid Function Tests: No results for input(s): TSH, T4TOTAL, FREET4, T3FREE, THYROIDAB in the last 72 hours. Anemia Panel: No results for input(s): VITAMINB12, FOLATE, FERRITIN, TIBC, IRON, RETICCTPCT in the last 72 hours. Sepsis Labs: Recent Labs  Lab  04/03/21 2120 04/04/21 0050  LATICACIDVEN 1.6 1.7     Recent Results (from the past 240 hour(s))  Culture, blood (routine x 2)     Status: None (Preliminary result)   Collection Time: 04/03/21  7:32 PM   Specimen: BLOOD  Result Value Ref Range Status   Specimen Description   Final    BLOOD BLOOD RIGHT HAND Performed at [facility] [address], [place], [place] [address]    Special Requests   Final    BOTTLES DRAWN AEROBIC AND ANAEROBIC Blood Culture adequate volume Performed at [facility] [address] [place], [place] [address]    Culture   Final    NO GROWTH 1 DAY Performed at [facility], [place] [address], [place], [place] [address]    Report Status PENDING  Incomplete  Resp Panel by RT-PCR (Flu A&B, Covid)  Nasopharyngeal Swab     Status: None   Collection Time: 04/03/21  7:33 PM   Specimen: Nasopharyngeal Swab; Nasopharyngeal(NP) swabs in vial transport medium  Result Value Ref Range Status   SARS Coronavirus 2 by RT PCR NEGATIVE NEGATIVE Final    Comment: (NOTE) SARS-CoV-2 target nucleic acids are NOT DETECTED.  The SARS-CoV-2 RNA is generally detectable in upper respiratory specimens during the acute phase of infection. The lowest concentration of SARS-CoV-2 viral copies this assay can detect is 138 copies/mL. A negative result does not preclude SARS-Cov-2 infection and should not be used as the sole basis for treatment or other patient management decisions. A negative result may occur with  improper specimen collection/handling, submission of specimen other than nasopharyngeal swab, presence of viral mutation(s) within the areas targeted by this assay, and inadequate number of viral copies(<138 copies/mL). A negative result must be combined with clinical observations, patient history, and epidemiological information. The expected result is Negative.  Fact Sheet for Patients:  EntrepreneurPulse.com.au  Fact Sheet for Healthcare Providers:  IncredibleEmployment.be  This test is no t yet approved or cleared by the [place] FDA and  has been authorized for detection and/or diagnosis of SARS-CoV-2 by FDA under an Emergency Use Authorization (EUA). This EUA will remain  in effect (meaning this test can be used) for the duration of the COVID-19 declaration under Section 564(b)(1) of the Act, 21 U.S.C.section 360bbb-3(b)(1), unless the authorization is terminated  or revoked sooner.       Influenza A by PCR NEGATIVE NEGATIVE Final   Influenza B by PCR NEGATIVE NEGATIVE Final    Comment: (NOTE) The Xpert Xpress SARS-CoV-2/FLU/RSV plus assay is intended as an aid in the diagnosis of influenza from Nasopharyngeal swab specimens and should not be  used as a sole basis for treatment. Nasal washings and aspirates are unacceptable for Xpert Xpress SARS-CoV-2/FLU/RSV testing.  Fact Sheet for Patients: EntrepreneurPulse.com.au  Fact Sheet for Healthcare Providers: IncredibleEmployment.be  This test is not yet approved or cleared by the [place] FDA and has been authorized for detection and/or diagnosis of SARS-CoV-2 by FDA under an Emergency Use Authorization (EUA). This EUA will remain in effect (meaning this test can be used) for the duration of the COVID-19 declaration under Section 564(b)(1) of the Act, 21 U.S.C. section 360bbb-3(b)(1), unless the authorization is terminated or revoked.  Performed at [facility], [place] [address] [place],  [address]   Culture, blood (routine x 2)     Status: None (Preliminary result)   Collection Time: 04/04/21 12:50 AM   Specimen: BLOOD  Result  Value Ref Range Status   Specimen Description   Final    BLOOD LEFT ANTECUBITAL Performed at [facility] [address], [place], [place] [address]    Special Requests   Final    BOTTLES DRAWN AEROBIC AND ANAEROBIC Blood Culture adequate volume Performed at [facility] [address], [place], [place] [address]    Culture   Final    NO GROWTH 1 DAY Performed at [facility], [place] [address], [place], [place] [address]    Report Status PENDING  Incomplete      Radiology Studies: DG Chest Port 1 View  Result Date: 04/03/2021 CLINICAL DATA:  Weakness.  Fever.  History of leukemia.  Ex-smoker. EXAM: PORTABLE CHEST 1 VIEW COMPARISON:  Chest x-ray 03/22/2021, CT chest 07/29/2018 FINDINGS: Wireless cardiac device overlies the left chest. The heart and mediastinal contours are unchanged. Aortic calcification. No focal consolidation. Coarsened interstitial markings pulmonary edema. No pleural effusion. No pneumothorax. No acute osseous  abnormality. IMPRESSION: 1. No active disease. 2. Aortic Atherosclerosis (ICD10-I70.0) and Emphysema (ICD10-J43.9). Electronically Signed   By: Iven Finn M.D.   On: 04/03/2021 20:22     LOS: 0 days   Antonieta Pert, MD [facility]  04/05/2021, 10:22 AM

## 2021-04-05 NOTE — TOC Initial Note (Addendum)
Transition of Care [facility]) - Initial/Assessment Note    Patient Details  Name: Tommy Peterson MRN: [id number] Date of Birth: 1941-01-30  [facility] CM/SW Contact:    Tommy Phi, RN Phone Number: 04/05/2021, 12:16 PM  Clinical Narrative: Spoke to patient in rm about d/c plans-A+0x3-states home is safe, dtrs stay @ home but work, & school-has rw,has transport home, can take own meds with pill box-he sets up himself;gets HHPT with [facility]-CM will confirm. PT eval await recc.Spoke to dtr Tommy Peterson she has concerns about patient eating,meds @ home-informed her to discuss any medical concern to attending-voiced understanding.CM will contact [place] CSW-Tommy Peterson [phone] about dtrs concern.   12:55p-Spoke to [facility] rep Tommy Peterson about d/c plans-[facility]-will fax order to Physician Asst B-fax#[phone]-also spoke to rep Tommy Peterson @ [facility]).Will also provide patient w/meals on wheels info.              Expected Discharge Plan: Tommy Peterson Barriers to Discharge: Continued Medical Work up   Patient Goals and CMS Choice Patient states their goals for this hospitalization and ongoing recovery are:: go home CMS Medicare.gov Compare Post Acute Care list provided to:: Patient Choice offered to / list presented to : Patient  Expected Discharge Plan and Services Expected Discharge Plan: [place]   Discharge Planning Services: CM Consult Post Acute Care Choice: [place] arrangements for the past 2 months: Single Family Home                                      Prior Living Arrangements/Services Living arrangements for the past 2 months: Single Family Home Lives with:: Adult Children Patient language and need for interpreter reviewed:: Yes Do you feel safe going back to the place where you live?: Yes      Need for Family Participation in Patient Care: No (Comment) Care giver  support system in place?: Yes (comment) Current home services: DME (rw;will confirm HHC-HHPT-[facility]) Criminal Activity/Legal Involvement Pertinent to Current Situation/Hospitalization: No - Comment as needed  Activities of Daily Living Home Assistive Devices/Equipment: Hearing aid ADL Screening (condition at time of admission) Patient's cognitive ability adequate to safely complete daily activities?: Yes Is the patient deaf or have difficulty hearing?: Yes Does the patient have difficulty seeing, even when wearing glasses/contacts?: Yes Does the patient have difficulty concentrating, remembering, or making decisions?: No Patient able to express need for assistance with ADLs?: Yes Does the patient have difficulty dressing or bathing?: No Independently performs ADLs?: Yes (appropriate for developmental age) Does the patient have difficulty walking or climbing stairs?: No Weakness of Legs: Both Weakness of Arms/Hands: Both  Permission Sought/Granted Permission sought to share information with : Case Manager Permission granted to share information with : Yes, Verbal Permission Granted  Share Information with NAME: Case Manager           Emotional Assessment Appearance:: Appears stated age Attitude/Demeanor/Rapport: Gracious Affect (typically observed): Accepting Orientation: : Oriented to Self, Oriented to Place, Oriented to  Time, Oriented to Situation Alcohol / Substance Use: Not Applicable Psych Involvement: No (comment)  Admission diagnosis:  Symptomatic anemia [D64.9] Patient Active Problem List   Diagnosis Date Noted   (HFpEF) heart failure with preserved ejection fraction ([place]) 04/03/2021   Type 2 diabetes mellitus with hyperglycemia ([place]) 04/03/2021   Symptomatic anemia 03/22/2021   Severe sepsis ([place]) 12/31/2020  Epididymitis 12/30/2020   Dehydration 12/30/2020   Hyponatremia 12/30/2020   Head injury 12/30/2020   Sepsis secondary to UTI ([place]) 12/09/2020    Frequent falls 12/09/2020   Acute on chronic heart failure with preserved ejection fraction (HFpEF) ([place]) 11/07/2020   Volume overload 11/06/2020   Chronic anticoagulation 10/11/2020   Persistent atrial fibrillation ([place])    Acute hypoxemic respiratory failure due to COVID-19 ([place]) 04/07/2020   Pressure injury of skin 09/02/2018   UTI (urinary tract infection) 09/01/2018   Sepsis ([place]) 07/28/2018   CAP (community acquired pneumonia) 07/28/2018   Paroxysmal atrial fibrillation ([place]) 04/10/2018   Aortic atherosclerosis ([place]) 04/08/2018   DM type 2 (diabetes mellitus, type 2) ([place]) 02/19/2018   Acute respiratory failure with hypoxia ([place]) 02/18/2018   MGUS (monoclonal gammopathy of unknown significance) 11/23/2017   BPH (benign prostatic hyperplasia) 11/23/2017   Left lower lobe pneumonia 11/20/2017   Nausea vomiting and diarrhea 11/20/2017   Leukemia not having achieved remission ([place]) 12/26/2016   Anemia 12/26/2016   Large granular lymphocytic leukemia ([place]) 12/26/2016   Diabetes mellitus with complication (HCC)    Macrocytic anemia    DOE (dyspnea on exertion)    Anxiety    History of GI bleed    C. difficile colitis 05/31/2016   Lactic acidosis    History of bone marrow biopsy 05/27/2016   Fever 81/27/5170   Acute metabolic encephalopathy 01/74/9449   Chest pain 05/04/2016   Benign fibroma of prostate 09/20/2015   HLD (hyperlipidemia) 09/20/2015   Essential hypertension 09/20/2015   Arthritis, degenerative 09/20/2015   Colitis, collagenous 09/09/2015   Absolute anemia 09/08/2014   Adenoma of large intestine 07/13/2014   Disturbance of skin sensation 01/09/2014   Recurrent major depressive disorder, in full remission ([place]) 07/18/2012   Allergic rhinitis 08/29/2011   PCP:  [name], DO Pharmacy:   [id number] [place], [place] [address] AT [address] [place] [address] Phone: [phone] Fax:  [phone]     Social Determinants of Health (SDOH) Interventions    Readmission Risk Interventions No flowsheet data found.

## 2021-04-06 ENCOUNTER — Inpatient Hospital Stay (HOSPITAL_COMMUNITY): Payer: No Typology Code available for payment source

## 2021-04-06 ENCOUNTER — Encounter (HOSPITAL_COMMUNITY): Payer: Self-pay | Admitting: Family Medicine

## 2021-04-06 ENCOUNTER — Inpatient Hospital Stay: Payer: Medicare HMO

## 2021-04-06 ENCOUNTER — Inpatient Hospital Stay: Payer: Medicare HMO | Admitting: Hematology

## 2021-04-06 DIAGNOSIS — C91Z0 Other lymphoid leukemia not having achieved remission: Secondary | ICD-10-CM | POA: Diagnosis not present

## 2021-04-06 LAB — BASIC METABOLIC PANEL
Anion gap: 8 (ref 5–15)
BUN: 14 mg/dL (ref 8–23)
CO2: 24 mmol/L (ref 22–32)
Calcium: 8.8 mg/dL — ABNORMAL LOW (ref 8.9–10.3)
Chloride: 105 mmol/L (ref 98–111)
Creatinine, Ser: 0.56 mg/dL — ABNORMAL LOW (ref 0.61–1.24)
GFR, Estimated: >60 mL/min (ref >60)
Glucose, Bld: 377 mg/dL — ABNORMAL HIGH (ref 70–99)
Potassium: 4.3 mmol/L (ref 3.5–5.1)
Sodium: 137 mmol/L (ref 135–145)

## 2021-04-06 LAB — GLUCOSE, CAPILLARY
Glucose-Capillary: 263 mg/dL — ABNORMAL HIGH (ref 70–99)
Glucose-Capillary: 192 mg/dL — ABNORMAL HIGH (ref 70–99)
Glucose-Capillary: 249 mg/dL — ABNORMAL HIGH (ref 70–99)
Glucose-Capillary: 266 mg/dL — ABNORMAL HIGH (ref 70–99)
Glucose-Capillary: 261 mg/dL — ABNORMAL HIGH (ref 70–99)
Glucose-Capillary: 215 mg/dL — ABNORMAL HIGH (ref 70–99)
Glucose-Capillary: 251 mg/dL — ABNORMAL HIGH (ref 70–99)

## 2021-04-06 LAB — CBC
HCT: 26.3 % — ABNORMAL LOW (ref 39.0–52.0)
Hemoglobin: 8.9 g/dL — ABNORMAL LOW (ref 13.0–17.0)
MCH: 30.6 pg (ref 26.0–34.0)
MCHC: 33.8 g/dL (ref 30.0–36.0)
MCV: 90.4 fL (ref 80.0–100.0)
Platelets: 261 10*3/uL (ref 150–400)
RBC: 2.91 MIL/uL — ABNORMAL LOW (ref 4.22–5.81)
RDW: 15.2 % (ref 11.5–15.5)
WBC: 11.2 10*3/uL — ABNORMAL HIGH (ref 4.0–10.5)
nRBC: 0 % (ref 0.0–0.2)

## 2021-04-06 IMAGING — CT CT BIOPSY AND ASPIRATION BONE MARROW
1 of 2 series · 15 of 28 positions shown, 19 images · non-contrast
Comparison: none

INDICATION: 79-year-old male with a history of chronic large granular
lymphocytic leukemia with persistent anemia and developing
lymphocytosis concerning for possible transformation into a more
aggressive process. He presents for CT-guided bone marrow biopsy.

[Series 2: i-spiral 5.0 br40 · axial · 0.78mm/px · z∈[+1187,+1264]mm · 15 of 26 slices shown, 19 images]
[im 2/26  mediastinal]
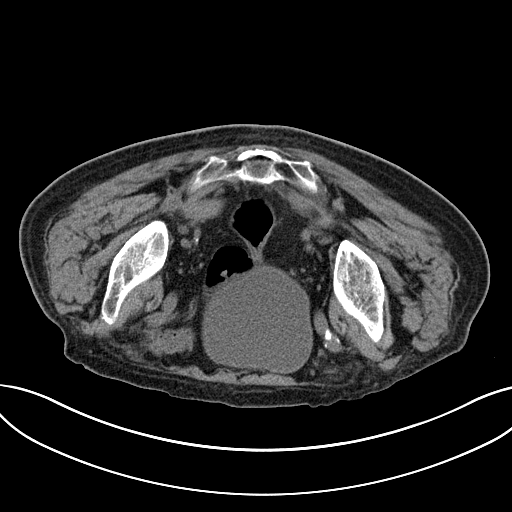
[im 2/26  lung]
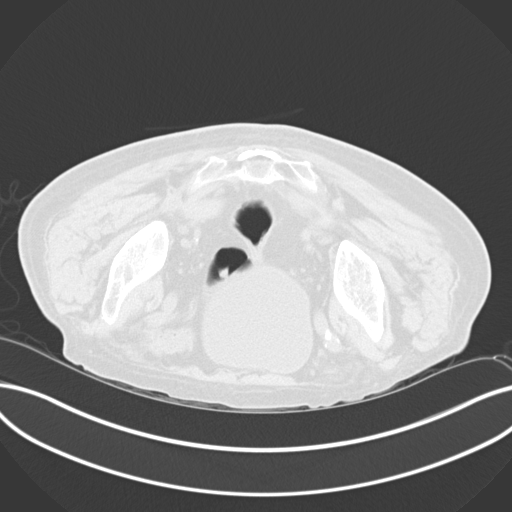
[im 4/26  lung]
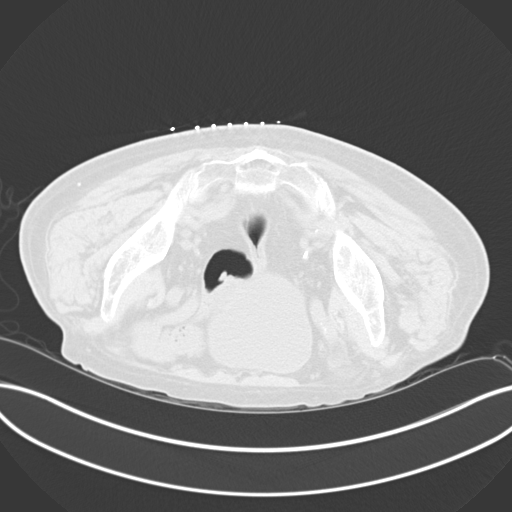
[im 5/26  lung]
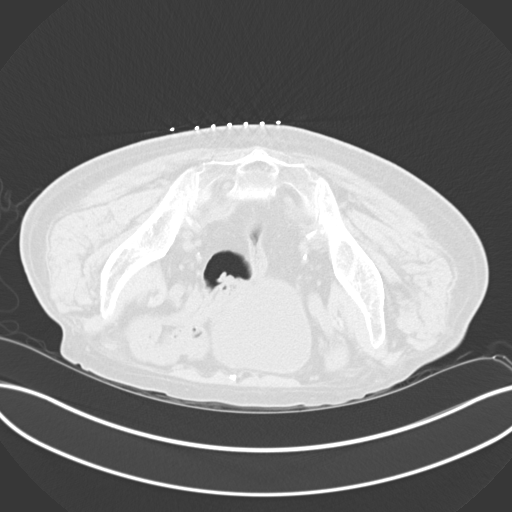
[im 7/26  lung]
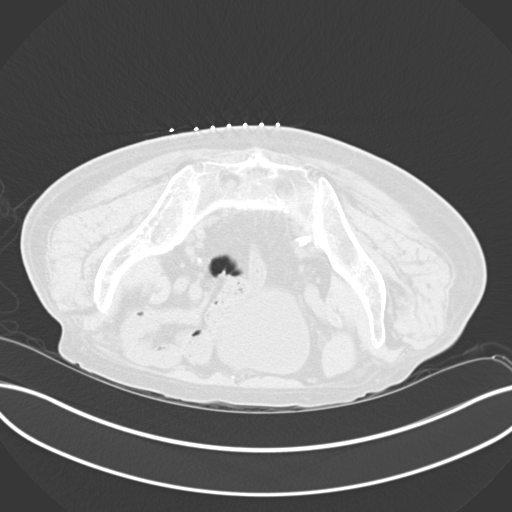
[im 8/26  mediastinal]
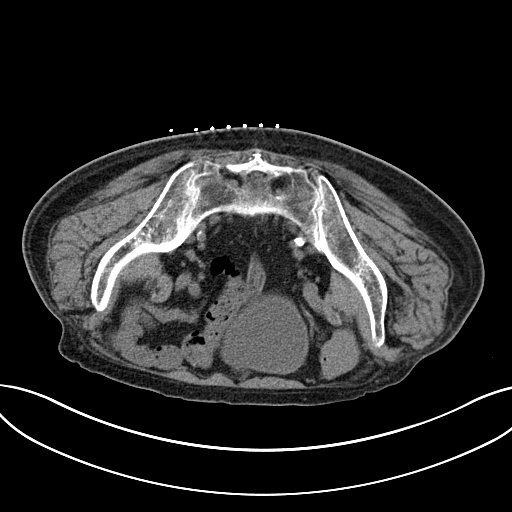
[im 8/26  lung]
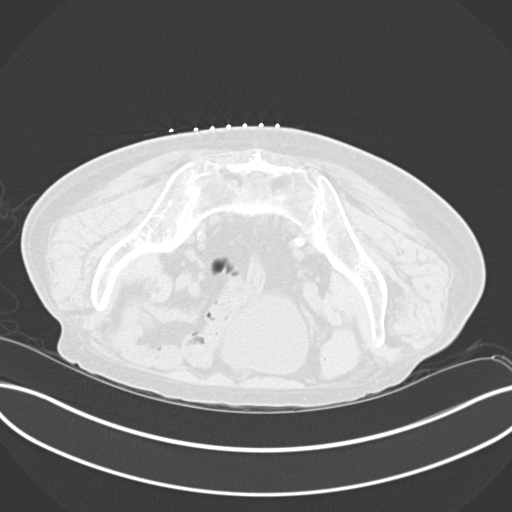
[im 10/26  lung]
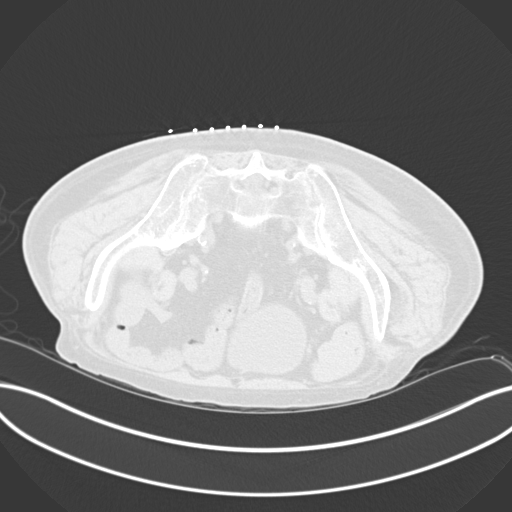
[im 11/26  lung]
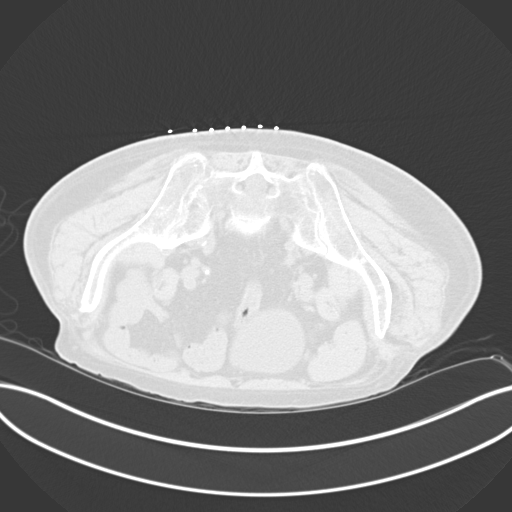
[im 13/26  lung]
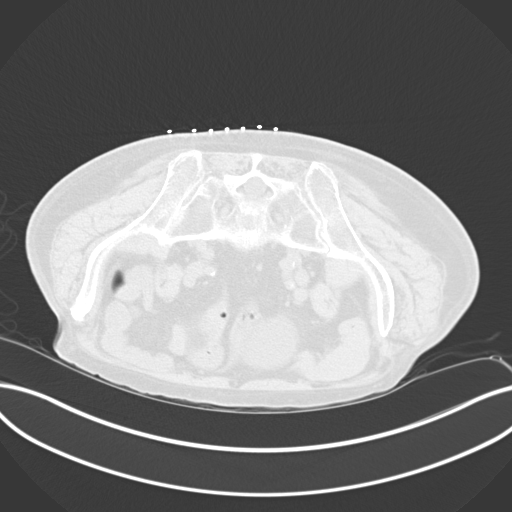
[im 15/26  mediastinal]
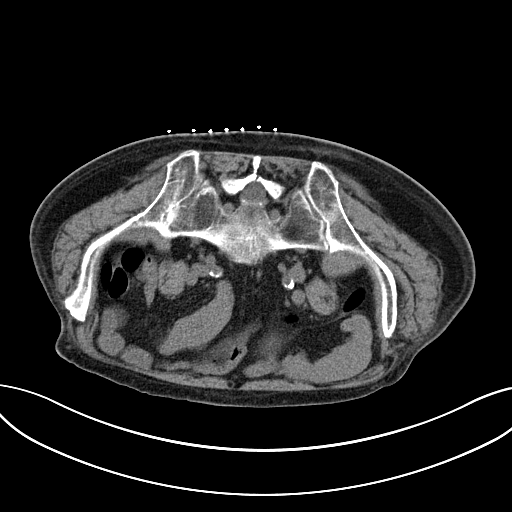
[im 15/26  lung]
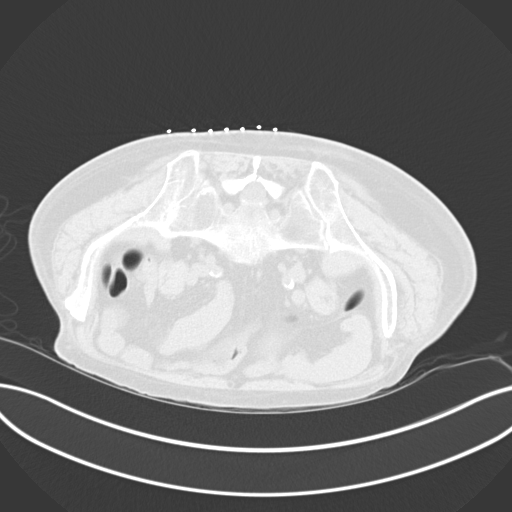
[im 16/26  lung]
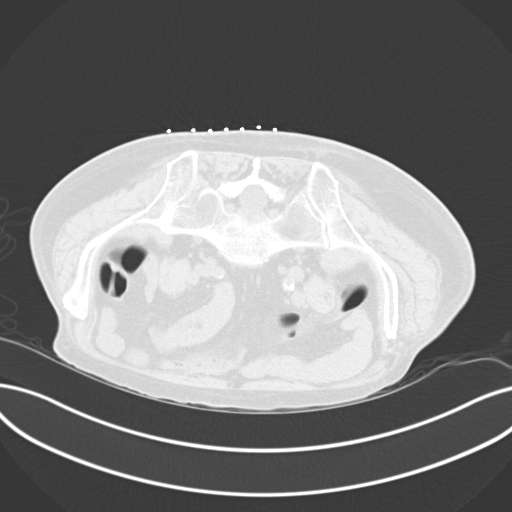
[im 18/26  lung]
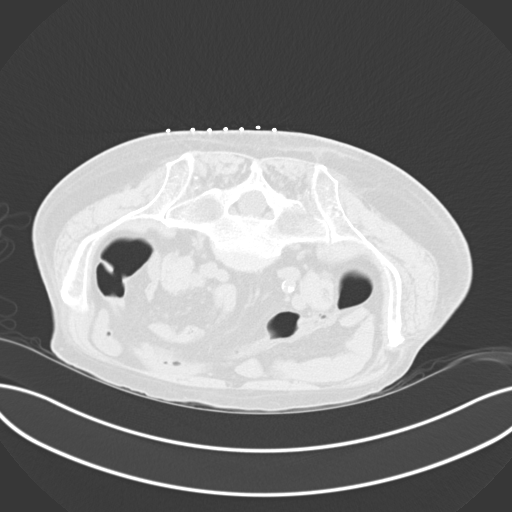
[im 19/26  lung]
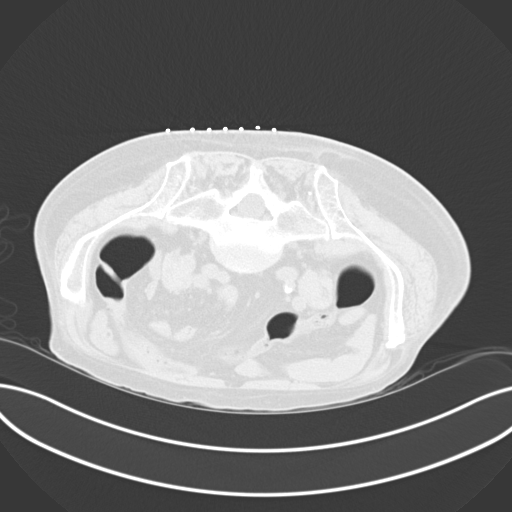
[im 21/26  mediastinal]
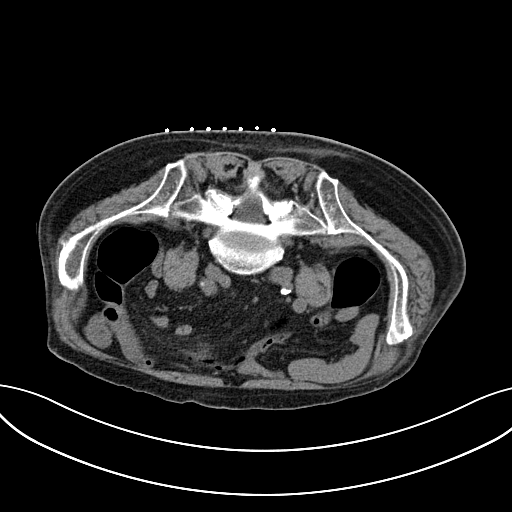
[im 21/26  lung]
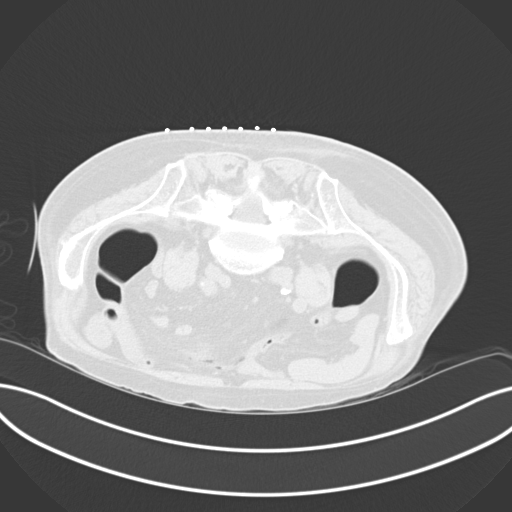
[im 22/26  lung]
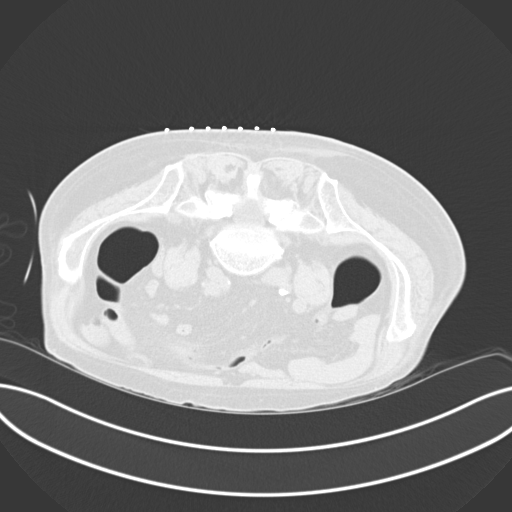
[im 24/26  lung]
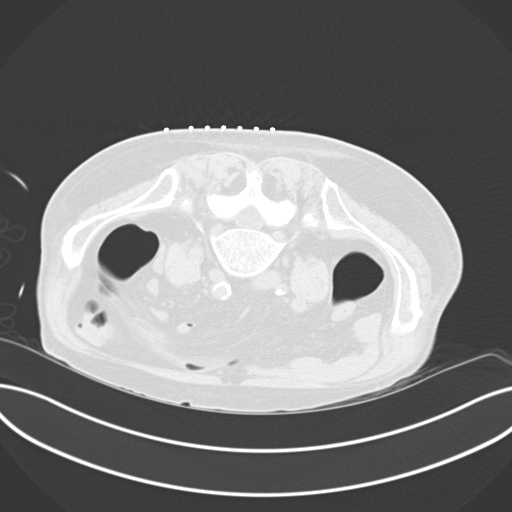

[15 of 28 positions shown; findings below may reference images not displayed]

EXAM:
CT GUIDED BONE MARROW ASPIRATION AND CORE BIOPSY

MEDICATIONS:
None.

ANESTHESIA/SEDATION:
Moderate (conscious) sedation was employed during this procedure. A
total of 1 milligrams versed and 50 micrograms fentanyl were
administered intravenously. The patient's level of consciousness and
vital signs were monitored continuously by radiology nursing
throughout the procedure under my direct supervision.

Total monitored sedation time: 10 minutes

FLUOROSCOPY TIME:  None.

COMPLICATIONS:
None immediate.

Estimated blood loss: <25 mL

PROCEDURE:
Informed written consent was obtained from the patient after a
thorough discussion of the procedural risks, benefits and
alternatives. All questions were addressed. Maximal Sterile Barrier
Technique was utilized including caps, mask, sterile gowns, sterile
gloves, sterile drape, hand hygiene and skin antiseptic. A timeout
was performed prior to the initiation of the procedure.

The patient was positioned prone and non-contrast localization CT
was performed of the pelvis to demonstrate the iliac marrow spaces.

Maximal barrier sterile technique utilized including caps, mask,
sterile gowns, sterile gloves, large sterile drape, hand hygiene,
and betadine prep.

Under sterile conditions and local anesthesia, an 11 gauge coaxial
bone biopsy needle was advanced into the right iliac marrow space.
Needle position was confirmed with CT imaging. Initially, bone
marrow aspiration was performed. Next, the 11 gauge outer cannula
was utilized to obtain a right iliac bone marrow core biopsy. Needle
was removed. Hemostasis was obtained with compression. The patient
tolerated the procedure well. Samples were prepared with the
cytotechnologist.
IMPRESSION: Successful right iliac bone marrow aspiration and core biopsy.

## 2021-04-06 MED ORDER — MIDAZOLAM HCL 2 MG/2ML IJ SOLN
INTRAMUSCULAR | Status: AC | PRN
Start: 2021-04-06 — End: 2021-04-06
  Administered 2021-04-06: 1 mg via INTRAVENOUS

## 2021-04-06 MED ORDER — FENTANYL CITRATE (PF) 100 MCG/2ML IJ SOLN
INTRAMUSCULAR | Status: AC
  Filled 2021-04-06: qty 2

## 2021-04-06 MED ORDER — MIDAZOLAM HCL 2 MG/2ML IJ SOLN
INTRAMUSCULAR | Status: AC
  Filled 2021-04-06: qty 4

## 2021-04-06 MED ORDER — LIDOCAINE HCL (PF) 1 % IJ SOLN
INTRAMUSCULAR | Status: AC | PRN
  Administered 2021-04-06: 10 mL

## 2021-04-06 MED ORDER — INSULIN GLARGINE-YFGN 100 UNIT/ML ~~LOC~~ SOLN
10.0000 [IU] | Freq: Every day | SUBCUTANEOUS | Status: DC
  Administered 2021-04-06: 10 [IU] via SUBCUTANEOUS
  Filled 2021-04-06 (×2): qty 0.1

## 2021-04-06 MED ORDER — INSULIN ASPART 100 UNIT/ML IJ SOLN
0.0000 [IU] | Freq: Three times a day (TID) | INTRAMUSCULAR | Status: DC
Start: 2021-04-06 — End: 2021-04-07
  Administered 2021-04-06 (×2): 8 [IU] via SUBCUTANEOUS
  Administered 2021-04-06: 5 [IU] via SUBCUTANEOUS
  Administered 2021-04-06: 8 [IU] via SUBCUTANEOUS
  Administered 2021-04-07 (×2): 5 [IU] via SUBCUTANEOUS

## 2021-04-06 MED ORDER — FENTANYL CITRATE (PF) 100 MCG/2ML IJ SOLN
INTRAMUSCULAR | Status: AC | PRN
  Administered 2021-04-06: 50 ug via INTRAVENOUS

## 2021-04-06 NOTE — Progress Notes (Signed)
Progress Note    Tommy Peterson   OEV:[id number]  DOB: 1941/03/09  DOA: 04/03/2021     1  PCP: Tommy Morning, DO  Initial CC: Weakness  Hospital Course: [name] is an 80 year old male with history of T-cell large granular lymphocytic leukemia, chronic anemia blood transfusion dependent, MGUS, history of C. difficile, collagenous colitis/chronic diarrhea, PAF-previously on Eliquis, chronic diastolic CHF, HTN, W2XH who presented with generalized weakness and diarrhea over 2 weeks, progressive worsening, and also fever up to 102 for last couple of days.   He was seen in the ED 8/22 for ever but left AMA prior to any work-up.  He was hospitalized 8/16 for anemia, needing 2 unit PRBC for hemoglobin 5.4   In the ED, hemoglobin 5.7 g/dL and he was admitted for transfusion and further workup.  Interval History:  Seen in his room this Peterson.  No events overnight.  Underwent bone marrow biopsy this Peterson with radiology.  Resting in bed comfortably afterwards.  ROS: Constitutional: negative for chills and fevers, Respiratory: negative for cough and sputum, Cardiovascular: negative for chest pain, and Gastrointestinal: negative for abdominal pain  Assessment & Plan: * Large granular lymphocytic leukemia ([place]) - Transfusion dependent for ongoing anemia - Underwent bone marrow biopsy on 04/06/2021, follow-up results  Symptomatic anemia - transfusion dependent - s/p 2 units PRBC on 04/04/21 - trend H/H  MGUS (monoclonal gammopathy of unknown significance) - IgG Lambda -Follows outpatient with oncology  Colitis, collagenous - on p.o. budesonide -Stool studies ordered but not collected since admission  Fever-resolved as of 04/06/2021 - Unclear etiology but appears to have been isolated - No further work-up at this time  Type 2 diabetes mellitus with hyperglycemia (HCC) - A1c 9.8% on 04/04/2021 - Continue SSI and CBG monitoring  (HFpEF) heart failure with preserved ejection  fraction (Blue) - Last echo 11/06/2020: EF 60 to 37%, grade 2 diastolic dysfunction - No signs or symptoms of exacerbation  Paroxysmal atrial fibrillation (HCC) - cont amiodarone and metoprolol.  Off anticoagulation due to high risk  Acute metabolic encephalopathy-resolved as of 04/06/2021 - Improved with supportive measures    Old records reviewed in assessment of this patient  Antimicrobials:   DVT prophylaxis: SCDs Start: 04/03/21 2331   Code Status:   Code Status: Full Code Family Communication:   Disposition Plan: Status is: Inpatient  Remains inpatient appropriate because:Ongoing diagnostic testing needed not appropriate for outpatient work up and Inpatient level of care appropriate due to severity of illness  Dispo: The patient is from: Home              Anticipated d/c is to: Home              Patient currently is not medically stable to d/c.   Difficult to place patient No      Risk of unplanned readmission score: Unplanned Admission- Pilot do not use: 39   Objective: Blood pressure 134/61, pulse (!) 54, temperature 98.4 F (36.9 C), temperature source Oral, resp. rate 15, height 5' 9.5" (1.765 m), weight 69.9 kg, SpO2 100 %.  Examination: General appearance: alert, cooperative, and no distress Head: Normocephalic, without obvious abnormality, atraumatic Eyes:  EOMI Lungs: clear to auscultation bilaterally Heart: regular rate and rhythm and S1, S2 normal Abdomen: normal findings: bowel sounds normal and soft, non-tender Extremities:  no edema Skin: mobility and turgor normal Neurologic: Grossly normal  Consultants:  Oncology IR  Procedures:  Right iliac BM biopsy, 04/06/21  Data Reviewed: I have  personally reviewed following labs and imaging studies Results for orders placed or performed during the hospital encounter of 04/03/21 (from the past 24 hour(s))  Hemoglobin and hematocrit, blood     Status: Abnormal   Collection Time: 04/05/21  2:35 PM   Result Value Ref Range   Hemoglobin 8.2 (L) 13.0 - 17.0 g/dL   HCT 91.3 (L) 67.0 - 08.0 %  Glucose, capillary     Status: Abnormal   Collection Time: 04/05/21  4:00 PM  Result Value Ref Range   Glucose-Capillary 152 (H) 70 - 99 mg/dL  Glucose, capillary     Status: Abnormal   Collection Time: 04/05/21  8:17 PM  Result Value Ref Range   Glucose-Capillary 280 (H) 70 - 99 mg/dL  CBC with Differential/Platelet     Status: Abnormal   Collection Time: 04/05/21 11:49 PM  Result Value Ref Range   WBC 11.6 (H) 4.0 - 10.5 K/uL   RBC 2.52 (L) 4.22 - 5.81 MIL/uL   Hemoglobin 7.9 (L) 13.0 - 17.0 g/dL   HCT 64.0 (L) 24.3 - 81.0 %   MCV 92.1 80.0 - 100.0 fL   MCH 31.3 26.0 - 34.0 pg   MCHC 34.1 30.0 - 36.0 g/dL   RDW 60.6 99.2 - 25.4 %   Platelets 237 150 - 400 K/uL   nRBC 0.0 0.0 - 0.2 %   Neutrophils Relative % 64 %   Neutro Abs 7.4 1.7 - 7.7 K/uL   Lymphocytes Relative 27 %   Lymphs Abs 3.2 0.7 - 4.0 K/uL   Monocytes Relative 7 %   Monocytes Absolute 0.8 0.1 - 1.0 K/uL   Eosinophils Relative 0 %   Eosinophils Absolute 0.0 0.0 - 0.5 K/uL   Basophils Relative 0 %   Basophils Absolute 0.0 0.0 - 0.1 K/uL   Immature Granulocytes 2 %   Abs Immature Granulocytes 0.18 (H) 0.00 - 0.07 K/uL  Basic metabolic panel     Status: Abnormal   Collection Time: 04/05/21 11:49 PM  Result Value Ref Range   Sodium 137 135 - 145 mmol/L   Potassium 4.3 3.5 - 5.1 mmol/L   Chloride 105 98 - 111 mmol/L   CO2 24 22 - 32 mmol/L   Glucose, Bld 377 (H) 70 - 99 mg/dL   BUN 14 8 - 23 mg/dL   Creatinine, Ser 0.14 (L) 0.61 - 1.24 mg/dL   Calcium 8.8 (L) 8.9 - 10.3 mg/dL   GFR, Estimated >95 >64 mL/min   Anion gap 8 5 - 15  Glucose, capillary     Status: Abnormal   Collection Time: 04/06/21  7:50 AM  Result Value Ref Range   Glucose-Capillary 266 (H) 70 - 99 mg/dL   Comment 1 Notify RN   CBC     Status: Abnormal   Collection Time: 04/06/21 10:39 AM  Result Value Ref Range   WBC 11.2 (H) 4.0 - 10.5 K/uL    RBC 2.91 (L) 4.22 - 5.81 MIL/uL   Hemoglobin 8.9 (L) 13.0 - 17.0 g/dL   HCT 68.1 (L) 32.8 - 52.8 %   MCV 90.4 80.0 - 100.0 fL   MCH 30.6 26.0 - 34.0 pg   MCHC 33.8 30.0 - 36.0 g/dL   RDW 44.7 06.5 - 77.5 %   Platelets 261 150 - 400 K/uL   nRBC 0.0 0.0 - 0.2 %  Glucose, capillary     Status: Abnormal   Collection Time: 04/06/21 11:51 AM  Result Value Ref Range  Glucose-Capillary 261 (H) 70 - 99 mg/dL   Comment 1 Notify RN     Recent Results (from the past 240 hour(s))  Culture, blood (routine x 2)     Status: None (Preliminary result)   Collection Time: 04/03/21  7:32 PM   Specimen: BLOOD  Result Value Ref Range Status   Specimen Description   Final    BLOOD BLOOD RIGHT HAND Performed at [facility] [address], [place], [place] [address]    Special Requests   Final    BOTTLES DRAWN AEROBIC AND ANAEROBIC Blood Culture adequate volume Performed at [facility] [address], [place], [place] [address]    Culture   Final    NO GROWTH 2 DAYS Performed at [facility] [address] [place], [place] [address]    Report Status PENDING  Incomplete  Resp Panel by RT-PCR (Flu A&B, Covid) Nasopharyngeal Swab     Status: None   Collection Time: 04/03/21  7:33 PM   Specimen: Nasopharyngeal Swab; Nasopharyngeal(NP) swabs in vial transport medium  Result Value Ref Range Status   SARS Coronavirus 2 by RT PCR NEGATIVE NEGATIVE Final    Comment: (NOTE) SARS-CoV-2 target nucleic acids are NOT DETECTED.  The SARS-CoV-2 RNA is generally detectable in upper respiratory specimens during the acute phase of infection. The lowest concentration of SARS-CoV-2 viral copies this assay can detect is 138 copies/mL. A negative result does not preclude SARS-Cov-2 infection and should not be used as the sole basis for treatment or other patient management decisions. A negative result may occur with  improper specimen collection/handling, submission  of specimen other than nasopharyngeal swab, presence of viral mutation(s) within the areas targeted by this assay, and inadequate number of viral copies(<138 copies/mL). A negative result must be combined with clinical observations, patient history, and epidemiological information. The expected result is Negative.  Fact Sheet for Patients:  EntrepreneurPulse.com.au  Fact Sheet for Healthcare Providers:  IncredibleEmployment.be  This test is no t yet approved or cleared by the [place] FDA and  has been authorized for detection and/or diagnosis of SARS-CoV-2 by FDA under an Emergency Use Authorization (EUA). This EUA will remain  in effect (meaning this test can be used) for the duration of the COVID-19 declaration under Section 564(b)(1) of the Act, 21 U.S.C.section 360bbb-3(b)(1), unless the authorization is terminated  or revoked sooner.       Influenza A by PCR NEGATIVE NEGATIVE Final   Influenza B by PCR NEGATIVE NEGATIVE Final    Comment: (NOTE) The Xpert Xpress SARS-CoV-2/FLU/RSV plus assay is intended as an aid in the diagnosis of influenza from Nasopharyngeal swab specimens and should not be used as a sole basis for treatment. Nasal washings and aspirates are unacceptable for Xpert Xpress SARS-CoV-2/FLU/RSV testing.  Fact Sheet for Patients: EntrepreneurPulse.com.au  Fact Sheet for Healthcare Providers: IncredibleEmployment.be  This test is not yet approved or cleared by the [place] FDA and has been authorized for detection and/or diagnosis of SARS-CoV-2 by FDA under an Emergency Use Authorization (EUA). This EUA will remain in effect (meaning this test can be used) for the duration of the COVID-19 declaration under Section 564(b)(1) of the Act, 21 U.S.C. section 360bbb-3(b)(1), unless the authorization is terminated or revoked.  Performed at [facility], [place]  [address] [place],  [address]   Culture, blood (routine x 2)     Status: None (Preliminary result)   Collection Time: 04/04/21 12:50 AM  Specimen: BLOOD  Result Value Ref Range Status   Specimen Description   Final    BLOOD LEFT ANTECUBITAL Performed at [facility] [address], [place], [place] [address]    Special Requests   Final    BOTTLES DRAWN AEROBIC AND ANAEROBIC Blood Culture adequate volume Performed at [facility] [address] [place], [place] [address]    Culture   Final    NO GROWTH 2 DAYS Performed at [facility] [address], [place], [place] [address]    Report Status PENDING  Incomplete     Radiology Studies: CT BIOPSY  Result Date: Apr 17, 2021 INDICATION: 80 year old male with a history of chronic large granular lymphocytic leukemia with persistent anemia and developing lymphocytosis concerning for possible transformation into a more aggressive process. He presents for CT-guided bone marrow biopsy. EXAM: CT GUIDED BONE MARROW ASPIRATION AND CORE BIOPSY Interventional Radiologist:  Criselda Peaches, MD MEDICATIONS: None. ANESTHESIA/SEDATION: Moderate (conscious) sedation was employed during this procedure. A total of 1 milligrams versed and 50 micrograms fentanyl were administered intravenously. The patient's level of consciousness and vital signs were monitored continuously by radiology nursing throughout the procedure under my direct supervision. Total monitored sedation time: 10 minutes FLUOROSCOPY TIME:  None. COMPLICATIONS: None immediate. Estimated blood loss: <25 mL PROCEDURE: Informed written consent was obtained from the patient after a thorough discussion of the procedural risks, benefits and alternatives. All questions were addressed. Maximal Sterile Barrier Technique was utilized including caps, mask, sterile gowns, sterile gloves, sterile drape, hand hygiene and skin antiseptic. A timeout was  performed prior to the initiation of the procedure. The patient was positioned prone and non-contrast localization CT was performed of the pelvis to demonstrate the iliac marrow spaces. Maximal barrier sterile technique utilized including caps, mask, sterile gowns, sterile gloves, large sterile drape, hand hygiene, and betadine prep. Under sterile conditions and local anesthesia, an 11 gauge coaxial bone biopsy needle was advanced into the right iliac marrow space. Needle position was confirmed with CT imaging. Initially, bone marrow aspiration was performed. Next, the 11 gauge outer cannula was utilized to obtain a right iliac bone marrow core biopsy. Needle was removed. Hemostasis was obtained with compression. The patient tolerated the procedure well. Samples were prepared with the cytotechnologist. IMPRESSION: Successful right iliac bone marrow aspiration and core biopsy. Electronically Signed   By: Jacqulynn Cadet M.D.   On: 04/17/21 11:01   CT BONE MARROW BIOPSY & ASPIRATION  Result Date: 04-17-2021 INDICATION: 80 year old male with a history of chronic large granular lymphocytic leukemia with persistent anemia and developing lymphocytosis concerning for possible transformation into a more aggressive process. He presents for CT-guided bone marrow biopsy. EXAM: CT GUIDED BONE MARROW ASPIRATION AND CORE BIOPSY Interventional Radiologist:  Criselda Peaches, MD MEDICATIONS: None. ANESTHESIA/SEDATION: Moderate (conscious) sedation was employed during this procedure. A total of 1 milligrams versed and 50 micrograms fentanyl were administered intravenously. The patient's level of consciousness and vital signs were monitored continuously by radiology nursing throughout the procedure under my direct supervision. Total monitored sedation time: 10 minutes FLUOROSCOPY TIME:  None. COMPLICATIONS: None immediate. Estimated blood loss: <25 mL PROCEDURE: Informed written consent was obtained from the patient after  a thorough discussion of the procedural risks, benefits and alternatives. All questions were addressed. Maximal Sterile Barrier Technique was utilized including caps, mask, sterile gowns, sterile gloves, sterile drape, hand hygiene and skin antiseptic. A timeout was performed prior to the initiation of the procedure. The  patient was positioned prone and non-contrast localization CT was performed of the pelvis to demonstrate the iliac marrow spaces. Maximal barrier sterile technique utilized including caps, mask, sterile gowns, sterile gloves, large sterile drape, hand hygiene, and betadine prep. Under sterile conditions and local anesthesia, an 11 gauge coaxial bone biopsy needle was advanced into the right iliac marrow space. Needle position was confirmed with CT imaging. Initially, bone marrow aspiration was performed. Next, the 11 gauge outer cannula was utilized to obtain a right iliac bone marrow core biopsy. Needle was removed. Hemostasis was obtained with compression. The patient tolerated the procedure well. Samples were prepared with the cytotechnologist. IMPRESSION: Successful right iliac bone marrow aspiration and core biopsy. Electronically Signed   By: Jacqulynn Cadet M.D.   On: 04/06/2021 11:01   CT BONE MARROW BIOPSY & ASPIRATION  Final Result    CT BIOPSY  Final Result    DG Chest Port 1 View  Final Result      Scheduled Meds:  amiodarone  200 mg Oral Daily   B-complex with vitamin C  1 tablet Oral Daily   budesonide  9 mg Oral Daily   fentaNYL       folic acid  2 mg Oral Q breakfast   insulin aspart  0-15 Units Subcutaneous TID AC & HS   insulin glargine-yfgn  10 Units Subcutaneous Daily   metoprolol tartrate  50 mg Oral BID   midazolam       oxybutynin  5 mg Oral Daily   pantoprazole  40 mg Oral Daily   vitamin B-12  5,000 mcg Oral Daily   PRN Meds: acetaminophen Continuous Infusions:   LOS: 1 day  Time spent: Greater than 50% of the 35 minute visit was spent in  counseling/coordination of care for the patient as laid out in the A&P.   Dwyane Dee, MD [facility] 04/06/2021, 12:57 PM

## 2021-04-06 NOTE — Procedures (Signed)
Interventional Radiology Procedure Note  Procedure: CT guided aspirate and core biopsy of right iliac bone Complications: None Recommendations: - Bedrest supine x 1 hrs - Hydrocodone PRN  Pain - Follow biopsy results  Signed,  Rubina Basinski K. Adiana Smelcer, MD   

## 2021-04-06 NOTE — Assessment & Plan Note (Addendum)
-   A1c 9.8% on 04/04/2021 - Continue SSI and CBG monitoring

## 2021-04-06 NOTE — Evaluation (Addendum)
Physical Therapy Evaluation Patient Details Name: Tommy Peterson MRN: [id number] DOB: [birth year] Today's Date: 04/06/2021   History of Present Illness  80 year old male with history of T-cell large granular lymphocytic leukemia, chronic anemia blood transfusion dependent, MGUS, history of C. difficile, collagenous colitis/chronic diarrhea, PAF-previously on Eliquis, chronic diastolic CHF, HTN, [id number] who presented with generalized weakness,diarrhea, fever. admitted with anemia. bone marrow bx on 04/06/21  Clinical Impression  Pt admitted with above diagnosis.  Pt pleased to be OOB. Amb ` 360' with RW, does not normally amb with device. Will continue to see in acute setting to maximize independence. May need HHPT--pt reports he was getting HH through the [place] PTA--??  It would benefit to resume HHPT services at d/c   Pt currently with functional limitations due to the deficits listed below (see PT Problem List). Pt will benefit from skilled PT to increase their independence and safety with mobility to allow discharge to the venue listed below.       Follow Up Recommendations HHPT ?([facility]    Equipment Recommendations  None recommended by PT    Recommendations for Other Services       Precautions / Restrictions Precautions Precautions: Fall Restrictions Weight Bearing Restrictions: No      Mobility  Bed Mobility Overal bed mobility: Needs Assistance Bed Mobility: Supine to Sit     Supine to sit: Min guard     General bed mobility comments: incr time, min/guard to elevate trunk, posterior LOB with pt able to self correct with incr time    Transfers Overall transfer level: Needs assistance Equipment used: Rolling walker (2 wheeled) Transfers: Sit to/from Stand Sit to Stand: Min guard         General transfer comment: 2 attempts, able to complete with only ,min/guard on second trial, cues for hand placement and to power up with  LEs  Ambulation/Gait Ambulation/Gait assistance: Min guard;Min assist Gait Distance (Feet): 360 Feet Assistive device: Rolling walker (2 wheeled) Gait Pattern/deviations: Step-through pattern;Drifts right/left     General Gait Details: slighlty unsteady gait without overt LOB. light assist with balance during turns, ( pt turns very quickly). intermittent assist to maneuver RW and avoid obstacles.  Stairs            Wheelchair Mobility    Modified Rankin (Stroke Patients Only)       Balance Overall balance assessment: Needs assistance   Sitting balance-Leahy Scale: Good       Standing balance-Leahy Scale: Fair Standing balance comment: not tested to mod challenges             High level balance activites: Turns;Sudden stops;Direction changes High Level Balance Comments: reaching outside BOS ~ 15 inches with unilateral UE support             Pertinent Vitals/Pain Pain Assessment: No/denies pain    Home Living Family/patient expects to be discharged to:: Private residence Living Arrangements: Children;Spouse/significant other (gf and 2 dtrs, has 2 other local dtrs) Available Help at Discharge: Family;Available 24 hours/day Type of Home: House Home Access: Stairs to enter   [employer] of Steps: 3 and 6 (his typical way) Home Layout: Able to live on main level with bedroom/bathroom;Laundry or work area in [place]: Environmental consultant - 2 wheels;Grab bars - toilet;Grab bars - tub/shower      Prior Function Level of Independence: Independent         Comments: pt reports amb without device recently  Hand Dominance        Extremity/Trunk Assessment   Upper Extremity Assessment Upper Extremity Assessment: Overall WFL for tasks assessed    Lower Extremity Assessment Lower Extremity Assessment: Overall WFL for tasks assessed       Communication   Communication: HOH  Cognition Arousal/Alertness: Awake/alert Behavior During  Therapy: WFL for tasks assessed/performed Overall Cognitive Status: Within Functional Limits for tasks assessed                                        General Comments      Exercises     Assessment/Plan    PT Assessment Patient needs continued PT services  PT Problem List Decreased mobility;Decreased activity tolerance;Decreased balance;Decreased knowledge of use of DME       PT Treatment Interventions DME instruction;Therapeutic exercise;Gait training;Functional mobility training;Therapeutic activities;Patient/family education;Balance training    PT Goals (Current goals can be found in the Care Plan section)  Acute Rehab PT Goals Patient Stated Goal: home PT Goal Formulation: With patient Time For Goal Achievement: 04/13/21 Potential to Achieve Goals: Good    Frequency Min 3X/week   Barriers to discharge        Co-evaluation               AM-PAC PT "6 Clicks" Mobility  Outcome Measure Help needed turning from your back to your side while in a flat bed without using bedrails?: A Little Help needed moving from lying on your back to sitting on the side of a flat bed without using bedrails?: A Little Help needed moving to and from a bed to a chair (including a wheelchair)?: A Little Help needed standing up from a chair using your arms (e.g., wheelchair or bedside chair)?: A Little Help needed to walk in hospital room?: A Little Help needed climbing 3-5 steps with a railing? : A Little 6 Click Score: 18    End of Session Equipment Utilized During Treatment: Gait belt Activity Tolerance: Patient tolerated treatment well Patient left: with call bell/phone within reach;in chair;with chair alarm set   PT Visit Diagnosis: Other abnormalities of gait and mobility (R26.89)    Time: [id number] PT Time Calculation (min) (ACUTE ONLY): 28 min   Charges:   PT Evaluation $PT Eval Low Complexity: 1 Low PT Treatments $Gait Training: 8-22 mins         Baxter Flattery, PT  Acute Rehab Dept ([place]) [phone] Pager [phone]  04/06/2021   [facility] 04/06/2021, 4:07 PM

## 2021-04-06 NOTE — Assessment & Plan Note (Addendum)
-  Transfusion dependent for ongoing anemia - Underwent bone marrow biopsy on 04/06/2021, follow-up results outpatient

## 2021-04-06 NOTE — Hospital Course (Addendum)
Tommy Peterson is an 80 year old male with history of T-cell large granular lymphocytic leukemia, chronic anemia blood transfusion dependent, MGUS, history of C. difficile, collagenous colitis/chronic diarrhea, PAF-previously on Eliquis, chronic diastolic CHF, HTN, [id number] who presented with generalized weakness and diarrhea over 2 weeks, progressive worsening, and also fever up to 102 for last couple of days.   He was seen in the ED 8/22 for ever but left AMA prior to any work-up.  He was hospitalized 8/16 for anemia, needing 2 unit PRBC for hemoglobin 5.4   In the ED, hemoglobin 5.7 g/dL and he was admitted for transfusion and further workup.  See below for further A&P.

## 2021-04-06 NOTE — Assessment & Plan Note (Addendum)
-   IgG Lambda -Follows outpatient with oncology

## 2021-04-06 NOTE — Assessment & Plan Note (Addendum)
-   Improved with supportive measures

## 2021-04-06 NOTE — Progress Notes (Signed)
Inpatient Diabetes Program Recommendations  AACE/ADA: New Consensus Statement on Inpatient Glycemic Control (2015)  Target Ranges:  Prepandial:   less than 140 mg/dL      Peak postprandial:   less than 180 mg/dL (1-2 hours)      Critically ill patients:  140 - 180 mg/dL   Lab Results  Component Value Date   GLUCAP 266 (H) 04/06/2021   HGBA1C 9.8 (H) 04/04/2021    Review of Glycemic Control  Diabetes history: DM2 Outpatient Diabetes medications: Soliqua 23 units QHS, Actos 15 mg QAM, Novolog 5-10 units TID Current orders for Inpatient glycemic control: Semglee 10 units QD, Novolog 0-15 units TID with meals and 0-5 HS  HgbA1C - 9.8% On Budesonide 9 mg QD  Inpatient Diabetes Program Recommendations:    Consider adding Semglee 12 units QD Add Novolog 3 units TID if eating > 50%  Continue to follow glucose trends.   Thank you. Lorenda Peck, RD, LDN, CDE Inpatient Diabetes Coordinator [phone]

## 2021-04-06 NOTE — Assessment & Plan Note (Addendum)
-   Unclear etiology but appears to have been isolated - No further work-up at this time

## 2021-04-06 NOTE — Assessment & Plan Note (Addendum)
-   on p.o. budesonide -Stool studies ordered but not collected since admission

## 2021-04-06 NOTE — Assessment & Plan Note (Addendum)
-   transfusion dependent - s/p 2 units PRBC on 04/04/21 - Hgb was 7.9 g/dL on 04/07/21 and received 1 more unit PRBC prior to discharge; he will follow up outpatient with heme-onc for repeat labwork after discharge

## 2021-04-06 NOTE — Assessment & Plan Note (Addendum)
-   Last echo 11/06/2020: EF 60 to 123456, grade 2 diastolic dysfunction - No signs or symptoms of exacerbation

## 2021-04-06 NOTE — Assessment & Plan Note (Addendum)
-   cont amiodarone and metoprolol.  Off anticoagulation due to high risk

## 2021-04-07 DIAGNOSIS — C91Z0 Other lymphoid leukemia not having achieved remission: Secondary | ICD-10-CM | POA: Diagnosis not present

## 2021-04-07 DIAGNOSIS — D649 Anemia, unspecified: Secondary | ICD-10-CM | POA: Diagnosis not present

## 2021-04-07 LAB — CBC
HCT: 23.7 % — ABNORMAL LOW (ref 39.0–52.0)
Hemoglobin: 7.9 g/dL — ABNORMAL LOW (ref 13.0–17.0)
MCH: 29.6 pg (ref 26.0–34.0)
MCHC: 33.3 g/dL (ref 30.0–36.0)
MCV: 88.8 fL (ref 80.0–100.0)
Platelets: 248 10*3/uL (ref 150–400)
RBC: 2.67 MIL/uL — ABNORMAL LOW (ref 4.22–5.81)
RDW: 15.2 % (ref 11.5–15.5)
WBC: 12.3 10*3/uL — ABNORMAL HIGH (ref 4.0–10.5)
nRBC: 0 % (ref 0.0–0.2)

## 2021-04-07 LAB — BASIC METABOLIC PANEL
Anion gap: 6 (ref 5–15)
BUN: 14 mg/dL (ref 8–23)
CO2: 26 mmol/L (ref 22–32)
Calcium: 8.9 mg/dL (ref 8.9–10.3)
Chloride: 106 mmol/L (ref 98–111)
Creatinine, Ser: 0.43 mg/dL — ABNORMAL LOW (ref 0.61–1.24)
GFR, Estimated: >60 mL/min (ref >60)
Glucose, Bld: 253 mg/dL — ABNORMAL HIGH (ref 70–99)
Potassium: 4.3 mmol/L (ref 3.5–5.1)
Sodium: 138 mmol/L (ref 135–145)

## 2021-04-07 LAB — GLUCOSE, CAPILLARY
Glucose-Capillary: 249 mg/dL — ABNORMAL HIGH (ref 70–99)
Glucose-Capillary: 244 mg/dL — ABNORMAL HIGH (ref 70–99)

## 2021-04-07 LAB — PREPARE RBC (CROSSMATCH)

## 2021-04-07 LAB — MAGNESIUM: Magnesium: 2 mg/dL (ref 1.7–2.4)

## 2021-04-07 MED ORDER — INSULIN GLARGINE-YFGN 100 UNIT/ML ~~LOC~~ SOLN
15.0000 [IU] | Freq: Every day | SUBCUTANEOUS | Status: DC
  Administered 2021-04-07: 15 [IU] via SUBCUTANEOUS
  Filled 2021-04-07: qty 0.15

## 2021-04-07 MED ORDER — SODIUM CHLORIDE 0.9% IV SOLUTION: Freq: Once | INTRAVENOUS | Status: DC

## 2021-04-07 MED ORDER — NOVOLOG FLEXPEN 100 UNIT/ML ~~LOC~~ SOPN: 1.0000 [IU] | PEN_INJECTOR | Freq: Three times a day (TID) | SUBCUTANEOUS | 11 refills | Status: AC

## 2021-04-07 NOTE — TOC Transition Note (Signed)
Transition of Care [facility]) - CM/SW Discharge Note   Patient Details  Name: Tommy Peterson MRN: [id number] Date of Birth: 27-Dec-1940  Transition of Care [facility]) CM/SW Contact:  Dessa Phi, RN Phone Number: 04/07/2021, 12:09 PM   Clinical Narrative:d/c home w/[facility] HHRN/PT/csw-rep Levada Dy aware. No further CM needs.       Final next level of care: [place] Barriers to Discharge: No Barriers Identified   Patient Goals and CMS Choice Patient states their goals for this hospitalization and ongoing recovery are:: go home CMS Medicare.gov Compare Post Acute Care list provided to:: Patient Choice offered to / list presented to : Patient  Discharge Placement                       Discharge Plan and Services   Discharge Planning Services: CM Consult Post Acute Care Choice: Home Health                    HH Arranged: RN, PT, Social Work [facility] Agency: [place] Date [place]: 04/07/21 Time [place]: 1209 Representative spoke with at [place]: [place] ([place]) Interventions     Readmission Risk Interventions No flowsheet data found.

## 2021-04-07 NOTE — Consult Note (Signed)
[facility] Inpatient Consult   04/07/2021  Tommy Peterson 08-Sep-1940 [id number]  Patient chart has been reviewed for readmissions less than 30 days and for high risk score for unplanned readmissions. Patient assessed for community [place] [facility] follow up needs. Per review, patient has [place] for care services. No [facility] CM needs.  Of note, [facility] Care Management services does not replace or interfere with any services that are arranged by inpatient case management or social work.   Netta Cedars, MSN, [facility] Liaison Nurse Mobile Phone [phone]  Toll free office [phone]

## 2021-04-07 NOTE — Progress Notes (Signed)
Pt's IV began leaking immediately after starting blood transfusion. IV removed, new IV site placed. Blood resumed at 1330, 157m/hr. Pt tolerated well.  ACoolidge Breeze RN 04/07/2021

## 2021-04-07 NOTE — Discharge Summary (Signed)
Physician Discharge Summary   RAYFIELD BEEM QIH:[id number] DOB: [birth year] DOA: 04/03/2021  PCP: [name], DO  Admit date: 04/03/2021 Discharge date: 04/07/2021   Admitted From: home Disposition:  home Discharging physician: Dwyane Dee, MD  Recommendations for Outpatient Follow-up:  Repeat Hgb Review biopsy results  Home Health:  Equipment/Devices:   Patient discharged to home in Discharge Condition: stable Risk of unplanned readmission score: Unplanned Admission- Pilot do not use: 35.31  CODE STATUS: Full Diet recommendation:  Diet Orders (From admission, onward)     Start     Ordered   04/07/21 0000  Diet - low sodium heart healthy        04/07/21 1240   04/07/21 0000  Diet Carb Modified        04/07/21 1240   04/06/21 1018  Diet Heart Room service appropriate? Yes; Fluid consistency: Thin  Diet effective now       Question Answer Comment  Room service appropriate? Yes   Fluid consistency: Thin      04/06/21 1017            Hospital Course: Mr. Nay is an 80 year old male with history of T-cell large granular lymphocytic leukemia, chronic anemia blood transfusion dependent, MGUS, history of C. difficile, collagenous colitis/chronic diarrhea, PAF-previously on Eliquis, chronic diastolic CHF, HTN, O7FI who presented with generalized weakness and diarrhea over 2 weeks, progressive worsening, and also fever up to 102 for last couple of days.   He was seen in the ED 8/22 for ever but left AMA prior to any work-up.  He was hospitalized 8/16 for anemia, needing 2 unit PRBC for hemoglobin 5.4   In the ED, hemoglobin 5.7 g/dL and he was admitted for transfusion and further workup.  See below for further A&P.   * Large granular lymphocytic leukemia ([place]) - Transfusion dependent for ongoing anemia - Underwent bone marrow biopsy on 04/06/2021, follow-up results outpatient  Symptomatic anemia - transfusion dependent - s/p 2 units PRBC on 04/04/21 - Hgb was 7.9  g/dL on 04/07/21 and received 1 more unit PRBC prior to discharge; he will follow up outpatient with heme-onc for repeat labwork after discharge   MGUS (monoclonal gammopathy of unknown significance) - IgG Lambda -Follows outpatient with oncology  Colitis, collagenous - on p.o. budesonide -Stool studies ordered but not collected since admission  Fever-resolved as of 04/06/2021 - Unclear etiology but appears to have been isolated - No further work-up at this time  Type 2 diabetes mellitus with hyperglycemia (HCC) - A1c 9.8% on 04/04/2021 - Continue SSI and CBG monitoring  (HFpEF) heart failure with preserved ejection fraction ([place]) - Last echo 11/06/2020: EF 60 to 43%, grade 2 diastolic dysfunction - No signs or symptoms of exacerbation  Paroxysmal atrial fibrillation (HCC) - cont amiodarone and metoprolol.  Off anticoagulation due to high risk  Acute metabolic encephalopathy-resolved as of 04/06/2021 - Improved with supportive measures    The patient's chronic medical conditions were treated accordingly per the patient's home medication regimen except as noted.  On day of discharge, patient was felt deemed stable for discharge. Patient/family member advised to call PCP or come back to ER if needed.   Principal Diagnosis: Large granular lymphocytic leukemia ([place])  Discharge Diagnoses: Active Hospital Problems   Diagnosis Date Noted   Large granular lymphocytic leukemia ([place]) 12/26/2016    Priority: High   Symptomatic anemia 03/22/2021    Priority: High   MGUS (monoclonal gammopathy of unknown significance) 11/23/2017    Priority: Medium  Colitis, collagenous 09/09/2015    Priority: Medium   (HFpEF) heart failure with preserved ejection fraction ([place]) 04/03/2021   Type 2 diabetes mellitus with hyperglycemia ([place]) 04/03/2021   Paroxysmal atrial fibrillation ([place]) 04/10/2018    Resolved Hospital Problems   Diagnosis Date Noted Date Resolved   Fever 05/27/2016 04/06/2021     Priority: Medium   Acute metabolic encephalopathy 15/40/0867 04/06/2021    Discharge Instructions     Diet - low sodium heart healthy   Complete by: As directed    Diet Carb Modified   Complete by: As directed    Increase activity slowly   Complete by: As directed    No wound care   Complete by: As directed       Allergies as of 04/07/2021       Reactions   Bee Venom Swelling, Anaphylaxis   Metformin And Related Nausea And Vomiting   Sulfamethoxazole-trimethoprim Nausea And Vomiting   Bee Pollen Other (See Comments)   Unknown   Metformin Hcl Nausea And Vomiting, Other (See Comments)   Made the patient feel sick        Medication List     TAKE these medications    acetaminophen 325 MG tablet Commonly known as: TYLENOL Take 650 mg by mouth 2 (two) times daily as needed for fever.   amiodarone 200 MG tablet Commonly known as: PACERONE Take 1 tablet (200 mg total) by mouth daily.   aspirin 325 MG EC tablet Take 325 mg by mouth 3 (three) times a week.   B COMPLEX 100 PO Take 1 tablet by mouth daily.   BD Pen Needle Nano 2nd Gen 32G X 4 MM Misc Generic drug: Insulin Pen Needle   budesonide 3 MG 24 hr capsule Commonly known as: ENTOCORT EC Take 9 mg by mouth in the morning.   Cranberry 400 MG Tabs Take 400 mg by mouth 2 (two) times daily.   D-Mannose 500 MG Caps Take 500 mg by mouth in the morning and at bedtime.   doxycycline 100 MG capsule Commonly known as: VIBRAMYCIN Take 1 capsule (100 mg total) by mouth at bedtime.   folic acid 1 MG tablet Commonly known as: FOLVITE TAKE 1 TABLET BY MOUTH EVERY DAY What changed: when to take this   FreeStyle Libre 2 Sensor Misc Inject 1 patch into the skin every 14 (fourteen) days.   furosemide 20 MG tablet Commonly known as: LASIX Take 20 mg for a weight 159 or below. Take 40 mg (2 tablets) for a weight over 160 lbs. What changed:  how much to take how to take this when to take this additional  instructions   metoprolol tartrate 50 MG tablet Commonly known as: LOPRESSOR Take 50 mg by mouth in the morning and at bedtime.   NovoLOG FlexPen 100 UNIT/ML FlexPen Generic drug: insulin aspart Inject 1-15 Units into the skin 3 (three) times daily with meals. Glucose 121 - 150: 2 units, Glucose 151 - 200: 3 units, Glucose 201 - 250: 5 units, Glucose 251 - 300: 8 units, Glucose 301 - 350: 11 units, Glucose 351 - 400: 15 units, Glucose > 400 call MD What changed:  how much to take when to take this additional instructions   omeprazole 40 MG capsule Commonly known as: PRILOSEC Take 40 mg by mouth at bedtime.   oxybutynin 5 MG tablet Commonly known as: DITROPAN Take 5 mg by mouth in the morning.   pioglitazone 15 MG tablet Commonly known as: ACTOS  Take 15 mg by mouth every morning.   Prostate Health Caps Take 1 capsule by mouth daily with breakfast.   Soliqua 100-33 UNT-MCG/ML Sopn Generic drug: Insulin Glargine-Lixisenatide Inject 23 Units into the skin at bedtime.   Vitamin B-12 5000 MCG Subl Place 5,000 mcg under the tongue daily.        Follow-up Information     [place], [place] Follow up.   Specialty: Home Health Services Why: [facility] nursing/physical therapy/social worker Contact information: [place] [address] [phone]                Allergies  Allergen Reactions   Bee Venom Swelling and Anaphylaxis   Metformin And Related Nausea And Vomiting   Sulfamethoxazole-Trimethoprim Nausea And Vomiting   Bee Pollen Other (See Comments)    Unknown    Metformin Hcl Nausea And Vomiting and Other (See Comments)    Made the patient feel sick     Consultations: Oncology  Discharge Exam: BP (!) 153/68 (BP Location: Right Arm)   Pulse (!) 55   Temp 98 F (36.7 C) (Oral)   Resp 16   Ht 5' 9.5" (1.765 m)   Wt 69.9 kg   SpO2 98%   BMI 22.42 kg/m  General appearance: alert, cooperative, and no  distress Head: Normocephalic, without obvious abnormality, atraumatic Eyes:  EOMI Lungs: clear to auscultation bilaterally Heart: regular rate and rhythm and S1, S2 normal Abdomen: normal findings: bowel sounds normal and soft, non-tender Extremities:  no edema Skin: mobility and turgor normal Neurologic: Grossly normal  The results of significant diagnostics from this hospitalization (including imaging, microbiology, ancillary and laboratory) are listed below for reference.   Microbiology: Recent Results (from the past 240 hour(s))  Culture, blood (routine x 2)     Status: None (Preliminary result)   Collection Time: 04/03/21  7:32 PM   Specimen: BLOOD  Result Value Ref Range Status   Specimen Description   Final    BLOOD BLOOD RIGHT HAND Performed at [facility], [place] [address], [place], [place] [address]    Special Requests   Final    BOTTLES DRAWN AEROBIC AND ANAEROBIC Blood Culture adequate volume Performed at [facility] [address], [place], [place] [address]    Culture   Final    NO GROWTH 3 DAYS Performed at [facility], [place] [address] [place], [place] [address]    Report Status PENDING  Incomplete  Resp Panel by RT-PCR (Flu A&B, Covid) Nasopharyngeal Swab     Status: None   Collection Time: 04/03/21  7:33 PM   Specimen: Nasopharyngeal Swab; Nasopharyngeal(NP) swabs in vial transport medium  Result Value Ref Range Status   SARS Coronavirus 2 by RT PCR NEGATIVE NEGATIVE Final    Comment: (NOTE) SARS-CoV-2 target nucleic acids are NOT DETECTED.  The SARS-CoV-2 RNA is generally detectable in upper respiratory specimens during the acute phase of infection. The lowest concentration of SARS-CoV-2 viral copies this assay can detect is 138 copies/mL. A negative result does not preclude SARS-Cov-2 infection and should not be used as the sole basis for treatment or other patient management decisions. A negative result may  occur with  improper specimen collection/handling, submission of specimen other than nasopharyngeal swab, presence of viral mutation(s) within the areas targeted by this assay, and inadequate number of viral copies(<138 copies/mL). A negative result must be combined with clinical observations, patient history, and epidemiological information. The expected  result is Negative.  Fact Sheet for Patients:  EntrepreneurPulse.com.au  Fact Sheet for Healthcare Providers:  IncredibleEmployment.be  This test is no t yet approved or cleared by the [place] FDA and  has been authorized for detection and/or diagnosis of SARS-CoV-2 by FDA under an Emergency Use Authorization (EUA). This EUA will remain  in effect (meaning this test can be used) for the duration of the COVID-19 declaration under Section 564(b)(1) of the Act, 21 U.S.C.section 360bbb-3(b)(1), unless the authorization is terminated  or revoked sooner.       Influenza A by PCR NEGATIVE NEGATIVE Final   Influenza B by PCR NEGATIVE NEGATIVE Final    Comment: (NOTE) The Xpert Xpress SARS-CoV-2/FLU/RSV plus assay is intended as an aid in the diagnosis of influenza from Nasopharyngeal swab specimens and should not be used as a sole basis for treatment. Nasal washings and aspirates are unacceptable for Xpert Xpress SARS-CoV-2/FLU/RSV testing.  Fact Sheet for Patients: EntrepreneurPulse.com.au  Fact Sheet for Healthcare Providers: IncredibleEmployment.be  This test is not yet approved or cleared by the [place] FDA and has been authorized for detection and/or diagnosis of SARS-CoV-2 by FDA under an Emergency Use Authorization (EUA). This EUA will remain in effect (meaning this test can be used) for the duration of the COVID-19 declaration under Section 564(b)(1) of the Act, 21 U.S.C. section 360bbb-3(b)(1), unless the authorization is terminated  or revoked.  Performed at [facility], [place] [address] [place], [place] [address]   Culture, blood (routine x 2)     Status: None (Preliminary result)   Collection Time: 04/04/21 12:50 AM   Specimen: BLOOD  Result Value Ref Range Status   Specimen Description   Final    BLOOD LEFT ANTECUBITAL Performed at [place] [address], [place], [place] [address]    Special Requests   Final    BOTTLES DRAWN AEROBIC AND ANAEROBIC Blood Culture adequate volume Performed at [place] [address] [place], [place] [address]    Culture   Final    NO GROWTH 3 DAYS Performed at [facility], [place] [address], [place], [place] [address]    Report Status PENDING  Incomplete     Labs: BNP (last 3 results) Recent Labs    11/06/20 0450  BNP 287.8*   Basic Metabolic Panel: Recent Labs  Lab 04/03/21 2120 04/04/21 0050 04/05/21 0442 04/05/21 2349 04/07/21 0454  NA 136 135 136 137 138  K 3.5 3.6 3.8 4.3 4.3  CL 103 103 105 105 106  CO2 $Re'26 26 26 24 26  'dqg$ GLUCOSE 232* 207* 201* 377* 253*  BUN $Re'14 10 11 14 14  'Wbv$ CREATININE 0.42* 0.49* 0.39* 0.56* 0.43*  CALCIUM 8.5* 8.5* 8.7* 8.8* 8.9  MG  --   --   --   --  2.0   Liver Function Tests: Recent Labs  Lab 04/03/21 2120  AST 18  ALT 38  ALKPHOS 64  BILITOT 1.0  PROT 6.0*  ALBUMIN 2.9*   No results for input(s): LIPASE, AMYLASE in the last 168 hours. No results for input(s): AMMONIA in the last 168 hours. CBC: Recent Labs  Lab 04/03/21 2120 04/04/21 0050 04/04/21 1354 04/05/21 0442 04/05/21 1435 04/05/21 2349 04/06/21 1039 04/07/21 0454  WBC 13.7* 13.9*  --  14.2*  --  11.6* 11.2* 12.3*  NEUTROABS 5.7  --   --   --   --  7.4  --   --  HGB 5.7* 5.6*   < > 7.2* 8.2* 7.9* 8.9* 7.9*  HCT 17.5* 16.9*   < > 21.3* 23.9* 23.2* 26.3* 23.7*  MCV 93.1 89.9  --  88.4  --  92.1 90.4 88.8  PLT 280 221  --  253  --  237 261 248   < > = values in this interval not  displayed.   Cardiac Enzymes: No results for input(s): CKTOTAL, CKMB, CKMBINDEX, TROPONINI in the last 168 hours. BNP: Invalid input(s): POCBNP CBG: Recent Labs  Lab 04/06/21 1151 04/06/21 1612 04/06/21 2041 04/07/21 0726 04/07/21 1124  GLUCAP 261* 215* 251* 249* 244*   D-Dimer No results for input(s): DDIMER in the last 72 hours. Hgb A1c No results for input(s): HGBA1C in the last 72 hours. Lipid Profile No results for input(s): CHOL, HDL, LDLCALC, TRIG, CHOLHDL, LDLDIRECT in the last 72 hours. Thyroid function studies No results for input(s): TSH, T4TOTAL, T3FREE, THYROIDAB in the last 72 hours.  Invalid input(s): FREET3 Anemia work up No results for input(s): VITAMINB12, FOLATE, FERRITIN, TIBC, IRON, RETICCTPCT in the last 72 hours. Urinalysis    Component Value Date/Time   COLORURINE YELLOW 04/03/2021 1932   APPEARANCEUR CLEAR 04/03/2021 1932   LABSPEC 1.016 04/03/2021 1932   PHURINE 7.0 04/03/2021 1932   GLUCOSEU >=500 (A) 04/03/2021 1932   HGBUR NEGATIVE 04/03/2021 1932   BILIRUBINUR NEGATIVE 04/03/2021 1932   BILIRUBINUR negative 10/29/2017 1428   KETONESUR NEGATIVE 04/03/2021 1932   PROTEINUR NEGATIVE 04/03/2021 1932   UROBILINOGEN 0.2 10/29/2017 1428   NITRITE NEGATIVE 04/03/2021 1932   LEUKOCYTESUR NEGATIVE 04/03/2021 1932   Sepsis Labs Invalid input(s): PROCALCITONIN,  WBC,  LACTICIDVEN Microbiology Recent Results (from the past 240 hour(s))  Culture, blood (routine x 2)     Status: None (Preliminary result)   Collection Time: 04/03/21  7:32 PM   Specimen: BLOOD  Result Value Ref Range Status   Specimen Description   Final    BLOOD BLOOD RIGHT HAND Performed at [facility], [address], [place], [place] [address]    Special Requests   Final    BOTTLES DRAWN AEROBIC AND ANAEROBIC Blood Culture adequate volume Performed at [facility], [address] [place], [place] [address]    Culture   Final    NO  GROWTH 3 DAYS Performed at [facility], [address], [place], [place] [address]    Report Status PENDING  Incomplete  Resp Panel by RT-PCR (Flu A&B, Covid) Nasopharyngeal Swab     Status: None   Collection Time: 04/03/21  7:33 PM   Specimen: Nasopharyngeal Swab; Nasopharyngeal(NP) swabs in vial transport medium  Result Value Ref Range Status   SARS Coronavirus 2 by RT PCR NEGATIVE NEGATIVE Final    Comment: (NOTE) SARS-CoV-2 target nucleic acids are NOT DETECTED.  The SARS-CoV-2 RNA is generally detectable in upper respiratory specimens during the acute phase of infection. The lowest concentration of SARS-CoV-2 viral copies this assay can detect is 138 copies/mL. A negative result does not preclude SARS-Cov-2 infection and should not be used as the sole basis for treatment or other patient management decisions. A negative result may occur with  improper specimen collection/handling, submission of specimen other than nasopharyngeal swab, presence of viral mutation(s) within the areas targeted by this assay, and inadequate number of viral copies(<138 copies/mL). A negative result must be combined with clinical observations, patient history, and epidemiological information. The expected result is Negative.  Fact Sheet for Patients:  EntrepreneurPulse.com.au  Fact Sheet for Healthcare Providers:  IncredibleEmployment.be  This test is no t yet approved or cleared by the [place] FDA and  has been authorized for detection and/or diagnosis of SARS-CoV-2 by FDA under an Emergency Use Authorization (EUA). This EUA will remain  in effect (meaning this test can be used) for the duration of the COVID-19 declaration under Section 564(b)(1) of the Act, 21 U.S.C.section 360bbb-3(b)(1), unless the authorization is terminated  or revoked sooner.       Influenza A by PCR NEGATIVE NEGATIVE Final   Influenza B by PCR NEGATIVE NEGATIVE Final     Comment: (NOTE) The Xpert Xpress SARS-CoV-2/FLU/RSV plus assay is intended as an aid in the diagnosis of influenza from Nasopharyngeal swab specimens and should not be used as a sole basis for treatment. Nasal washings and aspirates are unacceptable for Xpert Xpress SARS-CoV-2/FLU/RSV testing.  Fact Sheet for Patients: EntrepreneurPulse.com.au  Fact Sheet for Healthcare Providers: IncredibleEmployment.be  This test is not yet approved or cleared by the [place] FDA and has been authorized for detection and/or diagnosis of SARS-CoV-2 by FDA under an Emergency Use Authorization (EUA). This EUA will remain in effect (meaning this test can be used) for the duration of the COVID-19 declaration under Section 564(b)(1) of the Act, 21 U.S.C. section 360bbb-3(b)(1), unless the authorization is terminated or revoked.  Performed at [facility], [place] [address], [place], [place] [address]   Culture, blood (routine x 2)     Status: None (Preliminary result)   Collection Time: 04/04/21 12:50 AM   Specimen: BLOOD  Result Value Ref Range Status   Specimen Description   Final    BLOOD LEFT ANTECUBITAL Performed at [place] [address] [place], [place] [address]    Special Requests   Final    BOTTLES DRAWN AEROBIC AND ANAEROBIC Blood Culture adequate volume Performed at [place] [address], [place], [place] [address]    Culture   Final    NO GROWTH 3 DAYS Performed at [facility], [place] [address], [place], [place] [address]    Report Status PENDING  Incomplete    Procedures/Studies: CT BIOPSY  Result Date: May 01, 2021 INDICATION: 80 year old male with a history of chronic large granular lymphocytic leukemia with persistent anemia and developing lymphocytosis concerning for possible transformation into a more aggressive process. He presents for CT-guided bone marrow  biopsy. EXAM: CT GUIDED BONE MARROW ASPIRATION AND CORE BIOPSY Interventional Radiologist:  Criselda Peaches, MD MEDICATIONS: None. ANESTHESIA/SEDATION: Moderate (conscious) sedation was employed during this procedure. A total of 1 milligrams versed and 50 micrograms fentanyl were administered intravenously. The patient's level of consciousness and vital signs were monitored continuously by radiology nursing throughout the procedure under my direct supervision. Total monitored sedation time: 10 minutes FLUOROSCOPY TIME:  None. COMPLICATIONS: None immediate. Estimated blood loss: <25 mL PROCEDURE: Informed written consent was obtained from the patient after a thorough discussion of the procedural risks, benefits and alternatives. All questions were addressed. Maximal Sterile Barrier Technique was utilized including caps, mask, sterile gowns, sterile gloves, sterile drape, hand hygiene and skin antiseptic. A timeout was performed prior to the initiation of the procedure. The patient was positioned prone and non-contrast localization CT was performed of the pelvis to demonstrate the iliac marrow spaces. Maximal barrier sterile technique utilized including caps, mask, sterile gowns, sterile gloves, large sterile drape, hand hygiene, and betadine prep. Under sterile conditions and local anesthesia, an 11 gauge  coaxial bone biopsy needle was advanced into the right iliac marrow space. Needle position was confirmed with CT imaging. Initially, bone marrow aspiration was performed. Next, the 11 gauge outer cannula was utilized to obtain a right iliac bone marrow core biopsy. Needle was removed. Hemostasis was obtained with compression. The patient tolerated the procedure well. Samples were prepared with the cytotechnologist. IMPRESSION: Successful right iliac bone marrow aspiration and core biopsy. Electronically Signed   By: Jacqulynn Cadet M.D.   On: 04/06/2021 11:01   DG Chest Port 1 View  Result Date:  04/03/2021 CLINICAL DATA:  Weakness.  Fever.  History of leukemia.  Ex-smoker. EXAM: PORTABLE CHEST 1 VIEW COMPARISON:  Chest x-ray 03/22/2021, CT chest 07/29/2018 FINDINGS: Wireless cardiac device overlies the left chest. The heart and mediastinal contours are unchanged. Aortic calcification. No focal consolidation. Coarsened interstitial markings pulmonary edema. No pleural effusion. No pneumothorax. No acute osseous abnormality. IMPRESSION: 1. No active disease. 2. Aortic Atherosclerosis (ICD10-I70.0) and Emphysema (ICD10-J43.9). Electronically Signed   By: Iven Finn M.D.   On: 04/03/2021 20:22   DG Chest Port 1 View  Result Date: 03/22/2021 CLINICAL DATA:  Shortness of breath and fatigue. EXAM: PORTABLE CHEST 1 VIEW COMPARISON:  01/21/2021 FINDINGS: Findings the cardiac silhouette, mediastinal and hilar contours are within normal limits and stable. Stable left-sided loop recorder. Stable emphysematous changes and pulmonary scarring but no acute overlying pulmonary process. IMPRESSION: Emphysematous changes and pulmonary scarring but no acute overlying pulmonary process. Electronically Signed   By: Marijo Sanes M.D.   On: 03/22/2021 21:19   CT BONE MARROW BIOPSY & ASPIRATION  Result Date: 04/06/2021 INDICATION: 80 year old male with a history of chronic large granular lymphocytic leukemia with persistent anemia and developing lymphocytosis concerning for possible transformation into a more aggressive process. He presents for CT-guided bone marrow biopsy. EXAM: CT GUIDED BONE MARROW ASPIRATION AND CORE BIOPSY Interventional Radiologist:  Criselda Peaches, MD MEDICATIONS: None. ANESTHESIA/SEDATION: Moderate (conscious) sedation was employed during this procedure. A total of 1 milligrams versed and 50 micrograms fentanyl were administered intravenously. The patient's level of consciousness and vital signs were monitored continuously by radiology nursing throughout the procedure under my direct  supervision. Total monitored sedation time: 10 minutes FLUOROSCOPY TIME:  None. COMPLICATIONS: None immediate. Estimated blood loss: <25 mL PROCEDURE: Informed written consent was obtained from the patient after a thorough discussion of the procedural risks, benefits and alternatives. All questions were addressed. Maximal Sterile Barrier Technique was utilized including caps, mask, sterile gowns, sterile gloves, sterile drape, hand hygiene and skin antiseptic. A timeout was performed prior to the initiation of the procedure. The patient was positioned prone and non-contrast localization CT was performed of the pelvis to demonstrate the iliac marrow spaces. Maximal barrier sterile technique utilized including caps, mask, sterile gowns, sterile gloves, large sterile drape, hand hygiene, and betadine prep. Under sterile conditions and local anesthesia, an 11 gauge coaxial bone biopsy needle was advanced into the right iliac marrow space. Needle position was confirmed with CT imaging. Initially, bone marrow aspiration was performed. Next, the 11 gauge outer cannula was utilized to obtain a right iliac bone marrow core biopsy. Needle was removed. Hemostasis was obtained with compression. The patient tolerated the procedure well. Samples were prepared with the cytotechnologist. IMPRESSION: Successful right iliac bone marrow aspiration and core biopsy. Electronically Signed   By: Jacqulynn Cadet M.D.   On: 04/06/2021 11:01   VAS [place] LOWER EXTREMITY VENOUS (DVT)  Result Date: 03/19/2021  Lower Venous DVT Study  Patient Name:  BRIGGS EDELEN  Date of Exam:   03/18/2021 Medical Rec #: [id number]          Accession #:    [id number] Date of Birth: 05/16/1941           Patient Gender: M Patient Age:   56 years Exam Location:  [facility] Procedure:      VAS [place] LOWER EXTREMITY VENOUS (DVT) Referring Phys: Sullivan Lone --------------------------------------------------------------------------------  Indications: Edema.   Limitations: Poor ultrasound/tissue interface. Comparison Study: No prior study Performing Technologist: Maudry Mayhew MHA, RDMS, RVT, RDCS  Examination Guidelines: A complete evaluation includes B-mode imaging, spectral Doppler, color Doppler, and power Doppler as needed of all accessible portions of each vessel. Bilateral testing is considered an integral part of a complete examination. Limited examinations for reoccurring indications may be performed as noted. The reflux portion of the exam is performed with the patient in reverse Trendelenburg.  +---------+---------------+---------+-----------+----------+--------------+ RIGHT    CompressibilityPhasicitySpontaneityPropertiesThrombus Aging +---------+---------------+---------+-----------+----------+--------------+ CFV      Full           Yes      Yes                                 +---------+---------------+---------+-----------+----------+--------------+ SFJ      Full                                                        +---------+---------------+---------+-----------+----------+--------------+ FV Prox  Full                                                        +---------+---------------+---------+-----------+----------+--------------+ FV Mid   Full                                                        +---------+---------------+---------+-----------+----------+--------------+ FV DistalFull                                                        +---------+---------------+---------+-----------+----------+--------------+ PFV      Full                                                        +---------+---------------+---------+-----------+----------+--------------+ POP      Full           Yes      Yes                                 +---------+---------------+---------+-----------+----------+--------------+ PTV      Full                                                         +---------+---------------+---------+-----------+----------+--------------+  PERO     Full                                                        +---------+---------------+---------+-----------+----------+--------------+   +---------+---------------+---------+-----------+----------+--------------+ LEFT     CompressibilityPhasicitySpontaneityPropertiesThrombus Aging +---------+---------------+---------+-----------+----------+--------------+ CFV      Full           Yes      Yes                                 +---------+---------------+---------+-----------+----------+--------------+ SFJ      Full                                                        +---------+---------------+---------+-----------+----------+--------------+ FV Prox  Full                                                        +---------+---------------+---------+-----------+----------+--------------+ FV Mid   Full                                                        +---------+---------------+---------+-----------+----------+--------------+ FV DistalFull                                                        +---------+---------------+---------+-----------+----------+--------------+ PFV      Full                                                        +---------+---------------+---------+-----------+----------+--------------+ POP      Full           Yes      Yes                                 +---------+---------------+---------+-----------+----------+--------------+ PTV      Full                                                        +---------+---------------+---------+-----------+----------+--------------+ PERO     Full                                                        +---------+---------------+---------+-----------+----------+--------------+  Summary: BILATERAL: - No evidence of deep vein thrombosis seen in the lower extremities, bilaterally. -No evidence of  popliteal cyst, bilaterally.   *See table(s) above for measurements and observations. Electronically signed by Harold Barban MD on 03/19/2021 at 1:20:53 PM.    Final      Time coordinating discharge: Over 57 minutes    Dwyane Dee, MD  [facility] 04/07/2021, 4:51 PM

## 2021-04-08 ENCOUNTER — Encounter: Payer: Self-pay | Admitting: Cardiovascular Disease

## 2021-04-08 ENCOUNTER — Ambulatory Visit: Payer: Medicare HMO | Admitting: Cardiovascular Disease

## 2021-04-08 ENCOUNTER — Other Ambulatory Visit: Payer: Self-pay

## 2021-04-08 VITALS — BP 129/62 | HR 61 | Ht 69.0 in | Wt 157.4 lb

## 2021-04-08 DIAGNOSIS — R55 Syncope and collapse: Secondary | ICD-10-CM | POA: Diagnosis not present

## 2021-04-08 DIAGNOSIS — Z794 Long term (current) use of insulin: Secondary | ICD-10-CM

## 2021-04-08 DIAGNOSIS — Z79899 Other long term (current) drug therapy: Secondary | ICD-10-CM

## 2021-04-08 DIAGNOSIS — Z5181 Encounter for therapeutic drug level monitoring: Secondary | ICD-10-CM | POA: Diagnosis not present

## 2021-04-08 DIAGNOSIS — D539 Nutritional anemia, unspecified: Secondary | ICD-10-CM

## 2021-04-08 DIAGNOSIS — E1165 Type 2 diabetes mellitus with hyperglycemia: Secondary | ICD-10-CM | POA: Diagnosis not present

## 2021-04-08 DIAGNOSIS — I700 Atherosclerosis of aorta: Secondary | ICD-10-CM

## 2021-04-08 DIAGNOSIS — I480 Paroxysmal atrial fibrillation: Secondary | ICD-10-CM

## 2021-04-08 DIAGNOSIS — C91Z0 Other lymphoid leukemia not having achieved remission: Secondary | ICD-10-CM

## 2021-04-08 LAB — BPAM RBC
Blood Product Expiration Date: 202210022359
ISSUE DATE / TIME: 202209011303
Unit Type and Rh: 5100

## 2021-04-08 LAB — TYPE AND SCREEN
ABO/RH(D): O POS
Antibody Screen: NEGATIVE
Unit division: 0

## 2021-04-08 NOTE — Patient Instructions (Signed)
Medication Instructions:  Continue same medications *If you need a refill on your cardiac medications before your next appointment, please call your pharmacy*   Lab Work: None ordered   Testing/Procedures: None ordered   Follow-Up: At [facility], you and your health needs are our priority.  As part of our continuing mission to provide you with exceptional heart care, we have created designated Provider Care Teams.  These Care Teams include your primary Cardiologist (physician) and Advanced Practice Providers (APPs -  Physician Assistants and Nurse Practitioners) who all work together to provide you with the care you need, when you need it.  We recommend signing up for the patient portal called "MyChart".  Sign up information is provided on this After Visit Summary.  MyChart is used to connect with patients for Virtual Visits (Telemedicine).  Patients are able to view lab/test results, encounter notes, upcoming appointments, etc.  Non-urgent messages can be sent to your provider as well.   To learn more about what you can do with MyChart, go to [email or web address].     Your next appointment:  3 months   The format for your next appointment: Office   Provider:  Dr.Croitoru

## 2021-04-09 ENCOUNTER — Encounter: Payer: Self-pay | Admitting: Cardiovascular Disease

## 2021-04-09 LAB — CULTURE, BLOOD (ROUTINE X 2)
Culture: NO GROWTH
Culture: NO GROWTH
Special Requests: ADEQUATE
Special Requests: ADEQUATE

## 2021-04-09 NOTE — Progress Notes (Signed)
Cardiology Office Note:    Date:  04/09/2021   ID:  Tommy Peterson, DOB [birth year], MRN [id number]  PCP:  [name], DO  Cardiologist:  Coco Sharpnack  Chief Complaint  Patient presents with   Atrial Fibrillation      History of Present Illness:    Tommy Peterson is a 80 y.o. male with a hx of large granular lymphocytic leukemia, transfusion-dependent anemia, diabetes mellitus, initially diagnosed with paroxysmal atrial fibrillation during pneumonia in July 2019, reconfirmed in January 2022 and again February 2022 (amiodarone started).    He has had deteriorating health recently.  He used to require 2 units of packed red blood cell transfusion every 2 weeks, but recently he developed severe symptomatic anemia sooner than the 2-week scheduled transfusion.  He was hospitalized for anemia with a hemoglobin of 5.4 on August 16.  He received 2 units then.  He was hospitalized again on September 1 with a hemoglobin of 5.7.  He received a total of 3 units of blood.  Most recent hemoglobin was 7.9.  He has normal platelet and WBC counts and normal renal function.  He is not on anticoagulants.  He has just undergone a bone marrow biopsy earlier this week, but the results are not available.  During these hospital stays, he maintained sinus rhythm.  He was hospitalized 04/02-04/04 for UTI-related E.coli sepsis. He has chronic diarrhea (predates hospitalization and antibiotic, presumed collagenous colitis and/or celiac disease),   He maintained sinus rhythm during the hospital stay.  Liver function tests were normal on 04/03/2021.  TSH was normal on 02/25/2021.  He has had previous syncope due to orthostatic hypotension.  Earlier today, he was bending over and tipped over and hit his head and has a superficial laceration to his right frontal area.  He refused to go to the emergency room or be otherwise evaluated for it.  He did not lose consciousness.  He has had well-documented orthostatic  hypotension and his diastolic blood pressure often runs in the 50s.    He feels weak.  At rest he does not have shortness of breath, but he tires very easily.  He bruises easily as well.  He is not aware of any palpitations.  He has mild pretibial and pedal edema but does not like to take diuretics due to urinary frequency.  He does not have any chest pain.  He does not have palpitations, but he has never been aware of the arrhythmia when it occurred in the past.  It has been documented on his loop recorder.  He has requested that we not perform routine downloads on his loop recorder due to the cost.  Since we have decided to forego anticoagulation, this has become less of an issue.  Today's ECG shows sinus rhythm with frequent PACs.  At his last appointment in July he was in atrial fibrillation with mild RVR.  CHA2DS2-VASc Score = 29 (old stroke, age 19, HTN -questionable dx, DM, atherosclerosis on imaging - but no clinical CAD/PAD), 7.2% stroke risk per year.  At best, has a CHA2DS2-VASc score of 5.  HASBLED 2.    He does not have a history of coronary artery disease or peripheral arterial disease.  In 2017 he was admitted with an episode of chest pain and his work-up for ischemia was negative.  Nevertheless, there was evidence of aortic and coronary calcifications on a CT of the chest performed in January 2018.  Past Medical History:  Diagnosis Date   Acute respiratory  failure ([place])    BCC (basal cell carcinoma of skin) 08/15/2016   w/SCC Left Forehead (treatment @ [facility])   BPH (benign prostatic hyperplasia)    Carpal tunnel syndrome on both sides    Chronic diarrhea    intermittant due to chronic collagenous colitis   Chronic large granular lymphocytic leukemia (HCC) primary hemotologist-  [name]. 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--   treatment weekly methotraxate and transfusion's (last PRBCs 07-19-2017)   Collagenous colitis    chronic--- intermittant between diarrahea and constipation   ED (erectile dysfunction)    HCAP (healthcare-associated pneumonia)    History of adenomatous polyp of colon    History of Clostridium difficile colitis 05/2016   History of pneumonia 11/08/2017   CAP -- LLL---  12-19-2017  per pt no residual symptoms   History of SCC (squamous cell carcinoma) of skin    History of sepsis    11-20-2017 severe sepsis due to UTI;  10/ 2017sepsis due to c-diff colitis   Leucocytosis    chronic   Lower urinary tract symptoms (LUTS)    Macrocytic anemia    since 02/ 2015   MGUS (monoclonal gammopathy of unknown significance)    hemotologist-  [name] @ [facility]   Monoclonal paraproteinemia    Neuropathy, peripheral    Nodular basal cell carcinoma (BCC) 10/01/2018   Left Neck(Nodular) Pt Does Not Want Treatment   Nodular basal cell carcinoma (BCC) 10/01/2018   Mid Forehead (treament curet and excision)   OA (osteoarthritis)    Prostatic stone    Rash of face    RIGHT SIDE   Raynaud's syndrome    Recurrent BCC (basal cell carcinoma) 03/05/2019   positive margin   SCC (squamous cell carcinoma) 08/10/2009   Central Forehead (Moh's Dr. Rhona Raider @ [facility])   SCC (squamous cell carcinoma) 12/08/2011   CIS-Left Cheek (treatment [facility] @ [facility])   SCC (squamous cell carcinoma) 07/09/2015   CIS-Left Forehead (treament @ [facility])   Septic shock ([place])    x 3 w/UTI   Squamous cell carcinoma in situ (SCCIS) 12/08/2011   Left Cheek (treatment Aldara @ [facility])    Superficial basal cell carcinoma ([place]) 10/01/2018   Left Shin-Pt does not want treatment   Thin skin    fragile   Transfusion-dependent anemia since 10/ 2017   last transfusion PRBCs 07-19-2017  per hemologist note dated 10-25-2017   Type 2 diabetes mellitus treated with insulin ([place])    followed by [name] (pcp)   Urinary  hesitancy    Wears contact lenses    LEFT EYE ONLY    Past Surgical History:  Procedure Laterality Date   BONE MARROW BIOPSY Right 05-24-2016;  08-20-2014   CARDIOVASCULAR STRESS TEST  11-26-2012   [name]  @ [facility] ([place]   normal nuclear study w/ no ischemia/  normal LV function and wall motion , ef 60% (find in care everywhere,epic)   CARDIOVERSION N/A 08/31/2020   Procedure: CARDIOVERSION;  Surgeon: [name], MD;  Location: [facility];  Service: Cardiovascular;  Laterality: N/A;   CATARACT EXTRACTION W/ INTRAOCULAR LENS  IMPLANT, BILATERAL  2015   CYSTOSCOPY WITH LITHOLAPAXY N/A 12/25/2017   Procedure: CYSTOSCOPY WITH LITHOLAPAXY AND FULGERATION;  Surgeon: [name], MD;  Location: [facility];  Service: Urology;  Laterality:  N/A;   INGUINAL HERNIA REPAIR Right 1980s   LOOP RECORDER INSERTION N/A 04/10/2018   Procedure: LOOP RECORDER INSERTION;  Surgeon: [name], MD;  Location: [facility];  Service: Cardiovascular;  Laterality: N/A;   PENILE PROSTHESIS IMPLANT  12-02-2015   dr [name] @ [facility] in [place] Left 08/2016   TEE WITHOUT CARDIOVERSION N/A 08/31/2020   Procedure: TRANSESOPHAGEAL ECHOCARDIOGRAM (TEE);  Surgeon: [name], MD;  Location: [facility];  Service: Cardiovascular;  Laterality: N/A;   TONSILLECTOMY  child   TRANSURETHRAL RESECTION OF PROSTATE  2009   AND REPAIR RECURRENT RIGHT INGUINAL HERNIA    Current Medications: Current Meds  Medication Sig   acetaminophen (TYLENOL) 325 MG tablet Take 650 mg by mouth 2 (two) times daily as needed for fever.   amiodarone (PACERONE) 200 MG tablet Take 1 tablet (200 mg total) by mouth daily.   aspirin 325 MG EC tablet Take 325 mg by mouth 3 (three) times a week.   B Complex Vitamins (B COMPLEX 100 PO) Take 1 tablet by mouth daily.   BD PEN NEEDLE NANO 2ND GEN 32G X 4 MM MISC    budesonide (ENTOCORT EC) 3 MG 24 hr capsule Take 9  mg by mouth in the morning.   Continuous Blood Gluc Sensor (FREESTYLE LIBRE 2 SENSOR) MISC Inject 1 patch into the skin every 14 (fourteen) days.   Cranberry 400 MG TABS Take 400 mg by mouth 2 (two) times daily.   Cyanocobalamin (VITAMIN B-12) 5000 MCG SUBL Place 5,000 mcg under the tongue daily.   D-Mannose 500 MG CAPS Take 500 mg by mouth in the morning and at bedtime.   doxycycline (VIBRAMYCIN) 100 MG capsule Take 1 capsule (100 mg total) by mouth at bedtime.   folic acid (FOLVITE) 1 MG tablet TAKE 1 TABLET BY MOUTH EVERY DAY (Patient taking differently: Take 1 mg by mouth daily with breakfast.)   furosemide (LASIX) 20 MG tablet Take 20 mg for a weight 159 or below. Take 40 mg (2 tablets) for a weight over 160 lbs. (Patient taking differently: Take 20 mg by mouth in the morning.)   metoprolol tartrate (LOPRESSOR) 50 MG tablet Take 50 mg by mouth in the morning and at bedtime.   Misc Natural Products (PROSTATE HEALTH) CAPS Take 1 capsule by mouth daily with breakfast.   NOVOLOG FLEXPEN 100 UNIT/ML FlexPen Inject 1-15 Units into the skin 3 (three) times daily with meals. Glucose 121 - 150: 2 units, Glucose 151 - 200: 3 units, Glucose 201 - 250: 5 units, Glucose 251 - 300: 8 units, Glucose 301 - 350: 11 units, Glucose 351 - 400: 15 units, Glucose > 400 call MD   omeprazole (PRILOSEC) 40 MG capsule Take 40 mg by mouth at bedtime.   oxybutynin (DITROPAN) 5 MG tablet Take 5 mg by mouth in the morning.   pioglitazone (ACTOS) 15 MG tablet Take 15 mg by mouth every morning.   SOLIQUA 100-33 UNT-MCG/ML SOPN Inject 23 Units into the skin at bedtime.     Allergies:   Bee venom, Metformin and related, Sulfamethoxazole-trimethoprim, Bee pollen, and Metformin hcl   Social History   Socioeconomic History   Marital status: Widowed    Spouse name: engaged   Number of children: 8   Years of education: college-2   Highest education level: Not on file  Occupational History   Occupation: Press photographer    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  Tobacco Use  Smoking status: Former    Years: 20.00    Types: Cigarettes    Quit date: 05/04/1996    Years since quitting: 24.9   Smokeless tobacco: Never  Vaping Use   Vaping Use: Never used  Substance and Sexual Activity   Alcohol use: Yes    Comment: 1-2 glasses of wine every other day   Drug use: No   Sexual activity: Never  Other Topics Concern   Not on file  Social History Narrative   Not on file   Social Determinants of Health   Financial Resource Strain: Not on file  Food Insecurity: Not on file  Transportation Needs: Not on file  Physical Activity: Not on file  Stress: Not on file  Social Connections: Not on file     Family History: The patient's family history includes Diabetes in his mother; Heart failure in his mother.  ROS:   Please see the history of present illness.   All other systems are reviewed and are negative.  EKGs/Labs/Other Studies Reviewed:    The following studies were reviewed today: Comprehensive download of his loop recorder in the office was performed 02/21/2021.  Echo 11/06/2020  1. Left ventricular ejection fraction, by estimation, is 60 to 65%. The  left ventricle has normal function. The left ventricle has no regional  wall motion abnormalities. The left ventricular internal cavity size was  mildly dilated. Left ventricular  diastolic parameters are consistent with Grade II diastolic dysfunction  (pseudonormalization).   2. Right ventricular systolic function is normal. The right ventricular  size is normal. There is moderately elevated pulmonary artery systolic  pressure.   3. Left atrial size was mild to moderately dilated.   4. Right atrial size was mild to moderately dilated.   5. The pericardial effusion is circumferential.   6. The mitral valve is normal in structure. Mild to moderate mitral valve  regurgitation. No evidence of mitral stenosis.   7. The aortic valve is tricuspid. There  is mild calcification of the  aortic valve. There is mild thickening of the aortic valve. Aortic valve  regurgitation is not visualized. Mild to moderate aortic valve  sclerosis/calcification is present, without any  evidence of aortic stenosis.   8. The inferior vena cava is dilated in size with >50% respiratory  variability, suggesting right atrial pressure of 8 mmHg.   Comparison(s): The left ventricular function has improved.   EKG:  EKG is ordered today.  It shows sinus rhythm with frequent PACs around 60 bpm.  He has an incomplete left bundle branch block with a QRS around 114 ms.  The QTC is normal at 448 ms.  Recent Labs: 11/06/2020: B Natriuretic Peptide 389.9 02/25/2021: TSH 1.060 04/03/2021: ALT 38 04/07/2021: BUN 14; Creatinine, Ser 0.43; Hemoglobin 7.9; Magnesium 2.0; Platelets 248; Potassium 4.3; Sodium 138  Recent Lipid Panel    Component Value Date/Time   CHOL 111 05/05/2016 0402   TRIG 111 04/07/2020 0055   HDL 27 (L) 05/05/2016 0402   CHOLHDL 4.1 05/05/2016 0402   VLDL 21 05/05/2016 0402   LDLCALC 63 05/05/2016 0402    Physical Exam:    VS:  BP 129/62   Pulse 61   Ht $R'5\' 9"'Ow$  (1.753 m)   Wt 157 lb 6.4 oz (71.4 kg)   SpO2 96%   BMI 23.24 kg/m     Wt Readings from Last 3 Encounters:  04/08/21 157 lb 6.4 oz (71.4 kg)  04/03/21 154 lb (69.9 kg)  03/28/21 154 lb (  69.9 kg)     General: Alert, oriented x3, no distress, pale Head: Medium sized Band-Aid on his right forehead area.  No active bleeding and relatively small area of bruising, PERRL, EOMI, no exophtalmos or lid lag, no myxedema, no xanthelasma; normal ears, nose and oropharynx Neck: normal jugular venous pulsations and no hepatojugular reflux; brisk carotid pulses without delay and no carotid bruits Chest: clear to auscultation, no signs of consolidation by percussion or palpation, normal fremitus, symmetrical and full respiratory excursions Cardiovascular: normal position and quality of the apical impulse,  regular rhythm, normal first and second heart sounds, no murmurs, rubs or gallops Abdomen: no tenderness or distention, no masses by palpation, no abnormal pulsatility or arterial bruits, normal bowel sounds, no hepatosplenomegaly Extremities: no clubbing, cyanosis or edema; 2+ radial, ulnar and brachial pulses bilaterally; 2+ right femoral, posterior tibial and dorsalis pedis pulses; 2+ left femoral, posterior tibial and dorsalis pedis pulses; no subclavian or femoral bruits Neurological: grossly nonfocal Psych: Normal mood and affect  CHA2DS2-VASc Score = 7  The patient's score is based upon: CHF History: No HTN History: Yes Diabetes History: Yes Stroke History: Yes Vascular Disease History: Yes Age Score: 2 Gender Score: 0      ASSESSMENT AND PLAN: Paroxysmal Atrial Fibrillation (ICD10:  I48.0) The patient's CHA2DS2-VASc score is 7, indicating a 11.2% annual risk of stroke.    Secondary Hypercoagulable State (ICD10:  D68.69) The patient is at significant risk for stroke/thromboembolism based upon his CHA2DS2-VASc Score of 7.  Continue Apixaban (Eliquis).    ASSESSMENT:    1. Paroxysmal atrial fibrillation (HCC)   2. Encounter for monitoring amiodarone therapy   3. Syncope and collapse   4. Large granular lymphocytic leukemia ([place])   5. Macrocytic anemia   6. Type 2 diabetes mellitus with hyperglycemia, with long-term current use of insulin (HCC)   7. Aortic atherosclerosis (HCC)      PLAN:    In order of problems listed above:  AFib: He has never been aware of the arrhythmia.  Arrhythmia burden has decreased substantially while on amiodarone.  We have had to stop anticoagulation due to his severe anemia and I am also worried about his falls.  I do not think he is a good candidate for the watchman device, unfortunately, due to his comorbidities.   Amiodarone: check LFTs and TSH at least every 6 months.  Both of these have been checked and are normal within the last 6  weeks or so. ILR: Information is readily available for clinical decision-making, but I do not think he is a good candidate for resumption of anticoagulant. Syncope: He has a history of orthostatic syncope and vasovagal events.  He tends to run a low diastolic blood pressure.  Avoid dehydration/excessive diuretic use and sudden changes in position.  He has a weight-based prescription for a very low-dose of furosemide for his edema. Leukemia/chronic anemia: He is requiring increasingly frequent blood transfusions.  Decompensates between the scheduled sessions.  Recently had a bone marrow biopsy but no results available yet.  Followed by Dr. Irene Limbo. DM: control continues to deteriorate and his most recent hemoglobin A1c has increased to 9.8%.  He is on a tiny dose of Actos.  Personally, I would prefer alternative treatment since he is having issues with edema.  Does have aortic and coronary atherosclerosis on imaging studies, but no clinical CAD/PAD. Unlikely to tolerate SGLT2 inh due to frequent urination and infections. Allergic to metformin. Encouraged compliance with insulin. Aortic and coronary atherosclerosis: Noted  on CT chest.  He has never had angina pectoris but has never had any functional studies for CAD.  He is not a good candidate for any type of invasive procedure with the severe recurrent anemia and he would not tolerate antiplatelet therapy at this point. Recent E.coli sepsis: In the last few years has had 4 episodes of sepsis with E. coli.  He is now taking doxycycline for chronic suppression.  Patient Instructions  Medication Instructions:  Continue same medications *If you need a refill on your cardiac medications before your next appointment, please call your pharmacy*   Lab Work: None ordered   Testing/Procedures: None ordered   Follow-Up: At [facility], you and your health needs are our priority.  As part of our continuing mission to provide you with exceptional heart  care, we have created designated Provider Care Teams.  These Care Teams include your primary Cardiologist (physician) and Advanced Practice Providers (APPs -  Physician Assistants and Nurse Practitioners) who all work together to provide you with the care you need, when you need it.  We recommend signing up for the patient portal called "MyChart".  Sign up information is provided on this After Visit Summary.  MyChart is used to connect with patients for Virtual Visits (Telemedicine).  Patients are able to view lab/test results, encounter notes, upcoming appointments, etc.  Non-urgent messages can be sent to your provider as well.   To learn more about what you can do with MyChart, go to [email or web address].     Your next appointment:  3 months   The format for your next appointment: Office   Provider:  Dr.Shreshta Medley    Medication Adjustments/Labs and Tests Ordered: Current medicines are reviewed at length with the patient today.  Concerns regarding medicines are outlined above.  No orders of the defined types were placed in this encounter.   No orders of the defined types were placed in this encounter.   Patient Instructions  Medication Instructions:  Continue same medications *If you need a refill on your cardiac medications before your next appointment, please call your pharmacy*   Lab Work: None ordered   Testing/Procedures: None ordered   Follow-Up: At [facility] you and your health needs are our priority.  As part of our continuing mission to provide you with exceptional heart care, we have created designated Provider Care Teams.  These Care Teams include your primary Cardiologist (physician) and Advanced Practice Providers (APPs -  Physician Assistants and Nurse Practitioners) who all work together to provide you with the care you need, when you need it.  We recommend signing up for the patient portal called "MyChart".  Sign up information is provided on this  After Visit Summary.  MyChart is used to connect with patients for Virtual Visits (Telemedicine).  Patients are able to view lab/test results, encounter notes, upcoming appointments, etc.  Non-urgent messages can be sent to your provider as well.   To learn more about what you can do with MyChart, go to [email or web address].     Your next appointment:  3 months   The format for your next appointment: Office   Provider:  Dr.Brent Noto      Signed, [name], MD  04/09/2021 6:17 PM    [place]

## 2021-04-12 LAB — SURGICAL PATHOLOGY

## 2021-04-14 ENCOUNTER — Encounter (HOSPITAL_COMMUNITY): Payer: Self-pay | Admitting: Interventional Radiology

## 2021-04-19 DIAGNOSIS — C91Z0 Other lymphoid leukemia not having achieved remission: Secondary | ICD-10-CM | POA: Diagnosis not present

## 2021-04-19 DIAGNOSIS — E1140 Type 2 diabetes mellitus with diabetic neuropathy, unspecified: Secondary | ICD-10-CM | POA: Diagnosis not present

## 2021-04-19 DIAGNOSIS — Z87440 Personal history of urinary (tract) infections: Secondary | ICD-10-CM | POA: Diagnosis not present

## 2021-04-19 DIAGNOSIS — D472 Monoclonal gammopathy: Secondary | ICD-10-CM | POA: Diagnosis not present

## 2021-04-19 DIAGNOSIS — Z794 Long term (current) use of insulin: Secondary | ICD-10-CM | POA: Diagnosis not present

## 2021-04-19 DIAGNOSIS — K529 Noninfective gastroenteritis and colitis, unspecified: Secondary | ICD-10-CM | POA: Diagnosis not present

## 2021-04-19 DIAGNOSIS — E119 Type 2 diabetes mellitus without complications: Secondary | ICD-10-CM | POA: Diagnosis not present

## 2021-04-19 DIAGNOSIS — Z6822 Body mass index (BMI) 22.0-22.9, adult: Secondary | ICD-10-CM | POA: Diagnosis not present

## 2021-04-20 ENCOUNTER — Inpatient Hospital Stay: Payer: Medicare HMO

## 2021-04-20 ENCOUNTER — Other Ambulatory Visit: Payer: Self-pay

## 2021-04-20 ENCOUNTER — Inpatient Hospital Stay: Payer: Medicare HMO | Attending: Hematology

## 2021-04-20 ENCOUNTER — Encounter (HOSPITAL_COMMUNITY): Payer: Self-pay

## 2021-04-20 DIAGNOSIS — D649 Anemia, unspecified: Secondary | ICD-10-CM | POA: Insufficient documentation

## 2021-04-20 DIAGNOSIS — Z8616 Personal history of COVID-19: Secondary | ICD-10-CM | POA: Diagnosis not present

## 2021-04-20 DIAGNOSIS — M7989 Other specified soft tissue disorders: Secondary | ICD-10-CM | POA: Insufficient documentation

## 2021-04-20 DIAGNOSIS — Z79899 Other long term (current) drug therapy: Secondary | ICD-10-CM | POA: Insufficient documentation

## 2021-04-20 DIAGNOSIS — Z7982 Long term (current) use of aspirin: Secondary | ICD-10-CM | POA: Insufficient documentation

## 2021-04-20 DIAGNOSIS — D472 Monoclonal gammopathy: Secondary | ICD-10-CM | POA: Insufficient documentation

## 2021-04-20 DIAGNOSIS — Z9221 Personal history of antineoplastic chemotherapy: Secondary | ICD-10-CM | POA: Insufficient documentation

## 2021-04-20 DIAGNOSIS — C91Z2 Other lymphoid leukemia, in relapse: Secondary | ICD-10-CM | POA: Insufficient documentation

## 2021-04-20 DIAGNOSIS — C91Z0 Other lymphoid leukemia not having achieved remission: Secondary | ICD-10-CM

## 2021-04-20 DIAGNOSIS — Z87891 Personal history of nicotine dependence: Secondary | ICD-10-CM | POA: Diagnosis not present

## 2021-04-20 LAB — CBC WITH DIFFERENTIAL (CANCER CENTER ONLY)
Abs Immature Granulocytes: 0.28 10*3/uL — ABNORMAL HIGH (ref 0.00–0.07)
Basophils Absolute: 0 10*3/uL (ref 0.0–0.1)
Basophils Relative: 0 %
Eosinophils Absolute: 0 10*3/uL (ref 0.0–0.5)
Eosinophils Relative: 0 %
HCT: 25.2 % — ABNORMAL LOW (ref 39.0–52.0)
Hemoglobin: 8.2 g/dL — ABNORMAL LOW (ref 13.0–17.0)
Immature Granulocytes: 2 %
Lymphocytes Relative: 44 %
Lymphs Abs: 5.7 10*3/uL — ABNORMAL HIGH (ref 0.7–4.0)
MCH: 29.5 pg (ref 26.0–34.0)
MCHC: 32.5 g/dL (ref 30.0–36.0)
MCV: 90.6 fL (ref 80.0–100.0)
Monocytes Absolute: 1.6 10*3/uL — ABNORMAL HIGH (ref 0.1–1.0)
Monocytes Relative: 12 %
Neutro Abs: 5.6 10*3/uL (ref 1.7–7.7)
Neutrophils Relative %: 42 %
Platelet Count: 356 10*3/uL (ref 150–400)
RBC: 2.78 MIL/uL — ABNORMAL LOW (ref 4.22–5.81)
RDW: 15.1 % (ref 11.5–15.5)
WBC Count: 13.2 10*3/uL — ABNORMAL HIGH (ref 4.0–10.5)
nRBC: 0 % (ref 0.0–0.2)

## 2021-04-20 LAB — CMP (CANCER CENTER ONLY)
ALT: 60 U/L — ABNORMAL HIGH (ref 0–44)
AST: 19 U/L (ref 15–41)
Albumin: 3 g/dL — ABNORMAL LOW (ref 3.5–5.0)
Alkaline Phosphatase: 79 U/L (ref 38–126)
Anion gap: 11 (ref 5–15)
BUN: 13 mg/dL (ref 8–23)
CO2: 22 mmol/L (ref 22–32)
Calcium: 8.9 mg/dL (ref 8.9–10.3)
Chloride: 104 mmol/L (ref 98–111)
Creatinine: 0.7 mg/dL (ref 0.61–1.24)
GFR, Estimated: >60 mL/min (ref >60)
Glucose, Bld: 345 mg/dL — ABNORMAL HIGH (ref 70–99)
Potassium: 4.2 mmol/L (ref 3.5–5.1)
Sodium: 137 mmol/L (ref 135–145)
Total Bilirubin: 0.7 mg/dL (ref 0.3–1.2)
Total Protein: 5.8 g/dL — ABNORMAL LOW (ref 6.5–8.1)

## 2021-04-20 LAB — SAMPLE TO BLOOD BANK

## 2021-04-22 ENCOUNTER — Ambulatory Visit: Payer: No Typology Code available for payment source | Admitting: Hematology

## 2021-04-25 ENCOUNTER — Other Ambulatory Visit (HOSPITAL_COMMUNITY): Payer: Self-pay

## 2021-04-25 ENCOUNTER — Inpatient Hospital Stay (HOSPITAL_BASED_OUTPATIENT_CLINIC_OR_DEPARTMENT_OTHER): Payer: Medicare HMO | Admitting: Hematology

## 2021-04-25 ENCOUNTER — Other Ambulatory Visit: Payer: Self-pay

## 2021-04-25 ENCOUNTER — Inpatient Hospital Stay: Payer: Medicare HMO

## 2021-04-25 DIAGNOSIS — D472 Monoclonal gammopathy: Secondary | ICD-10-CM | POA: Diagnosis not present

## 2021-04-25 DIAGNOSIS — C91Z2 Other lymphoid leukemia, in relapse: Secondary | ICD-10-CM | POA: Diagnosis not present

## 2021-04-25 DIAGNOSIS — C91Z0 Other lymphoid leukemia not having achieved remission: Secondary | ICD-10-CM | POA: Diagnosis not present

## 2021-04-25 DIAGNOSIS — Z9221 Personal history of antineoplastic chemotherapy: Secondary | ICD-10-CM | POA: Diagnosis not present

## 2021-04-25 DIAGNOSIS — Z7982 Long term (current) use of aspirin: Secondary | ICD-10-CM | POA: Diagnosis not present

## 2021-04-25 DIAGNOSIS — D649 Anemia, unspecified: Secondary | ICD-10-CM

## 2021-04-25 DIAGNOSIS — Z79899 Other long term (current) drug therapy: Secondary | ICD-10-CM | POA: Diagnosis not present

## 2021-04-25 DIAGNOSIS — M7989 Other specified soft tissue disorders: Secondary | ICD-10-CM | POA: Diagnosis not present

## 2021-04-25 DIAGNOSIS — Z8616 Personal history of COVID-19: Secondary | ICD-10-CM | POA: Diagnosis not present

## 2021-04-25 DIAGNOSIS — Z87891 Personal history of nicotine dependence: Secondary | ICD-10-CM | POA: Diagnosis not present

## 2021-04-25 MED ORDER — CYCLOPHOSPHAMIDE 50 MG PO CAPS
50.0000 mg | ORAL_CAPSULE | Freq: Every day | ORAL | 1 refills | Status: AC
  Filled 2021-04-25 – 2021-04-28 (×2): qty 30, 30d supply, fill #0

## 2021-04-26 ENCOUNTER — Telehealth: Payer: Self-pay

## 2021-04-26 ENCOUNTER — Other Ambulatory Visit (HOSPITAL_COMMUNITY): Payer: Self-pay

## 2021-04-26 ENCOUNTER — Telehealth: Payer: Self-pay | Admitting: Hematology

## 2021-04-26 NOTE — Telephone Encounter (Signed)
Unable to leave voicemail with follow-up appointments per 9/19 los.

## 2021-04-26 NOTE — Telephone Encounter (Signed)
Oral Oncology Patient Advocate Encounter  Prior Authorization for Cytoxan has been approved.    PA# [id number] Effective dates: 04/26/21 through 04/26/22  Patients co-pay is $32.54  Oral Oncology Clinic will continue to follow.   [place] Patient [place] Phone [phone] Fax [phone] 04/26/2021 11:38 AM

## 2021-04-26 NOTE — Telephone Encounter (Signed)
Oral Oncology Pharmacist Encounter  Received new prescription for cyclophosphamide (Cytoxan) for the treatment of T-LGL planned duration until disease progression or unacceptable toxicity.  Labs from 04/20/2021 assessed, no interventions needed.  Current medication list in Epic reviewed, DDIs with amiodarone identified: cytoxan can increase the adverse/toxic effects of amiodarone, specifically the risk of pulmonary toxicity. Dose is reduced but will counsel patient and inform MD.   Evaluated chart and no patient barriers to medication adherence noted.   Patient agreement for treatment documented in MD note on 04/25/2021.  Prescription has been e-scribed to the [facility] for benefits analysis and approval.  Oral Oncology Clinic will continue to follow for insurance authorization, copayment issues, initial counseling and start date.  Drema Halon, PharmD Hematology/Oncology Clinical Pharmacist [facility] [phone]

## 2021-04-26 NOTE — Telephone Encounter (Signed)
Oral Oncology Patient Advocate Encounter   Received notification from [facility] that prior authorization for cytoxan is required.   PA submitted on CoverMyMeds Key [id number] Status is pending   Oral Oncology Clinic will continue to follow.  [place] Patient [place] Phone [phone] Fax [phone] 04/26/2021 8:51 AM

## 2021-04-28 ENCOUNTER — Other Ambulatory Visit (HOSPITAL_COMMUNITY): Payer: Self-pay

## 2021-04-28 NOTE — Telephone Encounter (Signed)
Oral Chemotherapy Pharmacist Encounter  I spoke with patient for overview of: Cytoxan for the treatment of T-LGL, planned duration until disease progression or unacceptable toxicity.   Counseled patient on administration, dosing, side effects, monitoring, drug-food interactions, safe handling, storage, and disposal.  Patient will take Cytoxan 50mg  tablets, 1 tablet (50mg ) once daily with food in the morning and with plenty of water in order to stay hydrated.   Cytoxan start date: 04/29/2021  Adverse effects include but are not limited to: decreased blood counts, nausea/ vomiting, decreased appetite, weight loss, hair loss, and kidney/ bladder issues. Patient aware to call the office if patient experiences any shortness or breath or pulmonary problems.   Reviewed with patient importance of keeping a medication schedule and plan for any missed doses. No barriers to medication adherence identified.  Medication reconciliation performed and medication/allergy list updated.  Insurance authorization for Cytoxan has been obtained. Test claim at the pharmacy revealed copayment $32.54 for 1st fill of 30 days. Patient will pick up medication from [facility] on 04/28/2021  Patient informed the pharmacy will reach out 5-7 days prior to needing next fill of Cytoxan to coordinate continued medication acquisition to prevent break in therapy.  All questions answered.  Mr. Rushlow voiced understanding and appreciation.   Medication education handout placed in mail for patient. Patient knows to call the office with questions or concerns. Oral Chemotherapy Clinic phone number provided to patient.   Drema Halon, PharmD Hematology/Oncology Clinical Pharmacist [facility] [phone]

## 2021-05-02 NOTE — Progress Notes (Addendum)
HEMATOLOGY/ONCOLOGY CLINIC NOTE  Date of Service: .04/25/2021  Patient Care Team: [name], DO as PCP - General (Family Medicine) Croitoru, Dani Gobble, MD as PCP - Cardiology (Cardiology) Collier Bullock, MD as Referring Physician (Hematology and Oncology) [name], DO as Referring Physician (Family Medicine)  CHIEF COMPLAINTS/PURPOSE OF CONSULTATION:  Large Granular Lymphocytic Leukemia  Hem/Onc History  1)- chronic leukocytosis (neutrophilia and absolute lymphocytosis), peripheral blood lymphocytes about 5000, about 1/4 of cells have LGL morphology 2)- macrocytic anemia at least since 09/08/2013 (normal CBC on 11/28/2012), MCV now as high as 115, low retics, marrow M:E ratio 15:1 in 05/2016, erythroid hypoplasia 3)- MGUS serum IgG lambda 0.4 g/dL in 07/2014, follow annually 4)- 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 weekly -03/29/2017 increase dose 25 mg po qweek -07/19/2017 increase mtx 30 mg po qweek, stable dose since then   HISTORY OF PRESENTING ILLNESS:  Tommy Peterson is a wonderful 80 y.o. male who has been referred to [place] by Dr [name] for evaluation and management of Large Granular Lymphocytic Leukemia. The pt reports that he is doing well overall.   Pt is here as he is moving his care because his previous Oncologist, Dr. [name], has changed practices. The pt reports that he needed 2 pints of blood every 2 weeks for a year beginning in 2014. Pt has never needed to any  erythropoeitin injections. His Hgb has been holding higher lately, around 10-11. It has been about 7 months since pt has needed his last blood transfusion. Pt has been taking 30 mg of Methotrexate once per week and denies any issues with that dosage. He has continued taking 1 mg Folic Acid once per day. He is also taking a daily multivitamin.    Pt was previously walking a lot but had to slow down due to fatigue. Pt will need a surgery to repair his left inguinal hernia.    In 2017 pt had a C. Diff infection. Pt had recurrent UTI infections in 2019. His last hospitalization for infections was in January 2020. There were no kidney stones or other reasons for repeat infections. Pt denies any pneumonia infections. Pt has had Diabetes for 40+ years and it is currently well controlled. Pt was never given a direct reason for chronic diarrhea but was placed on Budesonide which stopped his symptoms. Pt is currently taking 3 mg twice per week. He notes that he has been experiencing some urinary frequency, but does feel like he is completely emptying his bladder when he uses the restroom. Pt has seen a Urologist who gave him some medication that caused him to urinate up to 30 times per day. He has tried Flomax previously, and still has some at home, but notes that it does not help much. Pt is following with a Dermatologist , Dr. Rhona Raider, for his skin Squamous Cell Carcinoma. He is currently using Efudex to treat. He was having some night sweats about a year ago, but has had none recently.    Most recent lab results (08/28/2019) of CBC w/diff  and CMP is as follows: all values are WNL except for Hgb at 11.8, HCT at 35.1, MCV at 122, MCH at 41.1, RDW at 2.87, RDW at 16.0, Abs Immature Granulocyte at 0.11K, Immature Granulocyte Rel at 1.2, Mono Abs at 1.0K, Creatinine at 0.5, Glucose at 199.   On review of systems, pt reports constipation, fatigue and denies diarrhea, unexpected weight loss, fevers, chills, night  sweats, abdominal pain, leg swelling and any other symptoms.    On PMHx the pt reports BPH, Chronic Diarrhea, Chronic Large Granular Lymphocytic Leukemia, Collagenous Collitis, Clostridium defficile colitis, Chronic UTIs, Transfusion-dependant anemia, Type 2 diabetes mellitus.  INTERVAL HISTORY:   Tommy Peterson is a wonderful 80 y.o. male who is here for evaluation and management of Large Granular Lymphocytic Leukemia. The patient's last visit with [place] was on 03/09/2021.   He was hospitalized for symptomatic anemia and we helped arrange for inpatient bone marrow biopsy which he is here to discuss. His bone marrow biopsy on 04/06/2021 is consistent with persistent/residual T-LGL.  His T-LGL is now resistant to methotrexate and is causing transfusion dependent anemia.  We discussed treatment options and the next treatment option to consider would be oral Cytoxan at 50-100 mg p.o. daily  Patient notes he is agreeable to this and close lab monitoring.  He notes his diarrhea is stable with the budesonide.  He notes that his penile implant needs fixing and he shall be following up with his urologist to address this.  No significant leg swelling at this time.  On review of systems, pt reports  no fevers/chills/night sweats.  SURGICAL HISTORY: Past Surgical History:  Procedure Laterality Date   BONE MARROW BIOPSY Right 05-24-2016;  08-20-2014   CARDIOVASCULAR STRESS TEST  11-26-2012   dr Boneta Lucks  @ [facility] ([place])   normal nuclear study w/ no ischemia/  normal LV function and wall motion , ef 60% (find in care everywhere,epic)   CARDIOVERSION N/A 08/31/2020   Procedure: CARDIOVERSION;  Surgeon: Thurmon Fair, MD;  Location: [facility] ENDOSCOPY;  Service: Cardiovascular;  Laterality: N/A;   CATARACT EXTRACTION W/ INTRAOCULAR LENS  IMPLANT, BILATERAL  2015   CYSTOSCOPY WITH LITHOLAPAXY N/A 12/25/2017   Procedure: CYSTOSCOPY WITH LITHOLAPAXY AND FULGERATION;  Surgeon: Bjorn Pippin, MD;   Location[facility];  Service: Urology;  Laterality: N/A;   INGUINAL HERNIA REPAIR Right 1980s   LOOP RECORDER INSERTION N/A 04/10/2018   Procedure: LOOP RECORDER INSERTION;  Surgeon: Thurmon Fair, MD;  Location: [facility] INVASIVE CV LAB;  Service: Cardiovascular;  Laterality: N/A;   PENILE PROSTHESIS IMPLANT  12-02-2015   dr Terie Purser @ [facility] in [place]   COLOPLAST   SHOULDER SURGERY Left 08/2016   TEE WITHOUT CARDIOVERSION N/A 08/31/2020   Procedure: TRANSESOPHAGEAL ECHOCARDIOGRAM (TEE);  Surgeon: Thurmon Fair, MD;  Location: [facility] ENDOSCOPY;  Service: Cardiovascular;  Laterality: N/A;   TONSILLECTOMY  child   TRANSURETHRAL RESECTION OF PROSTATE  2009   AND REPAIR RECURRENT RIGHT INGUINAL HERNIA    SOCIAL HISTORY: Social History   Socioeconomic History   Marital status: Widowed    Spouse name: engaged   Number of children: 8   Years of education: college-2   Highest education level: Not on file  Occupational History   Occupation: Sales   Occupation: Real Engineer, drilling  Tobacco Use   Smoking status: Former    Years: 20.00    Types: Cigarettes    Quit date: 05/04/1996    Years since quitting: 25.0  Smokeless tobacco: Never  Vaping Use   Vaping Use: Never used  Substance and Sexual Activity   Alcohol use: Yes    Comment: 1-2 glasses of wine every other day   Drug use: No   Sexual activity: Never  Other Topics Concern   Not on file  Social History Narrative   Not on file   Social Determinants of Health   Financial Resource Strain: Not on file  Food Insecurity: Not on file  Transportation Needs: Not on file  Physical Activity: Not on file  Stress: Not on file  Social Connections: Not on file  Intimate Partner Violence: Not on file    FAMILY HISTORY: Family History  Problem Relation Age of Onset   Diabetes Mother    Heart failure Mother     ALLERGIES:  is allergic to bee venom, metformin and related, sulfamethoxazole-trimethoprim, bee  pollen, and metformin hcl.  MEDICATIONS:  Current Outpatient Medications  Medication Sig Dispense Refill   cyclophosphamide (CYTOXAN) 50 MG capsule Take 1 capsule (50 mg total) by mouth daily. Take with food to minimize GI upset. Take early in the day and maintain hydration. 30 capsule 1   acetaminophen (TYLENOL) 325 MG tablet Take 650 mg by mouth 2 (two) times daily as needed for fever.     amiodarone (PACERONE) 200 MG tablet Take 1 tablet (200 mg total) by mouth daily. 90 tablet 3   aspirin 325 MG EC tablet Take 325 mg by mouth 3 (three) times a week.     B Complex Vitamins (B COMPLEX 100 PO) Take 1 tablet by mouth daily.     BD PEN NEEDLE NANO 2ND GEN 32G X 4 MM MISC      budesonide (ENTOCORT EC) 3 MG 24 hr capsule Take 9 mg by mouth in the morning.     Continuous Blood Gluc Sensor (FREESTYLE LIBRE 2 SENSOR) MISC Inject 1 patch into the skin every 14 (fourteen) days.     Cranberry 400 MG TABS Take 400 mg by mouth 2 (two) times daily.     Cyanocobalamin (VITAMIN B-12) 5000 MCG SUBL Place 5,000 mcg under the tongue daily.     D-Mannose 500 MG CAPS Take 500 mg by mouth in the morning and at bedtime.     doxycycline (VIBRAMYCIN) 100 MG capsule Take 1 capsule (100 mg total) by mouth at bedtime. 30 capsule 5   folic acid (FOLVITE) 1 MG tablet TAKE 1 TABLET BY MOUTH EVERY DAY (Patient taking differently: Take 1 mg by mouth daily with breakfast.) 90 tablet 3   furosemide (LASIX) 20 MG tablet Take 20 mg for a weight 159 or below. Take 40 mg (2 tablets) for a weight over 160 lbs. (Patient taking differently: Take 20 mg by mouth in the morning.) 45 tablet 3   metoprolol tartrate (LOPRESSOR) 50 MG tablet Take 50 mg by mouth in the morning and at bedtime.     Misc Natural Products (PROSTATE HEALTH) CAPS Take 1 capsule by mouth daily with breakfast.     NOVOLOG FLEXPEN 100 UNIT/ML FlexPen Inject 1-15 Units into the skin 3 (three) times daily with meals. Glucose 121 - 150: 2 units, Glucose 151 - 200: 3  units, Glucose 201 - 250: 5 units, Glucose 251 - 300: 8 units, Glucose 301 - 350: 11 units, Glucose 351 - 400: 15 units, Glucose > 400 call MD 15 mL 11   omeprazole (PRILOSEC) 40 MG capsule Take 40 mg by mouth at bedtime.  oxybutynin (DITROPAN) 5 MG tablet Take 5 mg by mouth in the morning.     pioglitazone (ACTOS) 15 MG tablet Take 15 mg by mouth every morning.     SOLIQUA 100-33 UNT-MCG/ML SOPN Inject 23 Units into the skin at bedtime.     No current facility-administered medications for this visit.    REVIEW OF SYSTEMS:   .10 Point review of Systems was done is negative except as noted above.   PHYSICAL EXAMINATION: ECOG PERFORMANCE STATUS: 1 - Symptomatic but completely ambulatory  NAD . GENERAL:alert, in no acute distress and comfortable SKIN: no acute rashes, no significant lesions EYES: conjunctiva are pink and non-injected, sclera anicteric OROPHARYNX: MMM, no exudates, no oropharyngeal erythema or ulceration NECK: supple, no JVD LYMPH:  no palpable lymphadenopathy in the cervical, axillary or inguinal regions LUNGS: clear to auscultation b/l with normal respiratory effort HEART: regular rate & rhythm ABDOMEN:  normoactive bowel sounds , non tender, not distended. Extremity: no pedal edema PSYCH: alert & oriented x 3 with fluent speech NEURO: no focal motor/sensory deficits    LABORATORY DATA:  I have reviewed the data as listed  CBC Latest Ref Rng & Units 04/20/2021 04/07/2021 04/06/2021  WBC 4.0 - 10.5 K/uL 13.2(H) 12.3(H) 11.2(H)  Hemoglobin 13.0 - 17.0 g/dL 8.2(L) 7.9(L) 8.9(L)  Hematocrit 39.0 - 52.0 % 25.2(L) 23.7(L) 26.3(L)  Platelets 150 - 400 K/uL 356 248 261    CMP Latest Ref Rng & Units 04/20/2021 04/07/2021 04/05/2021  Glucose 70 - 99 mg/dL 345(H) 253(H) 377(H)  BUN 8 - 23 mg/dL $Remove'13 14 14  'NjnHJjV$ Creatinine 0.61 - 1.24 mg/dL 0.70 0.43(L) 0.56(L)  Sodium 135 - 145 mmol/L 137 138 137  Potassium 3.5 - 5.1 mmol/L 4.2 4.3 4.3  Chloride 98 - 111 mmol/L 104 106 105   CO2 22 - 32 mmol/L $RemoveB'22 26 24  'lkLqRlGB$ Calcium 8.9 - 10.3 mg/dL 8.9 8.9 8.8(L)  Total Protein 6.5 - 8.1 g/dL 5.8(L) - -  Total Bilirubin 0.3 - 1.2 mg/dL 0.7 - -  Alkaline Phos 38 - 126 U/L 79 - -  AST 15 - 41 U/L 19 - -  ALT 0 - 44 U/L 60(H) - -   Surgical Pathology  CASE: [id number]  PATIENT: Tommy Peterson  Bone Marrow Report      Clinical History: T cell large granular lymphocytic leukemia , Iliac  bone marrow aspirate and core biopsy , ([facility])      DIAGNOSIS:   BONE MARROW, ASPIRATE, CLOT, CORE:  -Hypercellular bone marrow with marked erythroid hypoplasia  -Granulocytic and megakaryocytic hyperplasia  -Several lymphoid aggregates present  -See comment   PERIPHERAL BLOOD:  -Normocytic-normochromic anemia  -Slight leukocytosis   COMMENT:   In the presence of previously known T-cell large granular lymphocytic  leukemia, the findings in the peripheral blood with relative abundance  of large granular lymphocytes in addition to relative abundance of CD8  positive T-cells in the bone marrow as particularly seen by flow  cytometry are concerning for persistent/residual disease process  although the changes are not considered entirely specific.  Nonetheless,  correlation with cytogenetic and molecular studies is recommended.  The  background shows profound erythroid hypoplasia possibly related to the  known lymphoproliferative process, thymoma, drug therapy, infection,  etc.  There is also generally nonspecific granulocytic and  megakaryocytic hyperplasia.  There is no definitive or diagnostic  evidence of a plasma cell neoplasm.  Clinical correlation is strongly  recommended    09/28/2016 Flow Cytometry:   RADIOGRAPHIC STUDIES: I  have personally reviewed the radiological images as listed and agreed with the findings in the report. CT BIOPSY  Result Date: 04/06/2021 INDICATION: 80 year old male with a history of chronic large granular lymphocytic leukemia with persistent  anemia and developing lymphocytosis concerning for possible transformation into a more aggressive process. He presents for CT-guided bone marrow biopsy. EXAM: CT GUIDED BONE MARROW ASPIRATION AND CORE BIOPSY Interventional Radiologist:  Criselda Peaches, MD MEDICATIONS: None. ANESTHESIA/SEDATION: Moderate (conscious) sedation was employed during this procedure. A total of 1 milligrams versed and 50 micrograms fentanyl were administered intravenously. The patient's level of consciousness and vital signs were monitored continuously by radiology nursing throughout the procedure under my direct supervision. Total monitored sedation time: 10 minutes FLUOROSCOPY TIME:  None. COMPLICATIONS: None immediate. Estimated blood loss: <25 mL PROCEDURE: Informed written consent was obtained from the patient after a thorough discussion of the procedural risks, benefits and alternatives. All questions were addressed. Maximal Sterile Barrier Technique was utilized including caps, mask, sterile gowns, sterile gloves, sterile drape, hand hygiene and skin antiseptic. A timeout was performed prior to the initiation of the procedure. The patient was positioned prone and non-contrast localization CT was performed of the pelvis to demonstrate the iliac marrow spaces. Maximal barrier sterile technique utilized including caps, mask, sterile gowns, sterile gloves, large sterile drape, hand hygiene, and betadine prep. Under sterile conditions and local anesthesia, an 11 gauge coaxial bone biopsy needle was advanced into the right iliac marrow space. Needle position was confirmed with CT imaging. Initially, bone marrow aspiration was performed. Next, the 11 gauge outer cannula was utilized to obtain a right iliac bone marrow core biopsy. Needle was removed. Hemostasis was obtained with compression. The patient tolerated the procedure well. Samples were prepared with the cytotechnologist. IMPRESSION: Successful right iliac bone marrow  aspiration and core biopsy. Electronically Signed   By: Jacqulynn Cadet M.D.   On: 04/06/2021 11:01   DG Chest Port 1 View  Result Date: 04/03/2021 CLINICAL DATA:  Weakness.  Fever.  History of leukemia.  Ex-smoker. EXAM: PORTABLE CHEST 1 VIEW COMPARISON:  Chest x-ray 03/22/2021, CT chest 07/29/2018 FINDINGS: Wireless cardiac device overlies the left chest. The heart and mediastinal contours are unchanged. Aortic calcification. No focal consolidation. Coarsened interstitial markings pulmonary edema. No pleural effusion. No pneumothorax. No acute osseous abnormality. IMPRESSION: 1. No active disease. 2. Aortic Atherosclerosis (ICD10-I70.0) and Emphysema (ICD10-J43.9). Electronically Signed   By: Iven Finn M.D.   On: 04/03/2021 20:22   CT BONE MARROW BIOPSY & ASPIRATION  Result Date: 04/06/2021 INDICATION: 80 year old male with a history of chronic large granular lymphocytic leukemia with persistent anemia and developing lymphocytosis concerning for possible transformation into a more aggressive process. He presents for CT-guided bone marrow biopsy. EXAM: CT GUIDED BONE MARROW ASPIRATION AND CORE BIOPSY Interventional Radiologist:  Criselda Peaches, MD MEDICATIONS: None. ANESTHESIA/SEDATION: Moderate (conscious) sedation was employed during this procedure. A total of 1 milligrams versed and 50 micrograms fentanyl were administered intravenously. The patient's level of consciousness and vital signs were monitored continuously by radiology nursing throughout the procedure under my direct supervision. Total monitored sedation time: 10 minutes FLUOROSCOPY TIME:  None. COMPLICATIONS: None immediate. Estimated blood loss: <25 mL PROCEDURE: Informed written consent was obtained from the patient after a thorough discussion of the procedural risks, benefits and alternatives. All questions were addressed. Maximal Sterile Barrier Technique was utilized including caps, mask, sterile gowns, sterile gloves,  sterile drape, hand hygiene and skin antiseptic. A timeout was performed prior to the  initiation of the procedure. The patient was positioned prone and non-contrast localization CT was performed of the pelvis to demonstrate the iliac marrow spaces. Maximal barrier sterile technique utilized including caps, mask, sterile gowns, sterile gloves, large sterile drape, hand hygiene, and betadine prep. Under sterile conditions and local anesthesia, an 11 gauge coaxial bone biopsy needle was advanced into the right iliac marrow space. Needle position was confirmed with CT imaging. Initially, bone marrow aspiration was performed. Next, the 11 gauge outer cannula was utilized to obtain a right iliac bone marrow core biopsy. Needle was removed. Hemostasis was obtained with compression. The patient tolerated the procedure well. Samples were prepared with the cytotechnologist. IMPRESSION: Successful right iliac bone marrow aspiration and core biopsy. Electronically Signed   By: Jacqulynn Cadet M.D.   On: 04/06/2021 11:01     ASSESSMENT & PLAN:   80 yo with   1) T cell large granular leukemia s/p transfusion dependent anemia - previous blood transfusion dependent and was last on MTX $Remo'30mg'eNkFK$  po weekly a few months ago 2) IgG Lambda MGUS 3) recent COVID-19 infection  PLAN: -Discussed pt's bone marrow biopsy from 04/06/2021 which shows persistent T-LGL. -His T-LGL is now resistant to methotrexate and is causing transfusion dependent anemia which would be an indication for treatment. -Discussed second line treatment with oral Cytoxan.  After discussing risk benefits and alternatives patient is agreeable to start this.  We shall start Cytoxan at 50 mg p.o. daily and increase to 100 mg p.o. daily if tolerated. -He will need weekly lab monitoring and transfusions as needed. -Depending on response might need to consider third line Jakafi down the line. --Continue 2 mg Folic acid, Vitamin U93, and B-complex daily. - 4)  RLE swelling [place] venosu ordered and done - reviewed - neg for DVT. Continue f/u with PCP  FOLLOW UP:  Please schedule for weekly labs with 1 unit of PRBC transfusion every week x6 MD visit in 3 weeks for toxicity check with newly started Cytoxan  . The total time spent in the appointment was 32 minutes and more than 50% was on counseling and direct patient cares.  All of the patient's questions were answered with apparent satisfaction. The patient knows to call the clinic with any problems, questions or concerns.   Sullivan Lone MD [place] AAHIVMS [facility] Hematology/Oncology Physician [facility]

## 2021-05-04 ENCOUNTER — Inpatient Hospital Stay: Payer: Medicare HMO | Admitting: Hematology

## 2021-05-04 ENCOUNTER — Inpatient Hospital Stay: Payer: Medicare HMO

## 2021-05-04 ENCOUNTER — Other Ambulatory Visit: Payer: Self-pay

## 2021-05-04 ENCOUNTER — Telehealth: Payer: Self-pay

## 2021-05-04 DIAGNOSIS — B961 Klebsiella pneumoniae [K. pneumoniae] as the cause of diseases classified elsewhere: Secondary | ICD-10-CM | POA: Diagnosis present

## 2021-05-04 DIAGNOSIS — R410 Disorientation, unspecified: Secondary | ICD-10-CM | POA: Diagnosis not present

## 2021-05-04 DIAGNOSIS — Z1611 Resistance to penicillins: Secondary | ICD-10-CM | POA: Diagnosis present

## 2021-05-04 DIAGNOSIS — C91Z0 Other lymphoid leukemia not having achieved remission: Secondary | ICD-10-CM

## 2021-05-04 DIAGNOSIS — G9341 Metabolic encephalopathy: Secondary | ICD-10-CM | POA: Diagnosis not present

## 2021-05-04 DIAGNOSIS — R627 Adult failure to thrive: Secondary | ICD-10-CM | POA: Diagnosis present

## 2021-05-04 DIAGNOSIS — Z20822 Contact with and (suspected) exposure to covid-19: Secondary | ICD-10-CM | POA: Diagnosis present

## 2021-05-04 DIAGNOSIS — R519 Headache, unspecified: Secondary | ICD-10-CM | POA: Diagnosis present

## 2021-05-04 DIAGNOSIS — Z888 Allergy status to other drugs, medicaments and biological substances status: Secondary | ICD-10-CM

## 2021-05-04 DIAGNOSIS — R400 Somnolence: Secondary | ICD-10-CM | POA: Diagnosis not present

## 2021-05-04 DIAGNOSIS — Z882 Allergy status to sulfonamides status: Secondary | ICD-10-CM

## 2021-05-04 DIAGNOSIS — K529 Noninfective gastroenteritis and colitis, unspecified: Secondary | ICD-10-CM | POA: Diagnosis present

## 2021-05-04 DIAGNOSIS — Z7982 Long term (current) use of aspirin: Secondary | ICD-10-CM

## 2021-05-04 DIAGNOSIS — E1165 Type 2 diabetes mellitus with hyperglycemia: Secondary | ICD-10-CM | POA: Diagnosis present

## 2021-05-04 DIAGNOSIS — I10 Essential (primary) hypertension: Secondary | ICD-10-CM | POA: Diagnosis present

## 2021-05-04 DIAGNOSIS — D539 Nutritional anemia, unspecified: Secondary | ICD-10-CM | POA: Diagnosis present

## 2021-05-04 DIAGNOSIS — Z794 Long term (current) use of insulin: Secondary | ICD-10-CM

## 2021-05-04 DIAGNOSIS — Z79899 Other long term (current) drug therapy: Secondary | ICD-10-CM

## 2021-05-04 DIAGNOSIS — I4819 Other persistent atrial fibrillation: Secondary | ICD-10-CM | POA: Diagnosis present

## 2021-05-04 DIAGNOSIS — Z8249 Family history of ischemic heart disease and other diseases of the circulatory system: Secondary | ICD-10-CM

## 2021-05-04 DIAGNOSIS — N400 Enlarged prostate without lower urinary tract symptoms: Secondary | ICD-10-CM | POA: Diagnosis present

## 2021-05-04 DIAGNOSIS — K0889 Other specified disorders of teeth and supporting structures: Secondary | ICD-10-CM | POA: Diagnosis present

## 2021-05-04 DIAGNOSIS — R32 Unspecified urinary incontinence: Secondary | ICD-10-CM | POA: Diagnosis present

## 2021-05-04 DIAGNOSIS — D849 Immunodeficiency, unspecified: Secondary | ICD-10-CM | POA: Diagnosis present

## 2021-05-04 DIAGNOSIS — Z85828 Personal history of other malignant neoplasm of skin: Secondary | ICD-10-CM

## 2021-05-04 DIAGNOSIS — M549 Dorsalgia, unspecified: Secondary | ICD-10-CM | POA: Diagnosis present

## 2021-05-04 DIAGNOSIS — K52831 Collagenous colitis: Secondary | ICD-10-CM | POA: Diagnosis present

## 2021-05-04 DIAGNOSIS — W19XXXA Unspecified fall, initial encounter: Secondary | ICD-10-CM | POA: Diagnosis present

## 2021-05-04 DIAGNOSIS — E876 Hypokalemia: Secondary | ICD-10-CM | POA: Diagnosis present

## 2021-05-04 DIAGNOSIS — Z833 Family history of diabetes mellitus: Secondary | ICD-10-CM

## 2021-05-04 DIAGNOSIS — F05 Delirium due to known physiological condition: Secondary | ICD-10-CM | POA: Diagnosis present

## 2021-05-04 DIAGNOSIS — Z6822 Body mass index (BMI) 22.0-22.9, adult: Secondary | ICD-10-CM

## 2021-05-04 DIAGNOSIS — N390 Urinary tract infection, site not specified: Secondary | ICD-10-CM | POA: Diagnosis present

## 2021-05-04 LAB — CBC WITH DIFFERENTIAL (CANCER CENTER ONLY)
Abs Immature Granulocytes: 0.38 10*3/uL — ABNORMAL HIGH (ref 0.00–0.07)
Basophils Absolute: 0 10*3/uL (ref 0.0–0.1)
Basophils Relative: 0 %
Eosinophils Absolute: 0 10*3/uL (ref 0.0–0.5)
Eosinophils Relative: 0 %
HCT: 18.7 % — ABNORMAL LOW (ref 39.0–52.0)
Hemoglobin: 6.2 g/dL — CL (ref 13.0–17.0)
Immature Granulocytes: 3 %
Lymphocytes Relative: 36 %
Lymphs Abs: 4.6 10*3/uL — ABNORMAL HIGH (ref 0.7–4.0)
MCH: 29.2 pg (ref 26.0–34.0)
MCHC: 33.2 g/dL (ref 30.0–36.0)
MCV: 88.2 fL (ref 80.0–100.0)
Monocytes Absolute: 1.3 10*3/uL — ABNORMAL HIGH (ref 0.1–1.0)
Monocytes Relative: 10 %
Neutro Abs: 6.5 10*3/uL (ref 1.7–7.7)
Neutrophils Relative %: 51 %
Platelet Count: 240 10*3/uL (ref 150–400)
RBC: 2.12 MIL/uL — ABNORMAL LOW (ref 4.22–5.81)
RDW: 14.9 % (ref 11.5–15.5)
WBC Count: 12.9 10*3/uL — ABNORMAL HIGH (ref 4.0–10.5)
nRBC: 0 % (ref 0.0–0.2)

## 2021-05-04 LAB — PREPARE RBC (CROSSMATCH)

## 2021-05-04 LAB — CMP (CANCER CENTER ONLY)
ALT: 45 U/L — ABNORMAL HIGH (ref 0–44)
AST: 14 U/L — ABNORMAL LOW (ref 15–41)
Albumin: 3 g/dL — ABNORMAL LOW (ref 3.5–5.0)
Alkaline Phosphatase: 87 U/L (ref 38–126)
Anion gap: 9 (ref 5–15)
BUN: 19 mg/dL (ref 8–23)
CO2: 25 mmol/L (ref 22–32)
Calcium: 9 mg/dL (ref 8.9–10.3)
Chloride: 102 mmol/L (ref 98–111)
Creatinine: 0.7 mg/dL (ref 0.61–1.24)
GFR, Estimated: >60 mL/min (ref >60)
Glucose, Bld: 294 mg/dL — ABNORMAL HIGH (ref 70–99)
Potassium: 4 mmol/L (ref 3.5–5.1)
Sodium: 136 mmol/L (ref 135–145)
Total Bilirubin: 1 mg/dL (ref 0.3–1.2)
Total Protein: 5.9 g/dL — ABNORMAL LOW (ref 6.5–8.1)

## 2021-05-04 LAB — SAMPLE TO BLOOD BANK

## 2021-05-04 MED ORDER — ACETAMINOPHEN 325 MG PO TABS
650.0000 mg | ORAL_TABLET | Freq: Once | ORAL | Status: AC
  Administered 2021-05-04: 650 mg via ORAL
  Filled 2021-05-04: qty 2

## 2021-05-04 MED ORDER — FUROSEMIDE 10 MG/ML IJ SOLN
20.0000 mg | Freq: Once | INTRAMUSCULAR | Status: AC
  Administered 2021-05-04: 20 mg via INTRAVENOUS
  Filled 2021-05-04: qty 2

## 2021-05-04 MED ORDER — METHYLPREDNISOLONE SODIUM SUCC 40 MG IJ SOLR
40.0000 mg | Freq: Once | INTRAMUSCULAR | Status: AC
  Administered 2021-05-04: 40 mg via INTRAVENOUS
  Filled 2021-05-04: qty 1

## 2021-05-04 MED ORDER — SODIUM CHLORIDE 0.9% IV SOLUTION
250.0000 mL | Freq: Once | INTRAVENOUS | Status: AC
  Administered 2021-05-04: 250 mL via INTRAVENOUS

## 2021-05-04 NOTE — Telephone Encounter (Signed)
CRITICAL VALUE STICKER  CRITICAL VALUE: Hgb = 6.2  RECEIVER (on-site recipient of call): Yetta Glassman, CMA  DATE & TIME NOTIFIED: 05/04/21 at 9:52am  MESSENGER (representative from lab): Lelan Pons  MD NOTIFIED: Irene Limbo  TIME OF NOTIFICATION: 05/04/21 at 9:55am  RESPONSE: Notification given to Ilda Foil., RN for follow-up with pt and provider.

## 2021-05-05 ENCOUNTER — Telehealth: Payer: Self-pay | Admitting: Cardiovascular Disease

## 2021-05-05 LAB — TYPE AND SCREEN
ABO/RH(D): O POS
Antibody Screen: NEGATIVE
Unit division: 0
Unit division: 0

## 2021-05-05 LAB — BPAM RBC
Blood Product Expiration Date: 202210302359
Blood Product Expiration Date: 202210302359
ISSUE DATE / TIME: 202209281113
ISSUE DATE / TIME: 202209281113
Unit Type and Rh: 5100
Unit Type and Rh: 5100

## 2021-05-05 NOTE — Telephone Encounter (Signed)
Pt c/o swelling: STAT is pt has developed SOB within 24 hours  How much weight have you gained and in what time span?  No weight gain   If swelling, where is the swelling located?  Feet and legs  Are you currently taking a fluid pill?  Patient states Dr. Irene Limbo puts him on Lasix yesterday   Are you currently SOB?  No   Do you have a log of your daily weights (if so, list)?  No log available   Have you gained 3 pounds in a day or 5 pounds in a week?  No log available  Have you traveled recently?  No

## 2021-05-05 NOTE — Telephone Encounter (Signed)
Called pt back and he states that he is swollen in his feet bilaterally up to his mid-calf. This swelling he has had  for the last 3 months, he states that "it goes down about 30%" then when he starts walking round during the day the swelling goes back up again. He states that he takes lasix 20mg  daily but has not taken yet today. He took 40mg  last night. He does have DOE and SOB when inactive. He states that his weight today and the last week has been 152#. He will take the lasix 20mg  now and will call back in the am with results. He will call back if anything else develops.

## 2021-05-06 ENCOUNTER — Other Ambulatory Visit: Payer: Self-pay

## 2021-05-06 ENCOUNTER — Inpatient Hospital Stay (HOSPITAL_COMMUNITY)
Admission: EM | Admit: 2021-05-06 | Discharge: 2021-05-10 | DRG: 071 | Disposition: A | Discharge status: Home / Self Care | Payer: No Typology Code available for payment source | Attending: Internal Medicine | Admitting: Internal Medicine

## 2021-05-06 ENCOUNTER — Encounter (HOSPITAL_COMMUNITY): Payer: Self-pay

## 2021-05-06 ENCOUNTER — Emergency Department (HOSPITAL_COMMUNITY): Payer: No Typology Code available for payment source

## 2021-05-06 DIAGNOSIS — R410 Disorientation, unspecified: Secondary | ICD-10-CM | POA: Diagnosis not present

## 2021-05-06 DIAGNOSIS — N390 Urinary tract infection, site not specified: Secondary | ICD-10-CM

## 2021-05-06 DIAGNOSIS — E1165 Type 2 diabetes mellitus with hyperglycemia: Secondary | ICD-10-CM

## 2021-05-06 DIAGNOSIS — G9340 Encephalopathy, unspecified: Secondary | ICD-10-CM

## 2021-05-06 DIAGNOSIS — E119 Type 2 diabetes mellitus without complications: Secondary | ICD-10-CM | POA: Diagnosis not present

## 2021-05-06 DIAGNOSIS — I4819 Other persistent atrial fibrillation: Secondary | ICD-10-CM

## 2021-05-06 DIAGNOSIS — R400 Somnolence: Secondary | ICD-10-CM | POA: Diagnosis not present

## 2021-05-06 DIAGNOSIS — D72829 Elevated white blood cell count, unspecified: Secondary | ICD-10-CM

## 2021-05-06 DIAGNOSIS — G9341 Metabolic encephalopathy: Secondary | ICD-10-CM | POA: Diagnosis present

## 2021-05-06 DIAGNOSIS — D649 Anemia, unspecified: Secondary | ICD-10-CM

## 2021-05-06 DIAGNOSIS — Z794 Long term (current) use of insulin: Secondary | ICD-10-CM

## 2021-05-06 DIAGNOSIS — C91Z0 Other lymphoid leukemia not having achieved remission: Secondary | ICD-10-CM

## 2021-05-06 DIAGNOSIS — I10 Essential (primary) hypertension: Secondary | ICD-10-CM

## 2021-05-06 LAB — COMPREHENSIVE METABOLIC PANEL
ALT: 52 U/L — ABNORMAL HIGH (ref 0–44)
AST: 22 U/L (ref 15–41)
Albumin: 3.6 g/dL (ref 3.5–5.0)
Alkaline Phosphatase: 94 U/L (ref 38–126)
Anion gap: 10 (ref 5–15)
BUN: 18 mg/dL (ref 8–23)
CO2: 28 mmol/L (ref 22–32)
Calcium: 9.2 mg/dL (ref 8.9–10.3)
Chloride: 96 mmol/L — ABNORMAL LOW (ref 98–111)
Creatinine, Ser: 0.63 mg/dL (ref 0.61–1.24)
GFR, Estimated: >60 mL/min (ref >60)
Glucose, Bld: 299 mg/dL — ABNORMAL HIGH (ref 70–99)
Potassium: 4 mmol/L (ref 3.5–5.1)
Sodium: 134 mmol/L — ABNORMAL LOW (ref 135–145)
Total Bilirubin: 1.2 mg/dL (ref 0.3–1.2)
Total Protein: 6.9 g/dL (ref 6.5–8.1)

## 2021-05-06 LAB — URINALYSIS, ROUTINE W REFLEX MICROSCOPIC
Bilirubin Urine: NEGATIVE
Glucose, UA: >=500 mg/dL — AB
Ketones, ur: 5 mg/dL — AB
Nitrite: NEGATIVE
Protein, ur: NEGATIVE mg/dL
Specific Gravity, Urine: 1.021 (ref 1.005–1.030)
pH: 6 (ref 5.0–8.0)

## 2021-05-06 LAB — CBC WITH DIFFERENTIAL/PLATELET
Abs Immature Granulocytes: 0.52 10*3/uL — ABNORMAL HIGH (ref 0.00–0.07)
Basophils Absolute: 0 10*3/uL (ref 0.0–0.1)
Basophils Relative: 0 %
Eosinophils Absolute: 0 10*3/uL (ref 0.0–0.5)
Eosinophils Relative: 0 %
HCT: 28 % — ABNORMAL LOW (ref 39.0–52.0)
Hemoglobin: 9.5 g/dL — ABNORMAL LOW (ref 13.0–17.0)
Immature Granulocytes: 2 %
Lymphocytes Relative: 25 %
Lymphs Abs: 7 10*3/uL — ABNORMAL HIGH (ref 0.7–4.0)
MCH: 31.4 pg (ref 26.0–34.0)
MCHC: 33.9 g/dL (ref 30.0–36.0)
MCV: 92.4 fL (ref 80.0–100.0)
Monocytes Absolute: 2.4 10*3/uL — ABNORMAL HIGH (ref 0.1–1.0)
Monocytes Relative: 9 %
Neutro Abs: 18.2 10*3/uL — ABNORMAL HIGH (ref 1.7–7.7)
Neutrophils Relative %: 64 %
Platelets: 255 10*3/uL (ref 150–400)
RBC: 3.03 MIL/uL — ABNORMAL LOW (ref 4.22–5.81)
RDW: 15 % (ref 11.5–15.5)
WBC: 28.1 10*3/uL — ABNORMAL HIGH (ref 4.0–10.5)
nRBC: 0 % (ref 0.0–0.2)

## 2021-05-06 LAB — LACTIC ACID, PLASMA: Lactic Acid, Venous: 1.6 mmol/L (ref 0.5–1.9)

## 2021-05-06 LAB — SAMPLE TO BLOOD BANK

## 2021-05-06 IMAGING — DX DG CHEST 1V PORT
1 series · 1 of 1 positions shown · non-contrast
Comparison: [DATE]

CLINICAL DATA: Altered level of consciousness, confusion

EXAM:
PORTABLE CHEST 1 VIEW

[chest ap]
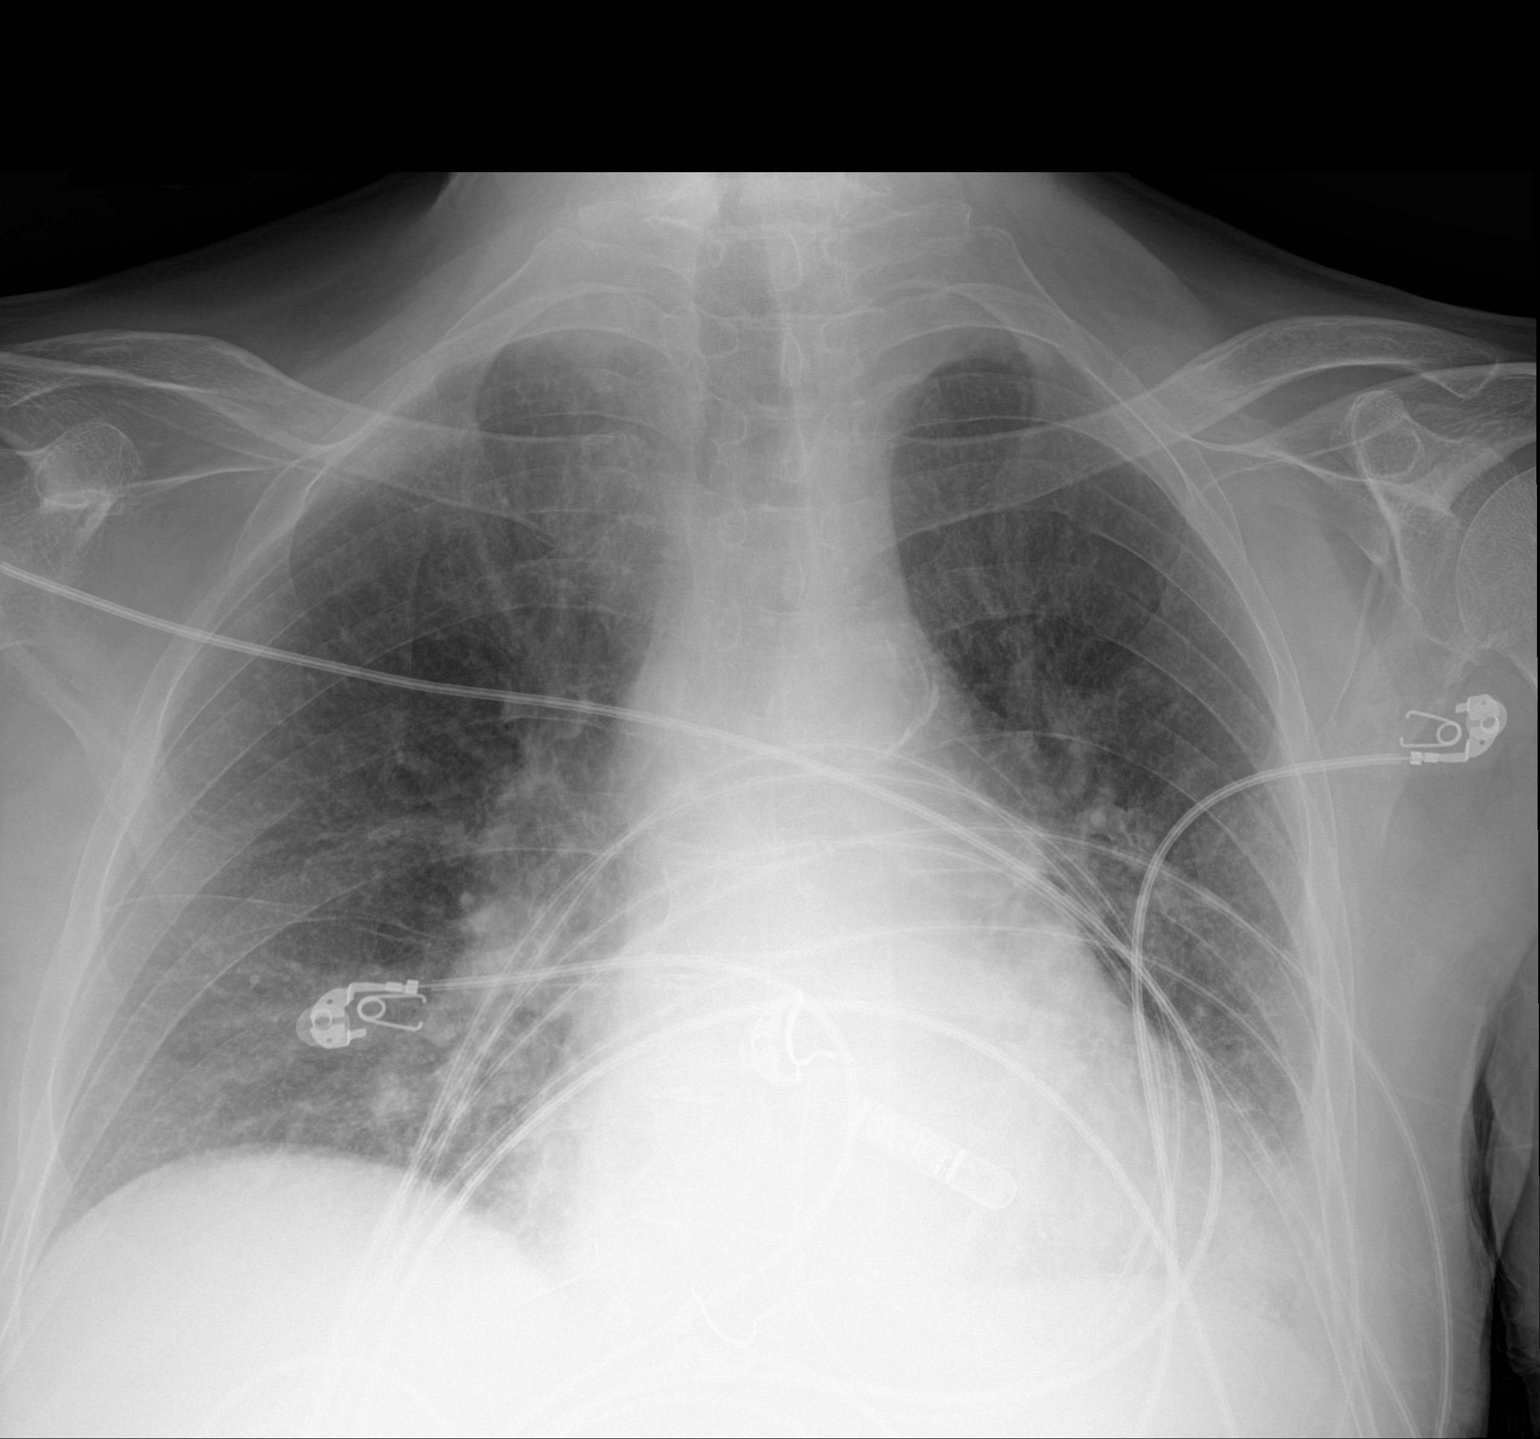

[1 of 1 positions shown; findings below may reference images not displayed]

FINDINGS: Single frontal view of the chest demonstrates a stable cardiac
silhouette. There is chronic central vascular congestion and
interstitial prominence, without focal airspace disease, effusion,
or pneumothorax. No acute bony abnormalities.
IMPRESSION: 1. Stable chest, no acute process.

## 2021-05-06 IMAGING — CT CT HEAD W/O CM
3 of 6 series · 16 of 47 positions shown, 19 images · non-contrast
Comparison: [DATE]

CLINICAL DATA: Confusion, altered level of consciousness

EXAM:
CT HEAD WITHOUT CONTRAST
TECHNIQUE: Contiguous axial images were obtained from the base of the skull
through the vertex without intravenous contrast.

[Series 3: head wo · axial · 0.47mm/px · z∈[+1237,+1387]mm · 11 of 34 slices shown, 14 images]
[im 2/34  brain]
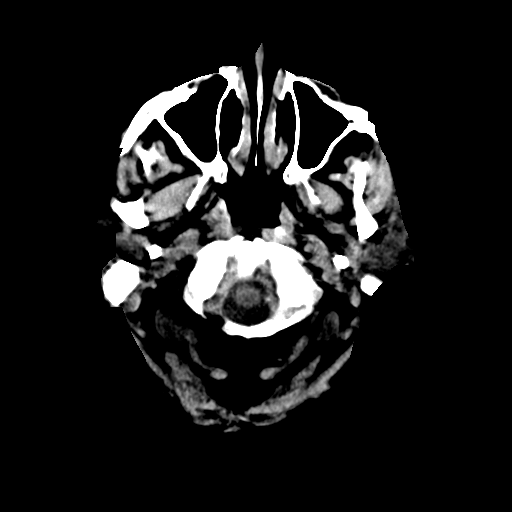
[im 2/34  bone]
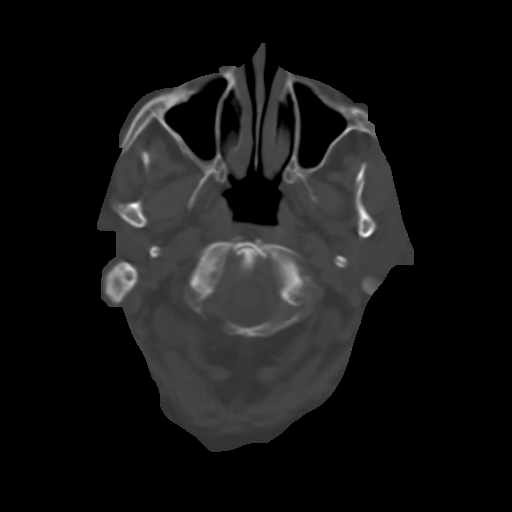
[im 6/34  brain]
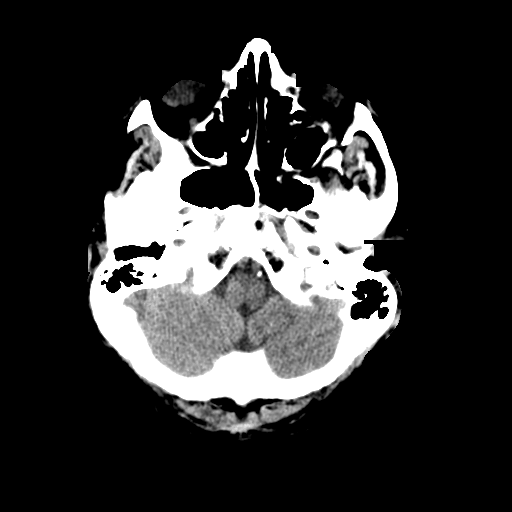
[im 8/34  brain]
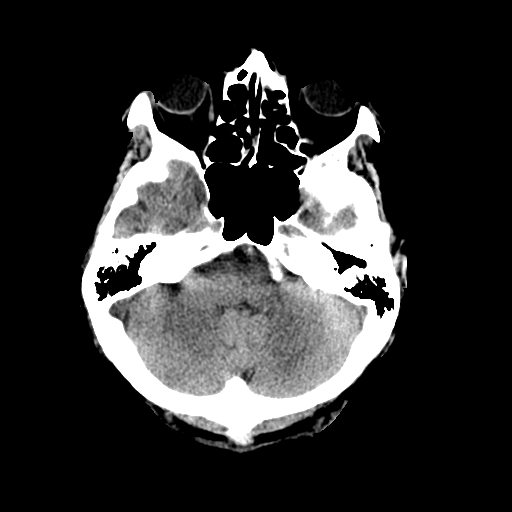
[im 12/34  brain]
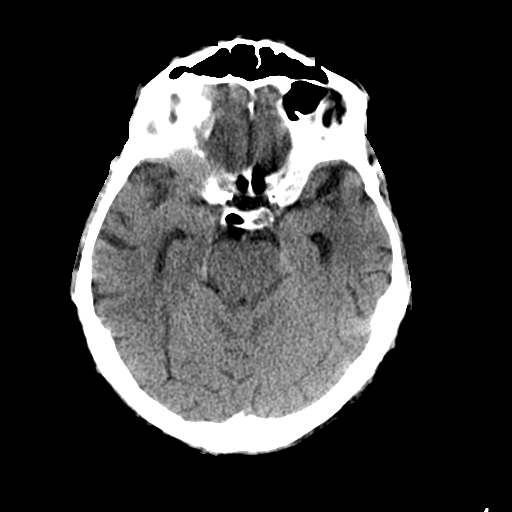
[im 13/34  brain]
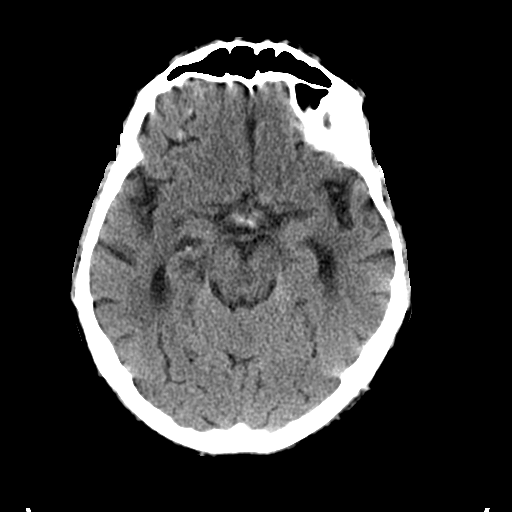
[im 13/34  bone]
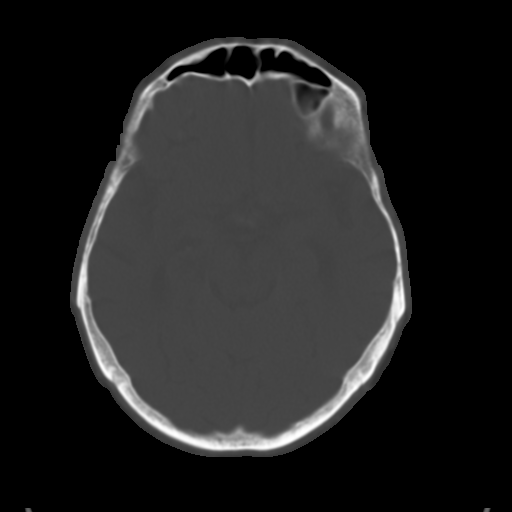
[im 17/34  brain]
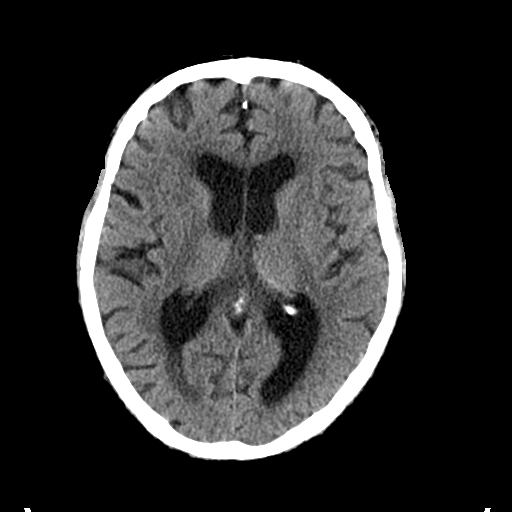
[im 21/34  brain]
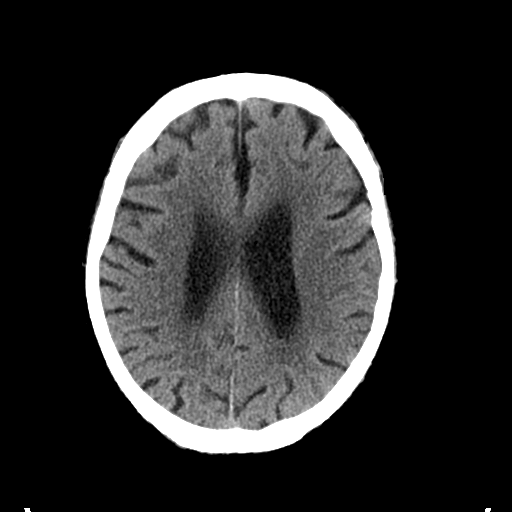
[im 23/34  brain]
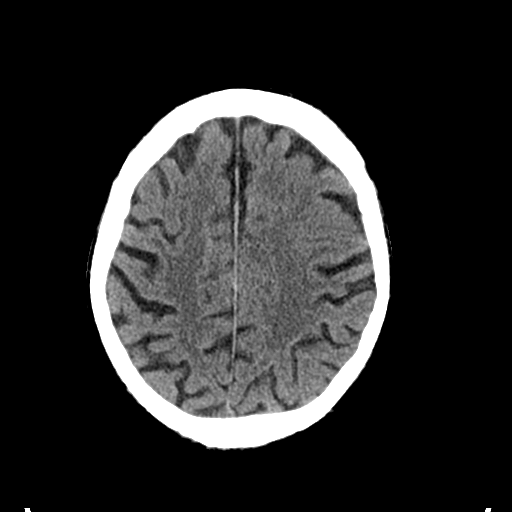
[im 26/34  brain]
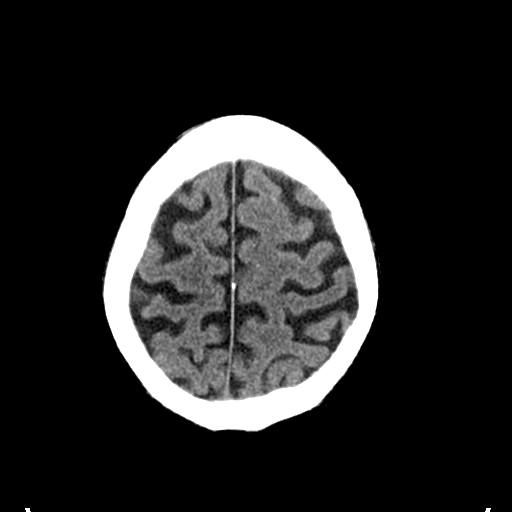
[im 26/34  bone]
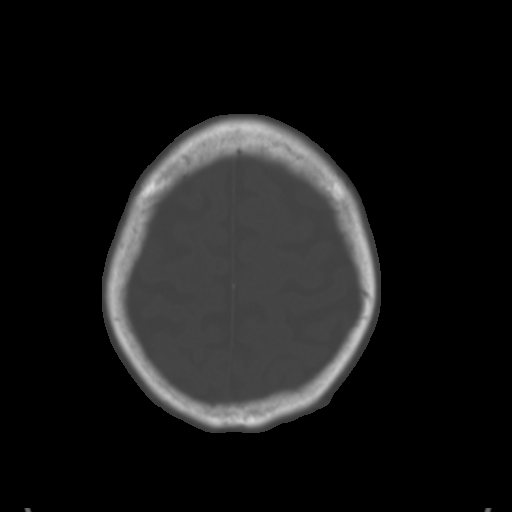
[im 28/34  brain]
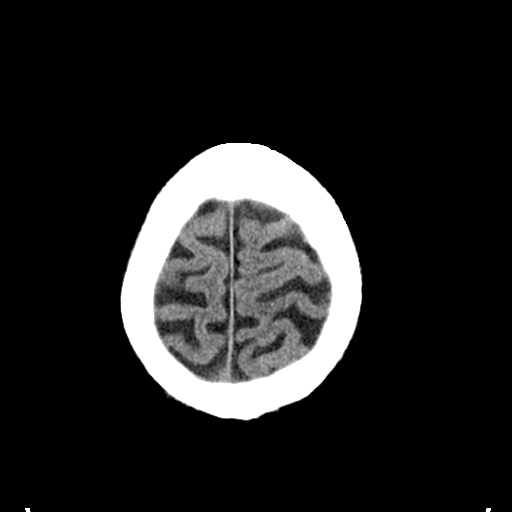
[im 32/34  brain]
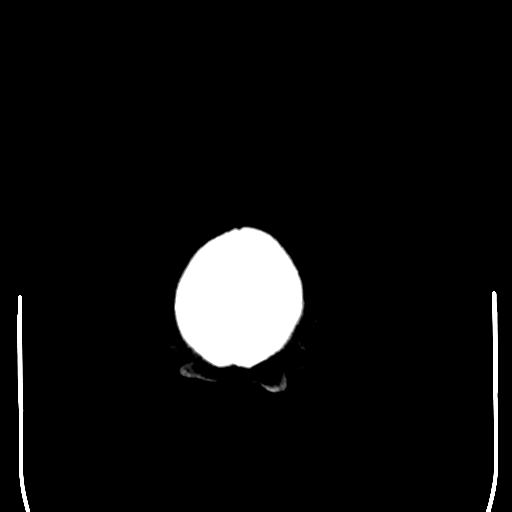

[Series 8: coronal soft tissue · coronal · 0.33mm/px · 3 of 73 slices shown]
[im 8/73  brain]
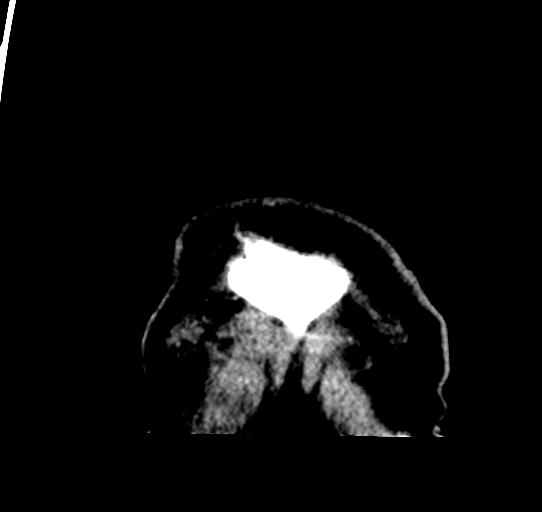
[im 27/73  brain]
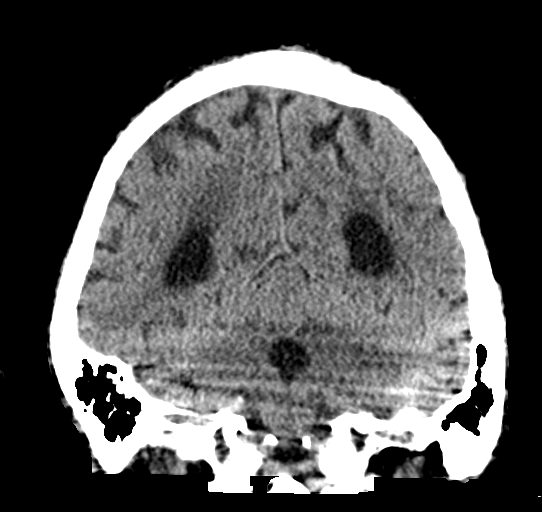
[im 46/73  brain]
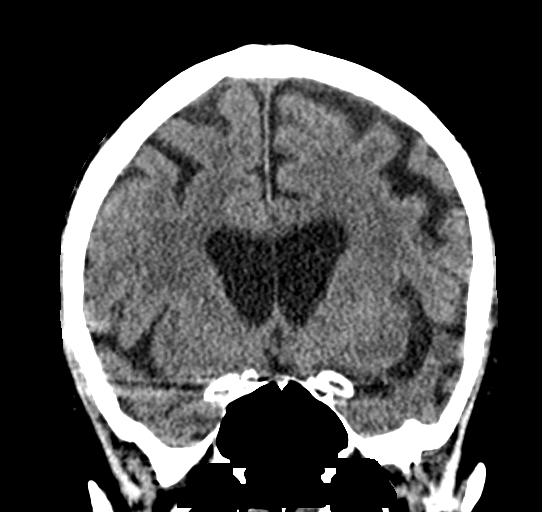

[Series 9: sagittal soft tissue · sagittal · 0.33mm/px · 2 of 61 slices shown]
[im 21/61  brain]
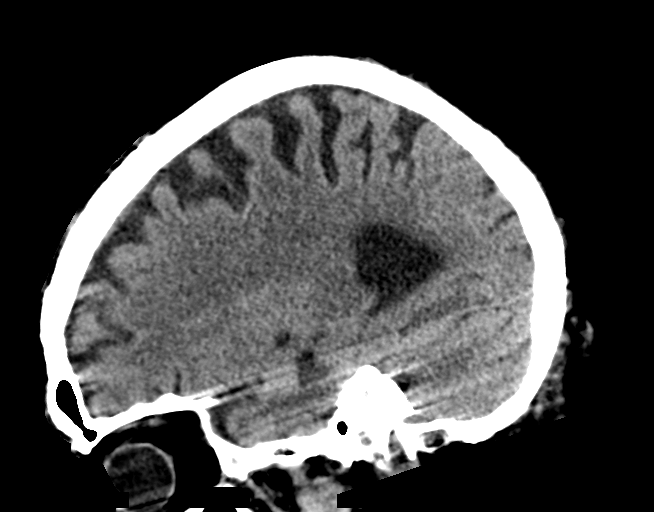
[im 41/61  brain]
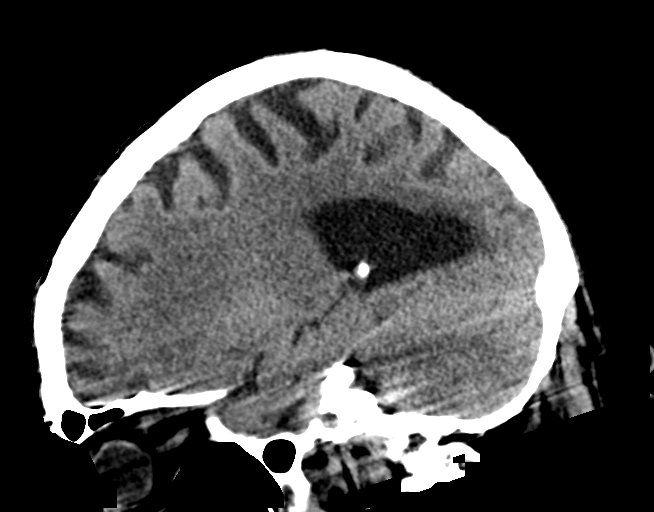

[16 of 47 positions shown; findings below may reference images not displayed]

FINDINGS: Brain: No acute infarct or hemorrhage. Lateral ventricles and
midline structures are unremarkable. No acute extra-axial fluid
collections. No mass effect.

Vascular: No hyperdense vessel or unexpected calcification.

Skull: Normal. Negative for fracture or focal lesion.

Sinuses/Orbits: No acute finding.

Other: None.
IMPRESSION: 1. Stable head CT, no acute intracranial process.

## 2021-05-06 MED ORDER — HALOPERIDOL LACTATE 5 MG/ML IJ SOLN
2.0000 mg | Freq: Four times a day (QID) | INTRAMUSCULAR | Status: DC | PRN
  Administered 2021-05-07 – 2021-05-08 (×2): 5 mg via INTRAVENOUS
  Filled 2021-05-06 (×3): qty 1

## 2021-05-06 MED ORDER — ONDANSETRON HCL 4 MG PO TABS: 4.0000 mg | ORAL_TABLET | Freq: Four times a day (QID) | ORAL | Status: DC | PRN

## 2021-05-06 MED ORDER — SODIUM CHLORIDE 0.9 % IV SOLN
1.0000 g | Freq: Once | INTRAVENOUS | Status: AC
  Administered 2021-05-06: 1 g via INTRAVENOUS
  Filled 2021-05-06: qty 10

## 2021-05-06 MED ORDER — LORAZEPAM 2 MG/ML IJ SOLN
1.0000 mg | Freq: Once | INTRAMUSCULAR | Status: AC
  Administered 2021-05-06: 1 mg via INTRAVENOUS
  Filled 2021-05-06: qty 1

## 2021-05-06 MED ORDER — SODIUM CHLORIDE 0.9 % IV SOLN: 1.0000 g | INTRAVENOUS | Status: DC

## 2021-05-06 MED ORDER — ACETAMINOPHEN 325 MG PO TABS: 650.0000 mg | ORAL_TABLET | Freq: Four times a day (QID) | ORAL | Status: DC | PRN

## 2021-05-06 MED ORDER — ACETAMINOPHEN 650 MG RE SUPP: 650.0000 mg | Freq: Four times a day (QID) | RECTAL | Status: DC | PRN

## 2021-05-06 MED ORDER — VANCOMYCIN HCL 1500 MG/300ML IV SOLN
1500.0000 mg | INTRAVENOUS | Status: AC
  Administered 2021-05-07: 1500 mg via INTRAVENOUS
  Filled 2021-05-06: qty 300

## 2021-05-06 MED ORDER — DEXTROSE 5 % IV SOLN
10.0000 mg/kg | Freq: Three times a day (TID) | INTRAVENOUS | Status: DC
  Administered 2021-05-07 – 2021-05-10 (×10): 710 mg via INTRAVENOUS
  Filled 2021-05-06 (×12): qty 14.2

## 2021-05-06 MED ORDER — SODIUM CHLORIDE 0.9 % IV SOLN
2.0000 g | INTRAVENOUS | Status: DC
  Administered 2021-05-07 (×4): 2 g via INTRAVENOUS
  Filled 2021-05-06 (×3): qty 2000
  Filled 2021-05-06: qty 2
  Filled 2021-05-06 (×4): qty 2000

## 2021-05-06 MED ORDER — SODIUM CHLORIDE 0.9 % IV SOLN
2.0000 g | Freq: Two times a day (BID) | INTRAVENOUS | Status: DC
  Administered 2021-05-07: 2 g via INTRAVENOUS
  Filled 2021-05-06 (×2): qty 20

## 2021-05-06 MED ORDER — ENOXAPARIN SODIUM 40 MG/0.4ML IJ SOSY: 40.0000 mg | PREFILLED_SYRINGE | INTRAMUSCULAR | Status: DC

## 2021-05-06 MED ORDER — ONDANSETRON HCL 4 MG/2ML IJ SOLN: 4.0000 mg | Freq: Four times a day (QID) | INTRAMUSCULAR | Status: DC | PRN

## 2021-05-06 MED ORDER — VANCOMYCIN HCL IN DEXTROSE 1-5 GM/200ML-% IV SOLN
1000.0000 mg | Freq: Two times a day (BID) | INTRAVENOUS | Status: DC
  Administered 2021-05-07: 1000 mg via INTRAVENOUS
  Filled 2021-05-06: qty 200

## 2021-05-06 MED ORDER — INSULIN ASPART 100 UNIT/ML IJ SOLN
0.0000 [IU] | INTRAMUSCULAR | Status: DC
  Administered 2021-05-07 (×2): 2 [IU] via SUBCUTANEOUS
  Administered 2021-05-07: 3 [IU] via SUBCUTANEOUS
  Administered 2021-05-07: 2 [IU] via SUBCUTANEOUS
  Administered 2021-05-07: 3 [IU] via SUBCUTANEOUS
  Administered 2021-05-08: 1 [IU] via SUBCUTANEOUS
  Administered 2021-05-08: 5 [IU] via SUBCUTANEOUS
  Administered 2021-05-08: 1 [IU] via SUBCUTANEOUS
  Administered 2021-05-08: 5 [IU] via SUBCUTANEOUS
  Administered 2021-05-08: 2 [IU] via SUBCUTANEOUS
  Filled 2021-05-06: qty 0.09

## 2021-05-06 MED ORDER — CEFTRIAXONE SODIUM 1 G IJ SOLR
1.0000 g | Freq: Once | INTRAMUSCULAR | Status: AC
Start: 2021-05-06 — End: 2021-05-07
  Administered 2021-05-07: 1 g via INTRAVENOUS
  Filled 2021-05-06: qty 10

## 2021-05-06 MED ORDER — LACTATED RINGERS IV SOLN: INTRAVENOUS | Status: DC

## 2021-05-06 NOTE — H&P (Signed)
History and Physical    Tommy Peterson NOT:[id number] DOB: Sep 12, 1940 DOA: 05/06/2021  PCP: [name], DO  Patient coming from: Home  I have personally briefly reviewed patient's old medical records in [place]  Chief Complaint: AMS  HPI: Tommy Peterson is a 80 y.o. male with medical history significant of LGL leukemia, A.Fib not on AC, prior GIB, chronic anemia gets frequent transfusions (due to leukemia).  Pt had transfusion for leukemia earlier this week.  Pt recently started on cytoxan for leukemia.  Pt presents to ED with 1 day h/o confusion.  Worsening, now severe with agitation in ED.  Pt does have back pain and headache.  Also has dysuria.  Family reported they suspected UTI, and also has abscess in mouth.  Unable to get much more history out of patient due to AMS.   ED Course: UA with small LE, 21-50 WBC.  CT head neg, CXR neg.  Due to agitation: EDP unable to get LP.   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--  treatment weekly methotraxate and transfusion's (last PRBCs 07-19-2017)   Collagenous colitis    chronic--- intermittant between diarrahea and constipation   ED (erectile dysfunction)    HCAP (healthcare-associated pneumonia)    History of adenomatous polyp of colon    History of Clostridium difficile colitis 05/2016   History of pneumonia  11/08/2017   CAP -- LLL---  12-19-2017  per pt no residual symptoms   History of SCC (squamous cell carcinoma) of skin    History of sepsis    11-20-2017 severe sepsis due to UTI;  10/ 2017sepsis due to c-diff colitis   Leucocytosis    chronic   Lower urinary tract symptoms (LUTS)    Macrocytic anemia    since 02/ 2015   MGUS (monoclonal gammopathy of unknown significance)    hemotologist-  [name] @ [facility]   Monoclonal paraproteinemia    Neuropathy, peripheral    Nodular basal cell carcinoma (BCC) 10/01/2018   Left Neck(Nodular) Pt Does Not Want Treatment   Nodular basal cell carcinoma (BCC) 10/01/2018   Mid Forehead (treament curet and excision)   OA (osteoarthritis)    Prostatic stone    Rash of face    RIGHT SIDE   Raynaud's syndrome    Recurrent BCC (basal cell carcinoma) 03/05/2019   positive margin   SCC (squamous cell carcinoma) 08/10/2009   Central Forehead (Moh's Dr. Rhona Raider @ [facility])   SCC (squamous cell carcinoma) 12/08/2011   CIS-Left Cheek (treatment Aldara @ [facility])   SCC (squamous cell carcinoma) 07/09/2015   CIS-Left Forehead (treament @ [facility])   Septic shock ([place])    x 3 w/UTI   Squamous cell carcinoma in situ (SCCIS) 12/08/2011   Left Cheek (treatment Aldara @ [facility])    Superficial basal cell carcinoma ([place]) 10/01/2018   Left  Shin-Pt does not want treatment   Thin skin    fragile   Transfusion-dependent anemia since 10/ 2017   last transfusion PRBCs 07-19-2017  per hemologist note dated 10-25-2017   Type 2 diabetes mellitus treated with insulin ([place])    followed by [name] (pcp)   Urinary hesitancy    Wears contact lenses    LEFT EYE ONLY    Past Surgical History:  Procedure Laterality Date   BONE MARROW BIOPSY Right 05-24-2016;  08-20-2014   CARDIOVASCULAR STRESS TEST  11-26-2012   [name]  @ [place] ([place]   normal nuclear study w/ no ischemia/  normal LV function and wall motion , ef 60% (find in care  everywhere,epic)   CARDIOVERSION N/A 08/31/2020   Procedure: CARDIOVERSION;  Surgeon: [name], MD;  Location: [facility];  Service: Cardiovascular;  Laterality: N/A;   CATARACT EXTRACTION W/ INTRAOCULAR LENS  IMPLANT, BILATERAL  2015   CYSTOSCOPY WITH LITHOLAPAXY N/A 12/25/2017   Procedure: CYSTOSCOPY WITH LITHOLAPAXY AND FULGERATION;  Surgeon: [name], MD;  Location: [facility] [place];  Service: Urology;  Laterality: N/A;   INGUINAL HERNIA REPAIR Right 1980s   LOOP RECORDER INSERTION N/A 04/10/2018   Procedure: LOOP RECORDER INSERTION;  Surgeon: [name], MD;  Location: [place] [facility];  Service: Cardiovascular;  Laterality: N/A;   PENILE PROSTHESIS IMPLANT  12-02-2015   dr [name] @ [facility] in [place] Left 08/2016   TEE WITHOUT CARDIOVERSION N/A 08/31/2020   Procedure: TRANSESOPHAGEAL ECHOCARDIOGRAM (TEE);  Surgeon: [name], MD;  Location: [facility];  Service: Cardiovascular;  Laterality: N/A;   TONSILLECTOMY  child   TRANSURETHRAL RESECTION OF PROSTATE  2009   AND REPAIR RECURRENT RIGHT INGUINAL HERNIA     reports that he quit smoking about 25 years ago. His smoking use included cigarettes. He has never used smokeless tobacco. He reports current alcohol use of about 1.0 standard drink per week. He reports that he does not use drugs.  Allergies  Allergen Reactions   Bee Venom Swelling and Anaphylaxis   Metformin And Related Nausea And Vomiting   Sulfamethoxazole-Trimethoprim Nausea And Vomiting   Bee Pollen Other (See Comments)    Unknown    Metformin Hcl Nausea And Vomiting and Other (See Comments)    Made the patient feel sick     Family History  Problem Relation Age of Onset   Diabetes Mother    Heart failure Mother      Prior to Admission medications   Medication Sig Start Date End Date Taking? Authorizing Provider  acetaminophen (TYLENOL) 325 MG tablet Take 650 mg by mouth 2 (two) times daily  as needed for fever.    [provider]  amiodarone (PACERONE) 200 MG tablet Take 1 tablet (200 mg total) by mouth daily. 09/29/20   Ledora Bottcher, PA  aspirin 325 MG EC tablet Take 325 mg by mouth 3 (three) times a week.    [provider]  B Complex Vitamins (B COMPLEX 100 PO) Take 1 tablet by mouth daily.    [provider]  BD PEN NEEDLE NANO 2ND GEN 32G X 4 MM MISC  07/22/20   [provider]  budesonide (ENTOCORT EC) 3 MG 24 hr capsule Take 9 mg by mouth in the morning.    [provider]  Continuous Blood Gluc Sensor (FREESTYLE LIBRE 2 SENSOR) MISC Inject 1 patch into the skin every 14 (fourteen) days.  03/17/21   [provider]  Cranberry 400 MG TABS Take 400 mg by mouth 2 (two) times daily.    [provider]  Cyanocobalamin (VITAMIN B-12) 5000 MCG SUBL Place 5,000 mcg under the tongue daily.    [provider]  cyclophosphamide (CYTOXAN) 50 MG capsule Take 1 capsule (50 mg total) by mouth daily. Take with food to minimize GI upset. Take early in the day and maintain hydration. 04/25/21   Brunetta Genera, MD  D-[place] 500 MG CAPS Take 500 mg by mouth in the morning and at bedtime.    [provider]  doxycycline (VIBRAMYCIN) 100 MG capsule Take 1 capsule (100 mg total) by mouth at bedtime. 01/02/21   [name], MD  folic acid (FOLVITE) 1 MG tablet TAKE 1 TABLET BY MOUTH EVERY DAY Patient taking differently: Take 1 mg by mouth daily with breakfast. 07/23/20   Brunetta Genera, MD  furosemide (LASIX) 20 MG tablet Take 20 mg for a weight 159 or below. Take 40 mg (2 tablets) for a weight over 160 lbs. Patient taking differently: Take 20 mg by mouth in the morning. 02/28/21   Croitoru, Dani Gobble, MD  metoprolol tartrate (LOPRESSOR) 50 MG tablet Take 50 mg by mouth in the morning and at bedtime.    [provider]  Misc Natural Products (PROSTATE HEALTH) CAPS Take 1 capsule by mouth daily with  breakfast.    [provider]  NOVOLOG FLEXPEN 100 UNIT/ML FlexPen Inject 1-15 Units into the skin 3 (three) times daily with meals. Glucose 121 - 150: 2 units, Glucose 151 - 200: 3 units, Glucose 201 - 250: 5 units, Glucose 251 - 300: 8 units, Glucose 301 - 350: 11 units, Glucose 351 - 400: 15 units, Glucose > 400 call MD 04/07/21   Dwyane Dee, MD  omeprazole (PRILOSEC) 40 MG capsule Take 40 mg by mouth at bedtime.    [provider]  oxybutynin (DITROPAN) 5 MG tablet Take 5 mg by mouth in the morning.    [provider]  pioglitazone (ACTOS) 15 MG tablet Take 15 mg by mouth every morning. 10/18/17   [provider]  [place] 100-33 UNT-MCG/ML SOPN Inject 23 Units into the skin at bedtime.    [provider]    Physical Exam: Vitals:   05/06/21 1900 05/06/21 1953 05/06/21 2000 05/06/21 2149  BP: (!) 165/60 (!) 163/66 (!) 164/67 (!) 156/60  Pulse: 63 73 (!) 109 73  Resp: 15 (!) 22 (!) 26 (!) 22  Temp:  100.3 F (37.9 C)  98.8 F (37.1 C)  TempSrc:  Rectal  Oral  SpO2: 100% 96% 93% 100%  Weight:      Height:        Constitutional: Agitated, confused Eyes: PERRL, lids and conjunctivae normal ENMT: Unable to get him to open mouth for oral exam. Neck: Fairly stiff, unable to get him to touch chin to chest. Respiratory: clear to auscultation bilaterally, no wheezing, no crackles. Normal respiratory effort. No accessory muscle use.  Cardiovascular: Regular rate and rhythm, no murmurs / rubs / gallops. No extremity edema. 2+ pedal pulses. No carotid bruits.  Abdomen: no tenderness, no masses palpated. No hepatosplenomegaly. Bowel sounds positive.  Musculoskeletal: no clubbing / cyanosis. No joint deformity upper and lower extremities. Good ROM, no contractures. Normal muscle tone.  Skin: no rashes, lesions, ulcers. No induration Neurologic: MAE Psychiatric: Confused   Labs on Admission: I have personally reviewed following labs and imaging  studies  CBC: Recent Labs  Lab 05/04/21 0944 05/06/21 1818  WBC 12.9* 28.1*  NEUTROABS 6.5 18.2*  HGB 6.2* 9.5*  HCT 18.7* 28.0*  MCV 88.2 92.4  PLT 240 220   Basic Metabolic Panel: Recent Labs  Lab 05/04/21 0944 05/06/21 1818  NA 136 134*  K 4.0 4.0  CL 102 96*  CO2 25 28  GLUCOSE 294* 299*  BUN 19 18  CREATININE 0.70 0.63  CALCIUM 9.0 9.2   GFR: Estimated Creatinine Clearance: 72.5 mL/min (by C-G formula based on SCr of 0.63 mg/dL). Liver Function Tests: Recent Labs  Lab 05/04/21 0944 05/06/21 1818  AST 14* 22  ALT 45* 52*  ALKPHOS 87 94  BILITOT 1.0 1.2  PROT 5.9* 6.9  ALBUMIN 3.0* 3.6   No results for input(s): LIPASE, AMYLASE in the last 168 hours. No results for input(s): AMMONIA in the last 168 hours. Coagulation Profile: No results for input(s): INR, PROTIME in the last 168 hours. Cardiac Enzymes: No results for input(s): CKTOTAL, CKMB, CKMBINDEX, TROPONINI in the last 168 hours. BNP (last 3 results) No results for input(s): PROBNP in the last 8760 hours. HbA1C: No results for input(s): HGBA1C in the last 72 hours. CBG: No results for input(s): GLUCAP in the last 168 hours. Lipid Profile: No results for input(s): CHOL, HDL, LDLCALC, TRIG, CHOLHDL, LDLDIRECT in the last 72 hours. Thyroid Function Tests: No results for input(s): TSH, T4TOTAL, FREET4, T3FREE, THYROIDAB in the last 72 hours. Anemia Panel: No results for input(s): VITAMINB12, FOLATE, FERRITIN, TIBC, IRON, RETICCTPCT in the last 72 hours. Urine analysis:    Component Value Date/Time   COLORURINE AMBER (A) 05/06/2021 1808   APPEARANCEUR CLOUDY (A) 05/06/2021 1808   LABSPEC 1.021 05/06/2021 1808   PHURINE 6.0 05/06/2021 1808   GLUCOSEU >=500 (A) 05/06/2021 1808   HGBUR SMALL (A) 05/06/2021 1808   BILIRUBINUR NEGATIVE 05/06/2021 1808   BILIRUBINUR negative 10/29/2017 1428   KETONESUR 5 (A) 05/06/2021 1808   PROTEINUR NEGATIVE 05/06/2021 1808   UROBILINOGEN 0.2 10/29/2017 1428    NITRITE NEGATIVE 05/06/2021 1808   LEUKOCYTESUR SMALL (A) 05/06/2021 1808    Radiological Exams on Admission: CT HEAD WO CONTRAST (5MM)  Result Date: 05/06/2021 CLINICAL DATA:  Confusion, altered level of consciousness EXAM: CT HEAD WITHOUT CONTRAST TECHNIQUE: Contiguous axial images were obtained from the base of the skull through the vertex without intravenous contrast. COMPARISON:  01/21/2021 FINDINGS: Brain: No acute infarct or hemorrhage. Lateral ventricles and midline structures are unremarkable. No acute extra-axial fluid collections. No mass effect. Vascular: No hyperdense vessel or unexpected calcification. Skull: Normal. Negative for fracture or focal lesion. Sinuses/Orbits: No acute finding. Other: None. IMPRESSION: 1. Stable head CT, no acute intracranial process. Electronically Signed   By: Randa Ngo M.D.   On: 05/06/2021 19:01   DG Chest Portable 1 View  Result Date: 05/06/2021 CLINICAL DATA:  Altered level of consciousness, confusion EXAM: PORTABLE CHEST 1 VIEW COMPARISON:  04/03/2021 FINDINGS: Single frontal view of the chest demonstrates a stable cardiac silhouette. There is chronic central vascular congestion and interstitial prominence, without focal airspace disease, effusion, or pneumothorax. No acute bony abnormalities. IMPRESSION: 1. Stable chest, no acute process. Electronically Signed   By: Randa Ngo M.D.   On: 05/06/2021 18:50    EKG: Independently reviewed.  Assessment/Plan Principal Problem:   Acute encephalopathy Active Problems:   Essential hypertension   Anemia   Large granular lymphocytic leukemia ([place])   DM type 2 (diabetes mellitus, type 2) ([place])   UTI (  urinary tract infection)   Persistent atrial fibrillation (HCC)    Acute encephalopathy - Delirium, likely infectious given WBC elevation and mild fever (100.3) today in ED. WBC elevated though did just have steroids with PRBC transfusion 2 days ago. Could just be secondary to mild fever and  UTI But cant rule out encephalitis / meningitis at this point with pt reporting headache, back pain and ? Meningismus on exam. EDP unable to safely get LP due to agitation. Will empirically treat with Rocephin, ampicillin, vanc, and acyclovir IR LP ordered for AM CSF cell count, gram stain, culture, fungal culture, HSF PCR, cytology No septic shock or hypotension at this point, but low threshold for steroids.  Dont want to just use empirically on this pt though given that he is already pretty immunosuppressed from daily cytoxan + takes budesonide for collagenous colitis. Hopefully can narrow ABx quickly, pt does have h/o Cdiff in 2017. BCx pending IVF LR at 125 Repeat CBC/CMP in AM UTI - UCx pending Rocephin as above LGL leukemia - Hold cytoxan for the moment (not that hes really safe to take POs anyhow) A.Fib - Not on AC due to h/o GIB May want to get MRI brain if able in AM due to encephlopathy, pt is high risk for stroke. Holding PO meds Start short acting IV meds if needed for RVR DM2 - Hold home insulin Sensitive SSI Q4H HTN - Holding PO meds  DVT prophylaxis: SCDs - delaying lovenox as he needs LP, hopefully by IR in AM Code Status: Full Family Communication: No family in room Disposition Plan: TBD Consults called: None Admission status: Place in 42    Lenell Mcconnell, [place] [facility]  How to contact the [facility] Attending or Consulting provider [place] or covering provider during after hours [place], for this patient?  Check the care team in [facility] and look for a) attending/consulting [facility] provider listed and b) the [facility] team listed Log into [email or web address]  Amion Physician Scheduling and messaging for groups and whole hospitals  On call and physician scheduling software for group practices, residents, hospitalists and other medical providers for call, clinic, rotation and shift schedules. On[employer] is a hospital-wide system for scheduling doctors and paging  doctors on call. EasyPlot is for scientific plotting and data analysis.  [email or web address]  and use [place]'s universal password to access. If you do not have the password, please contact the hospital operator.  Locate the [facility] provider you are looking for under [facility] and page to a number that you can be directly reached. If you still have difficulty reaching the provider, please page the [facility] (Director on Call) for the Hospitalists listed on amion for assistance.  05/06/2021, 11:15 PM

## 2021-05-06 NOTE — ED Triage Notes (Signed)
Patient BIB [facility] from home for confusion. Per EMS family states patient woke up "confused and cranky". Family states this is not patient's baseline.Family states that patient has an abscess in his mouth and possible UTI. Patient states "it burns when he pees". Patient had a recent fall 04/24/21. Unsure if patient sought help for the fall. Per family patient c/o back pain and right rib pain since fall. EMS reports patient was 89% on room air on arrival to scene.

## 2021-05-06 NOTE — ED Provider Notes (Addendum)
[facility] DEPT Provider Note   CSN: [id number] Arrival date & time: 05/06/21  1609     History Chief Complaint  Patient presents with   Altered Mental Status    Tommy Peterson is a 80 y.o. male.   Altered Mental Status Level 5 caveat due to altered mental status.  Reportedly woke up confused.  Reportedly family told had a abscess in mouth and possible UTI.  Patient reportedly had said it burned when he peed.  Patient states he fell yesterday when seen in the ER at [facility].  States nothing was done.  States has had back pain since.  Another note mentions that the fall was on 18 September.  Although he was not seen at that time either.  Has been seen for transfusions.  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 Patient complaining of pain in his back.  States he is going to go back home after he is done here.  States he is going to go back to [place].    Past Medical History:  Diagnosis Date   Acute respiratory failure ([place])    BCC (basal cell carcinoma of skin) 08/15/2016   w/SCC Left Forehead (treatment @ [facility])   BPH (benign prostatic hyperplasia)    Carpal tunnel syndrome on both sides    Chronic diarrhea    intermittant due to chronic collagenous colitis   Chronic large granular lymphocytic leukemia ([place]) primary hemotologist-  [name]. 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--  treatment weekly methotraxate and transfusion's (last PRBCs 07-19-2017)   Collagenous colitis    chronic--- intermittant between diarrahea and constipation   ED (erectile dysfunction)    HCAP (healthcare-associated pneumonia)    History of adenomatous polyp of colon    History of Clostridium difficile colitis 05/2016   History of pneumonia 11/08/2017   CAP -- LLL---   12-19-2017  per pt no residual symptoms   History of SCC (squamous cell carcinoma) of skin    History of sepsis    11-20-2017 severe sepsis due to UTI;  10/ 2017sepsis due to c-diff colitis   Leucocytosis    chronic   Lower urinary tract symptoms (LUTS)    Macrocytic anemia    since 02/ 2015   MGUS (monoclonal gammopathy of unknown significance)    hemotologist-  [name] @ [facility]   Monoclonal paraproteinemia    Neuropathy, peripheral    Nodular basal cell carcinoma (BCC) 10/01/2018   Left Neck(Nodular) Pt Does Not Want Treatment   Nodular basal cell carcinoma (BCC) 10/01/2018   Mid Forehead (treament curet and excision)   OA (osteoarthritis)    Prostatic stone    Rash of face    RIGHT SIDE   Raynaud's syndrome    Recurrent BCC (basal cell carcinoma) 03/05/2019   positive margin   SCC (squamous cell carcinoma) 08/10/2009   Central Forehead (Moh's Dr. Rhona Raider @ [facility])   SCC (squamous cell carcinoma) 12/08/2011   CIS-Left Cheek (treatment Aldara @ [facility])   SCC (squamous cell carcinoma) 07/09/2015   CIS-Left Forehead (treament @ [facility])   Septic shock ([place])    x 3 w/UTI   Squamous cell carcinoma in situ (SCCIS) 12/08/2011   Left Cheek (treatment Aldara @ [facility])    Superficial basal cell carcinoma ([place]) 10/01/2018  Left Shin-Pt does not want treatment   Thin skin    fragile   Transfusion-dependent anemia since 10/ 2017   last transfusion PRBCs 07-19-2017  per hemologist note dated 10-25-2017   Type 2 diabetes mellitus treated with insulin ([place])    followed by [name] (pcp)   Urinary hesitancy    Wears contact lenses    LEFT EYE ONLY    Patient Active Problem List   Diagnosis Date Noted   (HFpEF) heart failure with preserved ejection fraction ([place]) 04/03/2021   Type 2 diabetes mellitus with hyperglycemia ([place]) 04/03/2021   Symptomatic anemia 03/22/2021   Severe sepsis ([place]) 12/31/2020   Epididymitis 12/30/2020   Dehydration 12/30/2020    Hyponatremia 12/30/2020   Head injury 12/30/2020   Sepsis secondary to UTI ([place]) 12/09/2020   Frequent falls 12/09/2020   Acute on chronic heart failure with preserved ejection fraction (HFpEF) ([place]) 11/07/2020   Volume overload 11/06/2020   Chronic anticoagulation 10/11/2020   Persistent atrial fibrillation ([place])    Acute hypoxemic respiratory failure due to COVID-19 ([place]) 04/07/2020   Pressure injury of skin 09/02/2018   UTI (urinary tract infection) 09/01/2018   Sepsis ([place]) 07/28/2018   CAP (community acquired pneumonia) 07/28/2018   Paroxysmal atrial fibrillation ([place]) 04/10/2018   Aortic atherosclerosis ([place]) 04/08/2018   DM type 2 (diabetes mellitus, type 2) ([place]) 02/19/2018   Acute respiratory failure with hypoxia ([place]) 02/18/2018   MGUS (monoclonal gammopathy of unknown significance) 11/23/2017   BPH (benign prostatic hyperplasia) 11/23/2017   Left lower lobe pneumonia 11/20/2017   Nausea vomiting and diarrhea 11/20/2017   Leukemia not having achieved remission ([place]) 12/26/2016   Anemia 12/26/2016   Large granular lymphocytic leukemia ([place]) 12/26/2016   Diabetes mellitus with complication (HCC)    Macrocytic anemia    DOE (dyspnea on exertion)    Anxiety    History of GI bleed    C. difficile colitis 05/31/2016   Lactic acidosis    History of bone marrow biopsy 05/27/2016   Chest pain 05/04/2016   Benign fibroma of prostate 09/20/2015   HLD (hyperlipidemia) 09/20/2015   Essential hypertension 09/20/2015   Arthritis, degenerative 09/20/2015   Colitis, collagenous 09/09/2015   Absolute anemia 09/08/2014   Adenoma of large intestine 07/13/2014   Disturbance of skin sensation 01/09/2014   Recurrent major depressive disorder, in full remission ([place]) 07/18/2012   Allergic rhinitis 08/29/2011    Past Surgical History:  Procedure Laterality Date   BONE MARROW BIOPSY Right 05-24-2016;  08-20-2014   CARDIOVASCULAR STRESS TEST  11-26-2012   [name]  @ [place] ([place])   normal nuclear study w/ no ischemia/  normal LV function and wall motion , ef 60% (find in care everywhere,epic)   CARDIOVERSION N/A 08/31/2020   Procedure: CARDIOVERSION;  Surgeon: [name], MD;  Location: [facility];  Service: Cardiovascular;  Laterality: N/A;   CATARACT EXTRACTION W/ INTRAOCULAR LENS  IMPLANT, BILATERAL  2015   CYSTOSCOPY WITH LITHOLAPAXY N/A 12/25/2017   Procedure: CYSTOSCOPY WITH LITHOLAPAXY AND FULGERATION;  Surgeon: [name], MD;  Location: [facility];  Service: Urology;  Laterality: N/A;   INGUINAL HERNIA REPAIR Right 1980s   LOOP RECORDER INSERTION N/A 04/10/2018   Procedure: LOOP RECORDER INSERTION;  Surgeon: [name], MD;  Location: [facility];  Service: Cardiovascular;  Laterality: N/A;   PENILE PROSTHESIS IMPLANT  12-02-2015   dr [name] @ [place] in [place] Left  08/2016   TEE WITHOUT CARDIOVERSION N/A 08/31/2020   Procedure: TRANSESOPHAGEAL ECHOCARDIOGRAM (TEE);  Surgeon: [name], MD;  Location: [facility];  Service: Cardiovascular;  Laterality: N/A;   TONSILLECTOMY  child   TRANSURETHRAL RESECTION OF PROSTATE  2009   AND REPAIR RECURRENT RIGHT INGUINAL HERNIA       Family History  Problem Relation Age of Onset   Diabetes Mother    Heart failure Mother     Social History   Tobacco Use   Smoking status: Former    Years: 20.00    Types: Cigarettes    Quit date: 05/04/1996    Years since quitting: 25.0   Smokeless tobacco: Never  Vaping Use   Vaping Use: Never used  Substance Use Topics   Alcohol use: Yes    Alcohol/week: 1.0 standard drink    Types: 1 Glasses of wine per week    Comment: 1-2 glasses of wine every other day   Drug use: No    Home Medications Prior to Admission medications   Medication Sig Start Date End Date Taking? Authorizing Provider  acetaminophen (TYLENOL) 325 MG tablet Take 650 mg by mouth 2 (two) times daily as needed  for fever.    [provider]  amiodarone (PACERONE) 200 MG tablet Take 1 tablet (200 mg total) by mouth daily. 09/29/20   Ledora Bottcher, PA  aspirin 325 MG EC tablet Take 325 mg by mouth 3 (three) times a week.    [provider]  B Complex Vitamins (B COMPLEX 100 PO) Take 1 tablet by mouth daily.    [provider]  BD PEN NEEDLE NANO 2ND GEN 32G X 4 MM MISC  07/22/20   [provider]  budesonide (ENTOCORT EC) 3 MG 24 hr capsule Take 9 mg by mouth in the morning.    [provider]  Continuous Blood Gluc Sensor (FREESTYLE LIBRE 2 SENSOR) MISC Inject 1 patch into the skin every 14 (fourteen) days. 03/17/21   [provider]  Cranberry 400 MG TABS Take 400 mg by mouth 2 (two) times daily.    [provider]  Cyanocobalamin (VITAMIN B-12) 5000 MCG SUBL Place 5,000 mcg under the tongue daily.    [provider]  cyclophosphamide (CYTOXAN) 50 MG capsule Take 1 capsule (50 mg total) by mouth daily. Take with food to minimize GI upset. Take early in the day and maintain hydration. 04/25/21   Brunetta Genera, MD  D-Mannose 500 MG CAPS Take 500 mg by mouth in the morning and at bedtime.    [provider]  doxycycline (VIBRAMYCIN) 100 MG capsule Take 1 capsule (100 mg total) by mouth at bedtime. 01/02/21   [name], MD  folic acid (FOLVITE) 1 MG tablet TAKE 1 TABLET BY MOUTH EVERY DAY Patient taking differently: Take 1 mg by mouth daily with breakfast. 07/23/20   Brunetta Genera, MD  furosemide (LASIX) 20 MG tablet Take 20 mg for a weight 159 or below. Take 40 mg (2 tablets) for a weight over 160 lbs. Patient taking differently: Take 20 mg by mouth in the morning. 02/28/21   Croitoru, Dani Gobble, MD  metoprolol tartrate (LOPRESSOR) 50 MG tablet Take 50 mg by mouth in the morning and at bedtime.    [provider]  Misc Natural Products (PROSTATE HEALTH) CAPS Take 1 capsule by mouth daily with breakfast.     [provider]  NOVOLOG FLEXPEN 100 UNIT/ML FlexPen Inject 1-15 Units into the skin  3 (three) times daily with meals. Glucose 121 - 150: 2 units, Glucose 151 - 200: 3 units, Glucose 201 - 250: 5 units, Glucose 251 - 300: 8 units, Glucose 301 - 350: 11 units, Glucose 351 - 400: 15 units, Glucose > 400 call MD 04/07/21   Dwyane Dee, MD  omeprazole (PRILOSEC) 40 MG capsule Take 40 mg by mouth at bedtime.    [provider]  oxybutynin (DITROPAN) 5 MG tablet Take 5 mg by mouth in the morning.    [provider]  pioglitazone (ACTOS) 15 MG tablet Take 15 mg by mouth every morning. 10/18/17   [provider]  [place] 100-33 UNT-MCG/ML SOPN Inject 23 Units into the skin at bedtime.    [provider]    Allergies    Bee venom, Metformin and related, Sulfamethoxazole-trimethoprim, Bee pollen, and Metformin hcl  Review of Systems   Review of Systems  Unable to perform ROS: Mental status change   Physical Exam Updated Vital Signs BP (!) 156/60 (BP Location: Left Arm)   Pulse 73   Temp 98.8 F (37.1 C) (Oral)   Resp (!) 22   Ht $R'5\' 10"'TO$  (1.778 m)   Wt 70.8 kg   SpO2 100%   BMI 22.38 kg/m   Physical Exam Vitals reviewed.  HENT:     Head: Normocephalic and atraumatic.  Eyes:     Pupils: Pupils are equal, round, and reactive to light.  Cardiovascular:     Rate and Rhythm: Regular rhythm.  Pulmonary:     Breath sounds: No stridor. No wheezing.  Abdominal:     Tenderness: There is no abdominal tenderness.  Musculoskeletal:     Cervical back: Neck supple. No tenderness.     Right lower leg: Edema present.     Left lower leg: Edema present.     Comments: Some tenderness over back somewhat diffusely.  No crepitance.  No deformity.  No rash  Skin:    General: Skin is warm.     Capillary Refill: Capillary refill takes less than 2 seconds.  Neurological:     Mental Status: He is alert and oriented to person, place, and time.    ED Results /  Procedures / Treatments   Labs (all labs ordered are listed, but only abnormal results are displayed) Labs Reviewed  URINALYSIS, ROUTINE W REFLEX MICROSCOPIC - Abnormal; Notable for the following components:      Result Value   Color, Urine AMBER (*)    APPearance CLOUDY (*)    Glucose, UA >=500 (*)    Hgb urine dipstick SMALL (*)    Ketones, ur 5 (*)    Leukocytes,Ua SMALL (*)    Bacteria, UA FEW (*)    All other components within normal limits  COMPREHENSIVE METABOLIC PANEL - Abnormal; Notable for the following components:   Sodium 134 (*)    Chloride 96 (*)    Glucose, Bld 299 (*)    ALT 52 (*)    All other components within normal limits  CBC WITH DIFFERENTIAL/PLATELET - Abnormal; Notable for the following components:   WBC 28.1 (*)    RBC 3.03 (*)    Hemoglobin 9.5 (*)    HCT 28.0 (*)    Neutro Abs 18.2 (*)    Lymphs Abs 7.0 (*)    Monocytes Absolute 2.4 (*)    Abs Immature Granulocytes 0.52 (*)    All other components within normal limits  CULTURE, BLOOD (ROUTINE X 2)  CULTURE, BLOOD (  ROUTINE X 2)  URINE CULTURE  LACTIC ACID, PLASMA  LACTIC ACID, PLASMA  SAMPLE TO BLOOD BANK    EKG None  Radiology CT HEAD WO CONTRAST (5MM)  Result Date: 05/06/2021 CLINICAL DATA:  Confusion, altered level of consciousness EXAM: CT HEAD WITHOUT CONTRAST TECHNIQUE: Contiguous axial images were obtained from the base of the skull through the vertex without intravenous contrast. COMPARISON:  01/21/2021 FINDINGS: Brain: No acute infarct or hemorrhage. Lateral ventricles and midline structures are unremarkable. No acute extra-axial fluid collections. No mass effect. Vascular: No hyperdense vessel or unexpected calcification. Skull: Normal. Negative for fracture or focal lesion. Sinuses/Orbits: No acute finding. Other: None. IMPRESSION: 1. Stable head CT, no acute intracranial process. Electronically Signed   By: Randa Ngo M.D.   On: 05/06/2021 19:01   DG Chest Portable 1  View  Result Date: 05/06/2021 CLINICAL DATA:  Altered level of consciousness, confusion EXAM: PORTABLE CHEST 1 VIEW COMPARISON:  04/03/2021 FINDINGS: Single frontal view of the chest demonstrates a stable cardiac silhouette. There is chronic central vascular congestion and interstitial prominence, without focal airspace disease, effusion, or pneumothorax. No acute bony abnormalities. IMPRESSION: 1. Stable chest, no acute process. Electronically Signed   By: Randa Ngo M.D.   On: 05/06/2021 18:50    Procedures Procedures   Medications Ordered in ED Medications  LORazepam (ATIVAN) injection 1 mg (has no administration in time range)  cefTRIAXone (ROCEPHIN) 1 g in sodium chloride 0.9 % 100 mL IVPB (1 g Intravenous New Bag/Given 05/06/21 2153)    ED Course  I have reviewed the triage vital signs and the nursing notes.  Pertinent labs & imaging results that were available during my care of the patient were reviewed by me and considered in my medical decision making (see chart for details).    MDM Rules/Calculators/A&P                           Patient brought in for mental status change.  May have some back pain.  Also more confusion.  More cranky.  Questionable urinary symptoms.  Urine does appear to show infection.  Patient does have an elevated white count of 28, however just had aInfusion of Decadron.  This could account for the leukocytosis.  Normal lactic acid.  Vital signs reassuring.  Question of previous trauma but overall benign exam and chest x-ray reassuring.  Head CT done and reassuring.  However with confusion I feels patient would benefit from admission to the hospital.  Doubt severe sepsis at that time for the leukocytosis.  Patient became more agitated.  Required soft restraints and was given some Ativan Final Clinical Impression(s) / ED Diagnoses Final diagnoses:  Urinary tract infection without hematuria, site unspecified  Encephalopathy acute  Leukocytosis, unspecified  type    Rx / DC Orders ED Discharge Orders     None        Davonna Belling, MD 05/06/21 2225    Davonna Belling, MD 05/06/21 2231

## 2021-05-06 NOTE — Telephone Encounter (Signed)
Patient went to the ED for Altered Mental Status

## 2021-05-06 NOTE — ED Notes (Signed)
Pt is attempting to remove his IV. Pt reports that he is going to "beat the shit" out of this RN.

## 2021-05-06 NOTE — ED Notes (Signed)
Unable to obtain IV and U/S IV

## 2021-05-06 NOTE — Progress Notes (Signed)
Pharmacy Antibiotic Note  Tommy Peterson is a 80 y.o. male admitted on 05/06/2021 with r/o HSV encephalitis/meningitis.  Pharmacy has been consulted for Vancomycin and Acyclovir dosing.  Plan: Acyclovir 10mg /kg IV q8h Vancomycin 1500mg  IV x 1 followed by 1000 mg IV Q 12 hrs. Goal trough for r/o meningitis = 15-20 AUC < 700.  Expected AUC: 607.4  SCr used: 0.8 (rounded up from 0.63) Follow renal function F/u culture results and sensitivities  Height: 5\' 10"  (177.8 cm) Weight: 70.8 kg (156 lb) IBW/kg (Calculated) : 73  Temp (24hrs), Avg:99.4 F (37.4 C), Min:98.8 F (37.1 C), Max:100.3 F (37.9 C)  Recent Labs  Lab 05/04/21 0944 05/06/21 1818 05/06/21 2047  WBC 12.9* 28.1*  --   CREATININE 0.70 0.63  --   LATICACIDVEN  --   --  1.6    Estimated Creatinine Clearance: 72.5 mL/min (by C-G formula based on SCr of 0.63 mg/dL).    Allergies  Allergen Reactions   Bee Venom Swelling and Anaphylaxis   Metformin And Related Nausea And Vomiting   Sulfamethoxazole-Trimethoprim Nausea And Vomiting   Bee Pollen Other (See Comments)    Unknown    Metformin Hcl Nausea And Vomiting and Other (See Comments)    Made the patient feel sick     Antimicrobials this admission: 9/30 Ceftriaxone >>   10/1 Ampicillin >>   10/1 Vancomycin >> 10/1 Acyclovir >>  Dose adjustments this admission:    Microbiology results: 9/30 BCx:   9/30 UCx:     Thank you for allowing pharmacy to be a part of this patient's care.  Everette Rank, PharmD 05/06/2021 11:42 PM

## 2021-05-07 ENCOUNTER — Observation Stay (HOSPITAL_COMMUNITY): Payer: No Typology Code available for payment source

## 2021-05-07 DIAGNOSIS — E1165 Type 2 diabetes mellitus with hyperglycemia: Secondary | ICD-10-CM | POA: Diagnosis present

## 2021-05-07 DIAGNOSIS — E119 Type 2 diabetes mellitus without complications: Secondary | ICD-10-CM | POA: Diagnosis not present

## 2021-05-07 DIAGNOSIS — R519 Headache, unspecified: Secondary | ICD-10-CM | POA: Diagnosis not present

## 2021-05-07 DIAGNOSIS — N390 Urinary tract infection, site not specified: Secondary | ICD-10-CM | POA: Diagnosis present

## 2021-05-07 DIAGNOSIS — B961 Klebsiella pneumoniae [K. pneumoniae] as the cause of diseases classified elsewhere: Secondary | ICD-10-CM | POA: Diagnosis present

## 2021-05-07 DIAGNOSIS — D649 Anemia, unspecified: Secondary | ICD-10-CM | POA: Diagnosis not present

## 2021-05-07 DIAGNOSIS — G9341 Metabolic encephalopathy: Secondary | ICD-10-CM | POA: Diagnosis present

## 2021-05-07 DIAGNOSIS — Z833 Family history of diabetes mellitus: Secondary | ICD-10-CM | POA: Diagnosis not present

## 2021-05-07 DIAGNOSIS — E876 Hypokalemia: Secondary | ICD-10-CM | POA: Diagnosis present

## 2021-05-07 DIAGNOSIS — Z8719 Personal history of other diseases of the digestive system: Secondary | ICD-10-CM | POA: Diagnosis not present

## 2021-05-07 DIAGNOSIS — G9340 Encephalopathy, unspecified: Secondary | ICD-10-CM

## 2021-05-07 DIAGNOSIS — C91Z0 Other lymphoid leukemia not having achieved remission: Secondary | ICD-10-CM | POA: Diagnosis present

## 2021-05-07 DIAGNOSIS — Z882 Allergy status to sulfonamides status: Secondary | ICD-10-CM | POA: Diagnosis not present

## 2021-05-07 DIAGNOSIS — R627 Adult failure to thrive: Secondary | ICD-10-CM | POA: Diagnosis present

## 2021-05-07 DIAGNOSIS — Z20822 Contact with and (suspected) exposure to covid-19: Secondary | ICD-10-CM | POA: Diagnosis present

## 2021-05-07 DIAGNOSIS — J3489 Other specified disorders of nose and nasal sinuses: Secondary | ICD-10-CM | POA: Diagnosis not present

## 2021-05-07 DIAGNOSIS — Z0389 Encounter for observation for other suspected diseases and conditions ruled out: Secondary | ICD-10-CM | POA: Diagnosis not present

## 2021-05-07 DIAGNOSIS — D849 Immunodeficiency, unspecified: Secondary | ICD-10-CM | POA: Diagnosis present

## 2021-05-07 DIAGNOSIS — K52831 Collagenous colitis: Secondary | ICD-10-CM | POA: Diagnosis present

## 2021-05-07 DIAGNOSIS — N400 Enlarged prostate without lower urinary tract symptoms: Secondary | ICD-10-CM | POA: Diagnosis present

## 2021-05-07 DIAGNOSIS — D539 Nutritional anemia, unspecified: Secondary | ICD-10-CM | POA: Diagnosis present

## 2021-05-07 DIAGNOSIS — F05 Delirium due to known physiological condition: Secondary | ICD-10-CM | POA: Diagnosis present

## 2021-05-07 DIAGNOSIS — I4819 Other persistent atrial fibrillation: Secondary | ICD-10-CM | POA: Diagnosis present

## 2021-05-07 DIAGNOSIS — Z1611 Resistance to penicillins: Secondary | ICD-10-CM | POA: Diagnosis present

## 2021-05-07 DIAGNOSIS — W19XXXA Unspecified fall, initial encounter: Secondary | ICD-10-CM | POA: Diagnosis present

## 2021-05-07 DIAGNOSIS — Z8249 Family history of ischemic heart disease and other diseases of the circulatory system: Secondary | ICD-10-CM | POA: Diagnosis not present

## 2021-05-07 DIAGNOSIS — Z85828 Personal history of other malignant neoplasm of skin: Secondary | ICD-10-CM | POA: Diagnosis not present

## 2021-05-07 DIAGNOSIS — Z794 Long term (current) use of insulin: Secondary | ICD-10-CM | POA: Diagnosis not present

## 2021-05-07 DIAGNOSIS — K0889 Other specified disorders of teeth and supporting structures: Secondary | ICD-10-CM | POA: Diagnosis present

## 2021-05-07 DIAGNOSIS — R32 Unspecified urinary incontinence: Secondary | ICD-10-CM | POA: Diagnosis present

## 2021-05-07 DIAGNOSIS — I10 Essential (primary) hypertension: Secondary | ICD-10-CM | POA: Diagnosis present

## 2021-05-07 LAB — BLOOD GAS, ARTERIAL
Acid-Base Excess: 5 mmol/L — ABNORMAL HIGH (ref 0.0–2.0)
Bicarbonate: 28.4 mmol/L — ABNORMAL HIGH (ref 20.0–28.0)
O2 Saturation: 92.1 %
Patient temperature: 98.6
pCO2 arterial: 38.3 mmHg (ref 32.0–48.0)
pH, Arterial: 7.483 — ABNORMAL HIGH (ref 7.350–7.450)
pO2, Arterial: 62.7 mmHg — ABNORMAL LOW (ref 83.0–108.0)

## 2021-05-07 LAB — CSF CELL COUNT WITH DIFFERENTIAL
RBC Count, CSF: 5 /mm3 — ABNORMAL HIGH
Tube #: 4
WBC, CSF: 0 /mm3 (ref 0–5)

## 2021-05-07 LAB — COMPREHENSIVE METABOLIC PANEL
ALT: 43 U/L (ref 0–44)
AST: 19 U/L (ref 15–41)
Albumin: 3.1 g/dL — ABNORMAL LOW (ref 3.5–5.0)
Alkaline Phosphatase: 83 U/L (ref 38–126)
Anion gap: 8 (ref 5–15)
BUN: 10 mg/dL (ref 8–23)
CO2: 27 mmol/L (ref 22–32)
Calcium: 8.6 mg/dL — ABNORMAL LOW (ref 8.9–10.3)
Chloride: 98 mmol/L (ref 98–111)
Creatinine, Ser: 0.44 mg/dL — ABNORMAL LOW (ref 0.61–1.24)
GFR, Estimated: >60 mL/min (ref >60)
Glucose, Bld: 229 mg/dL — ABNORMAL HIGH (ref 70–99)
Potassium: 3.4 mmol/L — ABNORMAL LOW (ref 3.5–5.1)
Sodium: 133 mmol/L — ABNORMAL LOW (ref 135–145)
Total Bilirubin: 0.9 mg/dL (ref 0.3–1.2)
Total Protein: 6.2 g/dL — ABNORMAL LOW (ref 6.5–8.1)

## 2021-05-07 LAB — CBC
HCT: 24.5 % — ABNORMAL LOW (ref 39.0–52.0)
Hemoglobin: 8.5 g/dL — ABNORMAL LOW (ref 13.0–17.0)
MCH: 32 pg (ref 26.0–34.0)
MCHC: 34.7 g/dL (ref 30.0–36.0)
MCV: 92.1 fL (ref 80.0–100.0)
Platelets: 231 10*3/uL (ref 150–400)
RBC: 2.66 MIL/uL — ABNORMAL LOW (ref 4.22–5.81)
RDW: 14.9 % (ref 11.5–15.5)
WBC: 22.4 10*3/uL — ABNORMAL HIGH (ref 4.0–10.5)
nRBC: 0 % (ref 0.0–0.2)

## 2021-05-07 LAB — GLUCOSE, CAPILLARY
Glucose-Capillary: 230 mg/dL — ABNORMAL HIGH (ref 70–99)
Glucose-Capillary: 205 mg/dL — ABNORMAL HIGH (ref 70–99)
Glucose-Capillary: 151 mg/dL — ABNORMAL HIGH (ref 70–99)
Glucose-Capillary: 94 mg/dL (ref 70–99)
Glucose-Capillary: 102 mg/dL — ABNORMAL HIGH (ref 70–99)
Glucose-Capillary: 159 mg/dL — ABNORMAL HIGH (ref 70–99)
Glucose-Capillary: 183 mg/dL — ABNORMAL HIGH (ref 70–99)

## 2021-05-07 LAB — RESP PANEL BY RT-PCR (FLU A&B, COVID) ARPGX2
Influenza A by PCR: NEGATIVE
Influenza B by PCR: NEGATIVE
SARS Coronavirus 2 by RT PCR: NEGATIVE

## 2021-05-07 LAB — PROTEIN AND GLUCOSE, CSF
Glucose, CSF: 100 mg/dL — ABNORMAL HIGH (ref 40–70)
Total  Protein, CSF: 70 mg/dL — ABNORMAL HIGH (ref 15–45)

## 2021-05-07 LAB — CBG MONITORING, ED: Glucose-Capillary: 242 mg/dL — ABNORMAL HIGH (ref 70–99)

## 2021-05-07 LAB — LACTIC ACID, PLASMA: Lactic Acid, Venous: 1.6 mmol/L (ref 0.5–1.9)

## 2021-05-07 LAB — MRSA NEXT GEN BY PCR, NASAL: MRSA by PCR Next Gen: NOT DETECTED

## 2021-05-07 LAB — AMMONIA: Ammonia: 16 umol/L (ref 9–35)

## 2021-05-07 IMAGING — RF DG FLUORO GUIDE SPINAL/SI JT INJ*L*
7 series · 9 of 9 positions shown · non-contrast
Comparison: Priors

CLINICAL DATA: Patient for diagnostic lumbar puncture.

EXAM:
DIAGNOSTIC LUMBAR PUNCTURE UNDER FLUOROSCOPIC GUIDANCE

[Series 1: fluoro_barium 2fps_bw · 0.21mm/px · 1 of 1 slices shown (1 of 5)]
[im 1/1]
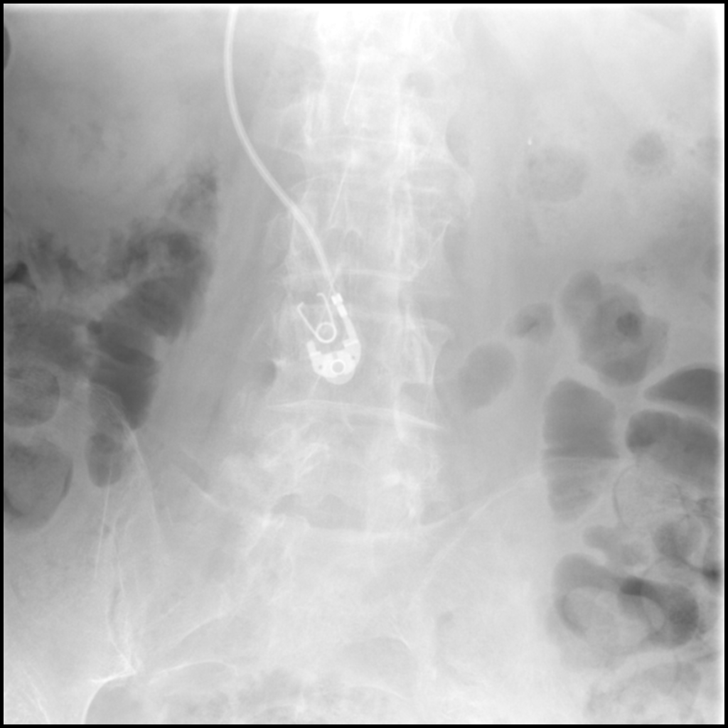

[Series 2: fluoro_barium 2fps_bw · 0.21mm/px · 2 of 2 frames shown (2 of 5)]
[frame 1/2]
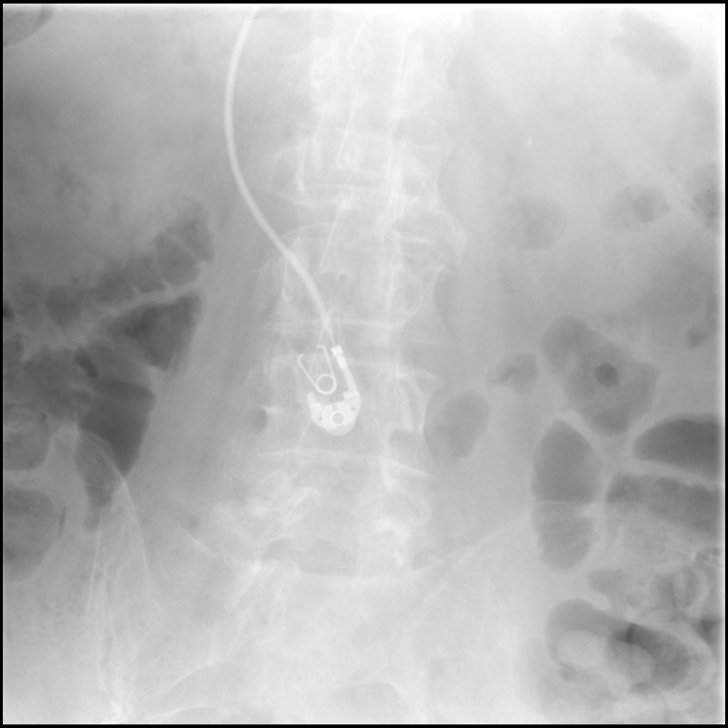
[frame 2/2]
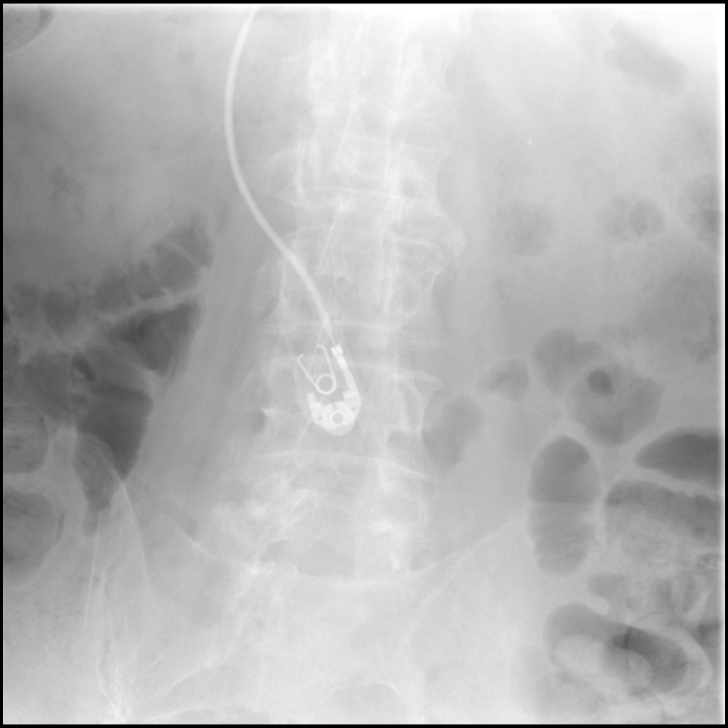

[Series 3: fluoro_barium 2fps_bw · 0.20mm/px · 1 of 1 slices shown (3 of 5)]
[im 1/1]
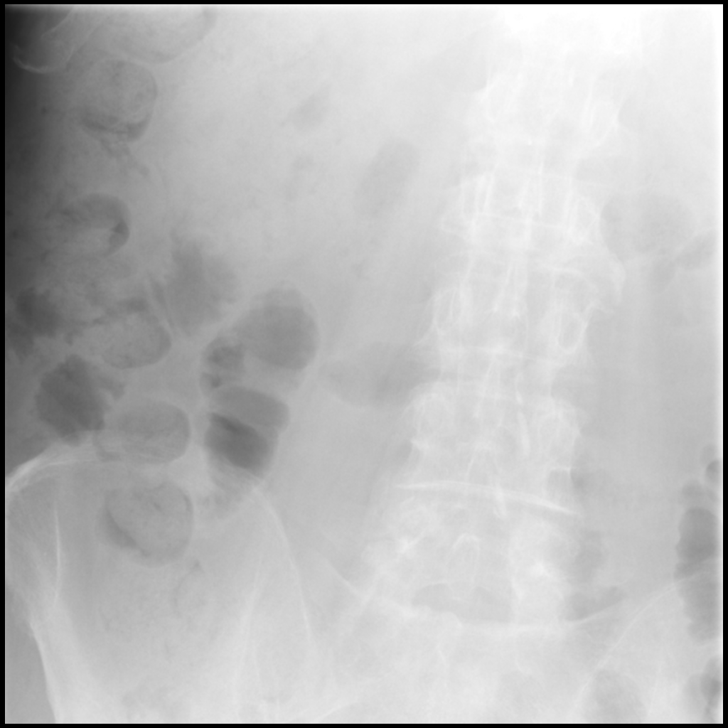

[Series 4: fluoro_barium 2fps_bw · 0.20mm/px · 1 of 1 slices shown (4 of 5)]
[im 1/1]
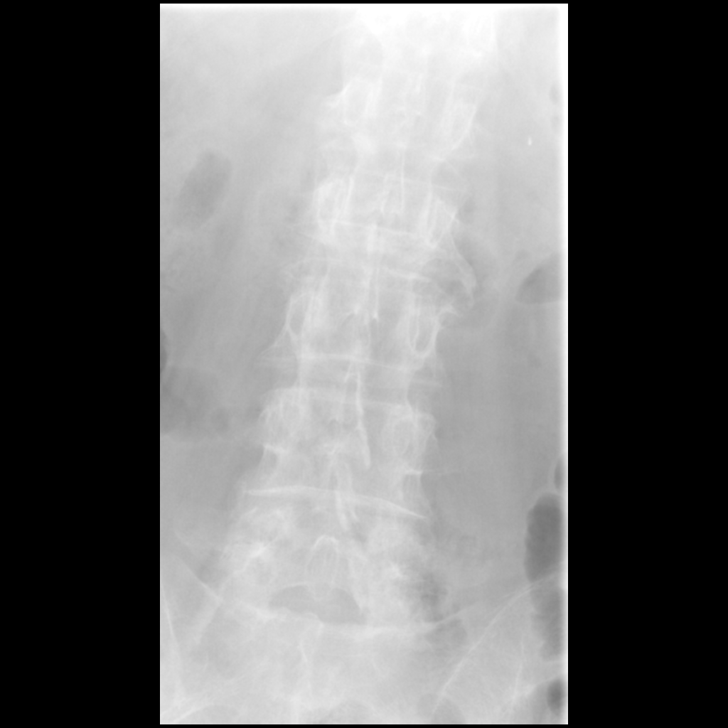

[Series 5: fluoro_barium 2fps_bw · 0.21mm/px · 2 of 2 frames shown (5 of 5)]
[frame 1/2]
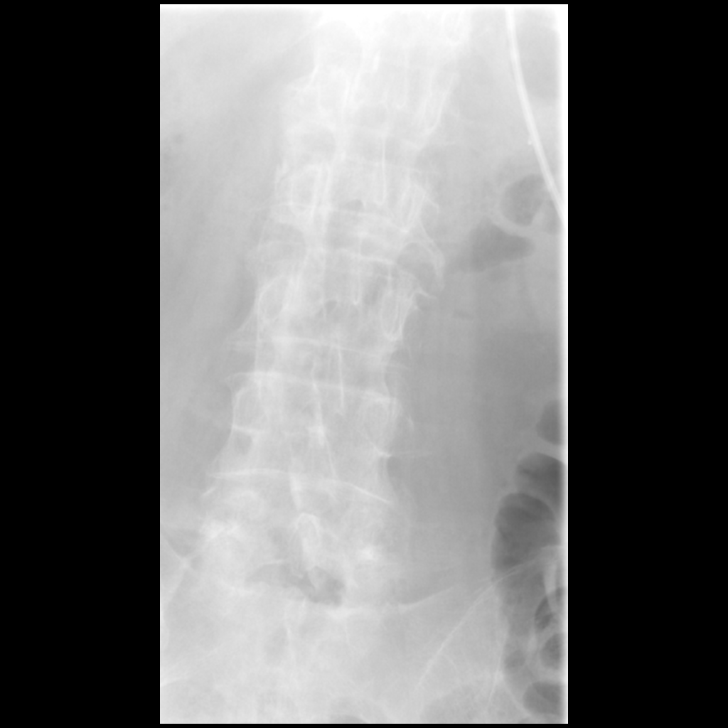
[frame 2/2]
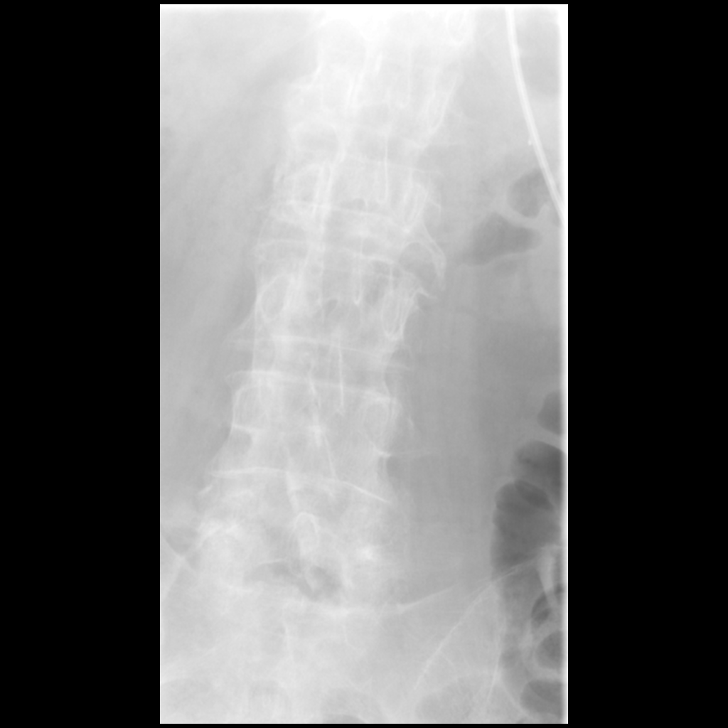

[Series 6: cp_standard · 0.21mm/px · 1 of 1 slices shown (1 of 2)]
[im 1/1]
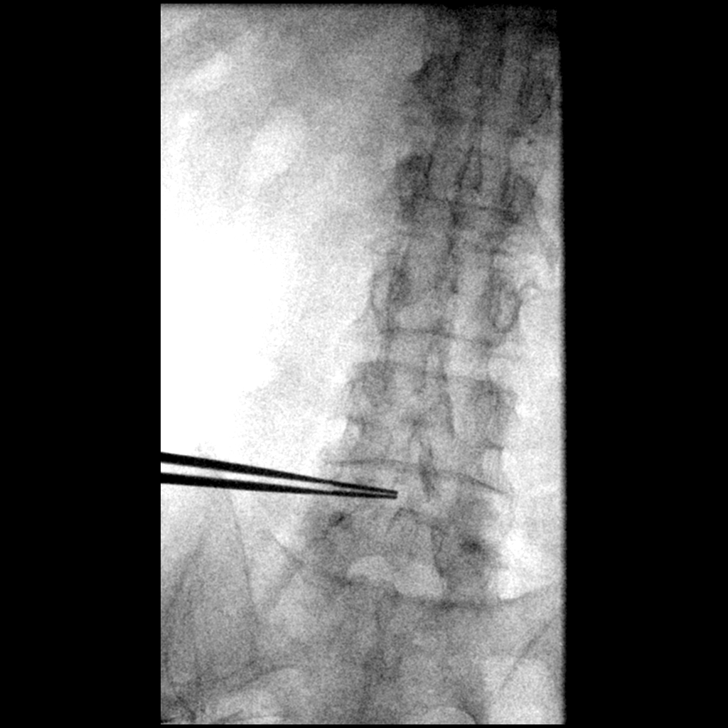

[Series 7: cp_standard · 0.17mm/px · 1 of 1 slices shown (2 of 2)]
[im 1/1]
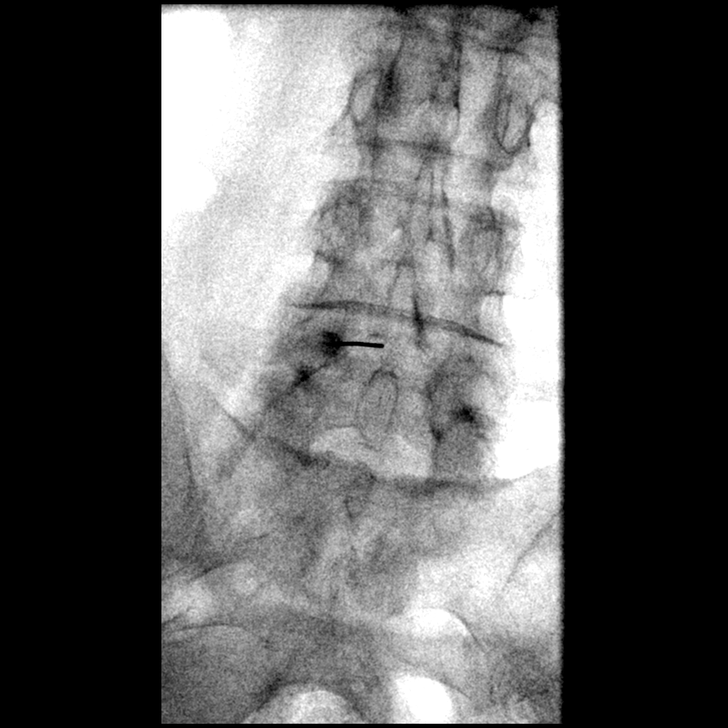

[9 of 9 positions shown; findings below may reference images not displayed]

FLUOROSCOPY TIME:  Fluoroscopy Time:  30 seconds

Radiation Exposure Index (if provided by the fluoroscopic device):
6.5 mGy

Number of Acquired Spot Images: 5

PROCEDURE:
Informed consent was obtained from the patient prior to the
procedure, including potential complications of headache, allergy,
and pain. With the patient prone, the lower back was prepped with
Betadine. 1% Lidocaine was used for local anesthesia. Lumbar
puncture was performed at the L4-5 level using a 20 gauge needle
with return of clear CSF. 7 ml of CSF were obtained for laboratory
studies. The patient tolerated the procedure well and there were no
apparent complications.
IMPRESSION: Successful lumbar puncture.

## 2021-05-07 MED ORDER — CHLORHEXIDINE GLUCONATE CLOTH 2 % EX PADS
6.0000 | MEDICATED_PAD | Freq: Every day | CUTANEOUS | Status: DC
  Administered 2021-05-07 – 2021-05-10 (×5): 6 via TOPICAL

## 2021-05-07 MED ORDER — MORPHINE SULFATE (PF) 2 MG/ML IV SOLN
2.0000 mg | Freq: Once | INTRAVENOUS | Status: AC
  Administered 2021-05-07: 2 mg via INTRAVENOUS
  Filled 2021-05-07: qty 1

## 2021-05-07 MED ORDER — SODIUM CHLORIDE 0.9 % IV SOLN
2.0000 g | INTRAVENOUS | Status: DC
  Administered 2021-05-08 – 2021-05-09 (×2): 2 g via INTRAVENOUS
  Filled 2021-05-07 (×3): qty 20

## 2021-05-07 MED ORDER — SODIUM CHLORIDE 0.9% FLUSH: 10.0000 mL | INTRAVENOUS | Status: DC | PRN

## 2021-05-07 MED ORDER — SODIUM CHLORIDE 0.9 % IV SOLN: 1.0000 g | INTRAVENOUS | Status: DC

## 2021-05-07 MED ORDER — SODIUM CHLORIDE 0.9% FLUSH
10.0000 mL | Freq: Two times a day (BID) | INTRAVENOUS | Status: DC
Start: 2021-05-07 — End: 2021-05-10
  Administered 2021-05-07: 10 mL

## 2021-05-07 NOTE — Progress Notes (Addendum)
No charge note.  LP results show 0 WBC, nothing on gram stain.  Can safely rule out acute bacterial meningitis at this point.  MRSA PCR neg.  Spoke with Dr. Erlinda Hong, will de-escalate ABx: 1) stopping ampicillin 2) stopping vanc 3) reduce rocephin dosing to just 2g daily instead of the Q12H CNS dosing 4) will continue acyclovir pending HSV results 5) will order CT maxilofacial given the dental pain history, may need to add anaerobic coverage (flagyl) if dental infection present since stopping ampicillin.

## 2021-05-07 NOTE — Procedures (Signed)
CLINICAL DATA: [Patient for diagnostic lumbar puncture.] EXAM: DIAGNOSTIC LUMBAR PUNCTURE UNDER FLUOROSCOPIC GUIDANCE COMPARISON: [[facility]] FLUOROSCOPY TIME: Fluoroscopy Time:  [30 seconds] Radiation Exposure Index (if provided by the fluoroscopic device):  [6.5 mGy] Number of Acquired Spot Images: [5] PROCEDURE: Informed consent was obtained from the patient prior to the procedure, including potential complications of headache, allergy, and pain.  With the patient prone, the lower back was prepped with Betadine.  1% Lidocaine was used for local anesthesia.  Lumbar puncture was performed at the [L4-5] level using a [20] gauge needle with return of [clear] CSF.  [7] ml of CSF were obtained for laboratory studies.  The patient tolerated the procedure well and there were no apparent complications. IMPRESSION: [Successful lumbar puncture.]

## 2021-05-07 NOTE — Progress Notes (Signed)
Pt confused, verbally and physically aggressive to this RN, attempting to kick staff while in bed, yelling profanities at staff

## 2021-05-07 NOTE — Progress Notes (Signed)
PROGRESS NOTE    Tommy Peterson  QIW:[id number] DOB: 14-Nov-1940 DOA: 05/06/2021 PCP: [name], DO    Chief Complaint  Patient presents with   Altered Mental Status    Brief Narrative:   Tommy Peterson is a 80 y.o. male with medical history significant of LGL leukemia, A.Fib not on AC, prior GIB, chronic anemia gets frequent transfusions (due to leukemia).   Pt had transfusion for leukemia earlier this week.   Pt recently started on cytoxan for leukemia.   Pt presents to ED with 1 day h/o confusion.  Worsening, now severe with agitation in ED.   Pt does have back pain and headache.  Also has dysuria.  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[place])   DM type 2 (diabetes mellitus, type 2) (HCC)   UTI (urinary tract infection)   Persistent atrial fibrillation (HCC)  Acute metabolic encephalopathy -sudden mental status changes on Friday, was acting normal on Thursday -ct head no acute findings, -EDP attempted LP but unsuccessful, fluroscope guided LP ordered last night,  -will add on abg, ( no hypxia, lung clears though), ammonia, mri brain -blood culture no growth so far, urine culture in process, cxr no acute findings -currently npo due to encephalopathy  Family reports patient had  dental pain on left side recently Consider ct maxillofacial     Insulin dependent dm2, uncontrolled with hyperglycemia Family reports patient is forgetaful. Forget to take him meds A1c 9.8  PAF On amiodarone, betablocker  Was on apixaban but recently taken off   large granular lymphocytic leukemia, transfusion-dependent anemia, immunosuppressed status Getting prbc transfusion about every two weeks, last on 9/29 Was  methotraxate , recently changed to cytoxan  FTT Fell on 9/18, per family was accidental fall Fell on right side with right flank pain Cxr no fracture     Nutritional Assessment:  The patient's BMI is: Body mass index is 22.38 kg/m.Marland Kitchen       Unresulted Labs (From admission, onward)     Start     Ordered   05/07/21 1034  Resp Panel by RT-PCR (Flu A&B, Covid) Nasopharyngeal Swab  (Tier 2 - Symptomatic/asymptomatic with Precautions )  Once,   R       Question Answer Comment  Is this test for diagnosis or screening Screening   Symptomatic for COVID-19 as defined by CDC No   Hospitalized for COVID-19 No   Admitted to ICU for COVID-19 No   Previously tested for COVID-19 Yes   Resident in a congregate (group) care setting No   Employed in healthcare setting No   Has patient completed COVID vaccination(s) (2 doses of Pfizer/Moderna 1 dose of [place]) Unknown      05/07/21 1033   05/06/21 2323  HSV 1/2 PCR, CSF Cerebrospinal Fluid  Once,   R        05/06/21 2322   05/06/21 1918  Urine Culture  Once,   STAT       Question:  Indication  Answer:  Altered mental status (if no other cause identified)   05/06/21 1917              DVT prophylaxis: Place and maintain sequential compression device Start: 05/06/21 2329  Code Status: full, daughter will double check  Family Communication: daughter Tommy Peterson over the phone [phone] Disposition:   Dispo: The patient is from: home              Anticipated d/c is to: TBD              Anticipated d/c date is: TBD                Consultants:  Radiology for fluroscope guided LP  Procedures:  LP  Antimicrobials:    Anti-infectives (From admission, onward)    Start     Dose/Rate Route Frequency Ordered Stop   05/07/21 2200  cefTRIAXone (ROCEPHIN) 1 g in sodium chloride 0.9 % 100 mL IVPB  Status:  Discontinued        1 g 200 mL/hr over 30 Minutes Intravenous Every 24 hours 05/06/21 2240 05/06/21 2301   05/07/21 1500   vancomycin (VANCOCIN) IVPB 1000 mg/200 mL premix        1,000 mg 200 mL/hr over 60 Minutes Intravenous Every 12 hours 05/06/21 2351     05/07/21 1000  cefTRIAXone (ROCEPHIN) 2 g in sodium chloride 0.9 % 100 mL IVPB        2 g 200 mL/hr over 30 Minutes Intravenous Every 12 hours 05/06/21 2301     05/07/21 0000  ampicillin (OMNIPEN) 2 g in sodium chloride 0.9 % 100 mL IVPB        2 g 300 mL/hr over 20 Minutes Intravenous Every 4 hours 05/06/21 2303     05/06/21 2359  acyclovir (ZOVIRAX) 710 mg in dextrose 5 % 150 mL IVPB        10 mg/kg  70.8 kg 164.2 mL/hr over 60 Minutes Intravenous Every 8 hours 05/06/21 2310     05/06/21 2315  cefTRIAXone (ROCEPHIN) 1 g in sodium chloride 0.9 % 100 mL IVPB        1 g 200 mL/hr over 30 Minutes Intravenous  Once 05/06/21 2301 05/07/21 0218   05/06/21 2315  vancomycin (VANCOREADY) IVPB 1500 mg/300 mL        1,500 mg 150 mL/hr over 120 Minutes Intravenous STAT 05/06/21 2308 05/07/21 0524   05/06/21 1930  cefTRIAXone (ROCEPHIN) 1 g in sodium chloride 0.9 % 100 mL IVPB        1 g 200 mL/hr over 30 Minutes Intravenous  Once 05/06/21 1917 05/06/21 2223          Objective: Vitals:   05/07/21 0435 05/07/21 0500 05/07/21 0600 05/07/21 0800  BP: (!) 164/60 (!) 146/41 (!) 165/55   Pulse: 63 60 66   Resp: 15 18 16    Temp:    98.7 F (37.1 C)  TempSrc:    Axillary  SpO2: 95% 94% 96%   Weight:      Height:        Intake/Output Summary (Last 24 hours) at 05/07/2021 1044 Last data filed at 05/07/2021 0651 Gross per 24 hour  Intake 1210.44 ml  Output 300 ml  Net 910.44 ml   Filed Weights   05/06/21 1650  Weight: 70.8 kg    Examination:  General exam: lethargic, confused, currently no agitation Respiratory system: Clear to auscultation. Respiratory effort normal. Cardiovascular system: S1 & S2 heard, RRR.  No pedal edema. Gastrointestinal system: Abdomen is nondistended, soft and nontender. Normal bowel sounds heard. Central nervous system:  lethargic, confused, follow commands. Extremities: No edema Skin: No rashes, lesions or ulcers Psychiatry: lethargic, confused, currently no  agitation    Data Reviewed: I have personally reviewed following labs and imaging studies  CBC: Recent Labs  Lab 05/04/21 0944 05/06/21 1818 05/07/21 0317  WBC 12.9* 28.1* 22.4*  NEUTROABS 6.5 18.2*  --   HGB 6.2* 9.5* 8.5*  HCT 18.7* 28.0* 24.5*  MCV 88.2 92.4 92.1  PLT 240 255 063    Basic Metabolic Panel: Recent Labs  Lab 05/04/21 0944 05/06/21 1818 05/07/21 0317  NA 136 134* 133*  K 4.0 4.0 3.4*  CL 102 96* 98  CO2 25 28 27   GLUCOSE 294* 299* 229*  BUN 19 18 10   CREATININE 0.70 0.63 0.44*  CALCIUM 9.0 9.2 8.6*    GFR: Estimated Creatinine Clearance: 72.5 mL/min (A) (by C-G formula based on SCr of 0.44 mg/dL (L)).  Liver Function Tests: Recent Labs  Lab 05/04/21 0944 05/06/21 1818 05/07/21 0317  AST 14* 22 19  ALT 45* 52* 43  ALKPHOS 87 94 83  BILITOT 1.0 1.2 0.9  PROT 5.9* 6.9 6.2*  ALBUMIN 3.0* 3.6 3.1*    CBG: Recent Labs  Lab 05/07/21 0205 05/07/21 0321 05/07/21 0436 05/07/21 0728  GLUCAP 242* 230* 205* 151*     Recent Results (from the past 240 hour(s))  Culture, blood (routine x 2)     Status: None (Preliminary result)   Collection Time: 05/06/21  8:45 PM   Specimen: BLOOD  Result Value Ref Range Status   Specimen Description   Final    BLOOD LEFT Performed at [facility], [place] [address] [place], [place] [address]    Special Requests   Final    BOTTLES DRAWN AEROBIC AND ANAEROBIC Blood Culture adequate volume Performed at [facility] [address] [place], [place] [address]    Culture   Final    NO GROWTH < 12 HOURS Performed at [facility] [address] [place], [place] [address]    Report Status PENDING  Incomplete  Culture, blood (routine x 2)     Status: None (Preliminary result)   Collection Time: 05/06/21  9:05 PM    Specimen: BLOOD  Result Value Ref Range Status   Specimen Description   Final    BLOOD UPPER BLOOD LEFT ARM Performed at [facility] [address], [place], [place] [address]    Special Requests   Final    BOTTLES DRAWN AEROBIC AND ANAEROBIC Blood Culture adequate volume Performed at [facility] [address] [place], [place] [address]    Culture   Final    NO GROWTH < 12 HOURS Performed at [facility] [address], [place], [place] [address]    Report Status PENDING  Incomplete  MRSA Next Gen by PCR, Nasal     Status: None   Collection Time: 05/07/21  3:11 AM   Specimen: Nasal Mucosa; Nasal Swab  Result Value Ref Range Status   MRSA by PCR Next Gen NOT DETECTED NOT DETECTED Final    Comment: (NOTE) The GeneXpert MRSA Assay (FDA approved for NASAL specimens only), is one component of a comprehensive MRSA colonization surveillance program. It is not intended to diagnose MRSA infection nor to guide or monitor treatment for MRSA infections. Test performance is not FDA approved in patients less than 35 years old. Performed at [facility], [place] [address], [place],  [address]          Radiology Studies: CT HEAD WO CONTRAST (5MM)  Result  Date: 05/06/2021 CLINICAL DATA:  Confusion, altered level of consciousness EXAM: CT HEAD WITHOUT CONTRAST TECHNIQUE: Contiguous axial images were obtained from the base of the skull through the vertex without intravenous contrast. COMPARISON:  01/21/2021 FINDINGS: Brain: No acute infarct or hemorrhage. Lateral ventricles and midline structures are unremarkable. No acute extra-axial fluid collections. No mass effect. Vascular: No hyperdense vessel or unexpected calcification. Skull: Normal. Negative for fracture or focal lesion. Sinuses/Orbits: No acute finding. Other: None. IMPRESSION: 1. Stable head CT, no acute intracranial process. Electronically Signed   By: Randa Ngo M.D.   On: 05/06/2021 19:01   DG Chest Portable 1 View  Result Date: 05/06/2021 CLINICAL DATA:  Altered level of consciousness, confusion EXAM: PORTABLE CHEST 1 VIEW COMPARISON:  04/03/2021 FINDINGS: Single frontal view of the chest demonstrates a stable cardiac silhouette. There is chronic central vascular congestion and interstitial prominence, without focal airspace disease, effusion, or pneumothorax. No acute bony abnormalities. IMPRESSION: 1. Stable chest, no acute process. Electronically Signed   By: Randa Ngo M.D.   On: 05/06/2021 18:50        Scheduled Meds:  Chlorhexidine Gluconate Cloth  6 each Topical Q0600   insulin aspart  0-9 Units Subcutaneous Q4H   sodium chloride flush  10-40 mL Intracatheter Q12H   Continuous Infusions:  acyclovir (ZOVIRAX) [phone] mg IVPB Stopped (05/07/21 0545)   ampicillin (OMNIPEN) IV 2 g (05/07/21 0840)   cefTRIAXone (ROCEPHIN)  IV     lactated ringers 125 mL/hr at 05/07/21 0555   vancomycin       LOS: 0 days   Time spent: 37mins Greater than 50% of this time was spent in counseling, explanation of diagnosis, planning of further management, and coordination of care.   Voice Recognition Viviann Spare dictation system was used to create this note, attempts have been made to correct errors. Please contact the author with questions and/or clarifications.   Florencia Reasons, MD PhD FACP [facility]  Available via Epic secure chat 7am-7pm for nonurgent issues Please page for urgent issues To page the attending provider between 7A-7P or the covering provider during after hours 7P-7A, please log into the web site [email or web address] and access using universal [place] password for that web site. If you do not have the password, please call the hospital operator.    05/07/2021, 10:44 AM

## 2021-05-08 ENCOUNTER — Inpatient Hospital Stay (HOSPITAL_COMMUNITY): Payer: No Typology Code available for payment source

## 2021-05-08 LAB — CBC WITH DIFFERENTIAL/PLATELET
Abs Immature Granulocytes: 0.25 10*3/uL — ABNORMAL HIGH (ref 0.00–0.07)
Basophils Absolute: 0 10*3/uL (ref 0.0–0.1)
Basophils Relative: 0 %
Eosinophils Absolute: 0 10*3/uL (ref 0.0–0.5)
Eosinophils Relative: 0 %
HCT: 22.6 % — ABNORMAL LOW (ref 39.0–52.0)
Hemoglobin: 7.5 g/dL — ABNORMAL LOW (ref 13.0–17.0)
Immature Granulocytes: 2 %
Lymphocytes Relative: 33 %
Lymphs Abs: 4.7 10*3/uL — ABNORMAL HIGH (ref 0.7–4.0)
MCH: 29.8 pg (ref 26.0–34.0)
MCHC: 33.2 g/dL (ref 30.0–36.0)
MCV: 89.7 fL (ref 80.0–100.0)
Monocytes Absolute: 1.5 10*3/uL — ABNORMAL HIGH (ref 0.1–1.0)
Monocytes Relative: 11 %
Neutro Abs: 7.5 10*3/uL (ref 1.7–7.7)
Neutrophils Relative %: 54 %
Platelets: 204 10*3/uL (ref 150–400)
RBC: 2.52 MIL/uL — ABNORMAL LOW (ref 4.22–5.81)
RDW: 14.7 % (ref 11.5–15.5)
WBC: 14 10*3/uL — ABNORMAL HIGH (ref 4.0–10.5)
nRBC: 0 % (ref 0.0–0.2)

## 2021-05-08 LAB — BASIC METABOLIC PANEL
Anion gap: 8 (ref 5–15)
BUN: 6 mg/dL — ABNORMAL LOW (ref 8–23)
CO2: 28 mmol/L (ref 22–32)
Calcium: 8.4 mg/dL — ABNORMAL LOW (ref 8.9–10.3)
Chloride: 101 mmol/L (ref 98–111)
Creatinine, Ser: 0.38 mg/dL — ABNORMAL LOW (ref 0.61–1.24)
GFR, Estimated: >60 mL/min (ref >60)
Glucose, Bld: 147 mg/dL — ABNORMAL HIGH (ref 70–99)
Potassium: 2.7 mmol/L — CL (ref 3.5–5.1)
Sodium: 137 mmol/L (ref 135–145)

## 2021-05-08 LAB — MAGNESIUM: Magnesium: 1.7 mg/dL (ref 1.7–2.4)

## 2021-05-08 LAB — GLUCOSE, CAPILLARY
Glucose-Capillary: 121 mg/dL — ABNORMAL HIGH (ref 70–99)
Glucose-Capillary: 139 mg/dL — ABNORMAL HIGH (ref 70–99)
Glucose-Capillary: 294 mg/dL — ABNORMAL HIGH (ref 70–99)
Glucose-Capillary: 191 mg/dL — ABNORMAL HIGH (ref 70–99)
Glucose-Capillary: 285 mg/dL — ABNORMAL HIGH (ref 70–99)

## 2021-05-08 IMAGING — CT CT MAXILLOFACIAL W/O CM
3 series · 16 of 47 positions shown, 19 images · non-contrast
Comparison: Head CT from 2 days ago

CLINICAL DATA: Maxillofacial dental pain, question abscess

EXAM:
CT MAXILLOFACIAL WITHOUT CONTRAST
TECHNIQUE: Multidetector CT imaging of the maxillofacial structures was
performed. Multiplanar CT image reconstructions were also generated.

[Series 4: max soft · axial · 0.39mm/px · z∈[-132,+32]mm · 10 of 96 slices shown, 13 images]
[im 7/96  brain]
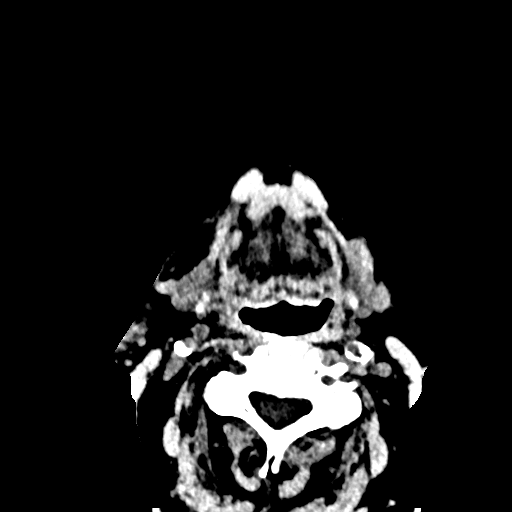
[im 7/96  bone]
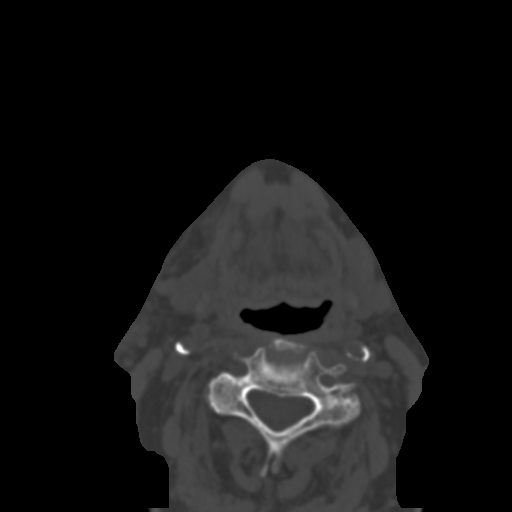
[im 17/96  bone]
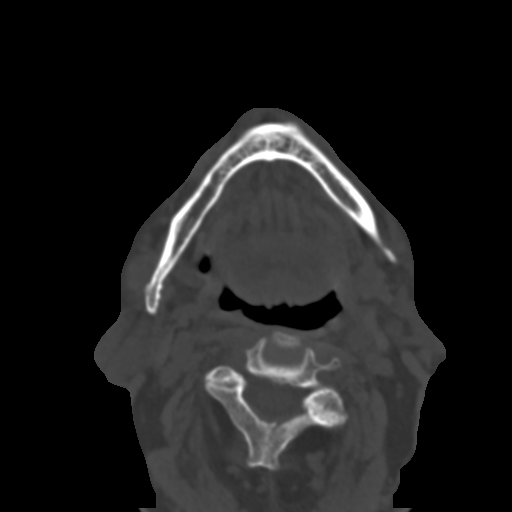
[im 27/96  bone]
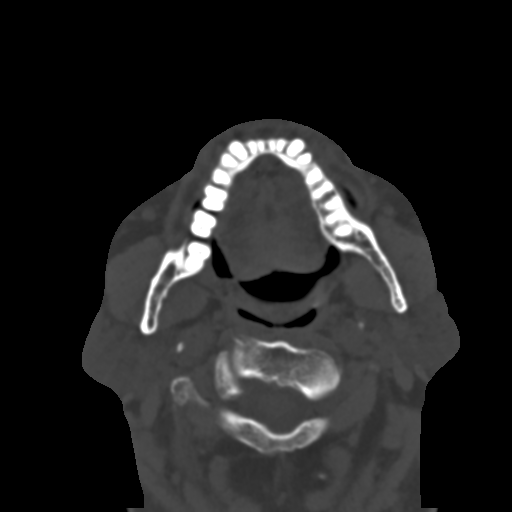
[im 33/96  bone]
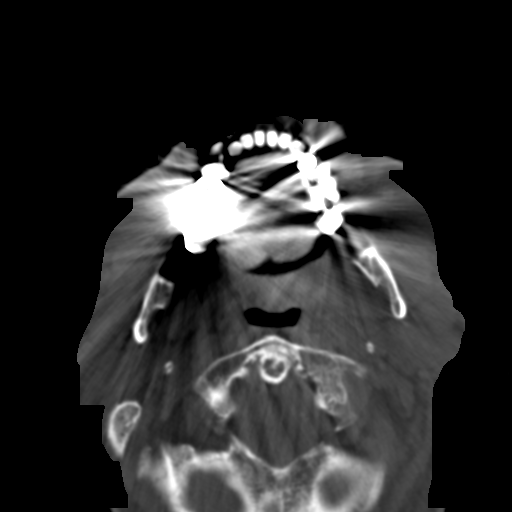
[im 43/96  brain]
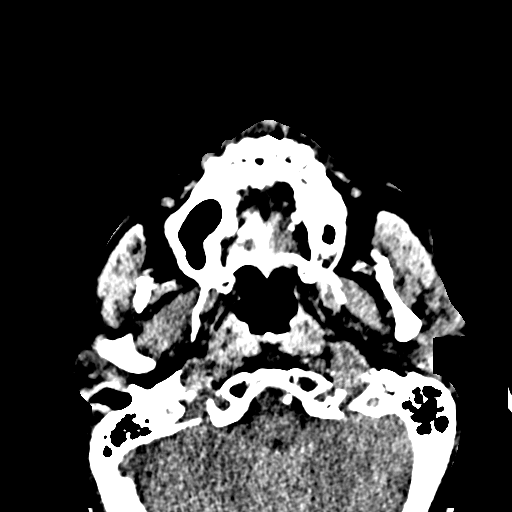
[im 43/96  bone]
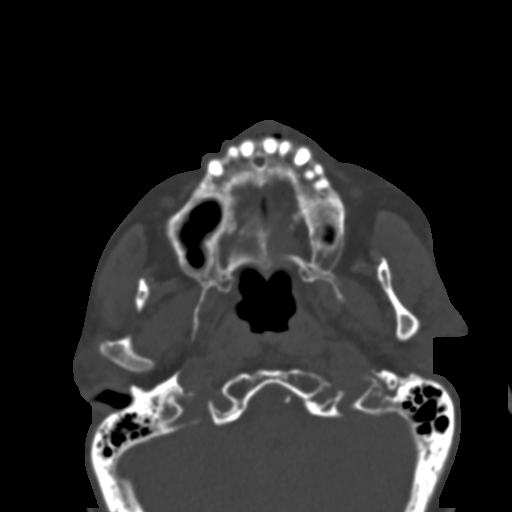
[im 53/96  bone]
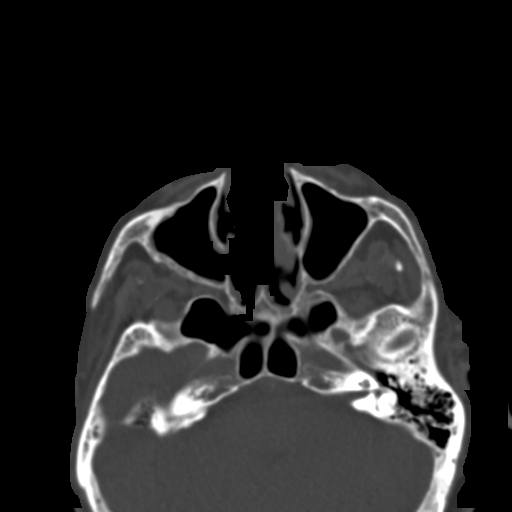
[im 63/96  bone]
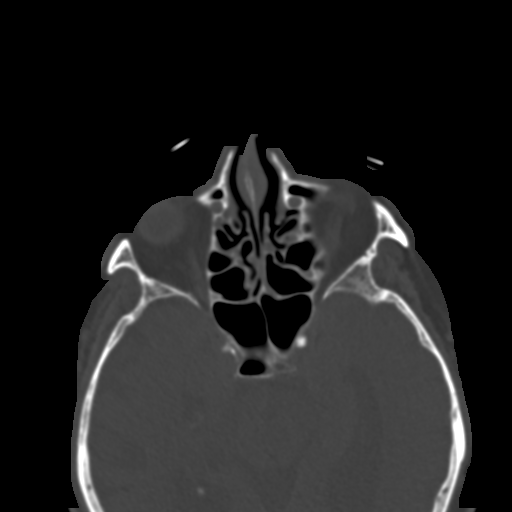
[im 73/96  bone]
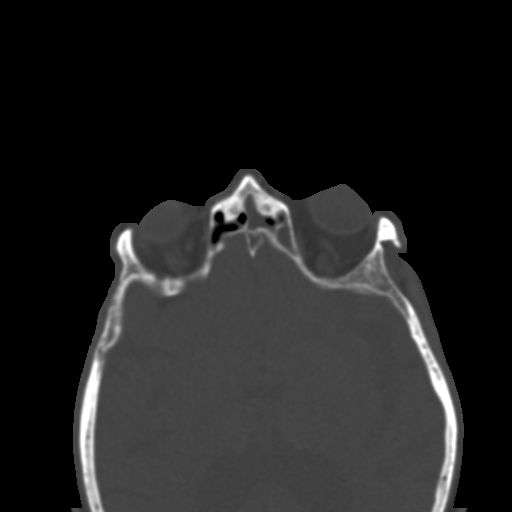
[im 79/96  brain]
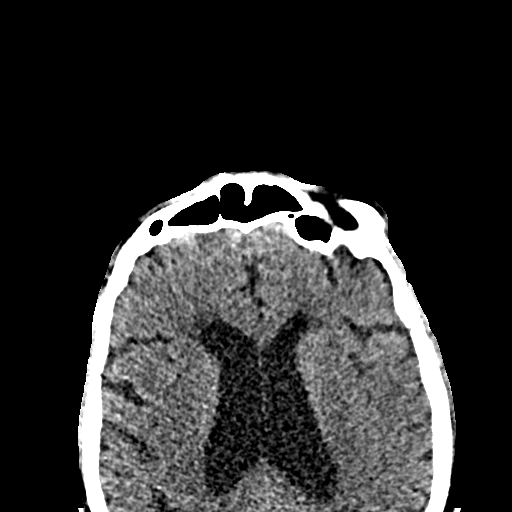
[im 79/96  bone]
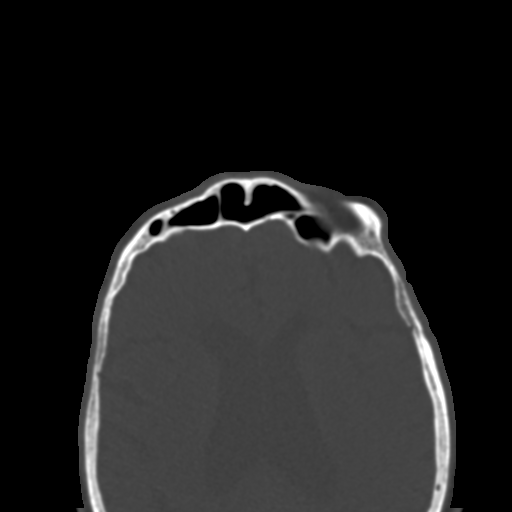
[im 89/96  bone]
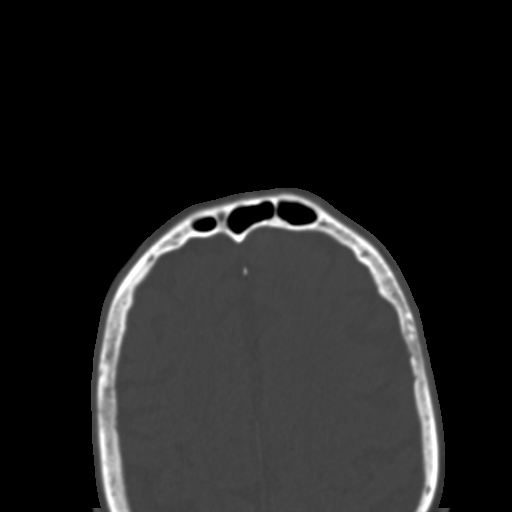

[Series 7: coronal soft · coronal · 0.38mm/px · 3 of 100 slices shown]
[im 34/100  bone]
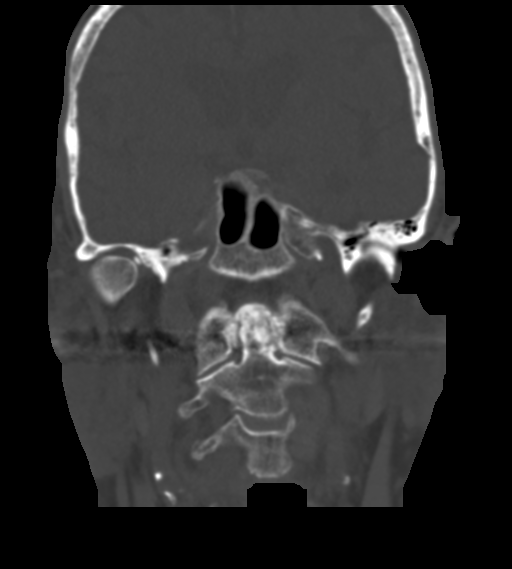
[im 45/100  bone]
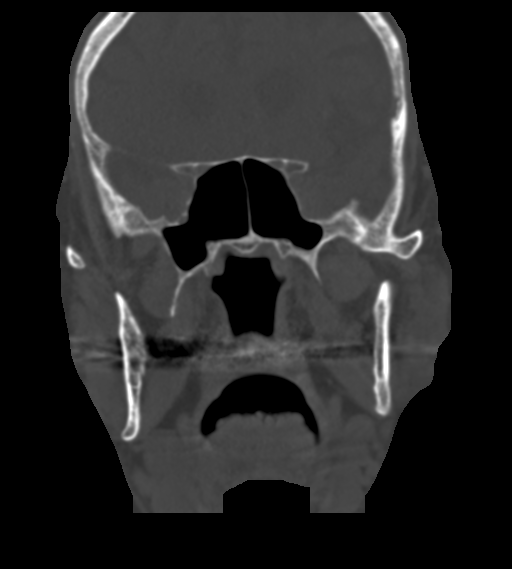
[im 56/100  bone]
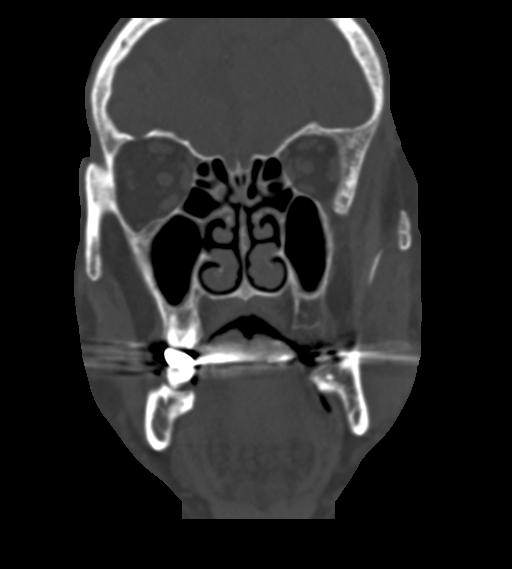

[Series 8: sagittal soft · sagittal · 0.39mm/px · 3 of 93 slices shown]
[im 31/93  bone]
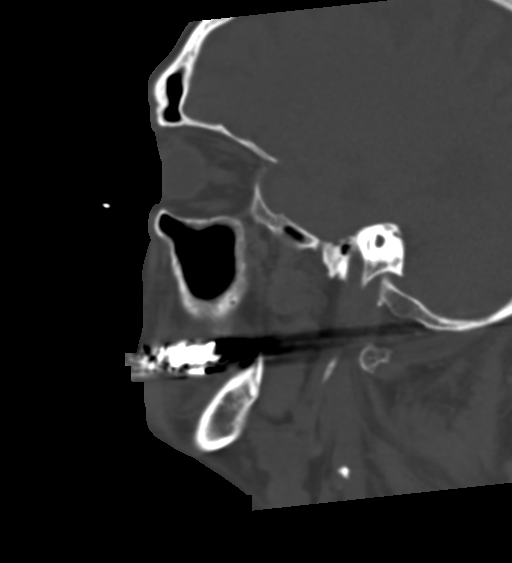
[im 47/93  bone]
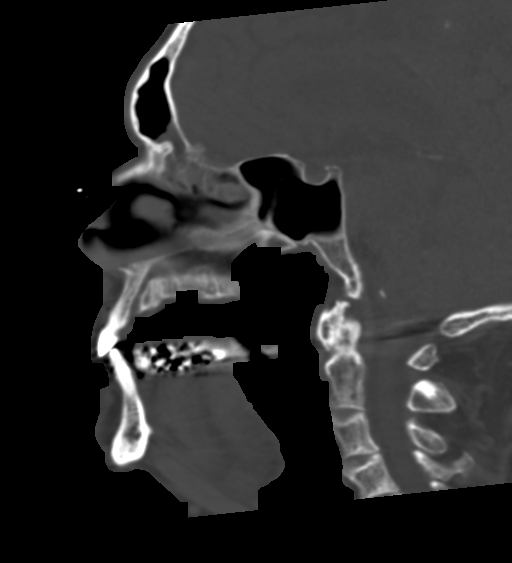
[im 62/93  bone]
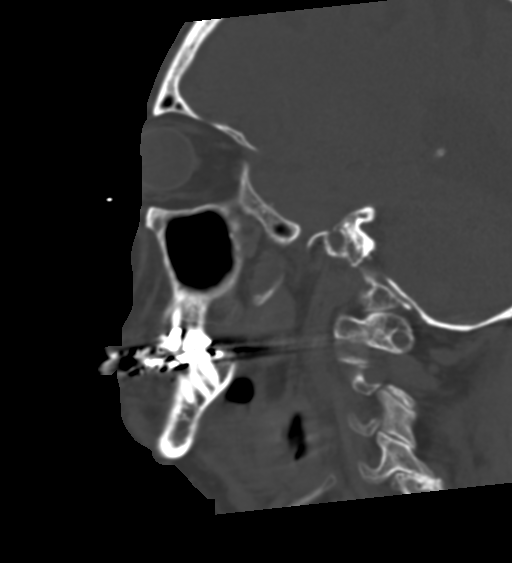

[16 of 47 positions shown; findings below may reference images not displayed]

FINDINGS: Osseous: No notable tooth devitalization or periapical erosion.
Negative for fracture or aggressive bone lesion.

Orbits: Bilateral cataract resection. No acute or inflammatory
finding

Sinuses: Overall mild patchy mucosal thickening which is
generalized. Greatest mucosal thickening in the left frontal
ethmoidal region with frontoethmoidal recess effacement.

Soft tissues: No visible inflammation.  Atheromatous calcification

Limited intracranial: Negative
IMPRESSION: 1. No visible abscess or odontogenic infection.
2. Sinus mucosal thickening with left frontal ethmoidal recess
obstruction.

## 2021-05-08 MED ORDER — AMIODARONE HCL 200 MG PO TABS
200.0000 mg | ORAL_TABLET | Freq: Every day | ORAL | Status: DC
  Administered 2021-05-09 – 2021-05-10 (×2): 200 mg via ORAL
  Filled 2021-05-08 (×2): qty 1

## 2021-05-08 MED ORDER — LABETALOL HCL 5 MG/ML IV SOLN
5.0000 mg | INTRAVENOUS | Status: DC | PRN
  Administered 2021-05-09: 5 mg via INTRAVENOUS
  Filled 2021-05-08: qty 4

## 2021-05-08 MED ORDER — HYDROXYZINE HCL 10 MG PO TABS
10.0000 mg | ORAL_TABLET | Freq: Four times a day (QID) | ORAL | Status: DC | PRN
  Administered 2021-05-09: 10 mg via ORAL
  Filled 2021-05-08 (×2): qty 1

## 2021-05-08 MED ORDER — METOPROLOL TARTRATE 25 MG PO TABS
25.0000 mg | ORAL_TABLET | Freq: Two times a day (BID) | ORAL | Status: DC
  Administered 2021-05-08 – 2021-05-10 (×4): 25 mg via ORAL
  Filled 2021-05-08 (×4): qty 1

## 2021-05-08 MED ORDER — POTASSIUM CHLORIDE CRYS ER 20 MEQ PO TBCR
40.0000 meq | EXTENDED_RELEASE_TABLET | ORAL | Status: AC
  Administered 2021-05-08 (×2): 40 meq via ORAL
  Filled 2021-05-08 (×2): qty 2

## 2021-05-08 MED ORDER — GERHARDT'S BUTT CREAM
TOPICAL_CREAM | Freq: Two times a day (BID) | CUTANEOUS | Status: DC
  Filled 2021-05-08: qty 1

## 2021-05-08 MED ORDER — FOLIC ACID 1 MG PO TABS
1.0000 mg | ORAL_TABLET | Freq: Every day | ORAL | Status: DC
  Administered 2021-05-09 – 2021-05-10 (×2): 1 mg via ORAL
  Filled 2021-05-08 (×2): qty 1

## 2021-05-08 MED ORDER — BUDESONIDE 3 MG PO CPEP
9.0000 mg | ORAL_CAPSULE | Freq: Every day | ORAL | Status: DC
  Administered 2021-05-09 – 2021-05-10 (×2): 9 mg via ORAL
  Filled 2021-05-08 (×2): qty 3

## 2021-05-08 MED ORDER — ASPIRIN EC 325 MG PO TBEC
325.0000 mg | DELAYED_RELEASE_TABLET | ORAL | Status: DC
  Administered 2021-05-09: 325 mg via ORAL
  Filled 2021-05-08: qty 1

## 2021-05-08 MED ORDER — INSULIN ASPART 100 UNIT/ML IJ SOLN
0.0000 [IU] | Freq: Three times a day (TID) | INTRAMUSCULAR | Status: DC
  Administered 2021-05-09 (×3): 5 [IU] via SUBCUTANEOUS

## 2021-05-08 MED ORDER — CRANBERRY 400 MG PO TABS: 400.0000 mg | ORAL_TABLET | Freq: Two times a day (BID) | ORAL | Status: DC

## 2021-05-08 MED ORDER — MAGNESIUM SULFATE 2 GM/50ML IV SOLN
2.0000 g | Freq: Once | INTRAVENOUS | Status: AC
  Administered 2021-05-08: 2 g via INTRAVENOUS
  Filled 2021-05-08: qty 50

## 2021-05-08 MED ORDER — LACTATED RINGERS IV SOLN: INTRAVENOUS | Status: AC

## 2021-05-08 MED ORDER — SODIUM CHLORIDE 0.9% FLUSH: 10.0000 mL | INTRAVENOUS | Status: DC | PRN

## 2021-05-08 NOTE — Consult Note (Signed)
[facility] Nurse wound consult note Consultation was completed by review of records, images and assistance from the bedside nurse/clinical staff.   Reason for Consult:MASD (moisture associated skin damage) Wound type:MASD Pressure Injury POA: NA Measurement:NA Wound UUE:[id number] and skin irritation that is blanchable Drainage (amount, consistency, odor) none Periwound:intact  Dressing procedure/placement/frequency: Add Gerherdt's butt cream for MASD, apply BID and PRN incontinence.    Re consult if needed, will not follow at this time. Thanks  Elainna Eshleman [employer], RN,CWOCN, CNS, [place] [phone])

## 2021-05-08 NOTE — Progress Notes (Signed)
PHARMACIST - PHYSICIAN ORDER COMMUNICATION  CONCERNING: P&T Medication Policy on Herbal Medications  DESCRIPTION:  This patient's order for:  Cranberry  has been noted.  This product(s) is classified as an "herbal" or natural product. Due to a lack of definitive safety studies or FDA approval, nonstandard manufacturing practices, plus the potential risk of unknown drug-drug interactions while on inpatient medications, the Pharmacy and Therapeutics Committee does not permit the use of "herbal" or natural products of this type within [facility].   ACTION TAKEN: The pharmacy department is unable to verify this order at this time and your patient has been informed of this safety policy. Please reevaluate patient's clinical condition at discharge and address if the herbal or natural product(s) should be resumed at that time.   Enedina Pair, PharmD 

## 2021-05-08 NOTE — Progress Notes (Signed)
Patient less confuse,Patient have been off restraint since this AM, continue to remind patient the criteria on keeping restraint off and patient verbalized understanding. Will continue to assess patient.

## 2021-05-08 NOTE — Progress Notes (Signed)
PROGRESS NOTE    Tommy Peterson  KGU:[id number] DOB: 1940/11/10 DOA: 05/06/2021 PCP: [name], DO    Chief Complaint  Patient presents with   Altered Mental Status    Brief Narrative:   Tommy Peterson is a 80 y.o. male with medical history significant of LGL leukemia, A.Fib not on AC, prior GIB, chronic anemia gets frequent transfusions (due to leukemia).   Pt had transfusion for leukemia earlier this week.   Pt recently started on cytoxan for leukemia.   Pt presents to ED with 1 day h/o confusion.  Worsening, now severe with agitation in ED.   Pt does have back pain and headache.  Also has dysuria.  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[place])   DM type 2 (diabetes mellitus, type 2) ([place])   UTI (urinary tract infection)   Persistent atrial fibrillation ([facility])   Acute metabolic encephalopathy  Acute metabolic encephalopathy -sudden mental status changes /new onset of head on Friday per daughter (was acting normal on Thursday) -ct head no acute findings, -s/p LP, ruled out bacterial meningitis, continue acyclovir , awaiting for HSV csf pcr, daughter reports patient c/o headache on friday -improving, likely from uti, daughter reports patient gets confused every time he has UTI  -will hold off on MRI brain , consider mri if confusion persists and patient is more cooperative with exam -blood culture no growth so far, urine culture + klebsiella pneumoniae, cxr no acute findings -resume diet as mental status has improved   Family reports patient had  dental pain on left side recently ct maxillofacial negative for abscess    Hypokalemia/hypomagnesemia Replace, recheck   Insulin dependent dm2, uncontrolled with hyperglycemia Family reports patient is forgetaful. Forget to take him meds A1c 9.8 Adjust insulin   PAF Currently sinus rhythm, Resume amiodarone, betablocker  Was on apixaban but recently taken off   large granular lymphocytic leukemia, transfusion-dependent anemia, immunosuppressed status Getting prbc transfusion about every two weeks, last on 9/29 Was methotraxate , recently changed to cytoxan  FTT Fell on 9/18, per family was accidental fall Fell on right side with right flank pain Cxr no fracture Start pt once mental status improves     Nutritional Assessment:  The patient's BMI is: Body mass index is 22.38 kg/m.Marland Kitchen     Unresulted Labs (From admission, onward)     Start     Ordered   05/08/21 6283  Basic metabolic panel  Daily,   R      05/07/21 1124   05/07/21 1522  Culture, fungus without smear  RELEASE UPON ORDERING,   STAT        05/07/21 1522   05/06/21 2323  HSV 1/2 PCR, CSF Cerebrospinal Fluid  Once,   R        05/06/21 2322              DVT prophylaxis: Place and maintain sequential compression device Start: 05/06/21 2329   Code Status: full ( both daughters are HPOA who stated patient is a  DNR , but patient denies it after he is more awake and less confused around evening time  on 10/2). Will keep full code , will need to verify with patient /family tomorrow Family Communication: daughters at bedside  Disposition:   Dispo: The patient is from: home              Anticipated d/c is to: TBD, pending PT eval              Anticipated d/c date is: >48hrs                Consultants:  Radiology for fluroscope guided LP  Procedures:  LP  Antimicrobials:    Anti-infectives (From admission, onward)    Start     Dose/Rate Route Frequency Ordered Stop   05/08/21 1000  cefTRIAXone (ROCEPHIN) 1 g in sodium chloride 0.9 % 100 mL IVPB  Status:  Discontinued         1 g 200 mL/hr over 30 Minutes Intravenous Every 24 hours 05/07/21 2012 05/07/21 2016   05/08/21 1000  cefTRIAXone (ROCEPHIN) 2 g in sodium chloride 0.9 % 100 mL IVPB        2 g 200 mL/hr over 30 Minutes Intravenous Every 24 hours 05/07/21 2016     05/07/21 2200  cefTRIAXone (ROCEPHIN) 1 g in sodium chloride 0.9 % 100 mL IVPB  Status:  Discontinued        1 g 200 mL/hr over 30 Minutes Intravenous Every 24 hours 05/06/21 2240 05/06/21 2301   05/07/21 1500  vancomycin (VANCOCIN) IVPB 1000 mg/200 mL premix  Status:  Discontinued        1,000 mg 200 mL/hr over 60 Minutes Intravenous Every 12 hours 05/06/21 2351 05/07/21 2013   05/07/21 1000  cefTRIAXone (ROCEPHIN) 2 g in sodium chloride 0.9 % 100 mL IVPB  Status:  Discontinued        2 g 200 mL/hr over 30 Minutes Intravenous Every 12 hours 05/06/21 2301 05/07/21 2012   05/07/21 0000  ampicillin (OMNIPEN) 2 g in sodium chloride 0.9 % 100 mL IVPB  Status:  Discontinued        2 g 300 mL/hr over 20 Minutes Intravenous Every 4 hours 05/06/21 2303 05/07/21 2011   05/06/21 2359  acyclovir (ZOVIRAX) 710 mg in dextrose 5 % 150 mL IVPB        10 mg/kg  70.8 kg 164.2 mL/hr over 60 Minutes Intravenous Every 8 hours 05/06/21 2310     05/06/21 2315  cefTRIAXone (ROCEPHIN) 1 g in sodium chloride 0.9 % 100 mL IVPB        1 g 200 mL/hr over 30 Minutes Intravenous  Once 05/06/21 2301 05/07/21 0218   05/06/21 2315  vancomycin (VANCOREADY) IVPB 1500 mg/300 mL        1,500 mg 150 mL/hr over 120 Minutes Intravenous STAT 05/06/21 2308 05/07/21 0524   05/06/21 1930  cefTRIAXone (ROCEPHIN) 1 g in sodium chloride 0.9 % 100 mL IVPB        1 g 200 mL/hr over 30 Minutes Intravenous  Once 05/06/21 1917 05/06/21 2223          Objective: Vitals:   05/07/21 2300 05/08/21 0000 05/08/21 0114 05/08/21 1020  BP:  (!) 146/54 (!) 164/70 (!) 133/46  Pulse: 100 93 98 97  Resp: (!) 37 19 18 20   Temp:   98.9 F (37.2 C) 97.7 F (36.5 C)  TempSrc:   Oral Oral   SpO2: 91% 94% 97% 91%  Weight:      Height:        Intake/Output Summary (  Last 24 hours) at 05/08/2021 1114 Last data filed at 05/08/2021 1100 Gross per 24 hour  Intake 2629.73 ml  Output 2400 ml  Net 229.73 ml   Filed Weights   05/06/21 1650  Weight: 70.8 kg    Examination:  General exam: more alert, less confused, currently no agitation Respiratory system: Clear to auscultation. Respiratory effort normal. Cardiovascular system: S1 & S2 heard, RRR.  No pedal edema. Gastrointestinal system: Abdomen is nondistended, soft and nontender. Normal bowel sounds heard. Central nervous system: more alert, less confused, follow commands. Extremities: No edema Skin: No rashes, lesions or ulcers Psychiatry: currently no agitation    Data Reviewed: I have personally reviewed following labs and imaging studies  CBC: Recent Labs  Lab 05/04/21 0944 05/06/21 1818 05/07/21 0317 05/08/21 0652  WBC 12.9* 28.1* 22.4* 14.0*  NEUTROABS 6.5 18.2*  --  7.5  HGB 6.2* 9.5* 8.5* 7.5*  HCT 18.7* 28.0* 24.5* 22.6*  MCV 88.2 92.4 92.1 89.7  PLT 240 255 231 242    Basic Metabolic Panel: Recent Labs  Lab 05/04/21 0944 05/06/21 1818 05/07/21 0317 05/08/21 0652  NA 136 134* 133* 137  K 4.0 4.0 3.4* 2.7*  CL 102 96* 98 101  CO2 25 28 27 28   GLUCOSE 294* 299* 229* 147*  BUN 19 18 10  6*  CREATININE 0.70 0.63 0.44* 0.38*  CALCIUM 9.0 9.2 8.6* 8.4*  MG  --   --   --  1.7    GFR: Estimated Creatinine Clearance: 72.5 mL/min (A) (by C-G formula based on SCr of 0.38 mg/dL (L)).  Liver Function Tests: Recent Labs  Lab 05/04/21 0944 05/06/21 1818 05/07/21 0317  AST 14* 22 19  ALT 45* 52* 43  ALKPHOS 87 94 83  BILITOT 1.0 1.2 0.9  PROT 5.9* 6.9 6.2*  ALBUMIN 3.0* 3.6 3.1*    CBG: Recent Labs  Lab 05/07/21 1531 05/07/21 2054 05/07/21 2304 05/08/21 0403 05/08/21 0715  GLUCAP 102* 159* 183* 121* 139*     Recent Results (from the past 240 hour(s))  Urine Culture      Status: Abnormal (Preliminary result)   Collection Time: 05/06/21  6:08 PM   Specimen: Urine, Clean Catch  Result Value Ref Range Status   Specimen Description   Final    URINE, CLEAN CATCH Performed at [facility], [place] [address], [place], [place] [address]    Special Requests   Final    NONE Performed at [facility], [place] [address], [place], [place] [address]    Culture (A)  Final    >=100,000 COLONIES/mL GRAM NEGATIVE RODS SUSCEPTIBILITIES TO FOLLOW Performed at [facility], [place] [address], [place], [place] [address]    Report Status PENDING  Incomplete  Culture, blood (routine x 2)     Status: None (Preliminary result)   Collection Time: 05/06/21  8:45 PM   Specimen: BLOOD  Result Value Ref Range Status   Specimen Description   Final    BLOOD LEFT Performed at [place] [address], [place], [place] [address]    Special Requests   Final    BOTTLES DRAWN AEROBIC AND ANAEROBIC Blood Culture adequate volume Performed at [place] [address], [place], [place] [address]    Culture   Final    NO GROWTH 2 DAYS Performed at [place] [address], [place], [place] [address]    Report Status PENDING  Incomplete  Culture,  blood (routine x 2)     Status: None (Preliminary result)   Collection Time: 05/06/21  9:05 PM   Specimen: BLOOD  Result Value Ref Range Status   Specimen Description   Final    BLOOD UPPER BLOOD LEFT ARM Performed at [place] [address] [place], [place] [address]    Special Requests   Final    BOTTLES DRAWN AEROBIC AND ANAEROBIC Blood Culture adequate volume Performed at [place] [address] [place], [place] [address]    Culture   Final    NO GROWTH 2 DAYS Performed at [place] [address], [place], [place] [address]    Report Status PENDING  Incomplete  MRSA Next Gen by  PCR, Nasal     Status: None   Collection Time: 05/07/21  3:11 AM   Specimen: Nasal Mucosa; Nasal Swab  Result Value Ref Range Status   MRSA by PCR Next Gen NOT DETECTED NOT DETECTED Final    Comment: (NOTE) The GeneXpert MRSA Assay (FDA approved for NASAL specimens only), is one component of a comprehensive MRSA colonization surveillance program. It is not intended to diagnose MRSA infection nor to guide or monitor treatment for MRSA infections. Test performance is not FDA approved in patients less than 65 years old. Performed at [facility], [place] [address], [place], [place] [address]   Resp Panel by RT-PCR (Flu A&B, Covid) Nasopharyngeal Swab     Status: None   Collection Time: 05/07/21 12:05 PM   Specimen: Nasopharyngeal Swab; Nasopharyngeal(NP) swabs in vial transport medium  Result Value Ref Range Status   SARS Coronavirus 2 by RT PCR NEGATIVE NEGATIVE Final    Comment: (NOTE) SARS-CoV-2 target nucleic acids are NOT DETECTED.  The SARS-CoV-2 RNA is generally detectable in upper respiratory specimens during the acute phase of infection. The lowest concentration of SARS-CoV-2 viral copies this assay can detect is 138 copies/mL. A negative result does not preclude SARS-Cov-2 infection and should not be used as the sole basis for treatment or other patient management decisions. A negative result may occur with  improper specimen collection/handling, submission of specimen other than nasopharyngeal swab, presence of viral mutation(s) within the areas targeted by this assay, and inadequate number of viral copies(<138 copies/mL). A negative result must be combined with clinical observations, patient history, and epidemiological information. The expected result is Negative.  Fact Sheet for Patients:  EntrepreneurPulse.com.au  Fact Sheet for Healthcare Providers:  IncredibleEmployment.be  This test is no t yet approved or  cleared by the [place] FDA and  has been authorized for detection and/or diagnosis of SARS-CoV-2 by FDA under an Emergency Use Authorization (EUA). This EUA will remain  in effect (meaning this test can be used) for the duration of the COVID-19 declaration under Section 564(b)(1) of the Act, 21 U.S.C.section 360bbb-3(b)(1), unless the authorization is terminated  or revoked sooner.       Influenza A by PCR NEGATIVE NEGATIVE Final   Influenza B by PCR NEGATIVE NEGATIVE Final    Comment: (NOTE) The Xpert Xpress SARS-CoV-2/FLU/RSV plus assay is intended as an aid in the diagnosis of influenza from Nasopharyngeal swab specimens and should not be used as a sole basis for treatment. Nasal washings and aspirates are unacceptable for Xpert Xpress SARS-CoV-2/FLU/RSV testing.  Fact Sheet for Patients: EntrepreneurPulse.com.au  Fact Sheet for Healthcare Providers: IncredibleEmployment.be  This test is not yet approved or cleared by the [place] FDA and has  been authorized for detection and/or diagnosis of SARS-CoV-2 by FDA under an Emergency Use Authorization (EUA). This EUA will remain in effect (meaning this test can be used) for the duration of the COVID-19 declaration under Section 564(b)(1) of the Act, 21 U.S.C. section 360bbb-3(b)(1), unless the authorization is terminated or revoked.  Performed at [facility], [place] [address] [place], [place] [address]   CSF culture w Gram Stain     Status: None (Preliminary result)   Collection Time: 05/07/21  2:56 PM   Specimen: CSF; Cerebrospinal Fluid  Result Value Ref Range Status   Specimen Description   Final    CSF Performed at [place] [address], [place], [place] [address]    Special Requests   Final    NONE Performed at [facility], [place] [address] [place], [place] [address]    Gram Stain   Final    NO WBC SEEN NO  ORGANISMS SEEN CYTOSPIN SMEAR Gram Stain Report Called to,Read Back By and Verified With: HEAVENER,A RN @1622  ON 05/07/21 BY GOLSONM Performed at [facility], [place] [address] [place], [place] [address]    Culture   Final    NO GROWTH < 24 HOURS Performed at [place] [address] [place], [place] [address]    Report Status PENDING  Incomplete         Radiology Studies: CT HEAD WO CONTRAST (5MM)  Result Date: 05/06/2021 CLINICAL DATA:  Confusion, altered level of consciousness EXAM: CT HEAD WITHOUT CONTRAST TECHNIQUE: Contiguous axial images were obtained from the base of the skull through the vertex without intravenous contrast. COMPARISON:  01/21/2021 FINDINGS: Brain: No acute infarct or hemorrhage. Lateral ventricles and midline structures are unremarkable. No acute extra-axial fluid collections. No mass effect. Vascular: No hyperdense vessel or unexpected calcification. Skull: Normal. Negative for fracture or focal lesion. Sinuses/Orbits: No acute finding. Other: None. IMPRESSION: 1. Stable head CT, no acute intracranial process. Electronically Signed   By: Randa Ngo M.D.   On: 05/06/2021 19:01   DG Chest Portable 1 View  Result Date: 05/06/2021 CLINICAL DATA:  Altered level of consciousness, confusion EXAM: PORTABLE CHEST 1 VIEW COMPARISON:  04/03/2021 FINDINGS: Single frontal view of the chest demonstrates a stable cardiac silhouette. There is chronic central vascular congestion and interstitial prominence, without focal airspace disease, effusion, or pneumothorax. No acute bony abnormalities. IMPRESSION: 1. Stable chest, no acute process. Electronically Signed   By: Randa Ngo M.D.   On: 05/06/2021 18:50   CT MAXILLOFACIAL WO CONTRAST  Result Date: 05/08/2021 CLINICAL DATA:  Maxillofacial dental pain, question abscess EXAM: CT MAXILLOFACIAL WITHOUT CONTRAST TECHNIQUE: Multidetector CT imaging of the maxillofacial structures was performed.  Multiplanar CT image reconstructions were also generated. COMPARISON:  Head CT from 2 days ago FINDINGS: Osseous: No notable tooth devitalization or periapical erosion. Negative for fracture or aggressive bone lesion. Orbits: Bilateral cataract resection. No acute or inflammatory finding Sinuses: Overall mild patchy mucosal thickening which is generalized. Greatest mucosal thickening in the left frontal ethmoidal region with frontoethmoidal recess effacement. Soft tissues: No visible inflammation.  Atheromatous calcification Limited intracranial: Negative IMPRESSION: 1. No visible abscess or odontogenic infection. 2. Sinus mucosal thickening with left frontal ethmoidal recess obstruction. Electronically Signed   By: Jorje Guild M.D.   On: 05/08/2021 07:00   DG FLUORO GUIDE LUMBAR PUNCTURE  Result Date: 05/07/2021 Lovey Newcomer, MD     05/07/2021  3:34 PM CLINICAL DATA: [Patient for  diagnostic lumbar puncture.] EXAM: DIAGNOSTIC LUMBAR PUNCTURE UNDER FLUOROSCOPIC GUIDANCE COMPARISON: [[facility]] FLUOROSCOPY TIME: Fluoroscopy Time:  [30 seconds] Radiation Exposure Index (if provided by the fluoroscopic device):  [6.5 mGy] Number of Acquired Spot Images: [5] PROCEDURE: Informed consent was obtained from the patient prior to the procedure, including potential complications of headache, allergy, and pain.  With the patient prone, the lower back was prepped with Betadine.  1% Lidocaine was used for local anesthesia.  Lumbar puncture was performed at the [L4-5] level using a [20] gauge needle with return of [clear] CSF.  [7] ml of CSF were obtained for laboratory studies.  The patient tolerated the procedure well and there were no apparent complications. IMPRESSION: [Successful lumbar puncture.]       Scheduled Meds:  Chlorhexidine Gluconate Cloth  6 each Topical Q0600   insulin aspart  0-9 Units Subcutaneous Q4H   potassium chloride  40 mEq Oral Q4H   sodium chloride flush  10-40 mL Intracatheter Q12H    Continuous Infusions:  acyclovir (ZOVIRAX) [phone] mg IVPB 710 mg (05/08/21 0422)   cefTRIAXone (ROCEPHIN)  IV 2 g (05/08/21 1014)   lactated ringers 125 mL/hr at 05/08/21 0537   magnesium sulfate bolus IVPB       LOS: 1 day   Time spent: 52mins Greater than 50% of this time was spent in counseling, explanation of diagnosis, planning of further management, and coordination of care.   Voice Recognition Viviann Spare dictation system was used to create this note, attempts have been made to correct errors. Please contact the author with questions and/or clarifications.   Florencia Reasons, MD PhD FACP [facility]  Available via Epic secure chat 7am-7pm for nonurgent issues Please page for urgent issues To page the attending provider between 7A-7P or the covering provider during after hours 7P-7A, please log into the web site [email or web address] and access using universal [place] password for that web site. If you do not have the password, please call the hospital operator.    05/08/2021, 11:14 AM

## 2021-05-09 DIAGNOSIS — G9341 Metabolic encephalopathy: Principal | ICD-10-CM

## 2021-05-09 DIAGNOSIS — C91Z0 Other lymphoid leukemia not having achieved remission: Secondary | ICD-10-CM

## 2021-05-09 DIAGNOSIS — Z794 Long term (current) use of insulin: Secondary | ICD-10-CM

## 2021-05-09 DIAGNOSIS — I4819 Other persistent atrial fibrillation: Secondary | ICD-10-CM

## 2021-05-09 DIAGNOSIS — N3000 Acute cystitis without hematuria: Secondary | ICD-10-CM

## 2021-05-09 DIAGNOSIS — E119 Type 2 diabetes mellitus without complications: Secondary | ICD-10-CM

## 2021-05-09 DIAGNOSIS — D649 Anemia, unspecified: Secondary | ICD-10-CM

## 2021-05-09 LAB — URINE CULTURE: Culture: >=100000 — AB

## 2021-05-09 LAB — GLUCOSE, CAPILLARY
Glucose-Capillary: 176 mg/dL — ABNORMAL HIGH (ref 70–99)
Glucose-Capillary: 181 mg/dL — ABNORMAL HIGH (ref 70–99)
Glucose-Capillary: 205 mg/dL — ABNORMAL HIGH (ref 70–99)
Glucose-Capillary: 221 mg/dL — ABNORMAL HIGH (ref 70–99)
Glucose-Capillary: 241 mg/dL — ABNORMAL HIGH (ref 70–99)
Glucose-Capillary: 335 mg/dL — ABNORMAL HIGH (ref 70–99)

## 2021-05-09 LAB — BASIC METABOLIC PANEL
Anion gap: 7 (ref 5–15)
BUN: 7 mg/dL — ABNORMAL LOW (ref 8–23)
CO2: 29 mmol/L (ref 22–32)
Calcium: 8.6 mg/dL — ABNORMAL LOW (ref 8.9–10.3)
Chloride: 102 mmol/L (ref 98–111)
Creatinine, Ser: 0.46 mg/dL — ABNORMAL LOW (ref 0.61–1.24)
GFR, Estimated: >60 mL/min (ref >60)
Glucose, Bld: 278 mg/dL — ABNORMAL HIGH (ref 70–99)
Potassium: 3.4 mmol/L — ABNORMAL LOW (ref 3.5–5.1)
Sodium: 138 mmol/L (ref 135–145)

## 2021-05-09 LAB — CBC
HCT: 26.5 % — ABNORMAL LOW (ref 39.0–52.0)
Hemoglobin: 8.8 g/dL — ABNORMAL LOW (ref 13.0–17.0)
MCH: 30 pg (ref 26.0–34.0)
MCHC: 33.2 g/dL (ref 30.0–36.0)
MCV: 90.4 fL (ref 80.0–100.0)
Platelets: 208 10*3/uL (ref 150–400)
RBC: 2.93 MIL/uL — ABNORMAL LOW (ref 4.22–5.81)
RDW: 14.9 % (ref 11.5–15.5)
WBC: 10.2 10*3/uL (ref 4.0–10.5)
nRBC: 0 % (ref 0.0–0.2)

## 2021-05-09 LAB — SURGICAL PATHOLOGY

## 2021-05-09 LAB — MAGNESIUM: Magnesium: 1.9 mg/dL (ref 1.7–2.4)

## 2021-05-09 MED ORDER — POTASSIUM CHLORIDE CRYS ER 20 MEQ PO TBCR
40.0000 meq | EXTENDED_RELEASE_TABLET | Freq: Once | ORAL | Status: AC
  Administered 2021-05-09: 40 meq via ORAL
  Filled 2021-05-09: qty 2

## 2021-05-09 MED ORDER — MAGNESIUM OXIDE -MG SUPPLEMENT 400 (240 MG) MG PO TABS
400.0000 mg | ORAL_TABLET | Freq: Two times a day (BID) | ORAL | Status: DC
  Administered 2021-05-09 – 2021-05-10 (×2): 400 mg via ORAL
  Filled 2021-05-09 (×2): qty 1

## 2021-05-09 MED ORDER — INSULIN ASPART 100 UNIT/ML IJ SOLN
0.0000 [IU] | Freq: Three times a day (TID) | INTRAMUSCULAR | Status: DC
  Administered 2021-05-10: 3 [IU] via SUBCUTANEOUS

## 2021-05-09 MED ORDER — INSULIN GLARGINE-YFGN 100 UNIT/ML ~~LOC~~ SOLN
10.0000 [IU] | Freq: Every day | SUBCUTANEOUS | Status: DC
  Administered 2021-05-09 – 2021-05-10 (×2): 10 [IU] via SUBCUTANEOUS
  Filled 2021-05-09 (×2): qty 0.1

## 2021-05-09 MED ORDER — INSULIN ASPART 100 UNIT/ML IJ SOLN
0.0000 [IU] | Freq: Every day | INTRAMUSCULAR | Status: DC
  Administered 2021-05-09: 4 [IU] via SUBCUTANEOUS

## 2021-05-09 NOTE — Evaluation (Signed)
Physical Therapy Evaluation Patient Details Name: Tommy Peterson MRN: [id number] DOB: 1941-04-27 Today's Date: 05/09/2021  History of Present Illness  80 year old male presented to the hospital with confusion and agitation with back pain and headache and admitted 05/06/21 for acute metabolic encephalopathy.  PMHx: T-cell large granular lymphocytic leukemia, chronic anemia blood transfusion dependent, MGUS, history of C. difficile, collagenous colitis/chronic diarrhea, PAF-previously on Eliquis, chronic diastolic CHF, HTN, W4RX  Clinical Impression  Pt admitted with above diagnosis. Pt currently with functional limitations due to the deficits listed below (see PT Problem List). Pt will benefit from skilled PT to increase their independence and safety with mobility to allow discharge to the venue listed below.  Pt assisted with ambulating in hallway and utilized RW for more support today.  Pt reports he typically doesn't use assistive device at home. Pt reports living with "spouse" and 2 of his children however RN reports pt lives alone.  Recommend initial supervision with mobility upon d/c.      Recommendations for follow up therapy are one component of a multi-disciplinary discharge planning process, led by the attending physician.  Recommendations may be updated based on patient status, additional functional criteria and insurance authorization.  Follow Up Recommendations Home health PT;Supervision for mobility/OOB    Equipment Recommendations  None recommended by PT    Recommendations for Other Services       Precautions / Restrictions Precautions Precautions: Fall      Mobility  Bed Mobility Overal bed mobility: Needs Assistance Bed Mobility: Supine to Sit     Supine to sit: Min assist;Min guard;HOB elevated     General bed mobility comments: mild assist for trunk upright    Transfers Overall transfer level: Needs assistance Equipment used: Rolling walker (2  wheeled) Transfers: Sit to/from Stand Sit to Stand: Min guard         General transfer comment: cues for hand placement  Ambulation/Gait Ambulation/Gait assistance: Min guard Gait Distance (Feet): 300 Feet Assistive device: Rolling walker (2 wheeled) Gait Pattern/deviations: Step-through pattern;Decreased stride length;Drifts right/left     General Gait Details: cues for RW positioning, denies any symptoms  Stairs            Wheelchair Mobility    Modified Rankin (Stroke Patients Only)       Balance Overall balance assessment: Mild deficits observed, not formally tested                                           Pertinent Vitals/Pain Pain Assessment: No/denies pain    Home Living Family/patient expects to be discharged to:: Private residence Living Arrangements: Alone Available Help at Discharge: Family;Available 24 hours/day Type of Home: House Home Access: Stairs to enter Entrance Stairs-Rails: Right Entrance Stairs-Number of Steps: 3 and 6 (his typical way) Home Layout: Able to live on main level with bedroom/bathroom;Laundry or work area in [place]: Environmental consultant - 2 wheels;Grab bars - toilet;Grab bars - tub/shower Additional Comments: pt reports he lives with significant other and 2 of his children; RN reports per one of his daughters that pt lives alone, significant other also has health issues and her own place to stay    Prior Function Level of Independence: Independent               Hand Dominance        Extremity/Trunk Assessment  Lower Extremity Assessment Lower Extremity Assessment: Generalized weakness    Cervical / Trunk Assessment Cervical / Trunk Assessment: Normal  Communication   Communication: HOH  Cognition Arousal/Alertness: Awake/alert Behavior During Therapy: WFL for tasks assessed/performed Overall Cognitive Status: No family/caregiver present to determine baseline cognitive  functioning                                 General Comments: pt awake and orientated to place and person; uncertain of accuracy of help at home      General Comments      Exercises     Assessment/Plan    PT Assessment Patient needs continued PT services  PT Problem List Decreased strength;Decreased mobility;Decreased balance;Decreased knowledge of use of DME;Decreased cognition       PT Treatment Interventions Gait training;Therapeutic exercise;DME instruction;Balance training;Functional mobility training;Therapeutic activities;Patient/family education    PT Goals (Current goals can be found in the Care Plan section)  Acute Rehab PT Goals PT Goal Formulation: With patient Time For Goal Achievement: 05/23/21 Potential to Achieve Goals: Good    Frequency Min 3X/week   Barriers to discharge        Co-evaluation               AM-PAC PT "6 Clicks" Mobility  Outcome Measure Help needed turning from your back to your side while in a flat bed without using bedrails?: A Little Help needed moving from lying on your back to sitting on the side of a flat bed without using bedrails?: A Little Help needed moving to and from a bed to a chair (including a wheelchair)?: A Little Help needed standing up from a chair using your arms (e.g., wheelchair or bedside chair)?: A Little Help needed to walk in hospital room?: A Little Help needed climbing 3-5 steps with a railing? : A Little 6 Click Score: 18    End of Session Equipment Utilized During Treatment: Gait belt Activity Tolerance: Patient tolerated treatment well Patient left: in chair;with call bell/phone within reach;with chair alarm set Nurse Communication: Mobility status PT Visit Diagnosis: Difficulty in walking, not elsewhere classified (R26.2)    Time: 7793-9030 PT Time Calculation (min) (ACUTE ONLY): 14 min   Charges:   PT Evaluation $PT Eval Low Complexity: 1 Low        Kati PT, DPT Acute  Rehabilitation Services Pager: [phone] Office: [place] 05/09/2021, 4:20 PM

## 2021-05-09 NOTE — Progress Notes (Addendum)
PROGRESS NOTE    Tommy Peterson  XTK:[id number] DOB: February 04, 1941 DOA: 05/06/2021 PCP: [name], DO   Brief Narrative:   Tommy Peterson is a 80 y.o. male with past medical history of leukemia, atrial fibrillation not on anticoagulation, history of GI bleed in the past, chronic anemia with frequent transfusions presented to the hospital with confusion and agitation with back pain and headache.  Patient was then admitted to the  hospital for further evaluation and treatment.  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  Status post lumbar puncture without bacterial meningitis.  Currently on acyclovir.  Awaiting HSV PCR.  Plan to consider MRI if confusion worsens.  Blood cultures without any growth.  Urine culture with Klebsiella -already on IV Rocephin.  Chest x-ray no acute findings.  Patient complained of dental pain but CT maxilla negative for abscess.    Hypokalemia/hypomagnesemia Improved.  Continue to replenish potassium.  Magnesium level of 1.9.  Klebsiella UTI.  On IV Rocephin.  Diabetes mellitus type 2 hyperglycemia Latest hemoglobin A1c of 9.8.  Patient has trouble remembering to take medication  Paroxysmal atrial fibrillation. Continue amiodarone, beta-blockers.  Was on apixaban which was recently taken off.  Currently in sinus rhythm.   Large granular lymphocytic leukemia, transfusion-dependent anemia, immunosuppressed status, Patient takes PRBC transfusion every 2 weeks or so. Last one on 05/05/2021 was on methotrexate as outpatient.  Has been changed to Cytoxan recently by oncology.   DVT prophylaxis: Place and maintain sequential compression device Start:  05/06/21 2329   Code Status:  Full code   Family Communication:  I tried to call the patient's daughter but was unable to reach today  Disposition:   Dispo: The patient is from: home              Anticipated d/c is to: PT evaluation pending.  Continue electrolyte monitoring and replacement as necessary.              Anticipated d/c date is: Uncertain.               Consultants:  Radiology  Procedures:  Fluoroscopy guided lumbar puncture   Antimicrobials:   Acyclovir Anti-infectives (From admission, onward)    Start     Dose/Rate Route Frequency Ordered Stop   05/08/21 1000  cefTRIAXone (ROCEPHIN) 1 g in sodium chloride 0.9 % 100 mL IVPB  Status:  Discontinued        1 g 200 mL/hr over 30 Minutes Intravenous Every 24 hours 05/07/21 2012 05/07/21 2016   05/08/21 1000  cefTRIAXone (ROCEPHIN) 2 g in sodium chloride 0.9 % 100 mL IVPB        2 g 200 mL/hr over 30 Minutes Intravenous Every 24 hours 05/07/21 2016     05/07/21 2200  cefTRIAXone (ROCEPHIN) 1 g in sodium chloride 0.9 % 100 mL IVPB  Status:  Discontinued        1 g 200 mL/hr over 30 Minutes Intravenous Every 24 hours 05/06/21 2240 05/06/21 2301   05/07/21 1500  vancomycin (VANCOCIN) IVPB 1000 mg/200 mL premix  Status:  Discontinued        1,000 mg 200 mL/hr over 60 Minutes Intravenous Every 12 hours 05/06/21  2351 05/07/21 2013   05/07/21 1000  cefTRIAXone (ROCEPHIN) 2 g in sodium chloride 0.9 % 100 mL IVPB  Status:  Discontinued        2 g 200 mL/hr over 30 Minutes Intravenous Every 12 hours 05/06/21 2301 05/07/21 2012   05/07/21 0000  ampicillin (OMNIPEN) 2 g in sodium chloride 0.9 % 100 mL IVPB  Status:  Discontinued        2 g 300 mL/hr over 20 Minutes Intravenous Every 4 hours 05/06/21 2303 05/07/21 2011   05/06/21 2359  acyclovir (ZOVIRAX) 710 mg in dextrose 5 % 150 mL IVPB        10 mg/kg  70.8 kg 164.2 mL/hr over 60 Minutes Intravenous Every 8 hours 05/06/21 2310     05/06/21 2315  cefTRIAXone (ROCEPHIN) 1  g in sodium chloride 0.9 % 100 mL IVPB        1 g 200 mL/hr over 30 Minutes Intravenous  Once 05/06/21 2301 05/07/21 0218   05/06/21 2315  vancomycin (VANCOREADY) IVPB 1500 mg/300 mL        1,500 mg 150 mL/hr over 120 Minutes Intravenous STAT 05/06/21 2308 05/07/21 0524   05/06/21 1930  cefTRIAXone (ROCEPHIN) 1 g in sodium chloride 0.9 % 100 mL IVPB        1 g 200 mL/hr over 30 Minutes Intravenous  Once 05/06/21 1917 05/06/21 2223          Objective: Vitals:   05/08/21 1359 05/08/21 2020 05/09/21 0418 05/09/21 0918  BP: (!) 156/69 (!) 168/76 (!) 189/92 (!) 166/78  Pulse: 91 95 97 (!) 101  Resp:  20 18   Temp:  98.2 F (36.8 C) 97.9 F (36.6 C)   TempSrc:      SpO2:   97%   Weight:      Height:        Intake/Output Summary (Last 24 hours) at 05/09/2021 1113 Last data filed at 05/09/2021 0959 Gross per 24 hour  Intake 3255.2 ml  Output 3150 ml  Net 105.2 ml    Filed Weights   05/06/21 1650  Weight: 70.8 kg   Subjective:  Today, patient is alert awake complains of weakness.  Has had 2 stools yesterday.  Denies any headache, nausea or vomiting.  Physical examination:  General:  Average built, not in obvious distress, communicative, HENT:   No scleral pallor or icterus noted. Oral mucosa is moist.  Chest:  Clear breath sounds.  Diminished breath sounds bilaterally. No crackles or wheezes.  CVS: S1 &S2 heard. No murmur.  Regular rate and rhythm. Abdomen: Soft, nontender, nondistended.  Bowel sounds are heard.   Extremities: No cyanosis, clubbing or edema.  Peripheral pulses are palpable. Psych: Alert, awake and negative normal mood CNS:  No cranial nerve deficits.  Power equal in all extremities.   Skin: Warm and dry.  No rashes noted.   Data Reviewed: I have personally reviewed the following labs and imaging studies  CBC: Recent Labs  Lab 05/04/21 0944 05/06/21 1818 05/07/21 0317 05/08/21 0652  WBC 12.9* 28.1* 22.4* 14.0*  NEUTROABS 6.5 18.2*  --  7.5   HGB 6.2* 9.5* 8.5* 7.5*  HCT 18.7* 28.0* 24.5* 22.6*  MCV 88.2 92.4 92.1 89.7  PLT 240 255 231 204     Basic Metabolic Panel: Recent Labs  Lab 05/04/21 0944 05/06/21 1818 05/07/21 0317 05/08/21 0652  NA 136 134* 133* 137  K 4.0 4.0 3.4* 2.7*  CL 102 96* 98 101  CO2 25  28 27 28   GLUCOSE 294* 299* 229* 147*  BUN 19 18 10  6*  CREATININE 0.70 0.63 0.44* 0.38*  CALCIUM 9.0 9.2 8.6* 8.4*  MG  --   --   --  1.7     GFR: Estimated Creatinine Clearance: 72.5 mL/min (A) (by C-G formula based on SCr of 0.38 mg/dL (L)).  Liver Function Tests: Recent Labs  Lab 05/04/21 0944 05/06/21 1818 05/07/21 0317  AST 14* 22 19  ALT 45* 52* 43  ALKPHOS 87 94 83  BILITOT 1.0 1.2 0.9  PROT 5.9* 6.9 6.2*  ALBUMIN 3.0* 3.6 3.1*     CBG: Recent Labs  Lab 05/08/21 1558 05/08/21 2016 05/09/21 0017 05/09/21 0414 05/09/21 0723  GLUCAP 191* 285* 181* 176* 205*      Recent Results (from the past 240 hour(s))  Urine Culture     Status: Abnormal   Collection Time: 05/06/21  6:08 PM   Specimen: Urine, Clean Catch  Result Value Ref Range Status   Specimen Description   Final    URINE, CLEAN CATCH Performed at [facility], [place] [address] [place], [place] [address]    Special Requests   Final    NONE Performed at [facility], [place] [address], [place], [place] [address]    Culture >=100,000 COLONIES/mL KLEBSIELLA PNEUMONIAE (A)  Final   Report Status 05/09/2021 FINAL  Final   Organism ID, Bacteria KLEBSIELLA PNEUMONIAE (A)  Final      Susceptibility   Klebsiella pneumoniae - MIC*    AMPICILLIN >=32 RESISTANT Resistant     CEFAZOLIN 8 SENSITIVE Sensitive     CEFEPIME <=0.12 SENSITIVE Sensitive     CEFTRIAXONE <=0.25 SENSITIVE Sensitive     CIPROFLOXACIN <=0.25 SENSITIVE Sensitive     GENTAMICIN <=1 SENSITIVE Sensitive     IMIPENEM <=0.25 SENSITIVE Sensitive     NITROFURANTOIN 128 RESISTANT Resistant     TRIMETH/SULFA <=20 SENSITIVE  Sensitive     AMPICILLIN/SULBACTAM >=32 RESISTANT Resistant     PIP/TAZO >=128 RESISTANT Resistant     * >=100,000 COLONIES/mL KLEBSIELLA PNEUMONIAE  Culture, blood (routine x 2)     Status: None (Preliminary result)   Collection Time: 05/06/21  8:45 PM   Specimen: BLOOD  Result Value Ref Range Status   Specimen Description   Final    BLOOD LEFT Performed at [place] [address], [place], [place] [address]    Special Requests   Final    BOTTLES DRAWN AEROBIC AND ANAEROBIC Blood Culture adequate volume Performed at [place] [address] [place], [place] [address]    Culture   Final    NO GROWTH 3 DAYS Performed at [facility], [place] [address], [place], [place] [address]    Report Status PENDING  Incomplete  Culture, blood (routine x 2)     Status: None (Preliminary result)   Collection Time: 05/06/21  9:05 PM   Specimen: BLOOD  Result Value Ref Range Status   Specimen Description   Final    BLOOD UPPER BLOOD LEFT ARM Performed at [place] [address] [place], [place] [address]    Special Requests   Final    BOTTLES DRAWN AEROBIC AND ANAEROBIC Blood Culture adequate volume Performed at [place] [address] [place],  [address]    Culture   Final    NO GROWTH 3 DAYS Performed at [facility], [place]  [address], [place], [place] [address]    Report Status PENDING  Incomplete  MRSA Next Gen by PCR, Nasal     Status: None   Collection Time: 05/07/21  3:11 AM   Specimen: Nasal Mucosa; Nasal Swab  Result Value Ref Range Status   MRSA by PCR Next Gen NOT DETECTED NOT DETECTED Final    Comment: (NOTE) The GeneXpert MRSA Assay (FDA approved for NASAL specimens only), is one component of a comprehensive MRSA colonization surveillance program. It is not intended to diagnose MRSA infection nor to guide or monitor treatment for MRSA infections. Test performance  is not FDA approved in patients less than 15 years old. Performed at [facility], [place] [address], [place], [place] [address]   Resp Panel by RT-PCR (Flu A&B, Covid) Nasopharyngeal Swab     Status: None   Collection Time: 05/07/21 12:05 PM   Specimen: Nasopharyngeal Swab; Nasopharyngeal(NP) swabs in vial transport medium  Result Value Ref Range Status   SARS Coronavirus 2 by RT PCR NEGATIVE NEGATIVE Final    Comment: (NOTE) SARS-CoV-2 target nucleic acids are NOT DETECTED.  The SARS-CoV-2 RNA is generally detectable in upper respiratory specimens during the acute phase of infection. The lowest concentration of SARS-CoV-2 viral copies this assay can detect is 138 copies/mL. A negative result does not preclude SARS-Cov-2 infection and should not be used as the sole basis for treatment or other patient management decisions. A negative result may occur with  improper specimen collection/handling, submission of specimen other than nasopharyngeal swab, presence of viral mutation(s) within the areas targeted by this assay, and inadequate number of viral copies(<138 copies/mL). A negative result must be combined with clinical observations, patient history, and epidemiological information. The expected result is Negative.  Fact Sheet for Patients:  EntrepreneurPulse.com.au  Fact Sheet for Healthcare Providers:  IncredibleEmployment.be  This test is no t yet approved or cleared by the [place] FDA and  has been authorized for detection and/or diagnosis of SARS-CoV-2 by FDA under an Emergency Use Authorization (EUA). This EUA will remain  in effect (meaning this test can be used) for the duration of the COVID-19 declaration under Section 564(b)(1) of the Act, 21 U.S.C.section 360bbb-3(b)(1), unless the authorization is terminated  or revoked sooner.       Influenza A by PCR NEGATIVE NEGATIVE Final   Influenza B by PCR NEGATIVE  NEGATIVE Final    Comment: (NOTE) The Xpert Xpress SARS-CoV-2/FLU/RSV plus assay is intended as an aid in the diagnosis of influenza from Nasopharyngeal swab specimens and should not be used as a sole basis for treatment. Nasal washings and aspirates are unacceptable for Xpert Xpress SARS-CoV-2/FLU/RSV testing.  Fact Sheet for Patients: EntrepreneurPulse.com.au  Fact Sheet for Healthcare Providers: IncredibleEmployment.be  This test is not yet approved or cleared by the [place] FDA and has been authorized for detection and/or diagnosis of SARS-CoV-2 by FDA under an Emergency Use Authorization (EUA). This EUA will remain in effect (meaning this test can be used) for the duration of the COVID-19 declaration under Section 564(b)(1) of the Act, 21 U.S.C. section 360bbb-3(b)(1), unless the authorization is terminated or revoked.  Performed at [facility], [place] [address] [place], [place] [address]   CSF culture w Gram Stain     Status: None (Preliminary result)   Collection Time: 05/07/21  2:56 PM   Specimen: CSF; Cerebrospinal Fluid  Result Value Ref Range Status   Specimen Description   Final    CSF Performed  at [facility], [place] [address], [place], [place] [address]    Special Requests   Final    NONE Performed at [facility], [place] [address] [place], [place] [address]    Gram Stain   Final    NO WBC SEEN NO ORGANISMS SEEN CYTOSPIN SMEAR Gram Stain Report Called to,Read Back By and Verified With: HEAVENER,A RN @1622  ON 05/07/21 BY GOLSONM Performed at [facility], [place] [address] [place], [place] [address]    Culture   Final    NO GROWTH 2 DAYS Performed at [place] [address], [place], [place] [address]    Report Status PENDING  Incomplete  Culture, fungus without smear     Status: None (Preliminary result)   Collection Time: 05/07/21   2:56 PM   Specimen: PATH Cytology CSF; Cerebrospinal Fluid  Result Value Ref Range Status   Specimen Description   Final    CSF Performed at [place] [address] [place], [place] [address]    Special Requests   Final    NONE Performed at [facility], [place] [address] [place], [place] [address]    Culture   Final    NO FUNGUS ISOLATED AFTER 1 DAY Performed at [facility], [place] [address], [place], [place] [address]    Report Status PENDING  Incomplete      Radiology Studies: CT MAXILLOFACIAL WO CONTRAST  Result Date: 05/08/2021 CLINICAL DATA:  Maxillofacial dental pain, question abscess EXAM: CT MAXILLOFACIAL WITHOUT CONTRAST TECHNIQUE: Multidetector CT imaging of the maxillofacial structures was performed. Multiplanar CT image reconstructions were also generated. COMPARISON:  Head CT from 2 days ago FINDINGS: Osseous: No notable tooth devitalization or periapical erosion. Negative for fracture or aggressive bone lesion. Orbits: Bilateral cataract resection. No acute or inflammatory finding Sinuses: Overall mild patchy mucosal thickening which is generalized. Greatest mucosal thickening in the left frontal ethmoidal region with frontoethmoidal recess effacement. Soft tissues: No visible inflammation.  Atheromatous calcification Limited intracranial: Negative IMPRESSION: 1. No visible abscess or odontogenic infection. 2. Sinus mucosal thickening with left frontal ethmoidal recess obstruction. Electronically Signed   By: Jorje Guild M.D.   On: 05/08/2021 07:00   DG FLUORO GUIDE LUMBAR PUNCTURE  Result Date: 05/07/2021 CLINICAL DATA:  Patient for diagnostic lumbar puncture. EXAM: DIAGNOSTIC LUMBAR PUNCTURE UNDER FLUOROSCOPIC GUIDANCE COMPARISON:  [facility] FLUOROSCOPY TIME:  Fluoroscopy Time:  30 seconds Radiation Exposure Index (if provided by the fluoroscopic device): 6.5 mGy Number of Acquired Spot Images: 5 PROCEDURE: Informed consent  was obtained from the patient prior to the procedure, including potential complications of headache, allergy, and pain. With the patient prone, the lower back was prepped with Betadine. 1% Lidocaine was used for local anesthesia. Lumbar puncture was performed at the L4-5 level using a 20 gauge needle with return of clear CSF. 7 ml of CSF were obtained for laboratory studies. The patient tolerated the procedure well and there were no apparent complications. IMPRESSION: Successful lumbar puncture. Electronically Signed   By: Lovey Newcomer M.D.   On: 05/07/2021 15:34     Scheduled Meds:  amiodarone  200 mg Oral Daily   aspirin  325 mg Oral Once per day on Mon Wed Fri   budesonide  9 mg Oral Daily   Chlorhexidine Gluconate Cloth  6 each Topical [id number]   folic acid  1 mg Oral Daily   Gerhardt's butt cream   Topical BID  insulin aspart  0-15 Units Subcutaneous TID WC   metoprolol tartrate  25 mg Oral BID   sodium chloride flush  10-40 mL Intracatheter Q12H   Continuous Infusions:  acyclovir (ZOVIRAX) [id number] mg IVPB 710 mg (05/09/21 0445)   cefTRIAXone (ROCEPHIN)  IV 2 g (05/09/21 0921)   lactated ringers 75 mL/hr at 05/09/21 0444     LOS: 2 days    Flora Lipps, MD  [facility] 05/09/2021, 11:13 AM

## 2021-05-09 NOTE — Progress Notes (Signed)
Inpatient Diabetes Program Recommendations  AACE/ADA: New Consensus Statement on Inpatient Glycemic Control (2015)  Target Ranges:  Prepandial:   less than 140 mg/dL      Peak postprandial:   less than 180 mg/dL (1-2 hours)      Critically ill patients:  140 - 180 mg/dL   Lab Results  Component Value Date   GLUCAP 241 (H) 05/09/2021   HGBA1C 9.8 (H) 04/04/2021    Review of Glycemic Control  Diabetes history: DM 2 Outpatient Diabetes medications: Novolog 2-12 units tid, Actos 15 mg Daily, Soliqua 100-33 23 units qhs Current orders for Inpatient glycemic control:  Novolog 0-15 units tid  A1c 9.8% on 8/29. Pt forgetful to take meds at home will need assistance for medications at home for optimal glycemic control.  Inpatient Diabetes Program Recommendations:    -  Add Semglee 10 units (less than half home basal insulin dose) -  Add Novolog hs scale   Thanks,  Tama Headings RN, MSN, BC-ADM Inpatient Diabetes Coordinator Team Pager [phone] (8a-5p) ]

## 2021-05-10 DIAGNOSIS — I10 Essential (primary) hypertension: Secondary | ICD-10-CM

## 2021-05-10 LAB — BASIC METABOLIC PANEL
Anion gap: 4 — ABNORMAL LOW (ref 5–15)
BUN: 13 mg/dL (ref 8–23)
CO2: 29 mmol/L (ref 22–32)
Calcium: 8.3 mg/dL — ABNORMAL LOW (ref 8.9–10.3)
Chloride: 104 mmol/L (ref 98–111)
Creatinine, Ser: <0.3 mg/dL — ABNORMAL LOW (ref 0.61–1.24)
Glucose, Bld: 177 mg/dL — ABNORMAL HIGH (ref 70–99)
Potassium: 3.5 mmol/L (ref 3.5–5.1)
Sodium: 137 mmol/L (ref 135–145)

## 2021-05-10 LAB — HSV 1/2 PCR, CSF
HSV-1 DNA: NEGATIVE
HSV-2 DNA: NEGATIVE

## 2021-05-10 LAB — GLUCOSE, CAPILLARY
Glucose-Capillary: 170 mg/dL — ABNORMAL HIGH (ref 70–99)
Glucose-Capillary: 196 mg/dL — ABNORMAL HIGH (ref 70–99)
Glucose-Capillary: 239 mg/dL — ABNORMAL HIGH (ref 70–99)

## 2021-05-10 LAB — CYTOLOGY - NON PAP

## 2021-05-10 MED ORDER — DOXYCYCLINE HYCLATE 100 MG PO CAPS: 100.0000 mg | ORAL_CAPSULE | Freq: Every day | ORAL | Status: DC

## 2021-05-10 MED ORDER — CEFDINIR 300 MG PO CAPS: 300.0000 mg | ORAL_CAPSULE | Freq: Two times a day (BID) | ORAL | 0 refills | Status: DC

## 2021-05-10 NOTE — Progress Notes (Signed)
Reviewed discharge instructions with pt including meds and follow up appointments. Pt verbalized understanding. Marshun Duva, Laurel Dimmer, RN

## 2021-05-10 NOTE — Discharge Summary (Signed)
Physician Discharge Summary  Tommy Peterson OVF:[id number] DOB: [birth year] DOA: 05/06/2021  PCP: [name], DO  Admit date: 05/06/2021 Discharge date: 05/10/2021  Admitted From: Home  Discharge disposition: Home PT  Recommendations for Outpatient Follow-Up:   Follow up with your primary care provider in one week.  Check CBC, BMP, magnesium in the next visit Follow-up with your oncologist as scheduled by you.  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  Diet recommendation: Low sodium, heart healthy.  Carbohydrate-modified.    Wound care: None.  Code status: Full.   History of Present Illness:   Tommy Peterson is a 80 y.o. male with past medical history of leukemia, atrial fibrillation not on anticoagulation, history of GI bleed in the past, chronic anemia with frequent transfusions presented to the hospital with confusion and agitation with back pain and headache.  Patient was then admitted to the  hospital for further evaluation and treatment.  Hospital Course:   Following conditions were addressed during hospitalization as listed below,  Acute metabolic encephalopathy Resolved.  Patient at baseline at this time.  Fully oriented.  Status post lumbar puncture without bacterial meningitis.  Negative HSV PCR.  Was initially on acyclovir which has been discontinued.  Patient significantly improved and was thought to be secondary to UTI.  Blood cultures without any growth.  Urine culture with Klebsiella -received IV Rocephin during hospitalization..  Chest x-ray no acute findings.  Patient complained of dental pain but CT maxilla negative for abscess.      Hypokalemia/hypomagnesemia Improved.  Continue to replenish potassium.  Magnesium level of 1.9.   Klebsiella UTI.  He was on IV Rocephin.  Due to immune compromised status, will continue Omnicef on discharge to complete the course.  Patient is on doxycycline prophylactically and will continue from Rocephin   Diabetes mellitus type 2 hyperglycemia Latest hemoglobin A1c of 9.8.  Patient has trouble remembering to take medication.  On insulin regimen and pioglitazone at home.   Paroxysmal atrial fibrillation. On amiodarone, continue beta-blockers.  Was on apixaban which was recently taken off due to anemia and bleeding issues.  Patient remained in sinus rhythm.   Large granular lymphocytic leukemia, transfusion-dependent anemia, immunosuppressed status, Patient takes PRBC transfusion every 2 weeks or so. Last one on 05/05/2021, was on methotrexate as outpatient.  Has been changed to Cytoxan recently by oncology.   Disposition.  At this time, patient is stable for disposition home with outpatient PCP oncology follow-up.  Medical Consultants:   Radiology  Procedures:    Fluoroscopy guided lumbar puncture  Subjective:   Today, patient was seen and examined at bedside.  Patient feels well and wishes to go home.  He denies any headache dizziness lightheadedness.  Discharge Exam:   Vitals:   05/10/21 0416 05/10/21 0800  BP: (!) 172/64 (!) 142/80  Pulse: 64 79  Resp: 14 16  Temp: 97.6 F (36.4 C) 97.8 F (36.6 C)  SpO2: 96% 98%   Vitals:   05/09/21 1200 05/09/21 2044 05/10/21 0416 05/10/21 0800  BP: (!) 156/61 (!) 151/90 (!) 172/64 (!) 142/80  Pulse: 73 95 64 79  Resp: 18 18 14 16   Temp: 98.5 F (36.9 C) 97.9 F (  36.6 C) 97.6 F (36.4 C) 97.8 F (36.6 C)  TempSrc: Axillary Oral Oral Oral  SpO2: 96% 97% 96% 98%  Weight:      Height:       General: Alert awake, not in obvious distress HENT: pupils equally reacting to light,  No scleral pallor or icterus noted. Oral mucosa  is moist.  Chest:  Clear breath sounds.  Diminished breath sounds bilaterally. No crackles or wheezes.  CVS: S1 &S2 heard. No murmur.  Regular rate and rhythm. Abdomen: Soft, nontender, nondistended.  Bowel sounds are heard.   Extremities: No cyanosis, clubbing or edema.  Peripheral pulses are palpable. Psych: Alert, awake and oriented, normal mood CNS:  No cranial nerve deficits.  Power equal in all extremities.   Skin: Warm and dry.  No rashes noted.  The results of significant diagnostics from this hospitalization (including imaging, microbiology, ancillary and laboratory) are listed below for reference.     Diagnostic Studies:   CT HEAD WO CONTRAST (5MM)  Result Date: 05/06/2021 CLINICAL DATA:  Confusion, altered level of consciousness EXAM: CT HEAD WITHOUT CONTRAST TECHNIQUE: Contiguous axial images were obtained from the base of the skull through the vertex without intravenous contrast. COMPARISON:  01/21/2021 FINDINGS: Brain: No acute infarct or hemorrhage. Lateral ventricles and midline structures are unremarkable. No acute extra-axial fluid collections. No mass effect. Vascular: No hyperdense vessel or unexpected calcification. Skull: Normal. Negative for fracture or focal lesion. Sinuses/Orbits: No acute finding. Other: None. IMPRESSION: 1. Stable head CT, no acute intracranial process. Electronically Signed   By: Randa Ngo M.D.   On: 05/06/2021 19:01   DG Chest Portable 1 View  Result Date: 05/06/2021 CLINICAL DATA:  Altered level of consciousness, confusion EXAM: PORTABLE CHEST 1 VIEW COMPARISON:  04/03/2021 FINDINGS: Single frontal view of the chest demonstrates a stable cardiac silhouette. There is chronic central vascular congestion and interstitial prominence, without focal airspace disease, effusion, or pneumothorax. No acute bony abnormalities. IMPRESSION: 1. Stable chest, no acute process. Electronically Signed   By: Randa Ngo M.D.   On: 05/06/2021 18:50   DG FLUORO  GUIDE LUMBAR PUNCTURE  Result Date: 05/07/2021 CLINICAL DATA:  Patient for diagnostic lumbar puncture. EXAM: DIAGNOSTIC LUMBAR PUNCTURE UNDER FLUOROSCOPIC GUIDANCE COMPARISON:  [facility] FLUOROSCOPY TIME:  Fluoroscopy Time:  30 seconds Radiation Exposure Index (if provided by the fluoroscopic device): 6.5 mGy Number of Acquired Spot Images: 5 PROCEDURE: Informed consent was obtained from the patient prior to the procedure, including potential complications of headache, allergy, and pain. With the patient prone, the lower back was prepped with Betadine. 1% Lidocaine was used for local anesthesia. Lumbar puncture was performed at the L4-5 level using a 20 gauge needle with return of clear CSF. 7 ml of CSF were obtained for laboratory studies. The patient tolerated the procedure well and there were no apparent complications. IMPRESSION: Successful lumbar puncture. Electronically Signed   By: Lovey Newcomer M.D.   On: 05/07/2021 15:34     Labs:   Basic Metabolic Panel: Recent Labs  Lab 05/06/21 1818 05/07/21 0317 05/08/21 0652 05/09/21 1110 05/10/21 0448  NA 134* 133* 137 138 137  K 4.0 3.4* 2.7* 3.4* 3.5  CL 96* 98 101 102 104  CO2 28 27 28 29 29   GLUCOSE 299* 229* 147* 278* 177*  BUN 18 10 6* 7* 13  CREATININE 0.63 0.44* 0.38* 0.46* <0.30*  CALCIUM 9.2 8.6* 8.4* 8.6* 8.3*  MG  --   --  1.7 1.9  --  GFR CrCl cannot be calculated (This lab value cannot be used to calculate CrCl because it is not a number: <0.30). Liver Function Tests: Recent Labs  Lab 05/04/21 0944 05/06/21 1818 05/07/21 0317  AST 14* 22 19  ALT 45* 52* 43  ALKPHOS 87 94 83  BILITOT 1.0 1.2 0.9  PROT 5.9* 6.9 6.2*  ALBUMIN 3.0* 3.6 3.1*   No results for input(s): LIPASE, AMYLASE in the last 168 hours. Recent Labs  Lab 05/07/21 1204  AMMONIA 16   Coagulation profile No results for input(s): INR, PROTIME in the last 168 hours.  CBC: Recent Labs  Lab 05/04/21 0944 05/06/21 1818 05/07/21 0317 05/08/21 0652  05/09/21 1258  WBC 12.9* 28.1* 22.4* 14.0* 10.2  NEUTROABS 6.5 18.2*  --  7.5  --   HGB 6.2* 9.5* 8.5* 7.5* 8.8*  HCT 18.7* 28.0* 24.5* 22.6* 26.5*  MCV 88.2 92.4 92.1 89.7 90.4  PLT 240 255 231 204 208   Cardiac Enzymes: No results for input(s): CKTOTAL, CKMB, CKMBINDEX, TROPONINI in the last 168 hours. BNP: Invalid input(s): POCBNP CBG: Recent Labs  Lab 05/09/21 1650 05/09/21 2041 05/10/21 0005 05/10/21 0410 05/10/21 0716  GLUCAP 221* 335* 239* 170* 196*   D-Dimer No results for input(s): DDIMER in the last 72 hours. Hgb A1c No results for input(s): HGBA1C in the last 72 hours. Lipid Profile No results for input(s): CHOL, HDL, LDLCALC, TRIG, CHOLHDL, LDLDIRECT in the last 72 hours. Thyroid function studies No results for input(s): TSH, T4TOTAL, T3FREE, THYROIDAB in the last 72 hours.  Invalid input(s): FREET3 Anemia work up No results for input(s): VITAMINB12, FOLATE, FERRITIN, TIBC, IRON, RETICCTPCT in the last 72 hours. Microbiology Recent Results (from the past 240 hour(s))  Urine Culture     Status: Abnormal   Collection Time: 05/06/21  6:08 PM   Specimen: Urine, Clean Catch  Result Value Ref Range Status   Specimen Description   Final    URINE, CLEAN CATCH Performed at [facility] [place] [address] [place], [place] [address]    Special Requests   Final    NONE Performed at [facility], [place] [address], [place], [place] [address]    Culture >=100,000 COLONIES/mL KLEBSIELLA PNEUMONIAE (A)  Final   Report Status 05/09/2021 FINAL  Final   Organism ID, Bacteria KLEBSIELLA PNEUMONIAE (A)  Final      Susceptibility   Klebsiella pneumoniae - MIC*    AMPICILLIN >=32 RESISTANT Resistant     CEFAZOLIN 8 SENSITIVE Sensitive     CEFEPIME <=0.12 SENSITIVE Sensitive     CEFTRIAXONE <=0.25 SENSITIVE Sensitive     CIPROFLOXACIN <=0.25 SENSITIVE Sensitive     GENTAMICIN <=1 SENSITIVE Sensitive     IMIPENEM <=0.25 SENSITIVE  Sensitive     NITROFURANTOIN 128 RESISTANT Resistant     TRIMETH/SULFA <=20 SENSITIVE Sensitive     AMPICILLIN/SULBACTAM >=32 RESISTANT Resistant     PIP/TAZO >=128 RESISTANT Resistant     * >=100,000 COLONIES/mL KLEBSIELLA PNEUMONIAE  Culture, blood (routine x 2)     Status: None (Preliminary result)   Collection Time: 05/06/21  8:45 PM   Specimen: BLOOD  Result Value Ref Range Status   Specimen Description   Final    BLOOD LEFT Performed at [place] [address], [place], [place] [address]    Special Requests   Final    BOTTLES DRAWN AEROBIC AND ANAEROBIC Blood Culture adequate volume Performed at [place] [facility]  [place], [place] [address]    Culture   Final    NO GROWTH 4 DAYS Performed at [facility], [place] [address] [place], [place] [address]    Report Status PENDING  Incomplete  Culture, blood (routine x 2)     Status: None (Preliminary result)   Collection Time: 05/06/21  9:05 PM   Specimen: BLOOD  Result Value Ref Range Status   Specimen Description   Final    BLOOD UPPER BLOOD LEFT ARM Performed at [place] [address], [place], [place] [address]    Special Requests   Final    BOTTLES DRAWN AEROBIC AND ANAEROBIC Blood Culture adequate volume Performed at [place] [address] [place], [place] [address]    Culture   Final    NO GROWTH 4 DAYS Performed at [facility], [place] [address] [place], [place] [address]    Report Status PENDING  Incomplete  MRSA Next Gen by PCR, Nasal     Status: None   Collection Time: 05/07/21  3:11 AM   Specimen: Nasal Mucosa; Nasal Swab  Result Value Ref Range Status   MRSA by PCR Next Gen NOT DETECTED NOT DETECTED Final    Comment: (NOTE) The GeneXpert MRSA Assay (FDA approved for NASAL specimens only), is one component of a comprehensive MRSA colonization surveillance program. It is not intended to diagnose  MRSA infection nor to guide or monitor treatment for MRSA infections. Test performance is not FDA approved in patients less than 4 years old. Performed at [facility], [place] [address], [place],  [address]   Resp Panel by RT-PCR (Flu A&B, Covid) Nasopharyngeal Swab     Status: None   Collection Time: 05/07/21 12:05 PM   Specimen: Nasopharyngeal Swab; Nasopharyngeal(NP) swabs in vial transport medium  Result Value Ref Range Status   SARS Coronavirus 2 by RT PCR NEGATIVE NEGATIVE Final    Comment: (NOTE) SARS-CoV-2 target nucleic acids are NOT DETECTED.  The SARS-CoV-2 RNA is generally detectable in upper respiratory specimens during the acute phase of infection. The lowest concentration of SARS-CoV-2 viral copies this assay can detect is 138 copies/mL. A negative result does not preclude SARS-Cov-2 infection and should not be used as the sole basis for treatment or other patient management decisions. A negative result may occur with  improper specimen collection/handling, submission of specimen other than nasopharyngeal swab, presence of viral mutation(s) within the areas targeted by this assay, and inadequate number of viral copies(<138 copies/mL). A negative result must be combined with clinical observations, patient history, and epidemiological information. The expected result is Negative.  Fact Sheet for Patients:  EntrepreneurPulse.com.au  Fact Sheet for Healthcare Providers:  IncredibleEmployment.be  This test is no t yet approved or cleared by the [place] FDA and  has been authorized for detection and/or diagnosis of SARS-CoV-2 by FDA under an Emergency Use Authorization (EUA). This EUA will remain  in effect (meaning this test can be used) for the duration of the COVID-19 declaration under Section 564(b)(1) of the Act, 21 U.S.C.section 360bbb-3(b)(1), unless the authorization is terminated  or revoked  sooner.       Influenza A by PCR NEGATIVE NEGATIVE Final   Influenza B by PCR NEGATIVE NEGATIVE Final    Comment: (NOTE) The Xpert Xpress SARS-CoV-2/FLU/RSV plus assay is intended as an aid in the diagnosis of influenza from Nasopharyngeal swab specimens and should not be used as a sole basis for treatment.  Nasal washings and aspirates are unacceptable for Xpert Xpress SARS-CoV-2/FLU/RSV testing.  Fact Sheet for Patients: EntrepreneurPulse.com.au  Fact Sheet for Healthcare Providers: IncredibleEmployment.be  This test is not yet approved or cleared by the [place] FDA and has been authorized for detection and/or diagnosis of SARS-CoV-2 by FDA under an Emergency Use Authorization (EUA). This EUA will remain in effect (meaning this test can be used) for the duration of the COVID-19 declaration under Section 564(b)(1) of the Act, 21 U.S.C. section 360bbb-3(b)(1), unless the authorization is terminated or revoked.  Performed at [facility], [place] [address], [place], [place] [address]   CSF culture w Gram Stain     Status: None (Preliminary result)   Collection Time: 05/07/21  2:56 PM   Specimen: CSF; Cerebrospinal Fluid  Result Value Ref Range Status   Specimen Description   Final    CSF Performed at [place] [address] [place], [place] [address]    Special Requests   Final    NONE Performed at [facility], [place] [address], [place], [place] [address]    Gram Stain   Final    NO WBC SEEN NO ORGANISMS SEEN CYTOSPIN SMEAR Gram Stain Report Called to,Read Back By and Verified With: HEAVENER,A RN @1622  ON 05/07/21 BY GOLSONM Performed at [facility], [place] [address], [place], [place] [address]    Culture   Final    NO GROWTH 3 DAYS Performed at [facility], [place] [address], [place], [place] [address]    Report Status PENDING  Incomplete   Culture, fungus without smear     Status: None (Preliminary result)   Collection Time: 05/07/21  2:56 PM   Specimen: PATH Cytology CSF; Cerebrospinal Fluid  Result Value Ref Range Status   Specimen Description   Final    CSF Performed at [place] [address], [place], [place] [address]    Special Requests   Final    NONE Performed at [facility], [place] [address], [place], [place] [address]    Culture   Final    NO FUNGUS ISOLATED AFTER 2 DAYS Performed at [facility], [place] [address] [place],  [place] [address]    Report Status PENDING  Incomplete     Discharge Instructions:   Discharge Instructions     Call MD for:  temperature >100.4   Complete by: As directed    Diet - low sodium heart healthy   Complete by: As directed    Diet Carb Modified   Complete by: As directed    Discharge instructions   Complete by: As directed    Complete the course of antibiotic.  Start doxycycline after you complete the course of Omnicef.  Follow-up with your primary care physician in 1 week.  Follow-up with your oncologist as scheduled by you.  Seek medical attention for worsening symptoms.   Increase activity slowly   Complete by: As directed    Increase activity slowly   Complete by: As directed    No wound care   Complete by: As directed    No wound care   Complete by: As directed       Allergies as of 05/10/2021       Reactions   Bee Venom Swelling, Anaphylaxis   Metformin And Related Nausea And Vomiting   Sulfamethoxazole-trimethoprim Nausea And Vomiting   Bee Pollen Other (See Comments)   Unknown  Metformin Hcl Nausea And Vomiting, Other (See Comments)   Made the patient feel sick        Medication List     STOP taking these medications    furosemide 20 MG tablet Commonly known as: LASIX       TAKE these medications    acetaminophen 325 MG tablet Commonly known as: TYLENOL Take 650 mg by mouth 2 (two)  times daily as needed for fever.   amiodarone 200 MG tablet Commonly known as: PACERONE Take 1 tablet (200 mg total) by mouth daily.   aspirin 325 MG EC tablet Take 325 mg by mouth 3 (three) times a week.   B COMPLEX 100 PO Take 1 tablet by mouth 2 (two) times daily.   BD Pen Needle Nano 2nd Gen 32G X 4 MM Misc Generic drug: Insulin Pen Needle   budesonide 3 MG 24 hr capsule Commonly known as: ENTOCORT EC Take 9 mg by mouth in the morning.   cefdinir 300 MG capsule Commonly known as: OMNICEF Take 1 capsule (300 mg total) by mouth 2 (two) times daily for 5 days.   CINNAMON PLUS CHROMIUM PO Take 2 tablets by mouth in the morning and at bedtime.   Cranberry 400 MG Tabs Take 400 mg by mouth 2 (two) times daily.   cyclophosphamide 50 MG capsule Commonly known as: CYTOXAN Take 1 capsule (50 mg total) by mouth daily. Take with food to minimize GI upset. Take early in the day and maintain hydration.   D-Mannose 500 MG Caps Take 500 mg by mouth in the morning and at bedtime.   doxycycline 100 MG capsule Commonly known as: VIBRAMYCIN Take 1 capsule (100 mg total) by mouth at bedtime. Start taking on: May 16, 2021 What changed: These instructions start on May 16, 2021. If you are unsure what to do until then, ask your doctor or other care provider.   folic acid 1 MG tablet Commonly known as: FOLVITE TAKE 1 TABLET BY MOUTH EVERY DAY What changed: when to take this   FreeStyle Libre 2 Sensor Misc Inject 1 patch into the skin every 14 (fourteen) days.   metoprolol tartrate 50 MG tablet Commonly known as: LOPRESSOR Take 50 mg by mouth in the morning and at bedtime.   NovoLOG FlexPen 100 UNIT/ML FlexPen Generic drug: insulin aspart Inject 1-15 Units into the skin 3 (three) times daily with meals. Glucose 121 - 150: 2 units, Glucose 151 - 200: 3 units, Glucose 201 - 250: 5 units, Glucose 251 - 300: 8 units, Glucose 301 - 350: 11 units, Glucose 351 - 400: 15 units,  Glucose > 400 call MD What changed: how much to take   omeprazole 40 MG capsule Commonly known as: PRILOSEC Take 40 mg by mouth 2 (two) times daily.   oxybutynin 5 MG tablet Commonly known as: DITROPAN Take 5 mg by mouth in the morning.   pioglitazone 15 MG tablet Commonly known as: ACTOS Take 15 mg by mouth every morning.   Prostate Health Caps Take 1 capsule by mouth daily with breakfast.   Soliqua 100-33 UNT-MCG/ML Sopn Generic drug: Insulin Glargine-Lixisenatide Inject 23 Units into the skin at bedtime.   Vitamin B-12 5000 MCG Subl Place 5,000 mcg under the tongue daily.        Follow-up Information     [name], DO Follow up in 1 week(s).   Specialty: Family Medicine Contact information: [address] [place] [address] [phone]  [name], MD .   Specialty: Cardiology Contact information: [address] [place] [address] [phone]                  Time coordinating discharge: 39 minutes  Signed:  Gennette Shadix  [facility] 05/10/2021, 8:54 AM

## 2021-05-10 NOTE — Plan of Care (Signed)
  Problem: Education: Goal: Knowledge of General Education information will improve Description: Including pain rating scale, medication(s)/side effects and non-pharmacologic comfort measures Outcome: Completed/Met   Problem: Activity: Goal: Risk for activity intolerance will decrease Outcome: Completed/Met   Problem: Nutrition: Goal: Adequate nutrition will be maintained Outcome: Completed/Met   Problem: Pain Managment: Goal: General experience of comfort will improve Outcome: Completed/Met   

## 2021-05-11 ENCOUNTER — Inpatient Hospital Stay

## 2021-05-11 ENCOUNTER — Inpatient Hospital Stay: Attending: Hematology

## 2021-05-11 DIAGNOSIS — D72829 Elevated white blood cell count, unspecified: Secondary | ICD-10-CM | POA: Insufficient documentation

## 2021-05-11 DIAGNOSIS — C91Z2 Other lymphoid leukemia, in relapse: Secondary | ICD-10-CM | POA: Insufficient documentation

## 2021-05-11 LAB — CULTURE, BLOOD (ROUTINE X 2)
Culture: NO GROWTH
Culture: NO GROWTH
Special Requests: ADEQUATE
Special Requests: ADEQUATE

## 2021-05-11 LAB — CSF CULTURE W GRAM STAIN
Culture: NO GROWTH
Gram Stain: NONE SEEN

## 2021-05-14 ENCOUNTER — Emergency Department (HOSPITAL_COMMUNITY): Payer: No Typology Code available for payment source

## 2021-05-14 ENCOUNTER — Encounter (HOSPITAL_COMMUNITY): Payer: Self-pay | Admitting: Internal Medicine

## 2021-05-14 ENCOUNTER — Other Ambulatory Visit: Payer: Self-pay

## 2021-05-14 ENCOUNTER — Inpatient Hospital Stay (HOSPITAL_COMMUNITY)
Admission: EM | Admit: 2021-05-14 | Discharge: 2021-05-16 | DRG: 193 | Disposition: A | Discharge status: Hospice | Payer: No Typology Code available for payment source | Attending: Internal Medicine | Admitting: Internal Medicine

## 2021-05-14 DIAGNOSIS — Z91030 Bee allergy status: Secondary | ICD-10-CM

## 2021-05-14 DIAGNOSIS — G9341 Metabolic encephalopathy: Secondary | ICD-10-CM | POA: Diagnosis present

## 2021-05-14 DIAGNOSIS — D849 Immunodeficiency, unspecified: Secondary | ICD-10-CM | POA: Diagnosis present

## 2021-05-14 DIAGNOSIS — Z79630 Long term (current) use of alkylating agent: Secondary | ICD-10-CM

## 2021-05-14 DIAGNOSIS — Z85828 Personal history of other malignant neoplasm of skin: Secondary | ICD-10-CM | POA: Diagnosis not present

## 2021-05-14 DIAGNOSIS — Z9841 Cataract extraction status, right eye: Secondary | ICD-10-CM

## 2021-05-14 DIAGNOSIS — Z20822 Contact with and (suspected) exposure to covid-19: Secondary | ICD-10-CM | POA: Diagnosis present

## 2021-05-14 DIAGNOSIS — R0689 Other abnormalities of breathing: Secondary | ICD-10-CM | POA: Diagnosis not present

## 2021-05-14 DIAGNOSIS — D6489 Other specified anemias: Secondary | ICD-10-CM | POA: Diagnosis present

## 2021-05-14 DIAGNOSIS — I480 Paroxysmal atrial fibrillation: Secondary | ICD-10-CM | POA: Diagnosis present

## 2021-05-14 DIAGNOSIS — K5909 Other constipation: Secondary | ICD-10-CM | POA: Diagnosis present

## 2021-05-14 DIAGNOSIS — C9590 Leukemia, unspecified not having achieved remission: Secondary | ICD-10-CM

## 2021-05-14 DIAGNOSIS — I10 Essential (primary) hypertension: Secondary | ICD-10-CM

## 2021-05-14 DIAGNOSIS — Z66 Do not resuscitate: Secondary | ICD-10-CM | POA: Diagnosis present

## 2021-05-14 DIAGNOSIS — J189 Pneumonia, unspecified organism: Principal | ICD-10-CM | POA: Diagnosis present

## 2021-05-14 DIAGNOSIS — N400 Enlarged prostate without lower urinary tract symptoms: Secondary | ICD-10-CM | POA: Diagnosis present

## 2021-05-14 DIAGNOSIS — Z794 Long term (current) use of insulin: Secondary | ICD-10-CM

## 2021-05-14 DIAGNOSIS — I5032 Chronic diastolic (congestive) heart failure: Secondary | ICD-10-CM | POA: Diagnosis present

## 2021-05-14 DIAGNOSIS — C91Z0 Other lymphoid leukemia not having achieved remission: Secondary | ICD-10-CM | POA: Diagnosis present

## 2021-05-14 DIAGNOSIS — G5603 Carpal tunnel syndrome, bilateral upper limbs: Secondary | ICD-10-CM | POA: Diagnosis present

## 2021-05-14 DIAGNOSIS — R627 Adult failure to thrive: Secondary | ICD-10-CM | POA: Diagnosis present

## 2021-05-14 DIAGNOSIS — A419 Sepsis, unspecified organism: Secondary | ICD-10-CM

## 2021-05-14 DIAGNOSIS — Z961 Presence of intraocular lens: Secondary | ICD-10-CM | POA: Diagnosis present

## 2021-05-14 DIAGNOSIS — R739 Hyperglycemia, unspecified: Secondary | ICD-10-CM | POA: Diagnosis not present

## 2021-05-14 DIAGNOSIS — R54 Age-related physical debility: Secondary | ICD-10-CM | POA: Diagnosis present

## 2021-05-14 DIAGNOSIS — Z888 Allergy status to other drugs, medicaments and biological substances status: Secondary | ICD-10-CM

## 2021-05-14 DIAGNOSIS — Z79899 Other long term (current) drug therapy: Secondary | ICD-10-CM

## 2021-05-14 DIAGNOSIS — Z7952 Long term (current) use of systemic steroids: Secondary | ICD-10-CM

## 2021-05-14 DIAGNOSIS — I5030 Unspecified diastolic (congestive) heart failure: Secondary | ICD-10-CM | POA: Diagnosis present

## 2021-05-14 DIAGNOSIS — Z87891 Personal history of nicotine dependence: Secondary | ICD-10-CM | POA: Diagnosis not present

## 2021-05-14 DIAGNOSIS — Z833 Family history of diabetes mellitus: Secondary | ICD-10-CM | POA: Diagnosis not present

## 2021-05-14 DIAGNOSIS — I110 Hypertensive heart disease with heart failure: Secondary | ICD-10-CM | POA: Diagnosis present

## 2021-05-14 DIAGNOSIS — N390 Urinary tract infection, site not specified: Secondary | ICD-10-CM | POA: Diagnosis present

## 2021-05-14 DIAGNOSIS — Z8249 Family history of ischemic heart disease and other diseases of the circulatory system: Secondary | ICD-10-CM

## 2021-05-14 DIAGNOSIS — Z9842 Cataract extraction status, left eye: Secondary | ICD-10-CM

## 2021-05-14 DIAGNOSIS — K52831 Collagenous colitis: Secondary | ICD-10-CM | POA: Diagnosis present

## 2021-05-14 DIAGNOSIS — R4182 Altered mental status, unspecified: Secondary | ICD-10-CM | POA: Diagnosis not present

## 2021-05-14 DIAGNOSIS — R0902 Hypoxemia: Secondary | ICD-10-CM | POA: Diagnosis not present

## 2021-05-14 DIAGNOSIS — Z7982 Long term (current) use of aspirin: Secondary | ICD-10-CM

## 2021-05-14 DIAGNOSIS — E876 Hypokalemia: Secondary | ICD-10-CM | POA: Diagnosis present

## 2021-05-14 DIAGNOSIS — E119 Type 2 diabetes mellitus without complications: Secondary | ICD-10-CM | POA: Diagnosis present

## 2021-05-14 DIAGNOSIS — K529 Noninfective gastroenteritis and colitis, unspecified: Secondary | ICD-10-CM | POA: Diagnosis present

## 2021-05-14 DIAGNOSIS — R404 Transient alteration of awareness: Secondary | ICD-10-CM | POA: Diagnosis not present

## 2021-05-14 DIAGNOSIS — R7401 Elevation of levels of liver transaminase levels: Secondary | ICD-10-CM | POA: Diagnosis present

## 2021-05-14 LAB — URINALYSIS, ROUTINE W REFLEX MICROSCOPIC
Bilirubin Urine: NEGATIVE
Glucose, UA: 50 mg/dL — AB
Hgb urine dipstick: NEGATIVE
Ketones, ur: 5 mg/dL — AB
Leukocytes,Ua: NEGATIVE
Nitrite: NEGATIVE
Protein, ur: NEGATIVE mg/dL
Specific Gravity, Urine: 1.013 (ref 1.005–1.030)
pH: 7 (ref 5.0–8.0)

## 2021-05-14 LAB — RESP PANEL BY RT-PCR (FLU A&B, COVID) ARPGX2
Influenza A by PCR: NEGATIVE
Influenza B by PCR: NEGATIVE
SARS Coronavirus 2 by RT PCR: NEGATIVE

## 2021-05-14 LAB — COMPREHENSIVE METABOLIC PANEL
ALT: 54 U/L — ABNORMAL HIGH (ref 0–44)
AST: 23 U/L (ref 15–41)
Albumin: 3.2 g/dL — ABNORMAL LOW (ref 3.5–5.0)
Alkaline Phosphatase: 93 U/L (ref 38–126)
Anion gap: 9 (ref 5–15)
BUN: 13 mg/dL (ref 8–23)
CO2: 24 mmol/L (ref 22–32)
Calcium: 8.6 mg/dL — ABNORMAL LOW (ref 8.9–10.3)
Chloride: 99 mmol/L (ref 98–111)
Creatinine, Ser: 0.47 mg/dL — ABNORMAL LOW (ref 0.61–1.24)
GFR, Estimated: >60 mL/min (ref >60)
Glucose, Bld: 202 mg/dL — ABNORMAL HIGH (ref 70–99)
Potassium: 3.8 mmol/L (ref 3.5–5.1)
Sodium: 132 mmol/L — ABNORMAL LOW (ref 135–145)
Total Bilirubin: 1.3 mg/dL — ABNORMAL HIGH (ref 0.3–1.2)
Total Protein: 6 g/dL — ABNORMAL LOW (ref 6.5–8.1)

## 2021-05-14 LAB — CBC WITH DIFFERENTIAL/PLATELET
Abs Immature Granulocytes: 0.3 10*3/uL — ABNORMAL HIGH (ref 0.00–0.07)
Basophils Absolute: 0 10*3/uL (ref 0.0–0.1)
Basophils Relative: 0 %
Eosinophils Absolute: 0 10*3/uL (ref 0.0–0.5)
Eosinophils Relative: 0 %
HCT: 23.5 % — ABNORMAL LOW (ref 39.0–52.0)
Hemoglobin: 7.9 g/dL — ABNORMAL LOW (ref 13.0–17.0)
Immature Granulocytes: 2 %
Lymphocytes Relative: 26 %
Lymphs Abs: 5 10*3/uL — ABNORMAL HIGH (ref 0.7–4.0)
MCH: 33.1 pg (ref 26.0–34.0)
MCHC: 33.6 g/dL (ref 30.0–36.0)
MCV: 98.3 fL (ref 80.0–100.0)
Monocytes Absolute: 2.2 10*3/uL — ABNORMAL HIGH (ref 0.1–1.0)
Monocytes Relative: 12 %
Neutro Abs: 11.4 10*3/uL — ABNORMAL HIGH (ref 1.7–7.7)
Neutrophils Relative %: 60 %
Platelets: 322 10*3/uL (ref 150–400)
RBC: 2.39 MIL/uL — ABNORMAL LOW (ref 4.22–5.81)
RDW: 14.8 % (ref 11.5–15.5)
WBC: 18.9 10*3/uL — ABNORMAL HIGH (ref 4.0–10.5)
nRBC: 0 % (ref 0.0–0.2)

## 2021-05-14 LAB — CBG MONITORING, ED: Glucose-Capillary: 194 mg/dL — ABNORMAL HIGH (ref 70–99)

## 2021-05-14 LAB — PROTIME-INR
INR: 1.2 (ref 0.8–1.2)
Prothrombin Time: 14.7 seconds (ref 11.4–15.2)

## 2021-05-14 LAB — LACTIC ACID, PLASMA
Lactic Acid, Venous: 1.5 mmol/L (ref 0.5–1.9)
Lactic Acid, Venous: 1.8 mmol/L (ref 0.5–1.9)

## 2021-05-14 LAB — GLUCOSE, CAPILLARY: Glucose-Capillary: 178 mg/dL — ABNORMAL HIGH (ref 70–99)

## 2021-05-14 IMAGING — CR DG CHEST 2V
2 series · 2 of 2 positions shown · non-contrast
Comparison: [DATE]

CLINICAL DATA: Sepsis

EXAM:
CHEST - 2 VIEW

[w chest lat]
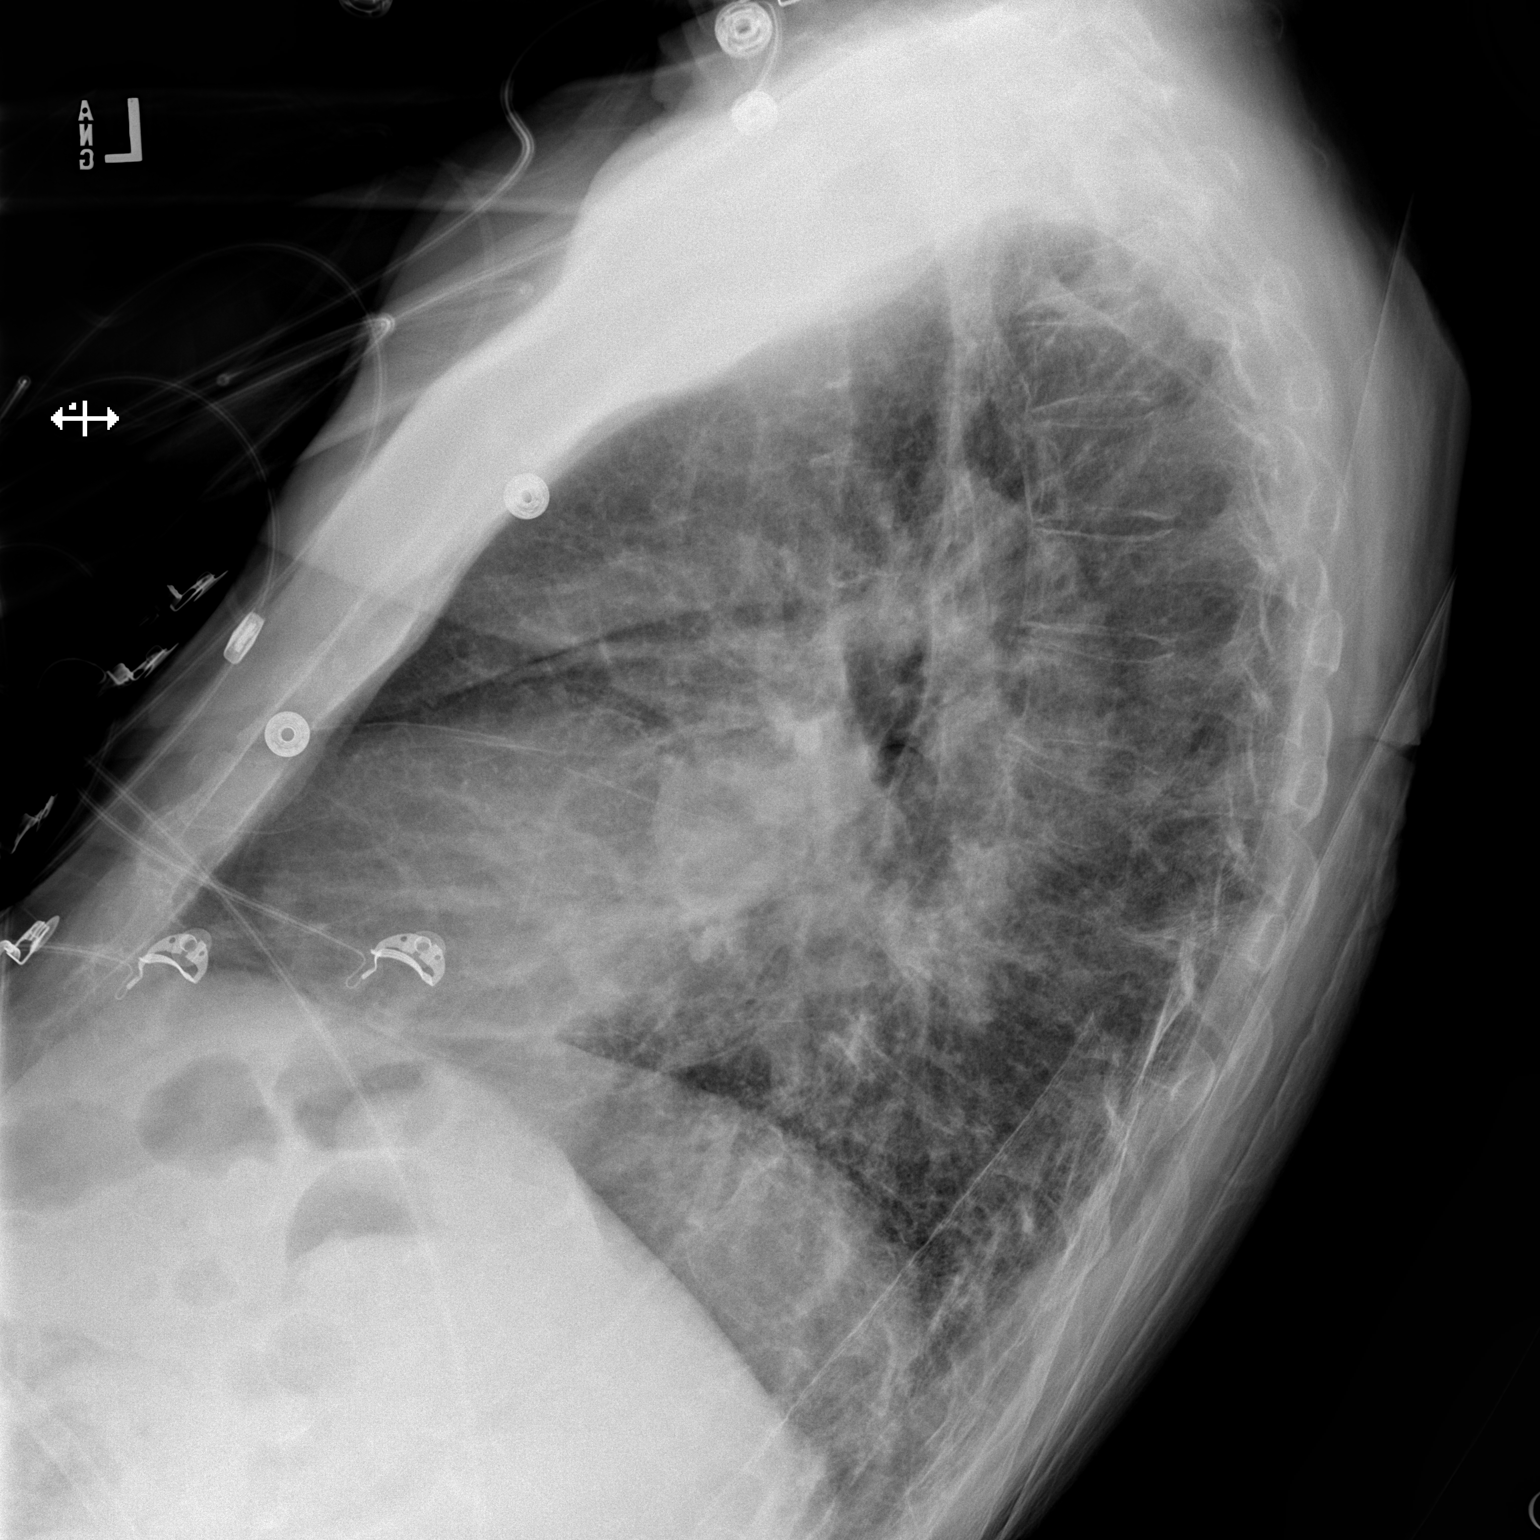

[x chest ap]
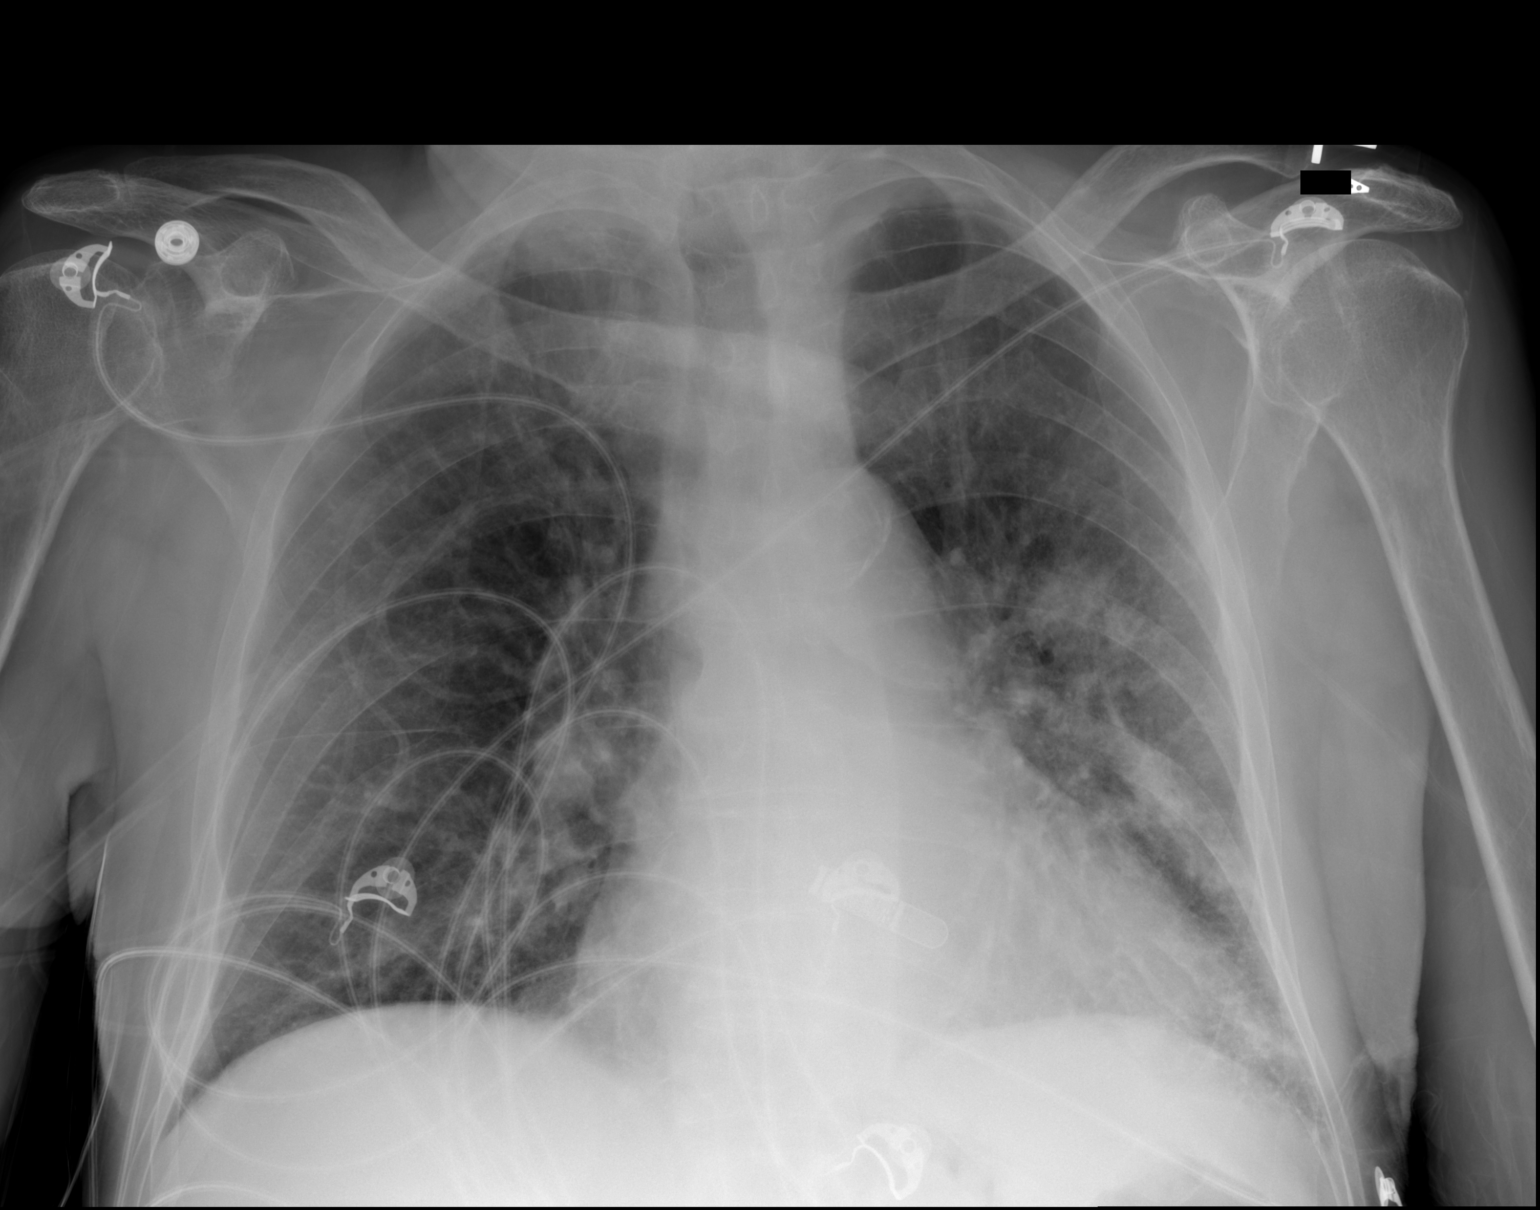

[2 of 2 positions shown; findings below may reference images not displayed]

FINDINGS: The heart size and mediastinal contours are stable. Consolidation of
the left mid and left lung base are identified. Right lung is clear.
There is no pulmonary edema. The visualized skeletal structures are
unremarkable.
IMPRESSION: Left lung pneumonia.

## 2021-05-14 MED ORDER — ONDANSETRON HCL 4 MG PO TABS: 4.0000 mg | ORAL_TABLET | Freq: Four times a day (QID) | ORAL | Status: DC | PRN

## 2021-05-14 MED ORDER — ALBUTEROL SULFATE (2.5 MG/3ML) 0.083% IN NEBU
2.5000 mg | INHALATION_SOLUTION | Freq: Four times a day (QID) | RESPIRATORY_TRACT | Status: DC
  Administered 2021-05-14: 2.5 mg via RESPIRATORY_TRACT
  Filled 2021-05-14: qty 3

## 2021-05-14 MED ORDER — VANCOMYCIN HCL 1500 MG/300ML IV SOLN
1500.0000 mg | INTRAVENOUS | Status: DC
  Administered 2021-05-14: 1500 mg via INTRAVENOUS
  Filled 2021-05-14: qty 300

## 2021-05-14 MED ORDER — INSULIN ASPART 100 UNIT/ML IJ SOLN
0.0000 [IU] | Freq: Every day | INTRAMUSCULAR | Status: DC
Start: 2021-05-14 — End: 2021-05-16
  Administered 2021-05-15: 4 [IU] via SUBCUTANEOUS

## 2021-05-14 MED ORDER — SENNOSIDES-DOCUSATE SODIUM 8.6-50 MG PO TABS: 1.0000 | ORAL_TABLET | Freq: Every evening | ORAL | Status: DC | PRN

## 2021-05-14 MED ORDER — FOLIC ACID 1 MG PO TABS
1.0000 mg | ORAL_TABLET | Freq: Every day | ORAL | Status: DC
  Administered 2021-05-15 – 2021-05-16 (×2): 1 mg via ORAL
  Filled 2021-05-14 (×2): qty 1

## 2021-05-14 MED ORDER — INSULIN GLARGINE-YFGN 100 UNIT/ML ~~LOC~~ SOLN
20.0000 [IU] | Freq: Every day | SUBCUTANEOUS | Status: DC
  Administered 2021-05-14 – 2021-05-15 (×2): 20 [IU] via SUBCUTANEOUS
  Filled 2021-05-14 (×3): qty 0.2

## 2021-05-14 MED ORDER — PIPERACILLIN-TAZOBACTAM 3.375 G IVPB
3.3750 g | Freq: Three times a day (TID) | INTRAVENOUS | Status: DC
  Administered 2021-05-14 – 2021-05-16 (×5): 3.375 g via INTRAVENOUS
  Filled 2021-05-14 (×5): qty 50

## 2021-05-14 MED ORDER — LACTATED RINGERS IV SOLN: INTRAVENOUS | Status: DC

## 2021-05-14 MED ORDER — ONDANSETRON HCL 4 MG/2ML IJ SOLN: 4.0000 mg | Freq: Four times a day (QID) | INTRAMUSCULAR | Status: DC | PRN

## 2021-05-14 MED ORDER — ACETAMINOPHEN 650 MG RE SUPP
650.0000 mg | Freq: Once | RECTAL | Status: AC
  Administered 2021-05-14: 650 mg via RECTAL
  Filled 2021-05-14: qty 1

## 2021-05-14 MED ORDER — SODIUM CHLORIDE 0.9 % IV BOLUS (SEPSIS)
500.0000 mL | Freq: Once | INTRAVENOUS | Status: AC
  Administered 2021-05-14: 500 mL via INTRAVENOUS

## 2021-05-14 MED ORDER — VITAMIN B-12 5000 MCG SL SUBL: 5000.0000 ug | SUBLINGUAL_TABLET | Freq: Every morning | SUBLINGUAL | Status: DC

## 2021-05-14 MED ORDER — HALOPERIDOL LACTATE 5 MG/ML IJ SOLN: 1.0000 mg | Freq: Four times a day (QID) | INTRAMUSCULAR | Status: DC | PRN

## 2021-05-14 MED ORDER — ALBUTEROL SULFATE (2.5 MG/3ML) 0.083% IN NEBU
2.5000 mg | INHALATION_SOLUTION | Freq: Three times a day (TID) | RESPIRATORY_TRACT | Status: DC
  Administered 2021-05-15: 2.5 mg via RESPIRATORY_TRACT
  Filled 2021-05-14: qty 3

## 2021-05-14 MED ORDER — ACETAMINOPHEN 325 MG PO TABS
650.0000 mg | ORAL_TABLET | Freq: Two times a day (BID) | ORAL | Status: DC | PRN
  Filled 2021-05-14: qty 2

## 2021-05-14 MED ORDER — SODIUM CHLORIDE 0.9% FLUSH
3.0000 mL | Freq: Two times a day (BID) | INTRAVENOUS | Status: DC
  Administered 2021-05-14 – 2021-05-16 (×4): 3 mL via INTRAVENOUS

## 2021-05-14 MED ORDER — BUDESONIDE 3 MG PO CPEP
9.0000 mg | ORAL_CAPSULE | Freq: Every day | ORAL | Status: DC
  Administered 2021-05-15 – 2021-05-16 (×2): 9 mg via ORAL
  Filled 2021-05-14 (×2): qty 3
  Filled 2021-05-14: qty 1

## 2021-05-14 MED ORDER — VITAMIN B-12 1000 MCG PO TABS
5000.0000 ug | ORAL_TABLET | Freq: Every day | ORAL | Status: DC
  Administered 2021-05-15 – 2021-05-16 (×2): 5000 ug via ORAL
  Filled 2021-05-14 (×2): qty 5

## 2021-05-14 MED ORDER — ASPIRIN EC 325 MG PO TBEC
325.0000 mg | DELAYED_RELEASE_TABLET | ORAL | Status: DC
  Administered 2021-05-16: 325 mg via ORAL
  Filled 2021-05-14: qty 1

## 2021-05-14 MED ORDER — GUAIFENESIN ER 600 MG PO TB12
600.0000 mg | ORAL_TABLET | Freq: Two times a day (BID) | ORAL | Status: DC
  Administered 2021-05-14 – 2021-05-16 (×4): 600 mg via ORAL
  Filled 2021-05-14 (×4): qty 1

## 2021-05-14 MED ORDER — OXYBUTYNIN CHLORIDE 5 MG PO TABS
5.0000 mg | ORAL_TABLET | Freq: Every day | ORAL | Status: DC
  Administered 2021-05-15 – 2021-05-16 (×2): 5 mg via ORAL
  Filled 2021-05-14 (×2): qty 1

## 2021-05-14 MED ORDER — ENOXAPARIN SODIUM 40 MG/0.4ML IJ SOSY
40.0000 mg | PREFILLED_SYRINGE | INTRAMUSCULAR | Status: DC
  Administered 2021-05-14 – 2021-05-15 (×2): 40 mg via SUBCUTANEOUS
  Filled 2021-05-14 (×2): qty 0.4

## 2021-05-14 MED ORDER — ASPIRIN EC 325 MG PO TBEC: 325.0000 mg | DELAYED_RELEASE_TABLET | ORAL | Status: DC

## 2021-05-14 MED ORDER — AMIODARONE HCL 200 MG PO TABS
200.0000 mg | ORAL_TABLET | Freq: Every day | ORAL | Status: DC
  Administered 2021-05-15 – 2021-05-16 (×2): 200 mg via ORAL
  Filled 2021-05-14 (×2): qty 1

## 2021-05-14 MED ORDER — SODIUM CHLORIDE 0.9 % IV SOLN
2.0000 g | INTRAVENOUS | Status: DC
  Administered 2021-05-14: 2 g via INTRAVENOUS
  Filled 2021-05-14: qty 20

## 2021-05-14 MED ORDER — PANTOPRAZOLE SODIUM 40 MG PO TBEC
40.0000 mg | DELAYED_RELEASE_TABLET | Freq: Every day | ORAL | Status: DC
  Administered 2021-05-14 – 2021-05-16 (×3): 40 mg via ORAL
  Filled 2021-05-14 (×3): qty 1

## 2021-05-14 MED ORDER — METOPROLOL TARTRATE 50 MG PO TABS
50.0000 mg | ORAL_TABLET | Freq: Two times a day (BID) | ORAL | Status: DC
  Administered 2021-05-14 – 2021-05-16 (×4): 50 mg via ORAL
  Filled 2021-05-14 (×4): qty 1

## 2021-05-14 MED ORDER — AZITHROMYCIN 500 MG IV SOLR
500.0000 mg | INTRAVENOUS | Status: DC
  Administered 2021-05-14: 500 mg via INTRAVENOUS
  Filled 2021-05-14: qty 500

## 2021-05-14 MED ORDER — INSULIN ASPART 100 UNIT/ML IJ SOLN
0.0000 [IU] | Freq: Three times a day (TID) | INTRAMUSCULAR | Status: DC
  Administered 2021-05-15 (×2): 5 [IU] via SUBCUTANEOUS
  Administered 2021-05-15 – 2021-05-16 (×2): 2 [IU] via SUBCUTANEOUS

## 2021-05-14 NOTE — ED Triage Notes (Signed)
BIB EMS from home, family reports AMS and incontinence since this morning, getting trt for UTI currently. At baseline patient is alert and oriented x4, is only alert to person.   EMS  160/70 BP HR 80 RR 40 O2 97% 2 L [place], was 85% on Ra w/ Ems CBG 277 T 103.7 tympanic

## 2021-05-14 NOTE — ED Notes (Signed)
Attempted x6 for IV access w/ this RN and Agricultural consultant. One was successful, got minimal blood. Not able to obtain blood cultures or second line.

## 2021-05-14 NOTE — ED Provider Notes (Signed)
Emergency Department Provider Note   I have reviewed the triage vital signs and the nursing notes.   HISTORY  Chief Complaint Altered Mental Status   HPI Tommy Peterson is a 80 y.o. male with PMH reviewed below including leukoemia, a-fib, and prior GI bleeding not on anticoagulation presents to the ED with fever and AMS.  Patient's family members at bedside provides most of the history.  She states that patient got home earlier this week after admission with fever and confusion ultimately found to be from a urinary tract infection.  She states he been doing fairly well until this morning when family had not heard from him and went to check on him finding him more confused and hot to touch.  He has been compliant with his home medications including antibiotics.  He was discharged home on Omnicef and his prophylactic doxycycline.   Level 5 caveat: AMS   Past Medical History:  Diagnosis Date   Acute respiratory failure (HCC)    BCC (basal cell carcinoma of skin) 08/15/2016   w/SCC Left Forehead (treatment @ [facility])   BPH (benign prostatic hyperplasia)    Carpal tunnel syndrome on both sides    Chronic diarrhea    intermittant due to chronic collagenous colitis   Chronic large granular lymphocytic leukemia (HCC) primary hemotologist-  dr Judie Petit. 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--  treatment weekly methotraxate and transfusion's (last PRBCs 07-19-2017)   Collagenous colitis    chronic--- intermittant between diarrahea and constipation   ED (erectile dysfunction)    HCAP (healthcare-associated pneumonia)    History of adenomatous polyp of colon    History of Clostridium difficile colitis 05/2016   History of pneumonia 11/08/2017   CAP -- LLL---  12-19-2017  per pt no residual symptoms   History of SCC (squamous cell  carcinoma) of skin    History of sepsis    11-20-2017 severe sepsis due to UTI;  10/ 2017sepsis due to c-diff colitis   Leucocytosis    chronic   Lower urinary tract symptoms (LUTS)    Macrocytic anemia    since 02/ 2015   MGUS (monoclonal gammopathy of unknown significance)    hemotologist-  dr Michelene Heady @ [facility]   Monoclonal paraproteinemia    Neuropathy, peripheral    Nodular basal cell carcinoma (BCC) 10/01/2018   Left Neck(Nodular) Pt Does Not Want Treatment   Nodular basal cell carcinoma (BCC) 10/01/2018   Mid Forehead (treament curet and excision)   OA (osteoarthritis)    Prostatic stone    Rash of face    RIGHT SIDE   Raynaud's syndrome    Recurrent BCC (basal cell carcinoma) 03/05/2019   positive margin   SCC (squamous cell carcinoma) 08/10/2009   Central Forehead (Moh's Dr. Jeanie Sewer @ [facility])   SCC (squamous cell carcinoma) 12/08/2011   CIS-Left Cheek (treatment Aldara @ [facility])   SCC (squamous cell carcinoma) 07/09/2015   CIS-Left Forehead (treament @ [facility])   Septic shock (HCC)    x 3 w/UTI   Squamous cell carcinoma in situ (SCCIS) 12/08/2011   Left Cheek (treatment Aldara @ [facility])    Superficial basal cell carcinoma (BCC) 10/01/2018   Left Shin-Pt does not want treatment   Thin skin    fragile   Transfusion-dependent anemia since 10/ 2017   last transfusion  PRBCs 07-19-2017  per hemologist note dated 10-25-2017   Type 2 diabetes mellitus treated with insulin (HCC)    followed by dr Selena Batten (pcp)   Urinary hesitancy    Wears contact lenses    LEFT EYE ONLY    Patient Active Problem List   Diagnosis Date Noted   Pneumonia 05/14/2021   Acute metabolic encephalopathy 05/07/2021   Acute encephalopathy 05/06/2021   (HFpEF) heart failure with preserved ejection fraction (HCC) 04/03/2021   Type 2 diabetes mellitus with hyperglycemia (HCC) 04/03/2021   Symptomatic anemia 03/22/2021   Severe sepsis (HCC) 12/31/2020   Epididymitis 12/30/2020    Dehydration 12/30/2020   Hyponatremia 12/30/2020   Head injury 12/30/2020   Sepsis secondary to UTI (HCC) 12/09/2020   Frequent falls 12/09/2020   Acute on chronic heart failure with preserved ejection fraction (HFpEF) (HCC) 11/07/2020   Volume overload 11/06/2020   Chronic anticoagulation 10/11/2020   Persistent atrial fibrillation (HCC)    Acute hypoxemic respiratory failure due to COVID-19 (HCC) 04/07/2020   Pressure injury of skin 09/02/2018   UTI (urinary tract infection) 09/01/2018   Sepsis (HCC) 07/28/2018   CAP (community acquired pneumonia) 07/28/2018   Paroxysmal atrial fibrillation (HCC) 04/10/2018   Aortic atherosclerosis (HCC) 04/08/2018   DM type 2 (diabetes mellitus, type 2) (HCC) 02/19/2018   Acute respiratory failure with hypoxia (HCC) 02/18/2018   MGUS (monoclonal gammopathy of unknown significance) 11/23/2017   BPH (benign prostatic hyperplasia) 11/23/2017   Left lower lobe pneumonia 11/20/2017   Nausea vomiting and diarrhea 11/20/2017   Leukemia not having achieved remission (HCC) 12/26/2016   Anemia 12/26/2016   Large granular lymphocytic leukemia (HCC) 12/26/2016   Diabetes mellitus with complication (HCC)    Macrocytic anemia    DOE (dyspnea on exertion)    Anxiety    History of GI bleed    C. difficile colitis 05/31/2016   Lactic acidosis    History of bone marrow biopsy 05/27/2016   Chest pain 05/04/2016   Benign fibroma of prostate 09/20/2015   HLD (hyperlipidemia) 09/20/2015   Essential hypertension 09/20/2015   Arthritis, degenerative 09/20/2015   Colitis, collagenous 09/09/2015   Absolute anemia 09/08/2014   Adenoma of large intestine 07/13/2014   Disturbance of skin sensation 01/09/2014   Recurrent major depressive disorder, in full remission (HCC) 07/18/2012   Allergic rhinitis 08/29/2011    Past Surgical History:  Procedure Laterality Date   BONE MARROW BIOPSY Right 05-24-2016;  08-20-2014   CARDIOVASCULAR STRESS TEST  11-26-2012   dr  Boneta Lucks  @ [facility] ([place])   normal nuclear study w/ no ischemia/  normal LV function and wall motion , ef 60% (find in care everywhere,epic)   CARDIOVERSION N/A 08/31/2020   Procedure: CARDIOVERSION;  Surgeon: Thurmon Fair, MD;  Location: [facility] END[facility];  Service: Cardiovascular;  Laterality: N/A;   CATARACT EXTRACTION W/ INTRAOCULAR LENS  IMPLANT, BILATERAL  2015   CYSTOSCOPY WITH LITHOLAPAXY N/A 12/25/2017   Procedure: CYSTOSCOPY WITH LITHOLAPAXY AND FULGERATION;  Surgeon: Bjorn Pippin, MD;  Location: [facility];  Service: Urology;  Laterality: N/A;   INGUINAL HERNIA REPAIR Right 1980s   LOOP RECORDER INSERTION N/A 04/10/2018   Procedure: LOOP RECORDER INSERTION;  Surgeon: Thurmon Fair, MD;  Location: [facility] INVASIVE CV LAB;  Service: Cardiovascular;  Laterality: N/A;   PENILE PROSTHESIS IMPLANT  12-02-2015   dr Terie Purser @ [facility] in [place]   COLOPLAST   SHOULDER SURGERY Left 08/2016   TEE WITHOUT CARDIOVERSION N/A 08/31/2020  Procedure: TRANSESOPHAGEAL ECHOCARDIOGRAM (TEE);  Surgeon: [name], MD;  Location: [facility];  Service: Cardiovascular;  Laterality: N/A;   TONSILLECTOMY  child   TRANSURETHRAL RESECTION OF PROSTATE  2009   AND REPAIR RECURRENT RIGHT INGUINAL HERNIA    Allergies Bee pollen, Bee venom, Metformin and related, Sulfamethoxazole-trimethoprim, and Metformin hcl  Family History  Problem Relation Age of Onset   Diabetes Mother    Heart failure Mother     Social History Social History   Tobacco Use   Smoking status: Former    Years: 20.00    Types: Cigarettes    Quit date: 05/04/1996    Years since quitting: 25.0   Smokeless tobacco: Never  Vaping Use   Vaping Use: Never used  Substance Use Topics   Alcohol use: Yes    Alcohol/week: 1.0 standard drink    Types: 1 Glasses of wine per week    Comment: 1-2 glasses of wine every other day   Drug use: No    Review of Systems  Level 5 caveat:  AMS  ____________________________________________   PHYSICAL EXAM:  VITAL SIGNS: ED Triage Vitals [05/14/21 1339]  Enc Vitals Group     BP (!) 187/65     Pulse Rate 93     Resp (!) 28     Temp (!) 102.7 F (39.3 C)     Temp Source Rectal     SpO2 97 %   Constitutional: Eyes closed. Will respond to voice but not reliably following commands or participating in the history in a meaningful way.  Eyes: Conjunctivae are normal.  Head: Atraumatic. Nose: No congestion/rhinnorhea. Mouth/Throat: Mucous membranes are dry.  Neck: No stridor.   Cardiovascular: Normal rate, regular rhythm. Good peripheral circulation. Grossly normal heart sounds.   Respiratory: Normal respiratory effort.  No retractions. Lungs CTAB. Gastrointestinal: Soft and nontender. No distention.  Musculoskeletal: No gross deformities of extremities. Neurologic:  Normal speech and language. Spontaneously moving all extremities but not briskly following commands.  Skin:  Skin is warm, dry and intact. No rash noted.  ____________________________________________   LABS (all labs ordered are listed, but only abnormal results are displayed)  Labs Reviewed  COMPREHENSIVE METABOLIC PANEL - Abnormal; Notable for the following components:      Result Value   Sodium 132 (*)    Glucose, Bld 202 (*)    Creatinine, Ser 0.47 (*)    Calcium 8.6 (*)    Total Protein 6.0 (*)    Albumin 3.2 (*)    ALT 54 (*)    Total Bilirubin 1.3 (*)    All other components within normal limits  CBC WITH DIFFERENTIAL/PLATELET - Abnormal; Notable for the following components:   WBC 18.9 (*)    RBC 2.39 (*)    Hemoglobin 7.9 (*)    HCT 23.5 (*)    Neutro Abs 11.4 (*)    Lymphs Abs 5.0 (*)    Monocytes Absolute 2.2 (*)    Abs Immature Granulocytes 0.30 (*)    All other components within normal limits  URINALYSIS, ROUTINE W REFLEX MICROSCOPIC - Abnormal; Notable for the following components:   APPearance CLOUDY (*)    Glucose, UA 50 (*)     Ketones, ur 5 (*)    All other components within normal limits  COMPREHENSIVE METABOLIC PANEL - Abnormal; Notable for the following components:   Potassium 2.7 (*)    Glucose, Bld 141 (*)    Creatinine, Ser 0.39 (*)    Calcium 8.3 (*)  Total Protein 4.8 (*)    Albumin 2.5 (*)    All other components within normal limits  GLUCOSE, CAPILLARY - Abnormal; Notable for the following components:   Glucose-Capillary 178 (*)    All other components within normal limits  CBC - Abnormal; Notable for the following components:   WBC 10.6 (*)    RBC 2.10 (*)    Hemoglobin 6.3 (*)    HCT 18.7 (*)    All other components within normal limits  GLUCOSE, CAPILLARY - Abnormal; Notable for the following components:   Glucose-Capillary 162 (*)    All other components within normal limits  GLUCOSE, CAPILLARY - Abnormal; Notable for the following components:   Glucose-Capillary 274 (*)    All other components within normal limits  CBG MONITORING, ED - Abnormal; Notable for the following components:   Glucose-Capillary 194 (*)    All other components within normal limits  CULTURE, BLOOD (ROUTINE X 2)  RESP PANEL BY RT-PCR (FLU A&B, COVID) ARPGX2  MRSA NEXT GEN BY PCR, NASAL  LACTIC ACID, PLASMA  LACTIC ACID, PLASMA  PROTIME-INR  STREP PNEUMONIAE URINARY ANTIGEN  MAGNESIUM  PHOSPHORUS  LEGIONELLA PNEUMOPHILA SEROGP 1 UR AG  TYPE AND SCREEN  PREPARE RBC (CROSSMATCH)   ____________________________________________  EKG  EKG reviewed.   ____________________________________________  RADIOLOGY  DG Chest 2 View  Result Date: 05/14/2021 CLINICAL DATA:  Sepsis EXAM: CHEST - 2 VIEW COMPARISON:  May 06, 2021 FINDINGS: The heart size and mediastinal contours are stable. Consolidation of the left mid and left lung base are identified. Right lung is clear. There is no pulmonary edema. The visualized skeletal structures are unremarkable. IMPRESSION: Left lung pneumonia. Electronically Signed    By: Sherian Rein M.D.   On: 05/14/2021 14:24    ____________________________________________   PROCEDURES  Procedure(s) performed:   Procedures  CRITICAL CARE Performed by: Maia Plan Total critical care time: 35 minutes Critical care time was exclusive of separately billable procedures and treating other patients. Critical care was necessary to treat or prevent imminent or life-threatening deterioration. Critical care was time spent personally by me on the following activities: development of treatment plan with patient and/or surrogate as well as nursing, discussions with consultants, evaluation of patient's response to treatment, examination of patient, obtaining history from patient or surrogate, ordering and performing treatments and interventions, ordering and review of laboratory studies, ordering and review of radiographic studies, pulse oximetry and re-evaluation of patient's condition.  Alona Bene, MD Emergency Medicine  ____________________________________________   INITIAL IMPRESSION / ASSESSMENT AND PLAN / ED COURSE  Pertinent labs & imaging results that were available during my care of the patient were reviewed by me and considered in my medical decision making (see chart for details).   Patient presents the emergency department with fever and encephalopathy.  He is currently undergoing treatment for Klebsiella UTI.  Oriented to person only.  Not currently requiring oxygen in the emergency department.  He is febrile but not hypotensive.  Chest x-ray shows pneumonia and respiratory rate is up slightly but patient is not requiring advanced airway or BiPAP at this time. Will cover with abx and IVF with sepsis protocol and   PNA appreciated on chest x-ray.  COVID and flu are negative.  Patient with a white count of almost 19 but normal lactic acid.  Question possibly early/developing sepsis.  Patient is encephalopathic related to this infection.  Updated family at  bedside with plan for antibiotics and admission to the hospital.  He is requiring 2 L nasal cannula but is tolerating this well and does not have significant increased work of breathing.  Discussed patient's case with [facility]  to request admission. Patient and family (if present) updated with plan. Care transferred to [facility] service.  I reviewed all nursing notes, vitals, pertinent old records, EKGs, labs, imaging (as available).  ____________________________________________  FINAL CLINICAL IMPRESSION(S) / ED DIAGNOSES  Final diagnoses:  Sepsis, due to unspecified organism, unspecified whether acute organ dysfunction present ([place])  Community acquired pneumonia, unspecified laterality     MEDICATIONS GIVEN DURING THIS VISIT:  Medications  acetaminophen (TYLENOL) tablet 650 mg (has no administration in time range)  amiodarone (PACERONE) tablet 200 mg (200 mg Oral Given 05/15/21 0916)  metoprolol tartrate (LOPRESSOR) tablet 50 mg (50 mg Oral Given 05/15/21 0916)  budesonide (ENTOCORT EC) 24 hr capsule 9 mg (9 mg Oral Given 05/15/21 1152)  pantoprazole (PROTONIX) EC tablet 40 mg (40 mg Oral Given 05/15/21 0916)  oxybutynin (DITROPAN) tablet 5 mg (5 mg Oral Given 82/9/56 2130)  folic acid (FOLVITE) tablet 1 mg (1 mg Oral Given 05/15/21 0916)  enoxaparin (LOVENOX) injection 40 mg (40 mg Subcutaneous Given 05/14/21 2143)  sodium chloride flush (NS) 0.9 % injection 3 mL (3 mLs Intravenous Given 05/15/21 0918)  senna-docusate (Senokot-S) tablet 1 tablet (has no administration in time range)  ondansetron (ZOFRAN) tablet 4 mg (has no administration in time range)    Or  ondansetron (ZOFRAN) injection 4 mg (has no administration in time range)  guaiFENesin (MUCINEX) 12 hr tablet 600 mg (600 mg Oral Given 05/15/21 0917)  haloperidol lactate (HALDOL) injection 1 mg (has no administration in time range)  insulin aspart (novoLOG) injection 0-9 Units (5 Units Subcutaneous Given 05/15/21 1226)  insulin aspart  (novoLOG) injection 0-5 Units (0 Units Subcutaneous Not Given 05/14/21 2142)  insulin glargine-yfgn (SEMGLEE) injection 20 Units (20 Units Subcutaneous Given 05/14/21 2255)  aspirin EC tablet 325 mg (has no administration in time range)  vitamin B-12 (CYANOCOBALAMIN) tablet 5,000 mcg (5,000 mcg Oral Given 05/15/21 0916)  piperacillin-tazobactam (ZOSYN) IVPB 3.375 g (3.375 g Intravenous New Bag/Given 05/15/21 0527)  potassium chloride 10 mEq in 100 mL IVPB (10 mEq Intravenous New Bag/Given 05/15/21 1333)  albuterol (PROVENTIL) (2.5 MG/3ML) 0.083% nebulizer solution 2.5 mg (has no administration in time range)  albuterol (PROVENTIL) (2.5 MG/3ML) 0.083% nebulizer solution 2.5 mg (has no administration in time range)  sodium chloride 0.9 % bolus 500 mL (0 mLs Intravenous Stopped 05/14/21 1627)  acetaminophen (TYLENOL) suppository 650 mg (650 mg Rectal Given 05/14/21 1529)  magnesium sulfate IVPB 2 g 50 mL (2 g Intravenous New Bag/Given 05/15/21 0922)  potassium chloride SA (KLOR-CON) CR tablet 40 mEq (40 mEq Oral Given 05/15/21 0916)  0.9 %  sodium chloride infusion (Manually program via Guardrails IV Fluids) ( Intravenous New Bag/Given 05/15/21 1209)     Note:  This document was prepared using Dragon voice recognition software and may include unintentional dictation errors.  Nanda Quinton, MD, [facility] Emergency Medicine    Thais Silberstein, [facility], MD 05/15/21 [phone]

## 2021-05-14 NOTE — H&P (Signed)
[facility] History and Physical  Tommy Tommy:[id number] DOB: 1941/04/08 DOA: 05/14/2021  Referring physician: ED  PCP: Tommy Tommy   Patient is coming from: Home  Chief Complaint: Altered mental status and fever  HPI: Tommy Tommy is a 80 y.o. male with past medical history of leukemia, atrial fibrillation not on anticoagulation, history of GI bleed in the past, chronic anemia with frequent transfusion and recent admission for metabolic encephalopathy and UTI who was on [place] as outpatient, recently discharged 4 days back was doing well until this morning when the family checked on him.  He was noted to be confused and had elevated temperature.  Patient denies any cough, chest pain, shortness of breath or fever.  He denies any urinary urgency, frequency or dysuria.  Denies any nausea, vomiting  abdominal pain.  History obtained from the significant other at bedside as well.  Patient's significant other states that patient has been declining in overall health and has been needing help at home.  He does have leukemia not in remission and his overall health has gone down.  ED Course: In the ED, patient arrived with a temperature of 102.7 F, was slightly hypertensive.  He required 2 L of oxygen in the ED.  Flagyl was negative.  Lactate was 1.8.  Sodium was slightly low at 132.  Creatinine of 0.4.  LFTs mildly elevated ALT.  Patient had mild leukocytosis with WBC at 18.9.  Hemoglobin 7.9.  Urine analysis showed no nitrates no leukocytes.  Chest x-ray showed left-sided infiltrate.  Patient was then considered for admission to hospital for possible healthcare associated pneumonia with metabolic encephalopathy.  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--  treatment weekly methotraxate and transfusion's (last PRBCs 07-19-2017)   Collagenous colitis    chronic--- intermittant between diarrahea and constipation   ED (erectile dysfunction)    HCAP (healthcare-associated pneumonia)    History of adenomatous polyp of colon    History of Clostridium difficile colitis 05/2016   History of pneumonia 11/08/2017   CAP -- LLL---  12-19-2017  per pt no residual symptoms   History of SCC (squamous cell carcinoma) of skin    History of sepsis    11-20-2017 severe sepsis due to UTI;  10/ 2017sepsis due to c-diff colitis   Leucocytosis    chronic   Lower urinary tract symptoms (LUTS)    Macrocytic anemia    since 02/ 2015   MGUS (monoclonal gammopathy of unknown significance)    hemotologist-  Tommy Tommy @ [facility]   Monoclonal paraproteinemia    Neuropathy, peripheral    Nodular basal cell carcinoma (BCC) 10/01/2018   Left Neck(Nodular) Pt Does Not Want Treatment   Nodular basal cell carcinoma (BCC) 10/01/2018   Mid Forehead (treament curet and excision)   OA (osteoarthritis)  Prostatic stone    Rash of face    RIGHT SIDE   Raynaud's syndrome    Recurrent BCC (basal cell carcinoma) 03/05/2019   positive margin   SCC (squamous cell carcinoma) 08/10/2009   Central Forehead (Moh's Tommy. Rhona Peterson @ [facility])   SCC (squamous cell carcinoma) 12/08/2011   CIS-Left Cheek ([facility])   SCC (squamous cell carcinoma) 07/09/2015   CIS-Left Forehead (treament @ [facility])   Septic shock ([place])    x 3 w/UTI    Squamous cell carcinoma in situ (SCCIS) 12/08/2011   Left Cheek (treatment [facility] @ [facility])    Superficial basal cell carcinoma ([place]) 10/01/2018   Left Shin-Pt does not want treatment   Thin skin    fragile   Transfusion-dependent anemia since 10/ 2017   last transfusion PRBCs 07-19-2017  per hemologist note dated 10-25-2017   Type 2 diabetes mellitus treated with insulin ([place])    followed by Tommy Maudie Mercury (pcp)   Urinary hesitancy    Wears contact lenses    LEFT EYE ONLY   Past Surgical History:  Procedure Laterality Date   BONE MARROW BIOPSY Right 05-24-2016;  08-20-2014   CARDIOVASCULAR STRESS TEST  11-26-2012   Tommy Tommy  @ [place] ([place]   normal nuclear study w/ no ischemia/  normal LV function and wall motion , ef 60% (find in care everywhere,epic)   CARDIOVERSION N/A 08/31/2020   Procedure: CARDIOVERSION;  Surgeon: Tommy Tommy;  Location: [place];  Service: Cardiovascular;  Laterality: N/A;   CATARACT EXTRACTION W/ INTRAOCULAR LENS  IMPLANT, BILATERAL  2015   CYSTOSCOPY WITH LITHOLAPAXY N/A 12/25/2017   Procedure: CYSTOSCOPY WITH LITHOLAPAXY AND FULGERATION;  Surgeon: Tommy Tommy;  Location: [place];  Service: Urology;  Laterality: N/A;   INGUINAL HERNIA REPAIR Right 1980s   LOOP RECORDER INSERTION N/A 04/10/2018   Procedure: LOOP RECORDER INSERTION;  Surgeon: Tommy Tommy;  Location: [place] [facility];  Service: Cardiovascular;  Laterality: N/A;   PENILE PROSTHESIS IMPLANT  12-02-2015   Tommy Tommy Tommy @ [place] in [place] Left 08/2016   TEE WITHOUT CARDIOVERSION N/A 08/31/2020   Procedure: TRANSESOPHAGEAL ECHOCARDIOGRAM (TEE);  Surgeon: Tommy Tommy;  Location: [facility] ENDOSCOPY;  Service: Cardiovascular;  Laterality: N/A;   TONSILLECTOMY  child   TRANSURETHRAL RESECTION OF PROSTATE  2009   AND REPAIR RECURRENT RIGHT INGUINAL HERNIA    Social History:  reports that he quit smoking  about 25 years ago. His smoking use included cigarettes. He has never used smokeless tobacco. He reports current alcohol use of about 1.0 standard drink per week. He reports that he does not use drugs.  Allergies  Allergen Reactions   Bee Pollen Anaphylaxis and Swelling   Bee Venom Swelling and Anaphylaxis   Metformin And Related Nausea And Vomiting and Other (See Comments)    Made the patient feel sick   Sulfamethoxazole-Trimethoprim Nausea And Vomiting   Metformin Hcl Nausea And Vomiting and Other (See Comments)    Made the patient feel sick     Family History  Problem Relation Age of Onset   Diabetes Mother    Heart failure Mother      Prior to Admission medications   Medication Sig Start Date End Date Taking? Authorizing Provider  acetaminophen (TYLENOL) 325 MG tablet Take 650 mg by mouth 2 (two) times daily as needed for fever.   Yes Provider, Historical,  Peterson  amiodarone (PACERONE) 200 MG tablet Take 1 tablet (200 mg total) by mouth daily. Patient taking differently: Take 200 mg by mouth in the morning. 09/29/20  Yes [facility], Tami Lin, PA  aspirin 325 MG EC tablet Take 325 mg by mouth every Monday, Wednesday, and Friday.   Yes Provider, Historical, Peterson  B Complex Vitamins (B COMPLEX 100 PO) Take 1 tablet by mouth 2 (two) times daily.   Yes Provider, Historical, Peterson  budesonide (ENTOCORT EC) 3 MG 24 hr capsule Take 9 mg by mouth in the morning.   Yes Provider, Historical, Peterson  cefdinir (OMNICEF) 300 MG capsule Take 1 capsule (300 mg total) by mouth 2 (two) times daily for 5 days. 05/10/21 05/15/21 Yes , , Peterson  Chromium-Cinnamon (CINNAMON PLUS CHROMIUM PO) Take 2 tablets by mouth in the morning and at bedtime.   Yes Provider, Historical, Peterson  Continuous Blood Gluc Sensor (FREESTYLE LIBRE 2 SENSOR) MISC Inject 1 patch into the skin every 14 (fourteen) days. 03/17/21  Yes Provider, Historical, Peterson  Cranberry 400 MG TABS Take 400 mg by mouth 2 (two) times daily.   Yes Provider,  Historical, Peterson  Cyanocobalamin (VITAMIN B-12) 5000 MCG SUBL Place 5,000 mcg under the tongue in the morning.   Yes Provider, Historical, Peterson  cyclophosphamide (CYTOXAN) 50 MG capsule Take 1 capsule (50 mg total) by mouth daily. Take with food to minimize GI upset. Take early in the day and maintain hydration. 04/25/21  Yes Brunetta Genera, Peterson  D-Mannose 500 MG CAPS Take 500 mg by mouth in the morning and at bedtime.   Yes Provider, Historical, Peterson  folic acid (FOLVITE) 1 MG tablet TAKE 1 TABLET BY MOUTH EVERY DAY Patient taking differently: Take 1 mg by mouth daily with breakfast. 07/23/20  Yes Brunetta Genera, Peterson  metoprolol tartrate (LOPRESSOR) 50 MG tablet Take 50 mg by mouth in the morning and at bedtime.   Yes Provider, Historical, Peterson  Misc Natural Products (PROSTATE HEALTH) CAPS Take 1 capsule by mouth daily with breakfast.   Yes Provider, Historical, Peterson  NOVOLOG FLEXPEN 100 UNIT/ML FlexPen Inject 1-15 Units into the skin 3 (three) times daily with meals. Glucose 121 - 150: 2 units, Glucose 151 - 200: 3 units, Glucose 201 - 250: 5 units, Glucose 251 - 300: 8 units, Glucose 301 - 350: 11 units, Glucose 351 - 400: 15 units, Glucose > 400 call Peterson Patient taking differently: Inject 1-15 Units into the skin 3 (three) times daily with meals. Inject 1-15 units into the skin three times a day with meals: BGL 121-150 = 2 units; 151-200 = 3 units; 201-250 = 5 units; 251-300 = 8 units; 301-350 = 11 units; 351-400 = 15 units; >400 = call Peterson 04/07/21  Yes Dwyane Dee, Peterson  omeprazole (PRILOSEC) 40 MG capsule Take 40 mg by mouth 2 (two) times daily.   Yes Provider, Historical, Peterson  oxybutynin (DITROPAN) 5 MG tablet Take 5 mg by mouth in the morning.   Yes Provider, Historical, Peterson  pioglitazone (ACTOS) 15 MG tablet Take 15 mg by mouth every morning. 10/18/17  Yes Provider, Historical, Peterson  [place] 100-33 UNT-MCG/ML SOPN Inject 23 Units into the skin at bedtime.   Yes Provider, Historical, Peterson  BD PEN NEEDLE  NANO 2ND GEN 32G X 4 MM MISC  07/22/20   Provider, Historical, Peterson  doxycycline (VIBRAMYCIN) 100 MG capsule Take 1 capsule (100 mg total) by mouth at bedtime. Patient not taking: No sig reported 05/16/21  Flora Lipps, Peterson    Physical Exam: Vitals:   05/14/21 1500 05/14/21 1512 05/14/21 1530 05/14/21 1600  BP: (!) 169/58  (!) 158/55 (!) 154/59  Pulse:   97 92  Resp: (!) 26  (!) 32 (!) 26  Temp:      TempSrc:      SpO2:   96% 94%  Weight:  71 kg    Height:  5' 10" (1.778 m)     Wt Readings from Last 3 Encounters:  05/14/21 71 kg  05/06/21 70.8 kg  04/08/21 71.4 kg   Body mass index is 22.46 kg/m.  General:  Average built, not in obvious distress, appears confused and mildly agitated, elderly male HENT: Normocephalic, pupils equally reacting to light and accommodation.  No scleral pallor or icterus noted. Oral mucosa is dry. Chest:    Diminished breath sounds bilaterally. No crackles or wheezes.  CVS: S1 &S2 heard. No murmur.  Regular rate and rhythm. Abdomen: Soft, nontender, nondistended.  Bowel sounds are heard.  Liver is not palpable, no abdominal mass palpated Extremities: No cyanosis, clubbing or edema.  Peripheral pulses are palpable. Psych: Alert, awake but appears confused and mildly agitated, oriented to self and person. CNS:  No cranial nerve deficits.  Moving all extremities. Skin: Warm and dry.  No rashes noted.  Labs on Admission:   CBC: Recent Labs  Lab 05/08/21 0652 05/09/21 1258 05/14/21 1408  WBC 14.0* 10.2 18.9*  NEUTROABS 7.5  --  11.4*  HGB 7.5* 8.8* 7.9*  HCT 22.6* 26.5* 23.5*  MCV 89.7 90.4 98.3  PLT 204 208 229    Basic Metabolic Panel: Recent Labs  Lab 05/08/21 0652 05/09/21 1110 05/10/21 0448 05/14/21 1408  NA 137 138 137 132*  K 2.7* 3.4* 3.5 3.8  CL 101 102 104 99  CO2 _0 GLUCOSE 147* 278* 177* 202*  BUN 6* 7* 13 13  CREATININE 0.38* 0.46* <0.30* 0.47*  CALCIUM 8.4* 8.6* 8.3* 8.6*  MG 1.7 1.9  --   --     Liver  Function Tests: Recent Labs  Lab 05/14/21 1408  AST 23  ALT 54*  ALKPHOS 93  BILITOT 1.3*  PROT 6.0*  ALBUMIN 3.2*   No results for input(s): LIPASE, AMYLASE in the last 168 hours. No results for input(s): AMMONIA in the last 168 hours.  Cardiac Enzymes: No results for input(s): CKTOTAL, CKMB, CKMBINDEX, TROPONINI in the last 168 hours.  BNP (last 3 results) Recent Labs    11/06/20 0450  BNP 389.9*    ProBNP (last 3 results) No results for input(s): PROBNP in the last 8760 hours.  CBG: Recent Labs  Lab 05/09/21 2041 05/10/21 0005 05/10/21 0410 05/10/21 0716 05/14/21 1338  GLUCAP 335* 239* 170* 196* 194*    Lipase     Component Value Date/Time   LIPASE 28 11/08/2017 1017     Urinalysis    Component Value Date/Time   COLORURINE YELLOW 05/14/2021 1358   APPEARANCEUR CLOUDY (A) 05/14/2021 1358   LABSPEC 1.013 05/14/2021 1358   PHURINE 7.0 05/14/2021 1358   GLUCOSEU 50 (A) 05/14/2021 1358   HGBUR NEGATIVE 05/14/2021 [place] 05/14/2021 1358   BILIRUBINUR negative 10/29/2017 1428   KETONESUR 5 (A) 05/14/2021 1358   PROTEINUR NEGATIVE 05/14/2021 1358   UROBILINOGEN 0.2 10/29/2017 1428   NITRITE NEGATIVE 05/14/2021 1358   LEUKOCYTESUR NEGATIVE 05/14/2021 1358     Drugs of Abuse     Component Value Date/Time  LABOPIA NONE DETECTED 05/27/2016 1650   COCAINSCRNUR NONE DETECTED 05/27/2016 1650   LABBENZ NONE DETECTED 05/27/2016 1650   AMPHETMU NONE DETECTED 05/27/2016 1650   THCU NONE DETECTED 05/27/2016 1650   LABBARB NONE DETECTED 05/27/2016 1650      Radiological Exams on Admission: DG Chest 2 View  Result Date: 05/14/2021 CLINICAL DATA:  Sepsis EXAM: CHEST - 2 VIEW COMPARISON:  May 06, 2021 FINDINGS: The heart size and mediastinal contours are stable. Consolidation of the left mid and left lung base are identified. Right lung is clear. There is no pulmonary edema. The visualized skeletal structures are unremarkable.  IMPRESSION: Left lung pneumonia. Electronically Signed   By: Abelardo Diesel M.D.   On: 05/14/2021 14:24    EKG: Not available for review at the time of dictation  Assessment/Plan Principal Problem:   Pneumonia Active Problems:   Essential hypertension   Leukemia not having achieved remission ([place])   DM type 2 (diabetes mellitus, type 2) ([place])   (HFpEF) heart failure with preserved ejection fraction (HCC)   Fever cough left-sided infiltrate in the hypoxia requiring oxygen supplementation secondary to left sided pneumonia.  Patient has immune compromised status.  He was recently admitted to hospital and left 4 to 5 days back.  Patient likely has vascular surgery pneumonia.  Patient received Rocephin and Zithromax in the ED.  Follow blood cultures sent from the ED.  Urinalysis was negative for any leukocytes.  Patient was recently treated with [facility] for Klebsiella UTI.  Patient takes doxycycline prophylactically at home.  We will continue with Zosyn and vancomycin at this time possible healthcare facility pneumonia and immunocompromise status with high risk for serious infection.  Check MRSA swab.  Patient has been having repeated infections and his general health has been declining.  Large granular lymphocytic leukemia transfusion anemia.  Has immunocompromised status.  Patient undergoes PRBC transfusion as outpatient.  Patient was previously on Methotrexate but was recently changed to Cytoxan by oncology.  Not in remission.  Poor prognosis due to repeated infections.  Paroxysmal atrial fibrillation.  On amiodarone and beta-blockers at home.  Off anticoagulation at this time due to history of GI bleed.  Continue amiodarone and metoprolol.  Diabetes mellitus type II.  Latest hemoglobin A1c of 9.8.  On insulin and pioglitazone at home.  Hold oral hypoglycemic agents.  We will continue with the sliding scale insulin Accu-Cheks diabetic diet and insulin regimen while in the hospital.  Denies  deconditioning, debility, overall declining health.  Frail patient with poor oral intake and failure to thrive.  I spoke with the significant other at bedside.  We will get palliative care consultation for goals of care.  Patient would benefit from a hospice level of care on discharge.  We will get a physical therapy assessment while in the hospital.   DVT Prophylaxis: Lovenox subcu  Consultant: None  Code Status: DNR.  Spoke with the patient's significant other at bedside.  Microbiology blood culture sent from the ED  Antibiotics: Zosyn and vancomycin IV.  Family Communication:  Patients' condition and plan of care including tests being ordered have been discussed with the patient and the patient's significant other at bedside who indicate understanding and agree with the plan.  Status is: Inpatient  Dispo: The patient is from: Home              Anticipated d/c is to: Home      Severity of Illness: The appropriate patient status for this patient is INPATIENT. Inpatient  status is judged to be reasonable and necessary in order to provide the required intensity of service to ensure the patient's safety. The patient's presenting symptoms, physical exam findings, and initial radiographic and laboratory data in the context of their chronic comorbidities is felt to place them at high risk for further clinical deterioration. Furthermore, it is not anticipated that the patient will be medically stable for discharge from the hospital within 2 midnights of admission.  I certify that at the point of admission it is my clinical judgment that the patient will require inpatient hospital care spanning beyond 2 midnights from the point of admission due to high intensity of service, high risk for further deterioration and high frequency of surveillance required.  Signed, Flora Lipps, Peterson [facility] 05/14/2021

## 2021-05-14 NOTE — ED Notes (Signed)
Admitting provider at bedside.

## 2021-05-14 NOTE — Progress Notes (Signed)
Pharmacy Antibiotic Note  Tommy Peterson is a 80 y.o. male with large granular lymphocytic leukemia on oral cytoxan PTA, presented to the ED on 05/14/2021 with AMS, fever and incontinence. CXR showed left lung PNA. Pharmacy has been consulted to dose zosyn and vancomycin for PNA.  Plan: -  zosyn 3.375 gm IV q8h (infuse over 4 hrs) - vancomycin 1500 mg IV q24h for est AUC 453 - daily scr ___________________________________  Height: 5\' 10"  (177.8 cm) Weight: 71 kg (156 lb 8.4 oz) IBW/kg (Calculated) : 73  Temp (24hrs), Avg:101.1 F (38.4 C), Min:99.7 F (37.6 C), Max:102.7 F (39.3 C)  Recent Labs  Lab 05/08/21 0652 05/09/21 1110 05/09/21 1258 05/10/21 0448 05/14/21 1408 05/14/21 1519  WBC 14.0*  --  10.2  --  18.9*  --   CREATININE 0.38* 0.46*  --  <0.30* 0.47*  --   LATICACIDVEN  --   --   --   --  1.5 1.8    Estimated Creatinine Clearance: 72.7 mL/min (A) (by C-G formula based on SCr of 0.47 mg/dL (L)).    Allergies  Allergen Reactions   Bee Pollen Anaphylaxis and Swelling   Bee Venom Swelling and Anaphylaxis   Metformin And Related Nausea And Vomiting and Other (See Comments)    Made the patient feel sick   Sulfamethoxazole-Trimethoprim Nausea And Vomiting   Metformin Hcl Nausea And Vomiting and Other (See Comments)    Made the patient feel sick      Thank you for allowing pharmacy to be a part of this patient's care.  Lynelle Doctor 05/14/2021 8:30 PM

## 2021-05-15 DIAGNOSIS — E119 Type 2 diabetes mellitus without complications: Secondary | ICD-10-CM

## 2021-05-15 DIAGNOSIS — J189 Pneumonia, unspecified organism: Secondary | ICD-10-CM | POA: Diagnosis not present

## 2021-05-15 DIAGNOSIS — I5032 Chronic diastolic (congestive) heart failure: Secondary | ICD-10-CM | POA: Diagnosis not present

## 2021-05-15 DIAGNOSIS — I10 Essential (primary) hypertension: Secondary | ICD-10-CM | POA: Diagnosis not present

## 2021-05-15 DIAGNOSIS — C9590 Leukemia, unspecified not having achieved remission: Secondary | ICD-10-CM

## 2021-05-15 DIAGNOSIS — Z794 Long term (current) use of insulin: Secondary | ICD-10-CM

## 2021-05-15 LAB — COMPREHENSIVE METABOLIC PANEL
ALT: 43 U/L (ref 0–44)
AST: 21 U/L (ref 15–41)
Albumin: 2.5 g/dL — ABNORMAL LOW (ref 3.5–5.0)
Alkaline Phosphatase: 69 U/L (ref 38–126)
Anion gap: 7 (ref 5–15)
BUN: 8 mg/dL (ref 8–23)
CO2: 26 mmol/L (ref 22–32)
Calcium: 8.3 mg/dL — ABNORMAL LOW (ref 8.9–10.3)
Chloride: 104 mmol/L (ref 98–111)
Creatinine, Ser: 0.39 mg/dL — ABNORMAL LOW (ref 0.61–1.24)
GFR, Estimated: >60 mL/min (ref >60)
Glucose, Bld: 141 mg/dL — ABNORMAL HIGH (ref 70–99)
Potassium: 2.7 mmol/L — CL (ref 3.5–5.1)
Sodium: 137 mmol/L (ref 135–145)
Total Bilirubin: 0.6 mg/dL (ref 0.3–1.2)
Total Protein: 4.8 g/dL — ABNORMAL LOW (ref 6.5–8.1)

## 2021-05-15 LAB — CBC
HCT: 18.7 % — ABNORMAL LOW (ref 39.0–52.0)
Hemoglobin: 6.3 g/dL — CL (ref 13.0–17.0)
MCH: 30 pg (ref 26.0–34.0)
MCHC: 33.7 g/dL (ref 30.0–36.0)
MCV: 89 fL (ref 80.0–100.0)
Platelets: 262 10*3/uL (ref 150–400)
RBC: 2.1 MIL/uL — ABNORMAL LOW (ref 4.22–5.81)
RDW: 14.9 % (ref 11.5–15.5)
WBC: 10.6 10*3/uL — ABNORMAL HIGH (ref 4.0–10.5)
nRBC: 0 % (ref 0.0–0.2)

## 2021-05-15 LAB — MAGNESIUM: Magnesium: 1.8 mg/dL (ref 1.7–2.4)

## 2021-05-15 LAB — GLUCOSE, CAPILLARY
Glucose-Capillary: 162 mg/dL — ABNORMAL HIGH (ref 70–99)
Glucose-Capillary: 274 mg/dL — ABNORMAL HIGH (ref 70–99)
Glucose-Capillary: 292 mg/dL — ABNORMAL HIGH (ref 70–99)
Glucose-Capillary: 325 mg/dL — ABNORMAL HIGH (ref 70–99)

## 2021-05-15 LAB — STREP PNEUMONIAE URINARY ANTIGEN: Strep Pneumo Urinary Antigen: NEGATIVE

## 2021-05-15 LAB — MRSA NEXT GEN BY PCR, NASAL: MRSA by PCR Next Gen: NOT DETECTED

## 2021-05-15 LAB — PREPARE RBC (CROSSMATCH)

## 2021-05-15 LAB — PHOSPHORUS: Phosphorus: 3.1 mg/dL (ref 2.5–4.6)

## 2021-05-15 MED ORDER — ALBUTEROL SULFATE (2.5 MG/3ML) 0.083% IN NEBU: 2.5000 mg | INHALATION_SOLUTION | Freq: Four times a day (QID) | RESPIRATORY_TRACT | Status: DC | PRN

## 2021-05-15 MED ORDER — POTASSIUM CHLORIDE 10 MEQ/100ML IV SOLN
10.0000 meq | INTRAVENOUS | Status: AC
  Administered 2021-05-15 (×4): 10 meq via INTRAVENOUS
  Filled 2021-05-15 (×3): qty 100

## 2021-05-15 MED ORDER — ALBUTEROL SULFATE (2.5 MG/3ML) 0.083% IN NEBU
2.5000 mg | INHALATION_SOLUTION | Freq: Two times a day (BID) | RESPIRATORY_TRACT | Status: DC
  Administered 2021-05-15: 2.5 mg via RESPIRATORY_TRACT
  Filled 2021-05-15: qty 3

## 2021-05-15 MED ORDER — MAGNESIUM SULFATE 2 GM/50ML IV SOLN
2.0000 g | Freq: Once | INTRAVENOUS | Status: AC
  Administered 2021-05-15: 2 g via INTRAVENOUS
  Filled 2021-05-15: qty 50

## 2021-05-15 MED ORDER — POTASSIUM CHLORIDE CRYS ER 20 MEQ PO TBCR
40.0000 meq | EXTENDED_RELEASE_TABLET | Freq: Once | ORAL | Status: AC
  Administered 2021-05-15: 40 meq via ORAL
  Filled 2021-05-15: qty 2

## 2021-05-15 MED ORDER — SODIUM CHLORIDE 0.9% IV SOLUTION: Freq: Once | INTRAVENOUS | Status: AC

## 2021-05-15 NOTE — Progress Notes (Addendum)
PROGRESS NOTE  Tommy Peterson ZHY:[id number] DOB: Feb 27, 1941 DOA: 05/14/2021 PCP: Tommy Morning, DO   LOS: 1 day   Brief narrative: Tommy Peterson is a 80 y.o. male with past medical history of leukemia, atrial fibrillation not on anticoagulation, history of GI bleed in the past, chronic anemia with frequent transfusion and recent admission for metabolic encephalopathy and UTI who was on [place] as outpatient, recently discharged 4 days back presented to the hospital with confusion and elevated temperature. In the  ED he did have a temperature of 102.7 F and was hypertensive.  He required 2 L of oxygen in the ED.   Lactate was 1.8.  Sodium was slightly low at 132.  Creatinine of 0.4.  LFTs mildly elevated ALT.  Patient had mild leukocytosis with WBC at 18.9.  Hemoglobin 7.9.  Urine analysis showed no nitrates no leukocytes.  Chest x-ray showed left-sided infiltrate.  Patient was then considered for admission to hospital for possible healthcare associated pneumonia with metabolic encephalopathy.  Assessment/Plan:  Principal Problem:   Pneumonia Active Problems:   Essential hypertension   Leukemia not having achieved remission (Tommy Peterson)   DM type 2 (diabetes mellitus, type 2) (Tommy Peterson)   (HFpEF) heart failure with preserved ejection fraction (HCC)  Left-sided pneumonia.  On vancomycin and Zosyn due to recent hospitalization and concern for healthcare history pneumonia.  Patient is immunocompromised.  Recently had UTI and was treated with IV antibiotics and subsequently Omnicef.  Follow blood cultures.  Temperature max of 102 F.   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  Patient gets transfusions every 2 weeks or so as outpatient.  Has immunocompromised status.  Patient undergoes PRBC transfusion as outpatient.  Patient was previously on Methotrexate but was recently changed to Cytoxan by oncology.  Not in remission.  Poor prognosis due to repeated infections. Hemoglobin of  6.3 and will transfuse II units today. Spoke with patient regarding consent.   Paroxysmal atrial fibrillation.  On amiodarone and beta-blockers at home.  Off anticoagulation at this time due to history of GI bleed.  Continue amiodarone and metoprolol.  Heart rate is controlled at this time.   Diabetes mellitus type II.  Latest hemoglobin A1c of 9.8.  On insulin and pioglitazone at home.  We will continue to hold oral hypoglycemic agents.  We will continue with the sliding scale insulin Accu-Cheks diabetic diet and insulin regimen while in the hospital.  Latest POC glucose of 162   Denies deconditioning, debility, overall declining health.   Frail patient with poor oral intake and failure to thrive.  We will get physical therapy.  Acute metabolic encephalopathy Secondary to underlying infection.  Patient had a lumbar puncture on the last admission which was negative.     Hypokalemia Will replenish aggressively with IV and oral potassium.  We will continue to replenish magnesium as well.   Recently treated Klebsiella UTI.  Was on Omnicef as outpatient.  UA normal range.   DVT prophylaxis: enoxaparin (LOVENOX) injection 40 mg Start: 05/14/21 2200   Code Status: DNR  Family Communication: None   Status is: Inpatient  Remains inpatient appropriate because:Unsafe d/c plan, IV treatments appropriate due to intensity of illness or inability to take PO, and Inpatient level of care appropriate due to severity of illness  Dispo: The patient is from: Home              Anticipated d/c is to:  Undetermined at this time  Patient currently is not medically stable to d/c.   Difficult to place patient No   Consultants: Palliative care  Procedures: None  Anti-infectives:  Vancomycin and Zosyn 10/8>  Anti-infectives (From admission, onward)    Start     Dose/Rate Route Frequency Ordered Stop   05/14/21 2100  vancomycin (VANCOREADY) IVPB 1500 mg/300 mL        1,500 mg 150  mL/hr over 120 Minutes Intravenous Every 24 hours 05/14/21 2042     05/14/21 2100  piperacillin-tazobactam (ZOSYN) IVPB 3.375 g        3.375 g 12.5 mL/hr over 240 Minutes Intravenous Every 8 hours 05/14/21 2043     05/14/21 1500  cefTRIAXone (ROCEPHIN) 2 g in sodium chloride 0.9 % 100 mL IVPB  Status:  Discontinued        2 g 200 mL/hr over 30 Minutes Intravenous Every 24 hours 05/14/21 1446 05/14/21 2021   05/14/21 1500  azithromycin (ZITHROMAX) 500 mg in sodium chloride 0.9 % 250 mL IVPB  Status:  Discontinued        500 mg 250 mL/hr over 60 Minutes Intravenous Every 24 hours 05/14/21 1446 05/14/21 2021      Subjective: Today, patient was seen and examined at bedside. Has had cough today, no pain. Denies fever or chills. No Nausea or vomiting. Nursing staff reported low hemoglobin.  Objective: Vitals:   05/15/21 0532 05/15/21 0833  BP: (!) 128/45   Pulse: 61   Resp: 16   Temp: 98.3 F (36.8 C)   SpO2: 100% 97%    Intake/Output Summary (Last 24 hours) at 05/15/2021 0848 Last data filed at 05/15/2021 0527 Gross per 24 hour  Intake 1500 ml  Output --  Net 1500 ml   Filed Weights   05/14/21 1512  Weight: 71 kg   Body mass index is 22.46 kg/m.   Physical Exam: GENERAL: Patient is alert awake and oriented. Not in obvious distress. HENT: Pallor+, Pupils equally reactive to light. Oral mucosa is moist NECK: is supple, no gross swelling noted. CHEST:  No crackles or wheezes.  Diminished breath sounds bilaterally. CVS: S1 and S2 heard, no murmur. Regular rate and rhythm.  ABDOMEN: Soft, non-tender, bowel sounds are present. EXTREMITIES: No edema. CNS: Cranial nerves are intact. No focal motor deficits. SKIN: warm and dry without rashes.  Data Review: I have personally reviewed the following laboratory data and studies,  CBC: Recent Labs  Lab 05/09/21 1258 05/14/21 1408  WBC 10.2 18.9*  NEUTROABS  --  11.4*  HGB 8.8* 7.9*  HCT 26.5* 23.5*  MCV 90.4 98.3  PLT 208  433   Basic Metabolic Panel: Recent Labs  Lab 05/09/21 1110 05/10/21 0448 05/14/21 1408 05/15/21 0404  NA 138 137 132* 137  K 3.4* 3.5 3.8 2.7*  CL 102 104 99 104  CO2 29 29 24 26   GLUCOSE 278* 177* 202* 141*  BUN 7* 13 13 8   CREATININE 0.46* <0.30* 0.47* 0.39*  CALCIUM 8.6* 8.3* 8.6* 8.3*  MG 1.9  --   --  1.8  PHOS  --   --   --  3.1   Liver Function Tests: Recent Labs  Lab 05/14/21 1408 05/15/21 0404  AST 23 21  ALT 54* 43  ALKPHOS 93 69  BILITOT 1.3* 0.6  PROT 6.0* 4.8*  ALBUMIN 3.2* 2.5*   No results for input(s): LIPASE, AMYLASE in the last 168 hours. No results for input(s): AMMONIA in the last 168 hours. Cardiac Enzymes: No results  for input(s): CKTOTAL, CKMB, CKMBINDEX, TROPONINI in the last 168 hours. BNP (last 3 results) Recent Labs    11/06/20 0450  BNP 389.9*    ProBNP (last 3 results) No results for input(s): PROBNP in the last 8760 hours.  CBG: Recent Labs  Lab 05/10/21 0410 05/10/21 0716 05/14/21 1338 05/14/21 2116 05/15/21 0726  GLUCAP 170* 196* 194* 178* 162*   Recent Results (from the past 240 hour(s))  Urine Culture     Status: Abnormal   Collection Time: 05/06/21  6:08 PM   Specimen: Urine, Clean Catch  Result Value Ref Range Status   Specimen Description   Final    URINE, CLEAN CATCH Performed at [facility], [place] [address], [place], [place] [address]    Special Requests   Final    NONE Performed at [facility], [place] [address], [place], [place] [address]    Culture >=100,000 COLONIES/mL KLEBSIELLA PNEUMONIAE (A)  Final   Report Status 05/09/2021 FINAL  Final   Organism ID, Bacteria KLEBSIELLA PNEUMONIAE (A)  Final      Susceptibility   Klebsiella pneumoniae - MIC*    AMPICILLIN >=32 RESISTANT Resistant     CEFAZOLIN 8 SENSITIVE Sensitive     CEFEPIME <=0.12 SENSITIVE Sensitive     CEFTRIAXONE <=0.25 SENSITIVE Sensitive     CIPROFLOXACIN <=0.25 SENSITIVE Sensitive     GENTAMICIN  <=1 SENSITIVE Sensitive     IMIPENEM <=0.25 SENSITIVE Sensitive     NITROFURANTOIN 128 RESISTANT Resistant     TRIMETH/SULFA <=20 SENSITIVE Sensitive     AMPICILLIN/SULBACTAM >=32 RESISTANT Resistant     PIP/TAZO >=128 RESISTANT Resistant     * >=100,000 COLONIES/mL KLEBSIELLA PNEUMONIAE  Culture, blood (routine x 2)     Status: None   Collection Time: 05/06/21  8:45 PM   Specimen: BLOOD  Result Value Ref Range Status   Specimen Description   Final    BLOOD LEFT Performed at [place] [address], [place], [place] [address]    Special Requests   Final    BOTTLES DRAWN AEROBIC AND ANAEROBIC Blood Culture adequate volume Performed at [place] [address] [place], [place] [address]    Culture   Final    NO GROWTH 5 DAYS Performed at [facility], [place] [address], [place], [place] [address]    Report Status 05/11/2021 FINAL  Final  Culture, blood (routine x 2)     Status: None   Collection Time: 05/06/21  9:05 PM   Specimen: BLOOD  Result Value Ref Range Status   Specimen Description   Final    BLOOD UPPER BLOOD LEFT ARM Performed at [place] [address], [place], [place] [address]    Special Requests   Final    BOTTLES DRAWN AEROBIC AND ANAEROBIC Blood Culture adequate volume Performed at [place] [address], [place], [place] [address]    Culture   Final    NO GROWTH 5 DAYS Performed at [facility], [place] [address] [place], [place] [address]    Report Status 05/11/2021 FINAL  Final  MRSA Next Gen by PCR, Nasal     Status: None   Collection Time: 05/07/21  3:11 AM   Specimen: Nasal Mucosa; Nasal Swab  Result Value Ref Range Status   MRSA by PCR Next Gen NOT DETECTED NOT DETECTED Final    Comment: (NOTE) The GeneXpert MRSA Assay (FDA approved  for NASAL specimens only), is one component of a comprehensive MRSA colonization surveillance program. It is not  intended to diagnose MRSA infection nor to guide or monitor treatment for MRSA infections. Test performance is not FDA approved in patients less than 61 years old. Performed at [facility], [place] [address] [place], [place] [address]   Resp Panel by RT-PCR (Flu A&B, Covid) Nasopharyngeal Swab     Status: None   Collection Time: 05/07/21 12:05 PM   Specimen: Nasopharyngeal Swab; Nasopharyngeal(NP) swabs in vial transport medium  Result Value Ref Range Status   SARS Coronavirus 2 by RT PCR NEGATIVE NEGATIVE Final    Comment: (NOTE) SARS-CoV-2 target nucleic acids are NOT DETECTED.  The SARS-CoV-2 RNA is generally detectable in upper respiratory specimens during the acute phase of infection. The lowest concentration of SARS-CoV-2 viral copies this assay can detect is 138 copies/mL. A negative result does not preclude SARS-Cov-2 infection and should not be used as the sole basis for treatment or other patient management decisions. A negative result may occur with  improper specimen collection/handling, submission of specimen other than nasopharyngeal swab, presence of viral mutation(s) within the areas targeted by this assay, and inadequate number of viral copies(<138 copies/mL). A negative result must be combined with clinical observations, patient history, and epidemiological information. The expected result is Negative.  Fact Sheet for Patients:  EntrepreneurPulse.com.au  Fact Sheet for Healthcare Providers:  IncredibleEmployment.be  This test is no t yet approved or cleared by the [place] FDA and  has been authorized for detection and/or diagnosis of SARS-CoV-2 by FDA under an Emergency Use Authorization (EUA). This EUA will remain  in effect (meaning this test can be used) for the duration of the COVID-19 declaration under Section 564(b)(1) of the Act, 21 U.S.C.section 360bbb-3(b)(1), unless the authorization is  terminated  or revoked sooner.       Influenza A by PCR NEGATIVE NEGATIVE Final   Influenza B by PCR NEGATIVE NEGATIVE Final    Comment: (NOTE) The Xpert Xpress SARS-CoV-2/FLU/RSV plus assay is intended as an aid in the diagnosis of influenza from Nasopharyngeal swab specimens and should not be used as a sole basis for treatment. Nasal washings and aspirates are unacceptable for Xpert Xpress SARS-CoV-2/FLU/RSV testing.  Fact Sheet for Patients: EntrepreneurPulse.com.au  Fact Sheet for Healthcare Providers: IncredibleEmployment.be  This test is not yet approved or cleared by the [place] FDA and has been authorized for detection and/or diagnosis of SARS-CoV-2 by FDA under an Emergency Use Authorization (EUA). This EUA will remain in effect (meaning this test can be used) for the duration of the COVID-19 declaration under Section 564(b)(1) of the Act, 21 U.S.C. section 360bbb-3(b)(1), unless the authorization is terminated or revoked.  Performed at [facility], [place] [address] [place], [place] [address]   CSF culture w Gram Stain     Status: None   Collection Time: 05/07/21  2:56 PM   Specimen: CSF; Cerebrospinal Fluid  Result Value Ref Range Status   Specimen Description   Final    CSF Performed at [place] [address], [place], [place] [address]    Special Requests   Final    NONE Performed at [facility], [place] [address], [place], [place] [address]    Gram Stain   Final    NO WBC SEEN NO ORGANISMS SEEN CYTOSPIN SMEAR Gram Stain Report Called to,Read Back By and Verified With: HEAVENER,A RN @1622  ON 05/07/21 BY  GOLSONM Performed at [facility], [place] [address] [place], [place] [address]    Culture   Final    NO GROWTH 3 DAYS Performed at [facility], [place] [address], [place], [place] [address]    Report Status 05/11/2021 FINAL  Final   Culture, fungus without smear     Status: None (Preliminary result)   Collection Time: 05/07/21  2:56 PM   Specimen: PATH Cytology CSF; Cerebrospinal Fluid  Result Value Ref Range Status   Specimen Description   Final    CSF Performed at [place] [address], [place], [place] [address]    Special Requests   Final    NONE Performed at [facility], [place] [address], [place], [place] [address]    Culture   Final    NO FUNGUS ISOLATED AFTER 7 DAYS Performed at [facility], [place] [address], [place], [place] [address]    Report Status PENDING  Incomplete  Resp Panel by RT-PCR (Flu A&B, Covid) Nasopharyngeal Swab     Status: None   Collection Time: 05/14/21  3:32 PM   Specimen: Nasopharyngeal Swab; Nasopharyngeal(NP) swabs in vial transport medium  Result Value Ref Range Status   SARS Coronavirus 2 by RT PCR NEGATIVE NEGATIVE Final    Comment: (NOTE) SARS-CoV-2 target nucleic acids are NOT DETECTED.  The SARS-CoV-2 RNA is generally detectable in upper respiratory specimens during the acute phase of infection. The lowest concentration of SARS-CoV-2 viral copies this assay can detect is 138 copies/mL. A negative result does not preclude SARS-Cov-2 infection and should not be used as the sole basis for treatment or other patient management decisions. A negative result may occur with  improper specimen collection/handling, submission of specimen other than nasopharyngeal swab, presence of viral mutation(s) within the areas targeted by this assay, and inadequate number of viral copies(<138 copies/mL). A negative result must be combined with clinical observations, patient history, and epidemiological information. The expected result is Negative.  Fact Sheet for Patients:  EntrepreneurPulse.com.au  Fact Sheet for Healthcare Providers:  IncredibleEmployment.be  This test is no t yet approved or  cleared by the [place] FDA and  has been authorized for detection and/or diagnosis of SARS-CoV-2 by FDA under an Emergency Use Authorization (EUA). This EUA will remain  in effect (meaning this test can be used) for the duration of the COVID-19 declaration under Section 564(b)(1) of the Act, 21 U.S.C.section 360bbb-3(b)(1), unless the authorization is terminated  or revoked sooner.       Influenza A by PCR NEGATIVE NEGATIVE Final   Influenza B by PCR NEGATIVE NEGATIVE Final    Comment: (NOTE) The Xpert Xpress SARS-CoV-2/FLU/RSV plus assay is intended as an aid in the diagnosis of influenza from Nasopharyngeal swab specimens and should not be used as a sole basis for treatment. Nasal washings and aspirates are unacceptable for Xpert Xpress SARS-CoV-2/FLU/RSV testing.  Fact Sheet for Patients: EntrepreneurPulse.com.au  Fact Sheet for Healthcare Providers: IncredibleEmployment.be  This test is not yet approved or cleared by the [place] FDA and has been authorized for detection and/or diagnosis of SARS-CoV-2 by FDA under an Emergency Use Authorization (EUA). This EUA will remain in effect (meaning this test can be used) for the duration of the COVID-19 declaration under Section 564(b)(1) of the Act, 21 U.S.C. section 360bbb-3(b)(1), unless the authorization is terminated or revoked.  Performed at [facility], [place] [address] [place],  [address]   MRSA  Next Gen by PCR, Nasal     Status: None   Collection Time: 05/15/21  2:00 AM   Specimen: Nasal Swab  Result Value Ref Range Status   MRSA by PCR Next Gen NOT DETECTED NOT DETECTED Final    Comment: (NOTE) The GeneXpert MRSA Assay (FDA approved for NASAL specimens only), is one component of a comprehensive MRSA colonization surveillance program. It is not intended to diagnose MRSA infection nor to guide or monitor treatment for MRSA infections. Test  performance is not FDA approved in patients less than 8 years old. Performed at [facility], [place] [address], [place], [place] [address]      Studies: DG Chest 2 View  Result Date: 05/14/2021 CLINICAL DATA:  Sepsis EXAM: CHEST - 2 VIEW COMPARISON:  May 06, 2021 FINDINGS: The heart size and mediastinal contours are stable. Consolidation of the left mid and left lung base are identified. Right lung is clear. There is no pulmonary edema. The visualized skeletal structures are unremarkable. IMPRESSION: Left lung pneumonia. Electronically Signed   By: Abelardo Diesel M.D.   On: 05/14/2021 14:24      Flora Lipps, MD  [facility] 05/15/2021  If 7PM-7AM, please contact night-coverage

## 2021-05-16 DIAGNOSIS — E119 Type 2 diabetes mellitus without complications: Secondary | ICD-10-CM | POA: Diagnosis not present

## 2021-05-16 DIAGNOSIS — I10 Essential (primary) hypertension: Secondary | ICD-10-CM | POA: Diagnosis not present

## 2021-05-16 DIAGNOSIS — J189 Pneumonia, unspecified organism: Secondary | ICD-10-CM | POA: Diagnosis not present

## 2021-05-16 DIAGNOSIS — I5032 Chronic diastolic (congestive) heart failure: Secondary | ICD-10-CM | POA: Diagnosis not present

## 2021-05-16 LAB — GLUCOSE, CAPILLARY
Glucose-Capillary: 115 mg/dL — ABNORMAL HIGH (ref 70–99)
Glucose-Capillary: 196 mg/dL — ABNORMAL HIGH (ref 70–99)

## 2021-05-16 LAB — BASIC METABOLIC PANEL
Anion gap: 4 — ABNORMAL LOW (ref 5–15)
BUN: 10 mg/dL (ref 8–23)
CO2: 27 mmol/L (ref 22–32)
Calcium: 8.5 mg/dL — ABNORMAL LOW (ref 8.9–10.3)
Chloride: 110 mmol/L (ref 98–111)
Creatinine, Ser: 0.3 mg/dL — ABNORMAL LOW (ref 0.61–1.24)
GFR, Estimated: >60 mL/min (ref >60)
Glucose, Bld: 159 mg/dL — ABNORMAL HIGH (ref 70–99)
Potassium: 3.3 mmol/L — ABNORMAL LOW (ref 3.5–5.1)
Sodium: 141 mmol/L (ref 135–145)

## 2021-05-16 LAB — TYPE AND SCREEN
ABO/RH(D): O POS
Antibody Screen: NEGATIVE
Unit division: 0

## 2021-05-16 LAB — CBC
HCT: 20.3 % — ABNORMAL LOW (ref 39.0–52.0)
Hemoglobin: 7 g/dL — ABNORMAL LOW (ref 13.0–17.0)
MCH: 29.4 pg (ref 26.0–34.0)
MCHC: 34.5 g/dL (ref 30.0–36.0)
MCV: 85.3 fL (ref 80.0–100.0)
Platelets: 223 10*3/uL (ref 150–400)
RBC: 2.38 MIL/uL — ABNORMAL LOW (ref 4.22–5.81)
RDW: 15.7 % — ABNORMAL HIGH (ref 11.5–15.5)
WBC: 7.9 10*3/uL (ref 4.0–10.5)
nRBC: 0 % (ref 0.0–0.2)

## 2021-05-16 LAB — MAGNESIUM: Magnesium: 2.2 mg/dL (ref 1.7–2.4)

## 2021-05-16 LAB — BPAM RBC
Blood Product Expiration Date: 202211072359
ISSUE DATE / TIME: 202210091535
Unit Type and Rh: 5100

## 2021-05-16 LAB — HEMOGLOBIN AND HEMATOCRIT, BLOOD
HCT: 20.9 % — ABNORMAL LOW (ref 39.0–52.0)
Hemoglobin: 7.2 g/dL — ABNORMAL LOW (ref 13.0–17.0)

## 2021-05-16 MED ORDER — SENNOSIDES-DOCUSATE SODIUM 8.6-50 MG PO TABS: 1.0000 | ORAL_TABLET | Freq: Every evening | ORAL | 0 refills | Status: AC | PRN

## 2021-05-16 MED ORDER — POTASSIUM CHLORIDE CRYS ER 20 MEQ PO TBCR
40.0000 meq | EXTENDED_RELEASE_TABLET | Freq: Once | ORAL | Status: AC
  Administered 2021-05-16: 40 meq via ORAL
  Filled 2021-05-16: qty 2

## 2021-05-16 MED ORDER — DOXYCYCLINE HYCLATE 100 MG PO CAPS: 100.0000 mg | ORAL_CAPSULE | Freq: Every day | ORAL | Status: AC

## 2021-05-16 MED ORDER — AMOXICILLIN-POT CLAVULANATE 875-125 MG PO TABS: 1.0000 | ORAL_TABLET | Freq: Two times a day (BID) | ORAL | 0 refills | Status: AC

## 2021-05-16 NOTE — Progress Notes (Signed)
AVS reviewed with pt. IV removed. Pt given disposable scrubs to go home with. Phone and glasses in pt care at discharge. All questions answered. Home hospice planned to meet pt this evening at pt home. Tele d/c. Oralia Rud, RN

## 2021-05-16 NOTE — Evaluation (Signed)
Physical Therapy One Time Evaluation Patient Details Name: Tommy Peterson MRN: [id number] DOB: 03/09/1941 Today's Date: 05/16/2021  History of Present Illness  80 y.o. male with recent admission for metabolic encephalopathy and UTI who was on [place] as outpatient, recently discharged and presented to the hospital with confusion and elevated temperature. Patient was then considered for admission to hospital on 05/14/21 for possible healthcare associated pneumonia with metabolic encephalopathy.  PMHx: T-cell large granular lymphocytic leukemia, chronic anemia blood transfusion dependent, MGUS, history of C. difficile, collagenous colitis/chronic diarrhea, PAF-previously on Eliquis, chronic diastolic CHF, HTN, G2IR  Clinical Impression  Patient evaluated by Physical Therapy with no further acute PT needs identified. All education has been completed and the patient has no further questions.  Pt alert and orientated and mobilizing well.  Pt plans to return home upon d/c. See below for any follow-up Physical Therapy or equipment needs. PT is signing off. Thank you for this referral.       Recommendations for follow up therapy are one component of a multi-disciplinary discharge planning process, led by the attending physician.  Recommendations may be updated based on patient status, additional functional criteria and insurance authorization.  Follow Up Recommendations No PT follow up    Equipment Recommendations  None recommended by PT    Recommendations for Other Services       Precautions / Restrictions Precautions Precautions: Fall      Mobility  Bed Mobility Overal bed mobility: Modified Independent             General bed mobility comments: pt sat EOB and donned socks independently    Transfers Overall transfer level: Modified independent                  Ambulation/Gait Ambulation/Gait assistance: Supervision;Modified independent (Device/Increase time) Gait  Distance (Feet): 800 Feet Assistive device: Rolling walker (2 wheeled) Gait Pattern/deviations: Step-through pattern;Decreased stride length     General Gait Details: pt preferred to use RW (has RW at home); no unsteadiness or LOB observed, pt denies symptoms  Stairs            Wheelchair Mobility    Modified Rankin (Stroke Patients Only)       Balance Overall balance assessment: No apparent balance deficits (not formally assessed)                                           Pertinent Vitals/Pain Pain Assessment: No/denies pain    Home Living Family/patient expects to be discharged to:: Private residence Living Arrangements: Alone Available Help at Discharge: Family;Available 24 hours/day Type of Home: House Home Access: Stairs to enter Entrance Stairs-Rails: Right Entrance Stairs-Number of Steps: 3 and 6 (his typical way) Home Layout: Able to live on main level with bedroom/bathroom;Laundry or work area in [place]: Environmental consultant - 2 wheels;Grab bars - toilet;Grab bars - tub/shower      Prior Function Level of Independence: Independent         Comments: pt reports amb without device recently     Hand Dominance        Extremity/Trunk Assessment        Lower Extremity Assessment Lower Extremity Assessment: Overall WFL for tasks assessed    Cervical / Trunk Assessment Cervical / Trunk Assessment: Normal  Communication   Communication: HOH  Cognition Arousal/Alertness: Awake/alert Behavior During Therapy: WFL for tasks assessed/performed Overall  Cognitive Status: Within Functional Limits for tasks assessed                                        General Comments      Exercises     Assessment/Plan    PT Assessment Patent does not need any further PT services  PT Problem List         PT Treatment Interventions      PT Goals (Current goals can be found in the Care Plan section)  Acute Rehab PT  Goals PT Goal Formulation: All assessment and education complete, DC therapy    Frequency     Barriers to discharge        Co-evaluation               AM-PAC PT "6 Clicks" Mobility  Outcome Measure Help needed turning from your back to your side while in a flat bed without using bedrails?: None Help needed moving from lying on your back to sitting on the side of a flat bed without using bedrails?: None Help needed moving to and from a bed to a chair (including a wheelchair)?: None Help needed standing up from a chair using your arms (e.g., wheelchair or bedside chair)?: None Help needed to walk in hospital room?: A Little Help needed climbing 3-5 steps with a railing? : A Little 6 Click Score: 22    End of Session Equipment Utilized During Treatment: Gait belt Activity Tolerance: Patient tolerated treatment well Patient left: in chair;with call bell/phone within reach Nurse Communication: Mobility status PT Visit Diagnosis: Difficulty in walking, not elsewhere classified (R26.2)    Time: 9622-2979 PT Time Calculation (min) (ACUTE ONLY): 25 min   Charges:   PT Evaluation $PT Eval Low Complexity: 1 Low     Kati PT, DPT Acute Rehabilitation Services Pager: [phone] Office: [place] 05/16/2021, 1:12 PM

## 2021-05-16 NOTE — Progress Notes (Signed)
Hgb came back 7.2, patient remains asymptomatic. Discussed with provider on call. Will hold off on blood transfusion, patient notified.

## 2021-05-16 NOTE — Discharge Summary (Signed)
Physician Discharge Summary  Tommy Peterson NTI:[id number] DOB: [birth year] DOA: 05/14/2021  PCP: [name], DO  Admit date: 05/14/2021 Discharge date: 05/16/2021  Admitted From: Home  Discharge disposition: Home with hospice  Recommendations for Outpatient Follow-Up:   Follow up with your primary care provider in one week.  Check CBC, BMP, magnesium in the next visit Follow-up with the hospice care provider if arranged after hospice meeting at home.  Discharge Diagnosis:   Principal Problem:   Pneumonia Active Problems:   Essential hypertension   Leukemia not having achieved remission (Tommy Peterson)   DM type 2 (diabetes mellitus, type 2) (Tommy Peterson)   (HFpEF) heart failure with preserved ejection fraction [facility])   Discharge Condition: Improved.  Diet recommendation: Low sodium, heart healthy.  Carbohydrate-modified.    Wound care: None.  Code status: Full.   History of Present Illness:   Tommy Peterson is a 80 y.o. male with past medical history of leukemia, atrial fibrillation not on anticoagulation, history of GI bleed in the past, chronic anemia with frequent transfusion and recent admission for metabolic encephalopathy and UTI who was on [place] as outpatient, recently discharged 4 days back presented to the hospital with confusion and elevated temperature. In the  ED, he did have a temperature of 102.7 F and was hypertensive.  He required 2 L of oxygen in the ED.   Lactate was 1.8.  Sodium was slightly low at 132.  Creatinine of 0.4.  LFTs mildly elevated ALT.  Patient had mild leukocytosis with WBC at 18.9.  Hemoglobin 7.9.  Urine analysis showed no nitrates no leukocytes.  Chest x-ray showed left-sided infiltrate.  Patient was then considered for admission to hospital for possible healthcare associated pneumonia with metabolic encephalopathy.   Hospital Course:   Following conditions were addressed during hospitalization as listed below,  Left-sided pneumonia.   Patient was initially on  vancomycin and Zosyn due to recent hospitalization and concern for healthcare associated pneumonia.  Symptomatically feels better at this time.  We will continue Augmentin on discharge to complete course.   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  Patient gets transfusions every 2 weeks or so as outpatient.  Has immunocompromised status.   Patient was previously on Methotrexate but was recently changed to Cytoxan by oncology.  Not in remission.  Poor prognosis due to repeated infections.  Patient received 1 unit of packed RBC during hospitalization for his chronic recurrent anemia.   Paroxysmal atrial fibrillation.  On amiodarone and beta-blockers at home.  Off anticoagulation due to history of GI bleed.     Diabetes mellitus type II.  Latest hemoglobin A1c of 9.8.  On insulin and pioglitazone at home.  Resume on discharge.  Acute metabolic encephalopathy Improved after treatment of pneumonia.   Hypokalemia Improved after replacement.   Recently treated Klebsiella UTI.  Urinalysis within normal limits this time.  Denies deconditioning, debility, overall declining health.   Frail patient with poor oral intake and failure to thrive.  At this time patient has been considered for hospice care at home.  Had a prolonged discussion with the patient's significant other and daughters about the long-term goals of care.  Will benefit from palliative care hospice at home due to his history of leukemia not in remission with recurrent infections frailty and overall declining general condition of the patient   Disposition.  At this time, patient is stable for disposition home with outpatient PCP/hospice follow-up.  Hospice team is going to follow-up with the patient at  home after discharge.  Spoke with the patient's daughter prior to disposition home.  Medical Consultants:   None.  Procedures:    Infusion of packed RBC. Subjective:   Today, patient  seen and examined at bedside.  Wishes to go home.  Denies any headache, dizziness, shortness of breath, chest pain.  Discharge Exam:   Vitals:   05/16/21 0912 05/16/21 1300  BP: (!) 158/57 (!) 152/75  Pulse: 60 60  Resp:    Temp:  98.1 F (36.7 C)  SpO2:  100%   Vitals:   05/15/21 2300 05/16/21 0527 05/16/21 0912 05/16/21 1300  BP: (!) 147/63 (!) 126/48 (!) 158/57 (!) 152/75  Pulse: 61 (!) 51 60 60  Resp: 16 15    Temp: 98.2 F (36.8 C) (!) 97.5 F (36.4 C)  98.1 F (36.7 C)  TempSrc: Oral Oral  Oral  SpO2:  98%  100%  Weight:      Height:       General: Alert awake, not in obvious distress, frail-appearing male HENT: pupils equally reacting to light, pallor noted. Chest:  Clear breath sounds.  Diminished breath sounds bilaterally. No crackles or wheezes.  CVS: S1 &S2 heard. No murmur.  Regular rate and rhythm. Abdomen: Soft, nontender, nondistended.  Bowel sounds are heard.   Extremities: No cyanosis, clubbing or edema.  Peripheral pulses are palpable. Psych: Alert, awake and oriented, normal mood CNS:  No cranial nerve deficits.  Power equal in all extremities.   Skin: Warm and dry.  No rashes noted.  The results of significant diagnostics from this hospitalization (including imaging, microbiology, ancillary and laboratory) are listed below for reference.     Diagnostic Studies:   DG Chest 2 View  Result Date: 05/14/2021 CLINICAL DATA:  Sepsis EXAM: CHEST - 2 VIEW COMPARISON:  May 06, 2021 FINDINGS: The heart size and mediastinal contours are stable. Consolidation of the left mid and left lung base are identified. Right lung is clear. There is no pulmonary edema. The visualized skeletal structures are unremarkable. IMPRESSION: Left lung pneumonia. Electronically Signed   By: Abelardo Diesel M.D.   On: 05/14/2021 14:24     Labs:   Basic Metabolic Panel: Recent Labs  Lab 05/10/21 0448 05/14/21 1408 05/15/21 0404 05/16/21 0413  NA 137 132* 137 141  K 3.5  3.8 2.7* 3.3*  CL 104 99 104 110  CO2 29 24 26 27   GLUCOSE 177* 202* 141* 159*  BUN 13 13 8 10   CREATININE <0.30* 0.47* 0.39* 0.30*  CALCIUM 8.3* 8.6* 8.3* 8.5*  MG  --   --  1.8 2.2  PHOS  --   --  3.1  --    GFR Estimated Creatinine Clearance: 72.7 mL/min (A) (by C-G formula based on SCr of 0.3 mg/dL (L)). Liver Function Tests: Recent Labs  Lab 05/14/21 1408 05/15/21 0404  AST 23 21  ALT 54* 43  ALKPHOS 93 69  BILITOT 1.3* 0.6  PROT 6.0* 4.8*  ALBUMIN 3.2* 2.5*   No results for input(s): LIPASE, AMYLASE in the last 168 hours. No results for input(s): AMMONIA in the last 168 hours. Coagulation profile Recent Labs  Lab 05/14/21 1519  INR 1.2    CBC: Recent Labs  Lab 05/14/21 1408 05/15/21 0820 05/16/21 0027 05/16/21 0413  WBC 18.9* 10.6*  --  7.9  NEUTROABS 11.4*  --   --   --   HGB 7.9* 6.3* 7.2* 7.0*  HCT 23.5* 18.7* 20.9* 20.3*  MCV 98.3 89.0  --  85.3  PLT 322 262  --  223   Cardiac Enzymes: No results for input(s): CKTOTAL, CKMB, CKMBINDEX, TROPONINI in the last 168 hours. BNP: Invalid input(s): POCBNP CBG: Recent Labs  Lab 05/15/21 1123 05/15/21 1625 05/15/21 2041 05/16/21 0730 05/16/21 1104  GLUCAP 274* 292* 325* 115* 196*   D-Dimer No results for input(s): DDIMER in the last 72 hours. Hgb A1c No results for input(s): HGBA1C in the last 72 hours. Lipid Profile No results for input(s): CHOL, HDL, LDLCALC, TRIG, CHOLHDL, LDLDIRECT in the last 72 hours. Thyroid function studies No results for input(s): TSH, T4TOTAL, T3FREE, THYROIDAB in the last 72 hours.  Invalid input(s): FREET3 Anemia work up No results for input(s): VITAMINB12, FOLATE, FERRITIN, TIBC, IRON, RETICCTPCT in the last 72 hours. Microbiology Recent Results (from the past 240 hour(s))  Urine Culture     Status: Abnormal   Collection Time: 05/06/21  6:08 PM   Specimen: Urine, Clean Catch  Result Value Ref Range Status   Specimen Description   Final    URINE, CLEAN  CATCH Performed at [facility], [place] [address], [place], [place] [address]    Special Requests   Final    NONE Performed at [facility], [place] [address] [place], [place] [address]    Culture >=100,000 COLONIES/mL KLEBSIELLA PNEUMONIAE (A)  Final   Report Status 05/09/2021 FINAL  Final   Organism ID, Bacteria KLEBSIELLA PNEUMONIAE (A)  Final      Susceptibility   Klebsiella pneumoniae - MIC*    AMPICILLIN >=32 RESISTANT Resistant     CEFAZOLIN 8 SENSITIVE Sensitive     CEFEPIME <=0.12 SENSITIVE Sensitive     CEFTRIAXONE <=0.25 SENSITIVE Sensitive     CIPROFLOXACIN <=0.25 SENSITIVE Sensitive     GENTAMICIN <=1 SENSITIVE Sensitive     IMIPENEM <=0.25 SENSITIVE Sensitive     NITROFURANTOIN 128 RESISTANT Resistant     TRIMETH/SULFA <=20 SENSITIVE Sensitive     AMPICILLIN/SULBACTAM >=32 RESISTANT Resistant     PIP/TAZO >=128 RESISTANT Resistant     * >=100,000 COLONIES/mL KLEBSIELLA PNEUMONIAE  Culture, blood (routine x 2)     Status: None   Collection Time: 05/06/21  8:45 PM   Specimen: BLOOD  Result Value Ref Range Status   Specimen Description   Final    BLOOD LEFT Performed at [place] [address] [place], [place] [address]    Special Requests   Final    BOTTLES DRAWN AEROBIC AND ANAEROBIC Blood Culture adequate volume Performed at [place] [address] [place], [place] [address]    Culture   Final    NO GROWTH 5 DAYS Performed at [facility], [place] [address], [place], [place] [address]    Report Status 05/11/2021 FINAL  Final  Culture, blood (routine x 2)     Status: None   Collection Time: 05/06/21  9:05 PM   Specimen: BLOOD  Result Value Ref Range Status   Specimen Description   Final    BLOOD UPPER BLOOD LEFT ARM Performed at [place] [address], [place], [place] [address]    Special Requests   Final    BOTTLES DRAWN AEROBIC AND ANAEROBIC  Blood Culture adequate volume Performed at [place] [address] [place], [place] [address]    Culture   Final    NO GROWTH 5 DAYS Performed at [facility], [place] [address], [place], [place]  [address]    Report Status 05/11/2021 FINAL  Final  MRSA Next Gen by PCR, Nasal     Status: None   Collection Time: 05/07/21  3:11 AM   Specimen: Nasal Mucosa; Nasal Swab  Result Value Ref Range Status   MRSA by PCR Next Gen NOT DETECTED NOT DETECTED Final    Comment: (NOTE) The GeneXpert MRSA Assay (FDA approved for NASAL specimens only), is one component of a comprehensive MRSA colonization surveillance program. It is not intended to diagnose MRSA infection nor to guide or monitor treatment for MRSA infections. Test performance is not FDA approved in patients less than 74 years old. Performed at [facility], [place] [address] [place], [place] [address]   Resp Panel by RT-PCR (Flu A&B, Covid) Nasopharyngeal Swab     Status: None   Collection Time: 05/07/21 12:05 PM   Specimen: Nasopharyngeal Swab; Nasopharyngeal(NP) swabs in vial transport medium  Result Value Ref Range Status   SARS Coronavirus 2 by RT PCR NEGATIVE NEGATIVE Final    Comment: (NOTE) SARS-CoV-2 target nucleic acids are NOT DETECTED.  The SARS-CoV-2 RNA is generally detectable in upper respiratory specimens during the acute phase of infection. The lowest concentration of SARS-CoV-2 viral copies this assay can detect is 138 copies/mL. A negative result does not preclude SARS-Cov-2 infection and should not be used as the sole basis for treatment or other patient management decisions. A negative result may occur with  improper specimen collection/handling, submission of specimen other than nasopharyngeal swab, presence of viral mutation(s) within the areas targeted by this assay, and inadequate number of viral copies(<138 copies/mL). A negative result must be combined  with clinical observations, patient history, and epidemiological information. The expected result is Negative.  Fact Sheet for Patients:  EntrepreneurPulse.com.au  Fact Sheet for Healthcare Providers:  IncredibleEmployment.be  This test is no t yet approved or cleared by the [place] FDA and  has been authorized for detection and/or diagnosis of SARS-CoV-2 by FDA under an Emergency Use Authorization (EUA). This EUA will remain  in effect (meaning this test can be used) for the duration of the COVID-19 declaration under Section 564(b)(1) of the Act, 21 U.S.C.section 360bbb-3(b)(1), unless the authorization is terminated  or revoked sooner.       Influenza A by PCR NEGATIVE NEGATIVE Final   Influenza B by PCR NEGATIVE NEGATIVE Final    Comment: (NOTE) The Xpert Xpress SARS-CoV-2/FLU/RSV plus assay is intended as an aid in the diagnosis of influenza from Nasopharyngeal swab specimens and should not be used as a sole basis for treatment. Nasal washings and aspirates are unacceptable for Xpert Xpress SARS-CoV-2/FLU/RSV testing.  Fact Sheet for Patients: EntrepreneurPulse.com.au  Fact Sheet for Healthcare Providers: IncredibleEmployment.be  This test is not yet approved or cleared by the [place] FDA and has been authorized for detection and/or diagnosis of SARS-CoV-2 by FDA under an Emergency Use Authorization (EUA). This EUA will remain in effect (meaning this test can be used) for the duration of the COVID-19 declaration under Section 564(b)(1) of the Act, 21 U.S.C. section 360bbb-3(b)(1), unless the authorization is terminated or revoked.  Performed at [facility], [place] [address], [place], [place] [address]   CSF culture w Gram Stain     Status: None   Collection Time: 05/07/21  2:56 PM   Specimen: CSF; Cerebrospinal Fluid  Result Value Ref Range Status   Specimen  Description   Final    CSF Performed at [facility],  [place] [address], [place], [place] [address]    Special Requests   Final    NONE Performed at [facility], [place] [address], [place], [place] [address]    Gram Stain   Final    NO WBC SEEN NO ORGANISMS SEEN CYTOSPIN SMEAR Gram Stain Report Called to,Read Back By and Verified With: HEAVENER,A RN @1622  ON 05/07/21 BY GOLSONM Performed at [facility], [place] [address], [place], [place] [address]    Culture   Final    NO GROWTH 3 DAYS Performed at [facility], [place] [address], [place], [place] [address]    Report Status 05/11/2021 FINAL  Final  Culture, fungus without smear     Status: None (Preliminary result)   Collection Time: 05/07/21  2:56 PM   Specimen: PATH Cytology CSF; Cerebrospinal Fluid  Result Value Ref Range Status   Specimen Description   Final    CSF Performed at [place] [address], [place], [place] [address]    Special Requests   Final    NONE Performed at [facility], [place] [address], [place], [place] [address]    Culture   Final    NO FUNGUS ISOLATED AFTER 9 DAYS Performed at [facility], [place] [address] [place], [place] [address]    Report Status PENDING  Incomplete  Culture, blood (Routine x 2)     Status: None (Preliminary result)   Collection Time: 05/14/21  3:19 PM   Specimen: BLOOD  Result Value Ref Range Status   Specimen Description   Final    BLOOD BLOOD LEFT HAND Performed at [place] [address] [place], [place] [address]    Special Requests   Final    BOTTLES DRAWN AEROBIC ONLY Blood Culture adequate volume Performed at [place] [address] [place], [place] [address]    Culture   Final    NO GROWTH 2 DAYS Performed at [place] [address] [place], [place] [address]    Report Status PENDING  Incomplete   Resp Panel by RT-PCR (Flu A&B, Covid) Nasopharyngeal Swab     Status: None   Collection Time: 05/14/21  3:32 PM   Specimen: Nasopharyngeal Swab; Nasopharyngeal(NP) swabs in vial transport medium  Result Value Ref Range Status   SARS Coronavirus 2 by RT PCR NEGATIVE NEGATIVE Final    Comment: (NOTE) SARS-CoV-2 target nucleic acids are NOT DETECTED.  The SARS-CoV-2 RNA is generally detectable in upper respiratory specimens during the acute phase of infection. The lowest concentration of SARS-CoV-2 viral copies this assay can detect is 138 copies/mL. A negative result does not preclude SARS-Cov-2 infection and should not be used as the sole basis for treatment or other patient management decisions. A negative result may occur with  improper specimen collection/handling, submission of specimen other than nasopharyngeal swab, presence of viral mutation(s) within the areas targeted by this assay, and inadequate number of viral copies(<138 copies/mL). A negative result must be combined with clinical observations, patient history, and epidemiological information. The expected result is Negative.  Fact Sheet for Patients:  EntrepreneurPulse.com.au  Fact Sheet for Healthcare Providers:  IncredibleEmployment.be  This test is no t yet approved or cleared by the [place] FDA and  has been authorized for detection and/or diagnosis of SARS-CoV-2 by FDA under an Emergency Use Authorization (EUA). This EUA will remain  in effect (meaning this test can be used)  for the duration of the COVID-19 declaration under Section 564(b)(1) of the Act, 21 U.S.C.section 360bbb-3(b)(1), unless the authorization is terminated  or revoked sooner.       Influenza A by PCR NEGATIVE NEGATIVE Final   Influenza B by PCR NEGATIVE NEGATIVE Final    Comment: (NOTE) The Xpert Xpress SARS-CoV-2/FLU/RSV plus assay is intended as an aid in the diagnosis of influenza from  Nasopharyngeal swab specimens and should not be used as a sole basis for treatment. Nasal washings and aspirates are unacceptable for Xpert Xpress SARS-CoV-2/FLU/RSV testing.  Fact Sheet for Patients: EntrepreneurPulse.com.au  Fact Sheet for Healthcare Providers: IncredibleEmployment.be  This test is not yet approved or cleared by the [place] FDA and has been authorized for detection and/or diagnosis of SARS-CoV-2 by FDA under an Emergency Use Authorization (EUA). This EUA will remain in effect (meaning this test can be used) for the duration of the COVID-19 declaration under Section 564(b)(1) of the Act, 21 U.S.C. section 360bbb-3(b)(1), unless the authorization is terminated or revoked.  Performed at [facility], [place] [address] [place], [place] [address]   MRSA Next Gen by PCR, Nasal     Status: None   Collection Time: 05/15/21  2:00 AM   Specimen: Nasal Swab  Result Value Ref Range Status   MRSA by PCR Next Gen NOT DETECTED NOT DETECTED Final    Comment: (NOTE) The GeneXpert MRSA Assay (FDA approved for NASAL specimens only), is one component of a comprehensive MRSA colonization surveillance program. It is not intended to diagnose MRSA infection nor to guide or monitor treatment for MRSA infections. Test performance is not FDA approved in patients less than 39 years old. Performed at [facility] [place], [place] [address], [place], [place] [address]      Discharge Instructions:   Discharge Instructions     Diet - low sodium heart healthy   Complete by: As directed    Discharge instructions   Complete by: As directed    Follow up with your primary care provider in one week. Complete the course of antibiotics. Follow up with hospice care provider if arranged   Increase activity slowly   Complete by: As directed    No wound care   Complete by: As directed       Allergies as of 05/16/2021        Reactions   Bee Pollen Anaphylaxis, Swelling   Bee Venom Swelling, Anaphylaxis   Metformin And Related Nausea And Vomiting, Other (See Comments)   Made the patient feel sick   Sulfamethoxazole-trimethoprim Nausea And Vomiting   Metformin Hcl Nausea And Vomiting, Other (See Comments)   Made the patient feel sick        Medication List     STOP taking these medications    cefdinir 300 MG capsule Commonly known as: OMNICEF       TAKE these medications    acetaminophen 325 MG tablet Commonly known as: TYLENOL Take 650 mg by mouth 2 (two) times daily as needed for fever.   amiodarone 200 MG tablet Commonly known as: PACERONE Take 1 tablet (200 mg total) by mouth daily. What changed: when to take this Notes to patient: Given 05/16/2021   amoxicillin-clavulanate 875-125 MG tablet Commonly known as: Augmentin Take 1 tablet by mouth 2 (two) times daily for 5 days. Notes to patient: Start 10/11   aspirin 325 MG EC tablet Take 325 mg by mouth every Monday, Wednesday, and Friday. Notes to patient: Take  10/12 and 10/14   B COMPLEX 100 PO Take 1 tablet by mouth 2 (two) times daily.   BD Pen Needle Nano 2nd Gen 32G X 4 MM Misc Generic drug: Insulin Pen Needle   budesonide 3 MG 24 hr capsule Commonly known as: ENTOCORT EC Take 9 mg by mouth in the morning. Notes to patient: Given 10/10   CINNAMON PLUS CHROMIUM PO Take 2 tablets by mouth in the morning and at bedtime. Notes to patient: Start 10/11   Cranberry 400 MG Tabs Take 400 mg by mouth 2 (two) times daily. Notes to patient: Start 10/11   cyclophosphamide 50 MG capsule Commonly known as: CYTOXAN Take 1 capsule (50 mg total) by mouth daily. Take with food to minimize GI upset. Take early in the day and maintain hydration. Notes to patient: Start 10/11   D-Mannose 500 MG Caps Take 500 mg by mouth in the morning and at bedtime.   doxycycline 100 MG capsule Commonly known as: VIBRAMYCIN Take 1 capsule  (100 mg total) by mouth at bedtime. After completion of augmentin antibiotic Start taking on: May 22, 2021 What changed:  additional instructions These instructions start on May 22, 2021. If you are unsure what to do until then, ask your doctor or other care provider.   folic acid 1 MG tablet Commonly known as: FOLVITE TAKE 1 TABLET BY MOUTH EVERY DAY What changed: when to take this Notes to patient: Given 10/10   FreeStyle Libre 2 Sensor Misc Inject 1 patch into the skin every 14 (fourteen) days.   metoprolol tartrate 50 MG tablet Commonly known as: LOPRESSOR Take 50 mg by mouth in the morning and at bedtime.   NovoLOG FlexPen 100 UNIT/ML FlexPen Generic drug: insulin aspart Inject 1-15 Units into the skin 3 (three) times daily with meals. Glucose 121 - 150: 2 units, Glucose 151 - 200: 3 units, Glucose 201 - 250: 5 units, Glucose 251 - 300: 8 units, Glucose 301 - 350: 11 units, Glucose 351 - 400: 15 units, Glucose > 400 call MD What changed: additional instructions   omeprazole 40 MG capsule Commonly known as: PRILOSEC Take 40 mg by mouth 2 (two) times daily.   oxybutynin 5 MG tablet Commonly known as: DITROPAN Take 5 mg by mouth in the morning. Notes to patient: Given 10/10   pioglitazone 15 MG tablet Commonly known as: ACTOS Take 15 mg by mouth every morning. Notes to patient: Start 10/11   Prostate Health Caps Take 1 capsule by mouth daily with breakfast. Notes to patient: Start 10/11   senna-docusate 8.6-50 MG tablet Commonly known as: Senokot-S Take 1 tablet by mouth at bedtime as needed for mild constipation.   Soliqua 100-33 UNT-MCG/ML Sopn Generic drug: Insulin Glargine-Lixisenatide Inject 23 Units into the skin at bedtime.   Vitamin B-12 5000 MCG Subl Place 5,000 mcg under the tongue in the morning. Notes to patient: Given 10/10       Time coordinating discharge: 39 minutes  Signed:  Lorrane Mccay  [facility] 05/16/2021, 3:19  PM

## 2021-05-16 NOTE — Progress Notes (Signed)
Pt refuses to wear DNR bracelet and states he wants it rescinded. RN removed DNR bracelet and educated pt. MD made aware. Oralia Rud, RN

## 2021-05-16 NOTE — Progress Notes (Signed)
CN updated, reached out to MD, DNR rescinded, per patient request. Order noted. SRP, RN

## 2021-05-16 NOTE — Progress Notes (Signed)
Inpatient Diabetes Program Recommendations  AACE/ADA: New Consensus Statement on Inpatient Glycemic Control (2015)  Target Ranges:  Prepandial:   less than 140 mg/dL      Peak postprandial:   less than 180 mg/dL (1-2 hours)      Critically ill patients:  140 - 180 mg/dL   Lab Results  Component Value Date   GLUCAP 115 (H) 05/16/2021   HGBA1C 9.8 (H) 04/04/2021    Review of Glycemic Control Results for Tommy Peterson, Tommy Peterson (MRN [id number]) as of 05/16/2021 10:04  Ref. Range 05/15/2021 07:26 05/15/2021 11:23 05/15/2021 16:25 05/15/2021 20:41 05/16/2021 07:30  Glucose-Capillary Latest Ref Range: 70 - 99 mg/dL 162 (H) 274 (H) 292 (H) 325 (H) 115 (H)   Diabetes history: DM 2 Outpatient Diabetes medications: Novolog 0-15 units tid, Soliqua 23 units, Actos 15 mg Daily Current orders for Inpatient glycemic control:  Semglee 20 units qhs Novolog 0-9 units tid + hs  Inpatient Diabetes Program Recommendations:   Glucose trends increase after meals intake  - Consider Novolog 4 units tid meal coverage if eating >50% of meals  Thanks,  Tama Headings RN, MSN, BC-ADM Inpatient Diabetes Coordinator Team Pager [phone] (8a-5p)

## 2021-05-16 NOTE — TOC Transition Note (Signed)
Transition of Care [facility]) - CM/SW Discharge Note   Patient Details  Name: Tommy Peterson MRN: [id number] Date of Birth: Jan 14, 1941  [facility] CM/SW Contact:  Ross Ludwig, LCSW Phone Number: 05/16/2021, 1:51 PM   Clinical Narrative:     Patient will be going home with home hospice through [facility] hospice services.  CSW signing off please reconsult with any other social work needs, home hospice agency has been notified of planned discharge.   Final next level of care: Home w Hospice Care Barriers to Discharge: Barriers Resolved   Patient Goals and CMS Choice Patient states their goals for this hospitalization and ongoing recovery are:: To return back home with hospice services. CMS Medicare.gov Compare Post Acute Care list provided to:: Patient Represenative (must comment) Choice offered to / list presented to : Adult Children  Discharge Placement                       Discharge Plan and Services                                     Social Determinants of Health (SDOH) Interventions     Readmission Risk Interventions No flowsheet data found.

## 2021-05-16 NOTE — TOC Progression Note (Signed)
Transition of Care [facility]) - Progression Note    Patient Details  Name: Tommy Peterson MRN: [id number] Date of Birth: 03/18/1941  [facility] CM/SW Contact  Ross Ludwig, [place] Phone Number: 05/16/2021, 1:46 PM  Clinical Narrative:     CSW spoke to Freddie Breech at [facility].  Per Octavia Bruckner patient's family have already reached out to him, and [facility] screened patient in May.  At that time patient was not ready for hospice services per Tim.  Today patient's family reached out to him again, and patient is open to considering hospice services now.  Patient stated he wanted to speak to hospice agency to see what services they can provide for him.  CSW asked if Octavia Bruckner can meet with patient and family, he stated he will meet with them this afternoon.  Per physician if patient and hospice agency are in agreement patient can discharge today.  CSW follow up with [facility] to confirm patient in agreement.        Expected Discharge Plan and Services                                                 Social Determinants of Health (SDOH) Interventions    Readmission Risk Interventions No flowsheet data found.

## 2021-05-16 NOTE — Progress Notes (Signed)
Palliative care brief progress note  Chart reviewed and discussed with LCSW.  Plan is for discharge home with hospice services through [facility].  As goal clear for discharge home with hospice and discharge imminent, will hold on palliative consult.    Please call or reconsult if there are specific palliative needs with which our team can be of assistance.  Micheline Rough, MD [facility] Team [phone]  NO CHARGE NOTE

## 2021-05-16 NOTE — Progress Notes (Signed)
Patient transfused 1 unit PRBC's as per order for Hgb 6.3 on day shift. Given current blood shortage. Order obtained from provider on call for H/H post transfusion. Patient currently asymptomatic, VSS. Will continue to monitor and follow H/H,

## 2021-05-17 ENCOUNTER — Other Ambulatory Visit: Payer: Self-pay

## 2021-05-17 ENCOUNTER — Ambulatory Visit (INDEPENDENT_AMBULATORY_CARE_PROVIDER_SITE_OTHER): Payer: No Typology Code available for payment source

## 2021-05-17 DIAGNOSIS — C91Z0 Other lymphoid leukemia not having achieved remission: Secondary | ICD-10-CM

## 2021-05-17 DIAGNOSIS — I480 Paroxysmal atrial fibrillation: Secondary | ICD-10-CM

## 2021-05-17 LAB — LEGIONELLA PNEUMOPHILA SEROGP 1 UR AG: L. pneumophila Serogp 1 Ur Ag: NEGATIVE

## 2021-05-17 LAB — CUP PACEART REMOTE DEVICE CHECK
Date Time Interrogation Session: 20221010212656
Implantable Pulse Generator Implant Date: 20190904

## 2021-05-18 ENCOUNTER — Inpatient Hospital Stay

## 2021-05-18 ENCOUNTER — Inpatient Hospital Stay: Admitting: Hematology

## 2021-05-18 ENCOUNTER — Other Ambulatory Visit: Payer: No Typology Code available for payment source

## 2021-05-18 ENCOUNTER — Telehealth: Payer: Self-pay | Admitting: Hematology

## 2021-05-18 NOTE — Telephone Encounter (Signed)
Scheduled per 10/12 in basket, attempted to call pt, was unable to leave message

## 2021-05-19 ENCOUNTER — Inpatient Hospital Stay (HOSPITAL_BASED_OUTPATIENT_CLINIC_OR_DEPARTMENT_OTHER): Admitting: Hematology

## 2021-05-19 ENCOUNTER — Other Ambulatory Visit (HOSPITAL_COMMUNITY): Payer: Self-pay

## 2021-05-19 ENCOUNTER — Inpatient Hospital Stay

## 2021-05-19 ENCOUNTER — Other Ambulatory Visit: Payer: Self-pay

## 2021-05-19 VITALS — BP 147/68 | HR 65 | Temp 97.9°F | Resp 17 | Ht 70.0 in | Wt 159.9 lb

## 2021-05-19 DIAGNOSIS — C91Z0 Other lymphoid leukemia not having achieved remission: Secondary | ICD-10-CM

## 2021-05-19 DIAGNOSIS — C91Z2 Other lymphoid leukemia, in relapse: Secondary | ICD-10-CM | POA: Diagnosis present

## 2021-05-19 DIAGNOSIS — D649 Anemia, unspecified: Secondary | ICD-10-CM

## 2021-05-19 DIAGNOSIS — D72829 Elevated white blood cell count, unspecified: Secondary | ICD-10-CM | POA: Diagnosis not present

## 2021-05-19 LAB — CBC WITH DIFFERENTIAL (CANCER CENTER ONLY)
Abs Immature Granulocytes: 0.19 10*3/uL — ABNORMAL HIGH (ref 0.00–0.07)
Basophils Absolute: 0 10*3/uL (ref 0.0–0.1)
Basophils Relative: 0 %
Eosinophils Absolute: 0 10*3/uL (ref 0.0–0.5)
Eosinophils Relative: 0 %
HCT: 21.6 % — ABNORMAL LOW (ref 39.0–52.0)
Hemoglobin: 7.3 g/dL — ABNORMAL LOW (ref 13.0–17.0)
Immature Granulocytes: 2 %
Lymphocytes Relative: 50 %
Lymphs Abs: 5.6 10*3/uL — ABNORMAL HIGH (ref 0.7–4.0)
MCH: 29.6 pg (ref 26.0–34.0)
MCHC: 33.8 g/dL (ref 30.0–36.0)
MCV: 87.4 fL (ref 80.0–100.0)
Monocytes Absolute: 1 10*3/uL (ref 0.1–1.0)
Monocytes Relative: 9 %
Neutro Abs: 4.4 10*3/uL (ref 1.7–7.7)
Neutrophils Relative %: 39 %
Platelet Count: 315 10*3/uL (ref 150–400)
RBC: 2.47 MIL/uL — ABNORMAL LOW (ref 4.22–5.81)
RDW: 15.2 % (ref 11.5–15.5)
WBC Count: 11.2 10*3/uL — ABNORMAL HIGH (ref 4.0–10.5)
nRBC: 0 % (ref 0.0–0.2)

## 2021-05-19 LAB — CMP (CANCER CENTER ONLY)
ALT: 60 U/L — ABNORMAL HIGH (ref 0–44)
AST: 21 U/L (ref 15–41)
Albumin: 3.2 g/dL — ABNORMAL LOW (ref 3.5–5.0)
Alkaline Phosphatase: 98 U/L (ref 38–126)
Anion gap: 4 — ABNORMAL LOW (ref 5–15)
BUN: 14 mg/dL (ref 8–23)
CO2: 29 mmol/L (ref 22–32)
Calcium: 9 mg/dL (ref 8.9–10.3)
Chloride: 102 mmol/L (ref 98–111)
Creatinine: 0.49 mg/dL — ABNORMAL LOW (ref 0.61–1.24)
GFR, Estimated: >60 mL/min (ref >60)
Glucose, Bld: 295 mg/dL — ABNORMAL HIGH (ref 70–99)
Potassium: 4 mmol/L (ref 3.5–5.1)
Sodium: 135 mmol/L (ref 135–145)
Total Bilirubin: 0.8 mg/dL (ref 0.3–1.2)
Total Protein: 6.2 g/dL — ABNORMAL LOW (ref 6.5–8.1)

## 2021-05-19 LAB — CULTURE, BLOOD (ROUTINE X 2)
Culture: NO GROWTH
Special Requests: ADEQUATE

## 2021-05-19 LAB — SAMPLE TO BLOOD BANK

## 2021-05-25 ENCOUNTER — Inpatient Hospital Stay: Admitting: Hematology

## 2021-05-25 ENCOUNTER — Inpatient Hospital Stay

## 2021-05-25 NOTE — Progress Notes (Signed)
Carelink Summary Report / Loop Recorder 

## 2021-05-26 NOTE — Addendum Note (Signed)
Addended by: Sullivan Lone on: 05/26/2021 09:57 AM   Modules accepted: Level of Service

## 2021-05-26 NOTE — Progress Notes (Addendum)
HEMATOLOGY/ONCOLOGY CLINIC NOTE  Date of Service: .05/19/2021  Patient Care Team: [name], DO as PCP - General (Family Medicine) Croitoru, Dani Gobble, MD as PCP - Cardiology (Cardiology) Collier Bullock, MD as Referring Physician (Hematology and Oncology) [name], DO as Referring Physician (Family Medicine)  CHIEF COMPLAINTS/PURPOSE OF CONSULTATION:  Large Granular Lymphocytic Leukemia  Hem/Onc History  1)- chronic leukocytosis (neutrophilia and absolute lymphocytosis), peripheral blood lymphocytes about 5000, about 1/4 of cells have LGL morphology 2)- macrocytic anemia at least since 09/08/2013 (normal CBC on 11/28/2012), MCV now as high as 115, low retics, marrow M:E ratio 15:1 in 05/2016, 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 weekly -03/29/2017 increase dose 25 mg po qweek -07/19/2017 increase mtx 30 mg po qweek, stable dose since then   HISTORY OF PRESENTING ILLNESS:  Tommy Peterson is a wonderful 80 y.o. male who has been referred to [place] by Dr [name] for evaluation and management of Large Granular Lymphocytic Leukemia. The pt reports that he is doing well overall.   Pt is here as he is moving his care because his previous Oncologist, Dr. [name], has changed practices. The pt reports that he needed 2 pints of blood every 2 weeks for a year beginning in 2014. Pt has never needed to any  erythropoeitin injections. His Hgb has been holding higher lately, around 10-11. It has been about 7 months since pt has needed his last blood transfusion. Pt has been taking 30 mg of Methotrexate once per week and denies any issues with that dosage. He has continued taking 1 mg Folic Acid once per day. He is also taking a daily multivitamin.    Pt was previously walking a lot but had to slow down due to fatigue. Pt will need a surgery to repair his left inguinal hernia.    In 2017 pt had a C. Diff infection. Pt had recurrent UTI infections in 2019. His last hospitalization for infections was in January 2020. There were no kidney stones or other reasons for repeat infections. Pt denies any pneumonia infections. Pt has had Diabetes for 40+ years and it is currently well controlled. Pt was never given a direct reason for chronic diarrhea but was placed on Budesonide which stopped his symptoms. Pt is currently taking 3 mg twice per week. He notes that he has been experiencing some urinary frequency, but does feel like he is completely emptying his bladder when he uses the restroom. Pt has seen a Urologist who gave him some medication that caused him to urinate up to 30 times per day. He has tried Flomax previously, and still has some at home, but notes that it does not help much. Pt is following with a Dermatologist , Dr. Rhona Raider, for his skin Squamous Cell Carcinoma. He is currently using Efudex to treat. He was having some night sweats about a year ago, but has had none recently.    Most recent lab results (08/28/2019) of CBC w/diff  and CMP is as follows: all values are WNL except for Hgb at 11.8, HCT at 35.1, MCV at 122, MCH at 41.1, RDW at 2.87, RDW at 16.0, Abs Immature Granulocyte at 0.11K, Immature Granulocyte Rel at 1.2, Mono Abs at 1.0K, Creatinine at 0.5, Glucose at 199.   On review of systems, pt reports constipation, fatigue and denies diarrhea, unexpected weight loss, fevers, chills, night  sweats, abdominal pain, leg swelling and any other symptoms.    On PMHx the pt reports BPH, Chronic Diarrhea, Chronic Large Granular Lymphocytic Leukemia, Collagenous Collitis, Clostridium defficile colitis, Chronic UTIs, Transfusion-dependant anemia, Type 2 diabetes mellitus.  INTERVAL HISTORY:   Tommy Peterson is a wonderful 80 y.o. male who is here for evaluation and management of Large Granular Lymphocytic Leukemia. The patient's last visit with [place] was on 04/25/2021.  He is here with 2 of his daughters to discuss goals of care in the context of his worsening clinical status. Since his last visit with [place] he has been admitted to the hospital on 2 occasions-once on 05/06/2021 with severe urinary tract infection and metabolic encephalopathy.  He was again readmitted on 05/14/2021 with sepsis due to possible healthcare acquired pneumonia.  He has been off his p.o. Cytoxan for most of this time.  Has continued to require significant PRBC transfusion support.  He has had worsening of his functional status.  His daughters are concerned that he is not able to function at home and will not be able to maintain the energy to have appropriate clinic follow-up. Patient notes he would not want to continue Cytoxan or any other specific T-LGL treatments at this time.  We discussed alternative options including possible use of Jakafi.  Patient has already been placed on hospice upon discharge from the hospital.  He wants to continue this.  We had a long goals of care discussion regarding the therapeutic approach with T-cell directed therapies with ongoing transfusion support with continued risk of recurrent infections and destabilization versus his previous choice upon hospital discharge is going with hospice and best supportive care approach.  At this time the patient wants to hold off on any specific treatments but would like to continue weekly transfusions till he determines his final decision after  discussion with the rest of his family.  His daughter called [place] a few days later saying the patient wanted to cancel all his appointments like to pursue hospice and the supportive cares and hold off on transfusion support.  On review of systems, pt reports  no fevers/chills/night sweats.  No new cough.  No urinary symptoms.  SURGICAL HISTORY: Past Surgical History:  Procedure Laterality Date   BONE MARROW BIOPSY Right 05-24-2016;  08-20-2014   CARDIOVASCULAR STRESS TEST  11-26-2012   [name]  @ [facility] ([place]   normal nuclear study w/ no ischemia/  normal LV function and wall motion , ef 60% (find in care everywhere,epic)   CARDIOVERSION N/A 08/31/2020   Procedure: CARDIOVERSION;  Surgeon: [name], MD;  Location: [facility];  Service: Cardiovascular;  Laterality: N/A;   CATARACT EXTRACTION W/ INTRAOCULAR LENS  IMPLANT, BILATERAL  2015   CYSTOSCOPY WITH LITHOLAPAXY N/A 12/25/2017   Procedure: CYSTOSCOPY WITH LITHOLAPAXY AND FULGERATION;  Surgeon: [name], MD;  Location: [facility];  Service: Urology;  Laterality: N/A;   INGUINAL HERNIA REPAIR Right 1980s   LOOP RECORDER INSERTION N/A 04/10/2018   Procedure: LOOP RECORDER INSERTION;  Surgeon: [name], MD;  Location: [facility];  Service: Cardiovascular;  Laterality: N/A;   PENILE PROSTHESIS IMPLANT  12-02-2015   dr [name] @ [facility] in [place] Left 08/2016   TEE WITHOUT CARDIOVERSION N/A 08/31/2020   Procedure: TRANSESOPHAGEAL ECHOCARDIOGRAM (TEE);  Surgeon: [name], MD;  Location: [place];  Service: Cardiovascular;  Laterality: N/A;   TONSILLECTOMY  child   TRANSURETHRAL RESECTION OF PROSTATE  2009   AND REPAIR RECURRENT RIGHT INGUINAL HERNIA    SOCIAL HISTORY: Social History   Socioeconomic History   Marital status: Widowed    Spouse name: engaged   Number of children: 8   Years of education: college-2   Highest education  level: Not on file  Occupational History   Occupation: Press photographer   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  Tobacco Use   Smoking status: Former    Years: 20.00    Types: Cigarettes    Quit date: 05/04/1996    Years since quitting: 25.0   Smokeless tobacco: Never  Vaping Use   Vaping Use: Never used  Substance and Sexual Activity   Alcohol use: Yes    Alcohol/week: 1.0 standard drink    Types: 1 Glasses of wine per week    Comment: 1-2 glasses of wine every other day   Drug use: No   Sexual activity: Never  Other Topics Concern   Not on file  Social History Narrative   Not on file   Social Determinants of Health   Financial Resource Strain: Not on file  Food Insecurity: Not on file  Transportation Needs: Not on file  Physical Activity: Not on file  Stress: Not on file  Social Connections: Not on file  Intimate Partner Violence: Not on file    FAMILY HISTORY: Family History  Problem Relation Age of Onset   Diabetes Mother    Heart failure Mother     ALLERGIES:  is allergic to bee pollen, bee venom, metformin and related, sulfamethoxazole-trimethoprim, and metformin hcl.  MEDICATIONS:  Current Outpatient Medications  Medication Sig Dispense Refill   acetaminophen (TYLENOL) 325 MG tablet Take 650 mg by mouth 2 (two) times daily as needed for fever.     amiodarone (PACERONE) 200 MG tablet Take 1 tablet (200 mg total) by mouth daily. (Patient taking differently: Take 200 mg by mouth in the morning.) 90 tablet 3   aspirin 325 MG EC tablet Take 325 mg by mouth every Monday, Wednesday, and Friday.     B Complex Vitamins (B COMPLEX 100 PO) Take 1 tablet by mouth 2 (two) times daily.     BD PEN NEEDLE NANO 2ND GEN 32G X 4 MM MISC      budesonide (ENTOCORT EC) 3 MG 24 hr capsule Take 9 mg by mouth in the morning.     Chromium-Cinnamon (CINNAMON PLUS CHROMIUM PO) Take 2 tablets by mouth in the morning and at bedtime.     Continuous Blood Gluc Sensor (FREESTYLE LIBRE 2 SENSOR) MISC  Inject 1 patch into the skin every 14 (fourteen) days.     Cranberry 400 MG TABS Take 400 mg by mouth 2 (two) times daily.     Cyanocobalamin (VITAMIN B-12) 5000 MCG SUBL Place 5,000 mcg under the tongue in the morning.     cyclophosphamide (CYTOXAN) 50 MG capsule Take 1 capsule (50 mg total) by mouth daily. Take with food to minimize GI upset. Take early in the day and maintain hydration. 30 capsule 1   D-Mannose 500 MG CAPS Take 500  mg by mouth in the morning and at bedtime.     doxycycline (VIBRAMYCIN) 100 MG capsule Take 1 capsule (100 mg total) by mouth at bedtime. After completion of augmentin antibiotic     folic acid (FOLVITE) 1 MG tablet TAKE 1 TABLET BY MOUTH EVERY DAY (Patient taking differently: Take 1 mg by mouth daily with breakfast.) 90 tablet 3   metoprolol tartrate (LOPRESSOR) 50 MG tablet Take 50 mg by mouth in the morning and at bedtime.     Misc Natural Products (PROSTATE HEALTH) CAPS Take 1 capsule by mouth daily with breakfast.     NOVOLOG FLEXPEN 100 UNIT/ML FlexPen Inject 1-15 Units into the skin 3 (three) times daily with meals. Glucose 121 - 150: 2 units, Glucose 151 - 200: 3 units, Glucose 201 - 250: 5 units, Glucose 251 - 300: 8 units, Glucose 301 - 350: 11 units, Glucose 351 - 400: 15 units, Glucose > 400 call MD (Patient taking differently: Inject 1-15 Units into the skin 3 (three) times daily with meals. Inject 1-15 units into the skin three times a day with meals: BGL 121-150 = 2 units; 151-200 = 3 units; 201-250 = 5 units; 251-300 = 8 units; 301-350 = 11 units; 351-400 = 15 units; >400 = call MD) 15 mL 11   omeprazole (PRILOSEC) 40 MG capsule Take 40 mg by mouth 2 (two) times daily.     oxybutynin (DITROPAN) 5 MG tablet Take 5 mg by mouth in the morning.     pioglitazone (ACTOS) 15 MG tablet Take 15 mg by mouth every morning.     senna-docusate (SENOKOT-S) 8.6-50 MG tablet Take 1 tablet by mouth at bedtime as needed for mild constipation. 60 tablet 0   SOLIQUA 100-33  UNT-MCG/ML SOPN Inject 23 Units into the skin at bedtime.     No current facility-administered medications for this visit.    REVIEW OF SYSTEMS:   .10 Point review of Systems was done is negative except as noted above.  PHYSICAL EXAMINATION: ECOG PERFORMANCE STATUS: 1 - Symptomatic but completely ambulatory NAD . GENERAL:alert, in no acute distress and comfortable SKIN: no acute rashes, no significant lesions EYES: conjunctiva are pink and non-injected, sclera anicteric OROPHARYNX: MMM, no exudates, no oropharyngeal erythema or ulceration NECK: supple, no JVD LYMPH:  no palpable lymphadenopathy in the cervical, axillary or inguinal regions LUNGS: clear to auscultation b/l with normal respiratory effort HEART: regular rate & rhythm ABDOMEN:  normoactive bowel sounds , non tender, not distended. Extremity: no pedal edema PSYCH: alert & oriented x 3 with fluent speech NEURO: no focal motor/sensory deficits     LABORATORY DATA:  I have reviewed the data as listed  CBC Latest Ref Rng & Units 05/19/2021 05/16/2021 05/16/2021  WBC 4.0 - 10.5 K/uL 11.2(H) 7.9 -  Hemoglobin 13.0 - 17.0 g/dL 7.3(L) 7.0(L) 7.2(L)  Hematocrit 39.0 - 52.0 % 21.6(L) 20.3(L) 20.9(L)  Platelets 150 - 400 K/uL 315 223 -    CMP Latest Ref Rng & Units 05/19/2021 05/16/2021 05/15/2021  Glucose 70 - 99 mg/dL 295(H) 159(H) 141(H)  BUN 8 - 23 mg/dL _0 Creatinine 0.61 - 1.24 mg/dL 0.49(L) 0.30(L) 0.39(L)  Sodium 135 - 145 mmol/L 135 141 137  Potassium 3.5 - 5.1 mmol/L 4.0 3.3(L) 2.7(LL)  Chloride 98 - 111 mmol/L 102 110 104  CO2 22 - 32 mmol/L _1 Calcium 8.9 - 10.3 mg/dL 9.0 8.5(L) 8.3(L)  Total Protein 6.5 - 8.1 g/dL 6.2(L) - 4.8(L)  Total Bilirubin 0.3 - 1.2 mg/dL 0.8 - 0.6  Alkaline Phos 38 - 126 U/L 98 - 69  AST 15 - 41 U/L 21 - 21  ALT 0 - 44 U/L 60(H) - 43   Surgical Pathology  CASE: [id number]  PATIENT: Tommy Peterson  Bone Marrow Report      Clinical History: T cell large  granular lymphocytic leukemia , Iliac  bone marrow aspirate and core biopsy , ([facility])      DIAGNOSIS:   BONE MARROW, ASPIRATE, CLOT, CORE:  -Hypercellular bone marrow with marked erythroid hypoplasia  -Granulocytic and megakaryocytic hyperplasia  -Several lymphoid aggregates present  -See comment   PERIPHERAL BLOOD:  -Normocytic-normochromic anemia  -Slight leukocytosis   COMMENT:   In the presence of previously known T-cell large granular lymphocytic  leukemia, the findings in the peripheral blood with relative abundance  of large granular lymphocytes in addition to relative abundance of CD8  positive T-cells in the bone marrow as particularly seen by flow  cytometry are concerning for persistent/residual disease process  although the changes are not considered entirely specific.  Nonetheless,  correlation with cytogenetic and molecular studies is recommended.  The  background shows profound erythroid hypoplasia possibly related to the  known lymphoproliferative process, thymoma, drug therapy, infection,  etc.  There is also generally nonspecific granulocytic and  megakaryocytic hyperplasia.  There is no definitive or diagnostic  evidence of a plasma cell neoplasm.  Clinical correlation is strongly  recommended    09/28/2016 Flow Cytometry:   RADIOGRAPHIC STUDIES: I have personally reviewed the radiological images as listed and agreed with the findings in the report. DG Chest 2 View  Result Date: 05/14/2021 CLINICAL DATA:  Sepsis EXAM: CHEST - 2 VIEW COMPARISON:  May 06, 2021 FINDINGS: The heart size and mediastinal contours are stable. Consolidation of the left mid and left lung base are identified. Right lung is clear. There is no pulmonary edema. The visualized skeletal structures are unremarkable. IMPRESSION: Left lung pneumonia. Electronically Signed   By: Abelardo Diesel M.D.   On: 05/14/2021 14:24   CT HEAD WO CONTRAST (5MM)  Result Date: 05/06/2021 CLINICAL  DATA:  Confusion, altered level of consciousness EXAM: CT HEAD WITHOUT CONTRAST TECHNIQUE: Contiguous axial images were obtained from the base of the skull through the vertex without intravenous contrast. COMPARISON:  01/21/2021 FINDINGS: Brain: No acute infarct or hemorrhage. Lateral ventricles and midline structures are unremarkable. No acute extra-axial fluid collections. No mass effect. Vascular: No hyperdense vessel or unexpected calcification. Skull: Normal. Negative for fracture or focal lesion. Sinuses/Orbits: No acute finding. Other: None. IMPRESSION: 1. Stable head CT, no acute intracranial process. Electronically Signed   By: Randa Ngo M.D.   On: 05/06/2021 19:01   DG Chest Portable 1 View  Result Date: 05/06/2021 CLINICAL DATA:  Altered level of consciousness, confusion EXAM: PORTABLE CHEST 1 VIEW COMPARISON:  04/03/2021 FINDINGS: Single frontal view of the chest demonstrates a stable cardiac silhouette. There is chronic central vascular congestion and interstitial prominence, without focal airspace disease, effusion, or pneumothorax. No acute bony abnormalities. IMPRESSION: 1. Stable chest, no acute process. Electronically Signed   By: Randa Ngo M.D.   On: 05/06/2021 18:50   CUP PACEART REMOTE DEVICE CHECK  Result Date: 05/17/2021 ILR summary report received. Battery status OK. Normal device function. No new symptom, brady, or pause episodes. No new AF episodes. 1 tachy episode in past month, EGM shows oversensing as seen in previous reports. Not on anticoagulation due to anemia and  falls. Monthly summary reports and ROV/PRN  CT MAXILLOFACIAL WO CONTRAST  Result Date: 05/08/2021 CLINICAL DATA:  Maxillofacial dental pain, question abscess EXAM: CT MAXILLOFACIAL WITHOUT CONTRAST TECHNIQUE: Multidetector CT imaging of the maxillofacial structures was performed. Multiplanar CT image reconstructions were also generated. COMPARISON:  Head CT from 2 days ago FINDINGS: Osseous: No notable  tooth devitalization or periapical erosion. Negative for fracture or aggressive bone lesion. Orbits: Bilateral cataract resection. No acute or inflammatory finding Sinuses: Overall mild patchy mucosal thickening which is generalized. Greatest mucosal thickening in the left frontal ethmoidal region with frontoethmoidal recess effacement. Soft tissues: No visible inflammation.  Atheromatous calcification Limited intracranial: Negative IMPRESSION: 1. No visible abscess or odontogenic infection. 2. Sinus mucosal thickening with left frontal ethmoidal recess obstruction. Electronically Signed   By: Jorje Guild M.D.   On: 05/08/2021 07:00   DG FLUORO GUIDE LUMBAR PUNCTURE  Result Date: 05/07/2021 CLINICAL DATA:  Patient for diagnostic lumbar puncture. EXAM: DIAGNOSTIC LUMBAR PUNCTURE UNDER FLUOROSCOPIC GUIDANCE COMPARISON:  [facility] FLUOROSCOPY TIME:  Fluoroscopy Time:  30 seconds Radiation Exposure Index (if provided by the fluoroscopic device): 6.5 mGy Number of Acquired Spot Images: 5 PROCEDURE: Informed consent was obtained from the patient prior to the procedure, including potential complications of headache, allergy, and pain. With the patient prone, the lower back was prepped with Betadine. 1% Lidocaine was used for local anesthesia. Lumbar puncture was performed at the L4-5 level using a 20 gauge needle with return of clear CSF. 7 ml of CSF were obtained for laboratory studies. The patient tolerated the procedure well and there were no apparent complications. IMPRESSION: Successful lumbar puncture. Electronically Signed   By: Lovey Newcomer M.D.   On: 05/07/2021 15:34     ASSESSMENT & PLAN:   80 yo with   1) T cell large granular leukemia s/p transfusion dependent anemia - previous blood transfusion dependent . Refractory to methotrexate 2) IgG Lambda MGUS 3) recent COVID-19 infection 4) recent hospitalization for sepsis due to urinary tract infection with metabolic encephalopathy followed by sepsis  due to healthcare acquired pneumonia. 5) declining performance status ECOG PS 3 PLAN: -Had a detailed discussion with the patient and his accompanying daughters regarding goals of care. -In light of his declining performance status in the context of's to potentially life-threatening infections he has not really been on Cytoxan much since his last clinic visit with [place] about a month ago.  He was discharged on home hospice upon discharge from the hospital with his last admission. -We discussed pros and cons of a therapeutic approach would involve continued use of Cytoxan with risk of infections versus switching to third line therapy with Jakafi and monitoring labs weekly with transfusion support as indicated.  This will lead to increased risk of infections and hospitalization significant burden for follow-up at the cancer center.  The alternative option of continuing his home hospice as per current discharge plan from the hospital and best supportive cares through this. -The patient noted he is feeling overwhelmed with this decision making.  His daughters are very supportive.  He requests time to ponder over this with his family and then make a final decision. -Patient's daughter subsequently called and informed us that the patient would prefer to continue his home hospice and would like to hold off on any additional T-LGL treatments or transfusion support.  4) RLE swelling [place] venosu ordered and done - reviewed - neg for DVT. Continue f/u with PCP  FOLLOW UP: Patient has decided to pursue home  hospice with best supportive cares no additional transfusion support.  . The total time spent in the appointment was 45 minutes and more than 50% was on counseling and direct patient cares.  All of the patient's questions were answered with apparent satisfaction. The patient knows to call the clinic with any problems, questions or concerns.   Sullivan Lone MD MS AAHIVMS [facility] Hematology/Oncology  Physician [facility].

## 2021-05-28 LAB — CULTURE, FUNGUS WITHOUT SMEAR

## 2021-06-01 ENCOUNTER — Inpatient Hospital Stay

## 2021-06-06 ENCOUNTER — Other Ambulatory Visit (HOSPITAL_COMMUNITY): Payer: Self-pay

## 2021-06-08 ENCOUNTER — Other Ambulatory Visit: Payer: No Typology Code available for payment source

## 2021-06-09 ENCOUNTER — Other Ambulatory Visit (HOSPITAL_COMMUNITY): Payer: Self-pay

## 2021-06-15 ENCOUNTER — Ambulatory Visit: Payer: Medicare HMO | Admitting: Neurology

## 2021-06-20 ENCOUNTER — Ambulatory Visit (INDEPENDENT_AMBULATORY_CARE_PROVIDER_SITE_OTHER): Payer: Medicare HMO

## 2021-06-20 DIAGNOSIS — I480 Paroxysmal atrial fibrillation: Secondary | ICD-10-CM | POA: Diagnosis not present

## 2021-06-20 LAB — CUP PACEART REMOTE DEVICE CHECK
Date Time Interrogation Session: 20221112202919
Implantable Pulse Generator Implant Date: 20190904

## 2021-06-26 DIAGNOSIS — Z743 Need for continuous supervision: Secondary | ICD-10-CM | POA: Diagnosis not present

## 2021-06-27 NOTE — Progress Notes (Signed)
Carelink Summary Report / Loop Recorder 

## 2021-07-07 ENCOUNTER — Ambulatory Visit: Payer: No Typology Code available for payment source | Admitting: Cardiovascular Disease

## 2021-07-07 DIAGNOSIS — 419620001 Death: Secondary | SNOMED CT | POA: Diagnosis not present

## 2021-07-07 DEATH — deceased
